# Patient Record
Sex: Male | Born: 1965
Health system: Southern US, Community
[De-identification: ages and names within clinical notes are randomized; demographics above are authoritative.]

## PROBLEM LIST (undated history)

## (undated) DIAGNOSIS — D132 Benign neoplasm of duodenum: Secondary | ICD-10-CM

## (undated) DIAGNOSIS — Z9989 Dependence on other enabling machines and devices: Secondary | ICD-10-CM

## (undated) DIAGNOSIS — I639 Cerebral infarction, unspecified: Secondary | ICD-10-CM

## (undated) DIAGNOSIS — K219 Gastro-esophageal reflux disease without esophagitis: Secondary | ICD-10-CM

## (undated) DIAGNOSIS — F32A Depression, unspecified: Secondary | ICD-10-CM

## (undated) DIAGNOSIS — M1990 Unspecified osteoarthritis, unspecified site: Secondary | ICD-10-CM

## (undated) DIAGNOSIS — F419 Anxiety disorder, unspecified: Secondary | ICD-10-CM

## (undated) DIAGNOSIS — F329 Major depressive disorder, single episode, unspecified: Secondary | ICD-10-CM

## (undated) DIAGNOSIS — J45909 Unspecified asthma, uncomplicated: Secondary | ICD-10-CM

## (undated) DIAGNOSIS — R519 Headache, unspecified: Secondary | ICD-10-CM

## (undated) DIAGNOSIS — R7303 Prediabetes: Secondary | ICD-10-CM

## (undated) DIAGNOSIS — I10 Essential (primary) hypertension: Secondary | ICD-10-CM

## (undated) DIAGNOSIS — J189 Pneumonia, unspecified organism: Secondary | ICD-10-CM

## (undated) DIAGNOSIS — K759 Inflammatory liver disease, unspecified: Secondary | ICD-10-CM

## (undated) DIAGNOSIS — M19172 Post-traumatic osteoarthritis, left ankle and foot: Secondary | ICD-10-CM

## (undated) DIAGNOSIS — F209 Schizophrenia, unspecified: Secondary | ICD-10-CM

## (undated) DIAGNOSIS — Z96651 Presence of right artificial knee joint: Secondary | ICD-10-CM

## (undated) HISTORY — PX: JOINT REPLACEMENT: SHX530

## (undated) HISTORY — PX: UPPER GASTROINTESTINAL ENDOSCOPY: SHX188

## (undated) HISTORY — DX: Benign neoplasm of duodenum: D13.2

## (undated) HISTORY — PX: BACK SURGERY: SHX140

## (undated) NOTE — *Deleted (*Deleted)
[facility]  Date of Admission:  01/06/2020      Total days of antibiotics ***            ASSESSMENT: [name] is a 22 y.o. male with    PLAN: 1. ***  Principal Problem:   Acute osteomyelitis of lumbar spine (HCC) Active Problems:   Cocaine abuse (HCC)   Benign essential HTN   History of CVA (cerebrovascular accident)   Chronic pain syndrome   Closed fracture of one rib of left side   Discitis of lumbar region   . benztropine  1 mg Oral Daily  . buPROPion  300 mg Oral Daily  . cyclobenzaprine  5 mg Oral TID  . ferrous sulfate  325 mg Oral Q breakfast  . gabapentin  300 mg Oral BID  . losartan  100 mg Oral Daily  . mirtazapine  15 mg Oral QHS  . pantoprazole  40 mg Oral Daily  . sodium chloride flush  10-40 mL Intracatheter Q12H  . sodium chloride flush  3 mL Intravenous Q12H  . vitamin B-12  100 mcg Oral Daily    SUBJECTIVE: Still with bad back pain.    Review of Systems: ROS  Allergies  Allergen Reactions  . Chlorhexidine Itching and Rash    OBJECTIVE: Vitals:   01/10/20 1726 01/10/20 2120 01/11/20 0457 01/11/20 0922  BP: (!) 133/92 121/85 (!) 125/91 (!) 131/92  Pulse: (!) 110 (!) 105 (!) 101 100  Resp: 20 18 18 19   Temp: 99.1 F (37.3 C) 98.2 F (36.8 C) 97.9 F (36.6 C) 98.4 F (36.9 C)  TempSrc: Oral Oral Oral Oral  SpO2: 100% 98% 99% 99%  Weight:      Height:       Body mass index is 27.78 kg/m.  Physical Exam  Lab Results Lab Results  Component Value Date   WBC 7.5 01/10/2020   HGB 10.8 (L) 01/10/2020   HCT 33.6 (L) 01/10/2020   MCV 83.4 01/10/2020   PLT 397 01/10/2020    Lab Results  Component Value Date   CREATININE 0.79 01/10/2020   BUN 8 01/10/2020   NA 135 01/10/2020   K 3.7 01/10/2020   CL 102 01/10/2020   CO2 24 01/10/2020    Lab Results  Component Value Date   ALT 8 01/07/2020   AST 29 01/07/2020   ALKPHOS 98 01/07/2020   BILITOT 0.8 01/07/2020      Microbiology: Recent Results (from the past 240 hour(s))  Respiratory Panel by RT PCR (Flu A&B, Covid) - Nasopharyngeal Swab     Status: None   Collection Time: 01/07/20  3:09 AM   Specimen: Nasopharyngeal Swab  Result Value Ref Range Status   SARS Coronavirus 2 by RT PCR NEGATIVE NEGATIVE Final    Comment: (NOTE) SARS-CoV-2 target nucleic acids are NOT DETECTED.  The SARS-CoV-2 RNA is generally detectable in upper respiratoy specimens during the acute phase of infection. The lowest concentration of SARS-CoV-2 viral copies this assay can detect is 131 copies/mL. A negative result does not preclude SARS-Cov-2 infection and should not be used as the sole basis for treatment or other patient management decisions. A negative result may occur with  improper specimen collection/handling, submission of specimen other than nasopharyngeal swab, presence of viral mutation(s) within the areas targeted by this assay, and inadequate number of viral copies (<131 copies/mL). A negative result must be combined with clinical observations, patient history,  and epidemiological information. The expected result is Negative.  Fact Sheet for Patients:  https://www.moore.com/  Fact Sheet for Healthcare Providers:  https://www.young.biz/  This test is no t yet approved or cleared by the [place] FDA and  has been authorized for detection and/or diagnosis of SARS-CoV-2 by FDA under an Emergency Use Authorization (EUA). This EUA will remain  in effect (meaning this test can be used) for the duration of the COVID-19 declaration under Section 564(b)(1) of the Act, 21 U.S.C. section 360bbb-3(b)(1), unless the authorization is terminated or revoked sooner.     Influenza A by PCR NEGATIVE NEGATIVE Final   Influenza B by PCR NEGATIVE NEGATIVE Final    Comment: (NOTE) The Xpert Xpress SARS-CoV-2/FLU/RSV assay is intended as an aid in  the diagnosis of influenza  from Nasopharyngeal swab specimens and  should not be used as a sole basis for treatment. Nasal washings and  aspirates are unacceptable for Xpert Xpress SARS-CoV-2/FLU/RSV  testing.  Fact Sheet for Patients: https://www.moore.com/  Fact Sheet for Healthcare Providers: https://www.young.biz/  This test is not yet approved or cleared by the [place] FDA and  has been authorized for detection and/or diagnosis of SARS-CoV-2 by  FDA under an Emergency Use Authorization (EUA). This EUA will remain  in effect (meaning this test can be used) for the duration of the  Covid-19 declaration under Section 564(b)(1) of the Act, 21  U.S.C. section 360bbb-3(b)(1), unless the authorization is  terminated or revoked. Performed at [facility], [address], [place], [place] [address]   Culture, blood (routine x 2)     Status: None (Preliminary result)   Collection Time: 01/08/20  9:10 AM   Specimen: BLOOD  Result Value Ref Range Status   Specimen Description BLOOD RIGHT ANTECUBITAL  Final   Special Requests   Final    BOTTLES DRAWN AEROBIC AND ANAEROBIC Blood Culture adequate volume   Culture   Final    NO GROWTH 2 DAYS Performed at [facility], [address], [place], [place] [address]    Report Status PENDING  Incomplete  Culture, blood (routine x 2)     Status: None (Preliminary result)   Collection Time: 01/08/20  9:13 AM   Specimen: BLOOD  Result Value Ref Range Status   Specimen Description BLOOD LEFT ANTECUBITAL  Final   Special Requests   Final    BOTTLES DRAWN AEROBIC AND ANAEROBIC Blood Culture adequate volume   Culture   Final    NO GROWTH 2 DAYS Performed at [facility], [address] [place], [place] [address]    Report Status PENDING  Incomplete  Body fluid culture     Status: None (Preliminary result)   Collection Time: 01/08/20  3:01 PM   Specimen: Body Fluid  Result Value Ref Range Status   Specimen  Description FLUID  Final   Special Requests DISC L1  Final   Gram Stain   Final    ABUNDANT WBC PRESENT,BOTH PMN AND MONONUCLEAR NO ORGANISMS SEEN    Culture   Final    NO GROWTH 3 DAYS Performed at [facility], [address], [place], [place] [address]    Report Status PENDING  Incomplete  Anaerobic culture     Status: None (Preliminary result)   Collection Time: 01/08/20  3:01 PM   Specimen: Fluid  Result Value Ref Range Status   Specimen Description FLUID  Final   Special Requests   Final  DISC L1 Performed at [facility], [address], [place], [place] [address]    Culture   Final    NO ANAEROBES ISOLATED; CULTURE IN PROGRESS FOR 5 DAYS   Report Status PENDING  Incomplete    Rexene Alberts, MSN, NP-C [facility]  [email or web address] Pager: [phone] Office: [phone] [facility] Main Line: [phone]

---
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
## 2007-10-22 ENCOUNTER — Inpatient Hospital Stay (HOSPITAL_COMMUNITY): Admission: EM | Admit: 2007-10-22 | Discharge: 2007-10-23 | Payer: Self-pay | Admitting: Emergency Medicine

## 2007-11-04 ENCOUNTER — Emergency Department (HOSPITAL_COMMUNITY): Admission: EM | Admit: 2007-11-04 | Discharge: 2007-11-05 | Payer: Self-pay | Admitting: Emergency Medicine

## 2007-12-11 ENCOUNTER — Emergency Department (HOSPITAL_COMMUNITY): Admission: EM | Admit: 2007-12-11 | Discharge: 2007-12-11 | Payer: Self-pay | Admitting: Emergency Medicine

## 2007-12-23 ENCOUNTER — Emergency Department (HOSPITAL_COMMUNITY): Admission: EM | Admit: 2007-12-23 | Discharge: 2007-12-24 | Payer: Self-pay | Admitting: Emergency Medicine

## 2008-01-05 ENCOUNTER — Emergency Department (HOSPITAL_COMMUNITY): Admission: EM | Admit: 2008-01-05 | Discharge: 2008-01-05 | Payer: Self-pay | Admitting: Emergency Medicine

## 2008-03-03 ENCOUNTER — Other Ambulatory Visit: Payer: Self-pay | Admitting: Emergency Medicine

## 2008-03-03 ENCOUNTER — Other Ambulatory Visit: Payer: Self-pay

## 2008-03-03 ENCOUNTER — Ambulatory Visit: Payer: Self-pay | Admitting: Psychiatry

## 2008-03-03 ENCOUNTER — Inpatient Hospital Stay (HOSPITAL_COMMUNITY): Admission: AD | Admit: 2008-03-03 | Discharge: 2008-03-05 | Payer: Self-pay | Admitting: Psychiatry

## 2008-03-10 ENCOUNTER — Other Ambulatory Visit: Payer: Self-pay

## 2008-03-11 ENCOUNTER — Inpatient Hospital Stay (HOSPITAL_COMMUNITY): Admission: AD | Admit: 2008-03-11 | Discharge: 2008-03-11 | Payer: Self-pay | Admitting: Psychiatry

## 2008-03-13 ENCOUNTER — Emergency Department (HOSPITAL_COMMUNITY): Admission: EM | Admit: 2008-03-13 | Discharge: 2008-03-13 | Payer: Self-pay | Admitting: Emergency Medicine

## 2008-03-27 ENCOUNTER — Emergency Department (HOSPITAL_COMMUNITY): Admission: EM | Admit: 2008-03-27 | Discharge: 2008-03-27 | Payer: Self-pay | Admitting: Emergency Medicine

## 2008-04-15 ENCOUNTER — Other Ambulatory Visit: Payer: Self-pay | Admitting: Emergency Medicine

## 2008-04-16 ENCOUNTER — Other Ambulatory Visit: Payer: Self-pay | Admitting: Emergency Medicine

## 2008-04-16 ENCOUNTER — Inpatient Hospital Stay (HOSPITAL_COMMUNITY): Admission: AD | Admit: 2008-04-16 | Discharge: 2008-04-26 | Payer: Self-pay | Admitting: Psychiatry

## 2008-04-16 ENCOUNTER — Ambulatory Visit: Payer: Self-pay | Admitting: Psychiatry

## 2008-04-23 ENCOUNTER — Encounter: Payer: Self-pay | Admitting: Emergency Medicine

## 2008-05-04 ENCOUNTER — Other Ambulatory Visit: Payer: Self-pay | Admitting: *Deleted

## 2008-05-04 ENCOUNTER — Inpatient Hospital Stay (HOSPITAL_COMMUNITY): Admission: AD | Admit: 2008-05-04 | Discharge: 2008-05-18 | Payer: Self-pay | Admitting: Psychiatry

## 2008-05-10 ENCOUNTER — Encounter: Payer: Self-pay | Admitting: Emergency Medicine

## 2008-05-11 ENCOUNTER — Other Ambulatory Visit: Payer: Self-pay | Admitting: Emergency Medicine

## 2008-05-12 ENCOUNTER — Other Ambulatory Visit: Payer: Self-pay | Admitting: Emergency Medicine

## 2008-05-22 ENCOUNTER — Emergency Department (HOSPITAL_BASED_OUTPATIENT_CLINIC_OR_DEPARTMENT_OTHER): Admission: EM | Admit: 2008-05-22 | Discharge: 2008-05-22 | Payer: Self-pay | Admitting: Emergency Medicine

## 2008-08-05 ENCOUNTER — Ambulatory Visit: Payer: Self-pay | Admitting: Internal Medicine

## 2008-08-06 ENCOUNTER — Ambulatory Visit: Payer: Self-pay | Admitting: Internal Medicine

## 2008-08-06 ENCOUNTER — Inpatient Hospital Stay (HOSPITAL_COMMUNITY): Admission: EM | Admit: 2008-08-06 | Discharge: 2008-08-09 | Payer: Self-pay | Admitting: Emergency Medicine

## 2008-08-07 ENCOUNTER — Encounter (INDEPENDENT_AMBULATORY_CARE_PROVIDER_SITE_OTHER): Payer: Self-pay | Admitting: Internal Medicine

## 2008-08-07 ENCOUNTER — Encounter: Payer: Self-pay | Admitting: Internal Medicine

## 2009-05-06 ENCOUNTER — Emergency Department (HOSPITAL_COMMUNITY): Admission: EM | Admit: 2009-05-06 | Discharge: 2009-05-07 | Payer: Self-pay | Admitting: Emergency Medicine

## 2009-06-05 ENCOUNTER — Emergency Department (HOSPITAL_COMMUNITY): Admission: EM | Admit: 2009-06-05 | Discharge: 2009-06-05 | Payer: Self-pay | Admitting: Family Medicine

## 2009-06-29 ENCOUNTER — Emergency Department (HOSPITAL_COMMUNITY): Admission: EM | Admit: 2009-06-29 | Discharge: 2009-06-30 | Payer: Self-pay | Admitting: Emergency Medicine

## 2009-08-10 ENCOUNTER — Emergency Department (HOSPITAL_COMMUNITY): Admission: EM | Admit: 2009-08-10 | Discharge: 2009-08-11 | Payer: Self-pay | Admitting: Emergency Medicine

## 2009-08-21 ENCOUNTER — Emergency Department (HOSPITAL_COMMUNITY): Admission: EM | Admit: 2009-08-21 | Discharge: 2009-08-22 | Payer: Self-pay | Admitting: Emergency Medicine

## 2009-08-22 ENCOUNTER — Emergency Department (HOSPITAL_COMMUNITY): Admission: EM | Admit: 2009-08-22 | Discharge: 2009-08-22 | Payer: Self-pay | Admitting: Emergency Medicine

## 2009-08-27 ENCOUNTER — Emergency Department (HOSPITAL_COMMUNITY): Admission: EM | Admit: 2009-08-27 | Discharge: 2009-08-27 | Payer: Self-pay | Admitting: Family Medicine

## 2009-08-27 ENCOUNTER — Emergency Department (HOSPITAL_COMMUNITY): Admission: EM | Admit: 2009-08-27 | Discharge: 2009-08-28 | Payer: Self-pay | Admitting: Emergency Medicine

## 2009-10-14 ENCOUNTER — Emergency Department (HOSPITAL_COMMUNITY): Admission: EM | Admit: 2009-10-14 | Discharge: 2009-10-14 | Payer: Self-pay | Admitting: Emergency Medicine

## 2009-11-15 ENCOUNTER — Emergency Department (HOSPITAL_COMMUNITY): Admission: EM | Admit: 2009-11-15 | Discharge: 2009-11-15 | Payer: Self-pay | Admitting: Emergency Medicine

## 2009-12-10 ENCOUNTER — Emergency Department (HOSPITAL_COMMUNITY): Admission: EM | Admit: 2009-12-10 | Discharge: 2009-12-10 | Payer: Self-pay | Admitting: Emergency Medicine

## 2009-12-14 ENCOUNTER — Emergency Department (HOSPITAL_COMMUNITY): Admission: EM | Admit: 2009-12-14 | Discharge: 2009-12-14 | Payer: Self-pay | Admitting: Emergency Medicine

## 2010-02-28 ENCOUNTER — Inpatient Hospital Stay (HOSPITAL_COMMUNITY)
Admission: EM | Admit: 2010-02-28 | Discharge: 2010-03-03 | Disposition: A | Discharge status: Other Acute Inpatient Hospital | Payer: Self-pay | Source: Home / Self Care | Attending: Internal Medicine | Admitting: Internal Medicine

## 2010-03-06 ENCOUNTER — Emergency Department (HOSPITAL_COMMUNITY): Admission: EM | Admit: 2010-03-06 | Discharge: 2009-09-12 | Payer: Self-pay | Admitting: Emergency Medicine

## 2010-04-22 ENCOUNTER — Emergency Department (HOSPITAL_COMMUNITY)
Admission: EM | Admit: 2010-04-22 | Discharge: 2010-04-23 | Payer: Self-pay | Source: Home / Self Care | Admitting: Emergency Medicine

## 2010-04-23 LAB — DIFFERENTIAL
Basophils Absolute: 0 10*3/uL (ref 0.0–0.1)
Basophils Relative: 1 % (ref 0–1)
Eosinophils Absolute: 0.3 10*3/uL (ref 0.0–0.7)
Eosinophils Relative: 6 % — ABNORMAL HIGH (ref 0–5)
Monocytes Absolute: 0.4 10*3/uL (ref 0.1–1.0)
Monocytes Relative: 8 % (ref 3–12)
Neutro Abs: 2.5 10*3/uL (ref 1.7–7.7)

## 2010-04-23 LAB — COMPREHENSIVE METABOLIC PANEL
ALT: <8 U/L (ref 0–53)
AST: 19 U/L (ref 0–37)
Albumin: 3.4 g/dL — ABNORMAL LOW (ref 3.5–5.2)
CO2: 21 mEq/L (ref 19–32)
Calcium: 8.4 mg/dL (ref 8.4–10.5)
Creatinine, Ser: 1.01 mg/dL (ref 0.4–1.5)
GFR calc Af Amer: >60 mL/min (ref >60)
GFR calc non Af Amer: >60 mL/min (ref >60)
Sodium: 135 mEq/L (ref 135–145)
Total Protein: 5.9 g/dL — ABNORMAL LOW (ref 6.0–8.3)

## 2010-04-23 LAB — CK TOTAL AND CKMB (NOT AT ARMC)
CK, MB: 4.9 ng/mL — ABNORMAL HIGH (ref 0.3–4.0)
Relative Index: 1.7 (ref 0.0–2.5)

## 2010-04-23 LAB — CBC
MCH: 31.2 pg (ref 26.0–34.0)
MCHC: 34.1 g/dL (ref 30.0–36.0)
Platelets: 303 10*3/uL (ref 150–400)
RDW: 15 % (ref 11.5–15.5)

## 2010-04-23 LAB — RAPID URINE DRUG SCREEN, HOSP PERFORMED
Opiates: NOT DETECTED
Tetrahydrocannabinol: NOT DETECTED

## 2010-04-23 LAB — TROPONIN I: Troponin I: 0.01 ng/mL (ref 0.00–0.06)

## 2010-04-25 ENCOUNTER — Emergency Department (HOSPITAL_COMMUNITY)
Admission: EM | Admit: 2010-04-25 | Discharge: 2010-04-26 | Payer: Self-pay | Source: Home / Self Care | Admitting: Emergency Medicine

## 2010-04-25 LAB — PROTIME-INR
INR: 0.93 (ref 0.00–1.49)
Prothrombin Time: 12.7 seconds (ref 11.6–15.2)

## 2010-04-25 LAB — CBC
HCT: 45.1 % (ref 39.0–52.0)
Hemoglobin: 15.8 g/dL (ref 13.0–17.0)
MCH: 31.7 pg (ref 26.0–34.0)
RBC: 4.98 MIL/uL (ref 4.22–5.81)

## 2010-04-25 LAB — COMPREHENSIVE METABOLIC PANEL
ALT: <8 U/L (ref 0–53)
AST: 31 U/L (ref 0–37)
Alkaline Phosphatase: 73 U/L (ref 39–117)
CO2: 23 mEq/L (ref 19–32)
Calcium: 8.4 mg/dL (ref 8.4–10.5)
GFR calc Af Amer: >60 mL/min (ref >60)
Potassium: 3.8 mEq/L (ref 3.5–5.1)
Sodium: 135 mEq/L (ref 135–145)
Total Protein: 7.5 g/dL (ref 6.0–8.3)

## 2010-04-25 LAB — DIFFERENTIAL
Basophils Absolute: 0.1 10*3/uL (ref 0.0–0.1)
Basophils Relative: 1 % (ref 0–1)
Lymphocytes Relative: 19 % (ref 12–46)
Monocytes Relative: 4 % (ref 3–12)
Neutro Abs: 6.3 10*3/uL (ref 1.7–7.7)
Neutrophils Relative %: 75 % (ref 43–77)

## 2010-04-25 LAB — URINALYSIS, ROUTINE W REFLEX MICROSCOPIC
Ketones, ur: NEGATIVE mg/dL
Nitrite: NEGATIVE
Protein, ur: NEGATIVE mg/dL
Urobilinogen, UA: 0.2 mg/dL (ref 0.0–1.0)

## 2010-04-25 LAB — RAPID URINE DRUG SCREEN, HOSP PERFORMED
Benzodiazepines: NOT DETECTED
Cocaine: NOT DETECTED
Tetrahydrocannabinol: NOT DETECTED

## 2010-05-08 ENCOUNTER — Inpatient Hospital Stay (INDEPENDENT_AMBULATORY_CARE_PROVIDER_SITE_OTHER)
Admission: RE | Admit: 2010-05-08 | Discharge: 2010-05-08 | Disposition: A | Discharge status: Home / Self Care | Payer: Medicare FFS | Source: Ambulatory Visit | Attending: Family Medicine | Admitting: Family Medicine

## 2010-05-08 DIAGNOSIS — S0180XA Unspecified open wound of other part of head, initial encounter: Secondary | ICD-10-CM

## 2010-05-15 ENCOUNTER — Encounter (HOSPITAL_COMMUNITY): Payer: Self-pay | Admitting: Radiology

## 2010-05-15 ENCOUNTER — Emergency Department (HOSPITAL_COMMUNITY): Payer: Medicare Other

## 2010-05-15 ENCOUNTER — Emergency Department (HOSPITAL_COMMUNITY)
Admission: EM | Admit: 2010-05-15 | Discharge: 2010-05-15 | Disposition: A | Discharge status: Home / Self Care | Payer: Medicare Other | Attending: Emergency Medicine | Admitting: Emergency Medicine

## 2010-05-15 DIAGNOSIS — S0990XA Unspecified injury of head, initial encounter: Secondary | ICD-10-CM | POA: Insufficient documentation

## 2010-05-15 DIAGNOSIS — Z8673 Personal history of transient ischemic attack (TIA), and cerebral infarction without residual deficits: Secondary | ICD-10-CM | POA: Insufficient documentation

## 2010-05-15 DIAGNOSIS — R29898 Other symptoms and signs involving the musculoskeletal system: Secondary | ICD-10-CM | POA: Insufficient documentation

## 2010-05-15 DIAGNOSIS — W19XXXA Unspecified fall, initial encounter: Secondary | ICD-10-CM | POA: Insufficient documentation

## 2010-05-15 DIAGNOSIS — I10 Essential (primary) hypertension: Secondary | ICD-10-CM | POA: Insufficient documentation

## 2010-05-15 DIAGNOSIS — Z8659 Personal history of other mental and behavioral disorders: Secondary | ICD-10-CM | POA: Insufficient documentation

## 2010-05-15 DIAGNOSIS — H6120 Impacted cerumen, unspecified ear: Secondary | ICD-10-CM | POA: Insufficient documentation

## 2010-05-15 DIAGNOSIS — T148XXA Other injury of unspecified body region, initial encounter: Secondary | ICD-10-CM | POA: Insufficient documentation

## 2010-05-15 DIAGNOSIS — F1010 Alcohol abuse, uncomplicated: Secondary | ICD-10-CM | POA: Insufficient documentation

## 2010-05-15 DIAGNOSIS — Z79899 Other long term (current) drug therapy: Secondary | ICD-10-CM | POA: Insufficient documentation

## 2010-05-15 DIAGNOSIS — J329 Chronic sinusitis, unspecified: Secondary | ICD-10-CM | POA: Insufficient documentation

## 2010-05-15 DIAGNOSIS — Y9210 Unspecified residential institution as the place of occurrence of the external cause: Secondary | ICD-10-CM | POA: Insufficient documentation

## 2010-05-15 DIAGNOSIS — F3130 Bipolar disorder, current episode depressed, mild or moderate severity, unspecified: Secondary | ICD-10-CM | POA: Insufficient documentation

## 2010-05-15 HISTORY — DX: Essential (primary) hypertension: I10

## 2010-05-15 HISTORY — DX: Cerebral infarction, unspecified: I63.9

## 2010-05-15 LAB — CBC
Hemoglobin: 13.3 g/dL (ref 13.0–17.0)
MCH: 31 pg (ref 26.0–34.0)
MCHC: 33.8 g/dL (ref 30.0–36.0)
RDW: 15 % (ref 11.5–15.5)

## 2010-05-15 LAB — DIFFERENTIAL
Basophils Relative: 0 % (ref 0–1)
Eosinophils Absolute: 0.1 10*3/uL (ref 0.0–0.7)
Eosinophils Relative: 3 % (ref 0–5)
Monocytes Absolute: 0.2 10*3/uL (ref 0.1–1.0)
Monocytes Relative: 5 % (ref 3–12)
Neutro Abs: 3.4 10*3/uL (ref 1.7–7.7)

## 2010-05-15 LAB — COMPREHENSIVE METABOLIC PANEL
AST: 28 U/L (ref 0–37)
CO2: 20 mEq/L (ref 19–32)
Calcium: 8.1 mg/dL — ABNORMAL LOW (ref 8.4–10.5)
Creatinine, Ser: 0.74 mg/dL (ref 0.4–1.5)
GFR calc Af Amer: >60 mL/min (ref >60)
GFR calc non Af Amer: >60 mL/min (ref >60)

## 2010-05-15 LAB — GLUCOSE, CAPILLARY

## 2010-05-15 LAB — ETHANOL: Alcohol, Ethyl (B): 281 mg/dL — ABNORMAL HIGH (ref 0–10)

## 2010-05-26 ENCOUNTER — Other Ambulatory Visit (HOSPITAL_COMMUNITY): Payer: Self-pay | Admitting: Family Medicine

## 2010-05-26 DIAGNOSIS — D381 Neoplasm of uncertain behavior of trachea, bronchus and lung: Secondary | ICD-10-CM

## 2010-05-28 ENCOUNTER — Ambulatory Visit (HOSPITAL_COMMUNITY): Admission: RE | Admit: 2010-05-28 | Payer: Medicare FFS | Source: Ambulatory Visit

## 2010-06-10 LAB — CBC
MCH: 31.1 pg (ref 26.0–34.0)
MCHC: 33.9 g/dL (ref 30.0–36.0)
MCV: 90.6 fL (ref 78.0–100.0)
MCV: 91.9 fL (ref 78.0–100.0)
Platelets: 254 10*3/uL (ref 150–400)
Platelets: 263 10*3/uL (ref 150–400)
RBC: 4.08 MIL/uL — ABNORMAL LOW (ref 4.22–5.81)
RBC: 4.24 MIL/uL (ref 4.22–5.81)
RDW: 15 % (ref 11.5–15.5)
WBC: 4.9 10*3/uL (ref 4.0–10.5)

## 2010-06-10 LAB — COMPREHENSIVE METABOLIC PANEL
ALT: 11 U/L (ref 0–53)
ALT: 12 U/L (ref 0–53)
AST: 33 U/L (ref 0–37)
AST: 47 U/L — ABNORMAL HIGH (ref 0–37)
Albumin: 3.5 g/dL (ref 3.5–5.2)
Albumin: 3.6 g/dL (ref 3.5–5.2)
Albumin: 3.8 g/dL (ref 3.5–5.2)
Alkaline Phosphatase: 67 U/L (ref 39–117)
BUN: 8 mg/dL (ref 6–23)
BUN: 9 mg/dL (ref 6–23)
CO2: 23 mEq/L (ref 19–32)
Calcium: 8.4 mg/dL (ref 8.4–10.5)
Calcium: 8.8 mg/dL (ref 8.4–10.5)
Chloride: 105 mEq/L (ref 96–112)
Chloride: 111 mEq/L (ref 96–112)
Creatinine, Ser: 0.71 mg/dL (ref 0.4–1.5)
Creatinine, Ser: 0.82 mg/dL (ref 0.4–1.5)
GFR calc Af Amer: >60 mL/min (ref >60)
GFR calc Af Amer: >60 mL/min (ref >60)
GFR calc Af Amer: >60 mL/min (ref >60)
GFR calc non Af Amer: >60 mL/min (ref >60)
Potassium: 3.7 mEq/L (ref 3.5–5.1)
Sodium: 138 mEq/L (ref 135–145)
Sodium: 141 mEq/L (ref 135–145)
Total Bilirubin: 0.4 mg/dL (ref 0.3–1.2)
Total Bilirubin: 0.4 mg/dL (ref 0.3–1.2)
Total Protein: 6.5 g/dL (ref 6.0–8.3)

## 2010-06-10 LAB — URINALYSIS, ROUTINE W REFLEX MICROSCOPIC
Bilirubin Urine: NEGATIVE
Ketones, ur: NEGATIVE mg/dL
Nitrite: NEGATIVE
Specific Gravity, Urine: 1.005 (ref 1.005–1.030)
Urobilinogen, UA: 0.2 mg/dL (ref 0.0–1.0)

## 2010-06-10 LAB — DIFFERENTIAL
Basophils Absolute: 0 10*3/uL (ref 0.0–0.1)
Basophils Relative: 1 % (ref 0–1)
Eosinophils Relative: 4 % (ref 0–5)
Monocytes Absolute: 0.4 10*3/uL (ref 0.1–1.0)
Monocytes Relative: 8 % (ref 3–12)
Neutro Abs: 2.6 10*3/uL (ref 1.7–7.7)

## 2010-06-10 LAB — ETHANOL
Alcohol, Ethyl (B): 446 mg/dL (ref 0–10)
Alcohol, Ethyl (B): 5 mg/dL (ref 0–10)

## 2010-06-10 LAB — RAPID URINE DRUG SCREEN, HOSP PERFORMED: Tetrahydrocannabinol: NOT DETECTED

## 2010-06-10 LAB — TRICYCLICS SCREEN, URINE: TCA Scrn: NOT DETECTED

## 2010-06-10 LAB — PHOSPHORUS: Phosphorus: 5.4 mg/dL — ABNORMAL HIGH (ref 2.3–4.6)

## 2010-06-10 LAB — ACETAMINOPHEN LEVEL: Acetaminophen (Tylenol), Serum: <10 ug/mL — ABNORMAL LOW (ref 10–30)

## 2010-06-12 LAB — RAPID URINE DRUG SCREEN, HOSP PERFORMED
Barbiturates: NOT DETECTED
Barbiturates: NOT DETECTED
Benzodiazepines: NOT DETECTED
Cocaine: NOT DETECTED
Opiates: NOT DETECTED
Opiates: NOT DETECTED

## 2010-06-12 LAB — DIFFERENTIAL
Basophils Relative: 1 % (ref 0–1)
Basophils Relative: 1 % (ref 0–1)
Eosinophils Absolute: 0.2 10*3/uL (ref 0.0–0.7)
Eosinophils Absolute: 0.2 10*3/uL (ref 0.0–0.7)
Lymphs Abs: 1.6 10*3/uL (ref 0.7–4.0)
Monocytes Absolute: 0.4 10*3/uL (ref 0.1–1.0)
Monocytes Relative: 6 % (ref 3–12)
Neutrophils Relative %: 62 % (ref 43–77)

## 2010-06-12 LAB — CBC
HCT: 39 % (ref 39.0–52.0)
Hemoglobin: 13.5 g/dL (ref 13.0–17.0)
MCH: 32.5 pg (ref 26.0–34.0)
MCH: 32.5 pg (ref 26.0–34.0)
MCHC: 34.3 g/dL (ref 30.0–36.0)
MCHC: 34.5 g/dL (ref 30.0–36.0)
Platelets: 226 10*3/uL (ref 150–400)
RBC: 4.08 MIL/uL — ABNORMAL LOW (ref 4.22–5.81)

## 2010-06-12 LAB — COMPREHENSIVE METABOLIC PANEL
Albumin: 4.2 g/dL (ref 3.5–5.2)
Alkaline Phosphatase: 71 U/L (ref 39–117)
BUN: 5 mg/dL — ABNORMAL LOW (ref 6–23)
CO2: 23 mEq/L (ref 19–32)
Chloride: 104 mEq/L (ref 96–112)
GFR calc non Af Amer: >60 mL/min (ref >60)
Potassium: 4.1 mEq/L (ref 3.5–5.1)
Total Bilirubin: 0.2 mg/dL — ABNORMAL LOW (ref 0.3–1.2)

## 2010-06-12 LAB — URINALYSIS, ROUTINE W REFLEX MICROSCOPIC
Bilirubin Urine: NEGATIVE
Glucose, UA: NEGATIVE mg/dL
Hgb urine dipstick: NEGATIVE
Ketones, ur: NEGATIVE mg/dL
Protein, ur: NEGATIVE mg/dL

## 2010-06-12 LAB — POCT I-STAT, CHEM 8
Calcium, Ion: 1.02 mmol/L — ABNORMAL LOW (ref 1.12–1.32)
Chloride: 106 mEq/L (ref 96–112)
Glucose, Bld: 118 mg/dL — ABNORMAL HIGH (ref 70–99)
HCT: 41 % (ref 39.0–52.0)
Hemoglobin: 13.9 g/dL (ref 13.0–17.0)

## 2010-06-12 LAB — ETHANOL: Alcohol, Ethyl (B): 164 mg/dL — ABNORMAL HIGH (ref 0–10)

## 2010-06-12 LAB — BASIC METABOLIC PANEL
CO2: 21 mEq/L (ref 19–32)
Glucose, Bld: 100 mg/dL — ABNORMAL HIGH (ref 70–99)
Potassium: 4 mEq/L (ref 3.5–5.1)
Sodium: 138 mEq/L (ref 135–145)

## 2010-06-13 LAB — CBC
HCT: 34.5 % — ABNORMAL LOW (ref 39.0–52.0)
Hemoglobin: 12 g/dL — ABNORMAL LOW (ref 13.0–17.0)
MCH: 32.2 pg (ref 26.0–34.0)
MCHC: 34.8 g/dL (ref 30.0–36.0)
RBC: 3.73 MIL/uL — ABNORMAL LOW (ref 4.22–5.81)

## 2010-06-13 LAB — TROPONIN I: Troponin I: 0.01 ng/mL (ref 0.00–0.06)

## 2010-06-13 LAB — DIFFERENTIAL
Basophils Absolute: 0 10*3/uL (ref 0.0–0.1)
Eosinophils Absolute: 0.1 10*3/uL (ref 0.0–0.7)
Eosinophils Relative: 1 % (ref 0–5)
Lymphocytes Relative: 25 % (ref 12–46)
Lymphs Abs: 1.6 10*3/uL (ref 0.7–4.0)
Neutrophils Relative %: 69 % (ref 43–77)

## 2010-06-13 LAB — URINALYSIS, ROUTINE W REFLEX MICROSCOPIC
Bilirubin Urine: NEGATIVE
Hgb urine dipstick: NEGATIVE
Ketones, ur: NEGATIVE mg/dL
Nitrite: NEGATIVE
pH: 5.5 (ref 5.0–8.0)

## 2010-06-13 LAB — COMPREHENSIVE METABOLIC PANEL
ALT: 14 U/L (ref 0–53)
AST: 31 U/L (ref 0–37)
CO2: 23 mEq/L (ref 19–32)
Calcium: 7.8 mg/dL — ABNORMAL LOW (ref 8.4–10.5)
Chloride: 106 mEq/L (ref 96–112)
Creatinine, Ser: 1.17 mg/dL (ref 0.4–1.5)
GFR calc non Af Amer: >60 mL/min (ref >60)
Glucose, Bld: 153 mg/dL — ABNORMAL HIGH (ref 70–99)
Total Bilirubin: 0.2 mg/dL — ABNORMAL LOW (ref 0.3–1.2)

## 2010-06-13 LAB — POCT CARDIAC MARKERS: Myoglobin, poc: 131 ng/mL (ref 12–200)

## 2010-06-13 LAB — CK TOTAL AND CKMB (NOT AT ARMC): Total CK: 240 U/L — ABNORMAL HIGH (ref 7–232)

## 2010-06-15 LAB — POCT I-STAT, CHEM 8
BUN: 10 mg/dL (ref 6–23)
Chloride: 104 mEq/L (ref 96–112)
HCT: 42 % (ref 39.0–52.0)
Potassium: 3.4 mEq/L — ABNORMAL LOW (ref 3.5–5.1)
Sodium: 136 mEq/L (ref 135–145)

## 2010-06-15 LAB — ETHANOL
Alcohol, Ethyl (B): 180 mg/dL — ABNORMAL HIGH (ref 0–10)
Alcohol, Ethyl (B): 280 mg/dL — ABNORMAL HIGH (ref 0–10)

## 2010-06-16 LAB — POCT I-STAT, CHEM 8
BUN: 4 mg/dL — ABNORMAL LOW (ref 6–23)
Calcium, Ion: 0.97 mmol/L — ABNORMAL LOW (ref 1.12–1.32)
Chloride: 108 mEq/L (ref 96–112)
HCT: 41 % (ref 39.0–52.0)
Potassium: 4.1 mEq/L (ref 3.5–5.1)
Sodium: 140 mEq/L (ref 135–145)

## 2010-06-16 LAB — POCT CARDIAC MARKERS
CKMB, poc: 6.3 ng/mL (ref 1.0–8.0)
Myoglobin, poc: 153 ng/mL (ref 12–200)
Troponin i, poc: <0.05 ng/mL (ref 0.00–0.09)

## 2010-07-08 LAB — CBC
HCT: 38.5 % — ABNORMAL LOW (ref 39.0–52.0)
Hemoglobin: 14.5 g/dL (ref 13.0–17.0)
MCHC: 33.7 g/dL (ref 30.0–36.0)
MCHC: 34 g/dL (ref 30.0–36.0)
MCHC: 34.1 g/dL (ref 30.0–36.0)
MCV: 85.4 fL (ref 78.0–100.0)
Platelets: 231 10*3/uL (ref 150–400)
Platelets: 231 10*3/uL (ref 150–400)
Platelets: 278 10*3/uL (ref 150–400)
RBC: 4.34 MIL/uL (ref 4.22–5.81)
RBC: 4.38 MIL/uL (ref 4.22–5.81)
RDW: 16.3 % — ABNORMAL HIGH (ref 11.5–15.5)
RDW: 16.8 % — ABNORMAL HIGH (ref 11.5–15.5)
WBC: 10.1 10*3/uL (ref 4.0–10.5)
WBC: 7.6 10*3/uL (ref 4.0–10.5)
WBC: 8.4 10*3/uL (ref 4.0–10.5)

## 2010-07-08 LAB — BASIC METABOLIC PANEL
BUN: 5 mg/dL — ABNORMAL LOW (ref 6–23)
BUN: 5 mg/dL — ABNORMAL LOW (ref 6–23)
CO2: 24 mEq/L (ref 19–32)
Calcium: 7.6 mg/dL — ABNORMAL LOW (ref 8.4–10.5)
Calcium: 8.3 mg/dL — ABNORMAL LOW (ref 8.4–10.5)
Calcium: 8.4 mg/dL (ref 8.4–10.5)
Calcium: 8.6 mg/dL (ref 8.4–10.5)
Chloride: 111 mEq/L (ref 96–112)
Creatinine, Ser: 0.67 mg/dL (ref 0.4–1.5)
Creatinine, Ser: 0.81 mg/dL (ref 0.4–1.5)
Creatinine, Ser: 0.83 mg/dL (ref 0.4–1.5)
Creatinine, Ser: 0.85 mg/dL (ref 0.4–1.5)
GFR calc Af Amer: >60 mL/min (ref >60)
GFR calc Af Amer: >60 mL/min (ref >60)
GFR calc Af Amer: >60 mL/min (ref >60)
GFR calc non Af Amer: >60 mL/min (ref >60)
GFR calc non Af Amer: >60 mL/min (ref >60)
Glucose, Bld: 89 mg/dL (ref 70–99)
Potassium: 4.4 mEq/L (ref 3.5–5.1)
Sodium: 139 mEq/L (ref 135–145)

## 2010-07-08 LAB — CULTURE, BLOOD (ROUTINE X 2): Culture: NO GROWTH

## 2010-07-08 LAB — CROSSMATCH
ABO/RH(D): O POS
Antibody Screen: NEGATIVE

## 2010-07-08 LAB — COMPREHENSIVE METABOLIC PANEL
ALT: 15 U/L (ref 0–53)
Albumin: 4 g/dL (ref 3.5–5.2)
Calcium: 8.6 mg/dL (ref 8.4–10.5)
Glucose, Bld: 116 mg/dL — ABNORMAL HIGH (ref 70–99)
Sodium: 140 mEq/L (ref 135–145)
Total Protein: 6.7 g/dL (ref 6.0–8.3)

## 2010-07-08 LAB — CLOTEST (H. PYLORI), BIOPSY: Helicobacter screen: POSITIVE — AB

## 2010-07-08 LAB — DIFFERENTIAL
Eosinophils Absolute: 0 10*3/uL (ref 0.0–0.7)
Lymphs Abs: 1.3 10*3/uL (ref 0.7–4.0)
Monocytes Absolute: 0.6 10*3/uL (ref 0.1–1.0)
Monocytes Relative: 5 % (ref 3–12)
Neutro Abs: 9.9 10*3/uL — ABNORMAL HIGH (ref 1.7–7.7)
Neutrophils Relative %: 84 % — ABNORMAL HIGH (ref 43–77)

## 2010-07-08 LAB — MAGNESIUM
Magnesium: 2 mg/dL (ref 1.5–2.5)
Magnesium: 2 mg/dL (ref 1.5–2.5)

## 2010-07-08 LAB — CK TOTAL AND CKMB (NOT AT ARMC)
CK, MB: 3.5 ng/mL (ref 0.3–4.0)
Relative Index: 0.6 (ref 0.0–2.5)
Relative Index: 0.7 (ref 0.0–2.5)
Relative Index: 0.7 (ref 0.0–2.5)
Total CK: 621 U/L — ABNORMAL HIGH (ref 7–232)

## 2010-07-08 LAB — ETHANOL: Alcohol, Ethyl (B): 193 mg/dL — ABNORMAL HIGH (ref 0–10)

## 2010-07-08 LAB — PROTIME-INR
INR: 1 (ref 0.00–1.49)
Prothrombin Time: 13.1 seconds (ref 11.6–15.2)

## 2010-07-08 LAB — TROPONIN I
Troponin I: 0.02 ng/mL (ref 0.00–0.06)
Troponin I: 0.05 ng/mL (ref 0.00–0.06)

## 2010-07-08 LAB — CK: Total CK: 265 U/L — ABNORMAL HIGH (ref 7–232)

## 2010-07-08 LAB — TSH: TSH: 1.459 u[IU]/mL (ref 0.350–4.500)

## 2010-07-14 LAB — COMPREHENSIVE METABOLIC PANEL
ALT: 13 U/L (ref 0–53)
AST: 49 U/L — ABNORMAL HIGH (ref 0–37)
Alkaline Phosphatase: 92 U/L (ref 39–117)
CO2: 22 mEq/L (ref 19–32)
Calcium: 8.4 mg/dL (ref 8.4–10.5)
Chloride: 105 mEq/L (ref 96–112)
GFR calc non Af Amer: >60 mL/min (ref >60)
Glucose, Bld: 95 mg/dL (ref 70–99)
Sodium: 137 mEq/L (ref 135–145)
Total Bilirubin: 0.7 mg/dL (ref 0.3–1.2)

## 2010-07-14 LAB — CBC
HCT: 41.8 % (ref 39.0–52.0)
Hemoglobin: 13.9 g/dL (ref 13.0–17.0)
Hemoglobin: 14.2 g/dL (ref 13.0–17.0)
MCHC: 32.8 g/dL (ref 30.0–36.0)
MCV: 86 fL (ref 78.0–100.0)
WBC: 8.1 10*3/uL (ref 4.0–10.5)

## 2010-07-14 LAB — URINALYSIS, ROUTINE W REFLEX MICROSCOPIC
Bilirubin Urine: NEGATIVE
Glucose, UA: NEGATIVE mg/dL
Glucose, UA: NEGATIVE mg/dL
Hgb urine dipstick: NEGATIVE
Ketones, ur: NEGATIVE mg/dL
Ketones, ur: NEGATIVE mg/dL
Nitrite: NEGATIVE
Protein, ur: NEGATIVE mg/dL
pH: 6 (ref 5.0–8.0)
pH: 6.5 (ref 5.0–8.0)

## 2010-07-14 LAB — POCT I-STAT, CHEM 8
BUN: 5 mg/dL — ABNORMAL LOW (ref 6–23)
Calcium, Ion: 1.02 mmol/L — ABNORMAL LOW (ref 1.12–1.32)
Creatinine, Ser: 0.9 mg/dL (ref 0.4–1.5)
Glucose, Bld: 90 mg/dL (ref 70–99)
Hemoglobin: 16 g/dL (ref 13.0–17.0)
TCO2: 25 mmol/L (ref 0–100)

## 2010-07-14 LAB — RAPID URINE DRUG SCREEN, HOSP PERFORMED
Amphetamines: NOT DETECTED
Barbiturates: NOT DETECTED
Benzodiazepines: NOT DETECTED
Benzodiazepines: POSITIVE — AB
Cocaine: NOT DETECTED

## 2010-07-14 LAB — DIFFERENTIAL
Basophils Absolute: 0.1 10*3/uL (ref 0.0–0.1)
Basophils Relative: 1 % (ref 0–1)
Eosinophils Absolute: 0.3 10*3/uL (ref 0.0–0.7)
Eosinophils Absolute: 0.3 10*3/uL (ref 0.0–0.7)
Eosinophils Relative: 3 % (ref 0–5)
Eosinophils Relative: 3 % (ref 0–5)
Lymphocytes Relative: 31 % (ref 12–46)
Lymphs Abs: 2.5 10*3/uL (ref 0.7–4.0)
Lymphs Abs: 2.5 10*3/uL (ref 0.7–4.0)
Monocytes Absolute: 0.5 10*3/uL (ref 0.1–1.0)
Monocytes Relative: 6 % (ref 3–12)
Neutrophils Relative %: 61 % (ref 43–77)

## 2010-07-14 LAB — ETHANOL
Alcohol, Ethyl (B): 285 mg/dL — ABNORMAL HIGH (ref 0–10)
Alcohol, Ethyl (B): 303 mg/dL — ABNORMAL HIGH (ref 0–10)

## 2010-07-15 ENCOUNTER — Emergency Department (HOSPITAL_COMMUNITY)
Admission: EM | Admit: 2010-07-15 | Discharge: 2010-07-15 | Disposition: A | Discharge status: Home / Self Care | Payer: Medicare FFS | Attending: Emergency Medicine | Admitting: Emergency Medicine

## 2010-07-15 DIAGNOSIS — Z79899 Other long term (current) drug therapy: Secondary | ICD-10-CM | POA: Insufficient documentation

## 2010-07-15 DIAGNOSIS — T1590XA Foreign body on external eye, part unspecified, unspecified eye, initial encounter: Secondary | ICD-10-CM | POA: Insufficient documentation

## 2010-07-15 DIAGNOSIS — F1010 Alcohol abuse, uncomplicated: Secondary | ICD-10-CM | POA: Insufficient documentation

## 2010-07-15 DIAGNOSIS — I10 Essential (primary) hypertension: Secondary | ICD-10-CM | POA: Insufficient documentation

## 2010-07-15 DIAGNOSIS — H11419 Vascular abnormalities of conjunctiva, unspecified eye: Secondary | ICD-10-CM | POA: Insufficient documentation

## 2010-07-15 DIAGNOSIS — H5789 Other specified disorders of eye and adnexa: Secondary | ICD-10-CM | POA: Insufficient documentation

## 2010-07-15 DIAGNOSIS — Z8673 Personal history of transient ischemic attack (TIA), and cerebral infarction without residual deficits: Secondary | ICD-10-CM | POA: Insufficient documentation

## 2010-07-15 DIAGNOSIS — Y929 Unspecified place or not applicable: Secondary | ICD-10-CM | POA: Insufficient documentation

## 2010-07-15 DIAGNOSIS — F3130 Bipolar disorder, current episode depressed, mild or moderate severity, unspecified: Secondary | ICD-10-CM | POA: Insufficient documentation

## 2010-07-15 DIAGNOSIS — S058X9A Other injuries of unspecified eye and orbit, initial encounter: Secondary | ICD-10-CM | POA: Insufficient documentation

## 2010-07-15 DIAGNOSIS — H538 Other visual disturbances: Secondary | ICD-10-CM | POA: Insufficient documentation

## 2010-07-15 DIAGNOSIS — H5710 Ocular pain, unspecified eye: Secondary | ICD-10-CM | POA: Insufficient documentation

## 2010-07-15 LAB — HEPATIC FUNCTION PANEL
ALT: 14 U/L (ref 0–53)
AST: 65 U/L — ABNORMAL HIGH (ref 0–37)
Albumin: 4 g/dL (ref 3.5–5.2)
Alkaline Phosphatase: 94 U/L (ref 39–117)
Bilirubin, Direct: <0.1 mg/dL (ref 0.0–0.3)
Total Bilirubin: 0.6 mg/dL (ref 0.3–1.2)
Total Protein: 6.5 g/dL (ref 6.0–8.3)

## 2010-07-15 LAB — URINALYSIS, ROUTINE W REFLEX MICROSCOPIC
Bilirubin Urine: NEGATIVE
Bilirubin Urine: NEGATIVE
Glucose, UA: NEGATIVE mg/dL
Glucose, UA: NEGATIVE mg/dL
Hgb urine dipstick: NEGATIVE
Hgb urine dipstick: NEGATIVE
Ketones, ur: NEGATIVE mg/dL
Nitrite: NEGATIVE
Protein, ur: NEGATIVE mg/dL
Specific Gravity, Urine: 1.001 — ABNORMAL LOW (ref 1.005–1.030)
Specific Gravity, Urine: 1.009 (ref 1.005–1.030)
Urobilinogen, UA: 0.2 mg/dL (ref 0.0–1.0)
Urobilinogen, UA: 0.2 mg/dL (ref 0.0–1.0)
pH: 6 (ref 5.0–8.0)
pH: 6 (ref 5.0–8.0)

## 2010-07-15 LAB — RAPID URINE DRUG SCREEN, HOSP PERFORMED
Barbiturates: NOT DETECTED
Barbiturates: NOT DETECTED
Benzodiazepines: POSITIVE — AB
Opiates: NOT DETECTED

## 2010-07-15 LAB — BASIC METABOLIC PANEL WITH GFR
BUN: 4 mg/dL — ABNORMAL LOW (ref 6–23)
CO2: 20 meq/L (ref 19–32)
Calcium: 8.8 mg/dL (ref 8.4–10.5)
Chloride: 109 meq/L (ref 96–112)
Creatinine, Ser: 1.06 mg/dL (ref 0.4–1.5)
GFR calc non Af Amer: >60 mL/min
Glucose, Bld: 106 mg/dL — ABNORMAL HIGH (ref 70–99)
Potassium: 4 meq/L (ref 3.5–5.1)
Sodium: 141 meq/L (ref 135–145)

## 2010-07-15 LAB — DIFFERENTIAL
Basophils Absolute: 0 10*3/uL (ref 0.0–0.1)
Basophils Absolute: 0.2 10*3/uL — ABNORMAL HIGH (ref 0.0–0.1)
Basophils Relative: 1 % (ref 0–1)
Basophils Relative: 2 % — ABNORMAL HIGH (ref 0–1)
Eosinophils Absolute: 0.1 10*3/uL (ref 0.0–0.7)
Eosinophils Absolute: 0.1 10*3/uL (ref 0.0–0.7)
Eosinophils Relative: 1 % (ref 0–5)
Lymphocytes Relative: 29 % (ref 12–46)
Lymphs Abs: 2.3 10*3/uL (ref 0.7–4.0)
Monocytes Absolute: 0.6 10*3/uL (ref 0.1–1.0)
Monocytes Relative: 8 % (ref 3–12)
Neutro Abs: 4.8 10*3/uL (ref 1.7–7.7)
Neutro Abs: 9.6 10*3/uL — ABNORMAL HIGH (ref 1.7–7.7)
Neutrophils Relative %: 62 % (ref 43–77)
Neutrophils Relative %: 77 % (ref 43–77)

## 2010-07-15 LAB — CBC
HCT: 36.6 % — ABNORMAL LOW (ref 39.0–52.0)
HCT: 41.6 % (ref 39.0–52.0)
Hemoglobin: 12.8 g/dL — ABNORMAL LOW (ref 13.0–17.0)
Hemoglobin: 13.8 g/dL (ref 13.0–17.0)
MCHC: 34.8 g/dL (ref 30.0–36.0)
MCV: 84.5 fL (ref 78.0–100.0)
Platelets: 336 10*3/uL (ref 150–400)
RBC: 4.33 MIL/uL (ref 4.22–5.81)
RBC: 4.78 MIL/uL (ref 4.22–5.81)
RDW: 16.3 % — ABNORMAL HIGH (ref 11.5–15.5)
WBC: 12.5 10*3/uL — ABNORMAL HIGH (ref 4.0–10.5)
WBC: 7.8 10*3/uL (ref 4.0–10.5)

## 2010-07-15 LAB — POCT I-STAT, CHEM 8
Chloride: 104 mEq/L (ref 96–112)
Creatinine, Ser: 1 mg/dL (ref 0.4–1.5)
Glucose, Bld: 117 mg/dL — ABNORMAL HIGH (ref 70–99)
HCT: 38 % — ABNORMAL LOW (ref 39.0–52.0)
Potassium: 3.7 mEq/L (ref 3.5–5.1)
Sodium: 139 mEq/L (ref 135–145)

## 2010-07-15 LAB — LIPASE, BLOOD: Lipase: 18 U/L (ref 11–59)

## 2010-07-15 LAB — COMPREHENSIVE METABOLIC PANEL
ALT: 18 U/L (ref 0–53)
Alkaline Phosphatase: 99 U/L (ref 39–117)
BUN: 9 mg/dL (ref 6–23)
CO2: 22 mEq/L (ref 19–32)
Chloride: 106 mEq/L (ref 96–112)
Glucose, Bld: 119 mg/dL — ABNORMAL HIGH (ref 70–99)
Potassium: 3.7 mEq/L (ref 3.5–5.1)
Sodium: 141 mEq/L (ref 135–145)
Total Bilirubin: 0.4 mg/dL (ref 0.3–1.2)

## 2010-07-15 LAB — ETHANOL
Alcohol, Ethyl (B): 271 mg/dL — ABNORMAL HIGH (ref 0–10)
Alcohol, Ethyl (B): <5 mg/dL (ref 0–10)

## 2010-07-15 LAB — SALICYLATE LEVEL: Salicylate Lvl: <4 mg/dL (ref 2.8–20.0)

## 2010-07-29 ENCOUNTER — Emergency Department (HOSPITAL_COMMUNITY)
Admission: EM | Admit: 2010-07-29 | Discharge: 2010-07-30 | Disposition: A | Discharge status: Home / Self Care | Payer: Medicare Other | Attending: Emergency Medicine | Admitting: Emergency Medicine

## 2010-07-29 DIAGNOSIS — K2920 Alcoholic gastritis without bleeding: Secondary | ICD-10-CM | POA: Insufficient documentation

## 2010-07-29 DIAGNOSIS — F319 Bipolar disorder, unspecified: Secondary | ICD-10-CM | POA: Insufficient documentation

## 2010-07-29 DIAGNOSIS — Z79899 Other long term (current) drug therapy: Secondary | ICD-10-CM | POA: Insufficient documentation

## 2010-07-29 DIAGNOSIS — Z8619 Personal history of other infectious and parasitic diseases: Secondary | ICD-10-CM | POA: Insufficient documentation

## 2010-07-29 DIAGNOSIS — R0682 Tachypnea, not elsewhere classified: Secondary | ICD-10-CM | POA: Insufficient documentation

## 2010-07-29 DIAGNOSIS — F209 Schizophrenia, unspecified: Secondary | ICD-10-CM | POA: Insufficient documentation

## 2010-07-29 DIAGNOSIS — I10 Essential (primary) hypertension: Secondary | ICD-10-CM | POA: Insufficient documentation

## 2010-07-29 DIAGNOSIS — F1020 Alcohol dependence, uncomplicated: Secondary | ICD-10-CM | POA: Insufficient documentation

## 2010-07-29 DIAGNOSIS — Z8673 Personal history of transient ischemic attack (TIA), and cerebral infarction without residual deficits: Secondary | ICD-10-CM | POA: Insufficient documentation

## 2010-07-29 DIAGNOSIS — F17200 Nicotine dependence, unspecified, uncomplicated: Secondary | ICD-10-CM | POA: Insufficient documentation

## 2010-07-29 DIAGNOSIS — F1010 Alcohol abuse, uncomplicated: Secondary | ICD-10-CM | POA: Insufficient documentation

## 2010-07-30 LAB — DIFFERENTIAL
Basophils Relative: 1 % (ref 0–1)
Eosinophils Absolute: 0.2 10*3/uL (ref 0.0–0.7)
Eosinophils Relative: 4 % (ref 0–5)
Lymphs Abs: 2.5 10*3/uL (ref 0.7–4.0)
Monocytes Relative: 7 % (ref 3–12)
Neutrophils Relative %: 46 % (ref 43–77)

## 2010-07-30 LAB — COMPREHENSIVE METABOLIC PANEL
ALT: 13 U/L (ref 0–53)
AST: 40 U/L — ABNORMAL HIGH (ref 0–37)
CO2: 24 mEq/L (ref 19–32)
Chloride: 98 mEq/L (ref 96–112)
GFR calc Af Amer: >60 mL/min (ref >60)
GFR calc non Af Amer: >60 mL/min (ref >60)
Potassium: 3.7 mEq/L (ref 3.5–5.1)
Sodium: 135 mEq/L (ref 135–145)
Total Bilirubin: 0.2 mg/dL — ABNORMAL LOW (ref 0.3–1.2)

## 2010-07-30 LAB — CBC
Hemoglobin: 14 g/dL (ref 13.0–17.0)
MCH: 30.8 pg (ref 26.0–34.0)
MCV: 88.6 fL (ref 78.0–100.0)
RBC: 4.55 MIL/uL (ref 4.22–5.81)

## 2010-08-12 NOTE — Discharge Summary (Signed)
[name], [name]                ACCOUNT NO.:  [id number]   MEDICAL RECORD NO.:  [id number]          PATIENT TYPE:  INP   LOCATION:  3030                         FACILITY:  [facility]   PHYSICIAN:  Lonia Blood, M.D.       DATE OF BIRTH:  [birth year]   DATE OF ADMISSION:  10/21/2007  DATE OF DISCHARGE:  10/23/2007                               DISCHARGE SUMMARY   PRIMARY CARE PHYSICIAN:  This patient was referred to Dr. Amada Kingfisher-  Bonsu.   DISCHARGE DIAGNOSES:  1. Multiple nonspecific neurological symptoms without any objective      findings of a stroke.  2. Schizophrenia.  3. History of alcohol abuse, currently in remission.  4. Depression with bipolar features.  5. Gastritis.   DISCHARGE MEDICATIONS:  1. Lexapro 20 mg daily.  2. Geodon 80 mg twice a day.  3. Folic acid 7.5 mg daily.  4. Carafate 1 g before each meal 3 times a day.  5. Omeprazole 20 mg daily.   CONDITION ON DISCHARGE:  Mr. [name] has returned in a stable condition to  his group home.  He was instructed to call for a visit with [facility] and to call for a visit for a new primary care with  Dr. Greggory Stallion Osei-Bonsu.   PROCEDURES DURING THIS ADMISSION:  On October 22, 2007, the patient  underwent MRI and MRA of the head without contrast with findings of no  acute infarct and mild atrophy.   CONSULTATION DURING THIS ADMISSION:  This patient was seen in  consultation by Dr. Marcelino Freestone, on October 21, 2007.   HOSPITAL COURSE:  Multiple nonspecific neurological symptoms.  Mr. [name]  was admitted via the emergency room with TIA.  His symptoms were  really not suggestive of such a disease.  He was seen by Dr. Orlin Hilding  from Neurology and she was in a total agreement with this assessment.  The patient underwent MRI and MRA of the head without any worrisome  findings.  By hospital day #2, the patient's symptoms have completely  resolved and we did not find any benefit in initiating antiplatelet  treatment in this gentleman.  Mr. [name] has a long history of  schizophrenia and he was continued on Geodon.  We have continued the  patient's medication for his gastritis and we  again applauded him for being abstinent from alcohol.  The patient was  strongly encouraged to follow up with a new primary care physician and  with [facility] and his group home has been contacted  and told to make sure that the patient makes those appointments.      Lonia Blood, M.D.  Electronically Signed     SL/MEDQ  D:  10/24/2007  T:  10/25/2007  Job:  [id number]

## 2010-08-12 NOTE — Discharge Summary (Signed)
[name], SHEETS                ACCOUNT NO.:  [id number]   MEDICAL RECORD NO.:  [id number]          PATIENT TYPE:  INP   LOCATION:  1516                         FACILITY:  [facility]   PHYSICIAN:  Peggye Pitt, M.D. DATE OF BIRTH:  1965-12-17   DATE OF ADMISSION:  08/06/2008  DATE OF DISCHARGE:  08/09/2008                               DISCHARGE SUMMARY   DISCHARGE DIAGNOSES:  1. Erosive esophagitis, positive for Helicobacter pylori.  2. Alcohol abuse.  3. Chronic diastolic congestive heart failure, new diagnosis.  4. History of hypertension.  5. History of depression.  6. History of schizophrenia.  7. History of hypothyroidism.  8. Color blindness.   DISCHARGE MEDICATIONS:  1. Protonix 40 mg twice daily.  2. Amoxicillin 500 mg twice daily until Aug 22, 2008.  3. Clarithromycin 500 mg twice daily until Aug 22, 2008.  4. Thiamine 100 mg daily.  5. Multivitamin 1 tablet daily.  6. Lexapro 20 mg daily.  7. Lisinopril 20 mg daily.  8. Levothyroxine 75 mg daily.  9. Zyprexa 10 mg daily.  10.Carafate 1 gm 4 times a day.   DISPOSITION:  [name] is discharged home in stable condition.  His  hemoglobin has remained stable.  Of note, his CLOtest was positive for  H. pylori and hence he has been sent home on treatment for this.  He  does not currently have a primary care physician, so I have asked Care  Management to assist me with both providing patient with information  about [facility] and also providing assistance with medications.   CONSULTATIONS:  Wilhemina Bonito. Marina Goodell, MD with GI.   IMAGES/PROCEDURES PERFORMED DURING THIS HOSPITALIZATION:  1. An abdominal x-ray on Aug 06, 2008 that showed a nonobstructive      bowel gas pattern, low lung volumes with bibasilar atelectasis.  2. A CT scan of the chest, abdomen and pelvis on Aug 06, 2008 that      showed marked thickening of the mid and distal thoracic esophagus,      either representative of florid esophagitis or neoplasm.  No      central pulmonary embolus.  Fatty liver.  History of a      cholecystectomy.  Thickening of the left adrenal gland with no      acute pelvic abnormality.  3. The patient had a 2-D echocardiogram on Aug 07, 2008 that showed an      ejection fraction of 55-60% with left ventricular diastolic      dysfunction.  4. The patient had an EGD on Aug 07, 2008 that showed severe erosive      esophagitis with no Barrett's esophagus.  Duodenitis was found in      the bulb of the duodenum as well as a small hiatal hernia.   HISTORY/PHYSICAL EXAMINATION:  For full details, please refer to  dictation on Aug 06, 2008 by Dr. Sabana Eneas Callas, but in brief, [name] is a 45-  year-old gentleman who presented to the hospital with abdominal pain,  vomiting and pleuritic chest pain.  He had noticed that he had vomited  some dark  substance.  Was not sure it was blood.  Because of this, he  was brought into the hospital.   HOSPITAL COURSE:  1. Abdominal pain.  Because of findings on a CAT scan of a second      esophagus, Dr. Marina Goodell with [facility] was consulted who performed an      EGD with findings consistent with severe erosive esophagitis.  He      has recommended twice daily PPI as well as alcohol cessation.  A      CLOtest has been positive for H. pylori.  Hence, I have also added      amoxicillin and clarithromycin to his medication regimen which he      will take for a total of 14 days.  2. For his alcohol abuse, he has been given extensive cessation      counseling while in the hospital.  He is to be discharged on      thiamine and a multivitamin.  3. Chronic diastolic congestive heart failure which is a new diagnosis      this hospitalization.  He has preserved LV function.  No treatment      is indicated at this time.  4. All the rest of his chronic medical issues have not been a problem      this hospitalization and no medication changes have been made.   Discharge vital signs:  Blood pressure  104/72, heart rate 93,  respirations 16, O2 sat is 97% on room air with a temperature of 97.8.   LABORATORY DATA:  Labs on day of discharge:  Sodium 139, potassium 4.1,  chloride 111, bicarb 24, BUN 5, creatinine 0.85 with a glucose of 106,  WBC 8.4, hemoglobin 12.8, platelet count 231.      Peggye Pitt, M.D.  Electronically Signed     EH/MEDQ  D:  08/09/2008  T:  08/09/2008  Job:  [id number]   cc:   Wilhemina Bonito. Marina Goodell, MD  [address]  [place]  [place] [address]

## 2010-08-12 NOTE — H&P (Signed)
[name], [name]                ACCOUNT NO.:  [id number]   MEDICAL RECORD NO.:  [id number]           PATIENT TYPE:   LOCATION:                                 FACILITY:   PHYSICIAN:  Pedro Earls, MD     DATE OF BIRTH:  [birth year]   DATE OF ADMISSION:  08/06/2008  DATE OF DISCHARGE:                              HISTORY & PHYSICAL   PRIMARY CARE:  Unassigned.   CHIEF COMPLAINT:  Pleuritic chest pain, abdominal pain, nausea and  vomiting.   HISTORY OF PRESENT ILLNESS:  This is a 45 year old male patient with a  past medical history significant for alcohol abuse, COPD and peptic  ulcer disease who presented with a chief complaint of abdominal pain,  vomiting and pleuritic chest pain.  The patient is colorblind but had  mentioned that he had vomited some dark substance, was not sure if it  was blood, although he had a similar episode in the past when he had  hematemesis.  The patient was also complaining of some abdominal pain,  some nausea and vomiting.  The patient's abdominal pain is 7-8/10 on a  scale of 1-10 and 10 is the worst pain.  It is mainly located in the  epigastrium, although the patient also has some pleuritic kind of chest  pain component to it as well.   REVIEW OF SYSTEMS:  As above.  Rest of the review of systems are  negative.   PAST MEDICAL HISTORY:  1. Asthma.  2. COPD.  3. Hypertension.  4. Adrenoleukodystrophy.  5. EtOH abuse.  6. Bipolar disorder.  7. Depression.  8. Hemoptysis.  9. Hypertriglyceridemia.  10.Hypothyroidism.  11.Peptic ulcer disease.  12.Schizophrenia.  13.CVA.  14.Colorblindness.  15.The patient has a left-sided weakness from the stroke.   PAST SURGICAL HISTORY:  1. Cholecystectomy.  2. History of bleeding from his lungs.   SOCIAL HISTORY:  The patient smokes 2 packs per day for past 30 years.  Heavy alcoholic, drinks 1-2 cans of beer every day.  No drug abuse.   FAMILY HISTORY:  Negative for coronary artery disease.   ALLERGY:  To ASPIRIN, causes swelling.   MEDICATIONS:  1. Lexapro 20 mg daily.  2. Lisinopril 20 mg daily.  3. Levothyroxine 25 mcg daily.  4. Zyprexa 10 mg daily.  5. Carafate 1 g 4 times daily.   PHYSICAL EXAMINATION:  VITALS:  Temperature is 98.8, blood pressure  140/103, pulse 120, respirations 20s, his pulse ox 97% on room air.  Patient awake, oriented x3, does not appear to be in acute distress.  HEENT:  Pupils equal, round and reactive to light, no icterus.  Mild  pallor.  Extraocular movements intact, mucosa is dry.  NECK:  Supple.  No JVD.  No lymphadenopathy.  CVS:  S1, S2.  Sinus tach.  No murmurs, heaves or gallops.  CHEST:  Bibasilar crackles.  ABDOMEN: Soft.  There is a tenderness on deep palpation of the mid  epigastrium and right upper quadrant.  No rebound.  Bowel sounds  present.  No hepatosplenomegaly.  EXTREMITIES:  Peripheral pulses are present.  No clubbing, cyanosis or  edema.  CNS:  Sensorimotor grossly intact.  Cranial nerves II-XII are intact.  SKIN:  No rashes.  MUSCULOSKELETAL:  Unremarkable.   The patient's abdominal x-ray revealed nonobstructive bowel gas pattern,  some questionable infectious etiology in the right colon with right-  sided aspiration pneumonia.   Potassium is 3.3, glucose 116, alkaline phosphatase 122, alcohol level  is 193, lipase less than 10.  INR is 1.0.  White count is 11.9 with a  left shift.   The patient's EKG showed sinus tachycardia.  No acute ST-T wave changes  were seen.   IMPRESSION:  1. Aspiration pneumonia.  2. Abdominal pain with vomiting.  3. Pleuritic chest pain, rule out pulmonary infiltrate.  4. Dehydration.  5. Possible colitis.   PLAN:  Admit to telemetry.  Start Zosyn for aspiration pneumonia.  Keep  the patient n.p.o. except meds, IV fluids with multivitamin and  potassium, Protonix 40 IV b.i.d.  Start Lovenox for DVT prophylaxis.  Get a speech and swallow evaluation.  Tobacco counseling and  substance  abuse counseling.      Pedro Earls, MD  Electronically Signed    NS/MEDQ  D:  08/06/2008  T:  08/06/2008  Job:  [id number]

## 2010-08-12 NOTE — H&P (Signed)
[name], [name]                ACCOUNT NO.:  [id number]   MEDICAL RECORD NO.:  [id number]          PATIENT TYPE:  IPS   LOCATION:  0402                          [facility]ACILITY:  [facility]   PHYSICIAN:  Anselm Jungling, MD  DATE OF BIRTH:  24-Sep-1965   DATE OF ADMISSION:  03/03/2008  DATE OF DISCHARGE:                       PSYCHIATRIC ADMISSION ASSESSMENT   This is a voluntary admission to the services of Dr. Geralyn Flash.  This is a 45 year old native [place] male.  Apparently he left  his placement the day after Thanksgiving.  He went to his mother's and  yesterday went to the tree lighting ceremony where he relapsed on  alcohol.  Indeed his alcohol level was 292.  The police picked him up  and they brought him to mental health and they felt that he would need  to go for medical clearance.  He was brought to the ED at [facility] and he was expressing thoughts of suicide that he had had for a  long time due to PTSD from his experiences in the war in [place].  He  admits to a drinking problem.  He thinks he hates people stemming from  his experiences in war and also because his father was in the [place]  war.  He stated he wanted to shoot himself in the head.  He was also  complaining of some upper mid epigastric pain which he attributes to his  alcohol abuse tonight.  He asks for some Maalox and something to help  him sleep.  Today his alcohol level was 292 in the ED.  Today he sobered  up.  He states he is not a Administrator, [employer].  He has never been a sniper.  He does  have a diagnosis of schizophrenia.  He was diagnosed approximately 12  years ago and his only other prior hospitalizations have been at [facility].  He is currently an outpatient at [facility].  He is in residence at [facility].  Phone  number [phone].   SOCIAL HISTORY:  He finished high school in [birth year].  He has been married  and divorced twice.  He originally says he  has no children then he  remembers he does have one son age 11.  He is not employed.  He does  receive SSDI.   FAMILY HISTORY:  He denies.   ALCOHOL AND DRUG USE:  He states he has been sober 7 months until his  relapse yesterday.   PRIMARY CARE PHYSICIAN:  He is followed medically through [facility].   MEDICAL PROBLEMS:  Hypertension and reflux.   MEDICATIONS:  1. These were verified with [facility].  He is prescribed Lexapro 20 mg      p.o. daily.  2. Geodon 80 mg p.o. daily.  3. Multivitamin.  4. Nifedipine 30 mg p.o. daily.  5. Lisinopril 20 mg p.o. daily.  6. Carafate 1 g p.o. q.i.d.   PHYSICAL FINDINGS:  He was medically cleared in the ED.  He had no other  remarkable findings.  His vital signs on admission show he  is 5 feet 9  inches, weighs 193, temperature is 98, blood pressure is 140/84, pulse  112, respirations are 20.  Today he has no hand tremor.  He has no other  signs of symptoms of alcohol withdrawal.   MENTAL STATUS EXAM:  He is alert and oriented.  He was appropriately  groomed, dressed and nourished.  His speech is a normal rate, rhythm and  tone.  His mood is appropriate to the situation.  His affect is a little  depressed.  His thought processes are clear, rational and goal oriented  although superficial and somewhat concrete.  Judgment and insight are  fair.  Concentration and memory are fair.  He denies being suicidal or  homicidal.  He denies auditory or visual hallucinations.   DIAGNOSES:  AXIS I:  Acute alcohol intoxication, schizophrenia, paranoid  type, unclear how long it has been since he has actually taken his  medications since he has been gone about a week.  AXIS II:  Deferred.  AXIS III:  Hypertension, gastritis, history for anemia.  AXIS IV:  Burden of illness.  AXIS V:  29.   PLAN:  To help him through his alcohol detox.  Since it was a 1 day  relapse chances are he will need very little by way of support.  He has  no indication for  withdrawal at the moment.  We will restart his  medications and will actually do a family session tomorrow to see if it  is reasonable for him to return to [employer] tomorrow.      [name] Leonarda Salon, P.A.-C.      Anselm Jungling, MD  Electronically Signed    MD/MEDQ  D:  03/03/2008  T:  03/03/2008  Job:  [id number]

## 2010-08-12 NOTE — Discharge Summary (Signed)
[name], [name]                ACCOUNT NO.:  [id number]   MEDICAL RECORD NO.:  [id number]          PATIENT TYPE:  IPS   LOCATION:  0302                          FACILITY:  [facility]   PHYSICIAN:  Anselm Jungling, MD  DATE OF BIRTH:  06/14/1965   DATE OF ADMISSION:  DATE OF DISCHARGE:  05/10/2008                               DISCHARGE SUMMARY   IDENTIFYING DATA AND REASON FOR ADMISSION:  This was of one of many [facility]  admissions for [name], a 45 year old Native American male, unmarried and  homeless.  He was admitted again due to alcohol intoxication, and  indicating auditory and visual hallucinations.  Please refer to the  admission note for further details pertaining to the symptoms,  circumstances and history that led to his hospitalization.  He was given  an initial Axis I diagnosis of alcohol dependence NOS, and psychosis  NOS, and rule out bipolar disorder.   MEDICAL LABORATORY:  The patient was medically and physically assessed  by the psychiatric nurse practitioner.  He had a known history of  hypothyroidism and was continued on his usual Synthroid 25 mcg daily.  Aside from this, there were no significant medical issues.   HOSPITAL COURSE:  The patient was admitted to the adult inpatient  psychiatric service.  He was initially seen and examined by Dr. Lolly Mustache,  and the undersigned assumed the patient's care on the third hospital  day.  By that time, the patient reported that he was feeling okay, was  eating well, was sleeping reasonably well, and was non-tremulous.  There  were no signs or symptoms of psychosis.  He indicated that he was  interested in trying to find a new assisted-living facility.   He was continued on his usual Geodon 80 mg b.i.d. which was well  tolerated.   The patient was transferred to [facility] emergency room at  one point due to onset nausea and vomiting.  He was on one-to-one  observation following this.   There was an incident on the  evening of his last hospital day in which  he attempted to formulate some kind of  sparking device.  It was not  clear the purpose of this, but he had dismantled an electrical cord in  his room, and had been attempting to light a facsimile of a cigarette  that he had created from dirt that he found and toilet paper.  Because  of this, it was felt that the patient represented sponge and needle  counts were correct extreme safety risk, and his discharge was arranged.  He was not allowed to return back to his previous assisted living  facility.  The patient had been given numbers for [facility] [place] [facility] to call,  but he had not made any arrangements.   AFTERCARE:  The patient was to follow up at [facility] with an appointment on February 23 at 8:30 a.m..   DISCHARGE MEDICATIONS:  1. Geodon 80 mg b.i.d.  2. Synthroid 25 mcg daily.   DISCHARGE DIAGNOSES:  AXIS I: Alcohol dependence, chronic, severe.  AXIS II:  History of psychotic disorder, NOS.  AXIS III:  Hypothyroidism.  AXIS IV:  Stressors, severe.  AXIS V: GAF on discharge 50.      Anselm Jungling, MD  Electronically Signed     SPB/MEDQ  D:  05/14/2008  T:  05/14/2008  Job:  [id number]

## 2010-08-12 NOTE — H&P (Signed)
NAMEJUNIS, [name]                ACCOUNT NO.:  [id number]   MEDICAL RECORD NO.:  [id number]          PATIENT TYPE:  IPS   LOCATION:  0306                          FACILITY:  [facility]   PHYSICIAN:  Jasmine Pang, M.D. DATE OF BIRTH:  Aug 27, 1965   DATE OF ADMISSION:  04/23/2008  DATE OF DISCHARGE:                       PSYCHIATRIC ADMISSION ASSESSMENT   A 45-year male involuntarily petitioned on April 23, 2008.  The  patient is here on petition with papers stating that the patient was  threatening to kill the police, himself, and the president.  Was  intoxicated and threatened to leave the facility.  The patient states  that he was watching a football game, drank 2 beers, walked home in  the rain, lost his shoe, police had seen him, offered him a ride, and  the patient had made statements and was taken then to the Emergency  Department for further assessment.  He currently denies any suicidal or  homicidal thoughts or hallucinations.   PAST PSYCHIATRIC HISTORY:  The patient was recently discharged on  January 22 here for depression and alcohol.  Followup was at [facility].   SOCIAL HISTORY:  A 45 year old male discharged to [facility]  [employer].   FAMILY HISTORY:  None.   ALCOHOL AND DRUG HISTORY:  Again, the patient has been drinking.  Report  drinking just 2 beers, but blood alcohol level was 303.  No other  recreational drug use.  Primary care Heydi Swango is none.   MEDICAL PROBLEMS:  Hyperthyroidism, hyperlipidemia, gastritis, and  hypertension.   DISCHARGE MEDICATIONS:  Listed as Lexapro 20 mg daily, Prinivil 20 mg  daily, levothyroxine 25 mcg daily, Procardia XL 30 mg daily, and Geodon  80 mg at bedtime.   DRUG ALLERGIES:  ASPIRIN with side effect of his throat swelling.   PHYSICAL EXAMINATION:  GENERAL:  This is a middle-aged male fully  assessed at [facility] Emergency Department.  Physical exam was  reviewed.  The patient did  receive a GI cocktail, IV fluids, Ativan, and  Benadryl.  VITAL SIGNS:  His temperature is 97.6, 87 heart rate, 18 respirations,  blood pressure is 106/75.   LABORATORY DATA:  Shows the alcohol level of 303.  Urinalysis is  negative.  Urine drug screen is positive for benzodiazepines.   MENTAL STATUS EXAM:  The patient is in the bed at this time.  Sleeping  away from the interviewer's.  Refusing to turn over and participate.  Speech:  The patient offers a few words and only with much encouragement  with several questions.  Thought process:  No obvious psychotic  symptoms.  He denies any suicidal ideation.  Memory is fair.  Judgment  poor.  Insight appears to be poor.   AXIS I:  Bipolar disorder not otherwise specified, alcohol abuse, rule  out dependence.  AXIS II:  Deferred.  AXIS III:  Hyperlipidemia, hypertension, hyperthyroidism, and gastritis.  AXIS IV:  Deferred at this time.  AXIS V:  Current is 30.   Our plan is to have Librium available on a p.r.n. basis.  We  will  continue with the patient's prior medications for his medical problems.  We will encourage the patient to participate in the red group, and case  manager will identify his living arrangements.  We will reinforce  medication compliance and coping skills and relapse prevention.  Our  tentative length of stay is 2-3 days.      Landry Corporal, N.P.      Jasmine Pang, M.D.  Electronically Signed    JO/MEDQ  D:  04/24/2008  T:  04/24/2008  Job:  [id number]

## 2010-08-12 NOTE — H&P (Signed)
[name], [name]                ACCOUNT NO.:  [id number]   MEDICAL RECORD NO.:  [id number]          PATIENT TYPE:  INP   LOCATION:  3030                         FACILITY:  [facility]   PHYSICIAN:  Lucita Ferrara, MD         DATE OF BIRTH:  [birth year]   DATE OF ADMISSION:  10/21/2007  DATE OF DISCHARGE:                              HISTORY & PHYSICAL   PRIMARY CARE PHYSICIAN:  Unassigned.   The patient is a 45 year old male who presented with the chief complaint  of unilateral left-sided facial, left arm, and left leg numbness.  This  started 1 hour prior to arrival; onset was acute and persistent and  getting worse.  There is no associated difficulty or slurring of his  speech.  There is no headaches, changes in visual acuity, nausea,  vomiting or other neurological deficits -- except the ones just  mentioned.  Code Stroke was called.  A neurologist came to evaluate, but  did not feel that he was amendable to any interventional TPA.   PAST MEDICAL HISTORY:  1. Hypertension.  2. Diabetes.  3. Bipolar disorder.  4. Chemical dependency, likely alcoholism.   SOCIAL HISTORY:  Lives in assisted living.  He currently does not drink,  but is a recovering alcoholic.  He smokes one-half pack per day.   ALLERGIES:  Allergic to ASPIRIN.   FAMILY HISTORY:  The patient is adopted and does not know family  history.   PAST SURGICAL HISTORY:  None.   SOCIAL HISTORY:  He does have a history of chemical dependency.  He is a  poor historian.   MEDICATIONS:  1. Lexapro 20 mg daily.  2. Geodon 80 mg p.o. b.i.d.  3. Prozac 40 mg b.i.d.  4. Carafate 1 gram specialized dosing.  5. Deplin 7.5 mg p.o. daily.  6. Revia 50 mg p.o. once daily.   REVIEW OF SYSTEMS:  Otherwise negative and per HPI.  Negative for cough,  dyspnea or pleuritic chest pain.  Positive for some nausea and vomiting.  The patient did have some diarrhea.   PHYSICAL EXAMINATION:  GENERAL:  The patient is in no acute distress.  Blood pressure 118/68, pulse 77, respirations 16, temperature 98.6.  HEENT:  Normocephalic, atraumatic.  PERRLA.  Extraocular movements  intact.  NECK:  Supple.  No JVD or carotid bruits.  CARDIOVASCULAR:  Normal S1 and S2, regular rate and rhythm.  No murmurs,  rubs, clicks.  LUNGS:  Clear to auscultation bilaterally.  No rhonchi, rales or  wheezes.  EXTREMITIES:  No clubbing, cyanosis or edema.  ABDOMEN:  Soft, nontender, nondistended.  Positive bowel sounds.  NEUROLOGIC:  Patient is alert and oriented x3.  Cranial nerves II-XII  grossly intact.  Mild weakness in the left upper extremity, probably  4/5, and left lower extremity.  He has difficulty with heel-to-shin  reflexes.   EKG:  Shows normal sinus rhythm.  No ST or T-wave changes.  His stroke  labs include:  INR 0.9, hemoglobin and hematocrit mildly low at 11.8 and  36.  Glucose mildly elevated at 122.  AST  mildly high at 39.  Otherwise,  the rest of his labs are within normal limits CBC normal.  CT scan of  the head without contrast shows small vessel chronic ischemic changes of  deep cerebral white matter; questioned tiny old lacunar-thalamic lacunar  infarct.   ASSESSMENT:  A 45 year old with:  1. Left-sided headache.  2. Left-sided upper and lower extremity weakness, not substantiated by      any physical exam findings.  CT scan shows an old right-sided      lacunar infarct,  small vessel ischemic changes of the deep      cerebral white matter..  The patient, per neurology, is not a      candidate for any acute thrombolytic treatment.   PLAN:  We will go ahead and admit the patient to the neurology floor.  Neuro checks every 4 hours.  MRI and MRA of the brain, bilateral carotid  Dopplers, 2-D echocardiogram.  Note, the patient is allergic to aspirin.  Will consider Plavix.  TSH, RPR, ESR, fasting lipid profile,  hypercoagulable panel, DVT and GI prophylaxis, physical therapy and  occupational therapy.  The rest of the  plans are really dependent on his  progress.  I thank Neurology for seeing our patient today.      Lucita Ferrara, MD  Electronically Signed     RR/MEDQ  D:  10/22/2007  T:  10/22/2007  Job:  [id number]

## 2010-08-12 NOTE — H&P (Signed)
[name], NODAL                ACCOUNT NO.:  [id number]   MEDICAL RECORD NO.:  [id number]          PATIENT TYPE:  IPS   LOCATION:  0300                          FACILITY:  [facility]   PHYSICIAN:  Jasmine Pang, M.D. DATE OF BIRTH:  21-Apr-1965   DATE OF ADMISSION:  04/16/2008  DATE OF DISCHARGE:                       PSYCHIATRIC ADMISSION ASSESSMENT   IDENTIFICATION:  This is 45 year old single male who was admitted on a  voluntary basis on April 16, 2008.  He was referred by the ED.  He is  currently from [place], [place].   HISTORY OF PRESENT ILLNESS:  The patient complains of depression with  alcohol abuse.  He presented with a complaint of depression.  He had  suicidal ideation with plan and then he states he started living this  way.  He admits he drinks to deal with stressors in his life.  He has  been using alcohol regularly up to 12-pack of beer daily plus a fair  amount of liquor daily.  He states he sees a Therapist, sports at the [facility].  He denies any drug use.  He is currently on Lexapro 20 mg daily  by the [name].   PAST PSYCHIATRIC HISTORY:  As indicated above patient goes to the  [facility].  He is currently on Lexapro 20 mg daily.  He is not sure  which doctor he sees there.  The patient is also on Geodon 80 mg daily.   SOCIAL HISTORY:  The patient lives in [facility].  He is  separated.  He has a 73 year old daughter who he states is support.  There is no history of sexual, physical, or emotional abuse by the  patient's report.   FAMILY HISTORY:  The patient denies any history of mood disorders or  substance abuse in his family.   ALCOHOL AND DRUG HISTORY:  The patient denies any drug use other than  his alcohol abuse.   MEDICAL PROBLEMS:  There were no acute medical problems.  The patient  has gastritis, hypertension, anemia, hypertriglyceridemia, and  hypothyroidism.   MEDICATIONS:  1. Lexapro 20 mg daily.  2.  Lisinopril 20 mg daily.  3. Levothyroxine 25 mcg daily.  4. Nifedipine 30 mg daily.  5. Geodon 80 mg daily.  6. Carafate 1 g daily.   ALLERGIES:  He is allergic to ASPIRIN.   MENTAL STATUS EXAM:  The patient was lying in bed when I talked with  him. He was disheveled.  Eye contact was absent.  There was psychomotor  retardation.  Speech was soft and slow.  He was very drowsy and  lethargic.  Mood was depressed and anxious.  Affect consistent with  mood, somewhat blunted.  Anxiety level was moderate-to-severe and  thought processes were logical and goal-directed.  Thought content, no  predominant theme. There were no auditory or visual hallucinations.  No  paranoia or delusions.  Cognitive was within normal limits.   PHYSICAL EXAMINATION:  There are no acute physical or medical problems  noted.  The patient was admitted in the [facility] ED.   ADMISSION LABORATORY DATA:  Basic metabolic panel was a basic i-STAT  Chem-8 panel was within normal limits.  The CBC was within normal  limits.  Urinalysis was negative.  Alcohol level was 285.  Urine drug  screen negative.   ADMISSION DIAGNOSES:  Axis I:  Mood disorder, not otherwise specified,  alcohol dependence.  Axis II:  Deferred.  Axis III:  Gastritis, hypothyroidism, hypertension, anemia,  dyslipidemia.  Axis IV:  Moderate-to-severe (problems with primary support group,  problems related to social environment, medical problems, burden of  psychiatric and chemical dependence illness, other psychosocial  problems).   ANTICIPATED LENGTH OF STAY:  Three to five days.  Condition is fair for  discharge.  Less depressed, less anxious, not suicidal, detox for  alcohol.   THERAPEUTIC PLAN:  The patient will start on Librium detox protocol.  He  will be placed back on his Geodon.  The patient will be involved in unit  therapeutic groups and activities.  The patient will be on the Red Team  for chemical dependence treatment including AA  and NA group.      Jasmine Pang, M.D.  Electronically Signed     BHS/MEDQ  D:  04/17/2008  T:  04/18/2008  Job:  [id number]

## 2010-08-12 NOTE — Discharge Summary (Signed)
NAMEJAFET, MCFEATERS                ACCOUNT NO.:  [id number]   MEDICAL RECORD NO.:  [id number]          PATIENT TYPE:  IPS   LOCATION:  0407                          FACILITY:  [facility]   PHYSICIAN:  Jasmine Pang, M.D. DATE OF BIRTH:  September 19, 1965   DATE OF ADMISSION:  04/23/2008  DATE OF DISCHARGE:  04/26/2008                               DISCHARGE SUMMARY   IDENTIFICATION:  This is a 45 year old single male who resides in [facility] in [place].   HISTORY OF PRESENT ILLNESS:  The patient is here on petition with paper  stating that he is threatening to kill the police, himself, and the  president.  He was intoxicated and threatened to leave the facility.  The patient states he was watching a football game drank 2 beers  walked home in the rain, lost his issue.  He states the police had seen  him and offered him arrive and the patient made the above statements,  and was taken then to the emergency department for further assessment.  He currently denies any suicidal or homicidal thoughts or  hallucinations.   PAST PSYCHIATRIC HISTORY:  The patient was recently discharged on  April 20, 2008, here for depression and alcohol abuse.  He was to  follow up at the [facility].   FAMILY HISTORY:  None.   ALCOHOL AND DRUG HISTORY:  The patient has been drinking.  He reports  drinking 2 beers, but blood alcohol level was 303.  He denies other  recreational drug use; however, his urine drug screen was positive for  benzodiazepines.   MEDICAL PROBLEMS:  Hyperthyroidism, hyperlipidemia, gastritis, and  hypertension.   MEDICATIONS:  Lexapro 20 mg daily, Prinivil 20 mg daily, levothyroxine  25 mcg daily, Procardia XL 30 mg daily, and Geodon 80 mg at bedtime.   DRUG ALLERGIES:  ASPIRIN with a side effect of his throat swelling.   PHYSICAL EXAM:  There were no acute physical or medical problems noted.  He was fully assessed at the [facility] Emergency  Department.   LABORATORY DATA:  Shows the alcohol level was 303.  Urinalysis was  negative.  Urine drug screen was positive for benzodiazepines.   HOSPITAL COURSE:  Upon admission, the patient was started on Librium 25  mg p.o. q.6 h. p.r.n. withdrawal and Protonix 40 mg daily.  He was also  restarted on his Lexapro 20 mg daily, Prinivil 20 mg daily,  levothyroxine 25 mcg daily, Procardia XL 30 mg daily, Geodon 18 mg p.o.  q.h.s. and he was also started on 21 mg nicotine patch as per smoking  cessation protocol.  In individual sessions, the patient was initially  sullen and withdrawn.  He stayed in bed.  He began to talk about wanting  to go home.  He states he was not suicidal though he told the police he  was thinking of killing himself.  On April 25, 2008, the patient broke  the window in his room and his gate.  He had to be apprehended by the  police.  He was brought back  to the hospital and given Geodon 20 mg IM  now due to severe agitation.  He was also given Ativan 2 mg IM now and  the Librium was discontinued.  He had to go into seclusion for  unpredictable aggressive behavior and he was on one-to-one.  The patient  stayed in the quiet room for most of the day on April 25, 2008.  He  was sleepy and slept during the day.  On April 26, 2008, he was  ordered some Ambien 10 mg p.o. q.h.s. p.r.n. may repeat x 1, if still  awake; was also ordered some Seroquel 100 mg q.4 h. p.r.n. agitation,  but he did not want to take this.  On April 26, 2008, mental status  had improved.  The patient was not irritable, not angry.  He wanted to  go home.  His affect was consistent with mood.  There was no suicidal or  homicidal ideation.  No thoughts of self-injurious behavior.  No  auditory or visual hallucinations.  No paranoia or delusions.  Thoughts  were logical and goal-directed.  Thought content, no predominant theme.  Cognitive was grossly intact.  It was felt, the patient's actions  prior  to admission had been done while inebriated.  He was not suicidal or  homicidal.   DISCHARGE DIAGNOSES:  Axis I: Bipolar disorder not otherwise specified.  Alcohol dependence.  Axis II: Features of antisocial personality disorder.  Axis III: Hyperlipidemia, hypertension, hyperthyroidism, and gastritis.  Axis IV: Moderate (burden of psychiatric illness, burden of chemical  dependence illness other psychosocial problems).  Axis V: Global assessment of functioning was 50 upon discharge.  GAF was  30 upon admission.  GAF highest past year was 55-60.   DISCHARGE PLANS:  There was no activity or dietary level restriction.   POSTHOSPITAL CARE PLANS:  The patient will return to the [facility]  for followup psychiatric treatment and chemical dependence treatment.   DISCHARGE MEDICATIONS:  1. Prinivil 20 mg daily.  2. Levothyroxine 25 mcg daily.  3. Procardia XL 30 mg daily.  4. Geodon 80 mg at bedtime.  5. Protonix 40 mg at bedtime.      Jasmine Pang, M.D.  Electronically Signed     BHS/MEDQ  D:  04/28/2008  T:  04/29/2008  Job:  [id number]

## 2010-08-12 NOTE — Consult Note (Signed)
NAMEMARSHUN, FULMORE                ACCOUNT NO.:  [id number]   MEDICAL RECORD NO.:  [id number]          PATIENT TYPE:  INP   LOCATION:  3030                         FACILITY:  [facility]   PHYSICIAN:  Santina Evans A. Orlin Hilding, M.D.DATE OF BIRTH:  Jan 16, 1966   DATE OF CONSULTATION:  10/21/2007  DATE OF DISCHARGE:                                 CONSULTATION   CHIEF COMPLAINT:  Left-sided numbness, headache, nausea, and vomiting.   HISTORY OF PRESENT ILLNESS:  [name] is a 45 year old [place] who  moved from [place] recently to be near family.  He is living in a group  home due to disability from bipolar disorder primarily as far as I can  tell, as well as some mechanical difficulties due to remote fractures of  the right knee and elbow.  He presented with onset of right-sided  headache and left body numbness at about 2000.  He has a history of  noncompliance with his blood pressure medicines and alcohol abuse.  He  told the staff that he recently stopped taking his medications, so he  could drink alcohol.  He did not have any mobility problems or  difficulty speaking.  He has never had anything like this happened to  him before.  Complained of blurred, double vision, nausea, and the  headache.  His symptoms have resolved considerably except for the  headache.   REVIEW OF SYSTEMS:  A 12-system review is negative per the patient  except for right knee and elbow fracture.  He is a poor historian,  however.   PAST MEDICAL HISTORY:  Appears to be significant for bipolar disorder,  depression, gastritis, bleeding lung according to him, right knee and  elbow fracture.  Other notes from the ER indicate there is a history of  alcohol abuse.   MEDICATIONS:  1. Lexapro 20 mg daily.  2. Geodon 80 mg twice a day.  3. Prozac 40 mg twice a day.  4. Deplin 7.5 mg daily.  5. Carafate.   ALLERGIES:  He says ASPIRIN causes his throat to swell up.   SOCIAL HISTORY:  He is [place] from [place]  recently move here to be near  family, but he is living in a group home because of disability.  I think  it may be due to his bipolar rather than his musculoskeletal disability,  but that is not clear.  He does not know anything about his family  history because he was adopted.   OBJECTIVE:  VITAL SIGNS:  Temperature is 98.6; blood pressure was  165/109, initially down to 134/86; heart rate 82; respirations 16; and  100% saturations.  HEENT:  Head is normocephalic, atraumatic.  NECK:  Supple without bruits.  LUNGS:  Clear to auscultation.  CARDIOVASCULAR:  Regular rate and rhythm.  ABDOMEN:  Nondistended.  SKIN:  Dry.  Mucosa is moist.  NEUROLOGIC:  He scores 1 on the stroke scale for decreased sensation on  the left side.  He is alert, answers 2/2 questions correctly, follows  2/2 commands correctly.  Has normal gaze, no visual field cut, no facial  droop, no drift  in either upper or lower extremities.  No ataxia.  Decreased sensation on the left side.  No language problems, no  dysarthria, no neglect.   CT of the brain shows some mild posterior small-vessel ischemia,  question of small right thalamic lacunar infarct, nothing definitely  acute.  INR is 0.9.  BMET is pending.  Platelets were 282, hemoglobin is  11.8.   ASSESSMENT:  Multiple nonspecific neurological symptoms with headache  and left-sided numbness with questionable right brain stroke clinically  versus migraine.  CT is negative for anything acute.   RECOMMENDATIONS:  We will obtain MRI scan of the brain, MR angiogram, 2D  echo, carotid Dopplers, and transcranial Dopplers.      Catherine A. Orlin Hilding, M.D.  Electronically Signed     CAW/MEDQ  D:  10/21/2007  T:  10/22/2007  Job:  [id number]

## 2010-08-12 NOTE — H&P (Signed)
NAMENAETHAN, VIVO                ACCOUNT NO.:  [id number]   MEDICAL RECORD NO.:  [id number]          PATIENT TYPE:  IPS   LOCATION:  0507                          FACILITY:  [facility]   PHYSICIAN:  Anselm Jungling, MD  DATE OF BIRTH:  Nov 26, 1965   DATE OF ADMISSION:  05/04/2008  DATE OF DISCHARGE:                       PSYCHIATRIC ADMISSION ASSESSMENT   This is a 45 year old male on involuntarily commitment on May 04, 2008.   HISTORY OF PRESENT ILLNESS:  The patient is here on petition.  Papers  state the patient threatened to kill himself and others.  Picked up a  chair in the emergency room and attempted to leave.  He has a long  mental health history and several suicide attempts using alcohol and  marijuana, and is considered a danger to himself and others.  The chart  records indicate the patient has been drinking, was threatening himself  and others.  Did pick up a chair in the emergency room, making threats  to kill himself and others.  Also endorsing psychotic symptoms and  drinking heavily.   PRIMARY CARE Ugonna Keirsey:  Unknown.   MEDICAL PROBLEMS:  1. Hypothyroidism.  2. Gastritis.  3. Hypertension.   MEDICATIONS:  The patient was discharged on April 28, 2008 on the  following medications:  1. Prinivil 20 mg daily.  2. Levothyroxine 25 mcg daily.  3. Procardia XL 30 mg daily.  4. Geodon 80 mg at bedtime.  5. Protonix 40 mg at bedtime.   DRUG ALLERGIES:  ASPIRIN with throat swelling.   PHYSICAL EXAMINATION:  GENERAL:  This is a middle-aged male who was  assessed at [facility] Emergency Department which was reviewed.  No  significant physical findings.  However, the patient was very  argumentative, agitated and hostile.  Talking about being a Personal assistant  and killing everyone.  He was going to throw a chair.  He did receive  Ativan.  VITAL SIGNS:  Temperature 99.7, 121 heart rate, 24 respirations, blood  pressure 122/73, 96% saturated.   LABORATORY DATA:   Hemoglobin 12.8, hematocrit 36.6.  Urinalysis was  negative.  Alcohol level was 276, down to 88 at time of transfer.  His  acetaminophen level was less than 10.  Urine drug screen was negative.   MENTAL STATUS EXAM:  The patient offers little information.  However, he  is not hostile at this time.  Poor eye contact.  His speech is clear.  Offers again little information, nonspontaneous.  Endorsing auditory  hallucinations and seems to be somewhat guarded.  Thought process are  slow.  Poor judgment.  Poor insight.   AXIS I:  Bipolar disorder not otherwise specified.  Alcohol dependence.  AXIS II:  Features of antisocial personality disorder.  AXIS III:  Hypertension, gastritis and hypothyroidism.  AXIS IV:  Psychosocial problems, burden of illness, chronic substance  use.  AXIS V:  Current is 25.   PLAN:  Our plan is to detox the patient with Librium protocol.  We will  resume his medications.  Reinforce med compliance.  Work on relapse  prevention.  Case manager will assess his  living arrangements.  His  tentative length of stay at this time is 3-5 days.      Landry Corporal, N.P.      Anselm Jungling, MD  Electronically Signed    JO/MEDQ  D:  05/09/2008  T:  05/09/2008  Job:  [id number]

## 2010-08-12 NOTE — Discharge Summary (Signed)
[name], [name]                ACCOUNT NO.:  [id number]   MEDICAL RECORD NO.:  [id number]          PATIENT TYPE:  IPS   LOCATION:  0300                          FACILITY:  [facility]   PHYSICIAN:  Jasmine Pang, M.D. DATE OF BIRTH:  1966-01-17   DATE OF ADMISSION:  04/16/2008  DATE OF DISCHARGE:  04/20/2008                               DISCHARGE SUMMARY   IDENTIFICATION:  This is a 45 year old single male who was admitted on a  voluntary basis on April 16, 2008.  He was referred by the ED.  He is  currently living in [place], [place].   HISTORY OF PRESENT ILLNESS:  The patient complains of depression with  alcohol abuse.  He presented with suicidal ideation.  He had a plan.  He  admits he drinks to deal with stressors in his life.  He has been using  alcohol regularly up to 12-pack of beer daily, plus a fair amount of  liquor daily.  He states he sees his psychiatrist at the [facility].  He denies any drug use.  He is currently on Lexapro 20 mg and  Geodon 80 mg daily by the [facility].   PAST PSYCHIATRIC HISTORY:  As indicated above, the patient goes to the  [facility].  He is currently on Lexapro 20 mg daily and Geodon 80  mg daily.  He is not sure which doctor he sees there.   FAMILY HISTORY:  The patient denies any history of mood disorders or  substance abuse and his family.   ALCOHOL AND DRUG PROBLEMS:  The patient denies any drug use other than  his alcohol abuse.   MEDICAL PROBLEMS:  There were no acute medical problems.  The patient  has gastritis, hypertension, anemia, hypertriglyceridemia, and  hypothyroidism.   MEDICATIONS:  1. Lexapro 20 mg daily.  2. Lisinopril 20 mg daily.  3. Levothyroxine 25 mcg daily.  4. Nifedipine 30 mg daily.  5. Geodon 80 mg daily.  6. Carafate 1 g daily.   ALLERGIES:  He is allergic to ASPIRIN.   PHYSICAL FINDINGS:  There were no acute physical or medical problems  noted.  The patient was admitted to  the [facility] ED.   ADMISSION LABORATORIES:  Basic I-Stat Chem-8 panel was within normal  limits.  CBC was within normal limits.  Urinalysis was negative.  Alcohol level was 285.  Urine drug screen negative.   HOSPITAL COURSE:  He was admitted with diagnoses of mood disorder, not  otherwise specified, and alcohol dependence.  He was restarted on  Lexapro 20 mg daily, lisinopril 20 mg daily, levothyroxine 25 mcg daily,  and nifedipine 30 mg p.o. daily.  He was also started on Librium detox  protocol and Geodon 80 mg q.h.s.  The patient tolerated these  medications well with no significant side effects.  He initially was  quite reserved and resistant to talking.  In individual sessions, there  was psychomotor retardation.  He was disheveled.  Speech was soft and  slow.  He was drowsy and lethargic.  Mood was depressed and anxious.  Affect consistent with mood and anxiety level was moderate to severe.  There were no hallucinations noted.  He stated he lives at [facility] and he had begun to feel suicidal.  On April 18, 2008, sleep was still poor.  Mood was depressed and anxious.  There was  positive suicidal ideation not wanting to be here.  There was some  anxiety with withdrawal symptoms on the detox protocol.  On April 19, 2008, sleep had improved, was good.  Appetite was good.  Mood remained  depressed and anxious, but improved somewhat.  He continued to spend  much this time in bed with psychomotor retardation.  On April 20, 2008, mental status had improved markedly from admission status.  The  patient wanted to go home today.  He wanted to return to [facility].  His sleep was good.  Appetite was good.  Mood was  less depressed, less anxious.  Affect was consistent with mood.  There  was no suicidal or homicidal ideation.  No thoughts of self-injurious  behavior.  No auditory or visual hallucinations.  No paranoia or  delusions.  Thoughts  were logical and goal-directed.  Thought content no  predominant theme.  Cognitive was grossly intact.  There were no  withdrawal symptoms noted.  It was felt safe to discharge the patient  today.  He planned to return to the [facility] and  the social worker spoke with him to confirm the patient's return today.  They were planning to sent someone to pick him up.  He was scheduled an  appointment at the [facility].  He also states he was to return to  the [facility] for their CD IOP program Monday, Wednesdays, and  Fridays.  He was also given a list of current AA meetings and encouraged  to attend a 90/90 recovery programs.   DISCHARGE DIAGNOSES:  Axis I:  Bipolar disorder, not otherwise  specified.  Axis II:  None.  Axis III:  Gastritis, hypothyroidism, hypertension, anemia, and  dyslipidemia.  Axis IV:  Moderate to severe (problems with primary support group,  problems related to social environment, medical problems, burden of  psychiatric and chemical dependence illness, other psychosocial  problems).  Axis V:  Global assessment of functioning at discharge was 50.  GAF upon  admission was 35.  GAF highest past year was 60.   DISCHARGE PLANS:  There was no specific activity level or dietary  restrictions.   POSTHOSPITAL CARE PLANS:  The patient will see Dr. Joni Reining at the  [facility] on February 2nd at 10:30 a.m. and the [facility] CD  IOP January 25th at 12 p.m.   DISCHARGE MEDICATIONS:  1. Lexapro 20 mg daily.  2. Prinivil 20 mg daily.  3. Levothyroxine 25 mcg daily.  4. Procardia XL 30 mg tablets daily.  5. Geodon 80 mg at bedtime.      Jasmine Pang, M.D.  Electronically Signed     BHS/MEDQ  D:  04/20/2008  T:  04/21/2008  Job:  [id number]

## 2010-08-15 NOTE — Discharge Summary (Signed)
NAMEWESNER, ROZZELLE                ACCOUNT NO.:  [id number]   MEDICAL RECORD NO.:  [id number]          PATIENT TYPE:  IPS   LOCATION:  0302                          FACILITY:  [facility]   PHYSICIAN:  Jasmine Pang, M.D. DATE OF BIRTH:  1965-10-07   DATE OF ADMISSION:  05/12/2008  DATE OF DISCHARGE:  05/18/2008                               DISCHARGE SUMMARY   IDENTIFICATION:  This is a 45 year old African American male who has had  multiple prior admissions.   HISTORY OF PRESENT ILLNESS:  He has a history of substance abuse and  mood disorder.  He admitted he had relapsed and has suicidal ideation.  He was very depressed and stated he wanted help.   PAST PSYCHIATRIC HISTORY:  The patient is supposed to be seen at the  [facility].  He has had numerous admissions to our unit.  He had  been placed in an assisted living facility, but was kicked out  presumably for alcohol abuse.   MEDICAL PROBLEMS:  Positive for hypertension.   MEDICATIONS:  1. Lexapro 20 mg daily.  2. Geodon 80 mg b.i.d.  3. Levothyroxine 25 mcg daily.  4. Nifedipine 30 mg daily.  5. Lisinopril 20 mg daily.  6. Carafate 1 g at a.c. and h.s.   DRUG ALLERGIES:  No known drug allergies.   PHYSICAL FINDINGS:  There were no acute physical or medical problems  noted.   ADMISSION LABORATORIES:  The patient was just here recently and repeat  labs were not done.   HOSPITAL COURSE:  Upon admission, the patient was started on his home  medications of Lexapro 20 mg daily, Geodon 80 mg b.i.d., levothyroxine  25 mcg daily, nifedipine 30 mg daily, lisinopril 20 mg daily, Carafate 1  g a.c. and h.s. He was also started on trazodone 100 mg p.o. q.h.s.  p.r.n. insomnia may repeat x1 if needed.  On May 15, 2008, Geodon  was discontinued, instead he was placed on Zyprexa 10 mg p.o. q.h.s.  In  individual sessions, the patient was initially depressed.  He is with a  constricted affect.  He was cooperative.  He stated he  was having  suicidal ideation.  He was having auditory hallucinations talking like  a radio playing.  He is having positive visual hallucinations, seeing  shapes.  On May 15, 2008, he was less depressed and less anxious.  There was no suicidal ideation.  He continued to here like a radio  playing and see shadow people.  He stated he felt like people were  following him.  On May 16, 2008, mood was a little more depressed  and anxious.  There was psychomotor retardation with poor eye contact.  Speech was soft and slow.  On May 17, 2008, there was no  significant change in mental status.  He had had some nausea, but this  resolved and he was eating better.  He was lying in bed with his covers  over as his head when I attempted to talk with him.  On May 18, 2008, the patient was accepted into an [facility] in [place].  He  was planning to go to this and the hospital was going to give him bus  transportation.  He was given a list of AA meetings, encouraged to do a  90/90 treatment program.  Sleep was good.  Appetite was good.  Mood was  less depressed, less anxious.  Affect was consistent with mood.  There  was no suicidal or homicidal ideation.  No auditory or visual  hallucinations.  No paranoia or delusions.  Thoughts were logical and  goal-directed.  Thought content no predominant theme.  Cognitive was  grossly intact.  Insight fair.  Judgment fair.  Impulse control fair.  He was felt to be safe for discharge.  He was going to an [employer] [facility]  in [place] and wanted to leave today.   DISCHARGE DIAGNOSES:  Axis I:  Mood disorder, not otherwise specified,  polysubstance abuse.  Axis II:  Features of borderline personality disorder and antisocial  personality disorder.  Axis III:  Hypertension, hypothyroidism.  Axis IV:  Moderate (problems with primary support group, burden of  psychiatric illness, burden of chemical dependence illness).  Axis V:  Global  assessment of functioning was 50 upon discharge.  GAF  was 35 upon admission.  GAF highest past year was 60.   DISCHARGE PLANS:  There was no specific activity level or dietary  restrictions.   POSTHOSPITAL CARE PLANS:  The patient will go to an [address] in [place].  He has been accepted there.  He will also go to [facility]  in [place] on February 23rd at 10:30 a.m.   DISCHARGE MEDICATIONS:  1. Lexapro 20 mg daily.  2. Zyprexa 10 mg at bedtime.  3. Levothyroxine 25 mcg daily.  4. Lisinopril 20 mg daily.  5. Procardia XL 30 mg daily.  6. Carafate 1 mg before meals and at bedtime.      Jasmine Pang, M.D.  Electronically Signed     BHS/MEDQ  D:  06/08/2008  T:  06/09/2008  Job:  [id number]

## 2010-09-04 ENCOUNTER — Inpatient Hospital Stay (INDEPENDENT_AMBULATORY_CARE_PROVIDER_SITE_OTHER)
Admission: RE | Admit: 2010-09-04 | Discharge: 2010-09-04 | Disposition: A | Discharge status: Home / Self Care | Payer: Medicare Other | Source: Ambulatory Visit | Attending: Emergency Medicine | Admitting: Emergency Medicine

## 2010-09-04 DIAGNOSIS — F659 Paraphilia, unspecified: Secondary | ICD-10-CM

## 2010-09-04 DIAGNOSIS — H6120 Impacted cerumen, unspecified ear: Secondary | ICD-10-CM

## 2010-09-05 LAB — HIV ANTIBODY (ROUTINE TESTING W REFLEX): HIV: NONREACTIVE

## 2010-09-18 ENCOUNTER — Emergency Department (HOSPITAL_COMMUNITY)
Admission: EM | Admit: 2010-09-18 | Discharge: 2010-09-18 | Disposition: A | Discharge status: Home / Self Care | Payer: Medicare Other | Attending: Emergency Medicine | Admitting: Emergency Medicine

## 2010-09-18 DIAGNOSIS — K2920 Alcoholic gastritis without bleeding: Secondary | ICD-10-CM | POA: Insufficient documentation

## 2010-09-18 DIAGNOSIS — R10819 Abdominal tenderness, unspecified site: Secondary | ICD-10-CM | POA: Insufficient documentation

## 2010-09-18 DIAGNOSIS — I10 Essential (primary) hypertension: Secondary | ICD-10-CM | POA: Insufficient documentation

## 2010-09-18 DIAGNOSIS — F17200 Nicotine dependence, unspecified, uncomplicated: Secondary | ICD-10-CM | POA: Insufficient documentation

## 2010-09-18 LAB — CBC
MCH: 30.9 pg (ref 26.0–34.0)
MCHC: 33.7 g/dL (ref 30.0–36.0)
MCV: 91.6 fL (ref 78.0–100.0)
Platelets: 268 10*3/uL (ref 150–400)
RDW: 15.6 % — ABNORMAL HIGH (ref 11.5–15.5)
WBC: 5.6 10*3/uL (ref 4.0–10.5)

## 2010-09-18 LAB — URINALYSIS, ROUTINE W REFLEX MICROSCOPIC
Bilirubin Urine: NEGATIVE
Hgb urine dipstick: NEGATIVE
Nitrite: NEGATIVE
Specific Gravity, Urine: 1.012 (ref 1.005–1.030)
pH: 6.5 (ref 5.0–8.0)

## 2010-09-18 LAB — DIFFERENTIAL
Eosinophils Absolute: 0.3 10*3/uL (ref 0.0–0.7)
Eosinophils Relative: 5 % (ref 0–5)
Lymphs Abs: 2 10*3/uL (ref 0.7–4.0)
Monocytes Absolute: 0.5 10*3/uL (ref 0.1–1.0)
Monocytes Relative: 8 % (ref 3–12)

## 2010-09-18 LAB — COMPREHENSIVE METABOLIC PANEL
Albumin: 3.8 g/dL (ref 3.5–5.2)
Alkaline Phosphatase: 58 U/L (ref 39–117)
BUN: 9 mg/dL (ref 6–23)
Chloride: 109 mEq/L (ref 96–112)
Potassium: 3.9 mEq/L (ref 3.5–5.1)
Total Bilirubin: 0.1 mg/dL — ABNORMAL LOW (ref 0.3–1.2)

## 2010-09-18 LAB — LIPASE, BLOOD: Lipase: 11 U/L (ref 11–59)

## 2010-12-16 ENCOUNTER — Other Ambulatory Visit (HOSPITAL_COMMUNITY): Payer: Self-pay | Admitting: Family Medicine

## 2010-12-16 DIAGNOSIS — D381 Neoplasm of uncertain behavior of trachea, bronchus and lung: Secondary | ICD-10-CM

## 2010-12-18 ENCOUNTER — Ambulatory Visit (HOSPITAL_COMMUNITY)
Admission: RE | Admit: 2010-12-18 | Discharge: 2010-12-18 | Disposition: A | Discharge status: Home / Self Care | Payer: Medicare Other | Source: Ambulatory Visit | Attending: Family Medicine | Admitting: Family Medicine

## 2010-12-18 DIAGNOSIS — J984 Other disorders of lung: Secondary | ICD-10-CM | POA: Insufficient documentation

## 2010-12-18 DIAGNOSIS — D381 Neoplasm of uncertain behavior of trachea, bronchus and lung: Secondary | ICD-10-CM | POA: Insufficient documentation

## 2010-12-18 DIAGNOSIS — E278 Other specified disorders of adrenal gland: Secondary | ICD-10-CM | POA: Insufficient documentation

## 2010-12-18 DIAGNOSIS — I517 Cardiomegaly: Secondary | ICD-10-CM | POA: Insufficient documentation

## 2010-12-18 MED ORDER — IOHEXOL 300 MG/ML  SOLN
80.0000 mL | Freq: Once | INTRAMUSCULAR | Status: AC | PRN
  Administered 2010-12-18: 80 mL via INTRAVENOUS

## 2010-12-26 LAB — PROTIME-INR
INR: 0.9
INR: 1

## 2010-12-26 LAB — URINALYSIS, ROUTINE W REFLEX MICROSCOPIC
Bilirubin Urine: NEGATIVE
Hgb urine dipstick: NEGATIVE
Ketones, ur: NEGATIVE
Nitrite: NEGATIVE
Protein, ur: NEGATIVE
Specific Gravity, Urine: 1.004 — ABNORMAL LOW
Urobilinogen, UA: 0.2
pH: 7
pH: 7.5

## 2010-12-26 LAB — COMPREHENSIVE METABOLIC PANEL
ALT: 9
Albumin: 3.7
BUN: 10
CO2: 25
Calcium: 8.7
Creatinine, Ser: 0.94
Creatinine, Ser: 1.12
GFR calc non Af Amer: >60
Glucose, Bld: 122 — ABNORMAL HIGH
Glucose, Bld: 104 — ABNORMAL HIGH
Total Bilirubin: 0.4
Total Protein: 6.3

## 2010-12-26 LAB — DIFFERENTIAL
Basophils Absolute: 0.1
Basophils Absolute: 0.1
Eosinophils Absolute: 0.2
Lymphocytes Relative: 19
Lymphocytes Relative: 13
Lymphs Abs: 1.4
Monocytes Absolute: 0.7
Monocytes Relative: 12
Neutro Abs: 3.8
Neutrophils Relative %: 66
Neutrophils Relative %: 79 — ABNORMAL HIGH

## 2010-12-26 LAB — CBC
Hemoglobin: 12.9 — ABNORMAL LOW
MCHC: 32.7
MCV: 82.2
Platelets: 282
RBC: 4.79
RDW: 17.6 — ABNORMAL HIGH

## 2010-12-26 LAB — URINE CULTURE
Colony Count: NO GROWTH
Culture: NO GROWTH

## 2010-12-26 LAB — RAPID URINE DRUG SCREEN, HOSP PERFORMED: Barbiturates: NOT DETECTED

## 2010-12-26 LAB — LIPID PANEL
Cholesterol: 152
LDL Cholesterol: 102 — ABNORMAL HIGH
Total CHOL/HDL Ratio: 4.6

## 2010-12-26 LAB — CK TOTAL AND CKMB (NOT AT ARMC)
CK, MB: 4.2 — ABNORMAL HIGH
Relative Index: 1.9

## 2010-12-26 LAB — TROPONIN I: Troponin I: 0.01

## 2010-12-26 LAB — LIPASE, BLOOD: Lipase: 17

## 2010-12-26 LAB — APTT: aPTT: 29

## 2010-12-29 LAB — COMPREHENSIVE METABOLIC PANEL
ALT: 12
AST: 26
Albumin: 3.8
Calcium: 8.6
Creatinine, Ser: 0.96
GFR calc Af Amer: >60
Sodium: 140
Total Protein: 5.8 — ABNORMAL LOW

## 2010-12-29 LAB — DIFFERENTIAL
Eosinophils Absolute: 0.2
Eosinophils Relative: 2
Lymphocytes Relative: 26
Lymphs Abs: 1.9
Monocytes Relative: 8

## 2010-12-29 LAB — CBC
MCHC: 33.1
Platelets: 256
RBC: 4.49
RDW: 18.5 — ABNORMAL HIGH

## 2010-12-30 LAB — CBC
HCT: 40.8
MCHC: 32.7
MCV: 83.4
Platelets: 282
RBC: 4.89
WBC: 10.6 — ABNORMAL HIGH

## 2010-12-30 LAB — DIFFERENTIAL
Basophils Relative: 1
Eosinophils Absolute: 0.1
Eosinophils Relative: 1
Lymphs Abs: 1.1
Monocytes Relative: 4
Neutrophils Relative %: 84 — ABNORMAL HIGH

## 2010-12-30 LAB — BASIC METABOLIC PANEL
BUN: 5 — ABNORMAL LOW
CO2: 22
Chloride: 109
Creatinine, Ser: 0.83
Potassium: 4.1

## 2010-12-30 LAB — POCT CARDIAC MARKERS: Myoglobin, poc: 81.8

## 2010-12-31 LAB — POCT I-STAT, CHEM 8
Calcium, Ion: 1.15
Creatinine, Ser: 1.1
Glucose, Bld: 101 — ABNORMAL HIGH
HCT: 41
Hemoglobin: 13.9
TCO2: 22

## 2010-12-31 LAB — RAPID URINE DRUG SCREEN, HOSP PERFORMED
Amphetamines: NOT DETECTED
Barbiturates: NOT DETECTED
Benzodiazepines: NOT DETECTED
Opiates: NOT DETECTED
Tetrahydrocannabinol: NOT DETECTED

## 2011-01-02 LAB — RAPID URINE DRUG SCREEN, HOSP PERFORMED
Amphetamines: NOT DETECTED
Barbiturates: NOT DETECTED
Barbiturates: NOT DETECTED
Benzodiazepines: POSITIVE — AB
Cocaine: NOT DETECTED
Cocaine: NOT DETECTED
Opiates: NOT DETECTED
Opiates: NOT DETECTED
Tetrahydrocannabinol: NOT DETECTED
Tetrahydrocannabinol: NOT DETECTED
Tetrahydrocannabinol: NOT DETECTED

## 2011-01-02 LAB — DIFFERENTIAL
Basophils Absolute: 0 10*3/uL (ref 0.0–0.1)
Basophils Relative: 0 % (ref 0–1)
Eosinophils Relative: 3 % (ref 0–5)
Lymphocytes Relative: 25 % (ref 12–46)
Lymphocytes Relative: 17 % (ref 12–46)
Lymphocytes Relative: 9 % — ABNORMAL LOW (ref 12–46)
Lymphs Abs: 2.7 10*3/uL (ref 0.7–4.0)
Lymphs Abs: 1.3 10*3/uL (ref 0.7–4.0)
Monocytes Absolute: 0.4 10*3/uL (ref 0.1–1.0)
Monocytes Absolute: 0.4 10*3/uL (ref 0.1–1.0)
Monocytes Relative: 4 % (ref 3–12)
Monocytes Relative: 4 % (ref 3–12)
Monocytes Relative: 6 % (ref 3–12)
Neutro Abs: 7.3 10*3/uL (ref 1.7–7.7)
Neutro Abs: 12.5 10*3/uL — ABNORMAL HIGH (ref 1.7–7.7)
Neutrophils Relative %: 85 % — ABNORMAL HIGH (ref 43–77)

## 2011-01-02 LAB — CBC
HCT: 43 % (ref 39.0–52.0)
Hemoglobin: 13.5 g/dL (ref 13.0–17.0)
Hemoglobin: 14.2 g/dL (ref 13.0–17.0)
Hemoglobin: 14.2 g/dL (ref 13.0–17.0)
MCHC: 33 g/dL (ref 30.0–36.0)
MCHC: 33.5 g/dL (ref 30.0–36.0)
MCV: 86.5 fL (ref 78.0–100.0)
Platelets: 278 10*3/uL (ref 150–400)
RBC: 4.74 MIL/uL (ref 4.22–5.81)
RBC: 5.07 MIL/uL (ref 4.22–5.81)
RDW: 17.7 % — ABNORMAL HIGH (ref 11.5–15.5)
RDW: 16.8 % — ABNORMAL HIGH (ref 11.5–15.5)
WBC: 10.8 10*3/uL — ABNORMAL HIGH (ref 4.0–10.5)
WBC: 8.6 10*3/uL (ref 4.0–10.5)
WBC: 9.9 10*3/uL (ref 4.0–10.5)

## 2011-01-02 LAB — COMPREHENSIVE METABOLIC PANEL
ALT: 12 U/L (ref 0–53)
AST: 21 U/L (ref 0–37)
Albumin: 4 g/dL (ref 3.5–5.2)
Alkaline Phosphatase: 95 U/L (ref 39–117)
BUN: 10 mg/dL (ref 6–23)
Calcium: 8.8 mg/dL (ref 8.4–10.5)
Chloride: 110 mEq/L (ref 96–112)
Creatinine, Ser: 0.77 mg/dL (ref 0.4–1.5)
Creatinine, Ser: 0.92 mg/dL (ref 0.4–1.5)
GFR calc Af Amer: >60 mL/min (ref >60)
GFR calc non Af Amer: >60 mL/min (ref >60)
Glucose, Bld: 127 mg/dL — ABNORMAL HIGH (ref 70–99)
Potassium: 3.6 mEq/L (ref 3.5–5.1)
Sodium: 134 mEq/L — ABNORMAL LOW (ref 135–145)
Total Bilirubin: 0.4 mg/dL (ref 0.3–1.2)
Total Protein: 6.7 g/dL (ref 6.0–8.3)
Total Protein: 6.8 g/dL (ref 6.0–8.3)

## 2011-01-02 LAB — ETHANOL
Alcohol, Ethyl (B): 157 mg/dL — ABNORMAL HIGH (ref 0–10)
Alcohol, Ethyl (B): 292 mg/dL — ABNORMAL HIGH (ref 0–10)
Alcohol, Ethyl (B): 153 mg/dL — ABNORMAL HIGH (ref 0–10)

## 2011-01-02 LAB — URINALYSIS, ROUTINE W REFLEX MICROSCOPIC
Bilirubin Urine: NEGATIVE
Glucose, UA: NEGATIVE mg/dL
Hgb urine dipstick: NEGATIVE
Specific Gravity, Urine: 1.007 (ref 1.005–1.030)
pH: 6 (ref 5.0–8.0)

## 2011-01-02 LAB — POCT I-STAT, CHEM 8
BUN: 7 mg/dL (ref 6–23)
Calcium, Ion: 1.19 mmol/L (ref 1.12–1.32)
Chloride: 99 mEq/L (ref 96–112)
Creatinine, Ser: 1 mg/dL (ref 0.4–1.5)
Glucose, Bld: 110 mg/dL — ABNORMAL HIGH (ref 70–99)
HCT: 46 % (ref 39.0–52.0)
Hemoglobin: 15.6 g/dL (ref 13.0–17.0)
Potassium: 3.4 mEq/L — ABNORMAL LOW (ref 3.5–5.1)
Sodium: 139 mEq/L (ref 135–145)
TCO2: 26 mmol/L (ref 0–100)

## 2011-01-02 LAB — BASIC METABOLIC PANEL
CO2: 26 mEq/L (ref 19–32)
Calcium: 8.7 mg/dL (ref 8.4–10.5)
Chloride: 107 mEq/L (ref 96–112)
GFR calc Af Amer: >60 mL/min (ref >60)
Sodium: 140 mEq/L (ref 135–145)

## 2011-01-02 LAB — LIPASE, BLOOD: Lipase: 15 U/L (ref 11–59)

## 2011-10-27 DIAGNOSIS — M1710 Unilateral primary osteoarthritis, unspecified knee: Secondary | ICD-10-CM | POA: Diagnosis not present

## 2013-04-06 DIAGNOSIS — I10 Essential (primary) hypertension: Secondary | ICD-10-CM | POA: Diagnosis not present

## 2013-04-06 DIAGNOSIS — F3289 Other specified depressive episodes: Secondary | ICD-10-CM | POA: Diagnosis not present

## 2013-04-06 DIAGNOSIS — K219 Gastro-esophageal reflux disease without esophagitis: Secondary | ICD-10-CM | POA: Diagnosis not present

## 2013-04-06 DIAGNOSIS — J449 Chronic obstructive pulmonary disease, unspecified: Secondary | ICD-10-CM | POA: Diagnosis not present

## 2013-05-04 DIAGNOSIS — F3289 Other specified depressive episodes: Secondary | ICD-10-CM | POA: Diagnosis not present

## 2013-05-04 DIAGNOSIS — M25579 Pain in unspecified ankle and joints of unspecified foot: Secondary | ICD-10-CM | POA: Diagnosis not present

## 2013-05-04 DIAGNOSIS — I10 Essential (primary) hypertension: Secondary | ICD-10-CM | POA: Diagnosis not present

## 2013-05-04 DIAGNOSIS — K219 Gastro-esophageal reflux disease without esophagitis: Secondary | ICD-10-CM | POA: Diagnosis not present

## 2013-06-22 DIAGNOSIS — I10 Essential (primary) hypertension: Secondary | ICD-10-CM | POA: Diagnosis not present

## 2013-06-22 DIAGNOSIS — K219 Gastro-esophageal reflux disease without esophagitis: Secondary | ICD-10-CM | POA: Diagnosis not present

## 2013-06-22 DIAGNOSIS — M25579 Pain in unspecified ankle and joints of unspecified foot: Secondary | ICD-10-CM | POA: Diagnosis not present

## 2013-06-22 DIAGNOSIS — J449 Chronic obstructive pulmonary disease, unspecified: Secondary | ICD-10-CM | POA: Diagnosis not present

## 2013-07-20 DIAGNOSIS — J449 Chronic obstructive pulmonary disease, unspecified: Secondary | ICD-10-CM | POA: Diagnosis not present

## 2013-07-20 DIAGNOSIS — M25579 Pain in unspecified ankle and joints of unspecified foot: Secondary | ICD-10-CM | POA: Diagnosis not present

## 2013-07-20 DIAGNOSIS — F17200 Nicotine dependence, unspecified, uncomplicated: Secondary | ICD-10-CM | POA: Diagnosis not present

## 2013-07-20 DIAGNOSIS — I10 Essential (primary) hypertension: Secondary | ICD-10-CM | POA: Diagnosis not present

## 2013-09-22 DIAGNOSIS — I10 Essential (primary) hypertension: Secondary | ICD-10-CM | POA: Diagnosis not present

## 2013-09-22 DIAGNOSIS — I739 Peripheral vascular disease, unspecified: Secondary | ICD-10-CM | POA: Diagnosis not present

## 2013-10-11 DIAGNOSIS — F200 Paranoid schizophrenia: Secondary | ICD-10-CM | POA: Diagnosis not present

## 2013-11-01 ENCOUNTER — Emergency Department (HOSPITAL_COMMUNITY)
Admission: EM | Admit: 2013-11-01 | Discharge: 2013-11-01 | Disposition: A | Discharge status: Home / Self Care | Payer: Medicare Other | Attending: Emergency Medicine | Admitting: Emergency Medicine

## 2013-11-01 ENCOUNTER — Encounter (HOSPITAL_COMMUNITY): Payer: Self-pay | Admitting: Emergency Medicine

## 2013-11-01 ENCOUNTER — Emergency Department (HOSPITAL_COMMUNITY): Payer: Medicare Other

## 2013-11-01 DIAGNOSIS — Y939 Activity, unspecified: Secondary | ICD-10-CM | POA: Insufficient documentation

## 2013-11-01 DIAGNOSIS — IMO0002 Reserved for concepts with insufficient information to code with codable children: Secondary | ICD-10-CM | POA: Insufficient documentation

## 2013-11-01 DIAGNOSIS — M19279 Secondary osteoarthritis, unspecified ankle and foot: Secondary | ICD-10-CM | POA: Insufficient documentation

## 2013-11-01 DIAGNOSIS — Z8673 Personal history of transient ischemic attack (TIA), and cerebral infarction without residual deficits: Secondary | ICD-10-CM | POA: Diagnosis not present

## 2013-11-01 DIAGNOSIS — Y929 Unspecified place or not applicable: Secondary | ICD-10-CM | POA: Diagnosis not present

## 2013-11-01 DIAGNOSIS — I10 Essential (primary) hypertension: Secondary | ICD-10-CM | POA: Diagnosis not present

## 2013-11-01 DIAGNOSIS — F1010 Alcohol abuse, uncomplicated: Secondary | ICD-10-CM | POA: Diagnosis not present

## 2013-11-01 DIAGNOSIS — S8990XA Unspecified injury of unspecified lower leg, initial encounter: Secondary | ICD-10-CM | POA: Insufficient documentation

## 2013-11-01 DIAGNOSIS — M19079 Primary osteoarthritis, unspecified ankle and foot: Secondary | ICD-10-CM | POA: Diagnosis not present

## 2013-11-01 DIAGNOSIS — M25579 Pain in unspecified ankle and joints of unspecified foot: Secondary | ICD-10-CM | POA: Diagnosis not present

## 2013-11-01 DIAGNOSIS — S99919A Unspecified injury of unspecified ankle, initial encounter: Principal | ICD-10-CM

## 2013-11-01 DIAGNOSIS — S99929A Unspecified injury of unspecified foot, initial encounter: Secondary | ICD-10-CM | POA: Diagnosis present

## 2013-11-01 DIAGNOSIS — M19172 Post-traumatic osteoarthritis, left ankle and foot: Secondary | ICD-10-CM

## 2013-11-01 DIAGNOSIS — R296 Repeated falls: Secondary | ICD-10-CM | POA: Insufficient documentation

## 2013-11-01 DIAGNOSIS — T148XXA Other injury of unspecified body region, initial encounter: Secondary | ICD-10-CM | POA: Diagnosis not present

## 2013-11-01 DIAGNOSIS — S99912A Unspecified injury of left ankle, initial encounter: Secondary | ICD-10-CM

## 2013-11-01 DIAGNOSIS — Z79899 Other long term (current) drug therapy: Secondary | ICD-10-CM | POA: Insufficient documentation

## 2013-11-01 MED ORDER — OXYCODONE-ACETAMINOPHEN 5-325 MG PO TABS
1.0000 | ORAL_TABLET | Freq: Once | ORAL | Status: AC
  Administered 2013-11-01: 1 via ORAL
  Filled 2013-11-01: qty 1

## 2013-11-01 MED ORDER — OXYCODONE-ACETAMINOPHEN 5-325 MG PO TABS: 2.0000 | ORAL_TABLET | ORAL | Status: DC | PRN

## 2013-11-01 NOTE — ED Provider Notes (Signed)
CSN: [id number]     Arrival date & time 11/01/13  1114 History   First MD Initiated Contact with Patient 11/01/13 1141     Chief Complaint  Patient presents with  . Fall  . Back Pain  . Ankle Pain     (Consider location/radiation/quality/duration/timing/severity/associated sxs/prior Treatment) HPI  [name] is a 48 y.o. male who presents for evaluation of left ankle pain, injured this morning, when he misstepped, and fell. He has done this frequently in the past.  He has an old injury to the left ankle. He also injured his low back, in the fall this morning. No head or neck injury. He presents by EMS. Was transported  with immobilization. There are no other known modifying factors.    Past Medical History  Diagnosis Date  . Hypertension   . Alcoholism     pt is also bi polar  . Stroke 05/15/2010    pt states stroke in 2011   History reviewed. No pertinent past surgical history. History reviewed. No pertinent family history. History  Substance Use Topics  . Smoking status: Heavy Tobacco Smoker    Types: Cigarettes  . Smokeless tobacco: Not on file  . Alcohol Use: No    Review of Systems  All other systems reviewed and are negative.     Allergies  Aspirin  Home Medications   Prior to Admission medications   Medication Sig Start Date End Date Taking? Authorizing Provider  albuterol (PROVENTIL HFA;VENTOLIN HFA) 108 (90 BASE) MCG/ACT inhaler Inhale 1 puff into the lungs 2 (two) times daily as needed for wheezing or shortness of breath.   Yes Historical Provider, MD  lisinopril (PRINIVIL,ZESTRIL) 10 MG tablet Take 10 mg by mouth daily.   Yes Historical Provider, MD  sucralfate (CARAFATE) 1 G tablet Take 1 g by mouth 4 (four) times daily.   Yes Historical Provider, MD  pindolol (VISKEN) 5 MG tablet Take 5 mg by mouth 2 (two) times daily.    Historical Provider, MD   BP 128/79  Pulse 94  Temp(Src) 98.7 F (37.1 C) (Oral)  Resp 16  Ht 5\' 9"  (1.753 m)  Wt 206 lb  (93.441 kg)  BMI 30.41 kg/m2  SpO2 100% Physical Exam  Nursing note and vitals reviewed. Constitutional: He is oriented to person, place, and time. He appears well-developed and well-nourished.  HENT:  Head: Normocephalic and atraumatic.  Right Ear: External ear normal.  Left Ear: External ear normal.  Eyes: Conjunctivae and EOM are normal. Pupils are equal, round, and reactive to light.  Neck: Normal range of motion and phonation normal. Neck supple.  Cardiovascular: Normal rate, regular rhythm, normal heart sounds and intact distal pulses.   Pulmonary/Chest: Effort normal and breath sounds normal. He exhibits no bony tenderness.  Abdominal: Soft. There is no tenderness.  Musculoskeletal:  Left ankle, tenderness, swelling, primarily laterally. Ankle joint is grossly stable. There is no tenderness of the left knee or hip. There is mild lumbar tenderness, normal range of motion of the low back. There is no cervical or thoracic spine tenderness.  Neurological: He is alert and oriented to person, place, and time. No cranial nerve deficit or sensory deficit. He exhibits normal muscle tone. Coordination normal.  Skin: Skin is warm, dry and intact.  Psychiatric: He has a normal mood and affect. His behavior is normal. Judgment and thought content normal.    ED Course  Procedures (including critical care time) Medications  oxyCODONE-acetaminophen (PERCOCET/ROXICET) 5-325 MG per tablet 1  tablet (not administered)    Patient Vitals for the past 24 hrs:  BP Temp Temp src Pulse Resp SpO2 Height Weight  11/01/13 1122 128/79 mmHg 98.7 F (37.1 C) Oral 94 16 100 % 5\' 9"  (1.753 m) 206 lb (93.441 kg)   Splint applied per Ortho Tech   Labs Review Labs Reviewed - No data to display  Imaging Review Dg Ankle Complete Left  11/01/2013   CLINICAL DATA:  Pain post trauma  EXAM: LEFT ANKLE COMPLETE - 3+ VIEW  COMPARISON:  None.  FINDINGS: Frontal, oblique, and lateral views were obtained. There is  evidence of old trauma in the lateral malleolar region with remodeling. There is evidence of old trauma in the distal tibiofibular syndesmosis with calcification. There is no acute fracture. There is soft tissue swelling with a joint effusion. There is moderate generalized osteoarthritic change. There is evidence of an old avulsion along the dorsal navicular with remodeling. There is a benign exostosis along the dorsal distal talus. The ankle mortise appears grossly intact. There is an inferior calcaneal spur.  IMPRESSION: Evidence of old trauma with remodeling. Extensive osteoarthritic change. Soft tissue swelling with effusion raises question of ligamentous injury. No acute fracture appreciable on this study. The ankle mortise appears grossly intact.   Electronically Signed   By: Lowella Grip M.D.   On: 11/01/2013 12:15     EKG Interpretation None      MDM   Final diagnoses:  Ankle injury, left, initial encounter  Post-traumatic osteoarthritis of left ankle    Ankle sprain without fracture, fall, without serious injury   Nursing Notes Reviewed/ Care Coordinated Applicable Imaging Reviewed Interpretation of Laboratory Data incorporated into ED treatment  The patient appears reasonably screened and/or stabilized for discharge and I doubt any other medical condition or other [facility] requiring further screening, evaluation, or treatment in the ED at this time prior to discharge.  Plan: Home Medications- Percocet; Home Treatments- Spint prn; return here if the recommended treatment, does not improve the symptoms; Recommended follow up- Ortho f/u 1 week    Richarda Blade, MD 11/01/13 1249

## 2013-11-01 NOTE — ED Notes (Signed)
Pt from home, pt fell this morning after taking a wrong step.  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  Supposed to use cane and was not prior to fall.  Left ankle is swollen.  Pt states normal to have swelling but not this bad.  Pt does also c/o lower back pain.  Pt with lsb and cervical brace in place.  No neck pain reported. HX HTN.  Vitals:  148/98, hr 90, resp 16, pain 8/10 ankle, back 6/10.

## 2013-11-01 NOTE — Discharge Instructions (Signed)
Arthritis, Nonspecific °Arthritis is inflammation of a joint. This usually means pain, redness, warmth or swelling are present. One or more joints may be involved. There are a number of types of arthritis. Your caregiver may not be able to tell what type of arthritis you have right away. °CAUSES  °The most common cause of arthritis is the wear and tear on the joint (osteoarthritis). This causes damage to the cartilage, which can break down over time. The knees, hips, back and neck are most often affected by this type of arthritis. °Other types of arthritis and common causes of joint pain include: °· Sprains and other injuries near the joint. Sometimes minor sprains and injuries cause pain and swelling that develop hours later. °· Rheumatoid arthritis. This affects hands, feet and knees. It usually affects both sides of your body at the same time. It is often associated with chronic ailments, fever, weight loss and general weakness. °· Crystal arthritis. Gout and pseudo gout can cause occasional acute severe pain, redness and swelling in the foot, ankle, or knee. °· Infectious arthritis. Bacteria can get into a joint through a break in overlying skin. This can cause infection of the joint. Bacteria and viruses can also spread through the blood and affect your joints. °· Drug, infectious and allergy reactions. Sometimes joints can become mildly painful and slightly swollen with these types of illnesses. °SYMPTOMS  °· Pain is the main symptom. °· Your joint or joints can also be red, swollen and warm or hot to the touch. °· You may have a fever with certain types of arthritis, or even feel overall ill. °· The joint with arthritis will hurt with movement. Stiffness is present with some types of arthritis. °DIAGNOSIS  °Your caregiver will suspect arthritis based on your description of your symptoms and on your exam. Testing may be needed to find the type of arthritis: °· Blood and sometimes urine tests. °· X-ray tests  and sometimes CT or MRI scans. °· Removal of fluid from the joint (arthrocentesis) is done to check for bacteria, crystals or other causes. Your caregiver (or a specialist) will numb the area over the joint with a local anesthetic, and use a needle to remove joint fluid for examination. This procedure is only minimally uncomfortable. °· Even with these tests, your caregiver may not be able to tell what kind of arthritis you have. Consultation with a specialist (rheumatologist) may be helpful. °TREATMENT  °Your caregiver will discuss with you treatment specific to your type of arthritis. If the specific type cannot be determined, then the following general recommendations may apply. °Treatment of severe joint pain includes: °· Rest. °· Elevation. °· Anti-inflammatory medication (for example, ibuprofen) may be prescribed. Avoiding activities that cause increased pain. °· Only take over-the-counter or prescription medicines for pain and discomfort as recommended by your caregiver. °· Cold packs over an inflamed joint may be used for 10 to 15 minutes every hour. Hot packs sometimes feel better, but do not use overnight. Do not use hot packs if you are diabetic without your caregiver's permission. °· A cortisone shot into arthritic joints may help reduce pain and swelling. °· Any acute arthritis that gets worse over the next 1 to 2 days needs to be looked at to be sure there is no joint infection. °Long-term arthritis treatment involves modifying activities and lifestyle to reduce joint stress jarring. This can include weight loss. Also, exercise is needed to nourish the joint cartilage and remove waste. This helps keep the muscles   around the joint strong. °HOME CARE INSTRUCTIONS  °· Do not take aspirin to relieve pain if gout is suspected. This elevates uric acid levels. °· Only take over-the-counter or prescription medicines for pain, discomfort or fever as directed by your caregiver. °· Rest the joint as much as  possible. °· If your joint is swollen, keep it elevated. °· Use crutches if the painful joint is in your leg. °· Drinking plenty of fluids may help for certain types of arthritis. °· Follow your caregiver's dietary instructions. °· Try low-impact exercise such as: °¨ Swimming. °¨ Water aerobics. °¨ Biking. °¨ Walking. °· Morning stiffness is often relieved by a warm shower. °· Put your joints through regular range-of-motion. °SEEK MEDICAL CARE IF:  °· You do not feel better in 24 hours or are getting worse. °· You have side effects to medications, or are not getting better with treatment. °SEEK IMMEDIATE MEDICAL CARE IF:  °· You have a fever. °· You develop severe joint pain, swelling or redness. °· Many joints are involved and become painful and swollen. °· There is severe back pain and/or leg weakness. °· You have loss of bowel or bladder control. °Document Released: 04/23/2004 Document Revised: 06/08/2011 Document Reviewed: 05/09/2008 °ExitCare® Patient Information ©2015 [employer]. This information is not intended to replace advice given to you by your health care provider. Make sure you discuss any questions you have with your health care provider. ° °

## 2013-11-01 NOTE — ED Notes (Signed)
Bed: XJ15 Expected date:  Expected time:  Means of arrival:  Comments: Fall, immobilized

## 2013-11-02 ENCOUNTER — Emergency Department (HOSPITAL_COMMUNITY)
Admission: EM | Admit: 2013-11-02 | Discharge: 2013-11-02 | Disposition: A | Discharge status: Home / Self Care | Payer: Medicare Other | Attending: Emergency Medicine | Admitting: Emergency Medicine

## 2013-11-02 ENCOUNTER — Encounter (HOSPITAL_COMMUNITY): Payer: Self-pay | Admitting: Emergency Medicine

## 2013-11-02 DIAGNOSIS — Z79899 Other long term (current) drug therapy: Secondary | ICD-10-CM | POA: Insufficient documentation

## 2013-11-02 DIAGNOSIS — I10 Essential (primary) hypertension: Secondary | ICD-10-CM | POA: Insufficient documentation

## 2013-11-02 DIAGNOSIS — Z8673 Personal history of transient ischemic attack (TIA), and cerebral infarction without residual deficits: Secondary | ICD-10-CM | POA: Insufficient documentation

## 2013-11-02 DIAGNOSIS — F17200 Nicotine dependence, unspecified, uncomplicated: Secondary | ICD-10-CM | POA: Insufficient documentation

## 2013-11-02 DIAGNOSIS — F1020 Alcohol dependence, uncomplicated: Secondary | ICD-10-CM | POA: Insufficient documentation

## 2013-11-02 DIAGNOSIS — R404 Transient alteration of awareness: Secondary | ICD-10-CM | POA: Diagnosis not present

## 2013-11-02 DIAGNOSIS — F10920 Alcohol use, unspecified with intoxication, uncomplicated: Secondary | ICD-10-CM

## 2013-11-02 LAB — COMPREHENSIVE METABOLIC PANEL
ALBUMIN: 3.7 g/dL (ref 3.5–5.2)
ALK PHOS: 129 U/L — AB (ref 39–117)
ALT: 11 U/L (ref 0–53)
ANION GAP: 12 (ref 5–15)
AST: 42 U/L — ABNORMAL HIGH (ref 0–37)
BUN: 7 mg/dL (ref 6–23)
CO2: 21 mEq/L (ref 19–32)
Calcium: 8.2 mg/dL — ABNORMAL LOW (ref 8.4–10.5)
Chloride: 108 mEq/L (ref 96–112)
Creatinine, Ser: 1.01 mg/dL (ref 0.50–1.35)
GFR calc Af Amer: >90 mL/min (ref >90)
GFR calc non Af Amer: 86 mL/min — ABNORMAL LOW (ref >90)
Glucose, Bld: 99 mg/dL (ref 70–99)
POTASSIUM: 4.1 meq/L (ref 3.7–5.3)
Sodium: 141 mEq/L (ref 137–147)
TOTAL PROTEIN: 7.4 g/dL (ref 6.0–8.3)
Total Bilirubin: <0.2 mg/dL — ABNORMAL LOW (ref 0.3–1.2)

## 2013-11-02 LAB — CBC WITH DIFFERENTIAL/PLATELET
BASOS PCT: 1 % (ref 0–1)
Basophils Absolute: 0 10*3/uL (ref 0.0–0.1)
EOS ABS: 0.1 10*3/uL (ref 0.0–0.7)
Eosinophils Relative: 2 % (ref 0–5)
HCT: 43.6 % (ref 39.0–52.0)
HEMOGLOBIN: 14.8 g/dL (ref 13.0–17.0)
Lymphocytes Relative: 35 % (ref 12–46)
Lymphs Abs: 2.2 10*3/uL (ref 0.7–4.0)
MCH: 29.8 pg (ref 26.0–34.0)
MCHC: 33.9 g/dL (ref 30.0–36.0)
MCV: 87.7 fL (ref 78.0–100.0)
MONOS PCT: 6 % (ref 3–12)
Monocytes Absolute: 0.4 10*3/uL (ref 0.1–1.0)
NEUTROS PCT: 56 % (ref 43–77)
Neutro Abs: 3.7 10*3/uL (ref 1.7–7.7)
PLATELETS: 247 10*3/uL (ref 150–400)
RBC: 4.97 MIL/uL (ref 4.22–5.81)
RDW: 15.1 % (ref 11.5–15.5)
WBC: 6.4 10*3/uL (ref 4.0–10.5)

## 2013-11-02 LAB — RAPID URINE DRUG SCREEN, HOSP PERFORMED
Amphetamines: NOT DETECTED
Barbiturates: NOT DETECTED
Benzodiazepines: NOT DETECTED
Cocaine: NOT DETECTED
Opiates: NOT DETECTED
Tetrahydrocannabinol: NOT DETECTED

## 2013-11-02 LAB — URINALYSIS, ROUTINE W REFLEX MICROSCOPIC
Bilirubin Urine: NEGATIVE
Glucose, UA: NEGATIVE mg/dL
Hgb urine dipstick: NEGATIVE
KETONES UR: NEGATIVE mg/dL
LEUKOCYTES UA: NEGATIVE
NITRITE: NEGATIVE
PH: 6.5 (ref 5.0–8.0)
PROTEIN: NEGATIVE mg/dL
Specific Gravity, Urine: 1.008 (ref 1.005–1.030)
UROBILINOGEN UA: 0.2 mg/dL (ref 0.0–1.0)

## 2013-11-02 LAB — ACETAMINOPHEN LEVEL

## 2013-11-02 LAB — TROPONIN I

## 2013-11-02 LAB — ETHANOL: ALCOHOL ETHYL (B): 344 mg/dL — AB (ref 0–11)

## 2013-11-02 LAB — PROTIME-INR
INR: 0.95 (ref 0.00–1.49)
PROTHROMBIN TIME: 12.7 s (ref 11.6–15.2)

## 2013-11-02 LAB — SALICYLATE LEVEL

## 2013-11-02 MED ORDER — SODIUM CHLORIDE 0.9 % IV BOLUS (SEPSIS)
500.0000 mL | Freq: Once | INTRAVENOUS | Status: AC
  Administered 2013-11-02: 500 mL via INTRAVENOUS

## 2013-11-02 MED ORDER — SODIUM CHLORIDE 0.9 % IV SOLN
INTRAVENOUS | Status: DC
  Administered 2013-11-02: 13:00:00 via INTRAVENOUS

## 2013-11-02 NOTE — ED Notes (Signed)
Pt has belongings bag with black shirt, black shorts, underwear, and socks by nurses station.

## 2013-11-02 NOTE — ED Notes (Addendum)
Pt pulled self off equipment, monitor, and IV. Pt ambulated at bedside to use urinal. Pt alert and steady. Pt not able to correctly identify date and time; pt alert and oriented otherwise. Eulis Foster MD denies need for another IV at present time.

## 2013-11-02 NOTE — ED Notes (Signed)
Pt status same as arrival. Pt resting at present time and awakes with verbal. Pt reports it is "Wedneday; January; 2001" as responding to staff. Pt alert to self and situation.

## 2013-11-02 NOTE — ED Notes (Addendum)
Per EMS pt took 20 tablets of 5-325 mg percocet and ETOH. Pt reports SI at first then reports accidental. Pt alert and oriented to self and situation but not date or time. Upon assessment pain verbalizes is not in pain. Pt pupils dialated but brisk upon assessment. Pt falls asleep easily upon assessment but awakes with verbal.

## 2013-11-02 NOTE — ED Notes (Signed)
Two attempts to start IV by this RN. Unable to draw labs from IV site.

## 2013-11-02 NOTE — ED Provider Notes (Signed)
Patient signed out pending sobering up.  4:57 PM Was asked by nursing to reassess the patient. Patient walking the hallways and trying to leave with his gown on. Patient states that he is ready to go home. His speech is fluent and he is walking unassisted. Patient will be given food and scrubs. Feel he is clinically improved and can be discharged home.  Merryl Hacker, MD 11/02/13 [phone]

## 2013-11-02 NOTE — ED Notes (Signed)
NT unable to draw labs post 2 attempts. Phelbotomy called to attempt labs.

## 2013-11-02 NOTE — ED Notes (Addendum)
Phlebotomy at bedside. Pt placed on 2 lpm O2 via [place] related to O2 saturation 90% on RA.

## 2013-11-02 NOTE — ED Notes (Signed)
Pt ambulated out in hallway, states he wants to go home

## 2013-11-02 NOTE — ED Provider Notes (Signed)
CSN: [id number]     Arrival date & time 11/02/13  1213 History   First MD Initiated Contact with Patient 11/02/13 1215     Chief Complaint  Patient presents with  . Drug Overdose     (Consider location/radiation/quality/duration/timing/severity/associated sxs/prior Treatment) HPI  [name] is a 48 y.o. male presents by EMS for evaluation. Apparently, he was seen this morning by home health, and found to be unconscious, so they called EMS. They report that this morning, that he possibly took all of his Percocet pills, that were prescribed yesterday, and has been drinking alcohol. No Percocet pill bottle could be found. He has been a poor historian, today. 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  He was seen here yesterday by the, with an ankle injury that occurred while walking, and was felt to be secondary to his weakness related to the stroke. At that time he denied drinking alcohol, and expressed, no additional complaints. He was seen by me, yesterday.  Level V caveat- altered mental status   Past Medical History  Diagnosis Date  . Hypertension   . Alcoholism     pt is also bi polar  . Stroke 05/15/2010    pt states stroke in 2011   History reviewed. No pertinent past surgical history. No family history on file. History  Substance Use Topics  . Smoking status: Heavy Tobacco Smoker    Types: Cigarettes  . Smokeless tobacco: Not on file  . Alcohol Use: No    Review of Systems  Unable to perform ROS     Allergies  Aspirin  Home Medications   Prior to Admission medications   Medication Sig Start Date End Date Taking? Authorizing Provider  albuterol (PROVENTIL HFA;VENTOLIN HFA) 108 (90 BASE) MCG/ACT inhaler Inhale 1 puff into the lungs 2 (two) times daily as needed for wheezing or shortness of breath.   Yes Historical Provider, MD  lisinopril (PRINIVIL,ZESTRIL) 10 MG tablet Take 10 mg by mouth daily.   Yes Historical Provider, MD   oxyCODONE-acetaminophen (PERCOCET/ROXICET) 5-325 MG per tablet Take 2 tablets by mouth every 4 (four) hours as needed (pain.). 11/01/13  Yes Richarda Blade, MD  sucralfate (CARAFATE) 1 G tablet Take 1 g by mouth 4 (four) times daily.   Yes Historical Provider, MD  pindolol (VISKEN) 5 MG tablet Take 5 mg by mouth 2 (two) times daily.    Historical Provider, MD   BP 122/87  Pulse 102  Temp(Src) 98.2 F (36.8 C) (Oral)  Resp 18  SpO2 99% Physical Exam  Nursing note and vitals reviewed. Constitutional: He is oriented to person, place, and time. He appears well-developed and well-nourished. He appears distressed (He is sleepy).  HENT:  Head: Normocephalic and atraumatic.  Right Ear: External ear normal.  Left Ear: External ear normal.  Eyes: Conjunctivae and EOM are normal. Pupils are equal, round, and reactive to light.  Neck: Normal range of motion and phonation normal. Neck supple.  Cardiovascular: Normal rate, regular rhythm, normal heart sounds and intact distal pulses.   Pulmonary/Chest: Effort normal and breath sounds normal. He exhibits no bony tenderness.  Abdominal: Soft. There is no tenderness.  Musculoskeletal: Normal range of motion. He exhibits no edema and no tenderness.  Left ankle, mildly swollen laterally. He is not wearing the left ankle ASO splint that we gave him yesterday.  Neurological: He is alert and oriented to person, place, and time. No cranial nerve deficit or sensory deficit. He exhibits  normal muscle tone. Coordination normal.  Lethargic, answers simple questions with single words, then falls asleep  Skin: Skin is warm, dry and intact.  Psychiatric:  He is obtunded    ED Course  Procedures (including critical care time)  Medications  0.9 %  sodium chloride infusion ( Intravenous New Bag/Given 11/02/13 1247)  sodium chloride 0.9 % bolus 500 mL (500 mLs Intravenous New Bag/Given 11/02/13 1246)    Patient Vitals for the past 24 hrs:  BP Temp Temp src Pulse  Resp SpO2  11/02/13 1400 122/87 mmHg - - 102 18 99 %  11/02/13 1351 115/78 mmHg - - 117 17 97 %  11/02/13 1311 - - - - - 96 %  11/02/13 1300 119/84 mmHg - - 99 16 92 %  11/02/13 1223 106/80 mmHg 98.2 F (36.8 C) Oral 102 20 94 %  11/02/13 1219 - - - - - 90 %    2:38 PM Reevaluation with update and discussion. After initial assessment and treatment, an updated evaluation reveals he remains obtunded. Vital signs are reassuring. Timithy Arons L   Labs Review Labs Reviewed  COMPREHENSIVE METABOLIC PANEL - Abnormal; Notable for the following:    Calcium 8.2 (*)    AST 42 (*)    Alkaline Phosphatase 129 (*)    Total Bilirubin <0.2 (*)    GFR calc non Af Amer 86 (*)    All other components within normal limits  ETHANOL - Abnormal; Notable for the following:    Alcohol, Ethyl (B) 344 (*)    All other components within normal limits  SALICYLATE LEVEL - Abnormal; Notable for the following:    Salicylate Lvl <9.6 (*)    All other components within normal limits  CBC WITH DIFFERENTIAL  PROTIME-INR  TROPONIN I  URINALYSIS, ROUTINE W REFLEX MICROSCOPIC  URINE RAPID DRUG SCREEN (HOSP PERFORMED)  ACETAMINOPHEN LEVEL    Imaging Review Dg Ankle Complete Left  11/01/2013   CLINICAL DATA:  Pain post trauma  EXAM: LEFT ANKLE COMPLETE - 3+ VIEW  COMPARISON:  None.  FINDINGS: Frontal, oblique, and lateral views were obtained. There is evidence of old trauma in the lateral malleolar region with remodeling. There is evidence of old trauma in the distal tibiofibular syndesmosis with calcification. There is no acute fracture. There is soft tissue swelling with a joint effusion. There is moderate generalized osteoarthritic change. There is evidence of an old avulsion along the dorsal navicular with remodeling. There is a benign exostosis along the dorsal distal talus. The ankle mortise appears grossly intact. There is an inferior calcaneal spur.  IMPRESSION: Evidence of old trauma with remodeling. Extensive  osteoarthritic change. Soft tissue swelling with effusion raises question of ligamentous injury. No acute fracture appreciable on this study. The ankle mortise appears grossly intact.   Electronically Signed   By: Lowella Grip M.D.   On: 11/01/2013 12:15     EKG Interpretation None      MDM   Final diagnoses:  Alcohol intoxication, uncomplicated    Evaluation is consistent with altered mental status secondary to alcohol intoxication. His Tylenol level is negative, making it unlikely that he had ingested a significant amount of Percocet, which contains Tylenol. He will require observation, until he is sober. The patient lives alone  Nursing Notes Reviewed/ Care Coordinated, and agree without changes. Applicable Imaging Reviewed.  Interpretation of Laboratory Data incorporated into ED treatment  Plan: Reassess by oncoming provider team as he sobers    Richarda Blade, MD 11/02/13  1438 

## 2013-11-02 NOTE — ED Notes (Signed)
NT at bedside attempting lab draw.

## 2013-11-02 NOTE — ED Notes (Signed)
Per Poison Control pt case closed. Observed for drowsiness but not other concerns at present time per [place].

## 2013-11-02 NOTE — ED Notes (Signed)
Bed: UY37 Expected date:  Expected time:  Means of arrival:  Comments: "accidentally" took 20 percocet

## 2013-11-02 NOTE — Discharge Instructions (Signed)
Alcohol Intoxication °Alcohol intoxication occurs when you drink enough alcohol that it affects your ability to function. It can be mild or very severe. Drinking a lot of alcohol in a short time is called binge drinking. This can be very harmful. Drinking alcohol can also be more dangerous if you are taking medicines or other drugs. Some of the effects caused by alcohol may include: °· Loss of coordination. °· Changes in mood and behavior. °· Unclear thinking. °· Trouble talking (slurred speech). °· Throwing up (vomiting). °· Confusion. °· Slowed breathing. °· Twitching and shaking (seizures). °· Loss of consciousness. °HOME CARE °· Do not drive after drinking alcohol. °· Drink enough water and fluids to keep your pee (urine) clear or pale yellow. Avoid caffeine. °· Only take medicine as told by your doctor. °GET HELP IF: °· You throw up (vomit) many times. °· You do not feel better after a few days. °· You frequently have alcohol intoxication. Your doctor can help decide if you should see a substance use treatment counselor. °GET HELP RIGHT AWAY IF: °· You become shaky when you stop drinking. °· You have twitching and shaking. °· You throw up blood. It may look bright red or like coffee grounds. °· You notice blood in your poop (bowel movements). °· You become lightheaded or pass out (faint). °MAKE SURE YOU:  °· Understand these instructions. °· Will watch your condition. °· Will get help right away if you are not doing well or get worse. °Document Released: 09/02/2007 Document Revised: 11/16/2012 Document Reviewed: 08/19/2012 °[employer]® Patient Information ©2015 [employer]. This information is not intended to replace advice given to you by your health care provider. Make sure you discuss any questions you have with your health care provider. ° °

## 2013-11-02 NOTE — ED Notes (Signed)
Gave pt a [place] sandwhich and a coke

## 2013-11-03 ENCOUNTER — Emergency Department (HOSPITAL_COMMUNITY)
Admission: EM | Admit: 2013-11-03 | Discharge: 2013-11-04 | Disposition: A | Discharge status: Home / Self Care | Payer: Medicare Other | Attending: Emergency Medicine | Admitting: Emergency Medicine

## 2013-11-03 ENCOUNTER — Encounter (HOSPITAL_COMMUNITY): Payer: Self-pay | Admitting: Emergency Medicine

## 2013-11-03 DIAGNOSIS — R45850 Homicidal ideations: Secondary | ICD-10-CM | POA: Diagnosis not present

## 2013-11-03 DIAGNOSIS — F329 Major depressive disorder, single episode, unspecified: Secondary | ICD-10-CM | POA: Diagnosis not present

## 2013-11-03 DIAGNOSIS — R45851 Suicidal ideations: Secondary | ICD-10-CM | POA: Insufficient documentation

## 2013-11-03 DIAGNOSIS — F3289 Other specified depressive episodes: Secondary | ICD-10-CM | POA: Insufficient documentation

## 2013-11-03 DIAGNOSIS — F32A Depression, unspecified: Secondary | ICD-10-CM

## 2013-11-03 DIAGNOSIS — F1010 Alcohol abuse, uncomplicated: Secondary | ICD-10-CM | POA: Diagnosis not present

## 2013-11-03 DIAGNOSIS — Z8673 Personal history of transient ischemic attack (TIA), and cerebral infarction without residual deficits: Secondary | ICD-10-CM | POA: Insufficient documentation

## 2013-11-03 DIAGNOSIS — Z79899 Other long term (current) drug therapy: Secondary | ICD-10-CM | POA: Insufficient documentation

## 2013-11-03 DIAGNOSIS — I10 Essential (primary) hypertension: Secondary | ICD-10-CM | POA: Insufficient documentation

## 2013-11-03 LAB — RAPID URINE DRUG SCREEN, HOSP PERFORMED
AMPHETAMINES: NOT DETECTED
Barbiturates: NOT DETECTED
Benzodiazepines: NOT DETECTED
Cocaine: NOT DETECTED
OPIATES: NOT DETECTED
TETRAHYDROCANNABINOL: NOT DETECTED

## 2013-11-03 LAB — COMPREHENSIVE METABOLIC PANEL
ALT: 10 U/L (ref 0–53)
AST: 34 U/L (ref 0–37)
Albumin: 4.2 g/dL (ref 3.5–5.2)
Alkaline Phosphatase: 140 U/L — ABNORMAL HIGH (ref 39–117)
Anion gap: 18 — ABNORMAL HIGH (ref 5–15)
BUN: 10 mg/dL (ref 6–23)
CO2: 19 mEq/L (ref 19–32)
CREATININE: 0.87 mg/dL (ref 0.50–1.35)
Calcium: 8.5 mg/dL (ref 8.4–10.5)
Chloride: 107 mEq/L (ref 96–112)
GFR calc non Af Amer: >90 mL/min (ref >90)
Glucose, Bld: 93 mg/dL (ref 70–99)
Potassium: 4 mEq/L (ref 3.7–5.3)
Sodium: 144 mEq/L (ref 137–147)
Total Bilirubin: <0.2 mg/dL — ABNORMAL LOW (ref 0.3–1.2)
Total Protein: 8 g/dL (ref 6.0–8.3)

## 2013-11-03 LAB — CBC
HCT: 43.7 % (ref 39.0–52.0)
Hemoglobin: 14.9 g/dL (ref 13.0–17.0)
MCH: 29.6 pg (ref 26.0–34.0)
MCHC: 34.1 g/dL (ref 30.0–36.0)
MCV: 86.9 fL (ref 78.0–100.0)
Platelets: 289 10*3/uL (ref 150–400)
RBC: 5.03 MIL/uL (ref 4.22–5.81)
RDW: 15.2 % (ref 11.5–15.5)
WBC: 8.2 10*3/uL (ref 4.0–10.5)

## 2013-11-03 LAB — ACETAMINOPHEN LEVEL: Acetaminophen (Tylenol), Serum: <15 ug/mL (ref 10–30)

## 2013-11-03 LAB — ETHANOL: Alcohol, Ethyl (B): 234 mg/dL — ABNORMAL HIGH (ref 0–11)

## 2013-11-03 LAB — SALICYLATE LEVEL

## 2013-11-03 MED ORDER — SODIUM CHLORIDE 0.9 % IV BOLUS (SEPSIS)
1000.0000 mL | Freq: Once | INTRAVENOUS | Status: AC
  Administered 2013-11-03: 1000 mL via INTRAVENOUS

## 2013-11-03 MED ORDER — IBUPROFEN 200 MG PO TABS: 600.0000 mg | ORAL_TABLET | Freq: Three times a day (TID) | ORAL | Status: DC | PRN

## 2013-11-03 MED ORDER — LISINOPRIL 10 MG PO TABS
10.0000 mg | ORAL_TABLET | Freq: Every day | ORAL | Status: DC
  Administered 2013-11-04: 10 mg via ORAL
  Filled 2013-11-03: qty 1

## 2013-11-03 MED ORDER — SODIUM CHLORIDE 0.9 % IV SOLN: Freq: Once | INTRAVENOUS | Status: DC

## 2013-11-03 MED ORDER — LORAZEPAM 1 MG PO TABS
0.0000 mg | ORAL_TABLET | Freq: Four times a day (QID) | ORAL | Status: DC
  Administered 2013-11-04: 1 mg via ORAL
  Filled 2013-11-03: qty 1

## 2013-11-03 MED ORDER — THIAMINE HCL 100 MG/ML IJ SOLN
100.0000 mg | Freq: Every day | INTRAMUSCULAR | Status: DC
  Administered 2013-11-03: 100 mg via INTRAVENOUS
  Filled 2013-11-03: qty 2

## 2013-11-03 MED ORDER — LORAZEPAM 1 MG PO TABS: 0.0000 mg | ORAL_TABLET | Freq: Two times a day (BID) | ORAL | Status: DC

## 2013-11-03 MED ORDER — ALBUTEROL SULFATE HFA 108 (90 BASE) MCG/ACT IN AERS: 1.0000 | INHALATION_SPRAY | Freq: Two times a day (BID) | RESPIRATORY_TRACT | Status: DC | PRN

## 2013-11-03 MED ORDER — ONDANSETRON HCL 4 MG PO TABS: 4.0000 mg | ORAL_TABLET | Freq: Three times a day (TID) | ORAL | Status: DC | PRN

## 2013-11-03 MED ORDER — HALOPERIDOL LACTATE 5 MG/ML IJ SOLN: 5.0000 mg | Freq: Once | INTRAMUSCULAR | Status: AC | PRN

## 2013-11-03 MED ORDER — ACETAMINOPHEN 325 MG PO TABS
650.0000 mg | ORAL_TABLET | ORAL | Status: DC | PRN
  Administered 2013-11-04: 650 mg via ORAL
  Filled 2013-11-03 (×2): qty 2

## 2013-11-03 MED ORDER — ALUM & MAG HYDROXIDE-SIMETH 200-200-20 MG/5ML PO SUSP: 30.0000 mL | ORAL | Status: DC | PRN

## 2013-11-03 MED ORDER — VITAMIN B-1 100 MG PO TABS
100.0000 mg | ORAL_TABLET | Freq: Every day | ORAL | Status: DC
  Administered 2013-11-04: 100 mg via ORAL
  Filled 2013-11-03: qty 1

## 2013-11-03 MED ORDER — SUCRALFATE 1 G PO TABS
1.0000 g | ORAL_TABLET | Freq: Four times a day (QID) | ORAL | Status: DC
  Administered 2013-11-04 (×2): 1 g via ORAL
  Filled 2013-11-03 (×2): qty 1

## 2013-11-03 MED ORDER — PINDOLOL 5 MG PO TABS
5.0000 mg | ORAL_TABLET | Freq: Two times a day (BID) | ORAL | Status: DC
  Administered 2013-11-04 (×2): 5 mg via ORAL
  Filled 2013-11-03 (×3): qty 1

## 2013-11-03 MED ORDER — LORAZEPAM 2 MG/ML IJ SOLN
1.0000 mg | INTRAMUSCULAR | Status: DC | PRN
  Administered 2013-11-03: 1 mg via INTRAVENOUS
  Filled 2013-11-03: qty 1

## 2013-11-03 NOTE — ED Notes (Signed)
Crisis line, social work  [phone]

## 2013-11-03 NOTE — BH Assessment (Signed)
Assessment Note  [name] is an 48 y.o. male  presenting to [facility] ED after being IVC'd by his therapist. Pt reported that when he was intoxicated he made statements about harming himself and others but he no longer feels as if he will harm himself or anyone else. It has been documented that patient has a history of schizophrenia and has been prescribed numerous medications which he reports being compliant with. It has also been document that pt informed his therapist that he wanted to kill himself by cutting his wrist and he wanted to kill other people.  Pt is alert and oriented x3. Pt is calm and cooperative at this time. Pt denies SI at this time but stated "I said I wanted to kill myself when I was drunk". Pt reported that he has attempted to commit suicide in the past by cutting his wrist. Pt did not report any self-injurious behaviors at this time. Pt denies HI and AVH at this time but reported that he has a history of auditory hallucinations but his medications have helped to decrease the hallucinations. Pt did not report any depressive symptoms at this time and did not report any issues with his sleep or appetite. Pt reported that he has access to [employer] but denied owning any guns or rifles. PT did not report any upcoming court dates or pending criminal charges. Pt reported that he drinks alcohol once in a while. Pt reported that during the past two days he consumed a gallon of vodka. Pt did not report any illicit substance use or any withdrawal symptoms at this time. Pt reported that he completed a detox program in [place] and he was able to remain sober for 4 years. Pt also reported that he is currently receiving mental health services [facility]. Pt did not report a history of physical, sexual or emotional abuse. Pt reported that he has a good support system which includes his ACT Team and his girlfriend.   Axis I: Alcohol intoxication, with moderate or severe use.  Axis  II: Deferred Axis III:  Past Medical History  Diagnosis Date  . Hypertension   . Alcoholism     pt is also bi polar  . Stroke 05/15/2010    pt states stroke in 2011   Axis IV: other psychosocial or environmental problems Axis V: 21-30 behavior considerably influenced by delusions or hallucinations OR serious impairment in judgment, communication OR inability to function in almost all areas  Past Medical History:  Past Medical History  Diagnosis Date  . Hypertension   . Alcoholism     pt is also bi polar  . Stroke 05/15/2010    pt states stroke in 2011    History reviewed. No pertinent past surgical history.  Family History: History reviewed. No pertinent family history.  Social History:  reports that he has been smoking Cigarettes.  He has been smoking about 0.00 packs per day. He does not have any smokeless tobacco history on file. He reports that he does not drink alcohol or use illicit drugs.  Additional Social History:  Alcohol / Drug Use History of alcohol / drug use?: Yes Longest period of sobriety (when/how long): 4  years  Withdrawal Symptoms: Patient aware of relationship between substance abuse and physical/medical complications Substance #1 Name of Substance 1: Alcohol  1 - Age of First Use: 11 1 - Amount (size/oz): "40oz to 1 gallon" 1 - Frequency: "once in a while" 1 - Duration: ongoing  1 -  Last Use / Amount: 11-03-13 "1 gallon of vodka"   CIWA: CIWA-Ar BP: 138/112 mmHg Pulse Rate: 117 Nausea and Vomiting: no nausea and no vomiting Tactile Disturbances: none Tremor: no tremor Auditory Disturbances: not present Paroxysmal Sweats: no sweat visible Visual Disturbances: not present Anxiety: no anxiety, at ease Headache, Fullness in Head: none present Agitation: normal activity Orientation and Clouding of Sensorium: oriented and can do serial additions CIWA-Ar Total: 0 COWS:    Allergies:  Allergies  Allergen Reactions  . Aspirin Swelling    Home  Medications:  (Not in a hospital admission)  OB/GYN Status:  No LMP for male patient.  General Assessment Data Location of Assessment: [facility] ED Is this a Tele or Face-to-Face Assessment?: Face-to-Face Is this an Initial Assessment or a Re-assessment for this encounter?: Initial Assessment Living Arrangements: Alone Can pt return to current living arrangement?: Yes Admission Status: Involuntary Is patient capable of signing voluntary admission?: Yes Transfer from: Home Referral Source: Self/Family/Friend     [place] Living Arrangements: Alone Name of Psychiatrist: [facility] ([facility] ) Name of Therapist: [facility]  ([facility])  Education Status Is patient currently in school?: No Current Grade: NA Highest grade of school patient has completed: 12 Name of school: NA Contact person: NA  Risk to self with the past 6 months Suicidal Ideation: No-Not Currently/Within Last 6 Months (IVC stated pt wanted to kill himself by cutting his wrist) Suicidal Intent: No-Not Currently/Within Last 6 Months (Kill himself by cutting his wrist) Is patient at risk for suicide?: Yes Suicidal Plan?: Yes-Currently Present Specify Current Suicidal Plan: Cutting his wrist Access to Means: Yes Specify Access to Suicidal Means: PT reported that he has access to weapons What has been your use of drugs/alcohol within the last 12 months?: Alcohol use Previous Attempts/Gestures: Yes How many times?: 1 Other Self Harm Risks: No other self harm risk identified at this time.  Triggers for Past Attempts: Other (Comment) (Relationship problems ) Intentional Self Injurious Behavior: None Family Suicide History: No Recent stressful life event(s):  (No stressful events reported ) Persecutory voices/beliefs?: No Depression: No Substance abuse history and/or treatment for substance abuse?: Yes Suicide prevention information given to non-admitted patients: Not  applicable  Risk to Others within the past 6 months Homicidal Ideation: No-Not Currently/Within Last 6 Months Thoughts of Harm to Others: No-Not Currently Present/Within Last 6 Months (IVC stated that pt wanted to kill others. ) Current Homicidal Intent: No-Not Currently/Within Last 6 Months Current Homicidal Plan: No-Not Currently/Within Last 6 Months Access to Homicidal Means: Yes Describe Access to Homicidal Means: Pt has access to knives Identified Victim: No one identified at this time.  History of harm to others?: No Assessment of Violence: None Noted Violent Behavior Description: No violent behaviors reported Does patient have access to weapons?: Yes (Comment) (Pt has access to knives.) Criminal Charges Pending?: No Does patient have a court date: No  Psychosis Hallucinations: None noted Delusions: None noted  Mental Status Report Appear/Hygiene: In scrubs Eye Contact: Good Motor Activity: Freedom of movement Speech: Logical/coherent Level of Consciousness: Alert Mood: Pleasant Affect: Appropriate to circumstance Anxiety Level: Minimal Thought Processes: Coherent;Relevant Judgement: Partial Orientation: Appropriate for developmental age Obsessive Compulsive Thoughts/Behaviors: None  Cognitive Functioning Concentration: Normal Memory: Recent Intact;Remote Intact IQ: Average Insight: Fair Impulse Control: Fair Appetite: Good Weight Loss: 0 Weight Gain: 0 Sleep: No Change Total Hours of Sleep: 9 Vegetative Symptoms: None  ADLScreening [facility] Assessment Services) Patient's cognitive ability adequate to safely complete  daily activities?: Yes Patient able to express need for assistance with ADLs?: Yes Independently performs ADLs?: Yes (appropriate for developmental age)  Prior Inpatient Therapy Prior Inpatient Therapy: Yes Prior Therapy Dates: 2005 Prior Therapy Facilty/Provider(s): [place], [place] Reason for Treatment: Detox   Prior Outpatient Therapy Prior  Outpatient Therapy: Yes Prior Therapy Dates: 2014-present  Prior Therapy Facilty/Provider(s): [facility] Reason for Treatment: Schizophrenia  ADL Screening (condition at time of admission) Patient's cognitive ability adequate to safely complete daily activities?: Yes Is the patient deaf or have difficulty hearing?: No Does the patient have difficulty seeing, even when wearing glasses/contacts?: No Does the patient have difficulty concentrating, remembering, or making decisions?: No Patient able to express need for assistance with ADLs?: Yes Does the patient have difficulty dressing or bathing?: No Independently performs ADLs?: Yes (appropriate for developmental age) Does the patient have difficulty walking or climbing stairs?: No       Abuse/Neglect Assessment (Assessment to be complete while patient is alone) Physical Abuse: Denies Verbal Abuse: Denies Sexual Abuse: Denies Exploitation of patient/patient's resources: Denies Self-Neglect: Denies Values / Beliefs Cultural Requests During Hospitalization: None Spiritual Requests During Hospitalization: None        Additional Information 1:1 In Past 12 Months?: No CIRT Risk: No Elopement Risk: No     Disposition:  Disposition Initial Assessment Completed for this Encounter: Yes  On Site Evaluation by:   Reviewed with Physician:    Oleva Koo S 11/03/2013 10:00 PM

## 2013-11-03 NOTE — ED Provider Notes (Signed)
CSN: [id number]     Arrival date & time 11/03/13  1341 History   First MD Initiated Contact with Patient 11/03/13 1455     Chief Complaint  Patient presents with  . Suicidal, IVC      (Consider location/radiation/quality/duration/timing/severity/associated sxs/prior Treatment) HPI Comments: Pt presents with IVC for SI/HI. Pt reports drinking 2 gallons of vodka since last night. He states he has had worsening SI w/o plan this morning, has also had HI with thoughts about "taking people out" with the gun that he owns. He has been hearing voices telling him to hurt himself & people.   Patient is a 48 y.o. male presenting with mental health disorder. The history is provided by the patient and the police. No language interpreter was used.  Mental Health Problem Presenting symptoms: hallucinations, homicidal ideas, paranoid behavior and suicidal thoughts   Presenting symptoms: no agitation   Patient accompanied by:  Law enforcement Degree of incapacity (severity):  Severe Onset quality:  Unable to specify Timing:  Constant Progression:  Worsening Chronicity:  Recurrent Context: alcohol use   Treatment compliance:  Most of the time Relieved by:  Nothing Worsened by:  Nothing tried Ineffective treatments:  None tried Associated symptoms: anhedonia, irritability and poor judgment   Associated symptoms: no abdominal pain, no appetite change, no chest pain, no fatigue and no headaches   Risk factors: hx of mental illness and hx of suicide attempts     Past Medical History  Diagnosis Date  . Hypertension   . Alcoholism     pt is also bi polar  . Stroke 05/15/2010    pt states stroke in 2011   History reviewed. No pertinent past surgical history. History reviewed. No pertinent family history. History  Substance Use Topics  . Smoking status: Heavy Tobacco Smoker    Types: Cigarettes  . Smokeless tobacco: Not on file  . Alcohol Use: No    Review of Systems  Constitutional: Positive  for irritability. Negative for fever, activity change, appetite change and fatigue.  HENT: Negative for congestion, facial swelling, rhinorrhea and trouble swallowing.   Eyes: Negative for photophobia and pain.  Respiratory: Negative for cough, chest tightness and shortness of breath.   Cardiovascular: Negative for chest pain and leg swelling.  Gastrointestinal: Negative for nausea, vomiting, abdominal pain, diarrhea and constipation.  Endocrine: Negative for polydipsia and polyuria.  Genitourinary: Negative for dysuria, urgency, decreased urine volume and difficulty urinating.  Musculoskeletal: Negative for back pain and gait problem.  Skin: Negative for color change, rash and wound.  Allergic/Immunologic: Negative for immunocompromised state.  Neurological: Negative for dizziness, facial asymmetry, speech difficulty, weakness, numbness and headaches.  Psychiatric/Behavioral: Positive for suicidal ideas, homicidal ideas, hallucinations and paranoia. Negative for confusion, decreased concentration and agitation.      Allergies  Aspirin  Home Medications   Prior to Admission medications   Medication Sig Start Date End Date Taking? Authorizing Provider  albuterol (PROVENTIL HFA;VENTOLIN HFA) 108 (90 BASE) MCG/ACT inhaler Inhale 1 puff into the lungs 2 (two) times daily as needed for wheezing or shortness of breath.   Yes Historical Provider, MD  lisinopril (PRINIVIL,ZESTRIL) 10 MG tablet Take 10 mg by mouth daily.   Yes Historical Provider, MD  oxyCODONE-acetaminophen (PERCOCET/ROXICET) 5-325 MG per tablet Take 2 tablets by mouth every 4 (four) hours as needed (pain.). 11/01/13  Yes Richarda Blade, MD  pindolol (VISKEN) 5 MG tablet Take 5 mg by mouth 2 (two) times daily.   Yes Historical Provider, MD  sucralfate (CARAFATE) 1 G tablet Take 1 g by mouth 4 (four) times daily.   Yes Historical Provider, MD   BP 138/112  Pulse 117  Temp(Src) 98.2 F (36.8 C) (Oral)  Resp 18  SpO2  93% Physical Exam  Constitutional: He is oriented to person, place, and time. He appears well-developed and well-nourished. No distress.  HENT:  Head: Normocephalic and atraumatic.  Mouth/Throat: No oropharyngeal exudate.  Eyes: Pupils are equal, round, and reactive to light.  Neck: Normal range of motion. Neck supple.  Cardiovascular: Normal rate, regular rhythm and normal heart sounds.  Exam reveals no gallop and no friction rub.   No murmur heard. Pulmonary/Chest: Breath sounds normal. Tachypnea noted. No respiratory distress. He has no wheezes. He has no rales.  Abdominal: Soft. Bowel sounds are normal. He exhibits no distension and no mass. There is no tenderness. There is no rebound and no guarding.  Musculoskeletal: Normal range of motion. He exhibits no edema and no tenderness.  Neurological: He is alert and oriented to person, place, and time.  Skin: Skin is warm and dry.  Psychiatric: He has a normal mood and affect.    ED Course  Procedures (including critical care time) Labs Review Labs Reviewed  COMPREHENSIVE METABOLIC PANEL - Abnormal; Notable for the following:    Alkaline Phosphatase 140 (*)    Total Bilirubin <0.2 (*)    Anion gap 18 (*)    All other components within normal limits  ETHANOL - Abnormal; Notable for the following:    Alcohol, Ethyl (B) 234 (*)    All other components within normal limits  SALICYLATE LEVEL - Abnormal; Notable for the following:    Salicylate Lvl <3.3 (*)    All other components within normal limits  ACETAMINOPHEN LEVEL  CBC  URINE RAPID DRUG SCREEN (HOSP PERFORMED)    Imaging Review No results found.   EKG Interpretation None      MDM   Final diagnoses:  Depression  Suicidal ideation  Homicidal ideation  Alcohol abuse    Pt is a 48 y.o. male with Pmhx as above who presents with IVC for SI/HI. Pt reports drinking 2 gallons of vodka since last night. He states he has had worsening SI w/o plan this morning, has also  had HI with thoughts about "taking people out" with the gun that he owns. He has been hearing voices telling him to hurt himself & people. His sleep has been poor. He denies recent illness. He is tachycardic on initial exam, but reports not eating or drinking much but vodka recently. ETOH elevated a 234. He is not withdrawing. He denies hx of Sx or DTs. No acute CBC, CMP findings. Ativan and IVF given. TTS consulted for admission for inpt treatment.    Ernestina Patches, MD 11/03/13 [phone]

## 2013-11-03 NOTE — ED Notes (Signed)
Bed: [facility] Expected date:  Expected time:  Means of arrival:  Comments: 

## 2013-11-03 NOTE — ED Notes (Addendum)
Per [employer], pt was seen in ED for ETOH intoxication and taking pain meds, was discharged, and today social worker went to visit pt. Pt reported he is SI/HI, plan to cut his wrists with kitchen knife if he is left alone. Hx of cutting wrists.. Pt reports he is a [employer].   Upon rn assessment, pt reports he was here yesterday, and that today he just started losing his mind. Reports ETOH and that he has taken percocet that were prescribed to him here. Reports SI/HI, and hearing voices telling him to kill himself and everyone else. Knee pain 10/10  Crisis line reports pt has drank 2 gallons of vodka in last 24 hours.  Crisis line [phone]

## 2013-11-03 NOTE — ED Notes (Signed)
[employer] at the bedside 

## 2013-11-03 NOTE — ED Notes (Signed)
Patient has one bag of belongings in locker 26.

## 2013-11-03 NOTE — BH Assessment (Signed)
Assessment completed. Consulted with Serena Colonel, NP who recommended that patient have a psychiatric evaluation in the morning. Dr. Tawnya Crook has been notified of the recommendations.

## 2013-11-04 ENCOUNTER — Encounter (HOSPITAL_COMMUNITY): Payer: Self-pay | Admitting: Psychiatry

## 2013-11-04 DIAGNOSIS — F200 Paranoid schizophrenia: Secondary | ICD-10-CM

## 2013-11-04 DIAGNOSIS — R45851 Suicidal ideations: Secondary | ICD-10-CM | POA: Diagnosis not present

## 2013-11-04 DIAGNOSIS — F1010 Alcohol abuse, uncomplicated: Secondary | ICD-10-CM

## 2013-11-04 NOTE — Consult Note (Signed)
[facility] Face-to-Face Psychiatry Consult   Reason for Consult:  Alcohol abuse Referring Physician:  EDP  [name] is an 48 y.o. male. Total Time spent with patient: 20 minutes  Assessment: AXIS I:  Alcohol Abuse and Chronic Paranoid Schizophrenia AXIS II:  Deferred AXIS III:   Past Medical History  Diagnosis Date  . Hypertension   . Alcoholism     pt is also bi polar  . Stroke 05/15/2010    pt states stroke in 2011   AXIS IV:  problems related to social environment AXIS V:  61-70 mild symptoms  Plan:  No evidence of imminent risk to self or others at present.  Dr. Adele Schilder assessed the patient and concurs with the plan.  Subjective:   [name] is a 48 y.o. male patient does not warrant admission.  HPI: Patient denies suicidal/homicidal ideations.  Baseline for auditory hallucinations.  He stated that he had a "slip back" with the alcohol and when his social worker visited, she called the police to bring him here.  No signs or symptoms of withdrawal symptoms, no past seizures.  Lives with his supportive girlfriend and requests to go home and follow-up with [facility], his regular provider.  Jovial on assessment with good insight. HPI Elements:   Location:  generalized. Quality:  acute. Severity:  mild. Timing:  intermittent. Duration:  brief. Context:  'slip back'.  Past Psychiatric History: Past Medical History  Diagnosis Date  . Hypertension   . Alcoholism     pt is also bi polar  . Stroke 05/15/2010    pt states stroke in 2011    reports that he has been smoking Cigarettes.  He has been smoking about 0.00 packs per day. He does not have any smokeless tobacco history on file. He reports that he does not drink alcohol or use illicit drugs. History reviewed. No pertinent family history. Family History Substance Abuse: No Family Supports: Yes, List: (Girlfriend/ACT Team ) Living Arrangements: Alone Can pt return to current living arrangement?: Yes Abuse/Neglect  [facility]) Physical Abuse: Denies Verbal Abuse: Denies Sexual Abuse: Denies Allergies:   Allergies  Allergen Reactions  . Aspirin Swelling    ACT Assessment Complete:  Yes:    Educational Status    Risk to Self: Risk to self with the past 6 months Suicidal Ideation: No-Not Currently/Within Last 6 Months (IVC stated pt wanted to kill himself by cutting his wrist) Suicidal Intent: No-Not Currently/Within Last 6 Months (Kill himself by cutting his wrist) Is patient at risk for suicide?: Yes Suicidal Plan?: Yes-Currently Present Specify Current Suicidal Plan: Cutting his wrist Access to Means: Yes Specify Access to Suicidal Means: PT reported that he has access to weapons What has been your use of drugs/alcohol within the last 12 months?: Alcohol use Previous Attempts/Gestures: Yes How many times?: 1 Other Self Harm Risks: No other self harm risk identified at this time.  Triggers for Past Attempts: Other (Comment) (Relationship problems ) Intentional Self Injurious Behavior: None Family Suicide History: No Recent stressful life event(s):  (No stressful events reported ) Persecutory voices/beliefs?: No Depression: No Substance abuse history and/or treatment for substance abuse?: Yes Suicide prevention information given to non-admitted patients: Not applicable  Risk to Others: Risk to Others within the past 6 months Homicidal Ideation: No-Not Currently/Within Last 6 Months Thoughts of Harm to Others: No-Not Currently Present/Within Last 6 Months (IVC stated that pt wanted to kill others. ) Current Homicidal Intent: No-Not Currently/Within Last 6 Months Current Homicidal Plan:  No-Not Currently/Within Last 6 Months Access to Homicidal Means: Yes Describe Access to Homicidal Means: Pt has access to knives Identified Victim: No one identified at this time.  History of harm to others?: No Assessment of Violence: None Noted Violent Behavior Description: No violent behaviors reported Does  patient have access to weapons?: Yes (Comment) (Pt has access to knives.) Criminal Charges Pending?: No Does patient have a court date: No  Abuse: Abuse/Neglect Assessment (Assessment to be complete while patient is alone) Physical Abuse: Denies Verbal Abuse: Denies Sexual Abuse: Denies Exploitation of patient/patient's resources: Denies Self-Neglect: Denies  Prior Inpatient Therapy: Prior Inpatient Therapy Prior Inpatient Therapy: Yes Prior Therapy Dates: 2005 Prior Therapy Facilty/Provider(s): [place], [place] Reason for Treatment: Detox   Prior Outpatient Therapy: Prior Outpatient Therapy Prior Outpatient Therapy: Yes Prior Therapy Dates: 2014-present  [facility]ilty[facility]Provider[facility] Reason for Treatment: Schizophrenia  Additional Information: Additional Information 1:1 In Past 12 Months?: No CIRT Risk: No Elopement Risk: No                  Objective: Blood pressure 103/63, pulse 89, temperature 97.9 F (36.6 C), temperature source Oral, resp. rate 18, SpO2 96.00%.There is no weight on file to calculate BMI. Results for orders placed during the hospital encounter of 11/03/13 (from the past 72 hour(s))  URINE RAPID DRUG SCREEN (HOSP PERFORMED)     Status: None   Collection Time    11/03/13  2:06 PM      Result Value Ref Range   Opiates NONE DETECTED  NONE DETECTED   Cocaine NONE DETECTED  NONE DETECTED   Benzodiazepines NONE DETECTED  NONE DETECTED   Amphetamines NONE DETECTED  NONE DETECTED   Tetrahydrocannabinol NONE DETECTED  NONE DETECTED   Barbiturates NONE DETECTED  NONE DETECTED   Comment:            DRUG SCREEN FOR MEDICAL PURPOSES     ONLY.  IF CONFIRMATION IS NEEDED     FOR ANY PURPOSE, NOTIFY LAB     WITHIN 5 DAYS.                LOWEST DETECTABLE LIMITS     FOR URINE DRUG SCREEN     Drug Class       Cutoff (ng/mL)     Amphetamine      1000     Barbiturate      200     Benzodiazepine   200     Tricyclics        300     Opiates          300     Cocaine          300     THC              50  ACETAMINOPHEN LEVEL     Status: None   Collection Time    11/03/13  2:08 PM      Result Value Ref Range   Acetaminophen (Tylenol), Serum <15.0  10 - 30 ug/mL   Comment:            THERAPEUTIC CONCENTRATIONS VARY     SIGNIFICANTLY. A RANGE OF 10-30     ug/mL MAY BE AN EFFECTIVE     CONCENTRATION FOR MANY PATIENTS.     HOWEVER, SOME ARE BEST TREATED     AT CONCENTRATIONS OUTSIDE THIS     RANGE.     ACETAMINOPHEN CONCENTRATIONS     >150  ug/mL AT 4 HOURS AFTER     INGESTION AND >50 ug/mL AT 12     HOURS AFTER INGESTION ARE     OFTEN ASSOCIATED WITH TOXIC     REACTIONS.  CBC     Status: None   Collection Time    11/03/13  2:08 PM      Result Value Ref Range   WBC 8.2  4.0 - 10.5 K/uL   RBC 5.03  4.22 - 5.81 MIL/uL   Hemoglobin 14.9  13.0 - 17.0 g/dL   HCT 43.7  39.0 - 52.0 %   MCV 86.9  78.0 - 100.0 fL   MCH 29.6  26.0 - 34.0 pg   MCHC 34.1  30.0 - 36.0 g/dL   RDW 15.2  11.5 - 15.5 %   Platelets 289  150 - 400 K/uL  COMPREHENSIVE METABOLIC PANEL     Status: Abnormal   Collection Time    11/03/13  2:08 PM      Result Value Ref Range   Sodium 144  137 - 147 mEq/L   Potassium 4.0  3.7 - 5.3 mEq/L   Chloride 107  96 - 112 mEq/L   CO2 19  19 - 32 mEq/L   Glucose, Bld 93  70 - 99 mg/dL   BUN 10  6 - 23 mg/dL   Creatinine, Ser 0.87  0.50 - 1.35 mg/dL   Calcium 8.5  8.4 - 10.5 mg/dL   Total Protein 8.0  6.0 - 8.3 g/dL   Albumin 4.2  3.5 - 5.2 g/dL   AST 34  0 - 37 U/L   ALT 10  0 - 53 U/L   Alkaline Phosphatase 140 (*) 39 - 117 U/L   Total Bilirubin <0.2 (*) 0.3 - 1.2 mg/dL   GFR calc non Af Amer >90  >90 mL/min   GFR calc Af Amer >90  >90 mL/min   Comment: (NOTE)     The eGFR has been calculated using the CKD EPI equation.     This calculation has not been validated in all clinical situations.     eGFR's persistently <90 mL/min signify possible Chronic Kidney     Disease.   Anion gap 18 (*)  5 - 15  ETHANOL     Status: Abnormal   Collection Time    11/03/13  2:08 PM      Result Value Ref Range   Alcohol, Ethyl (B) 234 (*) 0 - 11 mg/dL   Comment:            LOWEST DETECTABLE LIMIT FOR     SERUM ALCOHOL IS 11 mg/dL     FOR MEDICAL PURPOSES ONLY  SALICYLATE LEVEL     Status: Abnormal   Collection Time    11/03/13  2:08 PM      Result Value Ref Range   Salicylate Lvl <7.8 (*) 2.8 - 20.0 mg/dL   Labs are reviewed and are pertinent for no medical issues noted.  Current Facility-Administered Medications  Medication Dose Route Frequency Provider Last Rate Last Dose  . 0.9 %  sodium chloride infusion   Intravenous Once Tanna Furry, MD      . acetaminophen (TYLENOL) tablet 650 mg  650 mg Oral Q4H PRN Ernestina Patches, MD   650 mg at 11/04/13 0111  . albuterol (PROVENTIL HFA;VENTOLIN HFA) 108 (90 BASE) MCG/ACT inhaler 1 puff  1 puff Inhalation BID PRN Ernestina Patches, MD      . alum & mag hydroxide-simeth (  MAALOX/MYLANTA) 200-200-20 MG/5ML suspension 30 mL  30 mL Oral PRN Ernestina Patches, MD      . lisinopril (PRINIVIL,ZESTRIL) tablet 10 mg  10 mg Oral Daily Ernestina Patches, MD   10 mg at 11/04/13 1000  . LORazepam (ATIVAN) injection 1 mg  1 mg Intravenous Q30 min PRN Tanna Furry, MD   1 mg at 11/03/13 1537  . LORazepam (ATIVAN) tablet 0-4 mg  0-4 mg Oral 4 times per day Ernestina Patches, MD   1 mg at 11/04/13 1217   Followed by  . [START ON 11/05/2013] LORazepam (ATIVAN) tablet 0-4 mg  0-4 mg Oral Q12H Ernestina Patches, MD      . ondansetron (ZOFRAN) tablet 4 mg  4 mg Oral Q8H PRN Ernestina Patches, MD      . pindolol (VISKEN) tablet 5 mg  5 mg Oral BID Ernestina Patches, MD   5 mg at 11/04/13 1000  . sucralfate (CARAFATE) tablet 1 g  1 g Oral QID Ernestina Patches, MD   1 g at 11/04/13 1100  . thiamine (VITAMIN B-1) tablet 100 mg  100 mg Oral Daily Ernestina Patches, MD   100 mg at 11/04/13 1000   Or  . thiamine (B-1) injection 100 mg  100 mg Intravenous Daily Ernestina Patches, MD   100 mg at 11/03/13  1628   Current Outpatient Prescriptions  Medication Sig Dispense Refill  . albuterol (PROVENTIL HFA;VENTOLIN HFA) 108 (90 BASE) MCG/ACT inhaler Inhale 1 puff into the lungs 2 (two) times daily as needed for wheezing or shortness of breath.      . lisinopril (PRINIVIL,ZESTRIL) 10 MG tablet Take 10 mg by mouth daily.      Marland Kitchen oxyCODONE-acetaminophen (PERCOCET/ROXICET) 5-325 MG per tablet Take 2 tablets by mouth every 4 (four) hours as needed (pain.).      Marland Kitchen pindolol (VISKEN) 5 MG tablet Take 5 mg by mouth 2 (two) times daily.      . sucralfate (CARAFATE) 1 G tablet Take 1 g by mouth 4 (four) times daily.        Psychiatric Specialty Exam:     Blood pressure 103/63, pulse 89, temperature 97.9 F (36.6 C), temperature source Oral, resp. rate 18, SpO2 96.00%.There is no weight on file to calculate BMI.  General Appearance: Casual  Eye Contact::  Good  Speech:  Normal Rate  Volume:  Normal  Mood:  Euthymic  Affect:  Congruent  Thought Process:  Coherent  Orientation:  Full (Time, Place, and Person)  Thought Content:  Hallucinations: Auditory  Suicidal Thoughts:  No  Homicidal Thoughts:  No  Memory:  Immediate;   Good Recent;   Good Remote;   Good  Judgement:  Fair  Insight:  Good  Psychomotor Activity:  Normal  Concentration:  Good  Recall:  Good  Fund of Knowledge:Good  Language: Good  Akathisia:  No  Handed:  Right  AIMS (if indicated):     Assets:  Communication Skills Desire for Improvement Financial Resources/Insurance Housing Intimacy Leisure Time Physical Health Resilience Social Support Transportation  Sleep:      Musculoskeletal: Strength & Muscle Tone: within normal limits Gait & Station: normal Patient leans: N/A  Treatment Plan Summary: Discharge home with follow-up with his regular provider, [facility].  Waylan Boga, [place] 11/04/2013 12:24 PM  I have personally seen the patient and agreed with the findings and involved in the treatment plan. Berniece Andreas, MD

## 2013-11-04 NOTE — [facility] Suicide Risk Assessment (Signed)
Suicide Risk Assessment  Discharge Assessment     Demographic Factors:  Male  Total Time spent with patient: 20 minutes  Psychiatric Specialty Exam:     Blood pressure 103/63, pulse 89, temperature 97.9 F (36.6 C), temperature source Oral, resp. rate 18, SpO2 96.00%.There is no weight on file to calculate BMI.  General Appearance: Casual  Eye Contact::  Good  Speech:  Normal Rate  Volume:  Normal  Mood:  Euthymic  Affect:  Congruent  Thought Process:  Coherent  Orientation:  Full (Time, Place, and Person)  Thought Content:  Hallucinations: Auditory  Suicidal Thoughts:  No  Homicidal Thoughts:  No  Memory:  Immediate;   Good Recent;   Good Remote;   Good  Judgement:  Fair  Insight:  Good  Psychomotor Activity:  Normal  Concentration:  Good  Recall:  Good  Fund of Knowledge:Good  Language: Good  Akathisia:  No  Handed:  Right  AIMS (if indicated):     Assets:  Communication Skills Desire for Improvement Financial Resources/Insurance Housing Intimacy Leisure Time Physical Health Resilience Social Support Transportation  Sleep:      Musculoskeletal: Strength & Muscle Tone: within normal limits Gait & Station: normal Patient leans: N/A  Mental Status Per Nursing Assessment::   On Admission:   alcohol abuse  Current Mental Status by Physician: NA  Loss Factors: NA  Historical Factors: NA  Risk Reduction Factors:   Living with another person, especially a relative, Positive social support, Positive therapeutic relationship and Positive coping skills or problem solving skills  Continued Clinical Symptoms:  Substance abuse  Cognitive Features That Contribute To Risk:  None    Suicide Risk:  Minimal: No identifiable suicidal ideation.  Patients presenting with no risk factors but with morbid ruminations; may be classified as minimal risk based on the severity of the depressive symptoms  Discharge Diagnoses:   AXIS I:  Alcohol Abuse and Chronic  Paranoid Schizophrenia AXIS II:  Deferred AXIS III:   Past Medical History  Diagnosis Date  . Hypertension   . Alcoholism     pt is also bi polar  . Stroke 05/15/2010    pt states stroke in 2011   AXIS IV:  problems related to social environment AXIS V:  61-70 mild symptoms  Plan Of Care/Follow-up recommendations:  Activity:  as tolerated Diet:  low-sodium heart healthy diet  Is patient on multiple antipsychotic therapies at discharge:  No   Has Patient had three or more failed trials of antipsychotic monotherapy by history:  No  Recommended Plan for Multiple Antipsychotic Therapies: NA    LORD, [place], PMH-NP 11/04/2013, 12:30 PM

## 2013-11-15 DIAGNOSIS — F200 Paranoid schizophrenia: Secondary | ICD-10-CM | POA: Diagnosis not present

## 2013-12-04 ENCOUNTER — Encounter (HOSPITAL_COMMUNITY): Payer: Self-pay | Admitting: Emergency Medicine

## 2013-12-04 ENCOUNTER — Inpatient Hospital Stay (HOSPITAL_COMMUNITY)
Admission: EM | Admit: 2013-12-04 | Discharge: 2013-12-07 | DRG: 392 | Disposition: A | Discharge status: Home / Self Care | Payer: Medicare Other | Attending: Internal Medicine | Admitting: Internal Medicine

## 2013-12-04 DIAGNOSIS — K2980 Duodenitis without bleeding: Secondary | ICD-10-CM | POA: Diagnosis present

## 2013-12-04 DIAGNOSIS — K449 Diaphragmatic hernia without obstruction or gangrene: Secondary | ICD-10-CM | POA: Diagnosis present

## 2013-12-04 DIAGNOSIS — I10 Essential (primary) hypertension: Secondary | ICD-10-CM | POA: Diagnosis not present

## 2013-12-04 DIAGNOSIS — E872 Acidosis, unspecified: Secondary | ICD-10-CM | POA: Diagnosis present

## 2013-12-04 DIAGNOSIS — F10929 Alcohol use, unspecified with intoxication, unspecified: Secondary | ICD-10-CM

## 2013-12-04 DIAGNOSIS — K209 Esophagitis, unspecified without bleeding: Secondary | ICD-10-CM | POA: Diagnosis not present

## 2013-12-04 DIAGNOSIS — Z8673 Personal history of transient ischemic attack (TIA), and cerebral infarction without residual deficits: Secondary | ICD-10-CM | POA: Diagnosis not present

## 2013-12-04 DIAGNOSIS — R110 Nausea: Secondary | ICD-10-CM

## 2013-12-04 DIAGNOSIS — F17200 Nicotine dependence, unspecified, uncomplicated: Secondary | ICD-10-CM | POA: Diagnosis present

## 2013-12-04 DIAGNOSIS — R109 Unspecified abdominal pain: Secondary | ICD-10-CM | POA: Diagnosis not present

## 2013-12-04 DIAGNOSIS — R1013 Epigastric pain: Secondary | ICD-10-CM | POA: Diagnosis present

## 2013-12-04 DIAGNOSIS — E669 Obesity, unspecified: Secondary | ICD-10-CM | POA: Diagnosis present

## 2013-12-04 DIAGNOSIS — Z720 Tobacco use: Secondary | ICD-10-CM | POA: Diagnosis present

## 2013-12-04 DIAGNOSIS — Z6829 Body mass index (BMI) 29.0-29.9, adult: Secondary | ICD-10-CM | POA: Diagnosis not present

## 2013-12-04 DIAGNOSIS — Z79899 Other long term (current) drug therapy: Secondary | ICD-10-CM

## 2013-12-04 DIAGNOSIS — R42 Dizziness and giddiness: Secondary | ICD-10-CM | POA: Diagnosis not present

## 2013-12-04 DIAGNOSIS — F1010 Alcohol abuse, uncomplicated: Secondary | ICD-10-CM | POA: Diagnosis present

## 2013-12-04 DIAGNOSIS — K922 Gastrointestinal hemorrhage, unspecified: Secondary | ICD-10-CM

## 2013-12-04 DIAGNOSIS — D72829 Elevated white blood cell count, unspecified: Secondary | ICD-10-CM

## 2013-12-04 DIAGNOSIS — R112 Nausea with vomiting, unspecified: Secondary | ICD-10-CM | POA: Diagnosis not present

## 2013-12-04 DIAGNOSIS — K920 Hematemesis: Secondary | ICD-10-CM | POA: Diagnosis not present

## 2013-12-04 DIAGNOSIS — R404 Transient alteration of awareness: Secondary | ICD-10-CM | POA: Diagnosis not present

## 2013-12-04 DIAGNOSIS — K2100 Gastro-esophageal reflux disease with esophagitis, without bleeding: Secondary | ICD-10-CM | POA: Diagnosis not present

## 2013-12-04 LAB — CBC WITH DIFFERENTIAL/PLATELET
BASOS PCT: 0 % (ref 0–1)
Basophils Absolute: 0 10*3/uL (ref 0.0–0.1)
Basophils Absolute: 0 10*3/uL (ref 0.0–0.1)
Basophils Relative: 0 % (ref 0–1)
EOS PCT: 0 % (ref 0–5)
EOS PCT: 0 % (ref 0–5)
Eosinophils Absolute: 0 10*3/uL (ref 0.0–0.7)
Eosinophils Absolute: 0 10*3/uL (ref 0.0–0.7)
HCT: 37.7 % — ABNORMAL LOW (ref 39.0–52.0)
HEMATOCRIT: 40.3 % (ref 39.0–52.0)
HEMOGLOBIN: 13 g/dL (ref 13.0–17.0)
Hemoglobin: 14 g/dL (ref 13.0–17.0)
LYMPHS PCT: 19 % (ref 12–46)
Lymphocytes Relative: 13 % (ref 12–46)
Lymphs Abs: 2.1 10*3/uL (ref 0.7–4.0)
Lymphs Abs: 2.8 10*3/uL (ref 0.7–4.0)
MCH: 29.5 pg (ref 26.0–34.0)
MCH: 29.3 pg (ref 26.0–34.0)
MCHC: 34.7 g/dL (ref 30.0–36.0)
MCHC: 34.5 g/dL (ref 30.0–36.0)
MCV: 85 fL (ref 78.0–100.0)
MCV: 84.9 fL (ref 78.0–100.0)
MONO ABS: 0.6 10*3/uL (ref 0.1–1.0)
MONO ABS: 0.6 10*3/uL (ref 0.1–1.0)
MONOS PCT: 4 % (ref 3–12)
Monocytes Relative: 4 % (ref 3–12)
Neutro Abs: 13.4 10*3/uL — ABNORMAL HIGH (ref 1.7–7.7)
Neutro Abs: 10.7 10*3/uL — ABNORMAL HIGH (ref 1.7–7.7)
Neutrophils Relative %: 83 % — ABNORMAL HIGH (ref 43–77)
Neutrophils Relative %: 77 % (ref 43–77)
Platelets: 299 10*3/uL (ref 150–400)
Platelets: 275 10*3/uL (ref 150–400)
RBC: 4.74 MIL/uL (ref 4.22–5.81)
RBC: 4.44 MIL/uL (ref 4.22–5.81)
RDW: 14.7 % (ref 11.5–15.5)
RDW: 14.8 % (ref 11.5–15.5)
WBC: 16.2 10*3/uL — ABNORMAL HIGH (ref 4.0–10.5)
WBC: 14.2 10*3/uL — AB (ref 4.0–10.5)

## 2013-12-04 LAB — URINALYSIS, ROUTINE W REFLEX MICROSCOPIC
Bilirubin Urine: NEGATIVE
Glucose, UA: NEGATIVE mg/dL
Hgb urine dipstick: NEGATIVE
Ketones, ur: 15 mg/dL — AB
LEUKOCYTES UA: NEGATIVE
Nitrite: NEGATIVE
Protein, ur: NEGATIVE mg/dL
Specific Gravity, Urine: 1.008 (ref 1.005–1.030)
Urobilinogen, UA: 0.2 mg/dL (ref 0.0–1.0)
pH: 6.5 (ref 5.0–8.0)

## 2013-12-04 LAB — COMPREHENSIVE METABOLIC PANEL
ALBUMIN: 3.6 g/dL (ref 3.5–5.2)
ALT: 7 U/L (ref 0–53)
ALT: 6 U/L (ref 0–53)
ANION GAP: 20 — AB (ref 5–15)
AST: 24 U/L (ref 0–37)
AST: 24 U/L (ref 0–37)
Albumin: 3.4 g/dL — ABNORMAL LOW (ref 3.5–5.2)
Alkaline Phosphatase: 132 U/L — ABNORMAL HIGH (ref 39–117)
Alkaline Phosphatase: 123 U/L — ABNORMAL HIGH (ref 39–117)
Anion gap: 22 — ABNORMAL HIGH (ref 5–15)
BILIRUBIN TOTAL: 0.2 mg/dL — AB (ref 0.3–1.2)
BUN: 6 mg/dL (ref 6–23)
BUN: 6 mg/dL (ref 6–23)
CALCIUM: 8.3 mg/dL — AB (ref 8.4–10.5)
CHLORIDE: 95 meq/L — AB (ref 96–112)
CO2: 19 meq/L (ref 19–32)
CO2: 19 mEq/L (ref 19–32)
CREATININE: 0.69 mg/dL (ref 0.50–1.35)
Calcium: 8.2 mg/dL — ABNORMAL LOW (ref 8.4–10.5)
Chloride: 98 mEq/L (ref 96–112)
Creatinine, Ser: 0.71 mg/dL (ref 0.50–1.35)
GFR calc Af Amer: >90 mL/min (ref >90)
GFR calc Af Amer: >90 mL/min (ref >90)
GFR calc non Af Amer: >90 mL/min (ref >90)
Glucose, Bld: 89 mg/dL (ref 70–99)
Glucose, Bld: 81 mg/dL (ref 70–99)
Potassium: 3.9 mEq/L (ref 3.7–5.3)
Potassium: 4.2 mEq/L (ref 3.7–5.3)
Sodium: 136 mEq/L — ABNORMAL LOW (ref 137–147)
Sodium: 137 mEq/L (ref 137–147)
TOTAL PROTEIN: 6.9 g/dL (ref 6.0–8.3)
Total Bilirubin: 0.3 mg/dL (ref 0.3–1.2)
Total Protein: 7.5 g/dL (ref 6.0–8.3)

## 2013-12-04 LAB — OCCULT BLOOD GASTRIC / DUODENUM (SPECIMEN CUP)
Occult Blood, Gastric: NEGATIVE
pH, Gastric: 2

## 2013-12-04 LAB — SALICYLATE LEVEL: Salicylate Lvl: <2 mg/dL — ABNORMAL LOW (ref 2.8–20.0)

## 2013-12-04 LAB — PROTIME-INR
INR: 0.98 (ref 0.00–1.49)
INR: 1.01 (ref 0.00–1.49)
Prothrombin Time: 13 seconds (ref 11.6–15.2)
Prothrombin Time: 13.3 seconds (ref 11.6–15.2)

## 2013-12-04 LAB — I-STAT CG4 LACTIC ACID, ED: Lactic Acid, Venous: 3.53 mmol/L — ABNORMAL HIGH (ref 0.5–2.2)

## 2013-12-04 LAB — RAPID URINE DRUG SCREEN, HOSP PERFORMED
AMPHETAMINES: NOT DETECTED
Barbiturates: NOT DETECTED
Benzodiazepines: NOT DETECTED
COCAINE: NOT DETECTED
OPIATES: NOT DETECTED
Tetrahydrocannabinol: NOT DETECTED

## 2013-12-04 LAB — LIPASE, BLOOD: LIPASE: 7 U/L — AB (ref 11–59)

## 2013-12-04 LAB — MAGNESIUM: Magnesium: 1.9 mg/dL (ref 1.5–2.5)

## 2013-12-04 LAB — TYPE AND SCREEN
ABO/RH(D): O POS
Antibody Screen: NEGATIVE

## 2013-12-04 LAB — MRSA PCR SCREENING: MRSA by PCR: NEGATIVE

## 2013-12-04 LAB — APTT: aPTT: 29 seconds (ref 24–37)

## 2013-12-04 LAB — ACETAMINOPHEN LEVEL: Acetaminophen (Tylenol), Serum: <15 ug/mL (ref 10–30)

## 2013-12-04 LAB — PHOSPHORUS: PHOSPHORUS: 4 mg/dL (ref 2.3–4.6)

## 2013-12-04 LAB — TSH: TSH: 0.788 u[IU]/mL (ref 0.350–4.500)

## 2013-12-04 LAB — ETHANOL: Alcohol, Ethyl (B): 154 mg/dL — ABNORMAL HIGH (ref 0–11)

## 2013-12-04 MED ORDER — ONDANSETRON HCL 4 MG/2ML IJ SOLN
4.0000 mg | Freq: Once | INTRAMUSCULAR | Status: AC
  Administered 2013-12-04: 4 mg via INTRAVENOUS
  Filled 2013-12-04: qty 2

## 2013-12-04 MED ORDER — PANTOPRAZOLE SODIUM 40 MG IV SOLR
40.0000 mg | Freq: Once | INTRAVENOUS | Status: AC
  Administered 2013-12-04: 40 mg via INTRAVENOUS
  Filled 2013-12-04: qty 40

## 2013-12-04 MED ORDER — SODIUM CHLORIDE 0.9 % IV SOLN
8.0000 mg/h | INTRAVENOUS | Status: DC
  Administered 2013-12-04 – 2013-12-05 (×4): 8 mg/h via INTRAVENOUS
  Filled 2013-12-04 (×10): qty 80

## 2013-12-04 MED ORDER — ACETAMINOPHEN 650 MG RE SUPP: 650.0000 mg | Freq: Four times a day (QID) | RECTAL | Status: DC | PRN

## 2013-12-04 MED ORDER — METOPROLOL TARTRATE 1 MG/ML IV SOLN
10.0000 mg | Freq: Three times a day (TID) | INTRAVENOUS | Status: DC
  Administered 2013-12-04 – 2013-12-06 (×8): 10 mg via INTRAVENOUS
  Filled 2013-12-04 (×11): qty 10

## 2013-12-04 MED ORDER — LABETALOL HCL 200 MG PO TABS: 200.0000 mg | ORAL_TABLET | Freq: Two times a day (BID) | ORAL | Status: DC

## 2013-12-04 MED ORDER — CHLORHEXIDINE GLUCONATE 0.12 % MT SOLN: 15.0000 mL | Freq: Two times a day (BID) | OROMUCOSAL | Status: DC

## 2013-12-04 MED ORDER — CETYLPYRIDINIUM CHLORIDE 0.05 % MT LIQD: 7.0000 mL | Freq: Two times a day (BID) | OROMUCOSAL | Status: DC

## 2013-12-04 MED ORDER — ALBUTEROL SULFATE (2.5 MG/3ML) 0.083% IN NEBU
2.5000 mg | INHALATION_SOLUTION | RESPIRATORY_TRACT | Status: DC | PRN
  Administered 2013-12-04 – 2013-12-05 (×2): 2.5 mg via RESPIRATORY_TRACT
  Filled 2013-12-04 (×2): qty 3

## 2013-12-04 MED ORDER — ACETAMINOPHEN 325 MG PO TABS
650.0000 mg | ORAL_TABLET | Freq: Four times a day (QID) | ORAL | Status: DC | PRN
  Administered 2013-12-07: 650 mg via ORAL
  Filled 2013-12-04: qty 2

## 2013-12-04 MED ORDER — LORAZEPAM 2 MG/ML IJ SOLN
1.0000 mg | Freq: Four times a day (QID) | INTRAMUSCULAR | Status: DC | PRN
  Administered 2013-12-04: 1 mg via INTRAVENOUS
  Filled 2013-12-04: qty 1

## 2013-12-04 MED ORDER — METOPROLOL TARTRATE 1 MG/ML IV SOLN
INTRAVENOUS | Status: AC
  Filled 2013-12-04: qty 5

## 2013-12-04 MED ORDER — FOLIC ACID 1 MG PO TABS
1.0000 mg | ORAL_TABLET | Freq: Every day | ORAL | Status: DC
  Administered 2013-12-04 – 2013-12-07 (×4): 1 mg via ORAL
  Filled 2013-12-04 (×4): qty 1

## 2013-12-04 MED ORDER — SODIUM CHLORIDE 0.9 % IV BOLUS (SEPSIS)
1000.0000 mL | Freq: Once | INTRAVENOUS | Status: AC
  Administered 2013-12-04: 1000 mL via INTRAVENOUS

## 2013-12-04 MED ORDER — HYDRALAZINE HCL 20 MG/ML IJ SOLN
10.0000 mg | Freq: Four times a day (QID) | INTRAMUSCULAR | Status: DC
  Administered 2013-12-04 – 2013-12-07 (×11): 10 mg via INTRAVENOUS
  Filled 2013-12-04: qty 1
  Filled 2013-12-04: qty 0.5
  Filled 2013-12-04 (×2): qty 1
  Filled 2013-12-04: qty 0.5
  Filled 2013-12-04: qty 1
  Filled 2013-12-04: qty 0.5
  Filled 2013-12-04: qty 1
  Filled 2013-12-04: qty 0.5
  Filled 2013-12-04: qty 1
  Filled 2013-12-04 (×2): qty 0.5
  Filled 2013-12-04: qty 1
  Filled 2013-12-04: qty 0.5
  Filled 2013-12-04: qty 1
  Filled 2013-12-04: qty 0.5

## 2013-12-04 MED ORDER — VITAMIN B-1 100 MG PO TABS
100.0000 mg | ORAL_TABLET | Freq: Every day | ORAL | Status: DC
  Administered 2013-12-04 – 2013-12-07 (×4): 100 mg via ORAL
  Filled 2013-12-04 (×4): qty 1

## 2013-12-04 MED ORDER — LORAZEPAM 1 MG PO TABS
1.0000 mg | ORAL_TABLET | Freq: Four times a day (QID) | ORAL | Status: DC | PRN
  Administered 2013-12-05: 1 mg via ORAL
  Filled 2013-12-04: qty 1

## 2013-12-04 MED ORDER — HYDRALAZINE HCL 20 MG/ML IJ SOLN
10.0000 mg | Freq: Four times a day (QID) | INTRAMUSCULAR | Status: DC | PRN
  Administered 2013-12-04: 10 mg via INTRAVENOUS
  Filled 2013-12-04 (×2): qty 1

## 2013-12-04 MED ORDER — PANTOPRAZOLE SODIUM 40 MG IV SOLR: 40.0000 mg | Freq: Two times a day (BID) | INTRAVENOUS | Status: DC

## 2013-12-04 MED ORDER — MORPHINE SULFATE 2 MG/ML IJ SOLN
1.0000 mg | INTRAMUSCULAR | Status: DC | PRN
Start: 2013-12-04 — End: 2013-12-07
  Administered 2013-12-04 – 2013-12-07 (×16): 1 mg via INTRAVENOUS
  Filled 2013-12-04 (×17): qty 1

## 2013-12-04 MED ORDER — CHLORHEXIDINE GLUCONATE 0.12 % MT SOLN
15.0000 mL | Freq: Two times a day (BID) | OROMUCOSAL | Status: DC
  Administered 2013-12-05 – 2013-12-07 (×5): 15 mL via OROMUCOSAL
  Filled 2013-12-04 (×8): qty 15

## 2013-12-04 MED ORDER — THIAMINE HCL 100 MG/ML IJ SOLN
100.0000 mg | Freq: Every day | INTRAMUSCULAR | Status: DC
  Filled 2013-12-04: qty 1
  Filled 2013-12-04: qty 2
  Filled 2013-12-04: qty 1

## 2013-12-04 MED ORDER — SODIUM CHLORIDE 0.9 % IV SOLN
INTRAVENOUS | Status: DC
  Administered 2013-12-04: 14:00:00 via INTRAVENOUS
  Administered 2013-12-05: 1000 mL via INTRAVENOUS
  Administered 2013-12-06 (×3): via INTRAVENOUS

## 2013-12-04 MED ORDER — ONDANSETRON HCL 4 MG PO TABS: 4.0000 mg | ORAL_TABLET | Freq: Four times a day (QID) | ORAL | Status: DC | PRN

## 2013-12-04 MED ORDER — ADULT MULTIVITAMIN W/MINERALS CH
1.0000 | ORAL_TABLET | Freq: Every day | ORAL | Status: DC
  Administered 2013-12-04 – 2013-12-07 (×4): 1 via ORAL
  Filled 2013-12-04 (×4): qty 1

## 2013-12-04 MED ORDER — CETYLPYRIDINIUM CHLORIDE 0.05 % MT LIQD
7.0000 mL | Freq: Two times a day (BID) | OROMUCOSAL | Status: DC
  Administered 2013-12-04 – 2013-12-06 (×4): 7 mL via OROMUCOSAL

## 2013-12-04 MED ORDER — SODIUM CHLORIDE 0.9 % IJ SOLN
3.0000 mL | Freq: Two times a day (BID) | INTRAMUSCULAR | Status: DC
  Administered 2013-12-04 – 2013-12-05 (×4): 3 mL via INTRAVENOUS

## 2013-12-04 MED ORDER — METOPROLOL TARTRATE 1 MG/ML IV SOLN
5.0000 mg | Freq: Three times a day (TID) | INTRAVENOUS | Status: DC
  Filled 2013-12-04: qty 5

## 2013-12-04 MED ORDER — ONDANSETRON HCL 4 MG/2ML IJ SOLN
4.0000 mg | Freq: Four times a day (QID) | INTRAMUSCULAR | Status: DC | PRN
  Administered 2013-12-04 – 2013-12-07 (×4): 4 mg via INTRAVENOUS
  Filled 2013-12-04 (×4): qty 2

## 2013-12-04 MED ORDER — HYDRALAZINE HCL 20 MG/ML IJ SOLN
10.0000 mg | Freq: Three times a day (TID) | INTRAMUSCULAR | Status: DC | PRN
  Administered 2013-12-04: 10 mg via INTRAVENOUS

## 2013-12-04 NOTE — ED Notes (Signed)
Attempted to call report. Receiving nurse with another patient and is to return call.

## 2013-12-04 NOTE — Progress Notes (Signed)
CARE MANAGEMENT NOTE 12/04/2013  Patient:  [name], [name]   Account Number:  [id number]  Date Initiated:  12/04/2013  Documentation initiated by:  Ernesha Ramone  Subjective/Objective Assessment:   poss varicles bled in a known etoh and substance abuse patient/ethanol level 154 on admit     Action/Plan:   tbd   Anticipated DC Date:  12/07/2013   Anticipated DC Plan:  HOME/SELF CARE  In-house referral  Clinical Social Worker      DC Planning Services  CM consult      [facility] Choice  NA   Choice offered to / List presented to:  NA   DME arranged  NA      DME agency  NA     [place] arranged  NA      [place] agency  NA   Status of service:  In process, will continue to follow Medicare Important Message given?  NA - LOS <3 / Initial given by admissions (If response is "NO", the following Medicare IM given date fields will be blank) Date Medicare IM given:   Medicare IM given by:   Date Additional Medicare IM given:   Additional Medicare IM given by:    Discharge Disposition:    Per UR Regulation:  Reviewed for med. necessity/level of care/duration of stay  If discussed at [place] of Stay Meetings, dates discussed:    Comments:  Suanne Marker Gerren Hoffmeier,RN,BSN,CCM

## 2013-12-04 NOTE — ED Notes (Signed)
Istat CG4 Lactic Acid was given to Dr. Jeneen Rinks. And he was going to give it to Dr. Wilson Singer.

## 2013-12-04 NOTE — ED Notes (Signed)
Bed: TA56 Expected date:  Expected time:  Means of arrival:  Comments: EMS- vomiting blood after ETOH

## 2013-12-04 NOTE — ED Notes (Signed)
Per EMS- Patient reports vomiting dark red blood every 30 minutes since he stopped drinking at 0200 today. Patient reported to EMS that he has been drinking lots. Shearon Stalls and beer). Patient was given zofran 4 mg IV by EMS prior to arrival to the ED.

## 2013-12-04 NOTE — ED Notes (Signed)
Patient's girlfriend/Tara  [phone]

## 2013-12-04 NOTE — H&P (Addendum)
[facility] History and Physical  [name] VHQ:[id number] DOB: 1965/08/14 DOA: 12/04/2013  Referring physician: ER physician PCP: No PCP Per Patient   Chief Complaint: hematemesis   HPI:  48 year old male with past medical history of alcohol abuse who presented to [facility] ED 12/04/2013 with ongoing hematemesis for past couple of hours prior to this admission. Last alcoholic beverage was at 4 am prior to this admission. Pt reported abdominal discomfort and nausea in addition to hematemesis. No chest pain, shortness of breath or palpitations. No fevers or chills. No loss of consciousness. Pt had another episode of hematemesis in ED but has improved with Zofran.  In ED, pt was hemodynamically stable with BP 119/83, HR 111, T max 97.8 F and oxygen saturation of 95% on room air. Blood work showed WBC count of 16.2 otherwise unremarkable. Alcohol level on admission was 154. Lactic acid was 3.53. UA showed few ketones. He was started on protonix drip on admission.   Assessment & Plan    Active Problems: Acute upper GI bleed / Hematemesis  Hematemesis likely due to alcohol, possible variceal bleed  Appreciate ED consulting GI for possible EGD.  Continue protonix drip  NPO; replete electrolytes  Admission to SDU  Hypertension  Use metoprolol 5 mg every 8 hours IV and hydralazine IV PRN Lactic acidosis  Likely due to acute alcohol abuse, hematemesis  No evidence of infection  Leukocytosis  Likely reactive  No evidence of infection Acute alcohol intoxication  Alcohol level was 154 on admission  CIWA protocol ordered  Continue supportive care (IV fluids, thiamine, MVI and folic acid)  Withdrawal precaution   DVT prophylaxis:   SCD's due to risk of bleeding   Radiological Exams on Admission: No results found.  EKG: sinus tachycardia  Code Status: Full Family Communication: Plan of care discussed with the patient  Disposition Plan: Admit for further  evaluation  [name], MD  [facility] Pager [phone]  Review of Systems:  Constitutional: Negative for fever, chills and malaise/fatigue. Negative for diaphoresis.  HENT: Negative for hearing loss, ear pain, nosebleeds, congestion, sore throat, neck pain, tinnitus and ear discharge.   Eyes: Negative for blurred vision, double vision, photophobia, pain, discharge and redness.  Respiratory: Negative for cough, hemoptysis, sputum production, shortness of breath, wheezing and stridor.   Cardiovascular: Negative for chest pain, palpitations, orthopnea, claudication and leg swelling.  Gastrointestinal: per HPI.  Genitourinary: Negative for dysuria, urgency, frequency, hematuria and flank pain.  Musculoskeletal: Negative for myalgias, back pain, joint pain and falls.  Skin: Negative for itching and rash.  Neurological: Negative for dizziness and weakness. Negative for tingling, tremors, sensory change, speech change, focal weakness, loss of consciousness and headaches.  Endo/Heme/Allergies: Negative for environmental allergies and polydipsia. Does not bruise/bleed easily.  Psychiatric/Behavioral: Negative for suicidal ideas. The patient is not nervous/anxious.      Past Medical History  Diagnosis Date  . Hypertension   . Alcoholism     pt is also bi polar  . Stroke 05/15/2010    pt states stroke in 2011   History reviewed. No pertinent past surgical history. Social History:  reports that he has been smoking Cigarettes.  He has been smoking about 0.00 packs per day. He has never used smokeless tobacco. He reports that he drinks alcohol. He reports that he does not use illicit drugs.  Allergies  Allergen Reactions  . Aspirin Swelling    Family History: htn in parents.    Prior to Admission medications  Medication Sig Start Date End Date Taking? Authorizing Provider  albuterol (PROVENTIL HFA;VENTOLIN HFA) 108 (90 BASE) MCG/ACT inhaler Inhale 1 puff into the lungs 2 (two)  times daily as needed for wheezing or shortness of breath.   Yes Historical Provider, MD  lisinopril (PRINIVIL,ZESTRIL) 10 MG tablet Take 10 mg by mouth daily.   Yes Historical Provider, MD  oxyCODONE-acetaminophen (PERCOCET/ROXICET) 5-325 MG per tablet Take 2 tablets by mouth every 4 (four) hours as needed (pain.). 11/01/13  Yes Flint Melter, MD  pindolol (VISKEN) 5 MG tablet Take 5 mg by mouth 2 (two) times daily.   Yes Historical Provider, MD  sucralfate (CARAFATE) 1 G tablet Take 1 g by mouth 4 (four) times daily.   Yes Historical Provider, MD   Physical Exam: Filed Vitals:   12/04/13 1030 12/04/13 1100 12/04/13 1130 12/04/13 1200  BP: 150/100 143/103 136/100 135/98  Pulse: 115 111 113 114  Temp:      Resp: 17 25 24    SpO2: 100% 97% 97% 97%    Physical Exam  Constitutional: Appears well-developed and well-nourished. Ashy color face HENT: Normocephalic. No tonsillar erythema or exudates Eyes: Conjunctivae and EOM are normal. PERRLA Neck: Normal ROM. Neck supple. No JVD. No tracheal deviation. No thyromegaly.  CVS: RRR, S1/S2 appreciated  Pulmonary: Effort and breath sounds normal, no stridor, rhonchi, wheezes, rales.  Abdominal: Soft. BS +,  no distension, tenderness, rebound or guarding.  Musculoskeletal: Normal range of motion. No edema and no tenderness.  Lymphadenopathy: No lymphadenopathy noted, cervical, inguinal. Neuro: Alert. Normal reflexes, muscle tone coordination. No focal neurologic deficits. Skin: Skin is warm and dry. Marland Kitchen  Psychiatric: Normal mood and affect. Behavior, judgment, thought content normal.   Labs on Admission:  Basic Metabolic Panel:  Recent Labs Lab 12/04/13 1017  NA 136*  K 3.9  CL 95*  CO2 19  GLUCOSE 89  BUN 6  CREATININE 0.69  CALCIUM 8.3*  MG 1.9  PHOS 4.0   Liver Function Tests:  Recent Labs Lab 12/04/13 1017  AST 24  ALT 7  ALKPHOS 132*  BILITOT 0.2*  PROT 7.5  ALBUMIN 3.6    Recent Labs Lab 12/04/13 1017  LIPASE 7*    No results found for this basename: AMMONIA,  in the last 168 hours CBC:  Recent Labs Lab 12/04/13 1017  WBC 16.2*  NEUTROABS 13.4*  HGB 14.0  HCT 40.3  MCV 85.0  PLT 299   Cardiac Enzymes: No results found for this basename: CKTOTAL, CKMB, CKMBINDEX, TROPONINI,  in the last 168 hours BNP: No components found with this basename: POCBNP,  CBG: No results found for this basename: GLUCAP,  in the last 168 hours  If 7PM-7AM, please contact night-coverage [email or web address] Password [email or web address] 12/04/2013, 1:32 PM

## 2013-12-04 NOTE — Consult Note (Signed)
Subjective:   HPI  The patient is a 48 year old male with a history of alcohol abuse. He drinks alcohol regularly but states that this weekend he was drinking excessively. He states that last night he started to vomit and was vomiting blood all night long. He described it as a red and dark. He presented to the emergency room and was admitted to the hospital. He has some upper abdominal pain. 5 years ago he had an EGD for similar presentation. He was found to have erosive esophagitis, duodenitis, and a hiatal hernia. No esophageal varices. He is feeling better at this time.  Review of Systems No chest pain or shortness of breath  Past Medical History  Diagnosis Date  . Hypertension   . Alcoholism     pt is also bi polar  . Stroke 05/15/2010    pt states stroke in 2011   History reviewed. No pertinent past surgical history. History   Social History  . Marital Status: Legally Separated    Spouse Name: N/A    Number of Children: N/A  . Years of Education: N/A   Occupational History  . Not on file.   Social History Main Topics  . Smoking status: Heavy Tobacco Smoker    Types: Cigarettes  . Smokeless tobacco: Never Used  . Alcohol Use: Yes     Comment: a fifth a day  . Drug Use: No  . Sexual Activity: Not on file   Other Topics Concern  . Not on file   Social History Narrative  . No narrative on file   family history is not on file. Current facility-administered medications:0.9 %  sodium chloride infusion, , Intravenous, Continuous, Robbie Lis, MD, Last Rate: 100 mL/hr at 12/04/13 1400;  acetaminophen (TYLENOL) suppository 650 mg, 650 mg, Rectal, Q6H PRN, Robbie Lis, MD;  acetaminophen (TYLENOL) tablet 650 mg, 650 mg, Oral, Q6H PRN, Robbie Lis, MD;  albuterol (PROVENTIL) (2.5 MG/3ML) 0.083% nebulizer solution 2.5 mg, 2.5 mg, Nebulization, Q2H PRN, Robbie Lis, MD folic acid (FOLVITE) tablet 1 mg, 1 mg, Oral, Daily, Robbie Lis, MD;  hydrALAZINE (APRESOLINE)  injection 10 mg, 10 mg, Intravenous, Q6H PRN, Robbie Lis, MD, 10 mg at 12/04/13 1443;  LORazepam (ATIVAN) injection 1 mg, 1 mg, Intravenous, Q6H PRN, Robbie Lis, MD;  LORazepam (ATIVAN) tablet 1 mg, 1 mg, Oral, Q6H PRN, Robbie Lis, MD metoprolol (LOPRESSOR) injection 10 mg, 10 mg, Intravenous, 3 times per day, Robbie Lis, MD, 10 mg at 12/04/13 1333;  morphine 2 MG/ML injection 1 mg, 1 mg, Intravenous, Q4H PRN, Robbie Lis, MD, 1 mg at 12/04/13 1225;  multivitamin with minerals tablet 1 tablet, 1 tablet, Oral, Daily, Robbie Lis, MD;  ondansetron [facility]) injection 4 mg, 4 mg, Intravenous, Q6H PRN, Robbie Lis, MD, 4 mg at 12/04/13 1418 ondansetron (ZOFRAN) tablet 4 mg, 4 mg, Oral, Q6H PRN, Robbie Lis, MD;  pantoprazole (PROTONIX) 80 mg in sodium chloride 0.9 % 250 mL (0.32 mg/mL) infusion, 8 mg/hr, Intravenous, Continuous, Elwyn Lade, PA-C, Last Rate: 25 mL/hr at 12/04/13 1400, 8 mg/hr at 12/04/13 1400;  [START ON 12/07/2013] pantoprazole (PROTONIX) injection 40 mg, 40 mg, Intravenous, Q12H, Hannah S Merrell, PA-C sodium chloride 0.9 % injection 3 mL, 3 mL, Intravenous, Q12H, Robbie Lis, MD, 3 mL at 12/04/13 1230;  thiamine (B-1) injection 100 mg, 100 mg, Intravenous, Daily, Robbie Lis, MD;  thiamine (VITAMIN B-1) tablet 100 mg, 100 mg, Oral,  Daily, Robbie Lis, MD Allergies  Allergen Reactions  . Aspirin Swelling     Objective:     BP 153/106  Pulse 118  Temp(Src) 98.4 F (36.9 C) (Oral)  Resp 24  SpO2 95%  He is alert. He does not appear in any acute distress  Heart regular rhythm Lungs clear  Abdomen: Soft and nontender, no obvious hepatosplenomegaly  Laboratory No components found with this basename: d1      Assessment:     #1. Alcohol abuse #2. Hematemesis. From his description of vomiting bright red blood and dark blood at all night long this does not sound like a variceal bleed. Also his hemoglobin is normal. He does not appear in any distress  at this particular time.      Plan:     PPI therapy. We will plan elective endoscopy tomorrow. I do not believe he needs emergent endoscopy. Watch for drop in H&H. Transfuse as necessary.     Component Value Date/Time   WBC 14.2* 12/04/2013 1400   HGB 13.0 12/04/2013 1400   HCT 37.7* 12/04/2013 1400   PLT 275 12/04/2013 1400   ALT 6 12/04/2013 1400   AST 24 12/04/2013 1400   NA 137 12/04/2013 1400   K 4.2 12/04/2013 1400   CL 98 12/04/2013 1400   CREATININE 0.71 12/04/2013 1400   BUN 6 12/04/2013 1400   CO2 19 12/04/2013 1400   CALCIUM 8.2* 12/04/2013 1400   PHOS 4.0 12/04/2013 1017   ALKPHOS 123* 12/04/2013 1400

## 2013-12-04 NOTE — ED Provider Notes (Signed)
CSN: [id number]     Arrival date & time 12/04/13  5176 History   First MD Initiated Contact with Patient 12/04/13 0935     Chief Complaint  Patient presents with  . Hematemesis  . Alcohol Intoxication     (Consider location/radiation/quality/duration/timing/severity/associated sxs/prior Treatment) HPI Comments: Patient is a 48 year old male with history of hypertension and alcoholism who presents to the emergency department today with hematemesis. He reports that this began at 7 AM and he has had approximately 4 episodes. He describes it as bright red blood as well as coffee ground emesis. He states "it looks like there's chewed tobacco in my vomit". He is having epigastric pain which he describes as burning.  He has history of endoscopy done 5 years ago. He reports he does not have esophageal varices. No history of cirrhosis. He denies any melena or BRBPR. No fevers, chills. He drinks a 1/5 of liquor daily.   Patient is a 48 y.o. male presenting with intoxication. The history is provided by the patient. No language interpreter was used.  Alcohol Intoxication Associated symptoms include abdominal pain, nausea and vomiting. Pertinent negatives include no chest pain, chills or fever.    Past Medical History  Diagnosis Date  . Hypertension   . Alcoholism     pt is also bi polar  . Stroke 05/15/2010    pt states stroke in 2011   No past surgical history on file. No family history on file. History  Substance Use Topics  . Smoking status: Heavy Tobacco Smoker    Types: Cigarettes  . Smokeless tobacco: Not on file  . Alcohol Use: No    Review of Systems  Constitutional: Negative for fever and chills.  Respiratory: Negative for shortness of breath.   Cardiovascular: Negative for chest pain.  Gastrointestinal: Positive for nausea, vomiting and abdominal pain. Negative for diarrhea.  All other systems reviewed and are negative.     Allergies  Aspirin  Home Medications   Prior  to Admission medications   Medication Sig Start Date End Date Taking? Authorizing Provider  albuterol (PROVENTIL HFA;VENTOLIN HFA) 108 (90 BASE) MCG/ACT inhaler Inhale 1 puff into the lungs 2 (two) times daily as needed for wheezing or shortness of breath.   Yes Historical Provider, MD  lisinopril (PRINIVIL,ZESTRIL) 10 MG tablet Take 10 mg by mouth daily.   Yes Historical Provider, MD  oxyCODONE-acetaminophen (PERCOCET/ROXICET) 5-325 MG per tablet Take 2 tablets by mouth every 4 (four) hours as needed (pain.). 11/01/13  Yes Richarda Blade, MD  pindolol (VISKEN) 5 MG tablet Take 5 mg by mouth 2 (two) times daily.   Yes Historical Provider, MD  sucralfate (CARAFATE) 1 G tablet Take 1 g by mouth 4 (four) times daily.   Yes Historical Provider, MD   BP 153/106  Pulse 118  Temp(Src) 98.4 F (36.9 C) (Oral)  Resp 24  SpO2 95% Physical Exam  Nursing note and vitals reviewed. Constitutional: He is oriented to person, place, and time. He appears well-developed and well-nourished. No distress.  HENT:  Head: Normocephalic and atraumatic.  Right Ear: External ear normal.  Left Ear: External ear normal.  Nose: Nose normal.  Eyes: Conjunctivae are normal.  Neck: Normal range of motion. No tracheal deviation present.  Cardiovascular: Regular rhythm, normal heart sounds, intact distal pulses and normal pulses.  Tachycardia present.   Pulses:      Radial pulses are 2+ on the right side, and 2+ on the left side.  Pulmonary/Chest: Effort normal and  breath sounds normal. No stridor.  Abdominal: Soft. He exhibits no distension. There is tenderness in the right upper quadrant and epigastric area. There is no rigidity, no rebound and no guarding.  Musculoskeletal: Normal range of motion.  Neurological: He is alert and oriented to person, place, and time.  Skin: Skin is warm and dry. He is not diaphoretic.  Psychiatric: He has a normal mood and affect. His behavior is normal.    ED Course  Procedures  (including critical care time) Labs Review Labs Reviewed  CBC WITH DIFFERENTIAL - Abnormal; Notable for the following:    WBC 16.2 (*)    Neutrophils Relative % 83 (*)    Neutro Abs 13.4 (*)    All other components within normal limits  COMPREHENSIVE METABOLIC PANEL - Abnormal; Notable for the following:    Sodium 136 (*)    Chloride 95 (*)    Calcium 8.3 (*)    Alkaline Phosphatase 132 (*)    Total Bilirubin 0.2 (*)    Anion gap 22 (*)    All other components within normal limits  LIPASE, BLOOD - Abnormal; Notable for the following:    Lipase 7 (*)    All other components within normal limits  ETHANOL - Abnormal; Notable for the following:    Alcohol, Ethyl (B) 154 (*)    All other components within normal limits  SALICYLATE LEVEL - Abnormal; Notable for the following:    Salicylate Lvl <3.3 (*)    All other components within normal limits  URINALYSIS, ROUTINE W REFLEX MICROSCOPIC - Abnormal; Notable for the following:    Ketones, ur 15 (*)    All other components within normal limits  COMPREHENSIVE METABOLIC PANEL - Abnormal; Notable for the following:    Calcium 8.2 (*)    Albumin 3.4 (*)    Alkaline Phosphatase 123 (*)    Anion gap 20 (*)    All other components within normal limits  CBC WITH DIFFERENTIAL - Abnormal; Notable for the following:    WBC 14.2 (*)    HCT 37.7 (*)    Neutro Abs 10.7 (*)    All other components within normal limits  I-STAT CG4 LACTIC ACID, ED - Abnormal; Notable for the following:    Lactic Acid, Venous 3.53 (*)    All other components within normal limits  MRSA PCR SCREENING  OCCULT BLOOD GASTRIC / DUODENUM (SPECIMEN CUP)  ACETAMINOPHEN LEVEL  URINE RAPID DRUG SCREEN (HOSP PERFORMED)  PROTIME-INR  MAGNESIUM  PHOSPHORUS  APTT  PROTIME-INR  TSH  TYPE AND SCREEN    Imaging Review No results found.   EKG Interpretation None      MDM   Final diagnoses:  Hematemesis with nausea  Acute alcohol intoxication, with unspecified  complication  Leukocytosis, unspecified  Acute upper GI bleed    11:41 AM Discussed case with gastroenterology on call. Will admit to medicine, GI to evaluate patient in consultation.   Patient presents to ED after having episodes of hematemesis prior to arrival. Patient is alcohol, drinking a 1/5 of liquor daily. Last drink was approximately 4 hours prior to arrival. Patient with alcohol level of 154. Patient given 80mg  of protonix IV with infusion started. Patient feels improved after zofran. Currently, patient is hemodynamically stable. Admission and consultation appreciated. Discussed case with Dr. Wilson Singer who agrees with plan. Patient / Family / Caregiver informed of clinical course, understand medical decision-making process, and agree with plan.   Elwyn Lade, PA-C 12/04/13 [phone]

## 2013-12-05 ENCOUNTER — Encounter (HOSPITAL_COMMUNITY): Payer: Self-pay | Admitting: *Deleted

## 2013-12-05 ENCOUNTER — Encounter (HOSPITAL_COMMUNITY)
Admission: EM | Disposition: A | Discharge status: Home / Self Care | Payer: Self-pay | Source: Home / Self Care | Attending: Internal Medicine

## 2013-12-05 HISTORY — PX: ESOPHAGOGASTRODUODENOSCOPY: SHX5428

## 2013-12-05 LAB — CBC
HCT: 38.7 % — ABNORMAL LOW (ref 39.0–52.0)
Hemoglobin: 13.5 g/dL (ref 13.0–17.0)
MCH: 29.8 pg (ref 26.0–34.0)
MCHC: 34.9 g/dL (ref 30.0–36.0)
MCV: 85.4 fL (ref 78.0–100.0)
Platelets: 270 10*3/uL (ref 150–400)
RBC: 4.53 MIL/uL (ref 4.22–5.81)
RDW: 14.8 % (ref 11.5–15.5)
WBC: 11.7 10*3/uL — ABNORMAL HIGH (ref 4.0–10.5)

## 2013-12-05 LAB — COMPREHENSIVE METABOLIC PANEL
ALT: 7 U/L (ref 0–53)
AST: 38 U/L — ABNORMAL HIGH (ref 0–37)
Albumin: 3.4 g/dL — ABNORMAL LOW (ref 3.5–5.2)
Alkaline Phosphatase: 113 U/L (ref 39–117)
Anion gap: 16 — ABNORMAL HIGH (ref 5–15)
BILIRUBIN TOTAL: 0.5 mg/dL (ref 0.3–1.2)
BUN: 7 mg/dL (ref 6–23)
CHLORIDE: 99 meq/L (ref 96–112)
CO2: 19 meq/L (ref 19–32)
Calcium: 8.4 mg/dL (ref 8.4–10.5)
Creatinine, Ser: 0.71 mg/dL (ref 0.50–1.35)
GFR calc Af Amer: >90 mL/min (ref >90)
GLUCOSE: 83 mg/dL (ref 70–99)
Potassium: 3.7 mEq/L (ref 3.7–5.3)
SODIUM: 134 meq/L — AB (ref 137–147)
Total Protein: 6.8 g/dL (ref 6.0–8.3)

## 2013-12-05 SURGERY — EGD (ESOPHAGOGASTRODUODENOSCOPY)
Anesthesia: Moderate Sedation | Laterality: Left

## 2013-12-05 SURGERY — EGD (ESOPHAGOGASTRODUODENOSCOPY)
Anesthesia: Moderate Sedation

## 2013-12-05 MED ORDER — MIDAZOLAM HCL 10 MG/2ML IJ SOLN
INTRAMUSCULAR | Status: DC | PRN
  Administered 2013-12-05 (×2): 2.5 mg via INTRAVENOUS

## 2013-12-05 MED ORDER — FENTANYL CITRATE 0.05 MG/ML IJ SOLN
INTRAMUSCULAR | Status: AC
  Filled 2013-12-05: qty 2

## 2013-12-05 MED ORDER — BUTAMBEN-TETRACAINE-BENZOCAINE 2-2-14 % EX AERO
INHALATION_SPRAY | CUTANEOUS | Status: DC | PRN
  Administered 2013-12-05: 2 via TOPICAL

## 2013-12-05 MED ORDER — SODIUM CHLORIDE 0.9 % IV SOLN
INTRAVENOUS | Status: DC
  Administered 2013-12-05: 500 mL via INTRAVENOUS

## 2013-12-05 MED ORDER — MIDAZOLAM HCL 10 MG/2ML IJ SOLN
INTRAMUSCULAR | Status: AC
  Filled 2013-12-05: qty 4

## 2013-12-05 MED ORDER — FENTANYL CITRATE 0.05 MG/ML IJ SOLN
INTRAMUSCULAR | Status: DC | PRN
  Administered 2013-12-05 (×2): 25 ug via INTRAVENOUS

## 2013-12-05 MED ORDER — DIPHENHYDRAMINE HCL 50 MG/ML IJ SOLN
INTRAMUSCULAR | Status: AC
  Filled 2013-12-05: qty 1

## 2013-12-05 NOTE — Progress Notes (Signed)
Report given to [facility]. Patient is stable at transfer.

## 2013-12-05 NOTE — Progress Notes (Signed)
Agree with previous RN assessment. Will continue to monitor pt.

## 2013-12-05 NOTE — Interval H&P Note (Signed)
History and Physical Interval Note:  12/05/2013 1:59 PM  [name]  has presented today for surgery, with the diagnosis of hematemesis, abdominal pain  The various methods of treatment have been discussed with the patient and family. After consideration of risks, benefits and other options for treatment, the patient has consented to  Procedure(s): ESOPHAGOGASTRODUODENOSCOPY (EGD) (Left) as a surgical intervention .  The patient's history has been reviewed, patient examined, no change in status, stable for surgery.  I have reviewed the patient's chart and labs.  Questions were answered to the patient's satisfaction.     [name]  Assessment:  1.  Hematemesis. 2.  Epigastric abdominal pain.  Plan:  1.  Endoscopy with possible hemostatic therapy. 2.  Risks (bleeding, infection, bowel perforation that could require surgery, sedation-related changes in cardiopulmonary systems), benefits (identification and possible treatment of source of symptoms, exclusion of certain causes of symptoms), and alternatives (watchful waiting, radiographic imaging studies, empiric medical treatment) of upper endoscopy (EGD) were explained to patient/family in detail and patient wishes to proceed.

## 2013-12-05 NOTE — Progress Notes (Signed)
Subjective: Is hungry. No further hematemesis. Mild abdominal pain. No melena or hematochezia.  Objective: Vital signs in last 24 hours: Temp:  [98.2 F (36.8 C)-99.1 F (37.3 C)] 98.6 F (37 C) (09/08 0800) Pulse Rate:  [110-135] 112 (09/08 0600) Resp:  [16-32] 16 (09/08 0600) BP: (135-165)/(84-112) 140/85 mmHg (09/08 0600) SpO2:  [95 %-100 %] 96 % (09/08 0600) Weight:  [90.5 kg (199 lb 8.3 oz)-91.7 kg (202 lb 2.6 oz)] 90.5 kg (199 lb 8.3 oz) (09/08 0400) Weight change:  Last BM Date: 12/01/13  PE: GEN:  NAD, resting comfortably in bed ABD:  Mild epigastric tenderness, present but hypoactive bowel sounds, no peritonitis  Lab Results: CBC    Component Value Date/Time   WBC 11.7* 12/05/2013 0348   RBC 4.53 12/05/2013 0348   HGB 13.5 12/05/2013 0348   HCT 38.7* 12/05/2013 0348   PLT 270 12/05/2013 0348   MCV 85.4 12/05/2013 0348   MCH 29.8 12/05/2013 0348   MCHC 34.9 12/05/2013 0348   RDW 14.8 12/05/2013 0348   LYMPHSABS 2.8 12/04/2013 1400   MONOABS 0.6 12/04/2013 1400   EOSABS 0.0 12/04/2013 1400   BASOSABS 0.0 12/04/2013 1400   CMP     Component Value Date/Time   NA 134* 12/05/2013 0348   K 3.7 12/05/2013 0348   CL 99 12/05/2013 0348   CO2 19 12/05/2013 0348   GLUCOSE 83 12/05/2013 0348   BUN 7 12/05/2013 0348   CREATININE 0.71 12/05/2013 0348   CALCIUM 8.4 12/05/2013 0348   PROT 6.8 12/05/2013 0348   ALBUMIN 3.4* 12/05/2013 0348   AST 38* 12/05/2013 0348   ALT 7 12/05/2013 0348   ALKPHOS 113 12/05/2013 0348   BILITOT 0.5 12/05/2013 0348   GFRNONAA >90 12/05/2013 0348   GFRAA >90 12/05/2013 0348   Assessment:  1.  Hematemesis, resolved, with normal Hgb. 2.  Epigastric abdominal pain.  Plan:  1.  Continue PPI and medical therapies. 2.  Endoscopy later today (tentatively ~ 2 PM) for further investigation into patient's hematemesis and epigastric abdominal pain.  History not suggestive of variceal bleeding, and normal platelet count argues against cirrhosis with portal hypertension. 3.  Risks (bleeding,  infection, bowel perforation that could require surgery, sedation-related changes in cardiopulmonary systems), benefits (identification and possible treatment of source of symptoms, exclusion of certain causes of symptoms), and alternatives (watchful waiting, radiographic imaging studies, empiric medical treatment) of upper endoscopy (EGD) were explained to patient/family in detail and patient wishes to proceed.   [name] 12/05/2013, 9:49 AM

## 2013-12-05 NOTE — Progress Notes (Signed)
Patient ID: [name], male   DOB: 1966-01-12, 48 y.o.   MRN: [id number] [facility] PROGRESS NOTE  [name] QIO:[id number] DOB: 02-Oct-1965 DOA: 12/04/2013 PCP: No PCP Per Patient  Brief narrative: 48 year old male with past medical history of alcohol abuse who presented to [facility] ED 12/04/2013 with ongoing hematemesis and abdominal pain for past couple of hours prior to this admission. Last alcoholic beverage was at 4 am prior to this admission. In ED, pt was hemodynamically stable with BP 119/83, HR 111, T max 97.8 F and oxygen saturation of 95% on room air. Blood work showed WBC count of 16.2 otherwise unremarkable. Alcohol level on admission was 154. Lactic acid was 3.53. UA showed few ketones. He was started on protonix drip on admission.   Assessment & Plan   Principal Problem: Acute upper GI bleed / Hematemesis  Possible variceal bleed. Hematemesis resolved at this point. GI still plans on elective EGD this am, Pt initially on protonix drip which now changed to protonix 40 mg IV Q 12 hours  NPO prior to EGD but will advance diet as tolerated after EGD Hemodynamically stable and may transfer to telemetry floor today.  Active Problems:  Hypertension  Continue metoprolol 5 mg every 8 hours IV and hydralazine IV PRN Lactic acidosis  Likely due to acute alcohol abuse, hematemesis  No evidence of infection  Leukocytosis  Likely reactive  WBC count trended down from 16.2 to 11.7. No evidence of infection Acute alcohol intoxication  Alcohol level was 154 on admission  CIWA protocol ordered  Continue supportive care (IV fluids, thiamine, MVI and folic acid)  Withdrawal precaution  DVT prophylaxis:  SCD's due to risk of bleeding   Code Status: Full  Family Communication: Plan of care discussed with the patient  Disposition Plan: transfer to telemetry floor today    IV Access:   Peripheral IV Procedures and diagnostic studies:   No results found. Medical Consultants:    Gastroenterology (Dr. Herbert Moors) Other Consultants:   None  Anti-Infectives:   None    [name], MD  [facility] Pager [phone]  If 7PM-7AM, please contact night-coverage [email or web address] Password [email or web address] 12/05/2013, 8:07 AM   LOS: 1 day    HPI/Subjective: No acute overnight events.  Objective: Filed Vitals:   12/05/13 0300 12/05/13 0400 12/05/13 0500 12/05/13 0600  BP: 157/103  147/104 140/85  Pulse: 118 114 111 112  Temp:  98.2 F (36.8 C)    TempSrc:  Oral    Resp: 22 23 19 16   Height:      Weight:  90.5 kg (199 lb 8.3 oz)    SpO2: 97% 97% 95% 96%    Intake/Output Summary (Last 24 hours) at 12/05/13 0807 Last data filed at 12/05/13 0600  Gross per 24 hour  Intake 3115.83 ml  Output   1250 ml  Net 1865.83 ml    Exam:   General:  Pt is alert, follows commands appropriately, not in acute distress  Cardiovascular: Regular rate and rhythm, S1/S2, no murmurs  Respiratory: Clear to auscultation bilaterally, no wheezing, no crackles, no rhonchi  Abdomen: Soft, non tender, non distended, bowel sounds present  Extremities: No edema, pulses DP and PT palpable bilaterally  Neuro: Grossly nonfocal  Data Reviewed: Basic Metabolic Panel:  Recent Labs Lab 12/04/13 1017 12/04/13 1400 12/05/13 0348  NA 136* 137 134*  K 3.9 4.2 3.7  CL 95* 98 99  CO2 19 19 19   GLUCOSE 89 81 83  BUN 6 6 7   CREATININE 0.69 0.71 0.71  CALCIUM 8.3* 8.2* 8.4  MG 1.9  --   --   PHOS 4.0  --   --    Liver Function Tests:  Recent Labs Lab 12/04/13 1017 12/04/13 1400 12/05/13 0348  AST 24 24 38*  ALT 7 6 7   ALKPHOS 132* 123* 113  BILITOT 0.2* 0.3 0.5  PROT 7.5 6.9 6.8  ALBUMIN 3.6 3.4* 3.4*    Recent Labs Lab 12/04/13 1017  LIPASE 7*   No results found for this basename: AMMONIA,  in the last 168 hours CBC:  Recent Labs Lab 12/04/13 1017 12/04/13 1400 12/05/13 0348  WBC 16.2* 14.2* 11.7*  NEUTROABS 13.4* 10.7*  --   HGB 14.0 13.0 13.5  HCT  40.3 37.7* 38.7*  MCV 85.0 84.9 85.4  PLT 299 275 270   Cardiac Enzymes: No results found for this basename: CKTOTAL, CKMB, CKMBINDEX, TROPONINI,  in the last 168 hours BNP: No components found with this basename: POCBNP,  CBG: No results found for this basename: GLUCAP,  in the last 168 hours  MRSA PCR SCREENING     Status: None   Collection Time    12/04/13  1:27 PM      Result Value Ref Range Status   MRSA by PCR NEGATIVE  NEGATIVE Final     Scheduled Meds: . folic acid  1 mg Oral Daily  . hydrALAZINE  10 mg Intravenous Q6H  . metoprolol  10 mg Intravenous 3 times per day  . multivitamin   1 tablet Oral Daily  .  (PROTONIX) IV  40 mg Intravenous Q12H  . thiamine  100 mg Oral Daily   Or  . thiamine  100 mg Intravenous Daily   Continuous Infusions: . sodium chloride 100 mL/hr at 12/04/13 1400  . pantoprozole (PROTONIX) infusion 8 mg/hr (12/04/13 2138)

## 2013-12-05 NOTE — Progress Notes (Signed)
Received report from [facility], pt transferred from ICU to room 1438. Pt is alert and oriented, able to verbalize needs, will continue with current plan of care.

## 2013-12-05 NOTE — H&P (View-Only) (Signed)
Subjective: Is hungry. No further hematemesis. Mild abdominal pain. No melena or hematochezia.  Objective: Vital signs in last 24 hours: Temp:  [98.2 F (36.8 C)-99.1 F (37.3 C)] 98.6 F (37 C) (09/08 0800) Pulse Rate:  [110-135] 112 (09/08 0600) Resp:  [16-32] 16 (09/08 0600) BP: (135-165)/(84-112) 140/85 mmHg (09/08 0600) SpO2:  [95 %-100 %] 96 % (09/08 0600) Weight:  [90.5 kg (199 lb 8.3 oz)-91.7 kg (202 lb 2.6 oz)] 90.5 kg (199 lb 8.3 oz) (09/08 0400) Weight change:  Last BM Date: 12/01/13  PE: GEN:  NAD, resting comfortably in bed ABD:  Mild epigastric tenderness, present but hypoactive bowel sounds, no peritonitis  Lab Results: CBC    Component Value Date/Time   WBC 11.7* 12/05/2013 0348   RBC 4.53 12/05/2013 0348   HGB 13.5 12/05/2013 0348   HCT 38.7* 12/05/2013 0348   PLT 270 12/05/2013 0348   MCV 85.4 12/05/2013 0348   MCH 29.8 12/05/2013 0348   MCHC 34.9 12/05/2013 0348   RDW 14.8 12/05/2013 0348   LYMPHSABS 2.8 12/04/2013 1400   MONOABS 0.6 12/04/2013 1400   EOSABS 0.0 12/04/2013 1400   BASOSABS 0.0 12/04/2013 1400   CMP     Component Value Date/Time   NA 134* 12/05/2013 0348   K 3.7 12/05/2013 0348   CL 99 12/05/2013 0348   CO2 19 12/05/2013 0348   GLUCOSE 83 12/05/2013 0348   BUN 7 12/05/2013 0348   CREATININE 0.71 12/05/2013 0348   CALCIUM 8.4 12/05/2013 0348   PROT 6.8 12/05/2013 0348   ALBUMIN 3.4* 12/05/2013 0348   AST 38* 12/05/2013 0348   ALT 7 12/05/2013 0348   ALKPHOS 113 12/05/2013 0348   BILITOT 0.5 12/05/2013 0348   GFRNONAA >90 12/05/2013 0348   GFRAA >90 12/05/2013 0348   Assessment:  1.  Hematemesis, resolved, with normal Hgb. 2.  Epigastric abdominal pain.  Plan:  1.  Continue PPI and medical therapies. 2.  Endoscopy later today (tentatively ~ 2 PM) for further investigation into patient's hematemesis and epigastric abdominal pain.  History not suggestive of variceal bleeding, and normal platelet count argues against cirrhosis with portal hypertension. 3.  Risks (bleeding,  infection, bowel perforation that could require surgery, sedation-related changes in cardiopulmonary systems), benefits (identification and possible treatment of source of symptoms, exclusion of certain causes of symptoms), and alternatives (watchful waiting, radiographic imaging studies, empiric medical treatment) of upper endoscopy (EGD) were explained to patient/family in detail and patient wishes to proceed.   [name] 12/05/2013, 9:49 AM

## 2013-12-05 NOTE — Op Note (Signed)
[facility] [place], [id number]   ENDOSCOPY PROCEDURE REPORT  PATIENT: [name], [name]  MR#: [id number] BIRTHDATE: [birth year] , 48  yrs. old GENDER: Male ENDOSCOPIST: Arta Silence, MD REFERRED BY:  [facility] PROCEDURE DATE:  12/05/2013 PROCEDURE:  EGD, diagnostic ASA CLASS:     Class II INDICATIONS:  abdominal pain, hematemesis. MEDICATIONS: Fentanyl 50 mcg IV and Versed 5 mg IV TOPICAL ANESTHETIC: Cetacaine Spray  DESCRIPTION OF PROCEDURE: After the risks benefits and alternatives of the procedure were thoroughly explained, informed consent was obtained.  The forward-viewing diagnostic gastroscope was introduced through the mouth and advanced to the second portion of the duodenum. Without limitations.  The instrument was slowly withdrawn as the mucosa was fully examined.    Findings:  Severe (LA-D) erosive esophagitis involving the distal 1/3rd of the esophagus; no focal bleeding spot or visible vessel was identified.  Small hiatal hernia.  No esophageal varices.  Mild gastritis throughout the stomach without stigmata of bleeding. Otherwise normal stomach, including normal retroflexed view of cardia (no gastric varices were identified).  Mild erosions and erythema of duodenal bulb, of doubtful significance, otherwise normal duodenum  to the second portion. No old or fresh blood seen to the extent of our examination.             The scope was then withdrawn from the patient and the procedure completed.  ENDOSCOPIC IMPRESSION:     As above.  Esophagitis highly likely source of patient's epigastric abdominal pain and hematemesis.  No features present to suggest high risk for significant rebleeding.  RECOMMENDATIONS:     1.  Watch for potential complications of procedure. 2.  Continue IV PPI BID for another 24 hours, then transition to oral regimen. 3.  Keep head of bed elevated to at least 30 degrees at all times. 4.  Avoid  alcohol and NSAIDs. 5.  Clear liquid diet, advance slowly as tolerated. 6.  Conservative antireflux measures/precautions. 7.  [facility] will follow.  eSigned:  Arta Silence, MD 12/05/2013 2:36 PM   CC:

## 2013-12-06 ENCOUNTER — Encounter (HOSPITAL_COMMUNITY): Payer: Self-pay | Admitting: Gastroenterology

## 2013-12-06 DIAGNOSIS — K209 Esophagitis, unspecified without bleeding: Principal | ICD-10-CM

## 2013-12-06 DIAGNOSIS — I10 Essential (primary) hypertension: Secondary | ICD-10-CM

## 2013-12-06 DIAGNOSIS — F17200 Nicotine dependence, unspecified, uncomplicated: Secondary | ICD-10-CM | POA: Insufficient documentation

## 2013-12-06 DIAGNOSIS — F17210 Nicotine dependence, cigarettes, uncomplicated: Secondary | ICD-10-CM | POA: Insufficient documentation

## 2013-12-06 DIAGNOSIS — Z720 Tobacco use: Secondary | ICD-10-CM | POA: Diagnosis present

## 2013-12-06 LAB — GLUCOSE, CAPILLARY: Glucose-Capillary: 92 mg/dL (ref 70–99)

## 2013-12-06 MED ORDER — PANTOPRAZOLE SODIUM 40 MG PO TBEC
40.0000 mg | DELAYED_RELEASE_TABLET | Freq: Two times a day (BID) | ORAL | Status: DC
  Administered 2013-12-06 – 2013-12-07 (×3): 40 mg via ORAL
  Filled 2013-12-06 (×5): qty 1

## 2013-12-06 MED ORDER — NICOTINE 21 MG/24HR TD PT24
21.0000 mg | MEDICATED_PATCH | Freq: Every day | TRANSDERMAL | Status: DC
  Administered 2013-12-06: 21 mg via TRANSDERMAL
  Filled 2013-12-06 (×2): qty 1

## 2013-12-06 MED ORDER — HYDRALAZINE HCL 20 MG/ML IJ SOLN: 10.0000 mg | Freq: Four times a day (QID) | INTRAMUSCULAR | Status: DC | PRN

## 2013-12-06 NOTE — Progress Notes (Signed)
[facility] PROGRESS NOTE  GOVERNOR RAYGOZA ZOX:[id number] DOB: [birth year] DOA: 12/04/2013 PCP: No PCP Per Patient   Brief narrative 48 year old obese male with history of alcohol abuse presented with acute abdominal pain and hematemesis. Last alcohol intake was on the day of admission. Patient was  tachycardic but otherwise vitals stable. wBC of 16.2 with normal hemoglobin , alcohol level of 154 and Lactic acid of 3.5. Patient started on Protonix drip and admitted to telemetry.   Assessment/Plan: Acute upper GI bleed Secondary to erosive esophagitis seen on EGD. Switch to IV PPI. H&H stable. Advanced diet to full liquid. -Counseled strongly on etoh cessation  and avoid NSAIDs. Patient reports following at [facility] as outpatient. -Monitor H&H. Appreciate GI recommendation   Alcohol abuse Counseled on cessation. Monitor on CIWA. Continue thiamine, folate and multivitamin. Continue PPI  Tobacco abuse Counseled on cessation. Ordered nicotine patch.  Elevated blood pressure Placed on when necessary hydralazine  DVT prophylaxis: SCDs Diet: Full liquid  Code Status: Full code Family Communication: None at bedside Disposition Plan: Home possibly tomorrow if tolerating advanced diet and clinically improved   Consultants:  [facility]  Procedures:  EGD on 9/8  Antibiotics:  None  HPI/Subjective: Patient seen and examined. Reports some epigastric pain but no further vomiting or hematemesis  Objective: Filed Vitals:   12/06/13 1420  BP: 141/103  Pulse: 100  Temp: 98.6 F (37 C)  Resp: 19    Intake/Output Summary (Last 24 hours) at 12/06/13 1546 Last data filed at 12/06/13 1421  Gross per 24 hour  Intake   4360 ml  Output      3 ml  Net   4357 ml   Filed Weights   12/05/13 0400 12/05/13 1030 12/06/13 0547  Weight: 90.5 kg (199 lb 8.3 oz) 93 kg (205 lb 0.4 oz) 91.491 kg (201 lb 11.2 oz)    Exam:   General: Middle aged obese male in no acute  distress  HEENT: No pallor, moist oral mucosa  Cardiovascular: Normal S1 and S2, no murmurs  Chest: Clear to auscultation bilaterally, no added sounds  Abdomen:, Mild epigastric tenderness, nondistended, bowel sounds present  Musculoskeletal: Warm, no edema  CNS: Alert and oriented, no tremors   Data Reviewed: Basic Metabolic Panel:  Recent Labs Lab 12/04/13 1017 12/04/13 1400 12/05/13 0348  NA 136* 137 134*  K 3.9 4.2 3.7  CL 95* 98 99  CO2 19 19 19   GLUCOSE 89 81 83  BUN 6 6 7   CREATININE 0.69 0.71 0.71  CALCIUM 8.3* 8.2* 8.4  MG 1.9  --   --   PHOS 4.0  --   --    Liver Function Tests:  Recent Labs Lab 12/04/13 1017 12/04/13 1400 12/05/13 0348  AST 24 24 38*  ALT 7 6 7   ALKPHOS 132* 123* 113  BILITOT 0.2* 0.3 0.5  PROT 7.5 6.9 6.8  ALBUMIN 3.6 3.4* 3.4*    Recent Labs Lab 12/04/13 1017  LIPASE 7*   No results found for this basename: AMMONIA,  in the last 168 hours CBC:  Recent Labs Lab 12/04/13 1017 12/04/13 1400 12/05/13 0348  WBC 16.2* 14.2* 11.7*  NEUTROABS 13.4* 10.7*  --   HGB 14.0 13.0 13.5  HCT 40.3 37.7* 38.7*  MCV 85.0 84.9 85.4  PLT 299 275 270   Cardiac Enzymes: No results found for this basename: CKTOTAL, CKMB, CKMBINDEX, TROPONINI,  in the last 168 hours BNP (last 3 results) No results found for this basename:  PROBNP,  in the last 8760 hours CBG:  Recent Labs Lab 12/06/13 0737  GLUCAP 92    Recent Results (from the past 240 hour(s))  MRSA PCR SCREENING     Status: None   Collection Time    12/04/13  1:27 PM      Result Value Ref Range Status   MRSA by PCR NEGATIVE  NEGATIVE Final   Comment:            The GeneXpert MRSA Assay (FDA     approved for NASAL specimens     only), is one component of a     comprehensive MRSA colonization     surveillance program. It is not     intended to diagnose MRSA     infection nor to guide or     monitor treatment for     MRSA infections.     Studies: No results  found.  Scheduled Meds: . antiseptic oral rinse  7 mL Mouth Rinse q12n4p  . chlorhexidine  15 mL Mouth Rinse BID  . folic acid  1 mg Oral Daily  . hydrALAZINE  10 mg Intravenous Q6H  . metoprolol  10 mg Intravenous 3 times per day  . multivitamin with minerals  1 tablet Oral Daily  . nicotine  21 mg Transdermal Daily  . pantoprazole  40 mg Oral BID  . sodium chloride  3 mL Intravenous Q12H  . thiamine  100 mg Oral Daily   Or  . thiamine  100 mg Intravenous Daily   Continuous Infusions: . sodium chloride 100 mL/hr at 12/06/13 1228      Time spent: 25 minutes    Laney Bagshaw  [facility] Pager [phone] If 7PM-7AM, please contact night-coverage at [email or web address], password [facility] 12/06/2013, 3:46 PM  LOS: 2 days

## 2013-12-06 NOTE — Progress Notes (Signed)
Subjective: Abdominal pain improving. No further hematemesis.  Objective: Vital signs in last 24 hours: Temp:  [98.2 F (36.8 C)-99 F (37.2 C)] 98.5 F (36.9 C) (09/09 0547) Pulse Rate:  [92-112] 92 (09/09 0826) Resp:  [10-25] 20 (09/09 0547) BP: (119-161)/(74-117) 119/74 mmHg (09/09 0826) SpO2:  [94 %-100 %] 99 % (09/09 0547) Weight:  [91.491 kg (201 lb 11.2 oz)] 91.491 kg (201 lb 11.2 oz) (09/09 0547) Weight change: 1.3 kg (2 lb 13.9 oz) Last BM Date: 12/05/13  PE: GEN:  NAD ABD:  Soft, mild epigastric tenderness (improving)  Lab Results: CBC    Component Value Date/Time   WBC 11.7* 12/05/2013 0348   RBC 4.53 12/05/2013 0348   HGB 13.5 12/05/2013 0348   HCT 38.7* 12/05/2013 0348   PLT 270 12/05/2013 0348   MCV 85.4 12/05/2013 0348   MCH 29.8 12/05/2013 0348   MCHC 34.9 12/05/2013 0348   RDW 14.8 12/05/2013 0348   LYMPHSABS 2.8 12/04/2013 1400   MONOABS 0.6 12/04/2013 1400   EOSABS 0.0 12/04/2013 1400   BASOSABS 0.0 12/04/2013 1400   CMP     Component Value Date/Time   NA 134* 12/05/2013 0348   K 3.7 12/05/2013 0348   CL 99 12/05/2013 0348   CO2 19 12/05/2013 0348   GLUCOSE 83 12/05/2013 0348   BUN 7 12/05/2013 0348   CREATININE 0.71 12/05/2013 0348   CALCIUM 8.4 12/05/2013 0348   PROT 6.8 12/05/2013 0348   ALBUMIN 3.4* 12/05/2013 0348   AST 38* 12/05/2013 0348   ALT 7 12/05/2013 0348   ALKPHOS 113 12/05/2013 0348   BILITOT 0.5 12/05/2013 0348   GFRNONAA >90 12/05/2013 0348   GFRAA >90 12/05/2013 0348   Assessment:  1.  Hematemesis, resolved, highly likely due to esophagitis. 2.  Severe erosive esophagitis, likely exacerbated by significant interval increase in alcohol intake.  Plan:  1.  Advance diet slowly. 2.  Head of bed elevated at all times, to at least 30 degrees (45 degrees preferable during waking hours). 3.  Transition from IV to PO PPI. 4.  Anticipate discharge home tomorrow with [facility] outpatient follow-up. 5.  Will follow.   Landry Dyke 12/06/2013, 11:44 AM

## 2013-12-07 LAB — GLUCOSE, CAPILLARY: Glucose-Capillary: 128 mg/dL — ABNORMAL HIGH (ref 70–99)

## 2013-12-07 MED ORDER — AMLODIPINE BESYLATE-VALSARTAN 5-160 MG PO TABS: 1.0000 | ORAL_TABLET | Freq: Every day | ORAL | Status: DC

## 2013-12-07 MED ORDER — FOLIC ACID 1 MG PO TABS: 1.0000 mg | ORAL_TABLET | Freq: Every day | ORAL | Status: DC

## 2013-12-07 MED ORDER — NICOTINE 21 MG/24HR TD PT24: 21.0000 mg | MEDICATED_PATCH | Freq: Every day | TRANSDERMAL | Status: DC

## 2013-12-07 MED ORDER — PANTOPRAZOLE SODIUM 40 MG PO TBEC: 40.0000 mg | DELAYED_RELEASE_TABLET | Freq: Two times a day (BID) | ORAL | Status: DC

## 2013-12-07 MED ORDER — THIAMINE HCL 100 MG PO TABS: 100.0000 mg | ORAL_TABLET | Freq: Every day | ORAL | Status: DC

## 2013-12-07 MED ORDER — ADULT MULTIVITAMIN W/MINERALS CH: 1.0000 | ORAL_TABLET | Freq: Every day | ORAL | Status: DC

## 2013-12-07 MED ORDER — ONDANSETRON HCL 4 MG PO TABS
4.0000 mg | ORAL_TABLET | Freq: Once | ORAL | Status: AC
  Administered 2013-12-07: 4 mg via ORAL
  Filled 2013-12-07: qty 1

## 2013-12-07 MED ORDER — ALUM & MAG HYDROXIDE-SIMETH 200-200-20 MG/5ML PO SUSP
30.0000 mL | Freq: Once | ORAL | Status: AC
  Administered 2013-12-07: 30 mL via ORAL
  Filled 2013-12-07: qty 30

## 2013-12-07 NOTE — ED Provider Notes (Signed)
Medical screening examination/treatment/procedure(s) were performed by non-physician practitioner and as supervising physician I was immediately available for consultation/collaboration.   EKG Interpretation   Date/Time:  Monday December 04 2013 10:30:10 EDT Ventricular Rate:  115 PR Interval:  141 QRS Duration: 85 QT Interval:  342 QTC Calculation: 473 R Axis:   15 Text Interpretation:  Sinus tachycardia Abnormal R-wave progression, early  transition Baseline wander in lead(s) V5 ED PHYSICIAN INTERPRETATION  AVAILABLE IN [facility] Confirmed by TEST, Record ([id number]) on 12/06/2013  7:20:02 AM       Virgel Manifold, MD 12/07/13 [phone]

## 2013-12-07 NOTE — Progress Notes (Signed)
Subjective: No further abdominal pain or hematemesis. Tolerating full liquids.  Objective: Vital signs in last 24 hours: Temp:  [98.3 F (36.8 C)-98.6 F (37 C)] 98.3 F (36.8 C) (09/10 0443) Pulse Rate:  [84-101] 101 (09/10 0804) Resp:  [18-20] 18 (09/10 0804) BP: (132-147)/(90-103) 132/96 mmHg (09/10 0804) SpO2:  [97 %-100 %] 97 % (09/10 0804) Weight:  [92.035 kg (202 lb 14.4 oz)] 92.035 kg (202 lb 14.4 oz) (09/10 0443) Weight change: -0.965 kg (-2 lb 2 oz) Last BM Date: 12/06/13  PE: GEN:  NAD ABD:  Soft  Lab Results: CBC    Component Value Date/Time   WBC 11.7* 12/05/2013 0348   RBC 4.53 12/05/2013 0348   HGB 13.5 12/05/2013 0348   HCT 38.7* 12/05/2013 0348   PLT 270 12/05/2013 0348   MCV 85.4 12/05/2013 0348   MCH 29.8 12/05/2013 0348   MCHC 34.9 12/05/2013 0348   RDW 14.8 12/05/2013 0348   LYMPHSABS 2.8 12/04/2013 1400   MONOABS 0.6 12/04/2013 1400   EOSABS 0.0 12/04/2013 1400   BASOSABS 0.0 12/04/2013 1400   CMP     Component Value Date/Time   NA 134* 12/05/2013 0348   K 3.7 12/05/2013 0348   CL 99 12/05/2013 0348   CO2 19 12/05/2013 0348   GLUCOSE 83 12/05/2013 0348   BUN 7 12/05/2013 0348   CREATININE 0.71 12/05/2013 0348   CALCIUM 8.4 12/05/2013 0348   PROT 6.8 12/05/2013 0348   ALBUMIN 3.4* 12/05/2013 0348   AST 38* 12/05/2013 0348   ALT 7 12/05/2013 0348   ALKPHOS 113 12/05/2013 0348   BILITOT 0.5 12/05/2013 0348   GFRNONAA >90 12/05/2013 0348   GFRAA >90 12/05/2013 0348   Assessment:  1.  Hematemesis, resolved, due to erosive esophagitis. 2.  Abdominal pain, likely due to esophagitis, much improved.  Plan:  1.  Protonix 40 mg po bid for the next 6 weeks. 2.  Avoid alcohol and NSAIDs. 3.  Keep HOB elevated 30-45 degrees. 4.  Advancing diet. 5.  OK to be discharged home today; can follow-up with me as outpatient. 6.  Will sign-off; please call with questions.   Landry Dyke 12/07/2013, 9:22 AM

## 2013-12-07 NOTE — Discharge Summary (Signed)
Physician Discharge Summary  [name] MWU:[id number] DOB: 1965/04/17 DOA: 12/04/2013  PCP: No PCP Per Patient  Admit date: 12/04/2013 Discharge date: 12/07/2013  Time spent:35 minutes  Recommendations for Outpatient Follow-up:  1. D/C home. patient following  with a physician at [facility]  ETOH rehab . Will need BP monitored and H&H checked in 1 week. Will provide list of PCP in the community prior to discharge. 2.  follow up with Dr Dulce Sellar in 4 weeks  Discharge Diagnoses:  Principal Problem:   Acute esophagitis   Active Problems:   Alcohol abuse   Hematemesis   Tobacco abuse   Accelerated hypertension   Discharge Condition: fair  Diet recommendation: soft, advance as tolerated  Filed Weights   12/05/13 1030 12/06/13 0547 12/07/13 0443  Weight: 93 kg (205 lb 0.4 oz) 91.491 kg (201 lb 11.2 oz) 92.035 kg (202 lb 14.4 oz)    History of present illness:  Please refer to admission H&P for details, but in brief, 48 year old obese male with history of alcohol abuse presented with acute abdominal pain and hematemesis. Last alcohol intake was on the day of admission. Patient was tachycardic but otherwise vitals stable. wBC of 16.2 with normal hemoglobin , alcohol level of 154 and Lactic acid of 3.5.  Patient started on Protonix drip and admitted to telemetry.   Hospital Course:  Acute upper GI bleed  Secondary to erosive esophagitis seen on EGD on 9/8. No signs of bleeding or ulcer noted..  -patient was placed on PPI drip , now switched po bid.  H&H stable. Advanced diet to soft and can be advanced further at home tomorrow if tolerable.  -Counseled strongly on etoh cessation and avoid NSAIDs. Also counseled on smoking cessation. Patient reports following at [facility] as outpatient.  -will discharge on oral protonix 40 mg bid for 6 weeks and will follow with Dr Cleda Clarks in 4-6 weeks  Alcohol abuse  Counseled on cessation. No signs of withdrawal. Continue thiamine, folate  and multivitamin.   Tobacco abuse  Counseled on cessation. Prescribed  nicotine patch.   Elevated blood pressure  Will d/c outpt BP meds and switch to once a day amlodipine -valsartan 5-160 mg  For better compliance.   Code Status: Full code  Family Communication: None at bedside  Disposition Plan: Home   Consultants:  [facility] ( Dr Dulce Sellar) Procedures:  EGD on 9/8   Antibiotics:  None   Discharge Exam: Filed Vitals:   12/07/13 0804  BP: 132/96  Pulse: 101  Temp:   Resp: 18   General:  no acute distress  HEENT: moist oral mucosa  Cardiovascular: Normal S1 and S2, no murmurs  Chest: Clear to auscultation bilaterally,  Abdomen:, Mild epigastric tenderness, nondistended, bowel sounds present  Musculoskeletal: Warm, no edema  CNS: Alert and oriented, no tremors     Discharge Instructions You were cared for by a hospitalist during your hospital stay. If you have any questions about your discharge medications or the care you received while you were in the hospital after you are discharged, you can call the unit and asked to speak with the hospitalist on call if the hospitalist that took care of you is not available. Once you are discharged, your primary care physician will handle any further medical issues. Please note that NO REFILLS for any discharge medications will be authorized once you are discharged, as it is imperative that you return to your primary care physician (or establish a relationship with a primary care  physician if you do not have one) for your aftercare needs so that they can reassess your need for medications and monitor your lab values.   Current Discharge Medication List    START taking these medications   Details  amLODipine-valsartan (EXFORGE) 5-160 MG per tablet Take 1 tablet by mouth daily. Qty: 30 tablet, Refills: 0    folic acid (FOLVITE) 1 MG tablet Take 1 tablet (1 mg total) by mouth daily. Qty: 30 tablet, Refills: 0    Multiple Vitamin  (MULTIVITAMIN WITH MINERALS) TABS tablet Take 1 tablet by mouth daily. Qty: 30 tablet, Refills: 0    nicotine (NICODERM CQ - DOSED IN MG/24 HOURS) 21 mg/24hr patch Place 1 patch (21 mg total) onto the skin daily. Qty: 28 patch, Refills: 0    pantoprazole (PROTONIX) 40 MG tablet Take 1 tablet (40 mg total) by mouth 2 (two) times daily. Qty: 84 tablet, Refills: 0    thiamine 100 MG tablet Take 1 tablet (100 mg total) by mouth daily. Qty: 30 tablet, Refills: 0      CONTINUE these medications which have NOT CHANGED   Details  albuterol (PROVENTIL HFA;VENTOLIN HFA) 108 (90 BASE) MCG/ACT inhaler Inhale 1 puff into the lungs 2 (two) times daily as needed for wheezing or shortness of breath.    oxyCODONE-acetaminophen (PERCOCET/ROXICET) 5-325 MG per tablet Take 2 tablets by mouth every 4 (four) hours as needed (pain.).      STOP taking these medications     lisinopril (PRINIVIL,ZESTRIL) 10 MG tablet      pindolol (VISKEN) 5 MG tablet      sucralfate (CARAFATE) 1 G tablet        Allergies  Allergen Reactions  . Aspirin Swelling   Follow-up Information   Follow up with Freddy Jaksch, MD. Schedule an appointment as soon as possible for a visit in 4 weeks.   Specialty:  Gastroenterology   Contact information:   [address], [address] [place] [address] [phone]        The results of significant diagnostics from this hospitalization (including imaging, microbiology, ancillary and laboratory) are listed below for reference.    Significant Diagnostic Studies: No results found.  Microbiology: Recent Results (from the past 240 hour(s))  MRSA PCR SCREENING     Status: None   Collection Time    12/04/13  1:27 PM      Result Value Ref Range Status   MRSA by PCR NEGATIVE  NEGATIVE Final   Comment:            The GeneXpert MRSA Assay (FDA     approved for NASAL specimens     only), is one component of a     comprehensive MRSA colonization     surveillance  program. It is not     intended to diagnose MRSA     infection nor to guide or     monitor treatment for     MRSA infections.     Labs: Basic Metabolic Panel:  Recent Labs Lab 12/04/13 1017 12/04/13 1400 12/05/13 0348  NA 136* 137 134*  K 3.9 4.2 3.7  CL 95* 98 99  CO2 19 19 19   GLUCOSE 89 81 83  BUN 6 6 7   CREATININE 0.69 0.71 0.71  CALCIUM 8.3* 8.2* 8.4  MG 1.9  --   --   PHOS 4.0  --   --    Liver Function Tests:  Recent Labs Lab 12/04/13 1017 12/04/13 1400 12/05/13  0348  AST 24 24 38*  ALT 7 6 7   ALKPHOS 132* 123* 113  BILITOT 0.2* 0.3 0.5  PROT 7.5 6.9 6.8  ALBUMIN 3.6 3.4* 3.4*    Recent Labs Lab 12/04/13 1017  LIPASE 7*   No results found for this basename: AMMONIA,  in the last 168 hours CBC:  Recent Labs Lab 12/04/13 1017 12/04/13 1400 12/05/13 0348  WBC 16.2* 14.2* 11.7*  NEUTROABS 13.4* 10.7*  --   HGB 14.0 13.0 13.5  HCT 40.3 37.7* 38.7*  MCV 85.0 84.9 85.4  PLT 299 275 270   Cardiac Enzymes: No results found for this basename: CKTOTAL, CKMB, CKMBINDEX, TROPONINI,  in the last 168 hours BNP: BNP (last 3 results) No results found for this basename: PROBNP,  in the last 8760 hours CBG:  Recent Labs Lab 12/06/13 0737 12/07/13 0731  GLUCAP 92 128*       Signed:  Jerami Tammen  [facility] 12/07/2013, 10:29 AM

## 2013-12-07 NOTE — Discharge Instructions (Signed)
Alcohol Use Disorder Alcohol use disorder is a mental disorder. It is not a one-time incident of heavy drinking. Alcohol use disorder is the excessive and uncontrollable use of alcohol over time that leads to problems with functioning in one or more areas of daily living. People with this disorder risk harming themselves and others when they drink to excess. Alcohol use disorder also can cause other mental disorders, such as mood and anxiety disorders, and serious physical problems. People with alcohol use disorder often misuse other drugs.  Alcohol use disorder is common and widespread. Some people with this disorder drink alcohol to cope with or escape from negative life events. Others drink to relieve chronic pain or symptoms of mental illness. People with a family history of alcohol use disorder are at higher risk of losing control and using alcohol to excess.  SYMPTOMS  Signs and symptoms of alcohol use disorder may include the following:   Consumption ofalcohol inlarger amounts or over a longer period of time than intended.  Multiple unsuccessful attempts to cutdown or control alcohol use.   A great deal of time spent obtaining alcohol, using alcohol, or recovering from the effects of alcohol (hangover).  A strong desire or urge to use alcohol (cravings).   Continued use of alcohol despite problems at work, school, or home because of alcohol use.   Continued use of alcohol despite problems in relationships because of alcohol use.  Continued use of alcohol in situations when it is physically hazardous, such as driving a car.  Continued use of alcohol despite awareness of a physical or psychological problem that is likely related to alcohol use. Physical problems related to alcohol use can involve the brain, heart, liver, stomach, and intestines. Psychological problems related to alcohol use include intoxication, depression, anxiety, psychosis, delirium, and dementia.   The need for  increased amounts of alcohol to achieve the same desired effect, or a decreased effect from the consumption of the same amount of alcohol (tolerance).  Withdrawal symptoms upon reducing or stopping alcohol use, or alcohol use to reduce or avoid withdrawal symptoms. Withdrawal symptoms include:  Racing heart.  Hand tremor.  Difficulty sleeping.  Nausea.  Vomiting.  Hallucinations.  Restlessness.  Seizures. DIAGNOSIS Alcohol use disorder is diagnosed through an assessment by your health care provider. Your health care provider may start by asking three or four questions to screen for excessive or problematic alcohol use. To confirm a diagnosis of alcohol use disorder, at least two symptoms must be present within a 12-month period. The severity of alcohol use disorder depends on the number of symptoms:  Mild--two or three.  Moderate--four or five.  Severe--six or more. Your health care provider may perform a physical exam or use results from lab tests to see if you have physical problems resulting from alcohol use. Your health care provider may refer you to a mental health professional for evaluation. TREATMENT  Some people with alcohol use disorder are able to reduce their alcohol use to low-risk levels. Some people with alcohol use disorder need to quit drinking alcohol. When necessary, mental health professionals with specialized training in substance use treatment can help. Your health care provider can help you decide how severe your alcohol use disorder is and what type of treatment you need. The following forms of treatment are available:   Detoxification. Detoxification involves the use of prescription medicines to prevent alcohol withdrawal symptoms in the first week after quitting. This is important for people with a history of symptoms   of withdrawal and for heavy drinkers who are likely to have withdrawal symptoms. Alcohol withdrawal can be dangerous and, in severe cases, cause  death. Detoxification is usually provided in a hospital or in-patient substance use treatment facility.  Counseling or talk therapy. Talk therapy is provided by substance use treatment counselors. It addresses the reasons people use alcohol and ways to keep them from drinking again. The goals of talk therapy are to help people with alcohol use disorder find healthy activities and ways to cope with life stress, to identify and avoid triggers for alcohol use, and to handle cravings, which can cause relapse.  Medicines.Different medicines can help treat alcohol use disorder through the following actions:  Decrease alcohol cravings.  Decrease the positive reward response felt from alcohol use.  Produce an uncomfortable physical reaction when alcohol is used (aversion therapy).  Support groups. Support groups are run by people who have quit drinking. They provide emotional support, advice, and guidance. These forms of treatment are often combined. Some people with alcohol use disorder benefit from intensive combination treatment provided by specialized substance use treatment centers. Both inpatient and outpatient treatment programs are available. Document Released: 04/23/2004 Document Revised: 07/31/2013 Document Reviewed: 06/23/2012 Exit[employer] Patient Information 2015 [employer]. This information is not intended to replace advice given to you by your health care provider. Make sure you discuss any questions you have with your health care provider.  

## 2014-01-12 DIAGNOSIS — F200 Paranoid schizophrenia: Secondary | ICD-10-CM | POA: Insufficient documentation

## 2014-01-12 DIAGNOSIS — Z8673 Personal history of transient ischemic attack (TIA), and cerebral infarction without residual deficits: Secondary | ICD-10-CM | POA: Diagnosis not present

## 2014-01-12 DIAGNOSIS — M25572 Pain in left ankle and joints of left foot: Secondary | ICD-10-CM | POA: Diagnosis not present

## 2014-01-12 DIAGNOSIS — I10 Essential (primary) hypertension: Secondary | ICD-10-CM | POA: Diagnosis not present

## 2014-01-12 DIAGNOSIS — K209 Esophagitis, unspecified: Secondary | ICD-10-CM | POA: Diagnosis not present

## 2014-01-12 DIAGNOSIS — G8929 Other chronic pain: Secondary | ICD-10-CM | POA: Insufficient documentation

## 2014-01-12 DIAGNOSIS — F209 Schizophrenia, unspecified: Secondary | ICD-10-CM | POA: Insufficient documentation

## 2014-01-29 DIAGNOSIS — M25572 Pain in left ankle and joints of left foot: Secondary | ICD-10-CM | POA: Diagnosis not present

## 2014-01-29 DIAGNOSIS — H6123 Impacted cerumen, bilateral: Secondary | ICD-10-CM | POA: Diagnosis not present

## 2014-01-29 DIAGNOSIS — G8929 Other chronic pain: Secondary | ICD-10-CM | POA: Diagnosis not present

## 2014-02-28 ENCOUNTER — Encounter (HOSPITAL_COMMUNITY): Payer: Self-pay | Admitting: Emergency Medicine

## 2014-02-28 ENCOUNTER — Emergency Department (HOSPITAL_COMMUNITY)
Admission: EM | Admit: 2014-02-28 | Discharge: 2014-03-01 | Disposition: A | Discharge status: Other Acute Inpatient Hospital | Payer: Medicare Other | Source: Home / Self Care | Attending: Emergency Medicine | Admitting: Emergency Medicine

## 2014-02-28 DIAGNOSIS — R45851 Suicidal ideations: Secondary | ICD-10-CM

## 2014-02-28 DIAGNOSIS — F1094 Alcohol use, unspecified with alcohol-induced mood disorder: Secondary | ICD-10-CM

## 2014-02-28 DIAGNOSIS — R45850 Homicidal ideations: Secondary | ICD-10-CM | POA: Diagnosis not present

## 2014-02-28 DIAGNOSIS — F331 Major depressive disorder, recurrent, moderate: Secondary | ICD-10-CM | POA: Diagnosis not present

## 2014-02-28 DIAGNOSIS — Z8659 Personal history of other mental and behavioral disorders: Secondary | ICD-10-CM

## 2014-02-28 LAB — CBC
HCT: 43.5 % (ref 39.0–52.0)
HEMOGLOBIN: 14.7 g/dL (ref 13.0–17.0)
MCH: 29.9 pg (ref 26.0–34.0)
MCHC: 33.8 g/dL (ref 30.0–36.0)
MCV: 88.6 fL (ref 78.0–100.0)
PLATELETS: 349 10*3/uL (ref 150–400)
RBC: 4.91 MIL/uL (ref 4.22–5.81)
RDW: 16.7 % — ABNORMAL HIGH (ref 11.5–15.5)
WBC: 10 10*3/uL (ref 4.0–10.5)

## 2014-02-28 LAB — COMPREHENSIVE METABOLIC PANEL
ALBUMIN: 4 g/dL (ref 3.5–5.2)
ALK PHOS: 140 U/L — AB (ref 39–117)
ALT: 7 U/L (ref 0–53)
AST: 27 U/L (ref 0–37)
Anion gap: 18 — ABNORMAL HIGH (ref 5–15)
BUN: 10 mg/dL (ref 6–23)
CALCIUM: 8.6 mg/dL (ref 8.4–10.5)
CO2: 19 mEq/L (ref 19–32)
Chloride: 103 mEq/L (ref 96–112)
Creatinine, Ser: 0.81 mg/dL (ref 0.50–1.35)
GFR calc Af Amer: >90 mL/min (ref >90)
GFR calc non Af Amer: >90 mL/min (ref >90)
Glucose, Bld: 146 mg/dL — ABNORMAL HIGH (ref 70–99)
POTASSIUM: 3.9 meq/L (ref 3.7–5.3)
Sodium: 140 mEq/L (ref 137–147)
TOTAL PROTEIN: 8.2 g/dL (ref 6.0–8.3)
Total Bilirubin: <0.2 mg/dL — ABNORMAL LOW (ref 0.3–1.2)

## 2014-02-28 LAB — ETHANOL: Alcohol, Ethyl (B): 275 mg/dL — ABNORMAL HIGH (ref 0–11)

## 2014-02-28 LAB — ACETAMINOPHEN LEVEL

## 2014-02-28 LAB — SALICYLATE LEVEL

## 2014-02-28 MED ORDER — ONDANSETRON HCL 4 MG PO TABS
4.0000 mg | ORAL_TABLET | Freq: Three times a day (TID) | ORAL | Status: DC | PRN
Start: 2014-02-28 — End: 2014-03-01

## 2014-02-28 MED ORDER — ACETAMINOPHEN 325 MG PO TABS: 650.0000 mg | ORAL_TABLET | ORAL | Status: DC | PRN

## 2014-02-28 MED ORDER — ZOLPIDEM TARTRATE 5 MG PO TABS: 5.0000 mg | ORAL_TABLET | Freq: Every evening | ORAL | Status: DC | PRN

## 2014-02-28 MED ORDER — ALUM & MAG HYDROXIDE-SIMETH 200-200-20 MG/5ML PO SUSP: 30.0000 mL | ORAL | Status: DC | PRN

## 2014-02-28 MED ORDER — LORAZEPAM 1 MG PO TABS
0.0000 mg | ORAL_TABLET | Freq: Four times a day (QID) | ORAL | Status: DC
  Administered 2014-03-01: 1 mg via ORAL
  Filled 2014-02-28: qty 1

## 2014-02-28 MED ORDER — LORAZEPAM 1 MG PO TABS: 0.0000 mg | ORAL_TABLET | Freq: Two times a day (BID) | ORAL | Status: DC

## 2014-02-28 MED ORDER — NICOTINE 21 MG/24HR TD PT24: 21.0000 mg | MEDICATED_PATCH | Freq: Every day | TRANSDERMAL | Status: DC

## 2014-02-28 MED ORDER — LORAZEPAM 1 MG PO TABS: 1.0000 mg | ORAL_TABLET | Freq: Three times a day (TID) | ORAL | Status: DC | PRN

## 2014-02-28 NOTE — ED Notes (Signed)
Bed: [facility] Expected date:  Expected time:  Means of arrival:  Comments: [facility] [id number]

## 2014-02-28 NOTE — ED Notes (Signed)
Patient appears mildly confused. States he has AVH. Denies HI. He is positive for SI stating "if I had a gun I would use it right now". States that he is not anxious and not normally depressed. Patient states that he has had recent falls that he believes are related to medications dropping his blood pressure too low.   Encouragement offered. Oriented to the unit.  Q 15 safety checks in place.
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
## 2014-02-28 NOTE — ED Notes (Signed)
Pt wanded by security. 

## 2014-02-28 NOTE — BH Assessment (Addendum)
Spoke with Brian General, PA-C, BIB police under IVX, hx of schizophrenia and had become disoriented. He had sword on his lap when EMS was present, feels out of his mind, and put gun in his mouth earlier tonight.   Etoh was 275 and is a daily drinker, but alert for assessment. UDS is pending, but pt is otherwise medically cleared.   Assessment to commence shortly.    Unable to assess pt at this time, he is very drowsy and will not respond. BAL is 275. Pt remained asleep 0108 and 0533, will attempt to assess at 0600 when awaken for vitals.   Lear Ng, [facility] Triage Specialist 02/28/2014 10:18 PM

## 2014-02-28 NOTE — ED Notes (Signed)
Pt BIB [employer] IVC: "Respondent has been diagnosed schizophrenic and has become very disoriented and has failed to care for him both medically and mental issues respondent was picked up by EMS and at the time he placed a sword across his lap but made no verbal threats and continues to use alcohol and refuses medical aid and become a danger to himself and others.

## 2014-03-01 ENCOUNTER — Encounter (HOSPITAL_COMMUNITY): Payer: Self-pay | Admitting: Registered Nurse

## 2014-03-01 ENCOUNTER — Inpatient Hospital Stay (HOSPITAL_COMMUNITY)
Admission: AD | Admit: 2014-03-01 | Discharge: 2014-03-05 | DRG: 885 | Disposition: A | Discharge status: Home / Self Care | Payer: Medicare Other | Source: Intra-hospital | Attending: Psychiatry | Admitting: Psychiatry

## 2014-03-01 ENCOUNTER — Encounter (HOSPITAL_COMMUNITY): Payer: Self-pay | Admitting: *Deleted

## 2014-03-01 DIAGNOSIS — G4700 Insomnia, unspecified: Secondary | ICD-10-CM | POA: Diagnosis not present

## 2014-03-01 DIAGNOSIS — F1410 Cocaine abuse, uncomplicated: Secondary | ICD-10-CM | POA: Diagnosis present

## 2014-03-01 DIAGNOSIS — F1020 Alcohol dependence, uncomplicated: Secondary | ICD-10-CM | POA: Diagnosis present

## 2014-03-01 DIAGNOSIS — Z23 Encounter for immunization: Secondary | ICD-10-CM

## 2014-03-01 DIAGNOSIS — F17210 Nicotine dependence, cigarettes, uncomplicated: Secondary | ICD-10-CM | POA: Diagnosis present

## 2014-03-01 DIAGNOSIS — Z8659 Personal history of other mental and behavioral disorders: Secondary | ICD-10-CM

## 2014-03-01 DIAGNOSIS — I10 Essential (primary) hypertension: Secondary | ICD-10-CM | POA: Diagnosis present

## 2014-03-01 DIAGNOSIS — K210 Gastro-esophageal reflux disease with esophagitis: Secondary | ICD-10-CM | POA: Diagnosis not present

## 2014-03-01 DIAGNOSIS — F4310 Post-traumatic stress disorder, unspecified: Secondary | ICD-10-CM | POA: Diagnosis present

## 2014-03-01 DIAGNOSIS — E119 Type 2 diabetes mellitus without complications: Secondary | ICD-10-CM | POA: Diagnosis not present

## 2014-03-01 DIAGNOSIS — J45909 Unspecified asthma, uncomplicated: Secondary | ICD-10-CM | POA: Diagnosis present

## 2014-03-01 DIAGNOSIS — F1094 Alcohol use, unspecified with alcohol-induced mood disorder: Secondary | ICD-10-CM | POA: Insufficient documentation

## 2014-03-01 DIAGNOSIS — F331 Major depressive disorder, recurrent, moderate: Principal | ICD-10-CM | POA: Diagnosis present

## 2014-03-01 DIAGNOSIS — Z8673 Personal history of transient ischemic attack (TIA), and cerebral infarction without residual deficits: Secondary | ICD-10-CM | POA: Diagnosis not present

## 2014-03-01 DIAGNOSIS — F10229 Alcohol dependence with intoxication, unspecified: Secondary | ICD-10-CM | POA: Insufficient documentation

## 2014-03-01 DIAGNOSIS — Y908 Blood alcohol level of 240 mg/100 ml or more: Secondary | ICD-10-CM | POA: Diagnosis not present

## 2014-03-01 LAB — RAPID URINE DRUG SCREEN, HOSP PERFORMED
Amphetamines: NOT DETECTED
BENZODIAZEPINES: NOT DETECTED
Barbiturates: NOT DETECTED
COCAINE: POSITIVE — AB
Opiates: NOT DETECTED
TETRAHYDROCANNABINOL: NOT DETECTED

## 2014-03-01 MED ORDER — CHLORDIAZEPOXIDE HCL 25 MG PO CAPS
25.0000 mg | ORAL_CAPSULE | Freq: Three times a day (TID) | ORAL | Status: AC
  Administered 2014-03-03 (×3): 25 mg via ORAL
  Filled 2014-03-01 (×2): qty 1

## 2014-03-01 MED ORDER — ACAMPROSATE CALCIUM 333 MG PO TBEC
666.0000 mg | DELAYED_RELEASE_TABLET | Freq: Three times a day (TID) | ORAL | Status: DC
  Administered 2014-03-02 – 2014-03-05 (×10): 666 mg via ORAL
  Filled 2014-03-01 (×6): qty 2
  Filled 2014-03-01: qty 18
  Filled 2014-03-01: qty 2
  Filled 2014-03-01: qty 18
  Filled 2014-03-01: qty 2
  Filled 2014-03-01: qty 18
  Filled 2014-03-01 (×5): qty 2

## 2014-03-01 MED ORDER — BENZTROPINE MESYLATE 1 MG PO TABS
1.0000 mg | ORAL_TABLET | Freq: Every day | ORAL | Status: DC
  Administered 2014-03-02 – 2014-03-05 (×4): 1 mg via ORAL
  Filled 2014-03-01 (×3): qty 1
  Filled 2014-03-01: qty 3
  Filled 2014-03-01 (×2): qty 1

## 2014-03-01 MED ORDER — OLANZAPINE 5 MG PO TABS
15.0000 mg | ORAL_TABLET | Freq: Every day | ORAL | Status: DC
  Administered 2014-03-01 – 2014-03-03 (×3): 15 mg via ORAL
  Filled 2014-03-01 (×5): qty 3

## 2014-03-01 MED ORDER — PINDOLOL 5 MG PO TABS
5.0000 mg | ORAL_TABLET | Freq: Two times a day (BID) | ORAL | Status: DC
  Administered 2014-03-01 – 2014-03-05 (×8): 5 mg via ORAL
  Filled 2014-03-01 (×15): qty 1

## 2014-03-01 MED ORDER — ALBUTEROL SULFATE HFA 108 (90 BASE) MCG/ACT IN AERS: 1.0000 | INHALATION_SPRAY | Freq: Two times a day (BID) | RESPIRATORY_TRACT | Status: DC | PRN

## 2014-03-01 MED ORDER — OLANZAPINE 5 MG PO TABS: 15.0000 mg | ORAL_TABLET | Freq: Every day | ORAL | Status: DC

## 2014-03-01 MED ORDER — THIAMINE HCL 100 MG/ML IJ SOLN: 100.0000 mg | Freq: Once | INTRAMUSCULAR | Status: DC

## 2014-03-01 MED ORDER — SUCRALFATE 1 G PO TABS
1.0000 g | ORAL_TABLET | Freq: Four times a day (QID) | ORAL | Status: DC
  Administered 2014-03-02 – 2014-03-05 (×14): 1 g via ORAL
  Filled 2014-03-01 (×22): qty 1

## 2014-03-01 MED ORDER — OLANZAPINE 7.5 MG PO TABS: 15.0000 mg | ORAL_TABLET | Freq: Every day | ORAL | Status: DC

## 2014-03-01 MED ORDER — AMLODIPINE BESYLATE 5 MG PO TABS
5.0000 mg | ORAL_TABLET | Freq: Every day | ORAL | Status: DC
  Administered 2014-03-01: 5 mg via ORAL
  Filled 2014-03-01: qty 1

## 2014-03-01 MED ORDER — LOPERAMIDE HCL 2 MG PO CAPS: 2.0000 mg | ORAL_CAPSULE | ORAL | Status: DC | PRN

## 2014-03-01 MED ORDER — CHLORDIAZEPOXIDE HCL 25 MG PO CAPS: 25.0000 mg | ORAL_CAPSULE | ORAL | Status: DC

## 2014-03-01 MED ORDER — OLANZAPINE 5 MG PO TABS
ORAL_TABLET | ORAL | Status: AC
  Filled 2014-03-01: qty 1

## 2014-03-01 MED ORDER — PANTOPRAZOLE SODIUM 40 MG PO TBEC: 40.0000 mg | DELAYED_RELEASE_TABLET | Freq: Two times a day (BID) | ORAL | Status: DC

## 2014-03-01 MED ORDER — CHLORDIAZEPOXIDE HCL 25 MG PO CAPS: 25.0000 mg | ORAL_CAPSULE | Freq: Four times a day (QID) | ORAL | Status: AC | PRN

## 2014-03-01 MED ORDER — CHLORDIAZEPOXIDE HCL 25 MG PO CAPS: 25.0000 mg | ORAL_CAPSULE | Freq: Four times a day (QID) | ORAL | Status: DC | PRN

## 2014-03-01 MED ORDER — HYDROXYZINE HCL 25 MG PO TABS: 25.0000 mg | ORAL_TABLET | Freq: Four times a day (QID) | ORAL | Status: AC | PRN

## 2014-03-01 MED ORDER — CHLORDIAZEPOXIDE HCL 25 MG PO CAPS
ORAL_CAPSULE | ORAL | Status: AC
  Filled 2014-03-01: qty 1

## 2014-03-01 MED ORDER — FOLIC ACID 1 MG PO TABS
1.0000 mg | ORAL_TABLET | Freq: Every day | ORAL | Status: DC
  Administered 2014-03-01: 1 mg via ORAL
  Filled 2014-03-01: qty 1

## 2014-03-01 MED ORDER — RISPERIDONE 2 MG PO TABS
4.0000 mg | ORAL_TABLET | Freq: Two times a day (BID) | ORAL | Status: DC
  Administered 2014-03-02 – 2014-03-05 (×7): 4 mg via ORAL
  Filled 2014-03-01 (×4): qty 2
  Filled 2014-03-01: qty 12
  Filled 2014-03-01 (×5): qty 2
  Filled 2014-03-01: qty 12
  Filled 2014-03-01: qty 2

## 2014-03-01 MED ORDER — CHLORDIAZEPOXIDE HCL 25 MG PO CAPS
25.0000 mg | ORAL_CAPSULE | ORAL | Status: AC
  Administered 2014-03-04 – 2014-03-05 (×2): 25 mg via ORAL
  Filled 2014-03-01 (×2): qty 1

## 2014-03-01 MED ORDER — ALBUTEROL SULFATE HFA 108 (90 BASE) MCG/ACT IN AERS
1.0000 | INHALATION_SPRAY | Freq: Two times a day (BID) | RESPIRATORY_TRACT | Status: DC | PRN
  Administered 2014-03-04 – 2014-03-05 (×2): 1 via RESPIRATORY_TRACT
  Filled 2014-03-01: qty 6.7

## 2014-03-01 MED ORDER — AMLODIPINE BESYLATE-VALSARTAN 5-160 MG PO TABS: 1.0000 | ORAL_TABLET | Freq: Every day | ORAL | Status: DC

## 2014-03-01 MED ORDER — RISPERIDONE 2 MG PO TABS: 4.0000 mg | ORAL_TABLET | Freq: Two times a day (BID) | ORAL | Status: DC

## 2014-03-01 MED ORDER — PANTOPRAZOLE SODIUM 40 MG PO TBEC
40.0000 mg | DELAYED_RELEASE_TABLET | Freq: Two times a day (BID) | ORAL | Status: DC
  Administered 2014-03-01 – 2014-03-05 (×8): 40 mg via ORAL
  Filled 2014-03-01 (×12): qty 1

## 2014-03-01 MED ORDER — CHLORDIAZEPOXIDE HCL 25 MG PO CAPS: 25.0000 mg | ORAL_CAPSULE | Freq: Every day | ORAL | Status: DC

## 2014-03-01 MED ORDER — IRBESARTAN 150 MG PO TABS
150.0000 mg | ORAL_TABLET | Freq: Every day | ORAL | Status: DC
  Administered 2014-03-02 – 2014-03-05 (×4): 150 mg via ORAL
  Filled 2014-03-01 (×6): qty 1

## 2014-03-01 MED ORDER — NICOTINE 21 MG/24HR TD PT24
21.0000 mg | MEDICATED_PATCH | Freq: Every day | TRANSDERMAL | Status: DC
  Administered 2014-03-02 – 2014-03-05 (×4): 21 mg via TRANSDERMAL
  Filled 2014-03-01 (×6): qty 1

## 2014-03-01 MED ORDER — ADULT MULTIVITAMIN W/MINERALS CH: 1.0000 | ORAL_TABLET | Freq: Every day | ORAL | Status: DC

## 2014-03-01 MED ORDER — SUCRALFATE 1 G PO TABS
1.0000 g | ORAL_TABLET | Freq: Four times a day (QID) | ORAL | Status: DC
  Administered 2014-03-01: 1 g via ORAL
  Filled 2014-03-01 (×2): qty 1

## 2014-03-01 MED ORDER — CHLORDIAZEPOXIDE HCL 25 MG PO CAPS
25.0000 mg | ORAL_CAPSULE | Freq: Four times a day (QID) | ORAL | Status: DC
  Administered 2014-03-01: 25 mg via ORAL
  Filled 2014-03-01: qty 1

## 2014-03-01 MED ORDER — ALUM & MAG HYDROXIDE-SIMETH 200-200-20 MG/5ML PO SUSP: 30.0000 mL | ORAL | Status: DC | PRN

## 2014-03-01 MED ORDER — OLANZAPINE 10 MG PO TABS
ORAL_TABLET | ORAL | Status: AC
  Filled 2014-03-01: qty 1

## 2014-03-01 MED ORDER — FOLIC ACID 1 MG PO TABS
1.0000 mg | ORAL_TABLET | Freq: Every day | ORAL | Status: DC
  Administered 2014-03-02 – 2014-03-05 (×4): 1 mg via ORAL
  Filled 2014-03-01 (×6): qty 1

## 2014-03-01 MED ORDER — THIAMINE HCL 100 MG/ML IJ SOLN
100.0000 mg | Freq: Once | INTRAMUSCULAR | Status: DC
  Administered 2014-03-01: 100 mg via INTRAMUSCULAR
  Filled 2014-03-01: qty 2

## 2014-03-01 MED ORDER — ADULT MULTIVITAMIN W/MINERALS CH
1.0000 | ORAL_TABLET | Freq: Every day | ORAL | Status: DC
Start: 2014-03-02 — End: 2014-03-05
  Administered 2014-03-02 – 2014-03-05 (×4): 1 via ORAL
  Filled 2014-03-01 (×6): qty 1

## 2014-03-01 MED ORDER — CHLORDIAZEPOXIDE HCL 25 MG PO CAPS: 25.0000 mg | ORAL_CAPSULE | Freq: Three times a day (TID) | ORAL | Status: DC

## 2014-03-01 MED ORDER — PANTOPRAZOLE SODIUM 20 MG PO TBEC
DELAYED_RELEASE_TABLET | ORAL | Status: AC
  Filled 2014-03-01: qty 1

## 2014-03-01 MED ORDER — HYDROXYZINE HCL 25 MG PO TABS: 25.0000 mg | ORAL_TABLET | Freq: Four times a day (QID) | ORAL | Status: DC | PRN

## 2014-03-01 MED ORDER — BENZTROPINE MESYLATE 1 MG PO TABS
1.0000 mg | ORAL_TABLET | Freq: Every day | ORAL | Status: DC
  Administered 2014-03-01: 1 mg via ORAL
  Filled 2014-03-01: qty 1

## 2014-03-01 MED ORDER — PINDOLOL 5 MG PO TABS
5.0000 mg | ORAL_TABLET | Freq: Two times a day (BID) | ORAL | Status: DC
  Filled 2014-03-01: qty 1

## 2014-03-01 MED ORDER — ONDANSETRON HCL 4 MG PO TABS: 4.0000 mg | ORAL_TABLET | Freq: Three times a day (TID) | ORAL | Status: DC | PRN

## 2014-03-01 MED ORDER — VITAMIN B-1 100 MG PO TABS
100.0000 mg | ORAL_TABLET | Freq: Every day | ORAL | Status: DC
  Administered 2014-03-02 – 2014-03-05 (×4): 100 mg via ORAL
  Filled 2014-03-01 (×6): qty 1

## 2014-03-01 MED ORDER — ACAMPROSATE CALCIUM 333 MG PO TBEC
666.0000 mg | DELAYED_RELEASE_TABLET | Freq: Three times a day (TID) | ORAL | Status: DC
  Administered 2014-03-01: 666 mg via ORAL
  Filled 2014-03-01 (×2): qty 2

## 2014-03-01 MED ORDER — CHLORDIAZEPOXIDE HCL 25 MG PO CAPS
25.0000 mg | ORAL_CAPSULE | Freq: Four times a day (QID) | ORAL | Status: AC
  Administered 2014-03-01 – 2014-03-02 (×5): 25 mg via ORAL

## 2014-03-01 MED ORDER — MAGNESIUM HYDROXIDE 400 MG/5ML PO SUSP: 30.0000 mL | Freq: Every day | ORAL | Status: DC | PRN

## 2014-03-01 MED ORDER — LOPERAMIDE HCL 2 MG PO CAPS: 2.0000 mg | ORAL_CAPSULE | ORAL | Status: AC | PRN

## 2014-03-01 MED ORDER — VITAMIN B-1 100 MG PO TABS: 100.0000 mg | ORAL_TABLET | Freq: Every day | ORAL | Status: DC

## 2014-03-01 MED ORDER — IRBESARTAN 150 MG PO TABS
150.0000 mg | ORAL_TABLET | Freq: Every day | ORAL | Status: DC
Start: 2014-03-01 — End: 2014-03-01
  Administered 2014-03-01: 150 mg via ORAL
  Filled 2014-03-01: qty 1

## 2014-03-01 MED ORDER — AMLODIPINE BESYLATE 5 MG PO TABS
5.0000 mg | ORAL_TABLET | Freq: Every day | ORAL | Status: DC
  Administered 2014-03-02 – 2014-03-05 (×4): 5 mg via ORAL
  Filled 2014-03-01 (×6): qty 1

## 2014-03-01 NOTE — Progress Notes (Signed)
  CARE MANAGEMENT ED NOTE 03/01/2014  Patient:  [name], [name]   Account Number:  [id number]  Date Initiated:  03/01/2014  Documentation initiated by:  Livia Snellen  Subjective/Objective Assessment:   Patient presents to ED, IVC'd, auditory and visual hallucinations, SI, HI     Subjective/Objective Assessment Detail:   Patient with HTN, alcoholism, Stroke, Schizophrenic disorder.  Armed forces training and education officer     Action/Plan:   TTS consult   Action/Plan Detail:   Anticipated DC Date:       Status Recommendation to Physician:   Result of Recommendation:    Other ED [place]  Other  PCP issues    Choice offered to / List presented to:            Status of service:  Completed, signed off  ED Comments:   ED Comments Detail:  EDCM spoke to patient at bedside.  Patient confirms he has Medicare and Medicaid insurnace.  Patient reports he is followed by an ACT team member. Patient reports his pcp is Dr. Luciana Axe through the ACT team.  Patient reports Dr. Luciana Axe is his medical doctor.  Patient reports he does not have any difficulty affording his medications.  No further EDCM needs at this time.

## 2014-03-01 NOTE — ED Notes (Signed)
Patient has been quiet and in his room most of the shift.  Denies any hallucinations or suicidal thoughts.  Given lorazepam 1 mg at noon for CIWA of 6.  Appetite has been good and he is taking and tolerating his medications.

## 2014-03-01 NOTE — ED Notes (Signed)
Pt resting at present, calm & cooperative, no distress noted.  Report called to RN Katharine Look, [facility],  [employer] transport requested.

## 2014-03-01 NOTE — BH Assessment (Addendum)
[facility] Assessment Progress Note  Per Letitia Libra, RN, [facility] pt has been assigned to Rm 306-2.  Pt's nurse, Dawnaly, has been notified.  Jalene Mullet, MA Triage Specialist 03/01/2014 @ 14:27

## 2014-03-01 NOTE — BH Assessment (Signed)
Attempted for the 4th time to assess pt. Initially he was answering questions and then just stopped responding and started to snore loudly. Pt asked where he was and reported he did no know how he ended up in ED.   Relayed results of assessment to Patriciaann Clan, [place]. Per Frederico Hamman, [place] pt will need to be seen by psychiatry in AM to uphold or rescind IVC, and to determine final disposition.   Spoke with Brent General PA-C, the ED provider who reports pt was making lots of comments last night about being trained to kill people and thinking about that all of the time, and was not able to contract for safety.   Informed [facility] representative of plan.   Encouraged pt to fully participate in assessment later in AM.   Lear Ng, [facility] Triage Specialist 03/01/2014 6:22 AM

## 2014-03-01 NOTE — BH Assessment (Addendum)
[place] Assessment Progress Note  Pt has been accepted to [facility] by Corena Pilgrim, MD.  Per Waldon Merl, TS, a bed on the 300 hall will be available later today, but it has not yet been vacated.  Pt signed Consent to Release Information, to his fiancee and to his ACT Team provider at [facility].  IVC paperwork has been faxed to [facility].  Pt's nurse, Dawnaly, has been notified of admission.  She agrees to send original paperwork with pt via [employer], and to call report to [phone] when bed becomes available.  Jalene Mullet, MA Triage Specialist 03/01/2014 @ 12:37
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
## 2014-03-01 NOTE — Consult Note (Signed)
[facility] Face-to-Face Psychiatry Consult   Reason for Consult:  Suicidal ideation Referring Physician:  EDP  [name] is an 48 y.o. male. Total Time spent with patient: 45 minutes  Assessment: AXIS I:  Alcohol Abuse and Substance Induced Mood Disorder AXIS II:  Deferred AXIS III:   Past Medical History  Diagnosis Date  . Hypertension   . Alcoholism     pt is also bi polar  . Stroke 05/15/2010    pt states stroke in 2011  . Schizophrenic disorder    AXIS IV:  other psychosocial or environmental problems AXIS V:  11-20 some danger of hurting self or others possible OR occasionally fails to maintain minimal personal hygiene OR gross impairment in communication  Plan:  Recommend psychiatric Inpatient admission when medically cleared.  Subjective:   [name] is a 49 y.o. male patient presented to [facility] intoxicated and under IVC after suicidal ideation to stab self and sitting with sword in lap.  HPI:  Patient states "I got drunk last night and told them I would hurt myself.  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  Patient states that he received a section 8 dishonorable discharge "They said I tried to blow something up."  At this time patient denies suicidal/homicidal ideation, psychosis, and paranoia.   Patient states that he has an ACT team with [facility].  Patient states that he is compliant with medications.  HPI Elements:   Location:  Suicidal ideation. Quality:  Found with knife in lap. Severity:  Found with knife in lap. Timing:  1 day.   Review of Systems  Psychiatric/Behavioral: Positive for depression, suicidal ideas (Denies) and substance abuse. Negative for memory loss. Hallucinations: History of auditory hallucinations.   History reviewed. No pertinent family history.  Past  Psychiatric History: Past Medical History  Diagnosis Date  . Hypertension   . Alcoholism     pt is also bi polar  . Stroke 05/15/2010    pt states stroke in 2011  . Schizophrenic disorder     reports that he has been smoking Cigarettes.  He has a 19.5 pack-year smoking history. He has never used smokeless tobacco. He reports that he drinks alcohol. He reports that he does not use illicit drugs. History reviewed. No pertinent family history. Family History Substance Abuse:  (UTA) Family Supports: No Living Arrangements: Alone Can pt return to current living arrangement?: Yes Abuse/Neglect [facility]) Physical Abuse: Denies Verbal Abuse: Denies Sexual Abuse: Denies Allergies:   Allergies  Allergen Reactions  . Aspirin Swelling    ACT Assessment Complete:  Yes:    Educational Status    Risk to Self: Risk to self with the past 6 months Suicidal Ideation: No-Not Currently/Within Last 6 Months Suicidal Intent: No-Not Currently/Within Last 6 Months Is patient at risk for suicide?: Yes Suicidal Plan?: Yes-Currently Present Specify Current Suicidal Plan: pt denies SI at time of assessent, earlier in evening reports thoughts of stabbing himself through the heart, and EMS found him with sword on his lap  Access to Means: Yes Specify Access to Suicidal Means: sword What has been your use of drugs/alcohol within the last 12 months?: Pt reports using alcohol once in awhile, a half a case. BAL was 275 upon arrival  UDS pending, denies drug use Previous Attempts/Gestures: No How many times?: 0 (reported two earlier in ED, denied to this Probation officer) Other Self Harm Risks: none Triggers for Past Attempts: Unpredictable Intentional Self Injurious Behavior: None Family Suicide History: Unable to assess Recent stressful life event(s): Conflict (Comment) (reports arguments with girlfriend) Persecutory voices/beliefs?: No Depression: Yes Depression Symptoms: Despondent, Tearfulness, Isolating, Fatigue,  Guilt, Loss of interest in usual pleasures, Feeling worthless/self pity, Feeling angry/irritable Substance abuse history and/or treatment for substance abuse?: Yes Suicide prevention information given to non-admitted patients: Yes  Risk to Others: Risk to Others within the past 6 months Homicidal Ideation: No-Not Currently/Within Last 6 Months (denies currently noted earlier in ED) Thoughts of Harm to Others: No-Not Currently Present/Within Last 6 Months Current Homicidal Intent: No-Not Currently/Within Last 6 Months Current Homicidal Plan: No Access to Homicidal Means: No Identified Victim: none History of harm to others?: Yes (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) Assessment of Violence: In distant past Violent Behavior Description: 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 Does patient have access to weapons?: Yes (Comment) Criminal Charges Pending?: No Does patient have a court date: No  Abuse: Abuse/Neglect Assessment (Assessment to be complete while patient is alone) Physical Abuse: Denies Verbal Abuse: Denies Sexual Abuse: Denies Exploitation of patient/patient's resources: Denies Self-Neglect: Denies  Prior Inpatient Therapy: Prior Inpatient Therapy Prior Inpatient Therapy: Yes Prior Therapy Dates: unknown Prior Therapy Facilty/Provider(s): unknown Reason for Treatment: schizphrenia  Prior Outpatient Therapy: Prior Outpatient Therapy Prior Outpatient Therapy: Yes Prior Therapy Dates: current Prior Therapy Facilty/Provider(s): [facility]  Reason for Treatment: schizophrenia, etoh use disorder  Additional Information: Additional Information 1:1 In Past 12 Months?: No CIRT Risk: No Elopement Risk: No Does patient have medical clearance?: No                  Objective: Blood pressure 136/89, pulse 116, temperature 98 F (36.7 C), temperature source Oral, resp. rate 16, SpO2 99 %.There is no weight on file to  calculate BMI. Results for orders placed or performed during the hospital encounter of 02/28/14 (from the past 72 hour(s))  Acetaminophen level     Status: None   Collection Time: 02/28/14  7:32 PM  Result Value Ref Range   Acetaminophen (Tylenol), Serum <15.0 10 - 30 ug/mL    Comment:        THERAPEUTIC CONCENTRATIONS VARY SIGNIFICANTLY. A RANGE OF 10-30 ug/mL MAY BE AN EFFECTIVE CONCENTRATION FOR MANY PATIENTS. HOWEVER, SOME ARE BEST TREATED AT CONCENTRATIONS OUTSIDE THIS RANGE. ACETAMINOPHEN CONCENTRATIONS >150 ug/mL AT 4 HOURS AFTER INGESTION AND >50 ug/mL AT 12 HOURS AFTER INGESTION ARE OFTEN ASSOCIATED WITH TOXIC REACTIONS.   CBC     Status: Abnormal   Collection Time: 02/28/14  7:32 PM  Result Value Ref Range   WBC 10.0 4.0 - 10.5 K/uL   RBC 4.91 4.22 - 5.81 MIL/uL   Hemoglobin 14.7 13.0 - 17.0 g/dL   HCT 43.5 39.0 - 52.0 %   MCV 88.6 78.0 - 100.0 fL   MCH 29.9 26.0 - 34.0 pg   MCHC 33.8 30.0 - 36.0 g/dL   RDW 16.7 (H) 11.5 - 15.5 %   Platelets 349 150 - 400 K/uL  Comprehensive metabolic panel     Status: Abnormal   Collection Time: 02/28/14  7:32 PM  Result Value Ref Range   Sodium 140 137 - 147 mEq/L   Potassium 3.9 3.7 - 5.3 mEq/L   Chloride  103 96 - 112 mEq/L   CO2 19 19 - 32 mEq/L   Glucose, Bld 146 (H) 70 - 99 mg/dL   BUN 10 6 - 23 mg/dL   Creatinine, Ser 0.81 0.50 - 1.35 mg/dL   Calcium 8.6 8.4 - 10.5 mg/dL   Total Protein 8.2 6.0 - 8.3 g/dL   Albumin 4.0 3.5 - 5.2 g/dL   AST 27 0 - 37 U/L   ALT 7 0 - 53 U/L   Alkaline Phosphatase 140 (H) 39 - 117 U/L   Total Bilirubin <0.2 (L) 0.3 - 1.2 mg/dL   GFR calc non Af Amer >90 >90 mL/min   GFR calc Af Amer >90 >90 mL/min    Comment: (NOTE) The eGFR has been calculated using the CKD EPI equation. This calculation has not been validated in all clinical situations. eGFR's persistently <90 mL/min signify possible Chronic Kidney Disease.    Anion gap 18 (H) 5 - 15  Ethanol (ETOH)     Status: Abnormal    Collection Time: 02/28/14  7:32 PM  Result Value Ref Range   Alcohol, Ethyl (B) 275 (H) 0 - 11 mg/dL    Comment:        LOWEST DETECTABLE LIMIT FOR SERUM ALCOHOL IS 11 mg/dL FOR MEDICAL PURPOSES ONLY   Salicylate level     Status: Abnormal   Collection Time: 02/28/14  7:32 PM  Result Value Ref Range   Salicylate Lvl <0.4 (L) 2.8 - 20.0 mg/dL  Urine Drug Screen     Status: Abnormal   Collection Time: 03/01/14  1:31 PM  Result Value Ref Range   Opiates NONE DETECTED NONE DETECTED   Cocaine POSITIVE (A) NONE DETECTED   Benzodiazepines NONE DETECTED NONE DETECTED   Amphetamines NONE DETECTED NONE DETECTED   Tetrahydrocannabinol NONE DETECTED NONE DETECTED   Barbiturates NONE DETECTED NONE DETECTED    Comment:        DRUG SCREEN FOR MEDICAL PURPOSES ONLY.  IF CONFIRMATION IS NEEDED FOR ANY PURPOSE, NOTIFY LAB WITHIN 5 DAYS.        LOWEST DETECTABLE LIMITS FOR URINE DRUG SCREEN Drug Class       Cutoff (ng/mL) Amphetamine      1000 Barbiturate      200 Benzodiazepine   888 Tricyclics       916 Opiates          300 Cocaine          300 THC              50    Labs are reviewed see values above Medications reviewed and no changes made.  Current Facility-Administered Medications  Medication Dose Route Frequency Provider Last Rate Last Dose  . acetaminophen (TYLENOL) tablet 650 mg  650 mg Oral Q4H PRN Carrie Mew, PA-C      . alum & mag hydroxide-simeth (MAALOX/MYLANTA) 200-200-20 MG/5ML suspension 30 mL  30 mL Oral PRN Carrie Mew, PA-C      . LORazepam (ATIVAN) tablet 0-4 mg  0-4 mg Oral 4 times per day Carrie Mew, PA-C   1 mg at 03/01/14 1204   Followed by  . [START ON 03/03/2014] LORazepam (ATIVAN) tablet 0-4 mg  0-4 mg Oral Q12H Carrie Mew, PA-C      . LORazepam (ATIVAN) tablet 1 mg  1 mg Oral Q8H PRN Carrie Mew, PA-C      . nicotine (NICODERM CQ - dosed in mg/24 hours) patch 21 mg  21 mg Transdermal Daily Carrie Mew, PA-C   Stopped at 02/28/14 2159  .  ondansetron (ZOFRAN) tablet 4 mg  4 mg Oral Q8H PRN Carrie Mew, PA-C      . zolpidem [facility]) tablet 5 mg  5 mg Oral QHS PRN Carrie Mew, PA-C       Current Outpatient Prescriptions  Medication Sig Dispense Refill  . acamprosate (CAMPRAL) 333 MG tablet Take 666 mg by mouth 3 (three) times daily with meals.    Marland Kitchen albuterol (PROVENTIL HFA;VENTOLIN HFA) 108 (90 BASE) MCG/ACT inhaler Inhale 1 puff into the lungs 2 (two) times daily as needed for wheezing or shortness of breath.    Marland Kitchen amLODipine-valsartan (EXFORGE) 5-160 MG per tablet Take 1 tablet by mouth daily. 30 tablet 0  . benztropine (COGENTIN) 1 MG tablet Take 1 mg by mouth daily.    . folic acid (FOLVITE) 1 MG tablet Take 1 tablet (1 mg total) by mouth daily. 30 tablet 0  . Multiple Vitamin (MULTIVITAMIN WITH MINERALS) TABS tablet Take 1 tablet by mouth daily. 30 tablet 0  . nicotine (NICODERM CQ - DOSED IN MG/24 HOURS) 21 mg/24hr patch Place 1 patch (21 mg total) onto the skin daily. 28 patch 0  . OLANZapine (ZYPREXA) 15 MG tablet Take 15 mg by mouth at bedtime.    Marland Kitchen oxyCODONE-acetaminophen (PERCOCET/ROXICET) 5-325 MG per tablet Take 2 tablets by mouth every 4 (four) hours as needed (pain.).    Marland Kitchen pantoprazole (PROTONIX) 40 MG tablet Take 1 tablet (40 mg total) by mouth 2 (two) times daily. 84 tablet 0  . pindolol (VISKEN) 5 MG tablet Take 5 mg by mouth 2 (two) times daily.    . risperidone (RISPERDAL) 4 MG tablet Take 4 mg by mouth 2 (two) times daily.    . sucralfate (CARAFATE) 1 G tablet Take 1 g by mouth 4 (four) times daily.    Marland Kitchen thiamine 100 MG tablet Take 1 tablet (100 mg total) by mouth daily. 30 tablet 0    Psychiatric Specialty Exam:     Blood pressure 136/89, pulse 116, temperature 98 F (36.7 C), temperature source Oral, resp. rate 16, SpO2 99 %.There is no weight on file to calculate BMI.  General Appearance: Casual and Disheveled  Eye Contact::  Fair  Speech:  Clear and Coherent and Normal Rate  Volume:  Normal   Mood:  Depressed  Affect:  Depressed  Thought Process:  Circumstantial  Orientation:  Full (Time, Place, and Person)  Thought Content:  "I'm good now"  Suicidal Thoughts:  No Denies but found after stating he would stab himself and knife in lap  Homicidal Thoughts:  No  Memory:  Immediate;   Good Recent;   Good Remote;   Good  Judgement:  Impaired  Insight:  Fair  Psychomotor Activity:  Normal  Concentration:  Fair  Recall:  Good  Fund of Knowledge:Fair  Language: Good  Akathisia:  No  Handed:  Right  AIMS (if indicated):     Assets:  Communication Skills Desire for Improvement Housing  Sleep:      Musculoskeletal: Strength & Muscle Tone: within normal limits Gait & Station: normal Patient leans: N/A  Treatment Plan Summary: Daily contact with patient to assess and evaluate symptoms and progress in treatment Medication management Inpatient treatment for stabilization recommended   Patient accepted to [facility] 306/02  RankinDelphia Grates, FNP-BC 03/01/2014 3:55 PM  Patient seen, evaluated and I agree with notes by Nurse Practitioner. Rayshard Schirtzinger  Darleene Cleaver, MD

## 2014-03-02 DIAGNOSIS — F4310 Post-traumatic stress disorder, unspecified: Secondary | ICD-10-CM | POA: Insufficient documentation

## 2014-03-02 DIAGNOSIS — F1020 Alcohol dependence, uncomplicated: Secondary | ICD-10-CM

## 2014-03-02 DIAGNOSIS — F1490 Cocaine use, unspecified, uncomplicated: Secondary | ICD-10-CM

## 2014-03-02 DIAGNOSIS — F1994 Other psychoactive substance use, unspecified with psychoactive substance-induced mood disorder: Secondary | ICD-10-CM

## 2014-03-02 DIAGNOSIS — F1410 Cocaine abuse, uncomplicated: Secondary | ICD-10-CM | POA: Diagnosis present

## 2014-03-02 LAB — GLUCOSE, CAPILLARY: Glucose-Capillary: 87 mg/dL (ref 70–99)

## 2014-03-02 LAB — LIPID PANEL
CHOL/HDL RATIO: 5 ratio
Cholesterol: 171 mg/dL (ref 0–200)
HDL: 34 mg/dL — ABNORMAL LOW (ref >39)
LDL CALC: 95 mg/dL (ref 0–99)
TRIGLYCERIDES: 212 mg/dL — AB (ref <150)
VLDL: 42 mg/dL — ABNORMAL HIGH (ref 0–40)

## 2014-03-02 MED ORDER — CHLORDIAZEPOXIDE HCL 25 MG PO CAPS
ORAL_CAPSULE | ORAL | Status: AC
  Filled 2014-03-02: qty 1

## 2014-03-02 MED ORDER — OXYCODONE-ACETAMINOPHEN 5-325 MG PO TABS
ORAL_TABLET | ORAL | Status: AC
  Filled 2014-03-02: qty 1

## 2014-03-02 MED ORDER — OXYCODONE-ACETAMINOPHEN 5-325 MG PO TABS
1.0000 | ORAL_TABLET | Freq: Every evening | ORAL | Status: DC | PRN
Start: 2014-03-02 — End: 2014-03-05
  Administered 2014-03-02 – 2014-03-04 (×3): 1 via ORAL
  Filled 2014-03-02: qty 1

## 2014-03-02 MED ORDER — PNEUMOCOCCAL VAC POLYVALENT 25 MCG/0.5ML IJ INJ
0.5000 mL | INJECTION | INTRAMUSCULAR | Status: AC
  Administered 2014-03-03: 0.5 mL via INTRAMUSCULAR

## 2014-03-02 MED ORDER — INFLUENZA VAC SPLIT QUAD 0.5 ML IM SUSY
0.5000 mL | PREFILLED_SYRINGE | INTRAMUSCULAR | Status: AC
  Administered 2014-03-03: 0.5 mL via INTRAMUSCULAR
  Filled 2014-03-02: qty 0.5

## 2014-03-02 MED ORDER — ACAMPROSATE CALCIUM 333 MG PO TBEC
DELAYED_RELEASE_TABLET | ORAL | Status: AC
  Filled 2014-03-02: qty 2

## 2014-03-02 NOTE — [facility] Group Notes (Signed)
[facility] LCSW Aftercare Discharge Planning Group Note   03/02/2014 12:12 PM  Participation Quality:  Patient did not attend group.   Novella Abraha, Eulas Post
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
## 2014-03-02 NOTE — BHH Counselor (Signed)
Adult Comprehensive Assessment  Patient ID: [name], male   DOB: [birth year], 48 y.o.   MRN: [id number]  Information Source: Information source: Patient  Current Stressors:  Educational / Learning stressors: None Employment / Job issues: Patient is disabled Family Relationships: None Surveyor, quantity / Lack of resources (include bankruptcy): None Housing / Lack of housing: None Physical health (include injuries & life threatening diseases): Hypertenion and hx of stroke Social relationships: None Substance abuse: Alcohol Bereavement / Loss: None  Living/Environment/Situation:  Living Arrangements: Alone Living conditions (as described by patient or guardian): Okay How long has patient lived in current situation?: Seven months What is atmosphere in current home: Comfortable, Supportive  Family History:  Marital status: Long term relationship Long term relationship, how long?: Patient reports being in relationship with fiancee for six years What types of issues is patient dealing with in the relationship?: None - patient reports having a good relationship with finance Additional relationship information: N/A Does patient have children?: Yes How many children?: 1 How is patient's relationship with their children?: Patient reports having a good relationship with adult son,  Childhood History:  By whom was/is the patient raised?: Both parents Additional childhood history information: Patient advised of having a very good childhood.   Description of patient's relationship with caregiver when they were a child: Very good Patient's description of current relationship with people who raised him/her: Very good relationship with parents who are in their seventies Does patient have siblings?: Yes Number of Siblings: 2 Description of patient's current relationship with siblings: Patient reports having close relationship with siblings Did patient suffer any verbal/emotional/physical/sexual  abuse as a child?: No Did patient suffer from severe childhood neglect?: No Has patient ever been sexually abused/assaulted/raped as an adolescent or adult?: No Was the patient ever a victim of a crime or a disaster?: No Witnessed domestic violence?: No Has patient been effected by domestic violence as an adult?: No  Education:  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, television/film/video Currently a student?: No Learning disability?: No  Employment/Work Situation:   Employment situation: On disability Why is patient on disability: Patient reports being on disability for health problems as well as mental health issues How long has patient been on disability: Eight years Patient's job has been impacted by current illness: No What is the longest time patient has a held a job?: Patient was unable to answer the questions Where was the patient employed at that time?: N/A Has patient ever been in the [employer]?: No Has patient ever served in Buyer, retail?: No  Financial Resources:   Financial resources: Insurance claims handler Does patient have a Lawyer or guardian?: No  Alcohol/Substance Abuse:   What has been your use of drugs/alcohol within the last 12 months?: Alcohol Alcohol/Substance Abuse Treatment Hx: Past Tx, Inpatient If yes, describe treatment: Patient reports being in a 30 day facility but unable to identiy program Has alcohol/substance abuse ever caused legal problems?: No  Social Support System:   Forensic psychologist System: None Describe Community Support System: N/A Type of faith/religion: Ephriam Knuckles How does patient's faith help to cope with current illness?: Patient shared prayer helps him to make it in tough times.  Leisure/Recreation:   Leisure and Hobbies: Watching TV  Strengths/Needs:   What things does the patient do well?: Kind person In what areas does patient struggle / problems for patient: Sometimes has problems with his family  Discharge Plan:    Does patient have access to transportation?: Yes Will patient be  returning to same living situation after discharge?: Yes Currently receiving community mental health services: Yes (From Whom) ([facility]) Does patient have financial barriers related to discharge medications?: No  Summary/Recommendations:  Jsiah Volkers is a 48 years old Native American male admitted with Major Depression Disorder.  He will benefit from crisis stabilization, evaluation for medication, psycho-education groups for coping skills development, group therapy and case management for discharge planning.     Jolene Guyett, Joesph July. 03/02/2014

## 2014-03-02 NOTE — BHH Suicide Risk Assessment (Signed)
[facility] INPATIENT:  Family/Significant Other Suicide Prevention Education  Suicide Prevention Education:  Education Completed; Jen Mow, Celesta Gentile, [phone]; has been identified by the patient as the family member/significant other with whom the patient will be residing, and identified as the person(s) who will aid the patient in the event of a mental health crisis (suicidal ideations/suicide attempt).  With written consent from the patient, the family member/significant other has been provided the following suicide prevention education, prior to the and/or following the discharge of the patient.  The suicide prevention education provided includes the following:  Suicide risk factors  Suicide prevention and interventions  National Suicide Hotline telephone number  Community [facility] assessment telephone number  [facility] Emergency Assistance Advance and/or Residential Mobile Crisis Unit telephone number  Request made of family/significant other to:  Remove weapons (e.g., guns, rifles, knives), all items previously/currently identified as safety concern.   Financee advised patient does not have access to weapons.     Remove drugs/medications (over-the-counter, prescriptions, illicit drugs), all items previously/currently identified as a safety concern.  The family member/significant other verbalizes understanding of the suicide prevention education information provided.  The family member/significant other agrees to remove the items of safety concern listed above.  Concha Pyo 03/02/2014, 12:13 PM
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
## 2014-03-02 NOTE — Progress Notes (Signed)
Patient ID: [name], male   DOB: 1966-03-01, 48 y.o.   MRN: [id number] PER STATE REGULATIONS 482.30  THIS CHART WAS REVIEWED FOR MEDICAL NECESSITY WITH RESPECT TO THE PATIENT'S ADMISSION/DURATION OF STAY.  NEXT REVIEW DATE: 03/05/14  Roma Schanz, RN, BSN CASE MANAGER

## 2014-03-02 NOTE — Clinical Social Work Note (Signed)
CSW spoke with patient's fiancee, Jen Mow, who advised patient had a change in medications a few months ago.  She advised he had been doing well on the medications but continues to have problems with alcohol.  She is hopeful patient will be able to be started on Antabuse while in the hospital.  CSW contacted [facility] to advised them of patient being in the hospital.  [facility] will transport [facility] at time of discharge.

## 2014-03-02 NOTE — BHH Suicide Risk Assessment (Signed)
Suicide Risk Assessment  Admission Assessment     Nursing information obtained from:    Demographic factors:    Current Mental Status:    Loss Factors:    Historical Factors:    Risk Reduction Factors:    Total Time spent with patient: 45 minutes  CLINICAL FACTORS:   Depression:   Comorbid alcohol abuse/dependence Alcohol/Substance Abuse/Dependencies  COGNITIVE FEATURES THAT CONTRIBUTE TO RISK:  Closed-mindedness Polarized thinking Thought constriction (tunnel vision)    SUICIDE RISK:   Moderate:  Frequent suicidal ideation with limited intensity, and duration, some specificity in terms of plans, no associated intent, good self-control, limited dysphoria/symptomatology, some risk factors present, and identifiable protective factors, including available and accessible social support.  PLAN OF CARE: Supportive approach/coping skills                               Detox with Librium                               Reassess and address the co morbidities  I certify that inpatient services furnished can reasonably be expected to improve the patient's condition.  [place] A 03/02/2014, 12:35 PM

## 2014-03-02 NOTE — [facility] Group Notes (Signed)
[facility] LCSW Group Therapy  Feelings Around Relapse 1:15 -2:30        03/02/2014 2:57 PM    Type of Therapy:  Group Therapy  Participation Level:  Appropriate  Participation Quality:  Appropriate  Affect:  Flat, Depressed  Cognitive:  Attentive Appropriate  Insight:  Developing/Improving  Engagement in Therapy: Developing/Improving  Modes of Intervention:  Discussion Exploration Problem-Solving Supportive  Summary of Progress/Problems:  The topic for today was feelings around relapse.    Patient processed feelings toward relapse and was able to relate to peers. He shared relapsing for him is having a "blast."  He shared is blast is using alcohol and cocaine.  He stated he will use so much that he just does not care about what happens. Patient identified coping skills that can be used to prevent a relapse including allowing others to keep his money and having his ACT Team take him to the store to buy groceries.   Concha Pyo 03/02/2014 2:57 PM

## 2014-03-03 DIAGNOSIS — F1010 Alcohol abuse, uncomplicated: Secondary | ICD-10-CM

## 2014-03-03 DIAGNOSIS — F332 Major depressive disorder, recurrent severe without psychotic features: Secondary | ICD-10-CM

## 2014-03-03 DIAGNOSIS — F331 Major depressive disorder, recurrent, moderate: Secondary | ICD-10-CM | POA: Diagnosis not present

## 2014-03-03 LAB — GLUCOSE, CAPILLARY
GLUCOSE-CAPILLARY: 138 mg/dL — AB (ref 70–99)
Glucose-Capillary: 113 mg/dL — ABNORMAL HIGH (ref 70–99)
Glucose-Capillary: 101 mg/dL — ABNORMAL HIGH (ref 70–99)

## 2014-03-03 LAB — HEMOGLOBIN A1C
HEMOGLOBIN A1C: 6.4 % — AB (ref <5.7)
Mean Plasma Glucose: 137 mg/dL — ABNORMAL HIGH (ref <117)

## 2014-03-03 MED ORDER — NICOTINE 21 MG/24HR TD PT24
MEDICATED_PATCH | TRANSDERMAL | Status: AC
  Administered 2014-03-03: 12:00:00
  Filled 2014-03-03: qty 1

## 2014-03-03 MED ORDER — CHLORDIAZEPOXIDE HCL 25 MG PO CAPS
ORAL_CAPSULE | ORAL | Status: AC
  Administered 2014-03-03: 25 mg via ORAL
  Filled 2014-03-03: qty 1

## 2014-03-03 MED ORDER — METFORMIN HCL 500 MG PO TABS
500.0000 mg | ORAL_TABLET | Freq: Every day | ORAL | Status: DC
  Administered 2014-03-04 – 2014-03-05 (×2): 500 mg via ORAL
  Filled 2014-03-03 (×2): qty 1
  Filled 2014-03-03: qty 3
  Filled 2014-03-03 (×2): qty 1

## 2014-03-03 NOTE — Progress Notes (Signed)
Writer has observed patient up in the dayroom watching tv and interacting appropriately with peers. Patient reports that his day has been good. Patient is compliant with his medications and requested and requested pain medication for his left ankle with pain he rated a 9. He denies si/hi/a/v hallucinations, he plans to return home once discharged. Support and encouragement offered, safety maintained on unit with 15 min checks.

## 2014-03-03 NOTE — Progress Notes (Signed)
.  Psychoeducational Group Note    Date: 03/03/2014 Time:  0930    Goal Setting Purpose of Group: To be able to set a goal that is measurable and that can be accomplished in one day Participation Level:  Active  Participation Quality:  Appropriate  Affect:  Flat  Cognitive:  Oriented  Insight:  Improving  Engagement in Group:  Engaged  Additional Comments:  Pt attended the group and was engaged  [name]

## 2014-03-03 NOTE — Plan of Care (Signed)
Problem: Alteration in mood & ability to function due to Goal: STG-Patient will comply with prescribed medication regimen (Patient will comply with prescribed medication regimen)  Outcome: Progressing Patient is compliant with prescribed hs medications.

## 2014-03-03 NOTE — [facility] Group Notes (Signed)
[place] LCSW Group Therapy 03/03/2014 10:00am  Type of Therapy and Topic: Group Therapy: Avoiding Self-Sabotaging and Enabling Behaviors   Pt did not attend, reason unknown.   Peri Maris, LCSWA 03/03/2014 2:55 PM

## 2014-03-03 NOTE — Progress Notes (Signed)
D) Pt spent a good part of the morning in his bed sleeping. States "the medication makes me very tired and all I want to do is sleep". Is pleasant when he wakes up and is interactive within the group milieu. Pt denies SI and HI. States "I need help because I want to stop drinking". A) Given support, reassurance and praise. Encouragement given for Pt to get up earlier and to partisipate in all the groups, even though he might be a sleepy and tired.  R) Pt denies SI and HI.

## 2014-03-03 NOTE — Progress Notes (Signed)
[facility] MD Progress Note  03/03/2014 11:06 AM [name]  MRN:  [id number] Subjective:  Chart reviewed. Pt interviewed in his room. Pt was lying in bed with eyes closed, and did not want to get up. Pt states "I am just waking up". Pt reported not knowing that there was group therapy going on right now. Pt denies acute issues right now. Diagnosis:   DSM5:  Substance/Addictive Disorders:  Alcohol Related Disorder - Severe (303.90) Depressive Disorders:  Major Depressive Disorder - Severe (296.23) Total Time spent with patient: 20 minutes    ADL's:  Intact  Sleep: Good  Appetite:  Good  Suicidal Ideation: Pt denies. Plan:  denies Intent:  denies Means:  denies Homicidal Ideation: Pt denies. Plan:  denies Intent:  denies Means:  denies AEB (as evidenced by):  Psychiatric Specialty Exam: Physical Exam  ROS  Blood pressure 99/70, pulse 111, temperature 98.5 F (36.9 C), temperature source Oral, resp. rate 16, height 5\' 6"  (1.676 m), weight 95.255 kg (210 lb).Body mass index is 33.91 kg/(m^2).  General Appearance: Disheveled and Guarded  Eye Contact::  None  Speech:  Garbled and Slurred  Volume:  Decreased  Mood:  Depressed  Affect:  Congruent and Depressed  Thought Process:  Goal Directed  Orientation:  Negative  Thought Content:  Negative  Suicidal Thoughts:  No  Homicidal Thoughts:  No  Memory:  Negative  Judgement:  Poor  Insight:  Shallow  Psychomotor Activity:  Decreased  Concentration:  Poor  Recall:  Poor  Fund of Knowledge:Poor  Language: Poor  Akathisia:  Negative  Handed:  Right  AIMS (if indicated):     Assets:  Financial Resources/Insurance  Sleep:  Number of Hours: 6.75   Musculoskeletal: Strength & Muscle Tone: within normal limits Gait & Station: normal Patient leans: N/A  Current Medications: Current Facility-Administered Medications  Medication Dose Route Frequency Provider Last Rate Last Dose  . acamprosate (CAMPRAL) tablet 666 mg  666 mg  Oral TID WC Shuvon Rankin, NP   666 mg at 03/03/14 0629  . albuterol (PROVENTIL HFA;VENTOLIN HFA) 108 (90 BASE) MCG/ACT inhaler 1 puff  1 puff Inhalation BID PRN Shuvon Rankin, NP      . alum & mag hydroxide-simeth (MAALOX/MYLANTA) 200-200-20 MG/5ML suspension 30 mL  30 mL Oral PRN Shuvon Rankin, NP      . amLODipine (NORVASC) tablet 5 mg  5 mg Oral Daily Shuvon Rankin, NP   5 mg at 03/02/14 0858   And  . irbesartan (AVAPRO) tablet 150 mg  150 mg Oral Daily Shuvon Rankin, NP   150 mg at 03/02/14 0859  . benztropine (COGENTIN) tablet 1 mg  1 mg Oral Daily Shuvon Rankin, NP   1 mg at 03/02/14 0859  . chlordiazePOXIDE (LIBRIUM) 25 MG capsule           . chlordiazePOXIDE (LIBRIUM) capsule 25 mg  25 mg Oral QID Shuvon Rankin, NP   25 mg at 03/02/14 2138   Followed by  . chlordiazePOXIDE (LIBRIUM) capsule 25 mg  25 mg Oral TID Shuvon Rankin, NP       Followed by  . [START ON 03/04/2014] chlordiazePOXIDE (LIBRIUM) capsule 25 mg  25 mg Oral BH-qamhs Shuvon Rankin, NP       Followed by  . [START ON 03/06/2014] chlordiazePOXIDE (LIBRIUM) capsule 25 mg  25 mg Oral Daily Shuvon Rankin, NP      . chlordiazePOXIDE (LIBRIUM) capsule 25 mg  25 mg Oral Q6H PRN Shuvon Rankin, NP      .  folic acid (FOLVITE) tablet 1 mg  1 mg Oral Daily Shuvon Rankin, NP   1 mg at 03/02/14 0859  . hydrOXYzine (ATARAX/VISTARIL) tablet 25 mg  25 mg Oral Q6H PRN Shuvon Rankin, NP      . Influenza vac split quadrivalent PF (FLUARIX) injection 0.5 mL  0.5 mL Intramuscular Tomorrow-1000 Nicholaus Bloom, MD      . loperamide (IMODIUM) capsule 2-4 mg  2-4 mg Oral PRN Shuvon Rankin, NP      . magnesium hydroxide (MILK OF MAGNESIA) suspension 30 mL  30 mL Oral Daily PRN Shuvon Rankin, NP      . multivitamin with minerals tablet 1 tablet  1 tablet Oral Daily Shuvon Rankin, NP   1 tablet at 03/02/14 0859  . nicotine (NICODERM CQ - dosed in mg/24 hours) 21 mg/24hr patch           . nicotine (NICODERM CQ - dosed in mg/24 hours) patch 21 mg  21 mg  Transdermal Daily Shuvon Rankin, NP   21 mg at 03/02/14 0905  . OLANZapine (ZYPREXA) tablet 15 mg  15 mg Oral QHS Shuvon Rankin, NP   15 mg at 03/02/14 2139  . ondansetron (ZOFRAN) tablet 4 mg  4 mg Oral Q8H PRN Shuvon Rankin, NP      . oxyCODONE-acetaminophen (PERCOCET/ROXICET) 5-325 MG per tablet 1 tablet  1 tablet Oral QHS PRN Nicholaus Bloom, MD   1 tablet at 03/02/14 2142  . pantoprazole (PROTONIX) EC tablet 40 mg  40 mg Oral BID Shuvon Rankin, NP   40 mg at 03/02/14 1700  . pindolol (VISKEN) tablet 5 mg  5 mg Oral BID Shuvon Rankin, NP   5 mg at 03/02/14 1854  . pneumococcal 23 valent vaccine (PNU-IMMUNE) injection 0.5 mL  0.5 mL Intramuscular Tomorrow-1000 Nicholaus Bloom, MD      . risperiDONE (RISPERDAL) tablet 4 mg  4 mg Oral BID Shuvon Rankin, NP   4 mg at 03/02/14 1851  . sucralfate (CARAFATE) tablet 1 g  1 g Oral QID Shuvon Rankin, NP   1 g at 03/02/14 2138  . thiamine (B-1) injection 100 mg  100 mg Intramuscular Once Shuvon Rankin, NP   100 mg at 03/01/14 2200  . thiamine (VITAMIN B-1) tablet 100 mg  100 mg Oral Daily Shuvon Rankin, NP   100 mg at 03/02/14 6433    Lab Results:  Results for orders placed or performed during the hospital encounter of 03/01/14 (from the past 48 hour(s))  Hemoglobin A1c     Status: Abnormal   Collection Time: 03/02/14  7:34 PM  Result Value Ref Range   Hgb A1c MFr Bld 6.4 (H) <5.7 %    Comment: (NOTE)                                                                       According to the ADA Clinical Practice Recommendations for 2011, when HbA1c is used as a screening test:  >=6.5%   Diagnostic of Diabetes Mellitus           (if abnormal result is confirmed) 5.7-6.4%   Increased risk of developing Diabetes Mellitus References:Diagnosis and Classification of Diabetes Mellitus,Diabetes IRJJ,8841,66(AYTKZ 1):S62-S69 and Standards of Medical Care in  Diabetes - 2011,Diabetes Care,2011,34 (Suppl 1):S11-S61.    Mean Plasma Glucose 137 (H) <117  mg/dL    Comment: Performed at [facility]  Lipid panel     Status: Abnormal   Collection Time: 03/02/14  7:34 PM  Result Value Ref Range   Cholesterol 171 0 - 200 mg/dL   Triglycerides 212 (H) <150 mg/dL   HDL 34 (L) >39 mg/dL   Total CHOL/HDL Ratio 5.0 RATIO   VLDL 42 (H) 0 - 40 mg/dL   LDL Cholesterol 95 0 - 99 mg/dL    Comment:        Total Cholesterol/HDL:CHD Risk Coronary Heart Disease Risk Table                     Men   Women  1/2 Average Risk   3.4   3.3  Average Risk       5.0   4.4  2 X Average Risk   9.6   7.1  3 X Average Risk  23.4   11.0        Use the calculated Patient Ratio above and the CHD Risk Table to determine the patient's CHD Risk.        ATP III CLASSIFICATION (LDL):  <100     mg/dL   Optimal  100-129  mg/dL   Near or Above                    Optimal  130-159  mg/dL   Borderline  160-189  mg/dL   High  >190     mg/dL   Very High Performed at [facility]   Glucose, capillary     Status: None   Collection Time: 03/02/14  9:08 PM  Result Value Ref Range   Glucose-Capillary 87 70 - 99 mg/dL  Glucose, capillary     Status: Abnormal   Collection Time: 03/03/14  6:06 AM  Result Value Ref Range   Glucose-Capillary 113 (H) 70 - 99 mg/dL    Physical Findings: AIMS: Facial and Oral Movements Muscles of Facial Expression: None, normal Lips and Perioral Area: None, normal Jaw: None, normal Tongue: None, normal,Extremity Movements Upper (arms, wrists, hands, fingers): None, normal Lower (legs, knees, ankles, toes): None, normal, Trunk Movements Neck, shoulders, hips: None, normal, Overall Severity Severity of abnormal movements (highest score from questions above): None, normal Incapacitation due to abnormal movements: None, normal Patient's awareness of abnormal movements (rate only patient's report): No Awareness, Dental Status Current problems with teeth and/or dentures?: No Does patient usually wear dentures?: No  CIWA:   CIWA-Ar Total: 0 COWS:     Treatment Plan Summary: Daily contact with patient to assess and evaluate symptoms and progress in treatment Medication management continue librium protocol.  Plan:  Medical Decision Making Problem Points:  Established problem, stable/improving (1) Data Points:  Review of medication regiment & side effects (2)  I certify that inpatient services furnished can reasonably be expected to improve the patient's condition.   Tamme Mozingo 03/03/2014, 11:06 AM

## 2014-03-03 NOTE — Progress Notes (Signed)
Psychoeducational Group Note  Date: 03/03/2014 Time:  1315 Group Topic/Focus:  Identifying Needs:   The focus of this group is to help patients identify their personal needs that have been historically problematic and identify healthy behaviors to address their needs.  Participation Level:  Active  Participation Quality:  Appropriate  Affect:  Appropriate  Cognitive:  Oriented  Insight:  Improving  Engagement in Group:  Engaged  Additional Comments:  Pt was quite attentive and participated in the group.  Paulino Rily

## 2014-03-04 LAB — GLUCOSE, CAPILLARY
GLUCOSE-CAPILLARY: 97 mg/dL (ref 70–99)
Glucose-Capillary: 133 mg/dL — ABNORMAL HIGH (ref 70–99)

## 2014-03-04 MED ORDER — OXYCODONE-ACETAMINOPHEN 5-325 MG PO TABS
ORAL_TABLET | ORAL | Status: AC
  Filled 2014-03-04: qty 1

## 2014-03-04 MED ORDER — INSULIN ASPART 100 UNIT/ML ~~LOC~~ SOLN
0.0000 [IU] | Freq: Three times a day (TID) | SUBCUTANEOUS | Status: DC
  Administered 2014-03-04: 2 [IU] via SUBCUTANEOUS

## 2014-03-04 NOTE — Progress Notes (Signed)
Patient has been up active on the unit despite his left ankle pain. Patient reports that his day has been good and his girlfriend is very supportive of him. He denies withdrawal symptoms and is compliant with is medications. He denies si/hi/a/v hallucinations. Support and encouragement given, safety maintained on unit with 15 min checks.

## 2014-03-04 NOTE — [facility] Group Notes (Addendum)
[place] LCSW Group Therapy 03/04/2014 10:00am  Type of Therapy: Group Therapy- Feelings Around Discharge & Establishing a Supportive Framework  Participation Level: Minimal   Modes of Intervention: Clarification, Confrontation, Discussion, Education, Exploration, Limit-setting, Orientation, Problem-solving, Rapport Building, Art therapist, Socialization and Support   Description of Group:   What is a supportive framework? What does it look like feel like and how do I discern it from and unhealthy non-supportive network? Learn how to cope when supports are not helpful and don't support you. Discuss what to do when your family/friends are not supportive.  Summary of Patient Progress Pt did not participate in group discussion; was sleeping though part of discussion.  Therapeutic Modalities:   Cognitive Behavioral Therapy Person-Centered Therapy Motivational Interviewing   Peri Maris, [place] 03/04/2014 11:06 AM

## 2014-03-04 NOTE — Progress Notes (Signed)
D) Pt has not attended the groups today. Has been sleeping. Pt's fiance' called and provided collateral information about the Patient.  States that Pt started doing really well after being started on the Risperdal. Feels that he is "back to himself". Also reported that Pt has never been diagnosed with Diabetes and is not at all sure why Pt is on insulin. Pt rates his depression  At a 0, his hopelessness at a 1 and his anxiety at a 1. Denies SI and HI. A) Given support reassurance and praise. Encouraged to attend the groups when he is able to stay awake. Provided with a 1:1 R) Pt denies SI and HI.

## 2014-03-04 NOTE — Progress Notes (Signed)
Writer spoke with patient 1:1 after he requested his prn pain medication for his left ankle pain. He reports his day as being good and is hopeful to discharge on tomorrow. He attended group a little late b/c he wanted to take his pain medication before group. Writer encouraged him to keep it elevated as much as possible while sitting. He denies si/hi/a/v hallucinations. Support and encouragement given, safety maintained on unit with 15 min checks.

## 2014-03-04 NOTE — Plan of Care (Signed)
Problem: Alteration in mood & ability to function due to Goal: STG-Patient will comply with prescribed medication regimen (Patient will comply with prescribed medication regimen)  Outcome: Progressing Patient is compliant with his prescribed hs medications.

## 2014-03-04 NOTE — Progress Notes (Signed)
[facility] MD Progress Note  03/04/2014 12:05 PM [name]  MRN:  [id number] Subjective:  Chart reviewed. Pt interviewed in his room. Pt reports feeling better today. Pt feels meds help keep him focused. Mood "good". No withdrawal sxs today. Pt has been participating in groups. Pt was started on metformin yesterday for HgbA1c of 6.4. Pt feels risperdal is more effective than zyprexa, so pt is agreeable to d/c'ing zyprexa at this time (d/t elevated HgbA1c).  Diagnosis:   DSM5:  Substance/Addictive Disorders:  Alcohol Related Disorder - Severe (303.90) Depressive Disorders:  Major Depressive Disorder - Severe (296.23) Total Time spent with patient: 20 minutes    ADL's:  Intact  Sleep: Good  Appetite:  Good  Suicidal Ideation: Pt denies. Plan:  denies Intent:  denies Means:  denies Homicidal Ideation: Pt denies. Plan:  denies Intent:  denies Means:  denies AEB (as evidenced by):  Psychiatric Specialty Exam: Physical Exam  ROS  Blood pressure 106/79, pulse 129, temperature 99 F (37.2 C), temperature source Oral, resp. rate 20, height 5\' 6"  (1.676 m), weight 95.255 kg (210 lb).Body mass index is 33.91 kg/(m^2).  General Appearance: Fairly Groomed and Guarded  Engineer, water::  None  Speech:  Slow  Volume:  Decreased  Mood:  "better than yesterday"  Affect:  Congruent and Depressed  Thought Process:  Goal Directed  Orientation:  Negative  Thought Content:  Negative  Suicidal Thoughts:  No  Homicidal Thoughts:  No  Memory:  Negative  Judgement:  Poor  Insight:  Shallow  Psychomotor Activity:  Decreased  Concentration:  Poor  Recall:  Poor  Fund of Knowledge:Fair  Language: Fair  Akathisia:  Negative  Handed:  Right  AIMS (if indicated):     Assets:  Financial Resources/Insurance  Sleep:  Number of Hours: 6.25   Musculoskeletal: Strength & Muscle Tone: within normal limits Gait & Station: normal Patient leans: N/A  Current Medications: Current  Facility-Administered Medications  Medication Dose Route Frequency Provider Last Rate Last Dose  . acamprosate (CAMPRAL) tablet 666 mg  666 mg Oral TID WC Shuvon Rankin, NP   666 mg at 03/04/14 1147  . albuterol (PROVENTIL HFA;VENTOLIN HFA) 108 (90 BASE) MCG/ACT inhaler 1 puff  1 puff Inhalation BID PRN Shuvon Rankin, NP      . alum & mag hydroxide-simeth (MAALOX/MYLANTA) 200-200-20 MG/5ML suspension 30 mL  30 mL Oral PRN Shuvon Rankin, NP      . amLODipine (NORVASC) tablet 5 mg  5 mg Oral Daily Shuvon Rankin, NP   5 mg at 03/04/14 0956   And  . irbesartan (AVAPRO) tablet 150 mg  150 mg Oral Daily Shuvon Rankin, NP   150 mg at 03/04/14 0957  . benztropine (COGENTIN) tablet 1 mg  1 mg Oral Daily Shuvon Rankin, NP   1 mg at 03/04/14 0957  . chlordiazePOXIDE (LIBRIUM) capsule 25 mg  25 mg Oral BH-qamhs Shuvon Rankin, NP       Followed by  . [START ON 03/06/2014] chlordiazePOXIDE (LIBRIUM) capsule 25 mg  25 mg Oral Daily Shuvon Rankin, NP      . chlordiazePOXIDE (LIBRIUM) capsule 25 mg  25 mg Oral Q6H PRN Shuvon Rankin, NP      . folic acid (FOLVITE) tablet 1 mg  1 mg Oral Daily Shuvon Rankin, NP   1 mg at 03/04/14 0956  . hydrOXYzine (ATARAX/VISTARIL) tablet 25 mg  25 mg Oral Q6H PRN Shuvon Rankin, NP      . insulin aspart (novoLOG) injection  0-15 Units  0-15 Units Subcutaneous TID WC Encarnacion Slates, NP   2 Units at 03/04/14 1203  . loperamide (IMODIUM) capsule 2-4 mg  2-4 mg Oral PRN Shuvon Rankin, NP      . magnesium hydroxide (MILK OF MAGNESIA) suspension 30 mL  30 mL Oral Daily PRN Shuvon Rankin, NP      . metFORMIN (GLUCOPHAGE) tablet 500 mg  500 mg Oral Q breakfast Encarnacion Slates, NP   500 mg at 03/04/14 0956  . multivitamin with minerals tablet 1 tablet  1 tablet Oral Daily Shuvon Rankin, NP   1 tablet at 03/04/14 0955  . nicotine (NICODERM CQ - dosed in mg/24 hours) patch 21 mg  21 mg Transdermal Daily Shuvon Rankin, NP   21 mg at 03/04/14 0955  . OLANZapine (ZYPREXA) tablet 15 mg  15 mg Oral  QHS Shuvon Rankin, NP   15 mg at 03/03/14 2125  . ondansetron (ZOFRAN) tablet 4 mg  4 mg Oral Q8H PRN Shuvon Rankin, NP      . oxyCODONE-acetaminophen (PERCOCET/ROXICET) 5-325 MG per tablet 1 tablet  1 tablet Oral QHS PRN Brian Bloom, MD   1 tablet at 03/03/14 1953  . pantoprazole (PROTONIX) EC tablet 40 mg  40 mg Oral BID Shuvon Rankin, NP   40 mg at 03/04/14 0956  . pindolol (VISKEN) tablet 5 mg  5 mg Oral BID Shuvon Rankin, NP   5 mg at 03/04/14 0955  . risperiDONE (RISPERDAL) tablet 4 mg  4 mg Oral BID Shuvon Rankin, NP   4 mg at 03/04/14 1001  . sucralfate (CARAFATE) tablet 1 g  1 g Oral QID Shuvon Rankin, NP   1 g at 03/04/14 1152  . thiamine (B-1) injection 100 mg  100 mg Intramuscular Once Shuvon Rankin, NP   100 mg at 03/01/14 2200  . thiamine (VITAMIN B-1) tablet 100 mg  100 mg Oral Daily Shuvon Rankin, NP   100 mg at 03/04/14 1017    Lab Results:  Results for orders placed or performed during the hospital encounter of 03/01/14 (from the past 48 hour(s))  Hemoglobin A1c     Status: Abnormal   Collection Time: 03/02/14  7:34 PM  Result Value Ref Range   Hgb A1c MFr Bld 6.4 (H) <5.7 %    Comment: (NOTE)                                                                       According to the ADA Clinical Practice Recommendations for 2011, when HbA1c is used as a screening test:  >=6.5%   Diagnostic of Diabetes Mellitus           (if abnormal result is confirmed) 5.7-6.4%   Increased risk of developing Diabetes Mellitus References:Diagnosis and Classification of Diabetes Mellitus,Diabetes PZWC,5852,77(OEUMP 1):S62-S69 and Standards of Medical Care in         Diabetes - 2011,Diabetes Care,2011,34 (Suppl 1):S11-S61.    Mean Plasma Glucose 137 (H) <117 mg/dL    Comment: Performed at [facility]  Lipid panel     Status: Abnormal   Collection Time: 03/02/14  7:34 PM  Result Value Ref Range   Cholesterol 171 0 - 200 mg/dL   Triglycerides 212 (  H) <150 mg/dL   HDL 34 (L) >39  mg/dL   Total CHOL/HDL Ratio 5.0 RATIO   VLDL 42 (H) 0 - 40 mg/dL   LDL Cholesterol 95 0 - 99 mg/dL    Comment:        Total Cholesterol/HDL:CHD Risk Coronary Heart Disease Risk Table                     Men   Women  1/2 Average Risk   3.4   3.3  Average Risk       5.0   4.4  2 X Average Risk   9.6   7.1  3 X Average Risk  23.4   11.0        Use the calculated Patient Ratio above and the CHD Risk Table to determine the patient's CHD Risk.        ATP III CLASSIFICATION (LDL):  <100     mg/dL   Optimal  100-129  mg/dL   Near or Above                    Optimal  130-159  mg/dL   Borderline  160-189  mg/dL   High  >190     mg/dL   Very High Performed at [facility]   Glucose, capillary     Status: None   Collection Time: 03/02/14  9:08 PM  Result Value Ref Range   Glucose-Capillary 87 70 - 99 mg/dL  Glucose, capillary     Status: Abnormal   Collection Time: 03/03/14  6:06 AM  Result Value Ref Range   Glucose-Capillary 113 (H) 70 - 99 mg/dL  Glucose, capillary     Status: Abnormal   Collection Time: 03/03/14  5:15 PM  Result Value Ref Range   Glucose-Capillary 138 (H) 70 - 99 mg/dL   Comment 1 Notify RN   Glucose, capillary     Status: Abnormal   Collection Time: 03/03/14  9:22 PM  Result Value Ref Range   Glucose-Capillary 101 (H) 70 - 99 mg/dL   Comment 1 Notify RN   Glucose, capillary     Status: Abnormal   Collection Time: 03/04/14  6:09 AM  Result Value Ref Range   Glucose-Capillary 133 (H) 70 - 99 mg/dL   Comment 1 Notify RN     Physical Findings: AIMS: Facial and Oral Movements Muscles of Facial Expression: None, normal Lips and Perioral Area: None, normal Jaw: None, normal Tongue: None, normal,Extremity Movements Upper (arms, wrists, hands, fingers): None, normal Lower (legs, knees, ankles, toes): None, normal, Trunk Movements Neck, shoulders, hips: None, normal, Overall Severity Severity of abnormal movements (highest score from questions  above): None, normal Incapacitation due to abnormal movements: None, normal Patient's awareness of abnormal movements (rate only patient's report): No Awareness, Dental Status Current problems with teeth and/or dentures?: No Does patient usually wear dentures?: No  CIWA:  CIWA-Ar Total: 0 COWS:     Treatment Plan Summary: Daily contact with patient to assess and evaluate symptoms and progress in treatment Medication management continue librium protocol. will d/c zyprexa due to elevated HgbA1c.  Plan:  Medical Decision Making Problem Points:  Established problem, stable/improving (1) Data Points:  Review of medication regiment & side effects (2)  I certify that inpatient services furnished can reasonably be expected to improve the patient's condition.   Dereck Leep 03/04/2014, 12:05 PM

## 2014-03-04 NOTE — [facility] Group Notes (Signed)
[place] Group Notes:  relaxation  Date:  03/04/2014  Time:  11:52 AM  Type of Therapy:  Nurse Education  Participation Level:  Did Not Attend  Participation Quality:  Intrusive and Inattentive  Affect:  Flat  Cognitive:  Lacking  Insight:  None  Engagement in Group:  None  Modes of Intervention:  Education  Summary of Progress/Problems:  Delman Kitten 03/04/2014, 11:52 AM

## 2014-03-04 NOTE — Progress Notes (Signed)
Patient attended Angie meeting. Tonight we had a speaker meeting. They actively listened. Pt kept to himself for most of the evening, but he sat in the hall and spoke when spoken to. He had complaints of pain that were addressed with hot packs by this Probation officer and were also addressed by his nurse. He was appropriate and cooperative.   Harrie Foreman A

## 2014-03-04 NOTE — [facility] Group Notes (Signed)
[facility] Group Notes:  Healthy Life Skills Date:  03/04/2014  Time:  2:57 PM  Type of Therapy:  Nurse Education  Participation Level:  Did Not Attend  Participation Quality:  Inattentive  Affect:  Flat  Cognitive:  Lacking  Insight:  None  Engagement in Group:  None  Modes of Intervention:  Discussion  Summary of Progress/Problems:  [name] 03/04/2014, 2:57 PM

## 2014-03-05 DIAGNOSIS — F10229 Alcohol dependence with intoxication, unspecified: Secondary | ICD-10-CM | POA: Insufficient documentation

## 2014-03-05 DIAGNOSIS — F331 Major depressive disorder, recurrent, moderate: Secondary | ICD-10-CM | POA: Insufficient documentation

## 2014-03-05 LAB — GLUCOSE, CAPILLARY: GLUCOSE-CAPILLARY: 108 mg/dL — AB (ref 70–99)

## 2014-03-05 MED ORDER — PANTOPRAZOLE SODIUM 40 MG PO TBEC: 40.0000 mg | DELAYED_RELEASE_TABLET | Freq: Two times a day (BID) | ORAL | Status: DC

## 2014-03-05 MED ORDER — OXYCODONE-ACETAMINOPHEN 5-325 MG PO TABS: 2.0000 | ORAL_TABLET | ORAL | Status: DC | PRN

## 2014-03-05 MED ORDER — NICOTINE 21 MG/24HR TD PT24: 21.0000 mg | MEDICATED_PATCH | Freq: Every day | TRANSDERMAL | Status: DC

## 2014-03-05 MED ORDER — METFORMIN HCL 500 MG PO TABS: 500.0000 mg | ORAL_TABLET | Freq: Every day | ORAL | Status: DC

## 2014-03-05 MED ORDER — SUCRALFATE 1 G PO TABS: 1.0000 g | ORAL_TABLET | Freq: Four times a day (QID) | ORAL | Status: DC

## 2014-03-05 MED ORDER — BENZTROPINE MESYLATE 1 MG PO TABS: 1.0000 mg | ORAL_TABLET | Freq: Every day | ORAL | Status: DC

## 2014-03-05 MED ORDER — ADULT MULTIVITAMIN W/MINERALS CH: 1.0000 | ORAL_TABLET | Freq: Every day | ORAL | Status: DC

## 2014-03-05 MED ORDER — PINDOLOL 5 MG PO TABS: 5.0000 mg | ORAL_TABLET | Freq: Two times a day (BID) | ORAL | Status: DC

## 2014-03-05 MED ORDER — ALBUTEROL SULFATE HFA 108 (90 BASE) MCG/ACT IN AERS: 1.0000 | INHALATION_SPRAY | Freq: Two times a day (BID) | RESPIRATORY_TRACT | Status: DC | PRN

## 2014-03-05 MED ORDER — RISPERIDONE 4 MG PO TABS: 4.0000 mg | ORAL_TABLET | Freq: Two times a day (BID) | ORAL | Status: DC

## 2014-03-05 MED ORDER — FOLIC ACID 1 MG PO TABS: 1.0000 mg | ORAL_TABLET | Freq: Every day | ORAL | Status: DC

## 2014-03-05 MED ORDER — ACAMPROSATE CALCIUM 333 MG PO TBEC: 666.0000 mg | DELAYED_RELEASE_TABLET | Freq: Three times a day (TID) | ORAL | Status: DC

## 2014-03-05 MED ORDER — AMLODIPINE BESYLATE-VALSARTAN 5-160 MG PO TABS: 1.0000 | ORAL_TABLET | Freq: Every day | ORAL | Status: DC

## 2014-03-05 NOTE — Plan of Care (Signed)
Problem: Alteration in mood & ability to function due to Goal: LTG-Patient demonstrates decreased signs of withdrawal Goal met. Patient is not endorsing any sign/symptoms of withdrawal at this time. No CIWA noted for today.  Joette Catching, LCSW Clinical Social Worker [phone]  03/05/2014 1:12 PM     (Patient demonstrates decreased signs of withdrawal to the point the patient is safe to return home and continue treatment in an outpatient setting)  Outcome: Completed/Met Date Met:  03/05/14

## 2014-03-05 NOTE — [facility] Group Notes (Signed)
[facility] LCSW Aftercare Discharge Planning Group Note   03/05/2014 10:43 AM    Participation Quality:  Appropraite  Mood/Affect:  Appropriate  Depression Rating:  1  Anxiety Rating:  1  Thoughts of Suicide:  No  Will you contract for safety?   NA  Current AVH:  No  Plan for Discharge/Comments:  Patient attended discharge planning group and actively participated in group. He reports doing well and being ready to discharge today.  He will follow up with his ACT Team, [facility].  Suicide prevention education reviewed and SPE document provided.   Transportation Means: Patient has transportation.   Supports:  Patient has a support system.   Cornel Werber, Eulas Post

## 2014-03-05 NOTE — Progress Notes (Signed)
[facility] Adult Case Management Discharge Plan :  Will you be returning to the same living situation after discharge: Yes,  Patient is returning to his home. At discharge, do you have transportation home?:Yes,  Patient to be transported home by [facility]. Do you have the ability to pay for your medications:Yes,  Patient has [employer].  Release of information consent forms completed and in the chart;  Patient's signature needed at discharge.  Patient to Follow up at: Follow-up Information    Follow up with [facility] On 03/05/2014.   Why:  Paitent will be pick up fo discharge today.   Contact information:   [address] [place], [place]   [address]  [phone]      Patient denies SI/HI:   Patient no longer endorsing SI/HI or other thoughts of self harm.   Safety Planning and Suicide Prevention discussed: .Reviewed with all patients during discharge planning group   Ashlye Oviedo Hairston 03/05/2014, 1:10 PM

## 2014-03-05 NOTE — Progress Notes (Signed)
The patient attended group last evening and was appropriate.

## 2014-03-05 NOTE — Discharge Summary (Signed)
Physician Discharge Summary Note  Patient:  [name] is an 48 y.o., male MRN:  [id number] DOB:  Apr 18, 1965 Patient phone:  [phone] (home)  Patient address:   [address] [place] [address],  Total Time spent with patient: Greater than 30 minutes  Date of Admission:  03/01/2014 Date of Discharge: 03/05/14  Reason for Admission: Mood stabilization Treatment  Discharge Diagnoses: Principal Problem:   Alcohol dependence Active Problems:   MDD (major depressive disorder)   Cocaine abuse   PTSD (post-traumatic stress disorder)   Alcohol dependence with intoxication with complication   Major depressive disorder, recurrent episode, moderate   Psychiatric Specialty Exam: Physical Exam  Psychiatric: His speech is normal and behavior is normal. Judgment and thought content normal. His mood appears not anxious. His affect is not angry, not blunt, not labile and not inappropriate. Cognition and memory are normal. He does not exhibit a depressed mood.    Review of Systems  Constitutional: Negative.   HENT: Negative.   Eyes: Negative.   Respiratory: Negative.   Cardiovascular: Negative.   Gastrointestinal: Negative.   Genitourinary: Negative.   Musculoskeletal: Negative.   Skin: Negative.   Neurological: Negative.   Endo/Heme/Allergies: Negative.   Psychiatric/Behavioral: Positive for depression and substance abuse (Alcoholism, cocaine abuse). Negative for suicidal ideas, hallucinations and memory loss. The patient has insomnia (Stable). The patient is not nervous/anxious.     Blood pressure 105/80, pulse 102, temperature 97.8 F (36.6 C), temperature source Oral, resp. rate 24, height 5\' 6"  (1.676 m), weight 95.255 kg (210 lb).Body mass index is 33.91 kg/(m^2).  See Md's SRA                                                 Past Psychiatric History: Diagnosis:Alcohol dependence  Hospitalizations: [facility]  Outpatient Care: ACT team  Substance  Abuse Care:  Self-Mutilation: Denies  Suicidal Attempts: Denies  Violent Behaviors: Denies   Musculoskeletal: Strength & Muscle Tone: within normal limits Gait & Station: normal Patient leans: N/A  Obsessive-Compulsive Disorders:  NA Trauma-Stressor Disorders:  Posttraumatic Stress Disorder (309.81) Substance/Addictive Disorders:  Alcohol Related Disorder - Severe (303.90) Depressive Disorders:  Major depressive disorder, recurrent episodes, moderate  Axis Diagnosis:  AXIS I:  Alcohol dependence,  Major depressive disorder, recurrent episodes, moderate, PTSD AXIS II:  Deferred AXIS III:   Past Medical History  Diagnosis Date  . Hypertension   . Alcoholism     pt is also bi polar  . Stroke 05/15/2010    pt states stroke in 2011  . Schizophrenic disorder    AXIS IV:  other psychosocial or environmental problems and Alcoholism, chronic AXIS V:  63  Level of Care:  OP  Hospital Course: 48 Y/O male who states " I dont know what happened" Girlfriend left and things went haywired States he sees an ACT Team. States he got intoxicated. States he can drink 12-24 beers two days in a row. Every 2 months or so gets depressed and does the drinking. States he had been gone 2-3 days might explain the cocaine in his urine. Today he denies the statements written when he came initially to the ED, states "I was drunk".  After admission assessment and evaluation, it was determined that [name] was intoxicated & will need detoxification treatment to re-stabilize his system of alcohol intoxication and to combat  the withdrawal symptoms of alcohol. His UDS test result was positive for cocaine and toxicology test results showed a BAL of 275. [name] was then started on Librium detox protocols for his alcohol detoxification treatment. He was also enrolled in the group counseling sessions and activities to learn coping skills that should help him after discharge to cope better, manage his substance  abuse problems to maintain a much longer sobriety.   Besides the detoxification treatment protocols, [name] was medicated and discharged on Risperdal 4 mg twice daily for mood control, Cogentin 1 mg daily for prevention of EPS and CAMPRAL 666 mg three times daily for alcohol dependence. He was also enrolled and attended AA/NA meetings being offered and held on this unit. [name] presented with some previous and or identifiable medical conditions that required treatment and or monitoring. He received medication management for all those health issues as well. He was monitored closely for any potential problems that may arise as a result of and or during detoxification treatment. Patient tolerated his treatment regimen and detoxification treatment without any significant adverse effects and or reactions reported.  [name] has completed detox treatment and his mood is stable. He is currently being discharged to continue psychiatric treatment and routine medication management with his [facility] Team. He is provided with all the pertinent information required to make this appointment without problems. Upon discharge, patient adamantly denies suicidal, homicidal ideations, auditory, visual hallucinations, delusional thinking and or withdrawal symptoms. He left [facility] with all personal belongings in no apparent distress. He received a 4 days worth supply samples of his [facility] discharge medications. Transportation per his ACT Team.   Consults:  psychiatry  Significant Diagnostic Studies:  labs:  with diff, CMP, UDS, toxicology tests, U/A, results reviewed, stable  Discharge Vitals:   Blood pressure 105/80, pulse 102, temperature 97.8 F (36.6 C), temperature source Oral, resp. rate 24, height 5\' 6"  (1.676 m), weight 95.255 kg (210 lb). Body mass index is 33.91 kg/(m^2). Lab Results:   Results for orders placed or performed during the hospital encounter of 03/01/14 (from the past 72 hour(s))  Hemoglobin A1c     Status:  Abnormal   Collection Time: 03/02/14  7:34 PM  Result Value Ref Range   Hgb A1c MFr Bld 6.4 (H) <5.7 %    Comment: (NOTE)                                                                       According to the ADA Clinical Practice Recommendations for 2011, when HbA1c is used as a screening test:  >=6.5%   Diagnostic of Diabetes Mellitus           (if abnormal result is confirmed) 5.7-6.4%   Increased risk of developing Diabetes Mellitus References:Diagnosis and Classification of Diabetes Mellitus,Diabetes Care,2011,34(Suppl 1):S62-S69 and Standards of Medical Care in         Diabetes - 2011,Diabetes Care,2011,34 (Suppl 1):S11-S61.    Mean Plasma Glucose 137 (H) <117 mg/dL    Comment: Performed at [facility]  Lipid panel     Status: Abnormal   Collection Time: 03/02/14  7:34 PM  Result Value Ref Range   Cholesterol 171 0 - 200 mg/dL   Triglycerides 161 (H) <150 mg/dL  HDL 34 (L) >39 mg/dL   Total CHOL/HDL Ratio 5.0 RATIO   VLDL 42 (H) 0 - 40 mg/dL   LDL Cholesterol 95 0 - 99 mg/dL    Comment:        Total Cholesterol/HDL:CHD Risk Coronary Heart Disease Risk Table                     Men   Women  1/2 Average Risk   3.4   3.3  Average Risk       5.0   4.4  2 X Average Risk   9.6   7.1  3 X Average Risk  23.4   11.0        Use the calculated Patient Ratio above and the CHD Risk Table to determine the patient's CHD Risk.        ATP III CLASSIFICATION (LDL):  <100     mg/dL   Optimal  086-578  mg/dL   Near or Above                    Optimal  130-159  mg/dL   Borderline  469-629  mg/dL   High  >528     mg/dL   Very High Performed at [facility]   Glucose, capillary     Status: None   Collection Time: 03/02/14  9:08 PM  Result Value Ref Range   Glucose-Capillary 87 70 - 99 mg/dL  Glucose, capillary     Status: Abnormal   Collection Time: 03/03/14  6:06 AM  Result Value Ref Range   Glucose-Capillary 113 (H) 70 - 99 mg/dL  Glucose, capillary      Status: Abnormal   Collection Time: 03/03/14  5:15 PM  Result Value Ref Range   Glucose-Capillary 138 (H) 70 - 99 mg/dL   Comment 1 Notify RN   Glucose, capillary     Status: Abnormal   Collection Time: 03/03/14  9:22 PM  Result Value Ref Range   Glucose-Capillary 101 (H) 70 - 99 mg/dL   Comment 1 Notify RN   Glucose, capillary     Status: Abnormal   Collection Time: 03/04/14  6:09 AM  Result Value Ref Range   Glucose-Capillary 133 (H) 70 - 99 mg/dL   Comment 1 Notify RN   Glucose, capillary     Status: None   Collection Time: 03/04/14  4:38 PM  Result Value Ref Range   Glucose-Capillary 97 70 - 99 mg/dL   Comment 1 Documented in Chart    Comment 2 Notify RN   Glucose, capillary     Status: Abnormal   Collection Time: 03/05/14 11:56 AM  Result Value Ref Range   Glucose-Capillary 108 (H) 70 - 99 mg/dL    Physical Findings: AIMS: Facial and Oral Movements Muscles of Facial Expression: None, normal Lips and Perioral Area: None, normal Jaw: None, normal Tongue: None, normal,Extremity Movements Upper (arms, wrists, hands, fingers): None, normal Lower (legs, knees, ankles, toes): None, normal, Trunk Movements Neck, shoulders, hips: None, normal, Overall Severity Severity of abnormal movements (highest score from questions above): None, normal Incapacitation due to abnormal movements: None, normal Patient's awareness of abnormal movements (rate only patient's report): No Awareness, Dental Status Current problems with teeth and/or dentures?: No Does patient usually wear dentures?: No  CIWA:  CIWA-Ar Total: 0 COWS:     Psychiatric Specialty Exam: See Psychiatric Specialty Exam and Suicide Risk Assessment completed by Attending Physician prior to discharge.  Discharge  destination:  Home  Is patient on multiple antipsychotic therapies at discharge:  No   Has Patient had three or more failed trials of antipsychotic monotherapy by history:  No  Recommended Plan for Multiple  Antipsychotic Therapies: NA    Medication List    STOP taking these medications        OLANZapine 15 MG tablet  Commonly known as:  ZYPREXA     thiamine 100 MG tablet      TAKE these medications      Indication   acamprosate 333 MG tablet  Commonly known as:  CAMPRAL  Take 2 tablets (666 mg total) by mouth 3 (three) times daily with meals.   Indication:  Excessive Use of Alcohol     albuterol 108 (90 BASE) MCG/ACT inhaler  Commonly known as:  PROVENTIL HFA;VENTOLIN HFA  Inhale 1 puff into the lungs 2 (two) times daily as needed for wheezing or shortness of breath.   Indication:  Asthma     amLODipine-valsartan 5-160 MG per tablet  Commonly known as:  EXFORGE  Take 1 tablet by mouth daily. For high blood pressure   Indication:  High Blood Pressure     benztropine 1 MG tablet  Commonly known as:  COGENTIN  Take 1 tablet (1 mg total) by mouth daily. For prevention of drug induced movements   Indication:  Extrapyramidal Reaction caused by Medications     folic acid 1 MG tablet  Commonly known as:  FOLVITE  Take 1 tablet (1 mg total) by mouth daily. For low folate   Indication:  Deficiency of Folic Acid in the Diet     metFORMIN 500 MG tablet  Commonly known as:  GLUCOPHAGE  Take 1 tablet (500 mg total) by mouth daily with breakfast. For diabetes management   Indication:  Type 2 Diabetes     multivitamin with minerals Tabs tablet  Take 1 tablet by mouth daily. For low vitamin   Indication:  Low vitamin     nicotine 21 mg/24hr patch  Commonly known as:  NICODERM CQ - dosed in mg/24 hours  Place 1 patch (21 mg total) onto the skin daily. For nicotine addiction   Indication:  Nicotine Addiction     oxyCODONE-acetaminophen 5-325 MG per tablet  Commonly known as:  PERCOCET/ROXICET  Take 2 tablets by mouth every 4 (four) hours as needed for severe pain (pain.).   Indication:  Pain, Severe pain     pantoprazole 40 MG tablet  Commonly known as:  PROTONIX  Take 1  tablet (40 mg total) by mouth 2 (two) times daily. For acid reflux   Indication:  Erosive Esophagitis with Gastroesophageal Reflux     pindolol 5 MG tablet  Commonly known as:  VISKEN  Take 1 tablet (5 mg total) by mouth 2 (two) times daily. For high blood pressure   Indication:  High Blood Pressure     risperidone 4 MG tablet  Commonly known as:  RISPERDAL  Take 1 tablet (4 mg total) by mouth 2 (two) times daily. For mood control   Indication:  Mood control     sucralfate 1 G tablet  Commonly known as:  CARAFATE  Take 1 tablet (1 g total) by mouth 4 (four) times daily. For acid reflux   Indication:  Gastroesophageal Reflux Disease       Follow-up Information    Follow up with [facility] On 03/05/2014.   Why:  Paitent will be pick up fo discharge today.  Contact information:   [address] [place], [place]   [address]  [phone]     Follow-up recommendations: Activity:  As tolerated Diet: As recommended by your primary care doctor. Keep all scheduled follow-up appointments as recommended.    Comments:  Take all your medications as prescribed by your mental healthcare provider. Report any adverse effects and or reactions from your medicines to your outpatient provider promptly. Patient is instructed and cautioned to not engage in alcohol and or illegal drug use while on prescription medicines. In the event of worsening symptoms, patient is instructed to call the crisis hotline, 911 and or go to the nearest ED for appropriate evaluation and treatment of symptoms. Follow-up with your primary care provider for your other medical issues, concerns and or health care needs.   Total Discharge Time:  Greater than 30 minutes.  Signed: Sanjuana Kava, PMHNp-BC 03/05/2014, 12:29 PM  I personally assessed the patient and formulated the plan Madie Reno A. Dub Mikes, M.D.

## 2014-03-05 NOTE — Progress Notes (Signed)
Pt was discharged home today.  He denied any S/I H/I or A/V hallucinations.  He was given f/u appointment, rx, sample medications, and hotline info booklet.  He voiced understanding to all instructions provided.  He declined the need for smoking cessation materials. Pt's ACT team here to take pt home.

## 2014-03-05 NOTE — Progress Notes (Signed)
Patient ID: NAJEH CREDIT, male   DOB: 1965-07-04, 48 y.o.   MRN: [id number] PER STATE REGULATIONS 482.30  THIS CHART WAS REVIEWED FOR MEDICAL NECESSITY WITH RESPECT TO THE PATIENT'S ADMISSION/ DURATION OF STAY.  NEXT REVIEW DATE: 03/09/2014  Chauncy Lean, RN, BSN CASE MANAGER

## 2014-03-05 NOTE — Plan of Care (Signed)
Problem: Alteration in mood & ability to function due to Goal: STG-Patient will attend groups Outcome: Progressing Patient attended AA group this evening

## 2014-03-05 NOTE — Plan of Care (Signed)
Problem: Alteration in mood Goal: LTG-Pt's behavior demonstrates decreased signs of depression Goal met. Patient is rating depression at one. He rated depression at five or more on admission. Goal is for depression to be rated at three or below on discharge.  . , LCSW 1:12 PM    (Patient's behavior demonstrates decreased signs of depression to the point the patient is safe to return home and continue treatment in an outpatient setting)  Outcome: Completed/Met Date Met:  03/05/14     

## 2014-03-05 NOTE — Plan of Care (Signed)
Problem: Ineffective individual coping Goal: STG: Patient will remain free from self harm Outcome: Completed/Met Date Met:  03/05/14 Pt denies any S/I on his self-inventory and is planning to leave this afternoon.

## 2014-03-05 NOTE — Plan of Care (Signed)
Problem: Ineffective individual coping Goal: STG: Patient will participate in after care plan Patient has attended groups and easily engaged in discussions. Outpatient follow up is scheduled with [facility].  Joette Catching, LCSW Clinical Social Worker [phone]  03/05/2014 1:15 PM Outcome: Completed/Met Date Met:  03/05/14

## 2014-03-05 NOTE — [facility] Suicide Risk Assessment (Signed)
Suicide Risk Assessment  Discharge Assessment     Demographic Factors:  Male  Total Time spent with patient: 30 minutes  Psychiatric Specialty Exam:     Blood pressure 105/80, pulse 102, temperature 97.8 F (36.6 C), temperature source Oral, resp. rate 24, height 5\' 6"  (1.676 m), weight 95.255 kg (210 lb).Body mass index is 33.91 kg/(m^2).  General Appearance: Fairly Groomed  Engineer, water::  Fair  Speech:  Clear and Coherent  Volume:  Normal  Mood:  Euthymic  Affect:  Appropriate  Thought Process:  Coherent and Goal Directed  Orientation:  Full (Time, Place, and Person)  Thought Content:  plans as he moves on, relapse prevention plan  Suicidal Thoughts:  No  Homicidal Thoughts:  No  Memory:  Immediate;   Fair Recent;   Fair Remote;   Fair  Judgement:  Fair  Insight:  Present  Psychomotor Activity:  Normal  Concentration:  Fair  Recall:  [employer] of Knowledge:NA  Language: Fair  Akathisia:  No  Handed:    AIMS (if indicated):     Assets:  Desire for Improvement Housing Social Support  Sleep:  Number of Hours: 6    Musculoskeletal: Strength & Muscle Tone: within normal limits Gait & Station: normal Patient leans: N/A   Mental Status Per Nursing Assessment::   On Admission:     Current Mental Status by Physician: In full contact with reality. There are no active SI plans or intent. He is insightful in that all the events that led him to be admitted were triggered by his drinking. States he is committed to abstinence. He is going to continue follow up with the ACT team.    Loss Factors: NA  Historical Factors: NA  Risk Reduction Factors:   Positive social support and Positive therapeutic relationship  Continued Clinical Symptoms:  Depression:   Comorbid alcohol abuse/dependence Alcohol/Substance Abuse/Dependencies  Cognitive Features That Contribute To Risk:  Closed-mindedness Polarized thinking Thought constriction (tunnel vision)    Suicide  Risk:  Minimal: No identifiable suicidal ideation.  Patients presenting with no risk factors but with morbid ruminations; may be classified as minimal risk based on the severity of the depressive symptoms  Discharge Diagnoses:   AXIS I:  Alcohol Dependence, S/P intoxication, Cocaine abuse, Major Depression recurrent, PTSD AXIS II:  No diagnosis AXIS III:   Past Medical History  Diagnosis Date  . Hypertension   . Alcoholism     pt is also bi polar  . Stroke 05/15/2010    pt states stroke in 2011  . Schizophrenic disorder    AXIS IV:  other psychosocial or environmental problems AXIS V:  61-70 mild symptoms  Plan Of Care/Follow-up recommendations:  Activity:  as tolerated Diet:  as per nutritionist Follow up with the ACT team Is patient on multiple antipsychotic therapies at discharge:  No   Has Patient had three or more failed trials of antipsychotic monotherapy by history:  No  Recommended Plan for Multiple Antipsychotic Therapies: NA    Xiara Knisley A 03/05/2014, 9:58 AM

## 2014-03-07 NOTE — Progress Notes (Signed)
Patient Discharge Instructions:  After Visit Summary (AVS):   Faxed to:  03/07/14 Discharge Summary Note:   Faxed to:  03/07/14 Psychiatric Admission Assessment Note:   Faxed to:  03/07/14 Suicide Risk Assessment - Discharge Assessment:   Faxed to:  03/07/14 Faxed/Sent to the Next Level Care provider:  03/07/14 Faxed to [facility], 03/07/2014, 3:58 PM

## 2014-03-09 LAB — GLUCOSE, CAPILLARY
GLUCOSE-CAPILLARY: 151 mg/dL — AB (ref 70–99)
GLUCOSE-CAPILLARY: 123 mg/dL — AB (ref 70–99)

## 2014-04-10 DIAGNOSIS — G8929 Other chronic pain: Secondary | ICD-10-CM | POA: Diagnosis not present

## 2014-04-10 DIAGNOSIS — E119 Type 2 diabetes mellitus without complications: Secondary | ICD-10-CM | POA: Diagnosis not present

## 2014-04-10 DIAGNOSIS — I10 Essential (primary) hypertension: Secondary | ICD-10-CM | POA: Diagnosis not present

## 2014-04-10 DIAGNOSIS — M25572 Pain in left ankle and joints of left foot: Secondary | ICD-10-CM | POA: Diagnosis not present

## 2014-04-17 ENCOUNTER — Ambulatory Visit (INDEPENDENT_AMBULATORY_CARE_PROVIDER_SITE_OTHER): Payer: Medicare Other | Admitting: Family Medicine

## 2014-04-17 VITALS — BP 100/70 | HR 81 | Temp 97.6°F | Resp 16 | Ht 67.5 in | Wt 212.4 lb

## 2014-04-17 DIAGNOSIS — G8929 Other chronic pain: Secondary | ICD-10-CM

## 2014-04-17 DIAGNOSIS — F1491 Cocaine use, unspecified, in remission: Secondary | ICD-10-CM

## 2014-04-17 DIAGNOSIS — Z87898 Personal history of other specified conditions: Secondary | ICD-10-CM

## 2014-04-17 DIAGNOSIS — F1011 Alcohol abuse, in remission: Secondary | ICD-10-CM

## 2014-04-17 DIAGNOSIS — F1410 Cocaine abuse, uncomplicated: Secondary | ICD-10-CM

## 2014-04-17 DIAGNOSIS — R7309 Other abnormal glucose: Secondary | ICD-10-CM | POA: Diagnosis not present

## 2014-04-17 DIAGNOSIS — R7303 Prediabetes: Secondary | ICD-10-CM

## 2014-04-17 DIAGNOSIS — K2970 Gastritis, unspecified, without bleeding: Secondary | ICD-10-CM | POA: Diagnosis not present

## 2014-04-17 DIAGNOSIS — F1010 Alcohol abuse, uncomplicated: Secondary | ICD-10-CM

## 2014-04-17 DIAGNOSIS — K2990 Gastroduodenitis, unspecified, without bleeding: Secondary | ICD-10-CM

## 2014-04-17 DIAGNOSIS — M25572 Pain in left ankle and joints of left foot: Secondary | ICD-10-CM | POA: Diagnosis not present

## 2014-04-17 DIAGNOSIS — R197 Diarrhea, unspecified: Secondary | ICD-10-CM | POA: Diagnosis not present

## 2014-04-17 LAB — COMPREHENSIVE METABOLIC PANEL
ALT: 9 U/L (ref 0–53)
AST: 32 U/L (ref 0–37)
Albumin: 4.4 g/dL (ref 3.5–5.2)
Alkaline Phosphatase: 104 U/L (ref 39–117)
BUN: 6 mg/dL (ref 6–23)
CHLORIDE: 101 meq/L (ref 96–112)
CO2: 22 meq/L (ref 19–32)
Calcium: 9.5 mg/dL (ref 8.4–10.5)
Creat: 0.93 mg/dL (ref 0.50–1.35)
GLUCOSE: 97 mg/dL (ref 70–99)
Potassium: 4.5 mEq/L (ref 3.5–5.3)
Sodium: 134 mEq/L — ABNORMAL LOW (ref 135–145)
Total Bilirubin: 0.5 mg/dL (ref 0.2–1.2)
Total Protein: 7.4 g/dL (ref 6.0–8.3)

## 2014-04-17 LAB — POCT CBC
Granulocyte percent: 59.7 %G (ref 37–80)
HEMATOCRIT: 41.2 % — AB (ref 43.5–53.7)
HEMOGLOBIN: 13.6 g/dL — AB (ref 14.1–18.1)
Lymph, poc: 2.8 (ref 0.6–3.4)
MCH, POC: 30.6 pg (ref 27–31.2)
MCHC: 32.9 g/dL (ref 31.8–35.4)
MCV: 92.9 fL (ref 80–97)
MID (cbc): 0.6 (ref 0–0.9)
MPV: 8.3 fL (ref 0–99.8)
POC Granulocyte: 5 (ref 2–6.9)
POC LYMPH %: 33.6 % (ref 10–50)
POC MID %: 6.7 %M (ref 0–12)
Platelet Count, POC: 203 10*3/uL (ref 142–424)
RBC: 4.43 M/uL — AB (ref 4.69–6.13)
RDW, POC: 15.1 %
WBC: 8.4 10*3/uL (ref 4.6–10.2)

## 2014-04-17 LAB — URIC ACID: URIC ACID, SERUM: 5.2 mg/dL (ref 4.0–7.8)

## 2014-04-17 LAB — POCT SEDIMENTATION RATE: POCT SED RATE: 15 mm/hr (ref 0–22)

## 2014-04-17 LAB — GLUCOSE, POCT (MANUAL RESULT ENTRY): POC Glucose: 111 mg/dl — AB (ref 70–99)

## 2014-04-17 MED ORDER — GLUCOSAMINE-CHONDROITIN 500-250 MG PO CAPS
1.0000 | ORAL_CAPSULE | Freq: Two times a day (BID) | ORAL | Status: DC
Start: 2014-04-17 — End: 2015-02-18

## 2014-04-17 MED ORDER — HYDROCODONE-ACETAMINOPHEN 5-325 MG PO TABS: 1.0000 | ORAL_TABLET | Freq: Every evening | ORAL | Status: DC | PRN

## 2014-04-17 NOTE — Patient Instructions (Signed)
Chronic Ankle Instability with Rehab Chronic ankle instability is characterized by instability of the ankle for a prolonged period of time. There are two types of ankle instability.   A functionally unstable ankle is one that gives way; however, it may or may not be loose.  A mechanically unstable ankle is one that is loose due to a problem with the ligaments. However, not all loose ankles are unstable or give way. SYMPTOMS   Recurrent ankle pain and giving way of the ankle.  Difficulty running on uneven surfaces, jumping, or changing directions while running (cutting).  Pain, tenderness, swelling, and bruising at the site of injury.  Weakness or looseness in the ankle joint.  Occasionally, impaired ability to walk soon after injury. CAUSES   Ankle instability is most commonly caused by a previous ankle injury that did not completely heal.  Ankle instability may also be caused by stress imposed from either side of the ankle joint that can temporarily force or pry the ankle bone (talus) out of its normal alignment. The ligaments that hold the joint in place are stretched and torn. RISK INCREASES WITH:  Previous ankle injury.  You were born with (congenital) joint looseness.  Too-rapid return to activity after previous ankle sprain.  Activities in which the foot may land sideways while running, walking, and jumping (basketball, volleyball, or soccer) or walking or running on uneven or rough surfaces.  Inadequate ankle support during athletics.  Poor strength and flexibility.  Poor balance skills. PREVENTION  Warm up and stretch properly before activity.  Maintain physical fitness:  Ankle and leg flexibility, muscle strength, and endurance.  Balanced training activities.  Cardiovascular fitness.  Learn and use proper technique during sports and have a coach correct improper technique.  Taping, protective strapping, bracing, or high-top tennis shoes may be used.  Initially, tape is best; however, it loses most of its support function within 10 to 15 minutes.  Wear proper protective shoes (high-top shoes with taping or bracing).  Provide the ankle with support during sports and practice activities for 12 months following injury.  Complete rehabilitation after initial injury. PROGNOSIS  If treated properly, ankle instability normally resolves with non-surgical treatment. However, for certain cases of mechanical instability surgery is necessary. RELATED COMPLICATIONS   Frequent recurrence of symptoms is possible. Following rehabilitation guidelines correctly decreases the frequency of recurrence and optimizes healing time.  Injury to other structures (bone, cartilage, or tendon).  Chronically unstable or arthritic ankle joint.  Complications of surgery including infection, bleeding, injury to nerves, continued giving way, ankle stiffness, and ankle weakness. TREATMENT Treatment initially involves ice, medication, and compression bandages are used to help reduce pain and inflammation, It may be necessary to immobilize the joint for a period of time to allow for healing. Strengthening and stretching exercises are recommended after immobilization to help regain strength and flexibility. These exercises may be completed at home or with a therapist. Some individuals find placing a heel wedge in the shoe, taping or bracing, and wearing high-top shoes helpful. If symptoms last for longer than 3 months, despite treatment, then surgery may be recommended. HEAT AND COLD  Cold treatment (icing) relieves pain and reduces inflammation. Cold treatment should be applied for 10 to 15 minutes every 2 to 3 hours for inflammation and pain and immediately after any activity that aggravates your symptoms. Use ice packs or an ice massage.  Heat treatment may be used prior to performing the stretching and strengthening activities prescribed by your caregiver, physical  therapist, or athletic trainer. Use a heat pack or a warm soak. MEDICATION   There are no specific medications to improve the stability of your ankle.  If pain medication is necessary, then nonsteroidal anti-inflammatory medications, such as aspirin and ibuprofen, or other minor pain relievers, such as acetaminophen, are often recommended.  Do not take pain medication within 7 days before surgery.  Prescription pain relievers may be prescribed if deemed necessary by your caregiver. Use only as directed and only as much as you need.  Ointments applied to the skin may be helpful. SEEK MEDICAL CARE IF:   Pain, swelling, or bruising worsens despite treatment.  You develop locking or catching in the ankle.  You have pain, numbness, or coldness in the foot.  You develop giving way of the ankle which persists after 3 to 6 months of rehabilitation. EXERCISES  RANGE OF MOTION AND STRETCHING EXERCISES - Ankle Instability, Chronic, Non-Surgical Intervention Since ankles demonstrate instability when they have too much motion throughout the joints, range of motion and stretching exercises are not helpful and can even be harmful. Only complete range of motion and stretching exercises for your ankle if instructed by your physician, physical therapist or athletic trainer. An effective rehabilitation program for unstable ankles will include mostly strengthening and balance exercises. STRENGTHENING EXERCISES - Ankle Instability, Chronic, Non-Surgical Intervention  These exercises may help you when beginning to rehabilitate your injury. They may resolve your symptoms with or without further involvement from your physician, physical therapist or athletic trainer. While completing these exercises, remember:  Muscles can gain both the endurance and the strength needed for everyday activities through controlled exercises.  Complete these exercises as instructed by your physician, physical therapist or  athletic trainer. Progress the resistance and repetitions only as guided.  You may experience muscle soreness or fatigue, but the pain or discomfort you are trying to eliminate should never worsen during these exercises. If this pain does worsen, stop and make certain you are following the directions exactly. If the pain is still present after adjustments, discontinue the exercise until you can discuss the trouble with your clinician. STRENGTH - Dorsiflexors  Secure a rubber exercise band/tubing to a fixed object (table, pole) and loop the other end around your right / left foot.  Sit on the floor facing the fixed object. The band/tubing should be slightly tense when your foot is relaxed.  Slowly draw your foot back toward you using your ankle and toes.  Hold this position for __________ seconds. Slowly release the tension in the band and return your foot to the starting position. Repeat __________ times. Complete this exercise __________ times per day.  STRENGTH - Plantar-flexors  Sit with your right / left leg extended. Holding onto both ends of a rubber exercise band/tubing, loop it around the ball of your foot. Keep a slight tension in the band.  Slowly push your toes away from you, pointing them downward.  Hold this position for __________ seconds. Return slowly, controlling the tension in the band/tubing. Repeat __________ times. Complete this exercise __________ times per day.  STRENGTH - Plantar-flexors, Standing   Stand with your feet shoulder width apart. Steady yourself with a wall or table using as little support as needed.  Keeping your weight evenly spread over the width of your feet, rise up on your toes.*  Hold this position for __________ seconds. Repeat __________ times. Complete this exercise __________ times per day.  *If this is too easy, shift your weight toward your  right / left leg until you feel challenged. Ultimately, you may be asked to do this exercise with  your right / left foot only. STRENGTH - Ankle Eversion  Secure one end of a rubber exercise band/tubing to a fixed object (table, pole). Loop the other end around your foot just before your toes.  Place your fists between your knees. This will focus your strengthening at your ankle.  Drawing the band/tubing across your opposite foot, slowly, pull your little toe out and up. Make sure the band/tubing is positioned to resist the entire motion.  Hold this position for __________ seconds.  Have your muscles resist the band/tubing as it slowly pulls your foot back to the starting position. Repeat __________ times. Complete this exercise __________ times per day.  STRENGTH - Ankle Inversion  Secure one end of a rubber exercise band/tubing to a fixed object (table, pole). Loop the other end around your foot just before your toes.  Place your fists between your knees. This will focus your strengthening at your ankle.  Slowly, pull your big toe up and in, making sure the band/tubing is positioned to resist the entire motion.  Hold this position for __________ seconds.  Have your muscles resist the band/tubing as it slowly pulls your foot back to the starting position. Repeat __________ times. Complete this exercises __________ times per day.  STRENGTH - Towel Curls  Sit in a chair positioned on a non-carpeted surface.  Place your foot on a towel, keeping your heel on the floor.  Pull the towel toward your heel by only curling your toes. Keep your heel on the floor.  If instructed by your physician, physical therapist or athletic trainer, add weight to the end of the towel. Repeat __________ times. Complete this exercise __________ times per day. STRENGTH - Dorsiflexors and Plantar-flexors, Heel/toe Walking  Dorsiflexion: Walk on your heels only. Keep your toes as high as possible.  Repeat __________ times. Complete __________ times per day.  Plantar flexion: Walk on your toes only.  Keep your heels as high as possible.  Walk for ____________________ seconds/feet. Repeat __________ times. Complete __________ times per day.  BALANCE - Tandem Walking  Place your uninjured foot on a line 2-4 inches wide and at least 10 feet long.  Keeping your balance without using anything for extra support, place your right / left heel directly in front of your other foot.  Slowly raise your back foot up, lifting from the heel to the toes, and place it directly in front of the right / left foot.  Continue to walk along the line slowly. Walk for ____________________ feet. Repeat ____________________ times. Complete ____________________ times per day. BALANCE - Inversion/Eversion Use caution, these are advanced level exercises. Do not begin them until you are advised to do so.   Create a balance board using a sturdy board about 1  feet long and at 1-1  feet wide and a 1  inch diameter rod or pipe that is as long as the board's width. A copper pipe or a solid broomstick work well.  Stand on a non-carpeted surface near a countertop or wall. Step onto the board so that your feet are hip-width apart and equally straddle the rod/pipe.  Keeping your feet in place, complete these two exercises without shifting your upper body or hips:  Tip the board from side-to-side. Control the movement so the board does not forcefully strike the ground. The board should silently tap the ground.  Tip the board side-to-side  without striking the ground. Occasionally pause and maintain a steady position at various points.  Repeat the first two exercises, but use only your right / left foot. Place your right / left foot directly over the rod/pipe. Repeat __________ times. Complete this exercise __________ times a day. BALANCE - Plantar/Dorsi Flexion Use caution, these are advanced level exercises. Do not begin them until you are advised to do so.  Create a balance board using a sturdy board about 1  feet  long and at 1-1  feet wide and a 1  inch diameter rod or pipe that is as long as the board's width. A copper pipe or a solid broomstick work well.  Stand on a non-carpeted surface near a countertop or wall. Stand on the board so that the rod/pipe runs under the arches in your feet.  Keeping your feet in place, complete these two exercises without shifting your upper body or hips:  Tip the board from side-to-side. Control the movement so the board does not forcefully strike the ground. The board should silently tap the ground.  Tip the board side-to-side without striking the ground. Occasionally pause and maintain a steady position at various points.  Repeat the first two exercises, but use only your right / left foot. Stand in the center of the board. Repeat __________ times. Complete this exercise __________ times a day. STRENGTH - Plantar-flexors, Eccentric Note: This exercise can place a lot of stress on your foot and ankle. Please complete this exercise only if specifically instructed by your caregiver.   Place the balls of your feet on a step. With your hands, use only enough support from a wall or rail to keep your balance.  Keep your knees straight and rise up on your toes.  Slowly shift your weight entirely to your toes and pick up your opposite foot. Gently and with controlled movement, lower your weight through your right / left foot so that your heel drops below the level of the step. You will feel a slight stretch in the back of your calf at the ending position.  Use the healthy leg to help rise up onto the balls of both feet, then lower weight only on the right / left leg again. Build up to 15 repetitions. Then progress to 3 consecutive sets of 15 repetitions.*  After completing the above exercise, complete the same exercise with a slight knee bend (about 30 degrees). Again, build up to 15 repetitions. Then progress to 3 consecutive sets of 15 repetitions.* Perform this  exercise __________ times per day.  *When you easily complete 3 sets of 15, your physician, physical therapist or athletic trainer may advise you to add resistance by wearing a backpack filled with additional weight. Document Released: 10/15/2004 Document Revised: 06/08/2011 Document Reviewed: 06/28/2008 [facility] Patient Information 2015 [place], [place]. This information is not intended to replace advice given to you by your health care provider. Make sure you discuss any questions you have with your health care provider.

## 2014-04-17 NOTE — Progress Notes (Addendum)
Subjective:    Patient ID: [name], male    DOB: November 09, 1965, 49 y.o.   MRN: [id number] This chart was scribed for Norberto Sorenson, MD by Littie Deeds, Medical Scribe. This patient was seen in Room 3 and the patient's care was started at 11:13 AM.  Chief Complaint  Patient presents with  . Ankle Pain    left ankle pain; being having pain for awhile    HPI HPI Comments: [name] is a 49 y.o. male with a hx of pre-diabetes (HGB A1C 6.4) who presents to the [facility] complaining of gradual onset, gradually worsening, left ankle pain with swelling that started a few weeks ago.  LFTs last checked 6 weeks ago, which were normal. He had an upper GI bleed 4 months ago from EtOH use. Patient seen in ER in August, complaining of left ankle pain after injury during fall. He has an extensive history of left ankle injuries for years. He was told he sprained his ankle and put on Percocet prn, splint and instructed to follow up with orthopedics. Patient has recurrent hospitlazaitons for alcohol intoxication combined with SI/HI. XR findings: Frontal, oblique, and lateral views were obtained. There is evidence of old trauma in the lateral malleolar region with remodeling. There is evidence of old trauma in the distal tibiofibular syndesmosis with calcification. There is no acute fracture. There is soft tissue swelling with a joint effusion. There is moderate generalized osteoarthritic change. There is evidence of an old avulsion along the dorsal navicular with remodeling. There is a benign exostosis along the dorsal distal talus. The ankle mortise appears grossly intact. There is an inferior calcaneal spur.  Left Ankle Pain: Patient denies any recent injuries. The pain is worse with walking. He has been taking Tylenol, 4 at a time, for his pain. Patient has not taken Aleve or Advil. He feels as if his bones have locked up. Patient also reports having loose stools. Patient denies indigestion  and GERD symptoms. He states he has not had EtOH in 2-3 months. Patient states he takes vitamins at home.  No PCP per patient  Past Medical History  Diagnosis Date  . Hypertension   . Alcoholism     pt is also bi polar  . Stroke 05/15/2010    pt states stroke in 2011  . Schizophrenic disorder    Current Outpatient Prescriptions on File Prior to Visit  Medication Sig Dispense Refill  . acamprosate (CAMPRAL) 333 MG tablet Take 2 tablets (666 mg total) by mouth 3 (three) times daily with meals. 180 tablet 0  . albuterol (PROVENTIL HFA;VENTOLIN HFA) 108 (90 BASE) MCG/ACT inhaler Inhale 1 puff into the lungs 2 (two) times daily as needed for wheezing or shortness of breath.    Marland Kitchen amLODipine-valsartan (EXFORGE) 5-160 MG per tablet Take 1 tablet by mouth daily. For high blood pressure 30 tablet 0  . benztropine (COGENTIN) 1 MG tablet Take 1 tablet (1 mg total) by mouth daily. For prevention of drug induced movements 30 tablet 0  . folic acid (FOLVITE) 1 MG tablet Take 1 tablet (1 mg total) by mouth daily. For low folate 30 tablet 0  . metFORMIN (GLUCOPHAGE) 500 MG tablet Take 1 tablet (500 mg total) by mouth daily with breakfast. For diabetes management 30 tablet 0  . Multiple Vitamin (MULTIVITAMIN WITH MINERALS) TABS tablet Take 1 tablet by mouth daily. For low vitamin 30 tablet 0  . nicotine (NICODERM CQ - DOSED IN MG/24  HOURS) 21 mg/24hr patch Place 1 patch (21 mg total) onto the skin daily. For nicotine addiction 28 patch 0  . pantoprazole (PROTONIX) 40 MG tablet Take 1 tablet (40 mg total) by mouth 2 (two) times daily. For acid reflux 84 tablet 0  . pindolol (VISKEN) 5 MG tablet Take 1 tablet (5 mg total) by mouth 2 (two) times daily. For high blood pressure    . risperiDONE (RISPERDAL) 4 MG tablet Take 1 tablet (4 mg total) by mouth 2 (two) times daily. For mood control 60 tablet 0  . sucralfate (CARAFATE) 1 G tablet Take 1 tablet (1 g total) by mouth 4 (four) times daily. For acid reflux       No current facility-administered medications on file prior to visit.   Allergies  Allergen Reactions  . Aspirin Swelling     Review of Systems  Cardiovascular: Negative for chest pain.  Gastrointestinal: Positive for diarrhea.  Musculoskeletal: Positive for joint swelling and arthralgias.       Objective:  BP 100/70 mmHg  Pulse 81  Temp(Src) 97.6 F (36.4 C) (Oral)  Resp 16  Ht 5' 7.5" (1.715 m)  Wt 212 lb 6.4 oz (96.344 kg)  BMI 32.76 kg/m2  SpO2 100%  Physical Exam  Constitutional: He is oriented to person, place, and time. He appears well-developed and well-nourished. No distress.  HENT:  Head: Normocephalic and atraumatic.  Mouth/Throat: Oropharynx is clear and moist. No oropharyngeal exudate.  Eyes: Pupils are equal, round, and reactive to light.  Neck: Neck supple.  Cardiovascular: Normal rate.   Pulses:      Dorsalis pedis pulses are 2+ on the left side.       Posterior tibial pulses are 2+ on the left side.  Pulmonary/Chest: Effort normal.  Musculoskeletal:  Swelling along medial and lateral aspects of left ankle. Toenail fungus throughout. Large callous on first phalanx and tinea pedia present. Swelling is localized over ankle, does not progress over mid foot or extremity.  Neurological: He is alert and oriented to person, place, and time. No cranial nerve deficit.  Skin: Skin is warm and dry. No rash noted.  Hyperpigmented macule over medial epicondyle.  Psychiatric: He has a normal mood and affect. His behavior is normal.  Nursing note and vitals reviewed.    Results for orders placed or performed in visit on 04/17/14  POCT CBC  Result Value Ref Range   WBC 8.4 4.6 - 10.2 K/uL   Lymph, poc 2.8 0.6 - 3.4   POC LYMPH PERCENT 33.6 10 - 50 %L   MID (cbc) 0.6 0 - 0.9   POC MID % 6.7 0 - 12 %M   POC Granulocyte 5.0 2 - 6.9   Granulocyte percent 59.7 37 - 80 %G   RBC 4.43 (A) 4.69 - 6.13 M/uL   Hemoglobin 13.6 (A) 14.1 - 18.1 g/dL   HCT, POC 16.1 (A)  09.6 - 53.7 %   MCV 92.9 80 - 97 fL   MCH, POC 30.6 27 - 31.2 pg   MCHC 32.9 31.8 - 35.4 g/dL   RDW, POC 04.5 %   Platelet Count, POC 203 142 - 424 K/uL   MPV 8.3 0 - 99.8 fL  POCT glucose (manual entry)  Result Value Ref Range   POC Glucose 111 (A) 70 - 99 mg/dl    No results found.     Assessment & Plan:  Due to aspirin allergy and slight anemia with hx of GI bleed, will withhold  treatment of NSAID, even Cox-2 inhibitor at this time. Splinted with an aircast and will refer to orthopedics. Tylenol with prn Vicodin at night . Chronic pain of left ankle - Plan: POCT CBC, POCT SEDIMENTATION RATE, Uric acid, Ambulatory referral to Orthopedic Surgery - placed in air splint to aid in stability - h/o recurrent ankle injures that were not properly rehabed  Diarrhea - Plan: POCT CBC, POCT SEDIMENTATION RATE, Comprehensive metabolic panel  Gastritis and gastroduodenitis - Plan: POCT CBC, Comprehensive metabolic panel  History of alcohol abuse  History of cocaine use  Prediabetes - Plan: POCT CBC, POCT glucose (manual entry), Comprehensive metabolic panel  Meds ordered this encounter  Medications  . HYDROcodone-acetaminophen (NORCO/VICODIN) 5-325 MG per tablet    Sig: Take 1-2 tablets by mouth at bedtime as needed for moderate pain.    Dispense:  20 tablet    Refill:  0  . Glucosamine-Chondroitin (GLUCOSAMINE CHONDROITIN COMPLX) 500-250 MG CAPS    Sig: Take 1 tablet by mouth 2 (two) times daily.    Dispense:  60 each    Refill:  0    I personally performed the services described in this documentation, which was scribed in my presence. The recorded information has been reviewed and considered, and addended by me as needed.  Norberto Sorenson, MD MPH   Results for orders placed or performed in visit on 04/17/14  Comprehensive metabolic panel  Result Value Ref Range   Sodium 134 (L) 135 - 145 mEq/L   Potassium 4.5 3.5 - 5.3 mEq/L   Chloride 101 96 - 112 mEq/L   CO2 22 19 - 32 mEq/L    Glucose, Bld 97 70 - 99 mg/dL   BUN 6 6 - 23 mg/dL   Creat 4.54 0.98 - 1.19 mg/dL   Total Bilirubin 0.5 0.2 - 1.2 mg/dL   Alkaline Phosphatase 104 39 - 117 U/L   AST 32 0 - 37 U/L   ALT 9 0 - 53 U/L   Total Protein 7.4 6.0 - 8.3 g/dL   Albumin 4.4 3.5 - 5.2 g/dL   Calcium 9.5 8.4 - 14.7 mg/dL  Uric acid  Result Value Ref Range   Uric Acid, Serum 5.2 4.0 - 7.8 mg/dL  POCT CBC  Result Value Ref Range   WBC 8.4 4.6 - 10.2 K/uL   Lymph, poc 2.8 0.6 - 3.4   POC LYMPH PERCENT 33.6 10 - 50 %L   MID (cbc) 0.6 0 - 0.9   POC MID % 6.7 0 - 12 %M   POC Granulocyte 5.0 2 - 6.9   Granulocyte percent 59.7 37 - 80 %G   RBC 4.43 (A) 4.69 - 6.13 M/uL   Hemoglobin 13.6 (A) 14.1 - 18.1 g/dL   HCT, POC 82.9 (A) 56.2 - 53.7 %   MCV 92.9 80 - 97 fL   MCH, POC 30.6 27 - 31.2 pg   MCHC 32.9 31.8 - 35.4 g/dL   RDW, POC 13.0 %   Platelet Count, POC 203 142 - 424 K/uL   MPV 8.3 0 - 99.8 fL  POCT glucose (manual entry)  Result Value Ref Range   POC Glucose 111 (A) 70 - 99 mg/dl  POCT SEDIMENTATION RATE  Result Value Ref Range   POCT SED RATE 15 0 - 22 mm/hr

## 2014-04-19 ENCOUNTER — Encounter: Payer: Self-pay | Admitting: Family Medicine

## 2014-05-17 ENCOUNTER — Encounter: Payer: Self-pay | Admitting: Family Medicine

## 2014-05-17 ENCOUNTER — Ambulatory Visit (INDEPENDENT_AMBULATORY_CARE_PROVIDER_SITE_OTHER): Payer: Medicare Other | Admitting: Family Medicine

## 2014-05-17 VITALS — BP 90/60 | HR 98 | Temp 98.2°F | Resp 12 | Ht 67.0 in | Wt 212.0 lb

## 2014-05-17 DIAGNOSIS — G894 Chronic pain syndrome: Secondary | ICD-10-CM

## 2014-05-17 DIAGNOSIS — M129 Arthropathy, unspecified: Secondary | ICD-10-CM | POA: Diagnosis not present

## 2014-05-17 DIAGNOSIS — E11649 Type 2 diabetes mellitus with hypoglycemia without coma: Secondary | ICD-10-CM | POA: Diagnosis not present

## 2014-05-17 DIAGNOSIS — F17200 Nicotine dependence, unspecified, uncomplicated: Secondary | ICD-10-CM

## 2014-05-17 DIAGNOSIS — Z720 Tobacco use: Secondary | ICD-10-CM | POA: Diagnosis not present

## 2014-05-17 DIAGNOSIS — M19072 Primary osteoarthritis, left ankle and foot: Secondary | ICD-10-CM

## 2014-05-17 LAB — POCT GLYCOSYLATED HEMOGLOBIN (HGB A1C): HEMOGLOBIN A1C: 5.4

## 2014-05-17 MED ORDER — HYDROCODONE-ACETAMINOPHEN 5-325 MG PO TABS: 1.0000 | ORAL_TABLET | Freq: Every evening | ORAL | Status: DC | PRN

## 2014-05-17 NOTE — Progress Notes (Signed)
This chart was scribed for Norberto Sorenson, MD by Milly Jakob, ED Scribe. The patient was seen in room 8. Patient's care was started at 2:12 PM.  Subjective:   Patient ID: [name], male    DOB: [birth year], 49 y.o.   MRN: [id number]  Chief Complaint  Patient presents with  . Ankle Pain    Left ankle pain re check.   Pain worse at times with increased walking  . Follow-up    HPI  HPI Comments: [name] is a 49 y.o. male who presents to the [facility] to follow up with his ankle pain, and he is presenting with hypotension. He denies chest pian, SOB, and lightheadedness. He denies any changes in medication. He reports taking Tylenol at home during the day for his Ankle. He denies abdominal pian. He reports intermittent, aching, pian in his left ankle. He denies urinary or bowel symptoms. He states that his BS has been running 89-90 in the mornings. He reports eating a candy bar or drinking soda if his BS drops during the day. He smokes 1 PPD. He does not drink alcohol.  He last ate at 12 PM today. He has been receiving monthly pain medication from Dr. Elwyn Lade in [place]. He filled 60 tabs of tramadol 60 from Dr. Gwenyth Bouillon the same day he filled 20 tabs of Vicodin from me.   Past Medical History  Diagnosis Date  . Hypertension   . Alcoholism     pt is also bi polar  . Stroke 05/15/2010    pt states stroke in 2011  . Schizophrenic disorder    Current Outpatient Prescriptions on File Prior to Visit  Medication Sig Dispense Refill  . acamprosate (CAMPRAL) 333 MG tablet Take 2 tablets (666 mg total) by mouth 3 (three) times daily with meals. 180 tablet 0  . albuterol (PROVENTIL HFA;VENTOLIN HFA) 108 (90 BASE) MCG/ACT inhaler Inhale 1 puff into the lungs 2 (two) times daily as needed for wheezing or shortness of breath.    Marland Kitchen amLODipine-valsartan (EXFORGE) 5-160 MG per tablet Take 1 tablet by mouth daily. For high blood pressure 30 tablet 0  . benztropine  (COGENTIN) 1 MG tablet Take 1 tablet (1 mg total) by mouth daily. For prevention of drug induced movements 30 tablet 0  . folic acid (FOLVITE) 1 MG tablet Take 1 tablet (1 mg total) by mouth daily. For low folate 30 tablet 0  . Glucosamine-Chondroitin (GLUCOSAMINE CHONDROITIN COMPLX) 500-250 MG CAPS Take 1 tablet by mouth 2 (two) times daily. 60 each 0  . metFORMIN (GLUCOPHAGE) 500 MG tablet Take 1 tablet (500 mg total) by mouth daily with breakfast. For diabetes management 30 tablet 0  . Multiple Vitamin (MULTIVITAMIN WITH MINERALS) TABS tablet Take 1 tablet by mouth daily. For low vitamin 30 tablet 0  . pantoprazole (PROTONIX) 40 MG tablet Take 1 tablet (40 mg total) by mouth 2 (two) times daily. For acid reflux 84 tablet 0  . pindolol (VISKEN) 5 MG tablet Take 1 tablet (5 mg total) by mouth 2 (two) times daily. For high blood pressure    . risperiDONE (RISPERDAL) 4 MG tablet Take 1 tablet (4 mg total) by mouth 2 (two) times daily. For mood control 60 tablet 0  . sucralfate (CARAFATE) 1 G tablet Take 1 tablet (1 g total) by mouth 4 (four) times daily. For acid reflux    . HYDROcodone-acetaminophen (NORCO/VICODIN) 5-325 MG per tablet Take 1-2 tablets by mouth  at bedtime as needed for moderate pain. (Patient not taking: Reported on 05/17/2014) 20 tablet 0  . nicotine (NICODERM CQ - DOSED IN MG/24 HOURS) 21 mg/24hr patch Place 1 patch (21 mg total) onto the skin daily. For nicotine addiction (Patient not taking: Reported on 05/17/2014) 28 patch 0   No current facility-administered medications on file prior to visit.   Allergies  Allergen Reactions  . Aspirin Swelling    Throat swelling      Review of Systems  Constitutional: Negative for fever, chills and fatigue.  HENT: Negative for congestion and sore throat.   Respiratory: Negative for shortness of breath.   Cardiovascular: Negative for chest pain.  Gastrointestinal: Negative for abdominal pain, diarrhea and constipation.  Genitourinary:  Negative for dysuria, urgency, frequency and hematuria.  Musculoskeletal: Positive for arthralgias (left Ankle).  Neurological: Negative for weakness and numbness.    BP 90/60 mmHg  Pulse 98  Temp(Src) 98.2 F (36.8 C) (Oral)  Resp 12  Ht 5\' 7"  (1.702 m)  Wt 212 lb (96.163 kg)  BMI 33.20 kg/m2  SpO2 99%  Objective:  Physical Exam  Constitutional: He is oriented to person, place, and time. He appears well-developed and well-nourished. No distress.  HENT:  Head: Normocephalic and atraumatic.  Eyes: Conjunctivae and EOM are normal. Pupils are equal, round, and reactive to light.  Neck: Neck supple. No tracheal deviation present.  Cardiovascular: Regular rhythm, S1 normal, S2 normal and normal heart sounds.  Tachycardia present.   No murmur heard. Pulmonary/Chest: Effort normal and breath sounds normal. No respiratory distress. He has no wheezes. He has no rales. He exhibits no tenderness.  Musculoskeletal: Normal range of motion.  Neurological: He is alert and oriented to person, place, and time.  Skin: Skin is warm and dry.  Psychiatric: He has a normal mood and affect. His behavior is normal.  Nursing note and vitals reviewed. Negative Orthostatics   Assessment & Plan:   2:28 PM- I spoke with the nurse of doctor Tammy Boid at [facility]. They have not seen Mr. Tally since I saw him last month and they do not have a pain contract with him and they have not written him any additional pain medications. He last had blood work through them in October. He does not have any appointment scheduled for 3 months or medication refill medication requests.    Chronic pain syndrome - needs f/u OV for any further refills.  Arthritis of left ankle - Pt reminded to keep his [facility] appt March 9 at 8:15 with Dr. Victorino Dike   Tobacco use disorder  Type 2 diabetes mellitus with hypoglycemia without coma - Plan: POCT glycosylated hemoglobin (Hb A1C)  Meds ordered this encounter   Medications  . HYDROcodone-acetaminophen (NORCO/VICODIN) 5-325 MG per tablet    Sig: Take 1 tablet by mouth at bedtime as needed for moderate pain.    Dispense:  30 tablet    Refill:  0    I personally performed the services described in this documentation, which was scribed in my presence. The recorded information has been reviewed and considered, and addended by me as needed.  Norberto Sorenson, MD MPH

## 2014-05-17 NOTE — Patient Instructions (Addendum)
[facility] appt March 9 at 8:15 with Dr. Doran Durand

## 2014-06-06 DIAGNOSIS — M19172 Post-traumatic osteoarthritis, left ankle and foot: Secondary | ICD-10-CM | POA: Diagnosis not present

## 2014-06-14 DIAGNOSIS — E119 Type 2 diabetes mellitus without complications: Secondary | ICD-10-CM | POA: Diagnosis not present

## 2014-06-16 ENCOUNTER — Ambulatory Visit (INDEPENDENT_AMBULATORY_CARE_PROVIDER_SITE_OTHER): Payer: Medicare Other | Admitting: Family Medicine

## 2014-06-16 VITALS — BP 118/82 | HR 77 | Temp 98.3°F | Ht 67.0 in | Wt 208.0 lb

## 2014-06-16 DIAGNOSIS — Z720 Tobacco use: Secondary | ICD-10-CM | POA: Diagnosis not present

## 2014-06-16 DIAGNOSIS — M25572 Pain in left ankle and joints of left foot: Secondary | ICD-10-CM | POA: Diagnosis not present

## 2014-06-16 DIAGNOSIS — G894 Chronic pain syndrome: Secondary | ICD-10-CM | POA: Diagnosis not present

## 2014-06-16 DIAGNOSIS — F17200 Nicotine dependence, unspecified, uncomplicated: Secondary | ICD-10-CM

## 2014-06-16 NOTE — Progress Notes (Signed)
Subjective: 49 year old man who continues to have problems with pain in his left ankle. He has been to the orthopedic doctor who cannot do surgery until he gets off the cigarettes and has dental work done. He apparently he is supposed to be getting an appointment, but has never gotten that. He has a group called the [facility] who is supposed to be giving him help getting his various appointments. He also is supposed to be getting into a pain clinic, but that has not gone through yet. He continues to smoke. He has nicotine patches, but is fearful that if he uses the patch and keep smoking he will get too much nicotine. I have told him that he needs to use a patch and quit smoking. We had a discussion regarding that. He cannot do much physical exercise due to the pain in his ankle. He mostly sits around.  Somebody told him that he was diabetic. In looking back in his records it appears that he is not diabetic and has a normal hemoglobin A1c. I think he may be taking some diabetic medication. Advised that next time he, he bring all his medications in the sheet that goes with it so they can be reviewed and clarified.  Objective: Swollen painful left joint. Does not have an effusion, but seems to have bony deformities.  Assessment: Chronic left ankle pain History of alcohol use and GI bleeding, no alcohol for several months Tobacco use disorder Chronic pain syndrome Dental caries  Plan: See instructions as given him  Contact the pain clinic that someone referred you to and find out if the referral is still in progress. If it turns out that a referral is not in the progress of being made, contact our office again and we will put through another referral to a pain clinic. However that would start the process all over again so the best way to be sure you are getting in somewhere is to contact the ones that someone arty referred to.  We cannot continue being regular prescriber's of pain medications from  this practice.  Take Tylenol 2 pills (acetaminophen 500 mg) 3 times daily for ankle pain. Do not exceed 6 pills in 24 hours.  Do not take any Aleve or naproxen or ibuprofen or Advil or Motrin. These can all cause you to bleed internally.  You need to keep working toward getting the surgery on your ankle if that is what the orthopedic doctor recommended you need. Since he recommended that you should quit smoking, you need to work strongly toward that point. You have said that your target was to quit smoking by your birthdate. Your smoking about a pack of cigarettes a day. Use the nicotine patches that you have, but drastically cut back your smoking. Keep tapering back by decreasing the number of cigarettes every 3-4 days until you get back to about 6 cigarettes a day, then quit completely even though it makes her feel miserable. You will get over it if you stick with your goal. Once you have quit smoking do not reach for a single cigarette, because you are addicted to the nicotine in any smoking will drive you back to the cigarettes.  Talk with the [facility] to be certain that you get your dental appointment so you can have the dental care done that is necessary before the orthopedic doctor can operate on your ankle.

## 2014-06-16 NOTE — Patient Instructions (Addendum)
Contact the pain clinic that someone referred you to and find out if the referral is still in progress. If it turns out that a referral is not in the progress of being made, contact our office again and we will put through another referral to a pain clinic. However that would start the process all over again so the best way to be sure you are getting in somewhere is to contact the ones that someone arty referred to.  We cannot continue being regular prescriber's of pain medications from this practice.  Take Tylenol 2 pills (acetaminophen 500 mg) 3 times daily for ankle pain. Do not exceed 6 pills in 24 hours.  Do not take any Aleve or naproxen or ibuprofen or Advil or Motrin. These can all cause you to bleed internally.  You need to keep working toward getting the surgery on your ankle if that is what the orthopedic doctor recommended you need. Since he recommended that you should quit smoking, you need to work strongly toward that point. You have said that your target was to quit smoking by your birthdate. Your smoking about a pack of cigarettes a day. Use the nicotine patches that you have, but drastically cut back your smoking. Keep tapering back by decreasing the number of cigarettes every 3-4 days until you get back to about 6 cigarettes a day, then quit completely even though it makes her feel miserable. You will get over it if you stick with your goal. Once you have quit smoking do not reach for a single cigarette, because you are addicted to the nicotine in any smoking will drive you back to the cigarettes.  Talk with the [facility] to be certain that you get your dental appointment so you can have the dental care done that is necessary before the orthopedic doctor can operate on your ankle.    [facility] Policy for Prescribing Controlled Substances (Revised 01/2012) 1. Prescriptions for controlled substances will be filled by ONE provider at [facility] with whom you have established and developed a plan  for your care, including follow-up. 2. You are encouraged to schedule an appointment with your prescriber at our appointment center for follow-up visits whenever possible. 3. If you request a prescription for the controlled substance while at [facility] for an acute problem (with someone other than your regular prescriber), you MAY be given a ONE-TIME prescription for a 30-day supply of the controlled substance, to allow time for you to return to see your regular prescriber for additional prescriptions.

## 2014-06-22 ENCOUNTER — Ambulatory Visit (INDEPENDENT_AMBULATORY_CARE_PROVIDER_SITE_OTHER): Payer: Medicare Other

## 2014-06-22 ENCOUNTER — Ambulatory Visit (INDEPENDENT_AMBULATORY_CARE_PROVIDER_SITE_OTHER): Payer: Medicare Other | Admitting: Family Medicine

## 2014-06-22 VITALS — BP 110/92 | HR 85 | Temp 97.7°F | Resp 18 | Ht 67.0 in | Wt 209.0 lb

## 2014-06-22 DIAGNOSIS — M25572 Pain in left ankle and joints of left foot: Secondary | ICD-10-CM | POA: Diagnosis not present

## 2014-06-22 DIAGNOSIS — H00013 Hordeolum externum right eye, unspecified eyelid: Secondary | ICD-10-CM | POA: Diagnosis not present

## 2014-06-22 MED ORDER — CEPHALEXIN 500 MG PO CAPS: 500.0000 mg | ORAL_CAPSULE | Freq: Three times a day (TID) | ORAL | Status: DC

## 2014-06-22 NOTE — Addendum Note (Signed)
Addended by: Robyn Haber on: 06/22/2014 01:32 PM   Modules accepted: Orders

## 2014-06-22 NOTE — Progress Notes (Addendum)
Subjective:    Patient ID: [name], male    DOB: [birth year], 49 y.o.   MRN: [id number] This chart was scribed for [name], MD by Jolene Provost, Medical Scribe. This patient was seen in Room  and the patient's care was started a 1:31 PM.  Chief Complaint  Patient presents with   Ankle Pain    Slipped in shower, X 1 week   Stye    Right eye    HPI HPI Comments: [name] is a 49 y.o. male who presents to [facility] complaining of right ankle pain with associated swelling and weakness. Pt states he fell in the shower last night. Pt denies a past hx of gout. Pt states he has a hx of injury to the right ankle.   Review of Systems  Constitutional: Negative for fever and chills.  Musculoskeletal: Positive for joint swelling, arthralgias and gait problem.  Neurological: Positive for weakness.       Objective:   Physical Exam  Constitutional: He is oriented to person, place, and time. He appears well-developed and well-nourished. No distress.  HENT:  Head: Normocephalic and atraumatic.  Eyes: Pupils are equal, round, and reactive to light.  Right eye shows no injection and good range of motion. There is however a lower lid swelling on the medial aspect of the lid with mild tenderness and yellow pus expressed  Neck: Neck supple.  Cardiovascular: Normal rate.   Pulmonary/Chest: Effort normal. No respiratory distress.  Musculoskeletal: Normal range of motion. He exhibits edema and tenderness.  Vagus deformity of the left ankle.  Neurological: He is alert and oriented to person, place, and time. Coordination normal.  Skin: Skin is warm and dry. He is not diaphoretic.  Psychiatric: He has a normal mood and affect. His behavior is normal.  Nursing note and vitals reviewed. [facility] reading (PRIMARY) by  Dr. Milus Glazier:  Left ankle shows chronic arthritic changes with severe sclerosis, valgus deformity, and suggestion of a possible fracture on lateral. I'm getting the  radiologist to do a stat over read..      Assessment & Plan:   This chart was scribed in my presence and reviewed by me personally.    ICD-9-CM ICD-10-CM   1. Left ankle pain 719.47 M25.572 DG Ankle Complete Left     Ambulatory referral to Orthopedic Surgery  2. Hordeolum externum, right 373.11 H00.013 cephALEXin (KEFLEX) 500 MG capsule     Signed, [name], MD

## 2014-06-29 ENCOUNTER — Ambulatory Visit (INDEPENDENT_AMBULATORY_CARE_PROVIDER_SITE_OTHER): Payer: Medicare Other | Admitting: Family Medicine

## 2014-06-29 VITALS — BP 122/78 | HR 88 | Temp 98.7°F | Resp 18 | Ht 67.0 in | Wt 209.6 lb

## 2014-06-29 DIAGNOSIS — H00013 Hordeolum externum right eye, unspecified eyelid: Secondary | ICD-10-CM

## 2014-06-29 DIAGNOSIS — S9302XD Subluxation of left ankle joint, subsequent encounter: Secondary | ICD-10-CM | POA: Diagnosis not present

## 2014-06-29 MED ORDER — OXYCODONE-ACETAMINOPHEN 5-325 MG PO TABS: 1.0000 | ORAL_TABLET | Freq: Three times a day (TID) | ORAL | Status: DC | PRN

## 2014-06-29 NOTE — Patient Instructions (Signed)
We are making an orthopedic appointment for you.  We have written oxycodone pain medicine for you in the meantime.  Continue the hot compresses to the stye in the right eye and finish the antibiotic.

## 2014-06-29 NOTE — Progress Notes (Signed)
This chart was scribed for Dr. [name], MD by Jarvis Morgan, Medical Scribe. This patient was seen in Room 12  and the patient's care was started at 12:01 PM.   Patient ID: [name] MRN: [id number], DOB: March 06, 1966, 49 y.o. Date of Encounter: 06/29/2014, 11:52 AM  Primary Physician: [name], MD  Chief Complaint:  Chief Complaint  Patient presents with   Ankle Pain    left x this morning      HPI: 49 y.o. year old male with history below presents with left ankle pain that has been ongoing for several weeks.  Pt presented to [facility] on 06/22/14 due to an injury, 1 week prior, where he had slipped and fell in the shower. He denies any swelling. He is having associated pain shooting up his left leg and tingling. He notes the pain is keeping him up at night. The pain is exacerbated by ambulation. Pt had been given an ankle brace to wear but states he stopped wearing it due to it causing him pain. He has not been taking anything for the pain. Pt was advised at last visit to follow up with an orthopedic specialist and he states they never called.   Pt also had a stye to his right eye at the last visit. He states it is gradually improving but there is still some mild swelling to the eye. He has been applying hot compresses daily and taking the antibiotic as prescribed.   Past Medical History  Diagnosis Date   Hypertension    Alcoholism     pt is also bi polar   Stroke 05/15/2010    pt states stroke in 2011   Schizophrenic disorder      Home Meds: Prior to Admission medications   Medication Sig Start Date End Date Taking? Authorizing Provider  acamprosate (CAMPRAL) 333 MG tablet Take 2 tablets (666 mg total) by mouth 3 (three) times daily with meals. 03/05/14  Yes Sanjuana Kava, NP  albuterol (PROVENTIL HFA;VENTOLIN HFA) 108 (90 BASE) MCG/ACT inhaler Inhale 1 puff into the lungs 2 (two) times daily as needed for wheezing or shortness of breath. 03/05/14  Yes  Sanjuana Kava, NP  amLODipine-valsartan (EXFORGE) 5-160 MG per tablet Take 1 tablet by mouth daily. For high blood pressure 03/05/14  Yes Sanjuana Kava, NP  benztropine (COGENTIN) 1 MG tablet Take 1 tablet (1 mg total) by mouth daily. For prevention of drug induced movements 03/05/14  Yes Sanjuana Kava, NP  cephALEXin (KEFLEX) 500 MG capsule Take 1 capsule (500 mg total) by mouth 3 (three) times daily. 06/22/14  Yes [name], MD  folic acid (FOLVITE) 1 MG tablet Take 1 tablet (1 mg total) by mouth daily. For low folate 03/05/14  Yes Sanjuana Kava, NP  pantoprazole (PROTONIX) 40 MG tablet Take 1 tablet (40 mg total) by mouth 2 (two) times daily. For acid reflux 03/05/14  Yes Sanjuana Kava, NP  pindolol (VISKEN) 5 MG tablet Take 1 tablet (5 mg total) by mouth 2 (two) times daily. For high blood pressure 03/05/14  Yes Sanjuana Kava, NP  risperiDONE (RISPERDAL) 4 MG tablet Take 1 tablet (4 mg total) by mouth 2 (two) times daily. For mood control 03/05/14  Yes Sanjuana Kava, NP  sucralfate (CARAFATE) 1 G tablet Take 1 tablet (1 g total) by mouth 4 (four) times daily. For acid reflux 03/05/14  Yes Sanjuana Kava, NP  Glucosamine-Chondroitin (GLUCOSAMINE CHONDROITIN COMPLX) 500-250 MG  CAPS Take 1 tablet by mouth 2 (two) times daily. Patient not taking: Reported on 06/29/2014 04/17/14   Sherren Mocha, MD  HYDROcodone-acetaminophen (NORCO/VICODIN) 5-325 MG per tablet Take 1 tablet by mouth at bedtime as needed for moderate pain. Patient not taking: Reported on 06/29/2014 05/17/14   Sherren Mocha, MD  Multiple Vitamin (MULTIVITAMIN WITH MINERALS) TABS tablet Take 1 tablet by mouth daily. For low vitamin Patient not taking: Reported on 06/29/2014 03/05/14   Sanjuana Kava, NP    Allergies:  Allergies  Allergen Reactions   Aspirin Swelling    Throat swelling     History   Social History   Marital Status: Single    Spouse Name: N/A   Number of Children: N/A   Years of Education: N/A   Occupational History    Not on file.   Social History Main Topics   Smoking status: Heavy Tobacco Smoker -- 1.50 packs/day for 13 years    Types: Cigarettes   Smokeless tobacco: Never Used   Alcohol Use: No   Drug Use: No   Sexual Activity: Yes   Other Topics Concern   Not on file   Social History Narrative     Review of Systems: Constitutional: negative for chills, fever, night sweats, weight changes, or fatigue  HEENT: negative for vision changes, hearing loss, congestion, rhinorrhea, ST, epistaxis, or sinus pressure Cardiovascular: negative for chest pain or palpitations Respiratory: negative for hemoptysis, wheezing, shortness of breath, or cough Abdominal: negative for abdominal pain, nausea, vomiting, diarrhea, or constipation Dermatological: negative for rash Neurologic: negative for headache, dizziness, or syncope Musk: positive for arthralgias. negative for swelling All other systems reviewed and are otherwise negative with the exception to those above and in the HPI.   Physical Exam: Blood pressure 122/78, pulse 88, temperature 98.7 F (37.1 C), temperature source Oral, resp. rate 18, height 5\' 7"  (1.702 m), weight 209 lb 9.6 oz (95.074 kg), SpO2 97 %., Body mass index is 32.82 kg/(m^2). General: Well developed, well nourished, in no acute distress. Head: Normocephalic, atraumatic, eyes without discharge, sclera non-icteric, nares are without discharge. Bilateral auditory canals clear, TM's are without perforation, pearly grey and translucent with reflective cone of light bilaterally. Oral cavity moist, posterior pharynx without exudate, erythema, peritonsillar abscess, or post nasal drip. Lower lid hordeolum is smaller than last week but still present.  Neck: Supple. No thyromegaly. Full ROM. No lymphadenopathy.  Lungs: Clear bilaterally to auscultation without wheezes, rales, or rhonchi. Breathing is unlabored. Heart: RRR with S1 S2. No murmurs, rubs, or gallops appreciated. Abdomen:  Soft, non-tender, non-distended with normoactive bowel sounds. No hepatomegaly. No rebound/guarding. No obvious abdominal masses. Msk:  Strength and tone normal for age. Subluxed left ankle with diffuse tenderness to medial lateral malleoli. Good pulses and no edema in feet. Extremities/Skin: Warm and dry. No clubbing or cyanosis. No edema. No rashes or suspicious lesions. Neuro: Alert and oriented X 3. Moves all extremities spontaneously. Gait is normal. CNII-XII grossly in tact. Psych:  Responds to questions appropriately with a normal affect.   Labs:   ASSESSMENT AND PLAN:  49 y.o. year old male with   1. Subluxation of ankle joint, left, subsequent encounter   2. Hordeolum externum, right    Meds ordered this encounter  Medications   oxyCODONE-acetaminophen (ROXICET) 5-325 MG per tablet    Sig: Take 1 tablet by mouth every 8 (eight) hours as needed for severe pain.    Dispense:  20 tablet  Refill:  0    I am trying to get a orthopedic referrals done today. This chart was scribed in my presence and reviewed by me personally.    ICD-9-CM ICD-10-CM   1. Subluxation of ankle joint, left, subsequent encounter V58.89 S93.02XD oxyCODONE-acetaminophen (ROXICET) 5-325 MG per tablet   837.0  Ambulatory referral to Orthopedic Surgery  2. Hordeolum externum, right 373.11 H00.013 Ambulatory referral to Orthopedic Surgery     Signed, [name], MD    Signed, [name], MD 06/29/2014 11:52 AM

## 2014-07-16 ENCOUNTER — Ambulatory Visit (INDEPENDENT_AMBULATORY_CARE_PROVIDER_SITE_OTHER): Payer: Medicare Other | Admitting: Family Medicine

## 2014-07-16 VITALS — BP 118/72 | HR 98 | Temp 98.7°F | Resp 18 | Ht 67.0 in | Wt 209.6 lb

## 2014-07-16 DIAGNOSIS — J320 Chronic maxillary sinusitis: Secondary | ICD-10-CM | POA: Diagnosis not present

## 2014-07-16 DIAGNOSIS — S9302XD Subluxation of left ankle joint, subsequent encounter: Secondary | ICD-10-CM

## 2014-07-16 DIAGNOSIS — Z720 Tobacco use: Secondary | ICD-10-CM

## 2014-07-16 DIAGNOSIS — H6523 Chronic serous otitis media, bilateral: Secondary | ICD-10-CM | POA: Diagnosis not present

## 2014-07-16 MED ORDER — HYDROCODONE-ACETAMINOPHEN 5-325 MG PO TABS
1.0000 | ORAL_TABLET | Freq: Every evening | ORAL | Status: DC | PRN
Start: 2014-07-16 — End: 2015-02-18

## 2014-07-16 MED ORDER — FLUTICASONE PROPIONATE 50 MCG/ACT NA SUSP: 2.0000 | Freq: Every day | NASAL | Status: DC

## 2014-07-16 MED ORDER — AMOXICILLIN 875 MG PO TABS: 875.0000 mg | ORAL_TABLET | Freq: Two times a day (BID) | ORAL | Status: DC

## 2014-07-16 NOTE — Patient Instructions (Signed)
Call [phone] quit now   Smoking Cessation Quitting smoking is important to your health and has many advantages. However, it is not always easy to quit since nicotine is a very addictive drug. Oftentimes, people try 3 times or more before being able to quit. This document explains the best ways for you to prepare to quit smoking. Quitting takes hard work and a lot of effort, but you can do it. ADVANTAGES OF QUITTING SMOKING  You will live longer, feel better, and live better.  Your body will feel the impact of quitting smoking almost immediately.  Within 20 minutes, blood pressure decreases. Your pulse returns to its normal level.  After 8 hours, carbon monoxide levels in the blood return to normal. Your oxygen level increases.  After 24 hours, the chance of having a heart attack starts to decrease. Your breath, hair, and body stop smelling like smoke.  After 48 hours, damaged nerve endings begin to recover. Your sense of taste and smell improve.  After 72 hours, the body is virtually free of nicotine. Your bronchial tubes relax and breathing becomes easier.  After 2 to 12 weeks, lungs can hold more air. Exercise becomes easier and circulation improves.  The risk of having a heart attack, stroke, cancer, or lung disease is greatly reduced.  After 1 year, the risk of coronary heart disease is cut in half.  After 5 years, the risk of stroke falls to the same as a nonsmoker.  After 10 years, the risk of lung cancer is cut in half and the risk of other cancers decreases significantly.  After 15 years, the risk of coronary heart disease drops, usually to the level of a nonsmoker.  If you are pregnant, quitting smoking will improve your chances of having a healthy baby.  The people you live with, especially any children, will be healthier.  You will have extra money to spend on things other than cigarettes. QUESTIONS TO THINK ABOUT BEFORE ATTEMPTING TO QUIT You may want to talk about  your answers with your health care provider.  Why do you want to quit?  If you tried to quit in the past, what helped and what did not?  What will be the most difficult situations for you after you quit? How will you plan to handle them?  Who can help you through the tough times? Your family? Friends? A health care provider?  What pleasures do you get from smoking? What ways can you still get pleasure if you quit? Here are some questions to ask your health care provider:  How can you help me to be successful at quitting?  What medicine do you think would be best for me and how should I take it?  What should I do if I need more help?  What is smoking withdrawal like? How can I get information on withdrawal? GET READY  Set a quit date.  Change your environment by getting rid of all cigarettes, ashtrays, matches, and lighters in your home, car, or work. Do not let people smoke in your home.  Review your past attempts to quit. Think about what worked and what did not. GET SUPPORT AND ENCOURAGEMENT You have a better chance of being successful if you have help. You can get support in many ways.  Tell your family, friends, and coworkers that you are going to quit and need their support. Ask them not to smoke around you.  Get individual, group, or telephone counseling and support. Programs are available  at local hospitals and health centers. Call your local health department for information about programs in your area.  Spiritual beliefs and practices may help some smokers quit.  Download a "quit meter" on your computer to keep track of quit statistics, such as how long you have gone without smoking, cigarettes not smoked, and money saved.  Get a self-help book about quitting smoking and staying off tobacco. [place] yourself from urges to smoke. Talk to someone, go for a walk, or occupy your time with a task.  Change your normal routine. Take a  different route to work. Drink tea instead of coffee. Eat breakfast in a different place.  Reduce your stress. Take a hot bath, exercise, or read a book.  Plan something enjoyable to do every day. Reward yourself for not smoking.  Explore interactive web-based programs that specialize in helping you quit. GET MEDICINE AND USE IT CORRECTLY Medicines can help you stop smoking and decrease the urge to smoke. Combining medicine with the above behavioral methods and support can greatly increase your chances of successfully quitting smoking.  Nicotine replacement therapy helps deliver nicotine to your body without the negative effects and risks of smoking. Nicotine replacement therapy includes nicotine gum, lozenges, inhalers, nasal sprays, and skin patches. Some may be available over-the-counter and others require a prescription.  Antidepressant medicine helps people abstain from smoking, but how this works is unknown. This medicine is available by prescription.  Nicotinic receptor partial agonist medicine simulates the effect of nicotine in your brain. This medicine is available by prescription. Ask your health care provider for advice about which medicines to use and how to use them based on your health history. Your health care provider will tell you what side effects to look out for if you choose to be on a medicine or therapy. Carefully read the information on the package. Do not use any other product containing nicotine while using a nicotine replacement product.  RELAPSE OR DIFFICULT SITUATIONS Most relapses occur within the first 3 months after quitting. Do not be discouraged if you start smoking again. Remember, most people try several times before finally quitting. You may have symptoms of withdrawal because your body is used to nicotine. You may crave cigarettes, be irritable, feel very hungry, cough often, get headaches, or have difficulty concentrating. The withdrawal symptoms are only  temporary. They are strongest when you first quit, but they will go away within 10-14 days. To reduce the chances of relapse, try to:  Avoid drinking alcohol. Drinking lowers your chances of successfully quitting.  Reduce the amount of caffeine you consume. Once you quit smoking, the amount of caffeine in your body increases and can give you symptoms, such as a rapid heartbeat, sweating, and anxiety.  Avoid smokers because they can make you want to smoke.  Do not let weight gain distract you. Many smokers will gain weight when they quit, usually less than 10 pounds. Eat a healthy diet and stay active. You can always lose the weight gained after you quit.  Find ways to improve your mood other than smoking. FOR MORE INFORMATION  www.smokefree.gov  Document Released: 03/10/2001 Document Revised: 07/31/2013 Document Reviewed: 06/25/2011 [facility] Patient Information 2015 [place], [place]. This information is not intended to replace advice given to you by your health care provider. Make sure you discuss any questions you have with your health care provider.

## 2014-07-16 NOTE — Progress Notes (Signed)
Subjective:  This chart was scribed for [name] MD, by Veverly Fells, at [facility].  This patient was seen in room 12 and the patient's care was started at 5:54 PM.    Patient ID: [name], male    DOB: 1965-08-15, 49 y.o.   MRN: [id number] Chief Complaint  Patient presents with  . Medication Refill    oxycodone  . Nasal Congestion    x 2 wks  . Facial Pain    HPI  HPI Comments: [name] is a 49 y.o. male who presents to [facility] for nasal congestion onset two weeks ago.  He has been using nasal spray but states that he stopped using it because he was using it too frequently and started depending on it.  Instead, he has been using a hot wash cloth on his face instead.  He also needs a refill for his oxycodone for his left ankle which he broke several years ago. Patient has a pain management clinic which he attends.  He is having a difficult time quitting smoking and is wondering today what he can do to slowly quit.  Patient is going to the dentist this week as well as an orthopedist for his ankle.   He has no other complaints today.     Review of Systems  Constitutional: Negative for fever and chills.  HENT: Positive for congestion. Negative for ear discharge and hearing loss.   Gastrointestinal: Negative for nausea, vomiting, diarrhea and constipation.  Musculoskeletal: Negative for back pain, neck pain and neck stiffness.  Neurological: Negative for seizures and speech difficulty.       Objective:   Physical Exam Left ankle is swollen with valgus deformity.  Scarring in both TM's.    BP 118/72 mmHg  Pulse 98  Temp(Src) 98.7 F (37.1 C) (Oral)  Resp 18  Ht 5\' 7"  (1.702 m)  Wt 209 lb 9.6 oz (95.074 kg)  BMI 32.82 kg/m2 Both nasal passages are markedly swollen with yellow white exudates Oropharynx: Horrible looking teeth with no leukoplakia Neck: Supple Heart: Regular Chest: Clear      Assessment &  Plan:   This chart was scribed in my presence and reviewed by me personally.    ICD-9-CM ICD-10-CM   1. Bilateral chronic serous otitis media 381.10 H65.23 amoxicillin (AMOXIL) 875 MG tablet     fluticasone (FLONASE) 50 MCG/ACT nasal spray  2. Subluxation of left ankle joint, subsequent encounter V58.89 S93.02XD HYDROcodone-acetaminophen (NORCO/VICODIN) 5-325 MG per tablet   837.0    3. Sinusitis, maxillary, chronic 473.0 J32.0 amoxicillin (AMOXIL) 875 MG tablet     fluticasone (FLONASE) 50 MCG/ACT nasal spray   We discussed the need to quit smoking and patient has already picked a date to quit point ([birth year]  Signed, [name], MD

## 2014-07-23 DIAGNOSIS — R202 Paresthesia of skin: Secondary | ICD-10-CM | POA: Diagnosis not present

## 2014-07-23 DIAGNOSIS — G8929 Other chronic pain: Secondary | ICD-10-CM | POA: Diagnosis not present

## 2014-07-23 DIAGNOSIS — M25572 Pain in left ankle and joints of left foot: Secondary | ICD-10-CM | POA: Diagnosis not present

## 2014-07-23 DIAGNOSIS — G603 Idiopathic progressive neuropathy: Secondary | ICD-10-CM | POA: Diagnosis not present

## 2014-07-23 DIAGNOSIS — G541 Lumbosacral plexus disorders: Secondary | ICD-10-CM | POA: Diagnosis not present

## 2014-07-24 ENCOUNTER — Ambulatory Visit: Payer: Self-pay

## 2014-07-31 ENCOUNTER — Ambulatory Visit: Payer: Self-pay

## 2014-08-07 ENCOUNTER — Ambulatory Visit: Payer: Self-pay

## 2014-10-11 DIAGNOSIS — Z716 Tobacco abuse counseling: Secondary | ICD-10-CM | POA: Diagnosis not present

## 2014-10-11 DIAGNOSIS — E871 Hypo-osmolality and hyponatremia: Secondary | ICD-10-CM | POA: Diagnosis not present

## 2014-10-11 DIAGNOSIS — E119 Type 2 diabetes mellitus without complications: Secondary | ICD-10-CM | POA: Diagnosis not present

## 2014-10-21 ENCOUNTER — Emergency Department (HOSPITAL_COMMUNITY)
Admission: EM | Admit: 2014-10-21 | Discharge: 2014-10-21 | Disposition: A | Discharge status: Home / Self Care | Payer: Medicare Other | Attending: Emergency Medicine | Admitting: Emergency Medicine

## 2014-10-21 ENCOUNTER — Encounter (HOSPITAL_COMMUNITY): Payer: Self-pay

## 2014-10-21 ENCOUNTER — Emergency Department (HOSPITAL_COMMUNITY): Payer: Medicare Other

## 2014-10-21 DIAGNOSIS — Z792 Long term (current) use of antibiotics: Secondary | ICD-10-CM | POA: Insufficient documentation

## 2014-10-21 DIAGNOSIS — I10 Essential (primary) hypertension: Secondary | ICD-10-CM | POA: Insufficient documentation

## 2014-10-21 DIAGNOSIS — R0602 Shortness of breath: Secondary | ICD-10-CM | POA: Diagnosis not present

## 2014-10-21 DIAGNOSIS — R109 Unspecified abdominal pain: Secondary | ICD-10-CM | POA: Diagnosis not present

## 2014-10-21 DIAGNOSIS — R112 Nausea with vomiting, unspecified: Secondary | ICD-10-CM | POA: Diagnosis not present

## 2014-10-21 DIAGNOSIS — Z79899 Other long term (current) drug therapy: Secondary | ICD-10-CM | POA: Insufficient documentation

## 2014-10-21 DIAGNOSIS — F319 Bipolar disorder, unspecified: Secondary | ICD-10-CM | POA: Diagnosis not present

## 2014-10-21 DIAGNOSIS — F209 Schizophrenia, unspecified: Secondary | ICD-10-CM | POA: Insufficient documentation

## 2014-10-21 DIAGNOSIS — Z7951 Long term (current) use of inhaled steroids: Secondary | ICD-10-CM | POA: Diagnosis not present

## 2014-10-21 DIAGNOSIS — Z720 Tobacco use: Secondary | ICD-10-CM | POA: Insufficient documentation

## 2014-10-21 DIAGNOSIS — R079 Chest pain, unspecified: Secondary | ICD-10-CM | POA: Diagnosis not present

## 2014-10-21 DIAGNOSIS — Z8673 Personal history of transient ischemic attack (TIA), and cerebral infarction without residual deficits: Secondary | ICD-10-CM | POA: Diagnosis not present

## 2014-10-21 DIAGNOSIS — R000 Tachycardia, unspecified: Secondary | ICD-10-CM | POA: Diagnosis not present

## 2014-10-21 DIAGNOSIS — R0789 Other chest pain: Secondary | ICD-10-CM | POA: Diagnosis not present

## 2014-10-21 LAB — CBC
HCT: 36.4 % — ABNORMAL LOW (ref 39.0–52.0)
HEMOGLOBIN: 12.9 g/dL — AB (ref 13.0–17.0)
MCH: 32.5 pg (ref 26.0–34.0)
MCHC: 35.4 g/dL (ref 30.0–36.0)
MCV: 91.7 fL (ref 78.0–100.0)
Platelets: 224 10*3/uL (ref 150–400)
RBC: 3.97 MIL/uL — ABNORMAL LOW (ref 4.22–5.81)
RDW: 14.2 % (ref 11.5–15.5)
WBC: 6.3 10*3/uL (ref 4.0–10.5)

## 2014-10-21 LAB — BASIC METABOLIC PANEL
ANION GAP: 11 (ref 5–15)
BUN: <5 mg/dL — ABNORMAL LOW (ref 6–20)
CHLORIDE: 98 mmol/L — AB (ref 101–111)
CO2: 20 mmol/L — AB (ref 22–32)
CREATININE: 0.71 mg/dL (ref 0.61–1.24)
Calcium: 8.1 mg/dL — ABNORMAL LOW (ref 8.9–10.3)
GFR calc Af Amer: >60 mL/min (ref >60)
GFR calc non Af Amer: >60 mL/min (ref >60)
Glucose, Bld: 116 mg/dL — ABNORMAL HIGH (ref 65–99)
POTASSIUM: 3.9 mmol/L (ref 3.5–5.1)
Sodium: 129 mmol/L — ABNORMAL LOW (ref 135–145)

## 2014-10-21 LAB — I-STAT TROPONIN, ED
Troponin i, poc: 0 ng/mL (ref 0.00–0.08)
Troponin i, poc: 0 ng/mL (ref 0.00–0.08)

## 2014-10-21 MED ORDER — SODIUM CHLORIDE 0.9 % IV BOLUS (SEPSIS)
1000.0000 mL | Freq: Once | INTRAVENOUS | Status: AC
  Administered 2014-10-21: 1000 mL via INTRAVENOUS

## 2014-10-21 MED ORDER — LORAZEPAM 2 MG/ML IJ SOLN
1.0000 mg | Freq: Once | INTRAMUSCULAR | Status: AC
  Administered 2014-10-21: 1 mg via INTRAVENOUS
  Filled 2014-10-21: qty 1

## 2014-10-21 NOTE — ED Provider Notes (Signed)
CSN: [id number]     Arrival date & time 10/21/14  0550 History   First MD Initiated Contact with Patient 10/21/14 0602     Chief Complaint  Patient presents with  . Chest Pain     (Consider location/radiation/quality/duration/timing/severity/associated sxs/prior Treatment) HPI Comments: Patient with h/o heavy alcohol use, HTN, unknown family history for CAD -- presents with c/o chest soreness, 'feels like I was punched in the chest', starting around 3am, becoming worse around 4:30am. Associated with nausea, mild SOB. Patient has had similar sx in past with indigestion. Last drink was yesterday. No fever, cough, diarrhea, urinary symptoms. Patient was anxious and called EMS. He was given NTG with improvement of pain to 7/10. No h/o heart attack or stress test. No pain with exertion today or in the recent past. Patient denies risk factors for pulmonary embolism including: unilateral leg swelling, history of DVT/PE/other blood clots, use of hormones, recent immobilizations, recent surgery, recent travel (>4hr segment), malignancy, hemoptysis.   The onset of this condition was acute. The course is improving. Aggravating factors: none. Alleviating factors: none.    Patient is a 49 y.o. male presenting with chest pain. The history is provided by the patient and medical records.  Chest Pain Associated symptoms: shortness of breath   Associated symptoms: no abdominal pain, no back pain, no cough, no diaphoresis, no fever, no nausea, no palpitations and not vomiting     Past Medical History  Diagnosis Date  . Hypertension   . Alcoholism     pt is also bi polar  . Stroke 05/15/2010    pt states stroke in 2011  . Schizophrenic disorder    Past Surgical History  Procedure Laterality Date  . Esophagogastroduodenoscopy Left 12/05/2013    Procedure: ESOPHAGOGASTRODUODENOSCOPY (EGD);  Surgeon: Arta Silence, MD;  Location: [facility];  Service: Endoscopy;  Laterality: Left;   History reviewed. No  pertinent family history. History  Substance Use Topics  . Smoking status: Heavy Tobacco Smoker -- 1.50 packs/day for 13 years    Types: Cigarettes  . Smokeless tobacco: Never Used  . Alcohol Use: 0.0 oz/week    0 Standard drinks or equivalent per week     Comment: pt states he isn't sure but drank at least 7-8 40s tonight    Review of Systems  Constitutional: Negative for fever and diaphoresis.  Eyes: Negative for redness.  Respiratory: Positive for shortness of breath. Negative for cough.   Cardiovascular: Positive for chest pain. Negative for palpitations and leg swelling.  Gastrointestinal: Negative for nausea, vomiting and abdominal pain.  Genitourinary: Negative for dysuria.  Musculoskeletal: Negative for back pain and neck pain.  Skin: Negative for rash.  Neurological: Negative for syncope and light-headedness.  Psychiatric/Behavioral: The patient is not nervous/anxious.     Allergies  Aspirin  Home Medications   Prior to Admission medications   Medication Sig Start Date End Date Taking? Authorizing Provider  acamprosate (CAMPRAL) 333 MG tablet Take 2 tablets (666 mg total) by mouth 3 (three) times daily with meals. 03/05/14  Yes Encarnacion Slates, NP  albuterol (PROVENTIL HFA;VENTOLIN HFA) 108 (90 BASE) MCG/ACT inhaler Inhale 1 puff into the lungs 2 (two) times daily as needed for wheezing or shortness of breath. 03/05/14  Yes Encarnacion Slates, NP  amLODipine-valsartan (EXFORGE) 5-160 MG per tablet Take 1 tablet by mouth daily. For high blood pressure 03/05/14   Encarnacion Slates, NP  amoxicillin (AMOXIL) 875 MG tablet Take 1 tablet (875 mg total) by mouth 2 (  two) times daily. 07/16/14   Robyn Haber, MD  benztropine (COGENTIN) 1 MG tablet Take 1 tablet (1 mg total) by mouth daily. For prevention of drug induced movements 03/05/14   Encarnacion Slates, NP  fluticasone (FLONASE) 50 MCG/ACT nasal spray Place 2 sprays into both nostrils daily. 07/16/14   Robyn Haber, MD  folic acid  (FOLVITE) 1 MG tablet Take 1 tablet (1 mg total) by mouth daily. For low folate 03/05/14   Encarnacion Slates, NP  Glucosamine-Chondroitin (GLUCOSAMINE CHONDROITIN COMPLX) 500-250 MG CAPS Take 1 tablet by mouth 2 (two) times daily. Patient not taking: Reported on 06/29/2014 04/17/14   Shawnee Knapp, MD  HYDROcodone-acetaminophen (NORCO/VICODIN) 5-325 MG per tablet Take 1 tablet by mouth at bedtime as needed for moderate pain. 07/16/14   Robyn Haber, MD  Multiple Vitamin (MULTIVITAMIN WITH MINERALS) TABS tablet Take 1 tablet by mouth daily. For low vitamin 03/05/14   Encarnacion Slates, NP  oxyCODONE-acetaminophen (ROXICET) 5-325 MG per tablet Take 1 tablet by mouth every 8 (eight) hours as needed for severe pain. 06/29/14   Robyn Haber, MD  pantoprazole (PROTONIX) 40 MG tablet Take 1 tablet (40 mg total) by mouth 2 (two) times daily. For acid reflux 03/05/14   Encarnacion Slates, NP  pindolol (VISKEN) 5 MG tablet Take 1 tablet (5 mg total) by mouth 2 (two) times daily. For high blood pressure 03/05/14   Encarnacion Slates, NP  risperiDONE (RISPERDAL) 4 MG tablet Take 1 tablet (4 mg total) by mouth 2 (two) times daily. For mood control 03/05/14   Encarnacion Slates, NP  sucralfate (CARAFATE) 1 G tablet Take 1 tablet (1 g total) by mouth 4 (four) times daily. For acid reflux 03/05/14   Encarnacion Slates, NP   BP 111/68 mmHg  Pulse 112  Temp(Src) 98.3 F (36.8 C) (Oral)  Resp 18  SpO2 96%   Physical Exam  Constitutional: He appears well-developed and well-nourished.  HENT:  Head: Normocephalic and atraumatic.  Mouth/Throat: Mucous membranes are normal. Mucous membranes are not dry.  Eyes: Conjunctivae are normal.  Neck: Trachea normal and normal range of motion. Neck supple. Normal carotid pulses and no JVD present. No muscular tenderness present. Carotid bruit is not present. No tracheal deviation present.  Cardiovascular: Regular rhythm, S1 normal, S2 normal, normal heart sounds and intact distal pulses.  Tachycardia present.   Exam reveals no distant heart sounds and no decreased pulses.   No murmur heard. Pulmonary/Chest: Effort normal and breath sounds normal. No respiratory distress. He has no wheezes. He exhibits no tenderness.  Abdominal: Soft. Normal aorta and bowel sounds are normal. There is no tenderness. There is no rebound and no guarding.  Musculoskeletal: He exhibits no edema.  Neurological: He is alert.  Skin: Skin is warm and dry. He is not diaphoretic. No cyanosis. No pallor.  Psychiatric: He has a normal mood and affect.  Nursing note and vitals reviewed.   ED Course  Procedures (including critical care time) Labs Review Labs Reviewed  CBC - Abnormal; Notable for the following:    RBC 3.97 (*)    Hemoglobin 12.9 (*)    HCT 36.4 (*)    All other components within normal limits  BASIC METABOLIC PANEL - Abnormal; Notable for the following:    Sodium 129 (*)    Chloride 98 (*)    CO2 20 (*)    Glucose, Bld 116 (*)    BUN <5 (*)    Calcium 8.1 (*)  All other components within normal limits  I-STAT TROPOININ, ED  Randolm Idol, ED    Imaging Review Dg Chest 2 View  10/21/2014   CLINICAL DATA:  Midsternal chest pain  EXAM: CHEST  2 VIEW  COMPARISON:  05/15/2010 chest radiograph, chest CT 12/18/2010  FINDINGS: Diffusely prominent interstitial reticular opacities are noted. No focal opacity. Heart size normal. No pleural effusion. No acute osseous finding.  IMPRESSION: Chronically prominent interstitial markings without focal acute finding.   Electronically Signed   By: Conchita Paris M.D.   On: 10/21/2014 07:19     EKG Interpretation   Date/Time:  Sunday October 21 2014 06:00:12 EDT Ventricular Rate:  112 PR Interval:  138 QRS Duration: 83 QT Interval:  329 QTC Calculation: 449 R Axis:   16 Text Interpretation:  Sinus tachycardia Abnormal R-wave progression, early  transition No significant change since last tracing Confirmed by WARD,   DO, KRISTEN ([id number]) on 10/21/2014 6:04:59  AM       Patient seen and examined. Work-up initiated. Medications ordered.   Vital signs reviewed and are as follows: BP 111/68 mmHg  Pulse 112  Temp(Src) 98.3 F (36.8 C) (Oral)  Resp 18  SpO2 96%  9:07 AM Patient is sleeping. States CP is gone. Pending repeat EKG/troponin.   BP 124/86 mmHg  Pulse 106  Temp(Src) 98.3 F (36.8 C) (Oral)  Resp 27  SpO2 96%  Repeat EKG unchanged. Awaiting repeat trop.    10:55 AM Delta troponin is negative. Will ambulate to ensure no significant SOB, but feel he can likely be d/c to home if this is fine. I have very low suspicion for PE given his presentation. CP is not pleuritic and he is not significantly SOB.   11:58 AM Patient ambulated and maintained O2 sat. At this point, feel he can be discharged with PCP f/u.   Patient was counseled to return with severe chest pain, especially if the pain is crushing or pressure-like and spreads to the arms, back, neck, or jaw, or if they have sweating, nausea, or shortness of breath with the pain. They were encouraged to call 911 with these symptoms.   They were also told to return if their chest pain gets worse and does not go away with rest, they have an attack of chest pain lasting longer than usual despite rest and treatment with the medications their caregiver has prescribed, if they wake from sleep with chest pain or shortness of breath, if they feel dizzy or faint, if they have chest pain not typical of their usual pain, or if they have any other emergent concerns regarding their health.  The patient verbalized understanding and agreed.   D/c with substance abuse referrals.   BP 119/91 mmHg  Pulse 103  Temp(Src) 98.3 F (36.8 C) (Oral)  Resp 16  SpO2 96%    MDM   Final diagnoses:  Chest pain, unspecified chest pain type   Pt with CP, HEART = 3. Pain resolved in ED. Cardiac work-up neg with repeat trop and EKG. Ambulated without desat. Mild tachycardia treated with ativan and fluids.  May be element of withdrawal. Also suspect gastritis/GERD 2/2 alcohol abuse. Feel patient is low risk for ACS given history (poor story for ACS/MI), negative troponin(s), normal/unchanged EKG. Do not feel we need d-dimer or CT for PE (no concerning history or red flags, no hypoxia, pain atypical for PE). D/c to home with PCP f/u.      Carlisle Cater, PA-C 10/21/14 1201  Cyril Mourning  N Ward, DO 10/23/14 9935

## 2014-10-21 NOTE — ED Notes (Signed)
Pt reports drinking 7-8 40s tonight; is unsure how much he drinks per week - states "whatever he can get down as fast as he can get them down." Looking for resources to help with alcoholism after leaving ED.

## 2014-10-21 NOTE — ED Notes (Signed)
Patient transported to X-ray 

## 2014-10-21 NOTE — ED Notes (Signed)
Per EMS - pt from home. Woke up this morning around 0445 this morning with substernal chest pain. Denies radiation. States he "felt like he was punched in the chest." Pain worse with deep breathing. Pt has been drinking alcohol heavily for the last month and has woken up with this type of pain before; however, reports feeling more short of breath than normal. 1 episode of vomiting. 20G LFA. Allergic to aspirin. Given 1 nitro with some relief. BP 132/80. Sinus Tachycardia - 110bpm. RR16. CBG 136.

## 2014-10-21 NOTE — ED Notes (Addendum)
Pt ambulated in hallway. O2 went to 95%, with no complaints.

## 2014-10-21 NOTE — Discharge Instructions (Signed)
Please read and follow all provided instructions.  Your diagnoses today include:  1. Chest pain, unspecified chest pain type     Tests performed today include:  An EKG of your heart  A chest x-ray  Cardiac enzymes - a blood test for heart muscle damage  Blood counts and electrolytes  Vital signs. See below for your results today.   Medications prescribed:   None \ Take any prescribed medications only as directed.  Follow-up instructions: Please follow-up with your primary care provider as soon as you can for further evaluation of your symptoms.   Return instructions:  SEEK IMMEDIATE MEDICAL ATTENTION IF:  You have severe chest pain, especially if the pain is crushing or pressure-like and spreads to the arms, back, neck, or jaw, or if you have sweating, nausea (feeling sick to your stomach), or shortness of breath. THIS IS AN EMERGENCY. Don't wait to see if the pain will go away. Get medical help at once. Call 911 or 0 (operator). DO NOT drive yourself to the hospital.   Your chest pain gets worse and does not go away with rest.   You have an attack of chest pain lasting longer than usual, despite rest and treatment with the medications your caregiver has prescribed.   You wake from sleep with chest pain or shortness of breath.  You feel dizzy or faint.  You have chest pain not typical of your usual pain for which you originally saw your caregiver.   You have any other emergent concerns regarding your health.  Additional Information: Chest pain comes from many different causes. Your caregiver has diagnosed you as having chest pain that is not specific for one problem, but does not require admission.  You are at low risk for an acute heart condition or other serious illness.   Your vital signs today were: BP 124/83 mmHg   Pulse 102   Temp(Src) 98.3 F (36.8 C) (Oral)   Resp 17   SpO2 95% If your blood pressure (BP) was elevated above 135/85 this visit, please have this  repeated by your doctor within one month. --------------

## 2014-11-04 ENCOUNTER — Encounter (HOSPITAL_COMMUNITY): Payer: Self-pay | Admitting: *Deleted

## 2014-11-04 ENCOUNTER — Emergency Department (HOSPITAL_COMMUNITY): Payer: Medicare Other

## 2014-11-04 ENCOUNTER — Emergency Department (HOSPITAL_COMMUNITY)
Admission: EM | Admit: 2014-11-04 | Discharge: 2014-11-04 | Disposition: A | Discharge status: Home / Self Care | Payer: Medicare Other | Attending: Emergency Medicine | Admitting: Emergency Medicine

## 2014-11-04 DIAGNOSIS — F209 Schizophrenia, unspecified: Secondary | ICD-10-CM | POA: Diagnosis not present

## 2014-11-04 DIAGNOSIS — W06XXXA Fall from bed, initial encounter: Secondary | ICD-10-CM | POA: Diagnosis not present

## 2014-11-04 DIAGNOSIS — Y998 Other external cause status: Secondary | ICD-10-CM | POA: Diagnosis not present

## 2014-11-04 DIAGNOSIS — R200 Anesthesia of skin: Secondary | ICD-10-CM | POA: Diagnosis not present

## 2014-11-04 DIAGNOSIS — Z7951 Long term (current) use of inhaled steroids: Secondary | ICD-10-CM | POA: Diagnosis not present

## 2014-11-04 DIAGNOSIS — S3992XA Unspecified injury of lower back, initial encounter: Secondary | ICD-10-CM | POA: Diagnosis not present

## 2014-11-04 DIAGNOSIS — F319 Bipolar disorder, unspecified: Secondary | ICD-10-CM | POA: Insufficient documentation

## 2014-11-04 DIAGNOSIS — I10 Essential (primary) hypertension: Secondary | ICD-10-CM | POA: Diagnosis not present

## 2014-11-04 DIAGNOSIS — Z792 Long term (current) use of antibiotics: Secondary | ICD-10-CM | POA: Insufficient documentation

## 2014-11-04 DIAGNOSIS — Y9389 Activity, other specified: Secondary | ICD-10-CM | POA: Diagnosis not present

## 2014-11-04 DIAGNOSIS — Z8673 Personal history of transient ischemic attack (TIA), and cerebral infarction without residual deficits: Secondary | ICD-10-CM | POA: Diagnosis not present

## 2014-11-04 DIAGNOSIS — Z79899 Other long term (current) drug therapy: Secondary | ICD-10-CM | POA: Diagnosis not present

## 2014-11-04 DIAGNOSIS — M5442 Lumbago with sciatica, left side: Secondary | ICD-10-CM

## 2014-11-04 DIAGNOSIS — M5441 Lumbago with sciatica, right side: Secondary | ICD-10-CM

## 2014-11-04 DIAGNOSIS — Y9289 Other specified places as the place of occurrence of the external cause: Secondary | ICD-10-CM | POA: Diagnosis not present

## 2014-11-04 DIAGNOSIS — M5440 Lumbago with sciatica, unspecified side: Secondary | ICD-10-CM | POA: Diagnosis not present

## 2014-11-04 DIAGNOSIS — M549 Dorsalgia, unspecified: Secondary | ICD-10-CM | POA: Diagnosis not present

## 2014-11-04 DIAGNOSIS — Z720 Tobacco use: Secondary | ICD-10-CM | POA: Diagnosis not present

## 2014-11-04 MED ORDER — METHYLPREDNISOLONE ACETATE 80 MG/ML IJ SUSP
80.0000 mg | Freq: Once | INTRAMUSCULAR | Status: AC
  Administered 2014-11-04: 80 mg via INTRAMUSCULAR
  Filled 2014-11-04: qty 1

## 2014-11-04 MED ORDER — HYDROCODONE-ACETAMINOPHEN 5-325 MG PO TABS
1.0000 | ORAL_TABLET | Freq: Once | ORAL | Status: AC
  Administered 2014-11-04: 1 via ORAL
  Filled 2014-11-04: qty 1

## 2014-11-04 MED ORDER — PREDNISONE 50 MG PO TABS: ORAL_TABLET | ORAL | Status: DC

## 2014-11-04 MED ORDER — OXYCODONE-ACETAMINOPHEN 5-325 MG PO TABS: 2.0000 | ORAL_TABLET | ORAL | Status: DC | PRN

## 2014-11-04 NOTE — ED Notes (Signed)
The pt fell off the bed 3-4 nights ago.  Pt c/o pain in his lower back  With numbness

## 2014-11-04 NOTE — Discharge Instructions (Signed)
You have some arthritis in your back. This got flared up when you fell and is now irritating your nerves. Take prednisone daily for 5 days, starting tomorrow. Use the Percocet every 4-6 hours as needed for severe pain. Alternate ice and heat to the area. Stay as mobile as you can. If this is not getting better in the next 1-2 weeks, please follow-up with orthopedics.

## 2014-11-04 NOTE — ED Notes (Signed)
Declined W/C at D/C and was escorted to lobby by RN. 

## 2014-11-04 NOTE — ED Provider Notes (Signed)
CSN: [id number]     Arrival date & time 11/04/14  1535 History   First MD Initiated Contact with Patient 11/04/14 1539     Chief Complaint  Patient presents with  . Fall     (Consider location/radiation/quality/duration/timing/severity/associated sxs/prior Treatment) HPI He is a 49 year old man here for back pain after a fall. He states he was standing on his bed changing Ativan in his room when he slipped and fell landing on his lower back. This happened about 3 days ago. He reports gradual worsening of the pain. He reports midline lumbar pain as well as I lateral low back pain, worse on the right. He will get pains shooting down both of his legs. He also reports numbness in his buttock and legs. He denies any saddle anesthesia, bowel incontinence, and bladder incontinence.  Past Medical History  Diagnosis Date  . Hypertension   . Alcoholism     pt is also bi polar  . Stroke 05/15/2010    pt states stroke in 2011  . Schizophrenic disorder    Past Surgical History  Procedure Laterality Date  . Esophagogastroduodenoscopy Left 12/05/2013    Procedure: ESOPHAGOGASTRODUODENOSCOPY (EGD);  Surgeon: Arta Silence, MD;  Location: [facility];  Service: Endoscopy;  Laterality: Left;   No family history on file. History  Substance Use Topics  . Smoking status: Heavy Tobacco Smoker -- 1.50 packs/day for 13 years    Types: Cigarettes  . Smokeless tobacco: Never Used  . Alcohol Use: 0.0 oz/week    0 Standard drinks or equivalent per week     Comment: pt states he isn't sure but drank at least 7-8 62s tonight    Review of Systems  As in history of present illness  Allergies  Aspirin  Home Medications   Prior to Admission medications   Medication Sig Start Date End Date Taking? Authorizing Provider  acamprosate (CAMPRAL) 333 MG tablet Take 2 tablets (666 mg total) by mouth 3 (three) times daily with meals. 03/05/14   Encarnacion Slates, NP  albuterol (PROVENTIL HFA;VENTOLIN HFA) 108 (90  BASE) MCG/ACT inhaler Inhale 1 puff into the lungs 2 (two) times daily as needed for wheezing or shortness of breath. 03/05/14   Encarnacion Slates, NP  amLODipine-valsartan (EXFORGE) 5-160 MG per tablet Take 1 tablet by mouth daily. For high blood pressure 03/05/14   Encarnacion Slates, NP  amoxicillin (AMOXIL) 875 MG tablet Take 1 tablet (875 mg total) by mouth 2 (two) times daily. 07/16/14   Robyn Haber, MD  benztropine (COGENTIN) 1 MG tablet Take 1 tablet (1 mg total) by mouth daily. For prevention of drug induced movements 03/05/14   Encarnacion Slates, NP  fluticasone (FLONASE) 50 MCG/ACT nasal spray Place 2 sprays into both nostrils daily. 07/16/14   Robyn Haber, MD  folic acid (FOLVITE) 1 MG tablet Take 1 tablet (1 mg total) by mouth daily. For low folate 03/05/14   Encarnacion Slates, NP  Glucosamine-Chondroitin (GLUCOSAMINE CHONDROITIN COMPLX) 500-250 MG CAPS Take 1 tablet by mouth 2 (two) times daily. Patient not taking: Reported on 06/29/2014 04/17/14   Shawnee Knapp, MD  HYDROcodone-acetaminophen (NORCO/VICODIN) 5-325 MG per tablet Take 1 tablet by mouth at bedtime as needed for moderate pain. 07/16/14   Robyn Haber, MD  Multiple Vitamin (MULTIVITAMIN WITH MINERALS) TABS tablet Take 1 tablet by mouth daily. For low vitamin 03/05/14   Encarnacion Slates, NP  oxyCODONE-acetaminophen (PERCOCET/ROXICET) 5-325 MG per tablet Take 2 tablets by mouth every 4 (four)  hours as needed for severe pain. 11/04/14   Melony Overly, MD  oxyCODONE-acetaminophen (ROXICET) 5-325 MG per tablet Take 1 tablet by mouth every 8 (eight) hours as needed for severe pain. 06/29/14   Robyn Haber, MD  pantoprazole (PROTONIX) 40 MG tablet Take 1 tablet (40 mg total) by mouth 2 (two) times daily. For acid reflux 03/05/14   Encarnacion Slates, NP  pindolol (VISKEN) 5 MG tablet Take 1 tablet (5 mg total) by mouth 2 (two) times daily. For high blood pressure 03/05/14   Encarnacion Slates, NP  predniSONE (DELTASONE) 50 MG tablet Take 1 pill daily for 5 days. 11/04/14    Melony Overly, MD  risperiDONE (RISPERDAL) 4 MG tablet Take 1 tablet (4 mg total) by mouth 2 (two) times daily. For mood control 03/05/14   Encarnacion Slates, NP  sucralfate (CARAFATE) 1 G tablet Take 1 tablet (1 g total) by mouth 4 (four) times daily. For acid reflux 03/05/14   Encarnacion Slates, NP   BP 107/76 mmHg  Pulse 88  Temp(Src) 98.6 F (37 C) (Oral)  Resp 16  Ht 5\' 9"  (1.753 m)  Wt 210 lb (95.255 kg)  BMI 31.00 kg/m2  SpO2 97% Physical Exam  Constitutional: He is oriented to person, place, and time. He appears well-developed and well-nourished. No distress.  Cardiovascular: Normal rate.   Pulmonary/Chest: Effort normal.  Musculoskeletal:  Back: No erythema or edema. He does have some tenderness over L3. He is also tender across the lower back. Positive straight leg raise bilaterally. Strength testing is limited by pain.  Neurological: He is alert and oriented to person, place, and time.    ED Course  Procedures (including critical care time) Labs Review Labs Reviewed - No data to display  Imaging Review Dg Lumbar Spine Complete  11/04/2014   CLINICAL DATA:  [place] 4 days ago.  Persistent back pain.  EXAM: LUMBAR SPINE - COMPLETE 4+ VIEW  COMPARISON:  None.  FINDINGS: Normal alignment of the lumbar vertebral bodies. Mild multilevel disc disease. Moderate facet disease at L4-5 and L5-S1. No definite pars defects. No acute fracture. The visualized bony pelvis is intact.  IMPRESSION: Degenerative changes but no acute bony findings.   Electronically Signed   By: Marijo Sanes M.D.   On: 11/04/2014 17:37     EKG Interpretation None      MDM   Final diagnoses:  Bilateral low back pain with sciatica, sciatica laterality unspecified    Norco 5-325 milligrams tablet given for pain. Depo-Medrol 80 mg IM given.  Treat with prednisone and Percocet. If not improving in the next 1-2 weeks, he will follow-up with orthopedics.  Melony Overly, MD 11/04/14 [phone]

## 2014-11-20 DIAGNOSIS — E119 Type 2 diabetes mellitus without complications: Secondary | ICD-10-CM | POA: Diagnosis not present

## 2014-12-07 DIAGNOSIS — M25561 Pain in right knee: Secondary | ICD-10-CM | POA: Diagnosis not present

## 2014-12-07 DIAGNOSIS — M545 Low back pain: Secondary | ICD-10-CM | POA: Diagnosis not present

## 2014-12-07 DIAGNOSIS — M25562 Pain in left knee: Secondary | ICD-10-CM | POA: Diagnosis not present

## 2014-12-07 DIAGNOSIS — M79605 Pain in left leg: Secondary | ICD-10-CM | POA: Diagnosis not present

## 2014-12-07 DIAGNOSIS — M25572 Pain in left ankle and joints of left foot: Secondary | ICD-10-CM | POA: Diagnosis not present

## 2015-01-08 DIAGNOSIS — Z79899 Other long term (current) drug therapy: Secondary | ICD-10-CM | POA: Diagnosis not present

## 2015-02-18 ENCOUNTER — Emergency Department (HOSPITAL_COMMUNITY): Payer: Medicare Other

## 2015-02-18 ENCOUNTER — Inpatient Hospital Stay (HOSPITAL_COMMUNITY)
Admission: EM | Admit: 2015-02-18 | Discharge: 2015-02-21 | DRG: 378 | Disposition: A | Discharge status: Home / Self Care | Payer: Medicare Other | Attending: Internal Medicine | Admitting: Internal Medicine

## 2015-02-18 ENCOUNTER — Encounter (HOSPITAL_COMMUNITY): Payer: Self-pay | Admitting: *Deleted

## 2015-02-18 DIAGNOSIS — D132 Benign neoplasm of duodenum: Secondary | ICD-10-CM | POA: Diagnosis not present

## 2015-02-18 DIAGNOSIS — E872 Acidosis, unspecified: Secondary | ICD-10-CM | POA: Diagnosis present

## 2015-02-18 DIAGNOSIS — K264 Chronic or unspecified duodenal ulcer with hemorrhage: Secondary | ICD-10-CM | POA: Diagnosis not present

## 2015-02-18 DIAGNOSIS — Z23 Encounter for immunization: Secondary | ICD-10-CM | POA: Diagnosis not present

## 2015-02-18 DIAGNOSIS — R911 Solitary pulmonary nodule: Secondary | ICD-10-CM | POA: Diagnosis not present

## 2015-02-18 DIAGNOSIS — Z79891 Long term (current) use of opiate analgesic: Secondary | ICD-10-CM

## 2015-02-18 DIAGNOSIS — K2210 Ulcer of esophagus without bleeding: Secondary | ICD-10-CM | POA: Diagnosis present

## 2015-02-18 DIAGNOSIS — K209 Esophagitis, unspecified: Secondary | ICD-10-CM | POA: Diagnosis not present

## 2015-02-18 DIAGNOSIS — F331 Major depressive disorder, recurrent, moderate: Secondary | ICD-10-CM | POA: Diagnosis not present

## 2015-02-18 DIAGNOSIS — F10929 Alcohol use, unspecified with intoxication, unspecified: Secondary | ICD-10-CM | POA: Diagnosis present

## 2015-02-18 DIAGNOSIS — K3189 Other diseases of stomach and duodenum: Secondary | ICD-10-CM | POA: Diagnosis not present

## 2015-02-18 DIAGNOSIS — Y908 Blood alcohol level of 240 mg/100 ml or more: Secondary | ICD-10-CM | POA: Diagnosis present

## 2015-02-18 DIAGNOSIS — R000 Tachycardia, unspecified: Secondary | ICD-10-CM | POA: Diagnosis present

## 2015-02-18 DIAGNOSIS — F1410 Cocaine abuse, uncomplicated: Secondary | ICD-10-CM | POA: Diagnosis not present

## 2015-02-18 DIAGNOSIS — I10 Essential (primary) hypertension: Secondary | ICD-10-CM | POA: Diagnosis present

## 2015-02-18 DIAGNOSIS — K920 Hematemesis: Secondary | ICD-10-CM | POA: Diagnosis not present

## 2015-02-18 DIAGNOSIS — R443 Hallucinations, unspecified: Secondary | ICD-10-CM | POA: Diagnosis not present

## 2015-02-18 DIAGNOSIS — Z8659 Personal history of other mental and behavioral disorders: Secondary | ICD-10-CM | POA: Diagnosis not present

## 2015-02-18 DIAGNOSIS — K449 Diaphragmatic hernia without obstruction or gangrene: Secondary | ICD-10-CM | POA: Diagnosis not present

## 2015-02-18 DIAGNOSIS — F17210 Nicotine dependence, cigarettes, uncomplicated: Secondary | ICD-10-CM | POA: Diagnosis not present

## 2015-02-18 DIAGNOSIS — Z79899 Other long term (current) drug therapy: Secondary | ICD-10-CM | POA: Diagnosis not present

## 2015-02-18 DIAGNOSIS — K319 Disease of stomach and duodenum, unspecified: Secondary | ICD-10-CM | POA: Diagnosis present

## 2015-02-18 DIAGNOSIS — F10120 Alcohol abuse with intoxication, uncomplicated: Secondary | ICD-10-CM

## 2015-02-18 DIAGNOSIS — Z8711 Personal history of peptic ulcer disease: Secondary | ICD-10-CM

## 2015-02-18 DIAGNOSIS — Z886 Allergy status to analgesic agent status: Secondary | ICD-10-CM

## 2015-02-18 DIAGNOSIS — F209 Schizophrenia, unspecified: Secondary | ICD-10-CM | POA: Diagnosis present

## 2015-02-18 DIAGNOSIS — E119 Type 2 diabetes mellitus without complications: Secondary | ICD-10-CM | POA: Diagnosis present

## 2015-02-18 DIAGNOSIS — K2970 Gastritis, unspecified, without bleeding: Secondary | ICD-10-CM | POA: Diagnosis not present

## 2015-02-18 DIAGNOSIS — K2900 Acute gastritis without bleeding: Secondary | ICD-10-CM | POA: Diagnosis not present

## 2015-02-18 DIAGNOSIS — Z720 Tobacco use: Secondary | ICD-10-CM | POA: Diagnosis present

## 2015-02-18 DIAGNOSIS — Z7984 Long term (current) use of oral hypoglycemic drugs: Secondary | ICD-10-CM | POA: Diagnosis not present

## 2015-02-18 DIAGNOSIS — R110 Nausea: Secondary | ICD-10-CM

## 2015-02-18 DIAGNOSIS — F10229 Alcohol dependence with intoxication, unspecified: Secondary | ICD-10-CM | POA: Diagnosis present

## 2015-02-18 DIAGNOSIS — K922 Gastrointestinal hemorrhage, unspecified: Secondary | ICD-10-CM | POA: Diagnosis present

## 2015-02-18 DIAGNOSIS — D638 Anemia in other chronic diseases classified elsewhere: Secondary | ICD-10-CM | POA: Diagnosis present

## 2015-02-18 DIAGNOSIS — K2960 Other gastritis without bleeding: Secondary | ICD-10-CM | POA: Diagnosis present

## 2015-02-18 LAB — COMPREHENSIVE METABOLIC PANEL
ALK PHOS: 104 U/L (ref 38–126)
ALT: 20 U/L (ref 17–63)
ALT: 20 U/L (ref 17–63)
ANION GAP: 17 — AB (ref 5–15)
ANION GAP: 14 (ref 5–15)
AST: 161 U/L — ABNORMAL HIGH (ref 15–41)
AST: 189 U/L — ABNORMAL HIGH (ref 15–41)
Albumin: 4.2 g/dL (ref 3.5–5.0)
Albumin: 3.9 g/dL (ref 3.5–5.0)
Alkaline Phosphatase: 95 U/L (ref 38–126)
BUN: 11 mg/dL (ref 6–20)
BUN: 9 mg/dL (ref 6–20)
CALCIUM: 8 mg/dL — AB (ref 8.9–10.3)
CHLORIDE: 104 mmol/L (ref 101–111)
CO2: 19 mmol/L — AB (ref 22–32)
CO2: 19 mmol/L — ABNORMAL LOW (ref 22–32)
CREATININE: 0.8 mg/dL (ref 0.61–1.24)
Calcium: 7.9 mg/dL — ABNORMAL LOW (ref 8.9–10.3)
Chloride: 97 mmol/L — ABNORMAL LOW (ref 101–111)
Creatinine, Ser: 0.88 mg/dL (ref 0.61–1.24)
GFR calc non Af Amer: >60 mL/min (ref >60)
Glucose, Bld: 93 mg/dL (ref 65–99)
Glucose, Bld: 83 mg/dL (ref 65–99)
POTASSIUM: 4.1 mmol/L (ref 3.5–5.1)
Potassium: 4.1 mmol/L (ref 3.5–5.1)
SODIUM: 133 mmol/L — AB (ref 135–145)
Sodium: 137 mmol/L (ref 135–145)
Total Bilirubin: 1.1 mg/dL (ref 0.3–1.2)
Total Bilirubin: 0.8 mg/dL (ref 0.3–1.2)
Total Protein: 7 g/dL (ref 6.5–8.1)
Total Protein: 6.6 g/dL (ref 6.5–8.1)

## 2015-02-18 LAB — CBC WITH DIFFERENTIAL/PLATELET
Basophils Absolute: 0 10*3/uL (ref 0.0–0.1)
Basophils Relative: 0 %
EOS ABS: 0 10*3/uL (ref 0.0–0.7)
EOS PCT: 0 %
HCT: 34.7 % — ABNORMAL LOW (ref 39.0–52.0)
Hemoglobin: 12 g/dL — ABNORMAL LOW (ref 13.0–17.0)
LYMPHS ABS: 2.5 10*3/uL (ref 0.7–4.0)
LYMPHS PCT: 25 %
MCH: 31.1 pg (ref 26.0–34.0)
MCHC: 34.6 g/dL (ref 30.0–36.0)
MCV: 89.9 fL (ref 78.0–100.0)
MONO ABS: 0.6 10*3/uL (ref 0.1–1.0)
Monocytes Relative: 6 %
Neutro Abs: 6.6 10*3/uL (ref 1.7–7.7)
Neutrophils Relative %: 69 %
PLATELETS: 209 10*3/uL (ref 150–400)
RBC: 3.86 MIL/uL — ABNORMAL LOW (ref 4.22–5.81)
RDW: 13.3 % (ref 11.5–15.5)
WBC: 9.7 10*3/uL (ref 4.0–10.5)

## 2015-02-18 LAB — URINALYSIS, ROUTINE W REFLEX MICROSCOPIC
BILIRUBIN URINE: NEGATIVE
GLUCOSE, UA: NEGATIVE mg/dL
KETONES UR: 15 mg/dL — AB
Leukocytes, UA: NEGATIVE
Nitrite: NEGATIVE
PROTEIN: NEGATIVE mg/dL
Specific Gravity, Urine: 1.006 (ref 1.005–1.030)
pH: 6 (ref 5.0–8.0)

## 2015-02-18 LAB — RAPID URINE DRUG SCREEN, HOSP PERFORMED
AMPHETAMINES: NOT DETECTED
Barbiturates: NOT DETECTED
Benzodiazepines: NOT DETECTED
Cocaine: NOT DETECTED
Opiates: NOT DETECTED
TETRAHYDROCANNABINOL: NOT DETECTED

## 2015-02-18 LAB — CBC
HCT: 37 % — ABNORMAL LOW (ref 39.0–52.0)
HEMOGLOBIN: 12.5 g/dL — AB (ref 13.0–17.0)
MCH: 31.3 pg (ref 26.0–34.0)
MCHC: 33.8 g/dL (ref 30.0–36.0)
MCV: 92.7 fL (ref 78.0–100.0)
Platelets: 237 10*3/uL (ref 150–400)
RBC: 3.99 MIL/uL — AB (ref 4.22–5.81)
RDW: 13.5 % (ref 11.5–15.5)
WBC: 10.4 10*3/uL (ref 4.0–10.5)

## 2015-02-18 LAB — PHOSPHORUS: Phosphorus: 3.3 mg/dL (ref 2.5–4.6)

## 2015-02-18 LAB — MAGNESIUM: MAGNESIUM: 2 mg/dL (ref 1.7–2.4)

## 2015-02-18 LAB — TYPE AND SCREEN
ABO/RH(D): O POS
Antibody Screen: NEGATIVE

## 2015-02-18 LAB — URINE MICROSCOPIC-ADD ON
BACTERIA UA: NONE SEEN
WBC, UA: NONE SEEN WBC/hpf (ref 0–5)

## 2015-02-18 LAB — SALICYLATE LEVEL: Salicylate Lvl: <4 mg/dL (ref 2.8–30.0)

## 2015-02-18 LAB — PROTIME-INR
INR: 1.03 (ref 0.00–1.49)
PROTHROMBIN TIME: 13.7 s (ref 11.6–15.2)

## 2015-02-18 LAB — ACETAMINOPHEN LEVEL: Acetaminophen (Tylenol), Serum: <10 ug/mL — ABNORMAL LOW (ref 10–30)

## 2015-02-18 LAB — ETHANOL: Alcohol, Ethyl (B): 307 mg/dL (ref <5)

## 2015-02-18 MED ORDER — LORAZEPAM 1 MG PO TABS
1.0000 mg | ORAL_TABLET | Freq: Four times a day (QID) | ORAL | Status: DC | PRN
  Filled 2015-02-18: qty 1

## 2015-02-18 MED ORDER — FOLIC ACID 1 MG PO TABS
1.0000 mg | ORAL_TABLET | Freq: Every day | ORAL | Status: DC
  Administered 2015-02-18 – 2015-02-21 (×4): 1 mg via ORAL
  Filled 2015-02-18 (×4): qty 1

## 2015-02-18 MED ORDER — METFORMIN HCL ER 500 MG PO TB24
500.0000 mg | ORAL_TABLET | Freq: Every day | ORAL | Status: DC
  Administered 2015-02-19 – 2015-02-20 (×2): 500 mg via ORAL
  Filled 2015-02-18 (×3): qty 1

## 2015-02-18 MED ORDER — DULOXETINE HCL 60 MG PO CPEP
60.0000 mg | ORAL_CAPSULE | Freq: Two times a day (BID) | ORAL | Status: DC
  Administered 2015-02-18 – 2015-02-19 (×2): 60 mg via ORAL
  Filled 2015-02-18 (×2): qty 1

## 2015-02-18 MED ORDER — ONDANSETRON HCL 4 MG/2ML IJ SOLN
4.0000 mg | Freq: Four times a day (QID) | INTRAMUSCULAR | Status: DC | PRN
  Administered 2015-02-18: 4 mg via INTRAVENOUS
  Filled 2015-02-18: qty 2

## 2015-02-18 MED ORDER — SODIUM CHLORIDE 0.9 % IV BOLUS (SEPSIS)
1000.0000 mL | Freq: Once | INTRAVENOUS | Status: AC
  Administered 2015-02-18: 1000 mL via INTRAVENOUS

## 2015-02-18 MED ORDER — SODIUM CHLORIDE 0.9 % IJ SOLN
3.0000 mL | Freq: Two times a day (BID) | INTRAMUSCULAR | Status: DC
  Administered 2015-02-19 – 2015-02-21 (×4): 3 mL via INTRAVENOUS

## 2015-02-18 MED ORDER — ONDANSETRON HCL 4 MG/2ML IJ SOLN
4.0000 mg | Freq: Once | INTRAMUSCULAR | Status: AC
  Administered 2015-02-18: 4 mg via INTRAVENOUS
  Filled 2015-02-18: qty 2

## 2015-02-18 MED ORDER — FENTANYL CITRATE (PF) 100 MCG/2ML IJ SOLN
50.0000 ug | Freq: Once | INTRAMUSCULAR | Status: AC
  Administered 2015-02-18: 50 ug via INTRAVENOUS
  Filled 2015-02-18: qty 2

## 2015-02-18 MED ORDER — ACETAMINOPHEN 650 MG RE SUPP: 650.0000 mg | Freq: Four times a day (QID) | RECTAL | Status: DC | PRN

## 2015-02-18 MED ORDER — ONDANSETRON HCL 4 MG PO TABS
4.0000 mg | ORAL_TABLET | Freq: Four times a day (QID) | ORAL | Status: DC | PRN
Start: 2015-02-18 — End: 2015-02-21

## 2015-02-18 MED ORDER — AMLODIPINE BESYLATE 2.5 MG PO TABS
2.5000 mg | ORAL_TABLET | Freq: Every day | ORAL | Status: DC
  Administered 2015-02-18 – 2015-02-21 (×4): 2.5 mg via ORAL
  Filled 2015-02-18 (×4): qty 1

## 2015-02-18 MED ORDER — IRBESARTAN 150 MG PO TABS
150.0000 mg | ORAL_TABLET | Freq: Every day | ORAL | Status: DC
  Administered 2015-02-19 – 2015-02-21 (×3): 150 mg via ORAL
  Filled 2015-02-18 (×3): qty 1

## 2015-02-18 MED ORDER — SODIUM CHLORIDE 0.9 % IV SOLN
INTRAVENOUS | Status: DC
Start: 2015-02-18 — End: 2015-02-21
  Administered 2015-02-18 – 2015-02-21 (×7): via INTRAVENOUS

## 2015-02-18 MED ORDER — BENZTROPINE MESYLATE 1 MG PO TABS
1.0000 mg | ORAL_TABLET | Freq: Every day | ORAL | Status: DC
  Administered 2015-02-18 – 2015-02-21 (×4): 1 mg via ORAL
  Filled 2015-02-18 (×4): qty 1

## 2015-02-18 MED ORDER — PINDOLOL 5 MG PO TABS
5.0000 mg | ORAL_TABLET | Freq: Two times a day (BID) | ORAL | Status: DC
  Administered 2015-02-18 – 2015-02-21 (×6): 5 mg via ORAL
  Filled 2015-02-18 (×6): qty 1

## 2015-02-18 MED ORDER — FLUTICASONE PROPIONATE 50 MCG/ACT NA SUSP
2.0000 | Freq: Every day | NASAL | Status: DC
  Administered 2015-02-20 – 2015-02-21 (×2): 2 via NASAL
  Filled 2015-02-18: qty 16

## 2015-02-18 MED ORDER — ACETAMINOPHEN 325 MG PO TABS: 650.0000 mg | ORAL_TABLET | Freq: Four times a day (QID) | ORAL | Status: DC | PRN

## 2015-02-18 MED ORDER — SODIUM CHLORIDE 0.9 % IV SOLN
8.0000 mg/h | INTRAVENOUS | Status: DC
  Filled 2015-02-18: qty 80

## 2015-02-18 MED ORDER — PANTOPRAZOLE SODIUM 40 MG IV SOLR
40.0000 mg | Freq: Once | INTRAVENOUS | Status: AC
  Administered 2015-02-18: 40 mg via INTRAVENOUS
  Filled 2015-02-18: qty 40

## 2015-02-18 MED ORDER — SODIUM CHLORIDE 0.9 % IV SOLN
80.0000 mg | Freq: Once | INTRAVENOUS | Status: AC
  Administered 2015-02-18: 80 mg via INTRAVENOUS
  Filled 2015-02-18: qty 80

## 2015-02-18 MED ORDER — FOLIC ACID 1 MG PO TABS: 1.0000 mg | ORAL_TABLET | Freq: Every day | ORAL | Status: DC

## 2015-02-18 MED ORDER — ADULT MULTIVITAMIN W/MINERALS CH
1.0000 | ORAL_TABLET | Freq: Every day | ORAL | Status: DC
  Administered 2015-02-19 – 2015-02-21 (×3): 1 via ORAL
  Filled 2015-02-18 (×3): qty 1

## 2015-02-18 MED ORDER — ALBUTEROL SULFATE (2.5 MG/3ML) 0.083% IN NEBU: 2.5000 mg | INHALATION_SOLUTION | Freq: Two times a day (BID) | RESPIRATORY_TRACT | Status: DC | PRN

## 2015-02-18 MED ORDER — PANTOPRAZOLE SODIUM 40 MG IV SOLR: 40.0000 mg | Freq: Two times a day (BID) | INTRAVENOUS | Status: DC

## 2015-02-18 MED ORDER — THIAMINE HCL 100 MG/ML IJ SOLN
100.0000 mg | Freq: Every day | INTRAMUSCULAR | Status: DC
  Administered 2015-02-18: 100 mg via INTRAVENOUS
  Filled 2015-02-18: qty 2
  Filled 2015-02-18 (×2): qty 1

## 2015-02-18 MED ORDER — VITAMIN B-1 100 MG PO TABS
100.0000 mg | ORAL_TABLET | Freq: Every day | ORAL | Status: DC
  Administered 2015-02-19 – 2015-02-21 (×3): 100 mg via ORAL
  Filled 2015-02-18 (×3): qty 1

## 2015-02-18 MED ORDER — ADULT MULTIVITAMIN W/MINERALS CH: 1.0000 | ORAL_TABLET | Freq: Every day | ORAL | Status: DC

## 2015-02-18 MED ORDER — INFLUENZA VAC SPLIT QUAD 0.5 ML IM SUSY
0.5000 mL | PREFILLED_SYRINGE | INTRAMUSCULAR | Status: AC
  Administered 2015-02-19: 0.5 mL via INTRAMUSCULAR
  Filled 2015-02-18 (×3): qty 0.5

## 2015-02-18 MED ORDER — OXYCODONE-ACETAMINOPHEN 5-325 MG PO TABS
2.0000 | ORAL_TABLET | ORAL | Status: DC | PRN
Start: 2015-02-18 — End: 2015-02-21
  Administered 2015-02-18 – 2015-02-21 (×8): 2 via ORAL
  Filled 2015-02-18 (×8): qty 2

## 2015-02-18 MED ORDER — THIAMINE HCL 100 MG/ML IJ SOLN
Freq: Once | INTRAVENOUS | Status: AC
  Administered 2015-02-18: 19:00:00 via INTRAVENOUS
  Filled 2015-02-18: qty 1000

## 2015-02-18 MED ORDER — SUCRALFATE 1 G PO TABS
1.0000 g | ORAL_TABLET | Freq: Four times a day (QID) | ORAL | Status: DC
  Administered 2015-02-18 – 2015-02-21 (×10): 1 g via ORAL
  Filled 2015-02-18 (×12): qty 1

## 2015-02-18 MED ORDER — LORAZEPAM 2 MG/ML IJ SOLN: 1.0000 mg | Freq: Four times a day (QID) | INTRAMUSCULAR | Status: DC | PRN

## 2015-02-18 MED ORDER — RISPERIDONE 2 MG PO TABS
4.0000 mg | ORAL_TABLET | Freq: Two times a day (BID) | ORAL | Status: DC
  Administered 2015-02-18 – 2015-02-19 (×2): 4 mg via ORAL
  Filled 2015-02-18 (×2): qty 2

## 2015-02-18 NOTE — ED Notes (Signed)
1 belongings bag placed in locker # 26.

## 2015-02-18 NOTE — H&P (Addendum)
[facility] History and Physical  [name] MWU:[id number] DOB: 1966/03/14 DOA: 02/18/2015  Referring physician: ER physician: Dr. Tilden Fossa PCP: [name], MD  Chief Complaint: hematemesis   HPI:  49 year old male with past medical history of alcohol abuse and dependence, drug abuse (including cocaine in past), schizophrenia, depression, hypertension, questionable diabetes (takes metformin but not confirmed with PCP based on review of EPIC). Pt reported drinking vodka heavily today. He started to have episodes of vomiting with blood in it. No assoicated abdominal pain. No lightheadedness or loss of consciousness. No tremors. He says he drink most of the time. He is not reporting being depressed or being suicidal. No blood in stool, no melena. No fevers or chills. No chest pain, palpitations or shortness of breath.  In ED, BP was 156/79, HR 122-147, RR 16. Blood work was notable for hemoglobin of 12.5, CO2 of 19. CXR showed no acute cardiopulmonary findings. He was started on protonix drip. ED consulted GI. He has not had any vomiting in ED. Alcohol was 307 on admission. UDS was WNL.  Assessment & Plan    Principal Problem: Hematemesis / Upper GI bleed - Possible esophageal varices - No further bleed since admission - I think we can use protonix 40 mg IV Q 12 hours instead of the drip - Keep NPO - Continue supportive care with IV fluids, antiemetics as needed - Aspirin on hold   Active Problems: Alcohol dependence with intoxication with complication (HCC) / Acute alcohol intoxication (HCC) - Alcohol level was 307 on the admission.  - Started CIWA protocol. Continue multivitamin, folic acid and thiamine  - Monitor on telemetry - Supportive care with IV fluids   Tobacco abuse - Counseled on cessation  History of schizophrenia / Major depressive disorder, recurrent episode, moderate (HCC) / Cocaine abuse - Stable - UDS WNL - Resume home psych  meds  Benign essential HTN - Continue Norvasc   Anemia of chronic disease - Likely due to history of alcohol abuse   Pulmonary nodule - Seen on CT scans in 2010, 2012. Never had follow up since 2012 - May have CT on outpt basis   Metabolic acidosis - From GI losses - Continue to monitor   DVT prophylaxis: - SCD's bilaterally due to risk of bleeding    Radiological Exams on Admission: Dg Chest Port 1 View  02/18/2015  CLINICAL DATA:  Hematemesis EXAM: PORTABLE CHEST - 1 VIEW COMPARISON:  10/21/2014 FINDINGS: The heart size and mediastinal contours are within normal limits. Both lungs are clear. The visualized skeletal structures are unremarkable. IMPRESSION: No active disease. Electronically Signed   By: Alcide Clever M.D.   On: 02/18/2015 15:39     Code Status: Full Family Communication: Plan of care discussed with the patient  Disposition Plan: Admit for further evaluation, telemetry unit   [name], MD  [facility] Pager [phone]  Time spent in minutes: 75 minutes  Review of Systems:  Constitutional: Negative for fever, chills and malaise/fatigue. Negative for diaphoresis.  HENT: Negative for hearing loss, ear pain, nosebleeds, congestion, sore throat, neck pain, tinnitus and ear discharge.   Eyes: Negative for blurred vision, double vision, photophobia, pain, discharge and redness.  Respiratory: Negative for cough, hemoptysis, sputum production, shortness of breath, wheezing and stridor.   Cardiovascular: Negative for chest pain, palpitations, orthopnea, claudication and leg swelling.  Gastrointestinal: per HPI.  Genitourinary: Negative for dysuria, urgency, frequency, hematuria and flank pain.  Musculoskeletal: Negative for myalgias, back pain,  joint pain and falls.  Skin: Negative for itching and rash.  Neurological: Negative for dizziness and weakness. Negative for tingling, tremors, sensory change, speech change, focal weakness, loss of consciousness and  headaches.  Endo/Heme/Allergies: Negative for environmental allergies and polydipsia. Does not bruise/bleed easily.  Psychiatric/Behavioral: Negative for suicidal ideas. The patient is not nervous/anxious.      History reviewed. No pertinent past medical history. Past Surgical History  Procedure Laterality Date  . Esophagogastroduodenoscopy Left 12/05/2013    Procedure: ESOPHAGOGASTRODUODENOSCOPY (EGD);  Surgeon: Willis Modena, MD;  Location: [facility];  Service: Endoscopy;  Laterality: Left;   Social History:  reports that he has been smoking Cigarettes.  He has a 19.5 pack-year smoking history. He has never used smokeless tobacco. He reports that he drinks alcohol. He reports that he does not use illicit drugs.  Allergies  Allergen Reactions  . Aspirin Swelling    Throat swelling     Family History: hypertension in family    Prior to Admission medications   Medication Sig Start Date End Date Taking? Authorizing Provider  acamprosate (CAMPRAL) 333 MG tablet Take 2 tablets (666 mg total) by mouth 3 (three) times daily with meals. 03/05/14  Yes Sanjuana Kava, NP  albuterol (PROVENTIL HFA;VENTOLIN HFA) 108 (90 BASE) MCG/ACT inhaler Inhale 1 puff into the lungs 2 (two) times daily as needed for wheezing or shortness of breath. 03/05/14  Yes Sanjuana Kava, NP  amLODipine (NORVASC) 2.5 MG tablet Take 2.5 mg by mouth daily. 04/10/14 04/10/15 Yes Historical Provider, MD  benztropine (COGENTIN) 1 MG tablet Take 1 tablet (1 mg total) by mouth daily. For prevention of drug induced movements 03/05/14  Yes Sanjuana Kava, NP  DULoxetine (CYMBALTA) 60 MG capsule Take 60 mg by mouth 2 (two) times daily. 11/15/13  Yes Historical Provider, MD  folic acid (FOLVITE) 1 MG tablet Take 1 tablet (1 mg total) by mouth daily. For low folate 03/05/14  Yes Sanjuana Kava, NP  metFORMIN (GLUMETZA) 500 MG (MOD) 24 hr tablet Take 500 mg by mouth every morning. 01/11/15  Yes Historical Provider, MD  Multiple Vitamin  (MULTIVITAMIN WITH MINERALS) TABS tablet Take 1 tablet by mouth daily. For low vitamin 03/05/14  Yes Sanjuana Kava, NP  oxyCODONE-acetaminophen (PERCOCET/ROXICET) 5-325 MG per tablet Take 2 tablets by mouth every 4 (four) hours as needed for severe pain. 11/04/14  Yes Charm Rings, MD  pantoprazole (PROTONIX) 40 MG tablet Take 1 tablet (40 mg total) by mouth 2 (two) times daily. For acid reflux 03/05/14  Yes Sanjuana Kava, NP  pindolol (VISKEN) 5 MG tablet Take 1 tablet (5 mg total) by mouth 2 (two) times daily. For high blood pressure 03/05/14  Yes Sanjuana Kava, NP  risperiDONE (RISPERDAL) 4 MG tablet Take 1 tablet (4 mg total) by mouth 2 (two) times daily. For mood control 03/05/14  Yes Sanjuana Kava, NP  sucralfate (CARAFATE) 1 G tablet Take 1 tablet (1 g total) by mouth 4 (four) times daily. For acid reflux 03/05/14  Yes Sanjuana Kava, NP  traMADol (ULTRAM) 50 MG tablet Take 50 mg by mouth 2 (two) times daily. 12/28/14  Yes Historical Provider, MD  valsartan (DIOVAN) 160 MG tablet Take 160 mg by mouth daily. 07/17/14  Yes Historical Provider, MD   Physical Exam: Filed Vitals:   02/18/15 1912 02/18/15 1915 02/18/15 1915 02/18/15 1937  BP:   131/86 156/79  Pulse: 142  140 141  Temp:    98.5  F (36.9 C)  TempSrc:    Oral  Resp:  16  16  Height:    5\' 9"  (1.753 m)  Weight:    100.3 kg (221 lb 1.9 oz)  SpO2: 96%   100%    Physical Exam  Constitutional: Appears well-developed and well-nourished. No distress.  HENT: Normocephalic. No tonsillar erythema or exudates Eyes: Conjunctivae are normal. No scleral icterus.  Neck: Normal ROM. Neck supple. No JVD. No tracheal deviation. No thyromegaly.  CVS: RRR, S1/S2 +, no murmurs, no gallops, no carotid bruit.  Pulmonary: Effort and breath sounds normal, no stridor, rhonchi, wheezes, rales.  Abdominal: Soft. BS +,  no distension, tenderness, rebound or guarding.  Musculoskeletal: Normal range of motion. No edema and no tenderness.  Lymphadenopathy: No  lymphadenopathy noted, cervical, inguinal. Neuro: Alert. Normal reflexes, muscle tone coordination. No focal neurologic deficits. Skin: Skin is warm and dry. No rash noted.  No erythema. No pallor.  Psychiatric: Normal mood and affect. Behavior, judgment, thought content normal.   Labs on Admission:  Basic Metabolic Panel:  Recent Labs Lab 02/18/15 1522 02/18/15 2020  NA 133* 137  K 4.1 4.1  CL 97* 104  CO2 19* 19*  GLUCOSE 93 83  BUN 11 9  CREATININE 0.88 0.80  CALCIUM 8.0* 7.9*  MG  --  2.0  PHOS  --  3.3   Liver Function Tests:  Recent Labs Lab 02/18/15 1522 02/18/15 2020  AST 161* 189*  ALT 20 20  ALKPHOS 104 95  BILITOT 1.1 0.8  PROT 7.0 6.6  ALBUMIN 4.2 3.9   No results for input(s): LIPASE, AMYLASE in the last 168 hours. No results for input(s): AMMONIA in the last 168 hours. CBC:  Recent Labs Lab 02/18/15 1522 02/18/15 2020  WBC 10.4 9.7  NEUTROABS  --  6.6  HGB 12.5* 12.0*  HCT 37.0* 34.7*  MCV 92.7 89.9  PLT 237 209   Cardiac Enzymes: No results for input(s): CKTOTAL, CKMB, CKMBINDEX, TROPONINI in the last 168 hours. BNP: Invalid input(s): POCBNP CBG: No results for input(s): GLUCAP in the last 168 hours.  If 7PM-7AM, please contact night-coverage [email or web address] Password [email or web address] 02/18/2015, 10:20 PM

## 2015-02-18 NOTE — ED Notes (Signed)
RN WAS ABLE TO DRAW THE CBC AND THE CMP WILL TRY AGAIN LATER FOR THE REST.

## 2015-02-18 NOTE — ED Notes (Signed)
Bed: [id number] Expected date: 02/18/15 Expected time: 2:42 PM Means of arrival:  Comments: EMS male hematemesis

## 2015-02-18 NOTE — ED Provider Notes (Signed)
CSN: [id number]     Arrival date & time 02/18/15  1500 History   First MD Initiated Contact with Patient 02/18/15 1509     No chief complaint on file.     The history is provided by the patient. No language interpreter was used.   [name] is a 49 y.o. male who presents to the Emergency Department complaining of hematemesis.  History is limited due to alcohol intoxication. He states that he drank a gallon of vodka today and then started vomiting a large amount of dark, coffee-ground material as well as a large amount of bright red blood. He reports generalized abdominal pain. He denies SI on our conversation but mentioned to a nurse that he did not want to live. When asked if he tried to harm himself today he does not answer. He states he has a history of hypertension, takes no blood thinners. Symptoms are severe, constant, worsening.  Past Medical History  Diagnosis Date  . Hypertension   . Alcoholism     pt is also bi polar  . Stroke 05/15/2010    pt states stroke in 2011  . Schizophrenic disorder    Past Surgical History  Procedure Laterality Date  . Esophagogastroduodenoscopy Left 12/05/2013    Procedure: ESOPHAGOGASTRODUODENOSCOPY (EGD);  Surgeon: Arta Silence, MD;  Location: [facility];  Service: Endoscopy;  Laterality: Left;   No family history on file. Social History  Substance Use Topics  . Smoking status: Heavy Tobacco Smoker -- 1.50 packs/day for 13 years    Types: Cigarettes  . Smokeless tobacco: Never Used  . Alcohol Use: 0.0 oz/week    0 Standard drinks or equivalent per week     Comment: pt states he isn't sure but drank at least 7-8 40s tonight    Review of Systems  All other systems reviewed and are negative.     Allergies  Aspirin  Home Medications   Prior to Admission medications   Medication Sig Start Date End Date Taking? Authorizing Provider  acamprosate (CAMPRAL) 333 MG tablet Take 2 tablets (666 mg total) by mouth 3 (three) times daily  with meals. 03/05/14   Encarnacion Slates, NP  albuterol (PROVENTIL HFA;VENTOLIN HFA) 108 (90 BASE) MCG/ACT inhaler Inhale 1 puff into the lungs 2 (two) times daily as needed for wheezing or shortness of breath. 03/05/14   Encarnacion Slates, NP  amLODipine-valsartan (EXFORGE) 5-160 MG per tablet Take 1 tablet by mouth daily. For high blood pressure 03/05/14   Encarnacion Slates, NP  amoxicillin (AMOXIL) 875 MG tablet Take 1 tablet (875 mg total) by mouth 2 (two) times daily. 07/16/14   Robyn Haber, MD  benztropine (COGENTIN) 1 MG tablet Take 1 tablet (1 mg total) by mouth daily. For prevention of drug induced movements 03/05/14   Encarnacion Slates, NP  fluticasone (FLONASE) 50 MCG/ACT nasal spray Place 2 sprays into both nostrils daily. 07/16/14   Robyn Haber, MD  folic acid (FOLVITE) 1 MG tablet Take 1 tablet (1 mg total) by mouth daily. For low folate 03/05/14   Encarnacion Slates, NP  Glucosamine-Chondroitin (GLUCOSAMINE CHONDROITIN COMPLX) 500-250 MG CAPS Take 1 tablet by mouth 2 (two) times daily. Patient not taking: Reported on 06/29/2014 04/17/14   Shawnee Knapp, MD  HYDROcodone-acetaminophen (NORCO/VICODIN) 5-325 MG per tablet Take 1 tablet by mouth at bedtime as needed for moderate pain. 07/16/14   Robyn Haber, MD  Multiple Vitamin (MULTIVITAMIN WITH MINERALS) TABS tablet Take 1 tablet by mouth daily.  For low vitamin 03/05/14   Encarnacion Slates, NP  oxyCODONE-acetaminophen (PERCOCET/ROXICET) 5-325 MG per tablet Take 2 tablets by mouth every 4 (four) hours as needed for severe pain. 11/04/14   Melony Overly, MD  oxyCODONE-acetaminophen (ROXICET) 5-325 MG per tablet Take 1 tablet by mouth every 8 (eight) hours as needed for severe pain. 06/29/14   Robyn Haber, MD  pantoprazole (PROTONIX) 40 MG tablet Take 1 tablet (40 mg total) by mouth 2 (two) times daily. For acid reflux 03/05/14   Encarnacion Slates, NP  pindolol (VISKEN) 5 MG tablet Take 1 tablet (5 mg total) by mouth 2 (two) times daily. For high blood pressure 03/05/14    Encarnacion Slates, NP  predniSONE (DELTASONE) 50 MG tablet Take 1 pill daily for 5 days. 11/04/14   Melony Overly, MD  risperiDONE (RISPERDAL) 4 MG tablet Take 1 tablet (4 mg total) by mouth 2 (two) times daily. For mood control 03/05/14   Encarnacion Slates, NP  sucralfate (CARAFATE) 1 G tablet Take 1 tablet (1 g total) by mouth 4 (four) times daily. For acid reflux 03/05/14   Encarnacion Slates, NP   There were no vitals taken for this visit. Physical Exam  Constitutional: He is oriented to person, place, and time. He appears well-developed and well-nourished.  HENT:  Head: Normocephalic and atraumatic.  Cardiovascular: Regular rhythm.   No murmur heard. Tachycardic  Pulmonary/Chest: Effort normal and breath sounds normal. No respiratory distress.  Abdominal: Soft. There is no rebound and no guarding.  Mild to moderate diffuse abdominal tenderness  Musculoskeletal: Normal range of motion. He exhibits no edema or tenderness.  Neurological: He is alert and oriented to person, place, and time.  Skin: Skin is warm and dry.  Psychiatric:  Mildly agitated  Nursing note and vitals reviewed.   ED Course  Procedures (including critical care time) Labs Review Labs Reviewed  COMPREHENSIVE METABOLIC PANEL - Abnormal; Notable for the following:    Sodium 133 (*)    Chloride 97 (*)    CO2 19 (*)    Calcium 8.0 (*)    AST 161 (*)    Anion gap 17 (*)    All other components within normal limits  CBC - Abnormal; Notable for the following:    RBC 3.99 (*)    Hemoglobin 12.5 (*)    HCT 37.0 (*)    All other components within normal limits  ACETAMINOPHEN LEVEL - Abnormal; Notable for the following:    Acetaminophen (Tylenol), Serum <10 (*)    All other components within normal limits  ETHANOL - Abnormal; Notable for the following:    Alcohol, Ethyl (B) 307 (*)    All other components within normal limits  URINALYSIS, ROUTINE W REFLEX MICROSCOPIC (NOT AT [facility]) - Abnormal; Notable for the following:    Hgb  urine dipstick LARGE (*)    Ketones, ur 15 (*)    All other components within normal limits  URINE MICROSCOPIC-ADD ON - Abnormal; Notable for the following:    Squamous Epithelial / LPF 0-5 (*)    All other components within normal limits  CBC WITH DIFFERENTIAL/PLATELET - Abnormal; Notable for the following:    RBC 3.86 (*)    Hemoglobin 12.0 (*)    HCT 34.7 (*)    All other components within normal limits  COMPREHENSIVE METABOLIC PANEL - Abnormal; Notable for the following:    CO2 19 (*)    Calcium 7.9 (*)    AST  189 (*)    All other components within normal limits  PROTIME-INR  SALICYLATE LEVEL  URINE RAPID DRUG SCREEN, HOSP PERFORMED  MAGNESIUM  PHOSPHORUS  HEMOGLOBIN A1C  COMPREHENSIVE METABOLIC PANEL  CBC  TYPE AND SCREEN    Imaging Review Dg Chest Port 1 View  02/18/2015  CLINICAL DATA:  Hematemesis EXAM: PORTABLE CHEST - 1 VIEW COMPARISON:  10/21/2014 FINDINGS: The heart size and mediastinal contours are within normal limits. Both lungs are clear. The visualized skeletal structures are unremarkable. IMPRESSION: No active disease. Electronically Signed   By: Inez Catalina M.D.   On: 02/18/2015 15:39   I have personally reviewed and evaluated these images and lab results as part of my medical decision-making.   EKG Interpretation   Date/Time:  Monday February 18 2015 16:08:05 EST Ventricular Rate:  142 PR Interval:  97 QRS Duration: 87 QT Interval:  299 QTC Calculation: 459 R Axis:   2 Text Interpretation:  Sinus tachycardia Atrial premature complex Abnormal  R-wave progression, early transition Confirmed by Hazle Coca [phone]) on  02/18/2015 4:13:03 PM      MDM   Final diagnoses:  Acute upper GI bleed  Alcohol intoxication, with unspecified complication ([place])    Patient with history of alcohol abuse and esophagitis here for abdominal pain, hematemesis described as coffee ground mixed with bright red blood. He is tachycardic in the emergency department, this  felt to be mixed due to alcohol intoxication and agitation. CBC demonstrates stable anemia. Providing Protonix. Review prior records, he had an endoscopy in September 2015 that demonstrated severe erosive esophagitis, no evidence of varices. Discussed with patient plan for admission for further workup and management. Hospitalist consulted for admission.  D/w Dr. Cristina Gong with [facility], will see the patient in consult.   Quintella Reichert, MD 02/18/15 [phone]

## 2015-02-19 DIAGNOSIS — K264 Chronic or unspecified duodenal ulcer with hemorrhage: Secondary | ICD-10-CM | POA: Diagnosis not present

## 2015-02-19 LAB — COMPREHENSIVE METABOLIC PANEL
ALBUMIN: 3.7 g/dL (ref 3.5–5.0)
ALT: 22 U/L (ref 17–63)
ANION GAP: 13 (ref 5–15)
AST: 205 U/L — AB (ref 15–41)
Alkaline Phosphatase: 89 U/L (ref 38–126)
BUN: 9 mg/dL (ref 6–20)
CO2: 20 mmol/L — AB (ref 22–32)
Calcium: 8.1 mg/dL — ABNORMAL LOW (ref 8.9–10.3)
Chloride: 108 mmol/L (ref 101–111)
Creatinine, Ser: 0.85 mg/dL (ref 0.61–1.24)
GFR calc Af Amer: >60 mL/min (ref >60)
GFR calc non Af Amer: >60 mL/min (ref >60)
GLUCOSE: 69 mg/dL (ref 65–99)
POTASSIUM: 4.2 mmol/L (ref 3.5–5.1)
SODIUM: 141 mmol/L (ref 135–145)
Total Bilirubin: 0.9 mg/dL (ref 0.3–1.2)
Total Protein: 6.4 g/dL — ABNORMAL LOW (ref 6.5–8.1)

## 2015-02-19 LAB — CBC
HEMATOCRIT: 34.7 % — AB (ref 39.0–52.0)
HEMOGLOBIN: 11.9 g/dL — AB (ref 13.0–17.0)
MCH: 31.7 pg (ref 26.0–34.0)
MCHC: 34.3 g/dL (ref 30.0–36.0)
MCV: 92.5 fL (ref 78.0–100.0)
Platelets: 214 10*3/uL (ref 150–400)
RBC: 3.75 MIL/uL — ABNORMAL LOW (ref 4.22–5.81)
RDW: 13.5 % (ref 11.5–15.5)
WBC: 7.8 10*3/uL (ref 4.0–10.5)

## 2015-02-19 LAB — GLUCOSE, CAPILLARY: Glucose-Capillary: 83 mg/dL (ref 65–99)

## 2015-02-19 MED ORDER — RISPERIDONE 2 MG PO TABS
2.0000 mg | ORAL_TABLET | Freq: Two times a day (BID) | ORAL | Status: DC
  Administered 2015-02-19 – 2015-02-21 (×4): 2 mg via ORAL
  Filled 2015-02-19 (×4): qty 1

## 2015-02-19 MED ORDER — CHLORPROMAZINE HCL 100 MG PO TABS
100.0000 mg | ORAL_TABLET | Freq: Every evening | ORAL | Status: DC | PRN
  Filled 2015-02-19: qty 1

## 2015-02-19 MED ORDER — DULOXETINE HCL 60 MG PO CPEP
60.0000 mg | ORAL_CAPSULE | Freq: Every morning | ORAL | Status: DC
  Administered 2015-02-20 – 2015-02-21 (×2): 60 mg via ORAL
  Filled 2015-02-19 (×2): qty 1

## 2015-02-19 NOTE — Consult Note (Signed)
Referring Provider: Dr. Charlies Silvers Primary Care Physician:  Bartholome Bill, MD Primary Gastroenterologist:  Althia Forts  Reason for Consultation:  Hematemesis  HPI: [name] is a 49 y.o. male with a history of polysubstance abuse (cocaine, alcohol) being seen for a consultation due to report of coffee grounds emesis (large amount that looked like "roast beef") after drinking a gallon of vodka. Complaining of associated epigastric pain. Denies melena, hematochezia, bright red blood with the emesis, dizziness, or lightheadness. Takes 4-5 Ibuprofen BID for leg pains. EGD in 2015 following an episode of hematemesis and was found to have esophagitis and gastritis without any varices. Hgb 12.5. Denies chest pain, shortness of breath. Tachycardic and hypertensive on admit. Alcohol level 307. UDS negative.   History reviewed. No pertinent past medical history.  Past Surgical History  Procedure Laterality Date  . Esophagogastroduodenoscopy Left 12/05/2013    Procedure: ESOPHAGOGASTRODUODENOSCOPY (EGD);  Surgeon: Arta Silence, MD;  Location: [facility];  Service: Endoscopy;  Laterality: Left;    Prior to Admission medications   Medication Sig Start Date End Date Taking? Authorizing Provider  acamprosate (CAMPRAL) 333 MG tablet Take 2 tablets (666 mg total) by mouth 3 (three) times daily with meals. 03/05/14  Yes Encarnacion Slates, NP  albuterol (PROVENTIL HFA;VENTOLIN HFA) 108 (90 BASE) MCG/ACT inhaler Inhale 1 puff into the lungs 2 (two) times daily as needed for wheezing or shortness of breath. 03/05/14  Yes Encarnacion Slates, NP  amLODipine (NORVASC) 2.5 MG tablet Take 2.5 mg by mouth daily. 04/10/14 04/10/15 Yes Historical Provider, MD  benztropine (COGENTIN) 1 MG tablet Take 1 tablet (1 mg total) by mouth daily. For prevention of drug induced movements 03/05/14  Yes Encarnacion Slates, NP  DULoxetine (CYMBALTA) 60 MG capsule Take 60 mg by mouth 2 (two) times daily. 11/15/13  Yes Historical Provider, MD   folic acid (FOLVITE) 1 MG tablet Take 1 tablet (1 mg total) by mouth daily. For low folate 03/05/14  Yes Encarnacion Slates, NP  metFORMIN (GLUMETZA) 500 MG (MOD) 24 hr tablet Take 500 mg by mouth every morning. 01/11/15  Yes Historical Provider, MD  Multiple Vitamin (MULTIVITAMIN WITH MINERALS) TABS tablet Take 1 tablet by mouth daily. For low vitamin 03/05/14  Yes Encarnacion Slates, NP  oxyCODONE-acetaminophen (PERCOCET/ROXICET) 5-325 MG per tablet Take 2 tablets by mouth every 4 (four) hours as needed for severe pain. 11/04/14  Yes Melony Overly, MD  pantoprazole (PROTONIX) 40 MG tablet Take 1 tablet (40 mg total) by mouth 2 (two) times daily. For acid reflux 03/05/14  Yes Encarnacion Slates, NP  pindolol (VISKEN) 5 MG tablet Take 1 tablet (5 mg total) by mouth 2 (two) times daily. For high blood pressure 03/05/14  Yes Encarnacion Slates, NP  risperiDONE (RISPERDAL) 4 MG tablet Take 1 tablet (4 mg total) by mouth 2 (two) times daily. For mood control 03/05/14  Yes Encarnacion Slates, NP  sucralfate (CARAFATE) 1 G tablet Take 1 tablet (1 g total) by mouth 4 (four) times daily. For acid reflux 03/05/14  Yes Encarnacion Slates, NP  traMADol (ULTRAM) 50 MG tablet Take 50 mg by mouth 2 (two) times daily. 12/28/14  Yes Historical Provider, MD  valsartan (DIOVAN) 160 MG tablet Take 160 mg by mouth daily. 07/17/14  Yes Historical Provider, MD    Scheduled Meds: . amLODipine  2.5 mg Oral Daily  . benztropine  1 mg Oral Daily  . DULoxetine  60 mg Oral BID  . fluticasone  2 spray Each Nare Daily  . folic acid  1 mg Oral Daily  . Influenza vac split quadrivalent PF  0.5 mL Intramuscular Tomorrow-1000  . irbesartan  150 mg Oral Daily  . metFORMIN  500 mg Oral Q breakfast  . multivitamin with minerals  1 tablet Oral Daily  . [START ON 02/22/2015] pantoprazole (PROTONIX) IV  40 mg Intravenous Q12H  . pindolol  5 mg Oral BID  . risperiDONE  4 mg Oral BID  . sodium chloride  3 mL Intravenous Q12H  . sucralfate  1 g Oral QID  . thiamine  100  mg Oral Daily   Or  . thiamine  100 mg Intravenous Daily   Continuous Infusions: . sodium chloride 125 mL/hr at 02/19/15 0827   PRN Meds:.acetaminophen **OR** acetaminophen, albuterol, LORazepam **OR** LORazepam, ondansetron **OR** ondansetron (ZOFRAN) IV, oxyCODONE-acetaminophen  Allergies as of 02/18/2015 - Review Complete 02/18/2015  Allergen Reaction Noted  . Aspirin Swelling 11/01/2013    History reviewed. No pertinent family history.  Social History   Social History  . Marital Status: Single    Spouse Name: N/A  . Number of Children: N/A  . Years of Education: N/A   Occupational History  . Not on file.   Social History Main Topics  . Smoking status: Heavy Tobacco Smoker -- 1.50 packs/day for 13 years    Types: Cigarettes  . Smokeless tobacco: Never Used  . Alcohol Use: 0.0 oz/week    0 Standard drinks or equivalent per week     Comment: pt states he isn't sure but drank at least 7-8 40s tonight  . Drug Use: No  . Sexual Activity: Yes   Other Topics Concern  . Not on file   Social History Narrative    Review of Systems: All negative except as stated above in HPI.  Physical Exam: Vital signs: Filed Vitals:   02/18/15 1937 02/19/15 0443  BP: 156/79 117/61  Pulse: 141 105  Temp: 98.5 F (36.9 C) 98.4 F (36.9 C)  Resp: 16 18   Last BM Date: 02/18/15 General:   Alert,  Well-developed, well-nourished, pleasant and cooperative in NAD Head: atraumatic Eyes: anicteric sclera ENT: oropharynx clear Neck: supple, nontender Lungs:  Clear throughout to auscultation.   No wheezes, crackles, or rhonchi. No acute distress. Heart:  Regular rate and rhythm; no murmurs, clicks, rubs,  or gallops. Abdomen: minimal epigastric tenderness with guarding, soft, nondistended, +BS  Rectal:  Deferred Ext: no edema Skin: no rash  GI:  Lab Results:  Recent Labs  02/18/15 1522 02/18/15 2020 02/19/15 0438  WBC 10.4 9.7 7.8  HGB 12.5* 12.0* 11.9*  HCT 37.0* 34.7*  34.7*  PLT 237 209 214   BMET  Recent Labs  02/18/15 1522 02/18/15 2020 02/19/15 0438  NA 133* 137 141  K 4.1 4.1 4.2  CL 97* 104 108  CO2 19* 19* 20*  GLUCOSE 93 83 69  BUN 11 9 9   CREATININE 0.88 0.80 0.85  CALCIUM 8.0* 7.9* 8.1*   LFT  Recent Labs  02/19/15 0438  PROT 6.4*  ALBUMIN 3.7  AST 205*  ALT 22  ALKPHOS 89  BILITOT 0.9   PT/INR  Recent Labs  02/18/15 1522  LABPROT 13.7  INR 1.03     Studies/Results: Dg Chest Port 1 View  02/18/2015  CLINICAL DATA:  Hematemesis EXAM: PORTABLE CHEST - 1 VIEW COMPARISON:  10/21/2014 FINDINGS: The heart size and mediastinal contours are within normal limits. Both lungs are clear. The  visualized skeletal structures are unremarkable. IMPRESSION: No active disease. Electronically Signed   By: Inez Catalina M.D.   On: 02/18/2015 15:39    Impression/Plan: 49 yo with alcohol abuse and history of cocaine abuse with an episode of coffee grounds emesis. Suspect erosive esophagitis or alcoholic gastritis. Doubt peptic ulcer bleed or variceal bleed. Continue IV PPI Q 12 hours. Clear liquid diet. NPO p MN. EGD tomorrow. Supportive care.    LOS: 1 day   [place] C.  02/19/2015, 10:25 AM  Pager [phone]  If no answer or after 5 PM call [phone]

## 2015-02-19 NOTE — Anesthesia Preprocedure Evaluation (Addendum)
Anesthesia Evaluation  Patient identified by MRN, date of birth, ID band Patient awake    Reviewed: Allergy & Precautions, NPO status , Patient's Chart, lab work & pertinent test results  History of Anesthesia Complications Negative for: history of anesthetic complications  Airway Mallampati: III  TM Distance: >3 FB Neck ROM: Full    Dental no notable dental hx. (+) Poor Dentition, Missing, Dental Advisory Given   Pulmonary Current Smoker,    Pulmonary exam normal breath sounds clear to auscultation       Cardiovascular hypertension, Normal cardiovascular exam Rhythm:Regular Rate:Normal     Neuro/Psych PSYCHIATRIC DISORDERS Anxiety Depression Schizophrenia negative neurological ROS  negative psych ROS   GI/Hepatic negative GI ROS, (+)     substance abuse  alcohol use and cocaine use,   Endo/Other  negative endocrine ROS  Renal/GU negative Renal ROS  negative genitourinary   Musculoskeletal negative musculoskeletal ROS (+)   Abdominal   Peds negative pediatric ROS (+)  Hematology negative hematology ROS (+)   Anesthesia Other Findings   Reproductive/Obstetrics negative OB ROS                           Anesthesia Physical Anesthesia Plan  ASA: III  Anesthesia Plan: MAC   Post-op Pain Management:    Induction: Intravenous  Airway Management Planned: Nasal Cannula  Additional Equipment:   Intra-op Plan:   Post-operative Plan:   Informed Consent: I have reviewed the patients History and Physical, chart, labs and discussed the procedure including the risks, benefits and alternatives for the proposed anesthesia with the patient or authorized representative who has indicated his/her understanding and acceptance.   Dental advisory given  Plan Discussed with:   Anesthesia Plan Comments:         Anesthesia Quick Evaluation

## 2015-02-19 NOTE — Progress Notes (Signed)
Family would like an ortho consult

## 2015-02-19 NOTE — Consult Note (Signed)
[facility] Consult   Reason for Consult:  Alcohol intoxication, schizophrenia medication management Referring physician: Dr. Charlies Silvers Patient Identification: YASSER HEPP MRN:  [id number] Principal Diagnosis: Alcohol dependence with intoxication with complication [facility]) Diagnosis:   Patient Active Problem List   Diagnosis Date Noted  . Upper GI bleed [K92.2] 02/18/2015  . Benign essential HTN [I10] 02/18/2015  . Anemia of chronic disease [D63.8] 02/18/2015  . Acute alcohol intoxication ([place]) [F10.129] 02/18/2015  . Pulmonary nodule [R91.1] 02/18/2015  . Metabolic acidosis [E95.2] 02/18/2015  . Alcohol dependence with intoxication with complication ([place]) [W41.324]   . Major depressive disorder, recurrent episode, moderate (HCC) [F33.1]   . Cocaine abuse [F14.10] 03/02/2014  . History of schizophrenia [Z86.59] 03/01/2014  . Tobacco abuse [Z72.0] 12/06/2013  . Hematemesis [K92.0] 12/04/2013    Total Time spent with patient: 1 hour  Subjective:   OMER PUCCINELLI is a 49 y.o. male patient admitted with alcohol intoxication and vomiting with blood.  HPI: Stanly Si  is a 49 years old male admitted to [facility] with the alcohol intoxication and vomiting with the blood. Patient reportedly drinking vodka heavily and reportedly his, and hallucinations he has command hallucinations which is telling him to drink more . Patient reportedly receiving psychiatric services from psychotherapeutic services. Patient reportedly drinking alcohol 26 years. Patient lives by himself in an apartment and also receiving Social Security. Patient has no contact with his family members.  Case discussed with the staff RN who provided list of his current medication from psychotherapeutic services. Patient urine drug screen is negative for drugs of abuse but has a history of cocaine in the past. Patient is scheduled for the GI procedure and pending procedure. Patient blood alcohol is  307 on admission. Patient denied history of delirium tremens and withdrawal seizures. Patient has history of shaking, sweating, nausea and vomiting.  He started to have episodes of vomiting with blood in it. No assoicated abdominal pain. No lightheadedness or loss of consciousness. No tremors. He says he drink most of the time. He is not reporting being depressed or being suicidal. No blood in stool, no melena. No fevers or chills. No chest pain, palpitations or shortness of breath.   Past Psychiatric History: Patient has multiple previous acute psychiatric hospitalization and currently receiving outpatient medication management from psychotherapeutic services.  Risk to Self: Is patient at risk for suicide?: No Risk to Others:   Prior Inpatient Therapy:   Prior Outpatient Therapy:    Past Medical History: History reviewed. No pertinent past medical history.  Past Surgical History  Procedure Laterality Date  . Esophagogastroduodenoscopy Left 12/05/2013    Procedure: ESOPHAGOGASTRODUODENOSCOPY (EGD);  Surgeon: Arta Silence, MD;  Location: [facility];  Service: Endoscopy;  Laterality: Left;   Family History: History reviewed. No pertinent family history. Family Psychiatric  History: patient has significant family history of alcohol abuse versus dependence in both his mother and father. Patient has no contact with his son and his mother over 5 years. Social History:  History  Alcohol Use  . 0.0 oz/week  . 0 Standard drinks or equivalent per week    Comment: pt states he isn't sure but drank at least 7-8 40s tonight     History  Drug Use No    Social History   Social History  . Marital Status: Single    Spouse Name: N/A  . Number of Children: N/A  . Years of Education: N/A   Social History Main Topics  .  Smoking status: Heavy Tobacco Smoker -- 1.50 packs/day for 13 years    Types: Cigarettes  . Smokeless tobacco: Never Used  . Alcohol Use: 0.0 oz/week    0 Standard drinks or  equivalent per week     Comment: pt states he isn't sure but drank at least 7-8 40s tonight  . Drug Use: No  . Sexual Activity: Yes   Other Topics Concern  . None   Social History Narrative   Additional Social History: Patient has been disabled and living in independent apartment                           Allergies:   Allergies  Allergen Reactions  . Aspirin Swelling    Throat swelling     Labs:  Results for orders placed or performed during the hospital encounter of 02/18/15 (from the past 48 hour(s))  Comprehensive metabolic panel     Status: Abnormal   Collection Time: 02/18/15  3:22 PM  Result Value Ref Range   Sodium 133 (L) 135 - 145 mmol/L   Potassium 4.1 3.5 - 5.1 mmol/L   Chloride 97 (L) 101 - 111 mmol/L   CO2 19 (L) 22 - 32 mmol/L   Glucose, Bld 93 65 - 99 mg/dL   BUN 11 6 - 20 mg/dL   Creatinine, Ser 0.88 0.61 - 1.24 mg/dL   Calcium 8.0 (L) 8.9 - 10.3 mg/dL   Total Protein 7.0 6.5 - 8.1 g/dL   Albumin 4.2 3.5 - 5.0 g/dL   AST 161 (H) 15 - 41 U/L   ALT 20 17 - 63 U/L   Alkaline Phosphatase 104 38 - 126 U/L   Total Bilirubin 1.1 0.3 - 1.2 mg/dL   GFR calc non Af Amer >60 >60 mL/min   GFR calc Af Amer >60 >60 mL/min    Comment: (NOTE) The eGFR has been calculated using the CKD EPI equation. This calculation has not been validated in all clinical situations. eGFR's persistently <60 mL/min signify possible Chronic Kidney Disease.    Anion gap 17 (H) 5 - 15  CBC     Status: Abnormal   Collection Time: 02/18/15  3:22 PM  Result Value Ref Range   WBC 10.4 4.0 - 10.5 K/uL   RBC 3.99 (L) 4.22 - 5.81 MIL/uL   Hemoglobin 12.5 (L) 13.0 - 17.0 g/dL   HCT 37.0 (L) 39.0 - 52.0 %   MCV 92.7 78.0 - 100.0 fL   MCH 31.3 26.0 - 34.0 pg   MCHC 33.8 30.0 - 36.0 g/dL   RDW 13.5 11.5 - 15.5 %   Platelets 237 150 - 400 K/uL  Protime-INR     Status: None   Collection Time: 02/18/15  3:22 PM  Result Value Ref Range   Prothrombin Time 13.7 11.6 - 15.2 seconds    INR 1.03 0.00 - 1.49  Type and screen [place]     Status: None   Collection Time: 02/18/15  3:55 PM  Result Value Ref Range   ABO/RH(D) O POS    Antibody Screen NEG    Sample Expiration 02/21/2015   Acetaminophen level     Status: Abnormal   Collection Time: 02/18/15  3:55 PM  Result Value Ref Range   Acetaminophen (Tylenol), Serum <10 (L) 10 - 30 ug/mL    Comment:        THERAPEUTIC CONCENTRATIONS VARY SIGNIFICANTLY. A RANGE OF 10-30 ug/mL MAY BE  AN EFFECTIVE CONCENTRATION FOR MANY PATIENTS. HOWEVER, SOME ARE BEST TREATED AT CONCENTRATIONS OUTSIDE THIS RANGE. ACETAMINOPHEN CONCENTRATIONS >150 ug/mL AT 4 HOURS AFTER INGESTION AND >50 ug/mL AT 12 HOURS AFTER INGESTION ARE OFTEN ASSOCIATED WITH TOXIC REACTIONS.   Salicylate level     Status: None   Collection Time: 02/18/15  3:55 PM  Result Value Ref Range   Salicylate Lvl <8.0 2.8 - 30.0 mg/dL  Ethanol     Status: Abnormal   Collection Time: 02/18/15  3:55 PM  Result Value Ref Range   Alcohol, Ethyl (B) 307 (HH) <5 mg/dL    Comment:        LOWEST DETECTABLE LIMIT FOR SERUM ALCOHOL IS 5 mg/dL FOR MEDICAL PURPOSES ONLY CRITICAL RESULT CALLED TO, READ BACK BY AND VERIFIED WITH: DAVIS N RN AT 1643 ON 11.21.16 BY MENDOZA B    Urine rapid drug screen (hosp performed)     Status: None   Collection Time: 02/18/15  4:19 PM  Result Value Ref Range   Opiates NONE DETECTED NONE DETECTED   Cocaine NONE DETECTED NONE DETECTED   Benzodiazepines NONE DETECTED NONE DETECTED   Amphetamines NONE DETECTED NONE DETECTED   Tetrahydrocannabinol NONE DETECTED NONE DETECTED   Barbiturates NONE DETECTED NONE DETECTED    Comment:        DRUG SCREEN FOR MEDICAL PURPOSES ONLY.  IF CONFIRMATION IS NEEDED FOR ANY PURPOSE, NOTIFY LAB WITHIN 5 DAYS.        LOWEST DETECTABLE LIMITS FOR URINE DRUG SCREEN Drug Class       Cutoff (ng/mL) Amphetamine      1000 Barbiturate      200 Benzodiazepine   165 Tricyclics        537 Opiates          300 Cocaine          300 THC              50   Urinalysis, Routine w reflex microscopic     Status: Abnormal   Collection Time: 02/18/15  4:19 PM  Result Value Ref Range   Color, Urine YELLOW YELLOW   APPearance CLEAR CLEAR   Specific Gravity, Urine 1.006 1.005 - 1.030   pH 6.0 5.0 - 8.0   Glucose, UA NEGATIVE NEGATIVE mg/dL   Hgb urine dipstick LARGE (A) NEGATIVE   Bilirubin Urine NEGATIVE NEGATIVE   Ketones, ur 15 (A) NEGATIVE mg/dL   Protein, ur NEGATIVE NEGATIVE mg/dL   Nitrite NEGATIVE NEGATIVE   Leukocytes, UA NEGATIVE NEGATIVE  Urine microscopic-add on     Status: Abnormal   Collection Time: 02/18/15  4:19 PM  Result Value Ref Range   Squamous Epithelial / LPF 0-5 (A) NONE SEEN    Comment: Please note change in reference range.   WBC, UA NONE SEEN 0 - 5 WBC/hpf    Comment: Please note change in reference range.   RBC / HPF 0-5 0 - 5 RBC/hpf    Comment: Please note change in reference range.   Bacteria, UA NONE SEEN NONE SEEN    Comment: Please note change in reference range.  CBC WITH DIFFERENTIAL     Status: Abnormal   Collection Time: 02/18/15  8:20 PM  Result Value Ref Range   WBC 9.7 4.0 - 10.5 K/uL   RBC 3.86 (L) 4.22 - 5.81 MIL/uL   Hemoglobin 12.0 (L) 13.0 - 17.0 g/dL   HCT 34.7 (L) 39.0 - 52.0 %   MCV 89.9 78.0 - 100.0 fL  MCH 31.1 26.0 - 34.0 pg   MCHC 34.6 30.0 - 36.0 g/dL   RDW 13.3 11.5 - 15.5 %   Platelets 209 150 - 400 K/uL   Neutrophils Relative % 69 %   Neutro Abs 6.6 1.7 - 7.7 K/uL   Lymphocytes Relative 25 %   Lymphs Abs 2.5 0.7 - 4.0 K/uL   Monocytes Relative 6 %   Monocytes Absolute 0.6 0.1 - 1.0 K/uL   Eosinophils Relative 0 %   Eosinophils Absolute 0.0 0.0 - 0.7 K/uL   Basophils Relative 0 %   Basophils Absolute 0.0 0.0 - 0.1 K/uL  Comprehensive metabolic panel     Status: Abnormal   Collection Time: 02/18/15  8:20 PM  Result Value Ref Range   Sodium 137 135 - 145 mmol/L   Potassium 4.1 3.5 - 5.1 mmol/L    Chloride 104 101 - 111 mmol/L   CO2 19 (L) 22 - 32 mmol/L   Glucose, Bld 83 65 - 99 mg/dL   BUN 9 6 - 20 mg/dL   Creatinine, Ser 0.80 0.61 - 1.24 mg/dL   Calcium 7.9 (L) 8.9 - 10.3 mg/dL   Total Protein 6.6 6.5 - 8.1 g/dL   Albumin 3.9 3.5 - 5.0 g/dL   AST 189 (H) 15 - 41 U/L   ALT 20 17 - 63 U/L   Alkaline Phosphatase 95 38 - 126 U/L   Total Bilirubin 0.8 0.3 - 1.2 mg/dL   GFR calc non Af Amer >60 >60 mL/min   GFR calc Af Amer >60 >60 mL/min    Comment: (NOTE) The eGFR has been calculated using the CKD EPI equation. This calculation has not been validated in all clinical situations. eGFR's persistently <60 mL/min signify possible Chronic Kidney Disease.    Anion gap 14 5 - 15  Magnesium     Status: None   Collection Time: 02/18/15  8:20 PM  Result Value Ref Range   Magnesium 2.0 1.7 - 2.4 mg/dL  Phosphorus     Status: None   Collection Time: 02/18/15  8:20 PM  Result Value Ref Range   Phosphorus 3.3 2.5 - 4.6 mg/dL  Comprehensive metabolic panel     Status: Abnormal   Collection Time: 02/19/15  4:38 AM  Result Value Ref Range   Sodium 141 135 - 145 mmol/L   Potassium 4.2 3.5 - 5.1 mmol/L   Chloride 108 101 - 111 mmol/L   CO2 20 (L) 22 - 32 mmol/L   Glucose, Bld 69 65 - 99 mg/dL   BUN 9 6 - 20 mg/dL   Creatinine, Ser 0.85 0.61 - 1.24 mg/dL   Calcium 8.1 (L) 8.9 - 10.3 mg/dL   Total Protein 6.4 (L) 6.5 - 8.1 g/dL   Albumin 3.7 3.5 - 5.0 g/dL   AST 205 (H) 15 - 41 U/L   ALT 22 17 - 63 U/L   Alkaline Phosphatase 89 38 - 126 U/L   Total Bilirubin 0.9 0.3 - 1.2 mg/dL   GFR calc non Af Amer >60 >60 mL/min   GFR calc Af Amer >60 >60 mL/min    Comment: (NOTE) The eGFR has been calculated using the CKD EPI equation. This calculation has not been validated in all clinical situations. eGFR's persistently <60 mL/min signify possible Chronic Kidney Disease.    Anion gap 13 5 - 15  CBC     Status: Abnormal   Collection Time: 02/19/15  4:38 AM  Result Value Ref Range   WBC  7.8 4.0 - 10.5 K/uL   RBC 3.75 (L) 4.22 - 5.81 MIL/uL   Hemoglobin 11.9 (L) 13.0 - 17.0 g/dL   HCT 34.7 (L) 39.0 - 52.0 %   MCV 92.5 78.0 - 100.0 fL   MCH 31.7 26.0 - 34.0 pg   MCHC 34.3 30.0 - 36.0 g/dL   RDW 13.5 11.5 - 15.5 %   Platelets 214 150 - 400 K/uL  Glucose, capillary     Status: None   Collection Time: 02/19/15  7:28 AM  Result Value Ref Range   Glucose-Capillary 83 65 - 99 mg/dL    Current Facility-Administered Medications  Medication Dose Route Frequency Provider Last Rate Last Dose  . 0.9 %  sodium chloride infusion   Intravenous Continuous Eugenie Filler, MD 125 mL/hr at 02/19/15 0827    . acetaminophen (TYLENOL) tablet 650 mg  650 mg Oral Q6H PRN Robbie Lis, MD       Or  . acetaminophen (TYLENOL) suppository 650 mg  650 mg Rectal Q6H PRN Robbie Lis, MD      . albuterol (PROVENTIL) (2.5 MG/3ML) 0.083% nebulizer solution 2.5 mg  2.5 mg Inhalation BID PRN Robbie Lis, MD      . amLODipine (NORVASC) tablet 2.5 mg  2.5 mg Oral Daily Robbie Lis, MD   2.5 mg at 02/19/15 1029  . benztropine (COGENTIN) tablet 1 mg  1 mg Oral Daily Robbie Lis, MD   1 mg at 02/19/15 1028  . DULoxetine (CYMBALTA) DR capsule 60 mg  60 mg Oral BID Robbie Lis, MD   60 mg at 02/19/15 1029  . fluticasone (FLONASE) 50 MCG/ACT nasal spray 2 spray  2 spray Each Nare Daily Robbie Lis, MD   2 spray at 16/96/78 9381  . folic acid (FOLVITE) tablet 1 mg  1 mg Oral Daily Robbie Lis, MD   1 mg at 02/19/15 1028  . irbesartan (AVAPRO) tablet 150 mg  150 mg Oral Daily Robbie Lis, MD   150 mg at 02/19/15 1028  . LORazepam (ATIVAN) tablet 1 mg  1 mg Oral Q6H PRN Robbie Lis, MD       Or  . LORazepam (ATIVAN) injection 1 mg  1 mg Intravenous Q6H PRN Robbie Lis, MD      . metFORMIN (GLUCOPHAGE-XR) 24 hr tablet 500 mg  500 mg Oral Q breakfast Robbie Lis, MD   500 mg at 02/19/15 0827  . multivitamin with minerals tablet 1 tablet  1 tablet Oral Daily Robbie Lis, MD   1 tablet at  02/19/15 1029  . ondansetron (ZOFRAN) tablet 4 mg  4 mg Oral Q6H PRN Robbie Lis, MD       Or  . ondansetron [facility]) injection 4 mg  4 mg Intravenous Q6H PRN Robbie Lis, MD   4 mg at 02/18/15 2056  . oxyCODONE-acetaminophen (PERCOCET/ROXICET) 5-325 MG per tablet 2 tablet  2 tablet Oral Q4H PRN Robbie Lis, MD   2 tablet at 02/19/15 0827  . [START ON 02/22/2015] pantoprazole (PROTONIX) injection 40 mg  40 mg Intravenous Q12H Eugenie Filler, MD      . pindolol (VISKEN) tablet 5 mg  5 mg Oral BID Robbie Lis, MD   5 mg at 02/19/15 1028  . risperiDONE (RISPERDAL) tablet 4 mg  4 mg Oral BID Robbie Lis, MD   4 mg at 02/19/15 1028  . sodium chloride 0.9 %  injection 3 mL  3 mL Intravenous Q12H Robbie Lis, MD   0 mL at 02/18/15 2138  . sucralfate (CARAFATE) tablet 1 g  1 g Oral QID Robbie Lis, MD   1 g at 02/19/15 1029  . thiamine (VITAMIN B-1) tablet 100 mg  100 mg Oral Daily Robbie Lis, MD   100 mg at 02/19/15 1028   Or  . thiamine (B-1) injection 100 mg  100 mg Intravenous Daily Robbie Lis, MD   100 mg at 02/18/15 1903    Musculoskeletal: Strength & Muscle Tone: decreased Gait & Station: unable to stand Patient leans: N/A  Psychiatric Specialty Exam: ROS abdominal pain, nausea and vomiting but denied shortness of breath and chest pain No Fever-chills, No Headache, No changes with Vision or hearing, reports vertigo No problems swallowing food or Liquids, No Chest pain, Cough or Shortness of Breath, No Abdominal pain, No Nausea or Vommitting, Bowel movements are regular, No Blood in stool or Urine, No dysuria, No new skin rashes or bruises, No new joints pains-aches,  No new weakness, tingling, numbness in any extremity, No recent weight gain or loss, No polyuria, polydypsia or polyphagia,   A full 10 point Review of Systems was done, except as stated above, all other Review of Systems were negative.  Blood pressure 117/61, pulse 105, temperature 98.4 F  (36.9 C), temperature source Oral, resp. rate 18, height $RemoveBe'5\' 9"'[id number]$  (1.753 m), weight 102 kg (224 lb 13.9 oz), SpO2 97 %.Body mass index is 33.19 kg/(m^2).  General Appearance: Guarded  Eye Contact::  Fair  Speech:  Clear and Coherent  Volume:  Decreased  Mood:  Depressed  Affect:  Constricted and Depressed  Thought Process:  Coherent and Goal Directed  Orientation:  Full (Time, Place, and Person)  Thought Content:  Hallucinations: Command:  Reportedly telling him to drink more alcohol. and Rumination  Suicidal Thoughts:  No  Homicidal Thoughts:  No  Memory:  Immediate;   Fair Recent;   Fair  Judgement:  Impaired  Insight:  Fair  Psychomotor Activity:  Decreased  Concentration:  Fair  Recall:  [employer] of Knowledge:Good  Language: Good  Akathisia:  Negative  Handed:  Right  AIMS (if indicated):     Assets:  Communication Skills Desire for Improvement Housing Leisure Time Resilience  ADL's:  Intact  Cognition: WNL  Sleep:      Treatment Plan Summary: Daily contact with patient to assess and evaluate symptoms and progress in treatment and Medication management  Patient denies suicidal homicidal ideation and contracts for safety Ativan detox protocol for alcohol withdrawal symptoms and supportive therapy Risperidone 2 mg twice daily for schizophrenic psychosis Continue benztropine 1 mg twice daily for EPS Chlorpromazine 100 mg at bedtime as needed for agitation  Cymbalta 60 mg daily morning for depression  Disposition: Refer to the psychiatric social service to contact ACT team members from the psychotherapeutic services for outpatient mental health treatment  Patient does not meet criteria for psychiatric inpatient admission. Supportive therapy provided about ongoing stressors. Refer to IOP. and supportive groups like Alcoholics Anonymous or Narcotics Anonymous   Marli Diego,JANARDHAHA R. 02/19/2015 10:55 AM

## 2015-02-19 NOTE — Care Management Note (Signed)
Case Management Note  Patient Details  Name: [name] MRN: [id number] Date of Birth: [birth year]  Subjective/Objective:   49 y/o m admitted w/hematemesis. From home.                 Action/Plan:d/c plan home.   Expected Discharge Date:   (unknown)               Expected Discharge Plan:  Home/Self Care  In-House Referral:     Discharge planning Services  CM Consult  Post Acute Care Choice:    Choice offered to:     DME Arranged:    DME Agency:     HH Arranged:    HH Agency:     Status of Service:  In process, will continue to follow  Medicare Important Message Given:    Date Medicare IM Given:    Medicare IM give by:    Date Additional Medicare IM Given:    Additional Medicare Important Message give by:     If discussed at [place] of Stay Meetings, dates discussed:    Additional Comments:  Dessa Phi, RN 02/19/2015, 11:14 AM

## 2015-02-19 NOTE — Progress Notes (Signed)
Patient ID: TADD HNAT, male   DOB: Nov 21, 1965, 49 y.o.   MRN: [id number] [facility] PROGRESS NOTE  ANTHONYMICHAEL MCGARY GLO:[id number] DOB: Mar 21, 1966 DOA: 09-Mar-2015 PCP: [name], MD  Brief narrative:    49 year old male with past medical history of alcohol abuse and dependence, drug abuse (including cocaine in past), schizophrenia, depression, hypertension, questionable diabetes (takes metformin but not confirmed with PCP based on review of EPIC). Pt reported drinking vodka heavily on almost daily basis. Patient started having vomiting with blood on the day prior to the admission. No blood in the stool.  In ED, BP was 156/79, HR 122-147, RR 16. Blood work was notable for hemoglobin of 12.5, CO2 of 19. CXR showed no acute cardiopulmonary findings. He was started on protonix drip. ED consulted GI. He has not had any vomiting in ED. Alcohol was 307 on admission. UDS was WNL.  Anticipated discharge: Plan for endoscopy tomorrow per GI. If patient remains stable he could possibly be discharged 02/20/2015 after endoscopy.  Assessment/Plan:    Principal Problem: Hematemesis / Upper GI bleed - Possible esophageal varices considering patient's history of alcohol abuse. Possibility also includes peptic ulcer disease. - We will continue to hold aspirin because of risk of bleeding - No further hematemesis noted since the admission - Continue Protonix 40 mg IV every 12 hours - Patient seen by gastroenterology in consultation - Plan for endoscopy tomorrow - Nothing by mouth after midnight for endoscopy   Active Problems: Alcohol dependence with intoxication with complication (HCC) / Acute alcohol intoxication (HCC) - Alcohol level 307 on the admission.  - Started CIWA protocol.  - Continue multivitamin, folic acid and thiamine  - No reports of withdrawals  Tobacco abuse - Patient counseled on cessation  History of schizophrenia / Major depressive disorder, recurrent  episode, moderate (HCC) / Cocaine abuse - Has a sitter at bedside. Psychiatry consulted for recommendation on medications. Patient is on multiple medications including Cogentin, Cymbalta, risperidone - UDS WNL  Benign essential HTN - Continue Norvasc and avapro - Continue pindolol  Diabetes mellitus without complications - Patient on metformin but not clear if he really has a history all for diabetes. Per review of Epic primary care physician has asked the patient in past to bring the medications during the visit but it hasn't been clear whether he really has diabetes - Check A1c  Anemia of chronic disease - Likely due to history of alcohol abuse  - Hemoglobin stable  Pulmonary nodule - Seen on CT scans in 2010, 2012. Never had follow up since 2012 - May have CT on outpt basis   Metabolic acidosis - Likely secondary to GI losses - Improving with fluids   DVT Prophylaxis  - SCD's bilaterally due to risk of bleeding    Code Status: Full.  Family Communication:  plan of care discussed with the patient Disposition Plan: EGD tomorrow. If stable may be discharged tomorrow 11/23 or 11/24  IV access:  Peripheral IV  Procedures and diagnostic studies:    Dg Chest Port 1 View 2015-03-09  No active disease. Electronically Signed   By: Alcide Clever M.D.   On: 2015-03-09 15:39   Medical Consultants:  [facility], Dr. Charlott Rakes   Other Consultants:  None   IAnti-Infectives:   None    [name], MD  [facility] Pager [phone]  Time spent in minutes: 25 minutes  If 7PM-7AM, please contact night-coverage [email or web address] Password [facility] 02/19/2015, 10:56 AM   LOS:  1 day    HPI/Subjective: No acute overnight events. Patient reports he feels better this am. No vomiting.   Objective: Filed Vitals:   02/18/15 1915 02/18/15 1937 02/19/15 0443 02/19/15 0500  BP: 131/86 156/79 117/61   Pulse: 140 141 105   Temp:  98.5 F (36.9 C) 98.4 F (36.9  C)   TempSrc:  Oral Oral   Resp:  16 18   Height:  5\' 9"  (1.753 m)    Weight:  100.3 kg (221 lb 1.9 oz)  102 kg (224 lb 13.9 oz)  SpO2:  100% 97%     Intake/Output Summary (Last 24 hours) at 02/19/15 1056 Last data filed at 02/19/15 0756  Gross per 24 hour  Intake 1489.58 ml  Output   1300 ml  Net 189.58 ml    Exam:   General:  Pt is alert, not in acute distress  Cardiovascular: Regular rate and rhythm, S1/S2, no murmurs  Respiratory: Clear to auscultation bilaterally, no wheezing, no crackles, no rhonchi  Abdomen: Soft, non tender, non distended, bowel sounds present  Extremities: No edema, pulses DP and PT palpable bilaterally  Neuro: Grossly nonfocal  Data Reviewed: Basic Metabolic Panel:  Recent Labs Lab 02/18/15 1522 02/18/15 2020 02/19/15 0438  NA 133* 137 141  K 4.1 4.1 4.2  CL 97* 104 108  CO2 19* 19* 20*  GLUCOSE 93 83 69  BUN 11 9 9   CREATININE 0.88 0.80 0.85  CALCIUM 8.0* 7.9* 8.1*  MG  --  2.0  --   PHOS  --  3.3  --    Liver Function Tests:  Recent Labs Lab 02/18/15 1522 02/18/15 2020 02/19/15 0438  AST 161* 189* 205*  ALT 20 20 22   ALKPHOS 104 95 89  BILITOT 1.1 0.8 0.9  PROT 7.0 6.6 6.4*  ALBUMIN 4.2 3.9 3.7   No results for input(s): LIPASE, AMYLASE in the last 168 hours. No results for input(s): AMMONIA in the last 168 hours. CBC:  Recent Labs Lab 02/18/15 1522 02/18/15 2020 02/19/15 0438  WBC 10.4 9.7 7.8  NEUTROABS  --  6.6  --   HGB 12.5* 12.0* 11.9*  HCT 37.0* 34.7* 34.7*  MCV 92.7 89.9 92.5  PLT 237 209 214   Cardiac Enzymes: No results for input(s): CKTOTAL, CKMB, CKMBINDEX, TROPONINI in the last 168 hours. BNP: Invalid input(s): POCBNP CBG:  Recent Labs Lab 02/19/15 0728  GLUCAP 83    No results found for this or any previous visit (from the past 240 hour(s)).   Scheduled Meds: . amLODipine  2.5 mg Oral Daily  . benztropine  1 mg Oral Daily  . DULoxetine  60 mg Oral BID  . fluticasone  2 spray  Each Nare Daily  . folic acid  1 mg Oral Daily  . irbesartan  150 mg Oral Daily  . metFORMIN  500 mg Oral Q breakfast  . multivitamin with minerals  1 tablet Oral Daily  . [START ON 02/22/2015] pantoprazole (PROTONIX) IV  40 mg Intravenous Q12H  . pindolol  5 mg Oral BID  . risperiDONE  4 mg Oral BID  . sodium chloride  3 mL Intravenous Q12H  . sucralfate  1 g Oral QID  . thiamine  100 mg Oral Daily   Or  . thiamine  100 mg Intravenous Daily   Continuous Infusions: . sodium chloride 125 mL/hr at 02/19/15 0827

## 2015-02-20 ENCOUNTER — Encounter (HOSPITAL_COMMUNITY): Payer: Self-pay

## 2015-02-20 ENCOUNTER — Inpatient Hospital Stay (HOSPITAL_COMMUNITY): Payer: Medicare Other | Admitting: Anesthesiology

## 2015-02-20 ENCOUNTER — Encounter (HOSPITAL_COMMUNITY)
Admission: EM | Disposition: A | Discharge status: Home / Self Care | Payer: Self-pay | Source: Home / Self Care | Attending: Internal Medicine

## 2015-02-20 HISTORY — PX: ESOPHAGOGASTRODUODENOSCOPY (EGD) WITH PROPOFOL: SHX5813

## 2015-02-20 LAB — HEMOGLOBIN A1C
HEMOGLOBIN A1C: 5.9 % — AB (ref 4.8–5.6)
Mean Plasma Glucose: 123 mg/dL

## 2015-02-20 LAB — CBC
HCT: 32.7 % — ABNORMAL LOW (ref 39.0–52.0)
HEMOGLOBIN: 11.2 g/dL — AB (ref 13.0–17.0)
MCH: 32 pg (ref 26.0–34.0)
MCHC: 34.3 g/dL (ref 30.0–36.0)
MCV: 93.4 fL (ref 78.0–100.0)
Platelets: 179 10*3/uL (ref 150–400)
RBC: 3.5 MIL/uL — ABNORMAL LOW (ref 4.22–5.81)
RDW: 13.4 % (ref 11.5–15.5)
WBC: 5.6 10*3/uL (ref 4.0–10.5)

## 2015-02-20 LAB — GLUCOSE, CAPILLARY: GLUCOSE-CAPILLARY: 88 mg/dL (ref 65–99)

## 2015-02-20 SURGERY — ESOPHAGOGASTRODUODENOSCOPY (EGD) WITH PROPOFOL
Anesthesia: Monitor Anesthesia Care

## 2015-02-20 MED ORDER — PROPOFOL 10 MG/ML IV BOLUS
INTRAVENOUS | Status: AC
  Filled 2015-02-20: qty 40

## 2015-02-20 MED ORDER — LIDOCAINE HCL (CARDIAC) 20 MG/ML IV SOLN
INTRAVENOUS | Status: DC | PRN
  Administered 2015-02-20: 100 mg via INTRAVENOUS

## 2015-02-20 MED ORDER — LACTATED RINGERS IV SOLN
INTRAVENOUS | Status: DC
  Administered 2015-02-20: 1000 mL via INTRAVENOUS

## 2015-02-20 MED ORDER — BUTAMBEN-TETRACAINE-BENZOCAINE 2-2-14 % EX AERO
INHALATION_SPRAY | CUTANEOUS | Status: DC | PRN
  Administered 2015-02-20: 2 via TOPICAL

## 2015-02-20 MED ORDER — PROPOFOL 10 MG/ML IV BOLUS
INTRAVENOUS | Status: AC
  Filled 2015-02-20: qty 20

## 2015-02-20 MED ORDER — MIDAZOLAM HCL 5 MG/5ML IJ SOLN
INTRAMUSCULAR | Status: DC | PRN
  Administered 2015-02-20: 2 mg via INTRAVENOUS

## 2015-02-20 MED ORDER — PROPOFOL 500 MG/50ML IV EMUL
INTRAVENOUS | Status: DC | PRN
  Administered 2015-02-20: 75 ug/kg/min via INTRAVENOUS

## 2015-02-20 MED ORDER — PROPOFOL 10 MG/ML IV BOLUS
INTRAVENOUS | Status: DC | PRN
  Administered 2015-02-20: 40 mg via INTRAVENOUS

## 2015-02-20 MED ORDER — MIDAZOLAM HCL 2 MG/2ML IJ SOLN
INTRAMUSCULAR | Status: AC
  Filled 2015-02-20: qty 2

## 2015-02-20 MED ORDER — LIDOCAINE HCL (CARDIAC) 20 MG/ML IV SOLN
INTRAVENOUS | Status: AC
  Filled 2015-02-20: qty 5

## 2015-02-20 SURGICAL SUPPLY — 14 items

## 2015-02-20 NOTE — Brief Op Note (Signed)
Friable duodenal bulb lesion - s/p biopsies. Mild antral gastritis. Minimal distal erosive esophagitis. Advance diet. F/U on path. Change to PO PPI BID when tolerating solid food. If tolerates diet, then ok to go home tomorrow and f/u as an outpt. Will sign off. Call back if questions.

## 2015-02-20 NOTE — Anesthesia Postprocedure Evaluation (Signed)
Anesthesia Post Note  Patient: [name]  Procedure(s) Performed: Procedure(s) (LRB): ESOPHAGOGASTRODUODENOSCOPY (EGD) WITH PROPOFOL (N/A)  Patient location during evaluation: PACU Anesthesia Type: MAC Level of consciousness: awake and alert Pain management: pain level controlled Vital Signs Assessment: post-procedure vital signs reviewed and stable Respiratory status: spontaneous breathing, nonlabored ventilation, respiratory function stable and patient connected to nasal cannula oxygen Cardiovascular status: blood pressure returned to baseline and stable Postop Assessment: No signs of nausea or vomiting Anesthetic complications: no    Last Vitals:  Filed Vitals:   02/20/15 0952 02/20/15 1135  BP: 117/85 110/63  Pulse: 99 89  Temp: 36.5 C 36.6 C  Resp: 20 18    Last Pain:  Filed Vitals:   02/20/15 1136  PainSc: 2                  Teighan Aubert JENNETTE

## 2015-02-20 NOTE — Transfer of Care (Signed)
Immediate Anesthesia Transfer of Care Note  Patient: [name]  Procedure(s) Performed: Procedure(s): ESOPHAGOGASTRODUODENOSCOPY (EGD) WITH PROPOFOL (N/A)  Patient Location: PACU and Endoscopy Unit  Anesthesia Type:MAC  Level of Consciousness: awake, alert , oriented and patient cooperative  Airway & Oxygen Therapy: Patient Spontanous Breathing and Patient connected to nasal cannula oxygen  Post-op Assessment: Report given to RN, Post -op Vital signs reviewed and stable and Patient moving all extremities  Post vital signs: Reviewed and stable  Last Vitals:  Filed Vitals:   02/20/15 0459 02/20/15 0745  BP: 129/85 129/81  Pulse: 88 90  Temp: 36.8 C 36.8 C  Resp: 19 15    Complications: No apparent anesthesia complications

## 2015-02-20 NOTE — Anesthesia Procedure Notes (Signed)
Procedure Name: MAC Date/Time: 02/20/2015 8:40 AM Performed by: Carleene Cooper A Pre-anesthesia Checklist: Patient identified, Timeout performed, Emergency Drugs available, Suction available and Patient being monitored Patient Re-evaluated:Patient Re-evaluated prior to inductionOxygen Delivery Method: Nasal cannula Dental Injury: Teeth and Oropharynx as per pre-operative assessment

## 2015-02-20 NOTE — Interval H&P Note (Signed)
History and Physical Interval Note:  02/20/2015 8:37 AM  [name]  has presented today for surgery, with the diagnosis of Bleeding  The various methods of treatment have been discussed with the patient and family. After consideration of risks, benefits and other options for treatment, the patient has consented to  Procedure(s): ESOPHAGOGASTRODUODENOSCOPY (EGD) WITH PROPOFOL (N/A) as a surgical intervention .  The patient's history has been reviewed, patient examined, no change in status, stable for surgery.  I have reviewed the patient's chart and labs.  Questions were answered to the patient's satisfaction.     [place] C.

## 2015-02-20 NOTE — H&P (View-Only) (Signed)
Referring Provider: Dr. Charlies Silvers Primary Care Physician:  Bartholome Bill, MD Primary Gastroenterologist:  Althia Forts  Reason for Consultation:  Hematemesis  HPI: [name] is a 49 y.o. male with a history of polysubstance abuse (cocaine, alcohol) being seen for a consultation due to report of coffee grounds emesis (large amount that looked like "roast beef") after drinking a gallon of vodka. Complaining of associated epigastric pain. Denies melena, hematochezia, bright red blood with the emesis, dizziness, or lightheadness. Takes 4-5 Ibuprofen BID for leg pains. EGD in 2015 following an episode of hematemesis and was found to have esophagitis and gastritis without any varices. Hgb 12.5. Denies chest pain, shortness of breath. Tachycardic and hypertensive on admit. Alcohol level 307. UDS negative.   History reviewed. No pertinent past medical history.  Past Surgical History  Procedure Laterality Date  . Esophagogastroduodenoscopy Left 12/05/2013    Procedure: ESOPHAGOGASTRODUODENOSCOPY (EGD);  Surgeon: Arta Silence, MD;  Location: [facility];  Service: Endoscopy;  Laterality: Left;    Prior to Admission medications   Medication Sig Start Date End Date Taking? Authorizing Provider  acamprosate (CAMPRAL) 333 MG tablet Take 2 tablets (666 mg total) by mouth 3 (three) times daily with meals. 03/05/14  Yes Encarnacion Slates, NP  albuterol (PROVENTIL HFA;VENTOLIN HFA) 108 (90 BASE) MCG/ACT inhaler Inhale 1 puff into the lungs 2 (two) times daily as needed for wheezing or shortness of breath. 03/05/14  Yes Encarnacion Slates, NP  amLODipine (NORVASC) 2.5 MG tablet Take 2.5 mg by mouth daily. 04/10/14 04/10/15 Yes Historical Provider, MD  benztropine (COGENTIN) 1 MG tablet Take 1 tablet (1 mg total) by mouth daily. For prevention of drug induced movements 03/05/14  Yes Encarnacion Slates, NP  DULoxetine (CYMBALTA) 60 MG capsule Take 60 mg by mouth 2 (two) times daily. 11/15/13  Yes Historical Provider, MD   folic acid (FOLVITE) 1 MG tablet Take 1 tablet (1 mg total) by mouth daily. For low folate 03/05/14  Yes Encarnacion Slates, NP  metFORMIN (GLUMETZA) 500 MG (MOD) 24 hr tablet Take 500 mg by mouth every morning. 01/11/15  Yes Historical Provider, MD  Multiple Vitamin (MULTIVITAMIN WITH MINERALS) TABS tablet Take 1 tablet by mouth daily. For low vitamin 03/05/14  Yes Encarnacion Slates, NP  oxyCODONE-acetaminophen (PERCOCET/ROXICET) 5-325 MG per tablet Take 2 tablets by mouth every 4 (four) hours as needed for severe pain. 11/04/14  Yes Melony Overly, MD  pantoprazole (PROTONIX) 40 MG tablet Take 1 tablet (40 mg total) by mouth 2 (two) times daily. For acid reflux 03/05/14  Yes Encarnacion Slates, NP  pindolol (VISKEN) 5 MG tablet Take 1 tablet (5 mg total) by mouth 2 (two) times daily. For high blood pressure 03/05/14  Yes Encarnacion Slates, NP  risperiDONE (RISPERDAL) 4 MG tablet Take 1 tablet (4 mg total) by mouth 2 (two) times daily. For mood control 03/05/14  Yes Encarnacion Slates, NP  sucralfate (CARAFATE) 1 G tablet Take 1 tablet (1 g total) by mouth 4 (four) times daily. For acid reflux 03/05/14  Yes Encarnacion Slates, NP  traMADol (ULTRAM) 50 MG tablet Take 50 mg by mouth 2 (two) times daily. 12/28/14  Yes Historical Provider, MD  valsartan (DIOVAN) 160 MG tablet Take 160 mg by mouth daily. 07/17/14  Yes Historical Provider, MD    Scheduled Meds: . amLODipine  2.5 mg Oral Daily  . benztropine  1 mg Oral Daily  . DULoxetine  60 mg Oral BID  . fluticasone  2 spray Each Nare Daily  . folic acid  1 mg Oral Daily  . Influenza vac split quadrivalent PF  0.5 mL Intramuscular Tomorrow-1000  . irbesartan  150 mg Oral Daily  . metFORMIN  500 mg Oral Q breakfast  . multivitamin with minerals  1 tablet Oral Daily  . [START ON 02/22/2015] pantoprazole (PROTONIX) IV  40 mg Intravenous Q12H  . pindolol  5 mg Oral BID  . risperiDONE  4 mg Oral BID  . sodium chloride  3 mL Intravenous Q12H  . sucralfate  1 g Oral QID  . thiamine  100  mg Oral Daily   Or  . thiamine  100 mg Intravenous Daily   Continuous Infusions: . sodium chloride 125 mL/hr at 02/19/15 0827   PRN Meds:.acetaminophen **OR** acetaminophen, albuterol, LORazepam **OR** LORazepam, ondansetron **OR** ondansetron (ZOFRAN) IV, oxyCODONE-acetaminophen  Allergies as of 02/18/2015 - Review Complete 02/18/2015  Allergen Reaction Noted  . Aspirin Swelling 11/01/2013    History reviewed. No pertinent family history.  Social History   Social History  . Marital Status: Single    Spouse Name: N/A  . Number of Children: N/A  . Years of Education: N/A   Occupational History  . Not on file.   Social History Main Topics  . Smoking status: Heavy Tobacco Smoker -- 1.50 packs/day for 13 years    Types: Cigarettes  . Smokeless tobacco: Never Used  . Alcohol Use: 0.0 oz/week    0 Standard drinks or equivalent per week     Comment: pt states he isn't sure but drank at least 7-8 40s tonight  . Drug Use: No  . Sexual Activity: Yes   Other Topics Concern  . Not on file   Social History Narrative    Review of Systems: All negative except as stated above in HPI.  Physical Exam: Vital signs: Filed Vitals:   02/18/15 1937 02/19/15 0443  BP: 156/79 117/61  Pulse: 141 105  Temp: 98.5 F (36.9 C) 98.4 F (36.9 C)  Resp: 16 18   Last BM Date: 02/18/15 General:   Alert,  Well-developed, well-nourished, pleasant and cooperative in NAD Head: atraumatic Eyes: anicteric sclera ENT: oropharynx clear Neck: supple, nontender Lungs:  Clear throughout to auscultation.   No wheezes, crackles, or rhonchi. No acute distress. Heart:  Regular rate and rhythm; no murmurs, clicks, rubs,  or gallops. Abdomen: minimal epigastric tenderness with guarding, soft, nondistended, +BS  Rectal:  Deferred Ext: no edema Skin: no rash  GI:  Lab Results:  Recent Labs  02/18/15 1522 02/18/15 2020 02/19/15 0438  WBC 10.4 9.7 7.8  HGB 12.5* 12.0* 11.9*  HCT 37.0* 34.7*  34.7*  PLT 237 209 214   BMET  Recent Labs  02/18/15 1522 02/18/15 2020 02/19/15 0438  NA 133* 137 141  K 4.1 4.1 4.2  CL 97* 104 108  CO2 19* 19* 20*  GLUCOSE 93 83 69  BUN 11 9 9   CREATININE 0.88 0.80 0.85  CALCIUM 8.0* 7.9* 8.1*   LFT  Recent Labs  02/19/15 0438  PROT 6.4*  ALBUMIN 3.7  AST 205*  ALT 22  ALKPHOS 89  BILITOT 0.9   PT/INR  Recent Labs  02/18/15 1522  LABPROT 13.7  INR 1.03     Studies/Results: Dg Chest Port 1 View  02/18/2015  CLINICAL DATA:  Hematemesis EXAM: PORTABLE CHEST - 1 VIEW COMPARISON:  10/21/2014 FINDINGS: The heart size and mediastinal contours are within normal limits. Both lungs are clear. The  visualized skeletal structures are unremarkable. IMPRESSION: No active disease. Electronically Signed   By: Inez Catalina M.D.   On: 02/18/2015 15:39    Impression/Plan: 49 yo with alcohol abuse and history of cocaine abuse with an episode of coffee grounds emesis. Suspect erosive esophagitis or alcoholic gastritis. Doubt peptic ulcer bleed or variceal bleed. Continue IV PPI Q 12 hours. Clear liquid diet. NPO p MN. EGD tomorrow. Supportive care.    LOS: 1 day   [place] C.  02/19/2015, 10:25 AM  Pager [phone]  If no answer or after 5 PM call [phone]

## 2015-02-20 NOTE — Progress Notes (Signed)
Patient ID: [name], male   DOB: 14-Dec-1965, 49 y.o.   MRN: [id number] [facility] PROGRESS NOTE  ARPAD HALDER ION:[id number] DOB: [birth year] DOA: Mar 08, 2015 PCP: [name], MD  Brief narrative:    49 year old male with past medical history of alcohol abuse and dependence, drug abuse (including cocaine in past), schizophrenia, depression, hypertension, questionable diabetes (takes metformin but not confirmed with PCP based on review of EPIC). Pt reported drinking vodka heavily on almost daily basis. Patient started having vomiting with blood on the day prior to the admission. No blood in the stool.  In ED, BP was 156/79, HR 122-147, RR 16. Blood work was notable for hemoglobin of 12.5, CO2 of 19. CXR showed no acute cardiopulmonary findings. He was started on protonix drip. ED consulted GI. He has not had any vomiting in ED. Alcohol was 307 on admission. UDS was WNL. He underwent EGD with findings of friable duodenal bulb lesion - s/p biopsies, mild antral gastritis and minimal distal erosive esophagitis.   Anticipated discharge: 11/24 if tolerates solids.  Assessment/Plan:    Principal Problem: Hematemesis / Upper GI bleed / Anemia of chronic disease  - EGD 02/20/2015 with findings of friable duodenal bulb lesion - s/p biopsies, mild antral gastritis, minimal distal erosive esophagitis.  - Change protonix to to PO PPI BID once tolerates solids  - Diet as tolerated   Active Problems: Alcohol dependence with intoxication with complication (HCC) / Acute alcohol intoxication (HCC) - Alcohol level 307 on the admission.  - Started on  CIWA  - No withdrawals  - Continue multivitamin, folic acid and thiamine   Tobacco abuse - Patient counseled on cessation  History of schizophrenia / Major depressive disorder, recurrent episode, moderate (HCC) / Cocaine abuse - Has a sitter at bedside. Psychiatry consulted for recommendation on medications. Stable, no changes in  medications. - D/C sitter today. - UDS WNL  Benign essential HTN - Continue Norvasc, pindolol and avapro  Diabetes mellitus without complications - Patient on metformin but not clear if he really has a history all for diabetes. Per review of Epic primary care physician has asked the patient in past to bring the medications during the visit but it hasn't been clear whether he really has diabetes - A1c 5.9 so safe to D/C metformin   Pulmonary nodule - Seen on CT scans in 2010, 2012. Never had follow up since 2012 - Repeat CT on outpt basis   Metabolic acidosis - Likely secondary to GI losses - Improving with fluids  DVT Prophylaxis  - SCD's bilaterally  Code Status: Full.  Family Communication:  plan of care discussed with the patient Disposition Plan: 11/24  IV access:  Peripheral IV  Procedures and diagnostic studies:    Dg Chest Port 1 View 03/08/2015  No active disease. Electronically Signed   By: Alcide Clever M.D.   On: 2015/03/08 15:39   EGD 02/20/2015  Medical Consultants:  [facility], Dr. Charlott Rakes   Other Consultants:  None   IAnti-Infectives:   None    [name], MD  [facility] Pager [phone]  Time spent in minutes: 15 minutes  If 7PM-7AM, please contact night-coverage [email or web address] Password [facility] 02/20/2015, 3:25 PM   LOS: 2 days    HPI/Subjective: No acute overnight events. No N/V.   Objective: Filed Vitals:   02/20/15 0930 02/20/15 0938 02/20/15 0952 02/20/15 1135  BP: 120/83  117/85 110/63  Pulse: 90 90 99 89  Temp:  97.7 F (36.5 C) 97.9 F (36.6 C)  TempSrc:   Oral Oral  Resp: 13 18 20 18   Height:      Weight:      SpO2: 97% 97% 98% 96%    Intake/Output Summary (Last 24 hours) at 02/20/15 1525 Last data filed at 02/20/15 1135  Gross per 24 hour  Intake   3040 ml  Output   2150 ml  Net    890 ml    Exam:   General:  Pt is not in acute distress  Cardiovascular: RRR, S1/S2  (+)  Respiratory: No wheezing, no crackles, no rhonchi  Abdomen: S(+) BS, non tender   Extremities: No leg swelling, pulses palpable bilaterally  Neuro: Nonfocal  Data Reviewed: Basic Metabolic Panel:  Recent Labs Lab 02/18/15 1522 02/18/15 2020 02/19/15 0438  NA 133* 137 141  K 4.1 4.1 4.2  CL 97* 104 108  CO2 19* 19* 20*  GLUCOSE 93 83 69  BUN 11 9 9   CREATININE 0.88 0.80 0.85  CALCIUM 8.0* 7.9* 8.1*  MG  --  2.0  --   PHOS  --  3.3  --    Liver Function Tests:  Recent Labs Lab 02/18/15 1522 02/18/15 2020 02/19/15 0438  AST 161* 189* 205*  ALT 20 20 22   ALKPHOS 104 95 89  BILITOT 1.1 0.8 0.9  PROT 7.0 6.6 6.4*  ALBUMIN 4.2 3.9 3.7   No results for input(s): LIPASE, AMYLASE in the last 168 hours. No results for input(s): AMMONIA in the last 168 hours. CBC:  Recent Labs Lab 02/18/15 1522 02/18/15 2020 02/19/15 0438 02/20/15 1250  WBC 10.4 9.7 7.8 5.6  NEUTROABS  --  6.6  --   --   HGB 12.5* 12.0* 11.9* 11.2*  HCT 37.0* 34.7* 34.7* 32.7*  MCV 92.7 89.9 92.5 93.4  PLT 237 209 214 179   Cardiac Enzymes: No results for input(s): CKTOTAL, CKMB, CKMBINDEX, TROPONINI in the last 168 hours. BNP: Invalid input(s): POCBNP CBG:  Recent Labs Lab 02/19/15 0728 02/20/15 0736  GLUCAP 83 88    No results found for this or any previous visit (from the past 240 hour(s)).   Scheduled Meds: . amLODipine  2.5 mg Oral Daily  . benztropine  1 mg Oral Daily  . DULoxetine  60 mg Oral q morning - 10a  . fluticasone  2 spray Each Nare Daily  . folic acid  1 mg Oral Daily  . irbesartan  150 mg Oral Daily  . metFORMIN  500 mg Oral Q breakfast  . multivitamin with minerals  1 tablet Oral Daily  . [START ON 02/22/2015] pantoprazole (PROTONIX) IV  40 mg Intravenous Q12H  . pindolol  5 mg Oral BID  . risperiDONE  2 mg Oral BID  . sodium chloride  3 mL Intravenous Q12H  . sucralfate  1 g Oral QID  . thiamine  100 mg Oral Daily   Continuous Infusions: . sodium  chloride 125 mL/hr at 02/20/15 1023

## 2015-02-20 NOTE — Op Note (Signed)
[facility] [place], [address]   ENDOSCOPY PROCEDURE REPORT  PATIENT: Justinian, [name]  MR#: [id number] BIRTHDATE: Jul 30, 1965 , 49  yrs. old GENDER: male ENDOSCOPIST: Wilford Corner, MD REFERRED BY: PROCEDURE DATE:  02/21/15 PROCEDURE:  EGD w/ biopsy ASA CLASS:     Class III INDICATIONS:  GI bleed. MEDICATIONS: Monitored anesthesia care and Per Anesthesia TOPICAL ANESTHETIC: Cetacaine Spray  DESCRIPTION OF PROCEDURE: After the risks benefits and alternatives of the procedure were thoroughly explained, informed consent was obtained.  The Pentax Gastroscope [id number] endoscope was introduced through the mouth and advanced to the second portion of the duodenum , Without limitations.  The instrument was slowly withdrawn as the mucosa was fully examined. Estimated blood loss is zero unless otherwise noted in this procedure report.    Minimal edema and erythema in the distal esophagus consistent with minimal esophagitis. GEJ 36 cm from the incisors. Mild distal erythema in antrum c/w antral gastritis. Polypoid lesion (2 cm in diameter) in duodenal bulb that is friable - s/p biopsies. 2nd portion of duodenum normal in appearance.         Retroflexed views revealed a hiatal hernia.     The scope was then withdrawn from the patient and the procedure completed.  COMPLICATIONS: There were no immediate complications.  ENDOSCOPIC IMPRESSION:     Duodenal polypoid lesion - s/p biopsies Mild antral gastritis Minimal distal erosive esophagitis Hiatal hernia  RECOMMENDATIONS:     F/U on path; PPI PO BID; Advance diet; Avoid NSAIDs and alcohol   eSigned:  Wilford Corner, MD 02/21/2015 9:14 AM    CC:  CPT CODES: ICD CODES:  The ICD and CPT codes recommended by this software are interpretations from the data that the clinical staff has captured with the software.  The verification of the translation of this report to the ICD and CPT codes and  modifiers is the sole responsibility of the health care institution and practicing physician where this report was generated.  [employer] will not be held responsible for the validity of the ICD and CPT codes included on this report.  AMA assumes no liability for data contained or not contained herein. CPT is a Designer, television/film set of the [employer].  PATIENT NAME:  [name], [name] MR#: [id number]

## 2015-02-21 LAB — GLUCOSE, CAPILLARY: Glucose-Capillary: 114 mg/dL — ABNORMAL HIGH (ref 65–99)

## 2015-02-21 MED ORDER — PANTOPRAZOLE SODIUM 40 MG PO TBEC: 40.0000 mg | DELAYED_RELEASE_TABLET | Freq: Two times a day (BID) | ORAL | Status: DC

## 2015-02-21 MED ORDER — SUCRALFATE 1 G PO TABS: 1.0000 g | ORAL_TABLET | Freq: Four times a day (QID) | ORAL | Status: DC

## 2015-02-21 NOTE — Discharge Instructions (Signed)
Gastritis, Adult Gastritis is soreness and swelling (inflammation) of the lining of the stomach. Gastritis can develop as a sudden onset (acute) or long-term (chronic) condition. If gastritis is not treated, it can lead to stomach bleeding and ulcers. CAUSES  Gastritis occurs when the stomach lining is weak or damaged. Digestive juices from the stomach then inflame the weakened stomach lining. The stomach lining may be weak or damaged due to viral or bacterial infections. One common bacterial infection is the Helicobacter pylori infection. Gastritis can also result from excessive alcohol consumption, taking certain medicines, or having too much acid in the stomach.  SYMPTOMS  In some cases, there are no symptoms. When symptoms are present, they may include:  Pain or a burning sensation in the upper abdomen.  Nausea.  Vomiting.  An uncomfortable feeling of fullness after eating. DIAGNOSIS  Your caregiver may suspect you have gastritis based on your symptoms and a physical exam. To determine the cause of your gastritis, your caregiver may perform the following:  Blood or stool tests to check for the H pylori bacterium.  Gastroscopy. A thin, flexible tube (endoscope) is passed down the esophagus and into the stomach. The endoscope has a light and camera on the end. Your caregiver uses the endoscope to view the inside of the stomach.  Taking a tissue sample (biopsy) from the stomach to examine under a microscope. TREATMENT  Depending on the cause of your gastritis, medicines may be prescribed. If you have a bacterial infection, such as an H pylori infection, antibiotics may be given. If your gastritis is caused by too much acid in the stomach, H2 blockers or antacids may be given. Your caregiver may recommend that you stop taking aspirin, ibuprofen, or other nonsteroidal anti-inflammatory drugs (NSAIDs). HOME CARE INSTRUCTIONS  Only take over-the-counter or prescription medicines as directed by  your caregiver.  If you were given antibiotic medicines, take them as directed. Finish them even if you start to feel better.  Drink enough fluids to keep your urine clear or pale yellow.  Avoid foods and drinks that make your symptoms worse, such as:  Caffeine or alcoholic drinks.  Chocolate.  Peppermint or mint flavorings.  Garlic and onions.  Spicy foods.  Citrus fruits, such as oranges, lemons, or limes.  Tomato-based foods such as sauce, chili, salsa, and pizza.  Fried and fatty foods.  Eat small, frequent meals instead of large meals. SEEK IMMEDIATE MEDICAL CARE IF:   You have black or dark red stools.  You vomit blood or material that looks like coffee grounds.  You are unable to keep fluids down.  Your abdominal pain gets worse.  You have a fever.  You do not feel better after 1 week.  You have any other questions or concerns. MAKE SURE YOU:  Understand these instructions.  Will watch your condition.  Will get help right away if you are not doing well or get worse.   This information is not intended to replace advice given to you by your health care provider. Make sure you discuss any questions you have with your health care provider.   Document Released: 03/10/2001 Document Revised: 09/15/2011 Document Reviewed: 04/29/2011 Elsevier Interactive Patient Education 2016 Elsevier Inc. Pantoprazole tablets What is this medicine? PANTOPRAZOLE (pan TOE pra zole) prevents the production of acid in the stomach. It is used to treat gastroesophageal reflux disease (GERD), inflammation of the esophagus, and Zollinger-Ellison syndrome. This medicine may be used for other purposes; ask your health care provider or pharmacist  if you have questions. What should I tell my health care provider before I take this medicine? They need to know if you have any of these conditions: -liver disease -low levels of magnesium in the blood -an unusual or allergic reaction to  omeprazole, lansoprazole, pantoprazole, rabeprazole, other medicines, foods, dyes, or preservatives -pregnant or trying to get pregnant -breast-feeding How should I use this medicine? Take this medicine by mouth. Swallow the tablets whole with a drink of water. Follow the directions on the prescription label. Do not crush, break, or chew. Take your medicine at regular intervals. Do not take your medicine more often than directed. Talk to your pediatrician regarding the use of this medicine in children. While this drug may be prescribed for children as young as 5 years for selected conditions, precautions do apply. Overdosage: If you think you have taken too much of this medicine contact a poison control center or emergency room at once. NOTE: This medicine is only for you. Do not share this medicine with others. What if I miss a dose? If you miss a dose, take it as soon as you can. If it is almost time for your next dose, take only that dose. Do not take double or extra doses. What may interact with this medicine? Do not take this medicine with any of the following medications: -atazanavir -nelfinavir This medicine may also interact with the following medications: -ampicillin -delavirdine -erlotinib -iron salts -medicines for fungal infections like ketoconazole, itraconazole and voriconazole -methotrexate -mycophenolate mofetil -warfarin This list may not describe all possible interactions. Give your health care provider a list of all the medicines, herbs, non-prescription drugs, or dietary supplements you use. Also tell them if you smoke, drink alcohol, or use illegal drugs. Some items may interact with your medicine. What should I watch for while using this medicine? It can take several days before your stomach pain gets better. Check with your doctor or health care professional if your condition does not start to get better, or if it gets worse. You may need blood work done while you are  taking this medicine. What side effects may I notice from receiving this medicine? Side effects that you should report to your doctor or health care professional as soon as possible: -allergic reactions like skin rash, itching or hives, swelling of the face, lips, or tongue -bone, muscle or joint pain -breathing problems -chest pain or chest tightness -dark yellow or brown urine -dizziness -fast, irregular heartbeat -feeling faint or lightheaded -fever or sore throat -muscle spasm -palpitations -redness, blistering, peeling or loosening of the skin, including inside the mouth -seizures -tremors -unusual bleeding or bruising -unusually weak or tired -yellowing of the eyes or skin Side effects that usually do not require medical attention (Report these to your doctor or health care professional if they continue or are bothersome.): -constipation -diarrhea -dry mouth -headache -nausea This list may not describe all possible side effects. Call your doctor for medical advice about side effects. You may report side effects to FDA at 1-800-FDA-1088. Where should I keep my medicine? Keep out of the reach of children. Store at room temperature between 15 and 30 degrees C (59 and 86 degrees F). Protect from light and moisture. Throw away any unused medicine after the expiration date. NOTE: This sheet is a summary. It may not cover all possible information. If you have questions about this medicine, talk to your doctor, pharmacist, or health care provider.    2016, Elsevier/Gold Standard. (2014-05-04 14:45:56)

## 2015-02-21 NOTE — Progress Notes (Signed)
Pt taken to lobby to meet ride.

## 2015-02-21 NOTE — Progress Notes (Signed)
Pt evaluated by by Dr. Charlies Silvers and is discharge home with the ACT team who will be picking him up at noon. Pt is in agreement with plans. Pt awake and alert. Has no complaints at this time.

## 2015-02-21 NOTE — Discharge Summary (Addendum)
Physician Discharge Summary  [name]:[id number] DOB: 11-23-1965 DOA: 02/18/2015  PCP: [name], MD  Admit date: 02/18/2015 Discharge date: 02/21/2015  Recommendations for Outpatient Follow-up:  1. Continue Protonix 40 mg twice daily and follow-up with GI per scheduled appointment for follow-up results of biopsy.  Discharge summary will be forwarded to patient's primary care physician to obtain repeat CT scan for evaluation of previously found pulmonary nodules back in 2012.  Discharge Diagnoses:  Principal Problem:   Alcohol dependence with intoxication with complication (HCC) Active Problems:   Hematemesis   Upper GI bleed   Acute alcohol intoxication (HCC)   Tobacco abuse   History of schizophrenia   Cocaine abuse   Major depressive disorder, recurrent episode, moderate (HCC)   Benign essential HTN   Anemia of chronic disease   Pulmonary nodule   Metabolic acidosis    Discharge Condition: stable   Diet recommendation: as tolerated   History of present illness:  49 year old male with past medical history of alcohol abuse and dependence, drug abuse (including cocaine in past), schizophrenia, depression, hypertension, questionable diabetes (takes metformin but not confirmed with PCP based on review of EPIC). Pt reported drinking vodka heavily on almost daily basis. Patient started having vomiting with blood on the day prior to the admission. No blood in the stool.  In ED, BP was 156/79, HR 122-147, RR 16. Blood work was notable for hemoglobin of 12.5, CO2 of 19. CXR showed no acute cardiopulmonary findings. He was started on protonix drip. ED consulted GI. He has not had any vomiting in ED. Alcohol was 307 on admission. UDS was WNL. He underwent EGD with findings of friable duodenal bulb lesion - s/p biopsies, mild antral gastritis and minimal distal erosive esophagitis.   Hospital Course:   Assessment/Plan:    Principal Problem: Hematemesis /  Upper duodenal GI bleed / Anemia of chronic disease  - EGD 02/20/2015 showed friable duodenal bulb lesion - s/p biopsies, mild antral gastritis, minimal distal erosive esophagitis.  - Patient was on Protonix milligrams IV every 12 hours and we will switch to by mouth regimen. He will continue by mouth Protonix 40 mg twice daily. He has to follow up with GI on an outpatient basis to check biopsy results. - Diet as tolerated   Active Problems: Alcohol dependence with intoxication with complication (HCC) / Acute alcohol intoxication (HCC) - Alcohol level 307 on the admission.  - Started on CIWA  - No reports of it controls during this hospital stay - Continue multivitamin, folic acid and thiamine on discharge  Tobacco abuse - Patient counseled on cessation  History of schizophrenia / Major depressive disorder, recurrent episode, moderate (HCC) / Cocaine abuse - Patient was seen by psychiatry who recommended continuing current medications - Patient is stable, does not feel suicidal or depressed - He has had a sitter at the bedside which was discontinued 02/20/2015.  Benign essential HTN - Continue Norvasc, pindolol and avaproOn discharge  Diabetes mellitus without complications - Patient on metformin but not clear if he really has a history all for diabetes. Per review of Epic primary care physician has asked the patient in past to bring the medications during the visit but it hasn't been clear whether he really has diabetes - A1c 5.9 so safe to D/C metformin   Pulmonary nodule - Seen on CT scans in 2010, 2012. Never had follow up since 2012 - Repeat CT on outpt basis as indicated above  Metabolic acidosis -  Likely secondary to GI losses - Improving with fluids  DVT Prophylaxis  - SCD's bilaterally in-hospital  Code Status: Full.  Family Communication: plan of care discussed with the patient   IV access:  Peripheral IV  Procedures and diagnostic studies:   Dg  Chest Port 1 View Feb 28, 2015 No active disease. Electronically Signed By: Alcide Clever M.D. On: 28-Feb-2015 15:39   EGD 02/20/2015  Medical Consultants:  [facility], Dr. Charlott Rakes   Other Consultants:  None   IAnti-Infectives:   None    Signed:  [name], MD  [facility] 02/21/2015, 9:16 AM  Pager #: [phone]  Time spent in minutes: less than 30 minutes    Discharge Exam: Filed Vitals:   02/20/15 2040 02/21/15 0514  BP: 113/87 115/85  Pulse: 93 82  Temp: 98.3 F (36.8 C) 98.1 F (36.7 C)  Resp:  18   Filed Vitals:   02/20/15 1135 02/20/15 2040 02/21/15 0500 02/21/15 0514  BP: 110/63 113/87  115/85  Pulse: 89 93  82  Temp: 97.9 F (36.6 C) 98.3 F (36.8 C)  98.1 F (36.7 C)  TempSrc: Oral Oral  Oral  Resp: 18   18  Height:      Weight:   102.331 kg (225 lb 9.6 oz)   SpO2: 96% 99%  98%    General: Pt is alert, follows commands appropriately, not in acute distress Cardiovascular: Regular rate and rhythm, S1/S2 +, no murmurs Respiratory: Clear to auscultation bilaterally, no wheezing, no crackles, no rhonchi Abdominal: Soft, non tender, non distended, bowel sounds +, no guarding Extremities: no edema, no cyanosis, pulses palpable bilaterally DP and PT Neuro: Grossly nonfocal  Discharge Instructions  Discharge Instructions    Call MD for:  difficulty breathing, headache or visual disturbances    Complete by:  As directed      Call MD for:  persistant dizziness or light-headedness    Complete by:  As directed      Call MD for:  persistant nausea and vomiting    Complete by:  As directed      Call MD for:  severe uncontrolled pain    Complete by:  As directed      Diet - low sodium heart healthy    Complete by:  As directed      Discharge instructions    Complete by:  As directed   1. Continue Protonix 40 mg twice daily and follow-up with GI per scheduled appointment for follow-up results of biopsy.      Increase activity slowly    Complete by:  As directed             Medication List    STOP taking these medications        metFORMIN 500 MG (MOD) 24 hr tablet  Commonly known as:  GLUMETZA     oxyCODONE-acetaminophen 5-325 MG tablet  Commonly known as:  PERCOCET/ROXICET     traMADol 50 MG tablet  Commonly known as:  ULTRAM      TAKE these medications        acamprosate 333 MG tablet  Commonly known as:  CAMPRAL  Take 2 tablets (666 mg total) by mouth 3 (three) times daily with meals.     albuterol 108 (90 BASE) MCG/ACT inhaler  Commonly known as:  PROVENTIL HFA;VENTOLIN HFA  Inhale 1 puff into the lungs 2 (two) times daily as needed for wheezing or shortness of breath.     amLODipine 2.5 MG tablet  Commonly known as:  NORVASC  Take 2.5 mg by mouth daily.     benztropine 1 MG tablet  Commonly known as:  COGENTIN  Take 1 tablet (1 mg total) by mouth daily. For prevention of drug induced movements     DULoxetine 60 MG capsule  Commonly known as:  CYMBALTA  Take 60 mg by mouth 2 (two) times daily.     folic acid 1 MG tablet  Commonly known as:  FOLVITE  Take 1 tablet (1 mg total) by mouth daily. For low folate     multivitamin with minerals Tabs tablet  Take 1 tablet by mouth daily. For low vitamin     pantoprazole 40 MG tablet  Commonly known as:  PROTONIX  Take 1 tablet (40 mg total) by mouth 2 (two) times daily. For acid reflux     pindolol 5 MG tablet  Commonly known as:  VISKEN  Take 1 tablet (5 mg total) by mouth 2 (two) times daily. For high blood pressure     risperidone 4 MG tablet  Commonly known as:  RISPERDAL  Take 1 tablet (4 mg total) by mouth 2 (two) times daily. For mood control     sucralfate 1 G tablet  Commonly known as:  CARAFATE  Take 1 tablet (1 g total) by mouth 4 (four) times daily. For acid reflux     valsartan 160 MG tablet  Commonly known as:  DIOVAN  Take 160 mg by mouth daily.           Follow-up Information    Follow  up with [name], MD. Schedule an appointment as soon as possible for a visit in 1 week.   Specialty:  Family Medicine   Why:  Follow up appt after recent hospitalization   Contact information:   [address] [facility] [place] [address] [phone]        The results of significant diagnostics from this hospitalization (including imaging, microbiology, ancillary and laboratory) are listed below for reference.    Significant Diagnostic Studies: Dg Chest Port 1 View  02/18/2015  CLINICAL DATA:  Hematemesis EXAM: PORTABLE CHEST - 1 VIEW COMPARISON:  10/21/2014 FINDINGS: The heart size and mediastinal contours are within normal limits. Both lungs are clear. The visualized skeletal structures are unremarkable. IMPRESSION: No active disease. Electronically Signed   By: Alcide Clever M.D.   On: 02/18/2015 15:39    Microbiology: No results found for this or any previous visit (from the past 240 hour(s)).   Labs: Basic Metabolic Panel:  Recent Labs Lab 02/18/15 1522 02/18/15 2020 02/19/15 0438  NA 133* 137 141  K 4.1 4.1 4.2  CL 97* 104 108  CO2 19* 19* 20*  GLUCOSE 93 83 69  BUN 11 9 9   CREATININE 0.88 0.80 0.85  CALCIUM 8.0* 7.9* 8.1*  MG  --  2.0  --   PHOS  --  3.3  --    Liver Function Tests:  Recent Labs Lab 02/18/15 1522 02/18/15 2020 02/19/15 0438  AST 161* 189* 205*  ALT 20 20 22   ALKPHOS 104 95 89  BILITOT 1.1 0.8 0.9  PROT 7.0 6.6 6.4*  ALBUMIN 4.2 3.9 3.7   No results for input(s): LIPASE, AMYLASE in the last 168 hours. No results for input(s): AMMONIA in the last 168 hours. CBC:  Recent Labs Lab 02/18/15 1522 02/18/15 2020 02/19/15 0438 02/20/15 1250  WBC 10.4 9.7 7.8 5.6  NEUTROABS  --  6.6  --   --   HGB 12.5* 12.0* 11.9* 11.2*  HCT 37.0* 34.7* 34.7* 32.7*  MCV 92.7 89.9 92.5 93.4  PLT 237 209 214 179   Cardiac Enzymes: No results for input(s): CKTOTAL, CKMB, CKMBINDEX, TROPONINI in the last 168  hours. BNP: BNP (last 3 results) No results for input(s): BNP in the last 8760 hours.  ProBNP (last 3 results) No results for input(s): PROBNP in the last 8760 hours.  CBG:  Recent Labs Lab 02/19/15 0728 02/20/15 0736 02/21/15 0728  GLUCAP 83 88 114*

## 2015-02-22 ENCOUNTER — Encounter (HOSPITAL_COMMUNITY): Payer: Self-pay | Admitting: Gastroenterology

## 2015-02-25 DIAGNOSIS — F331 Major depressive disorder, recurrent, moderate: Secondary | ICD-10-CM | POA: Insufficient documentation

## 2015-02-25 DIAGNOSIS — F1020 Alcohol dependence, uncomplicated: Secondary | ICD-10-CM | POA: Insufficient documentation

## 2015-03-05 DIAGNOSIS — R911 Solitary pulmonary nodule: Secondary | ICD-10-CM | POA: Diagnosis not present

## 2015-03-05 DIAGNOSIS — Z09 Encounter for follow-up examination after completed treatment for conditions other than malignant neoplasm: Secondary | ICD-10-CM | POA: Diagnosis not present

## 2015-03-05 DIAGNOSIS — K209 Esophagitis, unspecified: Secondary | ICD-10-CM | POA: Diagnosis not present

## 2015-03-14 DIAGNOSIS — M19172 Post-traumatic osteoarthritis, left ankle and foot: Secondary | ICD-10-CM | POA: Diagnosis not present

## 2015-04-18 DIAGNOSIS — Z716 Tobacco abuse counseling: Secondary | ICD-10-CM | POA: Diagnosis not present

## 2015-04-18 DIAGNOSIS — K3189 Other diseases of stomach and duodenum: Secondary | ICD-10-CM | POA: Diagnosis not present

## 2015-04-18 DIAGNOSIS — E119 Type 2 diabetes mellitus without complications: Secondary | ICD-10-CM | POA: Diagnosis not present

## 2015-05-09 DIAGNOSIS — K920 Hematemesis: Secondary | ICD-10-CM | POA: Diagnosis not present

## 2015-05-09 DIAGNOSIS — K921 Melena: Secondary | ICD-10-CM | POA: Diagnosis not present

## 2015-05-09 DIAGNOSIS — K3189 Other diseases of stomach and duodenum: Secondary | ICD-10-CM | POA: Diagnosis not present

## 2015-05-09 DIAGNOSIS — D132 Benign neoplasm of duodenum: Secondary | ICD-10-CM | POA: Diagnosis not present

## 2015-05-28 ENCOUNTER — Ambulatory Visit (INDEPENDENT_AMBULATORY_CARE_PROVIDER_SITE_OTHER): Payer: Medicare Other | Admitting: Family Medicine

## 2015-05-28 ENCOUNTER — Ambulatory Visit (INDEPENDENT_AMBULATORY_CARE_PROVIDER_SITE_OTHER): Payer: Medicare Other

## 2015-05-28 VITALS — BP 106/72 | HR 68 | Temp 98.9°F | Resp 18 | Ht 68.11 in | Wt 236.2 lb

## 2015-05-28 DIAGNOSIS — J9801 Acute bronchospasm: Secondary | ICD-10-CM

## 2015-05-28 DIAGNOSIS — R05 Cough: Secondary | ICD-10-CM | POA: Diagnosis not present

## 2015-05-28 DIAGNOSIS — R059 Cough, unspecified: Secondary | ICD-10-CM

## 2015-05-28 DIAGNOSIS — R0602 Shortness of breath: Secondary | ICD-10-CM | POA: Diagnosis not present

## 2015-05-28 DIAGNOSIS — R509 Fever, unspecified: Secondary | ICD-10-CM

## 2015-05-28 LAB — POCT CBC
Granulocyte percent: 69.8 %G (ref 37–80)
HCT, POC: 36.7 % — AB (ref 43.5–53.7)
HEMOGLOBIN: 13.1 g/dL — AB (ref 14.1–18.1)
LYMPH, POC: 1.9 (ref 0.6–3.4)
MCH, POC: 31.2 pg (ref 27–31.2)
MCHC: 35.7 g/dL — AB (ref 31.8–35.4)
MCV: 97.3 fL — AB (ref 80–97)
MID (cbc): 0.6 (ref 0–0.9)
MPV: 7.8 fL (ref 0–99.8)
POC Granulocyte: 5.9 (ref 2–6.9)
POC LYMPH PERCENT: 22.5 %L (ref 10–50)
POC MID %: 7.7 %M (ref 0–12)
Platelet Count, POC: 194 10*3/uL (ref 142–424)
RBC: 4.2 M/uL — AB (ref 4.69–6.13)
RDW, POC: 14 %
WBC: 8.4 10*3/uL (ref 4.6–10.2)

## 2015-05-28 LAB — POCT INFLUENZA A/B
INFLUENZA A, POC: NEGATIVE
Influenza B, POC: NEGATIVE

## 2015-05-28 MED ORDER — AZITHROMYCIN 250 MG PO TABS: ORAL_TABLET | ORAL | Status: DC

## 2015-05-28 NOTE — Progress Notes (Signed)
Subjective:  By signing my name below, I, [name], attest that this documentation has been prepared under the direction and in the presence of Brian Staggers, MD.  [name], Medical Scribe. 05/28/2015.  1:48 PM.   Patient ID: [name], male    DOB: Dec 12, 1965, 50 y.o.   MRN: [id number]  Chief Complaint  Patient presents with  . Cough   HPI HPI Comments: [name] is a 50 y.o. male who presents to [facility] complaining of possible URI, gradual onset 3 days ago. Pt has a hx of tobacco use, depression, alcohol abuse, and pulmonary nodule noted in 01/2015, however on x-ray 09/2014 no apparent opasity, but there was a diffused prominent interstitial markings without focal acute findings. On 1 view chest xray om 11/21, no active disease. Lung nodule was noted in 11/2013 by CT of the chest with a previous right apical nodule that has resolved.   Pt reports associated symptoms of worsening cough, fever Tmax 100.3 onset last night, sore throat, congestion, and wheezing. Pt takes albuterol 2 dosages per day, he does recall for what he is taking this for, he denies recent increase in use for this onset of symptoms.  Pt is not taking any OTC medications for his symptoms. He is UTD with the flu vaccine. Pt notes no sick contact. He denies recent travels. He reports no history of heart failure, COPD, or emphysema.   Patient Active Problem List   Diagnosis Date Noted  . Upper GI bleed 02/18/2015  . Benign essential HTN 02/18/2015  . Anemia of chronic disease 02/18/2015  . Acute alcohol intoxication (HCC) 02/18/2015  . Pulmonary nodule 02/18/2015  . Metabolic acidosis 02/18/2015  . Alcohol dependence with intoxication with complication (HCC)   . Major depressive disorder, recurrent episode, moderate (HCC)   . Cocaine abuse 03/02/2014  . History of schizophrenia 03/01/2014  . Tobacco abuse 12/06/2013  . Hematemesis 12/04/2013   History reviewed. No  pertinent past medical history. Past Surgical History  Procedure Laterality Date  . Esophagogastroduodenoscopy Left 12/05/2013    Procedure: ESOPHAGOGASTRODUODENOSCOPY (EGD);  Surgeon: Willis Modena, MD;  Location: [facility];  Service: Endoscopy;  Laterality: Left;  . Esophagogastroduodenoscopy (egd) with propofol N/A 02/20/2015    Procedure: ESOPHAGOGASTRODUODENOSCOPY (EGD) WITH PROPOFOL;  Surgeon: Charlott Rakes, MD;  Location: [facility];  Service: Endoscopy;  Laterality: N/A;   Allergies  Allergen Reactions  . Aspirin Swelling    Throat swelling    Prior to Admission medications   Medication Sig Start Date End Date Taking? Authorizing Provider  acamprosate (CAMPRAL) 333 MG tablet Take 2 tablets (666 mg total) by mouth 3 (three) times daily with meals. 03/05/14  Yes Sanjuana Kava, NP  albuterol (PROVENTIL HFA;VENTOLIN HFA) 108 (90 BASE) MCG/ACT inhaler Inhale 1 puff into the lungs 2 (two) times daily as needed for wheezing or shortness of breath. 03/05/14  Yes Sanjuana Kava, NP  benztropine (COGENTIN) 1 MG tablet Take 1 tablet (1 mg total) by mouth daily. For prevention of drug induced movements 03/05/14  Yes Sanjuana Kava, NP  DULoxetine (CYMBALTA) 60 MG capsule Take 60 mg by mouth 2 (two) times daily. 11/15/13  Yes Historical Provider, MD  folic acid (FOLVITE) 1 MG tablet Take 1 tablet (1 mg total) by mouth daily. For low folate 03/05/14  Yes Sanjuana Kava, NP  Multiple Vitamin (MULTIVITAMIN WITH MINERALS) TABS tablet Take 1 tablet by mouth daily. For low vitamin 03/05/14  Yes  Sanjuana Kava, NP  pindolol (VISKEN) 5 MG tablet Take 1 tablet (5 mg total) by mouth 2 (two) times daily. For high blood pressure 03/05/14  Yes Sanjuana Kava, NP  risperiDONE (RISPERDAL) 4 MG tablet Take 1 tablet (4 mg total) by mouth 2 (two) times daily. For mood control 03/05/14  Yes Sanjuana Kava, NP  sucralfate (CARAFATE) 1 G tablet Take 1 tablet (1 g total) by mouth 4 (four) times daily. For acid reflux 02/21/15   Yes Alison Murray, MD  valsartan (DIOVAN) 160 MG tablet Take 160 mg by mouth daily. 07/17/14  Yes Historical Provider, MD  pantoprazole (PROTONIX) 40 MG tablet Take 1 tablet (40 mg total) by mouth 2 (two) times daily. For acid reflux Patient not taking: Reported on 05/28/2015 02/21/15   Alison Murray, MD   Social History   Social History  . Marital Status: Single    Spouse Name: N/A  . Number of Children: N/A  . Years of Education: N/A   Occupational History  . Not on file.   Social History Main Topics  . Smoking status: Heavy Tobacco Smoker -- 1.50 packs/day for 13 years    Types: Cigarettes  . Smokeless tobacco: Never Used  . Alcohol Use: 0.0 oz/week    0 Standard drinks or equivalent per week     Comment: pt states he isn't sure but drank at least 7-8 40s tonight  . Drug Use: No  . Sexual Activity: Yes   Other Topics Concern  . Not on file   Social History Narrative    Review of Systems  Constitutional: Positive for fever.  HENT: Positive for congestion and sore throat.   Respiratory: Positive for cough and wheezing.       Objective:   Physical Exam  Constitutional: He is oriented to person, place, and time. He appears well-developed and well-nourished. No distress.  HENT:  Head: Normocephalic and atraumatic.  Right Ear: Tympanic membrane, external ear and ear canal normal.  Left Ear: Tympanic membrane, external ear and ear canal normal.  Nose: No rhinorrhea.  Mouth/Throat: Oropharynx is clear and moist and mucous membranes are normal. No oropharyngeal exudate or posterior oropharyngeal erythema.  Erythema of the posterior oropharynx. No exudate.   Eyes: Conjunctivae and EOM are normal. Pupils are equal, round, and reactive to light.  Neck: Neck supple.  Cardiovascular: Normal rate, regular rhythm, normal heart sounds and intact distal pulses.   No murmur heard. Pulmonary/Chest: Effort normal. He has wheezes (Expiratory wheeze). He has no rhonchi. He has no  rales.  Otherwise clear. Normal effort.   Abdominal: Soft. There is no tenderness.  Lymphadenopathy:    He has no cervical adenopathy.  Neurological: He is alert and oriented to person, place, and time. No cranial nerve deficit.  Skin: Skin is warm and dry. No rash noted.  Psychiatric: He has a normal mood and affect. His behavior is normal.  Nursing note and vitals reviewed.   Filed Vitals:   05/28/15 1202  BP: 106/72  Pulse: 68  Temp: 98.9 F (37.2 C)  TempSrc: Oral  Resp: 18  Height: 5' 8.11" (1.73 m)  Weight: 236 lb 3.2 oz (107.14 kg)  SpO2: 98%   Dg Chest 2 View  05/28/2015  CLINICAL DATA:  Shortness of breath, smoking history EXAM: CHEST  2 VIEW COMPARISON:  Chest x-ray of 02/18/2015 FINDINGS: No active infiltrate or effusion is seen. There are prominent perihilar markings present with peribronchial thickening most consistent with bronchitis,  possibly chronic in this patient with a smoking history. The heart is within normal limits in size. No bony abnormality is seen. IMPRESSION: No pneumonia.  Probable bronchitis, possibly chronic. Electronically Signed   By: Dwyane Dee M.D.   On: 05/28/2015 14:06    Results for orders placed or performed in visit on 05/28/15  POCT Influenza A/B  Result Value Ref Range   Influenza A, POC Negative Negative   Influenza B, POC Negative Negative  POCT CBC  Result Value Ref Range   WBC 8.4 4.6 - 10.2 K/uL   Lymph, poc 1.9 0.6 - 3.4   POC LYMPH PERCENT 22.5 10 - 50 %L   MID (cbc) 0.6 0 - 0.9   POC MID % 7.7 0 - 12 %M   POC Granulocyte 5.9 2 - 6.9   Granulocyte percent 69.8 37 - 80 %G   RBC 4.20 (A) 4.69 - 6.13 M/uL   Hemoglobin 13.1 (A) 14.1 - 18.1 g/dL   HCT, POC 40.3 (A) 47.4 - 53.7 %   MCV 97.3 (A) 80 - 97 fL   MCH, POC 31.2 27 - 31.2 pg   MCHC 35.7 (A) 31.8 - 35.4 g/dL   RDW, POC 25.9 %   Platelet Count, POC 194 142 - 424 K/uL   MPV 7.8 0 - 99.8 fL      Assessment & Plan:   XZAYVION FLAIM is a 50 y.o. male Cough - Plan:  POCT Influenza A/B, DG Chest 2 View, POCT CBC, azithromycin (ZITHROMAX) 250 MG tablet  Fever, unspecified - Plan: POCT Influenza A/B, DG Chest 2 View, POCT CBC, azithromycin (ZITHROMAX) 250 MG tablet  Bronchospasm - Plan: azithromycin (ZITHROMAX) 250 MG tablet  Suspected underlying COPD with copd flair vs other LRTI. O2 sat ok. Bronchitis by CXR.   -can increase albuterol up to Q4-6h, but if persistent in creased need - RTC as in AVS   -azithromycin - Zpak.   -mucinex otc ok to try, but discontinue if this increases wheeze. other sx care as in AVS and RTC precautions discussed.   Meds ordered this encounter  Medications  . azithromycin (ZITHROMAX) 250 MG tablet    Sig: Take 2 pills by mouth on day 1, then 1 pill by mouth per day on days 2 through 5.    Dispense:  6 tablet    Refill:  0   Patient Instructions  Because you received an x-ray today, you will receive an invoice from [facility]. Please contact [facility] at [phone] with questions or concerns regarding your invoice. Our billing staff will not be able to assist you with those questions.  Your flu test was negative, blood count overall looked okay. Chest x-ray indicated possible bronchitis or chronic bronchitis which we discussed can sometimes be COPD. You can use the albuterol up to every 4-6 hours, ok to start antibiotic, but if you do have to use albuterol frequently in the next 2-3 days, you may need to be on prednisone as well. If fevers persist, or persistent need for albuterol in 3 days, return here for other care provider for recheck.  You can try mucinex for the cough, but if it is causing you to wheeze more, stop the Mucinex. Cepacol or other cough drops as needed over-the-counter for sore throat.   Return to the clinic or go to the nearest emergency room if any of your symptoms worsen or new symptoms occur.  Bronchospasm, Adult A bronchospasm is a spasm or tightening of the  airways going into  the lungs. During a bronchospasm breathing becomes more difficult because the airways get smaller. When this happens there can be coughing, a whistling sound when breathing (wheezing), and difficulty breathing. Bronchospasm is often associated with asthma, but not all patients who experience a bronchospasm have asthma. CAUSES  A bronchospasm is caused by inflammation or irritation of the airways. The inflammation or irritation may be triggered by:   Allergies (such as to animals, pollen, food, or mold). Allergens that cause bronchospasm may cause wheezing immediately after exposure or many hours later.   Infection. Viral infections are believed to be the most common cause of bronchospasm.   Exercise.   Irritants (such as pollution, cigarette smoke, strong odors, aerosol sprays, and paint fumes).   Weather changes. Winds increase molds and pollens in the air. Rain refreshes the air by washing irritants out. Cold air may cause inflammation.   Stress and emotional upset.  SIGNS AND SYMPTOMS   Wheezing.   Excessive nighttime coughing.   Frequent or severe coughing with a simple cold.   Chest tightness.   Shortness of breath.  DIAGNOSIS  Bronchospasm is usually diagnosed through a history and physical exam. Tests, such as chest X-rays, are sometimes done to look for other conditions. TREATMENT   Inhaled medicines can be given to open up your airways and help you breathe. The medicines can be given using either an inhaler or a nebulizer machine.  Corticosteroid medicines may be given for severe bronchospasm, usually when it is associated with asthma. HOME CARE INSTRUCTIONS   Always have a plan prepared for seeking medical care. Know when to call your health care provider and local emergency services (911 in the [place] Know where you can access local emergency care.  Only take medicines as directed by your health care provider.  If you were prescribed an inhaler or  nebulizer machine, ask your health care provider to explain how to use it correctly. Always use a spacer with your inhaler if you were given one.  It is necessary to remain calm during an attack. Try to relax and breathe more slowly.  Control your home environment in the following ways:   Change your heating and air conditioning filter at least once a month.   Limit your use of fireplaces and wood stoves.  Do not smoke and do not allow smoking in your home.   Avoid exposure to perfumes and fragrances.   Get rid of pests (such as roaches and mice) and their droppings.   Throw away plants if you see mold on them.   Keep your house clean and dust free.   Replace carpet with wood, tile, or vinyl flooring. Carpet can trap dander and dust.   Use allergy-proof pillows, mattress covers, and box spring covers.   Wash bed sheets and blankets every week in hot water and dry them in a dryer.   Use blankets that are made of polyester or cotton.   Wash hands frequently. SEEK MEDICAL CARE IF:   You have muscle aches.   You have chest pain.   The sputum changes from clear or white to yellow, green, gray, or bloody.   The sputum you cough up gets thicker.   There are problems that may be related to the medicine you are given, such as a rash, itching, swelling, or trouble breathing.  SEEK IMMEDIATE MEDICAL CARE IF:   You have worsening wheezing and coughing even after taking your prescribed medicines.   You have  increased difficulty breathing.   You develop severe chest pain. MAKE SURE YOU:   Understand these instructions.  Will watch your condition.  Will get help right away if you are not doing well or get worse.   This information is not intended to replace advice given to you by your health care provider. Make sure you discuss any questions you have with your health care provider.   Document Released: 03/19/2003 Document Revised: 04/06/2014 Document  Reviewed: 09/05/2012 Elsevier Interactive Patient Education 2016 [employer]    Acute Bronchitis Bronchitis is inflammation of the airways that extend from the windpipe into the lungs (bronchi). The inflammation often causes mucus to develop. This leads to a cough, which is the most common symptom of bronchitis.  In acute bronchitis, the condition usually develops suddenly and goes away over time, usually in a couple weeks. Smoking, allergies, and asthma can make bronchitis worse. Repeated episodes of bronchitis may cause further lung problems.  CAUSES Acute bronchitis is most often caused by the same virus that causes a cold. The virus can spread from person to person (contagious) through coughing, sneezing, and touching contaminated objects. SIGNS AND SYMPTOMS   Cough.   Fever.   Coughing up mucus.   Body aches.   Chest congestion.   Chills.   Shortness of breath.   Sore throat.  DIAGNOSIS  Acute bronchitis is usually diagnosed through a physical exam. Your health care provider will also ask you questions about your medical history. Tests, such as chest X-rays, are sometimes done to rule out other conditions.  TREATMENT  Acute bronchitis usually goes away in a couple weeks. Oftentimes, no medical treatment is necessary. Medicines are sometimes given for relief of fever or cough. Antibiotic medicines are usually not needed but may be prescribed in certain situations. In some cases, an inhaler may be recommended to help reduce shortness of breath and control the cough. A cool mist vaporizer may also be used to help thin bronchial secretions and make it easier to clear the chest.  HOME CARE INSTRUCTIONS  Get plenty of rest.   Drink enough fluids to keep your urine clear or pale yellow (unless you have a medical condition that requires fluid restriction). Increasing fluids may help thin your respiratory secretions (sputum) and reduce chest congestion, and it will prevent  dehydration.   Take medicines only as directed by your health care provider.  If you were prescribed an antibiotic medicine, finish it all even if you start to feel better.  Avoid smoking and secondhand smoke. Exposure to cigarette smoke or irritating chemicals will make bronchitis worse. If you are a smoker, consider using nicotine gum or skin patches to help control withdrawal symptoms. Quitting smoking will help your lungs heal faster.   Reduce the chances of another bout of acute bronchitis by washing your hands frequently, avoiding people with cold symptoms, and trying not to touch your hands to your mouth, nose, or eyes.   Keep all follow-up visits as directed by your health care provider.  SEEK MEDICAL CARE IF: Your symptoms do not improve after 1 week of treatment.  SEEK IMMEDIATE MEDICAL CARE IF:  You develop an increased fever or chills.   You have chest pain.   You have severe shortness of breath.  You have bloody sputum.   You develop dehydration.  You faint or repeatedly feel like you are going to pass out.  You develop repeated vomiting.  You develop a severe headache. MAKE SURE YOU:  Understand these instructions.  Will watch your condition.  Will get help right away if you are not doing well or get worse.   This information is not intended to replace advice given to you by your health care provider. Make sure you discuss any questions you have with your health care provider.   Document Released: 04/23/2004 Document Revised: 04/06/2014 Document Reviewed: 09/06/2012 Elsevier Interactive Patient Education [employer]     I personally performed the services described in this documentation, which was scribed in my presence. The recorded information has been reviewed and considered, and addended by me as needed.

## 2015-05-28 NOTE — Patient Instructions (Addendum)
Because you received an x-ray today, you will receive an invoice from [facility]. Please contact [facility] at [phone] with questions or concerns regarding your invoice. Our billing staff will not be able to assist you with those questions.  Your flu test was negative, blood count overall looked okay. Chest x-ray indicated possible bronchitis or chronic bronchitis which we discussed can sometimes be COPD. You can use the albuterol up to every 4-6 hours, ok to start antibiotic, but if you do have to use albuterol frequently in the next 2-3 days, you may need to be on prednisone as well. If fevers persist, or persistent need for albuterol in 3 days, return here for other care provider for recheck.  You can try mucinex for the cough, but if it is causing you to wheeze more, stop the Mucinex. Cepacol or other cough drops as needed over-the-counter for sore throat.   Return to the clinic or go to the nearest emergency room if any of your symptoms worsen or new symptoms occur.  Bronchospasm, Adult A bronchospasm is a spasm or tightening of the airways going into the lungs. During a bronchospasm breathing becomes more difficult because the airways get smaller. When this happens there can be coughing, a whistling sound when breathing (wheezing), and difficulty breathing. Bronchospasm is often associated with asthma, but not all patients who experience a bronchospasm have asthma. CAUSES  A bronchospasm is caused by inflammation or irritation of the airways. The inflammation or irritation may be triggered by:   Allergies (such as to animals, pollen, food, or mold). Allergens that cause bronchospasm may cause wheezing immediately after exposure or many hours later.   Infection. Viral infections are believed to be the most common cause of bronchospasm.   Exercise.   Irritants (such as pollution, cigarette smoke, strong odors, aerosol sprays, and paint fumes).   Weather changes.  Winds increase molds and pollens in the air. Rain refreshes the air by washing irritants out. Cold air may cause inflammation.   Stress and emotional upset.  SIGNS AND SYMPTOMS   Wheezing.   Excessive nighttime coughing.   Frequent or severe coughing with a simple cold.   Chest tightness.   Shortness of breath.  DIAGNOSIS  Bronchospasm is usually diagnosed through a history and physical exam. Tests, such as chest X-rays, are sometimes done to look for other conditions. TREATMENT   Inhaled medicines can be given to open up your airways and help you breathe. The medicines can be given using either an inhaler or a nebulizer machine.  Corticosteroid medicines may be given for severe bronchospasm, usually when it is associated with asthma. HOME CARE INSTRUCTIONS   Always have a plan prepared for seeking medical care. Know when to call your health care provider and local emergency services (911 in the [place] Know where you can access local emergency care.  Only take medicines as directed by your health care provider.  If you were prescribed an inhaler or nebulizer machine, ask your health care provider to explain how to use it correctly. Always use a spacer with your inhaler if you were given one.  It is necessary to remain calm during an attack. Try to relax and breathe more slowly.  Control your home environment in the following ways:   Change your heating and air conditioning filter at least once a month.   Limit your use of fireplaces and wood stoves.  Do not smoke and do not allow smoking in your home.   Avoid exposure  to perfumes and fragrances.   Get rid of pests (such as roaches and mice) and their droppings.   Throw away plants if you see mold on them.   Keep your house clean and dust free.   Replace carpet with wood, tile, or vinyl flooring. Carpet can trap dander and dust.   Use allergy-proof pillows, mattress covers, and box spring covers.    Wash bed sheets and blankets every week in hot water and dry them in a dryer.   Use blankets that are made of polyester or cotton.   Wash hands frequently. SEEK MEDICAL CARE IF:   You have muscle aches.   You have chest pain.   The sputum changes from clear or white to yellow, green, gray, or bloody.   The sputum you cough up gets thicker.   There are problems that may be related to the medicine you are given, such as a rash, itching, swelling, or trouble breathing.  SEEK IMMEDIATE MEDICAL CARE IF:   You have worsening wheezing and coughing even after taking your prescribed medicines.   You have increased difficulty breathing.   You develop severe chest pain. MAKE SURE YOU:   Understand these instructions.  Will watch your condition.  Will get help right away if you are not doing well or get worse.   This information is not intended to replace advice given to you by your health care provider. Make sure you discuss any questions you have with your health care provider.   Document Released: 03/19/2003 Document Revised: 04/06/2014 Document Reviewed: 09/05/2012 Elsevier Interactive Patient Education 2016 Elsevier Inc.    Acute Bronchitis Bronchitis is inflammation of the airways that extend from the windpipe into the lungs (bronchi). The inflammation often causes mucus to develop. This leads to a cough, which is the most common symptom of bronchitis.  In acute bronchitis, the condition usually develops suddenly and goes away over time, usually in a couple weeks. Smoking, allergies, and asthma can make bronchitis worse. Repeated episodes of bronchitis may cause further lung problems.  CAUSES Acute bronchitis is most often caused by the same virus that causes a cold. The virus can spread from person to person (contagious) through coughing, sneezing, and touching contaminated objects. SIGNS AND SYMPTOMS   Cough.   Fever.   Coughing up mucus.   Body aches.    Chest congestion.   Chills.   Shortness of breath.   Sore throat.  DIAGNOSIS  Acute bronchitis is usually diagnosed through a physical exam. Your health care provider will also ask you questions about your medical history. Tests, such as chest X-rays, are sometimes done to rule out other conditions.  TREATMENT  Acute bronchitis usually goes away in a couple weeks. Oftentimes, no medical treatment is necessary. Medicines are sometimes given for relief of fever or cough. Antibiotic medicines are usually not needed but may be prescribed in certain situations. In some cases, an inhaler may be recommended to help reduce shortness of breath and control the cough. A cool mist vaporizer may also be used to help thin bronchial secretions and make it easier to clear the chest.  HOME CARE INSTRUCTIONS  Get plenty of rest.   Drink enough fluids to keep your urine clear or pale yellow (unless you have a medical condition that requires fluid restriction). Increasing fluids may help thin your respiratory secretions (sputum) and reduce chest congestion, and it will prevent dehydration.   Take medicines only as directed by your health care provider.  If  you were prescribed an antibiotic medicine, finish it all even if you start to feel better.  Avoid smoking and secondhand smoke. Exposure to cigarette smoke or irritating chemicals will make bronchitis worse. If you are a smoker, consider using nicotine gum or skin patches to help control withdrawal symptoms. Quitting smoking will help your lungs heal faster.   Reduce the chances of another bout of acute bronchitis by washing your hands frequently, avoiding people with cold symptoms, and trying not to touch your hands to your mouth, nose, or eyes.   Keep all follow-up visits as directed by your health care provider.  SEEK MEDICAL CARE IF: Your symptoms do not improve after 1 week of treatment.  SEEK IMMEDIATE MEDICAL CARE IF:  You develop  an increased fever or chills.   You have chest pain.   You have severe shortness of breath.  You have bloody sputum.   You develop dehydration.  You faint or repeatedly feel like you are going to pass out.  You develop repeated vomiting.  You develop a severe headache. MAKE SURE YOU:   Understand these instructions.  Will watch your condition.  Will get help right away if you are not doing well or get worse.   This information is not intended to replace advice given to you by your health care provider. Make sure you discuss any questions you have with your health care provider.   Document Released: 04/23/2004 Document Revised: 04/06/2014 Document Reviewed: 09/06/2012 Elsevier Interactive Patient Education [employer].

## 2015-06-04 DIAGNOSIS — K317 Polyp of stomach and duodenum: Secondary | ICD-10-CM | POA: Insufficient documentation

## 2015-06-04 DIAGNOSIS — J069 Acute upper respiratory infection, unspecified: Secondary | ICD-10-CM | POA: Diagnosis not present

## 2015-07-09 DIAGNOSIS — J4530 Mild persistent asthma, uncomplicated: Secondary | ICD-10-CM | POA: Insufficient documentation

## 2015-07-09 DIAGNOSIS — E119 Type 2 diabetes mellitus without complications: Secondary | ICD-10-CM | POA: Insufficient documentation

## 2015-07-09 DIAGNOSIS — K219 Gastro-esophageal reflux disease without esophagitis: Secondary | ICD-10-CM | POA: Insufficient documentation

## 2015-07-09 HISTORY — DX: Type 2 diabetes mellitus without complications: E11.9

## 2015-07-16 DIAGNOSIS — Z1211 Encounter for screening for malignant neoplasm of colon: Secondary | ICD-10-CM | POA: Diagnosis not present

## 2015-07-16 DIAGNOSIS — D132 Benign neoplasm of duodenum: Secondary | ICD-10-CM | POA: Diagnosis not present

## 2015-07-16 DIAGNOSIS — F259 Schizoaffective disorder, unspecified: Secondary | ICD-10-CM | POA: Diagnosis not present

## 2015-07-16 DIAGNOSIS — E119 Type 2 diabetes mellitus without complications: Secondary | ICD-10-CM | POA: Diagnosis not present

## 2015-07-16 DIAGNOSIS — Z8673 Personal history of transient ischemic attack (TIA), and cerebral infarction without residual deficits: Secondary | ICD-10-CM | POA: Diagnosis not present

## 2015-07-16 DIAGNOSIS — K219 Gastro-esophageal reflux disease without esophagitis: Secondary | ICD-10-CM | POA: Diagnosis not present

## 2015-07-16 DIAGNOSIS — K317 Polyp of stomach and duodenum: Secondary | ICD-10-CM | POA: Diagnosis not present

## 2015-07-16 DIAGNOSIS — F17210 Nicotine dependence, cigarettes, uncomplicated: Secondary | ICD-10-CM | POA: Diagnosis not present

## 2015-07-16 DIAGNOSIS — J4530 Mild persistent asthma, uncomplicated: Secondary | ICD-10-CM | POA: Diagnosis not present

## 2015-07-16 DIAGNOSIS — I10 Essential (primary) hypertension: Secondary | ICD-10-CM | POA: Diagnosis not present

## 2015-07-16 DIAGNOSIS — K229 Disease of esophagus, unspecified: Secondary | ICD-10-CM | POA: Diagnosis not present

## 2015-08-27 DIAGNOSIS — J069 Acute upper respiratory infection, unspecified: Secondary | ICD-10-CM | POA: Diagnosis not present

## 2015-09-04 DIAGNOSIS — F17200 Nicotine dependence, unspecified, uncomplicated: Secondary | ICD-10-CM | POA: Diagnosis not present

## 2015-09-04 DIAGNOSIS — J40 Bronchitis, not specified as acute or chronic: Secondary | ICD-10-CM | POA: Diagnosis not present

## 2015-09-04 DIAGNOSIS — Z716 Tobacco abuse counseling: Secondary | ICD-10-CM | POA: Diagnosis not present

## 2015-11-11 DIAGNOSIS — J0100 Acute maxillary sinusitis, unspecified: Secondary | ICD-10-CM | POA: Diagnosis not present

## 2015-12-18 DIAGNOSIS — H6122 Impacted cerumen, left ear: Secondary | ICD-10-CM | POA: Diagnosis not present

## 2015-12-18 DIAGNOSIS — H6981 Other specified disorders of Eustachian tube, right ear: Secondary | ICD-10-CM | POA: Diagnosis not present

## 2015-12-18 DIAGNOSIS — Z23 Encounter for immunization: Secondary | ICD-10-CM | POA: Diagnosis not present

## 2015-12-18 DIAGNOSIS — H9201 Otalgia, right ear: Secondary | ICD-10-CM | POA: Diagnosis not present

## 2016-01-13 DIAGNOSIS — J40 Bronchitis, not specified as acute or chronic: Secondary | ICD-10-CM | POA: Diagnosis not present

## 2016-01-13 DIAGNOSIS — R61 Generalized hyperhidrosis: Secondary | ICD-10-CM | POA: Diagnosis not present

## 2016-02-04 DIAGNOSIS — S8991XS Unspecified injury of right lower leg, sequela: Secondary | ICD-10-CM | POA: Diagnosis not present

## 2016-02-06 ENCOUNTER — Emergency Department (HOSPITAL_COMMUNITY): Payer: Medicare Other

## 2016-02-06 ENCOUNTER — Emergency Department (HOSPITAL_COMMUNITY)
Admission: EM | Admit: 2016-02-06 | Discharge: 2016-02-06 | Disposition: A | Discharge status: Home / Self Care | Payer: Medicare Other | Attending: Emergency Medicine | Admitting: Emergency Medicine

## 2016-02-06 ENCOUNTER — Encounter (HOSPITAL_COMMUNITY): Payer: Self-pay | Admitting: Emergency Medicine

## 2016-02-06 DIAGNOSIS — G8929 Other chronic pain: Secondary | ICD-10-CM

## 2016-02-06 DIAGNOSIS — F17210 Nicotine dependence, cigarettes, uncomplicated: Secondary | ICD-10-CM | POA: Insufficient documentation

## 2016-02-06 DIAGNOSIS — M25561 Pain in right knee: Secondary | ICD-10-CM | POA: Diagnosis not present

## 2016-02-06 DIAGNOSIS — T148XXA Other injury of unspecified body region, initial encounter: Secondary | ICD-10-CM | POA: Diagnosis not present

## 2016-02-06 DIAGNOSIS — I10 Essential (primary) hypertension: Secondary | ICD-10-CM | POA: Insufficient documentation

## 2016-02-06 DIAGNOSIS — M25461 Effusion, right knee: Secondary | ICD-10-CM | POA: Diagnosis not present

## 2016-02-06 MED ORDER — LIDOCAINE HCL 2 % IJ SOLN
5.0000 mL | Freq: Once | INTRAMUSCULAR | Status: AC
  Administered 2016-02-06: 100 mg
  Filled 2016-02-06: qty 20

## 2016-02-06 MED ORDER — HYDROCODONE-ACETAMINOPHEN 5-325 MG PO TABS
1.0000 | ORAL_TABLET | Freq: Once | ORAL | Status: AC
  Administered 2016-02-06: 1 via ORAL
  Filled 2016-02-06: qty 1

## 2016-02-06 MED ORDER — METHYLPREDNISOLONE SODIUM SUCC 125 MG IJ SOLR
80.0000 mg | Freq: Once | INTRAMUSCULAR | Status: AC
  Administered 2016-02-06: 80 mg via INTRAVENOUS
  Filled 2016-02-06: qty 2

## 2016-02-06 MED ORDER — HYDROCODONE-ACETAMINOPHEN 5-325 MG PO TABS: 1.0000 | ORAL_TABLET | Freq: Four times a day (QID) | ORAL | 0 refills | Status: DC | PRN

## 2016-02-06 NOTE — ED Triage Notes (Addendum)
Per EMS: Pt c/o rt knee pain x 2 weeks.  Started having swelling two days ago.  Fell in the shower (slip and fall) this morning.  Positive PMS.  Shooting pain down rt leg.  Knee is more swollen now.  Ambulatory without assistance.

## 2016-02-06 NOTE — ED Provider Notes (Signed)
[facility] DEPT Provider Note   CSN: [id number] Arrival date & time: 02/06/16  1145   By signing my name below, I, Avnee Patel, attest that this documentation has been prepared under the direction and in the presence of  Shawn Joy, PA-C. Electronically Signed: Delton Prairie, ED Scribe. 02/06/16. 1:09 PM.   History   Chief Complaint Chief Complaint  Patient presents with  . Fall  . Joint Swelling  . Knee Pain   The history is provided by the patient. No language interpreter was used.   HPI Comments:  [name] is a 50 y.o. male who presents to the Emergency Department, via EMS, complaining of worsening moderate chronic right knee x 3 weeks. Pt notes associated swelling x 2 weeks. Pain is exacerbated with movement which also causes shooting pain down his leg. Three weeks ago he stumbled over an object on the floor and his pain increased after this event. States his knee "gave out" this morning causing him to have to sit on the floor of the shower. Pt notes he needs a right knee replacement, but this has been delayed by the orthopedic surgeon until patient quits smoking. No alleviating factors noted. Denies neuro deficits, neck/back pain, fever/chills, or any other complaints.  Denies history of HIV or IV drug use.  History reviewed. No pertinent past medical history.  Patient Active Problem List   Diagnosis Date Noted  . Upper GI bleed 02/18/2015  . Benign essential HTN 02/18/2015  . Anemia of chronic disease 02/18/2015  . Acute alcohol intoxication ([place]) 02/18/2015  . Pulmonary nodule 02/18/2015  . Metabolic acidosis 123456  . Alcohol dependence with intoxication with complication ([place])   . Major depressive disorder, recurrent episode, moderate ([place])   . Cocaine abuse 03/02/2014  . History of schizophrenia 03/01/2014  . Tobacco abuse 12/06/2013  . Hematemesis 12/04/2013    Past Surgical History:  Procedure Laterality Date  . ESOPHAGOGASTRODUODENOSCOPY Left  12/05/2013   Procedure: ESOPHAGOGASTRODUODENOSCOPY (EGD);  Surgeon: Arta Silence, MD;  Location: [facility];  Service: Endoscopy;  Laterality: Left;  . ESOPHAGOGASTRODUODENOSCOPY (EGD) WITH PROPOFOL N/A 02/20/2015   Procedure: ESOPHAGOGASTRODUODENOSCOPY (EGD) WITH PROPOFOL;  Surgeon: Wilford Corner, MD;  Location: [facility];  Service: Endoscopy;  Laterality: N/A;    Home Medications    Prior to Admission medications   Medication Sig Start Date End Date Taking? Authorizing Provider  acamprosate (CAMPRAL) 333 MG tablet Take 2 tablets (666 mg total) by mouth 3 (three) times daily with meals. 03/05/14   Encarnacion Slates, NP  albuterol (PROVENTIL HFA;VENTOLIN HFA) 108 (90 BASE) MCG/ACT inhaler Inhale 1 puff into the lungs 2 (two) times daily as needed for wheezing or shortness of breath. 03/05/14   Encarnacion Slates, NP  azithromycin (ZITHROMAX) 250 MG tablet Take 2 pills by mouth on day 1, then 1 pill by mouth per day on days 2 through 5. 05/28/15   Wendie Agreste, MD  benztropine (COGENTIN) 1 MG tablet Take 1 tablet (1 mg total) by mouth daily. For prevention of drug induced movements 03/05/14   Encarnacion Slates, NP  DULoxetine (CYMBALTA) 60 MG capsule Take 60 mg by mouth 2 (two) times daily. 11/15/13   Historical Provider, MD  folic acid (FOLVITE) 1 MG tablet Take 1 tablet (1 mg total) by mouth daily. For low folate 03/05/14   Encarnacion Slates, NP  HYDROcodone-acetaminophen (NORCO/VICODIN) 5-325 MG tablet Take 1-2 tablets by mouth every 6 (six) hours as needed. 02/06/16   Lorayne Bender, PA-C  Multiple Vitamin (MULTIVITAMIN WITH MINERALS) TABS tablet Take 1 tablet by mouth daily. For low vitamin 03/05/14   Encarnacion Slates, NP  pantoprazole (PROTONIX) 40 MG tablet Take 1 tablet (40 mg total) by mouth 2 (two) times daily. For acid reflux Patient not taking: Reported on 05/28/2015 02/21/15   Robbie Lis, MD  pindolol (VISKEN) 5 MG tablet Take 1 tablet (5 mg total) by mouth 2 (two) times daily. For high blood pressure  03/05/14   Encarnacion Slates, NP  risperiDONE (RISPERDAL) 4 MG tablet Take 1 tablet (4 mg total) by mouth 2 (two) times daily. For mood control 03/05/14   Encarnacion Slates, NP  sucralfate (CARAFATE) 1 G tablet Take 1 tablet (1 g total) by mouth 4 (four) times daily. For acid reflux 02/21/15   Robbie Lis, MD  valsartan (DIOVAN) 160 MG tablet Take 160 mg by mouth daily. 07/17/14   Historical Provider, MD    Family History No family history on file.  Social History Social History  Substance Use Topics  . Smoking status: Heavy Tobacco Smoker    Packs/day: 1.50    Years: 13.00    Types: Cigarettes  . Smokeless tobacco: Never Used  . Alcohol use 0.0 oz/week     Comment: pt states he isn't sure but drank at least 7-8 40s tonight     Allergies   Aspirin   Review of Systems Review of Systems  Musculoskeletal: Positive for arthralgias, joint swelling and myalgias.  Neurological: Negative for weakness and numbness.  All other systems reviewed and are negative.    Physical Exam Updated Vital Signs BP 125/95 (BP Location: Right Arm)   Pulse 105   Temp 98.7 F (37.1 C) (Oral)   SpO2 98%   Physical Exam  Constitutional: He appears well-developed and well-nourished. No distress.  HENT:  Head: Normocephalic and atraumatic.  Eyes: Conjunctivae are normal.  Neck: Neck supple.  Cardiovascular: Normal rate, regular rhythm and intact distal pulses.   Pulmonary/Chest: Effort normal. No respiratory distress.  Abdominal: He exhibits no distension.  Musculoskeletal: He exhibits edema.  Varus and Valgus stress to the right knee elicits pain on the lateral side. No laxity, erythema, increased warmth, or tenderness. Marked swelling to the right knee with probable effusion. Range of motion intact.  Lymphadenopathy:    He has no cervical adenopathy.  Neurological: He is alert.  No sensory deficits. Strength 5 out of 5 in bilateral lower extremities.   Skin: Skin is warm and dry. He is not  diaphoretic.  Psychiatric: He has a normal mood and affect. His behavior is normal.  Nursing note and vitals reviewed.    ED Treatments / Results  DIAGNOSTIC STUDIES:  Oxygen Saturation is 98% on RA, normal by my interpretation.    COORDINATION OF CARE:  1:06 PM Discussed treatment plan with pt at bedside and pt agreed to plan.  Labs (all labs ordered are listed, but only abnormal results are displayed) Labs Reviewed - No data to display  EKG  EKG Interpretation None       Radiology Dg Knee Complete 4 Views Right  Result Date: 02/06/2016 CLINICAL DATA:  Anterior and lateral knee pain after tripping over a suitcase 3 weeks ago and falling EXAM: RIGHT KNEE - COMPLETE 4+ VIEW COMPARISON:  06/29/2009 FINDINGS: No evidence of fracture. No subluxation or dislocation. Loss of joint space noted medial compartment. Hypertrophic spurring visible all 3 compartments. Joint effusion is evident. IMPRESSION: Tricompartmental degenerative changes with joint effusion.  No acute bony findings. Electronically Signed   By: Misty Stanley M.D.   On: 02/06/2016 13:02    Procedures .Joint Aspiration/Arthrocentesis Date/Time: 02/06/2016 1:55 PM Performed by: Lorayne Bender Authorized by: Lorayne Bender   Consent:    Consent obtained:  Verbal   Consent given by:  Patient   Risks discussed:  Bleeding, infection, pain, nerve damage and incomplete drainage   Alternatives discussed:  Referral and observation Location:    Location:  Knee   Knee:  R knee Anesthesia (see MAR for exact dosages):    Anesthesia method:  Local infiltration   Local anesthetic:  Lidocaine 2% w/o epi Procedure details:    Preparation: Patient was prepped and draped in usual sterile fashion     Needle gauge:  18 G   Ultrasound guidance: no     Approach:  Superior   Aspirate amount:  30cc   Aspirate characteristics:  Serous, yellow and clear   Steroid injected: yes     Specimen collected: no   Post-procedure details:     Dressing:  Sterile dressing and adhesive bandage   Patient tolerance of procedure:  Tolerated well, no immediate complications Comments:     Patient felt immediate relief following the procedure. Injection of joint Date/Time: 02/06/2016 1:56 PM Performed by: Lorayne Bender Authorized by: Arlean Hopping C  Consent: Verbal consent obtained. Consent given by: patient Patient understanding: patient states understanding of the procedure being performed Patient consent: the patient's understanding of the procedure matches consent given Procedure consent: procedure consent matches procedure scheduled Patient identity confirmed: verbally with patient and arm band Time out: Immediately prior to procedure a "time out" was called to verify the correct patient, procedure, equipment, support staff and site/side marked as required. Preparation: Patient was prepped and draped in the usual sterile fashion. Local anesthesia used: yes Anesthesia: local infiltration  Anesthesia: Local anesthesia used: yes Local Anesthetic: lidocaine 2% without epinephrine Anesthetic total: 2 mL  Sedation: Patient sedated: no Patient tolerance: Patient tolerated the procedure well with no immediate complications Comments: Injected with 125 mg Solumedrol and 2% lidocaine    (including critical care time)  Medications Ordered in ED Medications  HYDROcodone-acetaminophen (NORCO/VICODIN) 5-325 MG per tablet 1 tablet (1 tablet Oral Given 02/06/16 1314)  methylPREDNISolone sodium succinate (SOLU-MEDROL) 125 mg/2 mL injection 80 mg (80 mg Intravenous Given 02/06/16 1332)  lidocaine (XYLOCAINE) 2 % (with pres) injection 100 mg (100 mg Other Given 02/06/16 1333)     Initial Impression / Assessment and Plan / ED Course  I have reviewed the triage vital signs and the nursing notes.  Pertinent labs & imaging results that were available during my care of the patient were reviewed by me and considered in my medical decision making  (see chart for details).  Clinical Course     Patient presents with acute on chronic right knee pain for the last 3 weeks. Effusion noted on exam and on x-ray. Patient's complaint of having difficulty with full flexion and extension is likely due to the effusion. This resolved following arthrocentesis. I do not think patient presentation and exam findings are consistent with septic joint. Therapeutic arthrocentesis performed without immediate complication. Patient voices immediate relief. Orthopedic follow-up. The patient was given instructions for home care as well as return precautions. Patient voices understanding of these instructions, accepts the plan, and is comfortable with discharge.  Vitals:   02/06/16 1156 02/06/16 1413  BP: 125/95   Pulse: 105 96  Temp: 98.7 F (37.1  C)   TempSrc: Oral   SpO2: 98% 99%       Final Clinical Impressions(s) / ED Diagnoses   Final diagnoses:  Chronic pain of right knee  Effusion of right knee    New Prescriptions Discharge Medication List as of 02/06/2016  2:00 PM    START taking these medications   Details  HYDROcodone-acetaminophen (NORCO/VICODIN) 5-325 MG tablet Take 1-2 tablets by mouth every 6 (six) hours as needed., Starting Thu 02/06/2016, Print       I personally performed the services described in this documentation, which was scribed in my presence. The recorded information has been reviewed and is accurate.    Lorayne Bender, PA-C 02/06/16 [place], MD 02/08/16 1028

## 2016-02-06 NOTE — Discharge Instructions (Signed)
There was a significant amount of fluid that was able to be drawn off the knee. Keep the bandage on for 24 hours to reduce swelling. After this, change the bandage daily and reapply bacitracin or Neosporin ointment. Follow up with the orthopedic surgeon early next week.

## 2016-02-23 ENCOUNTER — Emergency Department (HOSPITAL_COMMUNITY): Payer: Medicare Other

## 2016-02-23 ENCOUNTER — Emergency Department (HOSPITAL_COMMUNITY)
Admission: EM | Admit: 2016-02-23 | Discharge: 2016-02-23 | Disposition: A | Discharge status: Home / Self Care | Payer: Medicare Other | Attending: Emergency Medicine | Admitting: Emergency Medicine

## 2016-02-23 ENCOUNTER — Encounter (HOSPITAL_COMMUNITY): Payer: Self-pay

## 2016-02-23 DIAGNOSIS — Y93G3 Activity, cooking and baking: Secondary | ICD-10-CM | POA: Insufficient documentation

## 2016-02-23 DIAGNOSIS — I10 Essential (primary) hypertension: Secondary | ICD-10-CM | POA: Diagnosis not present

## 2016-02-23 DIAGNOSIS — W1839XA Other fall on same level, initial encounter: Secondary | ICD-10-CM | POA: Insufficient documentation

## 2016-02-23 DIAGNOSIS — M25561 Pain in right knee: Secondary | ICD-10-CM | POA: Insufficient documentation

## 2016-02-23 DIAGNOSIS — Z79899 Other long term (current) drug therapy: Secondary | ICD-10-CM | POA: Diagnosis not present

## 2016-02-23 DIAGNOSIS — F17210 Nicotine dependence, cigarettes, uncomplicated: Secondary | ICD-10-CM | POA: Diagnosis not present

## 2016-02-23 DIAGNOSIS — Y929 Unspecified place or not applicable: Secondary | ICD-10-CM | POA: Diagnosis not present

## 2016-02-23 DIAGNOSIS — M25569 Pain in unspecified knee: Secondary | ICD-10-CM

## 2016-02-23 DIAGNOSIS — Y999 Unspecified external cause status: Secondary | ICD-10-CM | POA: Diagnosis not present

## 2016-02-23 DIAGNOSIS — R52 Pain, unspecified: Secondary | ICD-10-CM | POA: Diagnosis not present

## 2016-02-23 DIAGNOSIS — S8991XA Unspecified injury of right lower leg, initial encounter: Secondary | ICD-10-CM | POA: Diagnosis not present

## 2016-02-23 MED ORDER — TRAMADOL HCL 50 MG PO TABS: 50.0000 mg | ORAL_TABLET | Freq: Four times a day (QID) | ORAL | 0 refills | Status: DC | PRN

## 2016-02-23 NOTE — ED Triage Notes (Signed)
Pt presents via EMS from home with c/o right knee injury. Pt reports that he was cooking breakfast and his right leg "gave out", he collapsed, and hit his right knee on the floor. Pt has a previous injury to that right knee.

## 2016-02-23 NOTE — ED Provider Notes (Signed)
[facility] DEPT Provider Note   CSN: [id number] Arrival date & time: 02/23/16  1118  By signing my name below, I, Gwenlyn Fudge, attest that this documentation has been prepared under the direction and in the presence of [employer], PA-C. Electronically Signed: Gwenlyn Fudge, ED Scribe. 02/23/16. 12:31 PM.  History   Chief Complaint Chief Complaint  Patient presents with  . Knee Injury   The history is provided by the patient. No language interpreter was used.   HPI Comments:  SHAIQUAN PETRI is a 50 y.o. male who presents to the Emergency Department complaining of  constant, moderate right knee pain onset 3-4 weeks PTA. Pt was seen 3 weeks ago in ED after his right leg "gave out" on him and caused him to collapse. Today, pt experience a similar fall in which his knee "gave out" and caused him to collapse. He reports associated joint swelling. Pain is exacerbated with weight bearing. Pt uses a cane to walk. He denies LOC.   History reviewed. No pertinent past medical history.  Patient Active Problem List   Diagnosis Date Noted  . Upper GI bleed 02/18/2015  . Benign essential HTN 02/18/2015  . Anemia of chronic disease 02/18/2015  . Acute alcohol intoxication ([place]) 02/18/2015  . Pulmonary nodule 02/18/2015  . Metabolic acidosis 123456  . Alcohol dependence with intoxication with complication ([place])   . Major depressive disorder, recurrent episode, moderate ([place])   . Cocaine abuse 03/02/2014  . History of schizophrenia 03/01/2014  . Tobacco abuse 12/06/2013  . Hematemesis 12/04/2013    Past Surgical History:  Procedure Laterality Date  . ESOPHAGOGASTRODUODENOSCOPY Left 12/05/2013   Procedure: ESOPHAGOGASTRODUODENOSCOPY (EGD);  Surgeon: Arta Silence, MD;  Location: [facility];  Service: Endoscopy;  Laterality: Left;  . ESOPHAGOGASTRODUODENOSCOPY (EGD) WITH PROPOFOL N/A 02/20/2015   Procedure: ESOPHAGOGASTRODUODENOSCOPY (EGD) WITH PROPOFOL;  Surgeon: Wilford Corner, MD;  Location: [facility];  Service: Endoscopy;  Laterality: N/A;    Home Medications    Prior to Admission medications   Medication Sig Start Date End Date Taking? Authorizing Provider  acamprosate (CAMPRAL) 333 MG tablet Take 2 tablets (666 mg total) by mouth 3 (three) times daily with meals. 03/05/14   Encarnacion Slates, NP  albuterol (PROVENTIL HFA;VENTOLIN HFA) 108 (90 BASE) MCG/ACT inhaler Inhale 1 puff into the lungs 2 (two) times daily as needed for wheezing or shortness of breath. 03/05/14   Encarnacion Slates, NP  azithromycin (ZITHROMAX) 250 MG tablet Take 2 pills by mouth on day 1, then 1 pill by mouth per day on days 2 through 5. 05/28/15   Wendie Agreste, MD  benztropine (COGENTIN) 1 MG tablet Take 1 tablet (1 mg total) by mouth daily. For prevention of drug induced movements 03/05/14   Encarnacion Slates, NP  DULoxetine (CYMBALTA) 60 MG capsule Take 60 mg by mouth 2 (two) times daily. 11/15/13   Historical Provider, MD  folic acid (FOLVITE) 1 MG tablet Take 1 tablet (1 mg total) by mouth daily. For low folate 03/05/14   Encarnacion Slates, NP  HYDROcodone-acetaminophen (NORCO/VICODIN) 5-325 MG tablet Take 1-2 tablets by mouth every 6 (six) hours as needed. 02/06/16   Shawn C Joy, PA-C  Multiple Vitamin (MULTIVITAMIN WITH MINERALS) TABS tablet Take 1 tablet by mouth daily. For low vitamin 03/05/14   Encarnacion Slates, NP  pantoprazole (PROTONIX) 40 MG tablet Take 1 tablet (40 mg total) by mouth 2 (two) times daily. For acid reflux Patient not taking: Reported on 05/28/2015 02/21/15  Robbie Lis, MD  pindolol (VISKEN) 5 MG tablet Take 1 tablet (5 mg total) by mouth 2 (two) times daily. For high blood pressure 03/05/14   Encarnacion Slates, NP  risperiDONE (RISPERDAL) 4 MG tablet Take 1 tablet (4 mg total) by mouth 2 (two) times daily. For mood control 03/05/14   Encarnacion Slates, NP  sucralfate (CARAFATE) 1 G tablet Take 1 tablet (1 g total) by mouth 4 (four) times daily. For acid reflux 02/21/15   Robbie Lis, MD  traMADol (ULTRAM) 50 MG tablet Take 1 tablet (50 mg total) by mouth every 6 (six) hours as needed. 02/23/16   Okey Regal, PA-C  valsartan (DIOVAN) 160 MG tablet Take 160 mg by mouth daily. 07/17/14   Historical Provider, MD    Family History No family history on file.  Social History Social History  Substance Use Topics  . Smoking status: Heavy Tobacco Smoker    Packs/day: 1.50    Years: 13.00    Types: Cigarettes  . Smokeless tobacco: Never Used  . Alcohol use 0.0 oz/week     Comment: pt states he isn't sure but drank at least 7-8 40s tonight     Allergies   Aspirin  Review of Systems Review of Systems  Musculoskeletal: Positive for arthralgias and joint swelling.  Neurological: Negative for syncope.  All other systems reviewed and are negative.  Physical Exam Updated Vital Signs BP 118/89 (BP Location: Left Arm)   Pulse 104   Temp 97.9 F (36.6 C) (Oral)   Resp 16   Ht 5\' 9"  (1.753 m)   Wt 101.6 kg   SpO2 99%   BMI 33.08 kg/m   Physical Exam  Constitutional: He is oriented to person, place, and time. He appears well-developed and well-nourished. He is active. No distress.  HENT:  Head: Normocephalic and atraumatic.  Eyes: Conjunctivae are normal.  Cardiovascular: Normal rate.   Pulmonary/Chest: Effort normal. No respiratory distress.  Musculoskeletal: Normal range of motion. He exhibits edema.  Obvious swelling noted to the right knee Difficult exam due to pateint's pain, unable to complete assessment  Neurological: He is alert and oriented to person, place, and time.  Skin: Skin is warm and dry.  No open wounds, warm to touch  Psychiatric: He has a normal mood and affect. His behavior is normal.  Nursing note and vitals reviewed.  ED Treatments / Results  DIAGNOSTIC STUDIES: Oxygen Saturation is 99% on RA, normal by my interpretation.    COORDINATION OF CARE: 12:31 PM Discussed treatment plan with pt at bedside which includes Knee  imobilizer and follow up with Orthopedist and pt agreed to plan.  Labs (all labs ordered are listed, but only abnormal results are displayed) Labs Reviewed - No data to display  EKG  EKG Interpretation None      Radiology Dg Knee Complete 4 Views Right  Result Date: 02/23/2016 CLINICAL DATA:  Fall, lateral knee pain EXAM: RIGHT KNEE - COMPLETE 4+ VIEW COMPARISON:  02/06/2016 FINDINGS: 6 views of the right knee submitted. No acute fracture or subluxation. Narrowing of medial joint compartment. Significant narrowing of patellofemoral joint space. Spurring of patella. Large joint effusion. IMPRESSION: No acute fracture or subluxation. Osteoarthritic changes as described above. Large joint effusion. Electronically Signed   By: Lahoma Crocker M.D.   On: 02/23/2016 12:10    Procedures Procedures (including critical care time)  Medications Ordered in ED Medications - No data to display   Initial Impression / Assessment  and Plan / ED Course  I have reviewed the triage vital signs and the nursing notes.  Pertinent labs & imaging results that were available during my care of the patient were reviewed by me and considered in my medical decision making (see chart for details).  Clinical Course     50 year-old male presents today with knee pain. Patient likely has internal damage to the knee, he is acutely uncomfortable, did not perform specific tests due to patient's comfort level. He will need orthopedic follow-up for this. He is been seen for this before, highly encouraged follow-up with orthopedics. No signs of infectious process  I personally performed the services described in this documentation, which was scribed in my presence. The recorded information has been reviewed and is accurate.  Final Clinical Impressions(s) / ED Diagnoses   Final diagnoses:  Acute knee pain, unspecified laterality    New Prescriptions Discharge Medication List as of 02/23/2016 12:51 PM    START taking  these medications   Details  traMADol (ULTRAM) 50 MG tablet Take 1 tablet (50 mg total) by mouth every 6 (six) hours as needed., Starting Sun 02/23/2016, Print         Okey Regal, PA-C 02/23/16 [id number]    Dorie Rank, MD 02/24/16 1156

## 2016-02-23 NOTE — Discharge Instructions (Signed)
Please read attached information. If you experience any new or worsening signs or symptoms please return to the emergency room for evaluation. Please follow-up with your primary care provider or specialist as discussed. Please use medication prescribed only as directed and discontinue taking if you have any concerning signs or symptoms.   °

## 2016-02-23 NOTE — ED Notes (Signed)
Bed: WTR6 Expected date:  Expected time:  Means of arrival:  Comments: 50 yo fall; knee pain

## 2016-03-04 DIAGNOSIS — M1711 Unilateral primary osteoarthritis, right knee: Secondary | ICD-10-CM | POA: Diagnosis not present

## 2016-03-04 DIAGNOSIS — R7303 Prediabetes: Secondary | ICD-10-CM | POA: Insufficient documentation

## 2016-03-04 DIAGNOSIS — M25461 Effusion, right knee: Secondary | ICD-10-CM | POA: Diagnosis not present

## 2016-03-04 DIAGNOSIS — M25561 Pain in right knee: Secondary | ICD-10-CM | POA: Diagnosis not present

## 2016-03-11 DIAGNOSIS — I10 Essential (primary) hypertension: Secondary | ICD-10-CM | POA: Diagnosis not present

## 2016-03-11 DIAGNOSIS — R7303 Prediabetes: Secondary | ICD-10-CM | POA: Diagnosis not present

## 2016-03-11 DIAGNOSIS — M25561 Pain in right knee: Secondary | ICD-10-CM | POA: Diagnosis not present

## 2016-03-17 DIAGNOSIS — M25461 Effusion, right knee: Secondary | ICD-10-CM | POA: Diagnosis not present

## 2016-03-17 DIAGNOSIS — Z87898 Personal history of other specified conditions: Secondary | ICD-10-CM | POA: Diagnosis not present

## 2016-03-17 DIAGNOSIS — M1711 Unilateral primary osteoarthritis, right knee: Secondary | ICD-10-CM | POA: Diagnosis not present

## 2016-03-17 DIAGNOSIS — M25469 Effusion, unspecified knee: Secondary | ICD-10-CM | POA: Diagnosis not present

## 2016-03-17 DIAGNOSIS — Z8659 Personal history of other mental and behavioral disorders: Secondary | ICD-10-CM | POA: Diagnosis not present

## 2016-03-31 DIAGNOSIS — Z87898 Personal history of other specified conditions: Secondary | ICD-10-CM | POA: Diagnosis not present

## 2016-03-31 DIAGNOSIS — M25461 Effusion, right knee: Secondary | ICD-10-CM | POA: Diagnosis not present

## 2016-03-31 DIAGNOSIS — M1711 Unilateral primary osteoarthritis, right knee: Secondary | ICD-10-CM | POA: Diagnosis not present

## 2016-03-31 DIAGNOSIS — Z8659 Personal history of other mental and behavioral disorders: Secondary | ICD-10-CM | POA: Diagnosis not present

## 2016-04-10 ENCOUNTER — Ambulatory Visit (INDEPENDENT_AMBULATORY_CARE_PROVIDER_SITE_OTHER): Payer: Medicare Other

## 2016-04-10 ENCOUNTER — Ambulatory Visit (HOSPITAL_COMMUNITY)
Admission: RE | Admit: 2016-04-10 | Discharge: 2016-04-10 | Disposition: A | Discharge status: Home / Self Care | Payer: Medicare Other | Source: Ambulatory Visit | Attending: Urgent Care | Admitting: Urgent Care

## 2016-04-10 ENCOUNTER — Ambulatory Visit (INDEPENDENT_AMBULATORY_CARE_PROVIDER_SITE_OTHER): Payer: Medicare Other | Admitting: Urgent Care

## 2016-04-10 VITALS — BP 146/90 | HR 107 | Temp 98.4°F | Resp 20 | Ht 67.0 in | Wt 224.4 lb

## 2016-04-10 DIAGNOSIS — R1013 Epigastric pain: Secondary | ICD-10-CM | POA: Insufficient documentation

## 2016-04-10 DIAGNOSIS — G8929 Other chronic pain: Secondary | ICD-10-CM | POA: Diagnosis not present

## 2016-04-10 DIAGNOSIS — K5730 Diverticulosis of large intestine without perforation or abscess without bleeding: Secondary | ICD-10-CM | POA: Diagnosis not present

## 2016-04-10 DIAGNOSIS — K449 Diaphragmatic hernia without obstruction or gangrene: Secondary | ICD-10-CM | POA: Diagnosis not present

## 2016-04-10 DIAGNOSIS — M5136 Other intervertebral disc degeneration, lumbar region: Secondary | ICD-10-CM | POA: Insufficient documentation

## 2016-04-10 DIAGNOSIS — R1084 Generalized abdominal pain: Secondary | ICD-10-CM | POA: Diagnosis not present

## 2016-04-10 DIAGNOSIS — R42 Dizziness and giddiness: Secondary | ICD-10-CM | POA: Insufficient documentation

## 2016-04-10 DIAGNOSIS — R7303 Prediabetes: Secondary | ICD-10-CM | POA: Diagnosis not present

## 2016-04-10 DIAGNOSIS — R112 Nausea with vomiting, unspecified: Secondary | ICD-10-CM

## 2016-04-10 DIAGNOSIS — M479 Spondylosis, unspecified: Secondary | ICD-10-CM | POA: Diagnosis not present

## 2016-04-10 LAB — POCT CBC
GRANULOCYTE PERCENT: 82.2 % — AB (ref 37–80)
HEMATOCRIT: 39.2 % — AB (ref 43.5–53.7)
HEMOGLOBIN: 13.9 g/dL — AB (ref 14.1–18.1)
Lymph, poc: 1.3 (ref 0.6–3.4)
MCH, POC: 31.2 pg (ref 27–31.2)
MCHC: 35.5 g/dL — AB (ref 31.8–35.4)
MCV: 87.8 fL (ref 80–97)
MID (cbc): 0.5 (ref 0–0.9)
MPV: 7.3 fL (ref 0–99.8)
POC GRANULOCYTE: 8.2 — AB (ref 2–6.9)
POC LYMPH PERCENT: 12.9 %L (ref 10–50)
POC MID %: 4.9 %M (ref 0–12)
Platelet Count, POC: 268 10*3/uL (ref 142–424)
RBC: 4.46 M/uL — AB (ref 4.69–6.13)
RDW, POC: 13.2 %
WBC: 10 10*3/uL (ref 4.6–10.2)

## 2016-04-10 LAB — POCT URINALYSIS DIP (MANUAL ENTRY)
Bilirubin, UA: NEGATIVE
GLUCOSE UA: NEGATIVE
Ketones, POC UA: NEGATIVE
LEUKOCYTES UA: NEGATIVE
NITRITE UA: NEGATIVE
Protein Ur, POC: NEGATIVE
RBC UA: NEGATIVE
Spec Grav, UA: 1.01
UROBILINOGEN UA: 0.2
pH, UA: 6.5

## 2016-04-10 LAB — POCT I-STAT CREATININE: Creatinine, Ser: 0.7 mg/dL (ref 0.61–1.24)

## 2016-04-10 LAB — GLUCOSE, POCT (MANUAL RESULT ENTRY): POC Glucose: 110 mg/dl — AB (ref 70–99)

## 2016-04-10 LAB — POCT GLYCOSYLATED HEMOGLOBIN (HGB A1C): HEMOGLOBIN A1C: 5.8

## 2016-04-10 MED ORDER — ONDANSETRON HCL 4 MG/2ML IJ SOLN
4.0000 mg | Freq: Once | INTRAMUSCULAR | Status: AC
  Administered 2016-04-10: 4 mg via INTRAMUSCULAR

## 2016-04-10 MED ORDER — IOPAMIDOL (ISOVUE-300) INJECTION 61%
100.0000 mL | Freq: Once | INTRAVENOUS | Status: AC | PRN
  Administered 2016-04-10: 100 mL via INTRAVENOUS

## 2016-04-10 NOTE — Progress Notes (Signed)
MRN: [id number] DOB: 04/26/1965  Subjective:   [name] is a 51 y.o. male presenting for chief complaint of Abdominal Pain (x this morning) and Nausea (Emesis)  Abdominal pain - Report sudden onset of belly pain today. Patient started having epigastric pain, nausea with vomiting x4. Pain is sharp and stabbing, constant. Has associated mild dizziness, tingling of his face. Has had a 2 week history of productive cough. Denies fever, chest pain, shob, bloody stools, changes in stool habits, hematuria, dysuria, urinary frequency. Denies alcohol use. Smokes 1ppd. Has PCP, Dr. Leavy Cella with [facility]. Has a GI doctor, has had endoscopy done, found a polyp but nothing else requiring consistent f/u. Has a history of H. Pylori infection. Patient is a poor historian, does not know if he should be on diabetes medications. Patient uses albuterol inhaler for "lung problems or asthma" but denies history of COPD.  [place] has a current medication list which includes the following prescription(s): albuterol, folic acid, ibuprofen, pindolol, sucralfate, and valsartan. Also is allergic to aspirin.  [place]  has no past medical history on file. Also  has a past surgical history that includes Esophagogastroduodenoscopy (Left, 12/05/2013) and Esophagogastroduodenoscopy (egd) with propofol (N/A, 02/20/2015).  Objective:   Vitals: BP (!) 146/90 (BP Location: Right Arm, Patient Position: Sitting, Cuff Size: Large)   Pulse (!) 107   Temp 98.4 F (36.9 C) (Oral)   Resp 20   Ht 5\' 7"  (1.702 m)   Wt 224 lb 6.4 oz (101.8 kg)   SpO2 98%   BMI 35.15 kg/m   BP Readings from Last 3 Encounters:  04/10/16 (!) 146/90  02/23/16 118/89  02/06/16 125/95    Physical Exam  Constitutional: He is oriented to person, place, and time. He appears well-developed and well-nourished.  HENT:  Mouth/Throat: Oropharynx is clear and moist.  Eyes: No scleral icterus.  Neck: Normal range of motion. Neck supple.  Cardiovascular:  Normal rate, regular rhythm and intact distal pulses.  Exam reveals no gallop and no friction rub.   No murmur heard. Pulmonary/Chest: No respiratory distress. He has no wheezes. He has no rales.  Abdominal: Soft. Bowel sounds are normal. He exhibits no distension and no mass. There is tenderness (generalized, worse in epigastric region). There is no guarding.  Musculoskeletal: He exhibits no edema.  Neurological: He is alert and oriented to person, place, and time.  Skin: Skin is warm and dry.   Dg Abd 1 View  Result Date: 04/10/2016 CLINICAL DATA:  Generalized abdominal pain and epigastric pain. Nausea vomiting. EXAM: ABDOMEN - 1 VIEW COMPARISON:  None. FINDINGS: Normal bowel gas pattern. No evidence of renal or ureteral stones. Soft tissues are unremarkable. No acute skeletal abnormality. IMPRESSION: Negative. Electronically Signed   By: Amie Portland M.D.   On: 04/10/2016 17:01   Results for orders placed or performed in visit on 04/10/16 (from the past 24 hour(s))  POCT glucose (manual entry)     Status: Abnormal   Collection Time: 04/10/16  4:59 PM  Result Value Ref Range   POC Glucose 110 (A) 70 - 99 mg/dl  POCT CBC     Status: Abnormal   Collection Time: 04/10/16  4:59 PM  Result Value Ref Range   WBC 10.0 4.6 - 10.2 K/uL   Lymph, poc 1.3 0.6 - 3.4   POC LYMPH PERCENT 12.9 10 - 50 %L   MID (cbc) 0.5 0 - 0.9   POC MID % 4.9 0 - 12 %M   POC  Granulocyte 8.2 (A) 2 - 6.9   Granulocyte percent 82.2 (A) 37 - 80 %G   RBC 4.46 (A) 4.69 - 6.13 M/uL   Hemoglobin 13.9 (A) 14.1 - 18.1 g/dL   HCT, POC 29.5 (A) 62.1 - 53.7 %   MCV 87.8 80 - 97 fL   MCH, POC 31.2 27 - 31.2 pg   MCHC 35.5 (A) 31.8 - 35.4 g/dL   RDW, POC 30.8 %   Platelet Count, POC 268 142 - 424 K/uL   MPV 7.3 0 - 99.8 fL  POCT urinalysis dipstick     Status: None   Collection Time: 04/10/16  5:02 PM  Result Value Ref Range   Color, UA yellow yellow   Clarity, UA clear clear   Glucose, UA negative negative    Bilirubin, UA negative negative   Ketones, POC UA negative negative   Spec Grav, UA 1.010    Blood, UA negative negative   pH, UA 6.5    Protein Ur, POC negative negative   Urobilinogen, UA 0.2    Nitrite, UA Negative Negative   Leukocytes, UA Negative Negative  POCT glycosylated hemoglobin (Hb A1C)     Status: None   Collection Time: 04/10/16  5:03 PM  Result Value Ref Range   Hemoglobin A1C 5.8    Assessment and Plan :   1. Generalized abdominal pain 2. Nausea and vomiting, intractability of vomiting not specified, unspecified vomiting type 3. Abdominal pain, chronic, epigastric 4. Dizziness 5. Pre-diabetes - Will send for a STAT CT abdomen to r/o acute abdomen. Patient verbalized understanding.  Wallis Bamberg, PA-C [facility] at [facility] [phone] 04/10/2016  4:18 PM

## 2016-04-10 NOTE — Patient Instructions (Addendum)
Abdominal Pain, Adult Abdominal pain can be caused by many things. Often, abdominal pain is not serious and it gets better with no treatment or by being treated at home. However, sometimes abdominal pain is serious. Your health care provider will do a medical history and a physical exam to try to determine the cause of your abdominal pain. Follow these instructions at home:  Take over-the-counter and prescription medicines only as told by your health care provider. Do not take a laxative unless told by your health care provider.  Drink enough fluid to keep your urine clear or pale yellow.  Watch your condition for any changes.  Keep all follow-up visits as told by your health care provider. This is important. Contact a health care provider if:  Your abdominal pain changes or gets worse.  You are not hungry or you lose weight without trying.  You are constipated or have diarrhea for more than 2-3 days.  You have pain when you urinate or have a bowel movement.  Your abdominal pain wakes you up at night.  Your pain gets worse with meals, after eating, or with certain foods.  You are throwing up and cannot keep anything down.  You have a fever. Get help right away if:  Your pain does not go away as soon as your health care provider told you to expect.  You cannot stop throwing up.  Your pain is only in areas of the abdomen, such as the right side or the left lower portion of the abdomen.  You have bloody or black stools, or stools that look like tar.  You have severe pain, cramping, or bloating in your abdomen.  You have signs of dehydration, such as:  Dark urine, very little urine, or no urine.  Cracked lips.  Dry mouth.  Sunken eyes.  Sleepiness.  Weakness. This information is not intended to replace advice given to you by your health care provider. Make sure you discuss any questions you have with your health care provider. Document Released: 12/24/2004 Document  Revised: 10/04/2015 Document Reviewed: 08/28/2015 Elsevier Interactive Patient Education  2017 [employer].     IF you received an x-ray today, you will receive an invoice from [facility]. Please contact [facility] at [phone] with questions or concerns regarding your invoice.   IF you received labwork today, you will receive an invoice from [facility]. Please contact [facility] at [phone] with questions or concerns regarding your invoice.   Our billing staff will not be able to assist you with questions regarding bills from these companies.  You will be contacted with the lab results as soon as they are available. The fastest way to get your results is to activate your My Chart account. Instructions are located on the last page of this paperwork. If you have not heard from [place] regarding the results in 2 weeks, please contact this office.    Go directly to [facility] Radiology Department  Please register in the ER and they will come get you

## 2016-04-13 ENCOUNTER — Other Ambulatory Visit: Payer: Self-pay | Admitting: Urgent Care

## 2016-04-13 DIAGNOSIS — R1013 Epigastric pain: Secondary | ICD-10-CM | POA: Diagnosis not present

## 2016-04-13 DIAGNOSIS — G8929 Other chronic pain: Secondary | ICD-10-CM | POA: Diagnosis not present

## 2016-04-14 LAB — CMP14+EGFR
A/G RATIO: 1.5 (ref 1.2–2.2)
ALT: 6 IU/L (ref 0–44)
AST: 19 IU/L (ref 0–40)
Albumin: 4.2 g/dL (ref 3.5–5.5)
Alkaline Phosphatase: 129 IU/L — ABNORMAL HIGH (ref 39–117)
BUN/Creatinine Ratio: 6 — ABNORMAL LOW (ref 9–20)
BUN: 5 mg/dL — ABNORMAL LOW (ref 6–24)
CHLORIDE: 96 mmol/L (ref 96–106)
CO2: 21 mmol/L (ref 18–29)
Calcium: 9.1 mg/dL (ref 8.7–10.2)
Creatinine, Ser: 0.86 mg/dL (ref 0.76–1.27)
GFR calc non Af Amer: 101 mL/min/{1.73_m2} (ref >59)
GFR, EST AFRICAN AMERICAN: 117 mL/min/{1.73_m2} (ref >59)
Globulin, Total: 2.8 g/dL (ref 1.5–4.5)
Glucose: 107 mg/dL — ABNORMAL HIGH (ref 65–99)
Potassium: 4.6 mmol/L (ref 3.5–5.2)
Sodium: 132 mmol/L — ABNORMAL LOW (ref 134–144)
TOTAL PROTEIN: 7 g/dL (ref 6.0–8.5)

## 2016-05-11 ENCOUNTER — Ambulatory Visit (INDEPENDENT_AMBULATORY_CARE_PROVIDER_SITE_OTHER): Payer: Medicare Other | Admitting: Orthopaedic Surgery

## 2016-05-21 ENCOUNTER — Ambulatory Visit (INDEPENDENT_AMBULATORY_CARE_PROVIDER_SITE_OTHER): Payer: Medicare Other | Admitting: Orthopedic Surgery

## 2016-05-27 ENCOUNTER — Encounter (INDEPENDENT_AMBULATORY_CARE_PROVIDER_SITE_OTHER): Payer: Self-pay | Admitting: Orthopedic Surgery

## 2016-05-27 ENCOUNTER — Encounter (INDEPENDENT_AMBULATORY_CARE_PROVIDER_SITE_OTHER): Payer: Self-pay

## 2016-05-27 ENCOUNTER — Ambulatory Visit (INDEPENDENT_AMBULATORY_CARE_PROVIDER_SITE_OTHER): Payer: Medicare Other | Admitting: Orthopedic Surgery

## 2016-05-27 DIAGNOSIS — M1711 Unilateral primary osteoarthritis, right knee: Secondary | ICD-10-CM | POA: Diagnosis not present

## 2016-05-27 NOTE — Progress Notes (Signed)
Office Visit Note   Patient: [name]           Date of Birth: [birth year]           MRN: [id number] Visit Date: 05/27/2016 Requested by: Tammy Eartha Inch, MD [address] [facility] [place], [place] [address] PCP: [name], MD  Subjective: Chief Complaint  Patient presents with  . Right Knee - Pain    HPI [name] is a 51 year old patient with right knee pain.  Radiographs from canopy demonstrate fairly significant medial compartment arthritis from November 2017.  He is having a lot of pain.  He's been told he needs total knee replacement.  He's tried nonsteroidals without relief.  He seen by his primary care physician in January and was given a clean bill of health.  He can only walk 100 feet before he has to stop.  He's had cortisone injection 2 months ago in the emergency room.  He is a smoker but does not have diabetes.  This pain will awaken him from sleep.  Drinks alcohol one time every 3 weeks.  He is disabled from "mental and physical things".  He did see a dentist in November where a partial plate was made.  He denies any broken teeth currently.  He has 2 steps at home.  He is here today with a Child psychotherapist.              Review of Systems All systems reviewed are negative as they relate to the chief complaint within the history of present illness.  Patient denies  fevers or chills.    Assessment & Plan: Visit Diagnoses:  1. Primary osteoarthritis of right knee     Plan: Impression is endstage right knee arthritis.  Plan is right total knee replacement.  The patient has pretty significant arthritis and effusion.  His collaterals and cruciate ligaments are stable.  He has bone-on-bone arthritis in the medial compartment but describes global pain in the knee.  He may be at reasonable candidate for press-fit total knee replacement depending on his bone quality.  Would like to preserve the PCL for bone sparing if possible.  He does not have a  flexion contracture.  Risk and benefits of knee replacement discussed including not limited to infection or vessel damage knee stiffness potential need for revision in his lifetime.  Patient understands the risks and benefits.  His smoking does limit him in a risk category for wound healing and this is described to him.  Patient understands.  All questions answered.  Follow-Up Instructions: No Follow-up on file.   Orders:  No orders of the defined types were placed in this encounter.  No orders of the defined types were placed in this encounter.     Procedures: No procedures performed   Clinical Data: No additional findings.  Objective: Vital Signs: There were no vitals taken for this visit.  Physical Exam   Constitutional: Patient appears well-developed HEENT:  Head: Normocephalic Eyes:EOM are normal Neck: Normal range of motion Cardiovascular: Normal rate Pulmonary/chest: Effort normal Neurologic: Patient is alert Skin: Skin is warm Psychiatric: Patient has normal mood and affect    Ortho Exam orthopedic exam demonstrates moderate effusion in the right knee.  Without warmth.  He has pretty reasonable range of motion from full extension to about 115 of flexion.  Collateral cruciate ligaments are stable.  Has medial and lateral joint line tenderness.  Extensor mechanism is intact.  Pedal pulses palpable.  No groin pain with internal/external rotation of the leg and no nerve retention signs.  No other masses lymph adenopathy or skin changes noted in the right knee region  Specialty Comments:  No specialty comments available.  Imaging: No results found.   PMFS History: Patient Active Problem List   Diagnosis Date Noted  . Primary osteoarthritis of right knee 05/27/2016  . Upper GI bleed 02/18/2015  . Benign essential HTN 02/18/2015  . Anemia of chronic disease 02/18/2015  . Acute alcohol intoxication ([facility]) 02/18/2015  . Pulmonary nodule 02/18/2015  . Metabolic  acidosis 02/18/2015  . Alcohol dependence with intoxication with complication ([facility])   . Major depressive disorder, recurrent episode, moderate ([facility])   . Cocaine abuse 03/02/2014  . History of schizophrenia 03/01/2014  . Tobacco abuse 12/06/2013  . Hematemesis 12/04/2013   No past medical history on file.  No family history on file.  Past Surgical History:  Procedure Laterality Date  . ESOPHAGOGASTRODUODENOSCOPY Left 12/05/2013   Procedure: ESOPHAGOGASTRODUODENOSCOPY (EGD);  Surgeon: Willis Modena, MD;  Location: [facility];  Service: Endoscopy;  Laterality: Left;  . ESOPHAGOGASTRODUODENOSCOPY (EGD) WITH PROPOFOL N/A 02/20/2015   Procedure: ESOPHAGOGASTRODUODENOSCOPY (EGD) WITH PROPOFOL;  Surgeon: Charlott Rakes, MD;  Location: [facility];  Service: Endoscopy;  Laterality: N/A;   Social History   Occupational History  . Not on file.   Social History Main Topics  . Smoking status: Heavy Tobacco Smoker    Packs/day: 1.50    Years: 13.00    Types: Cigarettes  . Smokeless tobacco: Never Used  . Alcohol use 0.0 oz/week     Comment: pt states he isn't sure but drank at least 7-8 40s tonight  . Drug use: No  . Sexual activity: Yes

## 2016-05-29 ENCOUNTER — Telehealth (INDEPENDENT_AMBULATORY_CARE_PROVIDER_SITE_OTHER): Payer: Self-pay | Admitting: Orthopedic Surgery

## 2016-05-29 NOTE — Telephone Encounter (Signed)
LVM with pt to call to schedule surgery. Will try again at a later time.

## 2016-06-01 ENCOUNTER — Other Ambulatory Visit (INDEPENDENT_AMBULATORY_CARE_PROVIDER_SITE_OTHER): Payer: Self-pay | Admitting: Orthopedic Surgery

## 2016-06-01 DIAGNOSIS — M1711 Unilateral primary osteoarthritis, right knee: Secondary | ICD-10-CM

## 2016-07-10 ENCOUNTER — Encounter (HOSPITAL_COMMUNITY)
Admission: RE | Admit: 2016-07-10 | Discharge: 2016-07-10 | Disposition: A | Discharge status: Home / Self Care | Payer: Medicare Other | Source: Ambulatory Visit | Attending: Orthopedic Surgery | Admitting: Orthopedic Surgery

## 2016-07-10 ENCOUNTER — Encounter (HOSPITAL_COMMUNITY): Payer: Self-pay

## 2016-07-10 DIAGNOSIS — J45909 Unspecified asthma, uncomplicated: Secondary | ICD-10-CM | POA: Diagnosis not present

## 2016-07-10 DIAGNOSIS — Z419 Encounter for procedure for purposes other than remedying health state, unspecified: Secondary | ICD-10-CM

## 2016-07-10 DIAGNOSIS — Z01810 Encounter for preprocedural cardiovascular examination: Secondary | ICD-10-CM | POA: Diagnosis not present

## 2016-07-10 DIAGNOSIS — Z01812 Encounter for preprocedural laboratory examination: Secondary | ICD-10-CM | POA: Diagnosis not present

## 2016-07-10 DIAGNOSIS — Z01818 Encounter for other preprocedural examination: Secondary | ICD-10-CM | POA: Diagnosis not present

## 2016-07-10 DIAGNOSIS — M1711 Unilateral primary osteoarthritis, right knee: Secondary | ICD-10-CM | POA: Insufficient documentation

## 2016-07-10 HISTORY — DX: Anxiety disorder, unspecified: F41.9

## 2016-07-10 HISTORY — DX: Unspecified osteoarthritis, unspecified site: M19.90

## 2016-07-10 HISTORY — DX: Unspecified asthma, uncomplicated: J45.909

## 2016-07-10 HISTORY — DX: Pneumonia, unspecified organism: J18.9

## 2016-07-10 HISTORY — DX: Gastro-esophageal reflux disease without esophagitis: K21.9

## 2016-07-10 LAB — COMPREHENSIVE METABOLIC PANEL
ALK PHOS: 109 U/L (ref 38–126)
ALT: 9 U/L — ABNORMAL LOW (ref 17–63)
AST: 21 U/L (ref 15–41)
Albumin: 3.9 g/dL (ref 3.5–5.0)
Anion gap: 10 (ref 5–15)
BILIRUBIN TOTAL: 0.4 mg/dL (ref 0.3–1.2)
BUN: 7 mg/dL (ref 6–20)
CALCIUM: 9 mg/dL (ref 8.9–10.3)
CO2: 19 mmol/L — ABNORMAL LOW (ref 22–32)
Chloride: 105 mmol/L (ref 101–111)
Creatinine, Ser: 1.01 mg/dL (ref 0.61–1.24)
GLUCOSE: 114 mg/dL — AB (ref 65–99)
Potassium: 4.2 mmol/L (ref 3.5–5.1)
Sodium: 134 mmol/L — ABNORMAL LOW (ref 135–145)
TOTAL PROTEIN: 6.6 g/dL (ref 6.5–8.1)

## 2016-07-10 LAB — CBC
HCT: 38.9 % — ABNORMAL LOW (ref 39.0–52.0)
Hemoglobin: 13.1 g/dL (ref 13.0–17.0)
MCH: 29.6 pg (ref 26.0–34.0)
MCHC: 33.7 g/dL (ref 30.0–36.0)
MCV: 88 fL (ref 78.0–100.0)
PLATELETS: 318 10*3/uL (ref 150–400)
RBC: 4.42 MIL/uL (ref 4.22–5.81)
RDW: 14.7 % (ref 11.5–15.5)
WBC: 10.1 10*3/uL (ref 4.0–10.5)

## 2016-07-10 LAB — URINALYSIS, ROUTINE W REFLEX MICROSCOPIC
Bilirubin Urine: NEGATIVE
GLUCOSE, UA: NEGATIVE mg/dL
Hgb urine dipstick: NEGATIVE
Ketones, ur: NEGATIVE mg/dL
Nitrite: NEGATIVE
PROTEIN: NEGATIVE mg/dL
SQUAMOUS EPITHELIAL / LPF: NONE SEEN
Specific Gravity, Urine: 1.001 — ABNORMAL LOW (ref 1.005–1.030)
pH: 7 (ref 5.0–8.0)

## 2016-07-10 LAB — SURGICAL PCR SCREEN
MRSA, PCR: NEGATIVE
STAPHYLOCOCCUS AUREUS: NEGATIVE

## 2016-07-10 NOTE — Pre-Procedure Instructions (Addendum)
[name]  07/10/2016      [facility] [place], [place] - [address] AT [address] [place] [address] Phone: [phone] Fax: [phone]    Your procedure is scheduled on Tuesday, April 24.  Report to [facility] Admitting at 10:30 AM                 Your surgery or procedure is scheduled for 12:30 AM   Call this number if you have problems the morning of surgery: [phone]                 For any other questions, please call [phone], Monday - Friday 8 AM - 4 PM.   Remember:  Do not eat food or drink liquids after midnight Monday, April 23.  Take these medicines the morning of surgery with A SIP OF WATER: amLODipine (NORVASC), buPROPion (WELLBUTRIN XL), pantoprazole (PROTONIX), pindolol (VISKEN).  May use Albuterol Inhaler if needed and bring it to hospital with you.  Do Not take Metformin the Morning of Surgery.                Hold benztropine (COGENTIN) as instructed.                  1 Week prior to surgery STOP taking  Aspirin Products (Goody Powder, Excedrin Migraine), Ibuprofen (Advil), Naproxen (Aleve), Vitamins and Herbal Products (ie Fish Oil)   How to Manage Your Diabetes Before and After Surgery  Why is it important to control my blood sugar before and after surgery? . Improving blood sugar levels before and after surgery helps healing and can limit problems. . A way of improving blood sugar control is eating a healthy diet by: o  Eating less sugar and carbohydrates o  Increasing activity/exercise o  Talking with your doctor about reaching your blood sugar goals . High blood sugars (greater than 180 mg/dL) can raise your risk of infections and slow your recovery, so you will need to focus on controlling your diabetes during the weeks before surgery. . Make sure that the doctor who takes care of your diabetes knows about your planned surgery including the date and  location.  How do I manage my blood sugar before surgery? . Check your blood sugar at least 4 times a day, starting 2 days before surgery, to make sure that the level is not too high or low. o Check your blood sugar the morning of your surgery when you wake up and every 2 hours until you get to the Short Stay unit. . If your blood sugar is less than 70 mg/dL, you will need to treat for low blood sugar: o Do not take insulin. o Treat a low blood sugar (less than 70 mg/dL) with  cup of clear juice (cranberry or apple), 4 glucose tablets, OR glucose gel. o Recheck blood sugar in 15 minutes after treatment (to make sure it is greater than 70 mg/dL). If your blood sugar is not greater than 70 mg/dL on recheck, call [phone] for further instructions. . Report your blood sugar to the short stay nurse when you get to Short Stay.  . If you are admitted to the hospital after surgery: o Your blood sugar will be checked by the staff and you will probably be given insulin after surgery (instead of oral diabetes medicines) to make sure you have good blood sugar levels. o The  goal for blood sugar control after surgery is 80-180 mg/  WHAT DO I DO ABOUT MY DIABETES MEDICATION?   Marland Kitchen Do not take oral diabetes medicines (pills) the morning of surgery.(metformin)  Special instructions:   [place]- Preparing For Surgery  Before surgery, you can play an important role. Because skin is not sterile, your skin needs to be as free of germs as possible. You can reduce the number of germs on your skin by washing with CHG (chlorahexidine gluconate) Soap before surgery.  CHG is an antiseptic cleaner which kills germs and bonds with the skin to continue killing germs even after washing.  Please do not use if you have an allergy to CHG or antibacterial soaps. If your skin becomes reddened/irritated stop using the CHG.  Do not shave (including legs and underarms) for at least 48 hours prior to first CHG shower. It is  OK to shave your face.  Please follow these instructions carefully.   1. Shower the NIGHT BEFORE SURGERY and the MORNING OF SURGERY with CHG.   2. If you chose to wash your hair, wash your hair first as usual with your normal shampoo.  3. After you shampoo, rinse your hair and body thoroughly to remove the shampoo.  4. Use CHG as you would any other liquid soap. You can apply CHG directly to the skin and wash gently with a scrungie or a clean washcloth.   5. Apply the CHG Soap to your body ONLY FROM THE NECK DOWN.  Do not use on open wounds or open sores. Avoid contact with your eyes, ears, mouth and genitals (private parts). Wash genitals (private parts) with your normal soap.  6. Wash thoroughly, paying special attention to the area where your surgery will be performed.  7. Thoroughly rinse your body with warm water from the neck down.  8. DO NOT shower/wash with your normal soap after using and rinsing off the CHG Soap.  9. Pat yourself dry with a CLEAN TOWEL.   10. Wear CLEAN PAJAMAS   11. Place CLEAN SHEETS on your bed the night of your first shower and DO NOT SLEEP WITH PETS  Day of Surgery: Do not apply any deodorants/lotions,powders, or perfumes . Please wear clean clothes to the hospital/surgery center.    Do not wear jewelry, make-up or nail polish.  Do not wear lotions, powders, or perfumes, or deoderant.  Do not shave 48 hours prior to surgery.  Men may shave face and neck.  Do not bring valuables to the hospital.  [facility] is not responsible for any belongings or valuables.  Contacts, dentures or bridgework may not be worn into surgery.  Leave your suitcase in the car.  After surgery it may be brought to your room.  For patients admitted to the hospital, discharge time will be determined by your treatment team.  Patients discharged the day of surgery will not be allowed to drive home.   Please read over the fact sheets that you were given:

## 2016-07-11 LAB — HEMOGLOBIN A1C
HEMOGLOBIN A1C: 6 % — AB (ref 4.8–5.6)
MEAN PLASMA GLUCOSE: 126 mg/dL

## 2016-07-12 LAB — URINE CULTURE: Culture: 70000 — AB

## 2016-07-13 ENCOUNTER — Other Ambulatory Visit (INDEPENDENT_AMBULATORY_CARE_PROVIDER_SITE_OTHER): Payer: Self-pay | Admitting: Orthopedic Surgery

## 2016-07-13 ENCOUNTER — Other Ambulatory Visit (HOSPITAL_COMMUNITY): Payer: Self-pay

## 2016-07-20 MED ORDER — CEFAZOLIN SODIUM-DEXTROSE 2-4 GM/100ML-% IV SOLN
2.0000 g | INTRAVENOUS | Status: AC
  Administered 2016-07-21: 2 g via INTRAVENOUS
  Filled 2016-07-20: qty 100

## 2016-07-21 ENCOUNTER — Encounter (HOSPITAL_COMMUNITY)
Admission: RE | Disposition: A | Discharge status: Skilled Nursing Facility | Payer: Self-pay | Source: Ambulatory Visit | Attending: Orthopedic Surgery

## 2016-07-21 ENCOUNTER — Inpatient Hospital Stay (HOSPITAL_COMMUNITY)
Admission: RE | Admit: 2016-07-21 | Discharge: 2016-07-24 | DRG: 470 | Disposition: A | Discharge status: Skilled Nursing Facility | Payer: Medicare Other | Source: Ambulatory Visit | Attending: Orthopedic Surgery | Admitting: Orthopedic Surgery

## 2016-07-21 ENCOUNTER — Encounter (HOSPITAL_COMMUNITY): Payer: Self-pay | Admitting: *Deleted

## 2016-07-21 ENCOUNTER — Inpatient Hospital Stay (HOSPITAL_COMMUNITY): Payer: Medicare Other | Admitting: Anesthesiology

## 2016-07-21 DIAGNOSIS — Z79899 Other long term (current) drug therapy: Secondary | ICD-10-CM | POA: Diagnosis not present

## 2016-07-21 DIAGNOSIS — M659 Synovitis and tenosynovitis, unspecified: Secondary | ICD-10-CM | POA: Diagnosis not present

## 2016-07-21 DIAGNOSIS — E119 Type 2 diabetes mellitus without complications: Secondary | ICD-10-CM | POA: Diagnosis present

## 2016-07-21 DIAGNOSIS — Z7901 Long term (current) use of anticoagulants: Secondary | ICD-10-CM | POA: Diagnosis not present

## 2016-07-21 DIAGNOSIS — K922 Gastrointestinal hemorrhage, unspecified: Secondary | ICD-10-CM | POA: Diagnosis not present

## 2016-07-21 DIAGNOSIS — Z7984 Long term (current) use of oral hypoglycemic drugs: Secondary | ICD-10-CM | POA: Diagnosis not present

## 2016-07-21 DIAGNOSIS — F17210 Nicotine dependence, cigarettes, uncomplicated: Secondary | ICD-10-CM | POA: Diagnosis not present

## 2016-07-21 DIAGNOSIS — I10 Essential (primary) hypertension: Secondary | ICD-10-CM | POA: Diagnosis not present

## 2016-07-21 DIAGNOSIS — G8918 Other acute postprocedural pain: Secondary | ICD-10-CM | POA: Diagnosis not present

## 2016-07-21 DIAGNOSIS — K219 Gastro-esophageal reflux disease without esophagitis: Secondary | ICD-10-CM | POA: Diagnosis present

## 2016-07-21 DIAGNOSIS — Z8673 Personal history of transient ischemic attack (TIA), and cerebral infarction without residual deficits: Secondary | ICD-10-CM | POA: Diagnosis not present

## 2016-07-21 DIAGNOSIS — M1990 Unspecified osteoarthritis, unspecified site: Secondary | ICD-10-CM | POA: Diagnosis not present

## 2016-07-21 DIAGNOSIS — M1711 Unilateral primary osteoarthritis, right knee: Secondary | ICD-10-CM | POA: Diagnosis not present

## 2016-07-21 DIAGNOSIS — R2689 Other abnormalities of gait and mobility: Secondary | ICD-10-CM | POA: Diagnosis not present

## 2016-07-21 DIAGNOSIS — Z886 Allergy status to analgesic agent status: Secondary | ICD-10-CM

## 2016-07-21 DIAGNOSIS — M6281 Muscle weakness (generalized): Secondary | ICD-10-CM | POA: Diagnosis not present

## 2016-07-21 DIAGNOSIS — Z471 Aftercare following joint replacement surgery: Secondary | ICD-10-CM | POA: Diagnosis not present

## 2016-07-21 DIAGNOSIS — Z96651 Presence of right artificial knee joint: Secondary | ICD-10-CM | POA: Diagnosis not present

## 2016-07-21 DIAGNOSIS — M1710 Unilateral primary osteoarthritis, unspecified knee: Secondary | ICD-10-CM | POA: Diagnosis present

## 2016-07-21 DIAGNOSIS — R7303 Prediabetes: Secondary | ICD-10-CM | POA: Diagnosis not present

## 2016-07-21 DIAGNOSIS — D638 Anemia in other chronic diseases classified elsewhere: Secondary | ICD-10-CM | POA: Diagnosis not present

## 2016-07-21 DIAGNOSIS — S8990XA Unspecified injury of unspecified lower leg, initial encounter: Secondary | ICD-10-CM | POA: Diagnosis not present

## 2016-07-21 HISTORY — PX: TOTAL KNEE ARTHROPLASTY: SHX125

## 2016-07-21 HISTORY — DX: Schizophrenia, unspecified: F20.9

## 2016-07-21 HISTORY — DX: Prediabetes: R73.03

## 2016-07-21 LAB — GLUCOSE, CAPILLARY
GLUCOSE-CAPILLARY: 149 mg/dL — AB (ref 65–99)
Glucose-Capillary: 112 mg/dL — ABNORMAL HIGH (ref 65–99)

## 2016-07-21 SURGERY — ARTHROPLASTY, KNEE, TOTAL
Anesthesia: Spinal | Site: Knee | Laterality: Right

## 2016-07-21 MED ORDER — METOCLOPRAMIDE HCL 5 MG PO TABS: 5.0000 mg | ORAL_TABLET | Freq: Three times a day (TID) | ORAL | Status: DC | PRN

## 2016-07-21 MED ORDER — METOCLOPRAMIDE HCL 5 MG/ML IJ SOLN: 5.0000 mg | Freq: Three times a day (TID) | INTRAMUSCULAR | Status: DC | PRN

## 2016-07-21 MED ORDER — EPINEPHRINE PF 1 MG/ML IJ SOLN
INTRAMUSCULAR | Status: AC
  Filled 2016-07-21: qty 1

## 2016-07-21 MED ORDER — 0.9 % SODIUM CHLORIDE (POUR BTL) OPTIME
TOPICAL | Status: DC | PRN
  Administered 2016-07-21 (×2): 1000 mL

## 2016-07-21 MED ORDER — KETOROLAC TROMETHAMINE 30 MG/ML IJ SOLN
30.0000 mg | Freq: Four times a day (QID) | INTRAMUSCULAR | Status: AC
  Administered 2016-07-21 – 2016-07-22 (×4): 30 mg via INTRAVENOUS
  Filled 2016-07-21 (×5): qty 1

## 2016-07-21 MED ORDER — SUCCINYLCHOLINE CHLORIDE 200 MG/10ML IV SOSY
PREFILLED_SYRINGE | INTRAVENOUS | Status: AC
  Filled 2016-07-21: qty 10

## 2016-07-21 MED ORDER — MORPHINE SULFATE (PF) 4 MG/ML IV SOLN
INTRAVENOUS | Status: AC
  Filled 2016-07-21: qty 1

## 2016-07-21 MED ORDER — ONDANSETRON HCL 4 MG/2ML IJ SOLN: 4.0000 mg | Freq: Four times a day (QID) | INTRAMUSCULAR | Status: DC | PRN

## 2016-07-21 MED ORDER — METHOCARBAMOL 500 MG PO TABS
500.0000 mg | ORAL_TABLET | Freq: Four times a day (QID) | ORAL | Status: DC | PRN
  Administered 2016-07-21 – 2016-07-24 (×10): 500 mg via ORAL
  Filled 2016-07-21 (×11): qty 1

## 2016-07-21 MED ORDER — PANTOPRAZOLE SODIUM 40 MG PO TBEC
40.0000 mg | DELAYED_RELEASE_TABLET | Freq: Two times a day (BID) | ORAL | Status: DC
  Administered 2016-07-21 – 2016-07-24 (×6): 40 mg via ORAL
  Filled 2016-07-21 (×5): qty 1

## 2016-07-21 MED ORDER — ACETAMINOPHEN 325 MG PO TABS
650.0000 mg | ORAL_TABLET | Freq: Four times a day (QID) | ORAL | Status: DC | PRN
  Administered 2016-07-23 – 2016-07-24 (×3): 650 mg via ORAL
  Filled 2016-07-21 (×3): qty 2

## 2016-07-21 MED ORDER — PHENYLEPHRINE 40 MCG/ML (10ML) SYRINGE FOR IV PUSH (FOR BLOOD PRESSURE SUPPORT)
PREFILLED_SYRINGE | INTRAVENOUS | Status: AC
  Filled 2016-07-21: qty 10

## 2016-07-21 MED ORDER — ALBUTEROL SULFATE HFA 108 (90 BASE) MCG/ACT IN AERS: 1.0000 | INHALATION_SPRAY | Freq: Two times a day (BID) | RESPIRATORY_TRACT | Status: DC | PRN

## 2016-07-21 MED ORDER — FOLIC ACID 1 MG PO TABS
1.0000 mg | ORAL_TABLET | Freq: Every day | ORAL | Status: DC
  Administered 2016-07-22 – 2016-07-24 (×3): 1 mg via ORAL
  Filled 2016-07-21 (×3): qty 1

## 2016-07-21 MED ORDER — GABAPENTIN 300 MG PO CAPS: 300.0000 mg | ORAL_CAPSULE | Freq: Three times a day (TID) | ORAL | Status: DC

## 2016-07-21 MED ORDER — BUPIVACAINE HCL 0.25 % IJ SOLN
INTRAMUSCULAR | Status: DC | PRN
  Administered 2016-07-21: 10 mL

## 2016-07-21 MED ORDER — MORPHINE SULFATE (PF) 4 MG/ML IV SOLN
4.0000 mg | INTRAVENOUS | Status: DC | PRN
  Administered 2016-07-21 – 2016-07-24 (×15): 4 mg via INTRAVENOUS
  Filled 2016-07-21 (×15): qty 1

## 2016-07-21 MED ORDER — PHENYLEPHRINE HCL 10 MG/ML IJ SOLN
INTRAVENOUS | Status: DC | PRN
  Administered 2016-07-21: 25 ug/min via INTRAVENOUS

## 2016-07-21 MED ORDER — PROPOFOL 1000 MG/100ML IV EMUL
INTRAVENOUS | Status: AC
  Filled 2016-07-21: qty 100

## 2016-07-21 MED ORDER — MIDAZOLAM HCL 2 MG/2ML IJ SOLN
INTRAMUSCULAR | Status: AC
  Filled 2016-07-21: qty 2

## 2016-07-21 MED ORDER — PROPOFOL 10 MG/ML IV BOLUS
INTRAVENOUS | Status: AC
  Filled 2016-07-21: qty 20

## 2016-07-21 MED ORDER — METHOCARBAMOL 1000 MG/10ML IJ SOLN
500.0000 mg | Freq: Four times a day (QID) | INTRAVENOUS | Status: DC | PRN
  Filled 2016-07-21: qty 5

## 2016-07-21 MED ORDER — SODIUM CHLORIDE 0.9 % IR SOLN
Status: DC | PRN
  Administered 2016-07-21: 3000 mL

## 2016-07-21 MED ORDER — BUPROPION HCL ER (XL) 150 MG PO TB24
150.0000 mg | ORAL_TABLET | Freq: Every day | ORAL | Status: DC
  Administered 2016-07-22 – 2016-07-24 (×3): 150 mg via ORAL
  Filled 2016-07-21 (×3): qty 1

## 2016-07-21 MED ORDER — CHLORHEXIDINE GLUCONATE 4 % EX LIQD
60.0000 mL | Freq: Once | CUTANEOUS | Status: DC
Start: 2016-07-21 — End: 2016-07-21

## 2016-07-21 MED ORDER — HYDROMORPHONE HCL 1 MG/ML IJ SOLN: 0.2500 mg | INTRAMUSCULAR | Status: DC | PRN

## 2016-07-21 MED ORDER — MENTHOL 3 MG MT LOZG: 1.0000 | LOZENGE | OROMUCOSAL | Status: DC | PRN

## 2016-07-21 MED ORDER — PHENYLEPHRINE 40 MCG/ML (10ML) SYRINGE FOR IV PUSH (FOR BLOOD PRESSURE SUPPORT)
PREFILLED_SYRINGE | INTRAVENOUS | Status: DC | PRN
  Administered 2016-07-21: 80 ug via INTRAVENOUS

## 2016-07-21 MED ORDER — ALBUTEROL SULFATE (2.5 MG/3ML) 0.083% IN NEBU: 2.5000 mg | INHALATION_SOLUTION | Freq: Two times a day (BID) | RESPIRATORY_TRACT | Status: DC | PRN

## 2016-07-21 MED ORDER — CEFAZOLIN SODIUM 1 G IJ SOLR
INTRAMUSCULAR | Status: AC
  Filled 2016-07-21: qty 20

## 2016-07-21 MED ORDER — FENTANYL CITRATE (PF) 100 MCG/2ML IJ SOLN
50.0000 ug | Freq: Once | INTRAMUSCULAR | Status: AC
Start: 2016-07-21 — End: 2016-07-21
  Administered 2016-07-21: 50 ug via INTRAVENOUS

## 2016-07-21 MED ORDER — ONDANSETRON HCL 4 MG PO TABS: 4.0000 mg | ORAL_TABLET | Freq: Four times a day (QID) | ORAL | Status: DC | PRN

## 2016-07-21 MED ORDER — DOCUSATE SODIUM 100 MG PO CAPS
100.0000 mg | ORAL_CAPSULE | Freq: Two times a day (BID) | ORAL | Status: DC
Start: 2016-07-21 — End: 2016-07-24
  Administered 2016-07-21 – 2016-07-24 (×6): 100 mg via ORAL
  Filled 2016-07-21 (×6): qty 1

## 2016-07-21 MED ORDER — LACTATED RINGERS IV SOLN
INTRAVENOUS | Status: DC
  Administered 2016-07-21 (×2): via INTRAVENOUS

## 2016-07-21 MED ORDER — OXYCODONE HCL 5 MG PO TABS
5.0000 mg | ORAL_TABLET | ORAL | Status: DC | PRN
  Administered 2016-07-21 – 2016-07-24 (×20): 10 mg via ORAL
  Filled 2016-07-21 (×20): qty 2

## 2016-07-21 MED ORDER — AMLODIPINE BESYLATE 2.5 MG PO TABS
2.5000 mg | ORAL_TABLET | Freq: Every day | ORAL | Status: DC
  Administered 2016-07-22 – 2016-07-24 (×3): 2.5 mg via ORAL
  Filled 2016-07-21 (×3): qty 1

## 2016-07-21 MED ORDER — MORPHINE SULFATE (PF) 4 MG/ML IV SOLN
INTRAVENOUS | Status: DC | PRN
  Administered 2016-07-21: 8 mg via INTRAVENOUS

## 2016-07-21 MED ORDER — FENTANYL CITRATE (PF) 100 MCG/2ML IJ SOLN
INTRAMUSCULAR | Status: AC
  Administered 2016-07-21: 50 ug via INTRAVENOUS
  Filled 2016-07-21: qty 2

## 2016-07-21 MED ORDER — ROPIVACAINE HCL 5 MG/ML IJ SOLN
INTRAMUSCULAR | Status: DC | PRN
  Administered 2016-07-21: 30 mL via PERINEURAL

## 2016-07-21 MED ORDER — BUPIVACAINE LIPOSOME 1.3 % IJ SUSP
20.0000 mL | INTRAMUSCULAR | Status: AC
  Administered 2016-07-21: 20 mL
  Filled 2016-07-21: qty 20

## 2016-07-21 MED ORDER — PROPOFOL 500 MG/50ML IV EMUL
INTRAVENOUS | Status: DC | PRN
  Administered 2016-07-21: 75 ug/kg/min via INTRAVENOUS
  Administered 2016-07-21: 15:00:00 via INTRAVENOUS

## 2016-07-21 MED ORDER — SODIUM CHLORIDE 0.9 % IV SOLN
2000.0000 mg | INTRAVENOUS | Status: AC
  Administered 2016-07-21: 2000 mg via TOPICAL
  Filled 2016-07-21: qty 20

## 2016-07-21 MED ORDER — ROCURONIUM BROMIDE 10 MG/ML (PF) SYRINGE
PREFILLED_SYRINGE | INTRAVENOUS | Status: AC
  Filled 2016-07-21: qty 5

## 2016-07-21 MED ORDER — CEFAZOLIN SODIUM-DEXTROSE 2-4 GM/100ML-% IV SOLN
2.0000 g | Freq: Four times a day (QID) | INTRAVENOUS | Status: AC
  Administered 2016-07-21 (×2): 2 g via INTRAVENOUS
  Filled 2016-07-21 (×2): qty 100

## 2016-07-21 MED ORDER — BUPIVACAINE HCL (PF) 0.25 % IJ SOLN
INTRAMUSCULAR | Status: AC
  Filled 2016-07-21: qty 30

## 2016-07-21 MED ORDER — TRANEXAMIC ACID 1000 MG/10ML IV SOLN
1000.0000 mg | INTRAVENOUS | Status: AC
  Administered 2016-07-21: 1000 mg via INTRAVENOUS
  Filled 2016-07-21: qty 10

## 2016-07-21 MED ORDER — BUPIVACAINE HCL (PF) 0.5 % IJ SOLN
INTRAMUSCULAR | Status: AC
  Filled 2016-07-21: qty 30

## 2016-07-21 MED ORDER — IRBESARTAN 75 MG PO TABS
37.5000 mg | ORAL_TABLET | Freq: Every day | ORAL | Status: DC
  Administered 2016-07-22 – 2016-07-24 (×3): 37.5 mg via ORAL
  Filled 2016-07-21 (×3): qty 0.5

## 2016-07-21 MED ORDER — METFORMIN HCL ER 500 MG PO TB24
500.0000 mg | ORAL_TABLET | Freq: Every day | ORAL | Status: DC
  Administered 2016-07-22 – 2016-07-24 (×2): 500 mg via ORAL
  Filled 2016-07-21 (×3): qty 1

## 2016-07-21 MED ORDER — MIRTAZAPINE 15 MG PO TABS
15.0000 mg | ORAL_TABLET | Freq: Every day | ORAL | Status: DC
  Administered 2016-07-21 – 2016-07-23 (×3): 15 mg via ORAL
  Filled 2016-07-21 (×3): qty 1

## 2016-07-21 MED ORDER — RIVAROXABAN 10 MG PO TABS
10.0000 mg | ORAL_TABLET | Freq: Every day | ORAL | Status: DC
  Administered 2016-07-22 – 2016-07-24 (×3): 10 mg via ORAL
  Filled 2016-07-21 (×3): qty 1

## 2016-07-21 MED ORDER — PHENOL 1.4 % MT LIQD: 1.0000 | OROMUCOSAL | Status: DC | PRN

## 2016-07-21 MED ORDER — SUCRALFATE 1 G PO TABS
1.0000 g | ORAL_TABLET | Freq: Four times a day (QID) | ORAL | Status: DC
  Administered 2016-07-21 – 2016-07-24 (×10): 1 g via ORAL
  Filled 2016-07-21 (×10): qty 1

## 2016-07-21 MED ORDER — MIDAZOLAM HCL 2 MG/2ML IJ SOLN
2.0000 mg | Freq: Once | INTRAMUSCULAR | Status: AC
  Administered 2016-07-21: 2 mg via INTRAVENOUS

## 2016-07-21 MED ORDER — KETAMINE HCL 10 MG/ML IJ SOLN
INTRAMUSCULAR | Status: AC
  Filled 2016-07-21: qty 1

## 2016-07-21 MED ORDER — MIDAZOLAM HCL 5 MG/5ML IJ SOLN
INTRAMUSCULAR | Status: DC | PRN
  Administered 2016-07-21: 2 mg via INTRAVENOUS

## 2016-07-21 MED ORDER — FENTANYL CITRATE (PF) 100 MCG/2ML IJ SOLN
INTRAMUSCULAR | Status: DC | PRN
  Administered 2016-07-21 (×2): 50 ug via INTRAVENOUS

## 2016-07-21 MED ORDER — ACETAMINOPHEN 650 MG RE SUPP: 650.0000 mg | Freq: Four times a day (QID) | RECTAL | Status: DC | PRN

## 2016-07-21 MED ORDER — CHLORHEXIDINE GLUCONATE 4 % EX LIQD: 60.0000 mL | Freq: Once | CUTANEOUS | Status: DC

## 2016-07-21 MED ORDER — PINDOLOL 5 MG PO TABS
5.0000 mg | ORAL_TABLET | Freq: Two times a day (BID) | ORAL | Status: DC
  Administered 2016-07-21 – 2016-07-24 (×6): 5 mg via ORAL
  Filled 2016-07-21 (×6): qty 1

## 2016-07-21 MED ORDER — BENZTROPINE MESYLATE 1 MG PO TABS
1.0000 mg | ORAL_TABLET | Freq: Every day | ORAL | Status: DC
  Administered 2016-07-21 – 2016-07-24 (×4): 1 mg via ORAL
  Filled 2016-07-21 (×4): qty 1

## 2016-07-21 MED ORDER — SODIUM CHLORIDE 0.9% FLUSH
INTRAVENOUS | Status: DC | PRN
  Administered 2016-07-21: 20 mL

## 2016-07-21 MED ORDER — MIDAZOLAM HCL 2 MG/2ML IJ SOLN
INTRAMUSCULAR | Status: AC
  Administered 2016-07-21: 2 mg via INTRAVENOUS
  Filled 2016-07-21: qty 2

## 2016-07-21 MED ORDER — CLONIDINE HCL (ANALGESIA) 100 MCG/ML EP SOLN
150.0000 ug | Freq: Once | EPIDURAL | Status: AC
  Administered 2016-07-21: 1 mL via INTRA_ARTICULAR
  Filled 2016-07-21: qty 1.5

## 2016-07-21 MED ORDER — FENTANYL CITRATE (PF) 250 MCG/5ML IJ SOLN
INTRAMUSCULAR | Status: AC
  Filled 2016-07-21: qty 5

## 2016-07-21 SURGICAL SUPPLY — 79 items
BANDAGE ACE 4X5 VEL STRL LF (GAUZE/BANDAGES/DRESSINGS) IMPLANT
BANDAGE ESMARK 6X9 LF (GAUZE/BANDAGES/DRESSINGS) IMPLANT
BLADE SAG 18X100X1.27 (BLADE) IMPLANT
BLADE SAW SGTL 13.0X1.19X90.0M (BLADE) IMPLANT
BNDG COHESIVE 6X5 TAN STRL LF (GAUZE/BANDAGES/DRESSINGS) IMPLANT
BNDG ELASTIC 6X10 VLCR STRL LF (GAUZE/BANDAGES/DRESSINGS) ×2 IMPLANT
BNDG ELASTIC 6X15 VLCR STRL LF (GAUZE/BANDAGES/DRESSINGS) IMPLANT
BNDG ESMARK 6X9 LF (GAUZE/BANDAGES/DRESSINGS)
BOWL SMART MIX CTS (DISPOSABLE) IMPLANT
CAPT KNEE TRIATH TK-4 ×2 IMPLANT
COVER SURGICAL LIGHT HANDLE (MISCELLANEOUS) ×2 IMPLANT
CUFF TOURNIQUET SINGLE 34IN LL (TOURNIQUET CUFF) ×2 IMPLANT
CUFF TOURNIQUET SINGLE 44IN (TOURNIQUET CUFF) IMPLANT
DECANTER SPIKE VIAL GLASS SM (MISCELLANEOUS) ×2 IMPLANT
DRAPE INCISE IOBAN 66X45 STRL (DRAPES) IMPLANT
DRAPE ORTHO SPLIT 77X108 STRL (DRAPES) ×2
DRAPE SURG ORHT 6 SPLT 77X108 (DRAPES) ×2 IMPLANT
DRAPE U-SHAPE 47X51 STRL (DRAPES) ×2 IMPLANT
DRSG AQUACEL AG ADV 3.5X10 (GAUZE/BANDAGES/DRESSINGS) ×2 IMPLANT
DRSG AQUACEL AG ADV 3.5X14 (GAUZE/BANDAGES/DRESSINGS) ×2 IMPLANT
DRSG PAD ABDOMINAL 8X10 ST (GAUZE/BANDAGES/DRESSINGS) ×4 IMPLANT
DURAPREP 26ML APPLICATOR (WOUND CARE) ×4 IMPLANT
ELECT CAUTERY BLADE 6.4 (BLADE) ×2 IMPLANT
ELECT REM PT RETURN 9FT ADLT (ELECTROSURGICAL) ×2
ELECTRODE REM PT RTRN 9FT ADLT (ELECTROSURGICAL) ×1 IMPLANT
GAUZE SPONGE 4X4 12PLY STRL (GAUZE/BANDAGES/DRESSINGS) ×2 IMPLANT
GLOVE BIOGEL PI IND STRL 7.5 (GLOVE) ×2 IMPLANT
GLOVE BIOGEL PI IND STRL 8 (GLOVE) ×1 IMPLANT
GLOVE BIOGEL PI IND STRL 8.5 (GLOVE) ×1 IMPLANT
GLOVE BIOGEL PI INDICATOR 7.5 (GLOVE) ×2
GLOVE BIOGEL PI INDICATOR 8 (GLOVE) ×1
GLOVE BIOGEL PI INDICATOR 8.5 (GLOVE) ×1
GLOVE ECLIPSE 7.0 STRL STRAW (GLOVE) ×2 IMPLANT
GLOVE SURG ORTHO 8.0 STRL STRW (GLOVE) ×2 IMPLANT
GLOVE SURG SS PI 7.5 STRL IVOR (GLOVE) ×2 IMPLANT
GLOVE SURG SS PI 8.0 STRL IVOR (GLOVE) ×2 IMPLANT
GOWN STRL REUS W/ TWL LRG LVL3 (GOWN DISPOSABLE) ×3 IMPLANT
GOWN STRL REUS W/TWL LRG LVL3 (GOWN DISPOSABLE) ×3
HANDPIECE INTERPULSE COAX TIP (DISPOSABLE)
HOOD PEEL AWAY FLYTE STAYCOOL (MISCELLANEOUS) ×6 IMPLANT
IMMOBILIZER KNEE 20 (SOFTGOODS) IMPLANT
IMMOBILIZER KNEE 22 (SOFTGOODS) ×2 IMPLANT
IMMOBILIZER KNEE 22 UNIV (SOFTGOODS) IMPLANT
IMMOBILIZER KNEE 24 THIGH 36 (MISCELLANEOUS) IMPLANT
IMMOBILIZER KNEE 24 UNIV (MISCELLANEOUS)
KIT BASIN OR (CUSTOM PROCEDURE TRAY) ×2 IMPLANT
KIT ROOM TURNOVER OR (KITS) ×2 IMPLANT
MANIFOLD NEPTUNE II (INSTRUMENTS) IMPLANT
NDL SAFETY ECLIPSE 18X1.5 (NEEDLE) ×1 IMPLANT
NEEDLE HYPO 18GX1.5 SHARP (NEEDLE) ×1
NEEDLE HYPO 22GX1.5 SAFETY (NEEDLE) ×2 IMPLANT
NEEDLE HYPO 25GX1X1/2 BEV (NEEDLE) ×4 IMPLANT
NEEDLE SPNL 18GX3.5 QUINCKE PK (NEEDLE) ×2 IMPLANT
NS IRRIG 1000ML POUR BTL (IV SOLUTION) ×4 IMPLANT
PACK TOTAL JOINT (CUSTOM PROCEDURE TRAY) ×2 IMPLANT
PAD ARMBOARD 7.5X6 YLW CONV (MISCELLANEOUS) ×4 IMPLANT
PAD CAST 4YDX4 CTTN HI CHSV (CAST SUPPLIES) IMPLANT
PADDING CAST COTTON 4X4 STRL (CAST SUPPLIES)
PADDING CAST COTTON 6X4 STRL (CAST SUPPLIES) IMPLANT
SET HNDPC FAN SPRY TIP SCT (DISPOSABLE) IMPLANT
SPONGE LAP 18X18 X RAY DECT (DISPOSABLE) IMPLANT
STRIP CLOSURE SKIN 1/2X4 (GAUZE/BANDAGES/DRESSINGS) ×4 IMPLANT
SUCTION FRAZIER HANDLE 10FR (MISCELLANEOUS) ×1
SUCTION TUBE FRAZIER 10FR DISP (MISCELLANEOUS) ×1 IMPLANT
SUT MNCRL AB 3-0 PS2 18 (SUTURE) ×2 IMPLANT
SUT VIC AB 0 CT1 27 (SUTURE) ×3
SUT VIC AB 0 CT1 27XBRD ANBCTR (SUTURE) ×3 IMPLANT
SUT VIC AB 1 CT1 27 (SUTURE) ×7
SUT VIC AB 1 CT1 27XBRD ANBCTR (SUTURE) ×7 IMPLANT
SUT VIC AB 2-0 CT1 27 (SUTURE) ×4
SUT VIC AB 2-0 CT1 TAPERPNT 27 (SUTURE) ×4 IMPLANT
SYR 30ML LL (SYRINGE) ×6 IMPLANT
SYR TB 1ML LUER SLIP (SYRINGE) ×4 IMPLANT
SYRINGE 20CC LL (MISCELLANEOUS) ×2 IMPLANT
TAPE STRIPS DRAPE STRL (GAUZE/BANDAGES/DRESSINGS) ×4 IMPLANT
TOWEL OR 17X24 6PK STRL BLUE (TOWEL DISPOSABLE) ×4 IMPLANT
TOWEL OR 17X26 10 PK STRL BLUE (TOWEL DISPOSABLE) ×4 IMPLANT
TRAY CATH 16FR W/PLASTIC CATH (SET/KITS/TRAYS/PACK) IMPLANT
WATER STERILE IRR 1000ML POUR (IV SOLUTION) IMPLANT

## 2016-07-21 NOTE — Transfer of Care (Signed)
Immediate Anesthesia Transfer of Care Note  Patient: [name]  Procedure(s) Performed: Procedure(s): TOTAL KNEE ARTHROPLASTY (Right)  Patient Location: PACU  Anesthesia Type:Spinal and MAC combined with regional for post-op pain  Level of Consciousness: alert , oriented, drowsy and patient cooperative  Airway & Oxygen Therapy: Patient Spontanous Breathing and Patient connected to face mask oxygen  Post-op Assessment: Report given to RN and Post -op Vital signs reviewed and stable  Post vital signs: Reviewed and stable  Last Vitals:  Vitals:   07/21/16 1206 07/21/16 1207  BP:    Pulse: (!) 102 (!) 103  Resp: 20 19  Temp:      Last Pain:  Vitals:   07/21/16 1112  TempSrc:   PainSc: 9          Complications: No apparent anesthesia complications

## 2016-07-21 NOTE — Anesthesia Procedure Notes (Signed)
Anesthesia Regional Block: Adductor canal block   Pre-Anesthetic Checklist: ,, timeout performed, Correct Patient, Correct Site, Correct Laterality, Correct Procedure, Correct Position, site marked, Risks and benefits discussed, pre-op evaluation,  At surgeon's request and post-op pain management  Laterality: Right  Prep: Maximum Sterile Barrier Precautions used, chloraprep       Needles:  Injection technique: Single-shot  Needle Type: Echogenic Stimulator Needle     Needle Length: 9cm  Needle Gauge: 21     Additional Needles:   Procedures: ultrasound guided,,,,,,,,  Narrative:  Start time: 07/21/2016 11:42 AM End time: 07/21/2016 11:52 AM Injection made incrementally with aspirations every 5 mL. Anesthesiologist: Roderic Palau  Additional Notes: 2% Lidocaine skin wheel.

## 2016-07-21 NOTE — H&P (Signed)
TOTAL KNEE ADMISSION H&P  Patient is being admitted for right total knee arthroplasty.  Subjective:  Chief Complaint:right knee pain.  HPI: [name], 51 y.o. male, has a history of pain and functional disability in the right knee due to arthritis and has failed non-surgical conservative treatments for greater than 12 weeks to includeNSAID's and/or analgesics, corticosteriod injections and activity modification.  Onset of symptoms was gradual, starting 7 years ago with gradually worsening course since that time. The patient noted no past surgery on the right knee(s).  Patient currently rates pain in the right knee(s) at 8 out of 10 with activity. Patient has night pain, worsening of pain with activity and weight bearing, pain that interferes with activities of daily living, pain with passive range of motion, crepitus and joint swelling.  Patient has evidence of subchondral sclerosis and joint space narrowing by imaging studies. This patient has had No family history or personal history of deep vein thrombosis or pulmonary embolism. There is no active infection.  Patient Active Problem List   Diagnosis Date Noted  . Primary osteoarthritis of right knee 05/27/2016  . Upper GI bleed 02/18/2015  . Benign essential HTN 02/18/2015  . Anemia of chronic disease 02/18/2015  . Acute alcohol intoxication ([place]) 02/18/2015  . Pulmonary nodule 02/18/2015  . Metabolic acidosis 32/44/0102  . Alcohol dependence with intoxication with complication ([place])   . Major depressive disorder, recurrent episode, moderate ([place])   . Cocaine abuse 03/02/2014  . History of schizophrenia 03/01/2014  . Tobacco abuse 12/06/2013  . Hematemesis 12/04/2013   Past Medical History:  Diagnosis Date  . Anxiety   . Arthritis   . Asthma   . GERD (gastroesophageal reflux disease)   . Pneumonia    hx  . Pre-diabetes   . Schizophrenia ([place])   . Stroke [facility])    2009-or10    Past Surgical History:  Procedure Laterality  Date  . ESOPHAGOGASTRODUODENOSCOPY Left 12/05/2013   Procedure: ESOPHAGOGASTRODUODENOSCOPY (EGD);  Surgeon: Arta Silence, MD;  Location: [facility];  Service: Endoscopy;  Laterality: Left;  . ESOPHAGOGASTRODUODENOSCOPY (EGD) WITH PROPOFOL N/A 02/20/2015   Procedure: ESOPHAGOGASTRODUODENOSCOPY (EGD) WITH PROPOFOL;  Surgeon: Wilford Corner, MD;  Location: [facility] ENDOSCOPY;  Service: Endoscopy;  Laterality: N/A;    Prescriptions Prior to Admission  Medication Sig Dispense Refill Last Dose  . albuterol (PROVENTIL HFA;VENTOLIN HFA) 108 (90 BASE) MCG/ACT inhaler Inhale 1 puff into the lungs 2 (two) times daily as needed for wheezing or shortness of breath.   07/21/2016 at Unknown time  . amLODipine (NORVASC) 2.5 MG tablet Take 2.5 mg by mouth daily.   07/21/2016 at Unknown time  . benztropine (COGENTIN) 1 MG tablet Take 1 mg by mouth daily.   07/20/2016 at Unknown time  . buPROPion (WELLBUTRIN XL) 150 MG 24 hr tablet Take 150 mg by mouth daily.   07/21/2016 at Unknown time  . folic acid (FOLVITE) 1 MG tablet Take 1 tablet (1 mg total) by mouth daily. For low folate 30 tablet 0 07/21/2016 at Unknown time  . ibuprofen (ADVIL,MOTRIN) 200 MG tablet Take 800 mg by mouth 4 (four) times daily.   ~ 1 week agi  . metFORMIN (GLUCOPHAGE-XR) 500 MG 24 hr tablet Take 500 mg by mouth daily with breakfast. D/c'd >yr  a1c ok   07/20/2016 at Unknown time  . mirtazapine (REMERON) 15 MG tablet Take 15 mg by mouth at bedtime.   07/20/2016 at Unknown time  . Paliperidone Palmitate (INVEGA TRINZA) 819 MG/2.625ML SUSP Inject  into the muscle every 3 (three) months. Next dose due May 4     . pantoprazole (PROTONIX) 40 MG tablet Take 40 mg by mouth 2 (two) times daily.    07/21/2016 at Unknown time  . pindolol (VISKEN) 5 MG tablet Take 1 tablet (5 mg total) by mouth 2 (two) times daily. For high blood pressure   07/21/2016 at Unknown time  . sucralfate (CARAFATE) 1 G tablet Take 1 tablet (1 g total) by mouth 4 (four) times daily. For  acid reflux 120 tablet 0 07/21/2016 at Unknown time  . valsartan (DIOVAN) 160 MG tablet Take 160 mg by mouth daily.   07/21/2016 at Unknown time   Allergies  Allergen Reactions  . Aspirin Swelling    Throat swelling     Social History  Substance Use Topics  . Smoking status: Heavy Tobacco Smoker    Packs/day: 0.50    Years: 13.00    Types: Cigarettes  . Smokeless tobacco: Never Used     Comment: .5 pk now  was 1.5pk  . Alcohol use 0.0 oz/week     Comment: cut bk to 1 beer wk    History reviewed. No pertinent family history.   Review of Systems  Musculoskeletal: Positive for joint pain.  All other systems reviewed and are negative.   Objective:  Physical Exam  Constitutional: He appears well-developed.  HENT:  Head: Normocephalic.  Eyes: Pupils are equal, round, and reactive to light.  Neck: Normal range of motion.  Cardiovascular: Normal rate.   Respiratory: Effort normal.  Neurological: He is alert.  Skin: Skin is warm.  Psychiatric: He has a normal mood and affect.  Examination of the right knee demonstrates range of motion 0-115 with an effusion collateral crucial ligaments are stable pedal pulses palpable extensor mechanism is intact skin is intact no groin pain with internal/external rotation of the leg.  Vital signs in last 24 hours: Temp:  [98.2 F (36.8 C)] 98.2 F (36.8 C) (04/24 1104) Pulse Rate:  [102] 102 (04/24 1104) Resp:  [20] 20 (04/24 1104) BP: (140)/(103) 140/103 (04/24 1104) SpO2:  [99 %] 99 % (04/24 1104)  Labs:   Estimated body mass index is 32.55 kg/m as calculated from the following:   Height as of 07/10/16: 5\' 9"  (1.753 m).   Weight as of 07/10/16: 220 lb 6.4 oz (100 kg).   Imaging Review Plain radiographs demonstrate moderate degenerative joint disease of the right knee(s). The overall alignment ismild varus. The bone quality appears to be excellent for age and reported activity level.  Assessment/Plan:  End stage arthritis, right  knee   The patient history, physical examination, clinical judgment of the provider and imaging studies are consistent with end stage degenerative joint disease of the right knee(s) and total knee arthroplasty is deemed medically necessary. The treatment options including medical management, injection therapy arthroscopy and arthroplasty were discussed at length. The risks and benefits of total knee arthroplasty were presented and reviewed. The risks due to aseptic loosening, infection, stiffness, patella tracking problems, thromboembolic complications and other imponderables were discussed. The patient acknowledged the explanation, agreed to proceed with the plan and consent was signed. Patient is being admitted for inpatient treatment for surgery, pain control, PT, OT, prophylactic antibiotics, VTE prophylaxis, progressive ambulation and ADL's and discharge planning. The patient is planning to be discharged to skilled nursing facility

## 2016-07-21 NOTE — Progress Notes (Signed)
Patient complaining of pain 10 out of 10. No due medication at this time. MD paged to notify. Awaiting a call back.

## 2016-07-21 NOTE — Progress Notes (Signed)
Orthopedic Tech Progress Note Patient Details:  [name] 08/31/1965 [id number]  CPM Right Knee CPM Right Knee: On Right Knee Flexion (Degrees): 40 Right Knee Extension (Degrees): 0  Ortho Devices Ortho Device/Splint Location: footsie roll Ortho Device/Splint Interventions: Ordered, Application, Adjustment   Braulio Bosch 07/21/2016, 4:02 PM

## 2016-07-21 NOTE — Brief Op Note (Signed)
07/21/2016  3:26 PM  PATIENT:  [name]  51 y.o. male  PRE-OPERATIVE DIAGNOSIS:  RIGHT KNEE OSTEOARTHRITIS  POST-OPERATIVE DIAGNOSIS:  RIGHT KNEE OSTEOARTHRITIS  PROCEDURE:  Procedure(s): TOTAL KNEE ARTHROPLASTY  SURGEON:  Surgeon(s): Meredith Pel, MD  ASSISTANT: Laure Kidney rnfa  ANESTHESIA:   spinal  EBL: 150 ml    Total I/O In: 1000 [I.V.:1000] Out: 50 [Blood:50]  BLOOD ADMINISTERED: none  DRAINS: none   LOCAL MEDICATIONS USED:  Marcaine morphine clonidine exparel  SPECIMEN:  No Specimen  COUNTS:  YES  TOURNIQUET:   Total Tourniquet Time Documented: area (laterality) - 92 minutes Total: area (laterality) - 92 minutes   DICTATION: .Other Dictation: Dictation Number [id number]  PLAN OF CARE: Admit to inpatient   PATIENT DISPOSITION:  PACU - hemodynamically stable

## 2016-07-21 NOTE — Anesthesia Procedure Notes (Signed)
Procedure Name: MAC Date/Time: 07/21/2016 12:20 PM Performed by: Mervyn Gay Pre-anesthesia Checklist: Patient identified, Patient being monitored, Timeout performed, Emergency Drugs available and Suction available Patient Re-evaluated:Patient Re-evaluated prior to inductionOxygen Delivery Method: Simple face mask Preoxygenation: Pre-oxygenation with 100% oxygen Number of attempts: 1 Placement Confirmation: positive ETCO2 Dental Injury: Teeth and Oropharynx as per pre-operative assessment

## 2016-07-21 NOTE — Anesthesia Preprocedure Evaluation (Addendum)
Anesthesia Evaluation  Patient identified by MRN, date of birth, ID band Patient awake    Reviewed: Allergy & Precautions, H&P , NPO status , Patient's Chart, lab work & pertinent test results  Airway Mallampati: II  TM Distance: >3 FB Neck ROM: Full    Dental no notable dental hx. (+) Teeth Intact, Dental Advisory Given   Pulmonary asthma , Current Smoker,    Pulmonary exam normal breath sounds clear to auscultation       Cardiovascular hypertension, Pt. on medications  Rhythm:Regular Rate:Normal     Neuro/Psych Anxiety Depression Schizophrenia negative neurological ROS  negative psych ROS   GI/Hepatic Neg liver ROS, GERD  Medicated and Controlled,  Endo/Other  diabetes, Type 2, Oral Hypoglycemic Agents  Renal/GU negative Renal ROS  negative genitourinary   Musculoskeletal  (+) Arthritis , Osteoarthritis,    Abdominal   Peds  Hematology negative hematology ROS (+) anemia ,   Anesthesia Other Findings   Reproductive/Obstetrics negative OB ROS                           Anesthesia Physical Anesthesia Plan  ASA: II  Anesthesia Plan: Spinal   Post-op Pain Management:  Regional for Post-op pain   Induction: Intravenous  Airway Management Planned: Simple Face Mask  Additional Equipment:   Intra-op Plan:   Post-operative Plan:   Informed Consent: I have reviewed the patients History and Physical, chart, labs and discussed the procedure including the risks, benefits and alternatives for the proposed anesthesia with the patient or authorized representative who has indicated his/her understanding and acceptance.   Dental advisory given  Plan Discussed with: CRNA  Anesthesia Plan Comments:         Anesthesia Quick Evaluation

## 2016-07-22 ENCOUNTER — Encounter (HOSPITAL_COMMUNITY): Payer: Self-pay | Admitting: General Practice

## 2016-07-22 NOTE — Progress Notes (Signed)
Subjective: Pt stable - pain ok   Objective: Vital signs in last 24 hours: Temp:  [97.7 F (36.5 C)-99.9 F (37.7 C)] 98.8 F (37.1 C) (04/25 0438) Pulse Rate:  [77-108] 108 (04/25 0438) Resp:  [15-37] 15 (04/25 0438) BP: (100-145)/(67-107) 115/72 (04/25 0438) SpO2:  [95 %-100 %] 95 % (04/25 0438)  Intake/Output from previous day: 04/24 0701 - 04/25 0700 In: 1993 [P.O.:240; I.V.:1643; IV Piggyback:110] Out: 1450 [Urine:1400; Blood:50] Intake/Output this shift: No intake/output data recorded.  Exam:  Intact pulses distally  Labs: No results for input(s): HGB in the last 72 hours. No results for input(s): WBC, RBC, HCT, PLT in the last 72 hours. No results for input(s): NA, K, CL, CO2, BUN, CREATININE, GLUCOSE, CALCIUM in the last 72 hours. No results for input(s): LABPT, INR in the last 72 hours.  Assessment/Plan: Plan pt and cpm today   G Alphonzo Severance 07/22/2016, 7:22 AM

## 2016-07-22 NOTE — Progress Notes (Signed)
07/22/16 1627  PT Visit Information  Last PT Received On 07/22/16  Assistance Needed +1  History of Present Illness Pt is a 51 y.o. male s/p R TKA on 07/21/16. PMH significant for upper GI bleed, benign essential HTN, anemia of chronic disease, cocaine abuse, schizophrenia, GERD, pneumonia, stroke, anxiety, and arthritis.  Subjective Data  Patient Stated Goal to get his pain better  Precautions  Precautions Knee  Precaution Booklet Issued Yes (comment)  Precaution Comments knee exercise handout given and reviewed no pillow under operative knee rule  Required Braces or Orthoses Knee Immobilizer - Right  Knee Immobilizer - Right On at all times  Restrictions  Weight Bearing Restrictions Yes  RLE Weight Bearing WBAT  Pain Assessment  Pain Assessment Faces  Faces Pain Scale 6  Pain Location R knee  Pain Descriptors / Indicators Aching;Discomfort;Operative site guarding  Pain Intervention(s) Limited activity within patient's tolerance;Monitored during session;Repositioned;Ice applied  Cognition  Arousal/Alertness Awake/alert  Behavior During Therapy WFL for tasks assessed/performed  Overall Cognitive Status History of cognitive impairments - at baseline  Bed Mobility  Overal bed mobility Needs Assistance  Bed Mobility Sit to Supine  Sit to supine Min assist  General bed mobility comments Min assist to help lift his right leg into bed from sitting  Transfers  Overall transfer level Needs assistance  Equipment used Rolling walker (2 wheeled)  Transfers Sit to/from Stand  Sit to Stand Min assist  General transfer comment Min assist from lower recliner chair to support trunk and stabilize RW.  Verbal cues to push up from chair armrests, not pull on RW handles.   Ambulation/Gait  Ambulation/Gait assistance Min assist  Ambulation Distance (Feet) 60 Feet (x2 with one seated rest break)  Assistive device Rolling walker (2 wheeled)  Gait Pattern/deviations Step-to pattern;Ataxic  General  Gait Details Pt continued to have moderately antalgic gait pattern, verbal cues for upright posture.  Pt got tired at the half way point and had to sit down and rest.    Gait velocity decreased  Gait velocity interpretation Below normal speed for age/gender  Balance  Overall balance assessment Needs assistance  Sitting-balance support No upper extremity supported;Feet supported  Sitting balance-[name] Good  Sitting balance - Comments Better this PM  Standing balance support Bilateral upper extremity supported;No upper extremity supported;During functional activity  Standing balance-[name] Poor  Standing balance comment Min assist for initial LOB posteriorly when he came to standing.   General Comments  General comments (skin integrity, edema, etc.) Pt saw OT between our sessions.   Exercises  Exercises Total Joint  Total Joint Exercises  Short Arc [place];Right;10 reps  Hip ABduction/ADduction AAROM;Right;10 reps  Straight Leg Raises AROM;Right;10 reps  PT - End of Session  Equipment Utilized During Treatment Gait belt;Right knee immobilizer  Activity Tolerance Patient limited by pain  Patient left with call bell/phone within reach;with family/visitor present;in bed;in CPM;with bed alarm set  CPM Right Knee  CPM Right Knee On  Right Knee Flexion (Degrees) 45  Right Knee Extension (Degrees) 0  PT - Assessment/Plan  PT Plan Current plan remains appropriate  PT Visit Diagnosis Muscle weakness (generalized) (M62.81);Difficulty in walking, not elsewhere classified (R26.2);Pain  Pain - Right/Left Right  Pain - part of body Knee  PT Frequency (ACUTE ONLY) 7X/week  Follow Up Recommendations SNF  PT equipment Rolling walker with 5" wheels;3in1 (PT)  AM-PAC PT "6 Clicks" Daily Activity Outcome Measure  Difficulty turning over in bed (including adjusting bedclothes, sheets and blankets)?  1  Difficulty moving from lying on back to sitting on the side of the bed?  1  Difficulty  sitting down on and standing up from a chair with arms (e.g., wheelchair, bedside commode, etc,.)? 1  Help needed moving to and from a bed to chair (including a wheelchair)? 3  Help needed walking in hospital room? 3  Help needed climbing 3-5 steps with a railing?  2  6 Click Score 11  Mobility G Code  CL  Acute Rehab PT Goals  PT Goal Formulation With patient  Time For Goal Achievement 07/29/16  Potential to Achieve Goals Good  PT Time Calculation  PT Start Time (ACUTE ONLY) 1536  PT Stop Time (ACUTE ONLY) 1559  PT Time Calculation (min) (ACUTE ONLY) 23 min  PT General Charges  $$ ACUTE PT VISIT 1 Procedure  PT Treatments  $Gait Training 8-22 mins  $Therapeutic Exercise 8-22 mins  07/22/2016 Pt is POD #1 and this is his second session.  We were able to progress gait into the hallway a bit, but he has poor endurance and needed a seated rest break before returning to the room.  Pt also needed min assist for 1-2 LOB during session.  He would benefit from SNF level rehab before returning home with fiancee who cannot provide physical assistance.  Barbarann Ehlers [place], [place], DPT [phone]

## 2016-07-22 NOTE — Evaluation (Signed)
Physical Therapy Evaluation Patient Details Name: [name] MRN: [id number] DOB: 09-Feb-1966 Today's Date: 07/22/2016   History of Present Illness  Pt is a 51 y.o. male s/p R TKA on 07/21/16. PMH significant for upper GI bleed, benign essential HTN, anemia of chronic disease, cocaine abuse, schizophrenia, GERD, pneumonia, stroke, anxiety, and arthritis.  Clinical Impression  Pt is POD #1 and needs min assist overall to get from bed to chair.  He is limited this AM by significant pain.  HEP program initiated and pt's fiancee reporting that they are planning to go to SNF for rehab.   PT to follow acutely for deficits listed below.       Follow Up Recommendations SNF    Equipment Recommendations  Rolling walker with 5" wheels;3in1 (PT)    Recommendations for Other Services   NA    Precautions / Restrictions Precautions Precautions: Knee Precaution Booklet Issued: Yes (comment) Precaution Comments: knee exercise handout given and reviewed Required Braces or Orthoses: Knee Immobilizer - Right Knee Immobilizer - Right: On at all times Restrictions Weight Bearing Restrictions: Yes RLE Weight Bearing: Weight bearing as tolerated      Mobility  Bed Mobility Overal bed mobility: Needs Assistance Bed Mobility: Supine to Sit     Supine to sit: Min guard     General bed mobility comments: Min guard assist to steady.  Transfers Overall transfer level: Needs assistance Equipment used: Rolling walker (2 wheeled) Transfers: Sit to/from Stand Sit to Stand: Min assist         General transfer comment: Min assist to support trunk for balance during transitions.  Verbal cues for safe hand placement.   Ambulation/Gait Ambulation/Gait assistance: Min assist Ambulation Distance (Feet): 8 Feet Assistive device: Rolling walker (2 wheeled) Gait Pattern/deviations: Step-to pattern;Ataxic     General Gait Details: Pt with moderately antalgic gait pattern, did not want to put foot on  the ground.  Verbal cues for safe RW use and correct LE sequencing.       Balance Overall balance assessment: Needs assistance Sitting-balance support: No upper extremity supported;Feet supported Sitting balance-[name]: Fair Sitting balance - Comments: some slight tendancy towards posterior leaning.    Standing balance support: Bilateral upper extremity supported;No upper extremity supported;During functional activity Standing balance-[name]: Poor Standing balance comment: needs RW and external assist.                              Pertinent Vitals/Pain Pain Assessment: 0-10 Pain Score: 10-Worst pain ever Pain Location: R knee Pain Descriptors / Indicators: Aching;Discomfort;Operative site guarding Pain Intervention(s): Limited activity within patient's tolerance;Monitored during session;Repositioned;RN gave pain meds during session;Ice applied    Home Living Family/patient expects to be discharged to:: Skilled nursing facility Living Arrangements: Spouse/significant other (fiancee has had multiple back surgeries and cannot assist) Available Help at Discharge: Family;Available 24 hours/day Type of Home: Apartment Home Access: Stairs to enter   Entrance Stairs-Number of Steps: 3 Home Layout: One level Home Equipment: Walker - 2 wheels;Cane - single point Additional Comments: per fiancee's report, pt's bed is on the floor/ground and is very low.     Prior Function Level of Independence: Independent               Hand Dominance   Dominant Hand: Right    Extremity/Trunk Assessment   Upper Extremity Assessment Upper Extremity Assessment: Defer to OT evaluation    Lower Extremity Assessment Lower Extremity Assessment:  RLE deficits/detail RLE Deficits / Details: right leg with normal post op pain and weakness, ankle 3/5, knee 1/5, hip flexion 2/5 RLE Sensation:  (intact to LT)    Cervical / Trunk Assessment Cervical / Trunk Assessment: Normal   Communication   Communication: No difficulties  Cognition Arousal/Alertness: Awake/alert Behavior During Therapy: WFL for tasks assessed/performed Overall Cognitive Status: History of cognitive impairments - at baseline                                 General Comments: Pt seems reliant on fiancee to answer history questions accurately.        General Comments General comments (skin integrity, edema, etc.): Pt reports he is to have his other knee done and needs his left ankle fused.     Exercises Total Joint Exercises Ankle Circles/Pumps: AROM;Both;20 reps Quad Sets: AROM;Right;10 reps Towel Squeeze: AROM;Both;10 reps Heel Slides: AAROM;Right;10 reps Goniometric ROM: 15-65   Assessment/Plan    PT Assessment Patient needs continued PT services  PT Problem List Decreased strength;Decreased range of motion;Decreased activity tolerance;Decreased balance;Decreased mobility;Decreased knowledge of use of DME;Decreased knowledge of precautions;Pain       PT Treatment Interventions DME instruction;Gait training;Stair training;Functional mobility training;Therapeutic activities;Therapeutic exercise;Balance training;Patient/family education;Manual techniques;Modalities    PT Goals (Current goals can be found in the Care Plan section)  Acute Rehab PT Goals Patient Stated Goal: to get his pain better PT Goal Formulation: With patient Time For Goal Achievement: 07/29/16 Potential to Achieve Goals: Good    Frequency 7X/week           End of Session Equipment Utilized During Treatment: Gait belt;Right knee immobilizer Activity Tolerance: Patient limited by pain Patient left: in chair;with call bell/phone within reach;with family/visitor present Nurse Communication: Mobility status PT Visit Diagnosis: Muscle weakness (generalized) (M62.81);Difficulty in walking, not elsewhere classified (R26.2);Pain Pain - Right/Left: Right Pain - part of body: Knee    Time:  1208-1229 PT Time Calculation (min) (ACUTE ONLY): 21 min   Charges:   PT Evaluation $PT Eval Moderate Complexity: 1 Procedure         Annaleese Guier B. Ulrich Soules, PT, DPT [phone]   07/22/2016, 4:24 PM

## 2016-07-22 NOTE — Anesthesia Postprocedure Evaluation (Addendum)
Anesthesia Post Note  Patient: Ersel Enslin Supple  Procedure(s) Performed: Procedure(s) (LRB): TOTAL KNEE ARTHROPLASTY (Right)  Patient location during evaluation: PACU Anesthesia Type: Spinal Level of consciousness: oriented and awake and alert Pain management: pain level not controlled Vital Signs Assessment: post-procedure vital signs reviewed and stable Respiratory status: spontaneous breathing, respiratory function stable and patient connected to nasal cannula oxygen Cardiovascular status: blood pressure returned to baseline and stable Postop Assessment: no headache and no backache Anesthetic complications: no       Last Vitals:  Vitals:   07/22/16 0041 07/22/16 0438  BP: (!) 132/95 115/72  Pulse:  (!) 108  Resp: 15 15  Temp: 37.7 C 37.1 C    Last Pain:  Vitals:   07/22/16 0641  TempSrc:   PainSc: 10-Worst pain ever                 Jakel Alphin,JAMES TERRILL

## 2016-07-22 NOTE — Evaluation (Signed)
Occupational Therapy Evaluation Patient Details Name: [name] MRN: [id number] DOB: 1965-04-10 Today's Date: 07/22/2016    History of Present Illness Pt is a 51 y.o. male s/p R TKA. PMH significant for upper GI bleed, benign essential HTN, anemia of chronic disease, cocaine abuse, schizophrenia, GERD, pneumonia, stroke, anxiety, and arthritis.   Clinical Impression   PTA, pt was independent with ADL and functional mobility. Pt currently requires mod assist for LB ADL and min assist for toilet transfers. He presents with decreased standing balance limiting ability to complete standing ADL tasks at sink as well. Pt would benefit from continued OT services while admitted to improve independence with ADL and functional mobility. Recommend short-term SNF placement post-acute D/C in order to maximize return to PLOF. Will continue to follow while admitted.    Follow Up Recommendations  SNF;Supervision/Assistance - 24 hour    Equipment Recommendations  Other (comment) (TBD at next venue of care)    Recommendations for Other Services       Precautions / Restrictions Precautions Precautions: Knee Precaution Booklet Issued: No Required Braces or Orthoses: Knee Immobilizer - Right Knee Immobilizer - Right: On at all times Restrictions Weight Bearing Restrictions: Yes RLE Weight Bearing: Weight bearing as tolerated      Mobility Bed Mobility Overal bed mobility: Needs Assistance Bed Mobility: Supine to Sit     Supine to sit: Min guard     General bed mobility comments: Min guard assist to steady.  Transfers Overall transfer level: Needs assistance Equipment used: Rolling walker (2 wheeled) Transfers: Sit to/from Stand Sit to Stand: Min assist         General transfer comment: Min assist for steadying and lifting from bed.    Balance Overall balance assessment: Needs assistance Sitting-balance support: No upper extremity supported;Feet supported Sitting balance-Leahy  Scale: Fair     Standing balance support: Bilateral upper extremity supported;No upper extremity supported;During functional activity Standing balance-[name]: Poor Standing balance comment: Reliant on B UE support or external support to maintain balance.                           ADL either performed or assessed with clinical judgement   ADL Overall ADL's : Needs assistance/impaired Eating/Feeding: Set up;Sitting   Grooming: Minimal assistance;Standing   Upper Body Bathing: Set up;Sitting   Lower Body Bathing: Moderate assistance;Sit to/from stand   Upper Body Dressing : Set up;Sitting   Lower Body Dressing: Moderate assistance;Sit to/from stand   Toilet Transfer: Minimal assistance;Ambulation;RW;BSC   Toileting- Clothing Manipulation and Hygiene: Minimal assistance;Sit to/from stand       Functional mobility during ADLs: Minimal assistance;Rolling walker General ADL Comments: Pt educated on knee precautions during ADL as well as fall prevention and use of ice for pain management.     Vision Patient Visual Report: No change from baseline Vision Assessment?: No apparent visual deficits     Perception     Praxis      Pertinent Vitals/Pain Pain Assessment: 0-10 Pain Score: 10-Worst pain ever Pain Location: R knee Pain Descriptors / Indicators: Aching;Discomfort;Operative site guarding Pain Intervention(s): Limited activity within patient's tolerance;Monitored during session;Repositioned     Hand Dominance Right   Extremity/Trunk Assessment Upper Extremity Assessment Upper Extremity Assessment: Overall WFL for tasks assessed   Lower Extremity Assessment Lower Extremity Assessment: RLE deficits/detail RLE Deficits / Details: Decreased strength and ROM as expected post-operatively.       Communication  Cognition Arousal/Alertness: Awake/alert Behavior During Therapy: WFL for tasks assessed/performed                                    General Comments: Per PT, pt with memory deficits at baseline.   General Comments       Exercises     Shoulder Instructions      Home Living Family/patient expects to be discharged to:: Skilled nursing facility Living Arrangements: Spouse/significant other Available Help at Discharge: Family;Available 24 hours/day Type of Home: Apartment Home Access: Stairs to enter Entrance Stairs-Number of Steps: 3   Home Layout: One level     Bathroom Shower/Tub: Tub/shower unit         Home Equipment: Environmental consultant - 2 wheels;Cane - single point          Prior Functioning/Environment Level of Independence: Independent                 OT Problem List: Decreased strength;Decreased range of motion;Decreased activity tolerance;Impaired balance (sitting and/or standing);Decreased safety awareness;Decreased knowledge of use of DME or AE;Decreased knowledge of precautions;Pain      OT Treatment/Interventions: Self-care/ADL training;Therapeutic exercise;Energy conservation;DME and/or AE instruction;Patient/family education;Balance training    OT Goals(Current goals can be found in the care plan section) Acute Rehab OT Goals Patient Stated Goal: to go home OT Goal Formulation: With patient Time For Goal Achievement: 08/05/16 Potential to Achieve Goals: Good ADL Goals Pt Will Perform Grooming: with modified independence;standing Pt Will Perform Lower Body Bathing: with modified independence;sit to/from stand Pt Will Perform Lower Body Dressing: with modified independence;sit to/from stand Pt Will Transfer to Toilet: with modified independence;bedside commode;ambulating Pt Will Perform Toileting - Clothing Manipulation and hygiene: with modified independence;sit to/from stand  OT Frequency: Min 2X/week   Barriers to D/C:            Co-evaluation              End of Session Equipment Utilized During Treatment: Gait belt;Rolling walker;Right knee immobilizer Nurse  Communication: Mobility status  Activity Tolerance: Patient tolerated treatment well Patient left: in chair;with call bell/phone within reach  OT Visit Diagnosis: Other abnormalities of gait and mobility (R26.89);Pain Pain - Right/Left: Right Pain - part of body: Knee                Time: 1450-1511 OT Time Calculation (min): 21 min Charges:  OT General Charges $OT Visit: 1 Procedure OT Evaluation $OT Eval Moderate Complexity: 1 Procedure G-Codes:     Doristine Section, MS OTR/L  Pager: [phone]   Herson Prichard A Connor Foxworthy 07/22/2016, 4:05 PM

## 2016-07-22 NOTE — Op Note (Signed)
NAME:  [name], [name]                     ACCOUNT NO.:  MEDICAL RECORD NO.:  [id number]  LOCATION:                                 FACILITY:  PHYSICIAN:  Burnard Bunting, M.D.    DATE OF BIRTH:  03-29-1966  DATE OF PROCEDURE: DATE OF DISCHARGE:                              OPERATIVE REPORT   PREOPERATIVE DIAGNOSIS:  Right knee arthritis.  POSTOPERATIVE DIAGNOSIS:  Right knee arthritis.  PROCEDURE:  Right total knee replacement, press-fit Stryker triathlon posterior cruciate retaining, 5 femur, 6 tibia, 11 mm polyethylene insert, 32 mm 3-peg press-fit patella.  SURGEON:  Burnard Bunting, M.D.  ASSIST:  Patrick Jupiter, RNFA.  INDICATIONS:  [name] is a 51 year old patient with end-stage right knee arthritis, who presents for operative management after explanation of risks and benefits.  PROCEDURE IN DETAIL:  The patient was brought to the operating room where spinal anesthetic was induced.  Preoperative antibiotics were administered.  Time-out was called.  Right leg was prescrubbed with alcohol and Betadine, allowed to air dry, prepped with DuraPrep solution and draped in a sterile manner.  Time-out was called.  Collier Flowers was used to cover the operative field.  Leg was elevated and exsanguinated with the Esmarch and tourniquet was inflated.  Anterior approach to the knee was made.  Skin and subcutaneous tissue were sharply divided.  Median parapatellar approach was made and marked with a #1 Vicryl suture.  The patient had pretty significant synovitis within the knee and this was removed with a synovectomy, primarily in the suprapatellar pouch.  The fat pad was partially excised, minimal soft tissue dissection performed medially.  The lateral patellofemoral ligament was then released. Intramedullary alignment was then utilized to cut the tibia in 3 degrees posterior slope perpendicular to the mechanical axis of the tibia.  A 9 mm resection made off the least affected lateral tibial  plateau. Intramedullary alignment was then utilized to cut the femur in 5 degrees of valgus.  An 8 mm cut was utilized.  Osteophytes were removed posteriorly.  The femur was sized to a size 5.  Anterior, posterior, chamfer cuts were made protecting the tibia.  Tibia was then keel punched to prepare for the press-fit.  The patella was then trimmed from 23 down to 13 mm and a 10 mm 32 3-peg patella trial was placed.  The trial components were then positioned.  The patient had very good patellar tracking.  Excellent alignment in the coronal plane along with good stability to varus and valgus stress at 0, 30, and 90 degrees.  The PCL was intact and was preserved.  At this time, thorough irrigation was performed.  Exparel solution was injected into the capsule.  Tranexamic acid sponge was placed into the knee joint for 3 minutes.  Nonpulsatile irrigation was then utilized and the true components were tapped into position with excellent press-fit obtained in very high quality bone. At this time, thorough irrigation was again performed.  Tourniquet was released.  Bleeding points encountered, controlled using electrocautery. Incision closed over bolster using #1 Vicryl suture for the arthrotomy, followed by 0 Vicryl suture, 2-0 Vicryl suture, and 3-0 Monocryl.  Steri-  Strips, Aquacel dressing applied with the knee in about 45 degrees of flexion.  The Ace wrap was then applied to the knee along with a knee immobilizer.  The patient tolerated the procedure well without immediate complication.  Transferred to the recovery room in stable condition.     Burnard Bunting, M.D.     GSD/MEDQ  D:  07/21/2016  T:  07/22/2016  Job:  [id number]

## 2016-07-23 ENCOUNTER — Encounter (HOSPITAL_COMMUNITY): Payer: Self-pay

## 2016-07-23 LAB — BASIC METABOLIC PANEL
Anion gap: 11 (ref 5–15)
BUN: <5 mg/dL — ABNORMAL LOW (ref 6–20)
CO2: 25 mmol/L (ref 22–32)
Calcium: 8.2 mg/dL — ABNORMAL LOW (ref 8.9–10.3)
Chloride: 95 mmol/L — ABNORMAL LOW (ref 101–111)
Creatinine, Ser: 0.96 mg/dL (ref 0.61–1.24)
GFR calc non Af Amer: >60 mL/min (ref >60)
Glucose, Bld: 151 mg/dL — ABNORMAL HIGH (ref 65–99)
POTASSIUM: 4.1 mmol/L (ref 3.5–5.1)
SODIUM: 131 mmol/L — AB (ref 135–145)

## 2016-07-23 LAB — CBC
HEMATOCRIT: 31.6 % — AB (ref 39.0–52.0)
HEMOGLOBIN: 10.8 g/dL — AB (ref 13.0–17.0)
MCH: 29.8 pg (ref 26.0–34.0)
MCHC: 34.2 g/dL (ref 30.0–36.0)
MCV: 87.1 fL (ref 78.0–100.0)
Platelets: 289 10*3/uL (ref 150–400)
RBC: 3.63 MIL/uL — AB (ref 4.22–5.81)
RDW: 14.7 % (ref 11.5–15.5)
WBC: 10.8 10*3/uL — ABNORMAL HIGH (ref 4.0–10.5)

## 2016-07-23 MED ORDER — METHOCARBAMOL 500 MG PO TABS: 500.0000 mg | ORAL_TABLET | Freq: Four times a day (QID) | ORAL | 0 refills | Status: DC | PRN

## 2016-07-23 MED ORDER — DOCUSATE SODIUM 100 MG PO CAPS: 100.0000 mg | ORAL_CAPSULE | Freq: Two times a day (BID) | ORAL | 0 refills | Status: DC

## 2016-07-23 MED ORDER — RIVAROXABAN 10 MG PO TABS: 10.0000 mg | ORAL_TABLET | Freq: Every day | ORAL | 0 refills | Status: DC

## 2016-07-23 MED ORDER — OXYCODONE HCL 5 MG PO TABS: 5.0000 mg | ORAL_TABLET | ORAL | 0 refills | Status: DC | PRN

## 2016-07-23 NOTE — Discharge Instructions (Signed)

## 2016-07-23 NOTE — Progress Notes (Signed)
Subjective: Pt stable - pain ok   Objective: Vital signs in last 24 hours: Temp:  [98.7 F (37.1 C)-99.9 F (37.7 C)] 99.3 F (37.4 C) (04/26 0420) Pulse Rate:  [102-114] 114 (04/26 0420) Resp:  [15] 15 (04/26 0420) BP: (110-126)/(81-88) 120/81 (04/26 0420) SpO2:  [96 %-99 %] 98 % (04/26 0420)  Intake/Output from previous day: 04/25 0701 - 04/26 0700 In: 600 [P.O.:600] Out: 1500 [Urine:1500] Intake/Output this shift: No intake/output data recorded.  Exam:  Dorsiflexion/Plantar flexion intact No cellulitis present  Labs:  Recent Labs  07/23/16 0655  HGB 10.8*    Recent Labs  07/23/16 0655  WBC 10.8*  RBC 3.63*  HCT 31.6*  PLT 289   No results for input(s): NA, K, CL, CO2, BUN, CREATININE, GLUCOSE, CALCIUM in the last 72 hours. No results for input(s): LABPT, INR in the last 72 hours.  Assessment/Plan: Plan snf am   G Alphonzo Severance 07/23/2016, 8:10 AM

## 2016-07-23 NOTE — Progress Notes (Signed)
Physical Therapy Treatment Patient Details Name: [name] MRN: [id number] DOB: July 19, 1965 Today's Date: 07/23/2016    History of Present Illness Pt is a 51 y.o. male s/p R TKA on 07/21/16. PMH significant for upper GI bleed, benign essential HTN, anemia of chronic disease, cocaine abuse, schizophrenia, GERD, pneumonia, stroke, anxiety, and arthritis.    PT Comments    Pt is POD 2 and continues to have increased complaints of right knee pain with mobility. Pain is rated at 9/10 with gait and improves to 7/10. Pt will benefit from SNF for short term rehab in order to maximize his functional outcomes due to his impaired endurance.     Follow Up Recommendations  SNF     Equipment Recommendations  Rolling walker with 5" wheels;3in1 (PT)    Recommendations for Other Services       Precautions / Restrictions Precautions Precautions: Knee Precaution Booklet Issued: Yes (comment) Precaution Comments: knee exercise handout given and reviewed no pillow under operative knee rule Required Braces or Orthoses: Knee Immobilizer - Right Knee Immobilizer - Right: On at all times Restrictions Weight Bearing Restrictions: Yes RLE Weight Bearing: Weight bearing as tolerated    Mobility  Bed Mobility Overal bed mobility: Needs Assistance Bed Mobility: Supine to Sit;Sit to Supine     Supine to sit: Min guard Sit to supine: Min assist   General bed mobility comments: Min assist to help lift his right leg into bed from sitting  Transfers Overall transfer level: Needs assistance Equipment used: Rolling walker (2 wheeled) Transfers: Sit to/from Stand Sit to Stand: Min assist         General transfer comment: Min A from EOB with cues for hand placement.   Ambulation/Gait Ambulation/Gait assistance: Min assist Ambulation Distance (Feet): 60 Feet (60 x2 with standing rest break) Assistive device: Rolling walker (2 wheeled) Gait Pattern/deviations: Step-to pattern;Antalgic Gait  velocity: decreased Gait velocity interpretation: Below normal speed for age/gender General Gait Details: Pt continues to have moderate antalgic gait with cues for sequencing.    Stairs            Wheelchair Mobility    Modified Rankin (Stroke Patients Only)       Balance Overall balance assessment: Needs assistance Sitting-balance support: No upper extremity supported;Feet supported Sitting balance-[name]: Good     Standing balance support: Bilateral upper extremity supported;No upper extremity supported;During functional activity Standing balance-[name]: Poor Standing balance comment: Relies on RW for stability in standing                            Cognition Arousal/Alertness: Awake/alert Behavior During Therapy: WFL for tasks assessed/performed Overall Cognitive Status: History of cognitive impairments - at baseline                                        Exercises      General Comments        Pertinent Vitals/Pain Pain Assessment: Faces Faces Pain Scale: Hurts even more Pain Location: R knee Pain Descriptors / Indicators: Aching;Discomfort;Operative site guarding Pain Intervention(s): Monitored during session;Ice applied;Repositioned;Limited activity within patient's tolerance    Home Living                      Prior Function            PT Goals (current  goals can now be found in the care plan section) Acute Rehab PT Goals Patient Stated Goal: to get his pain better Progress towards PT goals: Progressing toward goals    Frequency    7X/week      PT Plan Current plan remains appropriate    Co-evaluation             End of Session Equipment Utilized During Treatment: Gait belt;Right knee immobilizer Activity Tolerance: Patient limited by pain Patient left: in bed;with call bell/phone within reach   PT Visit Diagnosis: Muscle weakness (generalized) (M62.81);Difficulty in walking, not  elsewhere classified (R26.2);Pain Pain - Right/Left: Right Pain - part of body: Knee     Time: 8295-6213 PT Time Calculation (min) (ACUTE ONLY): 19 min  Charges:  $Gait Training: 8-22 mins                    G Codes:       Colin Broach PT, DPT  [phone]    [name] 07/23/2016, 3:04 PM

## 2016-07-23 NOTE — Progress Notes (Signed)
Orthopedic Tech Progress Note Patient Details:  [name] June 27, 1965 [id number]  Patient ID: [name], male   DOB: [birth year], 51 y.o.   MRN: [id number] Pt in to much pain to get in cpm. Will call when ready to go in cpm.  Karolee Stamps 07/23/2016, 6:14 AM

## 2016-07-23 NOTE — Clinical Social Work Note (Signed)
Clinical Social Work Assessment  Patient Details  Name: [name] MRN: [id number] Date of Birth: January 15, 1966  Date of referral:  07/23/16               Reason for consult:  Facility Placement                Permission sought to share information with:  Facility Art therapist granted to share information::  Yes, Verbal Permission Granted  Name::     Girlfriend  Agency::  SNF  Relationship::  Clinical biochemist Information:     Housing/Transportation Living arrangements for the past 2 months:  Apartment Source of Information:  Patient Patient Interpreter Needed:  None Criminal Activity/Legal Involvement Pertinent to Current Situation/Hospitalization:  No - Comment as needed Significant Relationships:  Significant Other Lives with:  Self, Significant Other Do you feel safe going back to the place where you live?  No Need for family participation in patient care:  No (Coment)  Care giving concerns:  Patient was independent prior to hospitalization. Patient resides in an apartment with significant other.  Patient not safe to return home at this time as he has no one to care for him with this impairment.  Social Worker assessment / plan:  CSW met with patient at bedside to discuss the recommendations for DC. CSw explained the DC plan and options. Patient indicated that he has never experienced a short term rehabilitation. CSW explained the SNF process and placement. Patient amenable to placement and gave verbal permission to send out offers to facilities in the [place] area.  Employment status:  Unemployed Forensic scientist:  Medicare PT Recommendations:  [place] / Referral to community resources:  [place]  Patient/Family's Response to care:  Patient is appreciative of CSW assistance. Patient reports no issues or concerns at this time.  Patient/Family's Understanding of and Emotional Response to Diagnosis, Current  Treatment, and Prognosis:  Patient understanding of diagnosis, current treatment and prognosis for DC. Patient is agreeable with SNF placement for DC. Pt is hopeful that short term rehabilitation will be effective and return him to his baseline prior to impairment.  Emotional Assessment Appearance:  Appears stated age Attitude/Demeanor/Rapport:    Affect (typically observed):   (Cooperative) Orientation:  Oriented to Self, Oriented to Place, Oriented to Situation Alcohol / Substance use:  Not Applicable Psych involvement (Current and /or in the community):  No (Comment)  Discharge Needs  Concerns to be addressed:  Care Coordination Readmission within the last 30 days:    Current discharge risk:  Dependent with Mobility, Physical Impairment Barriers to Discharge:  No Barriers Identified   Normajean Baxter, LCSW 07/23/2016, 11:31 AM

## 2016-07-23 NOTE — Discharge Summary (Signed)
Physician Discharge Summary  Patient ID: [name] MRN: [id number] DOB/AGE: 1965/08/24 51 y.o.  Admit date: 07/21/2016 Discharge date: 07/24/2016  Admission Diagnoses:  Active Problems:   Arthritis of knee   Discharge Diagnoses:  Same  Surgeries: Procedure(s): TOTAL KNEE ARTHROPLASTY on 07/21/2016   Consultants:   Discharged Condition: Stable  Hospital Course: [name] is an 51 y.o. male who was admitted 07/21/2016 with a chief complaint of right knee pain, and found to have a diagnosis of right knee arthritis.  They were brought to the operating room on 07/21/2016 and underwent the above named procedures.  Patient did well with the procedure.  He was mobilized with physical therapy on postop day #1.  By the time of discharge he was able to ambulate in the hallway but was still having weakness.  He will benefit from skilled nursing facility stay in order to improve strength.  He is discharged on Xarelto for DVT prophylaxis along with oxycodone for pain and Robaxin as a muscle relaxer.  I will see him back in approximately 10 days for clinical recheck  Antibiotics given:  Anti-infectives    Start     Dose/Rate Route Frequency Ordered Stop   07/21/16 1830  ceFAZolin (ANCEF) IVPB 2g/100 mL premix     2 g 200 mL/hr over 30 Minutes Intravenous Every 6 hours 07/21/16 1655 07/22/16 0005   07/21/16 1200  ceFAZolin (ANCEF) IVPB 2g/100 mL premix     2 g 200 mL/hr over 30 Minutes Intravenous To [facility]Stay [facility] 07/20/16 0919 07/21/16 1250    .  Recent vital signs:  Vitals:   07/23/16 0420 07/23/16 1534  BP: 120/81 115/71  Pulse: (!) 114 (!) 102  Resp: 15 16  Temp: 99.3 F (37.4 C) 99.2 F (37.3 C)    Recent laboratory studies:  Results for orders placed or performed during the hospital encounter of 07/21/16  Glucose, capillary  Result Value Ref Range   Glucose-Capillary 149 (H) 65 - 99 mg/dL  Glucose, capillary  Result Value Ref Range   Glucose-Capillary 112 (H) 65  - 99 mg/dL  CBC  Result Value Ref Range   WBC 10.8 (H) 4.0 - 10.5 K/uL   RBC 3.63 (L) 4.22 - 5.81 MIL/uL   Hemoglobin 10.8 (L) 13.0 - 17.0 g/dL   HCT 40.9 (L) 81.1 - 91.4 %   MCV 87.1 78.0 - 100.0 fL   MCH 29.8 26.0 - 34.0 pg   MCHC 34.2 30.0 - 36.0 g/dL   RDW 78.2 95.6 - 21.3 %   Platelets 289 150 - 400 K/uL  Basic metabolic panel  Result Value Ref Range   Sodium 131 (L) 135 - 145 mmol/L   Potassium 4.1 3.5 - 5.1 mmol/L   Chloride 95 (L) 101 - 111 mmol/L   CO2 25 22 - 32 mmol/L   Glucose, Bld 151 (H) 65 - 99 mg/dL   BUN <5 (L) 6 - 20 mg/dL   Creatinine, Ser 0.86 0.61 - 1.24 mg/dL   Calcium 8.2 (L) 8.9 - 10.3 mg/dL   GFR calc non Af Amer >60 >60 mL/min   GFR calc Af Amer >60 >60 mL/min   Anion gap 11 5 - 15    Discharge Medications:   Allergies as of 07/23/2016      Reactions   Aspirin Swelling   Throat swelling      Medication List    STOP taking these medications   ibuprofen 200 MG tablet Commonly known as:  ADVIL,MOTRIN  TAKE these medications   albuterol 108 (90 Base) MCG/ACT inhaler Commonly known as:  PROVENTIL HFA;VENTOLIN HFA Inhale 1 puff into the lungs 2 (two) times daily as needed for wheezing or shortness of breath.   amLODipine 2.5 MG tablet Commonly known as:  NORVASC Take 2.5 mg by mouth daily.   benztropine 1 MG tablet Commonly known as:  COGENTIN Take 1 mg by mouth daily.   buPROPion 150 MG 24 hr tablet Commonly known as:  WELLBUTRIN XL Take 150 mg by mouth daily.   docusate sodium 100 MG capsule Commonly known as:  COLACE Take 1 capsule (100 mg total) by mouth 2 (two) times daily.   folic acid 1 MG tablet Commonly known as:  FOLVITE Take 1 tablet (1 mg total) by mouth daily. For low folate   INVEGA TRINZA 819 MG/2.625ML Susp Generic drug:  Paliperidone Palmitate Inject into the muscle every 3 (three) months. Next dose due May 4   metFORMIN 500 MG 24 hr tablet Commonly known as:  GLUCOPHAGE-XR Take 500 mg by mouth daily with  breakfast. D/c'd >yr  a1c ok   methocarbamol 500 MG tablet Commonly known as:  ROBAXIN Take 1 tablet (500 mg total) by mouth every 6 (six) hours as needed for muscle spasms.   mirtazapine 15 MG tablet Commonly known as:  REMERON Take 15 mg by mouth at bedtime.   oxyCODONE 5 MG immediate release tablet Commonly known as:  Oxy IR/ROXICODONE Take 1-2 tablets (5-10 mg total) by mouth every 4 (four) hours as needed for breakthrough pain.   pantoprazole 40 MG tablet Commonly known as:  PROTONIX Take 40 mg by mouth 2 (two) times daily.   pindolol 5 MG tablet Commonly known as:  VISKEN Take 1 tablet (5 mg total) by mouth 2 (two) times daily. For high blood pressure   rivaroxaban 10 MG Tabs tablet Commonly known as:  XARELTO Take 1 tablet (10 mg total) by mouth daily with breakfast. Start taking on:  07/24/2016   sucralfate 1 g tablet Commonly known as:  CARAFATE Take 1 tablet (1 g total) by mouth 4 (four) times daily. For acid reflux   valsartan 160 MG tablet Commonly known as:  DIOVAN Take 160 mg by mouth daily.            Durable Medical Equipment        Start     Ordered   07/23/16 1546  For home use only DME Walker rolling  Once    Question:  Patient needs a walker to treat with the following condition  Answer:  S/P total knee arthroplasty, right   07/23/16 1546   07/23/16 1546  For home use only DME 3 n 1  Once     07/23/16 1546      Diagnostic Studies: Dg Chest 2 View  Result Date: 07/10/2016 CLINICAL DATA:  Preoperative knee replacement.  Asthma. EXAM: CHEST  2 VIEW COMPARISON:  May 28, 2015 FINDINGS: Lungs are clear. Heart size and pulmonary vascularity are normal. No adenopathy. No bone lesions. IMPRESSION: No edema or consolidation. Electronically Signed   By: Bretta Bang III M.D.   On: 07/10/2016 12:54    Disposition: 01-Home or Self Care  Discharge Instructions    Call MD / Call 911    Complete by:  As directed    If you experience chest  pain or shortness of breath, CALL 911 and be transported to the hospital emergency room.  If you develope a fever above  101 F, pus (white drainage) or increased drainage or redness at the wound, or calf pain, call your surgeon's office.   Constipation Prevention    Complete by:  As directed    Drink plenty of fluids.  Prune juice may be helpful.  You may use a stool softener, such as Colace (over the counter) 100 mg twice a day.  Use MiraLax (over the counter) for constipation as needed.   Diet - low sodium heart healthy    Complete by:  As directed    Discharge instructions    Complete by:  As directed    Okay to shower with dressing in place.  The dressing is waterproof Range of motion and strengthening right knee as tolerated CPM machine if available 6 hours per day minimum Okay to remove dressing on Friday, May 4   Increase activity slowly as tolerated    Complete by:  As directed          Signed: Burnard Bunting 07/23/2016, 5:26 PM

## 2016-07-23 NOTE — [place] FL2 (Signed)
[place] MEDICAID FL2 LEVEL OF CARE SCREENING TOOL     IDENTIFICATION  Patient Name: [name] Birthdate: 25-Nov-1965 Sex: male Admission Date (Current Location): 07/21/2016  [facility] and [place] Number:  Producer, television/film/video and Address:  [facility] [place]. [facility], [address], [place], [place] [address]      Provider Number: [id number]  Attending Physician Name and Address:  Cammy Copa, MD  Relative Name and Phone Number:       Current Level of Care: Hospital Recommended Level of Care: Skilled Nursing Facility Prior Approval Number:    Date Approved/Denied:   PASRR Number:    Discharge Plan: SNF    Current Diagnoses: Patient Active Problem List   Diagnosis Date Noted  . Arthritis of knee 07/21/2016  . Primary osteoarthritis of right knee 05/27/2016  . Upper GI bleed 02/18/2015  . Benign essential HTN 02/18/2015  . Anemia of chronic disease 02/18/2015  . Acute alcohol intoxication (HCC) 02/18/2015  . Pulmonary nodule 02/18/2015  . Metabolic acidosis 02/18/2015  . Alcohol dependence with intoxication with complication (HCC)   . Major depressive disorder, recurrent episode, moderate (HCC)   . Cocaine abuse 03/02/2014  . History of schizophrenia 03/01/2014  . Tobacco abuse 12/06/2013  . Hematemesis 12/04/2013    Orientation RESPIRATION BLADDER Height & Weight     Self, Time, Situation, Place  Normal Continent Weight: 100 kg (220 lb 7.4 oz) Height:  5\' 9"  (175.3 cm)  BEHAVIORAL SYMPTOMS/MOOD NEUROLOGICAL BOWEL NUTRITION STATUS      Continent Diet (Please see DC Summary)  AMBULATORY STATUS COMMUNICATION OF NEEDS Skin   Limited Assist Verbally Surgical wounds (Closed incision on leg)                       Personal Care Assistance Level of Assistance  Bathing, Feeding, Dressing Bathing Assistance: Limited assistance Feeding assistance: Independent Dressing Assistance: Limited assistance     Functional Limitations Info              SPECIAL CARE FACTORS FREQUENCY  PT (By licensed PT), OT (By licensed OT)     PT Frequency: 5x/week OT Frequency: 3x/week            Contractures      Additional Factors Info  Code Status, Allergies, Psychotropic Code Status Info: Full Allergies Info: Aspirin Psychotropic Info: Wellbutrin         Current Medications (07/23/2016):  This is the current hospital active medication list Current Facility-Administered Medications  Medication Dose Route Frequency Provider Last Rate Last Dose  . acetaminophen (TYLENOL) tablet 650 mg  650 mg Oral Q6H PRN Cammy Copa, MD   650 mg at 07/23/16 1532   Or  . acetaminophen (TYLENOL) suppository 650 mg  650 mg Rectal Q6H PRN Cammy Copa, MD      . albuterol (PROVENTIL) (2.5 MG/3ML) 0.083% nebulizer solution 2.5 mg  2.5 mg Nebulization BID PRN Cammy Copa, MD      . amLODipine (NORVASC) tablet 2.5 mg  2.5 mg Oral Daily Cammy Copa, MD   2.5 mg at 07/23/16 1016  . benztropine (COGENTIN) tablet 1 mg  1 mg Oral Daily Cammy Copa, MD   1 mg at 07/23/16 1016  . buPROPion (WELLBUTRIN XL) 24 hr tablet 150 mg  150 mg Oral Daily Cammy Copa, MD   150 mg at 07/23/16 1016  . docusate sodium (COLACE) capsule 100 mg  100 mg Oral  BID Cammy Copa, MD   100 mg at 07/23/16 1016  . folic acid (FOLVITE) tablet 1 mg  1 mg Oral Daily Cammy Copa, MD   1 mg at 07/23/16 1016  . irbesartan (AVAPRO) tablet 37.5 mg  37.5 mg Oral Daily Cammy Copa, MD   37.5 mg at 07/23/16 1015  . lactated ringers infusion   Intravenous Continuous Gaynelle Adu, MD 10 mL/hr at 07/21/16 1120    . menthol-cetylpyridinium (CEPACOL) lozenge 3 mg  1 lozenge Oral PRN Cammy Copa, MD       Or  . phenol (CHLORASEPTIC) mouth spray 1 spray  1 spray Mouth/Throat PRN Cammy Copa, MD      . metFORMIN (GLUCOPHAGE-XR) 24 hr tablet 500 mg  500 mg Oral Q breakfast Cammy Copa, MD   500 mg at 07/22/16 1115  .  methocarbamol (ROBAXIN) tablet 500 mg  500 mg Oral Q6H PRN Cammy Copa, MD   500 mg at 07/23/16 1531   Or  . methocarbamol (ROBAXIN) 500 mg in dextrose 5 % 50 mL IVPB  500 mg Intravenous Q6H PRN Cammy Copa, MD      . metoCLOPramide (REGLAN) tablet 5-10 mg  5-10 mg Oral Q8H PRN Cammy Copa, MD       Or  . metoCLOPramide (REGLAN) injection 5-10 mg  5-10 mg Intravenous Q8H PRN Cammy Copa, MD      . mirtazapine (REMERON) tablet 15 mg  15 mg Oral QHS Cammy Copa, MD   15 mg at 07/22/16 2218  . morphine 4 MG/ML injection 4 mg  4 mg Intravenous Q4H PRN Cammy Copa, MD   4 mg at 07/23/16 1015  . ondansetron (ZOFRAN) tablet 4 mg  4 mg Oral Q6H PRN Cammy Copa, MD       Or  . ondansetron [facility]) injection 4 mg  4 mg Intravenous Q6H PRN Cammy Copa, MD      . oxyCODONE (Oxy IR/ROXICODONE) immediate release tablet 5-10 mg  5-10 mg Oral Q3H PRN Cammy Copa, MD   10 mg at 07/23/16 1531  . pantoprazole (PROTONIX) EC tablet 40 mg  40 mg Oral BID Cammy Copa, MD   40 mg at 07/23/16 1015  . pindolol (VISKEN) tablet 5 mg  5 mg Oral BID Cammy Copa, MD   5 mg at 07/23/16 1015  . rivaroxaban (XARELTO) tablet 10 mg  10 mg Oral Q breakfast Cammy Copa, MD   10 mg at 07/23/16 0747  . sucralfate (CARAFATE) tablet 1 g  1 g Oral QID Cammy Copa, MD   1 g at 07/23/16 1531     Discharge Medications: Please see discharge summary for a list of discharge medications.  Relevant Imaging Results:  Relevant Lab Results:   Additional Information SSN: [id number] [place], [place]

## 2016-07-24 DIAGNOSIS — F331 Major depressive disorder, recurrent, moderate: Secondary | ICD-10-CM | POA: Diagnosis not present

## 2016-07-24 DIAGNOSIS — F17210 Nicotine dependence, cigarettes, uncomplicated: Secondary | ICD-10-CM | POA: Diagnosis not present

## 2016-07-24 DIAGNOSIS — Z96651 Presence of right artificial knee joint: Secondary | ICD-10-CM | POA: Diagnosis not present

## 2016-07-24 DIAGNOSIS — I10 Essential (primary) hypertension: Secondary | ICD-10-CM | POA: Diagnosis not present

## 2016-07-24 DIAGNOSIS — Z7901 Long term (current) use of anticoagulants: Secondary | ICD-10-CM | POA: Diagnosis not present

## 2016-07-24 DIAGNOSIS — M1711 Unilateral primary osteoarthritis, right knee: Secondary | ICD-10-CM | POA: Diagnosis not present

## 2016-07-24 DIAGNOSIS — D638 Anemia in other chronic diseases classified elsewhere: Secondary | ICD-10-CM | POA: Diagnosis not present

## 2016-07-24 DIAGNOSIS — R2689 Other abnormalities of gait and mobility: Secondary | ICD-10-CM | POA: Diagnosis not present

## 2016-07-24 DIAGNOSIS — Z720 Tobacco use: Secondary | ICD-10-CM | POA: Diagnosis not present

## 2016-07-24 DIAGNOSIS — M6281 Muscle weakness (generalized): Secondary | ICD-10-CM | POA: Diagnosis not present

## 2016-07-24 DIAGNOSIS — Z8659 Personal history of other mental and behavioral disorders: Secondary | ICD-10-CM | POA: Diagnosis not present

## 2016-07-24 DIAGNOSIS — Z9189 Other specified personal risk factors, not elsewhere classified: Secondary | ICD-10-CM | POA: Diagnosis not present

## 2016-07-24 DIAGNOSIS — Z471 Aftercare following joint replacement surgery: Secondary | ICD-10-CM | POA: Diagnosis not present

## 2016-07-24 DIAGNOSIS — R7303 Prediabetes: Secondary | ICD-10-CM | POA: Diagnosis not present

## 2016-07-24 DIAGNOSIS — K219 Gastro-esophageal reflux disease without esophagitis: Secondary | ICD-10-CM | POA: Diagnosis not present

## 2016-07-24 DIAGNOSIS — M1990 Unspecified osteoarthritis, unspecified site: Secondary | ICD-10-CM | POA: Diagnosis not present

## 2016-07-24 DIAGNOSIS — Z8673 Personal history of transient ischemic attack (TIA), and cerebral infarction without residual deficits: Secondary | ICD-10-CM | POA: Diagnosis not present

## 2016-07-24 DIAGNOSIS — Z7984 Long term (current) use of oral hypoglycemic drugs: Secondary | ICD-10-CM | POA: Diagnosis not present

## 2016-07-24 DIAGNOSIS — S8990XA Unspecified injury of unspecified lower leg, initial encounter: Secondary | ICD-10-CM | POA: Diagnosis not present

## 2016-07-24 NOTE — Social Work (Signed)
PASSR #[id number] E obtained for 30 day short term rehab care.  Updated FL2 and resent to facility.

## 2016-07-24 NOTE — Care Management Important Message (Signed)
Important Message  Patient Details  Name: [name] MRN: [id number] Date of Birth: June 16, 1965   Medicare Important Message Given:  Yes    Nathen May 07/24/2016, 2:33 PM

## 2016-07-24 NOTE — Progress Notes (Signed)
Physical Therapy Treatment Patient Details Name: [name] MRN: [id number] DOB: [birth year] Today's Date: 07/24/2016    History of Present Illness Pt is a 51 y.o. male s/p R TKA on 07/21/16. PMH significant for upper GI bleed, benign essential HTN, anemia of chronic disease, cocaine abuse, schizophrenia, GERD, pneumonia, stroke, anxiety, and arthritis.    PT Comments    Pt is progressing his ambulation distance.  He continues to have difficulty getting up from a seated position and has poor endurance requiring multiple seated rest breaks.  The plan is for him to d/c to SNF this PM.  PT to follow acutely until d/c confirmed.     Follow Up Recommendations  SNF     Equipment Recommendations  Rolling walker with 5" wheels;3in1 (PT)    Recommendations for Other Services   NA     Precautions / Restrictions Precautions Precautions: Knee Precaution Booklet Issued: Yes (comment) Precaution Comments: knee exercise handout given and reviewed no pillow under operative knee rule Required Braces or Orthoses:  (did not use this session as he has been going without ) Restrictions Weight Bearing Restrictions: Yes RLE Weight Bearing: Weight bearing as tolerated    Mobility  Bed Mobility Overal bed mobility: Needs Assistance Bed Mobility: Supine to Sit;Sit to Supine     Supine to sit: Supervision;HOB elevated     General bed mobility comments: supervision for safety, pt using bed rail and HOB elevated ~45 degrees  Transfers Overall transfer level: Needs assistance Equipment used: Rolling walker (2 wheeled) Transfers: Sit to/from Stand Sit to Stand: Min assist         General transfer comment: Min assist to get up from bed, bench, and roller chair in the hallway.   Ambulation/Gait Ambulation/Gait assistance: Supervision Ambulation Distance (Feet): 50 Feet (x3 with three seated rest breaks) Assistive device: Rolling walker (2 wheeled) Gait Pattern/deviations: Step-to  pattern;Antalgic Gait velocity: decreased Gait velocity interpretation: Below normal speed for age/gender General Gait Details: Moderately antalgic gait pattern with flexed knee posture.  Verbal cues for safe RW use and better gait pattern.           Balance Overall balance assessment: Needs assistance Sitting-balance support: Feet supported;No upper extremity supported Sitting balance-[name]: Good     Standing balance support: Bilateral upper extremity supported;No upper extremity supported;Single extremity supported Standing balance-[name]: Fair                              Cognition Arousal/Alertness: Awake/alert Behavior During Therapy: WFL for tasks assessed/performed Overall Cognitive Status: History of cognitive impairments - at baseline                                        Exercises Total Joint Exercises Ankle Circles/Pumps: AROM;Both;20 reps Quad Sets: AROM;Right;10 reps Towel Squeeze: AROM;Both;10 reps Heel Slides: AAROM;Right;10 reps Knee Flexion: AAROM;Right;10 reps;Seated Goniometric ROM: 8-70        Pertinent Vitals/Pain Pain Assessment: 0-10 Pain Score: 8  Pain Location: R knee Pain Descriptors / Indicators: Aching;Discomfort;Operative site guarding Pain Intervention(s): Limited activity within patient's tolerance;Monitored during session;Repositioned;Patient requesting pain meds-RN notified           PT Goals (current goals can now be found in the care plan section) Acute Rehab PT Goals Patient Stated Goal: to get his pain better Progress towards PT goals: Progressing toward  goals    Frequency    7X/week      PT Plan Current plan remains appropriate       End of Session   Activity Tolerance: Patient limited by pain Patient left: in chair;with call bell/phone within reach Nurse Communication: Mobility status PT Visit Diagnosis: Muscle weakness (generalized) (M62.81);Difficulty in walking, not  elsewhere classified (R26.2);Pain Pain - Right/Left: Right Pain - part of body: Knee     Time: 7829-5621 PT Time Calculation (min) (ACUTE ONLY): 30 min  Charges:  $Gait Training: 8-22 mins $Therapeutic Exercise: 8-22 mins          Abbeygail Igoe B. Kodie Kishi, PT, DPT [phone]            07/24/2016, 12:24 PM

## 2016-07-24 NOTE — Progress Notes (Signed)
Subjective: Pt stable - pain ok   Objective: Vital signs in last 24 hours: Temp:  [98.2 F (36.8 C)-99.2 F (37.3 C)] 98.2 F (36.8 C) (04/27 0545) Pulse Rate:  [102-108] 108 (04/27 0545) Resp:  [16-17] 17 (04/27 0545) BP: (99-115)/(59-74) 99/59 (04/27 0545) SpO2:  [98 %-100 %] 100 % (04/27 0545)  Intake/Output from previous day: 04/26 0701 - 04/27 0700 In: 480 [P.O.:480] Out: 1950 [Urine:1950] Intake/Output this shift: No intake/output data recorded.  Exam:  Intact pulses distally Dorsiflexion/Plantar flexion intact No cellulitis present Compartment soft  Labs:  Recent Labs  07/23/16 0655  HGB 10.8*    Recent Labs  07/23/16 0655  WBC 10.8*  RBC 3.63*  HCT 31.6*  PLT 289    Recent Labs  07/23/16 0655  NA 131*  K 4.1  CL 95*  CO2 25  BUN <5*  CREATININE 0.96  GLUCOSE 151*  CALCIUM 8.2*   No results for input(s): LABPT, INR in the last 72 hours.  Assessment/Plan: Plan snf today - pain rx on chart - dvt  Prophylaxis and mr sent to [facility] 07/24/2016, 7:48 AM

## 2016-07-24 NOTE — Social Work (Signed)
Clinical Social Worker facilitated patient discharge including contacting patient family and facility to confirm patient discharge plans.  Clinical information faxed to facility and family agreeable with plan.  CSW arranged ambulance transport via [facility] to [place].  RN to call [phone] report prior to discharge. Pt going to Rm 309.  Clinical Social Worker will sign off for now as social work intervention is no longer needed. Please consult [place] again if new need arises.  Elissa Hefty, LCSW Clinical Social Worker [phone]

## 2016-07-24 NOTE — Progress Notes (Signed)
Pt will be discharged from unit today. All belongings with pt. Pt demonstrates no s/sx of distress. Report called to facility per SW note

## 2016-07-24 NOTE — Clinical Social Work Placement (Signed)
CLINICAL SOCIAL WORK PLACEMENT  NOTE  Date:  07/24/2016  Patient Details  Name: [name] MRN: [id number] Date of Birth: [birth year]  Clinical Social Work is seeking post-discharge placement for this patient at the Skilled  Nursing Facility level of care (*CSW will initial, date and re-position this form in  chart as items are completed):  Yes   Patient/family provided with [facility] [place] Clinical Social Work Department's list of facilities offering this level of care within the geographic area requested by the patient (or if unable, by the patient's family).  Yes   Patient/family informed of their freedom to choose among providers that offer the needed level of care, that participate in Medicare, Medicaid or managed care program needed by the patient, have an available bed and are willing to accept the patient.  Yes   Patient/family informed of [facility]'s ownership interest in [facility] and [facility], as well as of the fact that they are under no obligation to receive care at these facilities.  PASRR submitted to EDS on       PASRR number received on 07/23/16     Existing PASRR number confirmed on 07/23/16     FL2 transmitted to all facilities in geographic area requested by pt/family on 07/23/16     FL2 transmitted to all facilities within larger geographic area on 07/23/16     Patient informed that his/her managed care company has contracts with or will negotiate with certain facilities, including the following:        Yes   Patient/family informed of bed offers received.  Patient chooses bed at [facility]     Physician recommends and patient chooses bed at      Patient to be transferred to [facility] on 07/24/16.  Patient to be transferred to facility by [facility]     Patient family notified on 07/24/16 of transfer.  Name of family member notified:  Okey Regal      PHYSICIAN Please prepare priority discharge summary, including  medications, Please prepare prescriptions, Please sign FL2     Additional Comment:    _______________________________________________ Tresa Moore, LCSW 07/24/2016, 10:45 AM

## 2016-07-24 NOTE — Progress Notes (Signed)
Orthopedic Tech Progress Note Patient Details:  [name] [birth year] [id number]  Patient ID: [name], male   DOB: 08-08-1965, 51 y.o.   MRN: [id number] Pt will call when ready  Karolee Stamps 07/24/2016, 6:14 AM

## 2016-07-24 NOTE — [place] FL2 (Signed)
[place] MEDICAID FL2 LEVEL OF CARE SCREENING TOOL     IDENTIFICATION  Patient Name: [name] Birthdate: 1965-04-26 Sex: male Admission Date (Current Location): 07/21/2016  [facility] and [place] Number:  Producer, television/film/video and Address:  [facility], [address], [place], [place] [address]      Provider Number: [id number]  Attending Physician Name and Address:  Cammy Copa, MD  Relative Name and Phone Number:       Current Level of Care: Hospital Recommended Level of Care: Skilled Nursing Facility Prior Approval Number:    Date Approved/Denied:   PASRR Number:   [id number]  Discharge Plan: SNF    Current Diagnoses: Patient Active Problem List   Diagnosis Date Noted  . Arthritis of knee 07/21/2016  . Primary osteoarthritis of right knee 05/27/2016  . Upper GI bleed 02/18/2015  . Benign essential HTN 02/18/2015  . Anemia of chronic disease 02/18/2015  . Acute alcohol intoxication (HCC) 02/18/2015  . Pulmonary nodule 02/18/2015  . Metabolic acidosis 02/18/2015  . Alcohol dependence with intoxication with complication (HCC)   . Major depressive disorder, recurrent episode, moderate (HCC)   . Cocaine abuse 03/02/2014  . History of schizophrenia 03/01/2014  . Tobacco abuse 12/06/2013  . Hematemesis 12/04/2013    Orientation RESPIRATION BLADDER Height & Weight     Self, Time, Situation, Place  Normal Continent Weight: 220 lb 7.4 oz (100 kg) Height:  5\' 9"  (175.3 cm)  BEHAVIORAL SYMPTOMS/MOOD NEUROLOGICAL BOWEL NUTRITION STATUS      Continent Diet (Please see DC Summary)  AMBULATORY STATUS COMMUNICATION OF NEEDS Skin   Limited Assist Verbally Surgical wounds (Closed incision on leg)                       Personal Care Assistance Level of Assistance  Bathing, Feeding, Dressing Bathing Assistance: Limited assistance Feeding assistance: Independent Dressing Assistance: Limited assistance     Functional Limitations  Info  Sight, Speech, Hearing Sight Info: Adequate Hearing Info: Adequate Speech Info: Adequate    SPECIAL CARE FACTORS FREQUENCY  PT (By licensed PT), OT (By licensed OT)     PT Frequency: 5x/week OT Frequency: 3x/week            Contractures      Additional Factors Info  Code Status, Allergies, Psychotropic Code Status Info: Full Allergies Info: Aspirin Psychotropic Info: Wellbutrin         Current Medications (07/24/2016):  This is the current hospital active medication list Current Facility-Administered Medications  Medication Dose Route Frequency Provider Last Rate Last Dose  . acetaminophen (TYLENOL) tablet 650 mg  650 mg Oral Q6H PRN Cammy Copa, MD   650 mg at 07/24/16 0545   Or  . acetaminophen (TYLENOL) suppository 650 mg  650 mg Rectal Q6H PRN Cammy Copa, MD      . albuterol (PROVENTIL) (2.5 MG/3ML) 0.083% nebulizer solution 2.5 mg  2.5 mg Nebulization BID PRN Cammy Copa, MD      . amLODipine (NORVASC) tablet 2.5 mg  2.5 mg Oral Daily Cammy Copa, MD   2.5 mg at 07/23/16 1016  . benztropine (COGENTIN) tablet 1 mg  1 mg Oral Daily Cammy Copa, MD   1 mg at 07/23/16 1016  . buPROPion (WELLBUTRIN XL) 24 hr tablet 150 mg  150 mg Oral Daily Cammy Copa, MD   150 mg at 07/23/16 1016  . docusate sodium (COLACE) capsule 100  mg  100 mg Oral BID Cammy Copa, MD   100 mg at 07/23/16 2151  . folic acid (FOLVITE) tablet 1 mg  1 mg Oral Daily Cammy Copa, MD   1 mg at 07/23/16 1016  . irbesartan (AVAPRO) tablet 37.5 mg  37.5 mg Oral Daily Cammy Copa, MD   37.5 mg at 07/23/16 1015  . lactated ringers infusion   Intravenous Continuous Gaynelle Adu, MD 10 mL/hr at 07/21/16 1120    . menthol-cetylpyridinium (CEPACOL) lozenge 3 mg  1 lozenge Oral PRN Cammy Copa, MD       Or  . phenol (CHLORASEPTIC) mouth spray 1 spray  1 spray Mouth/Throat PRN Cammy Copa, MD      . metFORMIN (GLUCOPHAGE-XR) 24  hr tablet 500 mg  500 mg Oral Q breakfast Cammy Copa, MD   500 mg at 07/22/16 1115  . methocarbamol (ROBAXIN) tablet 500 mg  500 mg Oral Q6H PRN Cammy Copa, MD   500 mg at 07/23/16 2150   Or  . methocarbamol (ROBAXIN) 500 mg in dextrose 5 % 50 mL IVPB  500 mg Intravenous Q6H PRN Cammy Copa, MD      . metoCLOPramide (REGLAN) tablet 5-10 mg  5-10 mg Oral Q8H PRN Cammy Copa, MD       Or  . metoCLOPramide (REGLAN) injection 5-10 mg  5-10 mg Intravenous Q8H PRN Cammy Copa, MD      . mirtazapine (REMERON) tablet 15 mg  15 mg Oral QHS Cammy Copa, MD   15 mg at 07/23/16 2152  . morphine 4 MG/ML injection 4 mg  4 mg Intravenous Q4H PRN Cammy Copa, MD   4 mg at 07/24/16 0546  . ondansetron (ZOFRAN) tablet 4 mg  4 mg Oral Q6H PRN Cammy Copa, MD       Or  . ondansetron [facility]) injection 4 mg  4 mg Intravenous Q6H PRN Cammy Copa, MD      . oxyCODONE (Oxy IR/ROXICODONE) immediate release tablet 5-10 mg  5-10 mg Oral Q3H PRN Cammy Copa, MD   10 mg at 07/24/16 1610  . pantoprazole (PROTONIX) EC tablet 40 mg  40 mg Oral BID Cammy Copa, MD   40 mg at 07/23/16 2150  . pindolol (VISKEN) tablet 5 mg  5 mg Oral BID Cammy Copa, MD   5 mg at 07/23/16 2152  . rivaroxaban (XARELTO) tablet 10 mg  10 mg Oral Q breakfast Cammy Copa, MD   10 mg at 07/23/16 0747  . sucralfate (CARAFATE) tablet 1 g  1 g Oral QID Cammy Copa, MD   1 g at 07/23/16 2151     Discharge Medications: Please see discharge summary for a list of discharge medications.  Relevant Imaging Results:  Relevant Lab Results:   Additional Information SSN: [id number] [phone]; [id number]  Tresa Moore, LCSW

## 2016-07-25 LAB — CBC
HEMATOCRIT: 33 %
HEMOGLOBIN: 11 g/dL
PLATELETS: 391
RBC: 3.74
WBC: 10.4

## 2016-07-25 LAB — BASIC METABOLIC PANEL
BUN: 7 mg/dL (ref 4–21)
Creatinine: 0.8 mg/dL (ref 0.6–1.3)
GLUCOSE: 125 mg/dL
Potassium: 4.9 mmol/L (ref 3.4–5.3)
Sodium: 135 mmol/L — AB (ref 137–147)

## 2016-07-27 ENCOUNTER — Other Ambulatory Visit: Payer: Self-pay

## 2016-07-27 MED ORDER — OXYCODONE HCL 5 MG PO TABS: 5.0000 mg | ORAL_TABLET | ORAL | 0 refills | Status: DC | PRN

## 2016-07-27 NOTE — Telephone Encounter (Signed)
Faxed to [facility] Fax Number: [phone], Phone Number [phone]  

## 2016-07-28 ENCOUNTER — Encounter: Payer: Self-pay | Admitting: Internal Medicine

## 2016-07-28 ENCOUNTER — Non-Acute Institutional Stay (SKILLED_NURSING_FACILITY): Payer: Medicare Other | Admitting: Internal Medicine

## 2016-07-28 DIAGNOSIS — Z720 Tobacco use: Secondary | ICD-10-CM | POA: Diagnosis not present

## 2016-07-28 DIAGNOSIS — M1711 Unilateral primary osteoarthritis, right knee: Secondary | ICD-10-CM

## 2016-07-28 DIAGNOSIS — Z9189 Other specified personal risk factors, not elsewhere classified: Secondary | ICD-10-CM

## 2016-07-28 NOTE — Patient Instructions (Addendum)
See assessment and plan under each diagnosis in the problem list and acutely for this visit Total time 49 minutes; greater than 50% of the visit spent counseling patient about adverse drug risks and coordinating care for problems addressed at this encounter

## 2016-07-28 NOTE — Progress Notes (Signed)
Facility Location: Room Number:  Code Status:   Bartholome Bill, MD  [address] [address]  This is a comprehensive admission note to [facility] performed on this date less than 30 days from date of admission. Included are preadmission medical/surgical history;reconciled medication list; family history; social history and comprehensive review of systems.  Corrections and additions to the records were documented . Comprehensive physical exam was also performed. Additionally a clinical summary was entered for each active diagnosis pertinent to this admission in the Problem List to enhance continuity of care.  HPI: The patient was hospitalized 4/24-4/27/18 for elective R  total knee arthroplasty on 07/21/16 by Dr. [name]. He states that initially the surgeon did not want to pursue the surgery because of his age; but due to intractable pain surgery was scheduled. Past history includes intra-articular steroids as well as TENS. There were no perioperative complications and the patient was ambulatory with physical therapy postop day 1. He did have residual weakness and transfer to SNF for PT/OT was recommended. He was discharged on Xarelto for DVT prophylaxis.  4/28 hemoglobin was  11, hematocrit 33, sodium 135. Glucose was 125, but A1c had been prediabetic at 6% on 07/10/16.   Patient was be seen in follow-up approximate 07/31/08 for postop check by Dr [name]  Past medical and surgical history: Includes stroke, schizophrenia, GERD, asthma, emphysema,anxiety. He has had upper Endo 2. He states that he had 2 strokes within the span of 12 months. He is on antihypertensives as prophylaxis for CVA rather than hypertension treatment. He states that he was at [facility] for schizophrenia in 1989. He receives a "shot" every 3 months through the [facility]  Social history: He quit drinking in 2015; but he has history of drinking up to two  fifths/day. He has smoked since age 13, up to 1.5 packs per day. He denies drug abuse but the record states he has abused cocaine.   Family history: He is adopted. He knows he is "full blooded [place]" and [place].  Review of systems: His major complaints at this time is nicotine withdrawal and lack of pain relief with 5 mg of OxyContin every 4 hours. He states he was getting 10 mg every 4 hours in the hospital "plus morphine".  Constitutional: No fever,significant weight change, fatigue  Eyes: No redness, discharge, pain, vision change ENT/mouth: No nasal congestion,  purulent discharge, earache,change in hearing ,sore throat  Cardiovascular: No chest pain, palpitations,paroxysmal nocturnal dyspnea, claudication, edema  Respiratory: No cough, sputum production,hemoptysis, DOE , significant snoring,apnea  Gastrointestinal: No heartburn,dysphagia,abdominal pain, nausea / vomiting,rectal bleeding, melena,change in bowels Genitourinary: No dysuria,hematuria, pyuria,  incontinence, nocturia Dermatologic: No rash, pruritus, change in appearance of skin Neurologic: No dizziness,headache,syncope, seizures, numbness , tingling Endocrine: No change in hair/skin/ nails, excessive thirst, excessive hunger, excessive urination  Hematologic/lymphatic: No significant bruising, lymphadenopathy,abnormal bleeding Allergy/immunology: No itchy/ watery eyes, significant sneezing, urticaria, angioedema  Physical exam:  Pertinent or positive findings: skin is deeply pigmented as would be expected for an  [place]. Hair is disheveled &  long. Dentition is poor. S4 is noted. Breath sounds are decreased. Abdomen is protuberant. He has an area of chronic hyperpigmentation at the left medial/posterior ankle. There is a bruise over the right thigh. The wound over the right knee is dressed. The fourth right fingernail is deformed. He has multiple scattered tattoos.  General  appearance:Adequately nourished; no acute distress ,  increased work of breathing is present.   Lymphatic: No lymphadenopathy about the head, neck, axilla . Eyes: No conjunctival inflammation or lid edema is present. There is no scleral icterus. Ears:  External ear exam shows no significant lesions or deformities.   Nose:  External nasal examination shows no deformity or inflammation. Nasal mucosa are pink and moist without lesions ,exudates Oral exam: lips and gums are healthy appearing.There is no oropharyngeal erythema or exudate . Neck:  No thyromegaly, masses, tenderness noted.    Heart:  Normal rate and regular rhythm without gallop, murmur, click, rub .  Lungs: without wheezes, rhonchi,rales , rubs. Abdomen:Bowel sounds are normal. Abdomen is soft and nontender with no organomegaly, hernias,masses. GU: deferred  Extremities:  No cyanosis, clubbing,edema  Neurologic exam : Strength equal  in upper extremities Balance,Rhomberg,finger to nose testing could not be completed due to clinical state Deep tendon reflexes are equal Skin: Warm & dry w/o tenting. No significant lesions or rash.  See clinical summary under each active problem in the Problem List with associated updated therapeutic plan

## 2016-07-28 NOTE — Assessment & Plan Note (Signed)
PT/OT Tramadol for breakthrough pain

## 2016-07-28 NOTE — Assessment & Plan Note (Addendum)
Notify Dr [name] & PCP  of above No increase in oxycodone Add break through Tramadol X 3 days NSAIDS not option on Xarelto Patient informed of risk

## 2016-07-28 NOTE — Assessment & Plan Note (Signed)
07/28/16 1.5 packs per day, past history of CVA 2 Risk discussed Nicotine patch

## 2016-07-29 ENCOUNTER — Encounter: Payer: Self-pay | Admitting: Internal Medicine

## 2016-07-30 ENCOUNTER — Inpatient Hospital Stay (INDEPENDENT_AMBULATORY_CARE_PROVIDER_SITE_OTHER): Payer: Medicare Other | Admitting: Orthopedic Surgery

## 2016-08-03 ENCOUNTER — Ambulatory Visit (INDEPENDENT_AMBULATORY_CARE_PROVIDER_SITE_OTHER): Payer: PRIVATE HEALTH INSURANCE

## 2016-08-03 ENCOUNTER — Encounter (INDEPENDENT_AMBULATORY_CARE_PROVIDER_SITE_OTHER): Payer: Self-pay

## 2016-08-03 ENCOUNTER — Telehealth (INDEPENDENT_AMBULATORY_CARE_PROVIDER_SITE_OTHER): Payer: Self-pay | Admitting: Orthopedic Surgery

## 2016-08-03 ENCOUNTER — Ambulatory Visit (INDEPENDENT_AMBULATORY_CARE_PROVIDER_SITE_OTHER): Payer: Medicare Other | Admitting: Orthopedic Surgery

## 2016-08-03 ENCOUNTER — Encounter (INDEPENDENT_AMBULATORY_CARE_PROVIDER_SITE_OTHER): Payer: Self-pay | Admitting: Orthopedic Surgery

## 2016-08-03 DIAGNOSIS — Z96651 Presence of right artificial knee joint: Secondary | ICD-10-CM | POA: Diagnosis not present

## 2016-08-03 NOTE — Telephone Encounter (Signed)
Patients Fiance called and was wanting to know if you could speak with someone at the facility that the patient is currently at. [facility]) She states that his current doctor there isn't aware of how severe his pain is. He's also requesting a hospital bed. [facility] # [phone]

## 2016-08-03 NOTE — Progress Notes (Signed)
Post-Op Visit Note   Patient: [name]           Date of Birth: 18-Sep-1965           MRN: [id number] Visit Date: 08/03/2016 PCP: [name], MD   Assessment & Plan:  Chief Complaint:  Chief Complaint  Patient presents with  . Right Knee - Routine Post Op   Visit Diagnoses:  1. Status post right knee replacement     Plan: [name] is now 2 weeks out right total knee replacement.  He's been doing well.  On exam he is lacking less than 5 from full extension and has 90 of flexion.  He is walking heel to toe with a walker and can do a straight leg raise.  Incision is intact.  Radiographs look good.  Plan at this time is to continue with weightbearing as tolerated on that right lower extremity.  These continue to work on achieving strength as well as more full range of motion of the knee.  For 2 weeks out though he looks great.  Follow-up in 4 weeks  Follow-Up Instructions: No Follow-up on file.   Orders:  Orders Placed This Encounter  Procedures  . XR Knee 1-2 Views Right   No orders of the defined types were placed in this encounter.   Imaging: Xr Knee 1-2 Views Right  Result Date: 08/03/2016 AP lateral right knee reviewed.  Press-fit total knee prosthesis in position.  Coronal sagittal alignment looks good.  No acute fracture or abnormality noted.   PMFS History: Patient Active Problem List   Diagnosis Date Noted  . At risk for adverse drug event 07/28/2016  . Primary osteoarthritis of right knee 05/27/2016  . Upper GI bleed 02/18/2015  . Benign essential HTN 02/18/2015  . Anemia of chronic disease 02/18/2015  . Acute alcohol intoxication (HCC) 02/18/2015  . Pulmonary nodule 02/18/2015  . Metabolic acidosis 02/18/2015  . Alcohol dependence with intoxication with complication (HCC)   . Major depressive disorder, recurrent episode, moderate (HCC)   . Cocaine abuse 03/02/2014  . History of schizophrenia 03/01/2014  . Tobacco abuse 12/06/2013  .  Hematemesis 12/04/2013   Past Medical History:  Diagnosis Date  . Anxiety   . Arthritis   . Asthma   . GERD (gastroesophageal reflux disease)   . Pneumonia    hx  . Pre-diabetes   . Schizophrenia (HCC)   . Stroke [facility])    2009-or10    Family History  Problem Relation Age of Onset  . Adopted: Yes    Past Surgical History:  Procedure Laterality Date  . ESOPHAGOGASTRODUODENOSCOPY Left 12/05/2013   Procedure: ESOPHAGOGASTRODUODENOSCOPY (EGD);  Surgeon: Willis Modena, MD;  Location: [facility];  Service: Endoscopy;  Laterality: Left;  . ESOPHAGOGASTRODUODENOSCOPY (EGD) WITH PROPOFOL N/A 02/20/2015   Procedure: ESOPHAGOGASTRODUODENOSCOPY (EGD) WITH PROPOFOL;  Surgeon: Charlott Rakes, MD;  Location: [facility] END[facility]COPY;  Service: Endoscopy;  Laterality: N/A;  . TOTAL KNEE ARTHROPLASTY Right 07/21/2016   Procedure: TOTAL KNEE ARTHROPLASTY;  Surgeon: Cammy Copa, MD;  Location: [facility] OR;  Service: Orthopedics;  Laterality: Right;   Social History   Occupational History  . Not on file.   Social History Main Topics  . Smoking status: Heavy Tobacco Smoker    Packs/day: 0.50    Years: 13.00    Types: Cigarettes  . Smokeless tobacco: Never Used     Comment: onset age 8; upto 1.5 ppd  . Alcohol use No     Comment: quit  2015; formerly up to 2 fifths/day  . Drug use: No  . Sexual activity: Yes

## 2016-08-03 NOTE — Telephone Encounter (Signed)
IC discussed that since patient is staying at nursing facility all orders or equipment that they feel like he is in need of will be written for him upon his d/c from facility

## 2016-08-07 ENCOUNTER — Non-Acute Institutional Stay (SKILLED_NURSING_FACILITY): Payer: Medicare Other | Admitting: Nurse Practitioner

## 2016-08-07 ENCOUNTER — Encounter: Payer: Self-pay | Admitting: Nurse Practitioner

## 2016-08-07 DIAGNOSIS — Z8659 Personal history of other mental and behavioral disorders: Secondary | ICD-10-CM | POA: Diagnosis not present

## 2016-08-07 DIAGNOSIS — I10 Essential (primary) hypertension: Secondary | ICD-10-CM | POA: Diagnosis not present

## 2016-08-07 DIAGNOSIS — F331 Major depressive disorder, recurrent, moderate: Secondary | ICD-10-CM | POA: Diagnosis not present

## 2016-08-07 DIAGNOSIS — M1711 Unilateral primary osteoarthritis, right knee: Secondary | ICD-10-CM

## 2016-08-07 DIAGNOSIS — Z720 Tobacco use: Secondary | ICD-10-CM | POA: Diagnosis not present

## 2016-08-07 DIAGNOSIS — K219 Gastro-esophageal reflux disease without esophagitis: Secondary | ICD-10-CM

## 2016-08-07 MED ORDER — VALSARTAN 160 MG PO TABS
160.0000 mg | ORAL_TABLET | Freq: Every day | ORAL | 0 refills | Status: DC
Start: 2016-08-07 — End: 2016-11-05

## 2016-08-07 MED ORDER — METHOCARBAMOL 500 MG PO TABS: 500.0000 mg | ORAL_TABLET | Freq: Four times a day (QID) | ORAL | 0 refills | Status: DC | PRN

## 2016-08-07 MED ORDER — MIRTAZAPINE 15 MG PO TABS: 15.0000 mg | ORAL_TABLET | Freq: Every day | ORAL | 0 refills | Status: DC

## 2016-08-07 MED ORDER — RIVAROXABAN 10 MG PO TABS: 10.0000 mg | ORAL_TABLET | Freq: Every day | ORAL | 0 refills | Status: DC

## 2016-08-07 MED ORDER — SUCRALFATE 1 G PO TABS: 1.0000 g | ORAL_TABLET | Freq: Four times a day (QID) | ORAL | 0 refills | Status: DC

## 2016-08-07 MED ORDER — AMLODIPINE BESYLATE 2.5 MG PO TABS: 2.5000 mg | ORAL_TABLET | Freq: Every day | ORAL | 0 refills | Status: DC

## 2016-08-07 MED ORDER — BENZTROPINE MESYLATE 1 MG PO TABS: 1.0000 mg | ORAL_TABLET | Freq: Every day | ORAL | 0 refills | Status: DC

## 2016-08-07 MED ORDER — BUPROPION HCL ER (XL) 150 MG PO TB24: 150.0000 mg | ORAL_TABLET | Freq: Every day | ORAL | 0 refills | Status: DC

## 2016-08-07 MED ORDER — PINDOLOL 5 MG PO TABS: 5.0000 mg | ORAL_TABLET | Freq: Two times a day (BID) | ORAL | 0 refills | Status: DC

## 2016-08-07 MED ORDER — METFORMIN HCL ER 500 MG PO TB24: 500.0000 mg | ORAL_TABLET | Freq: Every day | ORAL | 0 refills | Status: DC

## 2016-08-07 MED ORDER — PANTOPRAZOLE SODIUM 40 MG PO TBEC: 40.0000 mg | DELAYED_RELEASE_TABLET | Freq: Two times a day (BID) | ORAL | 0 refills | Status: DC

## 2016-08-07 NOTE — Progress Notes (Signed)
Nursing Home Location:  [facility] Room: [place] of Service: SNF (31)  PCP: Bartholome Bill, MD  Allergies  Allergen Reactions  . Aspirin Swelling    Throat swelling     Chief Complaint  Patient presents with  . Discharge Note    HPI:  Patient is a 51 y.o. male seen today at [facility] for discharge home. Pt with hx of OA right knee s/p total knee replacement, blood loss anemia, ETOH abuse, cocaine abuse, schizophrenia, hyperlipidemia and hypertension. Pt at [facility] post op after right total knee replacement.  He has followed up with orthopedics and incision, follow up xray, and ROM are good.  Pt reports he continues to be in the same amount of pain as he was pre-op and pain has not improved since surgery. Did not want to take tramadol as it was not helping. Pt denies any constipation or side effects from medication. Of note pt girlfriend told facility staff she had plenty of oxycodone at home and knew how pain medication worked and would be giving him medication once he was at home.  Nicotine patch was stopped due to pt ambulating to the street from facility (~100 yards) and smoking. Walking without limp or signs of pain.  Review of Systems:  Review of Systems  Constitutional: Negative for activity change, appetite change, fatigue and unexpected weight change.  HENT: Negative for congestion and hearing loss.   Eyes: Negative.   Respiratory: Negative for cough and shortness of breath.   Cardiovascular: Negative for chest pain, palpitations and leg swelling.  Gastrointestinal: Negative for abdominal pain, constipation and diarrhea.  Genitourinary: Negative for difficulty urinating and dysuria.  Musculoskeletal: Positive for arthralgias and myalgias.  Skin: Negative for color change and wound.  Neurological: Negative for dizziness and weakness.  Psychiatric/Behavioral: Negative for agitation, behavioral problems and confusion.    Past  Medical History:  Diagnosis Date  . Anxiety   . Arthritis   . Asthma   . GERD (gastroesophageal reflux disease)   . Pneumonia    hx  . Pre-diabetes   . Schizophrenia ([place])   . Stroke [facility])    2009-or10   Past Surgical History:  Procedure Laterality Date  . ESOPHAGOGASTRODUODENOSCOPY Left 12/05/2013   Procedure: ESOPHAGOGASTRODUODENOSCOPY (EGD);  Surgeon: Arta Silence, MD;  Location: [facility];  Service: Endoscopy;  Laterality: Left;  . ESOPHAGOGASTRODUODENOSCOPY (EGD) WITH PROPOFOL N/A 02/20/2015   Procedure: ESOPHAGOGASTRODUODENOSCOPY (EGD) WITH PROPOFOL;  Surgeon: Wilford Corner, MD;  Location: [facility];  Service: Endoscopy;  Laterality: N/A;  . TOTAL KNEE ARTHROPLASTY Right 07/21/2016   Procedure: TOTAL KNEE ARTHROPLASTY;  Surgeon: Meredith Pel, MD;  Location: [place];  Service: Orthopedics;  Laterality: Right;   Social History:   reports that he has been smoking Cigarettes.  He has a 6.50 pack-year smoking history. He has never used smokeless tobacco. He reports that he does not drink alcohol or use drugs.  Family History  Problem Relation Age of Onset  . Adopted: Yes    Medications: Patient's Medications  New Prescriptions   No medications on file  Previous Medications   ALBUTEROL (PROVENTIL HFA;VENTOLIN HFA) 108 (90 BASE) MCG/ACT INHALER    Inhale 1 puff into the lungs 2 (two) times daily as needed for wheezing or shortness of breath.   AMLODIPINE (NORVASC) 2.5 MG TABLET    Take 2.5 mg by mouth daily.    BENZTROPINE (COGENTIN) 1 MG TABLET    Take 1 mg by mouth  daily.   BUPROPION (WELLBUTRIN XL) 150 MG 24 HR TABLET    Take 150 mg by mouth daily.   DOCUSATE SODIUM (COLACE) 100 MG CAPSULE    Take 1 capsule (100 mg total) by mouth 2 (two) times daily.   FOLIC ACID (FOLVITE) 1 MG TABLET    Take 1 tablet (1 mg total) by mouth daily. For low folate   METFORMIN (GLUCOPHAGE-XR) 500 MG 24 HR TABLET    Take 500 mg by mouth daily with breakfast. D/c'd >yr  a1c ok    METHOCARBAMOL (ROBAXIN) 500 MG TABLET    Take 1 tablet (500 mg total) by mouth every 6 (six) hours as needed for muscle spasms.   MIRTAZAPINE (REMERON) 15 MG TABLET    Take 15 mg by mouth at bedtime.   OXYCODONE (OXY IR/ROXICODONE) 5 MG IMMEDIATE RELEASE TABLET    Take 5 mg by mouth every 4 (four) hours as needed for severe pain.   PALIPERIDONE PALMITATE (INVEGA TRINZA) 819 MG/2.625ML SUSP    Inject into the muscle every 3 (three) months. Next dose due May 4    PANTOPRAZOLE (PROTONIX) 40 MG TABLET    Take 40 mg by mouth 2 (two) times daily.    PINDOLOL (VISKEN) 5 MG TABLET    Take 1 tablet (5 mg total) by mouth 2 (two) times daily. For high blood pressure   RIVAROXABAN (XARELTO) 10 MG TABS TABLET    Take 1 tablet (10 mg total) by mouth daily with breakfast.   SUCRALFATE (CARAFATE) 1 G TABLET    Take 1 tablet (1 g total) by mouth 4 (four) times daily. For acid reflux   VALSARTAN (DIOVAN) 160 MG TABLET    Take 160 mg by mouth daily.   Modified Medications   No medications on file  Discontinued Medications   NICOTINE (NICODERM CQ - DOSED IN MG/24 HOURS) 21 MG/24HR PATCH    Place 21 mg onto the skin daily.   TRAMADOL (ULTRAM) 50 MG TABLET    Take by mouth every 6 (six) hours as needed (Breakthrough pain).     Physical Exam: Vitals:   08/07/16 1009  BP: (!) 101/59  Pulse: 99  Resp: (!) 22  Temp: 97.9 F (36.6 C)  TempSrc: Oral  SpO2: 96%  Weight: 220 lb (99.8 kg)  Height: 5\' 8"  (1.727 m)    Physical Exam  Constitutional: He is oriented to person, place, and time. He appears well-developed and well-nourished. No distress.  HENT:  Head: Normocephalic and atraumatic.  Mouth/Throat: Oropharynx is clear and moist. No oropharyngeal exudate.  Eyes: Conjunctivae and EOM are normal. Pupils are equal, round, and reactive to light.  Neck: Normal range of motion. Neck supple.  Cardiovascular: Normal rate, regular rhythm and normal heart sounds.   Pulmonary/Chest: Effort normal and breath sounds  normal.  Abdominal: Soft. Bowel sounds are normal.  Musculoskeletal: He exhibits no edema or tenderness.  Neurological: He is alert and oriented to person, place, and time.  Skin: Skin is warm and dry. He is not diaphoretic.  Right knee incision, no drainage, warmth, or change in color.   Psychiatric: He has a normal mood and affect.    Labs reviewed: Basic Metabolic Panel:  Recent Labs  04/13/16 1648 07/10/16 0956 07/23/16 0655 07/25/16  NA 132* 134* 131* 135*  K 4.6 4.2 4.1 4.9  CL 96 105 95*  --   CO2 21 19* 25  --   GLUCOSE 107* 114* 151*  --   BUN 5*  7 <5* 7  CREATININE 0.86 1.01 0.96 0.8  CALCIUM 9.1 9.0 8.2*  --    Liver Function Tests:  Recent Labs  04/10/16 1648 04/13/16 1648 07/10/16 0956  AST CANCELED 19 21  ALT CANCELED 6 9*  ALKPHOS CANCELED 129* 109  BILITOT CANCELED <0.2 0.4  PROT CANCELED 7.0 6.6  ALBUMIN CANCELED 4.2 3.9   No results for input(s): LIPASE, AMYLASE in the last 8760 hours. No results for input(s): AMMONIA in the last 8760 hours. CBC:  Recent Labs  04/10/16 1659 07/10/16 0956 07/23/16 0655 07/25/16  WBC 10.0 10.1 10.8* 10.4  HGB 13.9* 13.1 10.8* 11.0  HCT 39.2* 38.9* 31.6* 33  MCV 87.8 88.0 87.1  --   PLT  --  318 289 391   TSH: No results for input(s): TSH in the last 8760 hours. A1C: Lab Results  Component Value Date   HGBA1C 6.0 (H) 07/10/2016   Lipid Panel: No results for input(s): CHOL, HDL, LDLCALC, TRIG, CHOLHDL, LDLDIRECT in the last 8760 hours.  Radiological Exams: Dg Chest 2 View  Result Date: 07/10/2016 CLINICAL DATA:  Preoperative knee replacement.  Asthma. EXAM: CHEST  2 VIEW COMPARISON:  May 28, 2015 FINDINGS: Lungs are clear. Heart size and pulmonary vascularity are normal. No adenopathy. No bone lesions. IMPRESSION: No edema or consolidation. Electronically Signed   By: Lowella Grip III M.D.   On: 07/10/2016 12:54    Assessment/Plan 1. Primary osteoarthritis of right knee s/p total knee  replacement, reports he is in a lot of pain and needing more pain relief however pt is doing very well in rehab and walking from facility.  -pt is at 80% risk for adverse drug reaction and discussed weaning medication. Will reduce oxycodone to q 6 hours PRN, pt reports he would not be needing medication on discharge because he could not get it from pharmacy. - methocarbamol (ROBAXIN) 500 MG tablet; Take 1 tablet (500 mg total) by mouth every 6 (six) hours as needed for muscle spasms.  Dispense: 30 tablet; Refill: 0 - rivaroxaban (XARELTO) 10 MG TABS tablet; Take 1 tablet (10 mg total) by mouth daily with breakfast.  Dispense: 30 tablet; Refill: 0  2. Tobacco abuse nicotine patches were attempted but he cont to smoke.   3. Benign essential HTN Stable, will cont outpt regimen.  - amLODipine (NORVASC) 2.5 MG tablet; Take 1 tablet (2.5 mg total) by mouth daily.  Dispense: 30 tablet; Refill: 0 - pindolol (VISKEN) 5 MG tablet; Take 1 tablet (5 mg total) by mouth 2 (two) times daily. For high blood pressure  Dispense: 60 tablet; Refill: 0 - valsartan (DIOVAN) 160 MG tablet; Take 1 tablet (160 mg total) by mouth daily.  Dispense: 30 tablet; Refill: 0  4. Major depressive disorder, recurrent episode, moderate (HCC) -stable, will cont outpt regimen - buPROPion (WELLBUTRIN XL) 150 MG 24 hr tablet; Take 1 tablet (150 mg total) by mouth daily.  Dispense: 30 tablet; Refill: 0 - mirtazapine (REMERON) 15 MG tablet; Take 1 tablet (15 mg total) by mouth at bedtime.  Dispense: 30 tablet; Refill: 0  5. Gastroesophageal reflux disease, esophagitis presence not specified - stable, cont home regimen -sucralfate (CARAFATE) 1 g tablet; Take 1 tablet (1 g total) by mouth 4 (four) times daily. For acid reflux  Dispense: 120 tablet; Refill: 0 -prontoprazole (PROTONIX) 40 MG tablet; Take 1 tablet (40 mg total) by mouth 2 (two) times daily.  Dispense: 60 tablet; Refill: 0  6. History of schizophrenia - benztropine  (  COGENTIN) 1 MG tablet; Take 1 tablet (1 mg total) by mouth daily.  Dispense: 30 tablet; Refill: 0  pt is stable for discharge-will need PT/OT/sw per home health. DME needed includes rolling walker. Rx sent via epic for 1 month supply, narcotics were not given.  will need to follow up with PCP within 2 weeks.      Carlos American. Harle Battiest  [facility] [phone] 8 am - 5 pm) [phone] (after hours)

## 2016-08-11 ENCOUNTER — Non-Acute Institutional Stay (SKILLED_NURSING_FACILITY): Payer: Medicare Other | Admitting: Internal Medicine

## 2016-08-11 ENCOUNTER — Encounter: Payer: Self-pay | Admitting: Internal Medicine

## 2016-08-11 DIAGNOSIS — Z720 Tobacco use: Secondary | ICD-10-CM

## 2016-08-11 DIAGNOSIS — Z9189 Other specified personal risk factors, not elsewhere classified: Secondary | ICD-10-CM

## 2016-08-11 DIAGNOSIS — M1711 Unilateral primary osteoarthritis, right knee: Secondary | ICD-10-CM | POA: Diagnosis not present

## 2016-08-11 NOTE — Progress Notes (Signed)
   This is a nursing facility follow up for specific acute issue of swelling R knee.  Interim medical record and care since last [place] visit was updated with review of diagnostic studies and change in clinical status since last visit were documented.  HPI: Staff reports that the patient complained of swelling of the R knee with associated pain which is persisting. He also describes drainage from one of the suture sites of the right knee "like a pimple". He describes being "hot and cold" at night without other constitutional or infectious symptoms. The patient was last seen by Dr. [name] 5/3 and felt to be stable. At the SNF he has been participating in PT/OT and ambulating employing his walker 175 feet each way to smoke on the corner of [employer] up to 3 X/ day. This does involve 15% grade returning to the SNF. Smoking risk has been discussed with the patient and patch was prescribed. He has discontinued the patch and repeatedly signed himself out to smoke. He has no interest in stopping smoking. He is on Xarelto for DVT prophylaxis, this precludes the use of nonsteroidals. Additionally he has a history of alcohol-induced gastritis.  Review of systems: Constitutional: No fever,significant weight change, fatigue  Eyes: No redness, discharge, pain, vision change ENT/mouth: No nasal congestion,  purulent discharge, earache,change in hearing ,sore throat  Cardiovascular: No chest pain, palpitations,paroxysmal nocturnal dyspnea, claudication, edema  Respiratory: No cough, sputum production,hemoptysis, DOE , significant snoring,apnea   Dermatologic: No rash, pruritus Hematologic/lymphatic: No significant bruising, lymphadenopathy,abnormal bleeding  Physical exam:  Pertinent or positive findings:There is slight tense effusion of the right knee. There is minimal increase in temperature without definite clinical cellulitis. No pustules or abscess formation are present clinically.  S4  is present; breath sounds are decreased. Abdomen is protuberant. The left posterior tibial pulse is decreased.   General appearance:Adequately nourished; no acute distress , increased work of breathing is present.   Lymphatic: No lymphadenopathy about the head, neck, axilla . Eyes: No conjunctival inflammation or lid edema is present. There is no scleral icterus. Neck:  No thyromegaly, masses, tenderness noted.    Heart:  Normal rate and regular rhythm without  murmur, click, rub .  Abdomen:Bowel sounds are normal. Abdomen is soft and nontender with no organomegaly, hernias,masses. GU: deferred  Extremities:  No cyanosis, clubbing  Neurologic exam : Strength equal  in upper & lower extremities Balance,Rhomberg,finger to nose testing could not be completed due to clinical state Deep tendon reflexes are equal Skin: Warm & dry w/o tenting. No significant lesions or rash.  #1 right knee effusion with slight increase in temperature of the skin. Subjective report of possible purulent drainage. Clinically @ this time no abscess or clinical cellulitis is suggested.  The effusion is in the context of active PT/OT and significant distance ambulation. The patient was congratulated on his work ethic, but orthopedic reassessment and definition of guidelines concerning ambulation needed. #2 increased adverse risk of maintenance opioid therapy of adverse event or respiratory suppression. Calculated to be over 80% in the next 6 months. This information was shared with the patient.

## 2016-08-11 NOTE — Assessment & Plan Note (Signed)
08/11/16 patient has no interest in stopping smoking. He had been taking off his patch and signing himself out to go outside to smoke. Therefore the patch was discontinued.

## 2016-08-11 NOTE — Patient Instructions (Signed)
See assessment and plan under each diagnosis in the problem list and acutely for this visit 

## 2016-08-11 NOTE — Assessment & Plan Note (Signed)
Extremely high risk of adverse event or even rspiratory suppression and death in the next 6 months discussed with the patient were he to continue maintenance opioid Offered copy of Dr. Kern Reap article "A Dumb Way to Die" which contains opioid risk calculating tool, but he declined

## 2016-08-11 NOTE — Assessment & Plan Note (Addendum)
08/11/16 patient describes production of puslike material from suture site, persistent swelling, and ongoing pain CBC and differential, CRP, sedimentation rate Orthopedic follow-up indicated at this time The patient has been ambulatory at a high level; hopefully Xarelto DVT prophylaxis could be discontinued

## 2016-08-12 LAB — CBC AND DIFFERENTIAL
HCT: 37 % — AB (ref 41–53)
HEMOGLOBIN: 12 g/dL — AB (ref 13.5–17.5)
PLATELETS: 351 10*3/uL (ref 150–399)
WBC: 6.2 10*3/mL

## 2016-08-12 LAB — POCT ERYTHROCYTE SEDIMENTATION RATE, NON-AUTOMATED: Sed Rate: 16 mm

## 2016-08-13 ENCOUNTER — Encounter: Payer: Self-pay | Admitting: *Deleted

## 2016-08-17 ENCOUNTER — Telehealth (INDEPENDENT_AMBULATORY_CARE_PROVIDER_SITE_OTHER): Payer: Self-pay | Admitting: Orthopedic Surgery

## 2016-08-17 ENCOUNTER — Ambulatory Visit (INDEPENDENT_AMBULATORY_CARE_PROVIDER_SITE_OTHER): Payer: Medicare Other | Admitting: Orthopedic Surgery

## 2016-08-17 MED ORDER — ACETAMINOPHEN-CODEINE #3 300-30 MG PO TABS: 1.0000 | ORAL_TABLET | Freq: Two times a day (BID) | ORAL | 0 refills | Status: DC | PRN

## 2016-08-17 NOTE — Addendum Note (Signed)
Addended byLaurann Montana on: 08/17/2016 04:39 PM   Modules accepted: Orders

## 2016-08-17 NOTE — Telephone Encounter (Signed)
Please advise. Patient had an appt today to have re-eval of post op knee and no showed.

## 2016-08-17 NOTE — Telephone Encounter (Signed)
[name] called wanting to know if Dr. [name] could prescribe him some pain medication. He is currently in a lot of pain and needs some relief. Tramadol has been prescribed to him in the past and he says it doesn't help and it makes him feel worse. CB # [phone]

## 2016-08-17 NOTE — Telephone Encounter (Signed)
Called to pharmacy. Patient advised done.  

## 2016-08-17 NOTE — Telephone Encounter (Signed)
Ok for t 3 1 po q 12 # 30 pls clal thx

## 2016-08-20 ENCOUNTER — Telehealth (INDEPENDENT_AMBULATORY_CARE_PROVIDER_SITE_OTHER): Payer: Self-pay

## 2016-08-20 NOTE — Telephone Encounter (Signed)
Please advise 

## 2016-08-20 NOTE — Telephone Encounter (Signed)
Patient Fiance [name] called stating that Tylenol #3 is not helping with pain.  Patient would like a stronger Rx.  CB# is [phone].

## 2016-08-21 NOTE — Telephone Encounter (Signed)
n

## 2016-08-21 NOTE — Telephone Encounter (Signed)
Called s/w patient and advised.  

## 2016-08-26 ENCOUNTER — Telehealth (INDEPENDENT_AMBULATORY_CARE_PROVIDER_SITE_OTHER): Payer: Self-pay | Admitting: Orthopedic Surgery

## 2016-08-26 NOTE — Telephone Encounter (Signed)
See message below °

## 2016-08-26 NOTE — Telephone Encounter (Signed)
Dr Dean's patient

## 2016-08-26 NOTE — Telephone Encounter (Signed)
Patient called advised his rt leg is really swollen and he is in a lot of pain. Patient said his leg has been hot to the touch since he had surgery. Patient said he has been taking tylenol and ibuprofen. Patient said he is also having real bad pain in the surgical site. The number to contact patient is [phone]

## 2016-08-27 ENCOUNTER — Telehealth (INDEPENDENT_AMBULATORY_CARE_PROVIDER_SITE_OTHER): Payer: Self-pay | Admitting: Orthopedic Surgery

## 2016-08-27 NOTE — Telephone Encounter (Signed)
Patient asked for a refill on pain medication. Was wondering if he could be prescribed something stronger than the Tylenol 3. CB # [phone]

## 2016-08-27 NOTE — Telephone Encounter (Signed)
Okay to come in tomorrow.

## 2016-08-28 MED ORDER — ACETAMINOPHEN-CODEINE #3 300-30 MG PO TABS: 1.0000 | ORAL_TABLET | Freq: Three times a day (TID) | ORAL | 0 refills | Status: DC | PRN

## 2016-08-28 NOTE — Telephone Encounter (Signed)
Okay to refill Tylenol 31 by mouth every 8 hours when necessary pain #40 no refills

## 2016-08-28 NOTE — Addendum Note (Signed)
Addended by: Meyer Cory on: 08/28/2016 11:01 AM   Modules accepted: Orders

## 2016-08-28 NOTE — Telephone Encounter (Signed)
This is a Scientist, physiological pt

## 2016-08-28 NOTE — Addendum Note (Signed)
Addendum  created 08/28/16 1417 by Rica Koyanagi, MD   Sign clinical note

## 2016-08-28 NOTE — Telephone Encounter (Signed)
Entered into system. Patient advised.

## 2016-08-28 NOTE — Telephone Encounter (Signed)
Please advise 

## 2016-08-28 NOTE — Telephone Encounter (Signed)
IC pt and discussed, he will see if he can get a ride here on Monday and call us Monday morning to advise, and we can work him in Monday afternoon Dean's schedule, He cannot get a ride here today.

## 2016-08-31 ENCOUNTER — Ambulatory Visit (INDEPENDENT_AMBULATORY_CARE_PROVIDER_SITE_OTHER): Payer: Medicare Other | Admitting: Orthopedic Surgery

## 2016-09-03 ENCOUNTER — Ambulatory Visit (INDEPENDENT_AMBULATORY_CARE_PROVIDER_SITE_OTHER): Payer: Medicare Other | Admitting: Orthopedic Surgery

## 2016-09-21 ENCOUNTER — Ambulatory Visit (INDEPENDENT_AMBULATORY_CARE_PROVIDER_SITE_OTHER): Payer: Medicare Other | Admitting: Orthopedic Surgery

## 2016-09-28 ENCOUNTER — Ambulatory Visit (INDEPENDENT_AMBULATORY_CARE_PROVIDER_SITE_OTHER): Payer: Self-pay

## 2016-09-28 ENCOUNTER — Ambulatory Visit (INDEPENDENT_AMBULATORY_CARE_PROVIDER_SITE_OTHER): Payer: Medicare Other | Admitting: Orthopedic Surgery

## 2016-09-28 DIAGNOSIS — Z96651 Presence of right artificial knee joint: Secondary | ICD-10-CM

## 2016-09-28 MED ORDER — OXYCODONE HCL 5 MG PO TABS: 5.0000 mg | ORAL_TABLET | Freq: Two times a day (BID) | ORAL | 0 refills | Status: DC | PRN

## 2016-09-28 NOTE — Progress Notes (Signed)
Post-Op Visit Note   Patient: [name]           Date of Birth: April 14, 1965           MRN: [id number] Visit Date: 09/28/2016 PCP: No primary care provider on file.   Assessment & Plan:  Chief Complaint:  Chief Complaint  Patient presents with  . Right Knee - Routine Post Op   Visit Diagnoses:  1. Status post right knee replacement     Plan: [name] is a 51 year old patient postop right total knee replacement.  2 half months out.  Doing well but was sports some occasional pain.  Had it transportation issues with second postop visit on exam he is using a cane but is walking normally.  Range of motion is excellent from 0 to about 115-120 of flexion with no effusion and good stable collateral ligaments at 0 30 and 90.  Patella tracks very nicely.  No warmth to the knee and no effusion.  Plan at this time is patient's doing well following knee replacement under releasing at this time one final pain prescription given but he should really proceed with over-the-counter medication for this.  I'll see him back as needed  Follow-Up Instructions: No Follow-up on file.   Orders:  No orders of the defined types were placed in this encounter.  No orders of the defined types were placed in this encounter.   Imaging: No results found.  PMFS History: Patient Active Problem List   Diagnosis Date Noted  . At risk for adverse drug event 07/28/2016  . Primary osteoarthritis of right knee 05/27/2016  . Upper GI bleed 02/18/2015  . Benign essential HTN 02/18/2015  . Anemia of chronic disease 02/18/2015  . Acute alcohol intoxication ([place]) 02/18/2015  . Pulmonary nodule 02/18/2015  . Metabolic acidosis 16/03/930  . Alcohol dependence with intoxication with complication ([place])   . Major depressive disorder, recurrent episode, moderate ([place])   . Cocaine abuse 03/02/2014  . History of schizophrenia 03/01/2014  . Tobacco abuse 12/06/2013  . Hematemesis 12/04/2013   Past Medical History:    Diagnosis Date  . Anxiety   . Arthritis   . Asthma   . GERD (gastroesophageal reflux disease)   . Pneumonia    hx  . Pre-diabetes   . Schizophrenia ([place])   . Stroke [facility])    2009-or10    Family History  Problem Relation Age of Onset  . Adopted: Yes    Past Surgical History:  Procedure Laterality Date  . ESOPHAGOGASTRODUODENOSCOPY Left 12/05/2013   Procedure: ESOPHAGOGASTRODUODENOSCOPY (EGD);  Surgeon: Arta Silence, MD;  Location: [facility];  Service: Endoscopy;  Laterality: Left;  . ESOPHAGOGASTRODUODENOSCOPY (EGD) WITH PROPOFOL N/A 02/20/2015   Procedure: ESOPHAGOGASTRODUODENOSCOPY (EGD) WITH PROPOFOL;  Surgeon: Wilford Corner, MD;  Location: [facility];  Service: Endoscopy;  Laterality: N/A;  . TOTAL KNEE ARTHROPLASTY Right 07/21/2016   Procedure: TOTAL KNEE ARTHROPLASTY;  Surgeon: Meredith Pel, MD;  Location: [place];  Service: Orthopedics;  Laterality: Right;   Social History   Occupational History  . Not on file.   Social History Main Topics  . Smoking status: Heavy Tobacco Smoker    Packs/day: 0.50    Years: 13.00    Types: Cigarettes  . Smokeless tobacco: Never Used     Comment: onset age 6; upto 1.5 ppd  . Alcohol use No     Comment: quit 2015; formerly up to 2 fifths/day  . Drug use: No  . Sexual activity: Yes

## 2016-10-14 ENCOUNTER — Telehealth (INDEPENDENT_AMBULATORY_CARE_PROVIDER_SITE_OTHER): Payer: Self-pay | Admitting: Orthopedic Surgery

## 2016-10-14 NOTE — Telephone Encounter (Signed)
Please advise 

## 2016-10-14 NOTE — Telephone Encounter (Signed)
PT REQUEST REFILL OF PAIN MEDS PLEASE.  [phone]

## 2016-10-15 NOTE — Telephone Encounter (Signed)
Called s/w patient and advised.  

## 2016-10-15 NOTE — Telephone Encounter (Signed)
n

## 2016-10-21 ENCOUNTER — Ambulatory Visit: Payer: Medicare Other | Admitting: Urgent Care

## 2016-10-26 ENCOUNTER — Ambulatory Visit (INDEPENDENT_AMBULATORY_CARE_PROVIDER_SITE_OTHER): Payer: Medicare Other | Admitting: Orthopedic Surgery

## 2016-10-26 ENCOUNTER — Encounter (INDEPENDENT_AMBULATORY_CARE_PROVIDER_SITE_OTHER): Payer: Self-pay | Admitting: Orthopedic Surgery

## 2016-10-26 DIAGNOSIS — M25561 Pain in right knee: Secondary | ICD-10-CM | POA: Diagnosis not present

## 2016-10-26 LAB — CBC WITH DIFFERENTIAL/PLATELET
Basophils Absolute: 0 cells/uL (ref 0–200)
Basophils Relative: 0 %
Eosinophils Absolute: 0 cells/uL — ABNORMAL LOW (ref 15–500)
Eosinophils Relative: 0 %
HCT: 37.8 % — ABNORMAL LOW (ref 38.5–50.0)
Hemoglobin: 12.7 g/dL — ABNORMAL LOW (ref 13.2–17.1)
LYMPHS ABS: 714 {cells}/uL — AB (ref 850–3900)
LYMPHS PCT: 3 %
MCH: 28.9 pg (ref 27.0–33.0)
MCHC: 33.6 g/dL (ref 32.0–36.0)
MCV: 85.9 fL (ref 80.0–100.0)
MPV: 8.5 fL (ref 7.5–12.5)
Monocytes Absolute: 714 cells/uL (ref 200–950)
Monocytes Relative: 3 %
NEUTROS PCT: 94 %
Neutro Abs: 22372 cells/uL — ABNORMAL HIGH (ref 1500–7800)
Platelets: 321 10*3/uL (ref 140–400)
RBC: 4.4 MIL/uL (ref 4.20–5.80)
RDW: 14 % (ref 11.0–15.0)
WBC: 23.8 10*3/uL — ABNORMAL HIGH (ref 3.8–10.8)

## 2016-10-26 MED ORDER — ACETAMINOPHEN-CODEINE #3 300-30 MG PO TABS: 1.0000 | ORAL_TABLET | Freq: Two times a day (BID) | ORAL | 0 refills | Status: DC | PRN

## 2016-10-26 NOTE — Progress Notes (Signed)
Office Visit Note   Patient: [name]           Date of Birth: [birth year]           MRN: [id number] Visit Date: 10/26/2016 Requested by: No referring provider defined for this encounter. PCP: Patient, No Pcp Per  Subjective: Chief Complaint  Patient presents with  . Right Knee - Follow-up    HPI: [name] is a 51 year old patient with right knee pain.  Had right total knee replacement for 2418.  He's been doing well but he reports having chills last night.  Denies having any fevers.  Reports some throbbing pain.  He is going to see his father who is ill.  He's concerned about infection.  Taking Tylenol.              ROS: All systems reviewed are negative as they relate to the chief complaint within the history of present illness.  Patient denies  fevers or chills.   Assessment & Plan: Visit Diagnoses:  1. Right knee pain, unspecified chronicity     Plan: Impression is extremely well functioning right total knee replacement with trace effusion and no definitive clinical evidence of infection.  I will draw CBC just sedimentation rate C-reactive protein as a baseline and see him back in 4 months.  One prescription Tylenol 3 provided as he does have a long bus ride by his report  Follow-Up Instructions: Return in about 4 months (around 02/26/2017).   Orders:  Orders Placed This Encounter  Procedures  . CBC with Differential  . Sed Rate (ESR)  . C-reactive protein   Meds ordered this encounter  Medications  . acetaminophen-codeine (TYLENOL #3) 300-30 MG tablet    Sig: Take 1 tablet by mouth 2 (two) times daily as needed for moderate pain.    Dispense:  10 tablet    Refill:  0      Procedures: No procedures performed   Clinical Data: No additional findings.  Objective: Vital Signs: There were no vitals taken for this visit.  Physical Exam:   Constitutional: Patient appears well-developed HEENT:  Head: Normocephalic Eyes:EOM are normal Neck: Normal range of  motion Cardiovascular: Normal rate Pulmonary/chest: Effort normal Neurologic: Patient is alert Skin: Skin is warm Psychiatric: Patient has normal mood and affect    Ortho Exam: Orthopedic exam demonstrates right knee with full extension essentially full flexion trace effusion collaterals are stable no warmth to the knee.  Specialty Comments:  No specialty comments available.  Imaging: No results found.   PMFS History: Patient Active Problem List   Diagnosis Date Noted  . At risk for adverse drug event 07/28/2016  . Primary osteoarthritis of right knee 05/27/2016  . Upper GI bleed 02/18/2015  . Benign essential HTN 02/18/2015  . Anemia of chronic disease 02/18/2015  . Acute alcohol intoxication ([place]) 02/18/2015  . Pulmonary nodule 02/18/2015  . Metabolic acidosis 47/42/5956  . Alcohol dependence with intoxication with complication ([place])   . Major depressive disorder, recurrent episode, moderate ([place])   . Cocaine abuse 03/02/2014  . History of schizophrenia 03/01/2014  . Tobacco abuse 12/06/2013  . Hematemesis 12/04/2013   Past Medical History:  Diagnosis Date  . Anxiety   . Arthritis   . Asthma   . GERD (gastroesophageal reflux disease)   . Pneumonia    hx  . Pre-diabetes   . Schizophrenia ([place])   . Stroke [facility])    2009-or10    Family History  Problem Relation Age  Office Visit Note   Patient: [name]           Date of Birth: [birth year]           MRN: [id number] Visit Date: 10/26/2016 Requested by: No referring provider defined for this encounter. PCP: Patient, No Pcp Per  Subjective: Chief Complaint  Patient presents with  . Right Knee - Follow-up    HPI: [name] is a 51 year old patient with right knee pain.  Had right total knee replacement for 2418.  He's been doing well but he reports having chills last night.  Denies having any fevers.  Reports some throbbing pain.  He is going to see his father who is ill.  He's concerned about infection.  Taking Tylenol.              ROS: All systems reviewed are negative as they relate to the chief complaint within the history of present illness.  Patient denies  fevers or chills.   Assessment & Plan: Visit Diagnoses:  1. Right knee pain, unspecified chronicity     Plan: Impression is extremely well functioning right total knee replacement with trace effusion and no definitive clinical evidence of infection.  I will draw CBC just sedimentation rate C-reactive protein as a baseline and see him back in 4 months.  One prescription Tylenol 3 provided as he does have a long bus ride by his report  Follow-Up Instructions: Return in about 4 months (around 02/26/2017).   Orders:  Orders Placed This Encounter  Procedures  . CBC with Differential  . Sed Rate (ESR)  . C-reactive protein   Meds ordered this encounter  Medications  . acetaminophen-codeine (TYLENOL #3) 300-30 MG tablet    Sig: Take 1 tablet by mouth 2 (two) times daily as needed for moderate pain.    Dispense:  10 tablet    Refill:  0      Procedures: No procedures performed   Clinical Data: No additional findings.  Objective: Vital Signs: There were no vitals taken for this visit.  Physical Exam:   Constitutional: Patient appears well-developed HEENT:  Head: Normocephalic Eyes:EOM are normal Neck: Normal range of  motion Cardiovascular: Normal rate Pulmonary/chest: Effort normal Neurologic: Patient is alert Skin: Skin is warm Psychiatric: Patient has normal mood and affect    Ortho Exam: Orthopedic exam demonstrates right knee with full extension essentially full flexion trace effusion collaterals are stable no warmth to the knee.  Specialty Comments:  No specialty comments available.  Imaging: No results found.   PMFS History: Patient Active Problem List   Diagnosis Date Noted  . At risk for adverse drug event 07/28/2016  . Primary osteoarthritis of right knee 05/27/2016  . Upper GI bleed 02/18/2015  . Benign essential HTN 02/18/2015  . Anemia of chronic disease 02/18/2015  . Acute alcohol intoxication ([place]) 02/18/2015  . Pulmonary nodule 02/18/2015  . Metabolic acidosis 47/42/5956  . Alcohol dependence with intoxication with complication ([place])   . Major depressive disorder, recurrent episode, moderate ([place])   . Cocaine abuse 03/02/2014  . History of schizophrenia 03/01/2014  . Tobacco abuse 12/06/2013  . Hematemesis 12/04/2013   Past Medical History:  Diagnosis Date  . Anxiety   . Arthritis   . Asthma   . GERD (gastroesophageal reflux disease)   . Pneumonia    hx  . Pre-diabetes   . Schizophrenia ([place])   . Stroke [facility])    2009-or10    Family History  Problem Relation Age

## 2016-10-27 LAB — SEDIMENTATION RATE: SED RATE: 7 mm/h (ref 0–20)

## 2016-10-27 LAB — C-REACTIVE PROTEIN: CRP: 82.8 mg/L — AB (ref <8.0)

## 2016-10-27 NOTE — Progress Notes (Signed)
Needs to come in Monday for knee aspiration

## 2016-10-29 ENCOUNTER — Encounter (INDEPENDENT_AMBULATORY_CARE_PROVIDER_SITE_OTHER): Payer: Self-pay | Admitting: Orthopedic Surgery

## 2016-10-29 ENCOUNTER — Ambulatory Visit (INDEPENDENT_AMBULATORY_CARE_PROVIDER_SITE_OTHER): Payer: Medicare Other | Admitting: Orthopedic Surgery

## 2016-10-29 DIAGNOSIS — Z96651 Presence of right artificial knee joint: Secondary | ICD-10-CM

## 2016-10-29 DIAGNOSIS — M25461 Effusion, right knee: Secondary | ICD-10-CM

## 2016-10-29 MED ORDER — LIDOCAINE HCL 1 % IJ SOLN
5.0000 mL | INTRAMUSCULAR | Status: AC | PRN
  Administered 2016-10-29: 5 mL

## 2016-10-29 NOTE — Progress Notes (Signed)
Office Visit Note   Patient: [name]           Date of Birth: 03/08/1966           MRN: [id number] Visit Date: 10/29/2016 Requested by: No referring provider defined for this encounter. PCP: Patient, No Pcp Per  Subjective: Chief Complaint  Patient presents with  . Right Knee - Follow-up    HPI: [name] is a patient who underwent right total knee replacement for 2418.  I saw him earlier in the week and wanted to get some blood work on him because of some pain he was having in the right knee.  Blood work was elevated with elevated white count 25,000 and elevated C-reactive protein but normal sedimentation rate.  He hasn't had any fevers or chills since.  Getting ready to go on a trip.              ROS: All systems reviewed are negative as they relate to the chief complaint within the history of present illness.  Patient denies  fevers or chills.   Assessment & Plan: Visit Diagnoses:  1. Effusion, right knee   2. History of total knee arthroplasty, right     Plan: Impression is right sided knee pain with trace effusion.  The effusion was tapped today and I sent for Gram stain and cell count and aerobic and anaerobic cultures and crystals.  I'll find out what that shows and let him know on Sunday or Monday if any intervention is required.  Concern at this time would be for latent infection.  Follow-Up Instructions: Return if symptoms worsen or fail to improve.   Orders:  Orders Placed This Encounter  Procedures  . Gram stain  . Anaerobic and Aerobic Culture  . Cell count + diff,  w/ cryst-synvl fld   No orders of the defined types were placed in this encounter.     Procedures: Large Joint Inj Date/Time: 10/29/2016 11:48 PM Performed by: Cammy Copa Authorized by: Cammy Copa   Consent Given by:  Patient Site marked: the procedure site was marked   Timeout: prior to procedure the correct patient, procedure, and site was verified   Indications:   Pain, joint swelling and diagnostic evaluation Location:  Knee Site:  R knee Prep: patient was prepped and draped in usual sterile fashion   Needle Size:  18 G Needle Length:  1.5 inches Approach:  Superolateral Ultrasound Guidance: No   Fluoroscopic Guidance: No   Arthrogram: No   Medications:  5 mL lidocaine 1 % Aspiration Attempted: Yes   Aspirate amount (mL):  25 Aspirate:  Cloudy Patient tolerance:  Patient tolerated the procedure well with no immediate complications     Clinical Data: No additional findings.  Objective: Vital Signs: There were no vitals taken for this visit.  Physical Exam:   Constitutional: Patient appears well-developed HEENT:  Head: Normocephalic Eyes:EOM are normal Neck: Normal range of motion Cardiovascular: Normal rate Pulmonary/chest: Effort normal Neurologic: Patient is alert Skin: Skin is warm Psychiatric: Patient has normal mood and affect    Ortho Exam: [name] examination the knee shows excellent range of motion on the right with stable collateral crucial ligaments trace effusion is present.  Expected amount of warmth is present right knee versus left no proximal lymphadenopathy noted  Specialty Comments:  No specialty comments available.  Imaging: No results found.   PMFS History: Patient Active Problem List   Diagnosis Date Noted  . At risk for  adverse drug event 07/28/2016  . Primary osteoarthritis of right knee 05/27/2016  . Upper GI bleed 02/18/2015  . Benign essential HTN 02/18/2015  . Anemia of chronic disease 02/18/2015  . Acute alcohol intoxication ([facility]) 02/18/2015  . Pulmonary nodule 02/18/2015  . Metabolic acidosis 02/18/2015  . Alcohol dependence with intoxication with complication ([facility])   . Major depressive disorder, recurrent episode, moderate ([facility])   . Cocaine abuse 03/02/2014  . History of schizophrenia 03/01/2014  . Tobacco abuse 12/06/2013  . Hematemesis 12/04/2013   Past Medical History:    Diagnosis Date  . Anxiety   . Arthritis   . Asthma   . GERD (gastroesophageal reflux disease)   . Pneumonia    hx  . Pre-diabetes   . Schizophrenia ([facility])   . Stroke [facility])    2009-or10    Family History  Problem Relation Age of Onset  . Adopted: Yes    Past Surgical History:  Procedure Laterality Date  . ESOPHAGOGASTRODUODENOSCOPY Left 12/05/2013   Procedure: ESOPHAGOGASTRODUODENOSCOPY (EGD);  Surgeon: Willis Modena, MD;  Location: [facility];  Service: Endoscopy;  Laterality: Left;  . ESOPHAGOGASTRODUODENOSCOPY (EGD) WITH PROPOFOL N/A 02/20/2015   Procedure: ESOPHAGOGASTRODUODENOSCOPY (EGD) WITH PROPOFOL;  Surgeon: Charlott Rakes, MD;  Location: [facility];  Service: Endoscopy;  Laterality: N/A;  . TOTAL KNEE ARTHROPLASTY Right 07/21/2016   Procedure: TOTAL KNEE ARTHROPLASTY;  Surgeon: Cammy Copa, MD;  Location: [facility] OR;  Service: Orthopedics;  Laterality: Right;   Social History   Occupational History  . Not on file.   Social History Main Topics  . Smoking status: Heavy Tobacco Smoker    Packs/day: 0.50    Years: 13.00    Types: Cigarettes  . Smokeless tobacco: Never Used     Comment: onset age 57; upto 1.5 ppd  . Alcohol use No     Comment: quit 2015; formerly up to 2 fifths/day  . Drug use: No  . Sexual activity: Yes

## 2016-10-30 LAB — SYNOVIAL CELL COUNT + DIFF, W/ CRYSTALS
BASOPHILS, %: 0 %
Eosinophils-Synovial: 0 % (ref 0–2)
LYMPHOCYTES-SYNOVIAL FLD: 2 % (ref 0–74)
Monocyte/Macrophage: 25 % (ref 0–69)
NEUTROPHIL, SYNOVIAL: 73 % — AB (ref 0–24)
Synoviocytes, %: 0 % (ref 0–15)
WBC, SYNOVIAL: 1601 {cells}/uL — AB (ref <150)

## 2016-10-30 LAB — GRAM STAIN: Gram Stain: NONE SEEN

## 2016-11-02 ENCOUNTER — Telehealth (INDEPENDENT_AMBULATORY_CARE_PROVIDER_SITE_OTHER): Payer: Self-pay

## 2016-11-02 ENCOUNTER — Telehealth (INDEPENDENT_AMBULATORY_CARE_PROVIDER_SITE_OTHER): Payer: Self-pay | Admitting: Radiology

## 2016-11-02 LAB — SPECIMEN STATUS REPORT

## 2016-11-02 NOTE — Telephone Encounter (Signed)
VM on triage, not sure who from, but they were asking for a call back with the lab results. Please call as soon as possible they said.

## 2016-11-02 NOTE — Telephone Encounter (Signed)
Please advise 

## 2016-11-02 NOTE — Telephone Encounter (Signed)
Infection negative to date please call thanks

## 2016-11-02 NOTE — Telephone Encounter (Signed)
See also previous note in chart. I am waiting to be advised. IC them advised waiting to get results from Dr [name].

## 2016-11-02 NOTE — Telephone Encounter (Signed)
Patient called concerning his Lab results from his last office visit on 10/29/16.  CB# is [phone].  Please advise.  Thank You.

## 2016-11-03 LAB — ANAEROBIC AND AEROBIC CULTURE

## 2016-11-03 NOTE — Telephone Encounter (Signed)
Called advised

## 2016-11-05 ENCOUNTER — Encounter: Payer: Self-pay | Admitting: Family Medicine

## 2016-11-05 ENCOUNTER — Ambulatory Visit (INDEPENDENT_AMBULATORY_CARE_PROVIDER_SITE_OTHER): Payer: Medicare Other | Admitting: Family Medicine

## 2016-11-05 VITALS — BP 118/78 | HR 113 | Temp 98.6°F | Wt 206.0 lb

## 2016-11-05 DIAGNOSIS — Z0000 Encounter for general adult medical examination without abnormal findings: Secondary | ICD-10-CM | POA: Diagnosis not present

## 2016-11-05 DIAGNOSIS — K219 Gastro-esophageal reflux disease without esophagitis: Secondary | ICD-10-CM | POA: Diagnosis not present

## 2016-11-05 DIAGNOSIS — I10 Essential (primary) hypertension: Secondary | ICD-10-CM

## 2016-11-05 DIAGNOSIS — R739 Hyperglycemia, unspecified: Secondary | ICD-10-CM | POA: Diagnosis not present

## 2016-11-05 DIAGNOSIS — Z8659 Personal history of other mental and behavioral disorders: Secondary | ICD-10-CM

## 2016-11-05 DIAGNOSIS — F331 Major depressive disorder, recurrent, moderate: Secondary | ICD-10-CM

## 2016-11-05 LAB — POCT GLYCOSYLATED HEMOGLOBIN (HGB A1C): Hemoglobin A1C: 5.6

## 2016-11-05 LAB — TEST CODE CHANGE

## 2016-11-05 LAB — AEROBIC CULTURE

## 2016-11-05 MED ORDER — AMLODIPINE BESYLATE 2.5 MG PO TABS: 2.5000 mg | ORAL_TABLET | Freq: Every day | ORAL | 0 refills | Status: DC

## 2016-11-05 MED ORDER — DOCUSATE SODIUM 100 MG PO CAPS: 100.0000 mg | ORAL_CAPSULE | Freq: Two times a day (BID) | ORAL | 0 refills | Status: DC

## 2016-11-05 MED ORDER — PANTOPRAZOLE SODIUM 40 MG PO TBEC: 40.0000 mg | DELAYED_RELEASE_TABLET | Freq: Two times a day (BID) | ORAL | 0 refills | Status: DC

## 2016-11-05 MED ORDER — PINDOLOL 5 MG PO TABS: 5.0000 mg | ORAL_TABLET | Freq: Two times a day (BID) | ORAL | 0 refills | Status: DC

## 2016-11-05 MED ORDER — MIRTAZAPINE 15 MG PO TABS: 15.0000 mg | ORAL_TABLET | Freq: Every day | ORAL | 0 refills | Status: DC

## 2016-11-05 MED ORDER — BENZTROPINE MESYLATE 1 MG PO TABS: 1.0000 mg | ORAL_TABLET | Freq: Every day | ORAL | 0 refills | Status: DC

## 2016-11-05 MED ORDER — VALSARTAN 160 MG PO TABS: 160.0000 mg | ORAL_TABLET | Freq: Every day | ORAL | 0 refills | Status: DC

## 2016-11-05 MED ORDER — BUPROPION HCL ER (XL) 150 MG PO TB24: 150.0000 mg | ORAL_TABLET | Freq: Every day | ORAL | 0 refills | Status: DC

## 2016-11-05 NOTE — Patient Instructions (Addendum)
It was great seeing you today! We have addressed the following issues today  1. We will do some blood work today and will discuss the results at our next office visit 2. You will receive a call to schedule a colonoscopy  3. Make sure you make an appointment so I can see you in two weeks.  If we did any lab work today, and the results require attention, either me or my nurse will get in touch with you. If everything is normal, you will get a letter in mail and a message via . If you don't hear from [place] in two weeks, please give [place] a call. Otherwise, we look forward to seeing you again at your next visit. If you have any questions or concerns before then, please call the clinic at [phone].  Please bring all your medications to every doctors visit  Sign up for My Chart to have easy access to your labs results, and communication with your Primary care physician. Please ask Front Desk for some assistance.   Please check-out at the front desk before leaving the clinic.    Take Care,   Dr. Andy Gauss

## 2016-11-06 LAB — TSH: TSH: 4.67 u[IU]/mL — AB (ref 0.450–4.500)

## 2016-11-06 LAB — CBC WITH DIFFERENTIAL/PLATELET
BASOS ABS: 0.1 10*3/uL (ref 0.0–0.2)
Basos: 1 %
EOS (ABSOLUTE): 0.3 10*3/uL (ref 0.0–0.4)
Eos: 4 %
HEMOGLOBIN: 12.2 g/dL — AB (ref 13.0–17.7)
Hematocrit: 37.8 % (ref 37.5–51.0)
IMMATURE GRANS (ABS): 0 10*3/uL (ref 0.0–0.1)
Immature Granulocytes: 0 %
LYMPHS: 30 %
Lymphocytes Absolute: 2 10*3/uL (ref 0.7–3.1)
MCH: 27.9 pg (ref 26.6–33.0)
MCHC: 32.3 g/dL (ref 31.5–35.7)
MCV: 86 fL (ref 79–97)
MONOCYTES: 6 %
Monocytes Absolute: 0.4 10*3/uL (ref 0.1–0.9)
Neutrophils Absolute: 3.9 10*3/uL (ref 1.4–7.0)
Neutrophils: 59 %
Platelets: 342 10*3/uL (ref 150–379)
RBC: 4.38 x10E6/uL (ref 4.14–5.80)
RDW: 14 % (ref 12.3–15.4)
WBC: 6.6 10*3/uL (ref 3.4–10.8)

## 2016-11-06 LAB — CMP14+EGFR
ALT: 6 IU/L (ref 0–44)
AST: 16 IU/L (ref 0–40)
Albumin/Globulin Ratio: 1.4 (ref 1.2–2.2)
Albumin: 3.8 g/dL (ref 3.5–5.5)
Alkaline Phosphatase: 136 IU/L — ABNORMAL HIGH (ref 39–117)
BUN/Creatinine Ratio: 6 — ABNORMAL LOW (ref 9–20)
BUN: 5 mg/dL — AB (ref 6–24)
CALCIUM: 8.9 mg/dL (ref 8.7–10.2)
CHLORIDE: 100 mmol/L (ref 96–106)
CO2: 20 mmol/L (ref 20–29)
Creatinine, Ser: 0.84 mg/dL (ref 0.76–1.27)
GFR calc Af Amer: 117 mL/min/{1.73_m2} (ref >59)
GFR calc non Af Amer: 101 mL/min/{1.73_m2} (ref >59)
GLUCOSE: 114 mg/dL — AB (ref 65–99)
Globulin, Total: 2.7 g/dL (ref 1.5–4.5)
Potassium: 4.3 mmol/L (ref 3.5–5.2)
Sodium: 134 mmol/L (ref 134–144)
Total Protein: 6.5 g/dL (ref 6.0–8.5)

## 2016-11-06 LAB — C-REACTIVE PROTEIN: CRP: 21.9 mg/L — AB (ref 0.0–4.9)

## 2016-11-06 LAB — SEDIMENTATION RATE: SED RATE: 9 mm/h (ref 0–30)

## 2016-11-07 NOTE — Progress Notes (Signed)
Subjective:    Patient ID: [name], male    DOB: [birth year], 51 y.o.   MRN: [id number]   CC: Establishing car with new PCP  HPI: Patient is a 51 yo male with a past medical history significant for stroke, GERD, arthritis, asthma, anxiety and substance abuse who present today to establish care with new PCP. Patient does not have any acute complaints at today visit and would like to refill some of his medications. Patient had a total right knee arthroplasty in April and report inflammation with synovial fluid collection and removal at the end of July. Patient denies any chest pain, abdominal pain, SOB, headaches, dizziness, vision or hearing problems. Patient denies alcohol and drug abuse.  Smoking status reviewed   ROS: all other systems were reviewed and are negative other than in the HPI   Past Medical History:  Diagnosis Date  . Anxiety   . Arthritis   . Asthma   . GERD (gastroesophageal reflux disease)   . Pneumonia    hx  . Pre-diabetes   . Schizophrenia ([facility])   . Stroke [facility])    2009-or10    Past Surgical History:  Procedure Laterality Date  . ESOPHAGOGASTRODUODENOSCOPY Left 12/05/2013   Procedure: ESOPHAGOGASTRODUODENOSCOPY (EGD);  Surgeon: Willis Modena, MD;  Location: [facility];  Service: Endoscopy;  Laterality: Left;  . ESOPHAGOGASTRODUODENOSCOPY (EGD) WITH PROPOFOL N/A 02/20/2015   Procedure: ESOPHAGOGASTRODUODENOSCOPY (EGD) WITH PROPOFOL;  Surgeon: Charlott Rakes, MD;  Location: [facility];  Service: Endoscopy;  Laterality: N/A;  . TOTAL KNEE ARTHROPLASTY Right 07/21/2016   Procedure: TOTAL KNEE ARTHROPLASTY;  Surgeon: Cammy Copa, MD;  Location: [facility] OR;  Service: Orthopedics;  Laterality: Right;    Past medical history, surgical, family, and social history reviewed and updated in the EMR as appropriate.  Objective:  BP 118/78   Pulse (!) 113   Temp 98.6 F (37 C) (Oral)   Wt 206 lb (93.4 kg)   SpO2 97%   BMI 31.32 kg/m   Vitals and  nursing note reviewed  General: NAD, pleasant, able to participate in exam Cardiac: RRR, normal heart sounds, no murmurs. 2+ radial and PT pulses bilaterally Respiratory: CTAB, normal effort, No wheezes, rales or rhonchi Abdomen: soft, nontender, nondistended, no hepatic or splenomegaly, +BS Extremities: no edema or cyanosis. WWP. Skin: warm and dry, no rashes noted Neuro: alert and oriented x4, no focal deficits Psych: Normal affect and mood   Assessment & Plan:   #Establishing care with PCP Patient recently had CBC elevated WBCs (23.8) with significant left shift as well as a high CRP (82.8)  likely secondary to knee inflammation/infection for total right knee arthroscopy. Synovial fluid culture was negative. Patient was treated by orthopedic surgeon and ask to follow up with PCP. Patient denies any sign of infection such as fever, chills, nausea, vomiting, palpitations. He is well appearing today in clinic. Given abnormal lab results and initial visit will order follow up lab to assess patient status. Patient also has a history of alcohol and cocaine use and is on beta blocker. Patient has assure me that he has quit both, but I have my doubt. Patient is 58 and is due for a colonoscopy. --Order CBC with diff, CMP, TSH, CRP and Sed rate, A1c, UDS --Referral to GI for colonoscopy --Refilled all medications --Patient will follow up in two weeks to discuss results and further work up as needed.   Lovena Neighbours, MD [facility] PGY-2

## 2016-11-09 ENCOUNTER — Telehealth: Payer: Self-pay | Admitting: Family Medicine

## 2016-11-09 ENCOUNTER — Other Ambulatory Visit: Payer: Self-pay | Admitting: Family Medicine

## 2016-11-09 ENCOUNTER — Encounter: Payer: Self-pay | Admitting: Family Medicine

## 2016-11-09 DIAGNOSIS — I10 Essential (primary) hypertension: Secondary | ICD-10-CM

## 2016-11-09 LAB — TOXASSURE SELECT 13 (MW), URINE

## 2016-11-09 MED ORDER — LOSARTAN POTASSIUM 50 MG PO TABS: 50.0000 mg | ORAL_TABLET | Freq: Every day | ORAL | 0 refills | Status: DC

## 2016-11-09 NOTE — Telephone Encounter (Signed)
Pt contacted and informed of rx to pharmacy. Side effects to look for given to mother, she verbalized understanding.

## 2016-11-09 NOTE — Telephone Encounter (Signed)
Page,  Could you please let patient or his mom know that a new prescription has been sent to [facility] in [place] for losartan. Please stop taking if he feels dizzy given his BP was normal in clinic at last OV without any meds.  Thanks  Marjie Skiff, MD [place], PGY-2

## 2016-11-09 NOTE — Telephone Encounter (Signed)
Mom states she can't get pt's valsartin refilled due to recall. Mom wants something else called in today. Pt uses [facility] in [place]. ep

## 2016-11-10 NOTE — Telephone Encounter (Signed)
Patient called stating he thought someone called him about his lab results.  Advised patient that last call from [facility] was about his blood pressure medication.  Brian Barrow, RN

## 2016-11-10 NOTE — Telephone Encounter (Signed)
Patient's girl friend called stating patient did not speak anyone yesterday. Patient is at his mom's house. Please call [phone].  Derl Barrow, RN

## 2016-11-11 NOTE — Telephone Encounter (Signed)
Talked to patient yesterday, he has picked up his Losartan that was sent to his pharmacy of choice. Ask patient to stop taking BB until next week when I see him in clinic.   Marjie Skiff, MD [place], PGY-2

## 2016-11-16 ENCOUNTER — Other Ambulatory Visit: Payer: Self-pay | Admitting: *Deleted

## 2016-11-16 DIAGNOSIS — Z8659 Personal history of other mental and behavioral disorders: Secondary | ICD-10-CM

## 2016-11-16 MED ORDER — SUCRALFATE 1 G PO TABS: 1.0000 g | ORAL_TABLET | Freq: Four times a day (QID) | ORAL | 0 refills | Status: DC

## 2016-11-16 NOTE — Telephone Encounter (Signed)
Patient's wife called stating that patient really need Rx for Carafate. Patient is having a lot of trouble without the medication. Please send in a refill. Patient has up coming appointment this Thursday.  Derl Barrow, RN

## 2016-11-19 ENCOUNTER — Encounter: Payer: Self-pay | Admitting: Family Medicine

## 2016-11-19 ENCOUNTER — Ambulatory Visit (INDEPENDENT_AMBULATORY_CARE_PROVIDER_SITE_OTHER): Payer: Medicare Other | Admitting: Family Medicine

## 2016-11-19 ENCOUNTER — Encounter: Payer: Self-pay | Admitting: Licensed Clinical Social Worker

## 2016-11-19 VITALS — BP 126/84 | HR 110 | Temp 98.2°F | Wt 204.0 lb

## 2016-11-19 DIAGNOSIS — M1711 Unilateral primary osteoarthritis, right knee: Secondary | ICD-10-CM

## 2016-11-19 NOTE — Patient Instructions (Signed)
It was great seeing you today! We have addressed the following issues today  1. I will ceck a CBC to rule out an infection 2. I want you to go to the hospital to get a knee Xray 3. Please call your surgeon to make an appointment [phone] option 3  If we did any lab work today, and the results require attention, either me or my nurse will get in touch with you. If everything is normal, you will get a letter in mail and a message via . If you don't hear from [place] in two weeks, please give [place] a call. Otherwise, we look forward to seeing you again at your next visit. If you have any questions or concerns before then, please call the clinic at [phone].  Please bring all your medications to every doctors visit  Sign up for My Chart to have easy access to your labs results, and communication with your Primary care physician. Please ask Front Desk for some assistance.   Please check-out at the front desk before leaving the clinic.    Take Care,   Dr. Andy Gauss

## 2016-11-19 NOTE — Progress Notes (Signed)
Subjective:    Patient ID: [name], male    DOB: [birth year], 51 y.o.   MRN: [id number]   CC: Follow up on lab results and right knee pain  HPI: Patient is a 51 yo male with a past medical history significant for stroke, GERD, arthritis, asthma, anxiety and substance abuse who presents today to follow up on lab results and also complains of right knee pain.  Right knee pain chronic: Patient reports right knee pain for the past 3 day and rate pain 9/10. Patient as noticed mild swelling on the lateral aspect of his right knee. Patient had a total right knee replacement in April 2018, with subsequent effusion in July . Arthrocentesis was negative for infection though patient had an elevated white count. At last office visit knee was getting better but still giving him trouble. WBC was within normal limit and patient denies any other symptoms concerning for infection. Today, patient denies any fever, chills, abdominal pain, CP or SOB. Patient has been using ibuprofen and tylenol for pain.  Smoking status reviewed   ROS: all other systems were reviewed and are negative other than in the HPI   Past Medical History:  Diagnosis Date  . Anxiety   . Arthritis   . Asthma   . GERD (gastroesophageal reflux disease)   . Pneumonia    hx  . Pre-diabetes   . Schizophrenia (HCC)   . Stroke [facility])    2009-or10    Past Surgical History:  Procedure Laterality Date  . ESOPHAGOGASTRODUODENOSCOPY Left 12/05/2013   Procedure: ESOPHAGOGASTRODUODENOSCOPY (EGD);  Surgeon: Willis Modena, MD;  Location: [facility];  Service: Endoscopy;  Laterality: Left;  . ESOPHAGOGASTRODUODENOSCOPY (EGD) WITH PROPOFOL N/A 02/20/2015   Procedure: ESOPHAGOGASTRODUODENOSCOPY (EGD) WITH PROPOFOL;  Surgeon: Charlott Rakes, MD;  Location: [facility];  Service: Endoscopy;  Laterality: N/A;  . TOTAL KNEE ARTHROPLASTY Right 07/21/2016   Procedure: TOTAL KNEE ARTHROPLASTY;  Surgeon: Cammy Copa, MD;  Location: [facility]  OR;  Service: Orthopedics;  Laterality: Right;    Past medical history, surgical, family, and social history reviewed and updated in the EMR as appropriate.  Objective:  BP 126/84   Pulse (!) 110   Temp 98.2 F (36.8 C) (Oral)   Wt 204 lb (92.5 kg)   SpO2 99%   BMI 31.02 kg/m   Vitals and nursing note reviewed  General: NAD, pleasant, able to participate in exam Cardiac: RRR, normal heart sounds, no murmurs. 2+ radial and PT pulses bilaterally Respiratory: CTAB, normal effort, No wheezes, rales or rhonchi Abdomen: soft, nontender, nondistended, no hepatic or splenomegaly, +BS Extremities: right knee effusion, mild on the lateral aspect warm to the touch, but no erythema or decrease in ROM. Incision has healed well, no drainage noted. Left knee exam is normal. Skin: warm and dry, no rashes noted Neuro: alert and oriented x4, no focal deficits Psych: Normal affect and mood   Assessment & Plan:   #Lab results Discussed with patient that  CBC and BMP were  Much improved since his last bloodwork in July. WBCs is within normal limit, and left shift has resolved. Kidney and liver function are within normal limits. CRP is still elevated but much improved since July and sed rate is back to normal. UDS showed cocaine, and other opioid metabolites consistent with use. --Patient was referred to SW Mrs. Christell Constant to discuss resources available in the community to help him with his substance abuse. --Explain to patient given his drug use I did not  want him on a beta blocker at the moment.  #Right knee pain and effusion  Likely reactive secondary to recent right knee total replacement. Warm to the touch but no other red flags signs concerning for possible septic arthritis. Pain control is difficult because of long history of abuse and tolerance. Patient would benefit from surgical follow up as soon as possible. In the meantime, imaging could help [place] determine severity of inflammation. --Order  complete DG knee right 4 view --Patient given number for orthopedic surgeon so he can call and make an appointment for follow up after we were unsuccessful in reaching them.   Lovena Neighbours, MD [facility] PGY-2

## 2016-11-19 NOTE — Progress Notes (Unsigned)
Total time:15 minutes Type of Service: Rhine warm handoff  Interpretor:No.    SUBJECTIVE: [name] is a 51 y.o. male  Patient was referred by Dr. Andy Gauss for: substance treatment resources.  Patient reports he is opioid dependent and wants to get help.  He denies drinking alcohol and using any other substances. Reports last drink was 1 year ago. Hx of substance treatment several years ago.  Intervention:  Motivational Interviewing, Reflective listening, Data processing manager and Referral to Substance Abuse Program    Issues discussed: previous substance treatment, agencies he has work with in the past and previous successes, community resources and support system.      ASSESSMENT:Patient reports he has an opioid dependency. He denies other substance use, though his lab results indicate positive for cocaine.  Patient may benefit from, and is in agreement to receive further assessment and substance treatment to assist with managing his opioid dependency.   PLAN: 1. Referral:[facility] 2 .Patient is in agreement to contact [facility] for treatment   Warm Hand Off Completed.     Casimer Lanius, LCSW Licensed Clinical Social Worker [facility]   [phone] 8:28 AM

## 2016-11-20 LAB — CBC WITH DIFFERENTIAL/PLATELET
BASOS ABS: 0 10*3/uL (ref 0.0–0.2)
Basos: 0 %
EOS (ABSOLUTE): 0.1 10*3/uL (ref 0.0–0.4)
Eos: 1 %
Hematocrit: 38.3 % (ref 37.5–51.0)
Hemoglobin: 12.9 g/dL — ABNORMAL LOW (ref 13.0–17.7)
IMMATURE GRANULOCYTES: 0 %
Immature Grans (Abs): 0 10*3/uL (ref 0.0–0.1)
LYMPHS ABS: 4.1 10*3/uL — AB (ref 0.7–3.1)
Lymphs: 43 %
MCH: 28.2 pg (ref 26.6–33.0)
MCHC: 33.7 g/dL (ref 31.5–35.7)
MCV: 84 fL (ref 79–97)
MONOCYTES: 6 %
MONOS ABS: 0.6 10*3/uL (ref 0.1–0.9)
NEUTROS PCT: 50 %
Neutrophils Absolute: 4.6 10*3/uL (ref 1.4–7.0)
PLATELETS: 264 10*3/uL (ref 150–379)
RBC: 4.58 x10E6/uL (ref 4.14–5.80)
RDW: 14.7 % (ref 12.3–15.4)
WBC: 9.4 10*3/uL (ref 3.4–10.8)

## 2016-12-01 ENCOUNTER — Other Ambulatory Visit: Payer: Self-pay | Admitting: Family Medicine

## 2016-12-02 ENCOUNTER — Other Ambulatory Visit: Payer: Self-pay | Admitting: *Deleted

## 2016-12-02 DIAGNOSIS — K219 Gastro-esophageal reflux disease without esophagitis: Secondary | ICD-10-CM

## 2016-12-02 DIAGNOSIS — F331 Major depressive disorder, recurrent, moderate: Secondary | ICD-10-CM

## 2016-12-02 DIAGNOSIS — I10 Essential (primary) hypertension: Secondary | ICD-10-CM

## 2016-12-02 DIAGNOSIS — Z8659 Personal history of other mental and behavioral disorders: Secondary | ICD-10-CM

## 2016-12-02 MED ORDER — BENZTROPINE MESYLATE 1 MG PO TABS: 1.0000 mg | ORAL_TABLET | Freq: Every day | ORAL | 0 refills | Status: DC

## 2016-12-02 MED ORDER — BUPROPION HCL ER (XL) 150 MG PO TB24: 150.0000 mg | ORAL_TABLET | Freq: Every day | ORAL | 0 refills | Status: DC

## 2016-12-02 MED ORDER — PANTOPRAZOLE SODIUM 40 MG PO TBEC: 40.0000 mg | DELAYED_RELEASE_TABLET | Freq: Two times a day (BID) | ORAL | 0 refills | Status: DC

## 2016-12-02 MED ORDER — AMLODIPINE BESYLATE 2.5 MG PO TABS: 2.5000 mg | ORAL_TABLET | Freq: Every day | ORAL | 0 refills | Status: DC

## 2016-12-02 MED ORDER — MIRTAZAPINE 15 MG PO TABS: 15.0000 mg | ORAL_TABLET | Freq: Every day | ORAL | 0 refills | Status: DC

## 2016-12-03 MED ORDER — AMLODIPINE BESYLATE 2.5 MG PO TABS: 2.5000 mg | ORAL_TABLET | Freq: Every day | ORAL | 0 refills | Status: DC

## 2016-12-03 MED ORDER — PANTOPRAZOLE SODIUM 40 MG PO TBEC: 40.0000 mg | DELAYED_RELEASE_TABLET | Freq: Two times a day (BID) | ORAL | 0 refills | Status: DC

## 2016-12-03 MED ORDER — BUPROPION HCL ER (XL) 150 MG PO TB24: 150.0000 mg | ORAL_TABLET | Freq: Every day | ORAL | 0 refills | Status: DC

## 2016-12-03 MED ORDER — BENZTROPINE MESYLATE 1 MG PO TABS
1.0000 mg | ORAL_TABLET | Freq: Every day | ORAL | 0 refills | Status: DC
Start: 2016-12-03 — End: 2016-12-30

## 2016-12-03 NOTE — Telephone Encounter (Signed)
Significant other called and states that patient doesn't have a ride to pick up scripts and needs them sent electronically to pharmacy.  Jazmin Hartsell,CMA

## 2016-12-03 NOTE — Addendum Note (Signed)
Addended by: Valerie Roys on: 12/03/2016 01:31 PM   Modules accepted: Orders

## 2016-12-08 ENCOUNTER — Telehealth: Payer: Self-pay | Admitting: *Deleted

## 2016-12-08 NOTE — Telephone Encounter (Addendum)
[name], called in to let us know patient is receiving injections of Invega 819 mg every 3 months for schizophrenia at [facility]. Requests that she be called [phone]) prior to patient's mother. Hubbard Hartshorn, RN, BSN

## 2016-12-10 ENCOUNTER — Telehealth (INDEPENDENT_AMBULATORY_CARE_PROVIDER_SITE_OTHER): Payer: Self-pay | Admitting: Orthopedic Surgery

## 2016-12-10 ENCOUNTER — Ambulatory Visit: Payer: Self-pay | Admitting: Family Medicine

## 2016-12-10 NOTE — Telephone Encounter (Signed)
Patient called asking for some oxycodone 5 mg just until his next appointment on the 19th. He is currently in a lot of pain. CB # [phone]

## 2016-12-10 NOTE — Telephone Encounter (Signed)
Rx request 

## 2016-12-10 NOTE — Telephone Encounter (Signed)
IC advised.  

## 2016-12-10 NOTE — Telephone Encounter (Signed)
n

## 2016-12-11 ENCOUNTER — Ambulatory Visit (INDEPENDENT_AMBULATORY_CARE_PROVIDER_SITE_OTHER): Payer: Medicare Other | Admitting: Orthopedic Surgery

## 2016-12-16 ENCOUNTER — Ambulatory Visit (INDEPENDENT_AMBULATORY_CARE_PROVIDER_SITE_OTHER): Payer: Medicare Other | Admitting: Orthopedic Surgery

## 2016-12-16 ENCOUNTER — Ambulatory Visit: Payer: Self-pay | Admitting: Family Medicine

## 2016-12-17 ENCOUNTER — Encounter (INDEPENDENT_AMBULATORY_CARE_PROVIDER_SITE_OTHER): Payer: Self-pay | Admitting: Orthopedic Surgery

## 2016-12-17 ENCOUNTER — Ambulatory Visit (INDEPENDENT_AMBULATORY_CARE_PROVIDER_SITE_OTHER): Payer: Medicare Other | Admitting: Orthopedic Surgery

## 2016-12-17 ENCOUNTER — Ambulatory Visit (INDEPENDENT_AMBULATORY_CARE_PROVIDER_SITE_OTHER): Payer: Medicare Other

## 2016-12-17 DIAGNOSIS — Z96651 Presence of right artificial knee joint: Secondary | ICD-10-CM | POA: Diagnosis not present

## 2016-12-17 DIAGNOSIS — M25572 Pain in left ankle and joints of left foot: Secondary | ICD-10-CM | POA: Diagnosis not present

## 2016-12-17 HISTORY — DX: Presence of right artificial knee joint: Z96.651

## 2016-12-17 MED ORDER — TRAMADOL HCL 50 MG PO TABS: 50.0000 mg | ORAL_TABLET | Freq: Three times a day (TID) | ORAL | 0 refills | Status: DC | PRN

## 2016-12-17 NOTE — Progress Notes (Signed)
Office Visit Note   Patient: [name]           Date of Birth: 16-Mar-1966           MRN: [id number] Visit Date: 12/17/2016 Requested by: Lovena Neighbours, MD [address] [place], [place] [address] PCP: Lovena Neighbours, MD  Subjective: Chief Complaint  Patient presents with  . Left Ankle - Pain  . Right Knee - Follow-up    HPI: [name] is a 51 year old patient who underwent right total knee replacement for 2418.  Doing recently well with that that has some occasional lateral sided pain.  Patient also describes chronic pain of the leftankle.  He describes having a fracture years ago.  He never had surgery for the problem.  He states his pain is increasing.  He describes pain with weightbearing.              ROS: All systems reviewed are negative as they relate to the chief complaint within the history of present illness.  Patient denies  fevers or chills.   Assessment & Plan: Visit Diagnoses:  1. Presence of right artificial knee joint   2. Pain in left ankle and joints of left foot     Plan:i mpression is well-functioning right total knee replacement with some residual pain.  No effusion today and no proximal lymphadenopathy.  Patient has in stage left ankle arthritis.  Options for treatment on that included observation fusion or replacement.  I don't think injection is a great option for this problem.  One time prescription for Ultram provided.  I'll hold off on narcotics on this patient.  Follow up with me as needed for the knee  Follow-Up Instructions: No Follow-up on file.   Orders:  Orders Placed This Encounter  Procedures  . XR Ankle Complete Left   Meds ordered this encounter  Medications  . traMADol (ULTRAM) 50 MG tablet    Sig: Take 1 tablet (50 mg total) by mouth every 8 (eight) hours as needed.    Dispense:  60 tablet    Refill:  0      Procedures: No procedures performed   Clinical Data: No additional findings.  Objective: Vital Signs:  There were no vitals taken for this visit.  Physical Exam:   Constitutional: Patient appears well-developed HEENT:  Head: Normocephalic Eyes:EOM are normal Neck: Normal range of motion Cardiovascular: Normal rate Pulmonary/chest: Effort normal Neurologic: Patient is alert Skin: Skin is warm Psychiatric: Patient has normal mood and affect    Ortho Exam: orthopedic exam demonstrates excellent range of motion of the right knee with no effusion and no warmth.  No proximal lymphadenopathy is present.  On the left ankle there is diminished range of motion along with pes planus.  Pedal pulses are palpable.  Sensation is intact on the dorsal and plantar aspect of the foot.  Patient has palpable intact nontender anterior to posterior tib peroneal and Achilles tendon.  Specialty Comments:  No specialty comments available.  Imaging: Xr Ankle Complete Left  Result Date: 12/17/2016 AP lateral   End-stage severe tibiotalar a  Overall alignment of the ankle is intact.  No joint space remains on any view.  Dorsal talonavicular degenerative change and spurring is also present.    PMFS History: Patient Active Problem List   Diagnosis Date Noted  . Presence of right artificial knee joint 12/17/2016  . Pain in left ankle and joints of left foot 12/17/2016  . At risk for adverse drug event  07/28/2016  . Primary osteoarthritis of right knee 05/27/2016  . Upper GI bleed 02/18/2015  . Benign essential HTN 02/18/2015  . Anemia of chronic disease 02/18/2015  . Acute alcohol intoxication (HCC) 02/18/2015  . Pulmonary nodule 02/18/2015  . Metabolic acidosis 02/18/2015  . Alcohol dependence with intoxication with complication (HCC)   . Major depressive disorder, recurrent episode, moderate (HCC)   . Cocaine abuse 03/02/2014  . History of schizophrenia 03/01/2014  . Tobacco abuse 12/06/2013  . Hematemesis 12/04/2013   Past Medical History:  Diagnosis Date  . Anxiety   . Arthritis   . Asthma     . GERD (gastroesophageal reflux disease)   . Pneumonia    hx  . Pre-diabetes   . Schizophrenia (HCC)   . Stroke [facility])    2009-or10    Family History  Problem Relation Age of Onset  . Adopted: Yes    Past Surgical History:  Procedure Laterality Date  . ESOPHAGOGASTRODUODENOSCOPY Left 12/05/2013   Procedure: ESOPHAGOGASTRODUODENOSCOPY (EGD);  Surgeon: Willis Modena, MD;  Location: [facility];  Service: Endoscopy;  Laterality: Left;  . ESOPHAGOGASTRODUODENOSCOPY (EGD) WITH PROPOFOL N/A 02/20/2015   Procedure: ESOPHAGOGASTRODUODENOSCOPY (EGD) WITH PROPOFOL;  Surgeon: Charlott Rakes, MD;  Location: [facility];  Service: Endoscopy;  Laterality: N/A;  . TOTAL KNEE ARTHROPLASTY Right 07/21/2016   Procedure: TOTAL KNEE ARTHROPLASTY;  Surgeon: Cammy Copa, MD;  Location: [facility] OR;  Service: Orthopedics;  Laterality: Right;   Social History   Occupational History  . Not on file.   Social History Main Topics  . Smoking status: Heavy Tobacco Smoker    Packs/day: 0.50    Years: 13.00    Types: Cigarettes  . Smokeless tobacco: Never Used     Comment: onset age 60; upto 1.5 ppd  . Alcohol use No     Comment: quit 2015; formerly up to 2 fifths/day  . Drug use: No  . Sexual activity: Yes

## 2016-12-18 ENCOUNTER — Ambulatory Visit: Payer: Self-pay | Admitting: Family Medicine

## 2016-12-21 ENCOUNTER — Telehealth (INDEPENDENT_AMBULATORY_CARE_PROVIDER_SITE_OTHER): Payer: Self-pay

## 2016-12-21 NOTE — Telephone Encounter (Signed)
IC advised patient feels something terrible is wrong with knee. Wanted to know what you expect him to do about his pain. Nothing is helping and he cannot live like this. Wants to know what you expect of him. Please advise. Thanks.

## 2016-12-21 NOTE — Telephone Encounter (Signed)
Please advise 

## 2016-12-21 NOTE — Telephone Encounter (Signed)
n

## 2016-12-21 NOTE — Telephone Encounter (Signed)
Patient called stating that Tramadol is not working and would like another Rx.  Cb# is [phone].  Thank You.  Please advise.

## 2016-12-21 NOTE — Telephone Encounter (Signed)
We can do any stronger pain medicine that.  He can't really continue with opioid pain medicine.  I think the knee functionally he looks very good no evidence of infection x-rays look perfect range of motion excellent pain origin unexplained but no narcotics indicated.  If you want to try pain management we can arrange that that as far as more narcotics ago that something we want to hold off on for now

## 2016-12-22 NOTE — Telephone Encounter (Signed)
IC advised. He will think about it and let us know if he would like to proceed with pain mgmt referral.

## 2016-12-23 ENCOUNTER — Telehealth (INDEPENDENT_AMBULATORY_CARE_PROVIDER_SITE_OTHER): Payer: Self-pay | Admitting: Orthopedic Surgery

## 2016-12-23 DIAGNOSIS — G894 Chronic pain syndrome: Secondary | ICD-10-CM

## 2016-12-23 NOTE — Telephone Encounter (Signed)
Patient called asking if he could be referred to Dr. Mirna Mires at [facility] for Pain Management. Their contact # is [phone]

## 2016-12-23 NOTE — Telephone Encounter (Signed)
Referral entered for patient. LM advising would be called with further information.

## 2016-12-23 NOTE — Addendum Note (Signed)
Addended byLaurann Montana on: 12/23/2016 05:51 PM   Modules accepted: Orders

## 2016-12-24 ENCOUNTER — Telehealth (INDEPENDENT_AMBULATORY_CARE_PROVIDER_SITE_OTHER): Payer: Self-pay | Admitting: Orthopedic Surgery

## 2016-12-24 NOTE — Telephone Encounter (Signed)
error 

## 2016-12-25 ENCOUNTER — Ambulatory Visit: Payer: Self-pay | Admitting: Family Medicine

## 2016-12-25 NOTE — Progress Notes (Deleted)
Subjective:    Patient ID: [name] , male   DOB: Nov 03, 1965 , 51 y.o..   MRN: [id number]  HPI  [name] is here for No chief complaint on file.   1. ***  Review of Systems: Per HPI. All other systems reviewed and are negative.  Health Maintenance Due  Topic Date Due  . FOOT EXAM  08/07/1975  . OPHTHALMOLOGY EXAM  08/07/1975  . TETANUS/TDAP  08/06/1984  . COLONOSCOPY  08/07/2015  . INFLUENZA VACCINE  10/28/2016    Past Medical History: Patient Active Problem List   Diagnosis Date Noted  . Presence of right artificial knee joint 12/17/2016  . Pain in left ankle and joints of left foot 12/17/2016  . At risk for adverse drug event 07/28/2016  . Primary osteoarthritis of right knee 05/27/2016  . Upper GI bleed 02/18/2015  . Benign essential HTN 02/18/2015  . Anemia of chronic disease 02/18/2015  . Acute alcohol intoxication (HCC) 02/18/2015  . Pulmonary nodule 02/18/2015  . Metabolic acidosis 02/18/2015  . Alcohol dependence with intoxication with complication (HCC)   . Major depressive disorder, recurrent episode, moderate (HCC)   . Cocaine abuse 03/02/2014  . History of schizophrenia 03/01/2014  . Tobacco abuse 12/06/2013  . Hematemesis 12/04/2013    Medications: reviewed and updated Current Outpatient Prescriptions  Medication Sig Dispense Refill  . acetaminophen-codeine (TYLENOL #3) 300-30 MG tablet Take 1 tablet by mouth every 8 (eight) hours as needed for moderate pain. 40 tablet 0  . acetaminophen-codeine (TYLENOL #3) 300-30 MG tablet Take 1 tablet by mouth 2 (two) times daily as needed for moderate pain. 10 tablet 0  . albuterol (PROVENTIL HFA;VENTOLIN HFA) 108 (90 BASE) MCG/ACT inhaler Inhale 1 puff into the lungs 2 (two) times daily as needed for wheezing or shortness of breath.    Marland Kitchen amLODipine (NORVASC) 2.5 MG tablet Take 1 tablet (2.5 mg total) by mouth daily. 30 tablet 0  . benztropine (COGENTIN) 1 MG tablet Take 1 tablet (1 mg total) by mouth  daily. 30 tablet 0  . buPROPion (WELLBUTRIN XL) 150 MG 24 hr tablet Take 1 tablet (150 mg total) by mouth daily. 30 tablet 0  . docusate sodium (COLACE) 100 MG capsule Take 1 capsule (100 mg total) by mouth 2 (two) times daily. 10 capsule 0  . folic acid (FOLVITE) 1 MG tablet Take 1 tablet (1 mg total) by mouth daily. For low folate 30 tablet 0  . losartan (COZAAR) 50 MG tablet TAKE 1 TABLET(50 MG) BY MOUTH DAILY 30 tablet 0  . metFORMIN (GLUCOPHAGE-XR) 500 MG 24 hr tablet Take 1 tablet (500 mg total) by mouth daily with breakfast. D/c'd >yr  a1c ok 30 tablet 0  . methocarbamol (ROBAXIN) 500 MG tablet Take 1 tablet (500 mg total) by mouth every 6 (six) hours as needed for muscle spasms. 30 tablet 0  . mirtazapine (REMERON) 15 MG tablet Take 1 tablet (15 mg total) by mouth at bedtime. 30 tablet 0  . oxyCODONE (OXY IR/ROXICODONE) 5 MG immediate release tablet Take 1 tablet (5 mg total) by mouth every 12 (twelve) hours as needed for severe pain. 30 tablet 0  . pantoprazole (PROTONIX) 40 MG tablet Take 1 tablet (40 mg total) by mouth 2 (two) times daily. 60 tablet 0  . pindolol (VISKEN) 5 MG tablet Take 1 tablet (5 mg total) by mouth 2 (two) times daily. For high blood pressure 60 tablet 0  . rivaroxaban (XARELTO) 10 MG TABS tablet  Take 1 tablet (10 mg total) by mouth daily with breakfast. 30 tablet 0  . sucralfate (CARAFATE) 1 g tablet Take 1 tablet (1 g total) by mouth 4 (four) times daily. For acid reflux 120 tablet 0  . traMADol (ULTRAM) 50 MG tablet Take 1 tablet (50 mg total) by mouth every 8 (eight) hours as needed. 60 tablet 0  . valsartan (DIOVAN) 160 MG tablet Take 1 tablet (160 mg total) by mouth daily. 30 tablet 0   No current facility-administered medications for this visit.     Social Hx:  reports that he has been smoking Cigarettes.  He has a 6.50 pack-year smoking history. He has never used smokeless tobacco.   Objective:   There were no vitals taken for this visit. Physical  Exam  Gen: NAD, alert, cooperative with exam, well-appearing HEENT: NCAT, PERRL, clear conjunctiva, oropharynx clear, supple neck Cardiac: Regular rate and rhythm, normal S1/S2, no murmur, no edema, capillary refill brisk  Respiratory: Clear to auscultation bilaterally, no wheezes, non-labored breathing Gastrointestinal: soft, non tender, non distended, bowel sounds present Skin: no rashes, normal turgor  Neurological: no gross deficits.  Psych: good insight, normal mood and affect  Assessment & Plan:  No problem-specific Assessment & Plan notes found for this encounter.  No orders of the defined types were placed in this encounter.  No orders of the defined types were placed in this encounter.   Anders Simmonds, MD [facility], PGY-3

## 2016-12-28 ENCOUNTER — Ambulatory Visit (INDEPENDENT_AMBULATORY_CARE_PROVIDER_SITE_OTHER): Payer: Medicare Other | Admitting: Orthopedic Surgery

## 2016-12-30 ENCOUNTER — Other Ambulatory Visit: Payer: Self-pay | Admitting: Family Medicine

## 2016-12-30 ENCOUNTER — Other Ambulatory Visit: Payer: Self-pay | Admitting: Nurse Practitioner

## 2016-12-30 DIAGNOSIS — Z8659 Personal history of other mental and behavioral disorders: Secondary | ICD-10-CM

## 2016-12-30 DIAGNOSIS — I10 Essential (primary) hypertension: Secondary | ICD-10-CM

## 2016-12-30 DIAGNOSIS — F331 Major depressive disorder, recurrent, moderate: Secondary | ICD-10-CM

## 2016-12-30 DIAGNOSIS — K219 Gastro-esophageal reflux disease without esophagitis: Secondary | ICD-10-CM

## 2016-12-31 ENCOUNTER — Ambulatory Visit (INDEPENDENT_AMBULATORY_CARE_PROVIDER_SITE_OTHER): Payer: Medicare Other | Admitting: Orthopedic Surgery

## 2017-01-06 ENCOUNTER — Encounter: Payer: Self-pay | Admitting: Family Medicine

## 2017-01-07 ENCOUNTER — Ambulatory Visit (INDEPENDENT_AMBULATORY_CARE_PROVIDER_SITE_OTHER): Payer: Medicare Other | Admitting: Orthopedic Surgery

## 2017-01-07 DIAGNOSIS — M25572 Pain in left ankle and joints of left foot: Secondary | ICD-10-CM | POA: Diagnosis not present

## 2017-01-07 DIAGNOSIS — M19172 Post-traumatic osteoarthritis, left ankle and foot: Secondary | ICD-10-CM | POA: Diagnosis not present

## 2017-01-07 NOTE — Progress Notes (Deleted)
Subjective:    Patient ID: [name] , male   DOB: Aug 21, 1965 , 51 y.o..   MRN: [id number]  HPI  [name] is a 51 yo M with PMH of stroke, GERD, arthritis, asthma, anxiety, schizophrenia, and substance abuse (cocaine and opioids) here for No chief complaint on file.   1. ***  Review of Systems: Per HPI. All other systems reviewed and are negative.  Health Maintenance Due  Topic Date Due  . FOOT EXAM  08/07/1975  . OPHTHALMOLOGY EXAM  08/07/1975  . TETANUS/TDAP  08/06/1984  . COLONOSCOPY  08/07/2015  . INFLUENZA VACCINE  10/28/2016    Past Medical History: Patient Active Problem List   Diagnosis Date Noted  . Post-traumatic osteoarthritis, left ankle and foot 01/07/2017  . Presence of right artificial knee joint 12/17/2016  . Pain in left ankle and joints of left foot 12/17/2016  . At risk for adverse drug event 07/28/2016  . Primary osteoarthritis of right knee 05/27/2016  . Upper GI bleed 02/18/2015  . Benign essential HTN 02/18/2015  . Anemia of chronic disease 02/18/2015  . Acute alcohol intoxication (HCC) 02/18/2015  . Pulmonary nodule 02/18/2015  . Metabolic acidosis 02/18/2015  . Alcohol dependence with intoxication with complication (HCC)   . Major depressive disorder, recurrent episode, moderate (HCC)   . Cocaine abuse (HCC) 03/02/2014  . History of schizophrenia 03/01/2014  . Tobacco abuse 12/06/2013  . Hematemesis 12/04/2013    Medications: reviewed and updated Current Outpatient Prescriptions  Medication Sig Dispense Refill  . acetaminophen-codeine (TYLENOL #3) 300-30 MG tablet Take 1 tablet by mouth every 8 (eight) hours as needed for moderate pain. 40 tablet 0  . acetaminophen-codeine (TYLENOL #3) 300-30 MG tablet Take 1 tablet by mouth 2 (two) times daily as needed for moderate pain. 10 tablet 0  . albuterol (PROVENTIL HFA;VENTOLIN HFA) 108 (90 BASE) MCG/ACT inhaler Inhale 1 puff into the lungs 2 (two) times daily as needed for wheezing or  shortness of breath.    Marland Kitchen amLODipine (NORVASC) 2.5 MG tablet TAKE 1 TABLET(2.5 MG) BY MOUTH DAILY 30 tablet 0  . benztropine (COGENTIN) 1 MG tablet TAKE 1 TABLET(1 MG) BY MOUTH DAILY 30 tablet 0  . buPROPion (WELLBUTRIN XL) 150 MG 24 hr tablet TAKE 1 TABLET(150 MG) BY MOUTH DAILY 30 tablet 0  . docusate sodium (COLACE) 100 MG capsule Take 1 capsule (100 mg total) by mouth 2 (two) times daily. 10 capsule 0  . folic acid (FOLVITE) 1 MG tablet Take 1 tablet (1 mg total) by mouth daily. For low folate 30 tablet 0  . ibuprofen (ADVIL,MOTRIN) 200 MG tablet Take 200 mg by mouth every 6 (six) hours as needed.    Marland Kitchen losartan (COZAAR) 50 MG tablet TAKE 1 TABLET(50 MG) BY MOUTH DAILY 30 tablet 0  . metFORMIN (GLUCOPHAGE-XR) 500 MG 24 hr tablet Take 1 tablet (500 mg total) by mouth daily with breakfast. D/c'd >yr  a1c ok 30 tablet 0  . methocarbamol (ROBAXIN) 500 MG tablet Take 1 tablet (500 mg total) by mouth every 6 (six) hours as needed for muscle spasms. 30 tablet 0  . mirtazapine (REMERON) 15 MG tablet Take 1 tablet (15 mg total) by mouth at bedtime. 30 tablet 0  . pantoprazole (PROTONIX) 40 MG tablet TAKE 1 TABLET(40 MG) BY MOUTH TWICE DAILY 60 tablet 0  . pindolol (VISKEN) 5 MG tablet Take 1 tablet (5 mg total) by mouth 2 (two) times daily. For high blood pressure 60 tablet  0  . rivaroxaban (XARELTO) 10 MG TABS tablet Take 1 tablet (10 mg total) by mouth daily with breakfast. 30 tablet 0  . sucralfate (CARAFATE) 1 g tablet TAKE 1 TABLET(1 GRAM) BY MOUTH FOUR TIMES DAILY FOR ACID REFLUX 120 tablet 0  . valsartan (DIOVAN) 160 MG tablet Take 1 tablet (160 mg total) by mouth daily. 30 tablet 0   No current facility-administered medications for this visit.     Social Hx:  reports that he has been smoking Cigarettes.  He has a 6.50 pack-year smoking history. He has never used smokeless tobacco.   Objective:   There were no vitals taken for this visit. Physical Exam  Gen: NAD, alert, cooperative with  exam, well-appearing HEENT: NCAT, PERRL, clear conjunctiva, oropharynx clear, supple neck Cardiac: Regular rate and rhythm, normal S1/S2, no murmur, no edema, capillary refill brisk  Respiratory: Clear to auscultation bilaterally, no wheezes, non-labored breathing Gastrointestinal: soft, non tender, non distended, bowel sounds present Skin: no rashes, normal turgor  Neurological: no gross deficits.  Psych: good insight, normal mood and affect  Assessment & Plan:  No problem-specific Assessment & Plan notes found for this encounter.  No orders of the defined types were placed in this encounter.  No orders of the defined types were placed in this encounter.   Anders Simmonds, MD [facility], PGY-3

## 2017-01-07 NOTE — Progress Notes (Signed)
Office Visit Note   Patient: [name]           Date of Birth: 02-09-1966           MRN: [id number] Visit Date: 01/07/2017              Requested by: Lovena Neighbours, MD [address] [place], [place] [address] PCP: Lovena Neighbours, MD  Chief Complaint  Patient presents with  . Left Ankle - Pain      HPI: Patient is a 51 year old gentleman with posttraumatic osteoarthritis left ankle. He is status post total knee arthroplasty. Patient states that he has chronic pain in the left ankle and would like to proceed with surgical intervention.  Assessment & Plan: Visit Diagnoses:  1. Pain in left ankle and joints of left foot   2. Post-traumatic osteoarthritis, left ankle and foot     Plan: Discussed ankle fusion versus total ankle arthroplasty. Discussed that the fusion would be more stable lower risk. Discussed that with a total ankle arthroplasty if this became infected he could potentially require a below-knee amputation. Patient states he understands the risks and benefits and would like to proceed with a total ankle arthroplasty.  Follow-Up Instructions: Return in about 2 weeks (around 01/21/2017).   Ortho Exam  Patient is alert, oriented, no adenopathy, well-dressed, normal affect, normal respiratory effort. Examination patient has an antalgic gait. His foot is plantar grade. He has a good dorsalis pedis pulse he has about 10 range of motion of the ankle and has pain with attempted range of motion. Radiographs shows complete collapse of the ankle joint calcification of the syndesmosis with subcondylar cysts  Imaging: No results found. No images are attached to the encounter.  Labs: Lab Results  Component Value Date   HGBA1C 5.6 11/05/2016   HGBA1C 6.0 (H) 07/10/2016   HGBA1C 5.8 04/10/2016   ESRSEDRATE 9 11/05/2016   ESRSEDRATE 7 10/26/2016   ESRSEDRATE 16 08/12/2016   CRP 21.9 (H) 11/05/2016   CRP 82.8 (H) 10/26/2016   LABURIC 5.2 04/17/2014   REPTSTATUS 07/12/2016 FINAL 07/10/2016   GRAMSTAIN Rare 10/29/2016   GRAMSTAIN WBC present-both PMN and Mononuclear 10/29/2016   GRAMSTAIN No Organisms Seen 10/29/2016   CULT 70,000 COLONIES/mL CITROBACTER KOSERI (A) 07/10/2016   LABORGA CITROBACTER KOSERI (A) 07/10/2016    Orders:  No orders of the defined types were placed in this encounter.  No orders of the defined types were placed in this encounter.    Procedures: No procedures performed  Clinical Data: No additional findings.  ROS:  All other systems negative, except as noted in the HPI. Review of Systems  Objective: Vital Signs: There were no vitals taken for this visit.  Specialty Comments:  No specialty comments available.  PMFS History: Patient Active Problem List   Diagnosis Date Noted  . Post-traumatic osteoarthritis, left ankle and foot 01/07/2017  . Presence of right artificial knee joint 12/17/2016  . Pain in left ankle and joints of left foot 12/17/2016  . At risk for adverse drug event 07/28/2016  . Primary osteoarthritis of right knee 05/27/2016  . Upper GI bleed 02/18/2015  . Benign essential HTN 02/18/2015  . Anemia of chronic disease 02/18/2015  . Acute alcohol intoxication (HCC) 02/18/2015  . Pulmonary nodule 02/18/2015  . Metabolic acidosis 02/18/2015  . Alcohol dependence with intoxication with complication (HCC)   . Major depressive disorder, recurrent episode, moderate (HCC)   . Cocaine abuse (HCC) 03/02/2014  . History of schizophrenia 03/01/2014  .  Tobacco abuse 12/06/2013  . Hematemesis 12/04/2013   Past Medical History:  Diagnosis Date  . Anxiety   . Arthritis   . Asthma   . GERD (gastroesophageal reflux disease)   . Pneumonia    hx  . Pre-diabetes   . Schizophrenia (HCC)   . Stroke [facility])    2009-or10    Family History  Problem Relation Age of Onset  . Adopted: Yes    Past Surgical History:  Procedure Laterality Date  . ESOPHAGOGASTRODUODENOSCOPY Left 12/05/2013    Procedure: ESOPHAGOGASTRODUODENOSCOPY (EGD);  Surgeon: Willis Modena, MD;  Location: [facility];  Service: Endoscopy;  Laterality: Left;  . ESOPHAGOGASTRODUODENOSCOPY (EGD) WITH PROPOFOL N/A 02/20/2015   Procedure: ESOPHAGOGASTRODUODENOSCOPY (EGD) WITH PROPOFOL;  Surgeon: Charlott Rakes, MD;  Location: [facility];  Service: Endoscopy;  Laterality: N/A;  . TOTAL KNEE ARTHROPLASTY Right 07/21/2016   Procedure: TOTAL KNEE ARTHROPLASTY;  Surgeon: Cammy Copa, MD;  Location: [facility] OR;  Service: Orthopedics;  Laterality: Right;   Social History   Occupational History  . Not on file.   Social History Main Topics  . Smoking status: Heavy Tobacco Smoker    Packs/day: 0.50    Years: 13.00    Types: Cigarettes  . Smokeless tobacco: Never Used     Comment: onset age 45; upto 1.5 ppd  . Alcohol use No     Comment: quit 2015; formerly up to 2 fifths/day  . Drug use: No  . Sexual activity: Yes

## 2017-01-08 ENCOUNTER — Ambulatory Visit: Payer: Medicare Other | Admitting: Family Medicine

## 2017-01-11 ENCOUNTER — Ambulatory Visit: Payer: Self-pay | Admitting: Family Medicine

## 2017-01-12 ENCOUNTER — Telehealth (INDEPENDENT_AMBULATORY_CARE_PROVIDER_SITE_OTHER): Payer: Self-pay | Admitting: Orthopedic Surgery

## 2017-01-12 NOTE — Telephone Encounter (Signed)
Patient called in to state that he has decide against surgery and wants infusion instead. Wants a call back to speak with someone about decision

## 2017-01-15 DIAGNOSIS — Z23 Encounter for immunization: Secondary | ICD-10-CM | POA: Diagnosis not present

## 2017-01-17 ENCOUNTER — Other Ambulatory Visit (INDEPENDENT_AMBULATORY_CARE_PROVIDER_SITE_OTHER): Payer: Self-pay | Admitting: Orthopedic Surgery

## 2017-01-18 NOTE — Telephone Encounter (Signed)
Ok to rf? 

## 2017-01-18 NOTE — Telephone Encounter (Signed)
Ok for 40 pls clal thx

## 2017-01-25 ENCOUNTER — Ambulatory Visit: Payer: Medicare Other | Admitting: Family Medicine

## 2017-01-26 ENCOUNTER — Other Ambulatory Visit: Payer: Self-pay | Admitting: Family Medicine

## 2017-02-06 ENCOUNTER — Other Ambulatory Visit: Payer: Self-pay | Admitting: Nurse Practitioner

## 2017-02-06 ENCOUNTER — Other Ambulatory Visit: Payer: Self-pay | Admitting: Family Medicine

## 2017-02-06 DIAGNOSIS — F331 Major depressive disorder, recurrent, moderate: Secondary | ICD-10-CM

## 2017-02-09 ENCOUNTER — Telehealth (INDEPENDENT_AMBULATORY_CARE_PROVIDER_SITE_OTHER): Payer: Self-pay | Admitting: Orthopedic Surgery

## 2017-02-09 NOTE — Telephone Encounter (Signed)
Patient called asked if Dr Sharol Given will write him a Rx for pain prior to surgery. Patient advised his left ankle is swollen and hurting. The number to contact patient is [phone]

## 2017-02-10 ENCOUNTER — Other Ambulatory Visit (INDEPENDENT_AMBULATORY_CARE_PROVIDER_SITE_OTHER): Payer: Self-pay | Admitting: Family

## 2017-02-10 MED ORDER — TRAMADOL HCL 50 MG PO TABS: 50.0000 mg | ORAL_TABLET | Freq: Two times a day (BID) | ORAL | 0 refills | Status: DC

## 2017-02-10 NOTE — Telephone Encounter (Signed)
Ha this pt called to sch surgery? He is asking for pain medication to get him through till surgery but I don't see anything?

## 2017-02-10 NOTE — Telephone Encounter (Signed)
Please see below and advise if you want to refill pain medication.

## 2017-02-10 NOTE — Telephone Encounter (Signed)
When I called him to schedule he said his girlfriend was having a procedure soon and he would have to wait until sometime in January to have his surgery.  He said he would call when he was ready to schedule.

## 2017-02-11 NOTE — Telephone Encounter (Signed)
Called patient advised Rx is at the front desk for pickup

## 2017-02-15 ENCOUNTER — Other Ambulatory Visit: Payer: Self-pay | Admitting: Family Medicine

## 2017-02-28 ENCOUNTER — Other Ambulatory Visit: Payer: Self-pay | Admitting: Nurse Practitioner

## 2017-02-28 ENCOUNTER — Other Ambulatory Visit: Payer: Self-pay | Admitting: Family Medicine

## 2017-02-28 DIAGNOSIS — F331 Major depressive disorder, recurrent, moderate: Secondary | ICD-10-CM

## 2017-02-28 DIAGNOSIS — Z8659 Personal history of other mental and behavioral disorders: Secondary | ICD-10-CM

## 2017-02-28 DIAGNOSIS — K219 Gastro-esophageal reflux disease without esophagitis: Secondary | ICD-10-CM

## 2017-02-28 DIAGNOSIS — I10 Essential (primary) hypertension: Secondary | ICD-10-CM

## 2017-03-02 ENCOUNTER — Other Ambulatory Visit: Payer: Self-pay | Admitting: Family Medicine

## 2017-03-02 DIAGNOSIS — F331 Major depressive disorder, recurrent, moderate: Secondary | ICD-10-CM

## 2017-03-02 DIAGNOSIS — Z8659 Personal history of other mental and behavioral disorders: Secondary | ICD-10-CM

## 2017-03-02 DIAGNOSIS — I10 Essential (primary) hypertension: Secondary | ICD-10-CM

## 2017-03-02 DIAGNOSIS — K219 Gastro-esophageal reflux disease without esophagitis: Secondary | ICD-10-CM

## 2017-03-04 ENCOUNTER — Ambulatory Visit (INDEPENDENT_AMBULATORY_CARE_PROVIDER_SITE_OTHER): Payer: Medicare Other | Admitting: Urgent Care

## 2017-03-04 ENCOUNTER — Encounter: Payer: Self-pay | Admitting: Urgent Care

## 2017-03-04 ENCOUNTER — Ambulatory Visit (INDEPENDENT_AMBULATORY_CARE_PROVIDER_SITE_OTHER): Payer: Medicare Other

## 2017-03-04 ENCOUNTER — Other Ambulatory Visit: Payer: Self-pay

## 2017-03-04 VITALS — BP 110/86 | HR 84 | Temp 97.6°F | Resp 16 | Ht 66.5 in | Wt 218.6 lb

## 2017-03-04 DIAGNOSIS — H7291 Unspecified perforation of tympanic membrane, right ear: Secondary | ICD-10-CM

## 2017-03-04 DIAGNOSIS — R062 Wheezing: Secondary | ICD-10-CM

## 2017-03-04 DIAGNOSIS — B9789 Other viral agents as the cause of diseases classified elsewhere: Secondary | ICD-10-CM

## 2017-03-04 DIAGNOSIS — S99912A Unspecified injury of left ankle, initial encounter: Secondary | ICD-10-CM

## 2017-03-04 DIAGNOSIS — H9211 Otorrhea, right ear: Secondary | ICD-10-CM | POA: Diagnosis not present

## 2017-03-04 DIAGNOSIS — J069 Acute upper respiratory infection, unspecified: Secondary | ICD-10-CM | POA: Diagnosis not present

## 2017-03-04 DIAGNOSIS — S0991XA Unspecified injury of ear, initial encounter: Secondary | ICD-10-CM

## 2017-03-04 DIAGNOSIS — R0602 Shortness of breath: Secondary | ICD-10-CM | POA: Diagnosis not present

## 2017-03-04 DIAGNOSIS — S93402A Sprain of unspecified ligament of left ankle, initial encounter: Secondary | ICD-10-CM | POA: Diagnosis not present

## 2017-03-04 MED ORDER — PSEUDOEPHEDRINE HCL ER 120 MG PO TB12: 120.0000 mg | ORAL_TABLET | Freq: Two times a day (BID) | ORAL | 3 refills | Status: DC

## 2017-03-04 MED ORDER — BENZONATATE 100 MG PO CAPS: 100.0000 mg | ORAL_CAPSULE | Freq: Three times a day (TID) | ORAL | 0 refills | Status: DC | PRN

## 2017-03-04 MED ORDER — CETIRIZINE HCL 10 MG PO TABS: 10.0000 mg | ORAL_TABLET | Freq: Every day | ORAL | 11 refills | Status: DC

## 2017-03-04 MED ORDER — NAPROXEN SODIUM 550 MG PO TABS: 550.0000 mg | ORAL_TABLET | Freq: Two times a day (BID) | ORAL | 1 refills | Status: DC

## 2017-03-04 MED ORDER — ALBUTEROL SULFATE HFA 108 (90 BASE) MCG/ACT IN AERS: 1.0000 | INHALATION_SPRAY | Freq: Two times a day (BID) | RESPIRATORY_TRACT | Status: DC | PRN

## 2017-03-04 NOTE — Patient Instructions (Addendum)
For sore throat try using a honey-based tea. Use 3 teaspoons of honey with juice squeezed from half lemon. Place shaved pieces of ginger into 1/2-1 cup of water and warm over stove top. Then mix the ingredients and repeat every 4 hours as needed.     Ankle Sprain An ankle sprain is a stretch or tear in one of the tough, fiber-like tissues (ligaments) in the ankle. The ligaments in your ankle help to hold the bones of the ankle together. What are the causes? This condition is often caused by stepping on or falling on the outer edge of the foot. What increases the risk? This condition is more likely to develop in people who play sports. What are the signs or symptoms? Symptoms of this condition include:  Pain in your ankle.  Swelling.  Bruising. Bruising may develop right after you sprain your ankle or 1-2 days later.  Trouble standing or walking, especially when you turn or change directions.  How is this diagnosed? This condition is diagnosed with a physical exam. During the exam, your health care provider will press on certain parts of your foot and ankle and try to move them in certain ways. X-rays may be taken to see how severe the sprain is and to check for broken bones. How is this treated? This condition may be treated with:  A brace. This is used to keep the ankle from moving until it heals.  An elastic bandage. This is used to support the ankle.  Crutches.  Pain medicine.  Surgery. This may be needed if the sprain is severe.  Physical therapy. This may help to improve the range of motion in the ankle.  Follow these instructions at home:  Rest your ankle.  Take over-the-counter and prescription medicines only as told by your health care provider.  For 2-3 days, keep your ankle raised (elevated) above the level of your heart as much as possible.  If directed, apply ice to the area: ? Put ice in a plastic bag. ? Place a towel between your skin and the  bag. ? Leave the ice on for 20 minutes, 2-3 times a day.  If you were given a brace: ? Wear it as directed. ? Remove it to shower or bathe. ? Try not to move your ankle much, but wiggle your toes from time to time. This helps to prevent swelling.  If you were given an elastic bandage (dressing): ? Remove it to shower or bathe. ? Try not to move your ankle much, but wiggle your toes from time to time. This helps to prevent swelling. ? Adjust the dressing to make it more comfortable if it feels too tight. ? Loosen the dressing if you have numbness or tingling in your foot, or if your foot becomes cold and blue.  If you have crutches, use them as told by your health care provider. Continue to use them until you can walk without feeling pain in your ankle. Contact a health care provider if:  You have rapidly increasing bruising or swelling.  Your pain is not relieved with medicine. Get help right away if:  Your toes or foot becomes numb or blue.  You have severe pain that gets worse. This information is not intended to replace advice given to you by your health care provider. Make sure you discuss any questions you have with your health care provider. Document Released: 03/16/2005 Document Revised: 07/24/2015 Document Reviewed: 10/16/2014 Elsevier Interactive Patient Education  2017 [employer].  Upper Respiratory Infection, Adult Most upper respiratory infections (URIs) are a viral infection of the air passages leading to the lungs. A URI affects the nose, throat, and upper air passages. The most common type of URI is nasopharyngitis and is typically referred to as "the common cold." URIs run their course and usually go away on their own. Most of the time, a URI does not require medical attention, but sometimes a bacterial infection in the upper airways can follow a viral infection. This is called a secondary infection. Sinus and middle ear infections are common types of secondary  upper respiratory infections. Bacterial pneumonia can also complicate a URI. A URI can worsen asthma and chronic obstructive pulmonary disease (COPD). Sometimes, these complications can require emergency medical care and may be life threatening. What are the causes? Almost all URIs are caused by viruses. A virus is a type of germ and can spread from one person to another. What increases the risk? You may be at risk for a URI if:  You smoke.  You have chronic heart or lung disease.  You have a weakened defense (immune) system.  You are very young or very old.  You have nasal allergies or asthma.  You work in crowded or poorly ventilated areas.  You work in health care facilities or schools.  What are the signs or symptoms? Symptoms typically develop 2-3 days after you come in contact with a cold virus. Most viral URIs last 7-10 days. However, viral URIs from the influenza virus (flu virus) can last 14-18 days and are typically more severe. Symptoms may include:  Runny or stuffy (congested) nose.  Sneezing.  Cough.  Sore throat.  Headache.  Fatigue.  Fever.  Loss of appetite.  Pain in your forehead, behind your eyes, and over your cheekbones (sinus pain).  Muscle aches.  How is this diagnosed? Your health care provider may diagnose a URI by:  Physical exam.  Tests to check that your symptoms are not due to another condition such as: ? Strep throat. ? Sinusitis. ? Pneumonia. ? Asthma.  How is this treated? A URI goes away on its own with time. It cannot be cured with medicines, but medicines may be prescribed or recommended to relieve symptoms. Medicines may help:  Reduce your fever.  Reduce your cough.  Relieve nasal congestion.  Follow these instructions at home:  Take medicines only as directed by your health care provider.  Gargle warm saltwater or take cough drops to comfort your throat as directed by your health care provider.  Use a warm mist  humidifier or inhale steam from a shower to increase air moisture. This may make it easier to breathe.  Drink enough fluid to keep your urine clear or pale yellow.  Eat soups and other clear broths and maintain good nutrition.  Rest as needed.  Return to work when your temperature has returned to normal or as your health care provider advises. You may need to stay home longer to avoid infecting others. You can also use a face mask and careful hand washing to prevent spread of the virus.  Increase the usage of your inhaler if you have asthma.  Do not use any tobacco products, including cigarettes, chewing tobacco, or electronic cigarettes. If you need help quitting, ask your health care provider. How is this prevented? The best way to protect yourself from getting a cold is to practice good hygiene.  Avoid oral or hand contact with people with cold symptoms.  Wash  your hands often if contact occurs.  There is no clear evidence that vitamin C, vitamin E, echinacea, or exercise reduces the chance of developing a cold. However, it is always recommended to get plenty of rest, exercise, and practice good nutrition. Contact a health care provider if:  You are getting worse rather than better.  Your symptoms are not controlled by medicine.  You have chills.  You have worsening shortness of breath.  You have brown or red mucus.  You have yellow or brown nasal discharge.  You have pain in your face, especially when you bend forward.  You have a fever.  You have swollen neck glands.  You have pain while swallowing.  You have white areas in the back of your throat. Get help right away if:  You have severe or persistent: ? Headache. ? Ear pain. ? Sinus pain. ? Chest pain.  You have chronic lung disease and any of the following: ? Wheezing. ? Prolonged cough. ? Coughing up blood. ? A change in your usual mucus.  You have a stiff neck.  You have changes in  your: ? Vision. ? Hearing. ? Thinking. ? Mood. This information is not intended to replace advice given to you by your health care provider. Make sure you discuss any questions you have with your health care provider. Document Released: 09/09/2000 Document Revised: 11/17/2015 Document Reviewed: 06/21/2013 Elsevier Interactive Patient Education  2017 [place] Drain[employer] Ear drainage is discharge of ear wax, pus, blood, or other fluids from the ear. Follow these instructions at home: Pay attention to any changes in your ear drainage. Take these actions to help with your condition:  Take over-the-counter and prescription medicines only as told by your health care provider.  Do not use cotton-tipped swabs in your ear. Do not put any other objects in your ear.  Do not swim until your health care provider has approved.  Before you shower, cover a cotton ball with petroleum jelly and place that in your ear. This helps to keep water out.  Avoid any exposure to smoke.  Wash your hands before and after you touch your ears.  Keep all follow-up visits as told by your health care provider. This is important.  Contact a health care provider if:  You have increased drainage.  You have ear pain.  You have a fever.  Your drainage is not getting better with treatment.  Your ear drainage is bloody, white, clear, or yellow.  Your ear is red or swollen. Get help right away if:  You have severe ear pain.  You have a severe headache.  You vomit.  You feel dizzy.  You have a seizure.  You have new hearing loss. This information is not intended to replace advice given to you by your health care provider. Make sure you discuss any questions you have with your health care provider. Document Released: 03/16/2005 Document Revised: 08/22/2015 Document Reviewed: 06/19/2014 Elsevier Interactive Patient Education  2018 [employer].    IF you received an x-ray today, you will  receive an invoice from [facility]. Please contact [facility] at [phone] with questions or concerns regarding your invoice.   IF you received labwork today, you will receive an invoice from [facility]. Please contact [facility] at [phone] with questions or concerns regarding your invoice.   Our billing staff will not be able to assist you with questions regarding bills from these companies.  You will be contacted with the lab results  as soon as they are available. The fastest way to get your results is to activate your My Chart account. Instructions are located on the last page of this paperwork. If you have not heard from [place] regarding the results in 2 weeks, please contact this office.

## 2017-03-04 NOTE — Progress Notes (Signed)
MRN: [id number] DOB: 11/18/1965  Subjective:   [name] is a 51 y.o. male presenting for multiple symptoms.  Ankle pain - Reports suffering a fall last week, rolled his ankle, felt a pop. Has been swelling, has severe pain. Patient has been icing it, taking Tylenol.   Right ear pain - Reports 1 week history of right ear pain, clear-yellow drainage. Symptoms started after he forcefully used a Q-tip to clean out his ear. Has had some decreased hearing. Denies fever, redness, swelling, pain.  Asthma - Reports 1 week history of productive cough, sinus drainage, wheezing, shob. Has been using his albuterol inhaler 3 times daily now. Has been using Afrin, Sudafed. Denies fever, sinus pain, chest pain. Smokes 1ppd. Needs a refill of his albuterol inhaler.  [name] has a current medication list which includes the following prescription(s): albuterol, bupropion, folic acid, ibuprofen, losartan, sucralfate, acetaminophen-codeine, acetaminophen-codeine, amlodipine, benztropine, docusate sodium, metformin, methocarbamol, mirtazapine, pantoprazole, pantoprazole, pindolol, rivaroxaban, tramadol, and valsartan. Also is allergic to aspirin.  [name]  has a past medical history of Anxiety, Arthritis, Asthma, GERD (gastroesophageal reflux disease), Pneumonia, Pre-diabetes, Schizophrenia (HCC), and Stroke (HCC). Also  has a past surgical history that includes Esophagogastroduodenoscopy (Left, 12/05/2013); Esophagogastroduodenoscopy (egd) with propofol (N/A, 02/20/2015); and Total knee arthroplasty (Right, 07/21/2016).  Objective:   Vitals: BP 110/86 (BP Location: Left Arm, Patient Position: Sitting, Cuff Size: Large)   Pulse 84   Temp 97.6 F (36.4 C) (Oral)   Resp 16   Ht 5' 6.5" (1.689 m)   Wt 218 lb 9.6 oz (99.2 kg)   SpO2 99%   BMI 34.75 kg/m   Physical Exam  Constitutional: He is oriented to person, place, and time. He appears well-developed and well-nourished.  HENT:  Right TM with small  perforation but no erythema, tragus tenderness, swelling. Throat with moderate post-nasal drainage.  Eyes: No scleral icterus.  Neck: Normal range of motion. Neck supple.  Cardiovascular: Normal rate, regular rhythm and intact distal pulses. Exam reveals no gallop and no friction rub.  No murmur heard. Pulmonary/Chest: Effort normal. No respiratory distress. He has no wheezes. He has no rales.  Musculoskeletal:       Left ankle: He exhibits decreased range of motion and swelling. He exhibits no ecchymosis, no deformity, no laceration and normal pulse. Tenderness. Lateral malleolus, medial malleolus and head of 5th metatarsal tenderness found. No AITFL, no CF ligament, no posterior TFL and no proximal fibula tenderness found. Achilles tendon exhibits no pain and no defect.  Lymphadenopathy:    He has no cervical adenopathy.  Neurological: He is alert and oriented to person, place, and time.  Skin: Skin is warm and dry.   Dg Ankle Complete Left  Result Date: 03/04/2017 CLINICAL DATA:  LEFT ankle injury, initial encounter. EXAM: LEFT ANKLE COMPLETE - 3+ VIEW COMPARISON:  06/22/2014. FINDINGS: Degenerative change at the tibiotalar articulation, with joint space narrowing and bony overgrowth. Well corticated bony densities adjacent to the lateral malleolus, represent old injury. Ossification or calcification of the tibia-fibular interosseous ligaments. Osteopenia. Plantar spur. No definite acute fracture. IMPRESSION: Chronic changes as described.  No acute fracture is evident. Electronically Signed   By: Elsie Stain M.D.   On: 03/04/2017 11:02    Assessment and Plan :   1. Sprain of left ankle, unspecified ligament, initial encounter 2. Injury of left ankle, initial encounter - Radiology report negative for fracture. Start Anaprox for pain and inflammation. Use ankle brace. RTC in 2-4 weeks if symptoms persist. He may  also f/u with his orthopedic surgeon.  3. Viral URI with cough 4. Wheezing 5.  Shortness of breath - Will use supportive care, refilled albuterol inhaler. Stop Afrin, switch to Zyrtec, Sudafed. Hydrate well. Hold off on smoking as much as possible. Follow up in 1 week if symptoms persist or worsen.  6. Perforation of right tympanic membrane 7. Drainage from right ear 8. Injury of right ear, initial encounter - Patient agreed to stop using Q-tips. Anticipatory guidance provided. Return-to-clinic precautions discussed, patient verbalized understanding. Consider referral to ENT if his symptoms persist or recheck if he develops symptoms of fever, swelling, ear pain, worsening drainage.  Wallis Bamberg, PA-C Primary Care at [facility] [phone] 03/04/2017  10:31 AM

## 2017-03-05 ENCOUNTER — Telehealth: Payer: Self-pay | Admitting: Family Medicine

## 2017-03-05 ENCOUNTER — Other Ambulatory Visit: Payer: Self-pay | Admitting: *Deleted

## 2017-03-05 MED ORDER — ALBUTEROL SULFATE HFA 108 (90 BASE) MCG/ACT IN AERS: 1.0000 | INHALATION_SPRAY | Freq: Two times a day (BID) | RESPIRATORY_TRACT | 1 refills | Status: DC | PRN

## 2017-03-05 NOTE — Telephone Encounter (Signed)
Copied from [place] [id number]. Topic: Quick Communication - Rx Refill/Question >> Mar 05, 2017  9:13 AM Carolyn Stare wrote: Has the patient contacted their pharmacy yes   showing it was process but pharmacy did not received   albuterol (PROVENTIL HFA;VENTOLIN HFA) 108 (90 Base) MCG/ACT inhaler  Pharmacy [place]   Agent: Please be advised that RX refills may take up to 3 business days. We ask that you follow-up with your pharmacy.

## 2017-03-05 NOTE — Telephone Encounter (Signed)
Medication refill resent.

## 2017-03-25 ENCOUNTER — Ambulatory Visit: Payer: Medicare Other | Admitting: Urgent Care

## 2017-04-02 ENCOUNTER — Ambulatory Visit: Payer: Medicare Other | Admitting: Urgent Care

## 2017-04-06 ENCOUNTER — Ambulatory Visit: Payer: Medicare Other | Admitting: Urgent Care

## 2017-04-08 ENCOUNTER — Ambulatory Visit (INDEPENDENT_AMBULATORY_CARE_PROVIDER_SITE_OTHER): Payer: Medicare Other | Admitting: Urgent Care

## 2017-04-08 ENCOUNTER — Encounter: Payer: Self-pay | Admitting: Urgent Care

## 2017-04-08 VITALS — BP 128/80 | HR 105 | Temp 97.9°F | Resp 20 | Ht 68.5 in | Wt 221.0 lb

## 2017-04-08 DIAGNOSIS — R05 Cough: Secondary | ICD-10-CM

## 2017-04-08 DIAGNOSIS — J329 Chronic sinusitis, unspecified: Secondary | ICD-10-CM

## 2017-04-08 DIAGNOSIS — H6690 Otitis media, unspecified, unspecified ear: Secondary | ICD-10-CM

## 2017-04-08 DIAGNOSIS — R0789 Other chest pain: Secondary | ICD-10-CM | POA: Diagnosis not present

## 2017-04-08 DIAGNOSIS — R059 Cough, unspecified: Secondary | ICD-10-CM

## 2017-04-08 MED ORDER — AMOXICILLIN 875 MG PO TABS: 875.0000 mg | ORAL_TABLET | Freq: Two times a day (BID) | ORAL | 0 refills | Status: DC

## 2017-04-08 MED ORDER — PREDNISONE 20 MG PO TABS: ORAL_TABLET | ORAL | 0 refills | Status: DC

## 2017-04-08 MED ORDER — IPRATROPIUM BROMIDE 0.03 % NA SOLN: 2.0000 | Freq: Two times a day (BID) | NASAL | 0 refills | Status: DC

## 2017-04-08 NOTE — Progress Notes (Signed)
MRN: [id number] DOB: 1965-11-21  Subjective:   [name] is a 52 y.o. male presenting for several week history of persistent sinus pain, sinus congestion, post-nasal drainage, mild sore throat, productive cough, chest tightness, subjective fever, right ear pain, right ear drainage. Has been using NyQuil, Robitussin DM. Is also using inhaler twice daily. Patient is still smoking cigarettes. Denies chest pain. Last OV for the same was 03/04/2017.  [name] has a current medication list which includes the following prescription(s): albuterol, bupropion, cetirizine, folic acid, losartan, naproxen sodium, sucralfate, and pseudoephedrine. Also has No Known Allergies.  [name]  has a past medical history of Anxiety, Arthritis, Asthma, GERD (gastroesophageal reflux disease), Pneumonia, Pre-diabetes, Schizophrenia (HCC), and Stroke (HCC). Also  has a past surgical history that includes Esophagogastroduodenoscopy (Left, 12/05/2013); Esophagogastroduodenoscopy (egd) with propofol (N/A, 02/20/2015); and Total knee arthroplasty (Right, 07/21/2016).  Objective:   Vitals: BP 128/80   Pulse (!) 105   Temp 97.9 F (36.6 C) (Oral)   Resp 20   Ht 5' 8.5" (1.74 m)   Wt 221 lb (100.2 kg)   SpO2 97%   BMI 33.11 kg/m   Physical Exam  Constitutional: He is oriented to person, place, and time. He appears well-developed and well-nourished.  HENT:  Right TM with an effusion, yellow drainage, TM's intact bilaterally, no effusions or erythema. Nasal turbinates erythematous, nasal passages minimally patent with thick mucus. Bilateral maxillary (R>L), frontal sinus tenderness. Oropharynx with significant post-nasal drainage, mucous membranes moist.   Eyes: Right eye exhibits no discharge. Left eye exhibits no discharge.  Neck: Normal range of motion. Neck supple.  Cardiovascular: Normal rate, regular rhythm and intact distal pulses. Exam reveals no gallop and no friction rub.  No murmur heard. Pulmonary/Chest: No  respiratory distress. He has no wheezes. He has no rales.  Lymphadenopathy:    He has no cervical adenopathy.  Neurological: He is alert and oriented to person, place, and time.  Skin: Skin is warm and dry.  Psychiatric: He has a normal mood and affect.   Assessment and Plan :   Acute otitis media, unspecified otitis media type  Sinusitis, unspecified chronicity, unspecified location  Cough  Chest tightness   Start amoxicillin to address otitis media and concurrent sinusitis. Short steroid course will be used for chronic rhinitis, cough, chest tightness. Return-to-clinic precautions discussed, patient verbalized understanding.   Wallis Bamberg, PA-C Primary Care at [facility] [phone] 04/08/2017  10:29 AM

## 2017-04-08 NOTE — Patient Instructions (Addendum)
Otitis Media, Adult Otitis media occurs when there is inflammation and fluid in the middle ear. Your middle ear is a part of the ear that contains bones for hearing as well as air that helps send sounds to your brain. What are the causes? This condition is caused by a blockage in the eustachian tube. This tube drains fluid from the ear to the back of the nose (nasopharynx). A blockage in this tube can be caused by an object or by swelling (edema) in the tube. Problems that can cause a blockage include:  A cold or other upper respiratory infection.  Allergies.  An irritant, such as tobacco smoke.  Enlarged adenoids. The adenoids are areas of soft tissue located high in the back of the throat, behind the nose and the roof of the mouth.  A mass in the nasopharynx.  Damage to the ear caused by pressure changes (barotrauma). What are the signs or symptoms? Symptoms of this condition include:  Ear pain.  A fever.  Decreased hearing.  A headache.  Tiredness (lethargy).  Fluid leaking from the ear.  Ringing in the ear. How is this diagnosed? This condition is diagnosed with a physical exam. During the exam your health care provider will use an instrument called an otoscope to look into your ear and check for redness, swelling, and fluid. He or she will also ask about your symptoms. Your health care provider may also order tests, such as:  A test to check the movement of the eardrum (pneumatic otoscopy). This test is done by squeezing a small amount of air into the ear.  A test that changes air pressure in the middle ear to check how well the eardrum moves and whether the eustachian tube is working (tympanogram). How is this treated? This condition usually goes away on its own within 3-5 days. But if the condition is caused by a bacteria infection and does not go away own its own, or keeps coming back, your health care provider may:  Prescribe antibiotic medicines to treat the  infection.  Prescribe or recommend medicines to control pain. Follow these instructions at home:  Take over-the-counter and prescription medicines only as told by your health care provider.  If you were prescribed an antibiotic medicine, take it as told by your health care provider. Do not stop taking the antibiotic even if you start to feel better.  Keep all follow-up visits as told by your health care provider. This is important. Contact a health care provider if:  You have bleeding from your nose.  There is a lump on your neck.  You are not getting better in 5 days.  You feel worse instead of better. Get help right away if:  You have severe pain that is not controlled with medicine.  You have swelling, redness, or pain around your ear.  You have stiffness in your neck.  A part of your face is paralyzed.  The bone behind your ear (mastoid) is tender when you touch it.  You develop a severe headache. Summary  Otitis media is redness, soreness, and swelling of the middle ear.  This condition usually goes away on its own within 3-5 days.  If the problem does not go away in 3-5 days, your health care provider may prescribe or recommend medicines to treat your symptoms.  If you were prescribed an antibiotic medicine, take it as told by your health care provider. This information is not intended to replace advice given to you by your   to you by your health care provider. Make sure you discuss any questions you have with your health care provider. Document Released: 12/20/2003 Document Revised: 03/06/2016 Document Reviewed: 03/06/2016 Elsevier Interactive Patient Education  2018 [employer].    Sinusitis, Adult Sinusitis is soreness and inflammation of your sinuses. Sinuses are hollow spaces in the bones around your face. Your sinuses are located:  Around your eyes.  In the middle of your forehead.  Behind your nose.  In your cheekbones.  Your sinuses and nasal passages are  lined with a stringy fluid (mucus). Mucus normally drains out of your sinuses. When your nasal tissues become inflamed or swollen, the mucus can become trapped or blocked so air cannot flow through your sinuses. This allows bacteria, viruses, and funguses to grow, which leads to infection. Sinusitis can develop quickly and last for 7?10 days (acute) or for more than 12 weeks (chronic). Sinusitis often develops after a cold. What are the causes? This condition is caused by anything that creates swelling in the sinuses or stops mucus from draining, including:  Allergies.  Asthma.  Bacterial or viral infection.  Abnormally shaped bones between the nasal passages.  Nasal growths that contain mucus (nasal polyps).  Narrow sinus openings.  Pollutants, such as chemicals or irritants in the air.  A foreign object stuck in the nose.  A fungal infection. This is rare.  What increases the risk? The following factors may make you more likely to develop this condition:  Having allergies or asthma.  Having had a recent cold or respiratory tract infection.  Having structural deformities or blockages in your nose or sinuses.  Having a weak immune system.  Doing a lot of swimming or diving.  Overusing nasal sprays.  Smoking.  What are the signs or symptoms? The main symptoms of this condition are pain and a feeling of pressure around the affected sinuses. Other symptoms include:  Upper toothache.  Earache.  Headache.  Bad breath.  Decreased sense of smell and taste.  A cough that may get worse at night.  Fatigue.  Fever.  Thick drainage from your nose. The drainage is often green and it may contain pus (purulent).  Stuffy nose or congestion.  Postnasal drip. This is when extra mucus collects in the throat or back of the nose.  Swelling and warmth over the affected sinuses.  Sore throat.  Sensitivity to light.  How is this diagnosed? This condition is diagnosed  based on symptoms, a medical history, and a physical exam. To find out if your condition is acute or chronic, your health care provider may:  Look in your nose for signs of nasal polyps.  Tap over the affected sinus to check for signs of infection.  View the inside of your sinuses using an imaging device that has a light attached (endoscope).  If your health care provider suspects that you have chronic sinusitis, you may also:  Be tested for allergies.  Have a sample of mucus taken from your nose (nasal culture) and checked for bacteria.  Have a mucus sample examined to see if your sinusitis is related to an allergy.  If your sinusitis does not respond to treatment and it lasts longer than 8 weeks, you may have an MRI or CT scan to check your sinuses. These scans also help to determine how severe your infection is. In rare cases, a bone biopsy may be done to rule out more serious types of fungal sinus disease. How is this treated? Treatment  for sinusitis depends on the cause and whether your condition is chronic or acute. If a virus is causing your sinusitis, your symptoms will go away on their own within 10 days. You may be given medicines to relieve your symptoms, including:  Topical nasal decongestants. They shrink swollen nasal passages and let mucus drain from your sinuses.  Antihistamines. These drugs block inflammation that is triggered by allergies. This can help to ease swelling in your nose and sinuses.  Topical nasal corticosteroids. These are nasal sprays that ease inflammation and swelling in your nose and sinuses.  Nasal saline washes. These rinses can help to get rid of thick mucus in your nose.  If your condition is caused by bacteria, you will be given an antibiotic medicine. If your condition is caused by a fungus, you will be given an antifungal medicine. Surgery may be needed to correct underlying conditions, such as narrow nasal passages. Surgery may also be needed  to remove polyps. Follow these instructions at home: Medicines  Take, use, or apply over-the-counter and prescription medicines only as told by your health care provider. These may include nasal sprays.  If you were prescribed an antibiotic medicine, take it as told by your health care provider. Do not stop taking the antibiotic even if you start to feel better. Hydrate and Humidify  Drink enough water to keep your urine clear or pale yellow. Staying hydrated will help to thin your mucus.  Use a cool mist humidifier to keep the humidity level in your home above 50%.  Inhale steam for 10-15 minutes, 3-4 times a day or as told by your health care provider. You can do this in the bathroom while a hot shower is running.  Limit your exposure to cool or dry air. Rest  Rest as much as possible.  Sleep with your head raised (elevated).  Make sure to get enough sleep each night. General instructions  Apply a warm, moist washcloth to your face 3-4 times a day or as told by your health care provider. This will help with discomfort.  Wash your hands often with soap and water to reduce your exposure to viruses and other germs. If soap and water are not available, use hand sanitizer.  Do not smoke. Avoid being around people who are smoking (secondhand smoke).  Keep all follow-up visits as told by your health care provider. This is important. Contact a health care provider if:  You have a fever.  Your symptoms get worse.  Your symptoms do not improve within 10 days. Get help right away if:  You have a severe headache.  You have persistent vomiting.  You have pain or swelling around your face or eyes.  You have vision problems.  You develop confusion.  Your neck is stiff.  You have trouble breathing. This information is not intended to replace advice given to you by your health care provider. Make sure you discuss any questions you have with your health care provider. Document  Released: 03/16/2005 Document Revised: 11/10/2015 Document Reviewed: 01/09/2015 Elsevier Interactive Patient Education  2018 [employer].   IF you received an x-ray today, you will receive an invoice from [facility]. Please contact [facility] at [phone] with questions or concerns regarding your invoice.   IF you received labwork today, you will receive an invoice from [facility]. Please contact [facility] at [phone] with questions or concerns regarding your invoice.   Our billing staff will not be able to assist you with questions regarding bills  from these companies.  You will be contacted with the lab results as soon as they are available. The fastest way to get your results is to activate your My Chart account. Instructions are located on the last page of this paperwork. If you have not heard from [place] regarding the results in 2 weeks, please contact this office.

## 2017-04-10 ENCOUNTER — Other Ambulatory Visit: Payer: Self-pay | Admitting: Family Medicine

## 2017-04-10 DIAGNOSIS — I10 Essential (primary) hypertension: Secondary | ICD-10-CM

## 2017-04-10 DIAGNOSIS — Z8659 Personal history of other mental and behavioral disorders: Secondary | ICD-10-CM

## 2017-04-23 ENCOUNTER — Telehealth: Payer: Self-pay | Admitting: Family Medicine

## 2017-04-23 ENCOUNTER — Ambulatory Visit: Payer: Self-pay | Admitting: Internal Medicine

## 2017-04-23 NOTE — Telephone Encounter (Signed)
Copied from [place] [phone]. Topic: Quick Communication - See Telephone Encounter >> Apr 23, 2017  1:09 PM Synthia Innocent wrote: CRM for notification. See Telephone encounter for:  Patient seen on 04/08/17, still having congestion, could something else be prescribed? [facility] 04/23/17.

## 2017-04-25 NOTE — Telephone Encounter (Signed)
The nasal spray for just nasal congestion (atrovent ordered 1/10) if he has not been using, but should return if his symptoms are not clearing.

## 2017-04-27 NOTE — Telephone Encounter (Signed)
Pt notified and verbalized understanding.

## 2017-04-27 NOTE — Telephone Encounter (Signed)
Pt stated the congestion is in his chest and he coughs up yellow phlegm, using robitussin DM and it's not helping the cough. Pls advise

## 2017-04-27 NOTE — Telephone Encounter (Signed)
Refer to previous note.

## 2017-04-28 ENCOUNTER — Ambulatory Visit (INDEPENDENT_AMBULATORY_CARE_PROVIDER_SITE_OTHER): Payer: Medicare Other | Admitting: Internal Medicine

## 2017-04-28 ENCOUNTER — Other Ambulatory Visit: Payer: Self-pay

## 2017-04-28 ENCOUNTER — Encounter: Payer: Self-pay | Admitting: Internal Medicine

## 2017-04-28 ENCOUNTER — Ambulatory Visit (HOSPITAL_COMMUNITY)
Admission: RE | Admit: 2017-04-28 | Discharge: 2017-04-28 | Disposition: A | Discharge status: Home / Self Care | Payer: Medicare Other | Source: Ambulatory Visit | Attending: Family Medicine | Admitting: Family Medicine

## 2017-04-28 VITALS — BP 128/78 | HR 130 | Temp 98.4°F | Wt 220.0 lb

## 2017-04-28 DIAGNOSIS — B9689 Other specified bacterial agents as the cause of diseases classified elsewhere: Secondary | ICD-10-CM | POA: Diagnosis not present

## 2017-04-28 DIAGNOSIS — R000 Tachycardia, unspecified: Secondary | ICD-10-CM

## 2017-04-28 DIAGNOSIS — J019 Acute sinusitis, unspecified: Secondary | ICD-10-CM | POA: Diagnosis not present

## 2017-04-28 DIAGNOSIS — R9431 Abnormal electrocardiogram [ECG] [EKG]: Secondary | ICD-10-CM | POA: Diagnosis not present

## 2017-04-28 MED ORDER — AMOXICILLIN-POT CLAVULANATE 875-125 MG PO TABS: 1.0000 | ORAL_TABLET | Freq: Two times a day (BID) | ORAL | 0 refills | Status: DC

## 2017-04-28 NOTE — Patient Instructions (Addendum)
It was so nice to meet you!  For your sinus issues- I have prescribed Augmentin, which is a stronger antibiotic. Please take 1 tablet twice a day for 10 days.  For your heart rate- please start decreasing the amount of coffee you drink each day. I would also stop taking the over the counter cough medication and only use the Albuterol as needed.  Please follow-up with Dr. Andy Gauss in the next 2 weeks for recheck of your heart rate.  -Dr. Brett Albino

## 2017-04-28 NOTE — Progress Notes (Signed)
   [facility] Phone: [phone]  Subjective:  [name] is a 52 year old male presenting to clinic for sinus congestion and pain for the last 4-5 weeks. Five weeks ago, he noticed that his right ear felt clogged. He was able to get some wax out, but then noticed that his ear started draining yellow fluid. He then noticed fluid draining down the back of his throat. He then developed runny nose, congestion, sneezing, and sinus pain. His right ear still feels clogged. He has been taking Nyquil and Dayquil in addition to two other OTC cough medications. He has also been using an OTC nasal spray which has been helping. No fevers, no chills. He was seen at an urgent care on 1/10 and was diagnosed with bacterial sinusitis. He was prescribed Amoxicillin x 7 days and Prednisone x 5 days. These medications helped his symptoms a lot, but his symptoms returned soon after stopping the medications.  Patient also noted to be tachycardic on exam to 160. He states he took Dayquil, two cough syrups, a nasal decongestant, Albuterol, and drank half a pot of coffee before coming the office. No chest pain, no palpitations, no dizziness, no lightheadedness. He states he does feel a little "jittery". No recent cocaine use.  ROS: See HPI for pertinent positives and negatives  Past Medical History- HTN, osteoarthritis, anemia of chronic disease, major depression  Family history reviewed for today's visit. No changes.  Social history- patient is a current smoker, also uses alcohol and cocaine.  Objective: BP 128/78   Pulse (!) 160   Temp 98.4 F (36.9 C) (Oral)   Wt 220 lb (99.8 kg)   SpO2 98%   BMI 32.96 kg/m  Gen: NAD, alert, cooperative with exam, appears mildly anxious. HEENT: NCAT, EOMI, MMM, TMs normal bilaterally, nasal turbinates edematous and erythematous, +tenderness to palpation of the sinuses, oropharynx erythematous without tonsillar exudate Neck: FROM, supple, no cervical  lymphadenopathy CV: Tachycardic, regular rhythm, no murmur Resp: CTABL, mild diffuse expiratory wheezes present, normal work of breathing GI: SNTND, BS present, no guarding or organomegaly Msk: No edema, warm, normal tone, moves UE/LE spontaneously  Assessment/Plan: Acute Bacterial Sinusitis: Has had 5 weeks of ear pressure, rhinorrhea, congestion, cough, and sinus pain. Treated with Amoxicillin on 1/10, which helped symptoms but they subsequently returned. - Treat with Augmentin x 10 days - Can use OTC nasal saline and Tylenol prn - Return precautions discussed - Follow-up if no improvement  Tachycardia: HR initially 160. BP 128/78. Patient denies any symptoms other than feeling "jittery". Previous HRs have all been in the 90s to low 100s. EKG performed in clinic showed sinus tachycardia. Likely due to ingesting a number of different substances that can cause tachycardia prior to coming to clinic- Dayquil, two different cough medications, OTC nasal decongestant, Albuterol, and 1/2 pot of coffee. - Patient monitored in clinic for close to an hour and HR came down to 130 on its own - Will order basic labs to rule out causes of tachycardia- CBC, CMP, TSH - Recommended that patient cut back on caffeine and stop the nasal decongestant and OTC cough medications. - Follow-up with PCP in clinic in 1-2 weeks for recheck of HR - Precepted with attending   Hyman Bible, MD PGY-3

## 2017-04-30 LAB — COMPREHENSIVE METABOLIC PANEL
ALT: 20 IU/L (ref 0–44)
AST: 60 IU/L — ABNORMAL HIGH (ref 0–40)
Albumin/Globulin Ratio: 1.5 (ref 1.2–2.2)
Albumin: 4.1 g/dL (ref 3.5–5.5)
Alkaline Phosphatase: 131 IU/L — ABNORMAL HIGH (ref 39–117)
BUN / CREAT RATIO: 3 — AB (ref 9–20)
BUN: 3 mg/dL — AB (ref 6–24)
Bilirubin Total: 0.2 mg/dL (ref 0.0–1.2)
CO2: 18 mmol/L — AB (ref 20–29)
CREATININE: 0.96 mg/dL (ref 0.76–1.27)
Calcium: 8.8 mg/dL (ref 8.7–10.2)
Chloride: 93 mmol/L — ABNORMAL LOW (ref 96–106)
GFR calc non Af Amer: 91 mL/min/{1.73_m2} (ref >59)
GFR, EST AFRICAN AMERICAN: 105 mL/min/{1.73_m2} (ref >59)
GLOBULIN, TOTAL: 2.8 g/dL (ref 1.5–4.5)
GLUCOSE: 140 mg/dL — AB (ref 65–99)
Potassium: 3.5 mmol/L (ref 3.5–5.2)
SODIUM: 131 mmol/L — AB (ref 134–144)
TOTAL PROTEIN: 6.9 g/dL (ref 6.0–8.5)

## 2017-04-30 LAB — CBC
HEMATOCRIT: 35.7 % — AB (ref 37.5–51.0)
HEMOGLOBIN: 12.1 g/dL — AB (ref 13.0–17.7)
MCH: 30.4 pg (ref 26.6–33.0)
MCHC: 33.9 g/dL (ref 31.5–35.7)
MCV: 90 fL (ref 79–97)
Platelets: 318 10*3/uL (ref 150–379)
RBC: 3.98 x10E6/uL — AB (ref 4.14–5.80)
RDW: 14.3 % (ref 12.3–15.4)
WBC: 21.5 10*3/uL (ref 3.4–10.8)

## 2017-04-30 LAB — TSH: TSH: 0.884 u[IU]/mL (ref 0.450–4.500)

## 2017-05-01 DIAGNOSIS — R000 Tachycardia, unspecified: Secondary | ICD-10-CM | POA: Insufficient documentation

## 2017-05-01 DIAGNOSIS — B9689 Other specified bacterial agents as the cause of diseases classified elsewhere: Secondary | ICD-10-CM | POA: Insufficient documentation

## 2017-05-01 DIAGNOSIS — J019 Acute sinusitis, unspecified: Secondary | ICD-10-CM

## 2017-05-01 NOTE — Assessment & Plan Note (Signed)
Has had 5 weeks of ear pressure, rhinorrhea, congestion, cough, and sinus pain. Treated with Amoxicillin on 1/10, which helped symptoms but they subsequently returned. - Treat with Augmentin x 10 days - Can use OTC nasal saline and Tylenol prn - Return precautions discussed - Follow-up if no improvement

## 2017-05-01 NOTE — Assessment & Plan Note (Addendum)
HR initially 160. BP 128/78. Patient denies any symptoms other than feeling "jittery". Previous HRs have all been in the 90s to low 100s. EKG performed in clinic showed sinus tachycardia. Likely due to ingesting a number of different substances that can cause tachycardia prior to coming to clinic- Dayquil, two different cough medications, OTC nasal decongestant, Albuterol, and 1/2 pot of coffee. - Patient monitored in clinic for close to an hour and HR came down to 130 on its own - Will order basic labs to rule out causes of tachycardia- CBC, CMP, TSH - Recommended that patient cut back on caffeine and stop the nasal decongestant and OTC cough medications. - Follow-up with PCP in clinic in 1-2 weeks for recheck of HR - Precepted with attending

## 2017-05-05 ENCOUNTER — Telehealth (INDEPENDENT_AMBULATORY_CARE_PROVIDER_SITE_OTHER): Payer: Self-pay | Admitting: Orthopedic Surgery

## 2017-05-05 NOTE — Telephone Encounter (Signed)
Patient called asking for a refill on his tramadol. CB# [phone]

## 2017-05-05 NOTE — Telephone Encounter (Signed)
I called and sw pt and advised that denial on refill for pain medication. This is something that can be filled once he has his surgery but could not give continual rx. Pt voiced understanding. Advised we are happy to see him in the office he can call and make an appt at any time

## 2017-05-10 ENCOUNTER — Other Ambulatory Visit: Payer: Self-pay | Admitting: Urgent Care

## 2017-05-14 ENCOUNTER — Encounter: Payer: Self-pay | Admitting: Family Medicine

## 2017-05-14 ENCOUNTER — Other Ambulatory Visit: Payer: Self-pay

## 2017-05-14 ENCOUNTER — Ambulatory Visit (INDEPENDENT_AMBULATORY_CARE_PROVIDER_SITE_OTHER): Payer: Medicare Other | Admitting: Family Medicine

## 2017-05-14 DIAGNOSIS — Z8659 Personal history of other mental and behavioral disorders: Secondary | ICD-10-CM | POA: Diagnosis not present

## 2017-05-14 DIAGNOSIS — I10 Essential (primary) hypertension: Secondary | ICD-10-CM

## 2017-05-14 DIAGNOSIS — F331 Major depressive disorder, recurrent, moderate: Secondary | ICD-10-CM

## 2017-05-14 MED ORDER — BUPROPION HCL ER (XL) 150 MG PO TB24: ORAL_TABLET | ORAL | 2 refills | Status: DC

## 2017-05-14 MED ORDER — FOLIC ACID 1 MG PO TABS: 1.0000 mg | ORAL_TABLET | Freq: Every day | ORAL | 0 refills | Status: DC

## 2017-05-14 MED ORDER — LOSARTAN POTASSIUM 50 MG PO TABS: ORAL_TABLET | ORAL | 2 refills | Status: DC

## 2017-05-14 MED ORDER — SUCRALFATE 1 G PO TABS: ORAL_TABLET | ORAL | 0 refills | Status: DC

## 2017-05-14 MED ORDER — ALBUTEROL SULFATE HFA 108 (90 BASE) MCG/ACT IN AERS: 1.0000 | INHALATION_SPRAY | Freq: Two times a day (BID) | RESPIRATORY_TRACT | 1 refills | Status: DC | PRN

## 2017-05-14 MED ORDER — AMLODIPINE BESYLATE 2.5 MG PO TABS: ORAL_TABLET | ORAL | 2 refills | Status: DC

## 2017-05-14 MED ORDER — CETIRIZINE HCL 10 MG PO TABS: 10.0000 mg | ORAL_TABLET | Freq: Every day | ORAL | 11 refills | Status: DC

## 2017-05-14 MED ORDER — NICOTINE 21 MG/24HR TD PT24: 21.0000 mg | MEDICATED_PATCH | Freq: Every day | TRANSDERMAL | 0 refills | Status: DC

## 2017-05-14 NOTE — Progress Notes (Signed)
Subjective:    Patient ID: [name], male    DOB: [birth year], 52 y.o.   MRN: [id number]   CC: Follow up for tachycardia  HPI: Patient is a  52 yo male who presents today to follow up on tachycardia at a recent office visit. Patient had a HR in the 160 at last OV which improved into the 130. BP was within normal limit. Patient was advised to decrease caffeine consumption, stop nasal decongestant and OTC cough medications. Patient reports that he has decreased his caffeine intake and stop using the nasal decongestant. Patient continue to use albuterol twice daily.  Patient denies any chest pain, abdominal pain, SOB, headaches, visual changes.  Smoking status reviewed   ROS: all other systems were reviewed and are negative other than in the HPI   Past Medical History:  Diagnosis Date  . Anxiety   . Arthritis   . Asthma   . GERD (gastroesophageal reflux disease)   . Pneumonia    hx  . Pre-diabetes   . Schizophrenia (HCC)   . Stroke [facility])    2009-or10    Past Surgical History:  Procedure Laterality Date  . ESOPHAGOGASTRODUODENOSCOPY Left 12/05/2013   Procedure: ESOPHAGOGASTRODUODENOSCOPY (EGD);  Surgeon: Willis Modena, MD;  Location: [facility];  Service: Endoscopy;  Laterality: Left;  . ESOPHAGOGASTRODUODENOSCOPY (EGD) WITH PROPOFOL N/A 02/20/2015   Procedure: ESOPHAGOGASTRODUODENOSCOPY (EGD) WITH PROPOFOL;  Surgeon: Charlott Rakes, MD;  Location: [facility] ENDOSCOPY;  Service: Endoscopy;  Laterality: N/A;  . TOTAL KNEE ARTHROPLASTY Right 07/21/2016   Procedure: TOTAL KNEE ARTHROPLASTY;  Surgeon: Cammy Copa, MD;  Location: [facility] OR;  Service: Orthopedics;  Laterality: Right;    Past medical history, surgical, family, and social history reviewed and updated in the EMR as appropriate.  Objective:  BP 138/84   Pulse (!) 116   Temp 98.2 F (36.8 C) (Oral)   Ht 5\' 9"  (1.753 m)   Wt 223 lb (101.2 kg)   SpO2 96%   BMI 32.93 kg/m   Vitals and nursing note  reviewed  General: NAD, pleasant, able to participate in exam Cardiac: RRR, normal heart sounds, no murmurs. 2+ radial and PT pulses bilaterally Respiratory: CTAB, normal effort, No wheezes, rales or rhonchi Abdomen: soft, nontender, nondistended, no hepatic or splenomegaly, +BS Extremities: no edema or cyanosis. WWP. Skin: warm and dry, no rashes noted Neuro: alert and oriented x4, no focal deficits Psych: Normal affect and mood   Assessment & Plan:   #Tachycardia, improving  Patient's HR today in the low 100's, much improved from last visit. Patient has made changes to decrease caffeine intake. He continue to use albuterol bid. He has also stopped using nasal spray and cough syrup. Tachycardia improving and likely secondary to stimulant. Will continue to monitor. In the past patient was able to have normal HR after completely discontinuing caffeine intake. Reiterated need to stop using albuterol on daily basis and only use it on a prn basis. Vitals are within normal limits.  --Patient will return in about a month for follow up  #Smoking Cessation Patient has decided he wanted to quit. He currently smoke 1/2 a pack a day. Patient has tried Chantix in the past unsuccessfully due to his history of schizophrenia. Patient requested nicotine patches. No quit date set yet. --Nicotine patch 21 g fora month --Will see patient in a month for follow up     Lovena Neighbours, MD [facility] PGY-2

## 2017-05-14 NOTE — Patient Instructions (Signed)
It was great seeing you today! We have addressed the following issues today  1. Your heart rate is better today, still higher than will like it to be. Continue to decrease your caffeine consumption, stop using your albuterol on a daily basis. 2. Make sure your take both of your blood pressure medications in the morning 3. I prescribe some nicotine patches for you, make sure you stop smoking.  If we did any lab work today, and the results require attention, either me or my nurse will get in touch with you. If everything is normal, you will get a letter in mail and a message via . If you don't hear from [place] in two weeks, please give [place] a call. Otherwise, we look forward to seeing you again at your next visit. If you have any questions or concerns before then, please call the clinic at [phone].  Please bring all your medications to every doctors visit  Sign up for My Chart to have easy access to your labs results, and communication with your Primary care physician. Please ask Front Desk for some assistance.   Please check-out at the front desk before leaving the clinic.    Take Care,   Dr. Andy Gauss

## 2017-05-24 ENCOUNTER — Other Ambulatory Visit: Payer: Self-pay | Admitting: Family Medicine

## 2017-05-25 ENCOUNTER — Telehealth: Payer: Self-pay | Admitting: *Deleted

## 2017-05-25 NOTE — Telephone Encounter (Signed)
Patient voiced understanding on instructions and will contact psych.  Devra Stare,CMA

## 2017-05-25 NOTE — Telephone Encounter (Signed)
-----   Message from Marjie Skiff, MD sent at 05/24/2017  2:44 PM EST ----- Jazmin,   I just refilled this patient's Benztropine for only a month instead of 5 refill requested. This is a psych med that should probably go to his psychiatrist. Please let the patient know that he need to change future refill to his other provider in charge of titrating that medication.  Thank you   Marjie Skiff, MD [place], PGY-2

## 2017-06-04 ENCOUNTER — Telehealth (INDEPENDENT_AMBULATORY_CARE_PROVIDER_SITE_OTHER): Payer: Self-pay | Admitting: Orthopedic Surgery

## 2017-06-04 NOTE — Telephone Encounter (Signed)
I called and sw pt's girl friend to advise that we are not able to state that the pt is incapable of navigating within the community without assistance be it because of developmental, cognitive or physical disability. Voiced understanding and will call with questions.

## 2017-06-04 NOTE — Telephone Encounter (Signed)
Patients girlfriend, [name] left a voicemail requesting that we call or send a letter or documentation to social services to give her permission to ride on Medicaid transportation to his appt on Tuesday. She gave # [phone] to call social services. Please advise if we can do this # [phone]

## 2017-06-08 ENCOUNTER — Ambulatory Visit (INDEPENDENT_AMBULATORY_CARE_PROVIDER_SITE_OTHER): Payer: Medicare Other | Admitting: Orthopedic Surgery

## 2017-06-14 ENCOUNTER — Ambulatory Visit (INDEPENDENT_AMBULATORY_CARE_PROVIDER_SITE_OTHER): Payer: Medicare Other | Admitting: Orthopedic Surgery

## 2017-06-14 ENCOUNTER — Ambulatory Visit: Payer: Self-pay | Admitting: Family Medicine

## 2017-06-14 ENCOUNTER — Encounter (INDEPENDENT_AMBULATORY_CARE_PROVIDER_SITE_OTHER): Payer: Self-pay | Admitting: Orthopedic Surgery

## 2017-06-14 VITALS — Ht 69.0 in | Wt 223.0 lb

## 2017-06-14 DIAGNOSIS — M19172 Post-traumatic osteoarthritis, left ankle and foot: Secondary | ICD-10-CM

## 2017-06-14 MED ORDER — TRAMADOL HCL 50 MG PO TABS: 50.0000 mg | ORAL_TABLET | Freq: Four times a day (QID) | ORAL | 0 refills | Status: DC | PRN

## 2017-06-14 NOTE — Progress Notes (Signed)
Office Visit Note   Patient: [name]           Date of Birth: [birth year]           MRN: [id number] Visit Date: 06/14/2017              Requested by: Lovena Neighbours, MD [address] [place], [place] [address] PCP: Lovena Neighbours, MD  Chief Complaint  Patient presents with  . Left Ankle - Pain      HPI: Patient is a 52 year old gentleman with traumatic arthritis of the left ankle.  FusionPatient states he would like to proceed with him about a month.  Patient states he currently has to care for someone with repeat lumbar spine surgery.  Assessment & Plan: Visit Diagnoses:  1. Post-traumatic osteoarthritis, left ankle and foot     Plan: Prescription provided for tramadol for pain continue with his cane he will call us when he is ready to schedule ankle fusion surgery on the left.  Follow-Up Instructions: Return in about 2 weeks (around 06/28/2017).   Ortho Exam  Patient is alert, oriented, no adenopathy, well-dressed, normal affect, normal respiratory effort. Examination patient has a good dorsalis pedis pulse he has valgus alignment of the hindfoot he has pain to palpation anteriorly over the ankle joint there is no pain with subtalar motion.  There is pain with plantarflexion and dorsiflexion of the ankle.  There is no redness no cellulitis no signs of infection.  Imaging: No results found. No images are attached to the encounter.  Labs: Lab Results  Component Value Date   HGBA1C 5.6 11/05/2016   HGBA1C 6.0 (H) 07/10/2016   HGBA1C 5.8 04/10/2016   ESRSEDRATE 9 11/05/2016   ESRSEDRATE 7 10/26/2016   ESRSEDRATE 16 08/12/2016   CRP 21.9 (H) 11/05/2016   CRP 82.8 (H) 10/26/2016   LABURIC 5.2 04/17/2014   REPTSTATUS 07/12/2016 FINAL 07/10/2016   GRAMSTAIN Rare 10/29/2016   GRAMSTAIN WBC present-both PMN and Mononuclear 10/29/2016   GRAMSTAIN No Organisms Seen 10/29/2016   CULT 70,000 COLONIES/mL CITROBACTER KOSERI (A) 07/10/2016   LABORGA CITROBACTER  KOSERI (A) 07/10/2016    @LABSALLVALUES (HGBA1)@  Body mass index is 32.93 kg/m.  Orders:  No orders of the defined types were placed in this encounter.  Meds ordered this encounter  Medications  . traMADol (ULTRAM) 50 MG tablet    Sig: Take 1 tablet (50 mg total) by mouth every 6 (six) hours as needed for moderate pain.    Dispense:  60 tablet    Refill:  0     Procedures: No procedures performed  Clinical Data: No additional findings.  ROS:  All other systems negative, except as noted in the HPI. Review of Systems  Objective: Vital Signs: Ht 5\' 9"  (1.753 m)   Wt 223 lb (101.2 kg)   BMI 32.93 kg/m   Specialty Comments:  No specialty comments available.  PMFS History: Patient Active Problem List   Diagnosis Date Noted  . Acute bacterial sinusitis 05/01/2017  . Tachycardia 05/01/2017  . Post-traumatic osteoarthritis, left ankle and foot 01/07/2017  . Presence of right artificial knee joint 12/17/2016  . Pain in left ankle and joints of left foot 12/17/2016  . At risk for adverse drug event 07/28/2016  . Primary osteoarthritis of right knee 05/27/2016  . Upper GI bleed 02/18/2015  . Benign essential HTN 02/18/2015  . Anemia of chronic disease 02/18/2015  . Pulmonary nodule 02/18/2015  . Alcohol dependence with intoxication with complication (HCC)   .  Major depressive disorder, recurrent episode, moderate (HCC)   . Cocaine abuse (HCC) 03/02/2014  . History of schizophrenia 03/01/2014  . Tobacco abuse 12/06/2013  . Hematemesis 12/04/2013   Past Medical History:  Diagnosis Date  . Anxiety   . Arthritis   . Asthma   . GERD (gastroesophageal reflux disease)   . Pneumonia    hx  . Pre-diabetes   . Schizophrenia (HCC)   . Stroke [facility])    2009-or10    Family History  Adopted: Yes    Past Surgical History:  Procedure Laterality Date  . ESOPHAGOGASTRODUODENOSCOPY Left 12/05/2013   Procedure: ESOPHAGOGASTRODUODENOSCOPY (EGD);  Surgeon: Willis Modena,  MD;  Location: [facility];  Service: Endoscopy;  Laterality: Left;  . ESOPHAGOGASTRODUODENOSCOPY (EGD) WITH PROPOFOL N/A 02/20/2015   Procedure: ESOPHAGOGASTRODUODENOSCOPY (EGD) WITH PROPOFOL;  Surgeon: Charlott Rakes, MD;  Location: [facility];  Service: Endoscopy;  Laterality: N/A;  . TOTAL KNEE ARTHROPLASTY Right 07/21/2016   Procedure: TOTAL KNEE ARTHROPLASTY;  Surgeon: Cammy Copa, MD;  Location: [facility] OR;  Service: Orthopedics;  Laterality: Right;   Social History   Occupational History  . Not on file  Tobacco Use  . Smoking status: Heavy Tobacco Smoker    Packs/day: 0.50    Years: 13.00    Pack years: 6.50    Types: Cigarettes  . Smokeless tobacco: Never Used  . Tobacco comment: onset age 65; upto 1.5 ppd  Substance and Sexual Activity  . Alcohol use: No    Alcohol/week: 0.0 oz    Comment: quit 2015; formerly up to 2 fifths/day  . Drug use: No  . Sexual activity: Yes

## 2017-06-21 ENCOUNTER — Ambulatory Visit: Payer: Medicare Other | Admitting: Family Medicine

## 2017-06-21 ENCOUNTER — Other Ambulatory Visit: Payer: Self-pay | Admitting: Family Medicine

## 2017-06-21 DIAGNOSIS — Z8659 Personal history of other mental and behavioral disorders: Secondary | ICD-10-CM

## 2017-06-21 MED ORDER — LOSARTAN POTASSIUM 50 MG PO TABS: ORAL_TABLET | ORAL | 2 refills | Status: DC

## 2017-06-25 ENCOUNTER — Telehealth: Payer: Self-pay | Admitting: Family Medicine

## 2017-06-25 NOTE — Telephone Encounter (Signed)
Prescriptions for Amlodipine, Benztropine, and Mirtazapine dated 12/02/16 were not picked up as of 06/25/17 and were shredded.

## 2017-06-29 ENCOUNTER — Encounter: Payer: Self-pay | Admitting: Family Medicine

## 2017-06-29 ENCOUNTER — Other Ambulatory Visit: Payer: Self-pay

## 2017-06-29 ENCOUNTER — Other Ambulatory Visit (INDEPENDENT_AMBULATORY_CARE_PROVIDER_SITE_OTHER): Payer: Self-pay

## 2017-06-29 ENCOUNTER — Telehealth (INDEPENDENT_AMBULATORY_CARE_PROVIDER_SITE_OTHER): Payer: Self-pay | Admitting: Orthopedic Surgery

## 2017-06-29 ENCOUNTER — Ambulatory Visit (INDEPENDENT_AMBULATORY_CARE_PROVIDER_SITE_OTHER): Payer: Medicare Other | Admitting: Family Medicine

## 2017-06-29 VITALS — BP 102/62 | HR 100 | Temp 98.5°F | Wt 213.0 lb

## 2017-06-29 DIAGNOSIS — Z720 Tobacco use: Secondary | ICD-10-CM | POA: Diagnosis not present

## 2017-06-29 DIAGNOSIS — M19172 Post-traumatic osteoarthritis, left ankle and foot: Secondary | ICD-10-CM

## 2017-06-29 DIAGNOSIS — Z1211 Encounter for screening for malignant neoplasm of colon: Secondary | ICD-10-CM | POA: Diagnosis not present

## 2017-06-29 DIAGNOSIS — I10 Essential (primary) hypertension: Secondary | ICD-10-CM | POA: Diagnosis not present

## 2017-06-29 DIAGNOSIS — R000 Tachycardia, unspecified: Secondary | ICD-10-CM | POA: Diagnosis not present

## 2017-06-29 DIAGNOSIS — F17210 Nicotine dependence, cigarettes, uncomplicated: Secondary | ICD-10-CM

## 2017-06-29 DIAGNOSIS — J302 Other seasonal allergic rhinitis: Secondary | ICD-10-CM

## 2017-06-29 DIAGNOSIS — C760 Malignant neoplasm of head, face and neck: Secondary | ICD-10-CM

## 2017-06-29 DIAGNOSIS — N289 Disorder of kidney and ureter, unspecified: Secondary | ICD-10-CM

## 2017-06-29 MED ORDER — TRAMADOL HCL 50 MG PO TABS: 50.0000 mg | ORAL_TABLET | Freq: Four times a day (QID) | ORAL | 0 refills | Status: DC | PRN

## 2017-06-29 MED ORDER — IBUPROFEN 600 MG PO TABS: 600.0000 mg | ORAL_TABLET | Freq: Four times a day (QID) | ORAL | 0 refills | Status: DC | PRN

## 2017-06-29 MED ORDER — FLUTICASONE PROPIONATE 50 MCG/ACT NA SUSP: 1.0000 | Freq: Every day | NASAL | 2 refills | Status: DC

## 2017-06-29 NOTE — Assessment & Plan Note (Addendum)
HR today is 100, much improved compared to last office visits.  Patient has made some lifestyle changes (see HPI) that have made a difference. No concerning finding on exam or ROS. Will continue to monitor.

## 2017-06-29 NOTE — Telephone Encounter (Signed)
Patient called stating that the tramadol isn't helping with his pain, he is taking ibuprofen and tylenol on top of the tramadol and he said it still isn't touching the pain. Just wanted to see if Dr. Sharol Given could prescribe him something else. Also wanted to schedule his surgery around the beginning of May if possible. CB # [phone]

## 2017-06-29 NOTE — Progress Notes (Signed)
Subjective:    Patient ID: [name], male    DOB: [birth year], 52 y.o.   MRN: [id number]   CC: Follow up for tachycardia and blood pressure   HPI: Patient is a 52 yo male who presents today to follow up on tachycardia and high blood pressure. Patient reports that he has cut down on his coffee drinking and is also trying to quit smoking. Patient has also stopped taking cough syrup. Patient denies any palpitations, HA, chest pain or SOB. Patient only complaints of allergies.  Smoking status reviewed   ROS: all other systems were reviewed and are negative other than in the HPI   Past Medical History:  Diagnosis Date  . Anxiety   . Arthritis   . Asthma   . GERD (gastroesophageal reflux disease)   . Pneumonia    hx  . Pre-diabetes   . Schizophrenia (HCC)   . Stroke [name])    2009-or10    Past Surgical History:  Procedure Laterality Date  . ESOPHAGOGASTRODUODENOSCOPY Left 12/05/2013   Procedure: ESOPHAGOGASTRODUODENOSCOPY (EGD);  Surgeon: Willis Modena, MD;  Location: [facility];  Service: Endoscopy;  Laterality: Left;  . ESOPHAGOGASTRODUODENOSCOPY (EGD) WITH PROPOFOL N/A 02/20/2015   Procedure: ESOPHAGOGASTRODUODENOSCOPY (EGD) WITH PROPOFOL;  Surgeon: Charlott Rakes, MD;  Location: [facility];  Service: Endoscopy;  Laterality: N/A;  . TOTAL KNEE ARTHROPLASTY Right 07/21/2016   Procedure: TOTAL KNEE ARTHROPLASTY;  Surgeon: Cammy Copa, MD;  Location: [facility] OR;  Service: Orthopedics;  Laterality: Right;    Past medical history, surgical, family, and social history reviewed and updated in the EMR as appropriate.  Objective:  BP 102/62   Pulse 100   Temp 98.5 F (36.9 C) (Oral)   Wt 213 lb (96.6 kg)   SpO2 98%   BMI 31.45 kg/m   Vitals and nursing note reviewed  General: NAD, pleasant, able to participate in exam Cardiac: RRR, normal heart sounds, no murmurs. 2+ radial and PT pulses bilaterally Respiratory: CTAB, normal effort, No wheezes, rales or  rhonchi Abdomen: soft, nontender, nondistended, no hepatic or splenomegaly, +BS Extremities: no edema or cyanosis. WWP. Skin: warm and dry, no rashes noted Neuro: alert and oriented x4, no focal deficits Psych: Normal affect and mood   Assessment & Plan:    Tachycardia HR today is 100, much improved compared to last office visits.  Patient has made some lifestyle changes (see HPI) that have made a difference. No concerning finding on exam or ROS. Will continue to monitor.  Benign essential HTN BP today was 102/62 much improved from previous office visit. Patient report that he has not been taking his losartan due to the recall. Discuss resuming BP oral agent. Encourage patient to be continue with lifestyle modifications.   Seasonal allergies Patient is complaining of seasonal allergies with some rhinorrhea and mild intermittent cough. Currently on Zyrtec 10 mg.  --Will prescribe Flonase and follow up on symptoms  #Abnormal CBC and BMP Patient with leukocytosis and abnormal liver enzymes back at the end of January. Will repeat today. --Follow up  On CBC, CMP   Patient has been schedule for a colonoscopy for colo cancer screening.    Brian Neighbours, MD [name] PGY-2

## 2017-06-29 NOTE — Telephone Encounter (Signed)
Called pt and advised we can not give anything stronger prior to surgery. Pt asked for refill on tramadol and this was ok per Dr. Sharol Given. Called into pharm and advised pt that I would send this message to cheryl and she would call pt in the next several days to let him know when in may he could schedule surgery

## 2017-06-29 NOTE — Patient Instructions (Signed)
It was great seeing you today! We have addressed the following issues today  1. Your blood pressure is normal today, same for your heart rate. Please continue to cut down on coffee and smoking. 2. I send ibuprofen to the pharmacy for your foot pain. Please only take as needed. It can cause stomach problems 3. Flonase was sent to your pharmacy for your allergies. 4. I refer you to GI for a colonoscopy and you can talk to them about your procedure they have done for you at [facility] two years ago.   If we did any lab work today, and the results require attention, either me or my nurse will get in touch with you. If everything is normal, you will get a letter in mail and a message via . If you don't hear from [place] in two weeks, please give [place] a call. Otherwise, we look forward to seeing you again at your next visit. If you have any questions or concerns before then, please call the clinic at [phone].  Please bring all your medications to every doctors visit  Sign up for My Chart to have easy access to your labs results, and communication with your Primary care physician. Please ask Front Desk for some assistance.   Please check-out at the front desk before leaving the clinic.    Take Care,   Dr. Andy Gauss

## 2017-06-29 NOTE — Assessment & Plan Note (Addendum)
BP today was 102/62 much improved from previous office visit. Patient report that he has not been taking his losartan due to the recall. Discuss resuming BP oral agent. Encourage patient to be continue with lifestyle modifications.

## 2017-06-29 NOTE — Assessment & Plan Note (Signed)
Patient is complaining of seasonal allergies with some rhinorrhea and mild intermittent cough. Currently on Zyrtec 10 mg.  --Will prescribe Flonase and follow up on symptoms

## 2017-06-30 ENCOUNTER — Encounter: Payer: Self-pay | Admitting: Internal Medicine

## 2017-06-30 LAB — CBC WITH DIFFERENTIAL/PLATELET
Basophils Absolute: 0 10*3/uL (ref 0.0–0.2)
Basos: 0 %
EOS (ABSOLUTE): 0.2 10*3/uL (ref 0.0–0.4)
Eos: 2 %
HEMATOCRIT: 38.4 % (ref 37.5–51.0)
Hemoglobin: 12.5 g/dL — ABNORMAL LOW (ref 13.0–17.7)
IMMATURE GRANULOCYTES: 0 %
Immature Grans (Abs): 0 10*3/uL (ref 0.0–0.1)
Lymphocytes Absolute: 1.6 10*3/uL (ref 0.7–3.1)
Lymphs: 21 %
MCH: 27.9 pg (ref 26.6–33.0)
MCHC: 32.6 g/dL (ref 31.5–35.7)
MCV: 86 fL (ref 79–97)
MONOS ABS: 0.6 10*3/uL (ref 0.1–0.9)
Monocytes: 7 %
NEUTROS PCT: 70 %
Neutrophils Absolute: 5.3 10*3/uL (ref 1.4–7.0)
PLATELETS: 281 10*3/uL (ref 150–379)
RBC: 4.48 x10E6/uL (ref 4.14–5.80)
RDW: 14.2 % (ref 12.3–15.4)
WBC: 7.6 10*3/uL (ref 3.4–10.8)

## 2017-06-30 LAB — CMP14+EGFR
A/G RATIO: 1.8 (ref 1.2–2.2)
ALBUMIN: 4.3 g/dL (ref 3.5–5.5)
ALK PHOS: 147 IU/L — AB (ref 39–117)
ALT: 10 IU/L (ref 0–44)
AST: 35 IU/L (ref 0–40)
BILIRUBIN TOTAL: 0.3 mg/dL (ref 0.0–1.2)
BUN / CREAT RATIO: 12 (ref 9–20)
BUN: 13 mg/dL (ref 6–24)
CHLORIDE: 99 mmol/L (ref 96–106)
CO2: 22 mmol/L (ref 20–29)
Calcium: 9.1 mg/dL (ref 8.7–10.2)
Creatinine, Ser: 1.12 mg/dL (ref 0.76–1.27)
GFR calc Af Amer: 87 mL/min/{1.73_m2} (ref >59)
GFR calc non Af Amer: 76 mL/min/{1.73_m2} (ref >59)
GLOBULIN, TOTAL: 2.4 g/dL (ref 1.5–4.5)
Glucose: 70 mg/dL (ref 65–99)
POTASSIUM: 4.2 mmol/L (ref 3.5–5.2)
SODIUM: 137 mmol/L (ref 134–144)
Total Protein: 6.7 g/dL (ref 6.0–8.5)

## 2017-07-07 ENCOUNTER — Other Ambulatory Visit (INDEPENDENT_AMBULATORY_CARE_PROVIDER_SITE_OTHER): Payer: Self-pay | Admitting: Orthopedic Surgery

## 2017-07-07 ENCOUNTER — Other Ambulatory Visit: Payer: Self-pay | Admitting: Family Medicine

## 2017-07-07 DIAGNOSIS — M19172 Post-traumatic osteoarthritis, left ankle and foot: Secondary | ICD-10-CM

## 2017-07-09 NOTE — Telephone Encounter (Signed)
Last refill was 06/29/17 #60 pt is to call when he is ready for an ankle fusion. Please advise if you wish to refill

## 2017-07-09 NOTE — Telephone Encounter (Signed)
Too soon to refill.

## 2017-07-12 ENCOUNTER — Other Ambulatory Visit (INDEPENDENT_AMBULATORY_CARE_PROVIDER_SITE_OTHER): Payer: Self-pay | Admitting: Orthopedic Surgery

## 2017-07-13 NOTE — Telephone Encounter (Signed)
2nd request for refill from pharmacy. Mandie Crabbe,CMA

## 2017-07-14 ENCOUNTER — Telehealth (INDEPENDENT_AMBULATORY_CARE_PROVIDER_SITE_OTHER): Payer: Self-pay | Admitting: Orthopedic Surgery

## 2017-07-14 NOTE — Telephone Encounter (Signed)
Patient's (Fiance) Carol Allegrezza called concerning refill for (Tramadol) for the patient. The number to contact Patient is [phone]

## 2017-07-19 ENCOUNTER — Other Ambulatory Visit: Payer: Self-pay | Admitting: *Deleted

## 2017-07-19 ENCOUNTER — Other Ambulatory Visit: Payer: Self-pay | Admitting: Family Medicine

## 2017-07-19 ENCOUNTER — Telehealth (INDEPENDENT_AMBULATORY_CARE_PROVIDER_SITE_OTHER): Payer: Self-pay | Admitting: Orthopedic Surgery

## 2017-07-19 ENCOUNTER — Other Ambulatory Visit: Payer: Self-pay

## 2017-07-19 MED ORDER — LOSARTAN POTASSIUM 50 MG PO TABS: ORAL_TABLET | ORAL | 2 refills | Status: DC

## 2017-07-19 MED ORDER — TRAMADOL HCL 50 MG PO TABS: 50.0000 mg | ORAL_TABLET | Freq: Four times a day (QID) | ORAL | 0 refills | Status: DC | PRN

## 2017-07-19 NOTE — Telephone Encounter (Signed)
Ok to refill tramadol 

## 2017-07-19 NOTE — Telephone Encounter (Signed)
Requesting a 90 day supply of his medication be sent in for better adherence. [name],CMA

## 2017-07-19 NOTE — Telephone Encounter (Signed)
Okay refill prescription for tramadol.

## 2017-07-19 NOTE — Telephone Encounter (Signed)
Patient called asking for a refill on his Tramadol. CB # [phone]

## 2017-07-19 NOTE — Telephone Encounter (Signed)
Losartan sent to wrong pharmacy. Sent to [facility] per patient request. Danley Danker, RN [facility] RN)

## 2017-07-19 NOTE — Telephone Encounter (Signed)
See other message

## 2017-07-19 NOTE — Telephone Encounter (Signed)
IC patient and advised Rx called in to pharm.  Called to [facility] at his request.

## 2017-07-23 ENCOUNTER — Other Ambulatory Visit: Payer: Self-pay | Admitting: Family Medicine

## 2017-07-23 DIAGNOSIS — M19172 Post-traumatic osteoarthritis, left ankle and foot: Secondary | ICD-10-CM

## 2017-08-04 ENCOUNTER — Encounter: Payer: Self-pay | Admitting: Family Medicine

## 2017-08-11 ENCOUNTER — Ambulatory Visit: Payer: Self-pay | Admitting: Internal Medicine

## 2017-08-11 ENCOUNTER — Telehealth: Payer: Self-pay | Admitting: Internal Medicine

## 2017-08-11 NOTE — Telephone Encounter (Signed)
No charge. 

## 2017-08-17 ENCOUNTER — Other Ambulatory Visit: Payer: Self-pay | Admitting: Family Medicine

## 2017-08-17 DIAGNOSIS — I10 Essential (primary) hypertension: Secondary | ICD-10-CM

## 2017-08-29 ENCOUNTER — Other Ambulatory Visit: Payer: Self-pay | Admitting: Family Medicine

## 2017-08-29 DIAGNOSIS — J302 Other seasonal allergic rhinitis: Secondary | ICD-10-CM

## 2017-08-31 ENCOUNTER — Encounter: Payer: Self-pay | Admitting: Internal Medicine

## 2017-09-08 ENCOUNTER — Telehealth (INDEPENDENT_AMBULATORY_CARE_PROVIDER_SITE_OTHER): Payer: Self-pay | Admitting: Orthopedic Surgery

## 2017-09-08 ENCOUNTER — Other Ambulatory Visit (INDEPENDENT_AMBULATORY_CARE_PROVIDER_SITE_OTHER): Payer: Self-pay | Admitting: Orthopedic Surgery

## 2017-09-08 NOTE — Telephone Encounter (Signed)
First saw pt in October of last year and discussed ankle fusion surgery. Pt has been receiving refills for tramadol and is asking for refill still has not set date for surgery. Please advise.

## 2017-09-08 NOTE — Telephone Encounter (Signed)
Refill

## 2017-09-08 NOTE — Telephone Encounter (Signed)
Ok refill? 

## 2017-09-08 NOTE — Telephone Encounter (Signed)
Patient called asking for a refill on his tramadol. CB # [id number]

## 2017-09-09 ENCOUNTER — Other Ambulatory Visit (INDEPENDENT_AMBULATORY_CARE_PROVIDER_SITE_OTHER): Payer: Self-pay

## 2017-09-09 MED ORDER — TRAMADOL HCL 50 MG PO TABS: 50.0000 mg | ORAL_TABLET | Freq: Four times a day (QID) | ORAL | 0 refills | Status: DC | PRN

## 2017-09-09 NOTE — Telephone Encounter (Signed)
Patient wondering if the tramadol can be sent into the pharmacy so he doesn't have to make two trips. He uses [facility] representative on [address]  Please advise # [phone]

## 2017-09-09 NOTE — Telephone Encounter (Signed)
Called pt to advise that rx has been called to pharm. Pt states that he would also like to proceed with setting up surgery. I advised I would send message to you and let you know.

## 2017-09-09 NOTE — Telephone Encounter (Signed)
Called pt to advise that rx is at the front desk for pick up.

## 2017-09-14 ENCOUNTER — Other Ambulatory Visit: Payer: Self-pay

## 2017-09-14 MED ORDER — LOSARTAN POTASSIUM 50 MG PO TABS: ORAL_TABLET | ORAL | 4 refills | Status: DC

## 2017-09-14 NOTE — Telephone Encounter (Signed)
Received fax from mail order pharmacy requesting 90 day supply of losartan. Please advise.

## 2017-09-21 ENCOUNTER — Other Ambulatory Visit (INDEPENDENT_AMBULATORY_CARE_PROVIDER_SITE_OTHER): Payer: Self-pay | Admitting: Orthopedic Surgery

## 2017-09-21 DIAGNOSIS — M19172 Post-traumatic osteoarthritis, left ankle and foot: Secondary | ICD-10-CM

## 2017-09-27 ENCOUNTER — Other Ambulatory Visit (INDEPENDENT_AMBULATORY_CARE_PROVIDER_SITE_OTHER): Payer: Self-pay | Admitting: Orthopedic Surgery

## 2017-10-06 ENCOUNTER — Encounter: Payer: Self-pay | Admitting: Internal Medicine

## 2017-10-06 ENCOUNTER — Ambulatory Visit (INDEPENDENT_AMBULATORY_CARE_PROVIDER_SITE_OTHER): Payer: Medicare Other | Admitting: Internal Medicine

## 2017-10-06 VITALS — BP 140/86 | HR 111 | Ht 69.0 in | Wt 210.6 lb

## 2017-10-06 DIAGNOSIS — R112 Nausea with vomiting, unspecified: Secondary | ICD-10-CM

## 2017-10-06 DIAGNOSIS — K269 Duodenal ulcer, unspecified as acute or chronic, without hemorrhage or perforation: Secondary | ICD-10-CM

## 2017-10-06 DIAGNOSIS — K219 Gastro-esophageal reflux disease without esophagitis: Secondary | ICD-10-CM | POA: Diagnosis not present

## 2017-10-06 DIAGNOSIS — R109 Unspecified abdominal pain: Secondary | ICD-10-CM | POA: Diagnosis not present

## 2017-10-06 DIAGNOSIS — D132 Benign neoplasm of duodenum: Secondary | ICD-10-CM

## 2017-10-06 DIAGNOSIS — Z1211 Encounter for screening for malignant neoplasm of colon: Secondary | ICD-10-CM | POA: Diagnosis not present

## 2017-10-06 MED ORDER — OMEPRAZOLE 40 MG PO CPDR: 40.0000 mg | DELAYED_RELEASE_CAPSULE | Freq: Every day | ORAL | 11 refills | Status: DC

## 2017-10-06 NOTE — Patient Instructions (Addendum)
You have been scheduled for an endoscopy. Please follow written instructions given to you at your visit today. If you use inhalers (even only as needed), please bring them with you on the day of your procedure. Your physician has requested that you go to www.startemmi.com and enter the access code given to you at your visit today. This web site gives a general overview about your procedure. However, you should still follow specific instructions given to you by our office regarding your preparation for the procedure.   We have sent the following medications to your pharmacy for you to pick up at your convenience:  Omeprazole

## 2017-10-06 NOTE — Progress Notes (Signed)
HISTORY OF PRESENT ILLNESS:  [name] is a 52 y.o. male , adopted disabled native American, who sent by his primary care provider from [facility] regarding upper abdominal complaints, "stomach mass", the need for endoscopy, and screening colonoscopy. He is accompanied by his fiance. He has a history of schizophrenia, anxiety, stroke, and arthritis. Patient has scattered records in multiple areas. He has chronic tobacco abuse and a history of alcohol abuse. He underwent upper endoscopy with Dr. Wilford Corner 02/20/2015. The examination revealed mild esophagitis and gastritis as well as a hiatal hernia. The most significant finding was a 2 cm polypoid lesion in the duodenal bulb which was friable biopsies revealed adenoma. As best I can tell he was referred to [facility] and saw Dr. Newman Pies I did see his original consultation note. The patient apparently subsequently underwent upper endoscopy. I only have the pathology from 07/17/2015 which revealed a subcentimeter duodenal polyp which was a tubular adenoma without high-grade dysplasia. The base of the polyp was said to be negative for dysplasia. Not clear if he has had follow-up since or if he was told to have follow-up of this is his recollection. Other relevant information includes a contrast-enhanced CT scan of the abdomen and pelvis January 2018 to evaluate epigastric pain with nausea and vomiting. No cause for pain identified. He was noted to have a hiatal hernia, diverticulosis, benign-appearing liver lesions, stable adrenal mass, and degenerative spine disease. Recent laboratories from April 2019 show normal, perhaps a metabolic panel except for mildly elevated alkaline phosphatase at 147. Normal CBC except for mild decrease in hemoglobin at 12.5. Patient tells me that he has significant problems with indigestion and heartburn. As well intermittent epigastric pain which is burning in nature. He does take NSAIDs on a regular basis  such as ibuprofen and Naprosyn. He smokes at least 1 pack of cigarettes per day. He tells me that he drinks "occasional beer" approximately once per week. He also takes Tylenol for pain. He denies dysphagia. He takes Carafate without satisfactory results. He is not on PPI. He denies lower GI complaints. No issues with abnormal bowel habits. No bleeding. Family history unknown.  REVIEW OF SYSTEMS:  All non-GI ROS negative unless otherwise stated in the history of present illness except for arthritis, ankle pain, sinus and allergies  Past Medical History:  Diagnosis Date  . Anxiety   . Arthritis   . Asthma   . GERD (gastroesophageal reflux disease)   . Pneumonia    hx  . Pre-diabetes   . Schizophrenia ([place])   . Stroke [facility])    2009-or10    Past Surgical History:  Procedure Laterality Date  . ESOPHAGOGASTRODUODENOSCOPY Left 12/05/2013   Procedure: ESOPHAGOGASTRODUODENOSCOPY (EGD);  Surgeon: Arta Silence, MD;  Location: [facility];  Service: Endoscopy;  Laterality: Left;  . ESOPHAGOGASTRODUODENOSCOPY (EGD) WITH PROPOFOL N/A 02/20/2015   Procedure: ESOPHAGOGASTRODUODENOSCOPY (EGD) WITH PROPOFOL;  Surgeon: Wilford Corner, MD;  [facility]: [facility];  Service: Endoscopy;  Laterality: N/A;  . TOTAL KNEE ARTHROPLASTY Right 07/21/2016   Procedure: TOTAL KNEE ARTHROPLASTY;  Surgeon: Meredith Pel, MD;  Location: [facility];  Service: Orthopedics;  Laterality: Right;    Social History Bianca Vester Cafiero  reports that he has been smoking cigarettes.  He has a 6.50 pack-year smoking history. He has never used smokeless tobacco. He reports that he does not drink alcohol or use drugs.  family history is not on file. He was adopted.  No Known Allergies  PHYSICAL EXAMINATION: Vital signs: BP 140/86   Pulse (!) 111   Ht 5' 9" (1.753 m)   Wt 210 lb 9.6 oz (95.5 kg)   BMI 31.10 kg/m   Constitutional: generally well-appearing, no acute distress. reeks of cigarette smoke Psychiatric: alert  and oriented x3, cooperative Eyes: extraocular movements intact, anicteric, conjunctiva pink Mouth: oral pharynx moist, no lesions Neck: supple no lymphadenopathy Cardiovascular: heart regular rate and rhythm, no murmur Lungs: clear to auscultation bilaterally Abdomen: soft, obese, nontender, nondistended, no obvious ascites, no peritoneal signs, normal bowel sounds, no organomegaly Rectal:deferred Extremities: no clubbing, cyanosis, or lower extremity edema bilaterally Skin: no lesions on visible extremities Neuro: No focal deficits. Cranial nerves intact  ASSESSMENT:  #1. Chronic GERD. Inadequately treated #2. Epigastric pain. May be secondary to GERD. Rule out ulcer disease #3. History of duodenal adenoma. Not clear if this was completely resected at [facility]. #4. Colon cancer screening. Baseline risk   PLAN:  #1. Reflux precautions #2. Stop smoking #3. Stop drinking alcohol #4. Prescribe omeprazole 40 mg daily #5. Schedule upper endoscopy to evaluate GERD, epigastric pain, and follow-up of duodenal adenoma. The patient wishes to schedule this after his ankle surgery in 2 weeks.The nature of the procedure, as well as the risks, benefits, and alternatives were carefully and thoroughly reviewed with the patient. Ample time for discussion and questions allowed. The patient understood, was satisfied, and agreed to proceed. #6. Can arrange routine screening colonoscopy after his upper GI issues have been addressed. He agrees  A copy of this consultation note has been sent to [facility]  60 minutes spent face-to-face with the patient. Greater than 50% a time use for counseling regarding his myriad of upper GI symptoms, history of duodenal adenoma, and discussions surrounding colon cancer screening strategies 

## 2017-10-07 ENCOUNTER — Ambulatory Visit (INDEPENDENT_AMBULATORY_CARE_PROVIDER_SITE_OTHER): Payer: Medicare Other | Admitting: Orthopedic Surgery

## 2017-10-08 ENCOUNTER — Other Ambulatory Visit: Payer: Self-pay

## 2017-10-08 MED ORDER — LOSARTAN POTASSIUM 50 MG PO TABS: 50.0000 mg | ORAL_TABLET | Freq: Every day | ORAL | 0 refills | Status: DC

## 2017-10-08 NOTE — Pre-Procedure Instructions (Signed)
[name]  10/08/2017      [facility] [place], [place] - [address] [place] [address] Phone: [phone] Fax: [phone]  [place], [place] - [address] [place] [address] Phone: [phone] Fax: [phone]    Your procedure is scheduled on July 24  Report to [facility] at [place].M.  Call this number if you have problems the morning of surgery:  [phone]   Remember:  Do not eat or drink after midnight.      Take these medicines the morning of surgery with A SIP OF WATER  albuterol (PROVENTIL HFA;VENTOLIN HFA amLODipine (NORVASC)  buPROPion (WELLBUTRIN XL) fluticasone (FLONASE) cetirizine (ZYRTEC) omeprazole (PRILOSEC)   7 days prior to surgery STOP taking any Aspirin(unless otherwise instructed by your surgeon), Aleve, Naproxen, Ibuprofen, Motrin, Advil, Goody's, BC's, all herbal medications, fish oil, and all vitamins     Do not wear jewelry  Do not wear lotions, powders, or cologne, or deodorant.   Men may shave face and neck.  Do not bring valuables to the hospital.  [facility] is not responsible for any belongings or valuables.  Contacts, dentures or bridgework may not be worn into surgery.  Leave your suitcase in the car.  After surgery it may be brought to your room.  For patients admitted to the hospital, discharge time will be determined by your treatment team.  Patients discharged the day of surgery will not be allowed to drive home.    Special instructions:   [place]- Preparing For Surgery  Before surgery, you can play an important role. Because skin is not sterile, your skin needs to be as free of germs as possible. You can reduce the number of germs on your skin by washing with CHG (chlorahexidine gluconate) Soap before surgery.  CHG is an antiseptic cleaner which kills germs  and bonds with the skin to continue killing germs even after washing.    Oral Hygiene is also important to reduce your risk of infection.  Remember - BRUSH YOUR TEETH THE MORNING OF SURGERY WITH YOUR REGULAR TOOTHPASTE  Please do not use if you have an allergy to CHG or antibacterial soaps. If your skin becomes reddened/irritated stop using the CHG.  Do not shave (including legs and underarms) for at least 48 hours prior to first CHG shower. It is OK to shave your face.  Please follow these instructions carefully.   1. Shower the NIGHT BEFORE SURGERY and the MORNING OF SURGERY with CHG.   2. If you chose to wash your hair, wash your hair first as usual with your normal shampoo.  3. After you shampoo, rinse your hair and body thoroughly to remove the shampoo.  4. Use CHG as you would any other liquid soap. You can apply CHG directly to the skin and wash gently with a scrungie or a clean washcloth.   5. Apply the CHG Soap to your body ONLY FROM THE NECK DOWN.  Do not use on open wounds or open sores. Avoid contact with your eyes, ears, mouth and genitals (private parts). Wash Face and genitals (private parts)  with your normal soap.  6. Wash thoroughly, paying special attention to the area where your surgery will be performed.  7. Thoroughly rinse your body with warm water from the neck down.  8. DO NOT shower/wash with  your normal soap after using and rinsing off the CHG Soap.  9. Pat yourself dry with a CLEAN TOWEL.  10. Wear CLEAN PAJAMAS to bed the night before surgery, wear comfortable clothes the morning of surgery  11. Place CLEAN SHEETS on your bed the night of your first shower and DO NOT SLEEP WITH PETS.    Day of Surgery:  Do not apply any deodorants/lotions.  Please wear clean clothes to the hospital/surgery center.   Remember to brush your teeth WITH YOUR REGULAR TOOTHPASTE.    Please read over the following fact sheets that you were given.

## 2017-10-11 ENCOUNTER — Telehealth (INDEPENDENT_AMBULATORY_CARE_PROVIDER_SITE_OTHER): Payer: Self-pay | Admitting: Orthopedic Surgery

## 2017-10-11 NOTE — Telephone Encounter (Signed)
Patient called asked for a call back to discuss surgery before and what he is to expect after surgery. Patient was ill and could not keep his appointment 10/07/17. The number to contact patient is [phone]

## 2017-10-12 ENCOUNTER — Encounter (HOSPITAL_COMMUNITY)
Admission: RE | Admit: 2017-10-12 | Discharge: 2017-10-12 | Disposition: A | Discharge status: Home / Self Care | Payer: Medicare Other | Source: Ambulatory Visit | Attending: Orthopedic Surgery | Admitting: Orthopedic Surgery

## 2017-10-12 ENCOUNTER — Encounter (HOSPITAL_COMMUNITY): Payer: Self-pay

## 2017-10-12 ENCOUNTER — Other Ambulatory Visit: Payer: Self-pay

## 2017-10-12 DIAGNOSIS — Z01812 Encounter for preprocedural laboratory examination: Secondary | ICD-10-CM | POA: Insufficient documentation

## 2017-10-12 HISTORY — DX: Post-traumatic osteoarthritis, left ankle and foot: M19.172

## 2017-10-12 LAB — BASIC METABOLIC PANEL
Anion gap: 10 (ref 5–15)
BUN: 6 mg/dL (ref 6–20)
CO2: 22 mmol/L (ref 22–32)
CREATININE: 0.95 mg/dL (ref 0.61–1.24)
Calcium: 8.7 mg/dL — ABNORMAL LOW (ref 8.9–10.3)
Chloride: 105 mmol/L (ref 98–111)
GFR calc Af Amer: >60 mL/min (ref >60)
GFR calc non Af Amer: >60 mL/min (ref >60)
GLUCOSE: 116 mg/dL — AB (ref 70–99)
POTASSIUM: 3.7 mmol/L (ref 3.5–5.1)
SODIUM: 137 mmol/L (ref 135–145)

## 2017-10-12 LAB — CBC
HEMATOCRIT: 41.8 % (ref 39.0–52.0)
Hemoglobin: 13 g/dL (ref 13.0–17.0)
MCH: 26.9 pg (ref 26.0–34.0)
MCHC: 31.1 g/dL (ref 30.0–36.0)
MCV: 86.4 fL (ref 78.0–100.0)
PLATELETS: 344 10*3/uL (ref 150–400)
RBC: 4.84 MIL/uL (ref 4.22–5.81)
RDW: 14.7 % (ref 11.5–15.5)
WBC: 6.8 10*3/uL (ref 4.0–10.5)

## 2017-10-12 NOTE — Progress Notes (Signed)
PCP - Dr. Marjie Skiff  Cardiologist - Denies  Chest x-ray - Denies  EKG - 04/28/17 (E)  Stress Test - Denies  ECHO - 08/07/08 (E)  Cardiac Cath - Denies  Sleep Study - Denies CPAP - None  LABS- 10/12/17: CBC, BMP  ASA- Denies  Anesthesia- No  Pt denies having chest pain, sob, or fever at this time. All instructions explained to the pt, with a verbal understanding of the material. Pt agrees to go over the instructions while at home for a better understanding. The opportunity to ask questions was provided.

## 2017-10-13 NOTE — Telephone Encounter (Signed)
I called and sw pt. Advised that he will be non weight bearing for 4-6 weeks at least. He will be in the hospital and will stay for several days following surgery. He will work with therapy to make sure that he is safe to transition from bed to chair and ambulate with device to ensure non weight bearing status and be allowed to come home. He will leave the surgical dressing intact until after his first post op appt in which we will give him instruction on dressing changes etc. He will call with additional questions pt is sch for surgery on Wednesday.

## 2017-10-19 NOTE — Anesthesia Preprocedure Evaluation (Addendum)
Anesthesia Evaluation  Patient identified by MRN, date of birth, ID band Patient awake    Reviewed: Allergy & Precautions, H&P , NPO status , Patient's Chart, lab work & pertinent test results  Airway Mallampati: II  TM Distance: >3 FB Neck ROM: Full    Dental no notable dental hx. (+) Teeth Intact, Dental Advisory Given, Partial Upper, Poor Dentition, Chipped, Missing,    Pulmonary asthma , Current Smoker,    Pulmonary exam normal breath sounds clear to auscultation       Cardiovascular hypertension, Pt. on medications  Rhythm:Regular Rate:Normal     Neuro/Psych PSYCHIATRIC DISORDERS Anxiety Depression Schizophrenia CVA    GI/Hepatic GERD  Medicated,(+)     substance abuse  alcohol use and cocaine use,   Endo/Other  diabetes, Type 2, Oral Hypoglycemic Agents  Renal/GU   negative genitourinary   Musculoskeletal  (+) Arthritis , Osteoarthritis,    Abdominal   Peds  Hematology negative hematology ROS (+) anemia ,   Anesthesia Other Findings   Reproductive/Obstetrics negative OB ROS                            Anesthesia Physical  Anesthesia Plan  ASA: II  Anesthesia Plan: General   Post-op Pain Management:  Regional for Post-op pain and GA combined w/ Regional for post-op pain   Induction: Intravenous  PONV Risk Score and Plan: 2 and Ondansetron, Dexamethasone and Treatment may vary due to age or medical condition  Airway Management Planned: Simple Face Mask and LMA  Additional Equipment:   Intra-op Plan:   Post-operative Plan:   Informed Consent: I have reviewed the patients History and Physical, chart, labs and discussed the procedure including the risks, benefits and alternatives for the proposed anesthesia with the patient or authorized representative who has indicated his/her understanding and acceptance.   Dental advisory given  Plan Discussed with: CRNA,  Anesthesiologist and Surgeon  Anesthesia Plan Comments: ( )        Anesthesia Quick Evaluation

## 2017-10-20 ENCOUNTER — Inpatient Hospital Stay (HOSPITAL_COMMUNITY): Payer: Medicare Other | Admitting: Anesthesiology

## 2017-10-20 ENCOUNTER — Ambulatory Visit (HOSPITAL_COMMUNITY)
Admission: RE | Admit: 2017-10-20 | Discharge: 2017-10-21 | Disposition: A | Discharge status: Home / Self Care | Payer: Medicare Other | Source: Ambulatory Visit | Attending: Orthopedic Surgery | Admitting: Orthopedic Surgery

## 2017-10-20 ENCOUNTER — Encounter (HOSPITAL_COMMUNITY): Payer: Self-pay | Admitting: *Deleted

## 2017-10-20 ENCOUNTER — Other Ambulatory Visit: Payer: Self-pay

## 2017-10-20 ENCOUNTER — Encounter (HOSPITAL_COMMUNITY)
Admission: RE | Disposition: A | Discharge status: Home / Self Care | Payer: Self-pay | Source: Ambulatory Visit | Attending: Orthopedic Surgery

## 2017-10-20 DIAGNOSIS — I10 Essential (primary) hypertension: Secondary | ICD-10-CM | POA: Diagnosis not present

## 2017-10-20 DIAGNOSIS — F419 Anxiety disorder, unspecified: Secondary | ICD-10-CM | POA: Insufficient documentation

## 2017-10-20 DIAGNOSIS — F17210 Nicotine dependence, cigarettes, uncomplicated: Secondary | ICD-10-CM | POA: Insufficient documentation

## 2017-10-20 DIAGNOSIS — K219 Gastro-esophageal reflux disease without esophagitis: Secondary | ICD-10-CM | POA: Diagnosis not present

## 2017-10-20 DIAGNOSIS — G8918 Other acute postprocedural pain: Secondary | ICD-10-CM | POA: Diagnosis not present

## 2017-10-20 DIAGNOSIS — M19172 Post-traumatic osteoarthritis, left ankle and foot: Secondary | ICD-10-CM | POA: Diagnosis not present

## 2017-10-20 DIAGNOSIS — Z79899 Other long term (current) drug therapy: Secondary | ICD-10-CM | POA: Diagnosis not present

## 2017-10-20 DIAGNOSIS — Z8673 Personal history of transient ischemic attack (TIA), and cerebral infarction without residual deficits: Secondary | ICD-10-CM | POA: Insufficient documentation

## 2017-10-20 DIAGNOSIS — Z7984 Long term (current) use of oral hypoglycemic drugs: Secondary | ICD-10-CM | POA: Diagnosis not present

## 2017-10-20 DIAGNOSIS — R7303 Prediabetes: Secondary | ICD-10-CM | POA: Diagnosis not present

## 2017-10-20 DIAGNOSIS — Z981 Arthrodesis status: Secondary | ICD-10-CM

## 2017-10-20 HISTORY — PX: ANKLE FUSION: SHX881

## 2017-10-20 HISTORY — PX: ANKLE FUSION: SHX5718

## 2017-10-20 HISTORY — PX: ANKLE ARTHROSCOPY: SHX545

## 2017-10-20 LAB — GLUCOSE, CAPILLARY: GLUCOSE-CAPILLARY: 137 mg/dL — AB (ref 70–99)

## 2017-10-20 SURGERY — ANKLE FUSION
Anesthesia: General | Laterality: Left

## 2017-10-20 MED ORDER — OXYCODONE HCL 5 MG PO TABS
ORAL_TABLET | ORAL | Status: AC
  Filled 2017-10-20: qty 1

## 2017-10-20 MED ORDER — FENTANYL CITRATE (PF) 100 MCG/2ML IJ SOLN
INTRAMUSCULAR | Status: AC
  Filled 2017-10-20: qty 2

## 2017-10-20 MED ORDER — LIDOCAINE 2% (20 MG/ML) 5 ML SYRINGE
INTRAMUSCULAR | Status: AC
  Filled 2017-10-20: qty 5

## 2017-10-20 MED ORDER — BUPROPION HCL ER (XL) 150 MG PO TB24
150.0000 mg | ORAL_TABLET | Freq: Every day | ORAL | Status: DC
  Administered 2017-10-21: 150 mg via ORAL
  Filled 2017-10-20 (×2): qty 1

## 2017-10-20 MED ORDER — PROPOFOL 10 MG/ML IV BOLUS
INTRAVENOUS | Status: AC
  Filled 2017-10-20: qty 40

## 2017-10-20 MED ORDER — ONDANSETRON HCL 4 MG/2ML IJ SOLN: 4.0000 mg | Freq: Once | INTRAMUSCULAR | Status: DC | PRN

## 2017-10-20 MED ORDER — MIRTAZAPINE 15 MG PO TABS
15.0000 mg | ORAL_TABLET | Freq: Every day | ORAL | Status: DC
  Administered 2017-10-20: 15 mg via ORAL
  Filled 2017-10-20: qty 1

## 2017-10-20 MED ORDER — MEPERIDINE HCL 50 MG/ML IJ SOLN: 6.2500 mg | INTRAMUSCULAR | Status: DC | PRN

## 2017-10-20 MED ORDER — MAGNESIUM CITRATE PO SOLN
1.0000 | Freq: Once | ORAL | Status: DC | PRN
Start: 2017-10-20 — End: 2017-10-21

## 2017-10-20 MED ORDER — ROPIVACAINE HCL 7.5 MG/ML IJ SOLN
INTRAMUSCULAR | Status: DC | PRN
  Administered 2017-10-20: 30 mL via PERINEURAL

## 2017-10-20 MED ORDER — METOCLOPRAMIDE HCL 5 MG/ML IJ SOLN: 5.0000 mg | Freq: Three times a day (TID) | INTRAMUSCULAR | Status: DC | PRN

## 2017-10-20 MED ORDER — MIDAZOLAM HCL 2 MG/2ML IJ SOLN
INTRAMUSCULAR | Status: AC
  Filled 2017-10-20: qty 2

## 2017-10-20 MED ORDER — ONDANSETRON HCL 4 MG/2ML IJ SOLN
INTRAMUSCULAR | Status: DC | PRN
  Administered 2017-10-20: 4 mg via INTRAVENOUS

## 2017-10-20 MED ORDER — DEXAMETHASONE SODIUM PHOSPHATE 10 MG/ML IJ SOLN
INTRAMUSCULAR | Status: AC
  Filled 2017-10-20: qty 1

## 2017-10-20 MED ORDER — PROPOFOL 10 MG/ML IV BOLUS
INTRAVENOUS | Status: DC | PRN
  Administered 2017-10-20: 100 mg via INTRAVENOUS

## 2017-10-20 MED ORDER — ONDANSETRON HCL 4 MG PO TABS: 4.0000 mg | ORAL_TABLET | Freq: Four times a day (QID) | ORAL | Status: DC | PRN

## 2017-10-20 MED ORDER — OXYCODONE HCL 5 MG PO TABS
5.0000 mg | ORAL_TABLET | ORAL | Status: DC | PRN
  Administered 2017-10-20 – 2017-10-21 (×4): 10 mg via ORAL
  Filled 2017-10-20 (×4): qty 2

## 2017-10-20 MED ORDER — ACETAMINOPHEN 160 MG/5ML PO SOLN: 325.0000 mg | ORAL | Status: DC | PRN

## 2017-10-20 MED ORDER — METOCLOPRAMIDE HCL 5 MG PO TABS: 5.0000 mg | ORAL_TABLET | Freq: Three times a day (TID) | ORAL | Status: DC | PRN

## 2017-10-20 MED ORDER — ASPIRIN EC 325 MG PO TBEC
325.0000 mg | DELAYED_RELEASE_TABLET | Freq: Every day | ORAL | Status: DC
  Administered 2017-10-20: 325 mg via ORAL
  Filled 2017-10-20: qty 1

## 2017-10-20 MED ORDER — DOCUSATE SODIUM 100 MG PO CAPS
100.0000 mg | ORAL_CAPSULE | Freq: Two times a day (BID) | ORAL | Status: DC
  Administered 2017-10-20 – 2017-10-21 (×2): 100 mg via ORAL
  Filled 2017-10-20 (×2): qty 1

## 2017-10-20 MED ORDER — OXYCODONE HCL 5 MG PO TABS
10.0000 mg | ORAL_TABLET | ORAL | Status: DC | PRN
  Administered 2017-10-21 (×2): 15 mg via ORAL
  Filled 2017-10-20 (×2): qty 3

## 2017-10-20 MED ORDER — DEXMEDETOMIDINE HCL IN NACL 200 MCG/50ML IV SOLN
INTRAVENOUS | Status: DC | PRN
  Administered 2017-10-20: 4 ug via INTRAVENOUS
  Administered 2017-10-20: 8 ug via INTRAVENOUS

## 2017-10-20 MED ORDER — ONDANSETRON HCL 4 MG/2ML IJ SOLN
INTRAMUSCULAR | Status: AC
  Filled 2017-10-20: qty 2

## 2017-10-20 MED ORDER — ACETAMINOPHEN 325 MG PO TABS: 325.0000 mg | ORAL_TABLET | ORAL | Status: DC | PRN

## 2017-10-20 MED ORDER — OXYCODONE HCL 5 MG PO TABS
5.0000 mg | ORAL_TABLET | Freq: Once | ORAL | Status: AC | PRN
  Administered 2017-10-20: 5 mg via ORAL

## 2017-10-20 MED ORDER — OXYCODONE HCL 5 MG/5ML PO SOLN: 5.0000 mg | Freq: Once | ORAL | Status: AC | PRN

## 2017-10-20 MED ORDER — SODIUM CHLORIDE 0.9 % IV SOLN
INTRAVENOUS | Status: DC
  Administered 2017-10-20: 11:00:00 via INTRAVENOUS

## 2017-10-20 MED ORDER — FENTANYL CITRATE (PF) 100 MCG/2ML IJ SOLN
25.0000 ug | INTRAMUSCULAR | Status: DC | PRN
  Administered 2017-10-20 (×3): 50 ug via INTRAVENOUS

## 2017-10-20 MED ORDER — CEFAZOLIN SODIUM-DEXTROSE 2-4 GM/100ML-% IV SOLN
INTRAVENOUS | Status: AC
  Filled 2017-10-20: qty 100

## 2017-10-20 MED ORDER — FENTANYL CITRATE (PF) 250 MCG/5ML IJ SOLN
INTRAMUSCULAR | Status: DC | PRN
  Administered 2017-10-20: 75 ug via INTRAVENOUS
  Administered 2017-10-20 (×3): 50 ug via INTRAVENOUS
  Administered 2017-10-20: 25 ug via INTRAVENOUS

## 2017-10-20 MED ORDER — HYDROMORPHONE HCL 1 MG/ML IJ SOLN
0.5000 mg | INTRAMUSCULAR | Status: DC | PRN
  Administered 2017-10-20 – 2017-10-21 (×5): 1 mg via INTRAVENOUS
  Filled 2017-10-20 (×5): qty 1

## 2017-10-20 MED ORDER — DEXAMETHASONE SODIUM PHOSPHATE 10 MG/ML IJ SOLN
INTRAMUSCULAR | Status: DC | PRN
  Administered 2017-10-20: 5 mg via INTRAVENOUS

## 2017-10-20 MED ORDER — ONDANSETRON HCL 4 MG/2ML IJ SOLN: 4.0000 mg | Freq: Four times a day (QID) | INTRAMUSCULAR | Status: DC | PRN

## 2017-10-20 MED ORDER — BENZTROPINE MESYLATE 1 MG PO TABS
1.0000 mg | ORAL_TABLET | Freq: Every day | ORAL | Status: DC
Start: 2017-10-20 — End: 2017-10-20

## 2017-10-20 MED ORDER — DEXTROSE 5 % IV SOLN
500.0000 mg | Freq: Four times a day (QID) | INTRAVENOUS | Status: DC | PRN
  Filled 2017-10-20: qty 5

## 2017-10-20 MED ORDER — FENTANYL CITRATE (PF) 250 MCG/5ML IJ SOLN
INTRAMUSCULAR | Status: AC
Start: 2017-10-20 — End: ?
  Filled 2017-10-20: qty 5

## 2017-10-20 MED ORDER — CEFAZOLIN SODIUM-DEXTROSE 2-4 GM/100ML-% IV SOLN
2.0000 g | INTRAVENOUS | Status: AC
  Administered 2017-10-20: 2 g via INTRAVENOUS

## 2017-10-20 MED ORDER — POLYETHYLENE GLYCOL 3350 17 G PO PACK: 17.0000 g | PACK | Freq: Every day | ORAL | Status: DC | PRN

## 2017-10-20 MED ORDER — MIDAZOLAM HCL 2 MG/2ML IJ SOLN
INTRAMUSCULAR | Status: DC | PRN
  Administered 2017-10-20: 2 mg via INTRAVENOUS

## 2017-10-20 MED ORDER — NICOTINE 21 MG/24HR TD PT24
21.0000 mg | MEDICATED_PATCH | Freq: Every day | TRANSDERMAL | Status: DC
  Administered 2017-10-20 – 2017-10-21 (×2): 21 mg via TRANSDERMAL
  Filled 2017-10-20 (×2): qty 1

## 2017-10-20 MED ORDER — CHLORHEXIDINE GLUCONATE 4 % EX LIQD: 60.0000 mL | Freq: Once | CUTANEOUS | Status: DC

## 2017-10-20 MED ORDER — METHOCARBAMOL 500 MG PO TABS
500.0000 mg | ORAL_TABLET | Freq: Four times a day (QID) | ORAL | Status: DC | PRN
  Administered 2017-10-20 – 2017-10-21 (×3): 500 mg via ORAL
  Filled 2017-10-20 (×3): qty 1

## 2017-10-20 MED ORDER — ACETAMINOPHEN 325 MG PO TABS
325.0000 mg | ORAL_TABLET | Freq: Four times a day (QID) | ORAL | Status: DC | PRN
  Administered 2017-10-20: 650 mg via ORAL
  Filled 2017-10-20: qty 2

## 2017-10-20 MED ORDER — GLYCOPYRROLATE PF 0.2 MG/ML IJ SOSY
PREFILLED_SYRINGE | INTRAMUSCULAR | Status: AC
  Filled 2017-10-20: qty 1

## 2017-10-20 MED ORDER — LACTATED RINGERS IV SOLN
INTRAVENOUS | Status: DC | PRN
  Administered 2017-10-20 (×2): via INTRAVENOUS

## 2017-10-20 MED ORDER — BISACODYL 10 MG RE SUPP: 10.0000 mg | Freq: Every day | RECTAL | Status: DC | PRN

## 2017-10-20 MED ORDER — PHENYLEPHRINE 40 MCG/ML (10ML) SYRINGE FOR IV PUSH (FOR BLOOD PRESSURE SUPPORT)
PREFILLED_SYRINGE | INTRAVENOUS | Status: DC | PRN
  Administered 2017-10-20 (×2): 40 ug via INTRAVENOUS
  Administered 2017-10-20 (×3): 80 ug via INTRAVENOUS

## 2017-10-20 MED ORDER — SUCRALFATE 1 G PO TABS
1.0000 g | ORAL_TABLET | Freq: Three times a day (TID) | ORAL | Status: DC
  Administered 2017-10-20 – 2017-10-21 (×6): 1 g via ORAL
  Filled 2017-10-20 (×6): qty 1

## 2017-10-20 MED ORDER — LOSARTAN POTASSIUM 50 MG PO TABS: 50.0000 mg | ORAL_TABLET | Freq: Every day | ORAL | Status: DC

## 2017-10-20 MED ORDER — CEFAZOLIN SODIUM-DEXTROSE 2-4 GM/100ML-% IV SOLN
2.0000 g | Freq: Four times a day (QID) | INTRAVENOUS | Status: AC
  Administered 2017-10-20 – 2017-10-21 (×3): 2 g via INTRAVENOUS
  Filled 2017-10-20 (×3): qty 100

## 2017-10-20 MED ORDER — SODIUM CHLORIDE 0.9 % IR SOLN
Status: DC | PRN
  Administered 2017-10-20: 3000 mL

## 2017-10-20 SURGICAL SUPPLY — 45 items
BANDAGE ESMARK 6X9 LF (GAUZE/BANDAGES/DRESSINGS) ×1 IMPLANT
BLADE AVERAGE 25X9 (BLADE) IMPLANT
BLADE SURG 10 STRL SS (BLADE) IMPLANT
BNDG COHESIVE 4X5 TAN STRL (GAUZE/BANDAGES/DRESSINGS) ×2 IMPLANT
BNDG ESMARK 6X9 LF (GAUZE/BANDAGES/DRESSINGS) ×2
BNDG GAUZE ELAST 4 BULKY (GAUZE/BANDAGES/DRESSINGS) ×2 IMPLANT
BUR EGG ELITE 4.0 (BURR) ×2 IMPLANT
BURR OVAL 8 FLU 4.0X13 (MISCELLANEOUS) ×2 IMPLANT
COVER MAYO STAND STRL (DRAPES) ×2 IMPLANT
COVER SURGICAL LIGHT HANDLE (MISCELLANEOUS) ×2 IMPLANT
DISSECTOR  3.8MM X 13CM (MISCELLANEOUS) ×1
DISSECTOR 3.8MM X 13CM (MISCELLANEOUS) ×1 IMPLANT
DRAPE OEC MINIVIEW 54X84 (DRAPES) IMPLANT
DRAPE STERI 35X30 U-POUCH (DRAPES) ×2 IMPLANT
DRAPE U-SHAPE 47X51 STRL (DRAPES) ×2 IMPLANT
DRSG ADAPTIC 3X8 NADH LF (GAUZE/BANDAGES/DRESSINGS) ×2 IMPLANT
DURAPREP 26ML APPLICATOR (WOUND CARE) ×2 IMPLANT
ELECT REM PT RETURN 9FT ADLT (ELECTROSURGICAL) ×2
ELECTRODE REM PT RTRN 9FT ADLT (ELECTROSURGICAL) ×1 IMPLANT
GAUZE SPONGE 4X4 12PLY STRL (GAUZE/BANDAGES/DRESSINGS) IMPLANT
GAUZE SPONGE 4X4 12PLY STRL LF (GAUZE/BANDAGES/DRESSINGS) ×2 IMPLANT
GLOVE BIOGEL PI IND STRL 9 (GLOVE) ×1 IMPLANT
GLOVE BIOGEL PI INDICATOR 9 (GLOVE) ×1
GLOVE SURG ORTHO 9.0 STRL STRW (GLOVE) ×2 IMPLANT
GOWN STRL REUS W/ TWL LRG LVL3 (GOWN DISPOSABLE) ×1 IMPLANT
GOWN STRL REUS W/ TWL XL LVL3 (GOWN DISPOSABLE) ×1 IMPLANT
GOWN STRL REUS W/TWL LRG LVL3 (GOWN DISPOSABLE) ×1
GOWN STRL REUS W/TWL XL LVL3 (GOWN DISPOSABLE) ×1
GUIDEWIRE THREADED 2.8 (WIRE) ×4 IMPLANT
KIT BASIN OR (CUSTOM PROCEDURE TRAY) ×2 IMPLANT
KIT TURNOVER KIT B (KITS) ×2 IMPLANT
NS IRRIG 1000ML POUR BTL (IV SOLUTION) ×2 IMPLANT
PACK ORTHO EXTREMITY (CUSTOM PROCEDURE TRAY) ×2 IMPLANT
PAD ABD 8X10 STRL (GAUZE/BANDAGES/DRESSINGS) ×2 IMPLANT
PAD ARMBOARD 7.5X6 YLW CONV (MISCELLANEOUS) ×4 IMPLANT
PROBE APOLLO 90XL (SURGICAL WAND) ×2 IMPLANT
SCREW 6.5X70MM (Screw) ×2 IMPLANT
SCREW COMPRESSION HLESS 6.5X55 (Screw) ×2 IMPLANT
SPONGE LAP 18X18 X RAY DECT (DISPOSABLE) ×2 IMPLANT
SUCTION FRAZIER HANDLE 10FR (MISCELLANEOUS) ×1
SUCTION TUBE FRAZIER 10FR DISP (MISCELLANEOUS) ×1 IMPLANT
SUT ETHILON 2 0 PSLX (SUTURE) ×4 IMPLANT
TOWEL OR 17X26 10 PK STRL BLUE (TOWEL DISPOSABLE) ×2 IMPLANT
TUBE CONNECTING 12X1/4 (SUCTIONS) ×2 IMPLANT
TUBING ARTHROSCOPY IRRIG 16FT (MISCELLANEOUS) ×2 IMPLANT

## 2017-10-20 NOTE — Anesthesia Procedure Notes (Signed)
Procedure Name: LMA Insertion Date/Time: 10/20/2017 7:23 AM Performed by: Barrington Ellison, CRNA Pre-anesthesia Checklist: Patient identified, Emergency Drugs available, Suction available and Patient being monitored Patient Re-evaluated:Patient Re-evaluated prior to induction Oxygen Delivery Method: Circle System Utilized Preoxygenation: Pre-oxygenation with 100% oxygen Induction Type: IV induction Ventilation: Mask ventilation without difficulty LMA: LMA inserted LMA Size: 5.0 Number of attempts: 1 Airway Equipment and Method: Bite block Placement Confirmation: positive ETCO2 Tube secured with: Tape Dental Injury: Teeth and Oropharynx as per pre-operative assessment  Comments: Wess Botts, SRNA placed LMA

## 2017-10-20 NOTE — H&P (Signed)
[name] is an 52 y.o. male.   Chief Complaint: Traumatic arthritis left ankle. HPI: Patient is a 52 year old gentleman with traumatic arthritis of his left ankle.  He is undergone prolonged conservative therapy including activity modifications injections without relief.  Patient presents at this time for left ankle fusion.  Past Medical History:  Diagnosis Date  . Anxiety   . Arthritis   . Asthma   . GERD (gastroesophageal reflux disease)   . Hypertension   . Pneumonia    hx  . Post-traumatic osteoarthritis of left ankle   . Pre-diabetes   . Schizophrenia ([place])   . Stroke [facility])    2009-or10    Past Surgical History:  Procedure Laterality Date  . ESOPHAGOGASTRODUODENOSCOPY Left 12/05/2013   Procedure: ESOPHAGOGASTRODUODENOSCOPY (EGD);  Surgeon: Arta Silence, MD;  Location: [facility];  Service: Endoscopy;  Laterality: Left;  . ESOPHAGOGASTRODUODENOSCOPY (EGD) WITH PROPOFOL N/A 02/20/2015   Procedure: ESOPHAGOGASTRODUODENOSCOPY (EGD) WITH PROPOFOL;  Surgeon: Wilford Corner, MD;  Location: [facility];  Service: Endoscopy;  Laterality: N/A;  . TOTAL KNEE ARTHROPLASTY Right 07/21/2016   Procedure: TOTAL KNEE ARTHROPLASTY;  Surgeon: Meredith Pel, MD;  Location: [facility];  Service: Orthopedics;  Laterality: Right;    Family History  Adopted: Yes   Social History:  reports that he has been smoking cigarettes.  He has a 6.50 pack-year smoking history. He has never used smokeless tobacco. He reports that he does not drink alcohol or use drugs.  Allergies: No Known Allergies  Medications Prior to Admission  Medication Sig Dispense Refill  . albuterol (PROVENTIL HFA;VENTOLIN HFA) 108 (90 Base) MCG/ACT inhaler Inhale 1 puff into the lungs 2 (two) times daily as needed for wheezing or shortness of breath. 1 Inhaler 1  . amLODipine (NORVASC) 2.5 MG tablet TAKE 1 TABLET BY MOUTH ONCE DAILY. (Patient taking differently: Take 2.5 mg by mouth daily. ) 28 tablet 3  . benztropine  (COGENTIN) 1 MG tablet Take 1 mg by mouth daily.    Marland Kitchen buPROPion (WELLBUTRIN XL) 150 MG 24 hr tablet TAKE 1 TABLET(150 MG) BY MOUTH DAILY (Patient taking differently: Take 150 mg by mouth daily. ) 30 tablet 2  . cetirizine (ZYRTEC) 10 MG tablet Take 1 tablet (10 mg total) by mouth daily. 30 tablet 11  . ibuprofen (ADVIL,MOTRIN) 200 MG tablet Take 800 mg by mouth every 6 (six) hours as needed for headache or moderate pain.    Marland Kitchen INVEGA TRINZA 819 MG/2.625ML SUSY Inject 819 mg into the skin every 3 (three) months.  3  . omeprazole (PRILOSEC) 40 MG capsule Take 1 capsule (40 mg total) by mouth daily. 30 capsule 11  . sucralfate (CARAFATE) 1 g tablet TAKE 1 TABLET FOUR TIMES DAILY FOR ACID REFLUX. (Patient taking differently: Take 1 g by mouth 4 (four) times daily. ) 112 tablet 0  . fluticasone (FLONASE) 50 MCG/ACT nasal spray SHAKE LIQUID AND USE 1 SPRAY IN EACH NOSTRIL DAILY (Patient not taking: Reported on 10/08/2017) 16 g 0  . losartan (COZAAR) 50 MG tablet Take 1 tablet (50 mg total) by mouth daily. 90 tablet 0  . mirtazapine (REMERON) 15 MG tablet Take 15 mg by mouth at bedtime.    . naproxen sodium (ANAPROX DS) 550 MG tablet Take 1 tablet (550 mg total) by mouth 2 (two) times daily with a meal. (Patient not taking: Reported on 10/08/2017) 30 tablet 1  . nicotine (NICODERM CQ - DOSED IN MG/24 HOURS) 21 mg/24hr patch Place 1 patch (21 mg  total) onto the skin daily. (Patient not taking: Reported on 10/08/2017) 28 patch 0    No results found for this or any previous visit (from the past 48 hour(s)). No results found.  Review of Systems  All other systems reviewed and are negative.   Blood pressure (!) 136/94, pulse (!) 110, temperature 98.4 F (36.9 C), temperature source Oral, resp. rate 20, height 5\' 9"  (1.753 m), weight 206 lb (93.4 kg), SpO2 98 %. Physical Exam  Patient is alert oriented no adenopathy well-dressed normal affect normal respiratory effort.  Patient is a good dorsalis pedis pulse  he has limited range of motion of the left ankle with pain with plantarflexion and dorsiflexion.  There is no redness no cellulitis no signs of infection. Assessment/Plan Assessment: Traumatic arthritis left ankle.  Plan: We will plan for arthroscopic left ankle fusion.  Risk and benefits were discussed including infection neurovascular injury persistent pain need for additional surgery.  Patient states he understands wished to proceed at this time.  Newt Minion, MD 10/20/2017, 6:44 AM

## 2017-10-20 NOTE — Progress Notes (Signed)
Orthopedic Tech Progress Note Patient Details:  [name] 1966-01-03 [id number]  Ortho Devices Type of Ortho Device: CAM walker Ortho Device/Splint Location: lle Ortho Device/Splint Interventions: Application   Post Interventions Patient Tolerated: Well Instructions Provided: Care of device   Hildred Priest 10/20/2017, 9:50 AM

## 2017-10-20 NOTE — Op Note (Signed)
10/20/2017  8:24 AM  PATIENT:  [name]    PRE-OPERATIVE DIAGNOSIS:  Post Traumatic Osteoarthritis Left Ankle  POST-OPERATIVE DIAGNOSIS:  Same  PROCEDURE:  LEFT ANKLE FUSION, ANKLE ARTHROSCOPY, C arm fluoroscopy  SURGEON:  Newt Minion, MD  PHYSICIAN ASSISTANT:None ANESTHESIA:   General  PREOPERATIVE INDICATIONS:  [name] is a  52 y.o. male with a diagnosis of Post Traumatic Osteoarthritis Left Ankle who failed conservative measures and elected for surgical management.    The risks benefits and alternatives were discussed with the patient preoperatively including but not limited to the risks of infection, bleeding, nerve injury, cardiopulmonary complications, the need for revision surgery, among others, and the patient was willing to proceed.  OPERATIVE IMPLANTS: 6.5 headless cannulated screws x2  @ENCIMAGES @  OPERATIVE FINDINGS: Severe traumatic arthritis left ankle  OPERATIVE PROCEDURE: Patient was brought to the operating room and underwent a general anesthetic after a popliteal block.  After adequate levels of anesthesia were obtained patient's left lower extremity was prepped using DuraPrep draped into a sterile field a timeout was called.  Patient received preoperative Kefzol.  A anterior medial and anterior lateral portal were established for the arthroscopic portals.  The skin was incised blunt dissection was carried down the capsule and a blunt trocar was used to insert into the capsule.  Visualization showed significant amount of synovitis and large overhanging osteophytic bone spurs.  The electrocautery was used to initially debride the synovitis followed by a shaver followed by the bur to debride the articular cartilage both medial lateral gutters were debrided arthroscopic portals were switched to allow for debridement of both gutters.  The cartilage was removed from the tibiotalar joint.  There is good bleeding petechial bone.  The scope was removed the ankle was  placed at 90 degrees skin incisions were made and K wires were inserted obliquely medially and laterally to fuse the tibiotalar joint.  C-arm fluoroscopy verified alignment verified the pins did not penetrate into the subtalar joint.  Headless cannulated screws 6.5 mm were inserted medially and laterally.  C-arm fluoroscopy in both the AP and lateral views showed stable fusion with no disruption of the subtalar joint.  The instruments were removed the portals were closed using 2-0 nylon.  A sterile compressive dressing was applied patient was extubated taken the PACU in stable condition.   DISCHARGE PLANNING:  Antibiotic duration: 24-hour antibiotics  Weightbearing: Nonweightbearing on the left  Pain medication: High-dose narcotic protocol  Dressing care/ Wound VAC: Dressing clean dry and intact  Ambulatory devices: Crutches or walker  Discharge to: Home.  Follow-up: In the office 1 week post operative.

## 2017-10-20 NOTE — Transfer of Care (Signed)
Immediate Anesthesia Transfer of Care Note  Patient: [name]  Procedure(s) Performed: LEFT ANKLE FUSION (Left ) ANKLE ARTHROSCOPY (Left )  Patient Location: PACU  Anesthesia Type:MAC  Level of Consciousness: awake, alert  and oriented  Airway & Oxygen Therapy: Patient Spontanous Breathing  Post-op Assessment: Report given to RN  Post vital signs: Reviewed and stable  Last Vitals:  Vitals Value Taken Time  BP    Temp    Pulse    Resp    SpO2      Last Pain:  Vitals:   10/20/17 0633  TempSrc:   PainSc: 8          Complications: No apparent anesthesia complications

## 2017-10-20 NOTE — Evaluation (Signed)
Physical Therapy Evaluation Patient Details Name: [name] MRN: [id number] DOB: [birth year] Today's Date: 10/20/2017   History of Present Illness  Pt is a 52 y.o. male s/p L ankle fusion on 10/20/17. PMH includes HTN, CVA, anxiety, schizophrenia, R TKA (06/2016).  Clinical Impression  Pt presents with an overall decrease in functional mobility secondary to above. PTA, pt indep and lives with fiance who will be available for 24/7 supervision. Today, pt fatigued with decreased attention and awareness; increased difficulty maintaining LLE NWB precautions despite max cues/education. Able to amb in room with RW and min guard, repeated cues for NWB. Required assist to don/doff cam walker boot. Pt declining stair training; will plan for this tomorrow as pt will have to step up curb in parking lot before entering level apartment. Pt would benefit from continued acute PT services to maximize functional mobility and independence prior to d/c with HHPT services.     Follow Up Recommendations Home health PT;Supervision for mobility/OOB    Equipment Recommendations  Rolling walker with 5" wheels    Recommendations for Other Services       Precautions / Restrictions Precautions Precautions: Fall Restrictions Weight Bearing Restrictions: Yes LLE Weight Bearing: Non weight bearing Other Position/Activity Restrictions: Cam walker boot      Mobility  Bed Mobility Overal bed mobility: Modified Independent             General bed mobility comments: Cues to not WB through LLE when bridging hips to scoot up in bed  Transfers Overall transfer level: Needs assistance Equipment used: Rolling walker (2 wheeled) Transfers: Sit to/from Stand Sit to Stand: Min guard         General transfer comment: Stood from bed and toilet with RW while resting L foot on ground despite max cues/education on technique. Able to perform additional sit<>stand from recliner while holding foot off  ground  Ambulation/Gait Ambulation/Gait assistance: Min guard Gait Distance (Feet): 20 Feet Assistive device: Rolling walker (2 wheeled) Gait Pattern/deviations: Step-to pattern Gait velocity: Decreased   General Gait Details: Pt ambulating to bathroom with urgency, WB through L foot despite max cues/education. After seated rest break, able to utilize hop-to pattern on RLE with max cues on technique for holding LLE in air  Stairs            Wheelchair Mobility    Modified Rankin (Stroke Patients Only)       Balance Overall balance assessment: Needs assistance   Sitting balance-[name]: Fair       Standing balance-[name]: Poor                               Pertinent Vitals/Pain Pain Assessment: Faces Faces Pain Scale: Hurts little more Pain Location: L ankle Pain Descriptors / Indicators: Sore Pain Intervention(s): Limited activity within patient's tolerance;Monitored during session    Home Living Family/patient expects to be discharged to:: Private residence Living Arrangements: Spouse/significant other Available Help at Discharge: Family;Available 24 hours/day Type of Home: Apartment Home Access: Stairs to enter   [employer] of Steps: Apartment level entry, but must navigate up curb in parking lot Home Layout: One level Home Equipment: None      Prior Function Level of Independence: Independent               Hand Dominance        Extremity/Trunk Assessment   Upper Extremity Assessment Upper Extremity Assessment: Overall WFL for  tasks assessed    Lower Extremity Assessment Lower Extremity Assessment: LLE deficits/detail LLE Deficits / Details: s/p L ankle fusion; hip/knee grossly 4/5 strength LLE: Unable to fully assess due to immobilization       Communication   Communication: No difficulties  Cognition Arousal/Alertness: Awake/alert Behavior During Therapy: Flat affect Overall Cognitive Status:  Impaired/Different from baseline Area of Impairment: Attention;Following commands;Safety/judgement;Awareness;Problem solving                   Current Attention Level: Sustained   Following Commands: Follows one step commands inconsistently Safety/Judgement: Decreased awareness of safety;Decreased awareness of deficits Awareness: Emergent Problem Solving: Requires verbal cues General Comments: Suspect cognitive impairments likely secondary to pain medications/fatigue. Pt does not seem to care he is putting WB through L ankle despite max cues/education      General Comments General comments (skin integrity, edema, etc.): Fiance present during session    Exercises General Exercises - Lower Extremity Long Arc Quad: AROM;Left;10 reps;Seated Hip Flexion/Marching: AROM;Left;5 reps;Seated   Assessment/Plan    PT Assessment Patient needs continued PT services  PT Problem List Decreased activity tolerance;Decreased balance;Decreased mobility;Decreased strength;Decreased safety awareness;Decreased knowledge of precautions;Decreased knowledge of use of DME       PT Treatment Interventions DME instruction;Gait training;Stair training;Functional mobility training;Therapeutic activities;Therapeutic exercise;Balance training;Patient/family education    PT Goals (Current goals can be found in the Care Plan section)  Acute Rehab PT Goals Patient Stated Goal: Return home tomorrow PT Goal Formulation: With patient Time For Goal Achievement: 11/03/17 Potential to Achieve Goals: Good    Frequency     Barriers to discharge        Co-evaluation               AM-PAC PT "6 Clicks" Daily Activity  Outcome Measure Difficulty turning over in bed (including adjusting bedclothes, sheets and blankets)?: None Difficulty moving from lying on back to sitting on the side of the bed? : None Difficulty sitting down on and standing up from a chair with arms (e.g., wheelchair, bedside commode,  etc,.)?: A Little Help needed moving to and from a bed to chair (including a wheelchair)?: A Little Help needed walking in hospital room?: A Little Help needed climbing 3-5 steps with a railing? : A Lot 6 Click Score: 19    End of Session Equipment Utilized During Treatment: Gait belt Activity Tolerance: Patient limited by fatigue Patient left: in bed;with call bell/phone within reach;with family/visitor present;with bed alarm set Nurse Communication: Mobility status PT Visit Diagnosis: Other abnormalities of gait and mobility (R26.89)    Time: 6578-4696 PT Time Calculation (min) (ACUTE ONLY): 27 min   Charges:   PT Evaluation $PT Eval Moderate Complexity: 1 Mod PT Treatments $Gait Training: 8-22 mins   PT G Codes:       Ina Homes, PT, DPT Acute Rehab Services  Pager: [phone]  Malachy Chamber 10/20/2017, 3:49 PM

## 2017-10-20 NOTE — Anesthesia Procedure Notes (Signed)
Anesthesia Regional Block: Popliteal block   Pre-Anesthetic Checklist: ,, timeout performed, Correct Patient, Correct Site, Correct Laterality, Correct Procedure, Correct Position, site marked, Risks and benefits discussed,  Surgical consent,  Pre-op evaluation,  At surgeon's request and post-op pain management  Laterality: Left  Prep: chloraprep       Needles:  Injection technique: Single-shot  Needle Type: Echogenic Stimulator Needle     Needle Length: 5cm  Needle Gauge: 22     Additional Needles:   Procedures:, nerve stimulator,,, ultrasound used (permanent image in chart),,,,   Nerve Stimulator or Paresthesia:  Response: gastroc, 0.45 mA,   Additional Responses:   Narrative:  Start time: 10/20/2017 7:00 AM End time: 10/20/2017 7:05 AM Injection made incrementally with aspirations every 5 mL.  Performed by: Personally  Anesthesiologist: Janeece Riggers, MD  Additional Notes: Functioning IV was confirmed and monitors were applied.  A 45mm 22ga Arrow echogenic stimulator needle was used. Sterile prep and drape,hand hygiene and sterile gloves were used. Ultrasound guidance: relevant anatomy identified, needle position confirmed, local anesthetic spread visualized around nerve(s)., vascular puncture avoided.  Image printed for medical record. Negative aspiration and negative test dose prior to incremental administration of local anesthetic. The patient tolerated the procedure well.

## 2017-10-20 NOTE — Anesthesia Postprocedure Evaluation (Signed)
Anesthesia Post Note  Patient: [name]  Procedure(s) Performed: LEFT ANKLE FUSION (Left ) ANKLE ARTHROSCOPY (Left )     Patient location during evaluation: PACU Anesthesia Type: General Level of consciousness: awake and alert Pain management: pain level controlled Vital Signs Assessment: post-procedure vital signs reviewed and stable Respiratory status: spontaneous breathing, nonlabored ventilation, respiratory function stable and patient connected to nasal cannula oxygen Cardiovascular status: blood pressure returned to baseline and stable Postop Assessment: no apparent nausea or vomiting Anesthetic complications: no    Last Vitals:  Vitals:   10/20/17 0913 10/20/17 0942  BP: (!) 124/94 122/90  Pulse:  (!) 109  Resp:  14  Temp: 36.6 C   SpO2: 100% 99%    Last Pain:  Vitals:   10/20/17 0913  TempSrc:   PainSc: 8                  Conny Moening

## 2017-10-21 ENCOUNTER — Encounter (HOSPITAL_COMMUNITY): Payer: Self-pay | Admitting: Orthopedic Surgery

## 2017-10-21 DIAGNOSIS — M19172 Post-traumatic osteoarthritis, left ankle and foot: Secondary | ICD-10-CM | POA: Diagnosis not present

## 2017-10-21 DIAGNOSIS — R7303 Prediabetes: Secondary | ICD-10-CM | POA: Diagnosis not present

## 2017-10-21 DIAGNOSIS — Z981 Arthrodesis status: Secondary | ICD-10-CM | POA: Diagnosis not present

## 2017-10-21 DIAGNOSIS — I10 Essential (primary) hypertension: Secondary | ICD-10-CM | POA: Diagnosis not present

## 2017-10-21 DIAGNOSIS — K219 Gastro-esophageal reflux disease without esophagitis: Secondary | ICD-10-CM | POA: Diagnosis not present

## 2017-10-21 DIAGNOSIS — F419 Anxiety disorder, unspecified: Secondary | ICD-10-CM | POA: Diagnosis not present

## 2017-10-21 MED ORDER — OXYCODONE HCL 5 MG PO TABS: 5.0000 mg | ORAL_TABLET | ORAL | 0 refills | Status: DC | PRN

## 2017-10-21 NOTE — Progress Notes (Signed)
Physical Therapy Treatment Patient Details Name: [name] MRN: [id number] DOB: [birth year] Today's Date: 10/21/2017    History of Present Illness Pt is a 52 y.o. male s/p L ankle fusion on 10/20/17. PMH includes HTN, CVA, anxiety, schizophrenia, R TKA (06/2016).    PT Comments    Pt performed gait to and from curb step to perform curb negotiation.  Pt sleeping on arrival and required total assistance to donne CAM walker.  Plan for d/c home later this afternoon.  Pt tolerated curb training well.     Follow Up Recommendations  Home health PT;Supervision for mobility/OOB     Equipment Recommendations  Rolling walker with 5" wheels    Recommendations for Other Services       Precautions / Restrictions Precautions Precautions: Fall Restrictions Weight Bearing Restrictions: Yes LLE Weight Bearing: Non weight bearing Other Position/Activity Restrictions: Cam walker boot    Mobility  Bed Mobility Overal bed mobility: Modified Independent             General bed mobility comments: no assistance  Transfers Overall transfer level: Needs assistance Equipment used: Rolling walker (2 wheeled) Transfers: Sit to/from Stand Sit to Stand: Min guard         General transfer comment: Cues for hand placement to and from seated surface.  Cues for weight bearing.    Ambulation/Gait Ambulation/Gait assistance: Min guard Gait Distance (Feet): 10 Feet Assistive device: Rolling walker (2 wheeled) Gait Pattern/deviations: Step-to pattern Gait velocity: Decreased   General Gait Details: Pt performed gait from bed to curb step in room and back.  Able to maintain NWB with CAM boot donned.  Performed hop to pattern.     Stairs Stairs: Yes   Stair Management: No rails;Backwards;Forwards;With walker Number of Stairs: 2 General stair comments: Cues for sequencing and curb negotiation.  Pt performed x2 reps to ensure safe technique.  Backwards to ascend and forwards to descend.      Wheelchair Mobility    Modified Rankin (Stroke Patients Only)       Balance Overall balance assessment: Needs assistance   Sitting balance-[name]: Fair       Standing balance-[name]: Poor                              Cognition Arousal/Alertness: Awake/alert Behavior During Therapy: Flat affect Overall Cognitive Status: Impaired/Different from baseline Area of Impairment: Attention;Following commands;Safety/judgement;Awareness;Problem solving                   Current Attention Level: Sustained   Following Commands: Follows one step commands inconsistently Safety/Judgement: Decreased awareness of safety;Decreased awareness of deficits Awareness: Emergent Problem Solving: Requires verbal cues General Comments: Suspect cognitive impairments likely secondary to pain medications/fatigue. Pt does not seem to care he is putting WB through L ankle despite max cues/education      Exercises      General Comments        Pertinent Vitals/Pain Pain Assessment: 0-10 Pain Score: 3  Pain Location: L ankle Pain Descriptors / Indicators: Sore Pain Intervention(s): Monitored during session;Repositioned    Home Living                      Prior Function            PT Goals (current goals can now be found in the care plan section) Acute Rehab PT Goals Patient Stated Goal: Return home tomorrow Potential  to Achieve Goals: Good Progress towards PT goals: Progressing toward goals    Frequency           PT Plan Current plan remains appropriate    Co-evaluation              AM-PAC PT "6 Clicks" Daily Activity  Outcome Measure  Difficulty turning over in bed (including adjusting bedclothes, sheets and blankets)?: None Difficulty moving from lying on back to sitting on the side of the bed? : None Difficulty sitting down on and standing up from a chair with arms (e.g., wheelchair, bedside commode, etc,.)?: A Little Help  needed moving to and from a bed to chair (including a wheelchair)?: A Little Help needed walking in hospital room?: A Little Help needed climbing 3-5 steps with a railing? : A Little 6 Click Score: 20    End of Session Equipment Utilized During Treatment: Gait belt Activity Tolerance: Patient limited by fatigue Patient left: in bed;with call bell/phone within reach;with family/visitor present;with bed alarm set Nurse Communication: Mobility status PT Visit Diagnosis: Other abnormalities of gait and mobility (R26.89)     Time: 5784-6962 PT Time Calculation (min) (ACUTE ONLY): 22 min  Charges:  $Gait Training: 8-22 mins                     Joycelyn Rua, PTA pager [phone]    Florestine Avers 10/21/2017, 1:41 PM

## 2017-10-21 NOTE — Discharge Summary (Signed)
Discharge Diagnoses:  Active Problems:   Traumatic osteoarthritis of left ankle   S/P ankle fusion   Surgeries: Procedure(s): LEFT ANKLE FUSION ANKLE ARTHROSCOPY on 10/20/2017    Consultants:   Discharged Condition: Improved  Hospital Course: [name] is an 52 y.o. male who was admitted 10/20/2017 with a chief complaint of traumatic arthritis left ankle, with a final diagnosis of Post Traumatic Osteoarthritis Left Ankle.  Patient was brought to the operating room on 10/20/2017 and underwent Procedure(s): LEFT ANKLE FUSION ANKLE ARTHROSCOPY.    Patient was given perioperative antibiotics:  Anti-infectives (From admission, onward)   Start     Dose/Rate Route Frequency Ordered Stop   10/20/17 1400  ceFAZolin (ANCEF) IVPB 2g/100 mL premix     2 g 200 mL/hr over 30 Minutes Intravenous Every 6 hours 10/20/17 1055 10/21/17 0216   10/20/17 0630  ceFAZolin (ANCEF) IVPB 2g/100 mL premix     2 g 200 mL/hr over 30 Minutes Intravenous On call to O.R. 10/20/17 0240 10/20/17 0755   10/20/17 0619  ceFAZolin (ANCEF) 2-4 GM/100ML-% IVPB    Note to Pharmacy:  Nyoka Cowden   : cabinet override      10/20/17 9735 10/20/17 0725    .  Patient was given sequential compression devices, early ambulation, and aspirin for DVT prophylaxis.  Recent vital signs:  Patient Vitals for the past 24 hrs:  BP Temp Temp src Pulse Resp SpO2  10/21/17 0354 (!) 138/94 98.7 F (37.1 C) Oral (!) 102 18 99 %  10/21/17 0050 (!) 143/87 98.3 F (36.8 C) Oral (!) 107 18 97 %  10/20/17 1934 131/85 98.4 F (36.9 C) Oral (!) 105 18 99 %  10/20/17 1431 (!) 130/92 98.2 F (36.8 C) Oral 100 16 97 %  10/20/17 1050 (!) 149/102 - - 98 16 100 %  10/20/17 1027 (!) 135/91 - - (!) 106 (!) 24 100 %  10/20/17 1012 (!) 129/97 - - (!) 106 (!) 30 100 %  10/20/17 1009 - - - (!) 116 (!) 21 100 %  10/20/17 1000 124/88 - - (!) 116 17 100 %  10/20/17 0945 - - - (!) 108 13 99 %  10/20/17 0942 122/90 - - (!) 109 14 99 %   10/20/17 0927 - - - (!) 108 13 100 %  10/20/17 0925 (!) 126/96 - - (!) 112 (!) 22 99 %  10/20/17 0922 125/85 - - (!) 105 15 100 %  10/20/17 0913 (!) 124/94 97.9 F (36.6 C) - - - 100 %  .  Recent laboratory studies: No results found.  Discharge Medications:   Allergies as of 10/21/2017   No Known Allergies     Medication List    TAKE these medications   albuterol 108 (90 Base) MCG/ACT inhaler Commonly known as:  PROVENTIL HFA;VENTOLIN HFA Inhale 1 puff into the lungs 2 (two) times daily as needed for wheezing or shortness of breath.   amLODipine 2.5 MG tablet Commonly known as:  NORVASC TAKE 1 TABLET BY MOUTH ONCE DAILY. What changed:    how much to take  how to take this  when to take this  additional instructions   benztropine 1 MG tablet Commonly known as:  COGENTIN Take 1 mg by mouth daily.   buPROPion 150 MG 24 hr tablet Commonly known as:  WELLBUTRIN XL TAKE 1 TABLET(150 MG) BY MOUTH DAILY What changed:    how much to take  how to take this  when to take  this  additional instructions   cetirizine 10 MG tablet Commonly known as:  ZYRTEC Take 1 tablet (10 mg total) by mouth daily.   fluticasone 50 MCG/ACT nasal spray Commonly known as:  FLONASE SHAKE LIQUID AND USE 1 SPRAY IN EACH NOSTRIL DAILY   ibuprofen 200 MG tablet Commonly known as:  ADVIL,MOTRIN Take 800 mg by mouth every 6 (six) hours as needed for headache or moderate pain.   INVEGA TRINZA 819 MG/2.625ML Susy Generic drug:  Paliperidone Palmitate ER Inject 819 mg into the skin every 3 (three) months.   losartan 50 MG tablet Commonly known as:  COZAAR Take 1 tablet (50 mg total) by mouth daily.   mirtazapine 15 MG tablet Commonly known as:  REMERON Take 15 mg by mouth at bedtime.   naproxen sodium 550 MG tablet Commonly known as:  ANAPROX DS Take 1 tablet (550 mg total) by mouth 2 (two) times daily with a meal.   nicotine 21 mg/24hr patch Commonly known as:  NICODERM CQ -  dosed in mg/24 hours Place 1 patch (21 mg total) onto the skin daily.   omeprazole 40 MG capsule Commonly known as:  PRILOSEC Take 1 capsule (40 mg total) by mouth daily.   oxyCODONE 5 MG immediate release tablet Commonly known as:  Oxy IR/ROXICODONE Take 1 tablet (5 mg total) by mouth every 4 (four) hours as needed for severe pain.   sucralfate 1 g tablet Commonly known as:  CARAFATE TAKE 1 TABLET FOUR TIMES DAILY FOR ACID REFLUX. What changed:    how much to take  how to take this  when to take this  additional instructions            Discharge Care Instructions  (From admission, onward)        Start     Ordered   10/21/17 0000  Non weight bearing    Question Answer Comment  Laterality left   Extremity Lower      10/21/17 0809      Diagnostic Studies: No results found.  Patient benefited maximally from their hospital stay and there were no complications.     Disposition:  Discharge Instructions    Non weight bearing   Complete by:  As directed    Laterality:  left   Extremity:  Lower   Walker rolling   Complete by:  As directed      Follow-up Information    Newt Minion, MD In 1 week.   Specialty:  Orthopedic Surgery Contact information: [address] [place] [address] [phone]            Signed: Newt Minion 10/21/2017, 8:09 AM

## 2017-10-21 NOTE — Plan of Care (Signed)

## 2017-10-26 ENCOUNTER — Telehealth (INDEPENDENT_AMBULATORY_CARE_PROVIDER_SITE_OTHER): Payer: Self-pay | Admitting: Orthopedic Surgery

## 2017-10-26 NOTE — Telephone Encounter (Signed)
Pt is s/p an ankle fusion 10/20/17. Pt was given rx for Oxycodone 5 mg. I called the pt and advised that he can use ice 15 mon at a time 3-4 times during the day, encouraged to keep foot elevated higher than his heart, advised he could loosen or remove the coban dressing and that he should remain non weight bearing. Pt has follow up in the office and will call with any other questions.

## 2017-10-26 NOTE — Telephone Encounter (Signed)
Patient request something stronger states the Oxycodone is not working for the pain

## 2017-10-29 ENCOUNTER — Encounter

## 2017-11-01 ENCOUNTER — Ambulatory Visit (INDEPENDENT_AMBULATORY_CARE_PROVIDER_SITE_OTHER): Payer: Medicare Other | Admitting: Orthopedic Surgery

## 2017-11-01 ENCOUNTER — Encounter (INDEPENDENT_AMBULATORY_CARE_PROVIDER_SITE_OTHER): Payer: Self-pay | Admitting: Orthopedic Surgery

## 2017-11-01 ENCOUNTER — Ambulatory Visit (INDEPENDENT_AMBULATORY_CARE_PROVIDER_SITE_OTHER): Payer: Medicare Other

## 2017-11-01 DIAGNOSIS — M19172 Post-traumatic osteoarthritis, left ankle and foot: Secondary | ICD-10-CM

## 2017-11-01 DIAGNOSIS — M25572 Pain in left ankle and joints of left foot: Secondary | ICD-10-CM | POA: Diagnosis not present

## 2017-11-01 MED ORDER — OXYCODONE-ACETAMINOPHEN 5-325 MG PO TABS: 1.0000 | ORAL_TABLET | Freq: Four times a day (QID) | ORAL | 0 refills | Status: DC | PRN

## 2017-11-01 NOTE — Progress Notes (Signed)
Office Visit Note   Patient: [name]           Date of Birth: [birth year]           MRN: [id number] Visit Date: 11/01/2017              Requested by: Lovena Neighbours, MD [address] [place], [place] [address] PCP: Lovena Neighbours, MD  Chief Complaint  Patient presents with  . Left Ankle - Routine Post Op    10/20/17 left ankle arthroscopy and fusion      HPI: Patient is a 52 year old gentleman who was seen in follow-up for arthroscopic fusion left ankle.  Patient states that his walker slipped and he fell about 3 to 4 days ago in the bathroom at his home.  He feels like he twisted his ankle.  Assessment & Plan: Visit Diagnoses:  1. Pain in left ankle and joints of left foot   2. Traumatic osteoarthritis of left ankle     Plan: Continue with the walker continue with the fracture boot continue strict nonweightbearing on the left.  Repeat three-view radiographs of the left ankle at follow-up.  Follow-Up Instructions: Return in about 3 weeks (around 11/22/2017).   Ortho Exam  Patient is alert, oriented, no adenopathy, well-dressed, normal affect, normal respiratory effort. Examination there is minimal swelling incisions are healing nicely we will harvest the sutures today scription for Percocet for pain follow-up in the office in 3 weeks  Imaging: Xr Ankle Complete Left  Result Date: 11/01/2017 3 view radiographs of the left ankle shows stable internal fixation for left ankle fusion.  The foot is plantigrade.  No images are attached to the encounter.  Labs: Lab Results  Component Value Date   HGBA1C 5.6 11/05/2016   HGBA1C 6.0 (H) 07/10/2016   HGBA1C 5.8 04/10/2016   ESRSEDRATE 9 11/05/2016   ESRSEDRATE 7 10/26/2016   ESRSEDRATE 16 08/12/2016   CRP 21.9 (H) 11/05/2016   CRP 82.8 (H) 10/26/2016   LABURIC 5.2 04/17/2014   REPTSTATUS 07/12/2016 FINAL 07/10/2016   GRAMSTAIN Rare 10/29/2016   GRAMSTAIN WBC present-both PMN and Mononuclear 10/29/2016   GRAMSTAIN No Organisms Seen 10/29/2016   CULT 70,000 COLONIES/mL CITROBACTER KOSERI (A) 07/10/2016   LABORGA CITROBACTER KOSERI (A) 07/10/2016     Lab Results  Component Value Date   ALBUMIN 4.3 06/29/2017   ALBUMIN 4.1 04/28/2017   ALBUMIN 3.8 11/05/2016   LABURIC 5.2 04/17/2014    There is no height or weight on file to calculate BMI.  Orders:  Orders Placed This Encounter  Procedures  . XR Ankle Complete Left   Meds ordered this encounter  Medications  . oxyCODONE-acetaminophen (PERCOCET/ROXICET) 5-325 MG tablet    Sig: Take 1 tablet by mouth every 6 (six) hours as needed.    Dispense:  30 tablet    Refill:  0     Procedures: No procedures performed  Clinical Data: No additional findings.  ROS:  All other systems negative, except as noted in the HPI. Review of Systems  Objective: Vital Signs: There were no vitals taken for this visit.  Specialty Comments:  No specialty comments available.  PMFS History: Patient Active Problem List   Diagnosis Date Noted  . S/P ankle fusion 10/20/2017  . Traumatic osteoarthritis of left ankle   . Screening for colon cancer 06/29/2017  . Seasonal allergies 06/29/2017  . Acute bacterial sinusitis 05/01/2017  . Tachycardia 05/01/2017  . Post-traumatic osteoarthritis, left ankle and foot 01/07/2017  .  Presence of right artificial knee joint 12/17/2016  . Pain in left ankle and joints of left foot 12/17/2016  . At risk for adverse drug event 07/28/2016  . Primary osteoarthritis of right knee 05/27/2016  . Upper GI bleed 02/18/2015  . Benign essential HTN 02/18/2015  . Anemia of chronic disease 02/18/2015  . Pulmonary nodule 02/18/2015  . Alcohol dependence with intoxication with complication (HCC)   . Major depressive disorder, recurrent episode, moderate (HCC)   . Cocaine abuse (HCC) 03/02/2014  . History of schizophrenia 03/01/2014  . Tobacco abuse 12/06/2013  . Hematemesis 12/04/2013   Past Medical History:    Diagnosis Date  . Anxiety   . Arthritis   . Asthma   . GERD (gastroesophageal reflux disease)   . Hypertension   . Pneumonia    hx  . Post-traumatic osteoarthritis of left ankle   . Pre-diabetes   . Schizophrenia (HCC)   . Stroke [facility])    2009-or10    Family History  Adopted: Yes    Past Surgical History:  Procedure Laterality Date  . ANKLE ARTHROSCOPY Left 10/20/2017   Procedure: ANKLE ARTHROSCOPY;  Surgeon: Nadara Mustard, MD;  Location[facility] OR;  Service: Orthopedics;  Laterality: Left;  . ANKLE FUSION Left 10/20/2017  . ANKLE FUSION Left 10/20/2017   Procedure: LEFT ANKLE FUSION;  Surgeon: Nadara Mustard, MD;  Location: [facility] OR;  Service: Orthopedics;  Laterality: Left;  . ESOPHAGOGASTRODUODENOSCOPY Left 12/05/2013   Procedure: ESOPHAGOGASTRODUODENOSCOPY (EGD);  Surgeon: Willis Modena, MD;  Location: [facility];  Service: Endoscopy;  Laterality: Left;  . ESOPHAGOGASTRODUODENOSCOPY (EGD) WITH PROPOFOL N/A 02/20/2015   Procedure: ESOPHAGOGASTRODUODENOSCOPY (EGD) WITH PROPOFOL;  Surgeon: Charlott Rakes, MD;  Location: [facility] ENDOSCOPY;  Service: Endoscopy;  Laterality: N/A;  . TOTAL KNEE ARTHROPLASTY Right 07/21/2016   Procedure: TOTAL KNEE ARTHROPLASTY;  Surgeon: Cammy Copa, MD;  Location: [facility] OR;  Service: Orthopedics;  Laterality: Right;   Social History   Occupational History  . Not on file  Tobacco Use  . Smoking status: Heavy Tobacco Smoker    Packs/day: 0.50    Years: 13.00    Pack years: 6.50    Types: Cigarettes  . Smokeless tobacco: Never Used  . Tobacco comment: onset age 25; upto 1.5 ppd  Substance and Sexual Activity  . Alcohol use: No    Alcohol/week: 0.0 oz    Comment: quit 2015; formerly up to 2 fifths/day  . Drug use: No  . Sexual activity: Yes

## 2017-11-15 ENCOUNTER — Other Ambulatory Visit (INDEPENDENT_AMBULATORY_CARE_PROVIDER_SITE_OTHER): Payer: Self-pay | Admitting: Orthopedic Surgery

## 2017-11-15 ENCOUNTER — Telehealth (INDEPENDENT_AMBULATORY_CARE_PROVIDER_SITE_OTHER): Payer: Self-pay | Admitting: Orthopedic Surgery

## 2017-11-15 MED ORDER — OXYCODONE-ACETAMINOPHEN 5-325 MG PO TABS: 1.0000 | ORAL_TABLET | Freq: Four times a day (QID) | ORAL | 0 refills | Status: DC | PRN

## 2017-11-15 NOTE — Telephone Encounter (Signed)
rx writen

## 2017-11-15 NOTE — Telephone Encounter (Signed)
Rx refill Oxycodone 5mg 

## 2017-11-15 NOTE — Telephone Encounter (Signed)
Pt is s/p left ankle fusion 10/20/17 and would like a refill on percocet. Last refill 11/01/17 percocet 5/325 #30

## 2017-11-16 NOTE — Telephone Encounter (Signed)
I called and advised pt that rx is at the front desk for pick up.

## 2017-11-22 ENCOUNTER — Encounter (INDEPENDENT_AMBULATORY_CARE_PROVIDER_SITE_OTHER): Payer: Self-pay | Admitting: Orthopedic Surgery

## 2017-11-22 ENCOUNTER — Ambulatory Visit (INDEPENDENT_AMBULATORY_CARE_PROVIDER_SITE_OTHER): Payer: Medicare Other | Admitting: Orthopedic Surgery

## 2017-11-22 ENCOUNTER — Ambulatory Visit (INDEPENDENT_AMBULATORY_CARE_PROVIDER_SITE_OTHER): Payer: Medicare Other

## 2017-11-22 VITALS — Ht 69.0 in | Wt 206.0 lb

## 2017-11-22 DIAGNOSIS — M25572 Pain in left ankle and joints of left foot: Secondary | ICD-10-CM

## 2017-11-22 NOTE — Progress Notes (Signed)
Office Visit Note   Patient: [name]           Date of Birth: Aug 21, 1965           MRN: [id number] Visit Date: 11/22/2017              Requested by: Lovena Neighbours, MD [address] [place], [place] [address] PCP: Lovena Neighbours, MD  Chief Complaint  Patient presents with  . Left Ankle - Routine Post Op    10/20/17 left ankle scope and fusion       HPI: Patient is a 52 year old gentleman who presents status post left ankle arthroscopic fusion.  Patient is full weightbearing in the fracture boot and a cane.  Patient states that he fell Friday he called the office and was recommended to follow-up today for evaluation.  Patient states that he did run a temperature of 102 over the weekend.  Patient feels like he may have messed something up when he fell.  His last Percocet No. 30 was a week ago.  Assessment & Plan: Visit Diagnoses:  1. Pain in left ankle and joints of left foot     Plan: Recommended minimizing his weightbearing use the fracture boot at all times patient has no signs or symptoms of infection.  Repeat three-view radiographs of the left ankle at follow-up.  Follow-Up Instructions: Return in about 4 weeks (around 12/20/2017).   Ortho Exam  Patient is alert, oriented, no adenopathy, well-dressed, normal affect, normal respiratory effort. Examination patient's foot is plantigrade there is some swelling but there is no cellulitis no open wounds no signs of infection.  Patient's temperature is 97.4.  Patient denies any history of gout.  Patient's uric acid was 5.2, 3 years ago  Imaging: Xr Ankle Complete Left  Result Date: 11/22/2017 2 view radiographs of the left ankle shows a stable fusion no hardware failure no loss of reduction.  No images are attached to the encounter.  Labs: Lab Results  Component Value Date   HGBA1C 5.6 11/05/2016   HGBA1C 6.0 (H) 07/10/2016   HGBA1C 5.8 04/10/2016   ESRSEDRATE 9 11/05/2016   ESRSEDRATE 7 10/26/2016   ESRSEDRATE 16 08/12/2016   CRP 21.9 (H) 11/05/2016   CRP 82.8 (H) 10/26/2016   LABURIC 5.2 04/17/2014   REPTSTATUS 07/12/2016 FINAL 07/10/2016   GRAMSTAIN Rare 10/29/2016   GRAMSTAIN WBC present-both PMN and Mononuclear 10/29/2016   GRAMSTAIN No Organisms Seen 10/29/2016   CULT 70,000 COLONIES/mL CITROBACTER KOSERI (A) 07/10/2016   LABORGA CITROBACTER KOSERI (A) 07/10/2016     Lab Results  Component Value Date   ALBUMIN 4.3 06/29/2017   ALBUMIN 4.1 04/28/2017   ALBUMIN 3.8 11/05/2016   LABURIC 5.2 04/17/2014    Body mass index is 30.42 kg/m.  Orders:  Orders Placed This Encounter  Procedures  . XR Ankle Complete Left   No orders of the defined types were placed in this encounter.    Procedures: No procedures performed  Clinical Data: No additional findings.  ROS:  All other systems negative, except as noted in the HPI. Review of Systems  Objective: Vital Signs: Ht 5\' 9"  (1.753 m)   Wt 206 lb (93.4 kg)   BMI 30.42 kg/m   Specialty Comments:  No specialty comments available.  PMFS History: Patient Active Problem List   Diagnosis Date Noted  . S/P ankle fusion 10/20/2017  . Traumatic osteoarthritis of left ankle   . Screening for colon cancer 06/29/2017  . Seasonal allergies 06/29/2017  .  Acute bacterial sinusitis 05/01/2017  . Tachycardia 05/01/2017  . Post-traumatic osteoarthritis, left ankle and foot 01/07/2017  . Presence of right artificial knee joint 12/17/2016  . Pain in left ankle and joints of left foot 12/17/2016  . At risk for adverse drug event 07/28/2016  . Primary osteoarthritis of right knee 05/27/2016  . Upper GI bleed 02/18/2015  . Benign essential HTN 02/18/2015  . Anemia of chronic disease 02/18/2015  . Pulmonary nodule 02/18/2015  . Alcohol dependence with intoxication with complication (HCC)   . Major depressive disorder, recurrent episode, moderate (HCC)   . Cocaine abuse (HCC) 03/02/2014  . History of schizophrenia  03/01/2014  . Tobacco abuse 12/06/2013  . Hematemesis 12/04/2013   Past Medical History:  Diagnosis Date  . Anxiety   . Arthritis   . Asthma   . GERD (gastroesophageal reflux disease)   . Hypertension   . Pneumonia    hx  . Post-traumatic osteoarthritis of left ankle   . Pre-diabetes   . Schizophrenia (HCC)   . Stroke [facility])    2009-or10    Family History  Adopted: Yes    Past Surgical History:  Procedure Laterality Date  . ANKLE ARTHROSCOPY Left 10/20/2017   Procedure: ANKLE ARTHROSCOPY;  Surgeon: Nadara Mustard, MD;  Location: [facility] OR;  Service: Orthopedics;  Laterality: Left;  . ANKLE FUSION Left 10/20/2017  . ANKLE FUSION Left 10/20/2017   Procedure: LEFT ANKLE FUSION;  Surgeon: Nadara Mustard, MD;  Location[facility] OR;  Service: Orthopedics;  Laterality: Left;  . ESOPHAGOGASTRODUODENOSCOPY Left 12/05/2013   Procedure: ESOPHAGOGASTRODUODENOSCOPY (EGD);  Surgeon: Willis Modena, MD;  Location: [facility];  Service: Endoscopy;  Laterality: Left;  . ESOPHAGOGASTRODUODENOSCOPY (EGD) WITH PROPOFOL N/A 02/20/2015   Procedure: ESOPHAGOGASTRODUODENOSCOPY (EGD) WITH PROPOFOL;  Surgeon: Charlott Rakes, MD;  Location: [facility] ENDOSCOPY;  Service: Endoscopy;  Laterality: N/A;  . TOTAL KNEE ARTHROPLASTY Right 07/21/2016   Procedure: TOTAL KNEE ARTHROPLASTY;  Surgeon: Cammy Copa, MD;  Location: [facility] OR;  Service: Orthopedics;  Laterality: Right;   Social History   Occupational History  . Not on file  Tobacco Use  . Smoking status: Heavy Tobacco Smoker    Packs/day: 0.50    Years: 13.00    Pack years: 6.50    Types: Cigarettes  . Smokeless tobacco: Never Used  . Tobacco comment: onset age 58; upto 1.5 ppd  Substance and Sexual Activity  . Alcohol use: No    Alcohol/week: 0.0 standard drinks    Comment: quit 2015; formerly up to 2 fifths/day  . Drug use: No  . Sexual activity: Yes

## 2017-11-24 ENCOUNTER — Telehealth (INDEPENDENT_AMBULATORY_CARE_PROVIDER_SITE_OTHER): Payer: Self-pay | Admitting: Orthopedic Surgery

## 2017-11-24 NOTE — Telephone Encounter (Signed)
Pt is s/p a left ankle scope and fusion 10/20/17 requesting refill on percocet 5/325. Last refill was 8/19 #30 and 8/5 #30 please advise.

## 2017-11-24 NOTE — Telephone Encounter (Signed)
Too soon for refill, needs to use ice and elevation

## 2017-11-24 NOTE — Telephone Encounter (Signed)
Patient called needing Rx refilled (Oxycodone) The number to contact patient is [phone]

## 2017-11-25 ENCOUNTER — Other Ambulatory Visit (INDEPENDENT_AMBULATORY_CARE_PROVIDER_SITE_OTHER): Payer: Self-pay | Admitting: Orthopedic Surgery

## 2017-11-25 MED ORDER — OXYCODONE-ACETAMINOPHEN 5-325 MG PO TABS: 1.0000 | ORAL_TABLET | Freq: Three times a day (TID) | ORAL | 0 refills | Status: DC | PRN

## 2017-11-25 NOTE — Telephone Encounter (Signed)
rx written, he must elevate his leg, keep weight off his leg, use ice for swelling, wean off narcotics

## 2017-11-25 NOTE — Telephone Encounter (Signed)
Patient not understanding how it's too soon. He ran out today. Advised of note below. Advided about ice and elevating.  Please cal;l patient back to advise DUDA's note. [phone]

## 2017-11-25 NOTE — Telephone Encounter (Signed)
IC NA, will try to call again later.

## 2017-11-25 NOTE — Telephone Encounter (Signed)
See note from patient, Rx was written 1 po q 6 hrs, #30 on 11/15/17.  When can you approve a refill for him?

## 2017-11-26 NOTE — Telephone Encounter (Signed)
IC patient and advised all, he understands, Rx at front for pickup

## 2017-12-01 ENCOUNTER — Ambulatory Visit (AMBULATORY_SURGERY_CENTER): Payer: Medicare Other | Admitting: Internal Medicine

## 2017-12-01 ENCOUNTER — Encounter: Payer: Self-pay | Admitting: Internal Medicine

## 2017-12-01 VITALS — BP 131/94 | HR 104 | Temp 99.5°F | Resp 17 | Ht 69.0 in | Wt 206.0 lb

## 2017-12-01 DIAGNOSIS — R1013 Epigastric pain: Secondary | ICD-10-CM | POA: Diagnosis not present

## 2017-12-01 DIAGNOSIS — K219 Gastro-esophageal reflux disease without esophagitis: Secondary | ICD-10-CM

## 2017-12-01 MED ORDER — SODIUM CHLORIDE 0.9 % IV SOLN: 500.0000 mL | Freq: Once | INTRAVENOUS | Status: DC

## 2017-12-01 NOTE — Patient Instructions (Signed)
Continue Omeprazole 40mg  everyday. STOP smoking.  YOU HAD AN ENDOSCOPIC PROCEDURE TODAY AT [facility]:   Refer to the procedure report that was given to you for any specific questions about what was found during the examination.  If the procedure report does not answer your questions, please call your gastroenterologist to clarify.  If you requested that your care partner not be given the details of your procedure findings, then the procedure report has been included in a sealed envelope for you to review at your convenience later.  YOU SHOULD EXPECT: Some feelings of bloating in the abdomen. Passage of more gas than usual.  Walking can help get rid of the air that was put into your GI tract during the procedure and reduce the bloating. If you had a lower endoscopy (such as a colonoscopy or flexible sigmoidoscopy) you may notice spotting of blood in your stool or on the toilet paper. If you underwent a bowel prep for your procedure, you may not have a normal bowel movement for a few days.  Please Note:  You might notice some irritation and congestion in your nose or some drainage.  This is from the oxygen used during your procedure.  There is no need for concern and it should clear up in a day or so.  SYMPTOMS TO REPORT IMMEDIATELY:    Following upper endoscopy (EGD)  Vomiting of blood or coffee ground material  New chest pain or pain under the shoulder blades  Painful or persistently difficult swallowing  New shortness of breath  Fever of 100F or higher  Black, tarry-looking stools  For urgent or emergent issues, a gastroenterologist can be reached at any hour by calling [phone].   DIET:  We do recommend a small meal at first, but then you may proceed to your regular diet.  Drink plenty of fluids but you should avoid alcoholic beverages for 24 hours.  ACTIVITY:  You should plan to take it easy for the rest of today and you should NOT DRIVE or use heavy machinery  until tomorrow (because of the sedation medicines used during the test).    FOLLOW UP: Our staff will call the number listed on your records the next business day following your procedure to check on you and address any questions or concerns that you may have regarding the information given to you following your procedure. If we do not reach you, we will leave a message.  However, if you are feeling well and you are not experiencing any problems, there is no need to return our call.  We will assume that you have returned to your regular daily activities without incident.  If any biopsies were taken you will be contacted by phone or by letter within the next 1-3 weeks.  Please call us at [phone] if you have not heard about the biopsies in 3 weeks.    SIGNATURES/CONFIDENTIALITY: You and/or your care partner have signed paperwork which will be entered into your electronic medical record.  These signatures attest to the fact that that the information above on your After Visit Summary has been reviewed and is understood.  Full responsibility of the confidentiality of this discharge information lies with you and/or your care-partner.

## 2017-12-01 NOTE — Op Note (Signed)
[facility] Patient Name: [name] Procedure Date: 12/01/2017 10:00 AM MRN: [id number] Endoscopist: Wilhemina Bonito. Marina Goodell , MD Age: 52 Referring MD:  Date of Birth: 1965-08-03 Gender: Male Account #: [id number] Procedure:                Upper GI endoscopy Indications:              Epigastric abdominal pain, Esophageal reflux. Also                            history of duodenal bulb adenoma. Question                            previously resected. Needs follow-up Medicines:                Monitored Anesthesia Care Procedure:                Pre-Anesthesia Assessment:                           - Prior to the procedure, a History and Physical                            was performed, and patient medications and                            allergies were reviewed. The patient's tolerance of                            previous anesthesia was also reviewed. The risks                            and benefits of the procedure and the sedation                            options and risks were discussed with the patient.                            All questions were answered, and informed consent                            was obtained. Prior Anticoagulants: The patient has                            taken no previous anticoagulant or antiplatelet                            agents. ASA Grade Assessment: II - A patient with                            mild systemic disease. After reviewing the risks                            and benefits, the patient was deemed in  satisfactory condition to undergo the procedure.                           After obtaining informed consent, the endoscope was                            passed under direct vision. Throughout the                            procedure, the patient's blood pressure, pulse, and                            oxygen saturations were monitored continuously. The                            Endoscope was introduced  through the mouth, and                            advanced to the third part of duodenum. The upper                            GI endoscopy was accomplished without difficulty.                            The patient tolerated the procedure well. Scope In: Scope Out: Findings:                 The esophagus was normal.                           The stomach was normal save sliding hiatal hernia.                           The examined duodenum was normal.                           The cardia and gastric fundus were normal on                            retroflexion. Complications:            No immediate complications. Estimated Blood Loss:     Estimated blood loss: none. Impression:               - Normal esophagus.                           - Normal stomach.                           - Normal examined duodenum. No evidence for                            residual adenoma.                           - No specimens collected. Recommendation:           -  Patient has a contact number available for                            emergencies. The signs and symptoms of potential                            delayed complications were discussed with the                            patient. Return to normal activities tomorrow.                            Written discharge instructions were provided to the                            patient.                           - Resume previous diet.                           - Continue present medications.                           - Continue omeprazole 40 mg daily                           - STOP Smoking                           - Please schedule appointment with previsit nurse                            for "screening colonoscopy" [name] N. Marina Goodell, MD 12/01/2017 10:18:16 AM This report has been signed electronically.

## 2017-12-01 NOTE — Progress Notes (Signed)
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  He had left ankle fusion 09/2017.  Pt is currently in a boot, pt left boot on.  Pt ambulates with a cane - under stretcher.

## 2017-12-01 NOTE — Progress Notes (Signed)
Report to PACU, RN, vss, BBS= Clear.  

## 2017-12-02 ENCOUNTER — Telehealth: Payer: Self-pay

## 2017-12-02 NOTE — Telephone Encounter (Signed)
  Follow up Call-  Call back number 12/01/2017  Post procedure Call Back phone  # [phone] house  Permission to leave phone message Yes  Some recent data might be hidden     Patient questions:  Do you have a fever, pain , or abdominal swelling? No. Pain Score  0 *  Have you tolerated food without any problems? Yes.    Have you been able to return to your normal activities? Yes.    Do you have any questions about your discharge instructions: Diet   No. Medications  No. Follow up visit  No.  Do you have questions or concerns about your Care? No.  Actions: * If pain score is 4 or above: No action needed, pain <4.

## 2017-12-02 NOTE — Telephone Encounter (Signed)
Attempted to reach patient for post-procedure f/u call. No answer. Left message that we will make another attempt to reach him again later today and for him to please not hesitate to call us if he has any questions/concerns regarding his care. 

## 2017-12-07 ENCOUNTER — Other Ambulatory Visit: Payer: Self-pay | Admitting: Family Medicine

## 2017-12-07 DIAGNOSIS — I10 Essential (primary) hypertension: Secondary | ICD-10-CM

## 2017-12-17 ENCOUNTER — Ambulatory Visit (INDEPENDENT_AMBULATORY_CARE_PROVIDER_SITE_OTHER): Payer: Medicare Other | Admitting: Physician Assistant

## 2017-12-17 ENCOUNTER — Other Ambulatory Visit: Payer: Self-pay | Admitting: Family Medicine

## 2017-12-17 ENCOUNTER — Ambulatory Visit (INDEPENDENT_AMBULATORY_CARE_PROVIDER_SITE_OTHER): Payer: Medicare Other

## 2017-12-17 ENCOUNTER — Encounter (INDEPENDENT_AMBULATORY_CARE_PROVIDER_SITE_OTHER): Payer: Self-pay | Admitting: Physician Assistant

## 2017-12-17 VITALS — Ht 69.0 in | Wt 206.0 lb

## 2017-12-17 DIAGNOSIS — M25572 Pain in left ankle and joints of left foot: Secondary | ICD-10-CM

## 2017-12-17 DIAGNOSIS — Z981 Arthrodesis status: Secondary | ICD-10-CM

## 2017-12-17 DIAGNOSIS — M19172 Post-traumatic osteoarthritis, left ankle and foot: Secondary | ICD-10-CM

## 2017-12-17 DIAGNOSIS — J302 Other seasonal allergic rhinitis: Secondary | ICD-10-CM

## 2017-12-17 MED ORDER — OXYCODONE-ACETAMINOPHEN 5-325 MG PO TABS: 1.0000 | ORAL_TABLET | Freq: Three times a day (TID) | ORAL | 0 refills | Status: DC | PRN

## 2017-12-17 NOTE — Progress Notes (Signed)
Office Visit Note   Patient: [name]           Date of Birth: 01-28-1966           MRN: [id number] Visit Date: 12/17/2017              Requested by: Lovena Neighbours, MD [address] [place], [place] [address] PCP: Lovena Neighbours, MD  Chief Complaint  Patient presents with  . Left Ankle - Follow-up, Pain   Left ankle fusion 10/20/2017   HPI: Patient is a 52 year old gentleman who presents for follow-up following left ankle arthroscopic fusion on 10/20/2017.  He is now 2 months postop.  He reports that about a week ago he fell trying to help someone else get up and twisted on the left ankle and feels like he broke something.  He has been walking with his walker boot and his cane. He reports taking Percocet for pain, but is out of it and nothing he has tried is relieving  the pain.   Assessment & Plan: Visit Diagnoses:  1. Status post ankle fusion   2. Pain in left ankle and joints of left foot     Plan: Patient instructed to elevate the left leg as much as possible.. Continue to use boot for ambulation and resume either crutches or walker and partial weight bear on left leg in his walker boot for the next 3 weeks. follow up in 3 weeks.   Follow-Up Instructions: Return in about 3 weeks (around 01/07/2018).   Ortho Exam  Patient is alert, oriented, no adenopathy, well-dressed, normal affect, normal respiratory effort. The left ankle has mild edema, no signs of cellulitis, incisions healed. No wounds or other signs for infection. Foot is plantigrade.  Imaging: Xr Ankle 2 Views Left  Result Date: 12/17/2017 Left ankle x rays show stable left ankle fusion without evidence of fracture or other acute findings.   No images are attached to the encounter.  Labs: Lab Results  Component Value Date   HGBA1C 5.6 11/05/2016   HGBA1C 6.0 (H) 07/10/2016   HGBA1C 5.8 04/10/2016   ESRSEDRATE 9 11/05/2016   ESRSEDRATE 7 10/26/2016   ESRSEDRATE 16 08/12/2016   CRP 21.9 (H)  11/05/2016   CRP 82.8 (H) 10/26/2016   LABURIC 5.2 04/17/2014   REPTSTATUS 07/12/2016 FINAL 07/10/2016   GRAMSTAIN Rare 10/29/2016   GRAMSTAIN WBC present-both PMN and Mononuclear 10/29/2016   GRAMSTAIN No Organisms Seen 10/29/2016   CULT 70,000 COLONIES/mL CITROBACTER KOSERI (A) 07/10/2016   LABORGA CITROBACTER KOSERI (A) 07/10/2016     Lab Results  Component Value Date   ALBUMIN 4.3 06/29/2017   ALBUMIN 4.1 04/28/2017   ALBUMIN 3.8 11/05/2016   LABURIC 5.2 04/17/2014    Body mass index is 30.42 kg/m.  Orders:  Orders Placed This Encounter  Procedures  . XR Ankle 2 Views Left   Meds ordered this encounter  Medications  . oxyCODONE-acetaminophen (PERCOCET/ROXICET) 5-325 MG tablet    Sig: Take 1 tablet by mouth every 8 (eight) hours as needed for moderate pain or severe pain.    Dispense:  21 tablet    Refill:  0     Procedures: No procedures performed  Clinical Data: No additional findings.  ROS:  All other systems negative, except as noted in the HPI. Review of Systems  Objective: Vital Signs: Ht 5\' 9"  (1.753 m)   Wt 206 lb (93.4 kg)   BMI 30.42 kg/m   Specialty Comments:  No specialty comments  available.  PMFS History: Patient Active Problem List   Diagnosis Date Noted  . S/P ankle fusion 10/20/2017  . Traumatic osteoarthritis of left ankle   . Screening for colon cancer 06/29/2017  . Seasonal allergies 06/29/2017  . Acute bacterial sinusitis 05/01/2017  . Tachycardia 05/01/2017  . Post-traumatic osteoarthritis, left ankle and foot 01/07/2017  . Presence of right artificial knee joint 12/17/2016  . Pain in left ankle and joints of left foot 12/17/2016  . At risk for adverse drug event 07/28/2016  . Primary osteoarthritis of right knee 05/27/2016  . Upper GI bleed 02/18/2015  . Benign essential HTN 02/18/2015  . Anemia of chronic disease 02/18/2015  . Pulmonary nodule 02/18/2015  . Alcohol dependence with intoxication with complication (HCC)    . Major depressive disorder, recurrent episode, moderate (HCC)   . Cocaine abuse (HCC) 03/02/2014  . History of schizophrenia 03/01/2014  . Tobacco abuse 12/06/2013  . Hematemesis 12/04/2013   Past Medical History:  Diagnosis Date  . Anxiety   . Arthritis   . Asthma   . Duodenal adenoma   . GERD (gastroesophageal reflux disease)   . Hypertension   . Pneumonia    hx  . Post-traumatic osteoarthritis of left ankle   . Pre-diabetes   . Schizophrenia (HCC)   . Stroke [facility])    2009-or10    Family History  Adopted: Yes  Problem Relation Age of Onset  . Colon cancer Neg Hx   . Esophageal cancer Neg Hx   . Rectal cancer Neg Hx   . Stomach cancer Neg Hx     Past Surgical History:  Procedure Laterality Date  . ANKLE ARTHROSCOPY Left 10/20/2017   Procedure: ANKLE ARTHROSCOPY;  Surgeon: Nadara Mustard, MD;  Location: [facility] OR;  Service: Orthopedics;  Laterality: Left;  . ANKLE FUSION Left 10/20/2017  . ANKLE FUSION Left 10/20/2017   Procedure: LEFT ANKLE FUSION;  Surgeon: Nadara Mustard, MD;  Location: [facility] OR;  Service: Orthopedics;  Laterality: Left;  . ESOPHAGOGASTRODUODENOSCOPY Left 12/05/2013   Procedure: ESOPHAGOGASTRODUODENOSCOPY (EGD);  Surgeon: Willis Modena, MD;  Location: [facility];  Service: Endoscopy;  Laterality: Left;  . ESOPHAGOGASTRODUODENOSCOPY (EGD) WITH PROPOFOL N/A 02/20/2015   Procedure: ESOPHAGOGASTRODUODENOSCOPY (EGD) WITH PROPOFOL;  Surgeon: Charlott Rakes, MD;  Location: [facility];  Service: Endoscopy;  Laterality: N/A;  . TOTAL KNEE ARTHROPLASTY Right 07/21/2016   Procedure: TOTAL KNEE ARTHROPLASTY;  Surgeon: Cammy Copa, MD;  Location: [facility] OR;  Service: Orthopedics;  Laterality: Right;  . UPPER GASTROINTESTINAL ENDOSCOPY     Social History   Occupational History  . Not on file  Tobacco Use  . Smoking status: Heavy Tobacco Smoker    Packs/day: 0.50    Years: 13.00    Pack years: 6.50    Types: Cigarettes  . Smokeless tobacco: Never Used  .  Tobacco comment: onset age 69; upto 1.5 ppd  Substance and Sexual Activity  . Alcohol use: No    Alcohol/week: 0.0 standard drinks    Comment: quit 2015; formerly up to 2 fifths/day  . Drug use: No  . Sexual activity: Yes

## 2017-12-21 ENCOUNTER — Ambulatory Visit (INDEPENDENT_AMBULATORY_CARE_PROVIDER_SITE_OTHER): Payer: Medicare Other | Admitting: Orthopedic Surgery

## 2017-12-30 ENCOUNTER — Ambulatory Visit (INDEPENDENT_AMBULATORY_CARE_PROVIDER_SITE_OTHER): Payer: Medicare Other | Admitting: Orthopedic Surgery

## 2017-12-30 ENCOUNTER — Encounter (INDEPENDENT_AMBULATORY_CARE_PROVIDER_SITE_OTHER): Payer: Self-pay | Admitting: Physician Assistant

## 2017-12-30 ENCOUNTER — Ambulatory Visit (INDEPENDENT_AMBULATORY_CARE_PROVIDER_SITE_OTHER): Payer: Medicare Other

## 2017-12-30 VITALS — Ht 69.0 in | Wt 206.0 lb

## 2017-12-30 DIAGNOSIS — G8929 Other chronic pain: Secondary | ICD-10-CM | POA: Diagnosis not present

## 2017-12-30 DIAGNOSIS — M545 Low back pain: Secondary | ICD-10-CM | POA: Diagnosis not present

## 2017-12-30 DIAGNOSIS — M4316 Spondylolisthesis, lumbar region: Secondary | ICD-10-CM | POA: Diagnosis not present

## 2017-12-30 MED ORDER — PREDNISONE 10 MG PO TABS: 20.0000 mg | ORAL_TABLET | Freq: Every day | ORAL | 0 refills | Status: DC

## 2017-12-30 NOTE — Progress Notes (Signed)
Office Visit Note   Patient: [name]           Date of Birth: [birth year]           MRN: [id number] Visit Date: 12/30/2017              Requested by: Lovena Neighbours, MD [address] [place], [place] [address] PCP: Lovena Neighbours, MD  Chief Complaint  Patient presents with  . Left Ankle - Routine Post Op    10/20/17 left ankle fusion   . Lower Back - Pain      HPI: Patient is a 52 year old gentleman who presents complaining of lumbar spine pain radiating to the buttocks bilaterally.  Patient states he still has pain from the ankle fusion.  Patient states that his whole as hurts.  Denies any groin pain.  Assessment & Plan: Visit Diagnoses:  1. Chronic bilateral low back pain without sciatica   2. Spondylolisthesis, lumbar region     Plan: Patient was called in a prescription for prednisone discussed weaning off the prednisone as his symptoms improved reevaluate in 4 weeks.  Follow-Up Instructions: Return in about 4 weeks (around 01/27/2018).   Ortho Exam  Patient is alert, oriented, no adenopathy, well-dressed, normal affect, normal respiratory effort. Examination patient does have an antalgic gait uses a cane in the right hand fracture boot on the left foot.  Patient has no pain with range of motion of hips or knees.  Negative straight leg raise bilaterally.  No focal motor weakness in either lower extremity.  Patient's pain is primarily lumbar spine radiating to the buttocks bilaterally.  Negative sciatic tension sign.  Imaging: Xr Lumbar Spine 2-3 Views  Result Date: 12/30/2017 2 view radiographs of the lumbar spine shows a grade 1 spondylolisthesis at L4-5.  No compression fractures.  No images are attached to the encounter.  Labs: Lab Results  Component Value Date   HGBA1C 5.6 11/05/2016   HGBA1C 6.0 (H) 07/10/2016   HGBA1C 5.8 04/10/2016   ESRSEDRATE 9 11/05/2016   ESRSEDRATE 7 10/26/2016   ESRSEDRATE 16 08/12/2016   CRP 21.9 (H) 11/05/2016   CRP  82.8 (H) 10/26/2016   LABURIC 5.2 04/17/2014   REPTSTATUS 07/12/2016 FINAL 07/10/2016   GRAMSTAIN Rare 10/29/2016   GRAMSTAIN WBC present-both PMN and Mononuclear 10/29/2016   GRAMSTAIN No Organisms Seen 10/29/2016   CULT 70,000 COLONIES/mL CITROBACTER KOSERI (A) 07/10/2016   LABORGA CITROBACTER KOSERI (A) 07/10/2016     Lab Results  Component Value Date   ALBUMIN 4.3 06/29/2017   ALBUMIN 4.1 04/28/2017   ALBUMIN 3.8 11/05/2016   LABURIC 5.2 04/17/2014    Body mass index is 30.42 kg/m.  Orders:  Orders Placed This Encounter  Procedures  . XR Lumbar Spine 2-3 Views   Meds ordered this encounter  Medications  . predniSONE (DELTASONE) 10 MG tablet    Sig: Take 2 tablets (20 mg total) by mouth daily with breakfast.    Dispense:  60 tablet    Refill:  0     Procedures: No procedures performed  Clinical Data: No additional findings.  ROS:  All other systems negative, except as noted in the HPI. Review of Systems  Objective: Vital Signs: Ht 5\' 9"  (1.753 m)   Wt 206 lb (93.4 kg)   BMI 30.42 kg/m   Specialty Comments:  No specialty comments available.  PMFS History: Patient Active Problem List   Diagnosis Date Noted  . Spondylolisthesis, lumbar region 12/30/2017  . S/P ankle  fusion 10/20/2017  . Traumatic osteoarthritis of left ankle   . Screening for colon cancer 06/29/2017  . Seasonal allergies 06/29/2017  . Acute bacterial sinusitis 05/01/2017  . Tachycardia 05/01/2017  . Post-traumatic osteoarthritis, left ankle and foot 01/07/2017  . Presence of right artificial knee joint 12/17/2016  . Pain in left ankle and joints of left foot 12/17/2016  . At risk for adverse drug event 07/28/2016  . Primary osteoarthritis of right knee 05/27/2016  . Upper GI bleed 02/18/2015  . Benign essential HTN 02/18/2015  . Anemia of chronic disease 02/18/2015  . Pulmonary nodule 02/18/2015  . Alcohol dependence with intoxication with complication (HCC)   . Major  depressive disorder, recurrent episode, moderate (HCC)   . Cocaine abuse (HCC) 03/02/2014  . History of schizophrenia 03/01/2014  . Tobacco abuse 12/06/2013  . Hematemesis 12/04/2013   Past Medical History:  Diagnosis Date  . Anxiety   . Arthritis   . Asthma   . Duodenal adenoma   . GERD (gastroesophageal reflux disease)   . Hypertension   . Pneumonia    hx  . Post-traumatic osteoarthritis of left ankle   . Pre-diabetes   . Schizophrenia (HCC)   . Stroke [facility])    2009-or10    Family History  Adopted: Yes  Problem Relation Age of Onset  . Colon cancer Neg Hx   . Esophageal cancer Neg Hx   . Rectal cancer Neg Hx   . Stomach cancer Neg Hx     Past Surgical History:  Procedure Laterality Date  . ANKLE ARTHROSCOPY Left 10/20/2017   Procedure: ANKLE ARTHROSCOPY;  Surgeon: Nadara Mustard, MD;  Location: [facility] OR;  Service: Orthopedics;  Laterality: Left;  . ANKLE FUSION Left 10/20/2017  . ANKLE FUSION Left 10/20/2017   Procedure: LEFT ANKLE FUSION;  Surgeon: Nadara Mustard, MD;  Location: [facility] OR;  Service: Orthopedics;  Laterality: Left;  . ESOPHAGOGASTRODUODENOSCOPY Left 12/05/2013   Procedure: ESOPHAGOGASTRODUODENOSCOPY (EGD);  Surgeon: Willis Modena, MD;  Location: [facility];  Service: Endoscopy;  Laterality: Left;  . ESOPHAGOGASTRODUODENOSCOPY (EGD) WITH PROPOFOL N/A 02/20/2015   Procedure: ESOPHAGOGASTRODUODENOSCOPY (EGD) WITH PROPOFOL;  Surgeon: Charlott Rakes, MD;  Location: [facility];  Service: Endoscopy;  Laterality: N/A;  . TOTAL KNEE ARTHROPLASTY Right 07/21/2016   Procedure: TOTAL KNEE ARTHROPLASTY;  Surgeon: Cammy Copa, MD;  Location: [facility] OR;  Service: Orthopedics;  Laterality: Right;  . UPPER GASTROINTESTINAL ENDOSCOPY     Social History   Occupational History  . Not on file  Tobacco Use  . Smoking status: Heavy Tobacco Smoker    Packs/day: 0.50    Years: 13.00    Pack years: 6.50    Types: Cigarettes  . Smokeless tobacco: Never Used  . Tobacco  comment: onset age 61; upto 1.5 ppd  Substance and Sexual Activity  . Alcohol use: No    Alcohol/week: 0.0 standard drinks    Comment: quit 2015; formerly up to 2 fifths/day  . Drug use: No  . Sexual activity: Yes

## 2017-12-31 ENCOUNTER — Other Ambulatory Visit: Payer: Self-pay | Admitting: Family Medicine

## 2018-01-06 ENCOUNTER — Telehealth (INDEPENDENT_AMBULATORY_CARE_PROVIDER_SITE_OTHER): Payer: Self-pay

## 2018-01-06 NOTE — Telephone Encounter (Signed)
Patient would like a Rx for pain.  Stated that he is still having left ankle pain and low back pain.  Cb# is [phone] or [phone].  Please advise.

## 2018-01-07 ENCOUNTER — Other Ambulatory Visit (INDEPENDENT_AMBULATORY_CARE_PROVIDER_SITE_OTHER): Payer: Self-pay | Admitting: Physician Assistant

## 2018-01-07 MED ORDER — OXYCODONE-ACETAMINOPHEN 5-325 MG PO TABS: 1.0000 | ORAL_TABLET | Freq: Three times a day (TID) | ORAL | 0 refills | Status: DC | PRN

## 2018-01-07 NOTE — Telephone Encounter (Signed)
Prescription for Percocet left for patient to pick up.

## 2018-01-07 NOTE — Telephone Encounter (Signed)
S/p left ankle fusion 10/20/17 requesting refill for percocet 5/325. Last refill 12/17/17 #21

## 2018-01-07 NOTE — Telephone Encounter (Signed)
I called and lm on vm to advise rx for pick up at the front desk

## 2018-01-10 ENCOUNTER — Ambulatory Visit (INDEPENDENT_AMBULATORY_CARE_PROVIDER_SITE_OTHER): Payer: Medicare Other | Admitting: Orthopedic Surgery

## 2018-01-12 ENCOUNTER — Ambulatory Visit (AMBULATORY_SURGERY_CENTER): Payer: Self-pay | Admitting: *Deleted

## 2018-01-12 VITALS — Ht 69.0 in | Wt 206.0 lb

## 2018-01-12 DIAGNOSIS — Z1211 Encounter for screening for malignant neoplasm of colon: Secondary | ICD-10-CM

## 2018-01-12 MED ORDER — NA SULFATE-K SULFATE-MG SULF 17.5-3.13-1.6 GM/177ML PO SOLN: ORAL | 0 refills | Status: DC

## 2018-01-12 NOTE — Progress Notes (Signed)
Patient denies any allergies to eggs or soy. Patient denies any problems with anesthesia/sedation. Patient denies any oxygen use at home. Patient denies taking any diet/weight loss medications or blood thinners. EMMI education assisgned to patient on colonoscopy, this was explained and instructions given to patient. 

## 2018-01-19 ENCOUNTER — Telehealth (INDEPENDENT_AMBULATORY_CARE_PROVIDER_SITE_OTHER): Payer: Self-pay | Admitting: Orthopedic Surgery

## 2018-01-19 NOTE — Telephone Encounter (Signed)
Pt is s/p a left ankle fusion 09/2017. I called the pt to advise he needs to come into the office for xrays to make sure the fusion is ok. Pt declined appt today or any day this week. He states that he could use some pain medication and to just " ride this think out" I advised that if he is having the pain and swelling that he described following a "pop" after a fall that we need to see him and make sue that there is no injury to the surgical site. Pt again declined. I advised that if he was in this much distress he should go to the ER. Pt said that he will just wait until his appt on 01/27/18. I advised that this is not what I would suggest to do. He said that he may go to the ER I advised pt to cal and let me know what it is that he would like to do.

## 2018-01-19 NOTE — Telephone Encounter (Signed)
Patient said he spoke with the oncall doctor last night and was told to call here today. He said he fell and his leg is in a lot of pain and had a lot of swelling to the size of a grapefruit. He was told to ice and elevate but wants to know what Dr. Sharol Given expects him to do, he cannot make a sooner appt because he comes on transportation and has to leave a 3 day notice. He is currently scheduled for 10/31. CB # [id number]

## 2018-01-24 ENCOUNTER — Telehealth: Payer: Self-pay | Admitting: Internal Medicine

## 2018-01-26 ENCOUNTER — Telehealth (INDEPENDENT_AMBULATORY_CARE_PROVIDER_SITE_OTHER): Payer: Self-pay

## 2018-01-26 ENCOUNTER — Encounter: Payer: Self-pay | Admitting: Internal Medicine

## 2018-01-26 NOTE — Telephone Encounter (Signed)
Patient's fiance  [name] called stating that patient has a boil on the left side of his ankle that needs to be drained and the back of his left ankle is swollen.  Talked with Dr. Jess Barters assistant Autumn and advised her of message above and she stated that patient should come into the office.  Hewitt Blade that patient needed to come into the office to be evaluated per Autumn, but she stated that they have no transportation.  [name] stated that patient has an appointment on Thursday, 01/27/2018. Hewitt Blade that patient should elevate, ice, and take Tylenol or Ibuprofen for ankle pain, per Erlinda Hong, PA to Dr. Sharol Given.

## 2018-01-27 ENCOUNTER — Encounter (INDEPENDENT_AMBULATORY_CARE_PROVIDER_SITE_OTHER): Payer: Self-pay | Admitting: Orthopedic Surgery

## 2018-01-27 ENCOUNTER — Other Ambulatory Visit (INDEPENDENT_AMBULATORY_CARE_PROVIDER_SITE_OTHER): Payer: Self-pay

## 2018-01-27 ENCOUNTER — Ambulatory Visit (INDEPENDENT_AMBULATORY_CARE_PROVIDER_SITE_OTHER): Payer: Medicare Other | Admitting: Orthopedic Surgery

## 2018-01-27 ENCOUNTER — Ambulatory Visit (INDEPENDENT_AMBULATORY_CARE_PROVIDER_SITE_OTHER): Payer: Self-pay

## 2018-01-27 DIAGNOSIS — M25572 Pain in left ankle and joints of left foot: Secondary | ICD-10-CM

## 2018-01-27 DIAGNOSIS — Z981 Arthrodesis status: Secondary | ICD-10-CM | POA: Diagnosis not present

## 2018-01-27 DIAGNOSIS — T847XXA Infection and inflammatory reaction due to other internal orthopedic prosthetic devices, implants and grafts, initial encounter: Secondary | ICD-10-CM

## 2018-01-28 ENCOUNTER — Encounter (HOSPITAL_COMMUNITY): Payer: Self-pay

## 2018-01-28 ENCOUNTER — Inpatient Hospital Stay (HOSPITAL_COMMUNITY)
Admission: EM | Admit: 2018-01-28 | Discharge: 2018-01-31 | DRG: 494 | Disposition: A | Discharge status: Home Health | Payer: Medicare Other | Attending: Internal Medicine | Admitting: Internal Medicine

## 2018-01-28 ENCOUNTER — Emergency Department (HOSPITAL_COMMUNITY): Payer: Medicare Other

## 2018-01-28 ENCOUNTER — Telehealth (INDEPENDENT_AMBULATORY_CARE_PROVIDER_SITE_OTHER): Payer: Self-pay

## 2018-01-28 ENCOUNTER — Other Ambulatory Visit: Payer: Self-pay

## 2018-01-28 DIAGNOSIS — M009 Pyogenic arthritis, unspecified: Secondary | ICD-10-CM | POA: Diagnosis not present

## 2018-01-28 DIAGNOSIS — T148XXA Other injury of unspecified body region, initial encounter: Secondary | ICD-10-CM

## 2018-01-28 DIAGNOSIS — Z981 Arthrodesis status: Secondary | ICD-10-CM | POA: Diagnosis not present

## 2018-01-28 DIAGNOSIS — F17210 Nicotine dependence, cigarettes, uncomplicated: Secondary | ICD-10-CM | POA: Diagnosis not present

## 2018-01-28 DIAGNOSIS — T8149XA Infection following a procedure, other surgical site, initial encounter: Secondary | ICD-10-CM | POA: Diagnosis present

## 2018-01-28 DIAGNOSIS — Z96651 Presence of right artificial knee joint: Secondary | ICD-10-CM | POA: Diagnosis not present

## 2018-01-28 DIAGNOSIS — L089 Local infection of the skin and subcutaneous tissue, unspecified: Secondary | ICD-10-CM

## 2018-01-28 DIAGNOSIS — Z8673 Personal history of transient ischemic attack (TIA), and cerebral infarction without residual deficits: Secondary | ICD-10-CM | POA: Diagnosis not present

## 2018-01-28 DIAGNOSIS — K219 Gastro-esophageal reflux disease without esophagitis: Secondary | ICD-10-CM | POA: Diagnosis not present

## 2018-01-28 DIAGNOSIS — T8459XA Infection and inflammatory reaction due to other internal joint prosthesis, initial encounter: Principal | ICD-10-CM | POA: Diagnosis present

## 2018-01-28 DIAGNOSIS — B952 Enterococcus as the cause of diseases classified elsewhere: Secondary | ICD-10-CM | POA: Diagnosis not present

## 2018-01-28 DIAGNOSIS — M19172 Post-traumatic osteoarthritis, left ankle and foot: Secondary | ICD-10-CM | POA: Diagnosis present

## 2018-01-28 DIAGNOSIS — F209 Schizophrenia, unspecified: Secondary | ICD-10-CM | POA: Diagnosis present

## 2018-01-28 DIAGNOSIS — Z720 Tobacco use: Secondary | ICD-10-CM | POA: Diagnosis present

## 2018-01-28 DIAGNOSIS — M86272 Subacute osteomyelitis, left ankle and foot: Secondary | ICD-10-CM | POA: Diagnosis not present

## 2018-01-28 DIAGNOSIS — T1490XS Injury, unspecified, sequela: Secondary | ICD-10-CM | POA: Diagnosis not present

## 2018-01-28 DIAGNOSIS — I10 Essential (primary) hypertension: Secondary | ICD-10-CM | POA: Diagnosis present

## 2018-01-28 DIAGNOSIS — T8149XD Infection following a procedure, other surgical site, subsequent encounter: Secondary | ICD-10-CM | POA: Diagnosis not present

## 2018-01-28 DIAGNOSIS — Z1624 Resistance to multiple antibiotics: Secondary | ICD-10-CM | POA: Diagnosis not present

## 2018-01-28 LAB — SEDIMENTATION RATE: SED RATE: 94 mm/h — AB (ref 0–16)

## 2018-01-28 LAB — COMPREHENSIVE METABOLIC PANEL
ALBUMIN: 3.4 g/dL — AB (ref 3.5–5.0)
ALK PHOS: 99 U/L (ref 38–126)
ALT: 8 U/L (ref 0–44)
ANION GAP: 11 (ref 5–15)
AST: 18 U/L (ref 15–41)
BUN: 8 mg/dL (ref 6–20)
CO2: 23 mmol/L (ref 22–32)
Calcium: 8.8 mg/dL — ABNORMAL LOW (ref 8.9–10.3)
Chloride: 103 mmol/L (ref 98–111)
Creatinine, Ser: 0.97 mg/dL (ref 0.61–1.24)
GFR calc non Af Amer: >60 mL/min (ref >60)
Glucose, Bld: 99 mg/dL (ref 70–99)
Potassium: 3.8 mmol/L (ref 3.5–5.1)
SODIUM: 137 mmol/L (ref 135–145)
Total Bilirubin: 0.6 mg/dL (ref 0.3–1.2)
Total Protein: 7.3 g/dL (ref 6.5–8.1)

## 2018-01-28 LAB — CBC WITH DIFFERENTIAL/PLATELET
ABS IMMATURE GRANULOCYTES: 0.02 10*3/uL (ref 0.00–0.07)
BASOS ABS: 0 10*3/uL (ref 0.0–0.1)
Basophils Relative: 0 %
Eosinophils Absolute: 0.1 10*3/uL (ref 0.0–0.5)
Eosinophils Relative: 1 %
HEMATOCRIT: 38.3 % — AB (ref 39.0–52.0)
HEMOGLOBIN: 11.4 g/dL — AB (ref 13.0–17.0)
Immature Granulocytes: 0 %
LYMPHS ABS: 1.1 10*3/uL (ref 0.7–4.0)
LYMPHS PCT: 13 %
MCH: 25.3 pg — ABNORMAL LOW (ref 26.0–34.0)
MCHC: 29.8 g/dL — ABNORMAL LOW (ref 30.0–36.0)
MCV: 85.1 fL (ref 80.0–100.0)
MONOS PCT: 6 %
Monocytes Absolute: 0.5 10*3/uL (ref 0.1–1.0)
NEUTROS PCT: 80 %
NRBC: 0 % (ref 0.0–0.2)
Neutro Abs: 6.7 10*3/uL (ref 1.7–7.7)
Platelets: 529 10*3/uL — ABNORMAL HIGH (ref 150–400)
RBC: 4.5 MIL/uL (ref 4.22–5.81)
RDW: 14.9 % (ref 11.5–15.5)
WBC: 8.4 10*3/uL (ref 4.0–10.5)

## 2018-01-28 LAB — I-STAT CG4 LACTIC ACID, ED: Lactic Acid, Venous: 0.68 mmol/L (ref 0.5–1.9)

## 2018-01-28 LAB — URINALYSIS, ROUTINE W REFLEX MICROSCOPIC
Bilirubin Urine: NEGATIVE
Glucose, UA: NEGATIVE mg/dL
Hgb urine dipstick: NEGATIVE
Ketones, ur: 20 mg/dL — AB
LEUKOCYTES UA: NEGATIVE
NITRITE: NEGATIVE
PROTEIN: NEGATIVE mg/dL
SPECIFIC GRAVITY, URINE: 1.008 (ref 1.005–1.030)
pH: 7 (ref 5.0–8.0)

## 2018-01-28 LAB — C-REACTIVE PROTEIN: CRP: 12.6 mg/dL — AB (ref <1.0)

## 2018-01-28 MED ORDER — LOSARTAN POTASSIUM 50 MG PO TABS
50.0000 mg | ORAL_TABLET | Freq: Every day | ORAL | Status: DC
  Administered 2018-01-30 – 2018-01-31 (×2): 50 mg via ORAL
  Filled 2018-01-28 (×3): qty 1

## 2018-01-28 MED ORDER — AMLODIPINE BESYLATE 2.5 MG PO TABS
2.5000 mg | ORAL_TABLET | Freq: Every day | ORAL | Status: DC
  Administered 2018-01-28 – 2018-01-31 (×3): 2.5 mg via ORAL
  Filled 2018-01-28 (×3): qty 1

## 2018-01-28 MED ORDER — PIPERACILLIN-TAZOBACTAM 3.375 G IVPB 30 MIN: 3.3750 g | Freq: Once | INTRAVENOUS | Status: DC

## 2018-01-28 MED ORDER — ACETAMINOPHEN 325 MG PO TABS
650.0000 mg | ORAL_TABLET | Freq: Four times a day (QID) | ORAL | Status: DC | PRN
  Administered 2018-01-28: 650 mg via ORAL
  Filled 2018-01-28 (×2): qty 2

## 2018-01-28 MED ORDER — PANTOPRAZOLE SODIUM 40 MG PO TBEC
80.0000 mg | DELAYED_RELEASE_TABLET | Freq: Every day | ORAL | Status: DC
  Administered 2018-01-29 – 2018-01-31 (×3): 80 mg via ORAL
  Filled 2018-01-28 (×4): qty 2

## 2018-01-28 MED ORDER — VANCOMYCIN HCL IN DEXTROSE 1-5 GM/200ML-% IV SOLN: 1000.0000 mg | Freq: Once | INTRAVENOUS | Status: DC

## 2018-01-28 MED ORDER — KETOROLAC TROMETHAMINE 15 MG/ML IJ SOLN
15.0000 mg | Freq: Four times a day (QID) | INTRAMUSCULAR | Status: DC | PRN
  Administered 2018-01-28 – 2018-01-31 (×6): 15 mg via INTRAVENOUS
  Filled 2018-01-28 (×6): qty 1

## 2018-01-28 MED ORDER — MIRTAZAPINE 15 MG PO TABS
15.0000 mg | ORAL_TABLET | Freq: Every day | ORAL | Status: DC
  Administered 2018-01-28 – 2018-01-30 (×3): 15 mg via ORAL
  Filled 2018-01-28 (×3): qty 1

## 2018-01-28 MED ORDER — ALBUTEROL SULFATE (2.5 MG/3ML) 0.083% IN NEBU: 2.5000 mg | INHALATION_SOLUTION | Freq: Two times a day (BID) | RESPIRATORY_TRACT | Status: DC | PRN

## 2018-01-28 MED ORDER — MORPHINE SULFATE (PF) 4 MG/ML IV SOLN
4.0000 mg | Freq: Once | INTRAVENOUS | Status: AC
  Administered 2018-01-28: 4 mg via INTRAVENOUS
  Filled 2018-01-28: qty 1

## 2018-01-28 MED ORDER — ALBUTEROL SULFATE HFA 108 (90 BASE) MCG/ACT IN AERS: 1.0000 | INHALATION_SPRAY | Freq: Two times a day (BID) | RESPIRATORY_TRACT | Status: DC | PRN

## 2018-01-28 MED ORDER — SUCRALFATE 1 G PO TABS
1.0000 g | ORAL_TABLET | Freq: Three times a day (TID) | ORAL | Status: DC
  Administered 2018-01-28 – 2018-01-31 (×10): 1 g via ORAL
  Filled 2018-01-28 (×10): qty 1

## 2018-01-28 MED ORDER — BUPROPION HCL ER (XL) 150 MG PO TB24
150.0000 mg | ORAL_TABLET | Freq: Every day | ORAL | Status: DC
  Administered 2018-01-28 – 2018-01-31 (×4): 150 mg via ORAL
  Filled 2018-01-28 (×4): qty 1

## 2018-01-28 MED ORDER — ACETAMINOPHEN 650 MG RE SUPP: 650.0000 mg | Freq: Four times a day (QID) | RECTAL | Status: DC | PRN

## 2018-01-28 NOTE — H&P (Signed)
History and Physical    [name] JXB:[id number] DOB: [birth year] DOA: 01/28/2018  PCP: Lovena Neighbours, MD  Patient coming from: Home  I have personally briefly reviewed patient's old medical records in [facility]  Chief Complaint: Left ankle swelling  HPI: [name] is a 52 y.o. male with medical history significant for hypertension, asthma, CVA, schizophrenia, and posttraumatic arthritis of his left ankle with chronic pain who presented to ED with swelling of his left ankle.  Underwent hardware fixation and fusion on his left ankle on 10/20/2017 by Dr. Lajoyce Corners.  Patient noticed about 3 days ago and increased swelling of his left ankle compared to his right.  It associated pain and fevers.  He was unable to ambulate as well as usual.  He then noticed a boil on his left lateral ankle which at discharge.  His fiance called Dr. Audrie Lia office on 01/26/2018 to notify of the new findings and was directed to come to the emergency department.  In addition to fevers, he has had chills and occasional diaphoresis.  He denies any chest pain, dyspnea, abdominal pain, dysuria.  ED Course:  Initial BP 147/103, pulse 115, RR 14, temp 98.21F, SPO2 99% on room air. WBC 8.4, hemoglobin 11.4, platelets 529. Lactic acid 0.68.  Urinalysis without evidence of UTI. Infectious disease were consulted and recommended holding off on antibiotics for now as patient is hemodynamically stable.  Review of Systems: As per HPI otherwise 10 point review of systems negative.    Past Medical History:  Diagnosis Date  . Anxiety   . Arthritis   . Asthma   . Duodenal adenoma   . GERD (gastroesophageal reflux disease)   . Hypertension   . Pneumonia    hx  . Post-traumatic osteoarthritis of left ankle   . Pre-diabetes   . Schizophrenia (HCC)   . Stroke [facility]    2009-or10    Past Surgical History:  Procedure Laterality Date  . ANKLE ARTHROSCOPY Left 10/20/2017   Procedure: ANKLE ARTHROSCOPY;  Surgeon:  Nadara Mustard, MD;  Location: [facility] OR;  Service: Orthopedics;  Laterality: Left;  . ANKLE FUSION Left 10/20/2017  . ANKLE FUSION Left 10/20/2017   Procedure: LEFT ANKLE FUSION;  Surgeon: Nadara Mustard, MD;  Location: [facility] OR;  Service: Orthopedics;  Laterality: Left;  . ESOPHAGOGASTRODUODENOSCOPY Left 12/05/2013   Procedure: ESOPHAGOGASTRODUODENOSCOPY (EGD);  Surgeon: Willis Modena, MD;  Location: [facility] END[facility]COPY;  Service: Endoscopy;  Laterality: Left;  . ESOPHAGOGASTRODUODENOSCOPY (EGD) WITH PROPOFOL N/A 02/20/2015   Procedure: ESOPHAGOGASTRODUODENOSCOPY (EGD) WITH PROPOFOL;  Surgeon: Charlott Rakes, MD;  Location: [facility] ENDOSCOPY;  Service: Endoscopy;  Laterality: N/A;  . TOTAL KNEE ARTHROPLASTY Right 07/21/2016   Procedure: TOTAL KNEE ARTHROPLASTY;  Surgeon: Cammy Copa, MD;  Location: [facility] OR;  Service: Orthopedics;  Laterality: Right;  . UPPER GASTROINTESTINAL ENDOSCOPY       reports that he has been smoking cigarettes. He has a 57.00 pack-year smoking history. He has never used smokeless tobacco. He reports that he drank about 3.0 standard drinks of alcohol per week. He reports that he does not use drugs.  No Known Allergies  Family History  Adopted: Yes  Problem Relation Age of Onset  . Colon cancer Neg Hx   . Esophageal cancer Neg Hx   . Rectal cancer Neg Hx   . Stomach cancer Neg Hx      Prior to Admission medications   Medication Sig Start Date End Date Taking? Authorizing Provider  albuterol (PROVENTIL HFA;VENTOLIN  HFA) 108 (90 Base) MCG/ACT inhaler Inhale 1 puff into the lungs 2 (two) times daily as needed for wheezing or shortness of breath. 05/14/17   Diallo, Lilia Argue, MD  amLODipine (NORVASC) 2.5 MG tablet TAKE 1 TABLET BY MOUTH ONCE DAILY. 12/07/17   Diallo, Lilia Argue, MD  benztropine (COGENTIN) 1 MG tablet Take 1 mg by mouth daily.    [provider]  buPROPion (WELLBUTRIN XL) 150 MG 24 hr tablet TAKE 1 TABLET(150 MG) BY MOUTH DAILY Patient taking differently:  Take 150 mg by mouth daily.  05/14/17   Diallo, Lilia Argue, MD  cetirizine (ZYRTEC) 10 MG tablet Take 1 tablet (10 mg total) by mouth daily. 05/14/17   Diallo, Lilia Argue, MD  fluticasone (FLONASE) 50 MCG/ACT nasal spray SHAKE LIQUID AND USE 1 SPRAY IN EACH NOSTRIL DAILY 12/22/17   Diallo, Abdoulaye, MD  ibuprofen (ADVIL,MOTRIN) 600 MG tablet TAKE 1 TABLET(600 MG) BY MOUTH EVERY 6 HOURS AS NEEDED FOR MILD PAIN OR MODERATE PAIN 12/22/17   Diallo, Lilia Argue, MD  INVEGA TRINZA 819 MG/2.625ML SUSY Inject 819 mg into the skin every 3 (three) months. 09/08/17   [provider]  losartan (COZAAR) 50 MG tablet TAKE 1 TABLET BY MOUTH ONCE DAILY. 12/07/17   Diallo, Lilia Argue, MD  mirtazapine (REMERON) 15 MG tablet Take 15 mg by mouth at bedtime.    [provider]  Multiple Vitamins-Minerals (VITRUM SENIOR) TABS TAKE 1 TABLET BY MOUTH ONCE DAILY. 04/30/15   [provider]  Na Sulfate-K Sulfate-Mg Sulf 17.5-3.13-1.6 GM/177ML SOLN Suprep (no substitutions)-TAKE AS DIRECTED. 01/12/18   Hilarie Fredrickson, MD  omeprazole (PRILOSEC) 40 MG capsule Take 1 capsule (40 mg total) by mouth daily. 10/06/17   Hilarie Fredrickson, MD  sucralfate (CARAFATE) 1 g tablet TAKE 1 TABLET FOUR TIMES DAILY FOR ACID REFLUX. Patient taking differently: Take 1 g by mouth 4 (four) times daily.  06/21/17   Lovena Neighbours, MD    Physical Exam: Vitals:   01/28/18 1915 01/28/18 1945 01/28/18 2030 01/28/18 2103  BP: 114/81 (!) 139/93 (!) 128/92 (!) 138/94  Pulse: (!) 117 (!) 117 (!) 116 (!) 112  Resp: (!) 25 18    Temp:    98.4 F (36.9 C)  TempSrc:    Oral  SpO2: 98% 99% 98% 100%  Weight:      Height:        Constitutional: NAD, calm, comfortable Eyes: PERRL, lids and conjunctivae normal ENMT: Mucous membranes are moist. Posterior pharynx clear of any exudate or lesions.Normal dentition.  Neck: normal, supple, no masses, no thyromegaly Respiratory: clear to auscultation bilaterally, no wheezing, no crackles. Normal  respiratory effort. No accessory muscle use.  Cardiovascular: Regular rate and rhythm, no murmurs / rubs / gallops. No extremity edema. 2+ pedal pulses. Abdomen: no tenderness, no masses palpated. No hepatosplenomegaly. Bowel sounds positive.  Musculoskeletal: no clubbing / cyanosis.  Range of motion of left ankle dorsiflexion and plantarflexion is limited due to pain. Skin: Left ankle is swollen with a boil just above his left lateral malleolus with scant serosanguineous discharge.  He is tender to palpation around his ankle. Neurologic: CN 2-12 grossly intact. Sensation intact, DTR normal. Strength 5/5 in all 4.  Psychiatric: Normal judgment and insight. Alert and oriented x 3. Normal mood.       Labs on Admission: I have personally reviewed following labs and imaging studies  CBC: Recent Labs  Lab 01/28/18 1245  WBC 8.4  NEUTROABS 6.7  HGB 11.4*  HCT 38.3*  MCV 85.1  PLT 529*   Basic Metabolic Panel: Recent Labs  Lab 01/28/18 1245  NA 137  K 3.8  CL 103  CO2 23  GLUCOSE 99  BUN 8  CREATININE 0.97  CALCIUM 8.8*   GFR: Estimated Creatinine Clearance: 100.5 mL/min (by C-G formula based on SCr of 0.97 mg/dL). Liver Function Tests: Recent Labs  Lab 01/28/18 1245  AST 18  ALT 8  ALKPHOS 99  BILITOT 0.6  PROT 7.3  ALBUMIN 3.4*   No results for input(s): LIPASE, AMYLASE in the last 168 hours. No results for input(s): AMMONIA in the last 168 hours. Coagulation Profile: No results for input(s): INR, PROTIME in the last 168 hours. Cardiac Enzymes: No results for input(s): CKTOTAL, CKMB, CKMBINDEX, TROPONINI in the last 168 hours. BNP (last 3 results) No results for input(s): PROBNP in the last 8760 hours. HbA1C: No results for input(s): HGBA1C in the last 72 hours. CBG: No results for input(s): GLUCAP in the last 168 hours. Lipid Profile: No results for input(s): CHOL, HDL, LDLCALC, TRIG, CHOLHDL, LDLDIRECT in the last 72 hours. Thyroid Function Tests: No  results for input(s): TSH, T4TOTAL, FREET4, T3FREE, THYROIDAB in the last 72 hours. Anemia Panel: No results for input(s): VITAMINB12, FOLATE, FERRITIN, TIBC, IRON, RETICCTPCT in the last 72 hours. Urine analysis:    Component Value Date/Time   COLORURINE YELLOW 01/28/2018 1715   APPEARANCEUR CLEAR 01/28/2018 1715   LABSPEC 1.008 01/28/2018 1715   PHURINE 7.0 01/28/2018 1715   GLUCOSEU NEGATIVE 01/28/2018 1715   HGBUR NEGATIVE 01/28/2018 1715   BILIRUBINUR NEGATIVE 01/28/2018 1715   BILIRUBINUR negative 04/10/2016 1702   KETONESUR 20 (A) 01/28/2018 1715   PROTEINUR NEGATIVE 01/28/2018 1715   UROBILINOGEN 0.2 04/10/2016 1702   UROBILINOGEN 0.2 12/04/2013 1044   NITRITE NEGATIVE 01/28/2018 1715   LEUKOCYTESUR NEGATIVE 01/28/2018 1715    Radiological Exams on Admission: Dg Ankle Complete Left  Result Date: 01/28/2018 CLINICAL DATA:  52 y/o M; pain and swelling of the left ankle. Weeping wounds of the lateral left ankle. EXAM: LEFT ANKLE COMPLETE - 3+ VIEW COMPARISON:  03/04/2017 left ankle radiographs. FINDINGS: Screw fixation of the ankle joint. Faint lucency surrounding screw heads may reflect hardware loosening. Soft tissue swelling surrounding the ankle joint. Bony hypertrophy surrounding the ankle joint. Plantar calcaneal enthesophyte. No acute fracture identified. IMPRESSION: Screw fixation of the ankle joint. Faint lucency surrounding screw heads may reflect hardware loosening. Soft tissue swelling surrounding the ankle joint. No acute fracture identified. Electronically Signed   By: Mitzi Hansen M.D.   On: 01/28/2018 13:35    Assessment/Plan Principal Problem:   Septic arthritis of left ankle (HCC) Active Problems:   Tobacco abuse   Benign essential HTN   Post-traumatic osteoarthritis, left ankle and foot   S/P ankle fusion   GERD (gastroesophageal reflux disease)   [name] is a 52 y.o. male with medical history significant for hypertension, asthma, CVA,  schizophrenia, and posttraumatic arthritis of his left ankle with chronic pain who presented to ED with swelling of his left ankle with suspected septic arthritis/osteomyelitis of the joint..   Suspected septic arthritis of left ankle: He is status post ankle fusion July 2019.  Orthopedics, Dr. Lajoyce Corners, and infectious disease, Dr. Orvan Falconer are following. -Holding antibiotics until I&D performed for culture data -Start IV vancomycin and ceftazidime after I&D -ESR 94, CRP 12.6 -Follow-up blood cultures -MRI left ankle ordered -I&D plan for 01/29/2018 -N.p.o. at midnight  Hypertension: Blood pressure improving.  Resume home losartan and amlodipine.  GERD: Protonix, sucralfate  DVT prophylaxis: SCDs Code Status: Full code Family Communication: No family present Disposition Plan: Pending incision and drainage and return of culture data Consults called: Orthopedics, ID Admission status: Inpatient   Darreld Mclean MD [facility] Pager [phone]  If 7PM-7AM, please contact night-coverage [email or web address] Password [email or web address]  01/29/2018, 12:11 AM

## 2018-01-28 NOTE — ED Notes (Signed)
Pt given meal and beverage per Dr. Johnney Killian

## 2018-01-28 NOTE — Telephone Encounter (Signed)
Patient's fiance [name] called stating that patient's left ankle is swollen and draining.  Talked with Dr. Jess Barters assistant Autumn and his PA Shawn and was advised that patient needs to be seen at the ER. Advised patient's fiance [name] of message above.  Stated that she understands.

## 2018-01-28 NOTE — ED Triage Notes (Signed)
Pt to ED with ems for evaluation of his left ankle, pt had surgery sometime this year on his ankle by Dr Sharol Given. Pt now having swelling and yellow drainage from site. Pulses palpable. +ROM. Pt endorses chills at home. nad noted.

## 2018-01-28 NOTE — ED Provider Notes (Signed)
[place] EMERGENCY DEPARTMENT Provider Note   CSN: [id number] Arrival date & time: 01/28/18  1203     History   Chief Complaint Chief Complaint  Patient presents with  . Ankle Pain    HPI [name] is a 52 y.o. male.  HPI Patient reports he had an ankle repair a month and a half ago on the left by Dr. Sharol Given.  He reports this was because of chronic pain and dysfunction after several fractures.  Patient is not diabetic.  He reports he was doing all right initially, recently however he started having a lot of swelling.  He reports over the past couple days is gotten extremely swollen and there is been some drainage from an opening on the side of the ankle.  This is the left ankle.  He reports he felt a little sick over the past couple days.  He had some chills a few days ago.  He reports he feels like his energy is just down some.  He denies any fevers documented.  No nausea or vomiting.  He reports he saw Dr.Duda yesterday and was advised to be seen by infectious disease.  He reports nobody called him ergotamine for an appointment and now is going into the weekend and his ankle is very large and painful.  He reports that the office told him to come to the emergency department. Past Medical History:  Diagnosis Date  . Anxiety   . Arthritis   . Asthma   . Duodenal adenoma   . GERD (gastroesophageal reflux disease)   . Hypertension   . Pneumonia    hx  . Post-traumatic osteoarthritis of left ankle   . Pre-diabetes   . Schizophrenia ([place])   . Stroke [facility])    2009-or10    Patient Active Problem List   Diagnosis Date Noted  . Septic arthritis of left ankle ([place]) 01/28/2018  . Spondylolisthesis, lumbar region 12/30/2017  . S/P ankle fusion 10/20/2017  . Seasonal allergies 06/29/2017  . Post-traumatic osteoarthritis, left ankle and foot 01/07/2017  . Presence of right artificial knee joint 12/17/2016  . Primary osteoarthritis of right knee 05/27/2016  .  GERD (gastroesophageal reflux disease) 07/09/2015  . Mild persistent asthma 07/09/2015  . Type 2 diabetes mellitus ([place]) 07/09/2015  . Polyp of duodenum 06/04/2015  . Upper GI bleed 02/18/2015  . Benign essential HTN 02/18/2015  . Anemia of chronic disease 02/18/2015  . Pulmonary nodule 02/18/2015  . Alcohol dependence with intoxication with complication ([place])   . Cocaine abuse ([place]) 03/02/2014  . Posttraumatic stress disorder 03/02/2014  . History of CVA (cerebrovascular accident) 01/12/2014  . Schizophrenia ([place]) 01/12/2014  . Tobacco abuse 12/06/2013  . Tobacco dependence syndrome 12/06/2013    Past Surgical History:  Procedure Laterality Date  . ANKLE ARTHROSCOPY Left 10/20/2017   Procedure: ANKLE ARTHROSCOPY;  Surgeon: Newt Minion, MD;  Location: [place];  Service: Orthopedics;  Laterality: Left;  . ANKLE FUSION Left 10/20/2017  . ANKLE FUSION Left 10/20/2017   Procedure: LEFT ANKLE FUSION;  Surgeon: Newt Minion, MD;  Location: [place];  Service: Orthopedics;  Laterality: Left;  . ESOPHAGOGASTRODUODENOSCOPY Left 12/05/2013   Procedure: ESOPHAGOGASTRODUODENOSCOPY (EGD);  Surgeon: Arta Silence, MD;  Location: [facility];  Service: Endoscopy;  Laterality: Left;  . ESOPHAGOGASTRODUODENOSCOPY (EGD) WITH PROPOFOL N/A 02/20/2015   Procedure: ESOPHAGOGASTRODUODENOSCOPY (EGD) WITH PROPOFOL;  Surgeon: Wilford Corner, MD;  Location: [facility] ENDOSCOPY;  Service: Endoscopy;  Laterality: N/A;  . TOTAL KNEE ARTHROPLASTY  Right 07/21/2016   Procedure: TOTAL KNEE ARTHROPLASTY;  Surgeon: Meredith Pel, MD;  Location: [place];  Service: Orthopedics;  Laterality: Right;  . UPPER GASTROINTESTINAL ENDOSCOPY          Home Medications    Prior to Admission medications   Medication Sig Start Date End Date Taking? Authorizing Provider  albuterol (PROVENTIL HFA;VENTOLIN HFA) 108 (90 Base) MCG/ACT inhaler Inhale 1 puff into the lungs 2 (two) times daily as needed for wheezing or shortness of breath.  05/14/17   Diallo, Earna Coder, MD  amLODipine (NORVASC) 2.5 MG tablet TAKE 1 TABLET BY MOUTH ONCE DAILY. 12/07/17   Diallo, Earna Coder, MD  benztropine (COGENTIN) 1 MG tablet Take 1 mg by mouth daily.    [provider]  buPROPion (WELLBUTRIN XL) 150 MG 24 hr tablet TAKE 1 TABLET(150 MG) BY MOUTH DAILY Patient taking differently: Take 150 mg by mouth daily.  05/14/17   Diallo, Earna Coder, MD  cetirizine (ZYRTEC) 10 MG tablet Take 1 tablet (10 mg total) by mouth daily. 05/14/17   Diallo, Earna Coder, MD  fluticasone (FLONASE) 50 MCG/ACT nasal spray SHAKE LIQUID AND USE 1 SPRAY IN EACH NOSTRIL DAILY 12/22/17   Diallo, Abdoulaye, MD  ibuprofen (ADVIL,MOTRIN) 600 MG tablet TAKE 1 TABLET(600 MG) BY MOUTH EVERY 6 HOURS AS NEEDED FOR MILD PAIN OR MODERATE PAIN 12/22/17   Diallo, Earna Coder, MD  INVEGA TRINZA 819 MG/2.625ML SUSY Inject 819 mg into the skin every 3 (three) months. 09/08/17   [provider]  losartan (COZAAR) 50 MG tablet TAKE 1 TABLET BY MOUTH ONCE DAILY. 12/07/17   Diallo, Earna Coder, MD  mirtazapine (REMERON) 15 MG tablet Take 15 mg by mouth at bedtime.    [provider]  Multiple Vitamins-Minerals (VITRUM SENIOR) TABS TAKE 1 TABLET BY MOUTH ONCE DAILY. 04/30/15   [provider]  Na Sulfate-K Sulfate-Mg Sulf 17.5-3.13-1.6 GM/177ML SOLN Suprep (no substitutions)-TAKE AS DIRECTED. 01/12/18   Irene Shipper, MD  omeprazole (PRILOSEC) 40 MG capsule Take 1 capsule (40 mg total) by mouth daily. 10/06/17   Irene Shipper, MD  sucralfate (CARAFATE) 1 g tablet TAKE 1 TABLET FOUR TIMES DAILY FOR ACID REFLUX. Patient taking differently: Take 1 g by mouth 4 (four) times daily.  06/21/17   Marjie Skiff, MD    Family History Family History  Adopted: Yes  Problem Relation Age of Onset  . Colon cancer Neg Hx   . Esophageal cancer Neg Hx   . Rectal cancer Neg Hx   . Stomach cancer Neg Hx     Social History Social History   Tobacco Use  . Smoking status: Heavy Tobacco  Smoker    Packs/day: 0.50    Years: 13.00    Pack years: 6.50    Types: Cigarettes  . Smokeless tobacco: Never Used  . Tobacco comment: onset age 40; upto 1.5 ppd  Substance Use Topics  . Alcohol use: Yes    Alcohol/week: 3.0 standard drinks    Types: 3 Cans of beer per week    Comment: quit 2015; formerly up to 2 fifths/day  . Drug use: No     Allergies   Patient has no known allergies.   Review of Systems Review of Systems 10 Systems reviewed and are negative for acute change except as noted in the HPI.   Physical Exam Updated Vital Signs BP (!) 138/102   Pulse (!) 111   Temp 98.4 F (36.9 C) (Oral)   Resp 17   Ht 5\' 9"  (1.753  m)   Wt 93.4 kg   SpO2 98%   BMI 30.41 kg/m   Physical Exam  Constitutional: He is oriented to person, place, and time. He appears well-developed and well-nourished. No distress.  HENT:  Head: Normocephalic and atraumatic.  Eyes: EOM are normal.  Cardiovascular:  Tachycardia.  No rub murmur gallop.  Pulmonary/Chest: Effort normal and breath sounds normal.  Abdominal: Soft. He exhibits no distension. There is no tenderness. There is no guarding.  Musculoskeletal:  Patient has moderate large general swelling of the left ankle.  There is a granulomatous spot on the lateral ankle that has some serous drainage from it.  Her [place] pedis pulses are 2+ and symmetric.  No swelling of the leg or the calf.  See attached images.  Neurological: He is alert and oriented to person, place, and time. He exhibits normal muscle tone. Coordination normal.  Skin: Skin is warm and dry.  Psychiatric: He has a normal mood and affect.         ED Treatments / Results  Labs (all labs ordered are listed, but only abnormal results are displayed) Labs Reviewed  COMPREHENSIVE METABOLIC PANEL - Abnormal; Notable for the following components:      Result Value   Calcium 8.8 (*)    Albumin 3.4 (*)    All other components within normal limits  CBC WITH  DIFFERENTIAL/PLATELET - Abnormal; Notable for the following components:   Hemoglobin 11.4 (*)    HCT 38.3 (*)    MCH 25.3 (*)    MCHC 29.8 (*)    Platelets 529 (*)    All other components within normal limits  URINALYSIS, ROUTINE W REFLEX MICROSCOPIC - Abnormal; Notable for the following components:   Ketones, ur 20 (*)    All other components within normal limits  CULTURE, BLOOD (ROUTINE X 2)  CULTURE, BLOOD (ROUTINE X 2)  I-STAT CG4 LACTIC ACID, ED    EKG None  Radiology Dg Ankle Complete Left  Result Date: 01/28/2018 CLINICAL DATA:  52 y/o M; pain and swelling of the left ankle. Weeping wounds of the lateral left ankle. EXAM: LEFT ANKLE COMPLETE - 3+ VIEW COMPARISON:  03/04/2017 left ankle radiographs. FINDINGS: Screw fixation of the ankle joint. Faint lucency surrounding screw heads may reflect hardware loosening. Soft tissue swelling surrounding the ankle joint. Bony hypertrophy surrounding the ankle joint. Plantar calcaneal enthesophyte. No acute fracture identified. IMPRESSION: Screw fixation of the ankle joint. Faint lucency surrounding screw heads may reflect hardware loosening. Soft tissue swelling surrounding the ankle joint. No acute fracture identified. Electronically Signed   By: Kristine Garbe M.D.   On: 01/28/2018 13:35    Procedures Procedures (including critical care time)  Medications Ordered in ED Medications  vancomycin (VANCOCIN) IVPB 1000 mg/200 mL premix (has no administration in time range)  piperacillin-tazobactam (ZOSYN) IVPB 3.375 g (has no administration in time range)  morphine 4 MG/ML injection 4 mg (4 mg Intravenous Given 01/28/18 1807)     Initial Impression / Assessment and Plan / ED Course  I have reviewed the triage vital signs and the nursing notes.  Pertinent labs & imaging results that were available during my care of the patient were reviewed by me and considered in my medical decision making (see chart for details).  Clinical  Course as of Jan 29 1904  Fri Jan 28, 2018  1631 Consult to orthopedics ordered   [MP]  [place] reordered   [MP]  1748 Consult orthopedics reordered   [  MP]  3524 Ass[place]: Dr. Sharol Given has returned call and advises to start [place].  Admit patient to hospitalist service.  Get inpatient MRI of the ankle.  Ortho will consult on the patient again in the morning.  Cultures are pending from the ankle wound done yesterday.   [MP]  8185 Consult to hospitalist ordered.   [MP]    Clinical Course User Index [MP] Charlesetta Shanks, MD   Presents as outlined above.  Consultation with Dr. due to recommends for admission on Zosyn and vancomycin.  Recommends inpatient MRI to rule out osteomyelitis.  Will consult in a.m.  She is nontoxic and alert.  Mental status is clear.  Vital signs are stable.  At this time patient does not exhibit signs of sepsis.  Final Clinical Impressions(s) / ED Diagnoses   Final diagnoses:  Wound infection    ED Discharge Orders    None       Charlesetta Shanks, MD 01/28/18 1906

## 2018-01-28 NOTE — Consult Note (Signed)
         [facility]ious Disease    Date of Admission:  01/28/2018     Problem List 1. Probable septic arthritis of left ankle 2. Posttraumatic osteoarthritis of left ankle 3. Status post left ankle fusion 10/20/2017  Recommendations 1. Incision and drainage with specimen sent for Gram stain and routine culture 2. Blood cultures 3. Sed rate and C-reactive protein 4. Observe off of antibiotics for now but start IV vancomycin and ceftazidime after I&D  Mr. [name] is a 52 year old who had posttraumatic arthritis of his left ankle with chronic pain.  He underwent hardware fixation and fusion on 10/20/2017.  He fell in his bathroom in early August and twisted his left ankle.  He was evaluated on 11/01/2017 and appeared to be healing nicely after surgery.  He was seen again by Dr. Sharol Given on 11/22/2017 and reported fever of 102 degrees.  He said that he felt like he "messed something up" when he fell.  There is no evidence of infection on exam and x-ray showed stable fusion with no hardware failure.  Apparently he fell again around 01/19/2018 and felt a "pop".  He had difficulty with transportation and could not make an immediate follow-up visit.  His fiance called Dr. Jess Barters office on 01/26/2018 and said that Mr. Gudger had developed a boil on the lateral side of his left ankle leading to his readmission to the hospital today.  He is currently afebrile.  He is normotensive to slightly hypertensive.  I suspect that he has deep infection, possibly involving hardware.  I would prefer to have adequate operative specimens for stain and culture before starting empiric antibiotics.  Would start vancomycin and ceftazidime after surgery to cover staph including MRSA and healthcare associated gram-negative rods.  I will see him tomorrow morning for full consultation.                [name], MD [facility] [phone] pager   [phone] cell 01/28/2018, 6:55 PM

## 2018-01-29 ENCOUNTER — Inpatient Hospital Stay (HOSPITAL_COMMUNITY): Payer: Medicare Other | Admitting: Certified Registered Nurse Anesthetist

## 2018-01-29 ENCOUNTER — Encounter (HOSPITAL_COMMUNITY)
Admission: EM | Disposition: A | Discharge status: Home Health | Payer: Self-pay | Source: Home / Self Care | Attending: Internal Medicine

## 2018-01-29 ENCOUNTER — Encounter (HOSPITAL_COMMUNITY): Payer: Self-pay | Admitting: Certified Registered Nurse Anesthetist

## 2018-01-29 DIAGNOSIS — T1490XS Injury, unspecified, sequela: Secondary | ICD-10-CM

## 2018-01-29 DIAGNOSIS — T148XXA Other injury of unspecified body region, initial encounter: Secondary | ICD-10-CM

## 2018-01-29 DIAGNOSIS — M009 Pyogenic arthritis, unspecified: Secondary | ICD-10-CM

## 2018-01-29 DIAGNOSIS — T8149XA Infection following a procedure, other surgical site, initial encounter: Secondary | ICD-10-CM

## 2018-01-29 DIAGNOSIS — M86272 Subacute osteomyelitis, left ankle and foot: Secondary | ICD-10-CM

## 2018-01-29 DIAGNOSIS — F17210 Nicotine dependence, cigarettes, uncomplicated: Secondary | ICD-10-CM

## 2018-01-29 DIAGNOSIS — T8459XA Infection and inflammatory reaction due to other internal joint prosthesis, initial encounter: Principal | ICD-10-CM

## 2018-01-29 DIAGNOSIS — W19XXXS Unspecified fall, sequela: Secondary | ICD-10-CM

## 2018-01-29 DIAGNOSIS — G8929 Other chronic pain: Secondary | ICD-10-CM

## 2018-01-29 DIAGNOSIS — Z981 Arthrodesis status: Secondary | ICD-10-CM

## 2018-01-29 DIAGNOSIS — L089 Local infection of the skin and subcutaneous tissue, unspecified: Secondary | ICD-10-CM

## 2018-01-29 DIAGNOSIS — M19172 Post-traumatic osteoarthritis, left ankle and foot: Secondary | ICD-10-CM

## 2018-01-29 DIAGNOSIS — Z96662 Presence of left artificial ankle joint: Secondary | ICD-10-CM

## 2018-01-29 HISTORY — PX: HARDWARE REMOVAL: SHX979

## 2018-01-29 LAB — CBC
HCT: 35.9 % — ABNORMAL LOW (ref 39.0–52.0)
Hemoglobin: 11 g/dL — ABNORMAL LOW (ref 13.0–17.0)
MCH: 25.2 pg — AB (ref 26.0–34.0)
MCHC: 30.6 g/dL (ref 30.0–36.0)
MCV: 82.3 fL (ref 80.0–100.0)
Platelets: 522 10*3/uL — ABNORMAL HIGH (ref 150–400)
RBC: 4.36 MIL/uL (ref 4.22–5.81)
RDW: 14.9 % (ref 11.5–15.5)
WBC: 7 10*3/uL (ref 4.0–10.5)
nRBC: 0 % (ref 0.0–0.2)

## 2018-01-29 LAB — BASIC METABOLIC PANEL
ANION GAP: 8 (ref 5–15)
BUN: 11 mg/dL (ref 6–20)
CALCIUM: 8.7 mg/dL — AB (ref 8.9–10.3)
CO2: 21 mmol/L — ABNORMAL LOW (ref 22–32)
CREATININE: 1.05 mg/dL (ref 0.61–1.24)
Chloride: 109 mmol/L (ref 98–111)
GLUCOSE: 75 mg/dL (ref 70–99)
Potassium: 4.2 mmol/L (ref 3.5–5.1)
Sodium: 138 mmol/L (ref 135–145)

## 2018-01-29 LAB — SURGICAL PCR SCREEN
MRSA, PCR: NEGATIVE
Staphylococcus aureus: NEGATIVE

## 2018-01-29 LAB — HIV ANTIBODY (ROUTINE TESTING W REFLEX): HIV SCREEN 4TH GENERATION: NONREACTIVE

## 2018-01-29 LAB — GLUCOSE, CAPILLARY: Glucose-Capillary: 128 mg/dL — ABNORMAL HIGH (ref 70–99)

## 2018-01-29 SURGERY — REMOVAL, HARDWARE
Anesthesia: General | Site: Ankle | Laterality: Left

## 2018-01-29 MED ORDER — METOCLOPRAMIDE HCL 5 MG/ML IJ SOLN: 5.0000 mg | Freq: Three times a day (TID) | INTRAMUSCULAR | Status: DC | PRN

## 2018-01-29 MED ORDER — DOCUSATE SODIUM 100 MG PO CAPS
100.0000 mg | ORAL_CAPSULE | Freq: Two times a day (BID) | ORAL | Status: DC
  Administered 2018-01-29 – 2018-01-31 (×3): 100 mg via ORAL
  Filled 2018-01-29 (×4): qty 1

## 2018-01-29 MED ORDER — BISACODYL 10 MG RE SUPP: 10.0000 mg | Freq: Every day | RECTAL | Status: DC | PRN

## 2018-01-29 MED ORDER — METOCLOPRAMIDE HCL 5 MG PO TABS: 5.0000 mg | ORAL_TABLET | Freq: Three times a day (TID) | ORAL | Status: DC | PRN

## 2018-01-29 MED ORDER — OXYCODONE HCL 5 MG PO TABS
5.0000 mg | ORAL_TABLET | ORAL | Status: DC | PRN
  Administered 2018-01-29 – 2018-01-30 (×3): 10 mg via ORAL
  Filled 2018-01-29 (×2): qty 2

## 2018-01-29 MED ORDER — HYDROMORPHONE HCL 1 MG/ML IJ SOLN
0.2500 mg | INTRAMUSCULAR | Status: DC | PRN
  Administered 2018-01-29 (×2): 0.25 mg via INTRAVENOUS

## 2018-01-29 MED ORDER — ONDANSETRON HCL 4 MG/2ML IJ SOLN
INTRAMUSCULAR | Status: AC
  Filled 2018-01-29: qty 2

## 2018-01-29 MED ORDER — VANCOMYCIN HCL 10 G IV SOLR
2000.0000 mg | Freq: Once | INTRAVENOUS | Status: AC
  Administered 2018-01-29: 2000 mg via INTRAVENOUS
  Filled 2018-01-29: qty 2000

## 2018-01-29 MED ORDER — OXYCODONE HCL 5 MG PO TABS
ORAL_TABLET | ORAL | Status: AC
  Filled 2018-01-29: qty 1

## 2018-01-29 MED ORDER — DEXAMETHASONE SODIUM PHOSPHATE 10 MG/ML IJ SOLN
INTRAMUSCULAR | Status: DC | PRN
  Administered 2018-01-29: 4 mg via INTRAVENOUS

## 2018-01-29 MED ORDER — SUCCINYLCHOLINE CHLORIDE 200 MG/10ML IV SOSY
PREFILLED_SYRINGE | INTRAVENOUS | Status: AC
  Filled 2018-01-29: qty 10

## 2018-01-29 MED ORDER — 0.9 % SODIUM CHLORIDE (POUR BTL) OPTIME
TOPICAL | Status: DC | PRN
  Administered 2018-01-29: 1000 mL

## 2018-01-29 MED ORDER — METHOCARBAMOL 1000 MG/10ML IJ SOLN
500.0000 mg | Freq: Four times a day (QID) | INTRAVENOUS | Status: DC | PRN
  Filled 2018-01-29: qty 5

## 2018-01-29 MED ORDER — PHENYLEPHRINE HCL 10 MG/ML IJ SOLN
INTRAMUSCULAR | Status: DC | PRN
  Administered 2018-01-29: 120 ug via INTRAVENOUS

## 2018-01-29 MED ORDER — ONDANSETRON HCL 4 MG PO TABS: 4.0000 mg | ORAL_TABLET | Freq: Four times a day (QID) | ORAL | Status: DC | PRN

## 2018-01-29 MED ORDER — SODIUM CHLORIDE 0.9 % IV SOLN
1.0000 g | Freq: Three times a day (TID) | INTRAVENOUS | Status: DC
  Administered 2018-01-29 – 2018-01-31 (×6): 1 g via INTRAVENOUS
  Filled 2018-01-29 (×7): qty 1

## 2018-01-29 MED ORDER — MEPERIDINE HCL 50 MG/ML IJ SOLN: 6.2500 mg | INTRAMUSCULAR | Status: DC | PRN

## 2018-01-29 MED ORDER — PROMETHAZINE HCL 25 MG/ML IJ SOLN: 6.2500 mg | INTRAMUSCULAR | Status: DC | PRN

## 2018-01-29 MED ORDER — LACTATED RINGERS IV SOLN
INTRAVENOUS | Status: DC
  Administered 2018-01-29 (×2): via INTRAVENOUS

## 2018-01-29 MED ORDER — INFLUENZA VAC SPLIT QUAD 0.5 ML IM SUSY
0.5000 mL | PREFILLED_SYRINGE | INTRAMUSCULAR | Status: AC | PRN
  Administered 2018-01-31: 0.5 mL via INTRAMUSCULAR

## 2018-01-29 MED ORDER — ACETAMINOPHEN 325 MG PO TABS
325.0000 mg | ORAL_TABLET | Freq: Four times a day (QID) | ORAL | Status: DC | PRN
  Administered 2018-01-30: 325 mg via ORAL

## 2018-01-29 MED ORDER — CHLORHEXIDINE GLUCONATE 4 % EX LIQD: 60.0000 mL | Freq: Once | CUTANEOUS | Status: DC

## 2018-01-29 MED ORDER — MORPHINE SULFATE (PF) 2 MG/ML IV SOLN
2.0000 mg | INTRAVENOUS | Status: AC | PRN
  Administered 2018-01-29 (×3): 2 mg via INTRAVENOUS
  Filled 2018-01-29 (×3): qty 1

## 2018-01-29 MED ORDER — OXYCODONE HCL 5 MG/5ML PO SOLN: 5.0000 mg | Freq: Once | ORAL | Status: AC | PRN

## 2018-01-29 MED ORDER — PROPOFOL 10 MG/ML IV BOLUS
INTRAVENOUS | Status: DC | PRN
  Administered 2018-01-29: 160 mg via INTRAVENOUS
  Administered 2018-01-29: 40 mg via INTRAVENOUS

## 2018-01-29 MED ORDER — OXYCODONE HCL 5 MG PO TABS
10.0000 mg | ORAL_TABLET | ORAL | Status: DC | PRN
  Administered 2018-01-30: 10 mg via ORAL
  Administered 2018-01-30 – 2018-01-31 (×5): 15 mg via ORAL
  Filled 2018-01-29 (×4): qty 3
  Filled 2018-01-29: qty 2
  Filled 2018-01-29 (×2): qty 3
  Filled 2018-01-29: qty 2

## 2018-01-29 MED ORDER — SODIUM CHLORIDE 0.9 % IV SOLN
INTRAVENOUS | Status: DC
  Administered 2018-01-29: 16:00:00 via INTRAVENOUS

## 2018-01-29 MED ORDER — MIDAZOLAM HCL 2 MG/2ML IJ SOLN
INTRAMUSCULAR | Status: AC
  Filled 2018-01-29: qty 2

## 2018-01-29 MED ORDER — POVIDONE-IODINE 10 % EX SWAB: 2.0000 "application " | Freq: Once | CUTANEOUS | Status: DC

## 2018-01-29 MED ORDER — FENTANYL CITRATE (PF) 100 MCG/2ML IJ SOLN
INTRAMUSCULAR | Status: DC | PRN
  Administered 2018-01-29 (×5): 50 ug via INTRAVENOUS

## 2018-01-29 MED ORDER — ONDANSETRON HCL 4 MG/2ML IJ SOLN: 4.0000 mg | Freq: Four times a day (QID) | INTRAMUSCULAR | Status: DC | PRN

## 2018-01-29 MED ORDER — ONDANSETRON HCL 4 MG/2ML IJ SOLN
INTRAMUSCULAR | Status: DC | PRN
  Administered 2018-01-29: 4 mg via INTRAVENOUS

## 2018-01-29 MED ORDER — MAGNESIUM CITRATE PO SOLN: 1.0000 | Freq: Once | ORAL | Status: DC | PRN

## 2018-01-29 MED ORDER — PROPOFOL 10 MG/ML IV BOLUS
INTRAVENOUS | Status: AC
  Filled 2018-01-29: qty 20

## 2018-01-29 MED ORDER — MIDAZOLAM HCL 5 MG/5ML IJ SOLN
INTRAMUSCULAR | Status: DC | PRN
  Administered 2018-01-29: 2 mg via INTRAVENOUS

## 2018-01-29 MED ORDER — HYDROMORPHONE HCL 1 MG/ML IJ SOLN
INTRAMUSCULAR | Status: AC
  Filled 2018-01-29: qty 1

## 2018-01-29 MED ORDER — EPHEDRINE 5 MG/ML INJ
INTRAVENOUS | Status: AC
  Filled 2018-01-29: qty 10

## 2018-01-29 MED ORDER — FENTANYL CITRATE (PF) 250 MCG/5ML IJ SOLN
INTRAMUSCULAR | Status: AC
  Filled 2018-01-29: qty 5

## 2018-01-29 MED ORDER — VANCOMYCIN HCL IN DEXTROSE 1-5 GM/200ML-% IV SOLN
1000.0000 mg | Freq: Three times a day (TID) | INTRAVENOUS | Status: DC
  Administered 2018-01-29 – 2018-01-31 (×5): 1000 mg via INTRAVENOUS
  Filled 2018-01-29 (×6): qty 200

## 2018-01-29 MED ORDER — HYDROMORPHONE HCL 1 MG/ML IJ SOLN
0.5000 mg | INTRAMUSCULAR | Status: DC | PRN
  Administered 2018-01-29 – 2018-01-31 (×3): 1 mg via INTRAVENOUS
  Filled 2018-01-29 (×3): qty 1

## 2018-01-29 MED ORDER — POLYETHYLENE GLYCOL 3350 17 G PO PACK: 17.0000 g | PACK | Freq: Every day | ORAL | Status: DC | PRN

## 2018-01-29 MED ORDER — METHOCARBAMOL 500 MG PO TABS
500.0000 mg | ORAL_TABLET | Freq: Four times a day (QID) | ORAL | Status: DC | PRN
  Administered 2018-01-29 – 2018-01-31 (×6): 500 mg via ORAL
  Filled 2018-01-29 (×6): qty 1

## 2018-01-29 MED ORDER — MUPIROCIN 2 % EX OINT: 1.0000 "application " | TOPICAL_OINTMENT | Freq: Two times a day (BID) | CUTANEOUS | Status: DC

## 2018-01-29 MED ORDER — OXYCODONE HCL 5 MG PO TABS
5.0000 mg | ORAL_TABLET | Freq: Once | ORAL | Status: AC | PRN
  Administered 2018-01-29: 5 mg via ORAL

## 2018-01-29 MED ORDER — LIDOCAINE HCL (CARDIAC) PF 100 MG/5ML IV SOSY
PREFILLED_SYRINGE | INTRAVENOUS | Status: DC | PRN
  Administered 2018-01-29: 100 mg via INTRATRACHEAL

## 2018-01-29 SURGICAL SUPPLY — 52 items
BANDAGE ESMARK 6X9 LF (GAUZE/BANDAGES/DRESSINGS) ×1 IMPLANT
BNDG COHESIVE 4X5 TAN STRL (GAUZE/BANDAGES/DRESSINGS) ×2 IMPLANT
BNDG ESMARK 6X9 LF (GAUZE/BANDAGES/DRESSINGS) ×2
BNDG GAUZE ELAST 4 BULKY (GAUZE/BANDAGES/DRESSINGS) ×2 IMPLANT
COVER SURGICAL LIGHT HANDLE (MISCELLANEOUS) ×4 IMPLANT
COVER WAND RF STERILE (DRAPES) ×2 IMPLANT
CUFF TOURNIQUET SINGLE 34IN LL (TOURNIQUET CUFF) IMPLANT
CUFF TOURNIQUET SINGLE 44IN (TOURNIQUET CUFF) IMPLANT
DRAPE C-ARM 42X72 X-RAY (DRAPES) IMPLANT
DRAPE INCISE IOBAN 66X45 STRL (DRAPES) IMPLANT
DRAPE OEC MINIVIEW 54X84 (DRAPES) ×2 IMPLANT
DRAPE ORTHO SPLIT 77X108 STRL (DRAPES)
DRAPE SURG ORHT 6 SPLT 77X108 (DRAPES) IMPLANT
DRAPE U-SHAPE 47X51 STRL (DRAPES) IMPLANT
DRSG EMULSION OIL 3X3 NADH (GAUZE/BANDAGES/DRESSINGS) ×2 IMPLANT
DRSG PAD ABDOMINAL 8X10 ST (GAUZE/BANDAGES/DRESSINGS) ×2 IMPLANT
DURAPREP 26ML APPLICATOR (WOUND CARE) ×2 IMPLANT
ELECT REM PT RETURN 9FT ADLT (ELECTROSURGICAL) ×2
ELECTRODE REM PT RTRN 9FT ADLT (ELECTROSURGICAL) ×1 IMPLANT
GAUZE SPONGE 4X4 12PLY STRL (GAUZE/BANDAGES/DRESSINGS) ×2 IMPLANT
GAUZE SPONGE 4X4 12PLY STRL LF (GAUZE/BANDAGES/DRESSINGS) ×2 IMPLANT
GLOVE BIOGEL PI IND STRL 9 (GLOVE) ×1 IMPLANT
GLOVE BIOGEL PI INDICATOR 9 (GLOVE) ×1
GLOVE SURG ORTHO 9.0 STRL STRW (GLOVE) ×2 IMPLANT
GOWN STRL REUS W/ TWL XL LVL3 (GOWN DISPOSABLE) ×2 IMPLANT
GOWN STRL REUS W/TWL XL LVL3 (GOWN DISPOSABLE) ×2
KIT BASIN OR (CUSTOM PROCEDURE TRAY) ×2 IMPLANT
KIT TURNOVER KIT B (KITS) ×2 IMPLANT
MANIFOLD NEPTUNE II (INSTRUMENTS) ×2 IMPLANT
NS IRRIG 1000ML POUR BTL (IV SOLUTION) ×2 IMPLANT
PACK ORTHO EXTREMITY (CUSTOM PROCEDURE TRAY) ×2 IMPLANT
PAD ABD 8X10 STRL (GAUZE/BANDAGES/DRESSINGS) ×2 IMPLANT
PAD ARMBOARD 7.5X6 YLW CONV (MISCELLANEOUS) ×4 IMPLANT
PENCIL BUTTON BLDE SNGL 10FT (ELECTRODE) ×2 IMPLANT
PENCIL BUTTON HOLSTER BLD 10FT (ELECTRODE) ×2 IMPLANT
SPONGE LAP 18X18 X RAY DECT (DISPOSABLE) IMPLANT
STAPLER VISISTAT 35W (STAPLE) ×2 IMPLANT
STOCKINETTE IMPERVIOUS 9X36 MD (GAUZE/BANDAGES/DRESSINGS) IMPLANT
SUCTION FRAZIER TIP 10 FR DISP (SUCTIONS) ×2 IMPLANT
SUT ETHILON 2 0 PSLX (SUTURE) ×2 IMPLANT
SUT VIC AB 0 CT1 27 (SUTURE)
SUT VIC AB 0 CT1 27XBRD ANBCTR (SUTURE) IMPLANT
SUT VIC AB 2-0 CT1 27 (SUTURE)
SUT VIC AB 2-0 CT1 TAPERPNT 27 (SUTURE) IMPLANT
SWAB COLLECTION DEVICE MRSA (MISCELLANEOUS) ×2 IMPLANT
SWAB CULTURE ESWAB REG 1ML (MISCELLANEOUS) ×2 IMPLANT
TOWEL OR 17X24 6PK STRL BLUE (TOWEL DISPOSABLE) ×2 IMPLANT
TOWEL OR 17X26 10 PK STRL BLUE (TOWEL DISPOSABLE) ×2 IMPLANT
TUBING BULK SUCTION (MISCELLANEOUS) ×2 IMPLANT
UNDERPAD 30X30 (UNDERPADS AND DIAPERS) ×2 IMPLANT
WATER STERILE IRR 1000ML POUR (IV SOLUTION) ×2 IMPLANT
YANKAUER SUCT BULB TIP NO VENT (SUCTIONS) ×2 IMPLANT

## 2018-01-29 NOTE — Consult Note (Addendum)
ORTHOPAEDIC CONSULTATION  REQUESTING PHYSICIAN: Bonnell Public, MD  Chief Complaint: Pain swelling and ulceration left ankle.  HPI: [name] is a 52 y.o. male who presents with ulceration medial left ankle status post arthroscopic fusion on 10/20/2017.  Patient was seen in the office on 01/27/2018, 2 days ago.  Deep cultures were obtained of the ulcer in the office.  The Gram stain is negative today.  Patient was scheduled for a urgent MRI scan of the ankle to determine the extent of the infection.  The next day patient presented to the emergency room complaining of pain and swelling.  Past Medical History:  Diagnosis Date  . Anxiety   . Arthritis   . Asthma   . Duodenal adenoma   . GERD (gastroesophageal reflux disease)   . Hypertension   . Pneumonia    hx  . Post-traumatic osteoarthritis of left ankle   . Pre-diabetes   . Schizophrenia ([place])   . Stroke [facility])    2009-or10   Past Surgical History:  Procedure Laterality Date  . ANKLE ARTHROSCOPY Left 10/20/2017   Procedure: ANKLE ARTHROSCOPY;  Surgeon: Newt Minion, MD;  Location: [place];  Service: Orthopedics;  Laterality: Left;  . ANKLE FUSION Left 10/20/2017  . ANKLE FUSION Left 10/20/2017   Procedure: LEFT ANKLE FUSION;  Surgeon: Newt Minion, MD;  Location: [place];  Service: Orthopedics;  Laterality: Left;  . ESOPHAGOGASTRODUODENOSCOPY Left 12/05/2013   Procedure: ESOPHAGOGASTRODUODENOSCOPY (EGD);  Surgeon: Arta Silence, MD;  Location: [facility];  Service: Endoscopy;  Laterality: Left;  . ESOPHAGOGASTRODUODENOSCOPY (EGD) WITH PROPOFOL N/A 02/20/2015   Procedure: ESOPHAGOGASTRODUODENOSCOPY (EGD) WITH PROPOFOL;  Surgeon: Wilford Corner, MD;  Location: [facility];  Service: Endoscopy;  Laterality: N/A;  . TOTAL KNEE ARTHROPLASTY Right 07/21/2016   Procedure: TOTAL KNEE ARTHROPLASTY;  Surgeon: Meredith Pel, MD;  Location: [place];  Service: Orthopedics;  Laterality: Right;  . UPPER GASTROINTESTINAL  ENDOSCOPY     Social History   Socioeconomic History  . Marital status: Single    Spouse name: Not on file  . Number of children: Not on file  . Years of education: Not on file  . Highest education level: Not on file  Occupational History  . Not on file  Social Needs  . Financial resource strain: Not on file  . Food insecurity:    Worry: Not on file    Inability: Not on file  . Transportation needs:    Medical: Not on file    Non-medical: Not on file  Tobacco Use  . Smoking status: Heavy Tobacco Smoker    Packs/day: 1.50    Years: 38.00    Pack years: 57.00    Types: Cigarettes  . Smokeless tobacco: Never Used  . Tobacco comment: onset age 43; upto 1.5 ppd  Substance and Sexual Activity  . Alcohol use: Not Currently    Alcohol/week: 3.0 standard drinks    Types: 3 Cans of beer per week    Comment: quit 2015; formerly up to 2 fifths/day  . Drug use: No  . Sexual activity: Yes  Lifestyle  . Physical activity:    Days per week: Not on file    Minutes per session: Not on file  . Stress: Not on file  Relationships  . Social connections:    Talks on phone: Not on file    Gets together: Not on file    Attends religious service: Not on file    Active member of  club or organization: Not on file    Attends meetings of clubs or organizations: Not on file    Relationship status: Not on file  Other Topics Concern  . Not on file  Social History Narrative  . Not on file   Family History  Adopted: Yes  Problem Relation Age of Onset  . Colon cancer Neg Hx   . Esophageal cancer Neg Hx   . Rectal cancer Neg Hx   . Stomach cancer Neg Hx    - negative except otherwise stated in the family history section No Known Allergies Prior to Admission medications   Medication Sig Start Date End Date Taking? Authorizing Provider  albuterol (PROVENTIL HFA;VENTOLIN HFA) 108 (90 Base) MCG/ACT inhaler Inhale 1 puff into the lungs 2 (two) times daily as needed for wheezing or shortness of  breath. 05/14/17   Diallo, Earna Coder, MD  amLODipine (NORVASC) 2.5 MG tablet TAKE 1 TABLET BY MOUTH ONCE DAILY. 12/07/17   Diallo, Earna Coder, MD  benztropine (COGENTIN) 1 MG tablet Take 1 mg by mouth daily.    [provider]  buPROPion (WELLBUTRIN XL) 150 MG 24 hr tablet TAKE 1 TABLET(150 MG) BY MOUTH DAILY Patient taking differently: Take 150 mg by mouth daily.  05/14/17   Diallo, Earna Coder, MD  cetirizine (ZYRTEC) 10 MG tablet Take 1 tablet (10 mg total) by mouth daily. 05/14/17   Diallo, Earna Coder, MD  fluticasone (FLONASE) 50 MCG/ACT nasal spray SHAKE LIQUID AND USE 1 SPRAY IN EACH NOSTRIL DAILY 12/22/17   Diallo, Abdoulaye, MD  ibuprofen (ADVIL,MOTRIN) 600 MG tablet TAKE 1 TABLET(600 MG) BY MOUTH EVERY 6 HOURS AS NEEDED FOR MILD PAIN OR MODERATE PAIN 12/22/17   Diallo, Earna Coder, MD  INVEGA TRINZA 819 MG/2.625ML SUSY Inject 819 mg into the skin every 3 (three) months. 09/08/17   [provider]  losartan (COZAAR) 50 MG tablet TAKE 1 TABLET BY MOUTH ONCE DAILY. 12/07/17   Diallo, Earna Coder, MD  mirtazapine (REMERON) 15 MG tablet Take 15 mg by mouth at bedtime.    [provider]  Multiple Vitamins-Minerals (VITRUM SENIOR) TABS TAKE 1 TABLET BY MOUTH ONCE DAILY. 04/30/15   [provider]  Na Sulfate-K Sulfate-Mg Sulf 17.5-3.13-1.6 GM/177ML SOLN Suprep (no substitutions)-TAKE AS DIRECTED. 01/12/18   Irene Shipper, MD  omeprazole (PRILOSEC) 40 MG capsule Take 1 capsule (40 mg total) by mouth daily. 10/06/17   Irene Shipper, MD  sucralfate (CARAFATE) 1 g tablet TAKE 1 TABLET FOUR TIMES DAILY FOR ACID REFLUX. Patient taking differently: Take 1 g by mouth 4 (four) times daily.  06/21/17   Marjie Skiff, MD   Dg Ankle Complete Left  Result Date: 01/28/2018 CLINICAL DATA:  52 y/o M; pain and swelling of the left ankle. Weeping wounds of the lateral left ankle. EXAM: LEFT ANKLE COMPLETE - 3+ VIEW COMPARISON:  03/04/2017 left ankle radiographs. FINDINGS: Screw fixation of  the ankle joint. Faint lucency surrounding screw heads may reflect hardware loosening. Soft tissue swelling surrounding the ankle joint. Bony hypertrophy surrounding the ankle joint. Plantar calcaneal enthesophyte. No acute fracture identified. IMPRESSION: Screw fixation of the ankle joint. Faint lucency surrounding screw heads may reflect hardware loosening. Soft tissue swelling surrounding the ankle joint. No acute fracture identified. Electronically Signed   By: Kristine Garbe M.D.   On: 01/28/2018 13:35   - pertinent xrays, CT, MRI studies were reviewed and independently interpreted  Positive ROS: All other systems have been reviewed and were otherwise negative with the exception of those mentioned  in the HPI and as above.  Physical Exam: General: Alert, no acute distress Psychiatric: Patient is competent for consent with normal mood and affect Lymphatic: No axillary or cervical lymphadenopathy Cardiovascular: No pedal edema Respiratory: No cyanosis, no use of accessory musculature GI: No organomegaly, abdomen is soft and non-tender    Images:  @ENCIMAGES @  Labs:  Lab Results  Component Value Date   HGBA1C 5.6 11/05/2016   HGBA1C 6.0 (H) 07/10/2016   HGBA1C 5.8 04/10/2016   ESRSEDRATE 94 (H) 01/28/2018   ESRSEDRATE 9 11/05/2016   ESRSEDRATE 7 10/26/2016   CRP 12.6 (H) 01/28/2018   CRP 21.9 (H) 11/05/2016   CRP 82.8 (H) 10/26/2016   LABURIC 5.2 04/17/2014   REPTSTATUS 07/12/2016 FINAL 07/10/2016   GRAMSTAIN Rare 10/29/2016   GRAMSTAIN WBC present-both PMN and Mononuclear 10/29/2016   GRAMSTAIN No Organisms Seen 10/29/2016   CULT 70,000 COLONIES/mL CITROBACTER KOSERI (A) 07/10/2016   LABORGA CITROBACTER KOSERI (A) 07/10/2016    Lab Results  Component Value Date   ALBUMIN 3.4 (L) 01/28/2018   ALBUMIN 4.3 06/29/2017   ALBUMIN 4.1 04/28/2017   LABURIC 5.2 04/17/2014    Neurologic: Patient does not have protective sensation bilateral lower  extremities.   MUSCULOSKELETAL:   Skin: Examination of the swelling of the left ankle is less than it was 2 days ago.  The ulcer shows no drainage at this time.  The radiographs shows no lucency around the deep hardware.  There is no fluctuance around the ankle.  Patient's vital signs are stable he is afebrile.  CRP of 12.6 with a sed rate of 94.  Assessment: Assessment: Suspect deep osteomyelitis of the left ankle.  With 2 retained screws 6.5 mm in diameter headless cannulated screws. Plan: Deep cultures are pending that was obtained off antibiotics.  I will plan for removal of the deep retained screws today, obtain additional cultures and then obtain an MRI scan after the screws are removed to see the extent of the bone infection.  I will reorder the MRI scan.    Thank you for the consult and the opportunity to see Mr. [place], MD [facility] [phone] 9:13 AM

## 2018-01-29 NOTE — Progress Notes (Signed)
RN verified the presence of a signed informed consent that matches stated procedure by patient. Verified armband matches patient's stated name and birth date. Verified NPO status and that all jewelry, contact, glasses, dentures, and partials had been removed (if applicable).  

## 2018-01-29 NOTE — Plan of Care (Signed)
  Problem: Education: °Goal: Knowledge of General Education information will improve °Description: Including pain rating scale, medication(s)/side effects and non-pharmacologic comfort measures °Outcome: Progressing °  °Problem: Clinical Measurements: °Goal: Will remain free from infection °Outcome: Progressing °  °Problem: Activity: °Goal: Risk for activity intolerance will decrease °Outcome: Progressing °  °Problem: Pain Managment: °Goal: General experience of comfort will improve °Outcome: Progressing °  °Problem: Skin Integrity: °Goal: Risk for impaired skin integrity will decrease °Outcome: Progressing °  °

## 2018-01-29 NOTE — Transfer of Care (Signed)
Immediate Anesthesia Transfer of Care Note  Patient: [name]  Procedure(s) Performed: HARDWARE REMOVAL LEFT ANKLE (Left Ankle)  Patient Location: PACU  Anesthesia Type:General  Level of Consciousness: awake, alert , oriented and patient cooperative  Airway & Oxygen Therapy: Patient Spontanous Breathing and Patient connected to nasal cannula oxygen  Post-op Assessment: Report given to RN, Post -op Vital signs reviewed and stable and Patient moving all extremities X 4  Post vital signs: Reviewed and stable  Last Vitals:  Vitals Value Taken Time  BP 124/88 01/29/2018 11:48 AM  Temp    Pulse 112 01/29/2018 11:50 AM  Resp 19 01/29/2018 11:50 AM  SpO2 99 % 01/29/2018 11:50 AM  Vitals shown include unvalidated device data.  Last Pain:  Vitals:   01/29/18 0730  TempSrc:   PainSc: Asleep         Complications: No apparent anesthesia complications

## 2018-01-29 NOTE — Op Note (Signed)
01/28/2018 - 01/29/2018  11:36 AM  PATIENT:  [name]    PRE-OPERATIVE DIAGNOSIS:  Septic left ankle  POST-OPERATIVE DIAGNOSIS:  Same  PROCEDURE: Removal of deep retained hardware left ankle. Partial removal of tibia. Obtain deep cultures x3. C-arm fluoroscopy to assist with hardware removal.  SURGEON:  Newt Minion, MD  PHYSICIAN ASSISTANT:None ANESTHESIA:   General  PREOPERATIVE INDICATIONS:  [name] is a  52 y.o. male with a diagnosis of hardware removal left ankle who failed conservative measures and elected for surgical management.    The risks benefits and alternatives were discussed with the patient preoperatively including but not limited to the risks of infection, bleeding, nerve injury, cardiopulmonary complications, the need for revision surgery, among others, and the patient was willing to proceed.  OPERATIVE IMPLANTS: Removal of deep retained hardware.  @ENCIMAGES @  OPERATIVE FINDINGS: No purulent abscess soft tissue bone and fluid sent for cultures.  OPERATIVE PROCEDURE: Patient was brought to the operating room underwent a general anesthetic.  After adequate levels of anesthesia were obtained patient's left lower extremity was prepped using DuraPrep draped in the sterile field a timeout was called. No preoperative antibiotics were given to ensure good wound cultures.  Antibiotic coverage to be determined by infectious disease this afternoon. .  Using C-arm fluoroscopy for guidance of the 2 incisions were made medially and laterally these were carried down to the hardware.  The medial and lateral screws were removed without complications.  The soft tissue and fluid around the screws was swabbed and sent for cultures.  A drill bit was then used to debride through the tunnels and the drill bone debridement was sent in a separate culture.  A curette was also used to further debride the tibial tunnels with debridement of bone and soft tissue these were irrigated  and the incisions were loosely closed with 2-0 nylon.  A sterile dressing was applied patient was extubated taken the PACU in stable condition.   DISCHARGE PLANNING:  Antibiotic duration: IV antibiotics to be started by infectious disease this afternoon  Weightbearing: Weightbearing as tolerated on the left  Pain medication: Opioid pathway ordered  Dressing care/ Wound VAC: Change dressing as needed.  Ambulatory devices: Walker or crutches.  Discharge to: Pending results of further studies and culture results.  Follow-up: In the office 1 week post operative.

## 2018-01-29 NOTE — Anesthesia Procedure Notes (Signed)
Procedure Name: LMA Insertion Date/Time: 01/29/2018 11:01 AM Performed by: Carney Living, CRNA Pre-anesthesia Checklist: Patient identified, Emergency Drugs available, Suction available, Patient being monitored and Timeout performed Patient Re-evaluated:Patient Re-evaluated prior to induction Oxygen Delivery Method: Circle system utilized Preoxygenation: Pre-oxygenation with 100% oxygen Induction Type: IV induction LMA: LMA inserted LMA Size: 5.0 Number of attempts: 1 Placement Confirmation: positive ETCO2 and breath sounds checked- equal and bilateral Tube secured with: Tape Dental Injury: Teeth and Oropharynx as per pre-operative assessment

## 2018-01-29 NOTE — Progress Notes (Signed)
Pharmacy Antibiotic Note  [name] is a 52 y.o. male admitted on 01/28/2018 with wound infection.  Pharmacy has been consulted for vancomycin dosing.  Patient is s/p I&D and hardware removal of the left ankle on 11/2. No purulent abscess noted. Blood, wound, and abscess cultures taken from OR. ID recommended vancomycin and ceftazidime to start after ortho procedure.   Plan: Vancomycin 2,000 mg IV x 1, followed by vancomycin 1,000 mg IV q8h Ceftazidime 1 g IV q8h per MD Monitor C&S, renal function, clinical status, de-escalation and vancomycin levels as needed  Height: 5\' 9"  (175.3 cm) Weight: 205 lb 14.6 oz (93.4 kg) IBW/kg (Calculated) : 70.7  Temp (24hrs), Avg:98.2 F (36.8 C), Min:97.7 F (36.5 C), Max:98.4 F (36.9 C)  Recent Labs  Lab 01/28/18 1245 01/28/18 1300 01/29/18 0305  WBC 8.4  --  7.0  CREATININE 0.97  --  1.05  LATICACIDVEN  --  0.68  --     Estimated Creatinine Clearance: 92.9 mL/min (by C-G formula based on SCr of 1.05 mg/dL).    No Known Allergies  Antimicrobials this admission: Vancomycin 11/2 >>  Ceftazidime 11/2 >>   Dose adjustments this admission: None  Microbiology results: 11/1 BCx: pending 11/2 MRSA PCR: negative 11/2 surgical/deep wound (tissue): pending 11/2 surgical/deep wound (abscess): pending  Thank you for allowing pharmacy to be a part of this patient's care.  Vertis Kelch, PharmD PGY1 Pharmacy Resident Phone [phone] 01/29/2018       1:58 PM

## 2018-01-29 NOTE — Plan of Care (Signed)
  Problem: Education: Goal: Knowledge of General Education information will improve Description Including pain rating scale, medication(s)/side effects and non-pharmacologic comfort measures Outcome: Progressing   Problem: Activity: Goal: Risk for activity intolerance will decrease Outcome: Progressing   Problem: Nutrition: Goal: Adequate nutrition will be maintained Outcome: Progressing   Problem: Elimination: Goal: Will not experience complications related to urinary retention Outcome: Progressing   Problem: Safety: Goal: Ability to remain free from injury will improve Outcome: Progressing   Problem: Skin Integrity: Goal: Risk for impaired skin integrity will decrease Outcome: Progressing

## 2018-01-29 NOTE — Anesthesia Preprocedure Evaluation (Addendum)
Anesthesia Evaluation  Patient identified by MRN, date of birth, ID band Patient awake    Reviewed: Allergy & Precautions, H&P , NPO status , Patient's Chart, lab work & pertinent test results  Airway Mallampati: II  TM Distance: >3 FB Neck ROM: Full    Dental no notable dental hx. (+) Teeth Intact, Dental Advisory Given, Partial Upper, Poor Dentition, Chipped, Missing,    Pulmonary asthma , Current Smoker,    Pulmonary exam normal breath sounds clear to auscultation       Cardiovascular hypertension, Pt. on medications  Rhythm:Regular Rate:Normal     Neuro/Psych PSYCHIATRIC DISORDERS Anxiety Depression Schizophrenia CVA, No Residual Symptoms    GI/Hepatic GERD  Medicated,(+)     substance abuse  alcohol use and cocaine use,   Endo/Other  diabetes, Type 2, Oral Hypoglycemic Agents  Renal/GU   negative genitourinary   Musculoskeletal  (+) Arthritis , Osteoarthritis,    Abdominal   Peds  Hematology negative hematology ROS (+) anemia ,   Anesthesia Other Findings   Reproductive/Obstetrics negative OB ROS                            Anesthesia Physical  Anesthesia Plan  ASA: II  Anesthesia Plan: General   Post-op Pain Management:    Induction: Intravenous  PONV Risk Score and Plan: 2 and Ondansetron, Dexamethasone and Treatment may vary due to age or medical condition  Airway Management Planned: LMA  Additional Equipment:   Intra-op Plan:   Post-operative Plan: Extubation in OR  Informed Consent: I have reviewed the patients History and Physical, chart, labs and discussed the procedure including the risks, benefits and alternatives for the proposed anesthesia with the patient or authorized representative who has indicated his/her understanding and acceptance.   Dental advisory given  Plan Discussed with: CRNA, Anesthesiologist and Surgeon  Anesthesia Plan Comments: ( )        Anesthesia Quick Evaluation

## 2018-01-29 NOTE — Progress Notes (Signed)
PROGRESS NOTE    [name]  NUU:[id number] DOB: 13-Aug-1965 DOA: 01/28/2018 PCP: Lovena Neighbours, MD  Outpatient Specialists:   Brief Narrative:  [name] is a 52 y.o. male with medical history significant for hypertension, asthma, CVA, schizophrenia, and posttraumatic arthritis of his left ankle with chronic pain who presented to ED with swelling of his left ankle.  Underwent hardware fixation and fusion on his left ankle on 10/20/2017 by Dr. Lajoyce Corners.  Patient noticed about 3 days ago and increased swelling of his left ankle compared to his right.  It associated pain and fevers.  He was unable to ambulate as well as usual.  He then noticed a boil on his left lateral ankle which at discharge.  His fiance called Dr. Audrie Lia office on 01/26/2018 to notify of the new findings and was directed to come to the emergency department.  In addition to fevers, he has had chills and occasional diaphoresis.  01/29/2018: Input from infectious disease and orthopedic team is appreciated.  Patient's hardware was removed by the orthopedic team earlier today.  Will start patient on IV vancomycin and ceftazidime as advised by the infectious disease team.   Assessment & Plan:   Principal Problem:   Septic arthritis of left ankle (HCC) Active Problems:   Tobacco abuse   Benign essential HTN   Post-traumatic osteoarthritis, left ankle and foot   S/P ankle fusion   GERD (gastroesophageal reflux disease)  Suspected septic arthritis of left ankle: He is status post ankle fusion July 2019.  Orthopedics, Dr. Lajoyce Corners, and infectious disease, Dr. Orvan Falconer are following. -Holding antibiotics until I&D performed for culture data -Start IV vancomycin and ceftazidime after I&D -ESR 94, CRP 12.6 -Follow-up blood cultures -MRI left ankle ordered -I&D plan for 01/29/2018 -N.p.o. at midnight  01/29/18: Patient is status post removal of deep retained hardware left ankle. Will start patient on IV antibiotics (IV  vancomycin and ceftazidime). Input from orthopedics and ID team is highly appreciated.  Hypertension: Blood pressure improving.  Resume home losartan and amlodipine. Continue to optimize.  GERD: Protonix, sucralfate  DVT prophylaxis: SCDs Code Status: Full code Family Communication: No family present Disposition Plan: Pending incision and drainage and return of culture data Consults called: Orthopedics, ID  Procedures:   Removal of deep retained hardware left ankle by orthopedic team.  Antimicrobials:   IV vancomycin 01/29/2018>  IV ceftazidime 01/29/2018>   Subjective: No new complaints. No fever or chills. No chest pain.  Objective: Vitals:   01/29/18 1205 01/29/18 1220 01/29/18 1235 01/29/18 1259  BP: (!) 121/95 117/89 (!) 121/91 114/87  Pulse: (!) 114 (!) 111 (!) 109 (!) 113  Resp: 18 19 14 18   Temp:   97.7 F (36.5 C) 98.3 F (36.8 C)  TempSrc:    Oral  SpO2: 96% 96% 94% 100%  Weight:      Height:        Intake/Output Summary (Last 24 hours) at 01/29/2018 1309 Last data filed at 01/29/2018 1200 Gross per 24 hour  Intake 650 ml  Output 400 ml  Net 250 ml   Filed Weights   01/28/18 1212  Weight: 93.4 kg    Examination:  General exam: Appears calm and comfortable  Respiratory system: Clear to auscultation. Respiratory effort normal. Cardiovascular system: S1 & S2 heard Gastrointestinal system: Abdomen is nondistended, soft and nontender. No organomegaly or masses felt. Normal bowel sounds heard. Central nervous system: Alert and oriented. No focal neurological deficits. Extremities: Left lower leg and ankle  area is dressed.  Psychiatry: Judgement and insight appear normal. Mood & affect appropriate.   Data Reviewed: I have personally reviewed following labs and imaging studies  CBC: Recent Labs  Lab 01/28/18 1245 01/29/18 0305  WBC 8.4 7.0  NEUTROABS 6.7  --   HGB 11.4* 11.0*  HCT 38.3* 35.9*  MCV 85.1 82.3  PLT 529* 522*   Basic  Metabolic Panel: Recent Labs  Lab 01/28/18 1245 01/29/18 0305  NA 137 138  K 3.8 4.2  CL 103 109  CO2 23 21*  GLUCOSE 99 75  BUN 8 11  CREATININE 0.97 1.05  CALCIUM 8.8* 8.7*   GFR: Estimated Creatinine Clearance: 92.9 mL/min (by C-G formula based on SCr of 1.05 mg/dL). Liver Function Tests: Recent Labs  Lab 01/28/18 1245  AST 18  ALT 8  ALKPHOS 99  BILITOT 0.6  PROT 7.3  ALBUMIN 3.4*   No results for input(s): LIPASE, AMYLASE in the last 168 hours. No results for input(s): AMMONIA in the last 168 hours. Coagulation Profile: No results for input(s): INR, PROTIME in the last 168 hours. Cardiac Enzymes: No results for input(s): CKTOTAL, CKMB, CKMBINDEX, TROPONINI in the last 168 hours. BNP (last 3 results) No results for input(s): PROBNP in the last 8760 hours. HbA1C: No results for input(s): HGBA1C in the last 72 hours. CBG: Recent Labs  Lab 01/29/18 1149  GLUCAP 128*   Lipid Profile: No results for input(s): CHOL, HDL, LDLCALC, TRIG, CHOLHDL, LDLDIRECT in the last 72 hours. Thyroid Function Tests: No results for input(s): TSH, T4TOTAL, FREET4, T3FREE, THYROIDAB in the last 72 hours. Anemia Panel: No results for input(s): VITAMINB12, FOLATE, FERRITIN, TIBC, IRON, RETICCTPCT in the last 72 hours. Urine analysis:    Component Value Date/Time   COLORURINE YELLOW 01/28/2018 1715   APPEARANCEUR CLEAR 01/28/2018 1715   LABSPEC 1.008 01/28/2018 1715   PHURINE 7.0 01/28/2018 1715   GLUCOSEU NEGATIVE 01/28/2018 1715   HGBUR NEGATIVE 01/28/2018 1715   BILIRUBINUR NEGATIVE 01/28/2018 1715   BILIRUBINUR negative 04/10/2016 1702   KETONESUR 20 (A) 01/28/2018 1715   PROTEINUR NEGATIVE 01/28/2018 1715   UROBILINOGEN 0.2 04/10/2016 1702   UROBILINOGEN 0.2 12/04/2013 1044   NITRITE NEGATIVE 01/28/2018 1715   LEUKOCYTESUR NEGATIVE 01/28/2018 1715   Sepsis Labs: @LABRCNTIP (procalcitonin:4,lacticidven:4)  ) Recent Results (from the past 240 hour(s))  Wound culture      Status: None (Preliminary result)   Collection Time: 01/27/18 12:57 PM  Result Value Ref Range Status   MICRO NUMBER: [id number]  Preliminary   SPECIMEN QUALITY: ADEQUATE  Preliminary   SOURCE: WOUND (SITE NOT SPECIFIED)  Preliminary   STATUS: PRELIMINARY  Preliminary   GRAM STAIN:   Preliminary    Moderate White blood cells seen No epithelial cells seen No organisms seen   ISOLATE 1: Gram negative bacilli isolated  Preliminary    Comment: Light growth of Gram negative bacilli isolated Identification and susceptibilities to follow.  Surgical PCR screen     Status: None   Collection Time: 01/29/18  9:57 AM  Result Value Ref Range Status   MRSA, PCR NEGATIVE NEGATIVE Final   Staphylococcus aureus NEGATIVE NEGATIVE Final    Comment: (NOTE) The Xpert SA Assay (FDA approved for NASAL specimens in patients 9 years of age and older), is one component of a comprehensive surveillance program. It is not intended to diagnose infection nor to guide or monitor treatment. Performed at [facility], [address], [place], [place] [address]  Radiology Studies: Dg Ankle Complete Left  Result Date: 01/28/2018 CLINICAL DATA:  52 y/o M; pain and swelling of the left ankle. Weeping wounds of the lateral left ankle. EXAM: LEFT ANKLE COMPLETE - 3+ VIEW COMPARISON:  03/04/2017 left ankle radiographs. FINDINGS: Screw fixation of the ankle joint. Faint lucency surrounding screw heads may reflect hardware loosening. Soft tissue swelling surrounding the ankle joint. Bony hypertrophy surrounding the ankle joint. Plantar calcaneal enthesophyte. No acute fracture identified. IMPRESSION: Screw fixation of the ankle joint. Faint lucency surrounding screw heads may reflect hardware loosening. Soft tissue swelling surrounding the ankle joint. No acute fracture identified. Electronically Signed   By: Mitzi Hansen M.D.   On: 01/28/2018 13:35        Scheduled Meds: . amLODipine  2.5  mg Oral Daily  . buPROPion  150 mg Oral Daily  . docusate sodium  100 mg Oral BID  . losartan  50 mg Oral Daily  . mirtazapine  15 mg Oral QHS  . oxyCODONE      . pantoprazole  80 mg Oral Daily  . sucralfate  1 g Oral TID AC & HS   Continuous Infusions: . sodium chloride    . lactated ringers 10 mL/hr at 01/29/18 1028  . methocarbamol (ROBAXIN) IV       LOS: 1 day    Time spent: 25 Minutes.    Berton Mount, MD  [facility] Pager #: [phone] 7PM-7AM contact night coverage as above

## 2018-01-29 NOTE — Consult Note (Signed)
[facility] for Infectious Disease    Date of Admission:  01/28/2018           Day 1 vancomycin        Day 1 ceftazidime       Reason for Consult: Postoperative wound infection of left ankle    Referring Provider: Dr. Meridee Score  Assessment: Suspect that he developed deep infection around his lateral screw.  Continue empiric therapy pending final culture results.  Plan: 1. Continue current antibiotics pending culture results  Principal Problem:   Postoperative wound infection Active Problems:   Post-traumatic osteoarthritis, left ankle and foot   S/P ankle fusion   Tobacco abuse   Benign essential HTN   GERD (gastroesophageal reflux disease)   Scheduled Meds: . amLODipine  2.5 mg Oral Daily  . buPROPion  150 mg Oral Daily  . docusate sodium  100 mg Oral BID  . losartan  50 mg Oral Daily  . mirtazapine  15 mg Oral QHS  . pantoprazole  80 mg Oral Daily  . sucralfate  1 g Oral TID AC & HS   Continuous Infusions: . sodium chloride    . cefTAZidime (FORTAZ)  IV    . lactated ringers 10 mL/hr at 01/29/18 1028  . methocarbamol (ROBAXIN) IV    . vancomycin     Followed by  . vancomycin     PRN Meds:.acetaminophen **OR** acetaminophen, [START ON 01/30/2018] acetaminophen, albuterol, bisacodyl, HYDROmorphone (DILAUDID) injection, ketorolac, magnesium citrate, methocarbamol **OR** methocarbamol (ROBAXIN) IV, metoCLOPramide **OR** metoCLOPramide (REGLAN) injection, morphine injection, ondansetron **OR** ondansetron (ZOFRAN) IV, oxyCODONE, oxyCODONE, polyethylene glycol  HPI: [name] is a 52 y.o. male who had posttraumatic arthritis of his left ankle with chronic pain.  He underwent hardware fixation and fusion on 10/20/2017.  He fell in his bathroom in early August and twisted his left ankle.  He was evaluated on 11/01/2017 and appeared to be healing nicely after surgery.  He was seen again by Dr. Sharol Given on 11/22/2017 and reported fever of 102 degrees.  He said that  he felt like he "messed something up" when he fell.  There was no evidence of infection on exam and x-ray showed stable fusion with no hardware failure. Apparently he fell again around 01/19/2018 and felt a "pop".  He had difficulty with transportation and could not make an immediate follow-up visit.  His fiance called Dr. Jess Barters office on 01/26/2018 and said that [name] had developed a boil on the lateral side of his left ankle.  Dr. Kermit Balo I saw him in the office 2 days ago and was able to probe deeply in the wound.  Specimen was sent for culture.  I spoke with Dr. Sharol Given this morning and apparently there is no growth reported so far.  Mr. Guderian was admitted last night and underwent surgery this morning.  Both screws were removed and tissue was curetted from the screw holes.  No gross purulence was encountered.  No organisms were seen on operative Gram stain.  He has been started on empiric vancomycin and ceftazidime.    Review of Systems: Review of Systems  Constitutional: Positive for chills and malaise/fatigue. Negative for diaphoresis, fever and weight loss.  Gastrointestinal: Negative for abdominal pain, diarrhea, nausea and vomiting.  Musculoskeletal: Positive for joint pain.  Skin: Negative for rash.    Past Medical History:  Diagnosis Date  . Anxiety   . Arthritis   . Asthma   .  Duodenal adenoma   . GERD (gastroesophageal reflux disease)   . Hypertension   . Pneumonia    hx  . Post-traumatic osteoarthritis of left ankle   . Pre-diabetes   . Schizophrenia ([place])   . Stroke [facility])    2009-or10    Social History   Tobacco Use  . Smoking status: Heavy Tobacco Smoker    Packs/day: 1.50    Years: 38.00    Pack years: 57.00    Types: Cigarettes  . Smokeless tobacco: Never Used  . Tobacco comment: onset age 81; upto 1.5 ppd  Substance Use Topics  . Alcohol use: Not Currently    Alcohol/week: 3.0 standard drinks    Types: 3 Cans of beer per week    Comment: quit 2015;  formerly up to 2 fifths/day  . Drug use: No    Family History  Adopted: Yes  Problem Relation Age of Onset  . Colon cancer Neg Hx   . Esophageal cancer Neg Hx   . Rectal cancer Neg Hx   . Stomach cancer Neg Hx    No Known Allergies  OBJECTIVE: Blood pressure 114/87, pulse (!) 113, temperature 98.3 F (36.8 C), temperature source Oral, resp. rate 18, height 5\' 9"  (1.753 m), weight 93.4 kg, SpO2 100 %.  Physical Exam  Constitutional: He is oriented to person, place, and time.  He is pleasant and in no distress sitting up in bed.  Musculoskeletal:  He has a fresh operative dressing on his left ankle.  Neurological: He is alert and oriented to person, place, and time.  Skin: No rash noted.  Psychiatric: He has a normal mood and affect.    Lab Results Lab Results  Component Value Date   WBC 7.0 01/29/2018   HGB 11.0 (L) 01/29/2018   HCT 35.9 (L) 01/29/2018   MCV 82.3 01/29/2018   PLT 522 (H) 01/29/2018    Lab Results  Component Value Date   CREATININE 1.05 01/29/2018   BUN 11 01/29/2018   NA 138 01/29/2018   K 4.2 01/29/2018   CL 109 01/29/2018   CO2 21 (L) 01/29/2018    Lab Results  Component Value Date   ALT 8 01/28/2018   AST 18 01/28/2018   ALKPHOS 99 01/28/2018   BILITOT 0.6 01/28/2018     Microbiology: Recent Results (from the past 240 hour(s))  Wound culture     Status: None (Preliminary result)   Collection Time: 01/27/18 12:57 PM  Result Value Ref Range Status   MICRO NUMBER: [id number]  Preliminary   SPECIMEN QUALITY: ADEQUATE  Preliminary   SOURCE: WOUND (SITE NOT SPECIFIED)  Preliminary   STATUS: PRELIMINARY  Preliminary   GRAM STAIN:   Preliminary    Moderate White blood cells seen No epithelial cells seen No organisms seen   ISOLATE 1: Gram negative bacilli isolated  Preliminary    Comment: Light growth of Gram negative bacilli isolated Identification and susceptibilities to follow.  Surgical PCR screen     Status: None   Collection Time:  01/29/18  9:57 AM  Result Value Ref Range Status   MRSA, PCR NEGATIVE NEGATIVE Final   Staphylococcus aureus NEGATIVE NEGATIVE Final    Comment: (NOTE) The Xpert SA Assay (FDA approved for NASAL specimens in patients 36 years of age and older), is one component of a comprehensive surveillance program. It is not intended to diagnose infection nor to guide or monitor treatment. Performed at [facility], [place] [address] [place], [place] [address]  Aerobic/Anaerobic Culture (surgical/deep wound)     Status: None (Preliminary result)   Collection Time: 01/29/18 11:22 AM  Result Value Ref Range Status   Specimen Description ABSCESS LEFT ANKLE  Final   Special Requests NONE  Final   Gram Stain   Final    FEW WBC PRESENT,BOTH PMN AND MONONUCLEAR NO ORGANISMS SEEN Performed at [facility], [address] [place], [place] [address]    Culture PENDING  Incomplete   Report Status PENDING  Incomplete  Aerobic/Anaerobic Culture (surgical/deep wound)     Status: None (Preliminary result)   Collection Time: 01/29/18 11:33 AM  Result Value Ref Range Status   Specimen Description TISSUE LEFT ANKLE  Final   Special Requests NONE  Final   Gram Stain   Final    MODERATE WBC PRESENT,BOTH PMN AND MONONUCLEAR NO ORGANISMS SEEN Performed at [facility], [address] [place], [place] [address]    Culture PENDING  Incomplete   Report Status PENDING  Incomplete    [name], MD [facility] [phone] pager   [phone] cell 01/29/2018, 2:34 PM

## 2018-01-30 ENCOUNTER — Inpatient Hospital Stay (HOSPITAL_COMMUNITY): Payer: Medicare Other

## 2018-01-30 LAB — WOUND CULTURE
MICRO NUMBER:: 91312112
SPECIMEN QUALITY:: ADEQUATE

## 2018-01-30 MED ORDER — GADOBUTROL 1 MMOL/ML IV SOLN
10.0000 mL | Freq: Once | INTRAVENOUS | Status: AC | PRN
  Administered 2018-01-30: 10 mL via INTRAVENOUS

## 2018-01-30 NOTE — Progress Notes (Signed)
Patient ID: [name], male   DOB: [birth year], 52 y.o.   MRN: [id number]          [facility] for Infectious Disease    Date of Admission:  01/28/2018   Day 2 vancomycin        Day 2 ceftazidime  The operative Gram stain from his left ankle showed no organisms.  Operative cultures are pending.  Dr. Jess Barters note indicates that there are no final results from cultures obtained in his office.  I will continue current antibiotics for now.         [name], MD [facility] for Infectious [place] [facility] [phone] pager   [phone] cell 01/30/2018, 12:11 PM

## 2018-01-30 NOTE — Evaluation (Signed)
Physical Therapy Evaluation Patient Details Name: [name] MRN: [id number] DOB: [birth year] Today's Date: 01/30/2018   History of Present Illness  Admitted with septic ankle, now s/p Removal of deep retained hardware left ankle, WBAT;  has a past medical history of Anxiety, Arthritis, Asthma, Duodenal adenoma, GERD (gastroesophageal reflux disease), Hypertension, Pneumonia, Post-traumatic osteoarthritis of left ankle, Pre-diabetes, Schizophrenia (HCC), and Stroke (HCC).  Clinical Impression   Patient is s/p above surgery resulting in functional limitations due to the deficits listed below (see PT Problem List). Independent prior to admission, using cane/RW prn; Presents with decr functional mobility; Overall using RW well; will consider cane/crutch next session; Patient will benefit from skilled PT to increase their independence and safety with mobility to allow discharge to the venue listed below.       Follow Up Recommendations Outpatient PT(The potential need for Outpatient PT can be addressed at Ortho follow-up appointments. )    Equipment Recommendations  None recommended by PT    Recommendations for Other Services       Precautions / Restrictions Precautions Precautions: None      Mobility  Bed Mobility Overal bed mobility: Independent                Transfers Overall transfer level: Needs assistance Equipment used: Rolling walker (2 wheeled) Transfers: Sit to/from Stand Sit to Stand: Supervision         General transfer comment: Cues for hand placement  Ambulation/Gait Ambulation/Gait assistance: Supervision Gait Distance (Feet): 150 Feet Assistive device: Rolling walker (2 wheeled) Gait Pattern/deviations: Step-through pattern     General Gait Details: Cues for RW use, and to push RW similar to grocery cart; adjusted height for optimal fit; Overall noting good weight acceptance LLE, and used RW appropriately to unwigh painful LLE in  stance  Stairs            Wheelchair Mobility    Modified Rankin (Stroke Patients Only)       Balance                                             Pertinent Vitals/Pain Pain Assessment: 0-10 Pain Score: 8  Pain Location: L ankle Pain Descriptors / Indicators: Aching Pain Intervention(s): Monitored during session    Home Living Family/patient expects to be discharged to:: Private residence Living Arrangements: Spouse/significant other Available Help at Discharge: Family;Available 24 hours/day Type of Home: Apartment Home Access: Stairs to enter   Entrance Stairs-Number of Steps: Apartment level entry, but must navigate up curb in parking lot Home Layout: One level        Prior Function Level of Independence: Independent;Independent with assistive device(s)         Comments: cane prn leading up to admission     Hand Dominance   Dominant Hand: Right    Extremity/Trunk Assessment   Upper Extremity Assessment Upper Extremity Assessment: Overall WFL for tasks assessed    Lower Extremity Assessment Lower Extremity Assessment: Overall WFL for tasks assessed;LLE deficits/detail LLE Deficits / Details: Hip, knee WNL; Bulky dressing on L ankle; decr ankle active ROM postop; positive active toe wiggle, sensation decr to light touch    Cervical / Trunk Assessment Cervical / Trunk Assessment: Normal  Communication   Communication: No difficulties  Cognition Arousal/Alertness: Awake/alert Behavior During Therapy: WFL for tasks assessed/performed Overall Cognitive Status: Within Functional Limits for tasks  assessed                                        General Comments General comments (skin integrity, edema, etc.): session on Room Air, O2 sats 98% post amb, HR 105; we briefly discussed this opportunity to quit smoking; he also indicated he quit drinking a few months ago; provided encouragement    Exercises      Assessment/Plan    PT Assessment Patient needs continued PT services  PT Problem List Decreased range of motion;Decreased activity tolerance;Decreased mobility;Decreased knowledge of use of DME;Pain       PT Treatment Interventions DME instruction;Gait training;Stair training;Functional mobility training;Therapeutic activities;Therapeutic exercise;Balance training;Patient/family education    PT Goals (Current goals can be found in the Care Plan section)  Acute Rehab PT Goals Patient Stated Goal: less pain PT Goal Formulation: With patient Time For Goal Achievement: 02/06/18 Potential to Achieve Goals: Good    Frequency Min 5X/week   Barriers to discharge        Co-evaluation               AM-PAC PT "6 Clicks" Daily Activity  Outcome Measure Difficulty turning over in bed (including adjusting bedclothes, sheets and blankets)?: None Difficulty moving from lying on back to sitting on the side of the bed? : None Difficulty sitting down on and standing up from a chair with arms (e.g., wheelchair, bedside commode, etc,.)?: A Little Help needed moving to and from a bed to chair (including a wheelchair)?: None Help needed walking in hospital room?: None Help needed climbing 3-5 steps with a railing? : A Little 6 Click Score: 22    End of Session   Activity Tolerance: Patient tolerated treatment well Patient left: in chair;with call bell/phone within reach Nurse Communication: Mobility status PT Visit Diagnosis: Other abnormalities of gait and mobility (R26.89);Pain Pain - Right/Left: Left Pain - part of body: Ankle and joints of foot    Time: 0821-0840 PT Time Calculation (min) (ACUTE ONLY): 19 min   Charges:   PT Evaluation $PT Eval Low Complexity: 1 Low          Van Clines, PT  Acute Rehabilitation Services Pager [phone] Office [phone]   Levi Aland 01/30/2018, 8:53 AM

## 2018-01-30 NOTE — Progress Notes (Signed)
PROGRESS NOTE    [name]  YNW:[id number] DOB: 1965/09/22 DOA: 01/28/2018 PCP: Lovena Neighbours, MD  Outpatient Specialists:   Brief Narrative:  [name] is a 52 y.o. male with medical history significant for hypertension, asthma, CVA, schizophrenia, and posttraumatic arthritis of his left ankle with chronic pain who presented to ED with swelling of his left ankle.  Underwent hardware fixation and fusion on his left ankle on 10/20/2017 by Dr. Lajoyce Corners.  Patient noticed about 3 days ago and increased swelling of his left ankle compared to his right.  It associated pain and fevers.  He was unable to ambulate as well as usual.  He then noticed a boil on his left lateral ankle which at discharge.  His fiance called Dr. Audrie Lia office on 01/26/2018 to notify of the new findings and was directed to come to the emergency department.  In addition to fevers, he has had chills and occasional diaphoresis.  01/29/2018: Input from infectious disease and orthopedic team is appreciated.  Patient's hardware was removed by the orthopedic team earlier today.  Will start patient on IV vancomycin and ceftazidime as advised by the infectious disease team.  01/30/2018: Patient seen.  No new changes.  Patient is asking for more pain medication.  We will continue IV antibiotics for now.  The cultures are still pending.   Assessment & Plan:   Principal Problem:   Postoperative wound infection Active Problems:   Tobacco abuse   Benign essential HTN   Post-traumatic osteoarthritis, left ankle and foot   S/P ankle fusion   GERD (gastroesophageal reflux disease)  Suspected septic arthritis of left ankle: He is status post ankle fusion July 2019.  Orthopedics, Dr. Lajoyce Corners, and infectious disease, Dr. Orvan Falconer are following. -Holding antibiotics until I&D performed for culture data -Start IV vancomycin and ceftazidime after I&D -ESR 94, CRP 12.6 -Follow-up blood cultures -MRI left ankle ordered -I&D plan for  01/29/2018 -N.p.o. at midnight  01/29/18: Patient is status post removal of deep retained hardware left ankle. Will start patient on IV antibiotics (IV vancomycin and ceftazidime). Input from orthopedics and ID team is highly appreciated.  01/30/18: We will continue current management.  We will follow final culture results.  Continue antibiotics for now.  Optimize pain control.  Hypertension: Blood pressure improving.  Resume home losartan and amlodipine. Continue to optimize.  GERD: Protonix, sucralfate  DVT prophylaxis: SCDs Code Status: Full code Family Communication: No family present Disposition Plan: Pending incision and drainage and return of culture data Consults called: Orthopedics, ID  Procedures:   Removal of deep retained hardware left ankle by orthopedic team.  Antimicrobials:   IV vancomycin 01/29/2018>  IV ceftazidime 01/29/2018>   Subjective: No new complaints. No fever or chills. No chest pain.  Objective: Vitals:   01/29/18 1259 01/29/18 1930 01/29/18 2321 01/30/18 0358  BP: 114/87 121/79 124/87 120/84  Pulse: (!) 113 (!) 108 96 97  Resp: 18     Temp: 98.3 F (36.8 C) 98 F (36.7 C) 97.9 F (36.6 C) 97.6 F (36.4 C)  TempSrc: Oral Oral Oral Oral  SpO2: 100% 98% 98% 99%  Weight:      Height:        Intake/Output Summary (Last 24 hours) at 01/30/2018 1412 Last data filed at 01/30/2018 0800 Gross per 24 hour  Intake 1867.14 ml  Output 2300 ml  Net -432.86 ml   Filed Weights   01/28/18 1212  Weight: 93.4 kg    Examination:  General exam:  Appears calm and comfortable  Respiratory system: Clear to auscultation. Respiratory effort normal. Cardiovascular system: S1 & S2 heard Gastrointestinal system: Abdomen is nondistended, soft and nontender. No organomegaly or masses felt. Normal bowel sounds heard. Central nervous system: Alert and oriented. No focal neurological deficits. Extremities: Left lower leg and ankle area is dressed.    Psychiatry: Judgement and insight appear normal. Mood & affect appropriate.   Data Reviewed: I have personally reviewed following labs and imaging studies  CBC: Recent Labs  Lab 01/28/18 1245 01/29/18 0305  WBC 8.4 7.0  NEUTROABS 6.7  --   HGB 11.4* 11.0*  HCT 38.3* 35.9*  MCV 85.1 82.3  PLT 529* 522*   Basic Metabolic Panel: Recent Labs  Lab 01/28/18 1245 01/29/18 0305  NA 137 138  K 3.8 4.2  CL 103 109  CO2 23 21*  GLUCOSE 99 75  BUN 8 11  CREATININE 0.97 1.05  CALCIUM 8.8* 8.7*   GFR: Estimated Creatinine Clearance: 92.9 mL/min (by C-G formula based on SCr of 1.05 mg/dL). Liver Function Tests: Recent Labs  Lab 01/28/18 1245  AST 18  ALT 8  ALKPHOS 99  BILITOT 0.6  PROT 7.3  ALBUMIN 3.4*   No results for input(s): LIPASE, AMYLASE in the last 168 hours. No results for input(s): AMMONIA in the last 168 hours. Coagulation Profile: No results for input(s): INR, PROTIME in the last 168 hours. Cardiac Enzymes: No results for input(s): CKTOTAL, CKMB, CKMBINDEX, TROPONINI in the last 168 hours. BNP (last 3 results) No results for input(s): PROBNP in the last 8760 hours. HbA1C: No results for input(s): HGBA1C in the last 72 hours. CBG: Recent Labs  Lab 01/29/18 1149  GLUCAP 128*   Lipid Profile: No results for input(s): CHOL, HDL, LDLCALC, TRIG, CHOLHDL, LDLDIRECT in the last 72 hours. Thyroid Function Tests: No results for input(s): TSH, T4TOTAL, FREET4, T3FREE, THYROIDAB in the last 72 hours. Anemia Panel: No results for input(s): VITAMINB12, FOLATE, FERRITIN, TIBC, IRON, RETICCTPCT in the last 72 hours. Urine analysis:    Component Value Date/Time   COLORURINE YELLOW 01/28/2018 1715   APPEARANCEUR CLEAR 01/28/2018 1715   LABSPEC 1.008 01/28/2018 1715   PHURINE 7.0 01/28/2018 1715   GLUCOSEU NEGATIVE 01/28/2018 1715   HGBUR NEGATIVE 01/28/2018 1715   BILIRUBINUR NEGATIVE 01/28/2018 1715   BILIRUBINUR negative 04/10/2016 1702   KETONESUR 20 (A)  01/28/2018 1715   PROTEINUR NEGATIVE 01/28/2018 1715   UROBILINOGEN 0.2 04/10/2016 1702   UROBILINOGEN 0.2 12/04/2013 1044   NITRITE NEGATIVE 01/28/2018 1715   LEUKOCYTESUR NEGATIVE 01/28/2018 1715   Sepsis Labs: @LABRCNTIP (procalcitonin:4,lacticidven:4)  ) Recent Results (from the past 240 hour(s))  Wound culture     Status: Abnormal   Collection Time: 01/27/18 12:57 PM  Result Value Ref Range Status   MICRO NUMBER: [id number]  Final   SPECIMEN QUALITY: ADEQUATE  Final   SOURCE: WOUND (SITE NOT SPECIFIED)  Final   STATUS: FINAL  Final   GRAM STAIN:   Final    Moderate White blood cells seen No epithelial cells seen No organisms seen   ISOLATE 1: Enterobacter cloacae complex (A)  Final    Comment: Light growth of Enterobacter cloacae complex      Susceptibility   Enterobacter cloacae complex - AEROBIC CULT, GRAM STAIN NEGATIVE 1    AMOX/CLAVULANIC >=32 Resistant     CEFAZOLIN* >=64 Resistant      * For infections other than uncomplicated UTIcaused by E. coli, K. pneumoniae or P. mirabilis:Cefazolin is resistant if  MIC > or = 8 mcg/mL.(Distinguishing susceptible versus intermediatefor isolates with MIC < or = 4 mcg/mL requiresadditional testing.)    CEFEPIME <=1 Sensitive     CEFTRIAXONE <=1 Sensitive     CIPROFLOXACIN <=0.25 Sensitive     LEVOFLOXACIN <=0.12 Sensitive     ERTAPENEM <=0.5 Sensitive     GENTAMICIN <=1 Sensitive     IMIPENEM 0.5 Sensitive     PIP/TAZO <=4 Sensitive     TOBRAMYCIN <=1 Sensitive     TRIMETH/SULFA* <=20 Sensitive      * For infections other than uncomplicated UTIcaused by E. coli, K. pneumoniae or P. mirabilis:Cefazolin is resistant if MIC > or = 8 mcg/mL.(Distinguishing susceptible versus intermediatefor isolates with MIC < or = 4 mcg/mL requiresadditional testing.)Legend:S = Susceptible  I = IntermediateR = Resistant  NS = Not susceptible* = Not tested  NR = Not reported**NN = See antimicrobic comments  Culture, blood (routine x 2)     Status: None  (Preliminary result)   Collection Time: 01/28/18  8:45 PM  Result Value Ref Range Status   Specimen Description BLOOD RIGHT ANTECUBITAL  Final   Special Requests   Final    BOTTLES DRAWN AEROBIC AND ANAEROBIC Blood Culture adequate volume   Culture   Final    NO GROWTH < 24 HOURS Performed at [facility], [address], [place], [place] [address]    Report Status PENDING  Incomplete  Culture, blood (routine x 2)     Status: None (Preliminary result)   Collection Time: 01/28/18  8:45 PM  Result Value Ref Range Status   Specimen Description BLOOD RIGHT ANTECUBITAL  Final   Special Requests   Final    BOTTLES DRAWN AEROBIC AND ANAEROBIC Blood Culture results may not be optimal due to an excessive volume of blood received in culture bottles   Culture   Final    NO GROWTH < 24 HOURS Performed at [facility], [address], [place], [place] [address]    Report Status PENDING  Incomplete  Surgical PCR screen     Status: None   Collection Time: 01/29/18  9:57 AM  Result Value Ref Range Status   MRSA, PCR NEGATIVE NEGATIVE Final   Staphylococcus aureus NEGATIVE NEGATIVE Final    Comment: (NOTE) The Xpert SA Assay (FDA approved for NASAL specimens in patients 17 years of age and older), is one component of a comprehensive surveillance program. It is not intended to diagnose infection nor to guide or monitor treatment. Performed at [facility], [address], [place], [place] [address]   Aerobic/Anaerobic Culture (surgical/deep wound)     Status: None (Preliminary result)   Collection Time: 01/29/18 11:22 AM  Result Value Ref Range Status   Specimen Description ABSCESS LEFT ANKLE  Final   Special Requests NONE  Final   Gram Stain   Final    FEW WBC PRESENT,BOTH PMN AND MONONUCLEAR NO ORGANISMS SEEN    Culture   Final    TOO YOUNG TO READ Performed at [facility], [address], [place], [place] [address]    Report Status PENDING  Incomplete    Aerobic/Anaerobic Culture (surgical/deep wound)     Status: None (Preliminary result)   Collection Time: 01/29/18 11:33 AM  Result Value Ref Range Status   Specimen Description TISSUE LEFT ANKLE  Final   Special Requests NONE  Final   Gram Stain   Final    MODERATE WBC PRESENT,BOTH  PMN AND MONONUCLEAR NO ORGANISMS SEEN    Culture   Final    TOO YOUNG TO READ Performed at [facility], [address], [place], [place] [address]    Report Status PENDING  Incomplete         Radiology Studies: Mr Ankle Left W Wo Contrast  Result Date: 01/30/2018 CLINICAL DATA:  Follow-up osteomyelitis. Patient had surgery to remove ankle screws. EXAM: MRI OF THE LEFT ANKLE WITHOUT AND WITH CONTRAST TECHNIQUE: Multiplanar, multisequence MR imaging of the ankle was performed before and after the administration of intravenous contrast. CONTRAST:  10 mL Gadavist COMPARISON:  Ankle x-ray 01/28/2018 FINDINGS: Bones/Joint/Cartilage Prior tibiotalar arthrodesis with recent removal of the fixation screws. No significant osseous bridging across the joint space. Severe articular surface irregularity and severe marrow edema in the distal tibia and talus with avid enhancement on post-contrast imaging. Small amount of fluid in the posterior tibiotalar joint. Moderate amount of fluid along the anterior aspect of the tibiotalar joint. Mild marrow edema in the distal fibula which may be reactive versus less likely infectious. Subcortical cystic changes in the distal fibula adjacent to the lateral talus. Osseous bridging across the distal tibiofibular syndesmosis. No acute fracture or dislocation. Mild osteoarthritis of the talonavicular joint. Normal alignment. No joint effusion. Ligaments Collateral ligaments are intact.  Lisfranc ligament is intact. Muscles and Tendons Mild tendinosis of the posterior tibial tendon. Mild tenosynovitis of the peroneus longus. Remainder of the flexor, extensor and peroneal tendons are  intact. Large high-grade partial-thickness tear of the medial half of the plantar fascia insertion. No muscle atrophy. Generalized T2 hyperintense signal throughout the plantar musculature likely neurogenic. Soft tissue No fluid collection or hematoma. No soft tissue mass. Mild surrounding soft tissue edema. IMPRESSION: IMPRESSION 1. Prior tibiotalar arthrodesis with recent removal of the fixation screws. No significant osseous bridging across the joint space. Severe articular surface irregularity and severe marrow edema in the distal tibia and talus with avid enhancement on post-contrast imaging. This would be an expected appearance for recent arthrodesis, but superimposed infection can also have a similar appearance. It is difficult to delineate between the 2 etiologies on imaging. Moderate amount of fluid along the anterior aspect of the tibiotalar joint which is amenable for percutaneous aspiration. Electronically Signed   By: Elige Ko   On: 01/30/2018 10:33        Scheduled Meds: . amLODipine  2.5 mg Oral Daily  . buPROPion  150 mg Oral Daily  . docusate sodium  100 mg Oral BID  . losartan  50 mg Oral Daily  . mirtazapine  15 mg Oral QHS  . pantoprazole  80 mg Oral Daily  . sucralfate  1 g Oral TID AC & HS   Continuous Infusions: . sodium chloride 10 mL/hr at 01/29/18 1551  . cefTAZidime (FORTAZ)  IV 1 g (01/30/18 1404)  . lactated ringers 10 mL/hr at 01/29/18 1543  . methocarbamol (ROBAXIN) IV    . vancomycin 1,000 mg (01/30/18 0641)     LOS: 2 days    Time spent: 25 Minutes.    Berton Mount, MD  [facility] Pager #: [phone] 7PM-7AM contact night coverage as above

## 2018-01-30 NOTE — Progress Notes (Addendum)
Patient ID: [name], male   DOB: [birth year], 52 y.o.   MRN: [id number] Patient without complaints this morning.  Cultures pending from Saturday.  Cultures from the office positive for enterobacter. MRI reviewed, fluid anterior to the ankle either due to the fibrous union or secondary to infection, would see how patient responds to hardware removal and IV antibiotics, patient has increased risks with revision fusion.

## 2018-01-31 ENCOUNTER — Encounter (INDEPENDENT_AMBULATORY_CARE_PROVIDER_SITE_OTHER): Payer: Self-pay | Admitting: Orthopedic Surgery

## 2018-01-31 ENCOUNTER — Telehealth (INDEPENDENT_AMBULATORY_CARE_PROVIDER_SITE_OTHER): Payer: Self-pay | Admitting: Orthopedic Surgery

## 2018-01-31 ENCOUNTER — Other Ambulatory Visit: Payer: Self-pay | Admitting: Family Medicine

## 2018-01-31 DIAGNOSIS — B952 Enterococcus as the cause of diseases classified elsewhere: Secondary | ICD-10-CM

## 2018-01-31 DIAGNOSIS — T847XXA Infection and inflammatory reaction due to other internal orthopedic prosthetic devices, implants and grafts, initial encounter: Secondary | ICD-10-CM | POA: Insufficient documentation

## 2018-01-31 DIAGNOSIS — T8149XD Infection following a procedure, other surgical site, subsequent encounter: Secondary | ICD-10-CM

## 2018-01-31 DIAGNOSIS — Z1624 Resistance to multiple antibiotics: Secondary | ICD-10-CM

## 2018-01-31 MED ORDER — AMLODIPINE BESYLATE 2.5 MG PO TABS: 2.5000 mg | ORAL_TABLET | Freq: Every day | ORAL | 0 refills | Status: DC

## 2018-01-31 MED ORDER — SULFAMETHOXAZOLE-TRIMETHOPRIM 800-160 MG PO TABS
2.0000 | ORAL_TABLET | Freq: Two times a day (BID) | ORAL | Status: DC
  Administered 2018-01-31: 2 via ORAL
  Filled 2018-01-31: qty 2

## 2018-01-31 MED ORDER — DOCUSATE SODIUM 100 MG PO CAPS: 100.0000 mg | ORAL_CAPSULE | Freq: Two times a day (BID) | ORAL | 0 refills | Status: DC

## 2018-01-31 MED ORDER — OXYCODONE HCL 5 MG PO TABS: 5.0000 mg | ORAL_TABLET | ORAL | 0 refills | Status: DC | PRN

## 2018-01-31 MED ORDER — BISACODYL 10 MG RE SUPP: 10.0000 mg | Freq: Every day | RECTAL | 0 refills | Status: DC | PRN

## 2018-01-31 MED ORDER — POLYETHYLENE GLYCOL 3350 17 G PO PACK: 17.0000 g | PACK | Freq: Every day | ORAL | 0 refills | Status: DC | PRN

## 2018-01-31 MED ORDER — SULFAMETHOXAZOLE-TRIMETHOPRIM 800-160 MG PO TABS: 2.0000 | ORAL_TABLET | Freq: Two times a day (BID) | ORAL | 0 refills | Status: DC

## 2018-01-31 MED FILL — AMLODIPINE BESYLATE 2.5 MG: 2.5 | 30 days supply | Qty: 30 | Fill #0

## 2018-01-31 MED FILL — SULFAMETHOXAZOLE-TMP DS TAB: 800-160 | 25 days supply | Qty: 100 | Fill #0

## 2018-01-31 MED FILL — oxyCODONE HCL 5 MG TABS: 5 | 5 days supply | Qty: 30 | Fill #0

## 2018-01-31 NOTE — Discharge Instructions (Signed)
Keep left ankle dressing until you see Dr Sharol Given.

## 2018-01-31 NOTE — Progress Notes (Addendum)
Lab called, tissue culture ( left ankle) result rare gm negative rods. Dr Linus Salmons notified.  1425 Left ankle dressing changed, incision with sutures intact, minimal serous drainage noted.  Placed 4X4 and ace wrap. Discharge instructions given to pt, verbalized understanding. Prescription meds was filled by [facility], delivered to pt. Discharged to home transported by a cab.

## 2018-01-31 NOTE — Care Management Important Message (Signed)
Important Message  Patient Details  Name: [name] MRN: [id number] Date of Birth: [birth year]   Medicare Important Message Given:  Yes    Erenest Rasher, RN 01/31/2018, 11:14 AM

## 2018-01-31 NOTE — Progress Notes (Signed)
Patient ID: [name], male   DOB: [birth year], 52 y.o.   MRN: [id number] Patient ambulating in the room this morning without pain with weightbearing.  Dressing is clean and dry.  Cultures from the office showed Enterobacter from Thursday.  MRI is reviewed which shows fibrous union and fluid anteriorly, would like to see how patient progresses with IV antibiotics, he has increased risk with revision fusion.

## 2018-01-31 NOTE — Telephone Encounter (Signed)
D/c pending ID determining what IV antibiotics to use, possibly d/c tomorrow

## 2018-01-31 NOTE — Progress Notes (Signed)
[facility] for Infectious Disease   Reason for visit: Follow up on wound infection  Interval History: culture from office noted as Enterobacter resistant to amox/clav and cefazolin.  Patient with no complaints.  Feels well.  No associated rash or fever.     Physical Exam: Constitutional:  Vitals:   01/30/18 2014 01/31/18 0254  BP: (!) 126/92 (!) 118/94  Pulse: 98 (!) 101  Resp:    Temp: 98 F (36.7 C) 97.7 F (36.5 C)  SpO2: 98% 98%   patient appears in NAD Eyes: anicteric Respiratory: Normal respiratory effort; CTA B Cardiovascular: RRR GI: soft, nt, nd  Review of Systems: Constitutional: negative for fevers, chills and anorexia Gastrointestinal: negative for nausea and diarrhea Integument/breast: negative for rash  Lab Results  Component Value Date   WBC 7.0 01/29/2018   HGB 11.0 (L) 01/29/2018   HCT 35.9 (L) 01/29/2018   MCV 82.3 01/29/2018   PLT 522 (H) 01/29/2018    Lab Results  Component Value Date   CREATININE 1.05 01/29/2018   BUN 11 01/29/2018   NA 138 01/29/2018   K 4.2 01/29/2018   CL 109 01/29/2018   CO2 21 (L) 01/29/2018    Lab Results  Component Value Date   ALT 8 01/28/2018   AST 18 01/28/2018   ALKPHOS 99 01/28/2018     Microbiology: Recent Results (from the past 240 hour(s))  Wound culture     Status: Abnormal   Collection Time: 01/27/18 12:57 PM  Result Value Ref Range Status   MICRO NUMBER: [id number]  Final   SPECIMEN QUALITY: ADEQUATE  Final   SOURCE: WOUND (SITE NOT SPECIFIED)  Final   STATUS: FINAL  Final   GRAM STAIN:   Final    Moderate White blood cells seen No epithelial cells seen No organisms seen   ISOLATE 1: Enterobacter cloacae complex (A)  Final    Comment: Light growth of Enterobacter cloacae complex      Susceptibility   Enterobacter cloacae complex - AEROBIC CULT, GRAM STAIN NEGATIVE 1    AMOX/CLAVULANIC >=32 Resistant     CEFAZOLIN* >=64 Resistant      * For infections other than uncomplicated UTIcaused  by E. coli, K. pneumoniae or P. mirabilis:Cefazolin is resistant if MIC > or = 8 mcg/mL.(Distinguishing susceptible versus intermediatefor isolates with MIC < or = 4 mcg/mL requiresadditional testing.)    CEFEPIME <=1 Sensitive     CEFTRIAXONE <=1 Sensitive     CIPROFLOXACIN <=0.25 Sensitive     LEVOFLOXACIN <=0.12 Sensitive     ERTAPENEM <=0.5 Sensitive     GENTAMICIN <=1 Sensitive     IMIPENEM 0.5 Sensitive     PIP/TAZO <=4 Sensitive     TOBRAMYCIN <=1 Sensitive     TRIMETH/SULFA* <=20 Sensitive      * For infections other than uncomplicated UTIcaused by E. coli, K. pneumoniae or P. mirabilis:Cefazolin is resistant if MIC > or = 8 mcg/mL.(Distinguishing susceptible versus intermediatefor isolates with MIC < or = 4 mcg/mL requiresadditional testing.)Legend:S = Susceptible  I = IntermediateR = Resistant  NS = Not susceptible* = Not tested  NR = Not reported**NN = See antimicrobic comments  Culture, blood (routine x 2)     Status: None (Preliminary result)   Collection Time: 01/28/18  8:45 PM  Result Value Ref Range Status   Specimen Description BLOOD RIGHT ANTECUBITAL  Final   Special Requests   Final    BOTTLES DRAWN AEROBIC AND ANAEROBIC Blood Culture adequate volume  Culture   Final    NO GROWTH 2 DAYS Performed at [facility], [place] [address], [place], [place] [address]    Report Status PENDING  Incomplete  Culture, blood (routine x 2)     Status: None (Preliminary result)   Collection Time: 01/28/18  8:45 PM  Result Value Ref Range Status   Specimen Description BLOOD RIGHT ANTECUBITAL  Final   Special Requests   Final    BOTTLES DRAWN AEROBIC AND ANAEROBIC Blood Culture results may not be optimal due to an excessive volume of blood received in culture bottles   Culture   Final    NO GROWTH 2 DAYS Performed at [facility], [place] [address], [place], [place] [address]    Report Status PENDING  Incomplete  Surgical PCR screen     Status: None   Collection Time:  01/29/18  9:57 AM  Result Value Ref Range Status   MRSA, PCR NEGATIVE NEGATIVE Final   Staphylococcus aureus NEGATIVE NEGATIVE Final    Comment: (NOTE) The Xpert SA Assay (FDA approved for NASAL specimens in patients 76 years of age and older), is one component of a comprehensive surveillance program. It is not intended to diagnose infection nor to guide or monitor treatment. Performed at [facility], [place] [address] [place], [place] [address]   Aerobic/Anaerobic Culture (surgical/deep wound)     Status: None (Preliminary result)   Collection Time: 01/29/18 11:22 AM  Result Value Ref Range Status   Specimen Description ABSCESS LEFT ANKLE  Final   Special Requests NONE  Final   Gram Stain   Final    FEW WBC PRESENT,BOTH PMN AND MONONUCLEAR NO ORGANISMS SEEN    Culture   Final    TOO YOUNG TO READ Performed at [facility], [place] [address] [place], [place] [address]    Report Status PENDING  Incomplete  Aerobic/Anaerobic Culture (surgical/deep wound)     Status: None (Preliminary result)   Collection Time: 01/29/18 11:33 AM  Result Value Ref Range Status   Specimen Description TISSUE LEFT ANKLE  Final   Special Requests NONE  Final   Gram Stain   Final    MODERATE WBC PRESENT,BOTH PMN AND MONONUCLEAR NO ORGANISMS SEEN    Culture   Final    TOO YOUNG TO READ Performed at [facility], [place] [address], [place], [place] [address]    Report Status PENDING  Incomplete    Impression/Plan:  1. Deep infection post op with removal of hardware/screws.  Some WBCs on gram stain.  Office culture with Enterobacter.  OR culture with ngtd.   On empiric therapy.  Based on culture, will use Bactrim 2 DS twice a day which has good bioavailability for presumed deep/bone infection.   Will plan for 4-6 weeks through at least 11/29  2.  Medication monitoring - creat good now.  Will monitor weekly.  3.  Transition of care - will get his Bactrim filled and to him prior to  discharge.  Will arrange follow up in our clinic in 1 week to monitor BMP.  Pt agreeable.

## 2018-01-31 NOTE — Anesthesia Postprocedure Evaluation (Signed)
Anesthesia Post Note  Patient: [name]  Procedure(s) Performed: HARDWARE REMOVAL LEFT ANKLE (Left Ankle)     Patient location during evaluation: PACU Anesthesia Type: General Level of consciousness: sedated and patient cooperative Pain management: pain level controlled Vital Signs Assessment: post-procedure vital signs reviewed and stable Respiratory status: spontaneous breathing Cardiovascular status: stable Anesthetic complications: no    Last Vitals:  Vitals:   01/30/18 2014 01/31/18 0254  BP: (!) 126/92 (!) 118/94  Pulse: 98 (!) 101  Resp:    Temp: 36.7 C 36.5 C  SpO2: 98% 98%    Last Pain:  Vitals:   01/31/18 0455  TempSrc:   PainSc: [place]

## 2018-01-31 NOTE — Progress Notes (Signed)
Physical Therapy Treatment Patient Details Name: [name] MRN: [id number] DOB: November 13, 1965 Today's Date: 01/31/2018    History of Present Illness Admitted with septic ankle, now s/p Removal of deep retained hardware left ankle, WBAT;  has a past medical history of Anxiety, Arthritis, Asthma, Duodenal adenoma, GERD (gastroesophageal reflux disease), Hypertension, Pneumonia, Post-traumatic osteoarthritis of left ankle, Pre-diabetes, Schizophrenia (HCC), and Stroke (HCC).    PT Comments    Continuing work on functional mobility and activity tolerance;  Making excellent progress, able to progress to using single crutch with amb; next session to go over stairs with crutch  Follow Up Recommendations  Outpatient PT(The potential need for Outpatient PT can be addressed at Ortho follow-up appointments. )     Equipment Recommendations  Crutches   Recommendations for Other Services Other (comment)(SW for resources to support sobriety)     Precautions / Restrictions Precautions Precautions: None    Mobility  Bed Mobility Overal bed mobility: Independent                Transfers   Equipment used: None Transfers: Sit to/from Stand Sit to Stand: Supervision         General transfer comment: good rise, supervision for safety  Ambulation/Gait Ambulation/Gait assistance: Supervision Gait Distance (Feet): 300 Feet(greater than) Assistive device: Crutches(single crutch under L arm) Gait Pattern/deviations: Step-through pattern Gait velocity: slowed   General Gait Details: Cues for single crutch use; mildly unsteady, but no gross loss of balance   Stairs Stairs: Yes Stairs assistance: Supervision Stair Management: Two rails;Step to pattern;Forwards Number of Stairs: 5 General stair comments: Cues for sequence, no difficulty noted   Wheelchair Mobility    Modified Rankin (Stroke Patients Only)       Balance                                             Cognition Arousal/Alertness: Awake/alert Behavior During Therapy: WFL for tasks assessed/performed Overall Cognitive Status: Within Functional Limits for tasks assessed                                        Exercises      General Comments        Pertinent Vitals/Pain Pain Assessment: Faces Faces Pain Scale: Hurts little more Pain Location: L ankle Pain Descriptors / Indicators: Aching Pain Intervention(s): Monitored during session    Home Living                      Prior Function            PT Goals (current goals can now be found in the care plan section) Acute Rehab PT Goals Patient Stated Goal: less pain PT Goal Formulation: With patient Time For Goal Achievement: 02/06/18 Potential to Achieve Goals: Good Progress towards PT goals: Progressing toward goals    Frequency    Min 5X/week      PT Plan Current plan remains appropriate    Co-evaluation              AM-PAC PT "6 Clicks" Daily Activity  Outcome Measure  Difficulty turning over in bed (including adjusting bedclothes, sheets and blankets)?: None Difficulty moving from lying on back to sitting on the side of the bed? : None Difficulty  sitting down on and standing up from a chair with arms (e.g., wheelchair, bedside commode, etc,.)?: None Help needed moving to and from a bed to chair (including a wheelchair)?: None Help needed walking in hospital room?: None Help needed climbing 3-5 steps with a railing? : None 6 Click Score: 24    End of Session   Activity Tolerance: Patient tolerated treatment well Patient left: in bed;with call bell/phone within reach Nurse Communication: Mobility status PT Visit Diagnosis: Other abnormalities of gait and mobility (R26.89);Pain Pain - Right/Left: Left Pain - part of body: Ankle and joints of foot     Time: 1155-1209 PT Time Calculation (min) (ACUTE ONLY): 14 min  Charges:  $Gait Training: 8-22 mins                      Van Clines, PT  Acute Rehabilitation Services Pager [phone] Office [phone]    Levi Aland 01/31/2018, 1:28 PM

## 2018-01-31 NOTE — Care Management Note (Signed)
Case Management Note  Patient Details  Name: DRAGO HAMMONDS MRN: [id number] Date of Birth: 1965-10-21  Subjective/Objective:    Septic ankle, s/p removal of deep retained hardware left ankle                Action/Plan: NCM spoke to pt and offered choice for [facility]. Pt agreeable to [facility]. Per attending, no HH needed for dressing changes or IV abx. Will dc home on oral abx. He has RW, cane at home. Contacted Ortho Tech for crutches for home. Lives at home with girlfriend. Has Medicaid transportation.    Notified [facility] of no HH needed.    Expected Discharge Date:  01/31/18               Expected Discharge Plan:  Home/Self Care  In-House Referral:  NA  Discharge planning Services  CM Consult  Post Acute Care Choice:  Home Health Choice offered to:  Patient  DME Arranged:  N/A DME Agency:  NA  HH Arranged:  NA HH Agency:  NA  Status of Service:  Completed, signed off  If discussed at [place] of Stay Meetings, dates discussed:    Additional Comments:  Erenest Rasher, RN 01/31/2018, 2:46 PM

## 2018-01-31 NOTE — Progress Notes (Signed)
Orthopedic Tech Progress Note Patient Details:  [name] Jun 27, 1965 [id number]  Ortho Devices Type of Ortho Device: Crutches Ortho Device/Splint Interventions: Application   Post Interventions Patient Tolerated: Well Instructions Provided: Care of device   Melony Overly T 01/31/2018, 2:58 PM

## 2018-01-31 NOTE — Telephone Encounter (Signed)
Is this pt set to d/c from the hospital today? If so is he going to be on oral ABX?

## 2018-01-31 NOTE — Telephone Encounter (Signed)
[name], patients fiance is wondering if someone can give her a call to update her about patient - she was told he would be discharged today but doesn't know how this is possible since he is still receiving IV antibiotics. # [phone]

## 2018-01-31 NOTE — Telephone Encounter (Signed)
I called and sw [name] to advise of message below. Pt has an appt for follow up Thursday 02/10/18 at 1:45

## 2018-01-31 NOTE — Discharge Summary (Signed)
Physician Discharge Summary  Patient ID: [name] MRN: [id number] DOB/AGE: [birth year] 52 y.o.  Admit date: 01/28/2018 Discharge date: 01/31/2018  Admission Diagnoses:  Discharge Diagnoses:  Principal Problem:   Postoperative wound infection Active Problems:   Tobacco abuse   Benign essential HTN   Post-traumatic osteoarthritis, left ankle and foot   S/P ankle fusion   GERD (gastroesophageal reflux disease)   Discharged Condition: stable  Hospital Course:  Brian Alarie Tibbsis a 52 year old male, with past medical history significant for hypertension, asthma, CVA, schizophrenia, and posttraumatic arthritis of his left ankle with chronic pain.  Patient presented to ED with swelling of his left ankle.  Apparently, patient underwent hardware fixation and fusion on his left ankle on 10/20/2017 by Dr. Sharol Given.  According to the patient, he noticed iincreased swelling of his left ankle compared to his right 30 days prior to presentation, with associated pain and fevers. Patient was unable to ambulate as well as usual. Subsequently, patient noticed a boil on his left lateral ankle. Patient's fiance called the Orthopedic Surgeon's office on 01/26/2018 to notify them of the new findings and patient was directed to come to the emergency department.  Patient was admitted for further assessment and management.  The left ankle hardware was removed by the orthopedic team on 01/29/2018.    Sample from the wound was sent for culture and sensitivity.  Preliminary culture result has revealed rare gram-negative rods, pending final culture results are still pending.  Patient was started on IV vancomycin and ceftazidime after removal of the hardware.  Infectious disease team was consulted to assist with antibiotics management.  Infectious disease team has recommended discharging patient on 4 to 6 weeks course of Bactrim.  Patient will follow-up with the infectious disease team in a week so that labs can be  monitored.  Patient will also follow with the orthopedic team and the primary care provider.  Suspected septic arthritis of left ankle: Patient is status post ankle fusion July 2019.  Orthopedics, Dr. Sharol Given, and infectious disease,Dr. Megan Salon assisted in directing patient's care.   Antibiotics were held until cultures were obtained and sent for culture and sensitivity.   IV vancomycin and ceftazidime will started after I&D was done. Work-up done revealed ESR 94, CRP 12.6 Follow-up blood cultures are still pending MRI left ankle result is as documented below.    Hypertension: Blood pressure was monitored and optimized during the hospital stay.  Home medications, losartan and amlodipine were continued during the hospital stay.  GERD: Protonix Sucralfate  Consults: ID and orthopedic surgery  Significant Diagnostic Studies:  Wound culture is growing right gram-negative rods.  Final culture results are still pending.  MRI of the left ankle done on 01/30/2018 with and without contrast revealed "Prior tibiotalar arthrodesis with recent removal of the fixation screws. No significant osseous bridging across the joint space.  Severe articular surface irregularity and severe marrow edema in the distal tibia and talus with avid enhancement on post-contrast imaging. This would be an expected appearance for recent arthrodesis, but superimposed infection can also have a similar appearance. It is difficult to delineate between the 2 etiologies on imaging. Moderate amount of fluid along the anterior aspect of the tibiotalar joint which is amenable for percutaneous aspiration".  Discharge Exam: Blood pressure (!) 118/94, pulse (!) 101, temperature 97.7 F (36.5 C), temperature source Oral, resp. rate 18, height 5' 9" (1.753 m), weight 93.4 kg, SpO2 98 %.   Disposition: Discharge disposition: 01-Home or Self Care  Physician Discharge Summary  Patient ID: [name] MRN: [id number] DOB/AGE: [birth year] 52 y.o.  Admit date: 01/28/2018 Discharge date: 01/31/2018  Admission Diagnoses:  Discharge Diagnoses:  Principal Problem:   Postoperative wound infection Active Problems:   Tobacco abuse   Benign essential HTN   Post-traumatic osteoarthritis, left ankle and foot   S/P ankle fusion   GERD (gastroesophageal reflux disease)   Discharged Condition: stable  Hospital Course:  Brian Alarie Tibbsis a 52 year old male, with past medical history significant for hypertension, asthma, CVA, schizophrenia, and posttraumatic arthritis of his left ankle with chronic pain.  Patient presented to ED with swelling of his left ankle.  Apparently, patient underwent hardware fixation and fusion on his left ankle on 10/20/2017 by Dr. Sharol Given.  According to the patient, he noticed iincreased swelling of his left ankle compared to his right 30 days prior to presentation, with associated pain and fevers. Patient was unable to ambulate as well as usual. Subsequently, patient noticed a boil on his left lateral ankle. Patient's fiance called the Orthopedic Surgeon's office on 01/26/2018 to notify them of the new findings and patient was directed to come to the emergency department.  Patient was admitted for further assessment and management.  The left ankle hardware was removed by the orthopedic team on 01/29/2018.    Sample from the wound was sent for culture and sensitivity.  Preliminary culture result has revealed rare gram-negative rods, pending final culture results are still pending.  Patient was started on IV vancomycin and ceftazidime after removal of the hardware.  Infectious disease team was consulted to assist with antibiotics management.  Infectious disease team has recommended discharging patient on 4 to 6 weeks course of Bactrim.  Patient will follow-up with the infectious disease team in a week so that labs can be  monitored.  Patient will also follow with the orthopedic team and the primary care provider.  Suspected septic arthritis of left ankle: Patient is status post ankle fusion July 2019.  Orthopedics, Dr. Sharol Given, and infectious disease,Dr. Megan Salon assisted in directing patient's care.   Antibiotics were held until cultures were obtained and sent for culture and sensitivity.   IV vancomycin and ceftazidime will started after I&D was done. Work-up done revealed ESR 94, CRP 12.6 Follow-up blood cultures are still pending MRI left ankle result is as documented below.    Hypertension: Blood pressure was monitored and optimized during the hospital stay.  Home medications, losartan and amlodipine were continued during the hospital stay.  GERD: Protonix Sucralfate  Consults: ID and orthopedic surgery  Significant Diagnostic Studies:  Wound culture is growing right gram-negative rods.  Final culture results are still pending.  MRI of the left ankle done on 01/30/2018 with and without contrast revealed "Prior tibiotalar arthrodesis with recent removal of the fixation screws. No significant osseous bridging across the joint space.  Severe articular surface irregularity and severe marrow edema in the distal tibia and talus with avid enhancement on post-contrast imaging. This would be an expected appearance for recent arthrodesis, but superimposed infection can also have a similar appearance. It is difficult to delineate between the 2 etiologies on imaging. Moderate amount of fluid along the anterior aspect of the tibiotalar joint which is amenable for percutaneous aspiration".  Discharge Exam: Blood pressure (!) 118/94, pulse (!) 101, temperature 97.7 F (36.5 C), temperature source Oral, resp. rate 18, height 5' 9" (1.753 m), weight 93.4 kg, SpO2 98 %.   Disposition: Discharge disposition: 01-Home or Self Care

## 2018-01-31 NOTE — Progress Notes (Signed)
[facility] Coordinator will follow pt with ID team to support Home Infusion Pharmacy services as ordered at DC if IV ABX are needed.  If patient discharges after hours, please call [phone].   Larry Sierras 01/31/2018, 7:45 AM

## 2018-01-31 NOTE — Progress Notes (Signed)
Office Visit Note   Patient: [name]           Date of Birth: September 06, 1965           MRN: [id number] Visit Date: 01/27/2018              Requested by: Lovena Neighbours, MD [address] [place], [place] [address] PCP: Lovena Neighbours, MD  Chief Complaint  Patient presents with  . Left Ankle - Pain      HPI: Patient is a 52 year old gentleman who presents 4 weeks status post fusion of the left ankle.  Patient states he twisted the ankle last week trying to move something at home complains of pain and a new ulcer over the lateral aspect of the tibia.  The patient states he was moving a couch over the weekend and it did not hurt but he complains of swelling at this time and pain.  Patient is a type II diabetic and is a heavy smoker.  Assessment & Plan: Visit Diagnoses:  1. Status post ankle fusion   2. Hardware complicating wound infection, initial encounter [facility])     Plan: Patient clinically has infected hardware.  Cultures were obtained we will set him up for an MRI scan to further evaluate the extent of the infection within the ankle fusion and we will set him up for infectious disease consult.  Follow-Up Instructions: Return in about 2 weeks (around 02/10/2018).   Ortho Exam  Patient is alert, oriented, no adenopathy, well-dressed, normal affect, normal respiratory effort. Examination patient has swelling around the ankle.  There is an ulcer laterally.  After informed consent this was cleansed a deep cultures was obtained this appears to communicate with the screw for the fusion.  There is clear drainage there is no purulence no ascending cellulitis.  Imaging: No results found. No images are attached to the encounter.  Labs: Lab Results  Component Value Date   HGBA1C 5.6 11/05/2016   HGBA1C 6.0 (H) 07/10/2016   HGBA1C 5.8 04/10/2016   ESRSEDRATE 94 (H) 01/28/2018   ESRSEDRATE 9 11/05/2016   ESRSEDRATE 7 10/26/2016   CRP 12.6 (H) 01/28/2018   CRP 21.9 (H)  11/05/2016   CRP 82.8 (H) 10/26/2016   LABURIC 5.2 04/17/2014   REPTSTATUS PENDING 01/29/2018   GRAMSTAIN  01/29/2018    MODERATE WBC PRESENT,BOTH PMN AND MONONUCLEAR NO ORGANISMS SEEN    CULT  01/29/2018    TOO YOUNG TO READ Performed at [facility], [address], [place], [place] [address]    LABORGA CITROBACTER KOSERI (A) 07/10/2016     Lab Results  Component Value Date   ALBUMIN 3.4 (L) 01/28/2018   ALBUMIN 4.3 06/29/2017   ALBUMIN 4.1 04/28/2017   LABURIC 5.2 04/17/2014    There is no height or weight on file to calculate BMI.  Orders:  Orders Placed This Encounter  Procedures  . Wound culture  . XR Ankle Complete Left  . MR Ankle Left w/o contrast   No orders of the defined types were placed in this encounter.    Procedures: No procedures performed  Clinical Data: No additional findings.  ROS:  All other systems negative, except as noted in the HPI. Review of Systems  Objective: Vital Signs: There were no vitals taken for this visit.  Specialty Comments:  No specialty comments available.  PMFS History: Patient Active Problem List   Diagnosis Date Noted  . Hardware complicating wound infection (HCC) 01/31/2018  . Postoperative  wound infection 01/28/2018  . Spondylolisthesis, lumbar region 12/30/2017  . S/P ankle fusion 10/20/2017  . Seasonal allergies 06/29/2017  . Post-traumatic osteoarthritis, left ankle and foot 01/07/2017  . Presence of right artificial knee joint 12/17/2016  . Primary osteoarthritis of right knee 05/27/2016  . GERD (gastroesophageal reflux disease) 07/09/2015  . Mild persistent asthma 07/09/2015  . Type 2 diabetes mellitus (HCC) 07/09/2015  . Polyp of duodenum 06/04/2015  . Upper GI bleed 02/18/2015  . Benign essential HTN 02/18/2015  . Anemia of chronic disease 02/18/2015  . Pulmonary nodule 02/18/2015  . Alcohol dependence with intoxication with complication (HCC)   . Cocaine abuse (HCC) 03/02/2014  .  Posttraumatic stress disorder 03/02/2014  . History of CVA (cerebrovascular accident) 01/12/2014  . Schizophrenia (HCC) 01/12/2014  . Tobacco abuse 12/06/2013  . Tobacco dependence syndrome 12/06/2013   Past Medical History:  Diagnosis Date  . Anxiety   . Arthritis   . Asthma   . Duodenal adenoma   . GERD (gastroesophageal reflux disease)   . Hypertension   . Pneumonia    hx  . Post-traumatic osteoarthritis of left ankle   . Pre-diabetes   . Schizophrenia (HCC)   . Stroke [facility])    2009-or10    Family History  Adopted: Yes  Problem Relation Age of Onset  . Colon cancer Neg Hx   . Esophageal cancer Neg Hx   . Rectal cancer Neg Hx   . Stomach cancer Neg Hx     Past Surgical History:  Procedure Laterality Date  . ANKLE ARTHROSCOPY Left 10/20/2017   Procedure: ANKLE ARTHROSCOPY;  Surgeon: Nadara Mustard, MD;  Location: [facility] OR;  Service: Orthopedics;  Laterality: Left;  . ANKLE FUSION Left 10/20/2017  . ANKLE FUSION Left 10/20/2017   Procedure: LEFT ANKLE FUSION;  Surgeon: Nadara Mustard, MD;  Location: [facility] OR;  Service: Orthopedics;  Laterality: Left;  . ESOPHAGOGASTRODUODENOSCOPY Left 12/05/2013   Procedure: ESOPHAGOGASTRODUODENOSCOPY (EGD);  Surgeon: Willis Modena, MD;  Location: [facility]COPY;  Service: Endoscopy;  Laterality: Left;  . ESOPHAGOGASTRODUODENOSCOPY (EGD) WITH PROPOFOL N/A 02/20/2015   Procedure: ESOPHAGOGASTRODUODENOSCOPY (EGD) WITH PROPOFOL;  Surgeon: Charlott Rakes, MD;  Location: [facility] ENDOSCOPY;  Service: Endoscopy;  Laterality: N/A;  . TOTAL KNEE ARTHROPLASTY Right 07/21/2016   Procedure: TOTAL KNEE ARTHROPLASTY;  Surgeon: Cammy Copa, MD;  Location: [facility] OR;  Service: Orthopedics;  Laterality: Right;  . UPPER GASTROINTESTINAL ENDOSCOPY     Social History   Occupational History  . Not on file  Tobacco Use  . Smoking status: Heavy Tobacco Smoker    Packs/day: 1.50    Years: 38.00    Pack years: 57.00    Types: Cigarettes  . Smokeless tobacco: Never  Used  . Tobacco comment: onset age 74; upto 1.5 ppd  Substance and Sexual Activity  . Alcohol use: Not Currently    Alcohol/week: 3.0 standard drinks    Types: 3 Cans of beer per week    Comment: quit 2015; formerly up to 2 fifths/day  . Drug use: No  . Sexual activity: Yes

## 2018-02-01 ENCOUNTER — Telehealth (INDEPENDENT_AMBULATORY_CARE_PROVIDER_SITE_OTHER): Payer: Self-pay | Admitting: Orthopedic Surgery

## 2018-02-01 ENCOUNTER — Other Ambulatory Visit (INDEPENDENT_AMBULATORY_CARE_PROVIDER_SITE_OTHER): Payer: Self-pay

## 2018-02-01 DIAGNOSIS — T847XXA Infection and inflammatory reaction due to other internal orthopedic prosthetic devices, implants and grafts, initial encounter: Secondary | ICD-10-CM

## 2018-02-01 NOTE — Telephone Encounter (Signed)
I called and sw carol to advise that the pt should be elevating foot higher that heart and non weight bearing. He can use ice 15-20 min at a time 3-4 times a week. He can take 2 aleve bid in addition to the pain medication. I advised that we can see him sooner in the office if they are thinking that he will run out before his appt. Voiced understanding with conservative treatment in addition to medication and will call with any questions.

## 2018-02-01 NOTE — Telephone Encounter (Signed)
[name] states the oxycodone 5 mg, patient is taking 2 every 4 hours is not helping patients pain right now and wants to know if there is anything else he can do for pain. She also wants to know what the bacteria or infection was? CB # [phone]

## 2018-02-01 NOTE — Telephone Encounter (Signed)
[name] said if you try calling to please call her cell phone # [phone]

## 2018-02-02 LAB — CULTURE, BLOOD (ROUTINE X 2)
CULTURE: NO GROWTH
CULTURE: NO GROWTH
Special Requests: ADEQUATE

## 2018-02-03 LAB — AEROBIC/ANAEROBIC CULTURE (SURGICAL/DEEP WOUND)

## 2018-02-03 LAB — AEROBIC/ANAEROBIC CULTURE W GRAM STAIN (SURGICAL/DEEP WOUND)

## 2018-02-07 ENCOUNTER — Telehealth (INDEPENDENT_AMBULATORY_CARE_PROVIDER_SITE_OTHER): Payer: Self-pay | Admitting: Orthopedic Surgery

## 2018-02-07 NOTE — Telephone Encounter (Signed)
Refill at follow up

## 2018-02-07 NOTE — Telephone Encounter (Signed)
01/29/18 pt is removal of infected hardware. rx given rx for Oxycodone 5 mg # 30 01/31/18 requesting refill has an appt for follow up on 02/10/18

## 2018-02-07 NOTE — Telephone Encounter (Signed)
Patient called to request an RX refill on his oxycodone.  CB#[phone].  Thank you.

## 2018-02-09 ENCOUNTER — Ambulatory Visit (INDEPENDENT_AMBULATORY_CARE_PROVIDER_SITE_OTHER): Payer: Medicare Other | Admitting: Infectious Diseases

## 2018-02-09 ENCOUNTER — Encounter: Payer: Self-pay | Admitting: Infectious Diseases

## 2018-02-09 VITALS — BP 133/90 | HR 118 | Temp 98.1°F | Wt 219.0 lb

## 2018-02-09 DIAGNOSIS — Z5181 Encounter for therapeutic drug level monitoring: Secondary | ICD-10-CM

## 2018-02-09 DIAGNOSIS — T847XXA Infection and inflammatory reaction due to other internal orthopedic prosthetic devices, implants and grafts, initial encounter: Secondary | ICD-10-CM | POA: Diagnosis not present

## 2018-02-09 MED ORDER — SULFAMETHOXAZOLE-TRIMETHOPRIM 800-160 MG PO TABS: 2.0000 | ORAL_TABLET | Freq: Two times a day (BID) | ORAL | 1 refills | Status: AC

## 2018-02-09 NOTE — Patient Instructions (Signed)
Continue your bactrim 2 tablets twice a day.   Follow up with Dr. Sharol Given tomorrow as scheduled - will look out for his note to see what he thinks.   Will have you come back in 3 weeks to see me or Dr. Linus Salmons to check on your ankle again.

## 2018-02-09 NOTE — Assessment & Plan Note (Addendum)
Check labs as above today. Will have him repeat BMP weekly x 2 more. Will repeat ESR/CRP halfway through therapy.

## 2018-02-09 NOTE — Telephone Encounter (Signed)
I called and sw pt's wife and advised of message below. appt tomorrow and will address all issues at that time.

## 2018-02-09 NOTE — Progress Notes (Signed)
Patient: [name]  DOB: [birth year] MRN: [id number] PCP: Lovena Neighbours, MD  Referring Provider: hospital follow up   Patient Active Problem List   Diagnosis Date Noted  . Medication monitoring encounter 02/09/2018  . Hardware complicating wound infection (HCC) 01/31/2018  . Postoperative wound infection 01/28/2018  . Spondylolisthesis, lumbar region 12/30/2017  . S/P ankle fusion 10/20/2017  . Screening for colon cancer 06/29/2017  . Seasonal allergies 06/29/2017  . Post-traumatic osteoarthritis, left ankle and foot 01/07/2017  . Presence of right artificial knee joint 12/17/2016  . Primary osteoarthritis of right knee 05/27/2016  . GERD (gastroesophageal reflux disease) 07/09/2015  . Mild persistent asthma 07/09/2015  . Type 2 diabetes mellitus (HCC) 07/09/2015  . Polyp of duodenum 06/04/2015  . Upper GI bleed 02/18/2015  . Benign essential HTN 02/18/2015  . Anemia of chronic disease 02/18/2015  . Pulmonary nodule 02/18/2015  . Cocaine abuse (HCC) 03/02/2014  . Posttraumatic stress disorder 03/02/2014  . History of CVA (cerebrovascular accident) 01/12/2014  . Schizophrenia (HCC) 01/12/2014  . Tobacco abuse 12/06/2013  . Tobacco dependence syndrome 12/06/2013     Subjective:  [name] is a 52 y.o. man here today for follow up on wound infection after recent hospitalization.   In July 2019 Kimberly underwent internal fixation and fusion of the left ankle due to post-traumatic arthritis. He fell and re-injured this ankle in August but seemed to be healing up well until late August when he experienced fever to 33 F; xray revealed stable fusion/hardware and no external evidence of infection. Larey Seat again October 23rd and felt a pop noise. He subsequently developed a boil on the lateral side of his left ankle leading to readmission on 11/01 and concern for deeper infection. Hardware/screws were removed by Dr. Lajoyce Corners on 11/02. Intra-op cultures were negative; office  cultures from outpatient evaluation with Dr. Lajoyce Corners prior to hospitalization revealed enterobacter. He was transitioned to Bactrim DS 2 tabs BID with intention of continuing treatment through at least 11/29 to complete 4-6 weeks of therapy.   Since discharge from the hospital he continues to have pain and swelling. He and his wife tell me that he is walking around a little but mostly staying off foot. Tolerating the bactrim well but needs more medications because he spilled a bunch from his bottle and they were soiled and unable to take. He is doing 2 pills twice a day as prescribed. He has no drainage from incisions and sutures are intact. He goes to see Dr. Lajoyce Corners tomorrow - has a lot of questions about wound care. No rashes, had some diarrhea at first but resolved. No fevers, chills or night sweats.   Review of Systems  Constitutional: Negative for chills and fever.  HENT: Negative for tinnitus.   Eyes: Negative for blurred vision and photophobia.  Respiratory: Negative for cough and sputum production.   Cardiovascular: Positive for leg swelling (L ankle as above). Negative for chest pain.  Gastrointestinal: Negative for diarrhea, nausea and vomiting.  Genitourinary: Negative for dysuria.  Musculoskeletal: Positive for joint pain (L ankle as above).  Skin: Negative for rash.  Neurological: Negative for weakness and headaches.    Past Medical History:  Diagnosis Date  . Anxiety   . Arthritis   . Asthma   . Duodenal adenoma   . GERD (gastroesophageal reflux disease)   . Hypertension   . Pneumonia    hx  . Post-traumatic osteoarthritis of left ankle   . Pre-diabetes   .  Schizophrenia (HCC)   . Stroke [facility])    2009-or10    Outpatient Medications Prior to Visit  Medication Sig Dispense Refill  . albuterol (PROVENTIL HFA;VENTOLIN HFA) 108 (90 Base) MCG/ACT inhaler Inhale 1 puff into the lungs 2 (two) times daily as needed for wheezing or shortness of breath. 1 Inhaler 1  . amLODipine  (NORVASC) 2.5 MG tablet Take 1 tablet (2.5 mg total) by mouth daily. 30 tablet 0  . benztropine (COGENTIN) 1 MG tablet Take 1 mg by mouth daily.    . bisacodyl (DULCOLAX) 10 MG suppository Place 1 suppository (10 mg total) rectally daily as needed for moderate constipation. 12 suppository 0  . buPROPion (WELLBUTRIN XL) 150 MG 24 hr tablet TAKE 1 TABLET(150 MG) BY MOUTH DAILY (Patient taking differently: Take 150 mg by mouth daily. ) 30 tablet 2  . cetirizine (ZYRTEC) 10 MG tablet Take 1 tablet (10 mg total) by mouth daily. 30 tablet 11  . docusate sodium (COLACE) 100 MG capsule Take 1 capsule (100 mg total) by mouth 2 (two) times daily. 10 capsule 0  . INVEGA TRINZA 819 MG/2.625ML SUSY Inject 819 mg into the skin every 3 (three) months.  3  . losartan (COZAAR) 50 MG tablet TAKE 1 TABLET BY MOUTH ONCE DAILY. (Patient taking differently: Take 50 mg by mouth daily. ) 28 tablet 5  . mirtazapine (REMERON) 15 MG tablet Take 15 mg by mouth at bedtime.    . Multiple Vitamins-Minerals (VITRUM SENIOR) TABS Take 1 tablet by mouth daily.     Marland Kitchen omeprazole (PRILOSEC) 40 MG capsule Take 1 capsule (40 mg total) by mouth daily. 30 capsule 11  . oxyCODONE (OXY IR/ROXICODONE) 5 MG immediate release tablet Take 1-2 tablets (5-10 mg total) by mouth every 4 (four) hours as needed for moderate pain (pain score 4-6). 30 tablet 0  . polyethylene glycol (MIRALAX / GLYCOLAX) packet Take 17 g by mouth daily as needed for mild constipation. 14 each 0  . sucralfate (CARAFATE) 1 g tablet TAKE 1 TABLET FOUR TIMES DAILY FOR ACID REFLUX. (Patient taking differently: Take 1 g by mouth 4 (four) times daily. ) 112 tablet 0  . sucralfate (CARAFATE) 1 g tablet TAKE 1 TABLET FOUR TIMES DAILY FOR ACID REFLUX. 112 tablet 0  . sulfamethoxazole-trimethoprim (BACTRIM DS,SEPTRA DS) 800-160 MG tablet Take 2 tablets by mouth 2 (two) times daily for 25 days. 100 tablet 0   No facility-administered medications prior to visit.      No Known  Allergies  Social History   Tobacco Use  . Smoking status: Heavy Tobacco Smoker    Packs/day: 1.50    Years: 38.00    Pack years: 57.00    Types: Cigarettes  . Smokeless tobacco: Never Used  . Tobacco comment: onset age 40; upto 1.5 ppd  Substance Use Topics  . Alcohol use: Not Currently    Alcohol/week: 3.0 standard drinks    Types: 3 Cans of beer per week    Comment: quit 2015; formerly up to 2 fifths/day  . Drug use: No    Objective:   Vitals:   02/09/18 1623  BP: 133/90  Pulse: (!) 118  Temp: 98.1 F (36.7 C)  TempSrc: Oral  Weight: 219 lb (99.3 kg)   Body mass index is 32.34 kg/m.  Physical Exam  Constitutional: He is oriented to person, place, and time. He appears well-developed and well-nourished.  Seated comfortably in chair today with his wife. He is non-toxic appearing.   HENT:  Mouth/Throat: Oropharynx is clear and moist. No oral lesions. Normal dentition. No dental caries.  Eyes: Pupils are equal, round, and reactive to light. No scleral icterus.  Cardiovascular: Normal rate, regular rhythm, normal heart sounds and intact distal pulses.  Pulmonary/Chest: Effort normal and breath sounds normal.  Musculoskeletal:  L ankle as pictured below. The ankle is cool to the touch. Some hyperpigmented areas but no erythema. Scaled skin likely with resolution of some swelling following surgery. Sutures are intact. Incisions are approximated except the ends of the lateral malleolar incision. Tenderness under left lateral malleolus.   Lymphadenopathy:    He has no cervical adenopathy.  Neurological: He is alert and oriented to person, place, and time.  Skin: Skin is warm and dry. No rash noted.         Lab Results: Lab Results  Component Value Date   WBC 12.5 (H) 02/09/2018   HGB 11.8 (L) 02/09/2018   HCT 35.5 (L) 02/09/2018   MCV 79.2 (L) 02/09/2018   PLT 559 (H) 02/09/2018    Lab Results  Component Value Date   CREATININE 1.33 02/09/2018   BUN 9  02/09/2018   NA 133 (L) 02/09/2018   K 4.4 02/09/2018   CL 99 02/09/2018   CO2 26 02/09/2018    Lab Results  Component Value Date   ALT 8 01/28/2018   AST 18 01/28/2018   ALKPHOS 99 01/28/2018   BILITOT 0.6 01/28/2018     Assessment & Plan:   Problem List Items Addressed This Visit      Unprioritized   Hardware complicating wound infection (HCC) - Primary    S/P removal of hardware now POD #12. He continues to have ongoing swelling and tenderness. He has no areas of fluctuance surrounding incisions and no warmth to ankle. He is tachycardic but non-toxic appearing. He has follow up with Dr. Lajoyce Corners tomorrow scheduled. I am concerned about the swelling; Enterobacter cloacea can be difficult to treat and develops resistance to cephalosporins easily despite sensitivities. Bactrim is a good choice and he is dosed well. May require further surgery - pending Dr. Audrie Lia evaluation tomorrow.  Will check CBC, BMP, CRP, ESR today.  Continue oral bactrim 2 DS tabs BID for now. Will have him return in 1-2 weeks to reassess barring no changes in condition; may consider changing to Levaquin 750 mg QD.       Relevant Medications   sulfamethoxazole-trimethoprim (BACTRIM DS,SEPTRA DS) 800-160 MG tablet   Other Relevant Orders   C-reactive protein (Completed)   Sedimentation rate (Completed)   CBC (Completed)   Basic metabolic panel (Completed)   Medication monitoring encounter    Check labs as above today. Will have him repeat BMP weekly x 2 more. Will repeat ESR/CRP halfway through therapy.         Rexene Alberts, MSN, NP-C [facility] Pager: [phone] Office: [phone]  02/10/18  1:48 PM

## 2018-02-09 NOTE — Assessment & Plan Note (Addendum)
S/P removal of hardware now POD #12. He continues to have ongoing swelling and tenderness. He has no areas of fluctuance surrounding incisions and no warmth to ankle. He is tachycardic but non-toxic appearing. He has follow up with Dr. Sharol Given tomorrow scheduled. I am concerned about the swelling; Enterobacter cloacea can be difficult to treat and develops resistance to cephalosporins easily despite sensitivities. Bactrim is a good choice and he is dosed well. May require further surgery - pending Dr. Jess Barters evaluation tomorrow.  Will check CBC, BMP, CRP, ESR today.  Continue oral bactrim 2 DS tabs BID for now. Will have him return in 1-2 weeks to reassess barring no changes in condition; may consider changing to Levaquin 750 mg QD.

## 2018-02-10 ENCOUNTER — Ambulatory Visit (INDEPENDENT_AMBULATORY_CARE_PROVIDER_SITE_OTHER): Payer: Medicare Other | Admitting: Orthopedic Surgery

## 2018-02-10 ENCOUNTER — Encounter (INDEPENDENT_AMBULATORY_CARE_PROVIDER_SITE_OTHER): Payer: Self-pay | Admitting: Orthopedic Surgery

## 2018-02-10 ENCOUNTER — Ambulatory Visit (INDEPENDENT_AMBULATORY_CARE_PROVIDER_SITE_OTHER): Payer: Medicare Other

## 2018-02-10 VITALS — Ht 69.0 in | Wt 219.0 lb

## 2018-02-10 DIAGNOSIS — G8929 Other chronic pain: Secondary | ICD-10-CM

## 2018-02-10 DIAGNOSIS — M25572 Pain in left ankle and joints of left foot: Secondary | ICD-10-CM

## 2018-02-10 DIAGNOSIS — T847XXA Infection and inflammatory reaction due to other internal orthopedic prosthetic devices, implants and grafts, initial encounter: Secondary | ICD-10-CM

## 2018-02-10 LAB — BASIC METABOLIC PANEL
BUN: 9 mg/dL (ref 7–25)
CALCIUM: 10 mg/dL (ref 8.6–10.3)
CO2: 26 mmol/L (ref 20–32)
Chloride: 99 mmol/L (ref 98–110)
Creat: 1.33 mg/dL (ref 0.70–1.33)
GLUCOSE: 81 mg/dL (ref 65–99)
POTASSIUM: 4.4 mmol/L (ref 3.5–5.3)
SODIUM: 133 mmol/L — AB (ref 135–146)

## 2018-02-10 LAB — SEDIMENTATION RATE: Sed Rate: 28 mm/h — ABNORMAL HIGH (ref 0–20)

## 2018-02-10 LAB — CBC
HCT: 35.5 % — ABNORMAL LOW (ref 38.5–50.0)
Hemoglobin: 11.8 g/dL — ABNORMAL LOW (ref 13.2–17.1)
MCH: 26.3 pg — ABNORMAL LOW (ref 27.0–33.0)
MCHC: 33.2 g/dL (ref 32.0–36.0)
MCV: 79.2 fL — AB (ref 80.0–100.0)
MPV: 9.3 fL (ref 7.5–12.5)
PLATELETS: 559 10*3/uL — AB (ref 140–400)
RBC: 4.48 10*6/uL (ref 4.20–5.80)
RDW: 14.3 % (ref 11.0–15.0)
WBC: 12.5 10*3/uL — ABNORMAL HIGH (ref 3.8–10.8)

## 2018-02-10 LAB — C-REACTIVE PROTEIN: CRP: 9 mg/L — ABNORMAL HIGH (ref <8.0)

## 2018-02-10 MED ORDER — OXYCODONE HCL 5 MG PO TABS: 5.0000 mg | ORAL_TABLET | ORAL | 0 refills | Status: DC | PRN

## 2018-02-10 NOTE — Progress Notes (Signed)
Office Visit Note   Patient: [name]           Date of Birth: 1965/06/02           MRN: [id number] Visit Date: 02/10/2018              Requested by: Lovena Neighbours, MD [address] [place], [place] [address] PCP: Lovena Neighbours, MD  Chief Complaint  Patient presents with  . Left Ankle - Routine Post Op    01/29/18 removal of retained hardware       HPI: Patient is a 52 year old gentleman who presents status post removal of deep retained hardware for infection status post ankle fusion.  Patient states he has some pain on the lateral side no pain on the medial side.  Patient states he just saw infectious disease and has been switched to oral antibiotics.  Patient is full weightbearing with regular shoes he is supposed to be nonweightbearing.  Assessment & Plan: Visit Diagnoses:  1. Chronic pain of left ankle   2. Hardware complicating wound infection, initial encounter (HCC)     Plan: The sutures are harvested we will place him in a short leg cast follow-up in 3 weeks with repeat three-view radiographs of the left ankle out of plaster.  Follow-Up Instructions: Return in about 3 weeks (around 03/03/2018).   Ortho Exam  Patient is alert, oriented, no adenopathy, well-dressed, normal affect, normal respiratory effort. Examination incisions are well-healed there is no redness no cellulitis no drainage.  Patient is full weightbearing with a cane in his sneaker.  There is increased swelling around the ankle but there is no signs of infection no drainage.  Imaging: Xr Ankle Complete Left  Result Date: 02/10/2018 3 view radiographs of the left ankle show stable alignment no increased lytic changes.  Patient appears to have a fibrous union of the ankle fusion.  No images are attached to the encounter.  Labs: Lab Results  Component Value Date   HGBA1C 5.6 11/05/2016   HGBA1C 6.0 (H) 07/10/2016   HGBA1C 5.8 04/10/2016   ESRSEDRATE 28 (H) 02/09/2018   ESRSEDRATE 94  (H) 01/28/2018   ESRSEDRATE 9 11/05/2016   CRP 9.0 (H) 02/09/2018   CRP 12.6 (H) 01/28/2018   CRP 21.9 (H) 11/05/2016   LABURIC 5.2 04/17/2014   REPTSTATUS 02/03/2018 FINAL 01/29/2018   GRAMSTAIN  01/29/2018    MODERATE WBC PRESENT,BOTH PMN AND MONONUCLEAR NO ORGANISMS SEEN    CULT  01/29/2018    RARE ENTEROBACTER CLOACAE CRITICAL RESULT CALLED TO, READ BACK BY AND VERIFIED WITH: Durwin Nora RN, AT 1156 01/31/18 BY D. Leighton Roach REGARDING CULTURE GROWTH NO ANAEROBES ISOLATED Performed at [facility], [address] [place], [place] [address]    LABORGA ENTEROBACTER CLOACAE 01/29/2018     Lab Results  Component Value Date   ALBUMIN 3.4 (L) 01/28/2018   ALBUMIN 4.3 06/29/2017   ALBUMIN 4.1 04/28/2017   LABURIC 5.2 04/17/2014    Body mass index is 32.34 kg/m.  Orders:  Orders Placed This Encounter  Procedures  . XR Ankle Complete Left   Meds ordered this encounter  Medications  . oxyCODONE (OXY IR/ROXICODONE) 5 MG immediate release tablet    Sig: Take 1-2 tablets (5-10 mg total) by mouth every 4 (four) hours as needed for moderate pain or severe pain (pain score 4-6).    Dispense:  30 tablet    Refill:  0     Procedures: No procedures performed  Clinical Data:  No additional findings.  ROS:  All other systems negative, except as noted in the HPI. Review of Systems  Objective: Vital Signs: Ht 5\' 9"  (1.753 m)   Wt 219 lb (99.3 kg)   BMI 32.34 kg/m   Specialty Comments:  No specialty comments available.  PMFS History: Patient Active Problem List   Diagnosis Date Noted  . Medication monitoring encounter 02/09/2018  . Hardware complicating wound infection (HCC) 01/31/2018  . Postoperative wound infection 01/28/2018  . Spondylolisthesis, lumbar region 12/30/2017  . S/P ankle fusion 10/20/2017  . Screening for colon cancer 06/29/2017  . Seasonal allergies 06/29/2017  . Post-traumatic osteoarthritis, left ankle and foot 01/07/2017  . Presence of  right artificial knee joint 12/17/2016  . Primary osteoarthritis of right knee 05/27/2016  . GERD (gastroesophageal reflux disease) 07/09/2015  . Mild persistent asthma 07/09/2015  . Type 2 diabetes mellitus (HCC) 07/09/2015  . Polyp of duodenum 06/04/2015  . Upper GI bleed 02/18/2015  . Benign essential HTN 02/18/2015  . Anemia of chronic disease 02/18/2015  . Pulmonary nodule 02/18/2015  . Cocaine abuse (HCC) 03/02/2014  . Posttraumatic stress disorder 03/02/2014  . History of CVA (cerebrovascular accident) 01/12/2014  . Schizophrenia (HCC) 01/12/2014  . Tobacco abuse 12/06/2013  . Tobacco dependence syndrome 12/06/2013   Past Medical History:  Diagnosis Date  . Anxiety   . Arthritis   . Asthma   . Duodenal adenoma   . GERD (gastroesophageal reflux disease)   . Hypertension   . Pneumonia    hx  . Post-traumatic osteoarthritis of left ankle   . Pre-diabetes   . Schizophrenia (HCC)   . Stroke [facility])    2009-or10    Family History  Adopted: Yes  Problem Relation Age of Onset  . Colon cancer Neg Hx   . Esophageal cancer Neg Hx   . Rectal cancer Neg Hx   . Stomach cancer Neg Hx     Past Surgical History:  Procedure Laterality Date  . ANKLE ARTHROSCOPY Left 10/20/2017   Procedure: ANKLE ARTHROSCOPY;  Surgeon: Nadara Mustard, MD;  Location: [facility] OR;  Service: Orthopedics;  Laterality: Left;  . ANKLE FUSION Left 10/20/2017  . ANKLE FUSION Left 10/20/2017   Procedure: LEFT ANKLE FUSION;  Surgeon: Nadara Mustard, MD;  Location: [facility] OR;  Service: Orthopedics;  Laterality: Left;  . ESOPHAGOGASTRODUODENOSCOPY Left 12/05/2013   Procedure: ESOPHAGOGASTRODUODENOSCOPY (EGD);  Surgeon: Willis Modena, MD;  Location: [facility]COPY;  Service: Endoscopy;  Laterality: Left;  . ESOPHAGOGASTRODUODENOSCOPY (EGD) WITH PROPOFOL N/A 02/20/2015   Procedure: ESOPHAGOGASTRODUODENOSCOPY (EGD) WITH PROPOFOL;  Surgeon: Charlott Rakes, MD;  Location: [facility] ENDOSCOPY;  Service: Endoscopy;  Laterality: N/A;    . HARDWARE REMOVAL Left 01/29/2018   Procedure: HARDWARE REMOVAL LEFT ANKLE;  Surgeon: Nadara Mustard, MD;  Location: [facility] OR;  Service: Orthopedics;  Laterality: Left;  . TOTAL KNEE ARTHROPLASTY Right 07/21/2016   Procedure: TOTAL KNEE ARTHROPLASTY;  Surgeon: Cammy Copa, MD;  Location: [facility] OR;  Service: Orthopedics;  Laterality: Right;  . UPPER GASTROINTESTINAL ENDOSCOPY     Social History   Occupational History  . Not on file  Tobacco Use  . Smoking status: Heavy Tobacco Smoker    Packs/day: 1.50    Years: 38.00    Pack years: 57.00    Types: Cigarettes  . Smokeless tobacco: Never Used  . Tobacco comment: onset age 67; upto 1.5 ppd  Substance and Sexual Activity  . Alcohol use: Not Currently    Alcohol/week: 3.0 standard drinks  Types: 3 Cans of beer per week    Comment: quit 2015; formerly up to 2 fifths/day  . Drug use: No  . Sexual activity: Yes

## 2018-02-11 ENCOUNTER — Telehealth (INDEPENDENT_AMBULATORY_CARE_PROVIDER_SITE_OTHER): Payer: Self-pay | Admitting: Orthopedic Surgery

## 2018-02-11 NOTE — Telephone Encounter (Signed)
Patient called wanted to speak with Dr.Duda pertaining to his diagnosis. He stated he wasn't sure what disease he had and he  needed clarification.

## 2018-02-14 ENCOUNTER — Encounter (INDEPENDENT_AMBULATORY_CARE_PROVIDER_SITE_OTHER): Payer: Self-pay | Admitting: Orthopedic Surgery

## 2018-02-14 ENCOUNTER — Telehealth: Payer: Self-pay | Admitting: *Deleted

## 2018-02-14 ENCOUNTER — Telehealth (INDEPENDENT_AMBULATORY_CARE_PROVIDER_SITE_OTHER): Payer: Self-pay | Admitting: Radiology

## 2018-02-14 ENCOUNTER — Ambulatory Visit (INDEPENDENT_AMBULATORY_CARE_PROVIDER_SITE_OTHER): Payer: Self-pay

## 2018-02-14 ENCOUNTER — Telehealth: Payer: Self-pay | Admitting: Infectious Diseases

## 2018-02-14 ENCOUNTER — Ambulatory Visit (INDEPENDENT_AMBULATORY_CARE_PROVIDER_SITE_OTHER): Payer: Medicare Other | Admitting: Physician Assistant

## 2018-02-14 VITALS — Ht 69.0 in | Wt 219.0 lb

## 2018-02-14 DIAGNOSIS — Z9889 Other specified postprocedural states: Secondary | ICD-10-CM

## 2018-02-14 DIAGNOSIS — M25572 Pain in left ankle and joints of left foot: Secondary | ICD-10-CM

## 2018-02-14 DIAGNOSIS — T847XXD Infection and inflammatory reaction due to other internal orthopedic prosthetic devices, implants and grafts, subsequent encounter: Secondary | ICD-10-CM

## 2018-02-14 NOTE — Telephone Encounter (Signed)
[name] called on patient's behalf. [name] has had increased swelling since he had a cast placed 11/14. Patient went to Dr Sharol Given today for evaluation.  RN returned call let them know lab results, that antibiotic was a good match per [place].  Patient would like to continue to follow up with [facility] as scheduled. Landis Gandy, RN

## 2018-02-14 NOTE — Telephone Encounter (Signed)
[name] called on patients behalf. Stated cast was placed on last Thursday at appointment. Cast started feeling tight, causing pain. Called on call stated to elevate and call office Monday. [name] stated noticed toes are starting to turn dark color, said she could not get ride to office today. She is calling ambulance (only transportation) to go to ER, due to increased pain, tightness and color of toes.  [phone]

## 2018-02-14 NOTE — Telephone Encounter (Signed)
Reviewed office visit from PA with ortho and no symptoms c/w infection outside swelling. His inflammatory markers have improved with treatment. Would continue oral bactrim 2 DS tabs BID (reviewed with [facility] as well) until follow up. Wound care per orthopedic team. Reinforce non-weight bearing status on ankle as indicated in Dr. Jess Barters last note.

## 2018-02-14 NOTE — Telephone Encounter (Signed)
Pt in office today cast removed no s/s of decreased circulation. Dr. Sharol Given evaluated pt and placed in a fx boot.

## 2018-02-14 NOTE — Telephone Encounter (Signed)
I called and sw pt and states that he has been elevating his leg higher than his heart and minimizing weight bearing. He states that his toes are discolored and I advised that he needs to come into the office. Pt states that he is not able to get transportation and that he is going to go to the ER via EMS. I advised the pt to call and let us know how he is doing this afternoon and that we need to follow up with him in the office. I will hold this message and follow up with the pt.

## 2018-02-14 NOTE — Telephone Encounter (Signed)
Left vm wanting to know what the culture grew and if he is on the right medication. He saw Colletta Maryland in the hospital.

## 2018-02-14 NOTE — Progress Notes (Signed)
Office Visit Note   Patient: [name]           Date of Birth: 09/20/1965           MRN: [id number] Visit Date: 02/14/2018              Requested by: Lovena Neighbours, MD [address] [place], [place] [address] PCP: Lovena Neighbours, MD  Chief Complaint  Patient presents with  . Left Foot - Pain    Pt c/o of swelling and pain; wanted cast removed      HPI: The patient is a 52 yo male who is seen for post operative follow up following removal of left ankle hardware for infection following remote left ankle fusion. He has been in a short leg cast and ambulating on the cast without assistive device.  He returns today stating he is having severe pain in the cast and his toes are swollen and turning dark. He states the cast is too tight and walked into the clinic today asking for it to be removed.   Assessment & Plan: Visit Diagnoses:  1. Pain in left ankle and joints of left foot     Plan: The cast was removed. The patient does not desire to have another cast placed and states it hurts too much. He is agreeable to wear a fracture boot and was placed in this today and advised to continue to minimize weight bearing as much as possible. He will follow up in 4 weeks.   Follow-Up Instructions: Return in about 4 weeks (around 03/14/2018).   Ortho Exam  Patient is alert, oriented, no adenopathy, well-dressed, normal affect, normal respiratory effort. The left ankle has minimal edema localized to the ankle area. No erythema, no cellulitis or other signs of infection. Incisions healing well and without drainage.   Imaging: No results found. No images are attached to the encounter.  Labs: Lab Results  Component Value Date   HGBA1C 5.6 11/05/2016   HGBA1C 6.0 (H) 07/10/2016   HGBA1C 5.8 04/10/2016   ESRSEDRATE 28 (H) 02/09/2018   ESRSEDRATE 94 (H) 01/28/2018   ESRSEDRATE 9 11/05/2016   CRP 9.0 (H) 02/09/2018   CRP 12.6 (H) 01/28/2018   CRP 21.9 (H) 11/05/2016   LABURIC  5.2 04/17/2014   REPTSTATUS 02/03/2018 FINAL 01/29/2018   GRAMSTAIN  01/29/2018    MODERATE WBC PRESENT,BOTH PMN AND MONONUCLEAR NO ORGANISMS SEEN    CULT  01/29/2018    RARE ENTEROBACTER CLOACAE CRITICAL RESULT CALLED TO, READ BACK BY AND VERIFIED WITH: Durwin Nora RN, AT 1156 01/31/18 BY D. Leighton Roach REGARDING CULTURE GROWTH NO ANAEROBES ISOLATED Performed at [facility], [address] [place], [place] [address]    LABORGA ENTEROBACTER CLOACAE 01/29/2018     Lab Results  Component Value Date   ALBUMIN 3.4 (L) 01/28/2018   ALBUMIN 4.3 06/29/2017   ALBUMIN 4.1 04/28/2017   LABURIC 5.2 04/17/2014    Body mass index is 32.34 kg/m.  Orders:  Orders Placed This Encounter  Procedures  . XR Ankle Complete Left   No orders of the defined types were placed in this encounter.    Procedures: No procedures performed  Clinical Data: No additional findings.  ROS:  All other systems negative, except as noted in the HPI. Review of Systems  Objective: Vital Signs: Ht 5\' 9"  (1.753 m)   Wt 219 lb (99.3 kg)   BMI 32.34 kg/m   Specialty Comments:  No specialty comments available.  PMFS  History: Patient Active Problem List   Diagnosis Date Noted  . Medication monitoring encounter 02/09/2018  . Hardware complicating wound infection (HCC) 01/31/2018  . Postoperative wound infection 01/28/2018  . Spondylolisthesis, lumbar region 12/30/2017  . S/P ankle fusion 10/20/2017  . Screening for colon cancer 06/29/2017  . Seasonal allergies 06/29/2017  . Post-traumatic osteoarthritis, left ankle and foot 01/07/2017  . Presence of right artificial knee joint 12/17/2016  . Primary osteoarthritis of right knee 05/27/2016  . GERD (gastroesophageal reflux disease) 07/09/2015  . Mild persistent asthma 07/09/2015  . Type 2 diabetes mellitus (HCC) 07/09/2015  . Polyp of duodenum 06/04/2015  . Upper GI bleed 02/18/2015  . Benign essential HTN 02/18/2015  . Anemia of chronic  disease 02/18/2015  . Pulmonary nodule 02/18/2015  . Cocaine abuse (HCC) 03/02/2014  . Posttraumatic stress disorder 03/02/2014  . History of CVA (cerebrovascular accident) 01/12/2014  . Schizophrenia (HCC) 01/12/2014  . Tobacco abuse 12/06/2013  . Tobacco dependence syndrome 12/06/2013   Past Medical History:  Diagnosis Date  . Anxiety   . Arthritis   . Asthma   . Duodenal adenoma   . GERD (gastroesophageal reflux disease)   . Hypertension   . Pneumonia    hx  . Post-traumatic osteoarthritis of left ankle   . Pre-diabetes   . Schizophrenia (HCC)   . Stroke [facility])    2009-or10    Family History  Adopted: Yes  Problem Relation Age of Onset  . Colon cancer Neg Hx   . Esophageal cancer Neg Hx   . Rectal cancer Neg Hx   . Stomach cancer Neg Hx     Past Surgical History:  Procedure Laterality Date  . ANKLE ARTHROSCOPY Left 10/20/2017   Procedure: ANKLE ARTHROSCOPY;  Surgeon: Nadara Mustard, MD;  Location[facility] OR;  Service: Orthopedics;  Laterality: Left;  . ANKLE FUSION Left 10/20/2017  . ANKLE FUSION Left 10/20/2017   Procedure: LEFT ANKLE FUSION;  Surgeon: Nadara Mustard, MD;  Location: [facility] OR;  Service: Orthopedics;  Laterality: Left;  . ESOPHAGOGASTRODUODENOSCOPY Left 12/05/2013   Procedure: ESOPHAGOGASTRODUODENOSCOPY (EGD);  Surgeon: Willis Modena, MD;  Location: [facility];  Service: Endoscopy;  Laterality: Left;  . ESOPHAGOGASTRODUODENOSCOPY (EGD) WITH PROPOFOL N/A 02/20/2015   Procedure: ESOPHAGOGASTRODUODENOSCOPY (EGD) WITH PROPOFOL;  Surgeon: Charlott Rakes, MD;  Location: [facility] ENDOSCOPY;  Service: Endoscopy;  Laterality: N/A;  . HARDWARE REMOVAL Left 01/29/2018   Procedure: HARDWARE REMOVAL LEFT ANKLE;  Surgeon: Nadara Mustard, MD;  Location: [facility] OR;  Service: Orthopedics;  Laterality: Left;  . TOTAL KNEE ARTHROPLASTY Right 07/21/2016   Procedure: TOTAL KNEE ARTHROPLASTY;  Surgeon: Cammy Copa, MD;  Location: [facility] OR;  Service: Orthopedics;  Laterality: Right;  .  UPPER GASTROINTESTINAL ENDOSCOPY     Social History   Occupational History  . Not on file  Tobacco Use  . Smoking status: Heavy Tobacco Smoker    Packs/day: 1.50    Years: 38.00    Pack years: 57.00    Types: Cigarettes  . Smokeless tobacco: Never Used  . Tobacco comment: onset age 14; upto 1.5 ppd  Substance and Sexual Activity  . Alcohol use: Not Currently    Alcohol/week: 3.0 standard drinks    Types: 3 Cans of beer per week    Comment: quit 2015; formerly up to 2 fifths/day  . Drug use: No  . Sexual activity: Yes

## 2018-02-14 NOTE — Telephone Encounter (Signed)
Spoke with Colletta [place] and patient. Landis Gandy, RN

## 2018-02-21 ENCOUNTER — Telehealth (INDEPENDENT_AMBULATORY_CARE_PROVIDER_SITE_OTHER): Payer: Self-pay | Admitting: Orthopedic Surgery

## 2018-02-21 ENCOUNTER — Telehealth (INDEPENDENT_AMBULATORY_CARE_PROVIDER_SITE_OTHER): Payer: Self-pay

## 2018-02-21 NOTE — Telephone Encounter (Signed)
Called pt to advise of message below. Encouraged conservative measures and he will call with questions.

## 2018-02-21 NOTE — Telephone Encounter (Signed)
Too soon to refill.

## 2018-02-21 NOTE — Telephone Encounter (Signed)
Pt had a removal of hardware 01/29/18. Requesting refill on Oxycodone 5 mg. Refill recived 01/31/18 #30 and 11/14 #30 please advise.

## 2018-02-21 NOTE — Telephone Encounter (Signed)
Medication refill  Oxycodone 5mg 

## 2018-02-21 NOTE — Telephone Encounter (Signed)
Pt's girlfriend called and advised that the pt needs his pain medication and that she is afraid that the holidays will come and the pt is going to be in pain through the holiday and wants to know when he can have the medication refilled. Advised I will speak with Dr. Sharol Given and call tomorrow but in the meanwhile appt has been made for Wednesday for eval.

## 2018-02-22 ENCOUNTER — Telehealth (INDEPENDENT_AMBULATORY_CARE_PROVIDER_SITE_OTHER): Payer: Self-pay

## 2018-02-22 ENCOUNTER — Other Ambulatory Visit (INDEPENDENT_AMBULATORY_CARE_PROVIDER_SITE_OTHER): Payer: Self-pay

## 2018-02-22 ENCOUNTER — Telehealth (INDEPENDENT_AMBULATORY_CARE_PROVIDER_SITE_OTHER): Payer: Self-pay | Admitting: Orthopedic Surgery

## 2018-02-22 MED ORDER — HYDROCODONE-ACETAMINOPHEN 5-325 MG PO TABS: 1.0000 | ORAL_TABLET | Freq: Four times a day (QID) | ORAL | 0 refills | Status: DC | PRN

## 2018-02-22 NOTE — Telephone Encounter (Signed)
Pt walked in the office today full weight bearing. He was coming in to pick up an rx but had advised this morning that he was not able to arrange transportation for an office visit. Pt walked in without complaint again full weight bearing despite being advised not to do so multiple times.

## 2018-02-22 NOTE — Telephone Encounter (Signed)
I will sign off on this message. I have a duplicate in box. Will hold that message to discuss with Dr. Sharol Given.

## 2018-02-22 NOTE — Telephone Encounter (Signed)
sw Dr. Sharol Given and he advised a 1 time rx for Vicodin 5/325 #20 and the pt must keep appt 03/14/18. Called to advise. sw carol she voiced understanding and will call with questions.

## 2018-02-22 NOTE — Telephone Encounter (Signed)
Patient's girlfriend called yesterday last night and stated that she could not get transportation for him to be here on Wednesday.  CB#[phone].  Thank you.

## 2018-02-23 ENCOUNTER — Ambulatory Visit (INDEPENDENT_AMBULATORY_CARE_PROVIDER_SITE_OTHER): Payer: Medicare Other | Admitting: Family

## 2018-02-28 ENCOUNTER — Other Ambulatory Visit: Payer: Self-pay | Admitting: Family Medicine

## 2018-03-03 ENCOUNTER — Ambulatory Visit (INDEPENDENT_AMBULATORY_CARE_PROVIDER_SITE_OTHER): Payer: Medicare Other | Admitting: Orthopedic Surgery

## 2018-03-08 ENCOUNTER — Encounter: Payer: Self-pay | Admitting: Infectious Diseases

## 2018-03-08 ENCOUNTER — Ambulatory Visit (INDEPENDENT_AMBULATORY_CARE_PROVIDER_SITE_OTHER): Payer: Medicare Other | Admitting: Infectious Diseases

## 2018-03-08 VITALS — BP 127/86 | HR 105 | Ht 69.0 in | Wt 208.0 lb

## 2018-03-08 DIAGNOSIS — M19172 Post-traumatic osteoarthritis, left ankle and foot: Secondary | ICD-10-CM | POA: Diagnosis not present

## 2018-03-08 DIAGNOSIS — T847XXA Infection and inflammatory reaction due to other internal orthopedic prosthetic devices, implants and grafts, initial encounter: Secondary | ICD-10-CM

## 2018-03-08 DIAGNOSIS — Z5181 Encounter for therapeutic drug level monitoring: Secondary | ICD-10-CM

## 2018-03-08 DIAGNOSIS — T847XXD Infection and inflammatory reaction due to other internal orthopedic prosthetic devices, implants and grafts, subsequent encounter: Secondary | ICD-10-CM

## 2018-03-08 DIAGNOSIS — M869 Osteomyelitis, unspecified: Secondary | ICD-10-CM | POA: Diagnosis not present

## 2018-03-08 DIAGNOSIS — X58XXXS Exposure to other specified factors, sequela: Secondary | ICD-10-CM

## 2018-03-08 DIAGNOSIS — S82892S Other fracture of left lower leg, sequela: Secondary | ICD-10-CM

## 2018-03-08 DIAGNOSIS — B9689 Other specified bacterial agents as the cause of diseases classified elsewhere: Secondary | ICD-10-CM

## 2018-03-08 DIAGNOSIS — F17210 Nicotine dependence, cigarettes, uncomplicated: Secondary | ICD-10-CM

## 2018-03-08 DIAGNOSIS — Z792 Long term (current) use of antibiotics: Secondary | ICD-10-CM

## 2018-03-08 NOTE — Assessment & Plan Note (Signed)
Inflammatory markers resolving last visit - will repeat CRP/ESR/CBC and BMP for medication monitoring on bactrim (he did not schedule lab follow up as previously requested to monitor creatinine.)

## 2018-03-08 NOTE — Patient Instructions (Signed)
Continue taking your bactrim until gone.   Will have you come back in 4 weeks to check in.   Please try to stay off your ankle - Dr. Sharol Given wants you to be non weight bearing; this means you should not be putting any weight down on your ankle.

## 2018-03-08 NOTE — Progress Notes (Signed)
Patient: [name]  DOB: 1965/10/12 MRN: [id number] PCP: Lovena Neighbours, MD  Referring Provider: hospital follow up  Orthopedic Surgeon: Lajoyce Corners   Patient Active Problem List   Diagnosis Date Noted  . Medication monitoring encounter 02/09/2018  . Hardware complicating wound infection (HCC) 01/31/2018  . Spondylolisthesis, lumbar region 12/30/2017  . S/P ankle fusion 10/20/2017  . Screening for colon cancer 06/29/2017  . Seasonal allergies 06/29/2017  . Post-traumatic osteoarthritis, left ankle and foot 01/07/2017  . Presence of right artificial knee joint 12/17/2016  . Primary osteoarthritis of right knee 05/27/2016  . GERD (gastroesophageal reflux disease) 07/09/2015  . Mild persistent asthma 07/09/2015  . Type 2 diabetes mellitus (HCC) 07/09/2015  . Polyp of duodenum 06/04/2015  . Upper GI bleed 02/18/2015  . Benign essential HTN 02/18/2015  . Anemia of chronic disease 02/18/2015  . Pulmonary nodule 02/18/2015  . Cocaine abuse (HCC) 03/02/2014  . Posttraumatic stress disorder 03/02/2014  . History of CVA (cerebrovascular accident) 01/12/2014  . Schizophrenia (HCC) 01/12/2014  . Tobacco abuse 12/06/2013  . Tobacco dependence syndrome 12/06/2013     Subjective:  CC:  [name] is a 52 y.o. man here today for follow up on wound infection after recent hospitalization.   HPI:  In July 2019 [place] underwent internal fixation and fusion of the left ankle due to post-traumatic arthritis. He fell and re-injured this ankle in August but seemed to be healing up well until late August when he experienced fever to 59 F; xray revealed stable fusion/hardware and no external evidence of infection. Larey Seat again October 23rd and felt a pop noise. He subsequently developed a boil on the lateral side of his left ankle leading to readmission on 11/01 and concern for deeper infection. Hardware/screws were removed by Dr. Lajoyce Corners on 11/02. Intra-op cultures were negative; office  cultures from outpatient evaluation with Dr. Lajoyce Corners prior to hospitalization revealed enterobacter cloacea. He was transitioned to Bactrim DS 2 tabs BID with intention of continuing treatment.   HPI:  He has continued his bactrim 2 pills twice a day - "has a few left" but tolerating the medication w/o side effect. He is still worried about his ankle with regards to pain/swelling and is requesting more pain medication today. He denies any fevers, chills or drainage. No increased pain but persistent on left lateral malleolus. He does not use a cane regularly and does not know what "non-weight bearing means." He elevates his legs often to try to help with the swelling. Sees Dr Lajoyce Corners next week. Their team attempted to cast him however d/t swelling it was removed and he declined further cast attempt.   Review of Systems  Constitutional: Negative for chills and fever.  HENT: Negative for tinnitus.   Eyes: Negative for blurred vision and photophobia.  Respiratory: Negative for cough and sputum production.   Cardiovascular: Positive for leg swelling (L ankle as above). Negative for chest pain.  Gastrointestinal: Negative for diarrhea, nausea and vomiting.  Genitourinary: Negative for dysuria.  Musculoskeletal: Positive for joint pain (L ankle as above).  Skin: Negative for rash.  Neurological: Negative for weakness and headaches.    Past Medical History:  Diagnosis Date  . Anxiety   . Arthritis   . Asthma   . Duodenal adenoma   . GERD (gastroesophageal reflux disease)   . Hypertension   . Pneumonia    hx  . Post-traumatic osteoarthritis of left ankle   . Pre-diabetes   . Schizophrenia (HCC)   .  Stroke [facility])    2009-or10    Outpatient Medications Prior to Visit  Medication Sig Dispense Refill  . albuterol (PROVENTIL HFA;VENTOLIN HFA) 108 (90 Base) MCG/ACT inhaler Inhale 1 puff into the lungs 2 (two) times daily as needed for wheezing or shortness of breath. 1 Inhaler 1  . amLODipine  (NORVASC) 2.5 MG tablet Take 1 tablet (2.5 mg total) by mouth daily. 30 tablet 0  . benztropine (COGENTIN) 1 MG tablet Take 1 mg by mouth daily.    . bisacodyl (DULCOLAX) 10 MG suppository Place 1 suppository (10 mg total) rectally daily as needed for moderate constipation. 12 suppository 0  . buPROPion (WELLBUTRIN XL) 150 MG 24 hr tablet TAKE 1 TABLET(150 MG) BY MOUTH DAILY (Patient taking differently: Take 150 mg by mouth daily. ) 30 tablet 2  . cetirizine (ZYRTEC) 10 MG tablet Take 1 tablet (10 mg total) by mouth daily. 30 tablet 11  . docusate sodium (COLACE) 100 MG capsule Take 1 capsule (100 mg total) by mouth 2 (two) times daily. 10 capsule 0  . HYDROcodone-acetaminophen (NORCO/VICODIN) 5-325 MG tablet Take 1 tablet by mouth every 6 (six) hours as needed for moderate pain (prn severe pain). 20 tablet 0  . INVEGA TRINZA 819 MG/2.625ML SUSY Inject 819 mg into the skin every 3 (three) months.  3  . losartan (COZAAR) 50 MG tablet TAKE 1 TABLET BY MOUTH ONCE DAILY. (Patient taking differently: Take 50 mg by mouth daily. ) 28 tablet 5  . mirtazapine (REMERON) 15 MG tablet Take 15 mg by mouth at bedtime.    . Multiple Vitamins-Minerals (VITRUM SENIOR) TABS Take 1 tablet by mouth daily.     Marland Kitchen omeprazole (PRILOSEC) 40 MG capsule Take 1 capsule (40 mg total) by mouth daily. 30 capsule 11  . oxyCODONE (OXY IR/ROXICODONE) 5 MG immediate release tablet Take 1-2 tablets (5-10 mg total) by mouth every 4 (four) hours as needed for moderate pain or severe pain (pain score 4-6). 30 tablet 0  . polyethylene glycol (MIRALAX / GLYCOLAX) packet Take 17 g by mouth daily as needed for mild constipation. 14 each 0  . sucralfate (CARAFATE) 1 g tablet TAKE 1 TABLET FOUR TIMES DAILY FOR ACID REFLUX. (Patient taking differently: Take 1 g by mouth 4 (four) times daily. ) 112 tablet 0  . sucralfate (CARAFATE) 1 g tablet TAKE 1 TABLET FOUR TIMES DAILY FOR ACID REFLUX. 112 tablet 0  . sucralfate (CARAFATE) 1 g tablet TAKE 1  TABLET FOUR TIMES DAILY FOR ACID REFLUX. 112 tablet 0  . sulfamethoxazole-trimethoprim (BACTRIM DS,SEPTRA DS) 800-160 MG tablet Take 2 tablets by mouth 2 (two) times daily. 120 tablet 1   No facility-administered medications prior to visit.      No Known Allergies  Social History   Tobacco Use  . Smoking status: Heavy Tobacco Smoker    Packs/day: 1.50    Years: 38.00    Pack years: 57.00    Types: Cigarettes  . Smokeless tobacco: Never Used  . Tobacco comment: onset age 10; upto 1.5 ppd  Substance Use Topics  . Alcohol use: Not Currently    Alcohol/week: 3.0 standard drinks    Types: 3 Cans of beer per week    Comment: quit 2015; formerly up to 2 fifths/day  . Drug use: No    Objective:   Vitals:   03/08/18 1142  BP: 127/86  Pulse: (!) 105  Weight: 208 lb (94.3 kg)  Height: 5\' 9"  (1.753 m)   Body mass index  is 30.72 kg/m.  Physical Exam  Constitutional: He is oriented to person, place, and time. He appears well-developed and well-nourished.  Seated comfortably in chair today with his wife. He is non-toxic appearing.   HENT:  Mouth/Throat: Oropharynx is clear and moist. No oral lesions. Normal dentition. No dental caries.  Eyes: Pupils are equal, round, and reactive to light. No scleral icterus.  Cardiovascular: Normal rate, regular rhythm, normal heart sounds and intact distal pulses.  Pulmonary/Chest: Effort normal and breath sounds normal.  Musculoskeletal:  L ankle cool to the touch with well healed surgical incisions medially and laterally. No drainage. No warmth/erythema. Joint is swollen circumferentially but particular tenderness to left lateral malleolar area.   Lymphadenopathy:    He has no cervical adenopathy.  Neurological: He is alert and oriented to person, place, and time.  Skin: Skin is warm and dry. No rash noted.    Lab Results: Lab Results  Component Value Date   WBC 12.5 (H) 02/09/2018   HGB 11.8 (L) 02/09/2018   HCT 35.5 (L) 02/09/2018    MCV 79.2 (L) 02/09/2018   PLT 559 (H) 02/09/2018    Lab Results  Component Value Date   CREATININE 1.33 02/09/2018   BUN 9 02/09/2018   NA 133 (L) 02/09/2018   K 4.4 02/09/2018   CL 99 02/09/2018   CO2 26 02/09/2018    Lab Results  Component Value Date   ALT 8 01/28/2018   AST 18 01/28/2018   ALKPHOS 99 01/28/2018   BILITOT 0.6 01/28/2018     Assessment & Plan:   Problem List Items Addressed This Visit      Unprioritized   Hardware complicating wound infection (HCC) - Primary    He has completed 5 weeks 3 days of therapy since 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 He has frequently been reminded about need to be non-weight bearing. I explained to him that even the use of a cane was not enough to meet this criteria and he should likely be on crutches; however he walked in today full weight bearing w/o walking aid. His ankle continues to be swollen as it was before but all incision sites are well healed. No drainage or areas of fluctuance. No significant warmth at the joint. Finish antibiotics for 6 weeks of treatment and will have him return in 4 weeks to check in off antibiotics again.  He has appt with ortho next week for follow up with xrays.       Medication monitoring encounter    Inflammatory markers resolving last visit - will repeat CRP/ESR/CBC and BMP for medication monitoring on bactrim (he did not schedule lab follow up as previously requested to monitor creatinine.)       Relevant Orders   C-reactive protein   Sedimentation rate   Basic metabolic panel   CBC      Rexene Alberts, MSN, NP-C [facility] Pager: [phone] Office: [phone]  03/08/18  1:30 PM

## 2018-03-08 NOTE — Assessment & Plan Note (Addendum)
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 He has frequently been reminded about need to be non-weight bearing. I explained to him that even the use of a cane was not enough to meet this criteria and he should likely be on crutches; however he walked in today full weight bearing w/o walking aid. His ankle continues to be swollen as it was before but all incision sites are well healed. No drainage or areas of fluctuance. No significant warmth at the joint. Finish antibiotics for 6 weeks of treatment and will have him return in 4 weeks to check in off antibiotics again.  He has appt with ortho next week for follow up with xrays.

## 2018-03-09 LAB — BASIC METABOLIC PANEL
BUN: 9 mg/dL (ref 7–25)
CALCIUM: 9 mg/dL (ref 8.6–10.3)
CHLORIDE: 101 mmol/L (ref 98–110)
CO2: 26 mmol/L (ref 20–32)
CREATININE: 1.18 mg/dL (ref 0.70–1.33)
Glucose, Bld: 122 mg/dL — ABNORMAL HIGH (ref 65–99)
POTASSIUM: 4.2 mmol/L (ref 3.5–5.3)
SODIUM: 135 mmol/L (ref 135–146)

## 2018-03-09 LAB — CBC
HCT: 34.1 % — ABNORMAL LOW (ref 38.5–50.0)
Hemoglobin: 11.2 g/dL — ABNORMAL LOW (ref 13.2–17.1)
MCH: 27 pg (ref 27.0–33.0)
MCHC: 32.8 g/dL (ref 32.0–36.0)
MCV: 82.2 fL (ref 80.0–100.0)
MPV: 10.1 fL (ref 7.5–12.5)
PLATELETS: 344 10*3/uL (ref 140–400)
RBC: 4.15 10*6/uL — ABNORMAL LOW (ref 4.20–5.80)
RDW: 15.1 % — AB (ref 11.0–15.0)
WBC: 6.6 10*3/uL (ref 3.8–10.8)

## 2018-03-09 LAB — SEDIMENTATION RATE: Sed Rate: 9 mm/h (ref 0–20)

## 2018-03-09 LAB — C-REACTIVE PROTEIN: CRP: 56.5 mg/L — ABNORMAL HIGH (ref <8.0)

## 2018-03-14 ENCOUNTER — Encounter (INDEPENDENT_AMBULATORY_CARE_PROVIDER_SITE_OTHER): Payer: Self-pay | Admitting: Orthopedic Surgery

## 2018-03-14 ENCOUNTER — Ambulatory Visit (INDEPENDENT_AMBULATORY_CARE_PROVIDER_SITE_OTHER): Payer: Medicare Other | Admitting: Orthopedic Surgery

## 2018-03-14 VITALS — Ht 69.0 in | Wt 208.0 lb

## 2018-03-14 DIAGNOSIS — T847XXD Infection and inflammatory reaction due to other internal orthopedic prosthetic devices, implants and grafts, subsequent encounter: Secondary | ICD-10-CM

## 2018-03-14 MED ORDER — HYDROCODONE-ACETAMINOPHEN 5-325 MG PO TABS: 1.0000 | ORAL_TABLET | Freq: Four times a day (QID) | ORAL | 0 refills | Status: DC | PRN

## 2018-03-14 NOTE — Progress Notes (Signed)
Office Visit Note   Patient: [name]           Date of Birth: [birth year]           MRN: [id number] Visit Date: 03/14/2018              Requested by: Lovena Neighbours, MD [address] [place], [place] [address] PCP: Lovena Neighbours, MD  Chief Complaint  Patient presents with  . Left Ankle - Pain, Follow-up      HPI: Patient is a 52 year old gentleman status post removal of deep retained hardware from infection from ankle fusion.  Patient complains of persistent ankle pain.  He has been followed closely by infectious disease and has improving inflammatory markers.  Patient denies any redness or cellulitis or open wounds.  Assessment & Plan: Visit Diagnoses:  1. Hardware complicating wound infection, subsequent encounter     Plan: Patient will be placed in a short leg cast to decrease the microscopic motion he will use the fracture boot on top of the cast.  Follow-up in 4 weeks with repeat radiographs of the left ankle.  Follow-Up Instructions: Return in about 4 weeks (around 04/11/2018).   Ortho Exam  Patient is alert, oriented, no adenopathy, well-dressed, normal affect, normal respiratory effort. Examination patient has no cellulitis no swelling no signs of infection.  The surgical incisions are well-healed.  Patient's previous radiographs shows good consolidation of the fusion site the tracks where the screws were in place has filled in with bone.  Imaging: No results found. No images are attached to the encounter.  Labs: Lab Results  Component Value Date   HGBA1C 5.6 11/05/2016   HGBA1C 6.0 (H) 07/10/2016   HGBA1C 5.8 04/10/2016   ESRSEDRATE 9 03/08/2018   ESRSEDRATE 28 (H) 02/09/2018   ESRSEDRATE 94 (H) 01/28/2018   CRP 56.5 (H) 03/08/2018   CRP 9.0 (H) 02/09/2018   CRP 12.6 (H) 01/28/2018   LABURIC 5.2 04/17/2014   REPTSTATUS 02/03/2018 FINAL 01/29/2018   GRAMSTAIN  01/29/2018    MODERATE WBC PRESENT,BOTH PMN AND MONONUCLEAR NO ORGANISMS SEEN    CULT  01/29/2018    RARE ENTEROBACTER CLOACAE CRITICAL RESULT CALLED TO, READ BACK BY AND VERIFIED WITH: Durwin Nora RN, AT 1156 01/31/18 BY D. Leighton Roach REGARDING CULTURE GROWTH NO ANAEROBES ISOLATED Performed at [facility], [address] [place], [place] [address]    LABORGA ENTEROBACTER CLOACAE 01/29/2018     Lab Results  Component Value Date   ALBUMIN 3.4 (L) 01/28/2018   ALBUMIN 4.3 06/29/2017   ALBUMIN 4.1 04/28/2017   LABURIC 5.2 04/17/2014    Body mass index is 30.72 kg/m.  Orders:  No orders of the defined types were placed in this encounter.  Meds ordered this encounter  Medications  . HYDROcodone-acetaminophen (NORCO/VICODIN) 5-325 MG tablet    Sig: Take 1 tablet by mouth every 6 (six) hours as needed for moderate pain (prn severe pain).    Dispense:  20 tablet    Refill:  0     Procedures: No procedures performed  Clinical Data: No additional findings.  ROS:  All other systems negative, except as noted in the HPI. Review of Systems  Objective: Vital Signs: Ht 5\' 9"  (1.753 m)   Wt 208 lb (94.3 kg)   BMI 30.72 kg/m   Specialty Comments:  No specialty comments available.  PMFS History: Patient Active Problem List   Diagnosis Date Noted  . Medication monitoring encounter 02/09/2018  . Hardware complicating  wound infection (HCC) 01/31/2018  . Spondylolisthesis, lumbar region 12/30/2017  . S/P ankle fusion 10/20/2017  . Screening for colon cancer 06/29/2017  . Seasonal allergies 06/29/2017  . Post-traumatic osteoarthritis, left ankle and foot 01/07/2017  . Presence of right artificial knee joint 12/17/2016  . Primary osteoarthritis of right knee 05/27/2016  . GERD (gastroesophageal reflux disease) 07/09/2015  . Mild persistent asthma 07/09/2015  . Type 2 diabetes mellitus (HCC) 07/09/2015  . Polyp of duodenum 06/04/2015  . Upper GI bleed 02/18/2015  . Benign essential HTN 02/18/2015  . Anemia of chronic disease 02/18/2015  . Pulmonary  nodule 02/18/2015  . Cocaine abuse (HCC) 03/02/2014  . Posttraumatic stress disorder 03/02/2014  . History of CVA (cerebrovascular accident) 01/12/2014  . Schizophrenia (HCC) 01/12/2014  . Tobacco abuse 12/06/2013  . Tobacco dependence syndrome 12/06/2013   Past Medical History:  Diagnosis Date  . Anxiety   . Arthritis   . Asthma   . Duodenal adenoma   . GERD (gastroesophageal reflux disease)   . Hypertension   . Pneumonia    hx  . Post-traumatic osteoarthritis of left ankle   . Pre-diabetes   . Schizophrenia (HCC)   . Stroke [facility])    2009-or10    Family History  Adopted: Yes  Problem Relation Age of Onset  . Colon cancer Neg Hx   . Esophageal cancer Neg Hx   . Rectal cancer Neg Hx   . Stomach cancer Neg Hx     Past Surgical History:  Procedure Laterality Date  . ANKLE ARTHROSCOPY Left 10/20/2017   Procedure: ANKLE ARTHROSCOPY;  Surgeon: Nadara Mustard, MD;  Location: [facility] OR;  Service: Orthopedics;  Laterality: Left;  . ANKLE FUSION Left 10/20/2017  . ANKLE FUSION Left 10/20/2017   Procedure: LEFT ANKLE FUSION;  Surgeon: Nadara Mustard, MD;  Location: [facility] OR;  Service: Orthopedics;  Laterality: Left;  . ESOPHAGOGASTRODUODENOSCOPY Left 12/05/2013   Procedure: ESOPHAGOGASTRODUODENOSCOPY (EGD);  Surgeon: Willis Modena, MD;  Location: [facility];  Service: Endoscopy;  Laterality: Left;  . ESOPHAGOGASTRODUODENOSCOPY (EGD) WITH PROPOFOL N/A 02/20/2015   Procedure: ESOPHAGOGASTRODUODENOSCOPY (EGD) WITH PROPOFOL;  Surgeon: Charlott Rakes, MD;  Location: [facility]COPY;  Service: Endoscopy;  Laterality: N/A;  . HARDWARE REMOVAL Left 01/29/2018   Procedure: HARDWARE REMOVAL LEFT ANKLE;  Surgeon: Nadara Mustard, MD;  Location: [facility] OR;  Service: Orthopedics;  Laterality: Left;  . TOTAL KNEE ARTHROPLASTY Right 07/21/2016   Procedure: TOTAL KNEE ARTHROPLASTY;  Surgeon: Cammy Copa, MD;  Location: [facility] OR;  Service: Orthopedics;  Laterality: Right;  . UPPER GASTROINTESTINAL ENDOSCOPY      Social History   Occupational History  . Not on file  Tobacco Use  . Smoking status: Heavy Tobacco Smoker    Packs/day: 1.50    Years: 38.00    Pack years: 57.00    Types: Cigarettes  . Smokeless tobacco: Never Used  . Tobacco comment: onset age 22; upto 1.5 ppd  Substance and Sexual Activity  . Alcohol use: Not Currently    Alcohol/week: 3.0 standard drinks    Types: 3 Cans of beer per week    Comment: quit 2015; formerly up to 2 fifths/day  . Drug use: No  . Sexual activity: Yes    Comment: declined condoms

## 2018-03-16 ENCOUNTER — Telehealth (INDEPENDENT_AMBULATORY_CARE_PROVIDER_SITE_OTHER): Payer: Self-pay | Admitting: *Deleted

## 2018-03-16 NOTE — Telephone Encounter (Signed)
I called pt and tried to make appt for pt to come in today and he declined. He declined Thursday as well. Appt made for Friday. Will hold message to discuss medication with Dr. Sharol Given upon return to the office tomorrow.

## 2018-03-18 ENCOUNTER — Encounter (INDEPENDENT_AMBULATORY_CARE_PROVIDER_SITE_OTHER): Payer: Self-pay | Admitting: Physician Assistant

## 2018-03-18 ENCOUNTER — Telehealth (INDEPENDENT_AMBULATORY_CARE_PROVIDER_SITE_OTHER): Payer: Self-pay | Admitting: Physician Assistant

## 2018-03-18 ENCOUNTER — Ambulatory Visit (INDEPENDENT_AMBULATORY_CARE_PROVIDER_SITE_OTHER): Payer: Medicare Other | Admitting: Physician Assistant

## 2018-03-18 VITALS — Ht 69.0 in | Wt 208.0 lb

## 2018-03-18 DIAGNOSIS — T847XXD Infection and inflammatory reaction due to other internal orthopedic prosthetic devices, implants and grafts, subsequent encounter: Secondary | ICD-10-CM

## 2018-03-18 DIAGNOSIS — Z9889 Other specified postprocedural states: Secondary | ICD-10-CM

## 2018-03-18 DIAGNOSIS — M25572 Pain in left ankle and joints of left foot: Secondary | ICD-10-CM

## 2018-03-18 NOTE — Telephone Encounter (Signed)
Pt called requesting oxycodone  Instead of hydrocodone

## 2018-03-18 NOTE — Telephone Encounter (Signed)
I called patient and advised him that he was just in the office and his Rx was filled on 03/14/18 and there was nothing we could do at this time. I advised him that he needs to be complaint with Dr Jess Barters orders and we will have to  eventually decrease on his medication soon and if he is still in tremendous pain we can send him to pain management. Patient understood.

## 2018-03-18 NOTE — Telephone Encounter (Signed)
Pt coming in for an appt with PA today per Dr. Sharol Given no changes in pain medication and to remove cast and apply fx boot.

## 2018-03-18 NOTE — Progress Notes (Signed)
Office Visit Note   Patient: [name]           Date of Birth: 1965/09/16           MRN: [id number] Visit Date: 03/18/2018              Requested by: Lovena Neighbours, MD [address] [place], [place] [address] PCP: Lovena Neighbours, MD  Chief Complaint  Patient presents with  . Left Ankle - Pain      HPI: The patient is a 52 yo gentleman who returns today with complaints of severe pain over the left foot following placement of a short leg cast earlier this week. He is status post removal of deep retained hardware from infection following any ankle fusion.  He would like the cast removed. He is walking on the cast coming into the office, without his fracture boot and using a cane only. He reports he does have crutches at home and is willing to use these and not put weight on the left foot.   Assessment & Plan: Visit Diagnoses:  1. Hardware complicating wound infection, subsequent encounter   2. Pain in left ankle and joints of left foot   3. History of removal of retained hardware     Plan: The cast was removed today and the patient was placed in a fracture boot and instructed to not weight bear and use crutches for mobility. Will plan to see him back in several weeks for repeat radiographs of the left ankle at that time.   Follow-Up Instructions: Return in about 24 days (around 04/11/2018).   Ortho Exam  Patient is alert, oriented, no adenopathy, well-dressed, normal affect, normal respiratory effort. Left 4th toe with small superficial popped blister over the dorsum where the patient has gotten pressure/friction due to walking in his cast. No signs of infection. Pulses intact.  Patient was placed in fracture boot and instructed to use crutches and not weight bear on left foot.  Imaging: No results found. No images are attached to the encounter.  Labs: Lab Results  Component Value Date   HGBA1C 5.6 11/05/2016   HGBA1C 6.0 (H) 07/10/2016   HGBA1C 5.8 04/10/2016     ESRSEDRATE 9 03/08/2018   ESRSEDRATE 28 (H) 02/09/2018   ESRSEDRATE 94 (H) 01/28/2018   CRP 56.5 (H) 03/08/2018   CRP 9.0 (H) 02/09/2018   CRP 12.6 (H) 01/28/2018   LABURIC 5.2 04/17/2014   REPTSTATUS 02/03/2018 FINAL 01/29/2018   GRAMSTAIN  01/29/2018    MODERATE WBC PRESENT,BOTH PMN AND MONONUCLEAR NO ORGANISMS SEEN    CULT  01/29/2018    RARE ENTEROBACTER CLOACAE CRITICAL RESULT CALLED TO, READ BACK BY AND VERIFIED WITH: Durwin Nora RN, AT 1156 01/31/18 BY D. Leighton Roach REGARDING CULTURE GROWTH NO ANAEROBES ISOLATED Performed at [facility], [address], [place], [place] [address]    LABORGA ENTEROBACTER CLOACAE 01/29/2018     Lab Results  Component Value Date   ALBUMIN 3.4 (L) 01/28/2018   ALBUMIN 4.3 06/29/2017   ALBUMIN 4.1 04/28/2017   LABURIC 5.2 04/17/2014    Body mass index is 30.72 kg/m.  Orders:  No orders of the defined types were placed in this encounter.  No orders of the defined types were placed in this encounter.    Procedures: No procedures performed  Clinical Data: No additional findings.  ROS:  All other systems negative, except as noted in the HPI. Review of Systems  Objective: Vital Signs: Ht 5\' 9"  (  1.753 m)   Wt 208 lb (94.3 kg)   BMI 30.72 kg/m   Specialty Comments:  No specialty comments available.  PMFS History: Patient Active Problem List   Diagnosis Date Noted  . Medication monitoring encounter 02/09/2018  . Hardware complicating wound infection (HCC) 01/31/2018  . Spondylolisthesis, lumbar region 12/30/2017  . S/P ankle fusion 10/20/2017  . Screening for colon cancer 06/29/2017  . Seasonal allergies 06/29/2017  . Post-traumatic osteoarthritis, left ankle and foot 01/07/2017  . Presence of right artificial knee joint 12/17/2016  . Primary osteoarthritis of right knee 05/27/2016  . GERD (gastroesophageal reflux disease) 07/09/2015  . Mild persistent asthma 07/09/2015  . Type 2 diabetes mellitus (HCC)  07/09/2015  . Polyp of duodenum 06/04/2015  . Upper GI bleed 02/18/2015  . Benign essential HTN 02/18/2015  . Anemia of chronic disease 02/18/2015  . Pulmonary nodule 02/18/2015  . Cocaine abuse (HCC) 03/02/2014  . Posttraumatic stress disorder 03/02/2014  . History of CVA (cerebrovascular accident) 01/12/2014  . Schizophrenia (HCC) 01/12/2014  . Tobacco abuse 12/06/2013  . Tobacco dependence syndrome 12/06/2013   Past Medical History:  Diagnosis Date  . Anxiety   . Arthritis   . Asthma   . Duodenal adenoma   . GERD (gastroesophageal reflux disease)   . Hypertension   . Pneumonia    hx  . Post-traumatic osteoarthritis of left ankle   . Pre-diabetes   . Schizophrenia (HCC)   . Stroke [facility])    2009-or10    Family History  Adopted: Yes  Problem Relation Age of Onset  . Colon cancer Neg Hx   . Esophageal cancer Neg Hx   . Rectal cancer Neg Hx   . Stomach cancer Neg Hx     Past Surgical History:  Procedure Laterality Date  . ANKLE ARTHROSCOPY Left 10/20/2017   Procedure: ANKLE ARTHROSCOPY;  Surgeon: Nadara Mustard, MD;  Location: [facility] OR;  Service: Orthopedics;  Laterality: Left;  . ANKLE FUSION Left 10/20/2017  . ANKLE FUSION Left 10/20/2017   Procedure: LEFT ANKLE FUSION;  Surgeon: Nadara Mustard, MD;  Location: [facility] OR;  Service: Orthopedics;  Laterality: Left;  . ESOPHAGOGASTRODUODENOSCOPY Left 12/05/2013   Procedure: ESOPHAGOGASTRODUODENOSCOPY (EGD);  Surgeon: Willis Modena, MD;  Location: [facility];  Service: Endoscopy;  Laterality: Left;  . ESOPHAGOGASTRODUODENOSCOPY (EGD) WITH PROPOFOL N/A 02/20/2015   Procedure: ESOPHAGOGASTRODUODENOSCOPY (EGD) WITH PROPOFOL;  Surgeon: Charlott Rakes, MD;  Location: [facility];  Service: Endoscopy;  Laterality: N/A;  . HARDWARE REMOVAL Left 01/29/2018   Procedure: HARDWARE REMOVAL LEFT ANKLE;  Surgeon: Nadara Mustard, MD;  Location: [facility] OR;  Service: Orthopedics;  Laterality: Left;  . TOTAL KNEE ARTHROPLASTY Right 07/21/2016    Procedure: TOTAL KNEE ARTHROPLASTY;  Surgeon: Cammy Copa, MD;  Location: [facility] OR;  Service: Orthopedics;  Laterality: Right;  . UPPER GASTROINTESTINAL ENDOSCOPY     Social History   Occupational History  . Not on file  Tobacco Use  . Smoking status: Heavy Tobacco Smoker    Packs/day: 1.50    Years: 38.00    Pack years: 57.00    Types: Cigarettes  . Smokeless tobacco: Never Used  . Tobacco comment: onset age 67; upto 1.5 ppd  Substance and Sexual Activity  . Alcohol use: Not Currently    Alcohol/week: 3.0 standard drinks    Types: 3 Cans of beer per week    Comment: quit 2015; formerly up to 2 fifths/day  . Drug use: No  . Sexual activity: Yes  Comment: declined condoms

## 2018-03-28 ENCOUNTER — Other Ambulatory Visit: Payer: Self-pay | Admitting: Family Medicine

## 2018-04-05 ENCOUNTER — Ambulatory Visit: Payer: Medicare Other | Admitting: Infectious Diseases

## 2018-04-11 ENCOUNTER — Ambulatory Visit (INDEPENDENT_AMBULATORY_CARE_PROVIDER_SITE_OTHER): Payer: Medicare Other | Admitting: Orthopedic Surgery

## 2018-04-20 ENCOUNTER — Ambulatory Visit (INDEPENDENT_AMBULATORY_CARE_PROVIDER_SITE_OTHER): Payer: Medicare Other | Admitting: Family

## 2018-04-25 ENCOUNTER — Ambulatory Visit (INDEPENDENT_AMBULATORY_CARE_PROVIDER_SITE_OTHER): Payer: Medicare Other | Admitting: Orthopedic Surgery

## 2018-05-10 ENCOUNTER — Telehealth (INDEPENDENT_AMBULATORY_CARE_PROVIDER_SITE_OTHER): Payer: Self-pay

## 2018-05-10 NOTE — Telephone Encounter (Signed)
Pt advised that he is not able to come into the office and he states that he will go to the ER. I will hold this message and follow up with pt tomorrow for outcome or to encourage to come in for appt. Pt was offered appts for every day this week.

## 2018-05-10 NOTE — Telephone Encounter (Signed)
Patient called stating that he has been having chills for a couple of weeks and his left ankle is swollen and painful.  Stated that he has been using ice and elevating.  Wanted to know if it is something else he can do?  Sated that he wants to make sure that it's not an infection.   Stated that if he can get a ride he can come to the office today.  Patient will call back to let us know.

## 2018-05-10 NOTE — Telephone Encounter (Signed)
Patient called back and I advised him of message in chart per Autumn, Dr. Jess Barters assistant.  Patient stated that he may not have a way to get to the office this week and stated that he would go to the ED by ambulance.  Patient has been offered several appointments to be seen.   Advised patient that if he chooses to go to the ED, then that would be okay, especially if he has been having chills, fever, and pain and If he thinks he has an infection and can not make it to the office. Patient stated that he would make some calls and give [place] a call back.

## 2018-05-10 NOTE — Telephone Encounter (Signed)
Called pt and lm on vm to advise that we will need to see him in the office and that he can come today at 4pm or tomorrow on erin's sch first thing in the morning or with Dr. Sharol Given that afternoon at 2:30 we do need to see him this week if he thinks that his ankle is infected.  Will hold message to follow up with pt.

## 2018-05-11 ENCOUNTER — Telehealth (INDEPENDENT_AMBULATORY_CARE_PROVIDER_SITE_OTHER): Payer: Self-pay

## 2018-05-11 NOTE — Telephone Encounter (Signed)
Refer to notes. 

## 2018-05-11 NOTE — Telephone Encounter (Signed)
Spoke to pt and informed him that we can not give ABX over phone not knowing what the issue is and he has to come in and be seen in our office, patient stated that the swelling was better and has been putting ice on it and will try to manage until his appt next Thurs 05/19/2018. I informed him that if he can not make it in then he needs to go to ER if he believes he has an infection.

## 2018-05-11 NOTE — Telephone Encounter (Signed)
I called pt and lm on vm to advise that I do not see where he had gone to the hospital last night. I was calling to follow up on the call he placed yesterday. He believed his left ankle to be swollen, painful and possibly infected. He has had chills and was offered appts every day this week which he declined. He advised that he would go to the hospital and he did not. I called and advised that we are trying to help him that he does need to come in for eval and to call the office and make an appt.

## 2018-05-11 NOTE — Telephone Encounter (Signed)
Patient was returning your call.  Please CB at [phone].  Thank You.

## 2018-05-19 ENCOUNTER — Ambulatory Visit (INDEPENDENT_AMBULATORY_CARE_PROVIDER_SITE_OTHER): Payer: Medicare Other | Admitting: Orthopedic Surgery

## 2018-05-23 ENCOUNTER — Other Ambulatory Visit: Payer: Self-pay | Admitting: Family Medicine

## 2018-05-25 ENCOUNTER — Ambulatory Visit (INDEPENDENT_AMBULATORY_CARE_PROVIDER_SITE_OTHER): Payer: Medicare Other | Admitting: Orthopedic Surgery

## 2018-05-25 ENCOUNTER — Ambulatory Visit (INDEPENDENT_AMBULATORY_CARE_PROVIDER_SITE_OTHER): Payer: Medicare Other | Admitting: Family

## 2018-06-01 ENCOUNTER — Ambulatory Visit (INDEPENDENT_AMBULATORY_CARE_PROVIDER_SITE_OTHER): Payer: Medicare Other | Admitting: Family

## 2018-06-03 ENCOUNTER — Ambulatory Visit (INDEPENDENT_AMBULATORY_CARE_PROVIDER_SITE_OTHER): Payer: Medicare Other | Admitting: Physician Assistant

## 2018-06-03 ENCOUNTER — Encounter (INDEPENDENT_AMBULATORY_CARE_PROVIDER_SITE_OTHER): Payer: Self-pay | Admitting: Physician Assistant

## 2018-06-03 ENCOUNTER — Ambulatory Visit (INDEPENDENT_AMBULATORY_CARE_PROVIDER_SITE_OTHER): Payer: Medicare Other

## 2018-06-03 DIAGNOSIS — T847XXD Infection and inflammatory reaction due to other internal orthopedic prosthetic devices, implants and grafts, subsequent encounter: Secondary | ICD-10-CM

## 2018-06-03 MED ORDER — OXYCODONE-ACETAMINOPHEN 5-325 MG PO TABS: 1.0000 | ORAL_TABLET | Freq: Four times a day (QID) | ORAL | 0 refills | Status: DC | PRN

## 2018-06-03 MED ORDER — SULFAMETHOXAZOLE-TRIMETHOPRIM 800-160 MG PO TABS: 1.0000 | ORAL_TABLET | Freq: Two times a day (BID) | ORAL | 0 refills | Status: DC

## 2018-06-03 NOTE — Progress Notes (Addendum)
Office Visit Note   Patient: [name]           Date of Birth: 1965-11-24           MRN: [id number] Visit Date: 06/03/2018              Requested by: Lovena Neighbours, MD [address] [place], [place] [address] PCP: Lovena Neighbours, MD  Chief Complaint  Patient presents with  . Left Ankle - Follow-up      HPI: The patient is a 53 year old gentleman who presents for follow-up status post removal of deep retained hardware from an infection following an ankle fusion.  He returns reporting that he has been having persistent swelling about the ankle and severe pain.  He reports intermittent fever and chills but none today.  He has been walking in a fracture boot.  He reports that he has been unable to come back to the office due to transportation issues.  Assessment & Plan: Visit Diagnoses:  1. Hardware complicating wound infection, subsequent encounter     Plan: Patient discussed with Dr. Lajoyce Corners.   patient will be sent for MRI scanning to rule out recurrent infection or osteomyelitis.  Will start Bactrim DS 1 p.o. twice daily.  We also ordered a small amount of Percocet 5/325 mg as needed for pain.  The patient will follow-up following completion of the MRI scan.  Follow-Up Instructions: No follow-ups on file.   Ortho Exam  Patient is alert, oriented, no adenopathy, well-dressed, normal affect, normal respiratory effort. He has localized swelling about the lateral greater than the medial left ankle.  He has tenderness to palpation.  He has some mild localized erythema.  He has palpable dorsalis pedis pulse.  There are no open wounds or areas of ulceration.  He is ambulating with a fracture boot and crutches.    Imaging: No results found. No images are attached to the encounter.  Labs: Lab Results  Component Value Date   HGBA1C 5.6 11/05/2016   HGBA1C 6.0 (H) 07/10/2016   HGBA1C 5.8 04/10/2016   ESRSEDRATE 9 03/08/2018   ESRSEDRATE 28 (H) 02/09/2018   ESRSEDRATE 94  (H) 01/28/2018   CRP 56.5 (H) 03/08/2018   CRP 9.0 (H) 02/09/2018   CRP 12.6 (H) 01/28/2018   LABURIC 5.2 04/17/2014   REPTSTATUS 02/03/2018 FINAL 01/29/2018   GRAMSTAIN  01/29/2018    MODERATE WBC PRESENT,BOTH PMN AND MONONUCLEAR NO ORGANISMS SEEN    CULT  01/29/2018    RARE ENTEROBACTER CLOACAE CRITICAL RESULT CALLED TO, READ BACK BY AND VERIFIED WITH: Durwin Nora RN, AT 1156 01/31/18 BY D. Leighton Roach REGARDING CULTURE GROWTH NO ANAEROBES ISOLATED Performed at [facility], [address], [place], [place] [address]    LABORGA ENTEROBACTER CLOACAE 01/29/2018     Lab Results  Component Value Date   ALBUMIN 3.4 (L) 01/28/2018   ALBUMIN 4.3 06/29/2017   ALBUMIN 4.1 04/28/2017   LABURIC 5.2 04/17/2014    There is no height or weight on file to calculate BMI.  Orders:  Orders Placed This Encounter  Procedures  . XR Ankle Complete Left   No orders of the defined types were placed in this encounter.    Procedures: No procedures performed  Clinical Data: No additional findings.  ROS:  All other systems negative, except as noted in the HPI. Review of Systems  Objective: Vital Signs: There were no vitals taken for this visit.  Specialty Comments:  No specialty comments available.  PMFS  History: Patient Active Problem List   Diagnosis Date Noted  . Medication monitoring encounter 02/09/2018  . Hardware complicating wound infection (HCC) 01/31/2018  . Spondylolisthesis, lumbar region 12/30/2017  . S/P ankle fusion 10/20/2017  . Screening for colon cancer 06/29/2017  . Seasonal allergies 06/29/2017  . Post-traumatic osteoarthritis, left ankle and foot 01/07/2017  . Presence of right artificial knee joint 12/17/2016  . Primary osteoarthritis of right knee 05/27/2016  . GERD (gastroesophageal reflux disease) 07/09/2015  . Mild persistent asthma 07/09/2015  . Type 2 diabetes mellitus (HCC) 07/09/2015  . Polyp of duodenum 06/04/2015  . Upper GI bleed  02/18/2015  . Benign essential HTN 02/18/2015  . Anemia of chronic disease 02/18/2015  . Pulmonary nodule 02/18/2015  . Cocaine abuse (HCC) 03/02/2014  . Posttraumatic stress disorder 03/02/2014  . History of CVA (cerebrovascular accident) 01/12/2014  . Schizophrenia (HCC) 01/12/2014  . Tobacco abuse 12/06/2013  . Tobacco dependence syndrome 12/06/2013   Past Medical History:  Diagnosis Date  . Anxiety   . Arthritis   . Asthma   . Duodenal adenoma   . GERD (gastroesophageal reflux disease)   . Hypertension   . Pneumonia    hx  . Post-traumatic osteoarthritis of left ankle   . Pre-diabetes   . Schizophrenia (HCC)   . Stroke [facility])    2009-or10    Family History  Adopted: Yes  Problem Relation Age of Onset  . Colon cancer Neg Hx   . Esophageal cancer Neg Hx   . Rectal cancer Neg Hx   . Stomach cancer Neg Hx     Past Surgical History:  Procedure Laterality Date  . ANKLE ARTHROSCOPY Left 10/20/2017   Procedure: ANKLE ARTHROSCOPY;  Surgeon: Nadara Mustard, MD;  Location: [facility] OR;  Service: Orthopedics;  Laterality: Left;  . ANKLE FUSION Left 10/20/2017  . ANKLE FUSION Left 10/20/2017   Procedure: LEFT ANKLE FUSION;  Surgeon: Nadara Mustard, MD;  Location: [facility] OR;  Service: Orthopedics;  Laterality: Left;  . ESOPHAGOGASTRODUODENOSCOPY Left 12/05/2013   Procedure: ESOPHAGOGASTRODUODENOSCOPY (EGD);  Surgeon: Willis Modena, MD;  Location: [facility];  Service: Endoscopy;  Laterality: Left;  . ESOPHAGOGASTRODUODENOSCOPY (EGD) WITH PROPOFOL N/A 02/20/2015   Procedure: ESOPHAGOGASTRODUODENOSCOPY (EGD) WITH PROPOFOL;  Surgeon: Charlott Rakes, MD;  Location: [facility] ENDOSCOPY;  Service: Endoscopy;  Laterality: N/A;  . HARDWARE REMOVAL Left 01/29/2018   Procedure: HARDWARE REMOVAL LEFT ANKLE;  Surgeon: Nadara Mustard, MD;  Location[facility] OR;  Service: Orthopedics;  Laterality: Left;  . TOTAL KNEE ARTHROPLASTY Right 07/21/2016   Procedure: TOTAL KNEE ARTHROPLASTY;  Surgeon: Cammy Copa,  MD;  Location: [facility] OR;  Service: Orthopedics;  Laterality: Right;  . UPPER GASTROINTESTINAL ENDOSCOPY     Social History   Occupational History  . Not on file  Tobacco Use  . Smoking status: Heavy Tobacco Smoker    Packs/day: 1.50    Years: 38.00    Pack years: 57.00    Types: Cigarettes  . Smokeless tobacco: Never Used  . Tobacco comment: onset age 1; upto 1.5 ppd  Substance and Sexual Activity  . Alcohol use: Not Currently    Alcohol/week: 3.0 standard drinks    Types: 3 Cans of beer per week    Comment: quit 2015; formerly up to 2 fifths/day  . Drug use: No  . Sexual activity: Yes    Comment: declined condoms

## 2018-06-09 NOTE — Addendum Note (Signed)
Addended by: Daylene Posey T on: 06/09/2018 01:52 PM   Modules accepted: Orders

## 2018-06-14 ENCOUNTER — Other Ambulatory Visit (INDEPENDENT_AMBULATORY_CARE_PROVIDER_SITE_OTHER): Payer: Self-pay | Admitting: Orthopedic Surgery

## 2018-06-14 ENCOUNTER — Telehealth (INDEPENDENT_AMBULATORY_CARE_PROVIDER_SITE_OTHER): Payer: Self-pay | Admitting: Orthopedic Surgery

## 2018-06-14 MED ORDER — OXYCODONE-ACETAMINOPHEN 5-325 MG PO TABS: 1.0000 | ORAL_TABLET | Freq: Three times a day (TID) | ORAL | 0 refills | Status: DC | PRN

## 2018-06-14 NOTE — Telephone Encounter (Signed)
Pt is sch for an MRI to r/o recurrent infection osteo s/p HDW removal ankle fusion. His appt is 06/20/18 and wants a refill of his Percocet 5/325. Last refill was 06/03/18 #7

## 2018-06-14 NOTE — Telephone Encounter (Signed)
I called pt and advised rx has been faxed. Also pt has follow up appt on 06/27/18 to review results reminded him of this will call with any questions.

## 2018-06-14 NOTE — Telephone Encounter (Signed)
Mr. Wieber is calling for a medication refill on his oxycodone.

## 2018-06-14 NOTE — Telephone Encounter (Signed)
rx sent

## 2018-06-20 ENCOUNTER — Other Ambulatory Visit: Payer: Self-pay

## 2018-06-20 ENCOUNTER — Ambulatory Visit
Admission: RE | Admit: 2018-06-20 | Discharge: 2018-06-20 | Disposition: A | Discharge status: Home / Self Care | Payer: Medicare Other | Source: Ambulatory Visit | Attending: Physician Assistant | Admitting: Physician Assistant

## 2018-06-20 DIAGNOSIS — T847XXD Infection and inflammatory reaction due to other internal orthopedic prosthetic devices, implants and grafts, subsequent encounter: Secondary | ICD-10-CM

## 2018-06-20 MED ORDER — GADOBENATE DIMEGLUMINE 529 MG/ML IV SOLN
19.0000 mL | Freq: Once | INTRAVENOUS | Status: AC | PRN
  Administered 2018-06-20: 19 mL via INTRAVENOUS

## 2018-06-21 ENCOUNTER — Telehealth (INDEPENDENT_AMBULATORY_CARE_PROVIDER_SITE_OTHER): Payer: Self-pay | Admitting: Orthopedic Surgery

## 2018-06-21 NOTE — Telephone Encounter (Signed)
Patient's MRI scan is reviewed which shows no evidence of increased or acute infection.  There is still persistent areas of nonunion.  Would have the patient follow-up to place him in a walking cast.

## 2018-06-22 ENCOUNTER — Encounter (HOSPITAL_COMMUNITY): Payer: Self-pay | Admitting: Emergency Medicine

## 2018-06-22 ENCOUNTER — Emergency Department (HOSPITAL_COMMUNITY)
Admission: EM | Admit: 2018-06-22 | Discharge: 2018-06-22 | Disposition: A | Discharge status: Home / Self Care | Payer: Medicare Other | Attending: Emergency Medicine | Admitting: Emergency Medicine

## 2018-06-22 DIAGNOSIS — I10 Essential (primary) hypertension: Secondary | ICD-10-CM | POA: Insufficient documentation

## 2018-06-22 DIAGNOSIS — Z79899 Other long term (current) drug therapy: Secondary | ICD-10-CM | POA: Insufficient documentation

## 2018-06-22 DIAGNOSIS — Z8673 Personal history of transient ischemic attack (TIA), and cerebral infarction without residual deficits: Secondary | ICD-10-CM | POA: Diagnosis not present

## 2018-06-22 DIAGNOSIS — M545 Low back pain: Secondary | ICD-10-CM | POA: Diagnosis present

## 2018-06-22 DIAGNOSIS — J45909 Unspecified asthma, uncomplicated: Secondary | ICD-10-CM | POA: Diagnosis not present

## 2018-06-22 DIAGNOSIS — E119 Type 2 diabetes mellitus without complications: Secondary | ICD-10-CM | POA: Insufficient documentation

## 2018-06-22 DIAGNOSIS — F17210 Nicotine dependence, cigarettes, uncomplicated: Secondary | ICD-10-CM | POA: Insufficient documentation

## 2018-06-22 DIAGNOSIS — Z96651 Presence of right artificial knee joint: Secondary | ICD-10-CM | POA: Insufficient documentation

## 2018-06-22 DIAGNOSIS — M5417 Radiculopathy, lumbosacral region: Secondary | ICD-10-CM | POA: Diagnosis not present

## 2018-06-22 LAB — URINALYSIS, ROUTINE W REFLEX MICROSCOPIC
Bilirubin Urine: NEGATIVE
Glucose, UA: NEGATIVE mg/dL
Hgb urine dipstick: NEGATIVE
Ketones, ur: NEGATIVE mg/dL
Leukocytes,Ua: NEGATIVE
Nitrite: NEGATIVE
Protein, ur: NEGATIVE mg/dL
Specific Gravity, Urine: 1.004 — ABNORMAL LOW (ref 1.005–1.030)
pH: 7 (ref 5.0–8.0)

## 2018-06-22 LAB — RAPID URINE DRUG SCREEN, HOSP PERFORMED
Amphetamines: NOT DETECTED
Barbiturates: NOT DETECTED
Benzodiazepines: NOT DETECTED
Cocaine: POSITIVE — AB
Opiates: NOT DETECTED
Tetrahydrocannabinol: NOT DETECTED

## 2018-06-22 MED ORDER — FENTANYL CITRATE (PF) 100 MCG/2ML IJ SOLN
100.0000 ug | Freq: Once | INTRAMUSCULAR | Status: AC
  Administered 2018-06-22: 100 ug via INTRAVENOUS
  Filled 2018-06-22: qty 2

## 2018-06-22 MED ORDER — HYDROCODONE-ACETAMINOPHEN 5-325 MG PO TABS
2.0000 | ORAL_TABLET | Freq: Once | ORAL | Status: AC
  Administered 2018-06-22: 2 via ORAL
  Filled 2018-06-22: qty 2

## 2018-06-22 MED ORDER — SODIUM CHLORIDE 0.9 % IV BOLUS (SEPSIS)
1000.0000 mL | Freq: Once | INTRAVENOUS | Status: AC
  Administered 2018-06-22: 1000 mL via INTRAVENOUS

## 2018-06-22 NOTE — ED Provider Notes (Signed)
[facility] DEPT Provider Note   CSN: [id number] Arrival date & time: 06/22/18  0102    History   Chief Complaint Chief Complaint  Patient presents with  . Back Pain    HPI [name] is a 53 y.o. male.     The history is provided by the patient.  Back Pain  Location:  Lumbar spine Quality:  Aching Pain severity:  Severe Onset quality:  Gradual Timing:  Constant Progression:  Worsening Chronicity:  New Relieved by:  Nothing Worsened by:  Movement Associated symptoms: no abdominal pain, no dysuria, no fever and no weakness   Patient with history of anxiety, hypertension, history of alcohol abuse, history of cocaine use, history of hardware complication of the left ankle, presents with back pain. Patient has had worsening back pain over the past day.  No trauma.  Patient is ambulatory. No new weakness in his legs. No incontinence is reported.  Denies previous history of back surgery  Past Medical History:  Diagnosis Date  . Anxiety   . Arthritis   . Asthma   . Duodenal adenoma   . GERD (gastroesophageal reflux disease)   . Hypertension   . Pneumonia    hx  . Post-traumatic osteoarthritis of left ankle   . Pre-diabetes   . Schizophrenia ([place])   . Stroke [facility])    2009-or10    Patient Active Problem List   Diagnosis Date Noted  . Medication monitoring encounter 02/09/2018  . Hardware complicating wound infection ([place]) 01/31/2018  . Spondylolisthesis, lumbar region 12/30/2017  . S/P ankle fusion 10/20/2017  . Screening for colon cancer 06/29/2017  . Seasonal allergies 06/29/2017  . Post-traumatic osteoarthritis, left ankle and foot 01/07/2017  . Presence of right artificial knee joint 12/17/2016  . Primary osteoarthritis of right knee 05/27/2016  . GERD (gastroesophageal reflux disease) 07/09/2015  . Mild persistent asthma 07/09/2015  . Type 2 diabetes mellitus ([place]) 07/09/2015  . Polyp of duodenum 06/04/2015  . Upper GI  bleed 02/18/2015  . Benign essential HTN 02/18/2015  . Anemia of chronic disease 02/18/2015  . Pulmonary nodule 02/18/2015  . Cocaine abuse ([place]) 03/02/2014  . Posttraumatic stress disorder 03/02/2014  . History of CVA (cerebrovascular accident) 01/12/2014  . Schizophrenia ([place]) 01/12/2014  . Tobacco abuse 12/06/2013  . Tobacco dependence syndrome 12/06/2013    Past Surgical History:  Procedure Laterality Date  . ANKLE ARTHROSCOPY Left 10/20/2017   Procedure: ANKLE ARTHROSCOPY;  Surgeon: Newt Minion, MD;  Location: [facility];  Service: Orthopedics;  Laterality: Left;  . ANKLE FUSION Left 10/20/2017  . ANKLE FUSION Left 10/20/2017   Procedure: LEFT ANKLE FUSION;  Surgeon: Newt Minion, MD;  Location: [place];  Service: Orthopedics;  Laterality: Left;  . ESOPHAGOGASTRODUODENOSCOPY Left 12/05/2013   Procedure: ESOPHAGOGASTRODUODENOSCOPY (EGD);  Surgeon: Arta Silence, MD;  Location: [facility];  Service: Endoscopy;  Laterality: Left;  . ESOPHAGOGASTRODUODENOSCOPY (EGD) WITH PROPOFOL N/A 02/20/2015   Procedure: ESOPHAGOGASTRODUODENOSCOPY (EGD) WITH PROPOFOL;  Surgeon: Wilford Corner, MD;  Location: [facility];  Service: Endoscopy;  Laterality: N/A;  . HARDWARE REMOVAL Left 01/29/2018   Procedure: HARDWARE REMOVAL LEFT ANKLE;  Surgeon: Newt Minion, MD;  Location: [facility] [place];  Service: Orthopedics;  Laterality: Left;  . TOTAL KNEE ARTHROPLASTY Right 07/21/2016   Procedure: TOTAL KNEE ARTHROPLASTY;  Surgeon: Meredith Pel, MD;  Location: [facility];  Service: Orthopedics;  Laterality: Right;  . UPPER GASTROINTESTINAL ENDOSCOPY          Home Medications  Prior to Admission medications   Medication Sig Start Date End Date Taking? Authorizing Provider  albuterol (PROVENTIL HFA;VENTOLIN HFA) 108 (90 Base) MCG/ACT inhaler Inhale 1 puff into the lungs 2 (two) times daily as needed for wheezing or shortness of breath. 05/14/17   Diallo, Earna Coder, MD  amLODipine (NORVASC) 2.5 MG tablet Take 1  tablet (2.5 mg total) by mouth daily. 02/01/18   Bonnell Public, MD  benztropine (COGENTIN) 1 MG tablet Take 1 mg by mouth daily.    [provider]  bisacodyl (DULCOLAX) 10 MG suppository Place 1 suppository (10 mg total) rectally daily as needed for moderate constipation. 01/31/18   Dana Allan I, MD  buPROPion (WELLBUTRIN XL) 150 MG 24 hr tablet TAKE 1 TABLET(150 MG) BY MOUTH DAILY Patient taking differently: Take 150 mg by mouth daily.  05/14/17   Diallo, Earna Coder, MD  cetirizine (ZYRTEC) 10 MG tablet TAKE 1 TABLET BY MOUTH ONCE DAILY. 03/28/18   Diallo, Earna Coder, MD  docusate sodium (COLACE) 100 MG capsule Take 1 capsule (100 mg total) by mouth 2 (two) times daily. 01/31/18   Bonnell Public, MD  HYDROcodone-acetaminophen (NORCO/VICODIN) 5-325 MG tablet Take 1 tablet by mouth every 6 (six) hours as needed for moderate pain (prn severe pain). 03/14/18   Newt Minion, MD  INVEGA TRINZA 819 MG/2.625ML SUSY Inject 819 mg into the skin every 3 (three) months. 09/08/17   [provider]  losartan (COZAAR) 50 MG tablet Take 1 tablet (50 mg total) by mouth daily. 05/23/18   Diallo, Earna Coder, MD  mirtazapine (REMERON) 15 MG tablet Take 15 mg by mouth at bedtime.    [provider]  Multiple Vitamins-Minerals (VITRUM SENIOR) TABS Take 1 tablet by mouth daily.  04/30/15   [provider]  omeprazole (PRILOSEC) 40 MG capsule Take 1 capsule (40 mg total) by mouth daily. 10/06/17   Irene Shipper, MD  oxyCODONE (OXY IR/ROXICODONE) 5 MG immediate release tablet Take 1-2 tablets (5-10 mg total) by mouth every 4 (four) hours as needed for moderate pain or severe pain (pain score 4-6). 02/10/18   Rayburn, Neta Mends, PA-C  oxyCODONE-acetaminophen (PERCOCET/ROXICET) 5-325 MG tablet Take 1 tablet by mouth every 8 (eight) hours as needed for moderate pain or severe pain. 06/14/18   Newt Minion, MD  polyethylene glycol [facility] Floria Raveling) packet Take 17 g by  mouth daily as needed for mild constipation. 01/31/18   Dana Allan I, MD  sucralfate (CARAFATE) 1 g tablet TAKE 1 TABLET FOUR TIMES DAILY FOR ACID REFLUX. Patient taking differently: Take 1 g by mouth 4 (four) times daily.  06/21/17   Diallo, Earna Coder, MD  sucralfate (CARAFATE) 1 g tablet TAKE 1 TABLET FOUR TIMES DAILY FOR ACID REFLUX. 01/31/18   Diallo, Earna Coder, MD  sucralfate (CARAFATE) 1 g tablet TAKE 1 TABLET FOUR TIMES DAILY FOR ACID REFLUX. 02/28/18   Diallo, Earna Coder, MD  sucralfate (CARAFATE) 1 g tablet TAKE 1 TABLET FOUR TIMES DAILY FOR ACID REFLUX. 03/28/18   Diallo, Earna Coder, MD  sulfamethoxazole-trimethoprim (BACTRIM DS,SEPTRA DS) 800-160 MG tablet Take 1 tablet by mouth 2 (two) times daily. 06/03/18   Rayburn, Neta Mends, PA-C    Family History Family History  Adopted: Yes  Problem Relation Age of Onset  . Colon cancer Neg Hx   . Esophageal cancer Neg Hx   . Rectal cancer Neg Hx   . Stomach cancer Neg Hx     Social History Social History   Tobacco Use  . Smoking  status: Heavy Tobacco Smoker    Packs/day: 1.50    Years: 38.00    Pack years: 57.00    Types: Cigarettes  . Smokeless tobacco: Never Used  . Tobacco comment: onset age 44; upto 1.5 ppd  Substance Use Topics  . Alcohol use: Not Currently    Alcohol/week: 3.0 standard drinks    Types: 3 Cans of beer per week    Comment: quit 2015; formerly up to 2 fifths/day  . Drug use: No     Allergies   Patient has no known allergies.   Review of Systems Review of Systems  Constitutional: Negative for fever.  Respiratory: Negative for cough.   Gastrointestinal: Negative for abdominal pain.  Genitourinary: Negative for difficulty urinating and dysuria.  Musculoskeletal: Positive for back pain.  Neurological: Negative for weakness.  All other systems reviewed and are negative.    Physical Exam Updated Vital Signs BP (!) 147/102 (BP Location: Left Arm) Comment: Keeps moving legs  Pulse (!) 116    Temp 98.5 F (36.9 C) (Oral)   Resp 19   Ht 1.753 m (5\' 9" )   Wt 90.7 kg   SpO2 100%   BMI 29.53 kg/m   Physical Exam CONSTITUTIONAL: disheveled, anxious HEAD: Normocephalic/atraumatic EYES: EOMI ENMT: Mucous membranes moist NECK: supple no meningeal signs SPINE/BACK:entire spine nontender , No bruising/crepitance/stepoffs noted to spine CV: S1/S2 noted, no murmurs/rubs/gallops noted LUNGS: Lungs are clear to auscultation bilaterally, no apparent distress ABDOMEN: soft, nontender, no rebound or guarding GU:no cva tenderness NEURO: Awake/alert, no facial droop equal motor 5/5 strength noted with the following: hip flexion/knee flexion/extension, ankle flexion/extension limited due to prior surgeries EXTREMITIES: pulses normal, full ROM, chronic left ankle deformity noted, no erythema, unchanged from prior photographs SKIN: warm, color normal PSYCH: mildly anxious  ED Treatments / Results  Labs (all labs ordered are listed, but only abnormal results are displayed) Labs Reviewed  RAPID URINE DRUG SCREEN, HOSP PERFORMED - Abnormal; Notable for the following components:      Result Value   Cocaine POSITIVE (*)    All other components within normal limits  URINALYSIS, ROUTINE W REFLEX MICROSCOPIC - Abnormal; Notable for the following components:   Color, Urine STRAW (*)    Specific Gravity, Urine 1.004 (*)    All other components within normal limits    EKG None  Radiology   Procedures Procedures   Medications Ordered in ED Medications  HYDROcodone-acetaminophen (NORCO/VICODIN) 5-325 MG per tablet 2 tablet (2 tablets Oral Given 06/22/18 0214)  sodium chloride 0.9 % bolus 1,000 mL (1,000 mLs Intravenous New Bag/Given 06/22/18 0243)  fentaNYL (SUBLIMAZE) injection 100 mcg (100 mcg Intravenous Given 06/22/18 0252)     Initial Impression / Assessment and Plan / ED Course  I have reviewed the triage vital signs and the nursing notes.         2:17 AM Pt has had  lumbar imaging in past 6 months that was negative No new signs of infection in left ankle - appearance unchanged from prior photos 3:24 AM Patient appears improved. He can walk. He can move both lower extremities.  However he does have continued pain from his buttock into his left leg.  Strong suspicion for radiculopathy. Denies any incontinence. I feel is appropriate for discharge home. I reviewed narcotic database and he is received 2 separate narcotic prescriptions this month He has been tachycardic and hypertensive though this is improving.  His drug screen was positive for cocaine which likely contributed to these findings  Final Clinical Impressions(s) / ED Diagnoses   Final diagnoses:  Lumbosacral radiculopathy    ED Discharge Orders    None       Ripley Fraise, MD 06/22/18 [phone]

## 2018-06-22 NOTE — Congregational Nurse Program (Signed)
Pt complained of lower back pain that radiates down legs. Scale 10 of 10. Observed uncontrolled muscle movement and rapid breathing. Pt refused referral.

## 2018-06-22 NOTE — ED Notes (Signed)
Bed: TJ03 Expected date:  Expected time:  Means of arrival:  Comments: 52 lower back, ambulatory.

## 2018-06-22 NOTE — ED Triage Notes (Signed)
Pt comes to ed via ems, c/o back pain chronic.  V/s 172/110, cbg 119, pluses 124, rr16.   Alert x 4. Walk. Comes from home.

## 2018-06-22 NOTE — Discharge Instructions (Addendum)

## 2018-06-23 ENCOUNTER — Other Ambulatory Visit: Payer: Self-pay | Admitting: Family Medicine

## 2018-06-24 ENCOUNTER — Telehealth (INDEPENDENT_AMBULATORY_CARE_PROVIDER_SITE_OTHER): Payer: Self-pay

## 2018-06-24 NOTE — Telephone Encounter (Signed)
Pt was called and was screened for COVID-19 and answered No to all questions.

## 2018-06-27 ENCOUNTER — Ambulatory Visit (INDEPENDENT_AMBULATORY_CARE_PROVIDER_SITE_OTHER): Payer: Medicare Other | Admitting: Orthopedic Surgery

## 2018-06-27 ENCOUNTER — Telehealth (INDEPENDENT_AMBULATORY_CARE_PROVIDER_SITE_OTHER): Payer: Self-pay

## 2018-06-27 NOTE — Telephone Encounter (Signed)
Called pt to see if we can r/s his appt from today ( he was a NO SHOW) advised that we can see the pt Wednesday afternoon. He asked if he could call back to sch. Advised this would be fine.

## 2018-06-30 ENCOUNTER — Telehealth (INDEPENDENT_AMBULATORY_CARE_PROVIDER_SITE_OTHER): Payer: Self-pay | Admitting: Radiology

## 2018-06-30 NOTE — Telephone Encounter (Signed)
Called and spoke with patient, patient answered NO to all pre screening questions for appointment on 4/6 

## 2018-07-04 ENCOUNTER — Encounter (INDEPENDENT_AMBULATORY_CARE_PROVIDER_SITE_OTHER): Payer: Self-pay | Admitting: Orthopedic Surgery

## 2018-07-04 ENCOUNTER — Other Ambulatory Visit: Payer: Self-pay

## 2018-07-04 ENCOUNTER — Ambulatory Visit (INDEPENDENT_AMBULATORY_CARE_PROVIDER_SITE_OTHER): Payer: Medicaid Other | Admitting: Orthopedic Surgery

## 2018-07-04 VITALS — Ht 69.0 in | Wt 200.0 lb

## 2018-07-04 DIAGNOSIS — M545 Low back pain, unspecified: Secondary | ICD-10-CM

## 2018-07-04 DIAGNOSIS — M25572 Pain in left ankle and joints of left foot: Secondary | ICD-10-CM | POA: Diagnosis not present

## 2018-07-04 DIAGNOSIS — T847XXD Infection and inflammatory reaction due to other internal orthopedic prosthetic devices, implants and grafts, subsequent encounter: Secondary | ICD-10-CM

## 2018-07-04 DIAGNOSIS — M79605 Pain in left leg: Secondary | ICD-10-CM

## 2018-07-04 MED ORDER — OXYCODONE-ACETAMINOPHEN 5-325 MG PO TABS: 1.0000 | ORAL_TABLET | Freq: Three times a day (TID) | ORAL | 0 refills | Status: DC | PRN

## 2018-07-04 NOTE — Progress Notes (Signed)
Office Visit Note   Patient: [name]           Date of Birth: Dec 14, 1965           MRN: [id number] Visit Date: 07/04/2018              Requested by: Lovena Neighbours, MD [address] [place], [place] [address] PCP: Lovena Neighbours, MD  Chief Complaint  Patient presents with  . Left Ankle - Follow-up    MRI Review      HPI: Patient is a 53 year old gentleman who is seen in follow-up status post removal of hardware left ankle for infection of the deep retained hardware.  Patient has followed up with infectious disease but he did not obtain his last blood work.  Patient complains of lower back pain with left-sided radicular pain and states he still has ankle pain.  He is status post this MRI scan.  Assessment & Plan: Visit Diagnoses:  1. Pain in left ankle and joints of left foot   2. Hardware complicating wound infection, subsequent encounter   3. Low back pain radiating to left leg     Plan: Patient will complete his Bactrim DS.  We will obtain a CBC sed rate and C-reactive protein.  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  Clinically he has no signs or symptoms of infection.  We will repeat check his blood work will place him in a short leg cast discussed the importance of nonweightbearing he is given a prescription for a kneeling scooter.  Also discussed the importance of complete smoking cessation.  Discussed the risks of smoking with recurrent infection nonunion and his sciatic symptoms.  Patient states he would like to follow-up with Dr. Otelia Sergeant for his back.  Prescription written for Percocet for pain.  Follow-Up Instructions: Return in about 3 weeks (around 07/25/2018).   Ortho Exam  Patient is alert, oriented, no adenopathy, well-dressed, normal affect, normal respiratory effort. Examination patient has a positive straight leg raise on the left but no focal motor weakness.  Examination of the left foot there is good wrinkling of  the skin there is no swelling no cellulitis no open wounds no drainage clinically patient has no signs of any infection.  His MRI scan was reviewed which shows edema in the tibia and talus with the nonunion however there is no signs of abscess.  Imaging: No results found. No images are attached to the encounter.  Labs: Lab Results  Component Value Date   HGBA1C 5.6 11/05/2016   HGBA1C 6.0 (H) 07/10/2016   HGBA1C 5.8 04/10/2016   ESRSEDRATE 9 03/08/2018   ESRSEDRATE 28 (H) 02/09/2018   ESRSEDRATE 94 (H) 01/28/2018   CRP 56.5 (H) 03/08/2018   CRP 9.0 (H) 02/09/2018   CRP 12.6 (H) 01/28/2018   LABURIC 5.2 04/17/2014   REPTSTATUS 02/03/2018 FINAL 01/29/2018   GRAMSTAIN  01/29/2018    MODERATE WBC PRESENT,BOTH PMN AND MONONUCLEAR NO ORGANISMS SEEN    CULT  01/29/2018    RARE ENTEROBACTER CLOACAE CRITICAL RESULT CALLED TO, READ BACK BY AND VERIFIED WITH: Durwin Nora RN, AT 1156 01/31/18 BY D. Leighton Roach REGARDING CULTURE GROWTH NO ANAEROBES ISOLATED Performed at [facility], [address] [place], [place] [address]    LABORGA ENTEROBACTER CLOACAE 01/29/2018     Lab Results  Component Value Date   ALBUMIN 3.4 (L) 01/28/2018   ALBUMIN 4.3 06/29/2017   ALBUMIN 4.1 04/28/2017   LABURIC 5.2  04/17/2014    Body mass index is 29.53 kg/m.  Orders:  Orders Placed This Encounter  Procedures  . CBC with Differential  . Sed Rate (ESR)  . C-reactive protein   Meds ordered this encounter  Medications  . oxyCODONE-acetaminophen (PERCOCET/ROXICET) 5-325 MG tablet    Sig: Take 1 tablet by mouth every 8 (eight) hours as needed.    Dispense:  20 tablet    Refill:  0     Procedures: No procedures performed  Clinical Data: No additional findings.  ROS:  All other systems negative, except as noted in the HPI. Review of Systems  Objective: Vital Signs: Ht 5\' 9"  (1.753 m)   Wt 200 lb (90.7 kg)   BMI 29.53 kg/m   Specialty Comments:  No specialty comments available.   PMFS History: Patient Active Problem List   Diagnosis Date Noted  . Medication monitoring encounter 02/09/2018  . Hardware complicating wound infection ([facility]) 01/31/2018  . Spondylolisthesis, lumbar region 12/30/2017  . S/P ankle fusion 10/20/2017  . Screening for colon cancer 06/29/2017  . Seasonal allergies 06/29/2017  . Post-traumatic osteoarthritis, left ankle and foot 01/07/2017  . Presence of right artificial knee joint 12/17/2016  . Primary osteoarthritis of right knee 05/27/2016  . GERD (gastroesophageal reflux disease) 07/09/2015  . Mild persistent asthma 07/09/2015  . Type 2 diabetes mellitus ([facility]) 07/09/2015  . Polyp of duodenum 06/04/2015  . Upper GI bleed 02/18/2015  . Benign essential HTN 02/18/2015  . Anemia of chronic disease 02/18/2015  . Pulmonary nodule 02/18/2015  . Cocaine abuse ([facility]) 03/02/2014  . Posttraumatic stress disorder 03/02/2014  . History of CVA (cerebrovascular accident) 01/12/2014  . Schizophrenia ([facility]) 01/12/2014  . Tobacco abuse 12/06/2013  . Tobacco dependence syndrome 12/06/2013   Past Medical History:  Diagnosis Date  . Anxiety   . Arthritis   . Asthma   . Duodenal adenoma   . GERD (gastroesophageal reflux disease)   . Hypertension   . Pneumonia    hx  . Post-traumatic osteoarthritis of left ankle   . Pre-diabetes   . Schizophrenia ([facility])   . Stroke [facility])    2009-or10    Family History  Adopted: Yes  Problem Relation Age of Onset  . Colon cancer Neg Hx   . Esophageal cancer Neg Hx   . Rectal cancer Neg Hx   . Stomach cancer Neg Hx     Past Surgical History:  Procedure Laterality Date  . ANKLE ARTHROSCOPY Left 10/20/2017   Procedure: ANKLE ARTHROSCOPY;  Surgeon: Nadara Mustard, MD;  Location: [facility] OR;  Service: Orthopedics;  Laterality: Left;  . ANKLE FUSION Left 10/20/2017  . ANKLE FUSION Left 10/20/2017   Procedure: LEFT ANKLE FUSION;  Surgeon: Nadara Mustard, MD;  Location: [facility] OR;  Service: Orthopedics;  Laterality: Left;   . ESOPHAGOGASTRODUODENOSCOPY Left 12/05/2013   Procedure: ESOPHAGOGASTRODUODENOSCOPY (EGD);  Surgeon: Willis Modena, MD;  Location: [facility];  Service: Endoscopy;  Laterality: Left;  . ESOPHAGOGASTRODUODENOSCOPY (EGD) WITH PROPOFOL N/A 02/20/2015   Procedure: ESOPHAGOGASTRODUODENOSCOPY (EGD) WITH PROPOFOL;  Surgeon: Charlott Rakes, MD;  Location: [facility];  Service: Endoscopy;  Laterality: N/A;  . HARDWARE REMOVAL Left 01/29/2018   Procedure: HARDWARE REMOVAL LEFT ANKLE;  Surgeon: Nadara Mustard, MD;  Location: [facility] OR;  Service: Orthopedics;  Laterality: Left;  . TOTAL KNEE ARTHROPLASTY Right 07/21/2016   Procedure: TOTAL KNEE ARTHROPLASTY;  Surgeon: Cammy Copa, MD;  Location: [facility] OR;  Service: Orthopedics;  Laterality: Right;  . UPPER GASTROINTESTINAL ENDOSCOPY  Social History   Occupational History  . Not on file  Tobacco Use  . Smoking status: Heavy Tobacco Smoker    Packs/day: 1.50    Years: 38.00    Pack years: 57.00    Types: Cigarettes  . Smokeless tobacco: Never Used  . Tobacco comment: onset age 81; upto 1.5 ppd  Substance and Sexual Activity  . Alcohol use: Not Currently    Alcohol/week: 3.0 standard drinks    Types: 3 Cans of beer per week    Comment: quit 2015; formerly up to 2 fifths/day  . Drug use: No  . Sexual activity: Yes    Comment: declined condoms

## 2018-07-05 ENCOUNTER — Other Ambulatory Visit (INDEPENDENT_AMBULATORY_CARE_PROVIDER_SITE_OTHER): Payer: Self-pay

## 2018-07-05 LAB — CBC WITH DIFFERENTIAL/PLATELET
Absolute Monocytes: 540 cells/uL (ref 200–950)
Basophils Absolute: 36 cells/uL (ref 0–200)
Basophils Relative: 0.4 %
Eosinophils Absolute: 216 cells/uL (ref 15–500)
Eosinophils Relative: 2.4 %
HCT: 37.2 % — ABNORMAL LOW (ref 38.5–50.0)
Hemoglobin: 12.4 g/dL — ABNORMAL LOW (ref 13.2–17.1)
Lymphs Abs: 1557 cells/uL (ref 850–3900)
MCH: 27.3 pg (ref 27.0–33.0)
MCHC: 33.3 g/dL (ref 32.0–36.0)
MCV: 81.8 fL (ref 80.0–100.0)
MPV: 8.9 fL (ref 7.5–12.5)
Monocytes Relative: 6 %
Neutro Abs: 6651 cells/uL (ref 1500–7800)
Neutrophils Relative %: 73.9 %
Platelets: 509 10*3/uL — ABNORMAL HIGH (ref 140–400)
RBC: 4.55 10*6/uL (ref 4.20–5.80)
RDW: 13.7 % (ref 11.0–15.0)
Total Lymphocyte: 17.3 %
WBC: 9 10*3/uL (ref 3.8–10.8)

## 2018-07-05 LAB — C-REACTIVE PROTEIN: CRP: 17.1 mg/L — ABNORMAL HIGH (ref <8.0)

## 2018-07-05 LAB — SEDIMENTATION RATE: Sed Rate: 29 mm/h — ABNORMAL HIGH (ref 0–20)

## 2018-07-05 MED ORDER — CIPROFLOXACIN HCL 500 MG PO TABS: 500.0000 mg | ORAL_TABLET | Freq: Two times a day (BID) | ORAL | 0 refills | Status: DC

## 2018-07-07 ENCOUNTER — Telehealth (INDEPENDENT_AMBULATORY_CARE_PROVIDER_SITE_OTHER): Payer: Self-pay | Admitting: Radiology

## 2018-07-07 NOTE — Telephone Encounter (Signed)
Called and spoke with patient, patient answered NO to all pre screening questions for appointment on 4/13 

## 2018-07-11 ENCOUNTER — Encounter (INDEPENDENT_AMBULATORY_CARE_PROVIDER_SITE_OTHER): Payer: Self-pay | Admitting: Specialist

## 2018-07-11 ENCOUNTER — Ambulatory Visit (INDEPENDENT_AMBULATORY_CARE_PROVIDER_SITE_OTHER): Payer: Medicare Other | Admitting: Specialist

## 2018-07-11 ENCOUNTER — Ambulatory Visit (INDEPENDENT_AMBULATORY_CARE_PROVIDER_SITE_OTHER): Payer: Self-pay

## 2018-07-11 ENCOUNTER — Other Ambulatory Visit: Payer: Self-pay

## 2018-07-11 VITALS — BP 137/91 | HR 111 | Ht 69.0 in | Wt 230.0 lb

## 2018-07-11 DIAGNOSIS — M545 Low back pain, unspecified: Secondary | ICD-10-CM

## 2018-07-11 DIAGNOSIS — M4316 Spondylolisthesis, lumbar region: Secondary | ICD-10-CM

## 2018-07-11 DIAGNOSIS — M4807 Spinal stenosis, lumbosacral region: Secondary | ICD-10-CM

## 2018-07-11 DIAGNOSIS — M48062 Spinal stenosis, lumbar region with neurogenic claudication: Secondary | ICD-10-CM

## 2018-07-11 MED ORDER — OXYCODONE-ACETAMINOPHEN 7.5-325 MG PO TABS: 1.0000 | ORAL_TABLET | ORAL | 0 refills | Status: DC | PRN

## 2018-07-11 MED ORDER — GABAPENTIN 300 MG PO CAPS: ORAL_CAPSULE | ORAL | 0 refills | Status: DC

## 2018-07-11 NOTE — Progress Notes (Signed)
Office Visit Note   Patient: [name]           Date of Birth: 07-Jan-1966           MRN: [id number] Visit Date: 07/11/2018              Requested by: Lovena Neighbours, MD [address] [place], [place] [address] PCP: Lovena Neighbours, MD   Assessment & Plan: Visit Diagnoses:  1. Low back pain, unspecified back pain laterality, unspecified chronicity, unspecified whether sciatica present   2. Spondylolisthesis, lumbar region   3. Spinal stenosis of lumbar region with neurogenic claudication   4. Spinal stenosis of lumbosacral region   5. Spondylolisthesis of lumbar region     Plan: Avoid bending, stooping and avoid lifting weights greater than 10 lbs. Avoid prolong standing and walking. Avoid frequent bending and stooping  No lifting greater than 10 lbs. May use ice or moist heat for pain. Weight loss is of benefit. Handicap license is approved. MRI of the lumbar spine is ordered. Percocet 7.5 mg one every 4 hours prn pain.  Gabapentin 300 mg ramping up every 3 days to TID.  Follow-Up Instructions: Return in about 2 weeks (around 07/25/2018).   Orders:  Orders Placed This Encounter  Procedures  . XR Lumbar Spine 2-3 Views  . MR Lumbar Spine w/o contrast   Meds ordered this encounter  Medications  . oxyCODONE-acetaminophen (PERCOCET) 7.5-325 MG tablet    Sig: Take 1 tablet by mouth every 4 (four) hours as needed for severe pain.    Dispense:  40 tablet    Refill:  0  . gabapentin (NEURONTIN) 300 MG capsule    Sig: Take 1 capsule (300 mg total) by mouth at bedtime for 3 days, THEN 1 capsule (300 mg total) 2 (two) times daily for 3 days, THEN 1 capsule (300 mg total) 3 (three) times daily for 24 days.    Dispense:  81 capsule    Refill:  0      Procedures: No procedures performed   Clinical Data: No additional findings.   Subjective: Chief Complaint  Patient presents with  . Lower Back - Pain    53 year old male with history of back pain since MRI  of the left ankle 3/23 down the back of both thighs and legs left greater than the right. The pain is such that he is having difficulty walking a distance even walking back to the back of the office. He has pain with sitting on the commode, sitting the legs start hurting. No bending or stooping, difficulty riding in the car. He is not usually a complainer, having pain at night in any position and all day. There is pain in the hips. Hurts to walk, to stand and at night. Taking ibuprofen and is taking 5 mg percocet for his ankle and is alternating ice and heat. No bowel or bladder difficulty except pain with straining. The pain such that he went into the hospital at 2AM last week or two ago and he was discharged home. The ER performed injection when meds by mouth didn't help. He has a history of back pain but not like this. He has had xray a couple of years ago. He is now having pain with walking, sitting, lying down.  Any position I get into it is worse. He had history of ankle fracture ORIF    Review of Systems  Constitutional: Positive for activity change, chills, fever and unexpected weight change.  Negative for appetite change, diaphoresis and fatigue.  HENT: Positive for dental problem, hearing loss and tinnitus. Negative for congestion, drooling, ear discharge, ear pain, facial swelling, mouth sores, nosebleeds, postnasal drip, rhinorrhea, sinus pressure, sinus pain, sneezing, sore throat, trouble swallowing and voice change.   Eyes: Positive for visual disturbance. Negative for photophobia, pain, discharge, redness and itching.  Respiratory: Positive for shortness of breath and wheezing. Negative for apnea, cough, choking, chest tightness and stridor.   Cardiovascular: Negative.  Negative for chest pain, palpitations and leg swelling.  Gastrointestinal: Negative.  Negative for abdominal distention, abdominal pain, anal bleeding, blood in stool, constipation, diarrhea, nausea, rectal pain and  vomiting.  Endocrine: Positive for cold intolerance. Negative for heat intolerance, polydipsia, polyphagia and polyuria.  Genitourinary: Negative.  Negative for difficulty urinating, dysuria, enuresis, flank pain, frequency, genital sores, hematuria and urgency.  Musculoskeletal: Positive for back pain, gait problem and joint swelling. Negative for arthralgias, neck pain and neck stiffness.  Skin: Negative.  Negative for color change, pallor, rash and wound.  Allergic/Immunologic: Positive for environmental allergies. Negative for food allergies and immunocompromised state.  Neurological: Positive for weakness, light-headedness, numbness and headaches. Negative for dizziness, tremors, seizures, syncope, facial asymmetry and speech difficulty.  Hematological: Negative.  Negative for adenopathy. Does not bruise/bleed easily.  Psychiatric/Behavioral: Negative.  Negative for agitation, behavioral problems, confusion, decreased concentration, dysphoric mood, hallucinations, self-injury, sleep disturbance and suicidal ideas. The patient is not nervous/anxious and is not hyperactive.      Objective: Vital Signs: BP (!) 137/91 (BP Location: Left Arm, Patient Position: Sitting)   Pulse (!) 111   Ht 5\' 9"  (1.753 m)   Wt 230 lb (104.3 kg)   BMI 33.97 kg/m   Physical Exam Constitutional:      Appearance: He is well-developed.  HENT:     Head: Normocephalic and atraumatic.  Eyes:     Pupils: Pupils are equal, round, and reactive to light.  Neck:     Musculoskeletal: Normal range of motion and neck supple.  Pulmonary:     Effort: Pulmonary effort is normal.     Breath sounds: Normal breath sounds.  Abdominal:     General: Bowel sounds are normal.     Palpations: Abdomen is soft.  Skin:    General: Skin is warm and dry.  Neurological:     Mental Status: He is alert and oriented to person, place, and time.  Psychiatric:        Behavior: Behavior normal.        Thought Content: Thought  content normal.        Judgment: Judgment normal.     Back Exam   Tenderness  The patient is experiencing tenderness in the lumbar.  Range of Motion  Extension: abnormal  Flexion: abnormal   Muscle Strength  Right Quadriceps:  5/5  Left Quadriceps:  5/5  Right Hamstrings:  5/5  Left Hamstrings:  5/5   Tests  Straight leg raise right: negative Straight leg raise left: negative  Reflexes  Patellar: Hyporeflexic Achilles: 1/4 Babinski's sign: normal   Other  Toe walk: abnormal Heel walk: abnormal Sensation: decreased Gait: abnormal  Erythema: no back redness Scars: present      Specialty Comments:  No specialty comments available.  Imaging: No results found.   PMFS History: Patient Active Problem List   Diagnosis Date Noted  . Medication monitoring encounter 02/09/2018  . Hardware complicating wound infection (HCC) 01/31/2018  . Spondylolisthesis, lumbar region 12/30/2017  . S/P ankle  fusion 10/20/2017  . Screening for colon cancer 06/29/2017  . Seasonal allergies 06/29/2017  . Post-traumatic osteoarthritis, left ankle and foot 01/07/2017  . Presence of right artificial knee joint 12/17/2016  . Primary osteoarthritis of right knee 05/27/2016  . GERD (gastroesophageal reflux disease) 07/09/2015  . Mild persistent asthma 07/09/2015  . Type 2 diabetes mellitus (HCC) 07/09/2015  . Polyp of duodenum 06/04/2015  . Upper GI bleed 02/18/2015  . Benign essential HTN 02/18/2015  . Anemia of chronic disease 02/18/2015  . Pulmonary nodule 02/18/2015  . Cocaine abuse (HCC) 03/02/2014  . Posttraumatic stress disorder 03/02/2014  . History of CVA (cerebrovascular accident) 01/12/2014  . Schizophrenia (HCC) 01/12/2014  . Tobacco abuse 12/06/2013  . Tobacco dependence syndrome 12/06/2013   Past Medical History:  Diagnosis Date  . Anxiety   . Arthritis   . Asthma   . Duodenal adenoma   . GERD (gastroesophageal reflux disease)   . Hypertension   .  Pneumonia    hx  . Post-traumatic osteoarthritis of left ankle   . Pre-diabetes   . Schizophrenia (HCC)   . Stroke [facility])    2009-or10    Family History  Adopted: Yes  Problem Relation Age of Onset  . Colon cancer Neg Hx   . Esophageal cancer Neg Hx   . Rectal cancer Neg Hx   . Stomach cancer Neg Hx     Past Surgical History:  Procedure Laterality Date  . ANKLE ARTHROSCOPY Left 10/20/2017   Procedure: ANKLE ARTHROSCOPY;  Surgeon: Nadara Mustard, MD;  Location: [facility] OR;  Service: Orthopedics;  Laterality: Left;  . ANKLE FUSION Left 10/20/2017  . ANKLE FUSION Left 10/20/2017   Procedure: LEFT ANKLE FUSION;  Surgeon: Nadara Mustard, MD;  Location: [facility] OR;  Service: Orthopedics;  Laterality: Left;  . ESOPHAGOGASTRODUODENOSCOPY Left 12/05/2013   Procedure: ESOPHAGOGASTRODUODENOSCOPY (EGD);  Surgeon: Willis Modena, MD;  Location: [facility];  Service: Endoscopy;  Laterality: Left;  . ESOPHAGOGASTRODUODENOSCOPY (EGD) WITH PROPOFOL N/A 02/20/2015   Procedure: ESOPHAGOGASTRODUODENOSCOPY (EGD) WITH PROPOFOL;  Surgeon: Charlott Rakes, MD;  Location: [facility] ENDOSCOPY;  Service: Endoscopy;  Laterality: N/A;  . HARDWARE REMOVAL Left 01/29/2018   Procedure: HARDWARE REMOVAL LEFT ANKLE;  Surgeon: Nadara Mustard, MD;  [facility] OR;  Service: Orthopedics;  Laterality: Left;  . TOTAL KNEE ARTHROPLASTY Right 07/21/2016   Procedure: TOTAL KNEE ARTHROPLASTY;  Surgeon: Cammy Copa, MD;  Location: [facility] OR;  Service: Orthopedics;  Laterality: Right;  . UPPER GASTROINTESTINAL ENDOSCOPY     Social History   Occupational History  . Not on file  Tobacco Use  . Smoking status: Heavy Tobacco Smoker    Packs/day: 1.50    Years: 38.00    Pack years: 57.00    Types: Cigarettes  . Smokeless tobacco: Never Used  . Tobacco comment: onset age 58; upto 1.5 ppd  Substance and Sexual Activity  . Alcohol use: Not Currently    Alcohol/week: 3.0 standard drinks    Types: 3 Cans of beer per week    Comment: quit  2015; formerly up to 2 fifths/day  . Drug use: No  . Sexual activity: Yes    Comment: declined condoms

## 2018-07-11 NOTE — Patient Instructions (Signed)
Plan: Avoid bending, stooping and avoid lifting weights greater than 10 lbs. Avoid prolong standing and walking. Avoid frequent bending and stooping  No lifting greater than 10 lbs. May use ice or moist heat for pain. Weight loss is of benefit. Handicap license is approved. MRI of the lumbar spine is ordered. Percocet 7.5 mg one every 4 hours prn pain.  Gabapentin 300 mg ramping up every 3 days to TID.

## 2018-07-18 ENCOUNTER — Other Ambulatory Visit (INDEPENDENT_AMBULATORY_CARE_PROVIDER_SITE_OTHER): Payer: Self-pay

## 2018-07-18 ENCOUNTER — Telehealth (INDEPENDENT_AMBULATORY_CARE_PROVIDER_SITE_OTHER): Payer: Self-pay | Admitting: Specialist

## 2018-07-18 ENCOUNTER — Other Ambulatory Visit (INDEPENDENT_AMBULATORY_CARE_PROVIDER_SITE_OTHER): Payer: Self-pay | Admitting: Specialist

## 2018-07-18 MED ORDER — OXYCODONE-ACETAMINOPHEN 7.5-325 MG PO TABS: 1.0000 | ORAL_TABLET | ORAL | 0 refills | Status: DC | PRN

## 2018-07-18 NOTE — Addendum Note (Signed)
Addended by: Minda Ditto, Geoffery Spruce on: 07/18/2018 11:38 AM   Modules accepted: Orders

## 2018-07-18 NOTE — Telephone Encounter (Signed)
Sent request to Dr. Nitka 

## 2018-07-18 NOTE — Telephone Encounter (Signed)
Patient calling regarding Rx for oxycodone 7.5mg .  He wants something that doesn't have tylenol in it.  Please call to discuss.

## 2018-07-18 NOTE — Telephone Encounter (Signed)
Patient request refill on oxycodone

## 2018-07-21 ENCOUNTER — Other Ambulatory Visit: Payer: Self-pay | Admitting: Family Medicine

## 2018-07-25 ENCOUNTER — Ambulatory Visit (INDEPENDENT_AMBULATORY_CARE_PROVIDER_SITE_OTHER): Payer: Self-pay

## 2018-07-25 ENCOUNTER — Other Ambulatory Visit: Payer: Self-pay

## 2018-07-25 ENCOUNTER — Ambulatory Visit (INDEPENDENT_AMBULATORY_CARE_PROVIDER_SITE_OTHER): Payer: Medicare Other | Admitting: Orthopedic Surgery

## 2018-07-25 ENCOUNTER — Encounter (INDEPENDENT_AMBULATORY_CARE_PROVIDER_SITE_OTHER): Payer: Self-pay | Admitting: Orthopedic Surgery

## 2018-07-25 VITALS — Ht 69.0 in | Wt 230.0 lb

## 2018-07-25 DIAGNOSIS — M25572 Pain in left ankle and joints of left foot: Secondary | ICD-10-CM | POA: Diagnosis not present

## 2018-07-25 DIAGNOSIS — T847XXD Infection and inflammatory reaction due to other internal orthopedic prosthetic devices, implants and grafts, subsequent encounter: Secondary | ICD-10-CM | POA: Diagnosis not present

## 2018-07-25 NOTE — Telephone Encounter (Signed)
Patient here for an appointment with Dr. Sharol Given at [place].  He would like to know the status for Rx oxycodone 7.5mg . He wants something that doesn't have tylenol in it.

## 2018-07-25 NOTE — Telephone Encounter (Signed)
Patient here for an appointment with Dr. Sharol Given at [place].  He would like to know the status for Rx oxycodone 7.5mg . He wants something that doesn't have tylenol in it.

## 2018-07-26 ENCOUNTER — Encounter (INDEPENDENT_AMBULATORY_CARE_PROVIDER_SITE_OTHER): Payer: Self-pay | Admitting: Orthopedic Surgery

## 2018-07-26 ENCOUNTER — Other Ambulatory Visit (INDEPENDENT_AMBULATORY_CARE_PROVIDER_SITE_OTHER): Payer: Self-pay | Admitting: Radiology

## 2018-07-26 ENCOUNTER — Telehealth (INDEPENDENT_AMBULATORY_CARE_PROVIDER_SITE_OTHER): Payer: Self-pay

## 2018-07-26 NOTE — Telephone Encounter (Signed)
Pt's girl friend called and states that someone called pt this morning to advise about a medication refill but that he was so sleepy that he does not remember what was said. I advised that there is a message pending about pain medication sent on Monday but that I di not see anything in his chart that this has been answered. I advised that I would send a message to assistant to follow up with them in the morning.

## 2018-07-26 NOTE — Progress Notes (Signed)
Office Visit Note   Patient: [name]           Date of Birth: 28-Oct-1965           MRN: [id number] Visit Date: 07/25/2018              Requested by: Lovena Neighbours, MD [address] [place], [place] [address] PCP: Lovena Neighbours, MD  Chief Complaint  Patient presents with  . Left Ankle - Follow-up    01/29/18 removal HDW left ankle SLC fibrous nonunion       HPI: Patient is a 53 year old gentleman who presents in follow-up for an infected nonunion of the left ankle he has most recently been in a short leg cast.  He states he has some deep itching but states he has not had any pain since he has been in the cast.  Assessment & Plan: Visit Diagnoses:  1. Pain in left ankle and joints of left foot   2. Hardware complicating wound infection, subsequent encounter     Plan: Patient will advance to weightbearing as tolerated in a fracture boot.  Follow-up in 4 weeks at which time we will repeat radiographs of the left ankle.  Follow-Up Instructions: Return in about 4 weeks (around 08/22/2018).   Ortho Exam  Patient is alert, oriented, no adenopathy, well-dressed, normal affect, normal respiratory effort. Examination patient's foot is plantigrade there is no redness no cellulitis no drainage no tenderness to palpation no signs of infection.  The fusion site is asymptomatic with attempted distraction.  Imaging: No results found. No images are attached to the encounter.  Labs: Lab Results  Component Value Date   HGBA1C 5.6 11/05/2016   HGBA1C 6.0 (H) 07/10/2016   HGBA1C 5.8 04/10/2016   ESRSEDRATE 29 (H) 07/04/2018   ESRSEDRATE 9 03/08/2018   ESRSEDRATE 28 (H) 02/09/2018   CRP 17.1 (H) 07/04/2018   CRP 56.5 (H) 03/08/2018   CRP 9.0 (H) 02/09/2018   LABURIC 5.2 04/17/2014   REPTSTATUS 02/03/2018 FINAL 01/29/2018   GRAMSTAIN  01/29/2018    MODERATE WBC PRESENT,BOTH PMN AND MONONUCLEAR NO ORGANISMS SEEN    CULT  01/29/2018    RARE ENTEROBACTER CLOACAE  CRITICAL RESULT CALLED TO, READ BACK BY AND VERIFIED WITH: Durwin Nora RN, AT 1156 01/31/18 BY D. Leighton Roach REGARDING CULTURE GROWTH NO ANAEROBES ISOLATED Performed at [facility], [address] [place], [place] [address]    LABORGA ENTEROBACTER CLOACAE 01/29/2018     Lab Results  Component Value Date   ALBUMIN 3.4 (L) 01/28/2018   ALBUMIN 4.3 06/29/2017   ALBUMIN 4.1 04/28/2017   LABURIC 5.2 04/17/2014    Body mass index is 33.97 kg/m.  Orders:  Orders Placed This Encounter  Procedures  . XR Ankle Complete Left   No orders of the defined types were placed in this encounter.    Procedures: No procedures performed  Clinical Data: No additional findings.  ROS:  All other systems negative, except as noted in the HPI. Review of Systems  Objective: Vital Signs: Ht 5\' 9"  (1.753 m)   Wt 230 lb (104.3 kg)   BMI 33.97 kg/m   Specialty Comments:  No specialty comments available.  PMFS History: Patient Active Problem List   Diagnosis Date Noted  . Medication monitoring encounter 02/09/2018  . Hardware complicating wound infection (HCC) 01/31/2018  . Spondylolisthesis, lumbar region 12/30/2017  . S/P ankle fusion 10/20/2017  . Screening for colon cancer 06/29/2017  . Seasonal allergies 06/29/2017  .  Post-traumatic osteoarthritis, left ankle and foot 01/07/2017  . Presence of right artificial knee joint 12/17/2016  . Primary osteoarthritis of right knee 05/27/2016  . GERD (gastroesophageal reflux disease) 07/09/2015  . Mild persistent asthma 07/09/2015  . Type 2 diabetes mellitus (HCC) 07/09/2015  . Polyp of duodenum 06/04/2015  . Upper GI bleed 02/18/2015  . Benign essential HTN 02/18/2015  . Anemia of chronic disease 02/18/2015  . Pulmonary nodule 02/18/2015  . Cocaine abuse (HCC) 03/02/2014  . Posttraumatic stress disorder 03/02/2014  . History of CVA (cerebrovascular accident) 01/12/2014  . Schizophrenia (HCC) 01/12/2014  . Tobacco abuse 12/06/2013   . Tobacco dependence syndrome 12/06/2013   Past Medical History:  Diagnosis Date  . Anxiety   . Arthritis   . Asthma   . Duodenal adenoma   . GERD (gastroesophageal reflux disease)   . Hypertension   . Pneumonia    hx  . Post-traumatic osteoarthritis of left ankle   . Pre-diabetes   . Schizophrenia (HCC)   . Stroke [facility])    2009-or10    Family History  Adopted: Yes  Problem Relation Age of Onset  . Colon cancer Neg Hx   . Esophageal cancer Neg Hx   . Rectal cancer Neg Hx   . Stomach cancer Neg Hx     Past Surgical History:  Procedure Laterality Date  . ANKLE ARTHROSCOPY Left 10/20/2017   Procedure: ANKLE ARTHROSCOPY;  Surgeon: Nadara Mustard, MD;  Location: [facility] OR;  Service: Orthopedics;  Laterality: Left;  . ANKLE FUSION Left 10/20/2017  . ANKLE FUSION Left 10/20/2017   Procedure: LEFT ANKLE FUSION;  Surgeon: Nadara Mustard, MD;  Location: [facility] OR;  Service: Orthopedics;  Laterality: Left;  . ESOPHAGOGASTRODUODENOSCOPY Left 12/05/2013   Procedure: ESOPHAGOGASTRODUODENOSCOPY (EGD);  Surgeon: Willis Modena, MD;  Location: [facility];  Service: Endoscopy;  Laterality: Left;  . ESOPHAGOGASTRODUODENOSCOPY (EGD) WITH PROPOFOL N/A 02/20/2015   Procedure: ESOPHAGOGASTRODUODENOSCOPY (EGD) WITH PROPOFOL;  Surgeon: Charlott Rakes, MD;  Location: [facility]COPY;  Service: Endoscopy;  Laterality: N/A;  . HARDWARE REMOVAL Left 01/29/2018   Procedure: HARDWARE REMOVAL LEFT ANKLE;  Surgeon: Nadara Mustard, MD;  Location: [facility] OR;  Service: Orthopedics;  Laterality: Left;  . TOTAL KNEE ARTHROPLASTY Right 07/21/2016   Procedure: TOTAL KNEE ARTHROPLASTY;  Surgeon: Cammy Copa, MD;  Location: [facility] OR;  Service: Orthopedics;  Laterality: Right;  . UPPER GASTROINTESTINAL ENDOSCOPY     Social History   Occupational History  . Not on file  Tobacco Use  . Smoking status: Heavy Tobacco Smoker    Packs/day: 1.50    Years: 38.00    Pack years: 57.00    Types: Cigarettes  . Smokeless tobacco:  Never Used  . Tobacco comment: onset age 32; upto 1.5 ppd  Substance and Sexual Activity  . Alcohol use: Not Currently    Alcohol/week: 3.0 standard drinks    Types: 3 Cans of beer per week    Comment: quit 2015; formerly up to 2 fifths/day  . Drug use: No  . Sexual activity: Yes    Comment: declined condoms

## 2018-07-27 ENCOUNTER — Other Ambulatory Visit (INDEPENDENT_AMBULATORY_CARE_PROVIDER_SITE_OTHER): Payer: Self-pay | Admitting: Specialist

## 2018-07-27 MED ORDER — OXYCODONE-ACETAMINOPHEN 7.5-325 MG PO TABS: 1.0000 | ORAL_TABLET | ORAL | 0 refills | Status: DC | PRN

## 2018-07-27 NOTE — Telephone Encounter (Signed)
I called and lmom that Dr. Louanne Skye will NOT refill the plain Oxycodone, that he will only do Oxycodone with Tylenol.  This has been sent to Dr. Louanne Skye and pending his approval.

## 2018-08-02 ENCOUNTER — Other Ambulatory Visit: Payer: Self-pay | Admitting: Specialist

## 2018-08-02 NOTE — Telephone Encounter (Signed)
Patient called left voicemail message needing Rx refilled (Oxycodone) The number to contact patient is [phone]

## 2018-08-03 MED ORDER — OXYCODONE-ACETAMINOPHEN 7.5-325 MG PO TABS: 1.0000 | ORAL_TABLET | ORAL | 0 refills | Status: DC | PRN

## 2018-08-03 NOTE — Addendum Note (Signed)
Addended by: Minda Ditto, Geoffery Spruce on: 08/03/2018 08:27 AM   Modules accepted: Orders

## 2018-08-08 ENCOUNTER — Ambulatory Visit: Payer: Self-pay | Admitting: Orthopedic Surgery

## 2018-08-10 ENCOUNTER — Other Ambulatory Visit: Payer: Self-pay

## 2018-08-10 ENCOUNTER — Encounter: Payer: Self-pay | Admitting: Specialist

## 2018-08-10 ENCOUNTER — Ambulatory Visit (INDEPENDENT_AMBULATORY_CARE_PROVIDER_SITE_OTHER): Payer: Medicare Other | Admitting: Specialist

## 2018-08-10 VITALS — BP 146/99 | HR 114 | Ht 69.0 in | Wt 230.0 lb

## 2018-08-10 DIAGNOSIS — M48062 Spinal stenosis, lumbar region with neurogenic claudication: Secondary | ICD-10-CM | POA: Diagnosis not present

## 2018-08-10 DIAGNOSIS — M4316 Spondylolisthesis, lumbar region: Secondary | ICD-10-CM | POA: Diagnosis not present

## 2018-08-10 MED ORDER — GABAPENTIN 300 MG PO CAPS: 300.0000 mg | ORAL_CAPSULE | Freq: Three times a day (TID) | ORAL | 0 refills | Status: DC

## 2018-08-10 MED ORDER — OXYCODONE-ACETAMINOPHEN 7.5-325 MG PO TABS: 1.0000 | ORAL_TABLET | ORAL | 0 refills | Status: DC | PRN

## 2018-08-10 NOTE — Patient Instructions (Signed)
Avoid bending, stooping and avoid lifting weights greater than 10 lbs. Avoid prolong standing and walking. Avoid frequent bending and stooping  No lifting greater than 10 lbs. May use ice or moist heat for pain. Weight loss is of benefit. Handicap license is approved. Dr. Newton's secretary/Assistant will call to arrange for epidural steroid injection  

## 2018-08-10 NOTE — Progress Notes (Signed)
Office Visit Note   Patient: [name]           Date of Birth: 03/21/1966           MRN: [id number] Visit Date: 08/10/2018              Requested by: Lovena Neighbours, MD [address] [place], [place] [address] PCP: Lovena Neighbours, MD   Assessment & Plan: Visit Diagnoses:  1. Spinal stenosis of lumbar region with neurogenic claudication   2. Spondylolisthesis, lumbar region     Plan: Avoid bending, stooping and avoid lifting weights greater than 10 lbs. Avoid prolong standing and walking. Avoid frequent bending and stooping  No lifting greater than 10 lbs. May use ice or moist heat for pain. Weight loss is of benefit. Handicap license is approved. Dr. Boyce Blas secretary/Assistant will call to arrange for epidural steroid injection   We are awaiting MRI of the lumbar spine that he reports is scheduled for in 1 1/2 months, Based on the information will decide on intervention. Gabapentin for pain.   Follow-Up Instructions: No follow-ups on file.   Orders:  No orders of the defined types were placed in this encounter.  No orders of the defined types were placed in this encounter.     Procedures: No procedures performed   Clinical Data: No additional findings.   Subjective: Chief Complaint  Patient presents with  . Lower Back - Follow-up    53 year old male with history of lumbar pain and neurogenic claudication in the legs left greater than right. He started medications gabapentin with some improvement. He had left ankle fusion about 4-5 Months ago. He reports the left ankle still swells up and is still painful when he walks on the left leg. No bowel or bladder difficulty. He is unable to ambulate greater than 100-150 feet, here to the parking lot in the back and the legs and left leg site of recent ankle ORIF and infection and now left ankle fusion.    Review of Systems  Constitutional: Positive for activity change, appetite change and unexpected  weight change.  HENT: Positive for congestion.   Eyes: Negative.   Respiratory: Positive for chest tightness, shortness of breath and wheezing.   Cardiovascular: Positive for chest pain.  Gastrointestinal: Positive for nausea. Negative for abdominal distention, abdominal pain, anal bleeding, blood in stool, constipation, diarrhea, rectal pain and vomiting.  Endocrine: Negative for cold intolerance, heat intolerance, polydipsia, polyphagia and polyuria.  Genitourinary: Negative for difficulty urinating, dysuria, enuresis, flank pain and hematuria.  Musculoskeletal: Positive for back pain and gait problem. Negative for arthralgias, joint swelling, myalgias, neck pain and neck stiffness.  Skin: Negative for color change, pallor and rash.  Allergic/Immunologic: Positive for environmental allergies. Negative for food allergies and immunocompromised state.  Neurological: Positive for weakness and numbness. Negative for dizziness, tremors, seizures, syncope, facial asymmetry, speech difficulty, light-headedness and headaches.  Hematological: Negative for adenopathy. Does not bruise/bleed easily.  Psychiatric/Behavioral: Negative for agitation, behavioral problems, confusion, decreased concentration, dysphoric mood, hallucinations, self-injury, sleep disturbance and suicidal ideas. The patient is not nervous/anxious and is not hyperactive.      Objective: Vital Signs: BP (!) 146/99 (BP Location: Left Arm, Patient Position: Sitting)   Pulse (!) 114   Ht 5\' 9"  (1.753 m)   Wt 230 lb (104.3 kg)   BMI 33.97 kg/m   Physical Exam  Back Exam   Tenderness  The patient is experiencing tenderness in the lumbar.  Range  of Motion  Extension: abnormal  Flexion:  50 abnormal  Lateral bend right: abnormal  Lateral bend left: abnormal  Rotation right: abnormal  Rotation left: abnormal   Muscle Strength  Right Quadriceps:  5/5  Left Quadriceps:  4/5  Right Hamstrings:  5/5  Left Hamstrings:  5/5    Reflexes  Patellar: 2/4 Achilles: 0/4 Babinski's sign: normal   Other  Toe walk: normal Heel walk: normal Sensation: normal Gait: normal  Erythema: no back redness Scars: absent  Comments:  Left knee extension 4/5 and left hip flexion 5-/5, has a left AFO walking brace.      Specialty Comments:  No specialty comments available.  Imaging: No results found.   PMFS History: Patient Active Problem List   Diagnosis Date Noted  . Medication monitoring encounter 02/09/2018  . Hardware complicating wound infection (HCC) 01/31/2018  . Spondylolisthesis, lumbar region 12/30/2017  . S/P ankle fusion 10/20/2017  . Screening for colon cancer 06/29/2017  . Seasonal allergies 06/29/2017  . Post-traumatic osteoarthritis, left ankle and foot 01/07/2017  . Presence of right artificial knee joint 12/17/2016  . Primary osteoarthritis of right knee 05/27/2016  . GERD (gastroesophageal reflux disease) 07/09/2015  . Mild persistent asthma 07/09/2015  . Type 2 diabetes mellitus (HCC) 07/09/2015  . Polyp of duodenum 06/04/2015  . Upper GI bleed 02/18/2015  . Benign essential HTN 02/18/2015  . Anemia of chronic disease 02/18/2015  . Pulmonary nodule 02/18/2015  . Cocaine abuse (HCC) 03/02/2014  . Posttraumatic stress disorder 03/02/2014  . History of CVA (cerebrovascular accident) 01/12/2014  . Schizophrenia (HCC) 01/12/2014  . Tobacco abuse 12/06/2013  . Tobacco dependence syndrome 12/06/2013   Past Medical History:  Diagnosis Date  . Anxiety   . Arthritis   . Asthma   . Duodenal adenoma   . GERD (gastroesophageal reflux disease)   . Hypertension   . Pneumonia    hx  . Post-traumatic osteoarthritis of left ankle   . Pre-diabetes   . Schizophrenia (HCC)   . Stroke [facility])    2009-or10    Family History  Adopted: Yes  Problem Relation Age of Onset  . Colon cancer Neg Hx   . Esophageal cancer Neg Hx   . Rectal cancer Neg Hx   . Stomach cancer Neg Hx     Past Surgical  History:  Procedure Laterality Date  . ANKLE ARTHROSCOPY Left 10/20/2017   Procedure: ANKLE ARTHROSCOPY;  Surgeon: Nadara Mustard, MD;  Location: [facility] OR;  Service: Orthopedics;  Laterality: Left;  . ANKLE FUSION Left 10/20/2017  . ANKLE FUSION Left 10/20/2017   Procedure: LEFT ANKLE FUSION;  Surgeon: Nadara Mustard, MD;  Location: [facility] OR;  Service: Orthopedics;  Laterality: Left;  . ESOPHAGOGASTRODUODENOSCOPY Left 12/05/2013   Procedure: ESOPHAGOGASTRODUODENOSCOPY (EGD);  Surgeon: Willis Modena, MD;  Location: [facility];  Service: Endoscopy;  Laterality: Left;  . ESOPHAGOGASTRODUODENOSCOPY (EGD) WITH PROPOFOL N/A 02/20/2015   Procedure: ESOPHAGOGASTRODUODENOSCOPY (EGD) WITH PROPOFOL;  Surgeon: Charlott Rakes, MD;  Location: [facility];  Service: Endoscopy;  Laterality: N/A;  . HARDWARE REMOVAL Left 01/29/2018   Procedure: HARDWARE REMOVAL LEFT ANKLE;  Surgeon: Nadara Mustard, MD;  Location: [facility] OR;  Service: Orthopedics;  Laterality: Left;  . TOTAL KNEE ARTHROPLASTY Right 07/21/2016   Procedure: TOTAL KNEE ARTHROPLASTY;  Surgeon: Cammy Copa, MD;  Location: [facility] OR;  Service: Orthopedics;  Laterality: Right;  . UPPER GASTROINTESTINAL ENDOSCOPY     Social History   Occupational History  . Not on file  Tobacco Use  . Smoking status: Heavy Tobacco Smoker    Packs/day: 1.50    Years: 38.00    Pack years: 57.00    Types: Cigarettes  . Smokeless tobacco: Never Used  . Tobacco comment: onset age 24; upto 1.5 ppd  Substance and Sexual Activity  . Alcohol use: Not Currently    Alcohol/week: 3.0 standard drinks    Types: 3 Cans of beer per week    Comment: quit 2015; formerly up to 2 fifths/day  . Drug use: No  . Sexual activity: Yes    Comment: declined condoms

## 2018-08-11 ENCOUNTER — Other Ambulatory Visit: Payer: Self-pay | Admitting: Family Medicine

## 2018-08-12 ENCOUNTER — Ambulatory Visit: Payer: Self-pay | Admitting: Specialist

## 2018-08-15 ENCOUNTER — Ambulatory Visit: Payer: Medicare Other | Admitting: Orthopedic Surgery

## 2018-08-17 ENCOUNTER — Other Ambulatory Visit: Payer: Self-pay | Admitting: Specialist

## 2018-08-17 MED ORDER — OXYCODONE-ACETAMINOPHEN 7.5-325 MG PO TABS: 1.0000 | ORAL_TABLET | ORAL | 0 refills | Status: DC | PRN

## 2018-08-17 NOTE — Telephone Encounter (Signed)
Patient called needing Rx refilled (Oxycodone) The number to contact patient is [phone]

## 2018-08-24 ENCOUNTER — Telehealth: Payer: Self-pay | Admitting: Specialist

## 2018-08-24 ENCOUNTER — Ambulatory Visit: Payer: Medicare Other | Admitting: Orthopedic Surgery

## 2018-08-24 DIAGNOSIS — F17200 Nicotine dependence, unspecified, uncomplicated: Secondary | ICD-10-CM

## 2018-08-24 DIAGNOSIS — F17210 Nicotine dependence, cigarettes, uncomplicated: Secondary | ICD-10-CM

## 2018-08-24 NOTE — Telephone Encounter (Signed)
Pt is requesting a refill on oxycodone medication.  MRI has been moved to June 9th.

## 2018-08-25 ENCOUNTER — Other Ambulatory Visit: Payer: Self-pay

## 2018-08-25 MED ORDER — OXYCODONE-ACETAMINOPHEN 7.5-325 MG PO TABS: 1.0000 | ORAL_TABLET | ORAL | 0 refills | Status: DC | PRN

## 2018-08-25 NOTE — Telephone Encounter (Signed)
Rx sent to his pharmacy. jen

## 2018-08-25 NOTE — Addendum Note (Signed)
Addended by: Minda Ditto, Geoffery Spruce on: 08/25/2018 09:47 AM   Modules accepted: Orders

## 2018-08-25 NOTE — Telephone Encounter (Signed)
Patient wants refill on pain medication [phone]

## 2018-08-29 ENCOUNTER — Ambulatory Visit: Payer: Medicare Other | Admitting: Orthopedic Surgery

## 2018-08-29 ENCOUNTER — Other Ambulatory Visit: Payer: Self-pay

## 2018-09-01 ENCOUNTER — Telehealth: Payer: Self-pay | Admitting: Specialist

## 2018-09-01 ENCOUNTER — Other Ambulatory Visit: Payer: Self-pay

## 2018-09-01 MED ORDER — OXYCODONE-ACETAMINOPHEN 7.5-325 MG PO TABS: 1.0000 | ORAL_TABLET | ORAL | 0 refills | Status: DC | PRN

## 2018-09-01 NOTE — Telephone Encounter (Signed)
[name] called for patient requesting his refill be done today because they have transportation that can take him to pick it up.  Oxycodone

## 2018-09-01 NOTE — Telephone Encounter (Signed)
[name] is calling requesting a refill on oxyCODONE-acetaminophen (PERCOCET) 7.5-325 MG tablet to [facility], please advise?

## 2018-09-02 ENCOUNTER — Telehealth: Payer: Self-pay | Admitting: Specialist

## 2018-09-02 ENCOUNTER — Telehealth: Payer: Self-pay | Admitting: Orthopedic Surgery

## 2018-09-02 NOTE — Telephone Encounter (Signed)
[name] back, I advised to her that when he saw Dr. Louanne Skye last, Dr. Louanne Skye ordered ESI's for him, and Dr. Romona Curls Staff called him to schedule the appt.  She states that he did not tell her what was going on.  But she understands now.

## 2018-09-02 NOTE — Telephone Encounter (Signed)
Called pt and he states that he has had increased pain and swelling of the ankle unable to weight bear and that it feels the same way that it did when his ankle was infected the last time. I advised that feeling that poorly he should go to the [facility] ER for eval that I would let Dr. Sharol Given know that this is what I have advised him to do. Patient says that he may go later on today.

## 2018-09-02 NOTE — Telephone Encounter (Signed)
I tried to call but no answer and no machine I will try again later

## 2018-09-02 NOTE — Telephone Encounter (Signed)
[name] called stating patient's left ankle is swollen and is worried it might be infected.  She states patient ran a fever of 103.0 last night which she states only lasted an hour after taking Naproxyn.  She states today he only has a low grade fever 99 and is requesting a prescription of antibiotics to be sent to [facility] at [place].  [name] refused to be transferred to Triage stating she just needed an antibiotic to be called into the pharmacy.

## 2018-09-02 NOTE — Telephone Encounter (Signed)
[name] called stating patient was unaware that he had an appointment scheduled with Dr. Ernestina Patches for injections.  Patient is requesting a return call.

## 2018-09-02 NOTE — Telephone Encounter (Signed)
Meds were sent in @ 5:34pm

## 2018-09-06 ENCOUNTER — Other Ambulatory Visit: Payer: Medicare Other

## 2018-09-07 ENCOUNTER — Encounter: Payer: Medicare Other | Admitting: Physical Medicine and Rehabilitation

## 2018-09-07 ENCOUNTER — Ambulatory Visit: Payer: Medicare Other | Admitting: Specialist

## 2018-09-08 ENCOUNTER — Other Ambulatory Visit: Payer: Self-pay | Admitting: Specialist

## 2018-09-08 MED ORDER — OXYCODONE-ACETAMINOPHEN 7.5-325 MG PO TABS: 1.0000 | ORAL_TABLET | ORAL | 0 refills | Status: DC | PRN

## 2018-09-08 NOTE — Telephone Encounter (Signed)
Patient called needing Rx refilled (Oxycodone) Patient uses the [facility]   The number to contact patient is [phone]

## 2018-09-12 ENCOUNTER — Ambulatory Visit: Payer: Medicare Other | Admitting: Orthopedic Surgery

## 2018-09-15 ENCOUNTER — Telehealth: Payer: Self-pay | Admitting: Specialist

## 2018-09-15 ENCOUNTER — Other Ambulatory Visit: Payer: Self-pay | Admitting: Specialist

## 2018-09-15 MED ORDER — OXYCODONE-ACETAMINOPHEN 7.5-325 MG PO TABS: 1.0000 | ORAL_TABLET | Freq: Two times a day (BID) | ORAL | 0 refills | Status: DC | PRN

## 2018-09-15 NOTE — Telephone Encounter (Signed)
Rx refill Oxycodone °

## 2018-09-15 NOTE — Telephone Encounter (Signed)
Pt called in requesting a refill for Oxycodone and please have that sent to [facility] on [address] [phone]

## 2018-09-16 ENCOUNTER — Telehealth: Payer: Self-pay | Admitting: Specialist

## 2018-09-16 NOTE — Telephone Encounter (Signed)
Returned call back to patient left message to call back    Note for Rx refill was sent to [facility] and waiting for a response from Dr Louanne Skye

## 2018-09-19 ENCOUNTER — Encounter: Payer: Self-pay | Admitting: Orthopedic Surgery

## 2018-09-19 ENCOUNTER — Other Ambulatory Visit: Payer: Self-pay | Admitting: Radiology

## 2018-09-19 ENCOUNTER — Other Ambulatory Visit: Payer: Self-pay

## 2018-09-19 ENCOUNTER — Ambulatory Visit (INDEPENDENT_AMBULATORY_CARE_PROVIDER_SITE_OTHER): Payer: Medicare Other | Admitting: Physician Assistant

## 2018-09-19 ENCOUNTER — Ambulatory Visit (INDEPENDENT_AMBULATORY_CARE_PROVIDER_SITE_OTHER): Payer: Medicare Other

## 2018-09-19 VITALS — Ht 69.0 in | Wt 230.0 lb

## 2018-09-19 DIAGNOSIS — M25572 Pain in left ankle and joints of left foot: Secondary | ICD-10-CM | POA: Diagnosis not present

## 2018-09-19 DIAGNOSIS — T847XXS Infection and inflammatory reaction due to other internal orthopedic prosthetic devices, implants and grafts, sequela: Secondary | ICD-10-CM

## 2018-09-19 MED ORDER — OXYCODONE-ACETAMINOPHEN 7.5-325 MG PO TABS: 1.0000 | ORAL_TABLET | Freq: Two times a day (BID) | ORAL | 0 refills | Status: DC | PRN

## 2018-09-19 NOTE — Progress Notes (Signed)
Office Visit Note   Patient: [name]           Date of Birth: [birth year]           MRN: [id number] Visit Date: 09/19/2018              Requested by: Brian Neighbours, MD [address] [place],  [place] [address] PCP: Brian Neighbours, MD  Chief Complaint  Patient presents with  . 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 He has been in a fracture boot and weight bearing as tolerated. He comes in today in a regular shoe and reports his ankle is much improved with decreased pain and swelling. He notes some numbness over the toes.   Assessment & Plan: Visit Diagnoses:  1. Pain in left ankle and joints of left foot   2. Hardware complicating wound infection, sequela     Plan: Continue weight bearing as tolerated and may wear regular stiff soled shoe to tolerance.  Follow up in 4 weeks with x rays of the left ankle.   Follow-Up Instructions: Return in about 4 weeks (around 10/17/2018).   Ortho Exam  Patient is alert, oriented, no adenopathy, well-dressed, normal affect, normal respiratory effort. Left ankle with mild localized edema about the ankle only. No erythema or increased warmth. No other signs for infection. Foot is plantigrade.   Imaging: Xr Ankle Complete Left  Result Date: 09/19/2018 Radiographs of the left ankle shows interval healing   No images are attached to the encounter.  Labs: Lab Results  Component Value Date   HGBA1C 5.6 11/05/2016   HGBA1C 6.0 (H) 07/10/2016   HGBA1C 5.8 04/10/2016   ESRSEDRATE 29 (H) 07/04/2018   ESRSEDRATE 9 03/08/2018   ESRSEDRATE 28 (H) 02/09/2018   CRP 17.1 (H) 07/04/2018   CRP 56.5 (H) 03/08/2018   CRP 9.0 (H) 02/09/2018   LABURIC 5.2 04/17/2014   REPTSTATUS 02/03/2018 FINAL 01/29/2018   GRAMSTAIN  01/29/2018    MODERATE WBC PRESENT,BOTH PMN AND MONONUCLEAR NO ORGANISMS SEEN    CULT  01/29/2018   RARE ENTEROBACTER CLOACAE CRITICAL RESULT CALLED TO, READ BACK BY AND VERIFIED WITH: Durwin Nora RN, AT 1156 01/31/18 BY D. Leighton Roach REGARDING CULTURE GROWTH NO ANAEROBES ISOLATED Performed at [facility], [address], [place], [place] [address]    LABORGA ENTEROBACTER CLOACAE 01/29/2018     Lab Results  Component Value Date   ALBUMIN 3.4 (L) 01/28/2018   ALBUMIN 4.3 06/29/2017   ALBUMIN 4.1 04/28/2017   LABURIC 5.2 04/17/2014    Body mass index is 33.97 kg/m.  Orders:  Orders Placed This Encounter  Procedures  . XR Ankle Complete Left   No orders of the defined types were placed in this encounter.    Procedures: No procedures performed  Clinical Data: No additional findings.  ROS:  All other systems negative, except as noted in the HPI. Review of Systems  Objective: Vital Signs: Ht 5\' 9"  (1.753 m)   Wt 230 lb (104.3 kg)   BMI 33.97 kg/m   Specialty Comments:  No specialty comments available.  PMFS History: Patient Active Problem List   Diagnosis Date Noted  . Medication monitoring encounter 02/09/2018  . Hardware complicating wound infection (HCC) 01/31/2018  . Spondylolisthesis, lumbar region 12/30/2017  . S/P ankle fusion 10/20/2017  . Screening for  colon cancer 06/29/2017  . Seasonal allergies 06/29/2017  . Post-traumatic osteoarthritis, left ankle and foot 01/07/2017  . Presence of right artificial knee joint 12/17/2016  . Primary osteoarthritis of right knee 05/27/2016  . GERD (gastroesophageal reflux disease) 07/09/2015  . Mild persistent asthma 07/09/2015  . Type 2 diabetes mellitus (HCC) 07/09/2015  . Polyp of duodenum 06/04/2015  . Upper GI bleed 02/18/2015  . Benign essential HTN 02/18/2015  . Anemia of chronic disease 02/18/2015  . Pulmonary nodule 02/18/2015  . Cocaine abuse (HCC) 03/02/2014  . Posttraumatic stress disorder 03/02/2014  . History of CVA (cerebrovascular accident) 01/12/2014  . Schizophrenia (HCC) 01/12/2014  .  Tobacco abuse 12/06/2013  . Tobacco dependence syndrome 12/06/2013   Past Medical History:  Diagnosis Date  . Anxiety   . Arthritis   . Asthma   . Duodenal adenoma   . GERD (gastroesophageal reflux disease)   . Hypertension   . Pneumonia    hx  . Post-traumatic osteoarthritis of left ankle   . Pre-diabetes   . Schizophrenia (HCC)   . Stroke [facility])    2009-or10    Family History  Adopted: Yes  Problem Relation Age of Onset  . Colon cancer Neg Hx   . Esophageal cancer Neg Hx   . Rectal cancer Neg Hx   . Stomach cancer Neg Hx     Past Surgical History:  Procedure Laterality Date  . ANKLE ARTHROSCOPY Left 10/20/2017   Procedure: ANKLE ARTHROSCOPY;  Surgeon: Nadara Mustard, MD;  Location: [facility] OR;  Service: Orthopedics;  Laterality: Left;  . ANKLE FUSION Left 10/20/2017  . ANKLE FUSION Left 10/20/2017   Procedure: LEFT ANKLE FUSION;  Surgeon: Nadara Mustard, MD;  Location: [facility] OR;  Service: Orthopedics;  Laterality: Left;  . ESOPHAGOGASTRODUODENOSCOPY Left 12/05/2013   Procedure: ESOPHAGOGASTRODUODENOSCOPY (EGD);  Surgeon: Willis Modena, MD;  Location: [facility];  Service: Endoscopy;  Laterality: Left;  . ESOPHAGOGASTRODUODENOSCOPY (EGD) WITH PROPOFOL N/A 02/20/2015   Procedure: ESOPHAGOGASTRODUODENOSCOPY (EGD) WITH PROPOFOL;  Surgeon: Charlott Rakes, MD;  Location: [facility];  Service: Endoscopy;  Laterality: N/A;  . HARDWARE REMOVAL Left 01/29/2018   Procedure: HARDWARE REMOVAL LEFT ANKLE;  Surgeon: Nadara Mustard, MD;  Location: [facility] OR;  Service: Orthopedics;  Laterality: Left;  . TOTAL KNEE ARTHROPLASTY Right 07/21/2016   Procedure: TOTAL KNEE ARTHROPLASTY;  Surgeon: Cammy Copa, MD;  Location: [facility] OR;  Service: Orthopedics;  Laterality: Right;  . UPPER GASTROINTESTINAL ENDOSCOPY     Social History   Occupational History  . Not on file  Tobacco Use  . Smoking status: Heavy Tobacco Smoker    Packs/day: 1.50    Years: 38.00    Pack years: 57.00    Types:  Cigarettes  . Smokeless tobacco: Never Used  . Tobacco comment: onset age 2; upto 1.5 ppd  Substance and Sexual Activity  . Alcohol use: Not Currently    Alcohol/week: 3.0 standard drinks    Types: 3 Cans of beer per week    Comment: quit 2015; formerly up to 2 fifths/day  . Drug use: No  . Sexual activity: Yes    Comment: declined condoms

## 2018-09-19 NOTE — Telephone Encounter (Signed)
I spoke with Jeneen Rinks, meds were approved on Thursday but rx was never signed and given to patient, per Jeneen Rinks ok to reprint and I did so, Jeneen Rinks signed and pt is aware his rx is at the front desk for pick up

## 2018-09-21 ENCOUNTER — Ambulatory Visit
Admission: RE | Admit: 2018-09-21 | Discharge: 2018-09-21 | Disposition: A | Discharge status: Home / Self Care | Payer: Medicare Other | Source: Ambulatory Visit | Attending: Specialist | Admitting: Specialist

## 2018-09-21 ENCOUNTER — Other Ambulatory Visit: Payer: Self-pay

## 2018-09-21 DIAGNOSIS — M4807 Spinal stenosis, lumbosacral region: Secondary | ICD-10-CM

## 2018-09-21 DIAGNOSIS — M4316 Spondylolisthesis, lumbar region: Secondary | ICD-10-CM

## 2018-09-22 ENCOUNTER — Ambulatory Visit: Payer: Medicare Other | Admitting: Specialist

## 2018-09-23 ENCOUNTER — Other Ambulatory Visit (INDEPENDENT_AMBULATORY_CARE_PROVIDER_SITE_OTHER): Payer: Self-pay | Admitting: Specialist

## 2018-09-26 ENCOUNTER — Other Ambulatory Visit: Payer: Self-pay | Admitting: Specialist

## 2018-09-26 NOTE — Telephone Encounter (Signed)
Sent request to [facility]

## 2018-09-26 NOTE — Telephone Encounter (Signed)
Patient called and requested Oxycodone refill.  PLease call patient @ [phone]

## 2018-09-27 NOTE — Telephone Encounter (Signed)
Pt is aware of medication status.

## 2018-09-27 NOTE — Telephone Encounter (Signed)
This is too early to be filled per my previous instructions.

## 2018-09-28 ENCOUNTER — Other Ambulatory Visit: Payer: Self-pay

## 2018-09-28 ENCOUNTER — Other Ambulatory Visit: Payer: Self-pay | Admitting: Specialist

## 2018-09-28 NOTE — Telephone Encounter (Signed)
Patient would like a Rx refill on Oxycodone.  Cb# is [phone].  Please advise.  Thank You.

## 2018-09-28 NOTE — Telephone Encounter (Signed)
Gabapentin refill

## 2018-09-29 ENCOUNTER — Telehealth: Payer: Self-pay | Admitting: *Deleted

## 2018-09-29 NOTE — Telephone Encounter (Signed)
Sent request to [facility] 

## 2018-09-29 NOTE — Addendum Note (Signed)
Addended by: Minda Ditto, Geoffery Spruce on: 09/29/2018 02:29 PM   Modules accepted: Orders

## 2018-10-03 ENCOUNTER — Other Ambulatory Visit: Payer: Self-pay

## 2018-10-03 ENCOUNTER — Telehealth: Payer: Self-pay | Admitting: Orthopedic Surgery

## 2018-10-03 NOTE — Telephone Encounter (Signed)
I called and spoke with [name] and advised that his directions for the Oxycodone was changed on 09/19/2018 refill to take 1 post q 12 hours. And he did not follow these instructions.  I advised [name] that he needs to take all meds as directed on EACH bottle and they need to be check each bottle at every refill.  I did advise that I sent a refill request to Dr. Louanne Skye however he will not be in the office till Wednesday and they he will have access to this info on Tuesday.

## 2018-10-03 NOTE — Telephone Encounter (Signed)
Pt's girlfriend called to check the status of request for pain medication. Please call.

## 2018-10-03 NOTE — Telephone Encounter (Signed)
Ms. [name] called to inquire about Mr. [name] pain medication refill. She states there was some confusion on the directions and now he needs a refill.  [facility] uses [facility] [address].

## 2018-10-03 NOTE — Addendum Note (Signed)
Addended by: Minda Ditto, Alyse Low N on: 10/03/2018 03:01 PM   Modules accepted: Orders

## 2018-10-04 MED ORDER — OXYCODONE-ACETAMINOPHEN 7.5-325 MG PO TABS: 1.0000 | ORAL_TABLET | Freq: Two times a day (BID) | ORAL | 0 refills | Status: DC | PRN

## 2018-10-04 NOTE — Telephone Encounter (Signed)
I called and advised that his rx was approved

## 2018-10-06 ENCOUNTER — Telehealth: Payer: Self-pay | Admitting: Specialist

## 2018-10-06 NOTE — Telephone Encounter (Signed)
Patient called to inquire about his refill. Stated that he thought some one would have addressed this by now.  Advised [name] was seeing patients and someone will call him once [name] becomes available.  Please call patient.

## 2018-10-07 ENCOUNTER — Telehealth: Payer: Self-pay | Admitting: Specialist

## 2018-10-07 ENCOUNTER — Other Ambulatory Visit (INDEPENDENT_AMBULATORY_CARE_PROVIDER_SITE_OTHER): Payer: Self-pay | Admitting: Physician Assistant

## 2018-10-07 DIAGNOSIS — T847XXD Infection and inflammatory reaction due to other internal orthopedic prosthetic devices, implants and grafts, subsequent encounter: Secondary | ICD-10-CM

## 2018-10-07 NOTE — Telephone Encounter (Signed)
Please call this patient and explain what is going on with refill please.

## 2018-10-07 NOTE — Telephone Encounter (Signed)
Talked with patient and advised him that a message was sent to Dr. Louanne Skye to resend Rx for Oxycodone to the pharmacy, due to pharmacy not receiving Rx on 10/04/2018.

## 2018-10-07 NOTE — Telephone Encounter (Signed)
LMOM for patient letting him know this was sent in for him on the 7th to his pharmacy

## 2018-10-07 NOTE — Telephone Encounter (Signed)
Pt called in said the pharmacy has not received the approval to refill his oxycodone. He uses the [facility] on [address].   [phone]

## 2018-10-07 NOTE — Telephone Encounter (Signed)
Can you resend the oxycodone please I did call and confirm with the pharmacist, the did not receive the Rx on 10/04/18

## 2018-10-10 ENCOUNTER — Other Ambulatory Visit: Payer: Self-pay | Admitting: Specialist

## 2018-10-10 NOTE — Telephone Encounter (Signed)
[name] has called about his refill. They are requesting a call back so they can know what's going on with this Rx.  [phone] or Carol's cell # [phone]  Stating someone called them thoday. No Note on either acct.

## 2018-10-10 NOTE — Telephone Encounter (Signed)
Sent request to Dr. Louanne Skye for this

## 2018-10-11 ENCOUNTER — Telehealth: Payer: Self-pay | Admitting: Specialist

## 2018-10-11 ENCOUNTER — Telehealth: Payer: Self-pay | Admitting: Radiology

## 2018-10-11 ENCOUNTER — Other Ambulatory Visit: Payer: Self-pay | Admitting: Specialist

## 2018-10-11 MED ORDER — OXYCODONE-ACETAMINOPHEN 7.5-325 MG PO TABS: 1.0000 | ORAL_TABLET | Freq: Two times a day (BID) | ORAL | 0 refills | Status: DC | PRN

## 2018-10-11 NOTE — Telephone Encounter (Signed)
I called and advised patient that he needs to be seen for MRI Review before we can refill his pain meds.  He has been sched for 10/13/2018

## 2018-10-11 NOTE — Telephone Encounter (Signed)
I called and advised that his meds were sent in and that he needs to keep the appt so that we can figure out what to do about his back.

## 2018-10-11 NOTE — Telephone Encounter (Signed)
See other messages regarding needing appt for MRI Review first

## 2018-10-11 NOTE — Telephone Encounter (Signed)
-----   Message from Jessy Oto, MD sent at 10/10/2018  6:49 PM EDT ----- This patient needs a return appt scheduled before I will renew his narcotic. This is to go over the results of his MRI that was finally done and schedule for surgery so he can stop using narcotics. jen

## 2018-10-11 NOTE — Telephone Encounter (Signed)
I called and advised patient that Dr. Louanne Skye wanted him to have a follow up appt made to review his MRI, this has been sched for 10/13/2018. I advised I would ask Dr. Louanne Skye about the refill this afternoon since he does have an appt scheduled.

## 2018-10-11 NOTE — Telephone Encounter (Signed)
Patient and [name] has called in regards to refill of medication. Stating havent heard from no one and he is coming with [name] to her appt.  Please call patient [phone]

## 2018-10-13 ENCOUNTER — Ambulatory Visit (INDEPENDENT_AMBULATORY_CARE_PROVIDER_SITE_OTHER): Payer: Medicare Other | Admitting: Specialist

## 2018-10-13 ENCOUNTER — Ambulatory Visit: Payer: Self-pay

## 2018-10-13 ENCOUNTER — Encounter: Payer: Self-pay | Admitting: Specialist

## 2018-10-13 ENCOUNTER — Other Ambulatory Visit: Payer: Self-pay

## 2018-10-13 VITALS — BP 116/79 | HR 113 | Ht 69.0 in | Wt 230.0 lb

## 2018-10-13 DIAGNOSIS — M5116 Intervertebral disc disorders with radiculopathy, lumbar region: Secondary | ICD-10-CM

## 2018-10-13 DIAGNOSIS — Z8673 Personal history of transient ischemic attack (TIA), and cerebral infarction without residual deficits: Secondary | ICD-10-CM

## 2018-10-13 DIAGNOSIS — M48062 Spinal stenosis, lumbar region with neurogenic claudication: Secondary | ICD-10-CM

## 2018-10-13 DIAGNOSIS — M4316 Spondylolisthesis, lumbar region: Secondary | ICD-10-CM

## 2018-10-13 DIAGNOSIS — G8194 Hemiplegia, unspecified affecting left nondominant side: Secondary | ICD-10-CM

## 2018-10-13 DIAGNOSIS — M5126 Other intervertebral disc displacement, lumbar region: Secondary | ICD-10-CM

## 2018-10-13 DIAGNOSIS — M25552 Pain in left hip: Secondary | ICD-10-CM | POA: Diagnosis not present

## 2018-10-13 NOTE — Patient Instructions (Addendum)
Avoid bending, stooping and avoid lifting weights greater than 10 lbs. Avoid prolong standing and walking. Order for a new walker with wheels. Surgery scheduling secretary Kandice Hams, will call you in the next week to schedule for surgery.  Surgery recommended is a two level lumbar fusion L4-5 and L3-4 this would be done with rods, screws and cages with local bone graft and allograft (donor bone graft). Take hydrocodone for for pain. Risk of surgery includes risk of infection 1 in 200 patients, bleeding 1/2% chance you would need a transfusion.   Risk to the nerves is one in 10,000. You will need to use a brace for 3 months and wean from the brace on the 4th month. Expect improved walking and standing tolerance. Expect relief of leg pain but numbness may persist depending on the length and degree of pressure that has been present. MRI if the left hip to assess for nondisplaced left hip injury prior to surgery on the lumbar spine for severe spinal stensosis associated with spondylolisthesis L4-5 and left L3-4 microdiscectomy. I will call with the results of the MRI of the left hip.

## 2018-10-13 NOTE — Progress Notes (Signed)
Office Visit Note   Patient: [name]           Date of Birth: [birth year]           MRN: [id number] Visit Date: 10/13/2018              Requested by: Lovena Neighbours, MD No address on file PCP: [name], MD   Assessment & Plan: Visit Diagnoses:  1. Pain of left hip joint   2. History of CVA (cerebrovascular accident)   3. Left hemiparesis (HCC)   4. Spondylolisthesis, lumbar region   5. Spinal stenosis of lumbar region with neurogenic claudication   6. Herniation of nucleus pulposus of lumbar intervertebral disc with sciatica     Plan:Avoid bending, stooping and avoid lifting weights greater than 10 lbs. Avoid prolong standing and walking. Order for a new walker with wheels. Surgery scheduling secretary Tivis Ringer, will call you in the next week to schedule for surgery.  Surgery recommended is a two level lumbar fusion L4-5 and L3-4 this would be done with rods, screws and cages with local bone graft and allograft (donor bone graft). Take hydrocodone for for pain. Risk of surgery includes risk of infection 1 in 200 patients, bleeding 1/2% chance you would need a transfusion.   Risk to the nerves is one in 10,000. You will need to use a brace for 3 months and wean from the brace on the 4th month. Expect improved walking and standing tolerance. Expect relief of leg pain but numbness may persist depending on the length and degree of pressure that has been present. MRI if the left hip to assess for nondisplaced left hip injury prior to surgery on the lumbar spine for severe spinal stensosis associated with spondylolisthesis L4-5 and left L3-4 microdiscectomy. I will call with the results of the MRI of the left hip.    Follow-Up Instructions: Return in about 4 weeks (around 11/10/2018).   Orders:  Orders Placed This Encounter  Procedures  . XR HIP UNILAT W OR W/O PELVIS 2-3 VIEWS LEFT  . MR Hip Left w/o contrast   No orders of the defined types were placed  in this encounter.     Procedures: No procedures performed   Clinical Data: Findings:  CLINICAL DATA:  Chronic left side low back pain radiating into the left leg, worsening. No known injury.  EXAM: MRI LUMBAR SPINE WITHOUT CONTRAST  TECHNIQUE: Multiplanar, multisequence MR imaging of the lumbar spine was performed. No intravenous contrast was administered.  COMPARISON:  Plain films lumbar spine from [facility] 07/11/2018. CT abdomen and pelvis 04/10/2016.  FINDINGS: Segmentation: The patient has transitional lumbosacral anatomy with partial sacralization of the lowest lumbar vertebral body on the left. Numbering scheme on this examination is based on the lowest pair of ribs on the prior studies.  Alignment: Facet degenerative disease results in 0.5 cm anterolisthesis L4 on L5. Mild convex left scoliosis noted.  Vertebrae:  No fracture or worrisome lesion.  Conus medullaris and cauda equina: Conus extends to the L1 level. Conus and cauda equina appear normal.  Paraspinal and other soft tissues: Negative.  Disc levels:  T10-11 and T11-12 are imaged in the sagittal plane only and negative.  T12-L1: Negative.  L1-2: Negative.  L2-3: There is a shallow disc bulge and small right subarticular recess and foraminal protrusion. Mild narrowing is seen in the right subarticular recess and foramen. The left foramen is open.  L3-4: Shallow disc bulge more prominent to the left.  Focal extrusion in the left subarticular recess could impact the descending left L4 root. The foramina are open.  L4-5: Bilateral facet degenerative change is present with associated facet joint effusions, larger on the right. The disc is uncovered and bulging and there is ligamentum flavum thickening. Severe central canal, bilateral subarticular recess and left foraminal narrowing are seen. There is moderately severe right foraminal narrowing.  L5-S1: Transitional  segment.  No stenosis.  IMPRESSION: Transitional lumbosacral anatomy. Please see numbering scheme above and correlate with plain films if intervention is planned.  Severe central canal, bilateral subarticular recess and left foraminal narrowing at L4-5 where advanced facet degenerative disease results in 0.5 cm anterolisthesis.  Disc bulge with a superimposed left subarticular recess extrusion at L3-4. The extrusion could impact the descending left L4 root.   Electronically Signed   By: Drusilla Kanner M.D.   On: 09/21/2018 14:29     Subjective: Chief Complaint  Patient presents with  . Lower Back - Follow-up    MRI Review Lumbar without  . Left Hip - Pain, Injury    Left leg went out on him and he fell down some steps    53 year old male with history of back pain and difficulty standing and walking. He does not have diabetes and has foot changes with left ankle fusion, and right TKR. The left knee is in need of TKR. Dr. August Saucer performed the right TKR.  He underwent recent MRI for concerns of leg weakness and neurogenic claudication. The MRI was delayed due to COVID-19 pandemic and only recently performed on 6/24. He reports that he caught his right foot and has numbness on the top and bottom of the left foot. He can feel the right leg better than the left. Bowel and bladder is functioning normally. He is unable to walk further than to the parking lot or less about 100'. The pain is worsening in the back and the legs. Hard to get out of bed.    Review of Systems  Constitutional: Positive for activity change. Negative for appetite change, chills, diaphoresis, fatigue, fever and unexpected weight change.  HENT: Negative for congestion, dental problem, drooling, ear discharge, ear pain, facial swelling, hearing loss, mouth sores, nosebleeds, postnasal drip, rhinorrhea, sinus pressure, sinus pain, sneezing, sore throat, tinnitus, trouble swallowing and voice change.   Eyes:  Positive for visual disturbance. Negative for photophobia, pain, discharge, redness and itching.  Respiratory: Negative for apnea, cough, choking, chest tightness, shortness of breath, wheezing and stridor.   Cardiovascular: Negative.  Negative for chest pain, palpitations and leg swelling.  Gastrointestinal: Negative.  Negative for abdominal distention, abdominal pain, anal bleeding, blood in stool, constipation, diarrhea, nausea, rectal pain and vomiting.  Endocrine: Negative for polydipsia, polyphagia and polyuria.  Genitourinary: Negative for difficulty urinating, dysuria, flank pain, frequency, hematuria and urgency.  Musculoskeletal: Positive for arthralgias, back pain, gait problem and neck stiffness. Negative for joint swelling, myalgias and neck pain.  Skin: Negative for color change, pallor, rash and wound.  Allergic/Immunologic: Negative for environmental allergies, food allergies and immunocompromised state.  Neurological: Positive for weakness and numbness. Negative for dizziness, tremors, seizures, syncope, facial asymmetry, speech difficulty, light-headedness and headaches.  Hematological: Negative for adenopathy. Does not bruise/bleed easily.  Psychiatric/Behavioral: Negative for agitation, behavioral problems, confusion, decreased concentration, dysphoric mood, hallucinations, self-injury, sleep disturbance and suicidal ideas. The patient is not nervous/anxious and is not hyperactive.      Objective: Vital Signs: BP 116/79 (BP Location: Left Arm, Patient Position:  Sitting)   Pulse (!) 113   Ht 5\' 9"  (1.753 m)   Wt 230 lb (104.3 kg)   BMI 33.97 kg/m   Physical Exam  Left Hip Exam   Tenderness  The patient is experiencing tenderness in the anterior.  Range of Motion  Abduction: abnormal  Adduction: abnormal  Flexion: normal  External rotation: normal  Internal rotation: abnormal   Muscle Strength  Abduction: 4/5  Adduction: 4/5  Flexion: 4/5   Tests  FABER:  positive Ober: positive  Other  Erythema: absent Scars: absent Sensation: normal Pulse: present  Comments:  Tender left groin anteriorly post fall   Back Exam   Tenderness  The patient is experiencing tenderness in the lumbar.  Range of Motion  Extension: abnormal  Flexion: abnormal  Lateral bend right: abnormal  Lateral bend left: abnormal  Rotation right: abnormal  Rotation left: abnormal   Muscle Strength  Right Quadriceps:  5/5  Left Quadriceps:  5/5  Right Hamstrings:  5/5  Left Hamstrings:  5/5   Reflexes  Patellar: 0/4 Achilles: 0/4 Babinski's sign: normal   Other  Toe walk: normal Heel walk: normal Sensation: decreased Gait: abnormal  Erythema: no back redness Scars: absent      Specialty Comments:  No specialty comments available.  Imaging: Xr Hip Unilat W Or W/o Pelvis 2-3 Views Left  Result Date: 10/13/2018 AP and lateral radiographs of the left hip show a questionable lucency left superior pubic ramus, also in the area of the left low intertrochanteric region, the intertroch concern not verified on lateral radiograph the left superior pubic ramus lucency is ?able on both views.     PMFS History: Patient Active Problem List   Diagnosis Date Noted  . Medication monitoring encounter 02/09/2018  . Hardware complicating wound infection (HCC) 01/31/2018  . Spondylolisthesis, lumbar region 12/30/2017  . S/P ankle fusion 10/20/2017  . Screening for colon cancer 06/29/2017  . Seasonal allergies 06/29/2017  . Post-traumatic osteoarthritis, left ankle and foot 01/07/2017  . Presence of right artificial knee joint 12/17/2016  . Primary osteoarthritis of right knee 05/27/2016  . GERD (gastroesophageal reflux disease) 07/09/2015  . Mild persistent asthma 07/09/2015  . Type 2 diabetes mellitus (HCC) 07/09/2015  . Polyp of duodenum 06/04/2015  . Upper GI bleed 02/18/2015  . Benign essential HTN 02/18/2015  . Anemia of chronic disease 02/18/2015   . Pulmonary nodule 02/18/2015  . Cocaine abuse (HCC) 03/02/2014  . Posttraumatic stress disorder 03/02/2014  . History of CVA (cerebrovascular accident) 01/12/2014  . Schizophrenia (HCC) 01/12/2014  . Tobacco abuse 12/06/2013  . Tobacco dependence syndrome 12/06/2013   Past Medical History:  Diagnosis Date  . Anxiety   . Arthritis   . Asthma   . Duodenal adenoma   . GERD (gastroesophageal reflux disease)   . Hypertension   . Pneumonia    hx  . Post-traumatic osteoarthritis of left ankle   . Pre-diabetes   . Schizophrenia (HCC)   . Stroke [facility])    2009-or10    Family History  Adopted: Yes  Problem Relation Age of Onset  . Colon cancer Neg Hx   . Esophageal cancer Neg Hx   . Rectal cancer Neg Hx   . Stomach cancer Neg Hx     Past Surgical History:  Procedure Laterality Date  . ANKLE ARTHROSCOPY Left 10/20/2017   Procedure: ANKLE ARTHROSCOPY;  Surgeon: Nadara Mustard, MD;  Location: [facility] OR;  Service: Orthopedics;  Laterality: Left;  . ANKLE FUSION Left  10/20/2017  . ANKLE FUSION Left 10/20/2017   Procedure: LEFT ANKLE FUSION;  Surgeon: Nadara Mustard, MD;  Location: [facility] OR;  Service: Orthopedics;  Laterality: Left;  . ESOPHAGOGASTRODUODENOSCOPY Left 12/05/2013   Procedure: ESOPHAGOGASTRODUODENOSCOPY (EGD);  Surgeon: Willis Modena, MD;  Location: [facility]COPY;  Service: Endoscopy;  Laterality: Left;  . ESOPHAGOGASTRODUODENOSCOPY (EGD) WITH PROPOFOL N/A 02/20/2015   Procedure: ESOPHAGOGASTRODUODENOSCOPY (EGD) WITH PROPOFOL;  Surgeon: Charlott Rakes, MD;  Location: [facility] ENDOSCOPY;  Service: Endoscopy;  Laterality: N/A;  . HARDWARE REMOVAL Left 01/29/2018   Procedure: HARDWARE REMOVAL LEFT ANKLE;  Surgeon: Nadara Mustard, MD;  Location: [facility] OR;  Service: Orthopedics;  Laterality: Left;  . TOTAL KNEE ARTHROPLASTY Right 07/21/2016   Procedure: TOTAL KNEE ARTHROPLASTY;  Surgeon: Cammy Copa, MD;  Location: [facility] OR;  Service: Orthopedics;  Laterality: Right;  . UPPER  GASTROINTESTINAL ENDOSCOPY     Social History   Occupational History  . Not on file  Tobacco Use  . Smoking status: Heavy Tobacco Smoker    Packs/day: 1.50    Years: 38.00    Pack years: 57.00    Types: Cigarettes  . Smokeless tobacco: Never Used  . Tobacco comment: onset age 21; upto 1.5 ppd  Substance and Sexual Activity  . Alcohol use: Not Currently    Alcohol/week: 3.0 standard drinks    Types: 3 Cans of beer per week    Comment: quit 2015; formerly up to 2 fifths/day  . Drug use: No  . Sexual activity: Yes    Comment: declined condoms

## 2018-10-17 ENCOUNTER — Ambulatory Visit: Payer: Medicare Other | Admitting: Orthopedic Surgery

## 2018-10-18 ENCOUNTER — Encounter: Payer: Medicare Other | Admitting: Physical Medicine and Rehabilitation

## 2018-10-20 ENCOUNTER — Other Ambulatory Visit: Payer: Self-pay | Admitting: Specialist

## 2018-10-20 MED ORDER — OXYCODONE-ACETAMINOPHEN 7.5-325 MG PO TABS: 1.0000 | ORAL_TABLET | Freq: Two times a day (BID) | ORAL | 0 refills | Status: DC | PRN

## 2018-10-20 NOTE — Telephone Encounter (Signed)
Sent request to Dr. Nitka 

## 2018-10-20 NOTE — Telephone Encounter (Signed)
Rx refill Oxycodone °

## 2018-10-23 ENCOUNTER — Other Ambulatory Visit: Payer: Medicare Other

## 2018-10-24 ENCOUNTER — Encounter: Payer: Self-pay | Admitting: Family Medicine

## 2018-10-28 ENCOUNTER — Telehealth: Payer: Self-pay | Admitting: Specialist

## 2018-10-28 NOTE — Telephone Encounter (Signed)
Please advise 

## 2018-10-28 NOTE — Telephone Encounter (Signed)
Patient called about appts scheduled to see Louanne Skye and Jeneen Rinks. Per [place] patient is scheduled for surgery and will need to keep pre-op w/James and cancel appt w/Nitka. Advised Sherrie MRI scheduled for 8/19. She will make note for Nitka to call patient with results. Advised patient of appt changes and he understood.

## 2018-10-28 NOTE — Telephone Encounter (Signed)
Patient called needing Rx refilled (Oxycodone) The number to contact patient is [phone]

## 2018-10-31 ENCOUNTER — Other Ambulatory Visit: Payer: Self-pay | Admitting: Specialist

## 2018-10-31 NOTE — Telephone Encounter (Signed)
Pt called in requesting a refill on his medication oxycodone.  [phone]

## 2018-10-31 NOTE — Telephone Encounter (Signed)
Request has been sent to Dr. Nitka 

## 2018-11-02 ENCOUNTER — Other Ambulatory Visit: Payer: Self-pay | Admitting: Specialist

## 2018-11-02 MED ORDER — OXYCODONE-ACETAMINOPHEN 7.5-325 MG PO TABS: 1.0000 | ORAL_TABLET | Freq: Two times a day (BID) | ORAL | 0 refills | Status: DC | PRN

## 2018-11-02 NOTE — Telephone Encounter (Signed)
Rx for oxycodone sent to his pharmacy. We are scheduling for surgery.

## 2018-11-10 ENCOUNTER — Ambulatory Visit: Payer: Medicare Other | Admitting: Specialist

## 2018-11-16 ENCOUNTER — Other Ambulatory Visit: Payer: Medicare Other

## 2018-11-16 ENCOUNTER — Other Ambulatory Visit: Payer: Self-pay

## 2018-11-17 ENCOUNTER — Telehealth: Payer: Self-pay | Admitting: Specialist

## 2018-11-17 ENCOUNTER — Ambulatory Visit: Payer: Medicare Other | Admitting: Surgery

## 2018-11-17 NOTE — Telephone Encounter (Signed)
Please see message below  Thank you

## 2018-11-17 NOTE — Telephone Encounter (Signed)
I cancelled surgery.  I called and spoke with girlfriend.  Patient already has appointment with Dr. Louanne Skye on 12/09/18.  He will come back in before rescheduling surgery.

## 2018-11-17 NOTE — Telephone Encounter (Signed)
Patient's girlfriend left a message stating that the patient wanted to hold off on the surgery for a month or so.  CB#[phone].  Thank you.

## 2018-11-18 ENCOUNTER — Other Ambulatory Visit: Payer: Self-pay

## 2018-11-18 ENCOUNTER — Other Ambulatory Visit: Payer: Self-pay | Admitting: Specialist

## 2018-11-18 MED ORDER — OXYCODONE-ACETAMINOPHEN 7.5-325 MG PO TABS: 1.0000 | ORAL_TABLET | Freq: Two times a day (BID) | ORAL | 0 refills | Status: DC | PRN

## 2018-11-18 NOTE — Telephone Encounter (Signed)
Patient would like a Rx refill on Oxycodone sent to [facility] on [address].  CB# is [phone].  Please advise.  Thank you.

## 2018-11-18 NOTE — Telephone Encounter (Signed)
Apparently this Rx has already been addressed according to order que. Rx was filled on 8/21.

## 2018-11-18 NOTE — Addendum Note (Signed)
Addended by: Erich Montane on: 11/18/2018 03:59 PM   Modules accepted: Orders

## 2018-11-21 ENCOUNTER — Ambulatory Visit: Payer: Medicare Other | Admitting: Specialist

## 2018-11-21 ENCOUNTER — Encounter (HOSPITAL_COMMUNITY): Payer: Self-pay

## 2018-11-21 ENCOUNTER — Other Ambulatory Visit: Payer: Self-pay

## 2018-11-21 ENCOUNTER — Emergency Department (HOSPITAL_COMMUNITY)
Admission: EM | Admit: 2018-11-21 | Discharge: 2018-11-21 | Disposition: A | Discharge status: Home / Self Care | Payer: Medicare Other | Attending: Emergency Medicine | Admitting: Emergency Medicine

## 2018-11-21 DIAGNOSIS — I10 Essential (primary) hypertension: Secondary | ICD-10-CM | POA: Insufficient documentation

## 2018-11-21 DIAGNOSIS — J45909 Unspecified asthma, uncomplicated: Secondary | ICD-10-CM | POA: Diagnosis not present

## 2018-11-21 DIAGNOSIS — F17210 Nicotine dependence, cigarettes, uncomplicated: Secondary | ICD-10-CM | POA: Diagnosis not present

## 2018-11-21 DIAGNOSIS — Y906 Blood alcohol level of 120-199 mg/100 ml: Secondary | ICD-10-CM | POA: Insufficient documentation

## 2018-11-21 DIAGNOSIS — Z79899 Other long term (current) drug therapy: Secondary | ICD-10-CM | POA: Diagnosis not present

## 2018-11-21 DIAGNOSIS — E119 Type 2 diabetes mellitus without complications: Secondary | ICD-10-CM | POA: Insufficient documentation

## 2018-11-21 DIAGNOSIS — R45850 Homicidal ideations: Secondary | ICD-10-CM | POA: Insufficient documentation

## 2018-11-21 DIAGNOSIS — F10929 Alcohol use, unspecified with intoxication, unspecified: Secondary | ICD-10-CM | POA: Diagnosis not present

## 2018-11-21 DIAGNOSIS — Z20828 Contact with and (suspected) exposure to other viral communicable diseases: Secondary | ICD-10-CM | POA: Diagnosis not present

## 2018-11-21 DIAGNOSIS — R4689 Other symptoms and signs involving appearance and behavior: Secondary | ICD-10-CM | POA: Diagnosis present

## 2018-11-21 DIAGNOSIS — F209 Schizophrenia, unspecified: Secondary | ICD-10-CM | POA: Diagnosis not present

## 2018-11-21 DIAGNOSIS — F10920 Alcohol use, unspecified with intoxication, uncomplicated: Secondary | ICD-10-CM

## 2018-11-21 LAB — URINALYSIS, ROUTINE W REFLEX MICROSCOPIC
Bilirubin Urine: NEGATIVE
Glucose, UA: NEGATIVE mg/dL
Hgb urine dipstick: NEGATIVE
Ketones, ur: NEGATIVE mg/dL
Leukocytes,Ua: NEGATIVE
Nitrite: NEGATIVE
Protein, ur: NEGATIVE mg/dL
Specific Gravity, Urine: 1.001 — ABNORMAL LOW (ref 1.005–1.030)
pH: 6 (ref 5.0–8.0)

## 2018-11-21 LAB — BASIC METABOLIC PANEL
Anion gap: 14 (ref 5–15)
BUN: 6 mg/dL (ref 6–20)
CO2: 19 mmol/L — ABNORMAL LOW (ref 22–32)
Calcium: 8.9 mg/dL (ref 8.9–10.3)
Chloride: 102 mmol/L (ref 98–111)
Creatinine, Ser: 0.68 mg/dL (ref 0.61–1.24)
GFR calc Af Amer: >60 mL/min (ref >60)
GFR calc non Af Amer: >60 mL/min (ref >60)
Glucose, Bld: 107 mg/dL — ABNORMAL HIGH (ref 70–99)
Potassium: 3.3 mmol/L — ABNORMAL LOW (ref 3.5–5.1)
Sodium: 135 mmol/L (ref 135–145)

## 2018-11-21 LAB — CBC WITH DIFFERENTIAL/PLATELET
Abs Immature Granulocytes: 0.02 10*3/uL (ref 0.00–0.07)
Basophils Absolute: 0 10*3/uL (ref 0.0–0.1)
Basophils Relative: 0 %
Eosinophils Absolute: 0.2 10*3/uL (ref 0.0–0.5)
Eosinophils Relative: 2 %
HCT: 39 % (ref 39.0–52.0)
Hemoglobin: 12.3 g/dL — ABNORMAL LOW (ref 13.0–17.0)
Immature Granulocytes: 0 %
Lymphocytes Relative: 24 %
Lymphs Abs: 2.3 10*3/uL (ref 0.7–4.0)
MCH: 27 pg (ref 26.0–34.0)
MCHC: 31.5 g/dL (ref 30.0–36.0)
MCV: 85.7 fL (ref 80.0–100.0)
Monocytes Absolute: 0.6 10*3/uL (ref 0.1–1.0)
Monocytes Relative: 6 %
Neutro Abs: 6.7 10*3/uL (ref 1.7–7.7)
Neutrophils Relative %: 68 %
Platelets: 347 10*3/uL (ref 150–400)
RBC: 4.55 MIL/uL (ref 4.22–5.81)
RDW: 15.6 % — ABNORMAL HIGH (ref 11.5–15.5)
WBC: 9.7 10*3/uL (ref 4.0–10.5)
nRBC: 0 % (ref 0.0–0.2)

## 2018-11-21 LAB — SALICYLATE LEVEL: Salicylate Lvl: <7 mg/dL (ref 2.8–30.0)

## 2018-11-21 LAB — ETHANOL: Alcohol, Ethyl (B): 148 mg/dL — ABNORMAL HIGH (ref <10)

## 2018-11-21 LAB — RAPID URINE DRUG SCREEN, HOSP PERFORMED
Amphetamines: NOT DETECTED
Barbiturates: NOT DETECTED
Benzodiazepines: POSITIVE — AB
Cocaine: NOT DETECTED
Opiates: NOT DETECTED
Tetrahydrocannabinol: NOT DETECTED

## 2018-11-21 LAB — ACETAMINOPHEN LEVEL: Acetaminophen (Tylenol), Serum: <10 ug/mL — ABNORMAL LOW (ref 10–30)

## 2018-11-21 LAB — SARS CORONAVIRUS 2 (TAT 6-24 HRS): SARS Coronavirus 2: NEGATIVE

## 2018-11-21 MED ORDER — LORAZEPAM 2 MG/ML IJ SOLN
2.0000 mg | Freq: Once | INTRAMUSCULAR | Status: AC
  Administered 2018-11-21: 13:00:00 2 mg via INTRAMUSCULAR
  Filled 2018-11-21: qty 1

## 2018-11-21 MED ORDER — LORAZEPAM 2 MG/ML IJ SOLN
1.0000 mg | Freq: Once | INTRAMUSCULAR | Status: AC
  Administered 2018-11-21: 10:00:00 1 mg via INTRAVENOUS
  Filled 2018-11-21: qty 1

## 2018-11-21 NOTE — ED Notes (Addendum)
Adjusted pt wrist restraints for comfort. Pt is still restless and agitated shaking the bed, attempting to pull out of restraints, and yelling out. Explained to pt again the criteria for having restraints removed.

## 2018-11-21 NOTE — ED Notes (Signed)
Enter room to check on PT. PT gave this writer middle finger while attempting to sit up
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
## 2018-11-21 NOTE — ED Notes (Signed)
Pt got out of bed and was standing in the doorway of room. When asked what he was doing, pt states calmly, "I'm gonna go home". Redirected pt back to bed and informed him that he is IVC and cannot go home at this time. Pt appears to understand- and got back in bed.

## 2018-11-21 NOTE — BHH Counselor (Signed)
  [place] ASSESSMENT DISPOSITION:   [facility] discussed case with [place] Provider, Leilani Merl, DO who cleared pt psychiatrically and recommends pt to peer support and follow up with ACTT team.  Helene Bernstein L. [place], [place], [facility], [facility] Therapeutic Triage Specialist  [phone]

## 2018-11-21 NOTE — ED Notes (Signed)
Pt is restless, pulled off all leads, pulse ox. Pt remains in restraints, seizure pads placed to keep pt from sticking arms through railing
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
## 2018-11-21 NOTE — Discharge Instructions (Signed)
Please return for any problem.  Follow-up with your regular care provider as instructed. °

## 2018-11-21 NOTE — ED Provider Notes (Signed)
6:50 PM -I was asked by nursing to evaluate patient for possible discharge, by reversing his commitment papers.  Clinic earlier by Dr. Francia Greaves, because of aggressive behavior, and not wanting to be evaluated.  Patient was brought in by EMS with law enforcement, because of aggressive behavior and threats to kill someone.  He had also been drinking.  At this time the patient is alert, cooperative, eating and drinking without problems and states he is ready to go home.  He tells me that he is not actively homicidal, suicidal, and is working on controlling his drinking.  He lives with his girlfriend and states that they get along.  He has no other acute issues that he requires treatment for and would like to go home.  He states a friend will come and get him.  IVC paperwork, rescinded by me.   Daleen Bo, MD 11/21/18 1901

## 2018-11-21 NOTE — BHH Counselor (Signed)
Per Leafy Ro, RN patient is medicated (5 mg IM Haldol and 5 mg IM Versed in EMS). Patient in unable to participate in assessment at this time. TTS will assessment when alert.

## 2018-11-21 NOTE — ED Triage Notes (Signed)
Pt arrives [facility] for homicidal thoughts/threats and combative behavior. Pt has a hx of schizophrenia, family states pt gets a shot every 3 months, but hasn't been as effective lately. Pt was combative with EMS and arrived in 4 point restraints, has already had 5mg  IM Holdol, and 5mg  IM Versed. Pt placed back in restraints after moving over to stretcher.

## 2018-11-21 NOTE — ED Notes (Signed)
Pt provided with dinner tray.

## 2018-11-22 ENCOUNTER — Inpatient Hospital Stay (HOSPITAL_COMMUNITY): Admission: RE | Admit: 2018-11-22 | Payer: Medicare Other | Source: Home / Self Care | Admitting: Specialist

## 2018-11-22 ENCOUNTER — Encounter (HOSPITAL_COMMUNITY): Admission: RE | Payer: Self-pay | Source: Home / Self Care

## 2018-11-22 SURGERY — POSTERIOR LUMBAR FUSION 1 LEVEL
Anesthesia: General

## 2018-12-01 ENCOUNTER — Other Ambulatory Visit: Payer: Self-pay

## 2018-12-01 MED ORDER — IBUPROFEN 600 MG PO TABS: 600.0000 mg | ORAL_TABLET | Freq: Three times a day (TID) | ORAL | 0 refills | Status: DC | PRN

## 2018-12-08 ENCOUNTER — Other Ambulatory Visit: Payer: Self-pay | Admitting: Specialist

## 2018-12-08 MED ORDER — OXYCODONE-ACETAMINOPHEN 7.5-325 MG PO TABS: 1.0000 | ORAL_TABLET | Freq: Two times a day (BID) | ORAL | 0 refills | Status: DC | PRN

## 2018-12-08 NOTE — Telephone Encounter (Signed)
Patient called. Would like oxycodone called in for pain. Call back number is [phone].

## 2018-12-08 NOTE — Telephone Encounter (Signed)
Sent request to Dr. Nitka 

## 2018-12-09 ENCOUNTER — Ambulatory Visit: Payer: Medicare Other | Admitting: Specialist

## 2018-12-19 ENCOUNTER — Ambulatory Visit
Admission: RE | Admit: 2018-12-19 | Discharge: 2018-12-19 | Disposition: A | Discharge status: Home / Self Care | Payer: Medicare Other | Source: Ambulatory Visit | Attending: Specialist | Admitting: Specialist

## 2018-12-19 ENCOUNTER — Other Ambulatory Visit: Payer: Self-pay

## 2018-12-19 DIAGNOSIS — M25552 Pain in left hip: Secondary | ICD-10-CM

## 2018-12-23 ENCOUNTER — Encounter: Payer: Self-pay | Admitting: Specialist

## 2018-12-23 ENCOUNTER — Ambulatory Visit (INDEPENDENT_AMBULATORY_CARE_PROVIDER_SITE_OTHER): Payer: Medicare Other | Admitting: Specialist

## 2018-12-23 ENCOUNTER — Other Ambulatory Visit: Payer: Self-pay

## 2018-12-23 VITALS — BP 133/84 | HR 108 | Ht 69.0 in | Wt 230.0 lb

## 2018-12-23 DIAGNOSIS — M4316 Spondylolisthesis, lumbar region: Secondary | ICD-10-CM

## 2018-12-23 DIAGNOSIS — S76012S Strain of muscle, fascia and tendon of left hip, sequela: Secondary | ICD-10-CM | POA: Diagnosis not present

## 2018-12-23 DIAGNOSIS — M5417 Radiculopathy, lumbosacral region: Secondary | ICD-10-CM

## 2018-12-23 DIAGNOSIS — M4807 Spinal stenosis, lumbosacral region: Secondary | ICD-10-CM

## 2018-12-23 MED ORDER — OXYCODONE-ACETAMINOPHEN 7.5-325 MG PO TABS: 1.0000 | ORAL_TABLET | Freq: Two times a day (BID) | ORAL | 0 refills | Status: DC | PRN

## 2018-12-23 NOTE — Progress Notes (Signed)
Office Visit Note   Patient: [name]           Date of Birth: 1965/07/13           MRN: [id number] Visit Date: 12/23/2018              Requested by: [name], MD [phone] [place],  [place] [address] PCP: [name], MD   Assessment & Plan: Visit Diagnoses:  1. Hip strain, left, sequela   2. Lumbosacral radiculopathy   3. Spinal stenosis of lumbosacral region   4. Spondylolisthesis, lumbar region     Plan: Plan:Avoid bending, stooping and avoid lifting weights greater than 10 lbs. Avoid prolong standing and walking. Order for a new walker with wheels. Surgery scheduling secretary Tivis Ringer, will call you in the next week to schedule for surgery.  Surgery recommended is a two level lumbar fusion L4-5 and L3-4 this would be done with rods, screws and cages with local bone graft and allograft (donor bone graft). Take hydrocodone for for pain. Risk of surgery includes risk of infection 1 in 200 patients, bleeding 1/2% chance you would need a transfusion.   Risk to the nerves is one in 10,000. You will need to use a brace for 3 months and wean from the brace on the 4th month. Expect improved walking and standing tolerance. Expect relief of leg pain but numbness may persist depending on the length and degree of pressure that has been present. MRI if the left hip to assess for nondisplaced left hip injury prior to surgery on the lumbar spine for severe spinal stensosis associated with spondylolisthesis L4-5 and left L3-4 microdiscectomy. I will call with the results of the MRI of the left hip.   Follow-Up Instructions: No follow-ups on file.   Orders:  No orders of the defined types were placed in this encounter.  No orders of the defined types were placed in this encounter.     Procedures: No procedures performed   Clinical Data: Findings:  CLINICAL DATA:  History of a fall with persistent left hip pain.  EXAM: MR OF THE LEFT HIP WITHOUT  CONTRAST  TECHNIQUE: Multiplanar, multisequence MR imaging was performed. No intravenous contrast was administered.  COMPARISON:  Radiographs 10/13/2018  FINDINGS: Both hips are normally located. Mild degenerative changes with early joint space narrowing and spurring. No stress fracture or AVN. No bone lesions. The pubic symphysis and SI joints are intact. No pelvic fractures or bone lesions.  No hip joint effusion or periarticular fluid collections to suggest a paralabral cyst. Labral degenerative changes without obvious tear.  There is marked edema like signal abnormality and focal fluid collection in the region of the distal quadratus femoris muscle. Findings most consistent with a muscle tear with liquified hematoma.  The other hip and pelvic muscles appear intact. Mild iliopsoas bursitis. There is also mild right-sided hamstring tendinopathy.  No significant intrapelvic abnormalities are identified.  IMPRESSION: 1. Mild/early hip joint degenerative changes but no fracture or AVN. 2. Intact bony pelvis. 3. Torn quadratus femoris muscle with probable intrasubstance liquified hematoma. 4. Mild right hamstring tendinopathy and mild left-sided iliopsoas bursitis.   Electronically Signed   By: Rudie Meyer M.D.   On: 12/19/2018 15:22    Subjective: Chief Complaint  Patient presents with  . Left Hip - Follow-up    MRI Review  . Lower Back - Follow-up    HPI  Review of Systems  Constitutional: Negative.   HENT: Negative.  Eyes: Negative.   Respiratory: Negative.   Cardiovascular: Negative.   Gastrointestinal: Negative.   Endocrine: Negative.   Genitourinary: Negative.   Musculoskeletal: Negative.   Skin: Negative.   Allergic/Immunologic: Negative.   Neurological: Negative.   Hematological: Negative.   Psychiatric/Behavioral: Negative.      Objective: Vital Signs: BP 133/84 (BP Location: Left Arm, Patient Position: Sitting)   Pulse (!)  108   Ht 5\' 9"  (1.753 m)   Wt 230 lb (104.3 kg)   BMI 33.97 kg/m   Physical Exam Constitutional:      Appearance: He is well-developed.  HENT:     Head: Normocephalic and atraumatic.  Eyes:     Pupils: Pupils are equal, round, and reactive to light.  Neck:     Musculoskeletal: Normal range of motion and neck supple.  Pulmonary:     Effort: Pulmonary effort is normal.     Breath sounds: Normal breath sounds.  Abdominal:     General: Bowel sounds are normal.     Palpations: Abdomen is soft.  Musculoskeletal: Normal range of motion.  Skin:    General: Skin is warm and dry.  Neurological:     Mental Status: He is alert and oriented to person, place, and time.  Psychiatric:        Behavior: Behavior normal.        Thought Content: Thought content normal.        Judgment: Judgment normal.     Back Exam   Tenderness  The patient is experiencing tenderness in the lumbar.  Muscle Strength  Left Quadriceps:  4/5   Reflexes  Patellar: 2/4 Achilles: 2/4      Specialty Comments:  No specialty comments available.  Imaging: No results found.   PMFS History: Patient Active Problem List   Diagnosis Date Noted  . Hardware complicating wound infection (HCC) 01/31/2018  . Spondylolisthesis, lumbar region 12/30/2017  . S/P ankle fusion 10/20/2017  . Post-traumatic osteoarthritis, left ankle and foot 01/07/2017  . Primary osteoarthritis of right knee 05/27/2016  . GERD (gastroesophageal reflux disease) 07/09/2015  . Mild persistent asthma 07/09/2015  . Type 2 diabetes mellitus (HCC) 07/09/2015  . Polyp of duodenum 06/04/2015  . Alcohol dependence (HCC) 02/25/2015  . Moderate episode of recurrent major depressive disorder (HCC) 02/25/2015  . Upper GI bleed 02/18/2015  . Benign essential HTN 02/18/2015  . Anemia of chronic disease 02/18/2015  . Pulmonary nodule 02/18/2015  . Cocaine abuse (HCC) 03/02/2014  . Posttraumatic stress disorder 03/02/2014  . History of  CVA (cerebrovascular accident) 01/12/2014  . Schizophrenia (HCC) 01/12/2014  . Tobacco dependence syndrome 12/06/2013   Past Medical History:  Diagnosis Date  . Anxiety   . Arthritis   . Asthma   . Duodenal adenoma   . GERD (gastroesophageal reflux disease)   . Hypertension   . Pneumonia    hx  . Post-traumatic osteoarthritis of left ankle   . Pre-diabetes   . Presence of right artificial knee joint 12/17/2016  . Schizophrenia (HCC)   . Stroke [facility])    2009-or10    Family History  Adopted: Yes  Problem Relation Age of Onset  . Colon cancer Neg Hx   . Esophageal cancer Neg Hx   . Rectal cancer Neg Hx   . Stomach cancer Neg Hx     Past Surgical History:  Procedure Laterality Date  . ANKLE ARTHROSCOPY Left 10/20/2017   Procedure: ANKLE ARTHROSCOPY;  Surgeon: Nadara Mustard, MD;  Location: [facility] OR;  Service: Orthopedics;  Laterality: Left;  . ANKLE FUSION Left 10/20/2017  . ANKLE FUSION Left 10/20/2017   Procedure: LEFT ANKLE FUSION;  Surgeon: Nadara Mustard, MD;  Location: [facility] OR;  Service: Orthopedics;  Laterality: Left;  . ESOPHAGOGASTRODUODENOSCOPY Left 12/05/2013   Procedure: ESOPHAGOGASTRODUODENOSCOPY (EGD);  Surgeon: Willis Modena, MD;  Location: [facility];  Service: Endoscopy;  Laterality: Left;  . ESOPHAGOGASTRODUODENOSCOPY (EGD) WITH PROPOFOL N/A 02/20/2015   Procedure: ESOPHAGOGASTRODUODENOSCOPY (EGD) WITH PROPOFOL;  Surgeon: Charlott Rakes, MD;  Location: [facility] ENDOSCOPY;  Service: Endoscopy;  Laterality: N/A;  . HARDWARE REMOVAL Left 01/29/2018   Procedure: HARDWARE REMOVAL LEFT ANKLE;  Surgeon: Nadara Mustard, MD;  Location: [facility] OR;  Service: Orthopedics;  Laterality: Left;  . TOTAL KNEE ARTHROPLASTY Right 07/21/2016   Procedure: TOTAL KNEE ARTHROPLASTY;  Surgeon: Cammy Copa, MD;  Location: [facility] OR;  Service: Orthopedics;  Laterality: Right;  . UPPER GASTROINTESTINAL ENDOSCOPY     Social History   Occupational History  . Not on file  Tobacco Use  . Smoking  status: Heavy Tobacco Smoker    Packs/day: 1.50    Years: 38.00    Pack years: 57.00    Types: Cigarettes  . Smokeless tobacco: Never Used  . Tobacco comment: onset age 83; upto 1.5 ppd  Substance and Sexual Activity  . Alcohol use: Not Currently    Alcohol/week: 3.0 standard drinks    Types: 3 Cans of beer per week    Comment: quit 2015; formerly up to 2 fifths/day  . Drug use: No  . Sexual activity: Yes    Comment: declined condoms

## 2018-12-23 NOTE — Patient Instructions (Signed)
Plan:Avoid bending, stooping and avoid lifting weights greater than 10 lbs. Avoid prolong standing and walking. Order for a new walker with wheels. Surgery scheduling secretary Kandice Hams, will call you in the next week to schedule for surgery.  Surgery recommended is a two level lumbar fusion L4-5 and L3-4 this would be done with rods, screws and cages with local bone graft and allograft (donor bone graft). Take hydrocodone for for pain. Risk of surgery includes risk of infection 1 in 200 patients, bleeding 1/2% chance you would need a transfusion.   Risk to the nerves is one in 10,000. You will need to use a brace for 3 months and wean from the brace on the 4th month. Expect improved walking and standing tolerance. Expect relief of leg pain but numbness may persist depending on the length and degree of pressure that has been present. MRI if the left hip to assess for nondisplaced left hip injury prior to surgery on the lumbar spine for severe spinal stensosis associated with spondylolisthesis L4-5 and left L3-4 microdiscectomy. I will call with the results of the MRI of the left hip.

## 2019-01-13 ENCOUNTER — Other Ambulatory Visit: Payer: Self-pay | Admitting: Specialist

## 2019-01-13 ENCOUNTER — Other Ambulatory Visit: Payer: Self-pay | Admitting: Family Medicine

## 2019-01-13 NOTE — Telephone Encounter (Signed)
Patient called. Would like a refill on Oxycodone.

## 2019-01-16 ENCOUNTER — Telehealth: Payer: Self-pay | Admitting: Specialist

## 2019-01-16 NOTE — Telephone Encounter (Signed)
Patient states that he understands.

## 2019-01-16 NOTE — Addendum Note (Signed)
Addended by: Minda Ditto, Alyse Low N on: 01/16/2019 10:29 AM   Modules accepted: Orders

## 2019-01-16 NOTE — Telephone Encounter (Signed)
I called and advised that his request has been sent to Dr. Louanne Skye and for the patient to check with the pharmacy later to see if it has been approved

## 2019-01-16 NOTE — Telephone Encounter (Signed)
Patient called left voicemail message needing Rx refilled (Oxycodone) The number to contact patient is [phone]

## 2019-01-17 ENCOUNTER — Telehealth: Payer: Self-pay | Admitting: Specialist

## 2019-01-17 MED ORDER — OXYCODONE-ACETAMINOPHEN 7.5-325 MG PO TABS: 1.0000 | ORAL_TABLET | Freq: Two times a day (BID) | ORAL | 0 refills | Status: DC | PRN

## 2019-01-17 NOTE — Telephone Encounter (Signed)
When is this patient's surgery scheduled? we have been prescribing oxycodone forever and need to get him off it.

## 2019-01-17 NOTE — Telephone Encounter (Signed)
Patient called. Would like to know if his oxycodone will be called in today. His call back number is [phone]

## 2019-01-18 NOTE — Telephone Encounter (Signed)
I will call him to discuss scheduling.  Was waiting for word to do so.  Thanks!

## 2019-01-18 NOTE — Telephone Encounter (Signed)
Was sent in on 01/17/19

## 2019-02-01 ENCOUNTER — Other Ambulatory Visit: Payer: Self-pay | Admitting: Internal Medicine

## 2019-02-06 ENCOUNTER — Telehealth: Payer: Self-pay | Admitting: Specialist

## 2019-02-06 ENCOUNTER — Other Ambulatory Visit: Payer: Self-pay | Admitting: Specialist

## 2019-02-06 MED ORDER — OXYCODONE-ACETAMINOPHEN 7.5-325 MG PO TABS: 1.0000 | ORAL_TABLET | Freq: Two times a day (BID) | ORAL | 0 refills | Status: DC | PRN

## 2019-02-06 NOTE — Telephone Encounter (Signed)
Received voicemail message from patient needing Rx refilled (Oxycodone) The number to contact patient is [phone]

## 2019-02-06 NOTE — Telephone Encounter (Signed)
Can you advise on when he will be scheduled?--I have sent a request to Dr. Louanne Skye earlier this morning for his pain meds

## 2019-02-06 NOTE — Telephone Encounter (Signed)
Noted. Thanks.

## 2019-02-06 NOTE — Telephone Encounter (Signed)
Patient is getting his surgery scheduled but is waiting for the schedule to call him back.  He is wanting to know if Dr. Louanne Skye would refill his Oxycodone one more time.  CB#[phone].  Thank you.

## 2019-02-06 NOTE — Telephone Encounter (Signed)
Patient is scheduled for 02-28-19 for surgery.

## 2019-02-13 ENCOUNTER — Other Ambulatory Visit: Payer: Self-pay | Admitting: Infectious Diseases

## 2019-02-13 DIAGNOSIS — T847XXD Infection and inflammatory reaction due to other internal orthopedic prosthetic devices, implants and grafts, subsequent encounter: Secondary | ICD-10-CM

## 2019-02-15 ENCOUNTER — Telehealth: Payer: Self-pay | Admitting: Specialist

## 2019-02-15 NOTE — Telephone Encounter (Signed)
Pt called in said christy was suppose to give him a call to resched his appt for tomorrow he needs to know asap cause his surgery is coming up with dr.nitka   [phone]

## 2019-02-15 NOTE — Telephone Encounter (Signed)
Please call patient in regards to his appointment before his surgery.  CB#[phone].  Thank you.

## 2019-02-16 ENCOUNTER — Ambulatory Visit: Payer: Medicare Other | Admitting: Specialist

## 2019-02-16 NOTE — Telephone Encounter (Signed)
Spoke with patient and rescheduled appt

## 2019-02-17 NOTE — Telephone Encounter (Signed)
I called and spoke with patient and he will see Jeneen Rinks on 02/22/2019 for his pre-op appt.

## 2019-02-20 ENCOUNTER — Other Ambulatory Visit: Payer: Self-pay

## 2019-02-21 ENCOUNTER — Telehealth: Payer: Self-pay | Admitting: Specialist

## 2019-02-21 NOTE — Telephone Encounter (Signed)
Please advise------Patient called asked if Dr. Louanne Skye will write a note to medicaid transportation stating that patient need [name] to go with him to the appointment because he will not understand what he will be told concerning surgery,rehab and when he returns home.

## 2019-02-21 NOTE — Telephone Encounter (Signed)
Patient called asked if Dr. Louanne Skye will write a note to medicaid transportation stating that patient need [name] to go with him to the appointment because he will not understand what he will be told concerning surgery,rehab and when he returns home. The fax# is [phone]    The number to contact patient is [phone]

## 2019-02-22 ENCOUNTER — Telehealth: Payer: Self-pay | Admitting: Specialist

## 2019-02-22 ENCOUNTER — Ambulatory Visit: Payer: Medicare Other | Admitting: Surgery

## 2019-02-22 NOTE — Telephone Encounter (Signed)
Patient called this morning stating that transportation would not bring him to his pre op appointment with Jeneen Rinks.  I advised him that transportation had called yesterday (11/24) to confirm his appointment.  He stated that he will need to r/s his surgery, but requested an RX refill on his Oxycodone.  CB#[phone].  Thank you.

## 2019-02-22 NOTE — Telephone Encounter (Signed)
I called and spoke with patient, I advised that we will not refill his pain meds till we have him scheduled for surgery.  He states that his transportation will not take him to both appts for Preop and covid test.  Please look at rescheduling him when its not near a holiday.

## 2019-02-22 NOTE — Telephone Encounter (Signed)
Patient called this morning wanting to r/s his surgery stating that transportation would not bring him because he did not give them at 3 day notice.  I advised patient that transportation call on 11/24 to confirm his appointment.  I cancelled the pre op appointment with Jeneen Rinks.  He also stated that transportation will not take him on Monday either due to the holiday.  CB#[phone].  Thank you.

## 2019-02-24 NOTE — Pre-Procedure Instructions (Signed)
[facility] [place], [address] [place] [address] Phone: [phone] Fax: [phone]  [place], [place] - [address] [place] [address] Phone: [phone] Fax: [phone]      Your procedure is scheduled on 02-28-19  Report to [facility] Main Entrance "A" at 0530 A.M., and check in at the Admitting office.  Call this number if you have problems the morning of surgery:  [phone]  Call [phone] if you have any questions prior to your surgery date Monday-Friday 8am-4pm    Remember:  Do not eat or drink after midnight the night before your surgery    Take these medicines the morning of surgery with A SIP OF WATER: albuterol (PROVENTIL HFA;VENTOLIN HFA)as needed amLODipine (NORVASC) benztropine (COGENTIN)  buPROPion (WELLBUTRIN XL)  cetirizine (ZYRTEC)  omeprazole (PRILOSEC) oxyCODONE-acetaminophen (PERCOCET) sucralfate (CARAFATE)  7 days prior to surgery STOP taking any Aspirin (unless otherwise instructed by your surgeon), Aleve, Naproxen, Ibuprofen, Motrin, Advil, Goody's, BC's, all herbal medications, fish oil, and all vitamins.    The Morning of Surgery  Do not wear jewelry, make-up or nail polish.  Do not wear lotions, powders, or perfumes/colognes, or deodorant    Men may shave face and neck.  Do not bring valuables to the hospital.  [facility] is not responsible for any belongings or valuables.  If you are a smoker, DO NOT Smoke 24 hours prior to surgery  If you wear a CPAP at night please bring your mask, tubing, and machine the morning of surgery   Remember that you must have someone to transport you home after your surgery, and remain with you for 24 hours if you are discharged the same day.   Please bring cases for contacts, glasses, hearing aids, dentures or bridgework because it cannot be worn into  surgery.    Leave your suitcase in the car.  After surgery it may be brought to your room.  For patients admitted to the hospital, discharge time will be determined by your treatment team.  Patients discharged the day of surgery will not be allowed to drive home.    Special instructions:   [facility]- Preparing For Surgery  Before surgery, you can play an important role. Because skin is not sterile, your skin needs to be as free of germs as possible. You can reduce the number of germs on your skin by washing with CHG (chlorahexidine gluconate) Soap before surgery.  CHG is an antiseptic cleaner which kills germs and bonds with the skin to continue killing germs even after washing.    Oral Hygiene is also important to reduce your risk of infection.  Remember - BRUSH YOUR TEETH THE MORNING OF SURGERY WITH YOUR REGULAR TOOTHPASTE  Please do not use if you have an allergy to CHG or antibacterial soaps. If your skin becomes reddened/irritated stop using the CHG.  Do not shave (including legs and underarms) for at least 48 hours prior to first CHG shower. It is OK to shave your face.  Please follow these instructions carefully.   1. Shower the NIGHT BEFORE SURGERY and the MORNING OF SURGERY with CHG Soap.   2. If you chose to wash your hair, wash your hair first as usual with your normal shampoo.  3. After you shampoo, rinse your hair and body thoroughly to remove the shampoo.  4. Use CHG as you  would any other liquid soap. You can apply CHG directly to the skin and wash gently with a scrungie or a clean washcloth.   5. Apply the CHG Soap to your body ONLY FROM THE NECK DOWN.  Do not use on open wounds or open sores. Avoid contact with your eyes, ears, mouth and genitals (private parts). Wash Face and genitals (private parts)  with your normal soap.   6. Wash thoroughly, paying special attention to the area where your surgery will be performed.  7. Thoroughly rinse your body with warm  water from the neck down.  8. DO NOT shower/wash with your normal soap after using and rinsing off the CHG Soap.  9. Pat yourself dry with a CLEAN TOWEL.  10. Wear CLEAN PAJAMAS to bed the night before surgery, wear comfortable clothes the morning of surgery  11. Place CLEAN SHEETS on your bed the night of your first shower and DO NOT SLEEP WITH PETS.    Day of Surgery:  Please shower the morning of surgery with the CHG soap Do not apply any deodorants/lotions. Please wear clean clothes to the hospital/surgery center.   Remember to brush your teeth WITH YOUR REGULAR TOOTHPASTE.   Please read over the fact sheets that you were given.

## 2019-02-27 ENCOUNTER — Inpatient Hospital Stay (HOSPITAL_COMMUNITY)
Admission: RE | Admit: 2019-02-27 | Discharge: 2019-02-27 | Disposition: A | Discharge status: Home / Self Care | Payer: Medicare Other | Source: Ambulatory Visit

## 2019-02-27 ENCOUNTER — Other Ambulatory Visit (HOSPITAL_COMMUNITY): Payer: Medicare Other

## 2019-02-27 NOTE — Telephone Encounter (Signed)
I spoke with patient and rescheduled surgery to 03/14/19.  I also call Gayle at [facility] pre-op to have her go ahead and call him to schedule pre-op appt and COVID test.  This should give him plenty of time to arrange transportation.

## 2019-02-27 NOTE — Telephone Encounter (Signed)
I spoke with patient and rescheduled surgery to 03/14/19.

## 2019-02-28 ENCOUNTER — Other Ambulatory Visit: Payer: Self-pay | Admitting: Specialist

## 2019-02-28 ENCOUNTER — Telehealth: Payer: Self-pay

## 2019-02-28 SURGERY — Surgical Case
Anesthesia: *Unknown

## 2019-02-28 NOTE — Telephone Encounter (Signed)
Request sent to Dr. Estevan Oaks

## 2019-02-28 NOTE — Telephone Encounter (Signed)
Pt called in requesting a refill on his Oxycodone, pt states dr.nitka wouldn't refill it until he had a surgery date so pt said he  Now has a surgery date for 03-14-2019 and is requesting to refill his medication to last until the surgery.    [phone]

## 2019-02-28 NOTE — Telephone Encounter (Signed)
Patient called asking for refill on pain medication. He is requesting refill on percocet but he is asking for it to be refilled without the Tylenol . Please call patient to advise [place]

## 2019-02-28 NOTE — Telephone Encounter (Signed)
Request sent to Dr. Nitka  

## 2019-03-01 ENCOUNTER — Other Ambulatory Visit: Payer: Self-pay

## 2019-03-01 MED ORDER — OXYCODONE-ACETAMINOPHEN 7.5-325 MG PO TABS: 1.0000 | ORAL_TABLET | Freq: Two times a day (BID) | ORAL | 0 refills | Status: DC | PRN

## 2019-03-02 ENCOUNTER — Encounter: Payer: Self-pay | Admitting: Specialist

## 2019-03-02 NOTE — Telephone Encounter (Signed)
Letter typed and copy signed and placed on your desk. jen

## 2019-03-03 NOTE — Telephone Encounter (Signed)
I have faxed the note to the number given

## 2019-03-07 NOTE — Pre-Procedure Instructions (Signed)
[facility] DRUG STORE [place], [place] AT [place] [address] Phone: [phone] Fax: [phone]  [place], [place] - [address] [place] [address] Phone: [phone] Fax: [phone]      Your procedure is scheduled on Tuesday, December 15th, from 07:30 AM to 12:52 PM.  Report to [facility] Main Entrance "A" at 05:30 A.M., and check in at the Admitting office.  Call this number if you have problems the morning of surgery:  [phone]    Remember:  Do not eat after midnight the night before your surgery.  You may drink clear liquids until 04:30 AM the morning of your surgery.   Clear liquids allowed are: Water, Non-Citrus Juices (without pulp), Carbonated Beverages, Clear Tea, Black Coffee Only, and Gatorade.    Take these medicines the morning of surgery with A SIP OF WATER: amLODipine (NORVASC) buPROPion (WELLBUTRIN XL) cetirizine (ZYRTEC) omeprazole (PRILOSEC) sucralfate (CARAFATE)  IF NEEDED: albuterol (PROVENTIL HFA;VENTOLIN HFA) inhaler. Please bring all inhalers with you the day of surgery.  oxyCODONE-acetaminophen (PERCOCET) sodium chloride (OCEAN) nasal spray  As of today, STOP taking any Aspirin (unless otherwise instructed by your surgeon), Aleve, Naproxen, Ibuprofen, Motrin, Advil, Goody's, BC's, all herbal medications, fish oil, and all vitamins.    The Morning of Surgery  Do not wear jewelry.  Do not wear lotions, powders, colognes, or deodorant  Men may shave face and neck.  Do not bring valuables to the hospital.  [facility] is not responsible for any belongings or valuables.  If you are a smoker, DO NOT Smoke 24 hours prior to surgery  If you wear a CPAP at night please bring your mask, tubing, and machine the morning of surgery   Remember that you must have someone to transport you home after your surgery, and  remain with you for 24 hours if you are discharged the same day.   Please bring cases for contacts, glasses, hearing aids, dentures or bridgework because it cannot be worn into surgery.    Leave your suitcase in the car.  After surgery it may be brought to your room.  For patients admitted to the hospital, discharge time will be determined by your treatment team.  Patients discharged the day of surgery will not be allowed to drive home.    Special instructions:   [place]- Preparing For Surgery  Before surgery, you can play an important role. Because skin is not sterile, your skin needs to be as free of germs as possible. You can reduce the number of germs on your skin by washing with CHG (chlorahexidine gluconate) Soap before surgery.  CHG is an antiseptic cleaner which kills germs and bonds with the skin to continue killing germs even after washing.    Oral Hygiene is also important to reduce your risk of infection.  Remember - BRUSH YOUR TEETH THE MORNING OF SURGERY WITH YOUR REGULAR TOOTHPASTE  Please do not use if you have an allergy to CHG or antibacterial soaps. If your skin becomes reddened/irritated stop using the CHG.  Do not shave (including legs and underarms) for at least 48 hours prior to first CHG shower. It is OK to shave your face.  Please follow these instructions carefully.   1. Shower the NIGHT BEFORE SURGERY and the MORNING OF SURGERY with CHG Soap.   2. If you chose to wash your hair, wash your  hair first as usual with your normal shampoo.  3. After you shampoo, rinse your hair and body thoroughly to remove the shampoo.  4. Use CHG as you would any other liquid soap. You can apply CHG directly to the skin and wash gently with a scrungie or a clean washcloth.   5. Apply the CHG Soap to your body ONLY FROM THE NECK DOWN.  Do not use on open wounds or open sores. Avoid contact with your eyes, ears, mouth and genitals (private parts). Wash Face and genitals  (private parts)  with your normal soap.   6. Wash thoroughly, paying special attention to the area where your surgery will be performed.  7. Thoroughly rinse your body with warm water from the neck down.  8. DO NOT shower/wash with your normal soap after using and rinsing off the CHG Soap.  9. Pat yourself dry with a CLEAN TOWEL.  10. Wear CLEAN PAJAMAS to bed the night before surgery, wear comfortable clothes the morning of surgery  11. Place CLEAN SHEETS on your bed the night of your first shower and DO NOT SLEEP WITH PETS.    Day of Surgery:   Remember to brush your teeth WITH YOUR REGULAR TOOTHPASTE. Please shower the morning of surgery with the CHG soap Do not apply any deodorants/lotions. Please wear clean clothes to the hospital/surgery center.      Please read over the following fact sheets that you were given.

## 2019-03-08 ENCOUNTER — Inpatient Hospital Stay (HOSPITAL_COMMUNITY)
Admission: RE | Admit: 2019-03-08 | Discharge: 2019-03-08 | Disposition: A | Discharge status: Home / Self Care | Payer: Medicare Other | Source: Ambulatory Visit

## 2019-03-08 NOTE — Progress Notes (Signed)
Patient did not show up for his [place] appointment. Called patient to find out whether or not he was still coming. Patient stated that he missed his appointment and will have to wait to until he got someone to take him today, unsure whether he can come today. Patient requested his appointment be rescheduled for tomorrow, December 10th.  Notified the scheduler, Baker Janus-- only appointment available is 03/09/2019 @ 13:00. Patient called and notified about new available time. Patient states he "Will try to make it".

## 2019-03-09 ENCOUNTER — Ambulatory Visit: Payer: Medicare Other | Admitting: Surgery

## 2019-03-09 ENCOUNTER — Other Ambulatory Visit (HOSPITAL_COMMUNITY): Payer: Medicare Other

## 2019-03-10 ENCOUNTER — Inpatient Hospital Stay (HOSPITAL_COMMUNITY): Admission: RE | Admit: 2019-03-10 | Payer: Medicare Other | Source: Ambulatory Visit

## 2019-03-10 ENCOUNTER — Other Ambulatory Visit (HOSPITAL_COMMUNITY): Payer: Medicare Other

## 2019-03-14 ENCOUNTER — Telehealth: Payer: Self-pay | Admitting: Internal Medicine

## 2019-03-14 ENCOUNTER — Encounter: Payer: Self-pay | Admitting: Internal Medicine

## 2019-03-14 NOTE — Telephone Encounter (Signed)
Pt requested a refill for omeprazole.  °

## 2019-03-15 MED ORDER — OMEPRAZOLE 40 MG PO CPDR: DELAYED_RELEASE_CAPSULE | ORAL | 3 refills | Status: DC

## 2019-03-15 NOTE — Telephone Encounter (Signed)
Refilled Omeprazole 

## 2019-03-20 ENCOUNTER — Telehealth: Payer: Self-pay | Admitting: Specialist

## 2019-03-20 NOTE — Telephone Encounter (Signed)
Pt called in requesting a refill on Oxycodone. Please have that sent to [facility] on [address]   [phone]

## 2019-03-20 NOTE — Telephone Encounter (Signed)
Please advise 

## 2019-03-21 ENCOUNTER — Other Ambulatory Visit: Payer: Self-pay | Admitting: Specialist

## 2019-03-21 NOTE — Telephone Encounter (Signed)
Sent request to Dr. Nitka 

## 2019-03-21 NOTE — Telephone Encounter (Signed)
[name] called for a patient to see if refill has been sent to pharmacy. Advised sent to Dr. Louanne Skye and he is out today back tomorrow advised request was forwarded to [place].  Please call patient to advise [phone]

## 2019-03-22 ENCOUNTER — Ambulatory Visit: Payer: Medicare Other | Admitting: Surgery

## 2019-03-22 ENCOUNTER — Telehealth: Payer: Self-pay | Admitting: Specialist

## 2019-03-22 ENCOUNTER — Other Ambulatory Visit: Payer: Self-pay

## 2019-03-22 NOTE — Telephone Encounter (Signed)
Patient called. He wanted me to let it be  known that he has surgery scheduled for 04/11/2019. He is also requesting a refill on his OXYcodone.  Call back number: [phone]

## 2019-03-22 NOTE — Telephone Encounter (Signed)
Pt called in again checking on his refill request for Oxycodone. Please have that sent to [facility] on [address] Pt is requesting a call back to atleast let him know if it'll be refill or not.   [phone]

## 2019-03-22 NOTE — Telephone Encounter (Signed)
Can you please advise on this? I have a refill request to Dr. Louanne Skye.

## 2019-03-22 NOTE — Telephone Encounter (Signed)
I called and advised patient that his request has been sent to Dr. Louanne Skye and that he should keep a check with his pharmacy.

## 2019-03-22 NOTE — Telephone Encounter (Signed)
Patient is scheduled for surgery on 04-11-19.  He will come back to see Dr. Louanne Skye before then since it has been several months since he was seen.

## 2019-03-23 MED ORDER — OXYCODONE-ACETAMINOPHEN 7.5-325 MG PO TABS: 1.0000 | ORAL_TABLET | Freq: Two times a day (BID) | ORAL | 0 refills | Status: DC | PRN

## 2019-03-25 ENCOUNTER — Other Ambulatory Visit (HOSPITAL_COMMUNITY): Payer: Medicare Other

## 2019-03-27 ENCOUNTER — Ambulatory Visit: Payer: Medicare Other | Admitting: Family

## 2019-03-30 ENCOUNTER — Ambulatory Visit: Payer: Medicare Other | Admitting: Specialist

## 2019-04-04 ENCOUNTER — Ambulatory Visit: Payer: Medicare Other | Admitting: Family

## 2019-04-06 ENCOUNTER — Telehealth: Payer: Self-pay | Admitting: Specialist

## 2019-04-06 ENCOUNTER — Encounter: Payer: Self-pay | Admitting: Specialist

## 2019-04-06 ENCOUNTER — Other Ambulatory Visit: Payer: Self-pay

## 2019-04-06 ENCOUNTER — Other Ambulatory Visit: Payer: Self-pay | Admitting: Specialist

## 2019-04-06 ENCOUNTER — Ambulatory Visit (INDEPENDENT_AMBULATORY_CARE_PROVIDER_SITE_OTHER): Payer: Medicare Other | Admitting: Specialist

## 2019-04-06 VITALS — BP 117/80 | HR 131 | Ht 69.0 in | Wt 230.0 lb

## 2019-04-06 DIAGNOSIS — M48062 Spinal stenosis, lumbar region with neurogenic claudication: Secondary | ICD-10-CM

## 2019-04-06 DIAGNOSIS — M4316 Spondylolisthesis, lumbar region: Secondary | ICD-10-CM

## 2019-04-06 DIAGNOSIS — M5417 Radiculopathy, lumbosacral region: Secondary | ICD-10-CM

## 2019-04-06 NOTE — Progress Notes (Signed)
[facility] DRUG STORE [place], [place] AT [place] [address] Phone: [phone] Fax: [phone]  [place], [place] - [address] [place] [address] Phone: [phone] Fax: [phone]      Your procedure is scheduled on Tuesday, April 11, 2019.  Report to [facility] Main Entrance "A" at 5:30 A.M., and check in at the Admitting office.  Call this number if you have problems the morning of surgery:  [phone]    Remember:  Do not eat after midnight the night before your surgery  You may drink clear liquids until 4:30 AM the morning of your surgery.   Clear liquids allowed are: Water, Non-Citrus Juices (without pulp), Carbonated Beverages, Clear Tea, Black Coffee Only, and Gatorade  Please complete your PRE-SURGERY ENSURE that was provided to you by 4:30AM the morning of surgery.  Please, if able, drink it in one setting. DO NOT SIP.     Take these medicines the morning of surgery with A SIP OF WATER: albuterol (PROVENTIL HFA;VENTOLIN HFA) - if needed amLODipine (NORVASC) benztropine (COGENTIN) buPROPion (WELLBUTRIN XL) cetirizine (ZYRTEC) gabapentin (NEURONTIN) omeprazole (PRILOSEC) oxyCODONE-acetaminophen (PERCOCET) - if needed sodium chloride (OCEAN) - if needed sucralfate (CARAFATE)  Please bring all inhalers with you the day of surgery.    7 days prior to surgery STOP taking any Aspirin (unless otherwise instructed by your surgeon), Aleve, Naproxen, Ibuprofen, Motrin, Advil, Goody's, BC's, all herbal medications, fish oil, and all vitamins.    The Morning of Surgery  Do not wear jewelry.  Do not wear lotions, powders, colognes, or deodorant  Men may shave face and neck.  Do not bring valuables to the hospital.  [facility] is not responsible for any belongings or valuables.  If you are a smoker, DO NOT Smoke 24 hours  prior to surgery  If you wear a CPAP at night please bring your mask, tubing, and machine the morning of surgery   Remember that you must have someone to transport you home after your surgery, and remain with you for 24 hours if you are discharged the same day.   Please bring cases for contacts, glasses, hearing aids, dentures or bridgework because it cannot be worn into surgery.    Leave your suitcase in the car.  After surgery it may be brought to your room.  For patients admitted to the hospital, discharge time will be determined by your treatment team.  Patients discharged the day of surgery will not be allowed to drive home.    Special instructions:   [place]- Preparing For Surgery  Before surgery, you can play an important role. Because skin is not sterile, your skin needs to be as free of germs as possible. You can reduce the number of germs on your skin by washing with CHG (chlorahexidine gluconate) Soap before surgery.  CHG is an antiseptic cleaner which kills germs and bonds with the skin to continue killing germs even after washing.    Oral Hygiene is also important to reduce your risk of infection.  Remember - BRUSH YOUR TEETH THE MORNING OF SURGERY WITH YOUR REGULAR TOOTHPASTE  Please do not use if you have an allergy to CHG or antibacterial soaps. If your skin becomes reddened/irritated stop using the CHG.  Do not shave (including legs and underarms) for at least 48 hours prior to first CHG shower. It is OK to  shave your face.  Please follow these instructions carefully.   1. Shower the NIGHT BEFORE SURGERY and the MORNING OF SURGERY with CHG Soap.   2. If you chose to wash your hair, wash your hair first as usual with your normal shampoo.  3. After you shampoo, rinse your hair and body thoroughly to remove the shampoo.  4. Use CHG as you would any other liquid soap. You can apply CHG directly to the skin and wash gently with a scrungie or a clean washcloth.    5. Apply the CHG Soap to your body ONLY FROM THE NECK DOWN.  Do not use on open wounds or open sores. Avoid contact with your eyes, ears, mouth and genitals (private parts). Wash Face and genitals (private parts)  with your normal soap.   6. Wash thoroughly, paying special attention to the area where your surgery will be performed.  7. Thoroughly rinse your body with warm water from the neck down.  8. DO NOT shower/wash with your normal soap after using and rinsing off the CHG Soap.  9. Pat yourself dry with a CLEAN TOWEL.  10. Wear CLEAN PAJAMAS to bed the night before surgery, wear comfortable clothes the morning of surgery  11. Place CLEAN SHEETS on your bed the night of your first shower and DO NOT SLEEP WITH PETS.    Day of Surgery:  Please shower the morning of surgery with the CHG soap Do not apply any deodorants/lotions. Please wear clean clothes to the hospital/surgery center.   Remember to brush your teeth WITH YOUR REGULAR TOOTHPASTE.   Please read over the following fact sheets that you were given.

## 2019-04-06 NOTE — Telephone Encounter (Signed)
Patient girlfriend called today and stated she can't make appt with patient today but wants to know what took place in the appt today. Stated patient has no cellphone and unable to call during appt.  Please call her about the pre op appt today @ [phone]

## 2019-04-07 ENCOUNTER — Encounter (HOSPITAL_COMMUNITY)
Admission: RE | Admit: 2019-04-07 | Discharge: 2019-04-07 | Disposition: A | Discharge status: Home / Self Care | Payer: Medicare Other | Source: Ambulatory Visit | Attending: Specialist | Admitting: Specialist

## 2019-04-07 ENCOUNTER — Other Ambulatory Visit: Payer: Self-pay

## 2019-04-07 ENCOUNTER — Ambulatory Visit (HOSPITAL_COMMUNITY)
Admission: RE | Admit: 2019-04-07 | Discharge: 2019-04-07 | Disposition: A | Discharge status: Home / Self Care | Payer: Medicare Other | Source: Ambulatory Visit | Attending: Surgery | Admitting: Surgery

## 2019-04-07 ENCOUNTER — Other Ambulatory Visit (HOSPITAL_COMMUNITY)
Admission: RE | Admit: 2019-04-07 | Discharge: 2019-04-07 | Disposition: A | Discharge status: Home / Self Care | Payer: Medicare Other | Source: Ambulatory Visit | Attending: Specialist | Admitting: Specialist

## 2019-04-07 ENCOUNTER — Encounter (HOSPITAL_COMMUNITY): Payer: Self-pay

## 2019-04-07 DIAGNOSIS — I10 Essential (primary) hypertension: Secondary | ICD-10-CM | POA: Insufficient documentation

## 2019-04-07 DIAGNOSIS — Z01812 Encounter for preprocedural laboratory examination: Secondary | ICD-10-CM | POA: Insufficient documentation

## 2019-04-07 DIAGNOSIS — R000 Tachycardia, unspecified: Secondary | ICD-10-CM | POA: Diagnosis not present

## 2019-04-07 DIAGNOSIS — Z20822 Contact with and (suspected) exposure to covid-19: Secondary | ICD-10-CM | POA: Diagnosis not present

## 2019-04-07 DIAGNOSIS — Z01810 Encounter for preprocedural cardiovascular examination: Secondary | ICD-10-CM | POA: Insufficient documentation

## 2019-04-07 DIAGNOSIS — Z01818 Encounter for other preprocedural examination: Secondary | ICD-10-CM

## 2019-04-07 HISTORY — DX: Depression, unspecified: F32.A

## 2019-04-07 LAB — CBC
HCT: 40 % (ref 39.0–52.0)
Hemoglobin: 13.1 g/dL (ref 13.0–17.0)
MCH: 28.7 pg (ref 26.0–34.0)
MCHC: 32.8 g/dL (ref 30.0–36.0)
MCV: 87.5 fL (ref 80.0–100.0)
Platelets: 400 10*3/uL (ref 150–400)
RBC: 4.57 MIL/uL (ref 4.22–5.81)
RDW: 15.3 % (ref 11.5–15.5)
WBC: 7.2 10*3/uL (ref 4.0–10.5)
nRBC: 0 % (ref 0.0–0.2)

## 2019-04-07 LAB — COMPREHENSIVE METABOLIC PANEL
ALT: 20 U/L (ref 0–44)
AST: 58 U/L — ABNORMAL HIGH (ref 15–41)
Albumin: 4.2 g/dL (ref 3.5–5.0)
Alkaline Phosphatase: 102 U/L (ref 38–126)
Anion gap: 13 (ref 5–15)
BUN: 9 mg/dL (ref 6–20)
CO2: 21 mmol/L — ABNORMAL LOW (ref 22–32)
Calcium: 9 mg/dL (ref 8.9–10.3)
Chloride: 102 mmol/L (ref 98–111)
Creatinine, Ser: 1.1 mg/dL (ref 0.61–1.24)
GFR calc Af Amer: >60 mL/min (ref >60)
GFR calc non Af Amer: >60 mL/min (ref >60)
Glucose, Bld: 118 mg/dL — ABNORMAL HIGH (ref 70–99)
Potassium: 4.1 mmol/L (ref 3.5–5.1)
Sodium: 136 mmol/L (ref 135–145)
Total Bilirubin: 0.4 mg/dL (ref 0.3–1.2)
Total Protein: 7 g/dL (ref 6.5–8.1)

## 2019-04-07 LAB — TYPE AND SCREEN
ABO/RH(D): O POS
Antibody Screen: NEGATIVE

## 2019-04-07 LAB — HEMOGLOBIN A1C
Hgb A1c MFr Bld: 5.6 % (ref 4.8–5.6)
Mean Plasma Glucose: 114.02 mg/dL

## 2019-04-07 LAB — PROTIME-INR
INR: 1 (ref 0.8–1.2)
Prothrombin Time: 13.1 seconds (ref 11.4–15.2)

## 2019-04-07 LAB — SURGICAL PCR SCREEN
MRSA, PCR: NEGATIVE
Staphylococcus aureus: NEGATIVE

## 2019-04-07 LAB — ABO/RH: ABO/RH(D): O POS

## 2019-04-07 NOTE — Progress Notes (Signed)
Elevated HR 114 via dinamap at [name]  visit. EKG resulted ST at 112. [name], [place] made aware. No orders received. Patient has  no, c/o of chest pain, SOB, blurry vision, or feeling faint.

## 2019-04-07 NOTE — Progress Notes (Signed)
PCP - Dr. Sonda Rumble Cardiologist - Denies  PPM/ICD - Denies  Chest x-ray - 04/07/2019 EKG - 04/07/2019 Stress Test - Denies ECHO - 08/07/2008 Cardiac Cath - Denies  Sleep Study - Denies  Patient has a hx of pre-diabetes. Patient does not check his sugars.  Blood Thinner Instructions: N/A Aspirin Instructions: N/A  ERAS Protcol - Yes, pre-surgery water  COVID TEST- Yes 04/07/2019; pending   Coronavirus Screening  Have you experienced the following symptoms:  Cough yes/no: No Fever (>100.41F)  yes/no: No Runny nose yes/no: No Sore throat yes/no: No Difficulty breathing/shortness of breath  yes/no: No  Have you or a family member traveled in the last 14 days and where? yes/no: No   If the patient indicates "YES" to the above questions, their [name] will be rescheduled to limit the exposure to others and, the surgeon will be notified. THE PATIENT WILL NEED TO BE ASYMPTOMATIC FOR 14 DAYS.   If the patient is not experiencing any of these symptoms, the [name] nurse will instruct them to NOT bring anyone with them to their appointment since they may have these symptoms or traveled as well.   Please remind your patients and families that hospital visitation restrictions are in effect and the importance of the restrictions.     Anesthesia review: Yes, cardiac hx  Patient denies shortness of breath, fever, cough and chest pain at [name] appointment   All instructions explained to the patient, with a verbal understanding of the material. Patient agrees to go over the instructions while at home for a better understanding. Patient also instructed to self quarantine after being tested for COVID-19. The opportunity to ask questions was provided.

## 2019-04-07 NOTE — H&P (Signed)
[name] is an 54 y.o. male.   Chief Complaint: Back pain and lower extremity radiculopathy HPI: 54 year old male with the above complaint comes in for preop evaluation.  Symptoms unchanged from previous visit.  He is want to proceed with TRANSFORAMINAL LUMBAR INTERBODY FUSION L4-5 WITH PEDICLE SCREWS, RODS, CAGES, LOCAL BONE GRAFT, ALLOGRAFT AND VIVIGEN, CENTRAL DECOMPRESSION L4-5, LEFT L3-4 MICRODISCECTOMY as scheduled.  Past Medical History:  Diagnosis Date  . Anxiety   . Arthritis   . Asthma   . Duodenal adenoma   . GERD (gastroesophageal reflux disease)   . Hypertension   . Pneumonia    hx  . Post-traumatic osteoarthritis of left ankle   . Pre-diabetes   . Presence of right artificial knee joint 12/17/2016  . Schizophrenia ([place])   . Stroke [facility])    2009-or10    Past Surgical History:  Procedure Laterality Date  . ANKLE ARTHROSCOPY Left 10/20/2017   Procedure: ANKLE ARTHROSCOPY;  Surgeon: Newt Minion, MD;  Location: [facility] [facility];  Service: Orthopedics;  Laterality: Left;  . ANKLE FUSION Left 10/20/2017  . ANKLE FUSION Left 10/20/2017   Procedure: LEFT ANKLE FUSION;  Surgeon: Newt Minion, MD;  Location: [place];  Service: Orthopedics;  Laterality: Left;  . ESOPHAGOGASTRODUODENOSCOPY Left 12/05/2013   Procedure: ESOPHAGOGASTRODUODENOSCOPY (EGD);  Surgeon: Arta Silence, MD;  Location: [facility];  Service: Endoscopy;  Laterality: Left;  . ESOPHAGOGASTRODUODENOSCOPY (EGD) WITH PROPOFOL N/A 02/20/2015   Procedure: ESOPHAGOGASTRODUODENOSCOPY (EGD) WITH PROPOFOL;  Surgeon: Wilford Corner, MD;  Location: [facility];  Service: Endoscopy;  Laterality: N/A;  . HARDWARE REMOVAL Left 01/29/2018   Procedure: HARDWARE REMOVAL LEFT ANKLE;  Surgeon: Newt Minion, MD;  Location: [place];  Service: Orthopedics;  Laterality: Left;  . TOTAL KNEE ARTHROPLASTY Right 07/21/2016   Procedure: TOTAL KNEE ARTHROPLASTY;  Surgeon: Meredith Pel, MD;  Location: [place];  Service: Orthopedics;   Laterality: Right;  . UPPER GASTROINTESTINAL ENDOSCOPY      Family History  Adopted: Yes  Problem Relation Age of Onset  . Colon cancer Neg Hx   . Esophageal cancer Neg Hx   . Rectal cancer Neg Hx   . Stomach cancer Neg Hx    Social History:  reports that he has been smoking cigarettes. He has a 57.00 pack-year smoking history. He has never used smokeless tobacco. He reports previous alcohol use of about 3.0 standard drinks of alcohol per week. He reports that he does not use drugs.  Allergies: No Known Allergies  No medications prior to admission.    No results found for this or any previous visit (from the past 48 hour(s)). No results found.  Review of Systems  Constitutional: Positive for activity change.  HENT: Negative.   Eyes: Negative.   Respiratory: Negative.   Gastrointestinal: Negative.   Musculoskeletal: Positive for back pain and gait problem.  Psychiatric/Behavioral: Negative.     There were no vitals taken for this visit. Physical Exam  Constitutional: He is oriented to person, place, and time. No distress.  HENT:  Head: Normocephalic and atraumatic.  Eyes: Pupils are equal, round, and reactive to light. EOM are normal.  Cardiovascular: Normal heart sounds.  Respiratory: Effort normal. No respiratory distress. He has no wheezes.  GI: Bowel sounds are normal. He exhibits no distension. There is no abdominal tenderness.  Musculoskeletal:        General: Tenderness present.     Cervical back: Normal range of motion.  Neurological: He is alert and oriented to  person, place, and time.  Skin: Skin is warm and dry.  Psychiatric: He has a normal mood and affect.     Assessment/Plan Left L3-4 HNP, L4-5 HNP/stenosis, back pain and lower extremity radiculopathy.  We will plan to proceed with TRANSFORAMINAL LUMBAR INTERBODY FUSION L4-5 WITH PEDICLE SCREWS, RODS, CAGES, LOCAL BONE GRAFT, ALLOGRAFT AND VIVIGEN, CENTRAL DECOMPRESSION L4-5, LEFT L3-4 MICRODISCECTOMY  as scheduled.  Dr. Louanne Skye discuss surgery in detail with patient today.  Smoking cessation also discussed.  All questions answered.   Benjiman Core, PA-C 04/07/2019, 9:53 AM

## 2019-04-08 LAB — NOVEL CORONAVIRUS, NAA (HOSP ORDER, SEND-OUT TO REF LAB; TAT 18-24 HRS): SARS-CoV-2, NAA: NOT DETECTED

## 2019-04-10 MED ORDER — BUPIVACAINE LIPOSOME 1.3 % IJ SUSP
20.0000 mL | INTRAMUSCULAR | Status: AC
  Administered 2019-04-11: 10 mL
  Filled 2019-04-10: qty 20

## 2019-04-10 NOTE — Anesthesia Preprocedure Evaluation (Addendum)
Anesthesia Evaluation  Patient identified by MRN, date of birth, ID band Patient awake    Reviewed: Allergy & Precautions, NPO status , Patient's Chart, lab work & pertinent test results  History of Anesthesia Complications Negative for: history of anesthetic complications  Airway Mallampati: II  TM Distance: >3 FB Neck ROM: Full    Dental  (+) Poor Dentition, Missing, Loose, Dental Advisory Given, Chipped   Pulmonary COPD,  COPD inhaler, Current SmokerPatient did not abstain from smoking.,  04/07/2019 SARS CoV2 NEG   breath sounds clear to auscultation       Cardiovascular hypertension, Pt. on medications (-) angina Rhythm:Regular Rate:Normal  2010 ECHO: EF 55-60%, valves OK   Neuro/Psych Anxiety Depression Schizophrenia Chronic back pain: narcotics TIACVA (L sided weakness), Residual Symptoms    GI/Hepatic GERD  Medicated and Controlled,(+)     substance abuse  cocaine use,   Endo/Other  negative endocrine ROSObese Glu 113  Renal/GU negative Renal ROS     Musculoskeletal  (+) Arthritis ,   Abdominal (+) + obese,   Peds  Hematology negative hematology ROS (+)   Anesthesia Other Findings   Reproductive/Obstetrics                           Anesthesia Physical Anesthesia Plan  ASA: III  Anesthesia Plan: General   Post-op Pain Management:    Induction: Intravenous  PONV Risk Score and Plan: 2 and Ondansetron and Dexamethasone  Airway Management Planned: Oral ETT  Additional Equipment:   Intra-op Plan:   Post-operative Plan: Extubation in OR  Informed Consent: I have reviewed the patients History and Physical, chart, labs and discussed the procedure including the risks, benefits and alternatives for the proposed anesthesia with the patient or authorized representative who has indicated his/her understanding and acceptance.     Dental advisory given  Plan Discussed with:  CRNA and Surgeon  Anesthesia Plan Comments: (See PAT note written 04/10/2019 by Myra Gianotti, PA-C. )      Anesthesia Quick Evaluation

## 2019-04-10 NOTE — Progress Notes (Signed)
Anesthesia Chart Review:  Case: [id number] Date/Time: 04/11/19 0715   Procedure: TRANSFORAMINAL LUMBAR INTERBODY FUSION L4-5 WITH PEDICLE SCREWS, RODS, CAGES, LOCAL BONE GRAFT, ALLOGRAFT AND VIVIGEN, CENTRAL DECOMPRESSION L4-5, LEFT L3-4 MICRODISCECTOMY (N/A )   Anesthesia type: General   Pre-op diagnosis: L4-5 severe spinal stenosis, L4-5 spondylolisthesis, left L3-4 herniated disc   Location: [facility] OR ROOM 05 / [place] [facility]   Surgeons: Jessy Oto, MD      DISCUSSION: Patient is a 54 year old male scheduled for the above procedure.  History includes smoking, HTN, asthma, GERD, anxiety, depression (with history of SI/HI), Schizophrenia, PTSD, pre-diabetes, TIA (Admitted 09/2007 for left sided numbness, possible TIA, no acute infarct on MRA). S/p right TKA 07/21/16, s/p left ankle fusion 10/20/17 with hardware removal 01/29/18. History of substance abuse (primarily tobacco and alcohol, reportedly sober from alcohol since 2015 although elevated ethyl alcohol level 11/21/18 and had positive cocaine in UDS 06/22/18; No current illicit drug use reported at PAT.). BMI is consistent with mild obesity.   Patient with mild tachycardia at PAT. BP 124/90. Denied chest pain, SOB, visual changes or presyncope. EKG showed ST at 112. CXR without acute abnormality and labs acceptable (CBC WNL, Cr 1.10, glucose 118, ALT 20, AST mildly elevated at 58, A1c 5.6%). Reportedly sober now. Anesthesia team to evaluate on the day of surgery. 04/07/19 COVID-19 test negative.    VS: BP 124/90   Pulse (!) 114   Temp 36.9 C (Oral)   Resp 18   Ht 5\' 9"  (1.753 m)   Wt 96.7 kg   SpO2 99%   BMI 31.49 kg/m   PROVIDERS: Gladys Damme, MD is PCP    LABS: Labs reviewed: Acceptable for surgery. (all labs ordered are listed, but only abnormal results are displayed)  Labs Reviewed  COMPREHENSIVE METABOLIC PANEL - Abnormal; Notable for the following components:      Result Value   CO2 21 (*)    Glucose, Bld 118 (*)    AST 58 (*)    All other components within normal limits  SURGICAL PCR SCREEN  HEMOGLOBIN A1C  CBC  PROTIME-INR  TYPE AND SCREEN  ABO/RH     IMAGES: CXR 04/07/19: FINDINGS: Lungs are clear. The heart size and pulmonary vascularity are normal. No adenopathy. No bone lesions. IMPRESSION: Lungs clear.  No evident adenopathy.  Stable cardiac silhouette.  MRI L-spine 09/21/18: IMPRESSION: - Transitional lumbosacral anatomy. Please see numbering scheme above and correlate with plain films if intervention is planned. - Severe central canal, bilateral subarticular recess and left foraminal narrowing at L4-5 where advanced facet degenerative disease results in 0.5 cm anterolisthesis. - Disc bulge with a superimposed left subarticular recess extrusion at L3-4. The extrusion could impact the descending left L4 root.    EKG: 04/06/18: Sinus tachycardia at 112 bpm Otherwise normal ECG Confirmed by Loralie Champagne [phone]) on 04/07/2019 9:06:02 PM   CV: Echo 08/07/08: Left ventricle: The cavity size was normal. Systolic function was  normal. The estimated ejection fraction was in the range of 55% to  60%. Images were inadequate for LV wall motion assessment. Findings  consistent with left ventricular diastolic dysfunction.  Aortic valve: Trileaflet; normal thickness leaflets. Mobility was  not restricted. Doppler: Transvalvular velocity was within the  normal range. There was no stenosis. No regurgitation.  Aorta: Aortic root: The aortic root was normal in size.  Mitral valve: Structurally normal valve. Mobility was not  restricted. Doppler: Transvalvular velocity was within the normal  range. There was  no evidence for stenosis. No regurgitation.  Left atrium: The atrium was normal in size.  Right ventricle: The cavity size was normal. Wall thickness was  normal. Systolic function was normal.  Pulmonic valve: Doppler: Transvalvular velocity was within the  normal range. There was no  evidence for stenosis.  Tricuspid valve: Structurally normal valve. Doppler: Transvalvular  velocity was within the normal range. No regurgitation.  Pulmonary artery: The main pulmonary artery was normal-sized.  Systolic pressure was within the normal range.  Right atrium: The atrium was normal in size.  Pericardium: There was no pericardial effusion.  Systemic veins:  Inferior vena cava: The vessel was normal in size.    Past Medical History:  Diagnosis Date  . Anxiety   . Arthritis   . Asthma   . Depression   . Duodenal adenoma   . GERD (gastroesophageal reflux disease)   . Hypertension   . Pneumonia    hx  . Post-traumatic osteoarthritis of left ankle   . Pre-diabetes   . Presence of right artificial knee joint 12/17/2016  . Schizophrenia ([place])   . Stroke [facility])    2009-or10    Past Surgical History:  Procedure Laterality Date  . ANKLE ARTHROSCOPY Left 10/20/2017   Procedure: ANKLE ARTHROSCOPY;  Surgeon: Newt Minion, MD;  Location: [place];  Service: Orthopedics;  Laterality: Left;  . ANKLE FUSION Left 10/20/2017  . ANKLE FUSION Left 10/20/2017   Procedure: LEFT ANKLE FUSION;  Surgeon: Newt Minion, MD;  Location: [place];  Service: Orthopedics;  Laterality: Left;  . ESOPHAGOGASTRODUODENOSCOPY Left 12/05/2013   Procedure: ESOPHAGOGASTRODUODENOSCOPY (EGD);  Surgeon: Arta Silence, MD;  Location: [facility];  Service: Endoscopy;  Laterality: Left;  . ESOPHAGOGASTRODUODENOSCOPY (EGD) WITH PROPOFOL N/A 02/20/2015   Procedure: ESOPHAGOGASTRODUODENOSCOPY (EGD) WITH PROPOFOL;  Surgeon: Wilford Corner, MD;  Location: [facility];  Service: Endoscopy;  Laterality: N/A;  . HARDWARE REMOVAL Left 01/29/2018   Procedure: HARDWARE REMOVAL LEFT ANKLE;  Surgeon: Newt Minion, MD;  Location: [place];  Service: Orthopedics;  Laterality: Left;  . JOINT REPLACEMENT    . TOTAL KNEE ARTHROPLASTY Right 07/21/2016   Procedure: TOTAL KNEE ARTHROPLASTY;  Surgeon: Meredith Pel, MD;   Location: [place];  Service: Orthopedics;  Laterality: Right;  . UPPER GASTROINTESTINAL ENDOSCOPY      MEDICATIONS: . albuterol (PROVENTIL HFA;VENTOLIN HFA) 108 (90 Base) MCG/ACT inhaler  . amLODipine (NORVASC) 2.5 MG tablet  . benztropine (COGENTIN) 1 MG tablet  . buPROPion (WELLBUTRIN XL) 300 MG 24 hr tablet  . cetirizine (ZYRTEC) 10 MG tablet  . INVEGA TRINZA 819 MG/2.625ML SUSY  . Melatonin 5 MG TABS  . mirtazapine (REMERON) 15 MG tablet  . Multiple Vitamins-Minerals (VITRUM SENIOR) TABS  . oxyCODONE-acetaminophen (PERCOCET) 7.5-325 MG tablet  . sodium chloride (OCEAN) 0.65 % SOLN nasal spray  . sucralfate (CARAFATE) 1 g tablet  . buPROPion (WELLBUTRIN XL) 150 MG 24 hr tablet  . gabapentin (NEURONTIN) 300 MG capsule  . ibuprofen (ADVIL) 600 MG tablet  . losartan (COZAAR) 100 MG tablet  . omeprazole (PRILOSEC) 40 MG capsule   No current facility-administered medications for this encounter.    Myra Gianotti, PA-C Surgical Short Stay/Anesthesiology [facility] Phone [phone] [facility] Phone [phone] 04/10/2019 10:22 AM

## 2019-04-11 ENCOUNTER — Encounter (HOSPITAL_COMMUNITY)
Admission: RE | Disposition: A | Discharge status: Home / Self Care | Payer: Self-pay | Source: Home / Self Care | Attending: Specialist

## 2019-04-11 ENCOUNTER — Inpatient Hospital Stay (HOSPITAL_COMMUNITY): Payer: Medicare Other

## 2019-04-11 ENCOUNTER — Encounter (HOSPITAL_COMMUNITY): Payer: Self-pay | Admitting: Specialist

## 2019-04-11 ENCOUNTER — Inpatient Hospital Stay (HOSPITAL_COMMUNITY): Payer: Medicare Other | Admitting: Vascular Surgery

## 2019-04-11 ENCOUNTER — Other Ambulatory Visit: Payer: Self-pay

## 2019-04-11 ENCOUNTER — Inpatient Hospital Stay (HOSPITAL_COMMUNITY)
Admission: RE | Admit: 2019-04-11 | Discharge: 2019-04-13 | DRG: 455 | Disposition: A | Discharge status: Home / Self Care | Payer: Medicare Other | Attending: Specialist | Admitting: Specialist

## 2019-04-11 DIAGNOSIS — M4316 Spondylolisthesis, lumbar region: Secondary | ICD-10-CM

## 2019-04-11 DIAGNOSIS — Z419 Encounter for procedure for purposes other than remedying health state, unspecified: Secondary | ICD-10-CM

## 2019-04-11 DIAGNOSIS — M5126 Other intervertebral disc displacement, lumbar region: Secondary | ICD-10-CM

## 2019-04-11 DIAGNOSIS — Z8673 Personal history of transient ischemic attack (TIA), and cerebral infarction without residual deficits: Secondary | ICD-10-CM

## 2019-04-11 DIAGNOSIS — R7303 Prediabetes: Secondary | ICD-10-CM | POA: Diagnosis not present

## 2019-04-11 DIAGNOSIS — F17210 Nicotine dependence, cigarettes, uncomplicated: Secondary | ICD-10-CM | POA: Diagnosis present

## 2019-04-11 DIAGNOSIS — M19172 Post-traumatic osteoarthritis, left ankle and foot: Secondary | ICD-10-CM | POA: Diagnosis not present

## 2019-04-11 DIAGNOSIS — I10 Essential (primary) hypertension: Secondary | ICD-10-CM | POA: Diagnosis not present

## 2019-04-11 DIAGNOSIS — M48062 Spinal stenosis, lumbar region with neurogenic claudication: Secondary | ICD-10-CM

## 2019-04-11 DIAGNOSIS — M5116 Intervertebral disc disorders with radiculopathy, lumbar region: Secondary | ICD-10-CM | POA: Diagnosis not present

## 2019-04-11 DIAGNOSIS — Z96651 Presence of right artificial knee joint: Secondary | ICD-10-CM | POA: Diagnosis present

## 2019-04-11 DIAGNOSIS — K219 Gastro-esophageal reflux disease without esophagitis: Secondary | ICD-10-CM | POA: Diagnosis not present

## 2019-04-11 DIAGNOSIS — F209 Schizophrenia, unspecified: Secondary | ICD-10-CM | POA: Diagnosis not present

## 2019-04-11 DIAGNOSIS — Z981 Arthrodesis status: Secondary | ICD-10-CM | POA: Diagnosis not present

## 2019-04-11 DIAGNOSIS — M549 Dorsalgia, unspecified: Secondary | ICD-10-CM | POA: Diagnosis present

## 2019-04-11 LAB — GLUCOSE, CAPILLARY: Glucose-Capillary: 113 mg/dL — ABNORMAL HIGH (ref 70–99)

## 2019-04-11 SURGERY — POSTERIOR LUMBAR FUSION 1 LEVEL
Anesthesia: General | Site: Spine Lumbar

## 2019-04-11 MED ORDER — ROCURONIUM BROMIDE 50 MG/5ML IV SOSY
PREFILLED_SYRINGE | INTRAVENOUS | Status: DC | PRN
  Administered 2019-04-11 (×3): 20 mg via INTRAVENOUS
  Administered 2019-04-11: 60 mg via INTRAVENOUS

## 2019-04-11 MED ORDER — ONDANSETRON HCL 4 MG/2ML IJ SOLN: 4.0000 mg | Freq: Four times a day (QID) | INTRAMUSCULAR | Status: DC | PRN

## 2019-04-11 MED ORDER — PROPOFOL 10 MG/ML IV BOLUS
INTRAVENOUS | Status: DC | PRN
  Administered 2019-04-11: 200 mg via INTRAVENOUS

## 2019-04-11 MED ORDER — MORPHINE SULFATE (PF) 2 MG/ML IV SOLN
1.0000 mg | INTRAVENOUS | Status: DC | PRN
  Administered 2019-04-11 – 2019-04-12 (×2): 1 mg via INTRAVENOUS
  Filled 2019-04-11 (×2): qty 1

## 2019-04-11 MED ORDER — MIDAZOLAM HCL 5 MG/5ML IJ SOLN
INTRAMUSCULAR | Status: DC | PRN
  Administered 2019-04-11: 2 mg via INTRAVENOUS

## 2019-04-11 MED ORDER — HYDROMORPHONE HCL 1 MG/ML IJ SOLN
INTRAMUSCULAR | Status: AC
  Filled 2019-04-11: qty 1

## 2019-04-11 MED ORDER — LIDOCAINE 2% (20 MG/ML) 5 ML SYRINGE
INTRAMUSCULAR | Status: DC | PRN
  Administered 2019-04-11: 100 mg via INTRAVENOUS

## 2019-04-11 MED ORDER — OXYCODONE HCL 5 MG PO TABS
10.0000 mg | ORAL_TABLET | ORAL | Status: DC | PRN
  Administered 2019-04-11 – 2019-04-13 (×11): 10 mg via ORAL
  Filled 2019-04-11 (×10): qty 2

## 2019-04-11 MED ORDER — AMLODIPINE BESYLATE 5 MG PO TABS
2.5000 mg | ORAL_TABLET | Freq: Every day | ORAL | Status: DC
  Administered 2019-04-11 – 2019-04-13 (×3): 2.5 mg via ORAL
  Filled 2019-04-11 (×3): qty 1

## 2019-04-11 MED ORDER — OXYCODONE HCL 5 MG PO TABS
ORAL_TABLET | ORAL | Status: AC
  Filled 2019-04-11: qty 2

## 2019-04-11 MED ORDER — ACETAMINOPHEN 325 MG PO TABS
650.0000 mg | ORAL_TABLET | ORAL | Status: DC | PRN
  Administered 2019-04-12 – 2019-04-13 (×3): 650 mg via ORAL
  Filled 2019-04-11 (×3): qty 2

## 2019-04-11 MED ORDER — 0.9 % SODIUM CHLORIDE (POUR BTL) OPTIME
TOPICAL | Status: DC | PRN
  Administered 2019-04-11: 08:00:00 1000 mL

## 2019-04-11 MED ORDER — SODIUM CHLORIDE 0.9% FLUSH: 3.0000 mL | INTRAVENOUS | Status: DC | PRN

## 2019-04-11 MED ORDER — BENZTROPINE MESYLATE 1 MG PO TABS
1.0000 mg | ORAL_TABLET | Freq: Every day | ORAL | Status: DC
  Administered 2019-04-11 – 2019-04-13 (×3): 1 mg via ORAL
  Filled 2019-04-11 (×3): qty 1

## 2019-04-11 MED ORDER — PHENYLEPHRINE HCL-NACL 10-0.9 MG/250ML-% IV SOLN
INTRAVENOUS | Status: DC | PRN
  Administered 2019-04-11: 40 ug/min via INTRAVENOUS
  Administered 2019-04-11: 30 ug/min via INTRAVENOUS

## 2019-04-11 MED ORDER — ROCURONIUM BROMIDE 10 MG/ML (PF) SYRINGE
PREFILLED_SYRINGE | INTRAVENOUS | Status: AC
  Filled 2019-04-11: qty 10

## 2019-04-11 MED ORDER — HYDROMORPHONE HCL 1 MG/ML IJ SOLN: 0.2500 mg | INTRAMUSCULAR | Status: DC | PRN

## 2019-04-11 MED ORDER — GABAPENTIN 300 MG PO CAPS
300.0000 mg | ORAL_CAPSULE | Freq: Three times a day (TID) | ORAL | Status: DC
  Administered 2019-04-11 – 2019-04-13 (×6): 300 mg via ORAL
  Filled 2019-04-11 (×5): qty 1

## 2019-04-11 MED ORDER — LOSARTAN POTASSIUM 50 MG PO TABS: 100.0000 mg | ORAL_TABLET | Freq: Every day | ORAL | Status: DC

## 2019-04-11 MED ORDER — THROMBIN 20000 UNITS EX SOLR
CUTANEOUS | Status: DC | PRN
  Administered 2019-04-11: 20 mL via TOPICAL

## 2019-04-11 MED ORDER — FENTANYL CITRATE (PF) 100 MCG/2ML IJ SOLN
INTRAMUSCULAR | Status: DC | PRN
  Administered 2019-04-11 (×3): 50 ug via INTRAVENOUS
  Administered 2019-04-11: 100 ug via INTRAVENOUS
  Administered 2019-04-11: 50 ug via INTRAVENOUS
  Administered 2019-04-11: 100 ug via INTRAVENOUS
  Administered 2019-04-11 (×2): 50 ug via INTRAVENOUS

## 2019-04-11 MED ORDER — PANTOPRAZOLE SODIUM 40 MG IV SOLR: 40.0000 mg | Freq: Every day | INTRAVENOUS | Status: DC

## 2019-04-11 MED ORDER — SODIUM CHLORIDE 0.9% FLUSH
3.0000 mL | Freq: Two times a day (BID) | INTRAVENOUS | Status: DC
  Administered 2019-04-12: 3 mL via INTRAVENOUS

## 2019-04-11 MED ORDER — METHOCARBAMOL 500 MG PO TABS
ORAL_TABLET | ORAL | Status: AC
  Filled 2019-04-11: qty 1

## 2019-04-11 MED ORDER — CEFAZOLIN SODIUM-DEXTROSE 2-4 GM/100ML-% IV SOLN
2.0000 g | INTRAVENOUS | Status: AC
  Administered 2019-04-11 (×2): 2 g via INTRAVENOUS
  Filled 2019-04-11: qty 100

## 2019-04-11 MED ORDER — VITRUM SENIOR PO TABS: 1.0000 | ORAL_TABLET | Freq: Every day | ORAL | Status: DC

## 2019-04-11 MED ORDER — DEXAMETHASONE SODIUM PHOSPHATE 10 MG/ML IJ SOLN
INTRAMUSCULAR | Status: AC
  Filled 2019-04-11: qty 1

## 2019-04-11 MED ORDER — BISACODYL 5 MG PO TBEC
5.0000 mg | DELAYED_RELEASE_TABLET | Freq: Every day | ORAL | Status: DC | PRN
  Administered 2019-04-12: 5 mg via ORAL
  Filled 2019-04-11: qty 1

## 2019-04-11 MED ORDER — SODIUM CHLORIDE 0.9 % IV SOLN: INTRAVENOUS | Status: DC

## 2019-04-11 MED ORDER — BUPIVACAINE HCL 0.5 % IJ SOLN
INTRAMUSCULAR | Status: DC | PRN
  Administered 2019-04-11: 10 mL

## 2019-04-11 MED ORDER — BUPROPION HCL ER (XL) 300 MG PO TB24
300.0000 mg | ORAL_TABLET | Freq: Every day | ORAL | Status: DC
  Administered 2019-04-11 – 2019-04-13 (×3): 300 mg via ORAL
  Filled 2019-04-11 (×3): qty 1

## 2019-04-11 MED ORDER — BUPIVACAINE HCL (PF) 0.5 % IJ SOLN
INTRAMUSCULAR | Status: AC
  Filled 2019-04-11: qty 30

## 2019-04-11 MED ORDER — PHENOL 1.4 % MT LIQD: 1.0000 | OROMUCOSAL | Status: DC | PRN

## 2019-04-11 MED ORDER — METHOCARBAMOL 1000 MG/10ML IJ SOLN
500.0000 mg | Freq: Four times a day (QID) | INTRAVENOUS | Status: DC | PRN
  Filled 2019-04-11: qty 5

## 2019-04-11 MED ORDER — ONDANSETRON HCL 4 MG PO TABS: 4.0000 mg | ORAL_TABLET | Freq: Four times a day (QID) | ORAL | Status: DC | PRN

## 2019-04-11 MED ORDER — CEFAZOLIN SODIUM-DEXTROSE 2-4 GM/100ML-% IV SOLN
2.0000 g | Freq: Three times a day (TID) | INTRAVENOUS | Status: AC
  Administered 2019-04-11 – 2019-04-12 (×2): 2 g via INTRAVENOUS
  Filled 2019-04-11 (×2): qty 100

## 2019-04-11 MED ORDER — MELATONIN 3 MG PO TABS
6.0000 mg | ORAL_TABLET | Freq: Every day | ORAL | Status: DC
  Filled 2019-04-11 (×3): qty 2

## 2019-04-11 MED ORDER — MIDAZOLAM HCL 2 MG/2ML IJ SOLN: 0.5000 mg | Freq: Once | INTRAMUSCULAR | Status: DC | PRN

## 2019-04-11 MED ORDER — AMLODIPINE BESYLATE 5 MG PO TABS
2.5000 mg | ORAL_TABLET | ORAL | Status: AC
  Administered 2019-04-11: 2.5 mg via ORAL
  Filled 2019-04-11: qty 1

## 2019-04-11 MED ORDER — LIDOCAINE 2% (20 MG/ML) 5 ML SYRINGE
INTRAMUSCULAR | Status: AC
  Filled 2019-04-11: qty 5

## 2019-04-11 MED ORDER — PROMETHAZINE HCL 25 MG/ML IJ SOLN: 6.2500 mg | INTRAMUSCULAR | Status: DC | PRN

## 2019-04-11 MED ORDER — ACETAMINOPHEN 650 MG RE SUPP: 650.0000 mg | RECTAL | Status: DC | PRN

## 2019-04-11 MED ORDER — METHOCARBAMOL 500 MG PO TABS
500.0000 mg | ORAL_TABLET | Freq: Four times a day (QID) | ORAL | Status: DC | PRN
  Administered 2019-04-11 – 2019-04-13 (×7): 500 mg via ORAL
  Filled 2019-04-11 (×6): qty 1

## 2019-04-11 MED ORDER — BUPROPION HCL ER (XL) 150 MG PO TB24: 150.0000 mg | ORAL_TABLET | Freq: Every day | ORAL | Status: DC

## 2019-04-11 MED ORDER — MIDAZOLAM HCL 2 MG/2ML IJ SOLN
INTRAMUSCULAR | Status: AC
  Filled 2019-04-11: qty 2

## 2019-04-11 MED ORDER — SUCRALFATE 1 G PO TABS
1.0000 g | ORAL_TABLET | Freq: Three times a day (TID) | ORAL | Status: DC
  Administered 2019-04-11 – 2019-04-13 (×7): 1 g via ORAL
  Filled 2019-04-11 (×11): qty 1

## 2019-04-11 MED ORDER — PALIPERIDONE PALMITATE ER 819 MG/2.625ML IM SUSY: 819.0000 mg | PREFILLED_SYRINGE | INTRAMUSCULAR | Status: DC

## 2019-04-11 MED ORDER — FLEET ENEMA 7-19 GM/118ML RE ENEM: 1.0000 | ENEMA | Freq: Once | RECTAL | Status: DC | PRN

## 2019-04-11 MED ORDER — POLYETHYLENE GLYCOL 3350 17 G PO PACK
17.0000 g | PACK | Freq: Every day | ORAL | Status: DC | PRN
  Administered 2019-04-12: 17 g via ORAL
  Filled 2019-04-11: qty 1

## 2019-04-11 MED ORDER — INSULIN ASPART 100 UNIT/ML ~~LOC~~ SOLN: 0.0000 [IU] | Freq: Three times a day (TID) | SUBCUTANEOUS | Status: DC

## 2019-04-11 MED ORDER — PHENYLEPHRINE 40 MCG/ML (10ML) SYRINGE FOR IV PUSH (FOR BLOOD PRESSURE SUPPORT)
PREFILLED_SYRINGE | INTRAVENOUS | Status: AC
  Filled 2019-04-11: qty 10

## 2019-04-11 MED ORDER — MEPERIDINE HCL 25 MG/ML IJ SOLN: 6.2500 mg | INTRAMUSCULAR | Status: DC | PRN

## 2019-04-11 MED ORDER — SALINE SPRAY 0.65 % NA SOLN: 1.0000 | NASAL | Status: DC | PRN

## 2019-04-11 MED ORDER — FENTANYL CITRATE (PF) 250 MCG/5ML IJ SOLN
INTRAMUSCULAR | Status: AC
  Filled 2019-04-11: qty 5

## 2019-04-11 MED ORDER — PROPOFOL 10 MG/ML IV BOLUS
INTRAVENOUS | Status: AC
  Filled 2019-04-11: qty 40

## 2019-04-11 MED ORDER — PANTOPRAZOLE SODIUM 40 MG PO TBEC
80.0000 mg | DELAYED_RELEASE_TABLET | Freq: Every day | ORAL | Status: DC
  Administered 2019-04-12 – 2019-04-13 (×2): 80 mg via ORAL
  Filled 2019-04-11 (×2): qty 2

## 2019-04-11 MED ORDER — SUGAMMADEX SODIUM 200 MG/2ML IV SOLN
INTRAVENOUS | Status: DC | PRN
  Administered 2019-04-11: 200 mg via INTRAVENOUS

## 2019-04-11 MED ORDER — HEMOSTATIC AGENTS (NO CHARGE) OPTIME
TOPICAL | Status: DC | PRN
  Administered 2019-04-11: 1 via TOPICAL

## 2019-04-11 MED ORDER — MENTHOL 3 MG MT LOZG: 1.0000 | LOZENGE | OROMUCOSAL | Status: DC | PRN

## 2019-04-11 MED ORDER — SODIUM CHLORIDE 0.9 % IV SOLN: 250.0000 mL | INTRAVENOUS | Status: DC

## 2019-04-11 MED ORDER — ONDANSETRON HCL 4 MG/2ML IJ SOLN
INTRAMUSCULAR | Status: AC
  Filled 2019-04-11: qty 2

## 2019-04-11 MED ORDER — DOCUSATE SODIUM 100 MG PO CAPS
100.0000 mg | ORAL_CAPSULE | Freq: Two times a day (BID) | ORAL | Status: DC
  Administered 2019-04-11 – 2019-04-13 (×4): 100 mg via ORAL
  Filled 2019-04-11 (×5): qty 1

## 2019-04-11 MED ORDER — CHLORHEXIDINE GLUCONATE 4 % EX LIQD: 60.0000 mL | Freq: Once | CUTANEOUS | Status: DC

## 2019-04-11 MED ORDER — OXYCODONE HCL 5 MG PO TABS: 5.0000 mg | ORAL_TABLET | ORAL | Status: DC | PRN

## 2019-04-11 MED ORDER — MIRTAZAPINE 15 MG PO TABS
15.0000 mg | ORAL_TABLET | Freq: Every day | ORAL | Status: DC
  Administered 2019-04-11 – 2019-04-12 (×2): 15 mg via ORAL
  Filled 2019-04-11 (×3): qty 1

## 2019-04-11 MED ORDER — ALBUTEROL SULFATE HFA 108 (90 BASE) MCG/ACT IN AERS: 1.0000 | INHALATION_SPRAY | Freq: Two times a day (BID) | RESPIRATORY_TRACT | Status: DC | PRN

## 2019-04-11 MED ORDER — LORATADINE 10 MG PO TABS
10.0000 mg | ORAL_TABLET | Freq: Every day | ORAL | Status: DC
  Administered 2019-04-12 – 2019-04-13 (×2): 10 mg via ORAL
  Filled 2019-04-11 (×2): qty 1

## 2019-04-11 MED ORDER — OXYCODONE HCL ER 15 MG PO T12A
15.0000 mg | EXTENDED_RELEASE_TABLET | Freq: Two times a day (BID) | ORAL | Status: DC
  Administered 2019-04-11 – 2019-04-13 (×5): 15 mg via ORAL
  Filled 2019-04-11 (×5): qty 1

## 2019-04-11 MED ORDER — THROMBIN 20000 UNITS EX SOLR
CUTANEOUS | Status: AC
  Filled 2019-04-11: qty 20000

## 2019-04-11 MED ORDER — LACTATED RINGERS IV SOLN: INTRAVENOUS | Status: DC | PRN

## 2019-04-11 MED ORDER — PROPOFOL 500 MG/50ML IV EMUL
INTRAVENOUS | Status: DC | PRN
  Administered 2019-04-11: 25 ug/kg/min via INTRAVENOUS

## 2019-04-11 MED ORDER — ALBUMIN HUMAN 5 % IV SOLN: INTRAVENOUS | Status: DC | PRN

## 2019-04-11 MED ORDER — ALUM & MAG HYDROXIDE-SIMETH 200-200-20 MG/5ML PO SUSP: 30.0000 mL | Freq: Four times a day (QID) | ORAL | Status: DC | PRN

## 2019-04-11 MED ORDER — ONDANSETRON HCL 4 MG/2ML IJ SOLN
INTRAMUSCULAR | Status: DC | PRN
  Administered 2019-04-11: 4 mg via INTRAVENOUS

## 2019-04-11 SURGICAL SUPPLY — 78 items
BLADE CLIPPER SURG (BLADE) IMPLANT
BONE CANC CHIPS 20CC PCAN1/4 (Bone Implant) ×2 IMPLANT
BONE VIVIGEN FORMABLE 5.4CC (Bone Implant) ×2 IMPLANT
BUR MATCHSTICK NEURO 3.0 LAGG (BURR) ×2 IMPLANT
BUR SABER RD CUTTING 3.0 (BURR) IMPLANT
CHIPS CANC BONE 20CC PCAN1/4 (Bone Implant) ×1 IMPLANT
COVER BACK TABLE 80X110 HD (DRAPES) ×2 IMPLANT
COVER SURGICAL LIGHT HANDLE (MISCELLANEOUS) ×2 IMPLANT
COVER WAND RF STERILE (DRAPES) IMPLANT
DECANTER SPIKE VIAL GLASS SM (MISCELLANEOUS) IMPLANT
DERMABOND ADVANCED (GAUZE/BANDAGES/DRESSINGS)
DERMABOND ADVANCED .7 DNX12 (GAUZE/BANDAGES/DRESSINGS) IMPLANT
DRAPE C-ARM 42X72 X-RAY (DRAPES) ×2 IMPLANT
DRAPE C-ARMOR (DRAPES) ×2 IMPLANT
DRAPE INCISE IOBAN 66X45 STRL (DRAPES) ×2 IMPLANT
DRAPE MICROSCOPE LEICA (MISCELLANEOUS) ×2 IMPLANT
DRAPE POUCH INSTRU U-SHP 10X18 (DRAPES) ×2 IMPLANT
DRAPE SURG 17X23 STRL (DRAPES) ×8 IMPLANT
DRSG MEPILEX BORDER 4X4 (GAUZE/BANDAGES/DRESSINGS) IMPLANT
DRSG MEPILEX BORDER 4X8 (GAUZE/BANDAGES/DRESSINGS) ×2 IMPLANT
DURAPREP 26ML APPLICATOR (WOUND CARE) ×2 IMPLANT
ELECT BLADE 4.0 EZ CLEAN MEGAD (MISCELLANEOUS) ×2
ELECT BLADE 6.5 EXT (BLADE) IMPLANT
ELECT CAUTERY BLADE 6.4 (BLADE) ×2 IMPLANT
ELECT REM PT RETURN 9FT ADLT (ELECTROSURGICAL) ×2
ELECTRODE BLDE 4.0 EZ CLN MEGD (MISCELLANEOUS) ×1 IMPLANT
ELECTRODE REM PT RTRN 9FT ADLT (ELECTROSURGICAL) ×1 IMPLANT
EVACUATOR 1/8 PVC DRAIN (DRAIN) IMPLANT
GAUZE SPONGE 4X4 12PLY STRL (GAUZE/BANDAGES/DRESSINGS) ×2 IMPLANT
GLOVE BIOGEL PI IND STRL 8 (GLOVE) ×1 IMPLANT
GLOVE BIOGEL PI INDICATOR 8 (GLOVE) ×1
GLOVE BIOGEL PI ORTHO PRO SZ8 (GLOVE) ×2
GLOVE ECLIPSE 9.0 STRL (GLOVE) ×2 IMPLANT
GLOVE ORTHO TXT STRL SZ7.5 (GLOVE) ×2 IMPLANT
GLOVE PI ORTHO PRO STRL SZ8 (GLOVE) ×2 IMPLANT
GLOVE SURG 8.5 LATEX PF (GLOVE) ×2 IMPLANT
GOWN STRL REUS W/ TWL LRG LVL3 (GOWN DISPOSABLE) ×1 IMPLANT
GOWN STRL REUS W/TWL 2XL LVL3 (GOWN DISPOSABLE) ×4 IMPLANT
GOWN STRL REUS W/TWL LRG LVL3 (GOWN DISPOSABLE) ×1
INTERBODY SABLE 10X26 7-14 15D (Miscellaneous) ×2 IMPLANT
KIT BASIN OR (CUSTOM PROCEDURE TRAY) ×2 IMPLANT
KIT POSITION SURG JACKSON T1 (MISCELLANEOUS) ×2 IMPLANT
KIT TURNOVER KIT B (KITS) ×2 IMPLANT
NEEDLE 22X1 1/2 (OR ONLY) (NEEDLE) ×2 IMPLANT
NEEDLE SPNL 18GX3.5 QUINCKE PK (NEEDLE) ×2 IMPLANT
NS IRRIG 1000ML POUR BTL (IV SOLUTION) ×2 IMPLANT
PACK LAMINECTOMY ORTHO (CUSTOM PROCEDURE TRAY) ×2 IMPLANT
PAD ARMBOARD 7.5X6 YLW CONV (MISCELLANEOUS) ×4 IMPLANT
PATTIES SURGICAL .75X.75 (GAUZE/BANDAGES/DRESSINGS) ×2 IMPLANT
PATTIES SURGICAL 1X1 (DISPOSABLE) ×2 IMPLANT
ROD PRE BENT EXP 40MM (Rod) ×4 IMPLANT
SCREW CORTICAL VIPER 7X40MM (Screw) ×4 IMPLANT
SCREW CORTICAL VIPER 7X45MM (Screw) ×4 IMPLANT
SCREW SET SINGLE INNER (Screw) ×8 IMPLANT
SLEEVE SURGEON STRL (DRAPES) ×2 IMPLANT
SPOGE SURGIFLO 8M (HEMOSTASIS) ×1
SPONGE LAP 4X18 RFD (DISPOSABLE) ×2 IMPLANT
SPONGE SURGIFLO 8M (HEMOSTASIS) ×1 IMPLANT
SPONGE SURGIFOAM ABS GEL 100 (HEMOSTASIS) ×2 IMPLANT
SUT VIC AB 0 CT1 27 (SUTURE) ×1
SUT VIC AB 0 CT1 27XBRD ANBCTR (SUTURE) ×1 IMPLANT
SUT VIC AB 1 CTX 36 (SUTURE) ×2
SUT VIC AB 1 CTX36XBRD ANBCTR (SUTURE) ×2 IMPLANT
SUT VIC AB 2-0 CT1 27 (SUTURE) ×1
SUT VIC AB 2-0 CT1 TAPERPNT 27 (SUTURE) ×1 IMPLANT
SUT VIC AB 3-0 X1 27 (SUTURE) ×2 IMPLANT
SYR 20ML LL LF (SYRINGE) ×2 IMPLANT
SYR CONTROL 10ML LL (SYRINGE) ×4 IMPLANT
TAP EXPEDIUM DL 4.35 (INSTRUMENTS) ×2 IMPLANT
TAP EXPEDIUM DL 5.0 (INSTRUMENTS) ×2 IMPLANT
TAP EXPEDIUM DL 6.0 (INSTRUMENTS) ×2 IMPLANT
TAP EXPEDIUM DL 7.0 (INSTRUMENTS) ×1
TAP EXPEDIUM DL 7X2 (INSTRUMENTS) ×1 IMPLANT
TOWEL GREEN STERILE (TOWEL DISPOSABLE) ×2 IMPLANT
TOWEL GREEN STERILE FF (TOWEL DISPOSABLE) ×2 IMPLANT
TRAY FOLEY MTR SLVR 16FR STAT (SET/KITS/TRAYS/PACK) ×2 IMPLANT
WATER STERILE IRR 1000ML POUR (IV SOLUTION) IMPLANT
YANKAUER SUCT BULB TIP NO VENT (SUCTIONS) ×2 IMPLANT

## 2019-04-11 NOTE — Anesthesia Postprocedure Evaluation (Signed)
Anesthesia Post Note  Patient: [name]  Procedure(s) Performed: TRANSFORAMINAL LUMBAR INTERBODY FUSION L4-5 WITH PEDICLE SCREWS, RODS, CAGES, LOCAL BONE GRAFT, ALLOGRAFT AND VIVIGEN, CENTRAL DECOMPRESSION L4-5, LEFT L3-4 MICRODISCECTOMY (N/A Spine Lumbar)     Patient location during evaluation: PACU Anesthesia Type: General Level of consciousness: awake and alert, patient cooperative and oriented Pain management: pain level controlled Vital Signs Assessment: post-procedure vital signs reviewed and stable Respiratory status: spontaneous breathing, nonlabored ventilation, respiratory function stable and patient connected to nasal cannula oxygen Cardiovascular status: blood pressure returned to baseline and stable Postop Assessment: no apparent nausea or vomiting Anesthetic complications: no    Last Vitals:  Vitals:   04/11/19 1415 04/11/19 1455  BP: (!) 141/98 (!) 130/99  Pulse: (!) 106 95  Resp: 16 18  Temp:  36.6 C  SpO2: 98% 100%    Last Pain:  Vitals:   04/11/19 1455  TempSrc: Oral  PainSc:                  Ellysia Char,E. Lambros Cerro

## 2019-04-11 NOTE — Progress Notes (Signed)
Orthopedic Tech Progress Note Patient Details:  [name] 10-23-1965 [id number] Called in order to [facility] for an [place] Patient ID: [name], male   DOB: [birth year], 54 y.o.   MRN: [id number]   Janit Pagan 04/11/2019, 3:15 PM

## 2019-04-11 NOTE — Op Note (Signed)
04/11/2019  1:12 PM  PATIENT:  [name]  54 y.o. male  MRN: [id number]  OPERATIVE REPORT  PRE-OPERATIVE DIAGNOSIS:  Left L3-4 HNP, L4-5 HNP/stenosis, back pain and lower extremity radiculopathy  POST-OPERATIVE DIAGNOSIS:  Left L3-4 HNP, L4-5 HNP/stenosis, back pain and lower extremity radiculopathy  PROCEDURE:  Procedure(s): TRANSFORAMINAL LUMBAR INTERBODY FUSION L4-5 WITH PEDICLE SCREWS, RODS, CAGES, LOCAL BONE GRAFT, ALLOGRAFT AND VIVIGEN, CENTRAL DECOMPRESSION L4-5, LEFT L3-4 LATERAL RECESS DECOMPRESSION    SURGEON:  Jessy Oto, MD     ASSISTANT: Benjiman Core, PA-C  (Present throughout the entire procedure and necessary for completion of procedure in a timely manner)     ANESTHESIA:  General,supplemented with local marcaine 0.5% 1:1 exparel 1.3% total 20cc, Dr. Glennon Mac.   COMPLICATIONS:  None.     COMPONENTS:   Implant Name Type Inv. Item Serial No. Manufacturer Lot No. LRB No. Used Action  BONE VIVIGEN FORMABLE 5.4CC - [id number] Bone Implant BONE VIVIGEN FORMABLE 5.4CC [id number] [employer] [place] [employer]  N/A 1 Implanted  BONE [facility] CHIPS 20CC - [id number] Bone Implant BONE CANC CHIPS 20CC [id number] [employer] [place] [employer]  N/A 1 Implanted  INTERBODY SABLE 10X26 7-14 15D - [id number] Miscellaneous INTERBODY SABLE 10X26 7-14 15D  [employer] [id number] N/A 1 Implanted  SCREW CORTICAL VIPER 7X40MM - [id number] Screw SCREW CORTICAL VIPER 7X40MM  [employer]  N/A 2 Implanted  SCREW CORTICAL VIPER 7X45MM - [id number] Screw SCREW CORTICAL VIPER 7X45MM  [employer]  N/A 2 Implanted  SCREW SET SINGLE INNER - [id number] Screw SCREW SET SINGLE INNER  [employer]  N/A 4 Implanted  ROD PRE BENT EXP 40MM - [id number] Rod ROD PRE BENT EXP 40MM  [employer] [place]  N/A 2 Implanted    PROCEDURE:The patient was met in the holding area, and the appropriate lumbar level Left L4-5 TLIF and left L3-4 microdiscectomy identified  and marked with an x and my initials.The patient was then transported to OR. The patient was then placed under general anesthesia without difficulty.The patient received appropriate preoperative antibiotic prophylaxis ancef.  Nursing staff inserted a Foley catheter under sterile conditions. She was then turned to a prone position [place] spine table was used for this case. All pressure points were well padded PAS stocking applied bilateral lower extremity to prevent DVT. Standard prep DuraPrep solution. Draped in the usual manner. Time-out procedure was called and correct .   The incision was at L3-5 and determined using C-arm to mark the inferior L5 pedicles and  an additionally extended up to the L3 spinous process.   Bovie electric cautery was used to control bleeding and carefully dissection was carried down along the lateral aspects of the spinous process of L3 to L5. Cobb then used to carefully elevate the paralumbar muscle and the incision in the midline was carried to to the level of the base of the residual spinous processes. The posterior exposure area extending from the base of the spinous process of L3 to the superior aspect of L5 was carried expose at its edges debrided the muscle attachments using a Leskell. Time-out procedure was called and correct. Skin in the midline between L2 and L5 was then infiltrated with local anesthesia, marcaine 1/2% 1:1 exparel 1.3% total 20 cc used. Incision was then made  extending from L3-L5  through the skin and subcutaneous layers down to the patient's lumbodorsal fascia and spinous processes. The incision then carried sharply excising the supraspinous ligament and  then continuing the lateral aspect of the spinous processes of L3,L4 and L5. Cobb elevator used to carefully elevate the paralumbar muscles off of the posterior elements using electrocautery carefully drilled bleeding and perform dissection of the muscle tissues of the preserving the facet capsule at  the L3-4. Continuing the exposure out laterally to expose the lateral margin of the facet joint line at L4-5 and L3-4. Incision was carried in the midline down to the L5 level area bleeders controlled using electrocautery monopolar electrocautery.    C-arm fluoroscopy was then brought into the field and using C-arm fluoroscopy then a hole made into the medial aspect of the pedicle of L4 using a high speed burr observed in the pedicle using C arm at the 5 oclock position on the left L4 pedicle nerve probe initial entry was determined on fluoroscopy to be good position alignment so that a 4.84m tap was passed to 45 mm within the left L4 pedicle to a depth of nearly 45 mm observed on C-arm fluoroscopy to be beyond the midpoint of the lumbar vertebra and then position alignment within the left L4 pedicle this was then removed and the pedicle channel probed demonstrating patency no sign of rupture the cortex of the pedicle. Tapping with a 4.35 mm screw tap then 5 mm tap then a 6 mm and 754mtap a 7.0 mm x 45 mm screw was reserved for later placement on the left side pedicle at the L4 level. C-arm fluoroscopy was then brought into the field and using C-arm fluoroscopy then a hole made into the posterior medial aspect of the pedicle of right L4 observed in the pedicle using ball tipped nerve hook and hockey stick nerve probe initial entry was determined on fluoroscopy to be good position alignment so that 4.45m17map was then used to tap the right L4 pedicle to a depth of nearly 45 mm observed on C-arm fluoroscopy to be beyond the midpoint of the lumbar vertebra and then position alignment within the right L4 pedicle this was then removed and the pedicle channel probed demonstrating patency no sign of rupture the cortex of the pedicle. Tapping with a 5 mm screw tap then tapping with a 6.0 mm and 7.45mm53mp, a 7.0 mm x 45 mm screw was placeed.C-arm fluoroscopy was then brought into the field and using C-arm fluoroscopy then a  hole made into the posterior and medial aspect of the left pedicle of L5 observed in the pedicle using ball tipped nerve hook and hockey stick nerve probe initial entry was determined on fluoroscopy to be good position alignment so that a 4.35 mm tap was then used to tap the left L4 pedicle to a depth of nearly 40 mm observed on C-arm fluoroscopy to be beyond the posterior one third of the lumbar vertebra and good position alignment within the left L5 pedicle this was then removed and the pedicle channel probed demonstrating patency no sign of rupture the cortex of the pedicle. Tapping with a 4.0 mm screw tap then a 5.0 mm tap then tapping with a 6.0 mm and 7.45mm 1045m a 7.45mm x4m mm screw was saved for later placement on the left side at the L5 level. The pedicle channel of L5 on the right probed demonstrating patency no sign of rupture the cortex of the pedicle.C-arm fluoroscopy was then brought into the field and using C-arm fluoroscopy then a hole made into the posterior and medial aspect of the right pedicle of L5 observed in the  pedicle using ball tipped nerve hook and hockey stick nerve probe initial entry was determined on fluoroscopy to be good position alignment so that a 4.53m tap was then used to tap the right L5 pedicle to a depth of nearly 40 mm observed on C-arm fluoroscopy to be beyond the posterior one third of the lumbar vertebra and good position alignment within the right L5 pedicle this was then removed and the pedicle channel probed demonstrating patency no sign of rupture the cortex of the pedicle. Tapping with a 4.315mscrew tap then up to a 31m42map then 7.0mm81m40 mm screw was placed on the right side at the L5 level. The pedicle channel of L5 on the right probed demonstrating patency no sign of rupture the cortex of the pedicle. Viper screw for fixation of this level was measured as 7.0 mm x 45 mm screw inserted.  Flow Seal was used for hemostasis of the left L4 and L5 pedicle screw  holes.   Spinous processes of  L4 inferior 50%and superior 20% of L5 were then resected down to the base the lamina at each segment.  Leksell rongeur used to resect inferior aspect of the lamina on the left side at the L4 level and partially on the right side at L4 The right medial 40% of the facets of L4-5 were resected in order to decompress the left and right side of the lumbar thecal sac at L4-5 and decompress the bilateral  L4 and L5 neuroforamen. Osteotomes and 2mm 86m 3mm k35misons were used for this portion of the decompression. Similarly the left side decompression was carried out but  Near complete facetectomy was perform on the left at L4-5 to provide for exposure of the left side L4-5 neuroforamen for ease of placement of TLIF (transforaminal lumbar interbody fusion) at the L4 level inferior portions of the lamina and pars were also resected first beginning with the Leksell rongeur and osteotomes and then resecting using 2 and 3 mm Kerrison. Continued laminectomy was carried out resecting the central portions of the lamina of L4 and upper L5 performing foraminotomies on the right side at the L4 and L5 levels. The inferior articular process  L4 was resected on the right side.  A large amount of hypertrophic ligmentum flavum was found impressing on the right lateral recesses at L4-5 and narrowing the respective L4 and L5 neuroforamen.  Loupe magnification and headlight were used during the pedicle screw insertion portion procedure. Then the operating room microscope sterilely draped and brought into the field for the Left L3-4 microdiscectomy and left L4-5 TLIF portion of the case.Soft tissues debrided about the posterior aspect of the L3-4 and L4-5 interspaces. High-speed bur and then used to carefully drill inferior 3 or 4 mm of the right side L3 lamina and on the medial aspect of the right L3 inferior articular process of 3 mm. The superior margin of the L4 lamina then carefully debrided with curette  and a 2 mm kerrison then used to enter the spinal canal over the superior aspect of the L4 lamina resecting bone over the superior aspect and freeing up the attachment of ligamentum flavum here. Ligamentum flavum then debrided with the 2 mm and 3 mm Kerrisons we decompressed the L4 nerve root and the lateral recess right L3-4 decompressed using 2 and 3 mm Kerrisons sizing hypertrophic reflected ligamentum flavum extending superiorly. From was resected off the ventral aspect of the inferior margin of the L3 lamina. Hockey-stick nerve probe could then  be passed out the L3 neuroforamen and the L4 neuroforamen. Venous bleeding encountered. Thrombin-soaked Gelfoam used to control this following this then the sac and the L5 nerve root were mobilized medially and the L3-4 disc examined and found not to be herniated. Irrigation was carried out down to this bleeding controlled with Gelfoam. Gelfoam was then removed. Irrigation carried careful examination demonstrated no active bleeding present. Retractors were then carefully removed Since carefully then the Bleeding was then controlled using thrombin-soaked Gelfoam small cottonoids. Small amount of bleeding within the soft tissue mass the laminotomy area was controlled using bipolar electrocautery. Irrigation was carried out using copious amounts of irrigant solution. All Gelfoam were then removed.  Attention then turned to placement of the transforaminal lumbar interbody fusion cage.  Bleeding controlled using bipolar electrocautery thrombin soaked gel cottonoids. Then turned to the left L4-5 level the exposure the posterior lateral aspect this was carried out using a Penfield 4 bipolar electrocautery to control small bleeders present. Derricho retractor used to retract the thecal sac and L4 nerve root a 15 blade scalpel was used to incise posterior lateral aspect of the left L4-5 disc the disc space at this level showed a rather severe narrowing posteriorly was more open  anteriorly so that an osteotome again was used to resect a small portion the posterior superior lip of the vertebral body at L5  in order to gain ease of access into the L4-5 disc space. The space was debrided of degenerative disc material using pituitary along root the entire disc space was then debrided of degenerative disc material using pituitary rongeurs curettage down to bleeding bone endplates. 19m and 9 mm shavers were used to debride the disc space and pituitary ronguers used to remove the loosened debris. This space was then carefully assess using spacers  a 10.020mtrial cage provided the best fit, the Globus sable cage 47m36m 247m61m chosen so that the permanent Sable LIFT 7.0 mm cage by 27 mm concorde cage packed with local bone graft and vivigen and cancellous allograft chips were placed into the intervertebral disc space. The posterior intervertebral disc space was then packed with autogenous local bone graft that been harvested from the central laminectomy and vivigen bone graft, allograft. Bleeding controlled using bipolar electrocautery.  Observed on C-arm fluoroscopy to be in good position alignment. The cage at L4-5 was placed anteriorly as best as possible the correct patient's lordosis. The cage was then raised with the insertion handle deployment and the LIFT mechanism deployed. The cage was then filled with vivigen bone graft. With this then the transforaminal lumbar interbody fusion portion of the case was completed bleeders were controlled using bipolar electrocautery thrombin-soaked Gelfoam were appropriate.Decortication of the right facet joint carried out at L4-5. These were packed with cancellous local bone graft.  The 2 viper corticofixation screws on the left were each placed  and then each fastener carefully aligned  to allow for placement of rods. The left rod was a precontoured 40 mm rod. This was then placed into the pedicle screws on the left extending from L4-L5 each of the caps  were carefully placed loosely tightened.Caps onto the left L4 fastener was tightened to 80 foot lbs. Across the right side a precontoured 40 mm titanium rod was placed into the L4 and L5  screw fasteners and the upper cap tightened to 80 foot lbs., compression was obtained on the right side between L5 and L4 compressing between the fasteners and tightening the screw caps 85 pounds.Copious  amounts of saline solution this was done throughout the case. Cell Saver was used during the case. No cell saver blood returned to the patient.  Hockey stick neuroprobe was used to probe the neuroforamen bilateral L4 and L5 these were determined to be well decompressed. Permanent C-arm images were obtained in AP and lateral plane and oblique planes. Remaining local bone graft was then applied along both lateral posterior lateral region extending from right L4 to L5 facet bed.Gelfoam was then removed spinal canal. The lumbodorsal musculature carefully exam debrided of any devitalized tissue following removal of self retaining retractors were the bleeders were controlled using electrocautery and the area dorsal lumbar muscle were then approximated in the midline with interrupted #1 Vicryl sutures loose the dorsal fascia was reattached to the spinous process of L3  superiorly and L5  inferiorly this was done with #1 Vicryl sutures. Subcutaneous layers then approximated using interrupted 0 Vicryl sutures and 2-0 Vicryl sutures. Skin was closed with a running subcutaneous stitch of 4-0 Vicryl Dermabond was applied then MedPlex bandage. All instrument and sponge counts were correct. The patient was then returned to a supine position on her bed reactivated extubated and returned to the recovery room in satisfactory condition.    Benjiman Core PA-C perform the duties of assistant surgeon during this case. He was present from the beginning of the case to the end of the case assisting in transfer the patient from his stretcher to the OR  table and back to the stretcher at the end of the case. Assisted in careful retraction and suction of the laminectomy site delicate neural structures operating under the operating room microscope. He performed closure of the incision from the fascia to the skin applying the dressing.     @ 04/11/2019,1:12 PM

## 2019-04-11 NOTE — Transfer of Care (Signed)
Immediate Anesthesia Transfer of Care Note  Patient: [name]  Procedure(s) Performed: TRANSFORAMINAL LUMBAR INTERBODY FUSION L4-5 WITH PEDICLE SCREWS, RODS, CAGES, LOCAL BONE GRAFT, ALLOGRAFT AND VIVIGEN, CENTRAL DECOMPRESSION L4-5, LEFT L3-4 MICRODISCECTOMY (N/A Spine Lumbar)  Patient Location: PACU  Anesthesia Type:General  Level of Consciousness: awake, alert , oriented and sedated  Airway & Oxygen Therapy: Patient Spontanous Breathing and Patient connected to face mask oxygen  Post-op Assessment: Report given to RN, Post -op Vital signs reviewed and unstable, Anesthesiologist notified and Patient moving all extremities  Post vital signs: Reviewed and stable  Last Vitals:  Vitals Value Taken Time  BP 105/69 04/11/19 1312  Temp    Pulse 111 04/11/19 1316  Resp 22 04/11/19 1316  SpO2 100 % 04/11/19 1316  Vitals shown include unvalidated device data.  Last Pain:  Vitals:   04/11/19 0647  TempSrc:   PainSc: 8       Patients Stated Pain Goal: 3 ([id number])  Complications: No apparent anesthesia complications

## 2019-04-11 NOTE — Interval H&P Note (Signed)
History and Physical Interval Note:  04/11/2019 7:29 AM  [name]  has presented today for surgery, with the diagnosis of L4-5 severe spinal stenosis, L4-5 spondylolisthesis, left L3-4 herniated disc.  The various methods of treatment have been discussed with the patient and family. After consideration of risks, benefits and other options for treatment, the patient has consented to  Procedure(s): TRANSFORAMINAL LUMBAR INTERBODY FUSION L4-5 WITH PEDICLE SCREWS, RODS, CAGES, LOCAL BONE GRAFT, ALLOGRAFT AND VIVIGEN, CENTRAL DECOMPRESSION L4-5, LEFT L3-4 MICRODISCECTOMY (N/A) as a surgical intervention.  The patient's history has been reviewed, patient examined, no change in status, stable for surgery.  I have reviewed the patient's chart and labs.  Questions were answered to the patient's satisfaction.     Basil Dess

## 2019-04-11 NOTE — Progress Notes (Signed)
Patient Hypertensive: Patient hypertensive as below. Patient states he did not take his BP meds this morning. Dr. Glennon Mac notified. Verbal order received to give patient 2.5mg  Amlodipine tab PO.    04/11/19 0601 04/11/19 0656  Vitals  Pulse Rate Source Monitor Monitor  BP (!) 153/105 (!) 145/102

## 2019-04-11 NOTE — Anesthesia Procedure Notes (Signed)
Procedure Name: Intubation Date/Time: 04/11/2019 7:49 AM Performed by: Scheryl Darter, CRNA Pre-anesthesia Checklist: Patient identified, Emergency Drugs available, Suction available and Patient being monitored Patient Re-evaluated:Patient Re-evaluated prior to induction Oxygen Delivery Method: Circle System Utilized Preoxygenation: Pre-oxygenation with 100% oxygen Induction Type: IV induction Ventilation: Mask ventilation without difficulty Laryngoscope Size: Mac and 4 Grade View: Grade I Tube type: Oral Tube size: 7.5 mm Number of attempts: 1 Airway Equipment and Method: Stylet and Oral airway Placement Confirmation: ETT inserted through vocal cords under direct vision,  positive ETCO2 and breath sounds checked- equal and bilateral Secured at: 24 cm Tube secured with: Tape Dental Injury: Teeth and Oropharynx as per pre-operative assessment

## 2019-04-11 NOTE — Brief Op Note (Signed)
04/11/2019  12:50 PM  PATIENT:  [name]  54 y.o. male  PRE-OPERATIVE DIAGNOSIS:  Left L3-4 HNP, L4-5 HNP/stenosis, back pain and lower extremity radiculopathy  POST-OPERATIVE DIAGNOSIS:  Left L3-4 HNP, L4-5 HNP/stenosis, back pain and lower extremity radiculopathy  PROCEDURE:  Procedure(s): TRANSFORAMINAL LUMBAR INTERBODY FUSION L4-5 WITH PEDICLE SCREWS, RODS, CAGES, LOCAL BONE GRAFT, ALLOGRAFT AND VIVIGEN, CENTRAL DECOMPRESSION L4-5, LEFT L3-4 MICRODISCECTOMY (N/A)  SURGEON:  Surgeon(s) and Role:    * Jessy Oto, MD - Primary  PHYSICIAN ASSISTANT: Benjiman Core, PA-C  ANESTHESIA:   local and general, Dr. Glennon Mac  EBL:  300 mL   BLOOD ADMINISTERED:none  DRAINS: Urinary Catheter (Foley)   LOCAL MEDICATIONS USED:  MARCAINE 0.5% 1:1 EXPAREL 1.3% Amount: 20 ml  SPECIMEN:  No Specimen  DISPOSITION OF SPECIMEN:  N/A  COUNTS:  YES  TOURNIQUET:  * No tourniquets in log *  DICTATION: .Dragon Dictation  PLAN OF CARE: Admit to inpatient   PATIENT DISPOSITION:  PACU - hemodynamically stable.   Delay start of Pharmacological VTE agent (>24hrs) due to surgical blood loss or risk of bleeding: yes

## 2019-04-12 LAB — BASIC METABOLIC PANEL
Anion gap: 5 (ref 5–15)
BUN: 5 mg/dL — ABNORMAL LOW (ref 6–20)
CO2: 28 mmol/L (ref 22–32)
Calcium: 8.3 mg/dL — ABNORMAL LOW (ref 8.9–10.3)
Chloride: 104 mmol/L (ref 98–111)
Creatinine, Ser: 0.91 mg/dL (ref 0.61–1.24)
GFR calc Af Amer: >60 mL/min (ref >60)
GFR calc non Af Amer: >60 mL/min (ref >60)
Glucose, Bld: 144 mg/dL — ABNORMAL HIGH (ref 70–99)
Potassium: 3.6 mmol/L (ref 3.5–5.1)
Sodium: 137 mmol/L (ref 135–145)

## 2019-04-12 LAB — CBC
HCT: 29.5 % — ABNORMAL LOW (ref 39.0–52.0)
Hemoglobin: 9.5 g/dL — ABNORMAL LOW (ref 13.0–17.0)
MCH: 28.4 pg (ref 26.0–34.0)
MCHC: 32.2 g/dL (ref 30.0–36.0)
MCV: 88.3 fL (ref 80.0–100.0)
Platelets: 259 10*3/uL (ref 150–400)
RBC: 3.34 MIL/uL — ABNORMAL LOW (ref 4.22–5.81)
RDW: 15 % (ref 11.5–15.5)
WBC: 8.8 10*3/uL (ref 4.0–10.5)
nRBC: 0 % (ref 0.0–0.2)

## 2019-04-12 MED ORDER — METOCLOPRAMIDE HCL 5 MG PO TABS
5.0000 mg | ORAL_TABLET | Freq: Three times a day (TID) | ORAL | Status: DC
  Administered 2019-04-12 – 2019-04-13 (×3): 5 mg via ORAL
  Filled 2019-04-12 (×3): qty 1

## 2019-04-12 MED ORDER — SIMETHICONE 80 MG PO CHEW
80.0000 mg | CHEWABLE_TABLET | Freq: Four times a day (QID) | ORAL | Status: DC | PRN
  Administered 2019-04-12: 80 mg via ORAL
  Filled 2019-04-12 (×2): qty 1

## 2019-04-12 NOTE — Evaluation (Signed)
Physical Therapy Evaluation Patient Details Name: [name] MRN: [id number] DOB: September 22, 1965 Today's Date: 04/12/2019   History of Present Illness  Pt is a 54 yo male s/p TLIF L4-5, central decompression L4-5, and left L3-4 microdiscectomy. PMHx: COPD, HTN, anxiety, depression, L ankle fusion.    Clinical Impression  Pt admitted with above diagnosis. At the time of PT eval, pt was able to demonstrate transfers and ambulation with gross min guard assist to min assist with RW for support. Pt was educated on precautions, brace application/wearing schedule, appropriate activity progression, and car transfer. Will need reinforcement of education prior to discharge due to poor carryover. Pt currently with functional limitations due to the deficits listed below (see PT Problem List). Pt will benefit from skilled PT to increase their independence and safety with mobility to allow discharge to the venue listed below.     Follow Up Recommendations No PT follow up;Supervision/Assistance - 24 hour    Equipment Recommendations  None recommended by PT    Recommendations for Other Services       Precautions / Restrictions Precautions Precautions: Back;Fall Precaution Booklet Issued: Yes (comment) Precaution Comments: Verbal discussion. Unable state precautions without cues. Required Braces or Orthoses: Spinal Brace Spinal Brace: Lumbar corset;Applied in standing position;Other (comment)(requiring assist) Spinal Brace Comments: Pt requiring assist with donning/doffing brace. Restrictions Weight Bearing Restrictions: No      Mobility  Bed Mobility Overal bed mobility: Needs Assistance Bed Mobility: Sit to Supine       Sit to supine: Supervision   General bed mobility comments: Cues for log rolling but overall able to complete without assistance.   Transfers Overall transfer level: Needs assistance Equipment used: Rolling walker (2 wheeled) Transfers: Sit to/from [employer] Sit to Stand: Min guard         General transfer comment: hands-on guarding provided for safety. VC's for hand placement on seated surface for safety.   Ambulation/Gait Ambulation/Gait assistance: Min guard Gait Distance (Feet): 200 Feet Assistive device: Rolling walker (2 wheeled) Gait Pattern/deviations: Step-through pattern;Decreased stride length;Trunk flexed Gait velocity: Decreased Gait velocity interpretation: <1.31 ft/sec, indicative of household ambulator General Gait Details: VC's for improved posture, closer walker proximity, and forward gaze. Pt with difficulty achieving upright posture reportedly due to pain, and frequently returned to pushing walker out very far in front of him.   Stairs            Wheelchair Mobility    Modified Rankin (Stroke Patients Only)       Balance Overall balance assessment: Needs assistance Sitting-balance support: Feet supported;No upper extremity supported Sitting balance-[name]: Fair     Standing balance support: Bilateral upper extremity supported;During functional activity Standing balance-[name]: Poor Standing balance comment: Reliant on UE support                             Pertinent Vitals/Pain Pain Assessment: 0-10 Pain Score: 10-Worst pain ever Pain Location: low back Pain Descriptors / Indicators: Discomfort;Sore Pain Intervention(s): Limited activity within patient's tolerance;Monitored during session;Repositioned    Home Living Family/patient expects to be discharged to:: Private residence Living Arrangements: Spouse/significant other Available Help at Discharge: Family;Available 24 hours/day Type of Home: Apartment Home Access: Level entry     Home Layout: One level Home Equipment: Cane - single point Additional Comments: Pt has access to rollator and RW, but pt does not own his own    Prior Function Level of Independence:  Needs assistance   Gait / Transfers  Assistance Needed: SPC for mobility  ADL's / Homemaking Assistance Needed: Pt requiring assist for LB ADL and unable to step in/out of shower.        Hand Dominance   Dominant Hand: Right    Extremity/Trunk Assessment   Upper Extremity Assessment Upper Extremity Assessment: Overall WFL for tasks assessed    Lower Extremity Assessment Lower Extremity Assessment: Generalized weakness    Cervical / Trunk Assessment Cervical / Trunk Assessment: Other exceptions Cervical / Trunk Exceptions: s/p lumbar sx  Communication   Communication: No difficulties  Cognition Arousal/Alertness: Awake/alert Behavior During Therapy: WFL for tasks assessed/performed Overall Cognitive Status: Impaired/Different from baseline Area of Impairment: Safety/judgement;Awareness                         Safety/Judgement: Decreased awareness of deficits;Decreased awareness of safety Awareness: Emergent   General Comments: Pt slightly impulsive and poor balance at times; requires cues to slow down.      General Comments      Exercises     Assessment/Plan    PT Assessment Patient needs continued PT services  PT Problem List Decreased strength;Decreased activity tolerance;Decreased balance;Decreased mobility;Decreased knowledge of use of DME;Decreased safety awareness;Decreased knowledge of precautions;Pain       PT Treatment Interventions DME instruction;Functional mobility training;Gait training;Therapeutic activities;Therapeutic exercise;Neuromuscular re-education;Patient/family education    PT Goals (Current goals can be found in the Care Plan section)  Acute Rehab PT Goals Patient Stated Goal: to go home, decrease pain PT Goal Formulation: With patient Time For Goal Achievement: 04/19/19 Potential to Achieve Goals: Good    Frequency Min 5X/week   Barriers to discharge        Co-evaluation               AM-PAC PT "6 Clicks" Mobility  Outcome Measure Help needed  turning from your back to your side while in a flat bed without using bedrails?: None Help needed moving from lying on your back to sitting on the side of a flat bed without using bedrails?: A Little Help needed moving to and from a bed to a chair (including a wheelchair)?: A Little Help needed standing up from a chair using your arms (e.g., wheelchair or bedside chair)?: A Little Help needed to walk in hospital room?: A Little Help needed climbing 3-5 steps with a railing? : A Little 6 Click Score: 19    End of Session Equipment Utilized During Treatment: Gait belt Activity Tolerance: Patient tolerated treatment well Patient left: in bed;with call bell/phone within reach Nurse Communication: Mobility status PT Visit Diagnosis: Unsteadiness on feet (R26.81);Pain Pain - part of body: (back)    Time: 0939-1000 PT Time Calculation (min) (ACUTE ONLY): 21 min   Charges:   PT Evaluation $PT Eval Moderate Complexity: 1 Mod          Conni Slipper, PT, DPT Acute Rehabilitation Services Pager: [phone] Office: [phone]   Marylynn Pearson 04/12/2019, 12:15 PM

## 2019-04-12 NOTE — Progress Notes (Signed)
Subjective: 1 Day Post-Op Procedure(s) (LRB): TRANSFORAMINAL LUMBAR INTERBODY FUSION L4-5 WITH PEDICLE SCREWS, RODS, CAGES, LOCAL BONE GRAFT, ALLOGRAFT AND VIVIGEN, CENTRAL DECOMPRESSION L4-5, LEFT L3-4 MICRODISCECTOMY (N/A) Awake, alert and oriented x 4. Has been up 1- 2 times walking short distanced. Abdomenal distention. Pain is moderate.  Patient reports pain as moderate.    Objective:   VITALS:  Temp:  [97.7 F (36.5 C)-100.5 F (38.1 C)] 100.5 F (38.1 C) (01/13 0725) Pulse Rate:  [95-122] 98 (01/13 0730) Resp:  [16-20] 18 (01/13 0725) BP: (122-150)/(81-108) 122/89 (01/13 0725) SpO2:  [94 %-100 %] 97 % (01/13 0725)  Neurologically intact ABD soft Neurovascular intact Sensation intact distally Intact pulses distally Dorsiflexion/Plantar flexion intact Incision: scant drainage Compartment soft   LABS Recent Labs    04/12/19 0356  HGB 9.5*  WBC 8.8  PLT 259   Recent Labs    04/12/19 0356  NA 137  K 3.6  CL 104  CO2 28  BUN 5*  CREATININE 0.91  GLUCOSE 144*   No results for input(s): LABPT, INR in the last 72 hours.   Assessment/Plan: 1 Day Post-Op Procedure(s) (LRB): TRANSFORAMINAL LUMBAR INTERBODY FUSION L4-5 WITH PEDICLE SCREWS, RODS, CAGES, LOCAL BONE GRAFT, ALLOGRAFT AND VIVIGEN, CENTRAL DECOMPRESSION L4-5, LEFT L3-4 MICRODISCECTOMY (N/A)  Advance diet Up with therapy D/C IV fluids Plan for discharge tomorrow  Vira Browns 04/12/2019, 12:20 PMPatient ID: [name], male   DOB: 02/17/1966, 54 y.o.   MRN: [id number]

## 2019-04-12 NOTE — Evaluation (Signed)
Occupational Therapy Evaluation Patient Details Name: [name] MRN: [id number] DOB: April 09, 1965 Today's Date: 04/12/2019    History of Present Illness Pt is a 54 yo male s/p TLIF L4-5, central decompression L4-5, and left L3-4 microdiscectomy. PMHx: COPD, HTN, anxiety, depression, L ankle fusion.   Clinical Impression   Pt PTA: Pt living at home with s/o and wil have 24/7. Pt was requiring assist with mobility and ADL due to LE pain and back pain. Pt currently performing LB dressing with figure 4 technique. Pt with reacher at home so education provided for AE. Pt requiring assist to donn brace as pt was impulsive and requiring cues for proper sequencing. Pt performing ADL functional mobility with RW and pt requiring assist for stability. Pt practicing walk in shower transfer and advised to assist for first time in/out of shower. Pt does not require continued OT skilled services. OT signing off. Pt requires BSC for low commodes at home.      Follow Up Recommendations  No OT follow up;Supervision/Assistance - 24 hour    Equipment Recommendations  3 in 1 bedside commode    Recommendations for Other Services       Precautions / Restrictions Precautions Precautions: Back;Fall Precaution Booklet Issued: Yes (comment) Precaution Comments: Verbal discussion. Unable state precautions without cues. Required Braces or Orthoses: Spinal Brace Spinal Brace: Lumbar corset;Applied in standing position;Other (comment)(requiring assist) Spinal Brace Comments: Pt requiring assist with donning/doffing brace. Restrictions Weight Bearing Restrictions: No      Mobility Bed Mobility Overal bed mobility: Needs Assistance Bed Mobility: Sit to Supine       Sit to supine: Supervision   General bed mobility comments: Cues for log rolling  Transfers Overall transfer level: Needs assistance Equipment used: Rolling walker (2 wheeled) Transfers: Sit to/from [employer] Transfers Sit to  Stand: Min guard Stand pivot transfers: Min guard       General transfer comment: [email or web address] for stability    Balance Overall balance assessment: Mild deficits observed, not formally tested                                         ADL either performed or assessed with clinical judgement   ADL Overall ADL's : Needs assistance/impaired Eating/Feeding: Set up;Sitting   Grooming: Set up;Sitting;Min guard;Standing   Upper Body Bathing: Set up;Sitting   Lower Body Bathing: Min guard;Minimal assistance;Sitting/lateral leans;Sit to/from stand   Upper Body Dressing : Minimal assistance;Sitting Upper Body Dressing Details (indicate cue type and reason): Assist to donn brace Lower Body Dressing: Min guard;Minimal assistance;Cueing for safety Lower Body Dressing Details (indicate cue type and reason): Figure 4 technique for LB dressing Toilet Transfer: Min guard;BSC;RW   Toileting- Clothing Manipulation and Hygiene: Min guard;Minimal assistance;Sitting/lateral lean;Sit to/from stand;Cueing for safety   Tub/ Shower Transfer: Minimal assistance;Stand-pivot;Cueing for safety;Cueing for sequencing;Shower seat   Functional mobility during ADLs: Min guard;Rolling walker;Cueing for safety;Cueing for sequencing General ADL Comments: Pt limited by pain and decreased activity tolerance.     Vision Baseline Vision/History: No visual deficits Patient Visual Report: No change from baseline Vision Assessment?: No apparent visual deficits     Perception     Praxis      Pertinent Vitals/Pain Pain Assessment: 0-10 Pain Score: 4  Pain Location: low back Pain Descriptors / Indicators: Discomfort;Sore Pain Intervention(s): Monitored during session;Repositioned     Hand Dominance Right   Extremity/Trunk Assessment Upper  Extremity Assessment Upper Extremity Assessment: Overall WFL for tasks assessed   Lower Extremity Assessment Lower Extremity Assessment: Generalized  weakness;Defer to PT evaluation   Cervical / Trunk Assessment Cervical / Trunk Assessment: Other exceptions Cervical / Trunk Exceptions: s/p lumbar sx   Communication Communication Communication: No difficulties   Cognition Arousal/Alertness: Awake/alert Behavior During Therapy: WFL for tasks assessed/performed Overall Cognitive Status: Impaired/Different from baseline Area of Impairment: Safety/judgement;Awareness                         Safety/Judgement: Decreased awareness of deficits;Decreased awareness of safety Awareness: Intellectual   General Comments: Pt slightly impulsive and poor balance at times; requires cues to slow down.   General Comments       Exercises     Shoulder Instructions      Home Living Family/patient expects to be discharged to:: Private residence Living Arrangements: Spouse/significant other Available Help at Discharge: Family;Available 24 hours/day Type of Home: Apartment Home Access: Level entry     Home Layout: One level     Bathroom Shower/Tub: Chief Strategy Officer: Standard     Home Equipment: Cane - single point   Additional Comments: Pt has access to rollator and RW, but pt does not own his own      Prior Functioning/Environment Level of Independence: Needs assistance  Gait / Transfers Assistance Needed: SPC for mobility ADL's / Homemaking Assistance Needed: Pt requiring assist for LB ADL and unable to step in/out of shower.            OT Problem List: Decreased activity tolerance;Decreased strength      OT Treatment/Interventions:      OT Goals(Current goals can be found in the care plan section) Acute Rehab OT Goals Patient Stated Goal: to go home OT Goal Formulation: With patient  OT Frequency:     Barriers to D/C:            Co-evaluation              AM-PAC OT "6 Clicks" Daily Activity     Outcome Measure Help from another person eating meals?: None Help from another  person taking care of personal grooming?: None Help from another person toileting, which includes using toliet, bedpan, or urinal?: A Little Help from another person bathing (including washing, rinsing, drying)?: A Little Help from another person to put on and taking off regular upper body clothing?: None Help from another person to put on and taking off regular lower body clothing?: A Little 6 Click Score: 21   End of Session Equipment Utilized During Treatment: Rolling walker;Back brace Nurse Communication: Mobility status  Activity Tolerance: Patient tolerated treatment well;Patient limited by pain Patient left: in bed;with call bell/phone within reach  OT Visit Diagnosis: Unsteadiness on feet (R26.81);Muscle weakness (generalized) (M62.81);Pain Pain - part of body: (back)                Time: 4098-1191 OT Time Calculation (min): 33 min Charges:  OT General Charges $OT Visit: 1 Visit OT Evaluation $OT Eval Moderate Complexity: 1 Mod OT Treatments $Self Care/Home Management : 8-22 mins  Flora Lipps OTR/L Acute Rehabilitation Services Pager: [phone] Office: [phone]   Rashun Grattan C 04/12/2019, 8:42 AM

## 2019-04-13 ENCOUNTER — Telehealth: Payer: Self-pay | Admitting: Specialist

## 2019-04-13 ENCOUNTER — Other Ambulatory Visit: Payer: Self-pay | Admitting: Physician Assistant

## 2019-04-13 LAB — CBC WITH DIFFERENTIAL/PLATELET
Abs Immature Granulocytes: 0.08 10*3/uL — ABNORMAL HIGH (ref 0.00–0.07)
Basophils Absolute: 0 10*3/uL (ref 0.0–0.1)
Basophils Relative: 0 %
Eosinophils Absolute: 0 10*3/uL (ref 0.0–0.5)
Eosinophils Relative: 0 %
HCT: 31.9 % — ABNORMAL LOW (ref 39.0–52.0)
Hemoglobin: 10.2 g/dL — ABNORMAL LOW (ref 13.0–17.0)
Immature Granulocytes: 1 %
Lymphocytes Relative: 8 %
Lymphs Abs: 1.1 10*3/uL (ref 0.7–4.0)
MCH: 28.7 pg (ref 26.0–34.0)
MCHC: 32 g/dL (ref 30.0–36.0)
MCV: 89.6 fL (ref 80.0–100.0)
Monocytes Absolute: 0.8 10*3/uL (ref 0.1–1.0)
Monocytes Relative: 6 %
Neutro Abs: 11.9 10*3/uL — ABNORMAL HIGH (ref 1.7–7.7)
Neutrophils Relative %: 85 %
Platelets: 303 10*3/uL (ref 150–400)
RBC: 3.56 MIL/uL — ABNORMAL LOW (ref 4.22–5.81)
RDW: 14.7 % (ref 11.5–15.5)
WBC: 13.9 10*3/uL — ABNORMAL HIGH (ref 4.0–10.5)
nRBC: 0 % (ref 0.0–0.2)

## 2019-04-13 LAB — BASIC METABOLIC PANEL
Anion gap: 10 (ref 5–15)
BUN: 8 mg/dL (ref 6–20)
CO2: 28 mmol/L (ref 22–32)
Calcium: 9 mg/dL (ref 8.9–10.3)
Chloride: 102 mmol/L (ref 98–111)
Creatinine, Ser: 0.83 mg/dL (ref 0.61–1.24)
GFR calc Af Amer: >60 mL/min (ref >60)
GFR calc non Af Amer: >60 mL/min (ref >60)
Glucose, Bld: 158 mg/dL — ABNORMAL HIGH (ref 70–99)
Potassium: 3.8 mmol/L (ref 3.5–5.1)
Sodium: 140 mmol/L (ref 135–145)

## 2019-04-13 MED ORDER — OXYCODONE HCL 5 MG PO TABS: 5.0000 mg | ORAL_TABLET | ORAL | 0 refills | Status: DC | PRN

## 2019-04-13 MED ORDER — METHOCARBAMOL 500 MG PO TABS: 500.0000 mg | ORAL_TABLET | Freq: Four times a day (QID) | ORAL | 1 refills | Status: DC | PRN

## 2019-04-13 MED ORDER — OMEPRAZOLE 40 MG PO CPDR: DELAYED_RELEASE_CAPSULE | ORAL | 3 refills | Status: DC

## 2019-04-13 MED ORDER — GABAPENTIN 300 MG PO CAPS: 300.0000 mg | ORAL_CAPSULE | Freq: Two times a day (BID) | ORAL | 2 refills | Status: DC

## 2019-04-13 MED ORDER — OXYCODONE HCL ER 15 MG PO T12A: 15.0000 mg | EXTENDED_RELEASE_TABLET | Freq: Two times a day (BID) | ORAL | 0 refills | Status: DC

## 2019-04-13 MED ORDER — DOCUSATE SODIUM 100 MG PO CAPS: 100.0000 mg | ORAL_CAPSULE | Freq: Two times a day (BID) | ORAL | 0 refills | Status: DC

## 2019-04-13 NOTE — Telephone Encounter (Signed)
noted 

## 2019-04-13 NOTE — Progress Notes (Signed)
[facility] transportation scheduled for patient as his significant other cannot afford an uber to pick him up.   Percell Locus Joanna Borawski LCSW [phone]

## 2019-04-13 NOTE — Progress Notes (Signed)
Patient is discharged from room 3C04 at this time. Alert and in stable condition. Instructions read to patient and understanding verbalized. Left unit via wheelchair with all belongings at side. Went home via [place] transportation by [facility].

## 2019-04-13 NOTE — Telephone Encounter (Signed)
I have submitted on [place] tracks for medicaid approval, and on cover my meds

## 2019-04-13 NOTE — Telephone Encounter (Signed)
Patient's girlfriend [name] called asked if she can get a call back on the cell phone number because the house number is not working at this time. The number to contact [name] or patient is [phone]   Please see previous note

## 2019-04-13 NOTE — Progress Notes (Signed)
Subjective: 2 Days Post-Op Procedure(s) (LRB): TRANSFORAMINAL LUMBAR INTERBODY FUSION L4-5 WITH PEDICLE SCREWS, RODS, CAGES, LOCAL BONE GRAFT, ALLOGRAFT AND VIVIGEN, CENTRAL DECOMPRESSION L4-5, LEFT L3-4 MICRODISCECTOMY (N/A) Awake, alert and oriented x 4. I just came back from walking the hallway. No complaints. IS for low grade fever yesterday afebrile today. Abdomen is less distended.   Patient reports pain as moderate.    Objective:   VITALS:  Temp:  [98 F (36.7 C)-99.2 F (37.3 C)] 98.2 F (36.8 C) (01/14 0759) Pulse Rate:  [97-128] 112 (01/14 0759) Resp:  [18-20] 18 (01/14 0759) BP: (119-151)/(86-94) 138/94 (01/14 0759) SpO2:  [95 %-100 %] 95 % (01/14 0759)  Neurologically intact ABD soft Neurovascular intact Sensation intact distally Intact pulses distally Dorsiflexion/Plantar flexion intact Incision: no drainage Compartment soft   LABS Recent Labs    04/12/19 0356 04/13/19 0723  HGB 9.5* 10.2*  WBC 8.8 13.9*  PLT 259 303   Recent Labs    04/12/19 0356 04/13/19 0723  NA 137 140  K 3.6 3.8  CL 104 102  CO2 28 28  BUN 5* 8  CREATININE 0.91 0.83  GLUCOSE 144* 158*   No results for input(s): LABPT, INR in the last 72 hours.   Assessment/Plan: 2 Days Post-Op Procedure(s) (LRB): TRANSFORAMINAL LUMBAR INTERBODY FUSION L4-5 WITH PEDICLE SCREWS, RODS, CAGES, LOCAL BONE GRAFT, ALLOGRAFT AND VIVIGEN, CENTRAL DECOMPRESSION L4-5, LEFT L3-4 MICRODISCECTOMY (N/A)  Advance diet Up with therapy D/C IV fluids Discharge home with home health  Vira Browns 04/13/2019, 8:42 AMPatient ID: [name], male   DOB: July 22, 1965, 54 y.o.   MRN: [id number]

## 2019-04-13 NOTE — TOC Transition Note (Signed)
Transition of Care [facility]) - CM/SW Discharge Note   Patient Details  Name: [name] MRN: [id number] Date of Birth: 01/23/1966  [facility] CM/SW Contact:  Carles Collet, RN Phone Number: 04/13/2019, 9:44 AM   Clinical Narrative:    [name] w patient over the phone. Reviewed Medicare [place] ratings. Chose [facility]. Referral accepted. Floor nurse to provide DME from unit closet. No other CM needs.     Final next level of care: [place] Barriers to Discharge: No Barriers Identified   Patient Goals and CMS Choice Patient states their goals for this hospitalization and ongoing recovery are:: to go home CMS Medicare.gov Compare Post Acute Care list provided to:: Patient Choice offered to / list presented to : Patient  Discharge Placement                       Discharge Plan and Services                          HH Arranged: PT [facility] Agency: [facility] (formerly [employer]) Date [place]: 04/13/19 Time [place]: [phone] Representative spoke with at [place]: [place] ([place]) Interventions     Readmission Risk Interventions No flowsheet data found.

## 2019-04-13 NOTE — Telephone Encounter (Signed)
Patient's girlfriend called and requesting new pain medication prescription. [name] states that [employer] will not pay for oxycodone. Patient asking for cheaper pain medication insurance will cover. Patient pharmacy is [facility] Patient phone number is [phone]

## 2019-04-13 NOTE — Progress Notes (Signed)
Physical Therapy Treatment Patient Details Name: [name] MRN: [id number] DOB: Aug 11, 1965 Today's Date: 04/13/2019    History of Present Illness Pt is a 54 yo male s/p TLIF L4-5, central decompression L4-5, and left L3-4 microdiscectomy. PMHx: COPD, HTN, anxiety, depression, L ankle fusion.    PT Comments    Pt progressing well with post-op mobility. He was able to demonstrate transfers and ambulation with gross min guard assist to supervision for safety with RW for support. Overall with poor carryover from evaluation and continues to require cues for safety and maintenance of precautions. Pt was educated on precautions, brace application/wearing schedule, appropriate activity progression, and car transfer. Will continue to follow.      Follow Up Recommendations  No PT follow up;Supervision/Assistance - 24 hour     Equipment Recommendations  None recommended by PT    Recommendations for Other Services       Precautions / Restrictions Precautions Precautions: Back;Fall Precaution Booklet Issued: Yes (comment) Precaution Comments: Reviewed verbally during functional mobility. Poor carryover from yesterday's session.  Required Braces or Orthoses: Spinal Brace Spinal Brace: Lumbar corset;Applied in standing position Restrictions Weight Bearing Restrictions: No    Mobility  Bed Mobility Overal bed mobility: Needs Assistance Bed Mobility: Rolling;Sidelying to Sit;Sit to Supine Rolling: Modified independent (Device/Increase time) Sidelying to sit: Modified independent (Device/Increase time)   Sit to supine: Supervision   General bed mobility comments: Cues for log roll technique. Pt impulsively returned to supine without log roll despite cues to come to sidelying first.   Transfers Overall transfer level: Needs assistance Equipment used: Rolling walker (2 wheeled) Transfers: Sit to/from [employer] Sit to Stand: Supervision         General transfer  comment: Light supervision provided for safety. VC's for hand placement on seated surface for safety.   Ambulation/Gait Ambulation/Gait assistance: Min guard Gait Distance (Feet): 250 Feet Assistive device: Rolling walker (2 wheeled) Gait Pattern/deviations: Step-through pattern;Decreased stride length;Trunk flexed Gait velocity: Decreased Gait velocity interpretation: <1.8 ft/sec, indicate of risk for recurrent falls General Gait Details: VC's for improved posture, closer walker proximity, and forward gaze. Pt with difficulty achieving upright posture reportedly due to pain, and frequently returned to pushing walker out very far in front of him.    Stairs             Wheelchair Mobility    Modified Rankin (Stroke Patients Only)       Balance Overall balance assessment: Needs assistance Sitting-balance support: Feet supported;No upper extremity supported Sitting balance-[name]: Fair     Standing balance support: Bilateral upper extremity supported;During functional activity Standing balance-[name]: Poor Standing balance comment: Reliant on UE support                            Cognition Arousal/Alertness: Awake/alert Behavior During Therapy: WFL for tasks assessed/performed Overall Cognitive Status: Impaired/Different from baseline Area of Impairment: Safety/judgement;Awareness                         Safety/Judgement: Decreased awareness of deficits;Decreased awareness of safety Awareness: Emergent   General Comments: Pt slightly impulsive and poor balance at times; requires cues to slow down.      Exercises      General Comments        Pertinent Vitals/Pain Pain Assessment: Faces Faces Pain Scale: Hurts little more Pain Location: low back Pain Descriptors / Indicators: Discomfort;Sore Pain Intervention(s):  Limited activity within patient's tolerance;Monitored during session;Repositioned    Home Living Family/patient  expects to be discharged to:: Private residence Living Arrangements: Spouse/significant other                  Prior Function            PT Goals (current goals can now be found in the care plan section) Acute Rehab PT Goals Patient Stated Goal: Home today PT Goal Formulation: With patient Time For Goal Achievement: 04/19/19 Potential to Achieve Goals: Good Progress towards PT goals: Progressing toward goals    Frequency    Min 5X/week      PT Plan Current plan remains appropriate    Co-evaluation              AM-PAC PT "6 Clicks" Mobility   Outcome Measure  Help needed turning from your back to your side while in a flat bed without using bedrails?: None Help needed moving from lying on your back to sitting on the side of a flat bed without using bedrails?: A Little Help needed moving to and from a bed to a chair (including a wheelchair)?: A Little Help needed standing up from a chair using your arms (e.g., wheelchair or bedside chair)?: A Little Help needed to walk in hospital room?: A Little Help needed climbing 3-5 steps with a railing? : A Little 6 Click Score: 19    End of Session Equipment Utilized During Treatment: Gait belt Activity Tolerance: Patient tolerated treatment well Patient left: in bed;with call bell/phone within reach Nurse Communication: Mobility status PT Visit Diagnosis: Unsteadiness on feet (R26.81);Pain Pain - part of body: (back)     Time: 5409-8119 PT Time Calculation (min) (ACUTE ONLY): 14 min  Charges:  $Gait Training: 8-22 mins                     Conni Slipper, PT, DPT Acute Rehabilitation Services Pager: [phone] Office: [phone]    Marylynn Pearson 04/13/2019, 10:51 AM

## 2019-04-13 NOTE — Telephone Encounter (Signed)
Confirmation #:   [id number] W--- tracks

## 2019-04-13 NOTE — Telephone Encounter (Signed)
Key: [id number]------ Cover My meds

## 2019-04-13 NOTE — Telephone Encounter (Signed)
Patient's girlfriend [name] called asked if she can get a call back on the cell phone number because the house number is not working at this time. The number to contact [name] or patient is [phone]   Please see previous note

## 2019-04-14 ENCOUNTER — Other Ambulatory Visit: Payer: Self-pay | Admitting: Specialist

## 2019-04-14 ENCOUNTER — Telehealth: Payer: Self-pay | Admitting: Radiology

## 2019-04-14 ENCOUNTER — Telehealth: Payer: Self-pay | Admitting: Specialist

## 2019-04-14 MED ORDER — XTAMPZA ER 13.5 MG PO C12A: 1.0000 | EXTENDED_RELEASE_CAPSULE | Freq: Two times a day (BID) | ORAL | 0 refills | Status: DC | PRN

## 2019-04-14 MED FILL — Heparin Sodium (Porcine) Inj 1000 Unit/ML: INTRAMUSCULAR | Qty: 30 | Status: AC

## 2019-04-14 MED FILL — Sodium Chloride IV Soln 0.9%: INTRAVENOUS | Qty: 1000 | Status: AC

## 2019-04-14 NOTE — Telephone Encounter (Signed)
I called the patient, I advised that sometimes some insurance companies will require prior auth for some pain meds and we don't know until the pharmacy sends the meds to the insurance comp, then they will let us know, I advised that I did try to do the prior auth but his insurance denied the pain meds.  So I am waiting for Dr. Louanne Skye to change the pain meds to something else.  I advised that he should check with his pharmacy later today on this.  He states that he understands.

## 2019-04-14 NOTE — Telephone Encounter (Signed)
Pt called in said they haven't heard anything about his recent message about his medication and would like a call back from christy or dr.nitka.   [phone]

## 2019-04-14 NOTE — Telephone Encounter (Signed)
[facility] will begin seeing patient on 04/16/2019

## 2019-04-17 ENCOUNTER — Telehealth: Payer: Self-pay | Admitting: Specialist

## 2019-04-17 ENCOUNTER — Telehealth: Payer: Self-pay

## 2019-04-17 NOTE — Telephone Encounter (Signed)
Margaret with [facility] called stating that she needs a new verbal order for start of care 04/21/2019.  Patient services did not start on 04/16/2019.  Cb# is [phone].  Please advise.  Thank you.

## 2019-04-17 NOTE — Telephone Encounter (Signed)
I called and gave verbal auth for this 

## 2019-04-17 NOTE — Telephone Encounter (Signed)
Patient's fiance called.  She would like a call back regarding the patient's medications  Call back number: [phone]

## 2019-04-18 ENCOUNTER — Other Ambulatory Visit: Payer: Self-pay | Admitting: Specialist

## 2019-04-18 ENCOUNTER — Telehealth: Payer: Self-pay | Admitting: Specialist

## 2019-04-18 ENCOUNTER — Other Ambulatory Visit: Payer: Self-pay | Admitting: Surgery

## 2019-04-18 MED ORDER — OXYCODONE HCL 5 MG PO TABS: 5.0000 mg | ORAL_TABLET | ORAL | 0 refills | Status: DC | PRN

## 2019-04-18 NOTE — Telephone Encounter (Signed)
Patient's spouse called.   He is requesting a refill on his oxycodone. He was instructed to be taking two by the on call PA and is out.   Call back number: [phone]

## 2019-04-18 NOTE — Telephone Encounter (Signed)
Carol called. Says the pharmacy says that if Dr. Louanne Skye calls to approve the prescription, they can go ahead and give it. 04/20/2019 is the fill date showing.

## 2019-04-18 NOTE — Telephone Encounter (Signed)
I called and spoke with [name].  She states that he is still having a lot of pain with the Xtampza and oxycodone, so she was giving him Ibuprofen and tylenol. I advised that she should not be giving him the Ibuprofen due to it slowing the healing process.  I did advise that they could use ice on the incision area.  I advised that I had sent a refill request to Dr Louanne Skye for the Oxycodone and to check with the pharmacy later for approval.  She also states that the dressing has come off, I advised that she needs to keep it clean and dry till they come in. She states that she understands this.

## 2019-04-18 NOTE — Telephone Encounter (Signed)
Carol called. She says that the pharmacy will not give the pain medication. Says it is too soon. Would like to know what else he can take. Her call back number is [phone]

## 2019-04-18 NOTE — Telephone Encounter (Signed)
Patient called  He missed a call from our office  Call back number: [phone]

## 2019-04-18 NOTE — Telephone Encounter (Signed)
I called and spoke with [name].  She states that he is still having a lot of pain with the Xtampza and oxycodone, so she was giving him Ibuprofen and tylenol. I advised that she should not be giving him the Ibuprofen due to it slowing the healing process.  I did advise that they could use ice on the incision area.  I advised that I had sent a refill request to Dr Louanne Skye for the Oxycodone and to check with the pharmacy later for approval.  She also states that the dressing has come off, I advised that she needs to keep it clean and dry till they come in. She states that she understands this.

## 2019-04-18 NOTE — Telephone Encounter (Signed)
Tried to call but no answer--will try again later

## 2019-04-19 NOTE — Telephone Encounter (Signed)
Duplicate message. 

## 2019-04-19 NOTE — Telephone Encounter (Signed)
I called [facility] and asked if they could fill pain meds for pt 1 day early as he is postop and was advised by provider to increase his pain meds for 1- 2 days due to the increased pain he was having, I called and advised the patient that I had done this.

## 2019-04-21 ENCOUNTER — Telehealth: Payer: Self-pay | Admitting: Specialist

## 2019-04-21 NOTE — Telephone Encounter (Signed)
[facility] with [facility] called in requesting verbal orders for home health pt for 1 time a week for 1 week, 2 times a week for 4 weeks and 1 time a week for 4 weeks.   [phone]

## 2019-04-21 NOTE — Telephone Encounter (Signed)
I called and VM for Gracee that this is ok for HHPT

## 2019-04-24 NOTE — Discharge Summary (Signed)
Patient ID: [name] MRN: [id number] DOB/AGE: 1965/12/13 54 y.o.  Admit date: 04/11/2019 Discharge date: 04/24/2019  Admission Diagnoses:  Principal Problem:   Spondylolisthesis of lumbar region Active Problems:   Spinal stenosis, lumbar region, with neurogenic claudication   Lumbar disc herniation   Status post lumbar spinal fusion   Discharge Diagnoses:  Principal Problem:   Spondylolisthesis of lumbar region Active Problems:   Spinal stenosis, lumbar region, with neurogenic claudication   Lumbar disc herniation   Status post lumbar spinal fusion  status post Procedure(s): TRANSFORAMINAL LUMBAR INTERBODY FUSION L4-5 WITH PEDICLE SCREWS, RODS, CAGES, LOCAL BONE GRAFT, ALLOGRAFT AND VIVIGEN, CENTRAL DECOMPRESSION L4-5, LEFT L3-4 MICRODISCECTOMY  Past Medical History:  Diagnosis Date  . Anxiety   . Arthritis   . Asthma   . Depression   . Duodenal adenoma   . GERD (gastroesophageal reflux disease)   . Hypertension   . Pneumonia    hx  . Post-traumatic osteoarthritis of left ankle   . Pre-diabetes   . Presence of right artificial knee joint 12/17/2016  . Schizophrenia (HCC)   . Stroke [facility])    2009-or10    Surgeries: Procedure(s): TRANSFORAMINAL LUMBAR INTERBODY FUSION L4-5 WITH PEDICLE SCREWS, RODS, CAGES, LOCAL BONE GRAFT, ALLOGRAFT AND VIVIGEN, CENTRAL DECOMPRESSION L4-5, LEFT L3-4 MICRODISCECTOMY on 04/11/2019   Consultants:   Discharged Condition: Improved  Hospital Course: [name] is an 54 y.o. male who was admitted 04/11/2019 for operative treatment of Spondylolisthesis of lumbar region. Patient failed conservative treatments (please see the history and physical for the specifics) and had severe unremitting pain that affects sleep, daily activities and work/hobbies. After pre-op clearance, the patient was taken to the operating room on 04/11/2019 and underwent  Procedure(s): TRANSFORAMINAL LUMBAR INTERBODY FUSION L4-5 WITH PEDICLE SCREWS, RODS,  CAGES, LOCAL BONE GRAFT, ALLOGRAFT AND VIVIGEN, CENTRAL DECOMPRESSION L4-5, LEFT L3-4 MICRODISCECTOMY.    Patient was given perioperative antibiotics:  Anti-infectives (From admission, onward)   Start     Dose/Rate Route Frequency Ordered Stop   04/11/19 2000  ceFAZolin (ANCEF) IVPB 2g/100 mL premix     2 g 200 mL/hr over 30 Minutes Intravenous Every 8 hours 04/11/19 1444 04/12/19 0332   04/11/19 0645  ceFAZolin (ANCEF) IVPB 2g/100 mL premix     2 g 200 mL/hr over 30 Minutes Intravenous On call to O.R. 04/11/19 6063 04/11/19 1206       Patient was given sequential compression devices and early ambulation to prevent DVT.   Patient benefited maximally from hospital stay and there were no complications. At the time of discharge, the patient was urinating/moving their bowels without difficulty, tolerating a regular diet, pain is controlled with oral pain medications and they have been cleared by PT/OT.   Recent vital signs: No data found.   Recent laboratory studies: No results for input(s): WBC, HGB, HCT, PLT, NA, K, CL, CO2, BUN, CREATININE, GLUCOSE, INR, CALCIUM in the last 72 hours.  Invalid input(s): PT, 2   Discharge Medications:   Allergies as of 04/13/2019   No Known Allergies     Medication List    TAKE these medications   albuterol 108 (90 Base) MCG/ACT inhaler Commonly known as: VENTOLIN HFA Inhale 1 puff into the lungs 2 (two) times daily as needed for wheezing or shortness of breath. What changed: how much to take   amLODipine 2.5 MG tablet Commonly known as: NORVASC TAKE 1 TABLET BY MOUTH ONCE DAILY.   benztropine 1 MG tablet Commonly known as: COGENTIN  Take 1 mg by mouth daily.   buPROPion 300 MG 24 hr tablet Commonly known as: WELLBUTRIN XL Take 300 mg by mouth daily.   cetirizine 10 MG tablet Commonly known as: ZYRTEC TAKE 1 TABLET BY MOUTH ONCE DAILY. What changed: additional instructions   docusate sodium 100 MG capsule Commonly known as:  COLACE Take 1 capsule (100 mg total) by mouth 2 (two) times daily.   gabapentin 300 MG capsule Commonly known as: NEURONTIN Take 1 capsule (300 mg total) by mouth 2 (two) times daily. What changed: See the new instructions.   Invega Trinza 819 MG/2.625ML Susy Generic drug: Paliperidone Palmitate ER Inject 819 mg into the skin every 3 (three) months.   Melatonin 5 MG Tabs Take 5 mg by mouth at bedtime.   methocarbamol 500 MG tablet Commonly known as: ROBAXIN Take 1 tablet (500 mg total) by mouth every 6 (six) hours as needed for muscle spasms.   mirtazapine 15 MG tablet Commonly known as: REMERON Take 15 mg by mouth at bedtime.   omeprazole 40 MG capsule Commonly known as: PRILOSEC TAKE 1 CAPSULE(40 MG) BY MOUTH DAILY   sucralfate 1 g tablet Commonly known as: CARAFATE TAKE 1 TABLET FOUR TIMES DAILY FOR ACID REFLUX. What changed:   how much to take  how to take this  when to take this  additional instructions   Vitrum Senior Tabs Take 1 tablet by mouth daily.       Diagnostic Studies: DG Chest 2 View  Result Date: 04/07/2019 CLINICAL DATA:  Preoperative lumbar spine surgery.  Hypertension. EXAM: CHEST - 2 VIEW COMPARISON:  July 10, 2016. FINDINGS: Lungs are clear. The heart size and pulmonary vascularity are normal. No adenopathy. No bone lesions. IMPRESSION: Lungs clear.  No evident adenopathy.  Stable cardiac silhouette. Electronically Signed   By: Bretta Bang III M.D.   On: 04/07/2019 14:19   DG Lumbar Spine Complete  Result Date: 04/11/2019 CLINICAL DATA:  Lumbar fusion EXAM: LUMBAR SPINE - COMPLETE 4+ VIEW; DG C-ARM 1-60 MIN COMPARISON:  None. FLUOROSCOPY TIME:  1 minutes 32 seconds FINDINGS: Frontal, lateral, and oblique intraoperative radiographs demonstrate operative changes of posterior and anterior fusion at L4-L5. There is bilateral pedicle screw fixation and interbody cage. IMPRESSION: Fluoroscopic guidance for L4-L5 fusion. Electronically Signed    By: Guadlupe Spanish M.D.   On: 04/11/2019 13:07   DG C-Arm 1-60 Min  Result Date: 04/11/2019 CLINICAL DATA:  Lumbar fusion EXAM: LUMBAR SPINE - COMPLETE 4+ VIEW; DG C-ARM 1-60 MIN COMPARISON:  None. FLUOROSCOPY TIME:  1 minutes 32 seconds FINDINGS: Frontal, lateral, and oblique intraoperative radiographs demonstrate operative changes of posterior and anterior fusion at L4-L5. There is bilateral pedicle screw fixation and interbody cage. IMPRESSION: Fluoroscopic guidance for L4-L5 fusion. Electronically Signed   By: Guadlupe Spanish M.D.   On: 04/11/2019 13:07    Discharge Instructions    Call MD / Call 911   Complete by: As directed    If you experience chest pain or shortness of breath, CALL 911 and be transported to the hospital emergency room.  If you develope a fever above 101 F, pus (white drainage) or increased drainage or redness at the wound, or calf pain, call your surgeon's office.   Constipation Prevention   Complete by: As directed    Drink plenty of fluids.  Prune juice may be helpful.  You may use a stool softener, such as Colace (over the counter) 100 mg twice a day.  Use MiraLax (  over the counter) for constipation as needed.   Diet - low sodium heart healthy   Complete by: As directed    Discharge instructions   Complete by: As directed    Call if there is increasing drainage, fever greater than 101.5, severe head aches, and worsening nausea or light sensitivity. If shortness of breath, bloody cough or chest tightness or pain go to an emergency room. No lifting greater than 10 lbs. Avoid bending, stooping and twisting. Use brace when sitting and out of bed even to go to bathroom. Walk in house for first 2 weeks then may start to get out slowly increasing distances up to one mile by 4-6 weeks post op. After 5 days may shower and change dressing following bathing with shower.When bathing remove the brace shower and replace brace before getting out of the shower. If drainage,  keep dry dressing and do not bathe the incision, use an moisture impervious dressing. Please call and return for scheduled follow up appointment 2 weeks from the time of surgery.  Metformin should not be taken till AM 04/13/2018.   Driving restrictions   Complete by: As directed    No driving for 6 weeks   Increase activity slowly as tolerated   Complete by: As directed    Lifting restrictions   Complete by: As directed    No lifting for 12 weeks      Follow-up Information    Kerrin Champagne, MD In 2 weeks.   Specialty: Orthopedic Surgery Why: For wound re-check Contact information: [address] [place] [address] [phone]        [facility] up.   Specialty: Home Health Services Why: For home health services. They will call you in the next 1-2 dats to set up your first home visit Contact information: [address] [place] [address] [phone]           Discharge Plan:  discharge to home  Disposition:     Signed: Zonia Kief for Vira Browns MD 04/24/2019, 10:33 AM

## 2019-04-26 ENCOUNTER — Ambulatory Visit: Payer: Self-pay

## 2019-04-26 ENCOUNTER — Telehealth: Payer: Self-pay

## 2019-04-26 ENCOUNTER — Other Ambulatory Visit: Payer: Self-pay

## 2019-04-26 ENCOUNTER — Encounter: Payer: Self-pay | Admitting: Surgery

## 2019-04-26 ENCOUNTER — Ambulatory Visit (INDEPENDENT_AMBULATORY_CARE_PROVIDER_SITE_OTHER): Payer: Medicare Other | Admitting: Surgery

## 2019-04-26 VITALS — Ht 69.0 in | Wt 218.0 lb

## 2019-04-26 DIAGNOSIS — Z981 Arthrodesis status: Secondary | ICD-10-CM | POA: Diagnosis not present

## 2019-04-26 MED ORDER — XTAMPZA ER 13.5 MG PO C12A: 1.0000 | EXTENDED_RELEASE_CAPSULE | Freq: Two times a day (BID) | ORAL | 0 refills | Status: DC | PRN

## 2019-04-26 MED ORDER — OXYCODONE HCL 5 MG PO TABS: 5.0000 mg | ORAL_TABLET | ORAL | 0 refills | Status: DC | PRN

## 2019-04-26 NOTE — Progress Notes (Signed)
Post-Op Visit Note   Patient: [name]           Date of Birth: 09-Jul-1965           MRN: [id number] Visit Date: 04/26/2019 PCP: [name], MD   Assessment & Plan:  Chief Complaint:  Chief Complaint  Patient presents with  . Lower Back - Routine Post Op  54 year old male is 2 weeks status post L4-5 fusion and L3-4 microdiscectomy returns.  States that he is doing well.  Preop leg pain is much improved.  States that he needs refills on both of his narcotic pain medication.  He is also requesting a Rollator walker.    Visit Diagnoses:  1. S/P lumbar fusion     Plan: Refilled patient's pain medications.  He is currently taking oxycodone IR and Xtampza ER.  Advised patient the next time his medications need to be refilled that Dr. Otelia Sergeant may begin the weaning process.  He must wear his brace  Follow-Up Instructions: Return in about 4 weeks (around 05/24/2019) for Needs ROV with Dr. Otelia Sergeant.   Orders:  Orders Placed This Encounter  Procedures  . XR Lumbar Spine 2-3 Views   Meds ordered this encounter  Medications  . oxyCODONE (OXY IR/ROXICODONE) 5 MG immediate release tablet    Sig: Take 1 tablet (5 mg total) by mouth every 4 (four) hours as needed for breakthrough pain ((score 4 to 6)).    Dispense:  40 tablet    Refill:  0  . oxyCODONE ER (XTAMPZA ER) 13.5 MG C12A    Sig: Take 1 tablet by mouth every 12 (twelve) hours as needed.    Dispense:  28 capsule    Refill:  0    Imaging: XR Lumbar Spine 2-3 Views  Result Date: 04/26/2019 AP lateral lumbar spine x-rays show hardware intact at L4-5.  No complicating features.   PMFS History: Patient Active Problem List   Diagnosis Date Noted  . Spinal stenosis, lumbar region, with neurogenic claudication 04/11/2019    Class: Chronic  . Lumbar disc herniation 04/11/2019    Class: Chronic  . Status post lumbar spinal fusion 04/11/2019  . Hardware complicating wound infection (HCC) 01/31/2018  . Spondylolisthesis  of lumbar region 12/30/2017  . S/P ankle fusion 10/20/2017  . Post-traumatic osteoarthritis, left ankle and foot 01/07/2017  . Primary osteoarthritis of right knee 05/27/2016  . GERD (gastroesophageal reflux disease) 07/09/2015  . Mild persistent asthma 07/09/2015  . Type 2 diabetes mellitus (HCC) 07/09/2015  . Polyp of duodenum 06/04/2015  . Alcohol dependence (HCC) 02/25/2015  . Moderate episode of recurrent major depressive disorder (HCC) 02/25/2015  . Upper GI bleed 02/18/2015  . Benign essential HTN 02/18/2015  . Anemia of chronic disease 02/18/2015  . Pulmonary nodule 02/18/2015  . Cocaine abuse (HCC) 03/02/2014  . Posttraumatic stress disorder 03/02/2014  . History of CVA (cerebrovascular accident) 01/12/2014  . Schizophrenia (HCC) 01/12/2014  . Tobacco dependence syndrome 12/06/2013   Past Medical History:  Diagnosis Date  . Anxiety   . Arthritis   . Asthma   . Depression   . Duodenal adenoma   . GERD (gastroesophageal reflux disease)   . Hypertension   . Pneumonia    hx  . Post-traumatic osteoarthritis of left ankle   . Pre-diabetes   . Presence of right artificial knee joint 12/17/2016  . Schizophrenia (HCC)   . Stroke [facility])    2009-or10    Family History  Adopted: Yes  Problem Relation  Age of Onset  . Colon cancer Neg Hx   . Esophageal cancer Neg Hx   . Rectal cancer Neg Hx   . Stomach cancer Neg Hx     Past Surgical History:  Procedure Laterality Date  . ANKLE ARTHROSCOPY Left 10/20/2017   Procedure: ANKLE ARTHROSCOPY;  Surgeon: Nadara Mustard, MD;  Location: [facility] OR;  Service: Orthopedics;  Laterality: Left;  . ANKLE FUSION Left 10/20/2017  . ANKLE FUSION Left 10/20/2017   Procedure: LEFT ANKLE FUSION;  Surgeon: Nadara Mustard, MD;  Location: [facility] OR;  Service: Orthopedics;  Laterality: Left;  . ESOPHAGOGASTRODUODENOSCOPY Left 12/05/2013   Procedure: ESOPHAGOGASTRODUODENOSCOPY (EGD);  Surgeon: Willis Modena, MD;  Location: [facility];  Service: Endoscopy;   Laterality: Left;  . ESOPHAGOGASTRODUODENOSCOPY (EGD) WITH PROPOFOL N/A 02/20/2015   Procedure: ESOPHAGOGASTRODUODENOSCOPY (EGD) WITH PROPOFOL;  Surgeon: Charlott Rakes, MD;  Location: [facility];  Service: Endoscopy;  Laterality: N/A;  . HARDWARE REMOVAL Left 01/29/2018   Procedure: HARDWARE REMOVAL LEFT ANKLE;  Surgeon: Nadara Mustard, MD;  Location: [facility] OR;  Service: Orthopedics;  Laterality: Left;  . JOINT REPLACEMENT    . TOTAL KNEE ARTHROPLASTY Right 07/21/2016   Procedure: TOTAL KNEE ARTHROPLASTY;  Surgeon: Cammy Copa, MD;  Location: [facility] OR;  Service: Orthopedics;  Laterality: Right;  . UPPER GASTROINTESTINAL ENDOSCOPY     Social History   Occupational History  . Not on file  Tobacco Use  . Smoking status: Heavy Tobacco Smoker    Packs/day: 1.50    Years: 38.00    Pack years: 57.00    Types: Cigarettes  . Smokeless tobacco: Never Used  . Tobacco comment: onset age 27; upto 1.5 ppd  Substance and Sexual Activity  . Alcohol use: Not Currently    Alcohol/week: 3.0 standard drinks    Types: 3 Cans of beer per week    Comment: quit 2015; formerly up to 2 fifths/day  . Drug use: No  . Sexual activity: Yes    Comment: declined condoms   Exam Very pleasant male alert and oriented in no acute distress.  Wound looks good.  Staples removed and Steri-Strips applied.  No drainage or signs of infection.

## 2019-04-26 NOTE — Telephone Encounter (Signed)
Talked with patient and advised him that he left his back brace.  Patient stated that he will try to pick it up on Friday, 04/28/2019, but if not he will try next week. No transportation at this time.

## 2019-04-28 ENCOUNTER — Encounter: Payer: Medicare Other | Admitting: Internal Medicine

## 2019-05-02 ENCOUNTER — Telehealth: Payer: Self-pay | Admitting: Surgery

## 2019-05-02 NOTE — Telephone Encounter (Signed)
Today I called [name] to speak with him about his recent surgery.  Patient is status post L4-5 fusion and L3-4 microdiscectomy.  Last seen by me April 26, 2019.  Patient left the office that day and forgot his lumbar brace.  My assistant called him that afternoon and advised him the importance of him coming back to pick it up and wear it as instructed.  Brace is still here in our office.  I advised patient that he needs to get his brace.  States that he does not have transportation.  I advised him that he should at least try to get somebody to come pick it up for him soon.  I advised him of potential postop complications with pseudoarthrosis, hardware failure and need for more surgery in the future.  States that he will he will try to get up here to get it.

## 2019-05-03 ENCOUNTER — Telehealth: Payer: Self-pay | Admitting: Specialist

## 2019-05-03 NOTE — Telephone Encounter (Signed)
Matt/[facility] called to advise he was told the patient fell on Saturday 2/30/20. Not hurt. During PT no changes in moving or ranges. Patient seems to be more concerned about pain medication.  You can call Catalina Antigua and welcomed to leave a message on his restricted voicemail. [phone]

## 2019-05-04 NOTE — Telephone Encounter (Signed)
See below

## 2019-05-04 NOTE — Telephone Encounter (Signed)
Called Matt to advise on message below

## 2019-05-04 NOTE — Telephone Encounter (Signed)
We plan to discontinue all narcotics by 6 weeks post surgery. He needs to be working to come down off narcotics. Surgical pain should be over by 6 weeks post op. jen

## 2019-05-05 ENCOUNTER — Telehealth: Payer: Self-pay | Admitting: Specialist

## 2019-05-05 NOTE — Telephone Encounter (Signed)
Advised patient I absolutely would not refill any medication until he comes to pick up his lumbar brace.  I called him a couple of days ago about this and told him that he must come pick this brace up.  He also should still have oxycodone ER that should have lasted 14 days from January 27 visit.

## 2019-05-05 NOTE — Telephone Encounter (Signed)
Pt called in requesting a refill on Oxycodone, pt states he will run out of the medication this weekend. Please have that sent to [facility] on [address]   [phone]

## 2019-05-05 NOTE — Telephone Encounter (Signed)
Can you please advise since Dr Nitka is out of the office? 

## 2019-05-05 NOTE — Telephone Encounter (Signed)
I called patient and advised. 

## 2019-05-09 ENCOUNTER — Telehealth: Payer: Self-pay | Admitting: Specialist

## 2019-05-09 NOTE — Telephone Encounter (Signed)
Patient called for refill of Oxycodone. Stated coming to get brace tomorrow and would like to pick refill up then.  Please call patients to advise,   [phone]

## 2019-05-09 NOTE — Telephone Encounter (Signed)
FYI: I spoke with Dr. Louanne Skye he states that pt needs to get back brace before meds are called in, I called and advised patient he states that he is coming tomorrow morning to see Jeneen Rinks as he did have a fall last week in the kitchen. I advised that we will not give rx till he is seen and evaluated in the office and he is wearing his brace again.  He states that the tylenol and Ibuprofen are not touching his pain

## 2019-05-10 ENCOUNTER — Ambulatory Visit (INDEPENDENT_AMBULATORY_CARE_PROVIDER_SITE_OTHER): Payer: Medicare Other | Admitting: Surgery

## 2019-05-10 ENCOUNTER — Other Ambulatory Visit: Payer: Self-pay

## 2019-05-10 ENCOUNTER — Telehealth: Payer: Self-pay | Admitting: Surgery

## 2019-05-10 ENCOUNTER — Encounter: Payer: Self-pay | Admitting: Surgery

## 2019-05-10 ENCOUNTER — Ambulatory Visit: Payer: Self-pay

## 2019-05-10 DIAGNOSIS — R296 Repeated falls: Secondary | ICD-10-CM

## 2019-05-10 DIAGNOSIS — Z981 Arthrodesis status: Secondary | ICD-10-CM

## 2019-05-10 DIAGNOSIS — R269 Unspecified abnormalities of gait and mobility: Secondary | ICD-10-CM

## 2019-05-10 MED ORDER — OXYCODONE HCL 5 MG PO TABS: 5.0000 mg | ORAL_TABLET | ORAL | 0 refills | Status: DC | PRN

## 2019-05-10 NOTE — Telephone Encounter (Signed)
Patient's friend [name] called asked why can't the Rx for Xtampza ER 13.5 mg be filled today. [name] said they need to get the Rx filled while they have transportation. [name] asked if she can get a call back as soon as possible. The number to contact [name] is [phone]

## 2019-05-10 NOTE — Progress Notes (Signed)
54 year old male who is about 4 weeks status post TRANSFORAMINAL LUMBAR INTERBODY FUSION L4-5 WITH PEDICLE SCREWS, RODS, CAGES, LOCAL BONE GRAFT, ALLOGRAFT AND VIVIGEN, CENTRAL DECOMPRESSION L4-5, LEFT L3-4 MICRODISCECTOMY returns with complaints of a fall last week.  Patient was seen by me 2 weeks postop and he left his brace in our office.  He was contacted multiple times to come get this and stressed the importance of doing so.  He did not get his brace until today's office visit.  States that at home last week his leg gave way causing him to fall.  States that this was not a hard fall.  Exam Surgical incision is well-healed.  He is ambulating well with a walker.  Negative straight leg raise.  Bilateral calves are nontender.  X-rays Reviewed by Dr. Louanne Skye and myself.  Hardware intact.  No signs of fracture.  No acute finding.  Plan Again patient was given his brace today.  He has gone without this since his 2-week postop appointment.  I stressed him the importance of being compliant with using his brace.  Patient states that he has transportation issues.  He request refill of pain medication.  I only refilled the oxycodone 5 mg p.o. every 4 hours as needed pain.

## 2019-05-10 NOTE — Telephone Encounter (Signed)
I called and spoke with [name], I advised the we only rx the [facility] ER for 2 weeks after surgery. And after that he is to start weaning down off the pain meds.  And advised that after 6 weeks, his meds will probably drop again as we are trying to get him off the pain meds,  and that he must wear his back brace like he supposed to. She states that his brace looks like it is to small for him I advised that she should call [employer] as they are the one that fitted him for the brace while he was at the hospital and I gave her their phone number to call them.

## 2019-05-12 ENCOUNTER — Telehealth: Payer: Self-pay | Admitting: Surgery

## 2019-05-12 NOTE — Telephone Encounter (Signed)
Patient's girlfriend [name] called wanting to know why patient is being referred to neurologists?   Please call her to advise and explain the reason for referral.  [phone]

## 2019-05-15 NOTE — Telephone Encounter (Signed)
I called and spoke with [name] and advised that reason for the referral to the neurologist is due to his falls, as  the back surgery should not cause him to fall.  She states that she understands now.

## 2019-05-16 ENCOUNTER — Other Ambulatory Visit: Payer: Self-pay | Admitting: Specialist

## 2019-05-16 NOTE — Telephone Encounter (Signed)
Pt called in requesting a refill on his oxycodone be sent to the [facility] pharmacy on [address] Pt also states the oxycodone often doesn't last and the pain comes back at night; and would like to know if something can be done concerning this.   [phone]

## 2019-05-17 ENCOUNTER — Encounter: Payer: Self-pay | Admitting: Neurology

## 2019-05-17 MED ORDER — OXYCODONE HCL 5 MG PO TABS: 5.0000 mg | ORAL_TABLET | ORAL | 0 refills | Status: DC | PRN

## 2019-05-22 ENCOUNTER — Other Ambulatory Visit: Payer: Self-pay | Admitting: Specialist

## 2019-05-22 NOTE — Telephone Encounter (Signed)
Patient called.   He said he prescription has yet to be called in. He was told it would be.   Call back number: [phone]

## 2019-05-22 NOTE — Addendum Note (Signed)
Addended by: Minda Ditto, Geoffery Spruce on: 05/22/2019 05:06 PM   Modules accepted: Orders

## 2019-05-23 ENCOUNTER — Telehealth: Payer: Self-pay | Admitting: Specialist

## 2019-05-23 MED ORDER — OXYCODONE HCL 5 MG PO TABS: 5.0000 mg | ORAL_TABLET | ORAL | 0 refills | Status: DC | PRN

## 2019-05-23 NOTE — Telephone Encounter (Signed)
Pt called regarding a refill of oxycodone being sent in; pt states its still not at the pharmacy and he would like a call whenever we do get it sent in.   [phone]

## 2019-05-23 NOTE — Telephone Encounter (Signed)
I called and advised patient that I have sent a refill request to Dr. Louanne Skye however he is not due for a refill till Wednesday as it was last Wednesday 05/17/2019. He states that he understands and he will check with the pharmacy.

## 2019-05-24 ENCOUNTER — Encounter: Payer: Self-pay | Admitting: Specialist

## 2019-05-24 ENCOUNTER — Other Ambulatory Visit: Payer: Self-pay

## 2019-05-24 ENCOUNTER — Ambulatory Visit (INDEPENDENT_AMBULATORY_CARE_PROVIDER_SITE_OTHER): Payer: Medicare Other | Admitting: Specialist

## 2019-05-24 ENCOUNTER — Ambulatory Visit (INDEPENDENT_AMBULATORY_CARE_PROVIDER_SITE_OTHER): Payer: Medicare Other

## 2019-05-24 VITALS — BP 139/96 | HR 91 | Ht 69.0 in | Wt 218.0 lb

## 2019-05-24 DIAGNOSIS — M21372 Foot drop, left foot: Secondary | ICD-10-CM

## 2019-05-24 DIAGNOSIS — Z981 Arthrodesis status: Secondary | ICD-10-CM | POA: Diagnosis not present

## 2019-05-24 DIAGNOSIS — G8194 Hemiplegia, unspecified affecting left nondominant side: Secondary | ICD-10-CM

## 2019-05-24 NOTE — Progress Notes (Signed)
Post-Op Visit Note   Patient: [name]           Date of Birth: 21-Sep-1965           MRN: [id number] Visit Date: 05/24/2019 PCP: [name], MD   Assessment & Plan: 5 1/2 weeks post op L3-4 decompression and fusion L4-5  Chief Complaint:  Chief Complaint  Patient presents with  . Lower Back - Routine Post Op   Visit Diagnoses:  1. S/P lumbar fusion   2. Foot drop, left   3. Hx of ankle fusion   4. Left hemiparesis (HCC)   Left foot drop, hemiparesis vs peripheral neuropathy due to spondylolisthesis and L4 radiculopathy. Has 3/5 left foot DF. SLR negative. Incision is healed no swelling, induration or open wounds.   Plan: Avoid bending, stooping and avoid lifting weights greater than 10 lbs. Avoid prolong standing and walking. Avoid frequent bending and stooping  No lifting greater than 10 lbs. May use ice or moist heat for pain. Weight loss is of benefit. Handicap license is approved. Neurology evaluation of the legs to assess left leg weakness and help with prognosis of recovery of left foot DF strength.  Follow-Up Instructions: Return in about 4 weeks (around 06/21/2019).   Orders:  Orders Placed This Encounter  Procedures  . XR Lumbar Spine 2-3 Views   No orders of the defined types were placed in this encounter.   Imaging: XR Lumbar Spine 2-3 Views  Result Date: 05/24/2019 AP and lateral radiographs with pedicle screws and rods fixing L4-5 level, interbody cage in good position and alignmen. The discs spaces are minimally narrowed at L3-4 and L2-3 no acute changes,  No fracture or subluxation other than at the L4-5 level which is improved compared with preop spondylolisthesis.    PMFS History: Patient Active Problem List   Diagnosis Date Noted  . Spinal stenosis, lumbar region, with neurogenic claudication 04/11/2019    Priority: High    Class: Chronic  . Lumbar disc herniation 04/11/2019    Priority: High    Class: Chronic  . Status post  lumbar spinal fusion 04/11/2019  . Hardware complicating wound infection (HCC) 01/31/2018  . Spondylolisthesis of lumbar region 12/30/2017  . S/P ankle fusion 10/20/2017  . Post-traumatic osteoarthritis, left ankle and foot 01/07/2017  . Primary osteoarthritis of right knee 05/27/2016  . GERD (gastroesophageal reflux disease) 07/09/2015  . Mild persistent asthma 07/09/2015  . Type 2 diabetes mellitus (HCC) 07/09/2015  . Polyp of duodenum 06/04/2015  . Alcohol dependence (HCC) 02/25/2015  . Moderate episode of recurrent major depressive disorder (HCC) 02/25/2015  . Upper GI bleed 02/18/2015  . Benign essential HTN 02/18/2015  . Anemia of chronic disease 02/18/2015  . Pulmonary nodule 02/18/2015  . Cocaine abuse (HCC) 03/02/2014  . Posttraumatic stress disorder 03/02/2014  . History of CVA (cerebrovascular accident) 01/12/2014  . Schizophrenia (HCC) 01/12/2014  . Tobacco dependence syndrome 12/06/2013   Past Medical History:  Diagnosis Date  . Anxiety   . Arthritis   . Asthma   . Depression   . Duodenal adenoma   . GERD (gastroesophageal reflux disease)   . Hypertension   . Pneumonia    hx  . Post-traumatic osteoarthritis of left ankle   . Pre-diabetes   . Presence of right artificial knee joint 12/17/2016  . Schizophrenia (HCC)   . Stroke [facility])    2009-or10    Family History  Adopted: Yes  Problem Relation Age of Onset  . Colon  cancer Neg Hx   . Esophageal cancer Neg Hx   . Rectal cancer Neg Hx   . Stomach cancer Neg Hx     Past Surgical History:  Procedure Laterality Date  . ANKLE ARTHROSCOPY Left 10/20/2017   Procedure: ANKLE ARTHROSCOPY;  Surgeon: Nadara Mustard, MD;  Location: [facility] OR;  Service: Orthopedics;  Laterality: Left;  . ANKLE FUSION Left 10/20/2017  . ANKLE FUSION Left 10/20/2017   Procedure: LEFT ANKLE FUSION;  Surgeon: Nadara Mustard, MD;  Location: [facility] OR;  Service: Orthopedics;  Laterality: Left;  . ESOPHAGOGASTRODUODENOSCOPY Left 12/05/2013    Procedure: ESOPHAGOGASTRODUODENOSCOPY (EGD);  Surgeon: Willis Modena, MD;  Location: [facility];  Service: Endoscopy;  Laterality: Left;  . ESOPHAGOGASTRODUODENOSCOPY (EGD) WITH PROPOFOL N/A 02/20/2015   Procedure: ESOPHAGOGASTRODUODENOSCOPY (EGD) WITH PROPOFOL;  Surgeon: Charlott Rakes, MD;  Location: [facility];  Service: Endoscopy;  Laterality: N/A;  . HARDWARE REMOVAL Left 01/29/2018   Procedure: HARDWARE REMOVAL LEFT ANKLE;  Surgeon: Nadara Mustard, MD;  Location: [facility] OR;  Service: Orthopedics;  Laterality: Left;  . JOINT REPLACEMENT    . TOTAL KNEE ARTHROPLASTY Right 07/21/2016   Procedure: TOTAL KNEE ARTHROPLASTY;  Surgeon: Cammy Copa, MD;  Location: [facility] OR;  Service: Orthopedics;  Laterality: Right;  . UPPER GASTROINTESTINAL ENDOSCOPY     Social History   Occupational History  . Not on file  Tobacco Use  . Smoking status: Heavy Tobacco Smoker    Packs/day: 1.50    Years: 38.00    Pack years: 57.00    Types: Cigarettes  . Smokeless tobacco: Never Used  . Tobacco comment: onset age 79; upto 1.5 ppd  Substance and Sexual Activity  . Alcohol use: Not Currently    Alcohol/week: 3.0 standard drinks    Types: 3 Cans of beer per week    Comment: quit 2015; formerly up to 2 fifths/day  . Drug use: No  . Sexual activity: Yes    Comment: declined condoms

## 2019-05-24 NOTE — Patient Instructions (Signed)
Avoid bending, stooping and avoid lifting weights greater than 10 lbs. Avoid prolong standing and walking. Avoid frequent bending and stooping  No lifting greater than 10 lbs. May use ice or moist heat for pain. Weight loss is of benefit. Handicap license is approved. Neurology evaluation of the legs to assess left leg weakness and help with prognosis of recovery of left foot DF strength.

## 2019-05-29 ENCOUNTER — Telehealth: Payer: Self-pay | Admitting: Pharmacy Technician

## 2019-05-29 ENCOUNTER — Encounter: Payer: Medicare Other | Admitting: Family

## 2019-05-29 NOTE — Telephone Encounter (Signed)
[name] Encounter  Insurance verification completed.    The patient is insured through [employer].  We will continue to follow to see if copay assistance is needed.  Ryatt Corsino E. Chamya Hunton, CPhT [facility] Phone: [phone] Fax:  [phone]   

## 2019-05-30 ENCOUNTER — Other Ambulatory Visit: Payer: Self-pay | Admitting: Specialist

## 2019-05-30 NOTE — Telephone Encounter (Signed)
Patient called needing Rx refilled (Oxycodone) The number to contact patient is [phone]

## 2019-05-31 MED ORDER — OXYCODONE HCL 5 MG PO TABS: 5.0000 mg | ORAL_TABLET | ORAL | 0 refills | Status: DC | PRN

## 2019-06-06 ENCOUNTER — Telehealth: Payer: Self-pay | Admitting: Specialist

## 2019-06-06 ENCOUNTER — Other Ambulatory Visit: Payer: Self-pay | Admitting: Specialist

## 2019-06-06 NOTE — Telephone Encounter (Signed)
Pt called in stating he needed a refill of oxycodone sent to his [place] on [address]

## 2019-06-06 NOTE — Telephone Encounter (Signed)
Patient called requesting a refill of oxycodone. Please send to pharmacy on file. Patient request a call back from Dr. Otho Ket nurse. Patient phone number is [phone].

## 2019-06-07 NOTE — Telephone Encounter (Signed)
This request has been sent to Dr. Louanne Skye it is pending with him.

## 2019-06-08 ENCOUNTER — Telehealth: Payer: Self-pay | Admitting: Specialist

## 2019-06-08 MED ORDER — OXYCODONE HCL 5 MG PO TABS: 5.0000 mg | ORAL_TABLET | ORAL | 0 refills | Status: DC | PRN

## 2019-06-08 NOTE — Telephone Encounter (Signed)
Pt called in requesting a refill on Oxycodone, please have that sent to [facility] on [address]   [phone]

## 2019-06-08 NOTE — Telephone Encounter (Signed)
I called advised meds sent in to pharmacy

## 2019-06-14 ENCOUNTER — Other Ambulatory Visit: Payer: Self-pay | Admitting: Specialist

## 2019-06-14 NOTE — Telephone Encounter (Signed)
Refill request sent to Dr. Nitka °

## 2019-06-14 NOTE — Telephone Encounter (Signed)
Patient called and requested a refill for -Oxycodone.  Please call patient to advise [phone]

## 2019-06-15 ENCOUNTER — Ambulatory Visit: Payer: Medicare Other | Admitting: Orthopedic Surgery

## 2019-06-15 MED ORDER — OXYCODONE HCL 5 MG PO TABS: 5.0000 mg | ORAL_TABLET | Freq: Two times a day (BID) | ORAL | 0 refills | Status: DC

## 2019-06-22 ENCOUNTER — Ambulatory Visit: Payer: Medicare Other | Admitting: Specialist

## 2019-06-22 ENCOUNTER — Other Ambulatory Visit: Payer: Self-pay | Admitting: Specialist

## 2019-06-22 MED ORDER — OXYCODONE HCL 5 MG PO TABS: 5.0000 mg | ORAL_TABLET | Freq: Two times a day (BID) | ORAL | 0 refills | Status: DC

## 2019-06-22 NOTE — Telephone Encounter (Signed)
Patient called to r/s his appointment due to not feeling well.  He requested a RX refill on his Oxycodone since he was not able to make it to his appointment.  CB#[phone].  Thank you.

## 2019-06-26 ENCOUNTER — Ambulatory Visit: Payer: Medicare Other | Admitting: Specialist

## 2019-06-27 NOTE — Telephone Encounter (Signed)
disregard

## 2019-06-29 ENCOUNTER — Telehealth: Payer: Self-pay | Admitting: Specialist

## 2019-06-29 ENCOUNTER — Other Ambulatory Visit: Payer: Self-pay | Admitting: Radiology

## 2019-06-29 NOTE — Telephone Encounter (Signed)
Patient called.   He is requesting a refill on his Oxycodone.   Call back: [phone]

## 2019-06-29 NOTE — Telephone Encounter (Signed)
Sent request to Dr. Nitka 

## 2019-06-29 NOTE — Telephone Encounter (Signed)
Please advise 

## 2019-07-03 ENCOUNTER — Emergency Department (HOSPITAL_COMMUNITY): Payer: Medicare Other

## 2019-07-03 ENCOUNTER — Emergency Department (HOSPITAL_COMMUNITY)
Admission: EM | Admit: 2019-07-03 | Discharge: 2019-07-03 | Disposition: A | Discharge status: Home / Self Care | Payer: Medicare Other | Attending: Emergency Medicine | Admitting: Emergency Medicine

## 2019-07-03 ENCOUNTER — Telehealth: Payer: Self-pay | Admitting: Specialist

## 2019-07-03 ENCOUNTER — Other Ambulatory Visit: Payer: Self-pay | Admitting: Specialist

## 2019-07-03 DIAGNOSIS — I10 Essential (primary) hypertension: Secondary | ICD-10-CM | POA: Diagnosis not present

## 2019-07-03 DIAGNOSIS — E119 Type 2 diabetes mellitus without complications: Secondary | ICD-10-CM | POA: Diagnosis not present

## 2019-07-03 DIAGNOSIS — G8929 Other chronic pain: Secondary | ICD-10-CM | POA: Insufficient documentation

## 2019-07-03 DIAGNOSIS — R110 Nausea: Secondary | ICD-10-CM | POA: Diagnosis not present

## 2019-07-03 DIAGNOSIS — M545 Low back pain: Secondary | ICD-10-CM | POA: Diagnosis present

## 2019-07-03 DIAGNOSIS — R1013 Epigastric pain: Secondary | ICD-10-CM | POA: Diagnosis not present

## 2019-07-03 DIAGNOSIS — Z79899 Other long term (current) drug therapy: Secondary | ICD-10-CM | POA: Insufficient documentation

## 2019-07-03 DIAGNOSIS — F17210 Nicotine dependence, cigarettes, uncomplicated: Secondary | ICD-10-CM | POA: Insufficient documentation

## 2019-07-03 DIAGNOSIS — Z96651 Presence of right artificial knee joint: Secondary | ICD-10-CM | POA: Diagnosis not present

## 2019-07-03 DIAGNOSIS — Z8673 Personal history of transient ischemic attack (TIA), and cerebral infarction without residual deficits: Secondary | ICD-10-CM | POA: Diagnosis not present

## 2019-07-03 LAB — URINALYSIS, ROUTINE W REFLEX MICROSCOPIC
Bilirubin Urine: NEGATIVE
Glucose, UA: NEGATIVE mg/dL
Hgb urine dipstick: NEGATIVE
Ketones, ur: 5 mg/dL — AB
Leukocytes,Ua: NEGATIVE
Nitrite: NEGATIVE
Protein, ur: 30 mg/dL — AB
Specific Gravity, Urine: 1.013 (ref 1.005–1.030)
pH: 6 (ref 5.0–8.0)

## 2019-07-03 LAB — COMPREHENSIVE METABOLIC PANEL
ALT: 16 U/L (ref 0–44)
AST: 39 U/L (ref 15–41)
Albumin: 3.8 g/dL (ref 3.5–5.0)
Alkaline Phosphatase: 115 U/L (ref 38–126)
Anion gap: 13 (ref 5–15)
BUN: 7 mg/dL (ref 6–20)
CO2: 25 mmol/L (ref 22–32)
Calcium: 9.1 mg/dL (ref 8.9–10.3)
Chloride: 96 mmol/L — ABNORMAL LOW (ref 98–111)
Creatinine, Ser: 0.88 mg/dL (ref 0.61–1.24)
GFR calc Af Amer: >60 mL/min (ref >60)
GFR calc non Af Amer: >60 mL/min (ref >60)
Glucose, Bld: 139 mg/dL — ABNORMAL HIGH (ref 70–99)
Potassium: 3.7 mmol/L (ref 3.5–5.1)
Sodium: 134 mmol/L — ABNORMAL LOW (ref 135–145)
Total Bilirubin: 0.9 mg/dL (ref 0.3–1.2)
Total Protein: 7.4 g/dL (ref 6.5–8.1)

## 2019-07-03 LAB — LIPASE, BLOOD: Lipase: 12 U/L (ref 11–51)

## 2019-07-03 LAB — CBC
HCT: 36 % — ABNORMAL LOW (ref 39.0–52.0)
Hemoglobin: 11.1 g/dL — ABNORMAL LOW (ref 13.0–17.0)
MCH: 24.5 pg — ABNORMAL LOW (ref 26.0–34.0)
MCHC: 30.8 g/dL (ref 30.0–36.0)
MCV: 79.5 fL — ABNORMAL LOW (ref 80.0–100.0)
Platelets: 368 10*3/uL (ref 150–400)
RBC: 4.53 MIL/uL (ref 4.22–5.81)
RDW: 15.6 % — ABNORMAL HIGH (ref 11.5–15.5)
WBC: 13.7 10*3/uL — ABNORMAL HIGH (ref 4.0–10.5)
nRBC: 0 % (ref 0.0–0.2)

## 2019-07-03 MED ORDER — ONDANSETRON 4 MG PO TBDP: 4.0000 mg | ORAL_TABLET | Freq: Three times a day (TID) | ORAL | 0 refills | Status: DC | PRN

## 2019-07-03 MED ORDER — OXYCODONE-ACETAMINOPHEN 5-325 MG PO TABS
2.0000 | ORAL_TABLET | Freq: Once | ORAL | Status: AC
  Administered 2019-07-03: 2 via ORAL
  Filled 2019-07-03: qty 2

## 2019-07-03 MED ORDER — SODIUM CHLORIDE 0.9% FLUSH: 3.0000 mL | Freq: Once | INTRAVENOUS | Status: DC

## 2019-07-03 NOTE — ED Triage Notes (Signed)
Pt arrived by gems from home  The pt reports that he ate some oatmeal yesterday am  And he was ill all day  With abd and back pain nausea vomiting  He has zofran sl at home that has not helped

## 2019-07-03 NOTE — Telephone Encounter (Signed)
Sent request to Dr. Nitka 

## 2019-07-03 NOTE — Discharge Instructions (Addendum)
Please follow-up with your back surgeon in follow-up.  There does not appear to be any fractures or disruption of your prior surgical sites.  Zofran as needed for nausea, you will need to have your family doctor or a pain clinic continue to prescribe pain medications for you.  Emergency department for any severe or worsening symptoms

## 2019-07-03 NOTE — Telephone Encounter (Signed)
Patient's girlfriend [name] asking for a called back about updates about refilling patient's oxycodone. Patient is in severe pain. Patient phone number is [phone]

## 2019-07-03 NOTE — ED Notes (Signed)
Pt provided urinal at bedside and made aware we need urine.

## 2019-07-03 NOTE — Telephone Encounter (Signed)
I called and advised that Dr. Louanne Skye has the request and it is pending his approval.

## 2019-07-03 NOTE — Telephone Encounter (Signed)
Patient's girlfriend [name] called requesting pain medication. She states to have made several phone calls about pains medication. Mrs. [name] states that patient is in severe pain. [name] asked to send this high priority. Please send medication to pharmacy on file. Patient phone number is [phone].

## 2019-07-03 NOTE — ED Provider Notes (Signed)
[facility] EMERGENCY DEPARTMENT Provider Note   CSN: [id number] Arrival date & time: 07/03/19  0321     History Chief Complaint  Patient presents with  . Abdominal Pain    [name] is a 54 y.o. male.  HPI   This patient is a 54 year old male, he has a history of back pain, he has had prior surgery within the last couple of months, spinal stenosis, spinal fusion on January 12, he has also had surgery on his knee and his ankle, this is left him with a fused left ankle and some difficulty with weakness in his left leg which seems to happen from time to time.  He states it will give out on him.  He was in the shower last night when his leg gave out and he fell to the ground striking his back on the edge of the tub.  He states the pain is located in the lower back, he is frustrated because he is out of pain medication since last Wednesday.  Review of the medical record shows that there is been telephone communication between the patient and his orthopedic care team over time in fact he has made multiple phone calls to the office over the last several months asking for medication refills on his oxycodone.  Overnight the patient has had some nausea, he denies a head injury or neck pain, he has minimal epigastric pain which he states is similar to acid reflux.  He is frustrated because he cannot get any pain relief, he states the pain is going back since he ran out of his oxycodone and not just related to last night.  Past Medical History:  Diagnosis Date  . Anxiety   . Arthritis   . Asthma   . Depression   . Duodenal adenoma   . GERD (gastroesophageal reflux disease)   . Hypertension   . Pneumonia    hx  . Post-traumatic osteoarthritis of left ankle   . Pre-diabetes   . Presence of right artificial knee joint 12/17/2016  . Schizophrenia ([place])   . Stroke [facility])    2009-or10    Patient Active Problem List   Diagnosis Date Noted  . Spinal stenosis, lumbar  region, with neurogenic claudication 04/11/2019    Class: Chronic  . Lumbar disc herniation 04/11/2019    Class: Chronic  . Status post lumbar spinal fusion 04/11/2019  . Hardware complicating wound infection ([place]) 01/31/2018  . Spondylolisthesis of lumbar region 12/30/2017  . S/P ankle fusion 10/20/2017  . Post-traumatic osteoarthritis, left ankle and foot 01/07/2017  . Primary osteoarthritis of right knee 05/27/2016  . GERD (gastroesophageal reflux disease) 07/09/2015  . Mild persistent asthma 07/09/2015  . Type 2 diabetes mellitus ([place]) 07/09/2015  . Polyp of duodenum 06/04/2015  . Alcohol dependence ([place]) 02/25/2015  . Moderate episode of recurrent major depressive disorder ([place]) 02/25/2015  . Upper GI bleed 02/18/2015  . Benign essential HTN 02/18/2015  . Anemia of chronic disease 02/18/2015  . Pulmonary nodule 02/18/2015  . Cocaine abuse ([place]) 03/02/2014  . Posttraumatic stress disorder 03/02/2014  . History of CVA (cerebrovascular accident) 01/12/2014  . Schizophrenia ([place]) 01/12/2014  . Tobacco dependence syndrome 12/06/2013    Past Surgical History:  Procedure Laterality Date  . ANKLE ARTHROSCOPY Left 10/20/2017   Procedure: ANKLE ARTHROSCOPY;  Surgeon: Newt Minion, MD;  Location: [facility];  Service: Orthopedics;  Laterality: Left;  . ANKLE FUSION Left 10/20/2017  . ANKLE FUSION Left 10/20/2017  Procedure: LEFT ANKLE FUSION;  Surgeon: Newt Minion, MD;  Location: [facility];  Service: Orthopedics;  Laterality: Left;  . ESOPHAGOGASTRODUODENOSCOPY Left 12/05/2013   Procedure: ESOPHAGOGASTRODUODENOSCOPY (EGD);  Surgeon: Arta Silence, MD;  Location: [facility];  Service: Endoscopy;  Laterality: Left;  . ESOPHAGOGASTRODUODENOSCOPY (EGD) WITH PROPOFOL N/A 02/20/2015   Procedure: ESOPHAGOGASTRODUODENOSCOPY (EGD) WITH PROPOFOL;  Surgeon: Wilford Corner, MD;  Location: [facility];  Service: Endoscopy;  Laterality: N/A;  . HARDWARE REMOVAL Left 01/29/2018   Procedure: HARDWARE  REMOVAL LEFT ANKLE;  Surgeon: Newt Minion, MD;  Location: [facility];  Service: Orthopedics;  Laterality: Left;  . JOINT REPLACEMENT    . TOTAL KNEE ARTHROPLASTY Right 07/21/2016   Procedure: TOTAL KNEE ARTHROPLASTY;  Surgeon: Meredith Pel, MD;  Location: [facility];  Service: Orthopedics;  Laterality: Right;  . UPPER GASTROINTESTINAL ENDOSCOPY         Family History  Adopted: Yes  Problem Relation Age of Onset  . Colon cancer Neg Hx   . Esophageal cancer Neg Hx   . Rectal cancer Neg Hx   . Stomach cancer Neg Hx     Social History   Tobacco Use  . Smoking status: Heavy Tobacco Smoker    Packs/day: 1.50    Years: 38.00    Pack years: 57.00    Types: Cigarettes  . Smokeless tobacco: Never Used  . Tobacco comment: onset age 57; upto 1.5 ppd  Substance Use Topics  . Alcohol use: Not Currently    Alcohol/week: 3.0 standard drinks    Types: 3 Cans of beer per week    Comment: quit 2015; formerly up to 2 fifths/day  . Drug use: No    Home Medications Prior to Admission medications   Medication Sig Start Date End Date Taking? Authorizing Provider  albuterol (PROVENTIL HFA;VENTOLIN HFA) 108 (90 Base) MCG/ACT inhaler Inhale 1 puff into the lungs 2 (two) times daily as needed for wheezing or shortness of breath. Patient taking differently: Inhale 2 puffs into the lungs 2 (two) times daily as needed for wheezing or shortness of breath.  05/14/17   Diallo, Earna Coder, MD  amLODipine (NORVASC) 2.5 MG tablet TAKE 1 TABLET BY MOUTH ONCE DAILY. Patient taking differently: Take 2.5 mg by mouth daily.  08/12/18   Diallo, Earna Coder, MD  benztropine (COGENTIN) 1 MG tablet Take 1 mg by mouth daily.    [provider]  buPROPion (WELLBUTRIN XL) 300 MG 24 hr tablet Take 300 mg by mouth daily.    [provider]  cetirizine (ZYRTEC) 10 MG tablet TAKE 1 TABLET BY MOUTH ONCE DAILY. Patient taking differently: Take 10 mg by mouth daily. No Therapeutic Substitution 03/28/18   Diallo,  Abdoulaye, MD  docusate sodium (COLACE) 100 MG capsule Take 1 capsule (100 mg total) by mouth 2 (two) times daily. 04/13/19   Jessy Oto, MD  gabapentin (NEURONTIN) 300 MG capsule Take 1 capsule (300 mg total) by mouth 2 (two) times daily. 04/13/19   Jessy Oto, MD  INVEGA TRINZA 819 MG/2.625ML SUSY Inject 819 mg into the skin every 3 (three) months. 09/08/17   [provider]  Melatonin 5 MG TABS Take 5 mg by mouth at bedtime. 02/28/19   [provider]  methocarbamol (ROBAXIN) 500 MG tablet Take 1 tablet (500 mg total) by mouth every 6 (six) hours as needed for muscle spasms. 04/13/19   Jessy Oto, MD  mirtazapine (REMERON) 15 MG tablet Take 15 mg by mouth at bedtime.  [provider]  Multiple Vitamins-Minerals (VITRUM SENIOR) TABS Take 1 tablet by mouth daily.  04/30/15   [provider]  omeprazole (PRILOSEC) 40 MG capsule TAKE 1 CAPSULE(40 MG) BY MOUTH DAILY 04/13/19   Jessy Oto, MD  ondansetron (ZOFRAN ODT) 4 MG disintegrating tablet Take 1 tablet (4 mg total) by mouth every 8 (eight) hours as needed for nausea. 07/03/19   Noemi Chapel, MD  oxyCODONE (OXY IR/ROXICODONE) 5 MG immediate release tablet Take 1 tablet (5 mg total) by mouth 2 (two) times daily. 06/22/19   Jessy Oto, MD  sucralfate (CARAFATE) 1 g tablet TAKE 1 TABLET FOUR TIMES DAILY FOR ACID REFLUX. Patient taking differently: Take 1 g by mouth 4 (four) times daily.  06/21/17   Marjie Skiff, MD    Allergies    Patient has no known allergies.  Review of Systems   Review of Systems  Constitutional: Negative for fever.  Respiratory: Negative for cough and shortness of breath.   Gastrointestinal: Positive for abdominal pain, nausea and vomiting.  Musculoskeletal: Positive for back pain.  Neurological: Positive for weakness and numbness.    Physical Exam Updated Vital Signs BP (!) 155/105 (BP Location: Right Arm) Comment: States he hasn't taken his medication this am   Pulse (!) 111   Temp 98.3 F (36.8 C) (Oral)   Resp 18   SpO2 100%   Physical Exam Vitals and nursing note reviewed.  Constitutional:      General: He is not in acute distress.    Appearance: He is well-developed.  HENT:     Head: Normocephalic and atraumatic.     Mouth/Throat:     Pharynx: No oropharyngeal exudate.  Eyes:     General: No scleral icterus.       Right eye: No discharge.        Left eye: No discharge.     Conjunctiva/sclera: Conjunctivae normal.     Pupils: Pupils are equal, round, and reactive to light.  Neck:     Thyroid: No thyromegaly.     Vascular: No JVD.  Cardiovascular:     Rate and Rhythm: Normal rate and regular rhythm.     Heart sounds: Normal heart sounds. No murmur. No friction rub. No gallop.   Pulmonary:     Effort: Pulmonary effort is normal. No respiratory distress.     Breath sounds: Normal breath sounds. No wheezing or rales.  Abdominal:     General: Bowel sounds are normal. There is no distension.     Palpations: Abdomen is soft. There is no mass.     Tenderness: There is no abdominal tenderness.     Comments: Obese, soft and completely nontender  Musculoskeletal:        General: No tenderness or deformity. Normal range of motion.     Cervical back: Normal range of motion and neck supple.     Right lower leg: No edema.     Left lower leg: No edema.     Comments: The patient is able to stand and ambulate with a slightly antalgic gait, states his left foot is fused and he has a dropfoot which makes it difficult to walk.  He has normal strength of the bilateral arms and legs at major muscle groups.  There is mild tenderness over the lumbar spine, well-healed surgical scar  Lymphadenopathy:     Cervical: No cervical adenopathy.  Skin:    General: Skin is warm and dry.     Findings: No  erythema or rash.  Neurological:     Mental Status: He is alert.     Coordination: Coordination normal.     Comments: The patient has normal ability to  flex at the bilateral hips and extend bilateral knees, sensation is normal to the bilateral lower extremities except for the left foot which there appears to be some stocking glove type decree sensation and decreased ability to dorsiflex and plantarflex at the foot.  Psychiatric:        Behavior: Behavior normal.     ED Results / Procedures / Treatments   Labs (all labs ordered are listed, but only abnormal results are displayed) Labs Reviewed  COMPREHENSIVE METABOLIC PANEL - Abnormal; Notable for the following components:      Result Value   Sodium 134 (*)    Chloride 96 (*)    Glucose, Bld 139 (*)    All other components within normal limits  CBC - Abnormal; Notable for the following components:   WBC 13.7 (*)    Hemoglobin 11.1 (*)    HCT 36.0 (*)    MCV 79.5 (*)    MCH 24.5 (*)    RDW 15.6 (*)    All other components within normal limits  URINALYSIS, ROUTINE W REFLEX MICROSCOPIC - Abnormal; Notable for the following components:   APPearance HAZY (*)    Ketones, ur 5 (*)    Protein, ur 30 (*)    Bacteria, UA RARE (*)    All other components within normal limits  LIPASE, BLOOD    EKG None  Radiology DG Lumbar Spine Complete  Result Date: 07/03/2019 CLINICAL DATA:  Right low back and leg pain, fell last week EXAM: LUMBAR SPINE - COMPLETE 4+ VIEW COMPARISON:  05/24/2019 FINDINGS: Negative for fracture or dislocation. Normal mineralization and alignment. Stable changes of instrumented PLIF L4-5. Anterior endplate spurring at all lumbar levels. IMPRESSION: Negative for fracture or other acute bone abnormality. Electronically Signed   By: Lucrezia Europe M.D.   On: 07/03/2019 08:01    Procedures Procedures (including critical care time)  Medications Ordered in ED Medications  sodium chloride flush (NS) 0.9 % injection 3 mL (3 mLs Intravenous Not Given 07/03/19 0744)  oxyCODONE-acetaminophen (PERCOCET/ROXICET) 5-325 MG per tablet 2 tablet (2 tablets Oral Given 07/03/19 0741)     ED Course  I have reviewed the triage vital signs and the nursing notes.  Pertinent labs & imaging results that were available during my care of the patient were reviewed by me and considered in my medical decision making (see chart for details).  Clinical Course as of Jul 03 934  Mon Jul 03, 2019  0932 X-rays of the spine are negative for any acute findings   [BM]    Clinical Course User Index [BM] Noemi Chapel, MD   MDM Rules/Calculators/A&P                      Ultimately this patient likely has run out of his oxycodone as a major cause for his pain, that being said he did have a fall, x-ray will be obtained to rule out injury to the surgical site, his heart rate is 100 bpm on my exam, he is hypertensive, he is not febrile.  I have thoroughly reviewed this patient's prior chart, I have reviewed his x-rays and find no findings of acute fracture Lab work is rather nonspecific showing a mild leukocytosis, he has no fever and no signs of urinary tract infection  The patient is mildly tachycardic at around 105, his blood pressure is slightly elevated at 155/105, he is treated for chronic hypertension.  The patient will need follow-up in the outpatient setting in a pain clinic due to his history of chronic pain, I do not feel comfortable prescribing opiate medications for this patient  He was given 2 Percocet here for pain control  Final Clinical Impression(s) / ED Diagnoses Final diagnoses:  Chronic low back pain without sciatica, unspecified back pain laterality    Rx / DC Orders ED Discharge Orders         Ordered    ondansetron (ZOFRAN ODT) 4 MG disintegrating tablet  Every 8 hours PRN     07/03/19 0935           Noemi Chapel, MD 07/03/19 [phone]

## 2019-07-04 ENCOUNTER — Other Ambulatory Visit: Payer: Self-pay

## 2019-07-04 NOTE — Telephone Encounter (Signed)
Wife calling back stating patient is in a lot of pain ans asking if someone else can fill pain medication for him (on call doc)

## 2019-07-05 ENCOUNTER — Telehealth: Payer: Self-pay | Admitting: Specialist

## 2019-07-05 NOTE — Telephone Encounter (Signed)
I called and lmovm that per Dr. Louanne Skye he is not going to refill his pain meds as he is far enough out of surgery that he should not be needing them. I advised that I would him on the cancellation list and call them if something opens up sooner than 07/24/2019

## 2019-07-05 NOTE — Telephone Encounter (Signed)
Patient's girlfriend called requesting for the patient to be placed on your cancellation list.  Patient's appointment is on April 26.  Please call her when Dr. Louanne Skye approves the medication.  [phone].  Thank you.

## 2019-07-05 NOTE — Telephone Encounter (Signed)
See other message --Dr. Louanne Skye is not going to refill pain meds

## 2019-07-09 NOTE — Progress Notes (Deleted)
NEUROLOGY CONSULTATION NOTE  [name] MRN: [id number] DOB: March 27, 1966  Referring provider: Zonia Kief, PA-C Primary care provider: [name], MD  Reason for consult:  Leg weakness, falls  HISTORY OF PRESENT ILLNESS: [name] is a 54 year old ***-handed male who presents for left leg weakness and multiple falls.  History supplemented by orthopedic notes.    He has residual weakness and fused ankle in his left leg due to prior left knee and ankle surgery.  He was found to have left L3-4 HNP and L4-5 HNP with spinal stenosis.  On 04/11/2019, he underwent L4-5 transforaminal lumbar interbody fusion, L4-5 central decompression and left L3-4 lateral recess decompression.  Intractal   He was seen in the ED on 07/03/2019   Imaging (personally reviewed): 07/03/2019 XR LUMBAR SPINE COMPLETE:  Negative for fracture or dislocation.  Stable changes of instrumented PLIF L4-5.  Anterior endplate spurring at all lumbar levels. 12/19/2018 MRI LEFT HIP WO:  1.  Mild/early hip joint degenerative changes but no fracture or AVN.  2.  Intact bony pelvis.  3.  Torn quadratus femoris uscle with probable intrasubstance liquifed hematoma.  4.  Mild right hamstring tendinopathy and mild left-sided iliopsoas bursitis. 09/21/2018 MRI LUMBAR SPINE WO:  Transitional lumbosacral anatomy.  Severe central canal, bilateral subarticular recess and left foraminal narrowing at L4-5 where advanced facet degenerative disease results in 0.5 cm anterolisthesis.  Disc bulge with superimposed left subarticular recess extrusion at L3-4.  The extrusion could impact the descending left L4 root.  PAST MEDICAL HISTORY: Past Medical History:  Diagnosis Date  . Anxiety   . Arthritis   . Asthma   . Depression   . Duodenal adenoma   . GERD (gastroesophageal reflux disease)   . Hypertension   . Pneumonia    hx  . Post-traumatic osteoarthritis of left ankle   . Pre-diabetes   . Presence of right artificial knee joint  12/17/2016  . Schizophrenia ([facility])   . Stroke [facility])    2009-or10    PAST SURGICAL HISTORY: Past Surgical History:  Procedure Laterality Date  . ANKLE ARTHROSCOPY Left 10/20/2017   Procedure: ANKLE ARTHROSCOPY;  Surgeon: Nadara Mustard, MD;  [facility];  Service: Orthopedics;  Laterality: Left;  . ANKLE FUSION Left 10/20/2017  . ANKLE FUSION Left 10/20/2017   Procedure: LEFT ANKLE FUSION;  Surgeon: Nadara Mustard, MD;  Location: [facility] OR;  Service: Orthopedics;  Laterality: Left;  . ESOPHAGOGASTRODUODENOSCOPY Left 12/05/2013   Procedure: ESOPHAGOGASTRODUODENOSCOPY (EGD);  Surgeon: Willis Modena, MD;  Location: [facility];  Service: Endoscopy;  Laterality: Left;  . ESOPHAGOGASTRODUODENOSCOPY (EGD) WITH PROPOFOL N/A 02/20/2015   Procedure: ESOPHAGOGASTRODUODENOSCOPY (EGD) WITH PROPOFOL;  Surgeon: Charlott Rakes, MD;  Location: [facility];  Service: Endoscopy;  Laterality: N/A;  . HARDWARE REMOVAL Left 01/29/2018   Procedure: HARDWARE REMOVAL LEFT ANKLE;  Surgeon: Nadara Mustard, MD;  Location: [facility] OR;  Service: Orthopedics;  Laterality: Left;  . JOINT REPLACEMENT    . TOTAL KNEE ARTHROPLASTY Right 07/21/2016   Procedure: TOTAL KNEE ARTHROPLASTY;  Surgeon: Cammy Copa, MD;  Location: [facility] OR;  Service: Orthopedics;  Laterality: Right;  . UPPER GASTROINTESTINAL ENDOSCOPY      MEDICATIONS: Current Outpatient Medications on File Prior to Visit  Medication Sig Dispense Refill  . albuterol (PROVENTIL HFA;VENTOLIN HFA) 108 (90 Base) MCG/ACT inhaler Inhale 1 puff into the lungs 2 (two) times daily as needed for wheezing or shortness of breath. (Patient taking differently: Inhale 2 puffs into the  lungs 2 (two) times daily as needed for wheezing or shortness of breath. ) 1 Inhaler 1  . amLODipine (NORVASC) 2.5 MG tablet TAKE 1 TABLET BY MOUTH ONCE DAILY. (Patient taking differently: Take 2.5 mg by mouth daily. ) 35 tablet 5  . benztropine (COGENTIN) 1 MG tablet Take 1 mg by mouth daily.    Marland Kitchen  buPROPion (WELLBUTRIN XL) 300 MG 24 hr tablet Take 300 mg by mouth daily.    . cetirizine (ZYRTEC) 10 MG tablet TAKE 1 TABLET BY MOUTH ONCE DAILY. (Patient taking differently: Take 10 mg by mouth daily. No Therapeutic Substitution) 28 tablet 11  . docusate sodium (COLACE) 100 MG capsule Take 1 capsule (100 mg total) by mouth 2 (two) times daily. 40 capsule 0  . gabapentin (NEURONTIN) 300 MG capsule Take 1 capsule (300 mg total) by mouth 2 (two) times daily. 60 capsule 2  . INVEGA TRINZA 819 MG/2.625ML SUSY Inject 819 mg into the skin every 3 (three) months.  3  . Melatonin 5 MG TABS Take 5 mg by mouth at bedtime.    . methocarbamol (ROBAXIN) 500 MG tablet Take 1 tablet (500 mg total) by mouth every 6 (six) hours as needed for muscle spasms. 40 tablet 1  . mirtazapine (REMERON) 15 MG tablet Take 15 mg by mouth at bedtime.    . Multiple Vitamins-Minerals (VITRUM SENIOR) TABS Take 1 tablet by mouth daily.     Marland Kitchen omeprazole (PRILOSEC) 40 MG capsule TAKE 1 CAPSULE(40 MG) BY MOUTH DAILY 30 capsule 3  . ondansetron (ZOFRAN ODT) 4 MG disintegrating tablet Take 1 tablet (4 mg total) by mouth every 8 (eight) hours as needed for nausea. 10 tablet 0  . oxyCODONE (OXY IR/ROXICODONE) 5 MG immediate release tablet Take 1 tablet (5 mg total) by mouth 2 (two) times daily. 14 tablet 0  . sucralfate (CARAFATE) 1 g tablet TAKE 1 TABLET FOUR TIMES DAILY FOR ACID REFLUX. (Patient taking differently: Take 1 g by mouth 4 (four) times daily. ) 112 tablet 0   No current facility-administered medications on file prior to visit.    ALLERGIES: No Known Allergies  FAMILY HISTORY: Family History  Adopted: Yes  Problem Relation Age of Onset  . Colon cancer Neg Hx   . Esophageal cancer Neg Hx   . Rectal cancer Neg Hx   . Stomach cancer Neg Hx    ***.  SOCIAL HISTORY: Social History   Socioeconomic History  . Marital status: Significant Other    Spouse name: Not on file  . Number of children: Not on file  . Years  of education: Not on file  . Highest education level: Not on file  Occupational History  . Not on file  Tobacco Use  . Smoking status: Heavy Tobacco Smoker    Packs/day: 1.50    Years: 38.00    Pack years: 57.00    Types: Cigarettes  . Smokeless tobacco: Never Used  . Tobacco comment: onset age 68; upto 1.5 ppd  Substance and Sexual Activity  . Alcohol use: Not Currently    Alcohol/week: 3.0 standard drinks    Types: 3 Cans of beer per week    Comment: quit 2015; formerly up to 2 fifths/day  . Drug use: No  . Sexual activity: Yes    Comment: declined condoms  Other Topics Concern  . Not on file  Social History Narrative  . Not on file   Social Determinants of Health   Financial Resource Strain:   .  Difficulty of Paying Living Expenses:   Food Insecurity:   . Worried About Programme researcher, broadcasting/film/video in the Last Year:   . Barista in the Last Year:   Transportation Needs:   . Freight forwarder (Medical):   Marland Kitchen Lack of Transportation (Non-Medical):   Physical Activity:   . Days of Exercise per Week:   . Minutes of Exercise per Session:   Stress:   . Feeling of Stress :   Social Connections:   . Frequency of Communication with Friends and Family:   . Frequency of Social Gatherings with Friends and Family:   . Attends Religious Services:   . Active Member of Clubs or Organizations:   . Attends Banker Meetings:   Marland Kitchen Marital Status:   Intimate Partner Violence:   . Fear of Current or Ex-Partner:   . Emotionally Abused:   Marland Kitchen Physically Abused:   . Sexually Abused:     REVIEW OF SYSTEMS: Constitutional: No fevers, chills, or sweats, no generalized fatigue, change in appetite Eyes: No visual changes, double vision, eye pain Ear, nose and throat: No hearing loss, ear pain, nasal congestion, sore throat Cardiovascular: No chest pain, palpitations Respiratory:  No shortness of breath at rest or with exertion, wheezes GastrointestinaI: No nausea, vomiting,  diarrhea, abdominal pain, fecal incontinence Genitourinary:  No dysuria, urinary retention or frequency Musculoskeletal:  No neck pain, back pain Integumentary: No rash, pruritus, skin lesions Neurological: as above Psychiatric: No depression, insomnia, anxiety Endocrine: No palpitations, fatigue, diaphoresis, mood swings, change in appetite, change in weight, increased thirst Hematologic/Lymphatic:  No purpura, petechiae. Allergic/Immunologic: no itchy/runny eyes, nasal congestion, recent allergic reactions, rashes  PHYSICAL EXAM: *** General: No acute distress.  Patient appears ***-groomed.  *** Head:  Normocephalic/atraumatic Eyes:  fundi examined but not visualized Neck: supple, no paraspinal tenderness, full range of motion Back: No paraspinal tenderness Heart: regular rate and rhythm Lungs: Clear to auscultation bilaterally. Vascular: No carotid bruits. Neurological Exam: Mental status: alert and oriented to person, place, and time, recent and remote memory intact, fund of knowledge intact, attention and concentration intact, speech fluent and not dysarthric, language intact. Cranial nerves: CN I: not tested CN II: pupils equal, round and reactive to light, visual fields intact CN III, IV, VI:  full range of motion, no nystagmus, no ptosis CN V: facial sensation intact CN VII: upper and lower face symmetric CN VIII: hearing intact CN IX, X: gag intact, uvula midline CN XI: sternocleidomastoid and trapezius muscles intact CN XII: tongue midline Bulk & Tone: normal, no fasciculations. Motor:  5/5 throughout *** Sensation:  Pinprick *** temperature *** and vibration sensation intact.  ***. Deep Tendon Reflexes:  2+ throughout, *** toes downgoing.  *** Finger to nose testing:  Without dysmetria.  *** Heel to shin:  Without dysmetria.  *** Gait:  Normal station and stride.  Able to turn and tandem walk. [place] ***.  IMPRESSION: ***  PLAN: ***  Thank you for allowing me  to take part in the care of this patient.  Shon Millet, DO  CC: ***

## 2019-07-10 ENCOUNTER — Ambulatory Visit: Payer: Medicare Other | Admitting: Neurology

## 2019-07-12 ENCOUNTER — Other Ambulatory Visit: Payer: Self-pay | Admitting: Specialist

## 2019-07-13 ENCOUNTER — Ambulatory Visit: Payer: Medicare Other | Admitting: Specialist

## 2019-07-24 ENCOUNTER — Other Ambulatory Visit: Payer: Self-pay | Admitting: Specialist

## 2019-07-24 ENCOUNTER — Ambulatory Visit: Payer: Medicare Other | Admitting: Specialist

## 2019-07-24 NOTE — Addendum Note (Signed)
Addended by: Minda Ditto, Alyse Low N on: 07/24/2019 11:57 AM   Modules accepted: Orders

## 2019-07-24 NOTE — Telephone Encounter (Signed)
Patient called. He would like some pain medication called in for him. His call back number is [phone]

## 2019-07-25 ENCOUNTER — Emergency Department (HOSPITAL_COMMUNITY): Payer: Medicare Other

## 2019-07-25 ENCOUNTER — Inpatient Hospital Stay (HOSPITAL_COMMUNITY)
Admission: EM | Admit: 2019-07-25 | Discharge: 2019-09-05 | DRG: 896 | Disposition: A | Discharge status: Home Health | Payer: Medicare Other | Attending: Internal Medicine | Admitting: Internal Medicine

## 2019-07-25 DIAGNOSIS — E871 Hypo-osmolality and hyponatremia: Secondary | ICD-10-CM | POA: Diagnosis not present

## 2019-07-25 DIAGNOSIS — Z95828 Presence of other vascular implants and grafts: Secondary | ICD-10-CM

## 2019-07-25 DIAGNOSIS — R4182 Altered mental status, unspecified: Secondary | ICD-10-CM

## 2019-07-25 DIAGNOSIS — E876 Hypokalemia: Secondary | ICD-10-CM | POA: Diagnosis not present

## 2019-07-25 DIAGNOSIS — F10231 Alcohol dependence with withdrawal delirium: Principal | ICD-10-CM | POA: Diagnosis present

## 2019-07-25 DIAGNOSIS — E860 Dehydration: Secondary | ICD-10-CM | POA: Diagnosis present

## 2019-07-25 DIAGNOSIS — M1990 Unspecified osteoarthritis, unspecified site: Secondary | ICD-10-CM | POA: Diagnosis present

## 2019-07-25 DIAGNOSIS — Z96651 Presence of right artificial knee joint: Secondary | ICD-10-CM | POA: Diagnosis present

## 2019-07-25 DIAGNOSIS — G92 Toxic encephalopathy: Secondary | ICD-10-CM | POA: Diagnosis present

## 2019-07-25 DIAGNOSIS — F1410 Cocaine abuse, uncomplicated: Secondary | ICD-10-CM | POA: Diagnosis present

## 2019-07-25 DIAGNOSIS — R112 Nausea with vomiting, unspecified: Secondary | ICD-10-CM

## 2019-07-25 DIAGNOSIS — F19130 Other psychoactive substance abuse with withdrawal, uncomplicated: Secondary | ICD-10-CM | POA: Diagnosis present

## 2019-07-25 DIAGNOSIS — N179 Acute kidney failure, unspecified: Secondary | ICD-10-CM

## 2019-07-25 DIAGNOSIS — M4626 Osteomyelitis of vertebra, lumbar region: Secondary | ICD-10-CM

## 2019-07-25 DIAGNOSIS — F17210 Nicotine dependence, cigarettes, uncomplicated: Secondary | ICD-10-CM

## 2019-07-25 DIAGNOSIS — G9340 Encephalopathy, unspecified: Secondary | ICD-10-CM

## 2019-07-25 DIAGNOSIS — E538 Deficiency of other specified B group vitamins: Secondary | ICD-10-CM | POA: Diagnosis present

## 2019-07-25 DIAGNOSIS — J4530 Mild persistent asthma, uncomplicated: Secondary | ICD-10-CM | POA: Diagnosis present

## 2019-07-25 DIAGNOSIS — I69351 Hemiplegia and hemiparesis following cerebral infarction affecting right dominant side: Secondary | ICD-10-CM

## 2019-07-25 DIAGNOSIS — K219 Gastro-esophageal reflux disease without esophagitis: Secondary | ICD-10-CM | POA: Diagnosis present

## 2019-07-25 DIAGNOSIS — M549 Dorsalgia, unspecified: Secondary | ICD-10-CM

## 2019-07-25 DIAGNOSIS — R410 Disorientation, unspecified: Secondary | ICD-10-CM

## 2019-07-25 DIAGNOSIS — I959 Hypotension, unspecified: Secondary | ICD-10-CM | POA: Diagnosis present

## 2019-07-25 DIAGNOSIS — B370 Candidal stomatitis: Secondary | ICD-10-CM

## 2019-07-25 DIAGNOSIS — R6510 Systemic inflammatory response syndrome (SIRS) of non-infectious origin without acute organ dysfunction: Secondary | ICD-10-CM | POA: Diagnosis present

## 2019-07-25 DIAGNOSIS — Z781 Physical restraint status: Secondary | ICD-10-CM

## 2019-07-25 DIAGNOSIS — G894 Chronic pain syndrome: Secondary | ICD-10-CM | POA: Diagnosis present

## 2019-07-25 DIAGNOSIS — G009 Bacterial meningitis, unspecified: Secondary | ICD-10-CM | POA: Diagnosis present

## 2019-07-25 DIAGNOSIS — J45909 Unspecified asthma, uncomplicated: Secondary | ICD-10-CM | POA: Diagnosis present

## 2019-07-25 DIAGNOSIS — G4700 Insomnia, unspecified: Secondary | ICD-10-CM | POA: Diagnosis not present

## 2019-07-25 DIAGNOSIS — E1169 Type 2 diabetes mellitus with other specified complication: Secondary | ICD-10-CM | POA: Diagnosis not present

## 2019-07-25 DIAGNOSIS — F319 Bipolar disorder, unspecified: Secondary | ICD-10-CM | POA: Diagnosis present

## 2019-07-25 DIAGNOSIS — E1165 Type 2 diabetes mellitus with hyperglycemia: Secondary | ICD-10-CM | POA: Diagnosis not present

## 2019-07-25 DIAGNOSIS — J811 Chronic pulmonary edema: Secondary | ICD-10-CM | POA: Diagnosis present

## 2019-07-25 DIAGNOSIS — K567 Ileus, unspecified: Secondary | ICD-10-CM | POA: Diagnosis not present

## 2019-07-25 DIAGNOSIS — F4310 Post-traumatic stress disorder, unspecified: Secondary | ICD-10-CM | POA: Diagnosis present

## 2019-07-25 DIAGNOSIS — G928 Other toxic encephalopathy: Secondary | ICD-10-CM | POA: Diagnosis present

## 2019-07-25 DIAGNOSIS — M4646 Discitis, unspecified, lumbar region: Secondary | ICD-10-CM | POA: Diagnosis present

## 2019-07-25 DIAGNOSIS — I69322 Dysarthria following cerebral infarction: Secondary | ICD-10-CM

## 2019-07-25 DIAGNOSIS — I10 Essential (primary) hypertension: Secondary | ICD-10-CM | POA: Diagnosis present

## 2019-07-25 DIAGNOSIS — D638 Anemia in other chronic diseases classified elsewhere: Secondary | ICD-10-CM | POA: Diagnosis present

## 2019-07-25 DIAGNOSIS — Z791 Long term (current) use of non-steroidal anti-inflammatories (NSAID): Secondary | ICD-10-CM

## 2019-07-25 DIAGNOSIS — Z981 Arthrodesis status: Secondary | ICD-10-CM

## 2019-07-25 DIAGNOSIS — B379 Candidiasis, unspecified: Secondary | ICD-10-CM | POA: Diagnosis present

## 2019-07-25 DIAGNOSIS — F209 Schizophrenia, unspecified: Secondary | ICD-10-CM | POA: Diagnosis present

## 2019-07-25 DIAGNOSIS — J9690 Respiratory failure, unspecified, unspecified whether with hypoxia or hypercapnia: Secondary | ICD-10-CM

## 2019-07-25 DIAGNOSIS — Z20822 Contact with and (suspected) exposure to covid-19: Secondary | ICD-10-CM | POA: Diagnosis present

## 2019-07-25 DIAGNOSIS — I69328 Other speech and language deficits following cerebral infarction: Secondary | ICD-10-CM

## 2019-07-25 DIAGNOSIS — Z79899 Other long term (current) drug therapy: Secondary | ICD-10-CM

## 2019-07-25 DIAGNOSIS — M4624 Osteomyelitis of vertebra, thoracic region: Secondary | ICD-10-CM | POA: Diagnosis present

## 2019-07-25 DIAGNOSIS — T50901A Poisoning by unspecified drugs, medicaments and biological substances, accidental (unintentional), initial encounter: Secondary | ICD-10-CM | POA: Diagnosis present

## 2019-07-25 LAB — COMPREHENSIVE METABOLIC PANEL
ALT: 13 U/L (ref 0–44)
AST: 28 U/L (ref 15–41)
Albumin: 3.3 g/dL — ABNORMAL LOW (ref 3.5–5.0)
Alkaline Phosphatase: 169 U/L — ABNORMAL HIGH (ref 38–126)
Anion gap: 12 (ref 5–15)
BUN: 9 mg/dL (ref 6–20)
CO2: 20 mmol/L — ABNORMAL LOW (ref 22–32)
Calcium: 8.4 mg/dL — ABNORMAL LOW (ref 8.9–10.3)
Chloride: 105 mmol/L (ref 98–111)
Creatinine, Ser: 1.5 mg/dL — ABNORMAL HIGH (ref 0.61–1.24)
GFR calc Af Amer: >60 mL/min (ref >60)
GFR calc non Af Amer: 52 mL/min — ABNORMAL LOW (ref >60)
Glucose, Bld: 99 mg/dL (ref 70–99)
Potassium: 3.5 mmol/L (ref 3.5–5.1)
Sodium: 137 mmol/L (ref 135–145)
Total Bilirubin: 0.5 mg/dL (ref 0.3–1.2)
Total Protein: 7.3 g/dL (ref 6.5–8.1)

## 2019-07-25 LAB — URINALYSIS, ROUTINE W REFLEX MICROSCOPIC
Bilirubin Urine: NEGATIVE
Glucose, UA: NEGATIVE mg/dL
Ketones, ur: NEGATIVE mg/dL
Nitrite: NEGATIVE
Protein, ur: 30 mg/dL — AB
Specific Gravity, Urine: 1.011 (ref 1.005–1.030)
pH: 6 (ref 5.0–8.0)

## 2019-07-25 LAB — CBC WITH DIFFERENTIAL/PLATELET
Abs Immature Granulocytes: 0.08 10*3/uL — ABNORMAL HIGH (ref 0.00–0.07)
Basophils Absolute: 0 10*3/uL (ref 0.0–0.1)
Basophils Relative: 0 %
Eosinophils Absolute: 0.1 10*3/uL (ref 0.0–0.5)
Eosinophils Relative: 0 %
HCT: 33.1 % — ABNORMAL LOW (ref 39.0–52.0)
Hemoglobin: 10.1 g/dL — ABNORMAL LOW (ref 13.0–17.0)
Immature Granulocytes: 1 %
Lymphocytes Relative: 5 %
Lymphs Abs: 0.7 10*3/uL (ref 0.7–4.0)
MCH: 24 pg — ABNORMAL LOW (ref 26.0–34.0)
MCHC: 30.5 g/dL (ref 30.0–36.0)
MCV: 78.6 fL — ABNORMAL LOW (ref 80.0–100.0)
Monocytes Absolute: 0.7 10*3/uL (ref 0.1–1.0)
Monocytes Relative: 5 %
Neutro Abs: 11.5 10*3/uL — ABNORMAL HIGH (ref 1.7–7.7)
Neutrophils Relative %: 89 %
Platelets: 582 10*3/uL — ABNORMAL HIGH (ref 150–400)
RBC: 4.21 MIL/uL — ABNORMAL LOW (ref 4.22–5.81)
RDW: 15.9 % — ABNORMAL HIGH (ref 11.5–15.5)
WBC: 12.9 10*3/uL — ABNORMAL HIGH (ref 4.0–10.5)
nRBC: 0 % (ref 0.0–0.2)

## 2019-07-25 LAB — ETHANOL: Alcohol, Ethyl (B): <10 mg/dL (ref <10)

## 2019-07-25 LAB — RAPID URINE DRUG SCREEN, HOSP PERFORMED
Amphetamines: POSITIVE — AB
Barbiturates: NOT DETECTED
Benzodiazepines: POSITIVE — AB
Cocaine: NOT DETECTED
Opiates: POSITIVE — AB
Tetrahydrocannabinol: NOT DETECTED

## 2019-07-25 LAB — CK: Total CK: 475 U/L — ABNORMAL HIGH (ref 49–397)

## 2019-07-25 MED ORDER — SODIUM CHLORIDE 0.9 % IV SOLN
1.0000 g | Freq: Once | INTRAVENOUS | Status: AC
  Administered 2019-07-26: 1 g via INTRAVENOUS
  Filled 2019-07-25: qty 10

## 2019-07-25 MED ORDER — LACTATED RINGERS IV BOLUS
1000.0000 mL | Freq: Once | INTRAVENOUS | Status: AC
  Administered 2019-07-25: 1000 mL via INTRAVENOUS

## 2019-07-25 NOTE — ED Triage Notes (Signed)
54 yo male found sitting on sidewalk, panting heavily. When EMS arrived HR 160 BP 70/palp. IV access obt and 582ml NS given. Repeat HR in 140's and BP 90/60. Admits to drinking beer today.

## 2019-07-25 NOTE — ED Provider Notes (Addendum)
5:05 PM-checkout from Dr. Kathrynn Humble to evaluate patient for possible toxic encephalopathy.  He presented with altered mental status, and was found to be tachycardic, appearing to be dehydrated, and somewhat sleepy.  He has a history of alcoholism with very high alcohol abuse in the past.  At this time he was sleeping but arouses easily.  He is dysarthric from prior stroke.  No gross focal asymmetry of strength or sensation.  Tongue protrudes in the midline.  Tongue is somewhat thickened, possibly consistent with vitamin deficiency and has signs and symptoms of thrush.  No apparent injury to tongue or lips, to indicate a recent seizure.  Patient states he is thirsty.  We will do stroke swallow screen then allow him to drink and eat.  Ordering extra liter of lactated Ringer's for persistent tachycardia.   Clinical Course as of Jul 25 1216  Tue Jul 25, 2019  2301 Was able to reach the patient's girlfriend, long time living with him, on her mobile phone.  She requests that we return calls to her home phone.  She states she called EMS today because the patient was confused, hallucinating, and having "incredible back pain."  He is taking his usual medications without relief.  She has other concerns including that the patient has been having ongoing back pain since surgery, in January 2021, having headaches which are unusual for him, and may need to take his [place] shot, because he is having hallucinations.  She is available to talk for further questions or concerns.   [EW]  2305 Normal  Ethanol [EW]  2305 Abnormal, high  CK(!) [EW]  2305 Normal except white count high, hemoglobin low, MCV low, platelets high  CBC with Differential(!) [EW]  2305 Normal except presence of opiates, benzodiazepines and amphetamines.  Urine rapid drug screen (hosp performed)(!) [EW]  2305 Normal except CO2 low, creatinine high, calcium low, albumin low, GFR low  Comprehensive metabolic panel(!) [EW]  99991111 Normal except presence  of hemoglobin, protein, leukocytes, WBC, and rare bacteria present.  Urine culture ordered  Urinalysis, Routine w reflex microscopic(!) [EW]  Wed Jul 26, 2019  1214 nRBC: 0.0 [EW]  1216 HCT(!): 30.2 [EW]    Clinical Course User Index [EW] Daleen Bo, MD    Patient Vitals for the past 24 hrs:  BP Temp Temp src Pulse Resp SpO2  07/26/19 1000 (!) 154/98 -- -- (!) 110 -- 98 %  07/26/19 0930 (!) 141/103 -- -- (!) 109 -- 96 %  07/26/19 0900 (!) 159/96 -- -- (!) 113 -- 95 %  07/26/19 0856 (!) 138/105 -- -- (!) 109 -- --  07/26/19 0830 (!) 138/105 -- -- (!) 109 -- 95 %  07/26/19 0800 (!) 160/100 -- -- (!) 112 -- 99 %  07/26/19 0730 (!) 158/111 -- -- (!) 110 -- 98 %  07/26/19 0700 (!) 158/112 -- -- (!) 110 19 95 %  07/26/19 0632 (!) 138/93 -- -- (!) 113 19 94 %  07/26/19 0630 (!) 138/93 -- -- (!) 113 -- 94 %  07/26/19 0530 (!) 130/93 -- -- (!) 129 19 98 %  07/26/19 0500 (!) 143/98 -- -- (!) 114 -- 96 %  07/26/19 0430 122/87 -- -- (!) 115 -- 93 %  07/26/19 0400 (!) 144/93 -- -- (!) 116 18 95 %  07/26/19 0346 (!) 154/94 -- -- (!) 116 -- --  07/26/19 0330 (!) 154/94 -- -- (!) 118 -- 98 %  07/26/19 0300 (!) 154/105 -- -- (!) 118 --  96 %  07/26/19 0255 -- -- -- (!) 118 -- --  07/26/19 0241 (!) 179/102 -- -- (!) 122 18 96 %  07/26/19 0230 (!) 144/106 -- -- (!) 121 -- --  07/26/19 0230 (!) 157/109 -- -- (!) 123 -- 97 %  07/26/19 0130 (!) 155/106 -- -- (!) 119 -- 99 %  07/26/19 0112 (!) 144/106 -- -- (!) 116 -- 98 %  07/25/19 2230 (!) 132/99 -- -- (!) 112 -- 93 %  07/25/19 2200 (!) 143/99 -- -- (!) 109 -- 98 %  07/25/19 2130 (!) 132/95 -- -- (!) 109 16 98 %  07/25/19 2100 (!) 139/99 -- -- (!) 112 -- 98 %  07/25/19 2000 (!) 142/96 -- -- (!) 118 -- 98 %  07/25/19 1915 (!) 137/92 -- -- (!) 114 14 100 %  07/25/19 1900 (!) 138/91 -- -- (!) 119 16 100 %  07/25/19 1830 (!) 132/93 -- -- (!) 121 16 99 %  07/25/19 1815 117/86 -- -- (!) 121 17 99 %  07/25/19 1800 (!) 120/91 -- -- (!) 122 16 100 %   07/25/19 1745 120/82 -- -- (!) 122 16 100 %  07/25/19 1730 (!) 140/99 -- -- (!) 121 16 100 %  07/25/19 1715 134/90 -- -- (!) 116 15 100 %  07/25/19 1700 126/90 -- -- (!) 114 14 96 %  07/25/19 1645 123/84 -- -- (!) 119 14 97 %  07/25/19 1630 (!) 125/93 -- -- (!) 123 14 96 %  07/25/19 1615 (!) 114/92 -- -- (!) 127 14 96 %  07/25/19 1600 122/81 -- -- (!) 127 13 97 %  07/25/19 1545 (!) 121/96 -- -- (!) 127 15 96 %  07/25/19 1530 113/88 -- -- (!) 133 16 98 %  07/25/19 1515 100/68 -- -- (!) 139 16 98 %  07/25/19 1500 123/73 99.2 F (37.3 C) Oral (!) 146 -- 97 %    10:48 PM Reevaluation with update and discussion. After initial assessment and treatment, an updated evaluation reveals at this time the patient is resting, and arouses easily.  I asked him where he was and he stated he was at [facility].  When I asked him why he was here he stated that "I drink too much, and he gets worse when I stop drinking."  He states he lives with his girlfriend who is not currently here. He was able to name . Daleen Bo   Medical Decision Making:  This patient is presenting for evaluation of altered mental status, which does require a range of treatment options, and is a complaint that involves a high risk of morbidity and mortality. The differential diagnoses include CVA, Seizure, intoxication, delirium, . I decided  to review old records, and in summary he has ongoing back pain an, history of alcoholism and CVA. I obtained  additional historical information from his significant other. Clinical Laboratory Tests Ordered, included CBC, BMET, UDS, U/A. Labs reviewed: abnormal urine, elevated creatinine, low potassium, low hemoglobin, abnormal UDS. Radiologic Tests Ordered, included chest xray. I independently Visualized: radiographic Images, which show mild CHF   Critical Interventions-medical evaluation, laboratory testing, radiographic evaluation, observation reassessment.  After These Interventions, the  Patient was reevaluated and was found persistently confused.  Patient with confusion and hallucinations, and history of bipolar disorder.  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  She reports he has been using narcotics for back pain but has not had any recently.  He  was due to see his neurologist, but the appointment was canceled.  He has chronic difficulty talking described as "mumbling," by his girlfriend.  He has had some sores in his mouth for an unknown duration.  Today he appears to have thrush.  Patient has improved somewhat but remains confused in the ED. differential diagnosis includes nonspecific delirium, mental status changes post seizure, urinary tract infection, and worsening bipolar disorder.  Focal neurologic exam and chronic dysarthria following strokes.  Patient will require hospitalization for further evaluation  .Critical Care Performed by: Daleen Bo, MD Authorized by: Daleen Bo, MD   Critical care provider statement:    Critical care time (minutes):  35   Critical care start time:  07/25/2019 5:05 PM   Critical care end time:  07/25/2019 11:11 PM   Critical care time was exclusive of:  Separately billable procedures and treating other patients   Critical care was necessary to treat or prevent imminent or life-threatening deterioration of the following conditions:  CNS failure or compromise   Critical care was time spent personally by me on the following activities:  Blood draw for specimens, development of treatment plan with patient or surrogate, discussions with consultants, evaluation of patient's response to treatment, examination of patient, obtaining history from patient or surrogate, ordering and performing treatments and interventions, ordering and review of laboratory studies, pulse oximetry, re-evaluation of patient's condition, review of old charts and ordering and review of radiographic studies    CRITICAL  CARE-yes Performed by: Daleen Bo   Nursing Notes Reviewed/ Care Coordinated Applicable Imaging Reviewed Interpretation of Laboratory Data incorporated into ED treatment  11:11 PM-Consult complete with hospitalist. Patient case explained and discussed. He agrees to admit patient for further evaluation and treatment. Call ended at 1125 PM  Plan: Admit     Daleen Bo, MD 07/26/19 [id number]    Daleen Bo, MD 07/26/19 1242

## 2019-07-25 NOTE — ED Notes (Signed)
Pt arrives with eyes closed. Arousable to voice. Pt mumbling then closing eyes again.

## 2019-07-25 NOTE — ED Provider Notes (Signed)
[facility] DEPT Provider Note   CSN: [id number] Arrival date & time: 07/25/19  1453     History Chief Complaint  Patient presents with  . Tachycardia  . Hypotension    [name] is a 54 y.o. male.  HPI     54 year old male with history of chronic back pain, stroke comes in a chief complaint of altered mental status.  Upon my assessment, patient is noted to be arousable.  Per EMS note, bystanders called EMS because patient was painting while outside.  Patient does not have proper recollection of how or why he was brought to the ER.  Patient denies any chest pain, abdominal pain, nausea, vomiting, fevers, chills.  He has chronic back pain and admits to drinking heavily last night.  He has history of stroke which has left him with some residual right-sided numbness and weakness and slurred speech.  He denies any new neurologic symptoms.  Past Medical History:  Diagnosis Date  . Anxiety   . Arthritis   . Asthma   . Depression   . Duodenal adenoma   . GERD (gastroesophageal reflux disease)   . Hypertension   . Pneumonia    hx  . Post-traumatic osteoarthritis of left ankle   . Pre-diabetes   . Presence of right artificial knee joint 12/17/2016  . Schizophrenia ([place])   . Stroke [facility])    2009-or10    Patient Active Problem List   Diagnosis Date Noted  . Spinal stenosis, lumbar region, with neurogenic claudication 04/11/2019    Class: Chronic  . Lumbar disc herniation 04/11/2019    Class: Chronic  . Status post lumbar spinal fusion 04/11/2019  . Hardware complicating wound infection ([place]) 01/31/2018  . Spondylolisthesis of lumbar region 12/30/2017  . S/P ankle fusion 10/20/2017  . Post-traumatic osteoarthritis, left ankle and foot 01/07/2017  . Primary osteoarthritis of right knee 05/27/2016  . GERD (gastroesophageal reflux disease) 07/09/2015  . Mild persistent asthma 07/09/2015  . Type 2 diabetes mellitus ([place]) 07/09/2015  .  Polyp of duodenum 06/04/2015  . Alcohol dependence ([place]) 02/25/2015  . Moderate episode of recurrent major depressive disorder ([place]) 02/25/2015  . Upper GI bleed 02/18/2015  . Benign essential HTN 02/18/2015  . Anemia of chronic disease 02/18/2015  . Pulmonary nodule 02/18/2015  . Cocaine abuse ([place]) 03/02/2014  . Posttraumatic stress disorder 03/02/2014  . History of CVA (cerebrovascular accident) 01/12/2014  . Schizophrenia ([place]) 01/12/2014  . Tobacco dependence syndrome 12/06/2013    Past Surgical History:  Procedure Laterality Date  . ANKLE ARTHROSCOPY Left 10/20/2017   Procedure: ANKLE ARTHROSCOPY;  Surgeon: Newt Minion, MD;  Location: [facility];  Service: Orthopedics;  Laterality: Left;  . ANKLE FUSION Left 10/20/2017  . ANKLE FUSION Left 10/20/2017   Procedure: LEFT ANKLE FUSION;  Surgeon: Newt Minion, MD;  Location: [facility];  Service: Orthopedics;  Laterality: Left;  . ESOPHAGOGASTRODUODENOSCOPY Left 12/05/2013   Procedure: ESOPHAGOGASTRODUODENOSCOPY (EGD);  Surgeon: Arta Silence, MD;  Location: [facility];  Service: Endoscopy;  Laterality: Left;  . ESOPHAGOGASTRODUODENOSCOPY (EGD) WITH PROPOFOL N/A 02/20/2015   Procedure: ESOPHAGOGASTRODUODENOSCOPY (EGD) WITH PROPOFOL;  Surgeon: Wilford Corner, MD;  Location: [facility];  Service: Endoscopy;  Laterality: N/A;  . HARDWARE REMOVAL Left 01/29/2018   Procedure: HARDWARE REMOVAL LEFT ANKLE;  Surgeon: Newt Minion, MD;  Location: [facility];  Service: Orthopedics;  Laterality: Left;  . JOINT REPLACEMENT    . TOTAL KNEE ARTHROPLASTY Right 07/21/2016   Procedure: TOTAL KNEE ARTHROPLASTY;  Surgeon: Meredith Pel, MD;  Location: [facility];  Service: Orthopedics;  Laterality: Right;  . UPPER GASTROINTESTINAL ENDOSCOPY         Family History  Adopted: Yes  Problem Relation Age of Onset  . Colon cancer Neg Hx   . Esophageal cancer Neg Hx   . Rectal cancer Neg Hx   . Stomach cancer Neg Hx     Social History   Tobacco Use  .  Smoking status: Heavy Tobacco Smoker    Packs/day: 1.50    Years: 38.00    Pack years: 57.00    Types: Cigarettes  . Smokeless tobacco: Never Used  . Tobacco comment: onset age 40; upto 1.5 ppd  Substance Use Topics  . Alcohol use: Not Currently    Alcohol/week: 3.0 standard drinks    Types: 3 Cans of beer per week    Comment: quit 2015; formerly up to 2 fifths/day  . Drug use: No    Home Medications Prior to Admission medications   Medication Sig Start Date End Date Taking? Authorizing Provider  albuterol (PROVENTIL HFA;VENTOLIN HFA) 108 (90 Base) MCG/ACT inhaler Inhale 1 puff into the lungs 2 (two) times daily as needed for wheezing or shortness of breath. Patient taking differently: Inhale 2 puffs into the lungs 2 (two) times daily as needed for wheezing or shortness of breath.  05/14/17   Diallo, Earna Coder, MD  amLODipine (NORVASC) 2.5 MG tablet TAKE 1 TABLET BY MOUTH ONCE DAILY. Patient taking differently: Take 2.5 mg by mouth daily.  08/12/18   Diallo, Earna Coder, MD  benztropine (COGENTIN) 1 MG tablet Take 1 mg by mouth daily.    [provider]  buPROPion (WELLBUTRIN XL) 300 MG 24 hr tablet Take 300 mg by mouth daily.    [provider]  cetirizine (ZYRTEC) 10 MG tablet TAKE 1 TABLET BY MOUTH ONCE DAILY. Patient taking differently: Take 10 mg by mouth daily. No Therapeutic Substitution 03/28/18   Diallo, Abdoulaye, MD  docusate sodium (COLACE) 100 MG capsule Take 1 capsule (100 mg total) by mouth 2 (two) times daily. 04/13/19   Jessy Oto, MD  gabapentin (NEURONTIN) 300 MG capsule TAKE 1 CAPSULE BY MOUTH 2 TIMES DAILY Patient taking differently: Take 300 mg by mouth 2 (two) times daily.  07/13/19   Jessy Oto, MD  ibuprofen (ADVIL) 800 MG tablet Take 800 mg by mouth every 6 (six) hours as needed for moderate pain.  06/13/19   [provider]  INVEGA TRINZA 819 MG/2.625ML SUSY Inject 819 mg into the skin every 3 (three) months. 09/08/17   [provider]  losartan (COZAAR) 100 MG tablet Take 100 mg by mouth daily. 07/18/19   [provider]  Melatonin 5 MG TABS Take 5 mg by mouth at bedtime. 02/28/19   [provider]  meloxicam (MOBIC) 15 MG tablet Take 15 mg by mouth daily. 06/26/19   [provider]  methocarbamol (ROBAXIN) 500 MG tablet Take 1 tablet (500 mg total) by mouth every 6 (six) hours as needed for muscle spasms. 04/13/19   Jessy Oto, MD  mirtazapine (REMERON) 15 MG tablet Take 15 mg by mouth at bedtime.    [provider]  Multiple Vitamins-Minerals (VITRUM SENIOR) TABS Take 1 tablet by mouth daily.  04/30/15   [provider]  omeprazole (PRILOSEC) 40 MG capsule TAKE 1 CAPSULE(40 MG) BY MOUTH DAILY Patient taking differently: Take 40 mg by mouth daily.  04/13/19   Jessy Oto,  MD  ondansetron (ZOFRAN ODT) 4 MG disintegrating tablet Take 1 tablet (4 mg total) by mouth every 8 (eight) hours as needed for nausea. 07/03/19   Noemi Chapel, MD  oxyCODONE (OXY IR/ROXICODONE) 5 MG immediate release tablet Take 1 tablet (5 mg total) by mouth 2 (two) times daily. 06/22/19   Jessy Oto, MD  sucralfate (CARAFATE) 1 g tablet TAKE 1 TABLET FOUR TIMES DAILY FOR ACID REFLUX. Patient taking differently: Take 1 g by mouth 4 (four) times daily.  06/21/17   Diallo, Earna Coder, MD  SUMAtriptan (IMITREX) 50 MG tablet Take 50 mg by mouth once. May repeat in 2 hours if needed. 06/20/19   [provider]    Allergies    Patient has no known allergies.  Review of Systems   Review of Systems  Constitutional: Positive for activity change.  Respiratory: Negative for shortness of breath.   Cardiovascular: Negative for chest pain.  Gastrointestinal: Negative for nausea and vomiting.  Genitourinary: Negative for dysuria.  All other systems reviewed and are negative.   Physical Exam Updated Vital Signs BP 123/84   Pulse (!) 119   Temp 99.2 F (37.3 C) (Oral)   Resp 14   SpO2 97%    Physical Exam Vitals and nursing note reviewed.  Constitutional:      Appearance: He is well-developed.  HENT:     Head: Atraumatic.     Mouth/Throat:     Comments: Pupils are 1 mm and equal Eyes:     Extraocular Movements: Extraocular movements intact.     Pupils: Pupils are equal, round, and reactive to light.  Cardiovascular:     Rate and Rhythm: Normal rate.  Pulmonary:     Effort: Pulmonary effort is normal.  Musculoskeletal:     Cervical back: Neck supple.  Skin:    General: Skin is warm.  Neurological:     Mental Status: He is alert and oriented to person, place, and time.     Comments: Gross sensory exam for upper and lower extremity exam is normal.  Patient has equal upper and lower extremity strength as well.     ED Results / Procedures / Treatments   Labs (all labs ordered are listed, but only abnormal results are displayed) Labs Reviewed  COMPREHENSIVE METABOLIC PANEL - Abnormal; Notable for the following components:      Result Value   CO2 20 (*)    Creatinine, Ser 1.50 (*)    Calcium 8.4 (*)    Albumin 3.3 (*)    Alkaline Phosphatase 169 (*)    GFR calc non Af Amer 52 (*)    All other components within normal limits  CBC WITH DIFFERENTIAL/PLATELET - Abnormal; Notable for the following components:   WBC 12.9 (*)    RBC 4.21 (*)    Hemoglobin 10.1 (*)    HCT 33.1 (*)    MCV 78.6 (*)    MCH 24.0 (*)    RDW 15.9 (*)    Platelets 582 (*)    Neutro Abs 11.5 (*)    Abs Immature Granulocytes 0.08 (*)    All other components within normal limits  CK - Abnormal; Notable for the following components:   Total CK 475 (*)    All other components within normal limits  ETHANOL  URINALYSIS, ROUTINE W REFLEX MICROSCOPIC  RAPID URINE DRUG SCREEN, HOSP PERFORMED    EKG EKG Interpretation  Date/Time:  Tuesday July 25 2019 14:57:59 EDT Ventricular Rate:  147 PR Interval:  QRS Duration: 82 QT Interval:  288 QTC Calculation: 451 R Axis:   19 Text  Interpretation: Sinus tachycardia Abnormal R-wave progression, early transition No acute changes profound tachycardia Confirmed by Varney Biles [phone]) on 07/25/2019 4:23:19 PM   Radiology DG Chest Port 1 View  Result Date: 07/25/2019 CLINICAL DATA:  Tachycardia and shortness of breath EXAM: PORTABLE CHEST 1 VIEW COMPARISON:  04/07/2019 FINDINGS: Cardiac shadow is mildly prominent but accentuated by the portable technique. Lungs are hypoinflated with crowding of the vascular markings. Increased vascular congestion and parenchymal edema is noted. No bony abnormality is seen. IMPRESSION: Changes consistent with mild CHF. Electronically Signed   By: Inez Catalina M.D.   On: 07/25/2019 17:22    Procedures .Critical Care Performed by: Varney Biles, MD Authorized by: Varney Biles, MD   Critical care provider statement:    Critical care time (minutes):  45   Critical care was necessary to treat or prevent imminent or life-threatening deterioration of the following conditions:  CNS failure or compromise and circulatory failure (Tachycardia with altered mental status)   Critical care was time spent personally by me on the following activities:  Discussions with consultants, evaluation of patient's response to treatment, examination of patient, ordering and performing treatments and interventions, ordering and review of laboratory studies, ordering and review of radiographic studies, pulse oximetry, re-evaluation of patient's condition, obtaining history from patient or surrogate and review of old charts   (including critical care time)  Medications Ordered in ED Medications  lactated ringers bolus 1,000 mL (has no administration in time range)  lactated ringers bolus 1,000 mL (1,000 mLs Intravenous New Bag/Given 07/25/19 1623)    ED Course  I have reviewed the triage vital signs and the nursing notes.  Pertinent labs & imaging results that were available during my care of the patient were  reviewed by me and considered in my medical decision making (see chart for details).    MDM Rules/Calculators/A&P                      DDx includes: ICH / Stroke ACS Sepsis syndrome Infection - UTI/Pneumonia Encephalopathy  Electrolyte abnormality Drug overdose DKA Metabolic disorders including thyroid disorders, adrenal insufficiency Cancer of unknown origin / paraneoplastic process Drug withdrawal  Patient comes in a chief complaint of altered mental status. Patient is not providing meaningful history to [place].  He has history of strokes however there is no focal neurologic deficit.  He has pinpoint pupils and he is on pain medications for his chronic pain -therefore possibility of overdose is present.  Patient also admits to drinking heavily, therefore alcohol intoxication is possible.  At this time without any focal neurologic deficits will focus on basic labs and electrolyte evaluation.  I called family on 2 different occasions without any response.  Dr. Rudene Christians is taken over.  Labs are reassuring.  Patient's heart rate has come down from 150s to 115 with fluid infusion.  He has been reassessed and he has become more alert.  Other possibility includes seizure per Dr. Eulis Foster -which is also good thought.  I have tried to call patient's girlfriend again, however the call continues to go to the voicemail.  Patient will be monitored closely.    Final Clinical Impression(s) / ED Diagnoses Final diagnoses:  Altered mental status, unspecified altered mental status type    Rx / DC Orders ED Discharge Orders    None       Varney Biles, MD 07/25/19 1728

## 2019-07-25 NOTE — ED Notes (Signed)
Pt awake, alert, speaking. Answering questions appropriately. Swallow screen obt a/o. Pt passed without distress. Water given. Cont to monitor.

## 2019-07-26 ENCOUNTER — Inpatient Hospital Stay (HOSPITAL_COMMUNITY): Payer: Medicare Other

## 2019-07-26 ENCOUNTER — Other Ambulatory Visit: Payer: Self-pay

## 2019-07-26 ENCOUNTER — Encounter (HOSPITAL_COMMUNITY): Payer: Self-pay | Admitting: Internal Medicine

## 2019-07-26 DIAGNOSIS — E871 Hypo-osmolality and hyponatremia: Secondary | ICD-10-CM | POA: Diagnosis not present

## 2019-07-26 DIAGNOSIS — G92 Toxic encephalopathy: Secondary | ICD-10-CM | POA: Diagnosis present

## 2019-07-26 DIAGNOSIS — J811 Chronic pulmonary edema: Secondary | ICD-10-CM | POA: Diagnosis present

## 2019-07-26 DIAGNOSIS — E1169 Type 2 diabetes mellitus with other specified complication: Secondary | ICD-10-CM | POA: Diagnosis not present

## 2019-07-26 DIAGNOSIS — E538 Deficiency of other specified B group vitamins: Secondary | ICD-10-CM | POA: Diagnosis not present

## 2019-07-26 DIAGNOSIS — R112 Nausea with vomiting, unspecified: Secondary | ICD-10-CM | POA: Diagnosis not present

## 2019-07-26 DIAGNOSIS — R410 Disorientation, unspecified: Secondary | ICD-10-CM | POA: Diagnosis not present

## 2019-07-26 DIAGNOSIS — Z981 Arthrodesis status: Secondary | ICD-10-CM | POA: Diagnosis not present

## 2019-07-26 DIAGNOSIS — M546 Pain in thoracic spine: Secondary | ICD-10-CM | POA: Diagnosis not present

## 2019-07-26 DIAGNOSIS — F209 Schizophrenia, unspecified: Secondary | ICD-10-CM | POA: Diagnosis present

## 2019-07-26 DIAGNOSIS — R6510 Systemic inflammatory response syndrome (SIRS) of non-infectious origin without acute organ dysfunction: Secondary | ICD-10-CM | POA: Diagnosis present

## 2019-07-26 DIAGNOSIS — F17210 Nicotine dependence, cigarettes, uncomplicated: Secondary | ICD-10-CM | POA: Diagnosis not present

## 2019-07-26 DIAGNOSIS — F19130 Other psychoactive substance abuse with withdrawal, uncomplicated: Secondary | ICD-10-CM | POA: Diagnosis present

## 2019-07-26 DIAGNOSIS — N179 Acute kidney failure, unspecified: Secondary | ICD-10-CM | POA: Diagnosis not present

## 2019-07-26 DIAGNOSIS — G009 Bacterial meningitis, unspecified: Secondary | ICD-10-CM | POA: Diagnosis present

## 2019-07-26 DIAGNOSIS — K567 Ileus, unspecified: Secondary | ICD-10-CM | POA: Diagnosis not present

## 2019-07-26 DIAGNOSIS — I361 Nonrheumatic tricuspid (valve) insufficiency: Secondary | ICD-10-CM | POA: Diagnosis not present

## 2019-07-26 DIAGNOSIS — I69322 Dysarthria following cerebral infarction: Secondary | ICD-10-CM | POA: Diagnosis not present

## 2019-07-26 DIAGNOSIS — T50901D Poisoning by unspecified drugs, medicaments and biological substances, accidental (unintentional), subsequent encounter: Secondary | ICD-10-CM | POA: Diagnosis not present

## 2019-07-26 DIAGNOSIS — I10 Essential (primary) hypertension: Secondary | ICD-10-CM | POA: Diagnosis present

## 2019-07-26 DIAGNOSIS — I69351 Hemiplegia and hemiparesis following cerebral infarction affecting right dominant side: Secondary | ICD-10-CM | POA: Diagnosis not present

## 2019-07-26 DIAGNOSIS — J4530 Mild persistent asthma, uncomplicated: Secondary | ICD-10-CM | POA: Diagnosis present

## 2019-07-26 DIAGNOSIS — I69328 Other speech and language deficits following cerebral infarction: Secondary | ICD-10-CM | POA: Diagnosis not present

## 2019-07-26 DIAGNOSIS — K219 Gastro-esophageal reflux disease without esophagitis: Secondary | ICD-10-CM | POA: Diagnosis present

## 2019-07-26 DIAGNOSIS — Z20822 Contact with and (suspected) exposure to covid-19: Secondary | ICD-10-CM | POA: Diagnosis present

## 2019-07-26 DIAGNOSIS — G9340 Encephalopathy, unspecified: Secondary | ICD-10-CM | POA: Diagnosis not present

## 2019-07-26 DIAGNOSIS — Z96651 Presence of right artificial knee joint: Secondary | ICD-10-CM | POA: Diagnosis present

## 2019-07-26 DIAGNOSIS — M4646 Discitis, unspecified, lumbar region: Secondary | ICD-10-CM | POA: Diagnosis not present

## 2019-07-26 DIAGNOSIS — G894 Chronic pain syndrome: Secondary | ICD-10-CM | POA: Diagnosis present

## 2019-07-26 DIAGNOSIS — G928 Other toxic encephalopathy: Secondary | ICD-10-CM | POA: Diagnosis present

## 2019-07-26 DIAGNOSIS — M4626 Osteomyelitis of vertebra, lumbar region: Secondary | ICD-10-CM | POA: Diagnosis present

## 2019-07-26 DIAGNOSIS — T50901A Poisoning by unspecified drugs, medicaments and biological substances, accidental (unintentional), initial encounter: Secondary | ICD-10-CM | POA: Diagnosis present

## 2019-07-26 DIAGNOSIS — M4624 Osteomyelitis of vertebra, thoracic region: Secondary | ICD-10-CM | POA: Diagnosis present

## 2019-07-26 DIAGNOSIS — I959 Hypotension, unspecified: Secondary | ICD-10-CM | POA: Diagnosis present

## 2019-07-26 DIAGNOSIS — F10231 Alcohol dependence with withdrawal delirium: Secondary | ICD-10-CM | POA: Diagnosis present

## 2019-07-26 DIAGNOSIS — M1990 Unspecified osteoarthritis, unspecified site: Secondary | ICD-10-CM | POA: Diagnosis present

## 2019-07-26 DIAGNOSIS — R4182 Altered mental status, unspecified: Secondary | ICD-10-CM | POA: Diagnosis present

## 2019-07-26 LAB — BASIC METABOLIC PANEL
Anion gap: 8 (ref 5–15)
BUN: 6 mg/dL (ref 6–20)
CO2: 25 mmol/L (ref 22–32)
Calcium: 8.2 mg/dL — ABNORMAL LOW (ref 8.9–10.3)
Chloride: 98 mmol/L (ref 98–111)
Creatinine, Ser: 0.8 mg/dL (ref 0.61–1.24)
GFR calc Af Amer: >60 mL/min (ref >60)
GFR calc non Af Amer: >60 mL/min (ref >60)
Glucose, Bld: 93 mg/dL (ref 70–99)
Potassium: 3.1 mmol/L — ABNORMAL LOW (ref 3.5–5.1)
Sodium: 131 mmol/L — ABNORMAL LOW (ref 135–145)

## 2019-07-26 LAB — CBC WITH DIFFERENTIAL/PLATELET
Abs Immature Granulocytes: 0.03 10*3/uL (ref 0.00–0.07)
Basophils Absolute: 0 10*3/uL (ref 0.0–0.1)
Basophils Relative: 0 %
Eosinophils Absolute: 0.1 10*3/uL (ref 0.0–0.5)
Eosinophils Relative: 1 %
HCT: 30.2 % — ABNORMAL LOW (ref 39.0–52.0)
Hemoglobin: 9.1 g/dL — ABNORMAL LOW (ref 13.0–17.0)
Immature Granulocytes: 0 %
Lymphocytes Relative: 16 %
Lymphs Abs: 1.4 10*3/uL (ref 0.7–4.0)
MCH: 23.7 pg — ABNORMAL LOW (ref 26.0–34.0)
MCHC: 30.1 g/dL (ref 30.0–36.0)
MCV: 78.6 fL — ABNORMAL LOW (ref 80.0–100.0)
Monocytes Absolute: 0.8 10*3/uL (ref 0.1–1.0)
Monocytes Relative: 9 %
Neutro Abs: 6.8 10*3/uL (ref 1.7–7.7)
Neutrophils Relative %: 74 %
Platelets: 515 10*3/uL — ABNORMAL HIGH (ref 150–400)
RBC: 3.84 MIL/uL — ABNORMAL LOW (ref 4.22–5.81)
RDW: 16 % — ABNORMAL HIGH (ref 11.5–15.5)
WBC: 9.2 10*3/uL (ref 4.0–10.5)
nRBC: 0 % (ref 0.0–0.2)

## 2019-07-26 LAB — LACTIC ACID, PLASMA: Lactic Acid, Venous: 1.1 mmol/L (ref 0.5–1.9)

## 2019-07-26 LAB — RESPIRATORY PANEL BY RT PCR (FLU A&B, COVID)
Influenza A by PCR: NEGATIVE
Influenza B by PCR: NEGATIVE
SARS Coronavirus 2 by RT PCR: NEGATIVE

## 2019-07-26 LAB — URINE CULTURE: Culture: NO GROWTH

## 2019-07-26 LAB — RPR: RPR Ser Ql: NONREACTIVE

## 2019-07-26 LAB — PHOSPHORUS: Phosphorus: 3.3 mg/dL (ref 2.5–4.6)

## 2019-07-26 LAB — VITAMIN B12: Vitamin B-12: 147 pg/mL — ABNORMAL LOW (ref 180–914)

## 2019-07-26 LAB — MAGNESIUM: Magnesium: 1.7 mg/dL (ref 1.7–2.4)

## 2019-07-26 LAB — TROPONIN I (HIGH SENSITIVITY): Troponin I (High Sensitivity): 24 ng/L — ABNORMAL HIGH (ref <18)

## 2019-07-26 LAB — AMMONIA: Ammonia: 31 umol/L (ref 9–35)

## 2019-07-26 LAB — BRAIN NATRIURETIC PEPTIDE: B Natriuretic Peptide: 49.1 pg/mL (ref 0.0–100.0)

## 2019-07-26 LAB — C-REACTIVE PROTEIN: CRP: 11.6 mg/dL — ABNORMAL HIGH (ref <1.0)

## 2019-07-26 LAB — HIV ANTIBODY (ROUTINE TESTING W REFLEX): HIV Screen 4th Generation wRfx: NONREACTIVE

## 2019-07-26 LAB — TSH: TSH: 3.478 u[IU]/mL (ref 0.350–4.500)

## 2019-07-26 MED ORDER — ALBUTEROL SULFATE (2.5 MG/3ML) 0.083% IN NEBU: 2.5000 mg | INHALATION_SOLUTION | Freq: Two times a day (BID) | RESPIRATORY_TRACT | Status: DC | PRN

## 2019-07-26 MED ORDER — ONDANSETRON HCL 4 MG PO TABS
4.0000 mg | ORAL_TABLET | Freq: Four times a day (QID) | ORAL | Status: DC | PRN
  Administered 2019-08-02 – 2019-08-09 (×6): 4 mg via ORAL
  Filled 2019-07-26 (×6): qty 1

## 2019-07-26 MED ORDER — SUCRALFATE 1 G PO TABS
1.0000 g | ORAL_TABLET | Freq: Four times a day (QID) | ORAL | Status: DC
  Administered 2019-07-26 – 2019-07-27 (×5): 1 g via ORAL
  Filled 2019-07-26 (×6): qty 1

## 2019-07-26 MED ORDER — DOCUSATE SODIUM 100 MG PO CAPS
100.0000 mg | ORAL_CAPSULE | Freq: Two times a day (BID) | ORAL | Status: DC
  Administered 2019-07-26 – 2019-07-27 (×3): 100 mg via ORAL
  Filled 2019-07-26 (×4): qty 1

## 2019-07-26 MED ORDER — METHOCARBAMOL 500 MG PO TABS: 500.0000 mg | ORAL_TABLET | Freq: Four times a day (QID) | ORAL | Status: DC | PRN

## 2019-07-26 MED ORDER — LACTATED RINGERS IV SOLN: INTRAVENOUS | Status: AC

## 2019-07-26 MED ORDER — SODIUM CHLORIDE 0.9 % IV SOLN
100.0000 mg | Freq: Two times a day (BID) | INTRAVENOUS | Status: DC
  Administered 2019-07-26: 100 mg via INTRAVENOUS
  Filled 2019-07-26 (×2): qty 100

## 2019-07-26 MED ORDER — THIAMINE HCL 100 MG/ML IJ SOLN
100.0000 mg | Freq: Every day | INTRAMUSCULAR | Status: DC
  Administered 2019-07-28 – 2019-07-31 (×2): 100 mg via INTRAVENOUS
  Filled 2019-07-26 (×3): qty 2

## 2019-07-26 MED ORDER — LORATADINE 10 MG PO TABS
10.0000 mg | ORAL_TABLET | Freq: Every day | ORAL | Status: DC
  Administered 2019-07-26: 10 mg via ORAL
  Filled 2019-07-26: qty 1

## 2019-07-26 MED ORDER — ACETAMINOPHEN 325 MG PO TABS
650.0000 mg | ORAL_TABLET | Freq: Four times a day (QID) | ORAL | Status: DC | PRN
  Administered 2019-07-26 – 2019-08-01 (×5): 650 mg via ORAL
  Filled 2019-07-26 (×5): qty 2

## 2019-07-26 MED ORDER — THIAMINE HCL 100 MG PO TABS
100.0000 mg | ORAL_TABLET | Freq: Every day | ORAL | Status: DC
  Administered 2019-07-26 – 2019-08-04 (×7): 100 mg via ORAL
  Filled 2019-07-26 (×9): qty 1

## 2019-07-26 MED ORDER — PANTOPRAZOLE SODIUM 40 MG PO TBEC
40.0000 mg | DELAYED_RELEASE_TABLET | Freq: Every day | ORAL | Status: DC
  Administered 2019-07-26 – 2019-07-27 (×2): 40 mg via ORAL
  Filled 2019-07-26 (×2): qty 1

## 2019-07-26 MED ORDER — GABAPENTIN 300 MG PO CAPS
300.0000 mg | ORAL_CAPSULE | Freq: Two times a day (BID) | ORAL | Status: DC
  Administered 2019-07-26: 300 mg via ORAL
  Filled 2019-07-26: qty 1

## 2019-07-26 MED ORDER — LOSARTAN POTASSIUM 50 MG PO TABS
100.0000 mg | ORAL_TABLET | Freq: Every day | ORAL | Status: DC
  Administered 2019-07-26 – 2019-07-27 (×2): 100 mg via ORAL
  Filled 2019-07-26 (×2): qty 2

## 2019-07-26 MED ORDER — ALBUTEROL SULFATE HFA 108 (90 BASE) MCG/ACT IN AERS
2.0000 | INHALATION_SPRAY | Freq: Two times a day (BID) | RESPIRATORY_TRACT | Status: DC | PRN
  Filled 2019-07-26: qty 6.7

## 2019-07-26 MED ORDER — LORAZEPAM 1 MG PO TABS
1.0000 mg | ORAL_TABLET | ORAL | Status: DC | PRN
  Administered 2019-07-26: 1 mg via ORAL
  Administered 2019-07-27: 2 mg via ORAL
  Filled 2019-07-26: qty 1

## 2019-07-26 MED ORDER — VITAMIN B-12 1000 MCG PO TABS
1000.0000 ug | ORAL_TABLET | Freq: Every day | ORAL | Status: DC
  Administered 2019-07-26 – 2019-07-27 (×2): 1000 ug via ORAL
  Filled 2019-07-26 (×2): qty 1

## 2019-07-26 MED ORDER — ACETAMINOPHEN 650 MG RE SUPP: 650.0000 mg | Freq: Four times a day (QID) | RECTAL | Status: DC | PRN

## 2019-07-26 MED ORDER — ADULT MULTIVITAMIN W/MINERALS CH
1.0000 | ORAL_TABLET | Freq: Every day | ORAL | Status: DC
  Administered 2019-07-26 – 2019-07-27 (×2): 1 via ORAL
  Filled 2019-07-26 (×2): qty 1

## 2019-07-26 MED ORDER — ONDANSETRON HCL 4 MG/2ML IJ SOLN
4.0000 mg | Freq: Four times a day (QID) | INTRAMUSCULAR | Status: DC | PRN
  Administered 2019-08-01 – 2019-08-16 (×6): 4 mg via INTRAVENOUS
  Filled 2019-07-26 (×6): qty 2

## 2019-07-26 MED ORDER — BUPROPION HCL ER (XL) 150 MG PO TB24
300.0000 mg | ORAL_TABLET | Freq: Every day | ORAL | Status: DC
  Administered 2019-07-26: 09:00:00 300 mg via ORAL
  Filled 2019-07-26: qty 2

## 2019-07-26 MED ORDER — POLYETHYLENE GLYCOL 3350 17 G PO PACK: 17.0000 g | PACK | Freq: Every day | ORAL | Status: DC | PRN

## 2019-07-26 MED ORDER — ENOXAPARIN SODIUM 40 MG/0.4ML ~~LOC~~ SOLN
40.0000 mg | SUBCUTANEOUS | Status: DC
  Administered 2019-07-26 – 2019-07-29 (×4): 40 mg via SUBCUTANEOUS
  Filled 2019-07-26 (×4): qty 0.4

## 2019-07-26 MED ORDER — MIRTAZAPINE 7.5 MG PO TABS: 15.0000 mg | ORAL_TABLET | Freq: Every day | ORAL | Status: DC

## 2019-07-26 MED ORDER — BENZTROPINE MESYLATE 1 MG PO TABS
1.0000 mg | ORAL_TABLET | Freq: Every day | ORAL | Status: DC
  Administered 2019-07-26: 1 mg via ORAL
  Filled 2019-07-26: qty 1

## 2019-07-26 MED ORDER — LACTATED RINGERS IV BOLUS
1000.0000 mL | Freq: Once | INTRAVENOUS | Status: AC
  Administered 2019-07-26: 1000 mL via INTRAVENOUS

## 2019-07-26 MED ORDER — FOLIC ACID 1 MG PO TABS
1.0000 mg | ORAL_TABLET | Freq: Every day | ORAL | Status: DC
  Administered 2019-07-26 – 2019-07-27 (×2): 1 mg via ORAL
  Filled 2019-07-26 (×2): qty 1

## 2019-07-26 MED ORDER — AMLODIPINE BESYLATE 5 MG PO TABS
2.5000 mg | ORAL_TABLET | Freq: Every day | ORAL | Status: DC
  Administered 2019-07-26 – 2019-07-27 (×2): 2.5 mg via ORAL
  Filled 2019-07-26 (×2): qty 1

## 2019-07-26 MED ORDER — LORAZEPAM 2 MG/ML IJ SOLN
1.0000 mg | INTRAMUSCULAR | Status: DC | PRN
  Administered 2019-07-26: 1 mg via INTRAVENOUS
  Administered 2019-07-26 (×3): 2 mg via INTRAVENOUS
  Administered 2019-07-27: 13:00:00 3 mg via INTRAVENOUS
  Administered 2019-07-27 (×3): 2 mg via INTRAVENOUS
  Filled 2019-07-26 (×7): qty 1
  Filled 2019-07-26 (×2): qty 2
  Filled 2019-07-26 (×2): qty 1

## 2019-07-26 NOTE — ED Notes (Signed)
Gave patient his dinner tray however he is sleep at this time

## 2019-07-26 NOTE — Progress Notes (Addendum)
PROGRESS NOTE  [name]  DOB: Apr 27, 1965  PCP: [name], MD FIE:[id number]  DOA: 07/25/2019  LOS: 0 days   Chief Complaint  Patient presents with  . Tachycardia  . Hypotension   Brief narrative: [name] is a 54 y.o. male with PMH of HTN, prediabetes, stroke with residual dysarthria, GERD, schizophrenia, anxiety/depression, polysubstance abuse including tobacco, alcohol, cocaine, opiate, benzodiazepine, amphetamine. Patient presented to the ED on 07/25/2019 for confusion, lethargy. Per chart, patient had a party at his house in the night on 4/26.Patient was using multiple substances including alcohol, PCP, heroin and cocaine. Patient states the last thing he remembers is holding a beer.    Per ED note, patient was found sitting on sidewalk panting heavily.  EMS noted his heart rate elevated to 160, blood pressure in 70s.  IV access was obtained, 500 mL of normal saline was given and patient was brought to the ED. In the ED, patient was altered, arousable to voice, mumbling with closed eyes. Temperature 99.2, heart rate elevated to 146, blood pressure was also elevated mostly to 140s. Labs showed creatinine elevated to 1.5, baseline normal, WBC elevated to 12.9, hemoglobin 10.1, platelets 582. Urine drug screen is positive for amphetamine, benzodiazepine and opiate. CT head normal. He received several liters of isotonic fluids. He was admitted to hospitalist service for further evaluation and management.  Subjective: Patient was seen and examined this morning.  Middle-aged Caucasian male, sleeping, arousable to verbal command.  Falls asleep week.  Not in pain or distress at this time.  Assessment/Plan: Toxic metabolic encephalopathy Polysubstance abuse -Urine drug screen positive for amphetamine, benzodiazepine, opiate. -Continue IV hydration. -Expect improvement in mental status once the effect of the drugs wear off.  -Continue to monitor on telemetry.  QTC  451 ms on EKG from 4/27. -Needs to be counseled once mental status improves  Chronic alcoholism High risk of alcohol withdrawal -Continue CIWA protocol.  Acute kidney injury -Creatinine was elevated to 1.54, baseline normal -Improved to normal this morning with IV fluids.  SIRS -Tachycardic, mild leukocytosis. -Likely secondary to drug abuse. I do not see any evidence of infection at this time. -No need of antibiotics. -Continue to monitor temperature and WBC trend.  Sinus tachycardia/hypertension -Patient continues have persistent sinus tachycardia and elevated blood pressure. -Not in alcohol withdrawal.  Probably related to the effect of drugs. -Not on any beta-blocker or calcium channel blocker at home.  Amlodipine and losartan resumed. -Continue to monitor heart rate and blood pressure.  Chronic pain syndrome Anxiety/depression -Home meds include multiple mood altering medicines including benztropine, bupropion, Zyrtec, Remeron, Imitrex, Neurontin, Ibuprofen, meloxicam, Robaxin, Roxicodone,  Invega shots every 3 months -Because of significant altered mental status, I would hold all of them for now.  Need to be gradually resumed. -Of note, patient has chronic back pain, takes opioids at home.  He had back surgery in January 2021.  Lately, he has been complaining of incredible back pain pain and hallucinating as well.    Vitamin B12 deficiency -Vitamin B12 level low at 147.  Start on B12 supplement.  GERD Chronic anemia -Continue Prilosec, Carafate -Continue to monitor hemoglobin.  Mobility: Encourage ambulation once mental status improves Code Status:  Full code  DVT prophylaxis:  Lovenox subcu Antimicrobials:  Stop IV antibiotics. Fluid: LR at 125 mL/h to continue Diet: Cardiac diet  Consultants: None Family Communication:  Family not at bedside  Status is: Observation  The patient remains OBS appropriate and will d/c before 2  midnights.  If patient's mental  status improves by the afternoon, we may be able to discharge him home today.  If not, he may need further overnight monitoring and switch to inpatient status.  Dispo: The patient is from: Home              Anticipated d/c is to: Home              Anticipated d/c date is: 1 day              Patient currently is not medically stable to d/c.   Antimicrobials: Anti-infectives (From admission, onward)   Start     Dose/Rate Route Frequency Ordered Stop   07/26/19 0300  doxycycline (VIBRAMYCIN) 100 mg in sodium chloride 0.9 % 250 mL IVPB  Status:  Discontinued     100 mg 125 mL/hr over 120 Minutes Intravenous Every 12 hours 07/26/19 0209 07/26/19 1001   07/25/19 2345  cefTRIAXone (ROCEPHIN) 1 g in sodium chloride 0.9 % 100 mL IVPB     1 g 200 mL/hr over 30 Minutes Intravenous  Once 07/25/19 2343 07/26/19 0141        Code Status: Full Code   Diet Order            Diet 2 gram sodium Room service appropriate? Yes; Fluid consistency: Thin  Diet effective now              Infusions:  . lactated ringers 125 mL/hr at 07/26/19 0220    Scheduled Meds: . amLODipine  2.5 mg Oral Daily  . docusate sodium  100 mg Oral BID  . enoxaparin (LOVENOX) injection  40 mg Subcutaneous Q24H  . folic acid  1 mg Oral Daily  . losartan  100 mg Oral Daily  . multivitamin with minerals  1 tablet Oral Daily  . pantoprazole  40 mg Oral Daily  . sucralfate  1 g Oral QID  . thiamine  100 mg Oral Daily   Or  . thiamine  100 mg Intravenous Daily  . vitamin B-12  1,000 mcg Oral Daily    PRN meds: acetaminophen **OR** acetaminophen, albuterol, LORazepam **OR** LORazepam, ondansetron **OR** ondansetron (ZOFRAN) IV, polyethylene glycol   Objective: Vitals:   07/26/19 0930 07/26/19 1000  BP: (!) 141/103 (!) 154/98  Pulse: (!) 109 (!) 110  Resp:    Temp:    SpO2: 96% 98%    Intake/Output Summary (Last 24 hours) at 07/26/2019 1017 Last data filed at 07/26/2019 0440 Gross per 24 hour  Intake 4339.51  ml  Output --  Net 4339.51 ml   There were no vitals filed for this visit. Weight change:  There is no height or weight on file to calculate BMI.   Physical Exam: General exam: Appears calm and comfortable.  Not in physical distress Skin: No rashes, lesions or ulcers. HEENT: Atraumatic, normocephalic, supple neck, no obvious bleeding Lungs: Clear to auscultation bilaterally CVS: Tachycardic, regular rhythm, no murmur GI/Abd soft, nontender, nondistended, bowel sound present CNS: Opens eyes on verbal command, falls back asleep quick Psychiatry: Unable to examine mood Extremities: No pedal edema, no calf tenderness  Data Review: I have personally reviewed the laboratory data and studies available.  CBC: Recent Labs  Lab 07/25/19 1501 07/26/19 0819  WBC 12.9* 9.2  NEUTROABS 11.5* 6.8  HGB 10.1* 9.1*  HCT 33.1* 30.2*  MCV 78.6* 78.6*  PLT 582* 515*    Basic Metabolic Panel: Recent Labs  Lab 07/25/19 1501 07/26/19 0819  NA 137 131*  K 3.5 3.1*  CL 105 98  CO2 20* 25  GLUCOSE 99 93  BUN 9 6  CREATININE 1.50* 0.80  CALCIUM 8.4* 8.2*  MG  --  1.7  PHOS  --  3.3    Liver Function Tests: Recent Labs  Lab 07/25/19 1501  AST 28  ALT 13  ALKPHOS 169*  BILITOT 0.5  PROT 7.3  ALBUMIN 3.3*   No results for input(s): LIPASE, AMYLASE in the last 168 hours. Recent Labs  Lab 07/25/19 2345  AMMONIA 31    Cardiac Enzymes: Recent Labs  Lab 07/25/19 1501  CKTOTAL 475*    BNP (last 3 results) Recent Labs    07/25/19 2346  BNP 49.1   Lipase     Component Value Date/Time   LIPASE 12 07/03/2019 0326     Urinalysis    Component Value Date/Time   COLORURINE YELLOW 07/25/2019 1805   APPEARANCEUR HAZY (A) 07/25/2019 1805   LABSPEC 1.011 07/25/2019 1805   PHURINE 6.0 07/25/2019 1805   GLUCOSEU NEGATIVE 07/25/2019 1805   HGBUR SMALL (A) 07/25/2019 1805   BILIRUBINUR NEGATIVE 07/25/2019 1805   BILIRUBINUR negative 04/10/2016 1702   KETONESUR NEGATIVE  07/25/2019 1805   PROTEINUR 30 (A) 07/25/2019 1805   UROBILINOGEN 0.2 04/10/2016 1702   UROBILINOGEN 0.2 12/04/2013 1044   NITRITE NEGATIVE 07/25/2019 1805   LEUKOCYTESUR SMALL (A) 07/25/2019 1805     Drugs of Abuse     Component Value Date/Time   LABOPIA POSITIVE (A) 07/25/2019 1805   COCAINSCRNUR NONE DETECTED 07/25/2019 1805   LABBENZ POSITIVE (A) 07/25/2019 1805   AMPHETMU POSITIVE (A) 07/25/2019 1805   THCU NONE DETECTED 07/25/2019 1805   LABBARB NONE DETECTED 07/25/2019 1805     Signed, Lorin Glass, MD [facility] Pager: [phone] (Secure Chat preferred). 07/26/2019

## 2019-07-26 NOTE — Progress Notes (Signed)
   07/26/19 1902  Assess: MEWS Score  Temp 99.8 F (37.7 C)  BP (!) 158/108  Pulse Rate (!) 117  Resp (!) 21  SpO2 96 %  O2 Device Room Air  Assess: MEWS Score  MEWS Temp 0  MEWS Systolic 0  MEWS Pulse 2  MEWS RR 1  MEWS LOC 0  MEWS Score 3  MEWS Score Color Yellow  Assess: if the MEWS score is Yellow or Red  Were vital signs taken at a resting state? Yes  Focused Assessment Documented focused assessment  Early Detection of Sepsis Score *See Row Information* Low  MEWS guidelines implemented *See Row Information* Yes  Treat  MEWS Interventions Administered prn meds/treatments  Take Vital Signs  Increase Vital Sign Frequency  Yellow: Q 2hr X 2 then Q 4hr X 2, if remains yellow, continue Q 4hrs  Escalate  MEWS: Escalate Yellow: discuss with charge nurse/RN and consider discussing with provider and RRT  Notify: Charge Nurse/RN  Name of Charge Nurse/RN Notified Tom  Date Charge Nurse/RN Notified 07/26/19  Time Charge Nurse/RN Notified 1910  Document  Progress note created (see row info) Yes  Patient received from ED.  Patient is A & O x 1-2 and lethargic.  Report given to Tom, night shift nurse. Plan to do CIWA and medicate and observe patient.

## 2019-07-26 NOTE — H&P (Addendum)
History and Physical    [name]:[id number] DOB: 14-Jun-1965 DOA: 07/25/2019  PCP: [name], MD  Patient coming from: Home via EMS   Chief Complaint:  Chief Complaint  Patient presents with  . Tachycardia  . Hypotension     HPI:    54 year old male with past medical history of nicotine dependence, polysubstance abuse, gastroesophageal reflux disease, schizophrenia, asthma, previous CVA who presents to [facility] emergency department via EMS due to concerns for confusion and lethargy by the patient's girlfriend.  Patient is an extremely poor historian due to current lethargy and confusion.  Patient's girlfriend has been extremely difficult to get a hold of although the emergency department provider was able to speak to her once.  Upon awakening in the emergency department he reports having had a party at his house last night.  Patient reports taking PCP, heroin and cocaine as well as drinking alcohol.  Patient states the last thing he remembers is holding a beer.  Patient is unable to provide further detail.  Patient denies shortness of breath, cough, acute new pain, diarrhea, abdominal pain, fevers or sick contacts.  Upon evaluation in the emergency department patient has received several liters of isotonic fluids.  Patient has been found to be exhibiting SIRS on arrival including leukocytosis and tachycardia.  Analysis has been performed and is relatively unremarkable.  Chest x-ray reveals no evidence of focal infiltrate.  Considering SIRS urinalysis has been performed and is unremarkable.  X-ray reveals patchy bilateral infiltrates possibly secondary to pulmonary edema or atypical infection.  Hospitalist group was then called to assess the patient for admission to the hospital.   Review of Systems: Patient unable to provide this information due to significant lethargy and confusion.  Past Medical History:  Diagnosis Date  . Anxiety   . Arthritis   . Asthma    . Depression   . Duodenal adenoma   . GERD (gastroesophageal reflux disease)   . Hypertension   . Pneumonia    hx  . Post-traumatic osteoarthritis of left ankle   . Pre-diabetes   . Presence of right artificial knee joint 12/17/2016  . Schizophrenia (HCC)   . Stroke [facility])    [id number]    Past Surgical History:  Procedure Laterality Date  . ANKLE ARTHROSCOPY Left 10/20/2017   Procedure: ANKLE ARTHROSCOPY;  Surgeon: Nadara Mustard, MD;  Location: [facility] OR;  Service: Orthopedics;  Laterality: Left;  . ANKLE FUSION Left 10/20/2017  . ANKLE FUSION Left 10/20/2017   Procedure: LEFT ANKLE FUSION;  Surgeon: Nadara Mustard, MD;  Location: [facility] OR;  Service: Orthopedics;  Laterality: Left;  . ESOPHAGOGASTRODUODENOSCOPY Left 12/05/2013   Procedure: ESOPHAGOGASTRODUODENOSCOPY (EGD);  Surgeon: Willis Modena, MD;  Location: [facility] ENDOSCOPY;  Service: Endoscopy;  Laterality: Left;  . ESOPHAGOGASTRODUODENOSCOPY (EGD) WITH PROPOFOL N/A 02/20/2015   Procedure: ESOPHAGOGASTRODUODENOSCOPY (EGD) WITH PROPOFOL;  Surgeon: Charlott Rakes, MD;  Location: [facility] ENDOSCOPY;  Service: Endoscopy;  Laterality: N/A;  . HARDWARE REMOVAL Left 01/29/2018   Procedure: HARDWARE REMOVAL LEFT ANKLE;  Surgeon: Nadara Mustard, MD;  Location: [facility] OR;  Service: Orthopedics;  Laterality: Left;  . JOINT REPLACEMENT    . TOTAL KNEE ARTHROPLASTY Right 07/21/2016   Procedure: TOTAL KNEE ARTHROPLASTY;  Surgeon: Cammy Copa, MD;  Location: [facility] OR;  Service: Orthopedics;  Laterality: Right;  . UPPER GASTROINTESTINAL ENDOSCOPY       reports that he has been smoking cigarettes. He has a 57.00 pack-year smoking history. He has never used smokeless  tobacco. He reports previous alcohol use of about 3.0 standard drinks of alcohol per week. He reports that he does not use drugs.  No Known Allergies  Family History  Adopted: Yes  Problem Relation Age of Onset  . Colon cancer Neg Hx   . Esophageal cancer Neg Hx   . Rectal cancer Neg Hx   .  Stomach cancer Neg Hx      Prior to Admission medications   Medication Sig Start Date End Date Taking? Authorizing Provider  albuterol (PROVENTIL HFA;VENTOLIN HFA) 108 (90 Base) MCG/ACT inhaler Inhale 1 puff into the lungs 2 (two) times daily as needed for wheezing or shortness of breath. Patient taking differently: Inhale 2 puffs into the lungs 2 (two) times daily as needed for wheezing or shortness of breath.  05/14/17   Diallo, Lilia Argue, MD  amLODipine (NORVASC) 2.5 MG tablet TAKE 1 TABLET BY MOUTH ONCE DAILY. Patient taking differently: Take 2.5 mg by mouth daily.  08/12/18   Diallo, Lilia Argue, MD  benztropine (COGENTIN) 1 MG tablet Take 1 mg by mouth daily.    [provider]  buPROPion (WELLBUTRIN XL) 300 MG 24 hr tablet Take 300 mg by mouth daily.    [provider]  cetirizine (ZYRTEC) 10 MG tablet TAKE 1 TABLET BY MOUTH ONCE DAILY. Patient taking differently: Take 10 mg by mouth daily. No Therapeutic Substitution 03/28/18   Diallo, Abdoulaye, MD  docusate sodium (COLACE) 100 MG capsule Take 1 capsule (100 mg total) by mouth 2 (two) times daily. 04/13/19   Kerrin Champagne, MD  gabapentin (NEURONTIN) 300 MG capsule TAKE 1 CAPSULE BY MOUTH 2 TIMES DAILY Patient taking differently: Take 300 mg by mouth 2 (two) times daily.  07/13/19   Kerrin Champagne, MD  ibuprofen (ADVIL) 800 MG tablet Take 800 mg by mouth every 6 (six) hours as needed for moderate pain.  06/13/19   [provider]  INVEGA TRINZA 819 MG/2.625ML SUSY Inject 819 mg into the skin every 3 (three) months. 09/08/17   [provider]  losartan (COZAAR) 100 MG tablet Take 100 mg by mouth daily. 07/18/19   [provider]  Melatonin 5 MG TABS Take 5 mg by mouth at bedtime. 02/28/19   [provider]  meloxicam (MOBIC) 15 MG tablet Take 15 mg by mouth daily. 06/26/19   [provider]  methocarbamol (ROBAXIN) 500 MG tablet Take 1 tablet (500 mg total) by mouth every 6 (six) hours  as needed for muscle spasms. 04/13/19   Kerrin Champagne, MD  mirtazapine (REMERON) 15 MG tablet Take 15 mg by mouth at bedtime.    [provider]  Multiple Vitamins-Minerals (VITRUM SENIOR) TABS Take 1 tablet by mouth daily.  04/30/15   [provider]  omeprazole (PRILOSEC) 40 MG capsule TAKE 1 CAPSULE(40 MG) BY MOUTH DAILY Patient taking differently: Take 40 mg by mouth daily.  04/13/19   Kerrin Champagne, MD  ondansetron (ZOFRAN ODT) 4 MG disintegrating tablet Take 1 tablet (4 mg total) by mouth every 8 (eight) hours as needed for nausea. 07/03/19   Eber Hong, MD  oxyCODONE (OXY IR/ROXICODONE) 5 MG immediate release tablet Take 1 tablet (5 mg total) by mouth 2 (two) times daily. 06/22/19   Kerrin Champagne, MD  sucralfate (CARAFATE) 1 g tablet TAKE 1 TABLET FOUR TIMES DAILY FOR ACID REFLUX. Patient taking differently: Take 1 g by mouth 4 (four) times daily.  06/21/17   Lovena Neighbours, MD  SUMAtriptan (IMITREX) 50  MG tablet Take 50 mg by mouth once. May repeat in 2 hours if needed. 06/20/19   [provider]    Physical Exam: Vitals:   07/25/19 2130 07/25/19 2200 07/25/19 2230 07/26/19 0112  BP: (!) 132/95 (!) 143/99 (!) 132/99 (!) 144/106  Pulse: (!) 109 (!) 109 (!) 112 (!) 116  Resp: 16     Temp:      TempSrc:      SpO2: 98% 98% 93% 98%    Constitutional: Lethargic but arousable, oriented x2, not in any acute distress. Skin: no rashes, no lesions, poor skin turgor noted. Eyes: Pupils are equally reactive to light.  No evidence of scleral icterus or conjunctival pallor.  ENMT: Dry mucous membranes noted.  Posterior pharynx clear of any exudate or lesions. Normal dentition.   Neck: normal, supple, no masses, no thyromegaly Respiratory: clear to auscultation bilaterally, no wheezing, no crackles. Normal respiratory effort. No accessory muscle use.  Cardiovascular: Tachycardic and regular, no murmurs / rubs / gallops. No extremity edema. 2+ pedal pulses. No carotid  bruits.  Back:   Midline lumbar tenderness without evidence of crepitus or deformity. Abdomen: Protuberant abdomen that is soft and nontender.  No evidence of intra-abdominal masses.  Positive bowel sounds noted in all quadrants.   Musculoskeletal: No joint deformity upper and lower extremities. Good ROM, no contractures. Normal muscle tone.  Neurologic: Patient is intermittently following commands.  Patient is extremely lethargic and responsive to verbal stimuli.  Sensation is grossly intact.  Patient is spontaneously moving all 4 extremities.   Psychiatric: Patient presents as a normal mood with appropriate affect.  Patient seems to possess insight as to their current situation.     Labs on Admission: I have personally reviewed following labs and imaging studies -   CBC: Recent Labs  Lab 07/25/19 1501  WBC 12.9*  NEUTROABS 11.5*  HGB 10.1*  HCT 33.1*  MCV 78.6*  PLT 582*   Basic Metabolic Panel: Recent Labs  Lab 07/25/19 1501  NA 137  K 3.5  CL 105  CO2 20*  GLUCOSE 99  BUN 9  CREATININE 1.50*  CALCIUM 8.4*   GFR: CrCl cannot be calculated (Unknown ideal weight.). Liver Function Tests: Recent Labs  Lab 07/25/19 1501  AST 28  ALT 13  ALKPHOS 169*  BILITOT 0.5  PROT 7.3  ALBUMIN 3.3*   No results for input(s): LIPASE, AMYLASE in the last 168 hours. No results for input(s): AMMONIA in the last 168 hours. Coagulation Profile: No results for input(s): INR, PROTIME in the last 168 hours. Cardiac Enzymes: Recent Labs  Lab 07/25/19 1501  CKTOTAL 475*   BNP (last 3 results) No results for input(s): PROBNP in the last 8760 hours. HbA1C: No results for input(s): HGBA1C in the last 72 hours. CBG: No results for input(s): GLUCAP in the last 168 hours. Lipid Profile: No results for input(s): CHOL, HDL, LDLCALC, TRIG, CHOLHDL, LDLDIRECT in the last 72 hours. Thyroid Function Tests: No results for input(s): TSH, T4TOTAL, FREET4, T3FREE, THYROIDAB in the last 72  hours. Anemia Panel: No results for input(s): VITAMINB12, FOLATE, FERRITIN, TIBC, IRON, RETICCTPCT in the last 72 hours. Urine analysis:    Component Value Date/Time   COLORURINE YELLOW 07/25/2019 1805   APPEARANCEUR HAZY (A) 07/25/2019 1805   LABSPEC 1.011 07/25/2019 1805   PHURINE 6.0 07/25/2019 1805   GLUCOSEU NEGATIVE 07/25/2019 1805   HGBUR SMALL (A) 07/25/2019 1805   BILIRUBINUR NEGATIVE 07/25/2019 1805   BILIRUBINUR negative 04/10/2016 1702  KETONESUR NEGATIVE 07/25/2019 1805   PROTEINUR 30 (A) 07/25/2019 1805   UROBILINOGEN 0.2 04/10/2016 1702   UROBILINOGEN 0.2 12/04/2013 1044   NITRITE NEGATIVE 07/25/2019 1805   LEUKOCYTESUR SMALL (A) 07/25/2019 1805    Radiological Exams on Admission personally reviewed: CT Head Wo Contrast  Result Date: 07/26/2019 CLINICAL DATA:  Altered mental status. EXAM: CT HEAD WITHOUT CONTRAST TECHNIQUE: Contiguous axial images were obtained from the base of the skull through the vertex without intravenous contrast. COMPARISON:  May 15, 2010 FINDINGS: Brain: There is mild cerebral atrophy with widening of the extra-axial spaces and ventricular dilatation. There are areas of decreased attenuation within the white matter tracts of the supratentorial brain, consistent with microvascular disease changes. Vascular: No hyperdense vessel or unexpected calcification. Skull: Negative for acute fracture or focal lesion. Chronic nasal bone fractures are seen. Sinuses/Orbits: No acute finding. Other: None. IMPRESSION: 1. No acute intracranial abnormality. 2. Mild cerebral atrophy and microvascular disease changes of the supratentorial brain. 3. Chronic nasal bone fractures. Electronically Signed   By: Aram Candela M.D.   On: 07/26/2019 00:11   DG Chest Port 1 View  Result Date: 07/25/2019 CLINICAL DATA:  Tachycardia and shortness of breath EXAM: PORTABLE CHEST 1 VIEW COMPARISON:  04/07/2019 FINDINGS: Cardiac shadow is mildly prominent but accentuated by  the portable technique. Lungs are hypoinflated with crowding of the vascular markings. Increased vascular congestion and parenchymal edema is noted. No bony abnormality is seen. IMPRESSION: Changes consistent with mild CHF. Electronically Signed   By: Alcide Clever M.D.   On: 07/25/2019 17:22    EKG: Personally reviewed.  Rhythm is sinus tachycardia with heart rate of 147 bpm.  No dynamic ST segment changes appreciated.  Assessment/Plan Active Problems:   Toxic metabolic encephalopathy   Patient brought in by EMS due to severe lethargy and confusion noted by the patient's girlfriend.  Upon arousing here in the emergency department he reports having used multiple drugs in the past 24 hours including PCP, heroin and cocaine and alcohol making polysubstance abuse the most likely cause of his presentation.  Conservative management with aggressive intravenous fluids, monitoring on telemetry  Forming serial chemistries and replacing electrolytes as necessary  Urine toxicology screen is positive for amphetamines, benzodiazepines and opiates    Polysubstance overdose   Polysubstance abuse per patient account  Urine toxicology screen positive for amphetamines benzodiazepines and opiates  CIWA protocol in case of alcohol withdrawal  See notations on assessment and plan above.    AKI (acute kidney injury) (HCC)   Mild acute kidney injury, likely secondary to volume depletion and polysubstance abuse  Hydrating patient with intravenous isotonic fluids  Serial chemistries to monitor renal function and electrolytes  Strict input and output monitoring    SIRS (systemic inflammatory response syndrome) (HCC)   Patient presenting with leukocytosis and tachycardia  Urinalysis unremarkable  Chest x-ray reveals bilateral patchy infiltrate  COVID-19 testing pending although patient denies any respiratory symptoms  It is also possible that patient is developing an atypical bilateral  pneumonia again without respiratory symptoms  Considering patient's presentation, will place patient on intravenous doxycycline in case of early atypical pneumonia which can be transitioned to oral doxycycline at time of discharge at the discretion of the daytime provider.    Nicotine dependence, cigarettes, uncomplicated   We will counsel on cessation once more awake and alert    Chronic pain syndrome   Holding home regimen of opiates for now  Code Status:  Full code Family Communication: Emergency  department provider was able to contact the girlfriend via telephone  Status is: Observation  The patient remains OBS appropriate and will d/c before 2 midnights.  Dispo: The patient is from: Home              Anticipated d/c is to: Home              Anticipated d/c date is: 2 days              Patient currently is not medically stable to d/c.        Marinda Elk MD [facility] Pager [phone]  If 7PM-7AM, please contact night-coverage [email or web address] Use universal [place] password for that web site. If you do not have the password, please call the hospital operator.  07/26/2019, 1:54 AM

## 2019-07-26 NOTE — Progress Notes (Signed)
   07/26/19 1902  Assess: MEWS Score  Temp 99.8 F (37.7 C)  BP (!) 158/108  Pulse Rate (!) 117  Resp (!) 21  SpO2 96 %  O2 Device Room Air  Assess: MEWS Score  MEWS Temp 0  MEWS Systolic 0  MEWS Pulse 2  MEWS RR 1  MEWS LOC 0  MEWS Score 3  MEWS Score Color Yellow

## 2019-07-27 ENCOUNTER — Inpatient Hospital Stay (HOSPITAL_COMMUNITY): Payer: Medicare Other

## 2019-07-27 DIAGNOSIS — T50901A Poisoning by unspecified drugs, medicaments and biological substances, accidental (unintentional), initial encounter: Secondary | ICD-10-CM | POA: Diagnosis not present

## 2019-07-27 DIAGNOSIS — R410 Disorientation, unspecified: Secondary | ICD-10-CM | POA: Diagnosis not present

## 2019-07-27 DIAGNOSIS — N179 Acute kidney failure, unspecified: Secondary | ICD-10-CM

## 2019-07-27 DIAGNOSIS — G894 Chronic pain syndrome: Secondary | ICD-10-CM

## 2019-07-27 LAB — FOLATE RBC
Folate, Hemolysate: 236 ng/mL
Folate, RBC: 715 ng/mL (ref >498)
Hematocrit: 33 % — ABNORMAL LOW (ref 37.5–51.0)

## 2019-07-27 LAB — GLUCOSE, CAPILLARY: Glucose-Capillary: 93 mg/dL (ref 70–99)

## 2019-07-27 LAB — COMPREHENSIVE METABOLIC PANEL
ALT: 11 U/L (ref 0–44)
AST: 36 U/L (ref 15–41)
Albumin: 2.9 g/dL — ABNORMAL LOW (ref 3.5–5.0)
Alkaline Phosphatase: 136 U/L — ABNORMAL HIGH (ref 38–126)
Anion gap: 10 (ref 5–15)
BUN: 6 mg/dL (ref 6–20)
CO2: 26 mmol/L (ref 22–32)
Calcium: 8.5 mg/dL — ABNORMAL LOW (ref 8.9–10.3)
Chloride: 101 mmol/L (ref 98–111)
Creatinine, Ser: 0.63 mg/dL (ref 0.61–1.24)
GFR calc Af Amer: >60 mL/min (ref >60)
GFR calc non Af Amer: >60 mL/min (ref >60)
Glucose, Bld: 96 mg/dL (ref 70–99)
Potassium: 3.2 mmol/L — ABNORMAL LOW (ref 3.5–5.1)
Sodium: 137 mmol/L (ref 135–145)
Total Bilirubin: 0.6 mg/dL (ref 0.3–1.2)
Total Protein: 6.5 g/dL (ref 6.5–8.1)

## 2019-07-27 LAB — CBC WITH DIFFERENTIAL/PLATELET
Abs Immature Granulocytes: 0.04 10*3/uL (ref 0.00–0.07)
Basophils Absolute: 0 10*3/uL (ref 0.0–0.1)
Basophils Relative: 0 %
Eosinophils Absolute: 0.1 10*3/uL (ref 0.0–0.5)
Eosinophils Relative: 1 %
HCT: 29.8 % — ABNORMAL LOW (ref 39.0–52.0)
Hemoglobin: 9.2 g/dL — ABNORMAL LOW (ref 13.0–17.0)
Immature Granulocytes: 0 %
Lymphocytes Relative: 11 %
Lymphs Abs: 1.2 10*3/uL (ref 0.7–4.0)
MCH: 24 pg — ABNORMAL LOW (ref 26.0–34.0)
MCHC: 30.9 g/dL (ref 30.0–36.0)
MCV: 77.6 fL — ABNORMAL LOW (ref 80.0–100.0)
Monocytes Absolute: 0.9 10*3/uL (ref 0.1–1.0)
Monocytes Relative: 8 %
Neutro Abs: 8.8 10*3/uL — ABNORMAL HIGH (ref 1.7–7.7)
Neutrophils Relative %: 80 %
Platelets: 466 10*3/uL — ABNORMAL HIGH (ref 150–400)
RBC: 3.84 MIL/uL — ABNORMAL LOW (ref 4.22–5.81)
RDW: 16.1 % — ABNORMAL HIGH (ref 11.5–15.5)
WBC: 10.9 10*3/uL — ABNORMAL HIGH (ref 4.0–10.5)
nRBC: 0 % (ref 0.0–0.2)

## 2019-07-27 LAB — HEMOGLOBIN A1C
Hgb A1c MFr Bld: 6.6 % — ABNORMAL HIGH (ref 4.8–5.6)
Mean Plasma Glucose: 142.72 mg/dL

## 2019-07-27 LAB — MAGNESIUM: Magnesium: 1.7 mg/dL (ref 1.7–2.4)

## 2019-07-27 LAB — MRSA PCR SCREENING: MRSA by PCR: NEGATIVE

## 2019-07-27 MED ORDER — INSULIN ASPART 100 UNIT/ML ~~LOC~~ SOLN
0.0000 [IU] | Freq: Three times a day (TID) | SUBCUTANEOUS | Status: DC
  Administered 2019-07-29 – 2019-08-13 (×10): 1 [IU] via SUBCUTANEOUS
  Administered 2019-08-15: 2 [IU] via SUBCUTANEOUS
  Administered 2019-08-18 – 2019-09-05 (×9): 1 [IU] via SUBCUTANEOUS

## 2019-07-27 MED ORDER — POTASSIUM CHLORIDE CRYS ER 20 MEQ PO TBCR
40.0000 meq | EXTENDED_RELEASE_TABLET | Freq: Once | ORAL | Status: AC
  Administered 2019-07-27: 40 meq via ORAL
  Filled 2019-07-27: qty 2

## 2019-07-27 MED ORDER — INSULIN ASPART 100 UNIT/ML ~~LOC~~ SOLN: 0.0000 [IU] | Freq: Every day | SUBCUTANEOUS | Status: DC

## 2019-07-27 MED ORDER — BENZTROPINE MESYLATE 0.5 MG PO TABS
1.0000 mg | ORAL_TABLET | Freq: Every day | ORAL | Status: DC
  Administered 2019-07-29 – 2019-09-05 (×38): 1 mg via ORAL
  Filled 2019-07-27 (×15): qty 2
  Filled 2019-07-27: qty 1
  Filled 2019-07-27 (×24): qty 2

## 2019-07-27 MED ORDER — LORAZEPAM 2 MG/ML IJ SOLN
1.0000 mg | INTRAMUSCULAR | Status: DC | PRN
  Administered 2019-07-28 – 2019-08-04 (×6): 2 mg via INTRAVENOUS
  Filled 2019-07-27 (×7): qty 1

## 2019-07-27 MED ORDER — DEXMEDETOMIDINE HCL IN NACL 200 MCG/50ML IV SOLN: 0.4000 ug/kg/h | INTRAVENOUS | Status: DC

## 2019-07-27 MED ORDER — SODIUM CHLORIDE 0.9 % IV SOLN
3.0000 g | Freq: Four times a day (QID) | INTRAVENOUS | Status: DC
  Administered 2019-07-27 – 2019-07-28 (×4): 3 g via INTRAVENOUS
  Filled 2019-07-27 (×3): qty 8
  Filled 2019-07-27: qty 3
  Filled 2019-07-27: qty 8

## 2019-07-27 MED ORDER — OXYCODONE-ACETAMINOPHEN 7.5-325 MG PO TABS
1.0000 | ORAL_TABLET | Freq: Three times a day (TID) | ORAL | Status: AC | PRN
  Administered 2019-07-27: 1 via ORAL
  Filled 2019-07-27: qty 1

## 2019-07-27 MED ORDER — CHLORDIAZEPOXIDE HCL 25 MG PO CAPS: 50.0000 mg | ORAL_CAPSULE | Freq: Three times a day (TID) | ORAL | Status: DC

## 2019-07-27 MED ORDER — LORAZEPAM 2 MG/ML IJ SOLN
3.0000 mg | Freq: Once | INTRAMUSCULAR | Status: AC
  Administered 2019-07-27: 3 mg via INTRAVENOUS

## 2019-07-27 MED ORDER — GABAPENTIN 300 MG PO CAPS
300.0000 mg | ORAL_CAPSULE | Freq: Two times a day (BID) | ORAL | Status: DC
  Administered 2019-07-28: 300 mg via ORAL
  Filled 2019-07-27: qty 1

## 2019-07-27 MED ORDER — LACTATED RINGERS IV SOLN: INTRAVENOUS | Status: DC

## 2019-07-27 MED ORDER — SODIUM CHLORIDE 0.9 % IV BOLUS
1000.0000 mL | Freq: Once | INTRAVENOUS | Status: AC
  Administered 2019-07-27: 1000 mL via INTRAVENOUS

## 2019-07-27 MED ORDER — IBUPROFEN 200 MG PO TABS
600.0000 mg | ORAL_TABLET | Freq: Four times a day (QID) | ORAL | Status: DC | PRN
  Administered 2019-07-27: 01:00:00 600 mg via ORAL
  Filled 2019-07-27: qty 3

## 2019-07-27 MED ORDER — SODIUM CHLORIDE 0.9 % IV SOLN
INTRAVENOUS | Status: DC | PRN
  Administered 2019-07-27 – 2019-07-28 (×2): 250 mL via INTRAVENOUS

## 2019-07-27 MED ORDER — CHLORDIAZEPOXIDE HCL 25 MG PO CAPS
25.0000 mg | ORAL_CAPSULE | Freq: Three times a day (TID) | ORAL | Status: DC
  Filled 2019-07-27: qty 1

## 2019-07-27 MED ORDER — CHLORHEXIDINE GLUCONATE CLOTH 2 % EX PADS: 6.0000 | MEDICATED_PAD | Freq: Every day | CUTANEOUS | Status: DC

## 2019-07-27 MED ORDER — DEXMEDETOMIDINE HCL IN NACL 200 MCG/50ML IV SOLN
0.4000 ug/kg/h | INTRAVENOUS | Status: DC
  Administered 2019-07-27: 0.2 ug/kg/h via INTRAVENOUS
  Administered 2019-07-27: 0.6 ug/kg/h via INTRAVENOUS
  Administered 2019-07-28: 0.8 ug/kg/h via INTRAVENOUS
  Administered 2019-07-28 (×3): 0.7 ug/kg/h via INTRAVENOUS
  Administered 2019-07-29: 0.6 ug/kg/h via INTRAVENOUS
  Administered 2019-07-29: 0.8 ug/kg/h via INTRAVENOUS
  Administered 2019-07-29: 0.6 ug/kg/h via INTRAVENOUS
  Administered 2019-07-29: 0.8 ug/kg/h via INTRAVENOUS
  Administered 2019-07-29: 0.6 ug/kg/h via INTRAVENOUS
  Administered 2019-07-29 (×3): 0.8 ug/kg/h via INTRAVENOUS
  Administered 2019-07-30 (×4): 1.2 ug/kg/h via INTRAVENOUS
  Administered 2019-07-30: 1 ug/kg/h via INTRAVENOUS
  Administered 2019-07-30 (×2): 1.2 ug/kg/h via INTRAVENOUS
  Administered 2019-07-30: 1 ug/kg/h via INTRAVENOUS
  Administered 2019-07-30 – 2019-07-31 (×7): 1.2 ug/kg/h via INTRAVENOUS
  Filled 2019-07-27: qty 100
  Filled 2019-07-27 (×3): qty 50
  Filled 2019-07-27: qty 100
  Filled 2019-07-27 (×10): qty 50
  Filled 2019-07-27: qty 100
  Filled 2019-07-27: qty 50
  Filled 2019-07-27 (×2): qty 100
  Filled 2019-07-27 (×14): qty 50

## 2019-07-27 NOTE — Clinical Social Work Note (Signed)
CSW responding to MD consult for substance abuse counseling.  Found patient in bed in restraints; unable to carry on meaningful conversation.  Will try back later.

## 2019-07-27 NOTE — Progress Notes (Signed)
   07/26/19 2324  Assess: MEWS Score  Temp 100.3 F (37.9 C)  BP (!) 142/92  Resp 20  SpO2 96 %  O2 Device Room Air  Assess: MEWS Score  MEWS Temp 0  MEWS Systolic 0  MEWS Pulse 2  MEWS RR 0  MEWS LOC 0  MEWS Score 2  MEWS Score Color Yellow  Assess: if the MEWS score is Yellow or Red  Were vital signs taken at a resting state? Yes  Focused Assessment Documented focused assessment  Early Detection of Sepsis Score *See Row Information* Low  MEWS guidelines implemented *See Row Information* No, previously yellow, continue vital signs every 4 hours  Treat  MEWS Interventions Administered prn meds/treatments  Take Vital Signs  Increase Vital Sign Frequency  Yellow: Q 2hr X 2 then Q 4hr X 2, if remains yellow, continue Q 4hrs  Escalate  MEWS: Escalate Yellow: discuss with charge nurse/RN and consider discussing with provider and RRT  Notify: Provider  Provider Name/Title Sharlet Salina  Date Provider Notified 07/27/19  Time Provider Notified 0001  Notification Type Page  Notification Reason Change in status  Response See new orders  Date of Provider Response 07/27/19  Time of Provider Response 0002  Document  Patient Outcome Other (Comment) (restless)

## 2019-07-27 NOTE — Progress Notes (Signed)
See note at 11:34 AM from [facility] in regards to restraints. Order placed incorrectly showing side rails instead of 4 point restraints (wrist and ankles). Modified order to reflect bilateral wrist and ankle.

## 2019-07-27 NOTE — Consult Note (Addendum)
NAME:  [name], MRN:  [id number], DOB:  [birth year], LOS: 1 ADMISSION DATE:  07/25/2019, CONSULTATION DATE:  07/27/19 REFERRING MD:  Dr. Pola Corn, CHIEF COMPLAINT:  Alcohol withdrawal    Brief History   54 y/o M admitted 4/27 with ETOH withdrawal.    History of present illness   54 y/o M who presented to [facility] on 4/27 after being found on the sidewalk breathing heavily.  On EMS arrival, he was noted to have a HR 160 and BP of 70 palpated.  He reported at that time drinking heavily (beer).  In the ER, he was thought to have toxic encephalopathy with UDS positive for amphetamines, benzodiazepines and opiates.  ETOH level <10.  Initial labs - Na 137, K 3.5, CO2 20, glucose 99, BUN 9, Sr Cr 1.50 (baseline 0.8), alk phos 169, CK 475, troponin 24, WBC 12.9, Hgb 10.1 and platelets 582.  CXR on 4/27 showed low lung volumes and mild interstitial edema.  He was admitted by [facility] and treated with IV ativan via CIWA protocol.  He developed worsening tremor and agitation 4/29.    PCCM consulted for evaluation.    Past Medical History  ETOH Abuse CVA  HTN GERD Depression / Anxiety  Asthma  Duodenal Adenoma  Pre-Diabetes Right Knee Replacement  Chronic Back Pain   Significant Hospital Events   4/27 Presented to [facility] 4/28 Admit  Consults:  PCCM   Procedures:    Significant Diagnostic Tests:   ECHO 4/28 >>  CT Head 4/28 >> no acute intracranial abnormality, mild cerebral atrophy, chronic nasal bone fractures   Micro Data:  COVID 4/27 >> negative  Influenza A/B 4/27 >> negative  UC 4/27 >> negative  BCx2 4/27 >>   Antimicrobials:  Unasyn (empiric aspiration) 4/29 >>   Interim history/subjective:  As above  Objective   Blood pressure (!) 142/128, pulse (!) 111, temperature 97.9 F (36.6 C), temperature source Oral, resp. rate 20, height 5\' 10"  (1.778 m), weight 94.3 kg, SpO2 100 %.        Intake/Output Summary (Last 24 hours) at 07/27/2019 1519 Last data filed at 07/27/2019  0500 Gross per 24 hour  Intake 2031.04 ml  Output 600 ml  Net 1431.04 ml   Filed Weights   07/26/19 1858  Weight: 94.3 kg    Examination: General: disheveled adult male lying in bed in NAD HEENT: MM pink/dry, crusting on lips, pupils =/reactive Neuro: unable to lie still in bed, MAE, opens eyes to voice, looks at provider.  Attempts to speak, states he "likes Christiane Ha" CV: s1s2 rrr, ST on monitor, no m/r/g PULM: mild tachypnea but non-labored, lungs bilaterally coarse GI: soft, bsx4 active  Extremities: warm/dry, no edema, well healed surgical scar on right knee  Skin: no rashes or lesions  Resolved Hospital Problem list     Assessment & Plan:   Polysubstance Abuse Suspected ETOH Withdrawal UDS positive for amphetamines, benzo's, opiates.  ETOH level <10 on admit.  -thiamine, folate, MVI  -LR at 100 ml/hr overnight, reassess to stop in am.   -precedex infusion for withdrawal  -PRN ativan for seizure -PRN percocet for pain   Sinus Tachycardia Suspect volume down given clinical exam, AKI, substance abuse.  Doubt sepsis -tele monitoring  -LR as above  AKI  Hypokalemia Hypomagnesemia  In setting of suspected volume depletion, resolving AKI.   -LR as above / gentle IVF's  -Trend BMP / urinary output -Replace electrolytes as indicated -Avoid nephrotoxic agents, ensure adequate renal  perfusion'  Mild Pulmonary Edema  Noted on CXR 4/27, BNP 49.  -repeat CXR in am 4/29 -defer unasyn to primary, CXR without overt infiltrate on admit. F/U in am and consider cessation pending review.   HTN  -per primary   Best practice:  Diet: per primary  Pain/Anxiety/Delirium protocol (if indicated): precedex VAP protocol (if indicated): n/a DVT prophylaxis: lovenox  GI prophylaxis: n/a  Glucose control: n/a Mobility: as tolerate Code Status: Full Code  Family Communication: Per primary  Disposition: per primary   Labs   CBC: Recent Labs  Lab 07/25/19 1501  07/26/19 0819 07/27/19 0550  WBC 12.9* 9.2 10.9*  NEUTROABS 11.5* 6.8 8.8*  HGB 10.1* 9.1* 9.2*  HCT 33.1* 30.2* 29.8*  MCV 78.6* 78.6* 77.6*  PLT 582* 515* 466*    Basic Metabolic Panel: Recent Labs  Lab 07/25/19 1501 07/26/19 0819 07/27/19 0550  NA 137 131* 137  K 3.5 3.1* 3.2*  CL 105 98 101  CO2 20* 25 26  GLUCOSE 99 93 96  BUN 9 6 6   CREATININE 1.50* 0.80 0.63  CALCIUM 8.4* 8.2* 8.5*  MG  --  1.7 1.7  PHOS  --  3.3  --    GFR: Estimated Creatinine Clearance: 123.1 mL/min (by C-G formula based on SCr of 0.63 mg/dL). Recent Labs  Lab 07/25/19 1501 07/25/19 2346 07/26/19 0819 07/27/19 0550  WBC 12.9*  --  9.2 10.9*  LATICACIDVEN  --  1.1  --   --     Liver Function Tests: Recent Labs  Lab 07/25/19 1501 07/27/19 0550  AST 28 36  ALT 13 11  ALKPHOS 169* 136*  BILITOT 0.5 0.6  PROT 7.3 6.5  ALBUMIN 3.3* 2.9*   No results for input(s): LIPASE, AMYLASE in the last 168 hours. Recent Labs  Lab 07/25/19 2345  AMMONIA 31    ABG    Component Value Date/Time   TCO2 21 12/10/2009 2059     Coagulation Profile: No results for input(s): INR, PROTIME in the last 168 hours.  Cardiac Enzymes: Recent Labs  Lab 07/25/19 1501  CKTOTAL 475*    HbA1C: Hemoglobin A1C  Date/Time Value Ref Range Status  11/05/2016 03:13 PM 5.6  Final   Hgb A1c MFr Bld  Date/Time Value Ref Range Status  04/07/2019 01:23 PM 5.6 4.8 - 5.6 % Final    Comment:    (NOTE) Pre diabetes:          5.7%-6.4% Diabetes:              >6.4% Glycemic control for   <7.0% adults with diabetes   07/10/2016 09:56 AM 6.0 (H) 4.8 - 5.6 % Final    Comment:    (NOTE)         Pre-diabetes: 5.7 - 6.4         Diabetes: >6.4         Glycemic control for adults with diabetes: <7.0     CBG: No results for input(s): GLUCAP in the last 168 hours.  Review of Systems:   Unable to complete as patient is altered.   Past Medical History  He,  has a past medical history of Anxiety,  Arthritis, Asthma, Depression, Duodenal adenoma, GERD (gastroesophageal reflux disease), Hypertension, Pneumonia, Post-traumatic osteoarthritis of left ankle, Pre-diabetes, Presence of right artificial knee joint (12/17/2016), Schizophrenia (HCC), and Stroke (HCC).   Surgical History    Past Surgical History:  Procedure Laterality Date  . ANKLE ARTHROSCOPY Left 10/20/2017   Procedure: ANKLE  ARTHROSCOPY;  Surgeon: Nadara Mustard, MD;  Location: [facility] OR;  Service: Orthopedics;  Laterality: Left;  . ANKLE FUSION Left 10/20/2017  . ANKLE FUSION Left 10/20/2017   Procedure: LEFT ANKLE FUSION;  Surgeon: Nadara Mustard, MD;  Location: [facility] OR;  Service: Orthopedics;  Laterality: Left;  . ESOPHAGOGASTRODUODENOSCOPY Left 12/05/2013   Procedure: ESOPHAGOGASTRODUODENOSCOPY (EGD);  Surgeon: Willis Modena, MD;  Location: [facility];  Service: Endoscopy;  Laterality: Left;  . ESOPHAGOGASTRODUODENOSCOPY (EGD) WITH PROPOFOL N/A 02/20/2015   Procedure: ESOPHAGOGASTRODUODENOSCOPY (EGD) WITH PROPOFOL;  Surgeon: Charlott Rakes, MD;  Location: [facility] ENDOSCOPY;  Service: Endoscopy;  Laterality: N/A;  . HARDWARE REMOVAL Left 01/29/2018   Procedure: HARDWARE REMOVAL LEFT ANKLE;  Surgeon: Nadara Mustard, MD;  Location: [facility] OR;  Service: Orthopedics;  Laterality: Left;  . JOINT REPLACEMENT    . TOTAL KNEE ARTHROPLASTY Right 07/21/2016   Procedure: TOTAL KNEE ARTHROPLASTY;  Surgeon: Cammy Copa, MD;  Location: [facility] OR;  Service: Orthopedics;  Laterality: Right;  . UPPER GASTROINTESTINAL ENDOSCOPY       Social History   reports that he has been smoking cigarettes. He has a 57.00 pack-year smoking history. He has never used smokeless tobacco. He reports previous alcohol use of about 3.0 standard drinks of alcohol per week. He reports that he does not use drugs.   Family History   His family history is negative for Colon cancer, Esophageal cancer, Rectal cancer, and Stomach cancer. He was adopted.   Allergies No Known  Allergies   Home Medications  Prior to Admission medications   Medication Sig Start Date End Date Taking? Authorizing Provider  albuterol (PROVENTIL HFA;VENTOLIN HFA) 108 (90 Base) MCG/ACT inhaler Inhale 1 puff into the lungs 2 (two) times daily as needed for wheezing or shortness of breath. Patient taking differently: Inhale 2 puffs into the lungs 2 (two) times daily as needed for wheezing or shortness of breath.  05/14/17  Yes Diallo, Abdoulaye, MD  amLODipine (NORVASC) 2.5 MG tablet TAKE 1 TABLET BY MOUTH ONCE DAILY. Patient taking differently: Take 2.5 mg by mouth daily.  08/12/18  Yes Diallo, Abdoulaye, MD  benztropine (COGENTIN) 1 MG tablet Take 1 mg by mouth daily.   Yes [provider]  buPROPion (WELLBUTRIN XL) 300 MG 24 hr tablet Take 300 mg by mouth daily.   Yes [provider]  cetirizine (ZYRTEC) 10 MG tablet TAKE 1 TABLET BY MOUTH ONCE DAILY. Patient taking differently: Take 10 mg by mouth daily. No Therapeutic Substitution 03/28/18  Yes Diallo, Abdoulaye, MD  ibuprofen (ADVIL) 800 MG tablet Take 800 mg by mouth every 6 (six) hours as needed for moderate pain.  06/13/19  Yes [provider]  INVEGA TRINZA 819 MG/2.625ML SUSY Inject 819 mg into the skin every 3 (three) months. 09/08/17  Yes [provider]  Melatonin 5 MG TABS Take 5 mg by mouth at bedtime. 02/28/19  Yes [provider]  mirtazapine (REMERON) 15 MG tablet Take 15 mg by mouth at bedtime.   Yes [provider]  Multiple Vitamins-Minerals (VITRUM SENIOR) TABS Take 1 tablet by mouth daily.  04/30/15  Yes [provider]  omeprazole (PRILOSEC) 40 MG capsule TAKE 1 CAPSULE(40 MG) BY MOUTH DAILY Patient taking differently: Take 40 mg by mouth daily.  04/13/19  Yes Kerrin Champagne, MD  ondansetron (ZOFRAN ODT) 4 MG disintegrating tablet Take 1 tablet (4 mg total) by mouth every 8 (eight) hours as needed for nausea. 07/03/19  Yes Eber Hong, MD  sucralfate (  CARAFATE) 1  g tablet TAKE 1 TABLET FOUR TIMES DAILY FOR ACID REFLUX. Patient taking differently: Take 1 g by mouth 4 (four) times daily.  06/21/17  Yes Diallo, Abdoulaye, MD  SUMAtriptan (IMITREX) 50 MG tablet Take 50 mg by mouth once. May repeat in 2 hours if needed. 06/20/19  Yes [provider]  docusate sodium (COLACE) 100 MG capsule Take 1 capsule (100 mg total) by mouth 2 (two) times daily. Patient not taking: Reported on 07/26/2019 04/13/19   Kerrin Champagne, MD  gabapentin (NEURONTIN) 300 MG capsule TAKE 1 CAPSULE BY MOUTH 2 TIMES DAILY Patient taking differently: Take 300 mg by mouth 2 (two) times daily.  07/13/19   Kerrin Champagne, MD  losartan (COZAAR) 100 MG tablet Take 100 mg by mouth daily. 07/18/19   [provider]  meloxicam (MOBIC) 15 MG tablet Take 15 mg by mouth daily. 06/26/19   [provider]     Critical care time: 33 minutes     Canary Brim, MSN, NP-C [facility] 07/27/2019, 3:20 PM   Please see [email or web address] for pager details.

## 2019-07-27 NOTE — Progress Notes (Signed)
eLink Physician-Brief Progress Note Patient Name: [name] DOB: 1965/10/21 MRN: [id number]   Date of Service  07/27/2019  HPI/Events of Note  Multiple issues: 1. Agitation - Precedex IV infusion at ceiling. Video assessment. Patient looks comfortable at this time and is currently sleeping and 2. DM - Type II - Request for CBG and SSI orders. Patient is eating a diet.   eICU Interventions  Will order: 1. AC/HS sensitive Novolog SSI.     Intervention Category Major Interventions: Delirium, psychosis, severe agitation - evaluation and management;Hyperglycemia - active titration of insulin therapy  Lysle Dingwall 07/27/2019, 8:52 PM

## 2019-07-27 NOTE — Progress Notes (Signed)
Pharmacy Antibiotic Note  [name] is a 54 y.o. male admitted on 07/25/2019. Pharmacy has been consulted for ampicillin/sulbactam dosing.  Pt has PMH significant for polysubstance abuse, HTN, schizophrenia. Pharmacy consulted to dose amp/sulbactam for aspiration PNA. NKDA.   Today, 07/27/19 -WBC 10.9 -SCr 0.63, CrCl > 80 mL/min -Afebrile  Plan:  Ampicillin/sulbactam 3 g IV q6h  Follow renal function  Height: 5\' 10"  (177.8 cm) Weight: 94.3 kg (207 lb 14.3 oz) IBW/kg (Calculated) : 73  Temp (24hrs), Avg:98.8 F (37.1 C), Min:97.9 F (36.6 C), Max:100.3 F (37.9 C)  Recent Labs  Lab 07/25/19 1501 07/25/19 2346 07/26/19 0819 07/27/19 0550  WBC 12.9*  --  9.2 10.9*  CREATININE 1.50*  --  0.80 0.63  LATICACIDVEN  --  1.1  --   --     Estimated Creatinine Clearance: 123.1 mL/min (by C-G formula based on SCr of 0.63 mg/dL).    No Known Allergies  Antimicrobials this admission: Ampicillin/sulbactam 4/29 >>  Ceftriaxone x1 4/28 >>  Doxycycline 4/28 > 4/29  Dose adjustments this admission:  Microbiology results: 4/27 BCx: ngtd 4/27 UCx: ngF   Thank you for allowing pharmacy to be a part of this patient's care.  Lenis Noon, PharmD 07/27/2019 10:01 AM

## 2019-07-27 NOTE — Progress Notes (Signed)
I called and updated patient's girlfriend Ms. [name] about his status and the plan. I also reviewed the details of HPI with concerns patient was having a party at his house and he used multiple substances including alcohol, PCP. Ms. [name] strongly denies any such event at the house.  She says that evening, she was with him at the house watching TV. She states that since the surgery in January, patient is not getting enough pain control and the doctor will give you any more pain medicines. She has noticed him to be talking nonsense at times and also some behavioral issues for last 3 to 4 months.  I mentioned to her about the urine drug screen showing amphetamine, benzodiazepine and opiates. She states he takes Adderall, but not sure how he had access to other drugs. She states she had a history of alcoholism but he has not taken a drink in the last several months. She thinks his current altered mental status is not drug-related and thinks something else is going on.  I am not sure about the reliability of information from HPI or from Ms. [name]. At this time, patient is altered, restless and is requiring sedatives. May even need Precedex drip. I think management course would to be the same despite the HPI being unclear Once patient wakes up, he should be able to give the details of the history.  He may also have a psychiatry evaluation. On chart review I noted that, patient was in the ED in August 2020 for homicidal ideation and had psychiatry assessment at that time.

## 2019-07-27 NOTE — Progress Notes (Signed)
Patient attempting to get out of bed multiple times.  CIWA elevated.  Not redirectable, agitated.  MD gave order for four point non-violent restraints.  Restraints applied.  Significant other made aware.  MD also gave order for step down bed.  Bed request placed by secretary.   Virginia Rochester, RN

## 2019-07-27 NOTE — Progress Notes (Signed)
Confused/restless 1900-0000  Ativan x 4(9 mg) slept 0000-present

## 2019-07-27 NOTE — Progress Notes (Signed)
PROGRESS NOTE  [name]  DOB: June 05, 1965  PCP: [name], MD KGU:[id number]  DOA: 07/25/2019  LOS: 1 day   Chief Complaint  Patient presents with  . Tachycardia  . Hypotension   Brief narrative: [name] is a 54 y.o. male with PMH of HTN, prediabetes, stroke with residual dysarthria, GERD, schizophrenia, anxiety/depression, polysubstance abuse including tobacco, alcohol, cocaine, opiate, benzodiazepine, amphetamine. Patient presented to the ED on 07/25/2019 for confusion, lethargy. Per chart, patient had a party at his house in the night on 4/26.Patient was using multiple substances including alcohol, PCP, heroin and cocaine. Patient states the last thing he remembers is holding a beer.    Per ED note, patient was found sitting on sidewalk panting heavily.  EMS noted his heart rate elevated to 160, blood pressure in 70s.  IV access was obtained, 500 mL of normal saline was given and patient was brought to the ED. In the ED, patient was altered, arousable to voice, mumbling with closed eyes. Temperature 99.2, heart rate elevated to 146, blood pressure was also elevated mostly to 140s. Labs showed creatinine elevated to 1.5, baseline normal, WBC elevated to 12.9, hemoglobin 10.1, platelets 582. Urine drug screen is positive for amphetamine, benzodiazepine and opiate. CT head normal. He received several liters of isotonic fluids. He was admitted to hospitalist service for further evaluation and management.  Subjective: Patient was seen and examined this morning.  Has mittens on.  Restless in bed.  Still remains tachycardic but improving.  Assessment/Plan: Toxic metabolic encephalopathy Polysubstance abuse -Brought in with significant altered mental status. Urine drug screen positive for amphetamine, benzodiazepine, opiate. -Patient remains pretty altered at this time.  He seems agitated and trying to get out of bed as well.  Has mittens on.  I will put him on  four-point restraints. -May benefit from scheduled sedatives as well. -Continue IV hydration. -Expect improvement in mental status once the effect of the drugs wear off.  -Continue to monitor on telemetry.  QTC 451 ms on EKG from 4/27. -Needs to be counseled once mental status improves  Chronic pain syndrome Anxiety/depression -Home meds include multiple mood altering medicines including benztropine, bupropion, Zyrtec, Remeron, Imitrex, Neurontin, Ibuprofen, meloxicam, Robaxin, Roxicodone,  Invega shots every 3 months -Because of significant altered mental status, I have all his blood pressure meds on hold.   -We will gradually resume, starting with benztropine, Robaxin today. -Continue to monitor mental status. -Of note, patient has chronic back pain, takes opioids at home. He had back surgery in January 2021. Lately, he has been complaining of incredible back pain pain and hallucinating as well.   Chronic alcoholism High risk of alcohol withdrawal -Currently on CIWA protocol with as needed Ativan.  I will also order for Librium scheduled at 25 mg 3 times daily with holding parameter.   Sepsis secondary aspiration pneumonia -Presented with tachycardic, mild leukocytosis, fever, altered mental status -Patient probably aspirated while intoxicated. -Temperature 100.3 in last 24 hours. -Started on IV Unasyn this morning.  Continue to monitor temperature and WBC trend.  Acute kidney injury -Creatinine was elevated to 1.54, baseline normal -Improved to normal level already with IV fluids.  Essential hypertension -Continue to monitor on amlodipine and losartan.   Vitamin B12 deficiency -Vitamin B12 level low at 147.  Start on B12 supplement.  GERD Chronic anemia -Continue Prilosec, Carafate -Continue to monitor hemoglobin.  Mobility: Encourage ambulation once mental status improves Code Status:  Full code  DVT prophylaxis:  Lovenox subcu Antimicrobials:  IV Unasyn Fluid: LR  at 125 mL/h to continue Diet: Cardiac diet  Consultants: None Family Communication:  Family not at bedside  Status is: Inpatient  Remains inpatient appropriate because:Hemodynamically unstable, Unsafe d/c plan, IV treatments appropriate due to intensity of illness or inability to take PO and Inpatient level of care appropriate due to severity of illness   Dispo: The patient is from: Home              Anticipated d/c is to: Home              Anticipated d/c date is: 3 days              Patient currently is not medically stable to d/c.   Antimicrobials: Anti-infectives (From admission, onward)   Start     Dose/Rate Route Frequency Ordered Stop   07/27/19 1100  Ampicillin-Sulbactam (UNASYN) 3 g in sodium chloride 0.9 % 100 mL IVPB     3 g 200 mL/hr over 30 Minutes Intravenous Every 6 hours 07/27/19 1001     07/26/19 0300  doxycycline (VIBRAMYCIN) 100 mg in sodium chloride 0.9 % 250 mL IVPB  Status:  Discontinued     100 mg 125 mL/hr over 120 Minutes Intravenous Every 12 hours 07/26/19 0209 07/26/19 1001   07/25/19 2345  cefTRIAXone (ROCEPHIN) 1 g in sodium chloride 0.9 % 100 mL IVPB     1 g 200 mL/hr over 30 Minutes Intravenous  Once 07/25/19 2343 07/26/19 0141        Code Status: Full Code   Diet Order            Diet 2 gram sodium Room service appropriate? Yes; Fluid consistency: Thin  Diet effective now              Infusions:  . ampicillin-sulbactam (UNASYN) IV      Scheduled Meds: . amLODipine  2.5 mg Oral Daily  . benztropine  1 mg Oral Daily  . chlordiazePOXIDE  25 mg Oral TID  . docusate sodium  100 mg Oral BID  . enoxaparin (LOVENOX) injection  40 mg Subcutaneous Q24H  . folic acid  1 mg Oral Daily  . gabapentin  300 mg Oral BID  . losartan  100 mg Oral Daily  . multivitamin with minerals  1 tablet Oral Daily  . pantoprazole  40 mg Oral Daily  . sucralfate  1 g Oral QID  . thiamine  100 mg Oral Daily   Or  . thiamine  100 mg Intravenous Daily  .  vitamin B-12  1,000 mcg Oral Daily    PRN meds: acetaminophen **OR** acetaminophen, albuterol, ibuprofen, LORazepam **OR** LORazepam, ondansetron **OR** ondansetron (ZOFRAN) IV, polyethylene glycol   Objective: Vitals:   07/27/19 0445 07/27/19 0900  BP: (!) 138/97 (!) 142/128  Pulse:  (!) 111  Resp: 20 20  Temp: 98.2 F (36.8 C) 97.9 F (36.6 C)  SpO2: 96% 100%    Intake/Output Summary (Last 24 hours) at 07/27/2019 1141 Last data filed at 07/27/2019 0500 Gross per 24 hour  Intake 2031.04 ml  Output 1500 ml  Net 531.04 ml   Filed Weights   07/26/19 1858  Weight: 94.3 kg   Weight change:  Body mass index is 29.83 kg/m.   Physical Exam: General exam: Not physical distress.  Restless in bed.  On mittens. Skin: No rashes, lesions or ulcers. HEENT: Atraumatic, normocephalic, supple neck, no obvious bleeding Lungs: Clear to auscultation bilaterally CVS: Tachycardic, regular rhythm, no  murmur GI/Abd soft, nontender, nondistended, bowel sound present CNS: Opens eyes on verbal command, falls back asleep quick.  Still not able to have a normal conversation. Psychiatry: Unable to examine mood Extremities: No pedal edema, no calf tenderness  Data Review: I have personally reviewed the laboratory data and studies available.  CBC: Recent Labs  Lab 07/25/19 1501 07/26/19 0819 07/27/19 0550  WBC 12.9* 9.2 10.9*  NEUTROABS 11.5* 6.8 8.8*  HGB 10.1* 9.1* 9.2*  HCT 33.1* 30.2* 29.8*  MCV 78.6* 78.6* 77.6*  PLT 582* 515* 466*    Basic Metabolic Panel: Recent Labs  Lab 07/25/19 1501 07/26/19 0819 07/27/19 0550  NA 137 131* 137  K 3.5 3.1* 3.2*  CL 105 98 101  CO2 20* 25 26  GLUCOSE 99 93 96  BUN 9 6 6   CREATININE 1.50* 0.80 0.63  CALCIUM 8.4* 8.2* 8.5*  MG  --  1.7 1.7  PHOS  --  3.3  --     Liver Function Tests: Recent Labs  Lab 07/25/19 1501 07/27/19 0550  AST 28 36  ALT 13 11  ALKPHOS 169* 136*  BILITOT 0.5 0.6  PROT 7.3 6.5  ALBUMIN 3.3* 2.9*   No  results for input(s): LIPASE, AMYLASE in the last 168 hours. Recent Labs  Lab 07/25/19 2345  AMMONIA 31    Cardiac Enzymes: Recent Labs  Lab 07/25/19 1501  CKTOTAL 475*    BNP (last 3 results) Recent Labs    07/25/19 2346  BNP 49.1   Lipase     Component Value Date/Time   LIPASE 12 07/03/2019 0326     Urinalysis    Component Value Date/Time   COLORURINE YELLOW 07/25/2019 1805   APPEARANCEUR HAZY (A) 07/25/2019 1805   LABSPEC 1.011 07/25/2019 1805   PHURINE 6.0 07/25/2019 1805   GLUCOSEU NEGATIVE 07/25/2019 1805   HGBUR SMALL (A) 07/25/2019 1805   BILIRUBINUR NEGATIVE 07/25/2019 1805   BILIRUBINUR negative 04/10/2016 1702   KETONESUR NEGATIVE 07/25/2019 1805   PROTEINUR 30 (A) 07/25/2019 1805   UROBILINOGEN 0.2 04/10/2016 1702   UROBILINOGEN 0.2 12/04/2013 1044   NITRITE NEGATIVE 07/25/2019 1805   LEUKOCYTESUR SMALL (A) 07/25/2019 1805     Drugs of Abuse     Component Value Date/Time   LABOPIA POSITIVE (A) 07/25/2019 1805   COCAINSCRNUR NONE DETECTED 07/25/2019 1805   LABBENZ POSITIVE (A) 07/25/2019 1805   AMPHETMU POSITIVE (A) 07/25/2019 1805   THCU NONE DETECTED 07/25/2019 1805   LABBARB NONE DETECTED 07/25/2019 1805     Signed, Lorin Glass, MD [facility] Pager: [phone] (Secure Chat preferred). 07/27/2019

## 2019-07-27 NOTE — Progress Notes (Signed)
Attempted echo.  Patient needed to use the rest room.  Will come back to try again later

## 2019-07-27 NOTE — Progress Notes (Signed)
Patient transferred to room 1236, report given.

## 2019-07-28 ENCOUNTER — Inpatient Hospital Stay (HOSPITAL_COMMUNITY): Payer: Medicare Other

## 2019-07-28 DIAGNOSIS — R4182 Altered mental status, unspecified: Secondary | ICD-10-CM | POA: Diagnosis not present

## 2019-07-28 DIAGNOSIS — F17210 Nicotine dependence, cigarettes, uncomplicated: Secondary | ICD-10-CM | POA: Diagnosis not present

## 2019-07-28 DIAGNOSIS — N179 Acute kidney failure, unspecified: Secondary | ICD-10-CM | POA: Diagnosis not present

## 2019-07-28 DIAGNOSIS — G894 Chronic pain syndrome: Secondary | ICD-10-CM | POA: Diagnosis not present

## 2019-07-28 DIAGNOSIS — I361 Nonrheumatic tricuspid (valve) insufficiency: Secondary | ICD-10-CM

## 2019-07-28 LAB — CBC
HCT: 30.3 % — ABNORMAL LOW (ref 39.0–52.0)
Hemoglobin: 9.4 g/dL — ABNORMAL LOW (ref 13.0–17.0)
MCH: 24 pg — ABNORMAL LOW (ref 26.0–34.0)
MCHC: 31 g/dL (ref 30.0–36.0)
MCV: 77.5 fL — ABNORMAL LOW (ref 80.0–100.0)
Platelets: 447 10*3/uL — ABNORMAL HIGH (ref 150–400)
RBC: 3.91 MIL/uL — ABNORMAL LOW (ref 4.22–5.81)
RDW: 16.2 % — ABNORMAL HIGH (ref 11.5–15.5)
WBC: 12.7 10*3/uL — ABNORMAL HIGH (ref 4.0–10.5)
nRBC: 0 % (ref 0.0–0.2)

## 2019-07-28 LAB — BASIC METABOLIC PANEL
Anion gap: 11 (ref 5–15)
BUN: 9 mg/dL (ref 6–20)
CO2: 23 mmol/L (ref 22–32)
Calcium: 8.5 mg/dL — ABNORMAL LOW (ref 8.9–10.3)
Chloride: 100 mmol/L (ref 98–111)
Creatinine, Ser: 0.65 mg/dL (ref 0.61–1.24)
GFR calc Af Amer: >60 mL/min (ref >60)
GFR calc non Af Amer: >60 mL/min (ref >60)
Glucose, Bld: 99 mg/dL (ref 70–99)
Potassium: 3.3 mmol/L — ABNORMAL LOW (ref 3.5–5.1)
Sodium: 134 mmol/L — ABNORMAL LOW (ref 135–145)

## 2019-07-28 LAB — GLUCOSE, CAPILLARY
Glucose-Capillary: 91 mg/dL (ref 70–99)
Glucose-Capillary: 94 mg/dL (ref 70–99)
Glucose-Capillary: 106 mg/dL — ABNORMAL HIGH (ref 70–99)
Glucose-Capillary: 109 mg/dL — ABNORMAL HIGH (ref 70–99)

## 2019-07-28 LAB — MAGNESIUM: Magnesium: 1.8 mg/dL (ref 1.7–2.4)

## 2019-07-28 LAB — PHOSPHORUS: Phosphorus: 3.5 mg/dL (ref 2.5–4.6)

## 2019-07-28 LAB — ECHOCARDIOGRAM COMPLETE
Height: 70 in
Weight: 3326.3 oz

## 2019-07-28 LAB — HIV ANTIBODY (ROUTINE TESTING W REFLEX): HIV Screen 4th Generation wRfx: NONREACTIVE

## 2019-07-28 MED ORDER — SODIUM CHLORIDE 0.9 % IV SOLN
2.0000 g | INTRAVENOUS | Status: DC
  Administered 2019-07-28 – 2019-07-31 (×16): 2 g via INTRAVENOUS
  Filled 2019-07-28 (×4): qty 2
  Filled 2019-07-28 (×2): qty 2000
  Filled 2019-07-28 (×2): qty 2
  Filled 2019-07-28 (×3): qty 2000
  Filled 2019-07-28 (×2): qty 2
  Filled 2019-07-28: qty 2000
  Filled 2019-07-28 (×2): qty 2
  Filled 2019-07-28 (×2): qty 2000
  Filled 2019-07-28: qty 2

## 2019-07-28 MED ORDER — FOLIC ACID 5 MG/ML IJ SOLN
1.0000 mg | Freq: Every day | INTRAMUSCULAR | Status: DC
  Administered 2019-07-28 – 2019-07-31 (×4): 1 mg via INTRAVENOUS
  Filled 2019-07-28 (×4): qty 0.2

## 2019-07-28 MED ORDER — KETOROLAC TROMETHAMINE 15 MG/ML IJ SOLN
15.0000 mg | Freq: Once | INTRAMUSCULAR | Status: AC
  Administered 2019-07-28: 15 mg via INTRAVENOUS
  Filled 2019-07-28: qty 1

## 2019-07-28 MED ORDER — MAGNESIUM SULFATE 2 GM/50ML IV SOLN
2.0000 g | Freq: Once | INTRAVENOUS | Status: AC
  Administered 2019-07-28: 08:00:00 2 g via INTRAVENOUS
  Filled 2019-07-28: qty 50

## 2019-07-28 MED ORDER — POTASSIUM CHLORIDE 10 MEQ/100ML IV SOLN
INTRAVENOUS | Status: AC
  Filled 2019-07-28: qty 100

## 2019-07-28 MED ORDER — SODIUM CHLORIDE 0.9 % IV SOLN
1.0000 g | Freq: Four times a day (QID) | INTRAVENOUS | Status: DC
  Filled 2019-07-28: qty 1000

## 2019-07-28 MED ORDER — VANCOMYCIN HCL 2000 MG/400ML IV SOLN
2000.0000 mg | Freq: Once | INTRAVENOUS | Status: AC
  Administered 2019-07-28: 2000 mg via INTRAVENOUS
  Filled 2019-07-28: qty 400

## 2019-07-28 MED ORDER — IBUPROFEN 800 MG PO TABS
800.0000 mg | ORAL_TABLET | Freq: Once | ORAL | Status: DC
  Filled 2019-07-28: qty 1

## 2019-07-28 MED ORDER — SODIUM CHLORIDE 0.9 % IV SOLN
2.0000 g | Freq: Once | INTRAVENOUS | Status: AC
  Administered 2019-07-28: 2 g via INTRAVENOUS
  Filled 2019-07-28: qty 2000

## 2019-07-28 MED ORDER — VANCOMYCIN HCL 1500 MG/300ML IV SOLN
1500.0000 mg | Freq: Two times a day (BID) | INTRAVENOUS | Status: DC
  Administered 2019-07-29 – 2019-07-31 (×5): 1500 mg via INTRAVENOUS
  Filled 2019-07-28 (×5): qty 300

## 2019-07-28 MED ORDER — POTASSIUM CHLORIDE CRYS ER 20 MEQ PO TBCR: 40.0000 meq | EXTENDED_RELEASE_TABLET | ORAL | Status: DC

## 2019-07-28 MED ORDER — SODIUM CHLORIDE 0.9 % IV SOLN
2.0000 g | Freq: Two times a day (BID) | INTRAVENOUS | Status: DC
  Administered 2019-07-28 – 2019-07-31 (×6): 2 g via INTRAVENOUS
  Filled 2019-07-28 (×3): qty 20
  Filled 2019-07-28 (×2): qty 2
  Filled 2019-07-28: qty 20
  Filled 2019-07-28: qty 2

## 2019-07-28 MED ORDER — CHLORHEXIDINE GLUCONATE CLOTH 2 % EX PADS
6.0000 | MEDICATED_PAD | Freq: Every day | CUTANEOUS | Status: DC
  Administered 2019-07-28 – 2019-08-01 (×5): 6 via TOPICAL

## 2019-07-28 MED ORDER — PANTOPRAZOLE SODIUM 40 MG IV SOLR
40.0000 mg | Freq: Every day | INTRAVENOUS | Status: DC
  Administered 2019-07-28 – 2019-07-29 (×2): 40 mg via INTRAVENOUS
  Filled 2019-07-28 (×2): qty 40

## 2019-07-28 MED ORDER — DEXTROSE 5 % IV SOLN
900.0000 mg | Freq: Three times a day (TID) | INTRAVENOUS | Status: DC
  Administered 2019-07-28 – 2019-08-04 (×19): 900 mg via INTRAVENOUS
  Filled 2019-07-28 (×21): qty 18

## 2019-07-28 MED ORDER — POTASSIUM CHLORIDE 10 MEQ/100ML IV SOLN
10.0000 meq | INTRAVENOUS | Status: AC
  Administered 2019-07-28 (×4): 10 meq via INTRAVENOUS
  Filled 2019-07-28 (×3): qty 100

## 2019-07-28 MED ORDER — METOPROLOL TARTRATE 5 MG/5ML IV SOLN
2.5000 mg | INTRAVENOUS | Status: DC | PRN
  Administered 2019-07-31: 2.5 mg via INTRAVENOUS
  Administered 2019-08-01: 5 mg via INTRAVENOUS
  Filled 2019-07-28 (×3): qty 5

## 2019-07-28 NOTE — Progress Notes (Addendum)
NAME:  [name], MRN:  [id number], DOB:  09-20-1965, LOS: 2 ADMISSION DATE:  07/25/2019, CONSULTATION DATE:  07/27/19 REFERRING MD:  Dr. Pola Corn, CHIEF COMPLAINT:  Alcohol withdrawal    Brief History   54 y/o M admitted 4/27 with polysubstance abuse and suspected ETOH withdrawal.    Past Medical History  ETOH Abuse CVA  HTN GERD Depression / Anxiety  Asthma  Duodenal Adenoma  Pre-Diabetes Right Knee Replacement  Chronic Back Pain   Significant Hospital Events   4/27 Presented to [facility] 4/28 Admit  Consults:  PCCM   Procedures:    Significant Diagnostic Tests:   ECHO 4/28 >>  CT Head 4/28 >> no acute intracranial abnormality, mild cerebral atrophy, chronic nasal bone fractures   Micro Data:  COVID 4/27 >> negative  Influenza A/B 4/27 >> negative  UC 4/27 >> negative  BCx2 4/27 >>   Antimicrobials:  Unasyn (empiric aspiration) 4/29 >>   Interim history/subjective:  On RA  WBC 12.7 / tmax 101 (currently afebrile, ? If this was during periods of agitation) Glucose range 90's I/O UOP, +917 in 24 hours Pt unable to take PO's / altered  Objective   Blood pressure (!) 196/122, pulse 88, temperature 98.5 F (36.9 C), temperature source Oral, resp. rate (!) 23, height 5\' 10"  (1.778 m), weight 94.3 kg, SpO2 100 %.        Intake/Output Summary (Last 24 hours) at 07/28/2019 0819 Last data filed at 07/28/2019 0630 Gross per 24 hour  Intake 1867.62 ml  Output 950 ml  Net 917.62 ml   Filed Weights   07/26/19 1858  Weight: 94.3 kg    Examination: General: adult male lying in bed in NAD  HEENT: MM pink/dry, anicteric, mumbles Neuro: awakens with stimulation, moves all extremities and then falls back to sleep  CV: s1s2 rrr, no m/r/g PULM:  Non-labored on room air, lungs bilaterally clear  GI: soft, bsx4 active  Extremities: warm/dry, no edema  Skin: no rashes or lesions  Resolved Hospital Problem list     Assessment & Plan:   Polysubstance  Abuse Suspected ETOH Withdrawal Chronic Pain  Anxiety / Depression  UDS positive for amphetamines, benzo's, opiates.  ETOH level <10 on admit. Girlfriend reports he uses Adderall, she indicates he does not drink but Care Everywhere review shows multiple calls / healthcare interactions regarding ETOH abuse.  -assess RPR, HIV,  -consider MRI brain if not waking (has had a right knee surgery, unclear if will be able to go into MRI). Notes in Care Everywhere indicate he saw Dr. August Saucer at [facility] (unable to see records). He has had joint injections / aspirations.  Also has had an ankle surgery of some sort based on scar.  He was oriented to place, name on 4/29.  -continue folate, MVI, thiamine  -reduce LR to 50 ml/hr -continue precedex infusion  -PRN ativan for seizure or agitation   Sinus Tachycardia Suspect volume down given clinical exam, AKI, substance abuse.  Doubt sepsis -Tele monitoring   Hypertension  -home medications ordered, if able to take PO's -PRN lopressor IV for SBP >170 -hold PO antihypertensives (losartan, amlodiine) as unable to take oral medications currently  AKI  Hypokalemia Hypomagnesemia  In setting of suspected volume depletion, resolving AKI.   -Trend BMP / urinary output -Replace electrolytes as indicated, KCL 4/30 -Avoid nephrotoxic agents, ensure adequate renal perfusion  Microcytic Anemia  Vitamin B12 Deficiency  -hold MVI, PO B12 supplementation -continue folate IV, thiamine -trend  CBC -transfuse for Hgb <7%  Mild Pulmonary Edema  Noted on CXR 4/27, BNP 49.  -follow up CXR this am reviewed without acute infiltrate  -stop unasyn   GERD  -ppi IV QD  -hold home carafate   Best practice:  Diet: per primary  Pain/Anxiety/Delirium protocol (if indicated): precedex VAP protocol (if indicated): n/a DVT prophylaxis: lovenox  GI prophylaxis: n/a  Glucose control: n/a Mobility: as tolerate Code Status: Full Code  Family Communication: Will  update family on arrival 4/30 Disposition: per primary   Labs   CBC: Recent Labs  Lab 07/25/19 1501 07/25/19 2345 07/26/19 0819 07/27/19 0550 07/28/19 0549  WBC 12.9*  --  9.2 10.9* 12.7*  NEUTROABS 11.5*  --  6.8 8.8*  --   HGB 10.1*  --  9.1* 9.2* 9.4*  HCT 33.1* 33.0* 30.2* 29.8* 30.3*  MCV 78.6*  --  78.6* 77.6* 77.5*  PLT 582*  --  515* 466* 447*    Basic Metabolic Panel: Recent Labs  Lab 07/25/19 1501 07/26/19 0819 07/27/19 0550 07/28/19 0549  NA 137 131* 137 134*  K 3.5 3.1* 3.2* 3.3*  CL 105 98 101 100  CO2 20* 25 26 23   GLUCOSE 99 93 96 99  BUN 9 6 6 9   CREATININE 1.50* 0.80 0.63 0.65  CALCIUM 8.4* 8.2* 8.5* 8.5*  MG  --  1.7 1.7 1.8  PHOS  --  3.3  --  3.5   GFR: Estimated Creatinine Clearance: 123.1 mL/min (by C-G formula based on SCr of 0.65 mg/dL). Recent Labs  Lab 07/25/19 1501 07/25/19 2346 07/26/19 0819 07/27/19 0550 07/28/19 0549  WBC 12.9*  --  9.2 10.9* 12.7*  LATICACIDVEN  --  1.1  --   --   --     Liver Function Tests: Recent Labs  Lab 07/25/19 1501 07/27/19 0550  AST 28 36  ALT 13 11  ALKPHOS 169* 136*  BILITOT 0.5 0.6  PROT 7.3 6.5  ALBUMIN 3.3* 2.9*   No results for input(s): LIPASE, AMYLASE in the last 168 hours. Recent Labs  Lab 07/25/19 2345  AMMONIA 31    ABG    Component Value Date/Time   TCO2 21 12/10/2009 2059     Coagulation Profile: No results for input(s): INR, PROTIME in the last 168 hours.  Cardiac Enzymes: Recent Labs  Lab 07/25/19 1501  CKTOTAL 475*    HbA1C: Hgb A1c MFr Bld  Date/Time Value Ref Range Status  07/27/2019 05:50 AM 6.6 (H) 4.8 - 5.6 % Final    Comment:    (NOTE) Pre diabetes:          5.7%-6.4% Diabetes:              >6.4% Glycemic control for   <7.0% adults with diabetes   04/07/2019 01:23 PM 5.6 4.8 - 5.6 % Final    Comment:    (NOTE) Pre diabetes:          5.7%-6.4% Diabetes:              >6.4% Glycemic control for   <7.0% adults with diabetes      CBG: Recent Labs  Lab 07/27/19 2132 07/28/19 0756  GLUCAP 93 91     Critical care time: 35 minutes     Canary Brim, MSN, NP-C  Pulmonary & Critical Care 07/28/2019, 8:19 AM   Please see [email or web address] for pager details.

## 2019-07-28 NOTE — Progress Notes (Addendum)
Significant other [name]) called for update. No answer, message left for return call.    Girlfriend returned call 4/30 pm. She indicates someone gave him an unknown drug for chronic back pain.  He was acting slow / odd, not himself 3-4 days prior to taking the medication.  She indicates he was "acting like he was senile".  She went out for a while and came back and when she came back he was gone and the door was open.  He reportedly had blisters on his mouth, on/off fevers & chills for "a while now since his back surgeries". Girlfriend is convinced that he is not using alcohol. Neither of them drive and there is not a store near by to walk to for alcohol.    She indicates he has had strokes in the past and difficulties with blood pressure management.  Neither of them have been feeling well for at least two weeks.  He has hepatitis C+.    Noe Gens, MSN, NP-C [facility] 07/28/2019, 4:27 PM   Please see [email or web address] for pager details.

## 2019-07-28 NOTE — Progress Notes (Signed)
eLink Physician-Brief Progress Note Patient Name: [name] DOB: 1966-03-09 MRN: [id number]   Date of Service  07/28/2019  HPI/Events of Note  Patient c/o back pain. Patient is on Advil and Neurontin at home. Creatinine = 0.63. Patient able to take liquids.   eICU Interventions  Will order: 1. Motrin 800 mg PO X 1 now.  2. Please give Neurontin dose that is already ordered.      Intervention Category Major Interventions: Other:  Lysle Dingwall 07/28/2019, 12:23 AM

## 2019-07-28 NOTE — Progress Notes (Signed)
  Echocardiogram 2D Echocardiogram has been performed.  [name] 07/28/2019, 11:58 AM

## 2019-07-28 NOTE — Progress Notes (Signed)
E-Link contacted for increase in precedex orders, CBG monitoring, and pain medication. Physician provided new orders for PO medications after observing patient drinking water w/o incident. Once I received the medication and tried to administer it to the patient he spit the pills back out at me. I was unable to give the patient anything other than water by mouth. He is able to follow directions, but has been unwilling to due to agitation. I will instruct dayshift nurse to ask for IV medications if patient is still uncooperative.   PT has not voided since start of shift. I bladder scanned him at approx. 0530 and the bladder showed to only have 149ml. It appears that he is continent but does not like voiding into condom cath. I have asked him to try to go for me and he is uncooperative.   With precedex prn ativan has also been given several times. The patient has intermittent bouts of moaning and shouting, but once stimulated in any way remains agitated for 5-10 minutes. I have tried to titrate the precedex several times without success.

## 2019-07-28 NOTE — Progress Notes (Signed)
Pharmacy Antibiotic Note  [name] is a 54 y.o. male admitted on 07/25/2019 with altered mental status.  Pharmacy has been consulted for acyclovir, vancomycin, ampicillin & ceftriaxone dosing for meningitis treatment.  Plan: Ceftriaxone 2 gm IV q12 Ampicillin 2 gm IV q4h Acyclovir 900 mg IV q8h (~ 10 mg/kg/dose) Vancomycin 2000 mg IV x 1 loading dose Vancomycin 1500 mg IV Q 12 hrs. Goal AUC 550-600 & Tr 15-20 for meningitis Expected AUC: 663  46.5/17 SCr used: 0.8 Vd = 0.5 Daily SCr  Height: 5\' 10"  (177.8 cm) Weight: 94.3 kg (207 lb 14.3 oz) IBW/kg (Calculated) : 73  Temp (24hrs), Avg:99 F (37.2 C), Min:97.8 F (36.6 C), Max:101.1 F (38.4 C)  Recent Labs  Lab 07/25/19 1501 07/25/19 2346 07/26/19 0819 07/27/19 0550 07/28/19 0549  WBC 12.9*  --  9.2 10.9* 12.7*  CREATININE 1.50*  --  0.80 0.63 0.65  LATICACIDVEN  --  1.1  --   --   --     Estimated Creatinine Clearance: 123.1 mL/min (by C-G formula based on SCr of 0.65 mg/dL).    No Known Allergies   Antimicrobials this admission: Ceftriaxone x1 4/28 >> 4/30 Doxycycline 4/28 > 4/29 Ampicillin/sulbactam 4/29 >> 4/30 4/30 ceftriaxone>> 4/30 ampicillin>> 4/30 acyclovir>> 4/30 vancomycin>>   Dose adjustments this admission:   Microbiology results: 4/27 Influenza A/B: neg, Covid: neg 4/27 RPR: nonreactive 4/27 HIV: nonreactive 4/27 BCx: ngtd 4/27 UCx: ngF  4/29 MRSA PCR: neg   Thank you for allowing pharmacy to be a part of this patient's care.  Eudelia Bunch, Pharm.D [phone] 07/28/2019 6:15 PM

## 2019-07-28 NOTE — Procedures (Signed)
[facility] Lumbar Puncture procedure attempt note  Indication: fever, confusion  Consent: I discussed the situation with the patient's mother who is a retired Therapist, sports, she gave consent  Radiographs were reviewed showing stabilization screws in L4-5  chlorhexadine was used to prep the skin at L2-3 and L3-4  1% lidocaine used for local anesthesia  The spinal needle could not be passed into the intrathecal space, encountered bone repeatedly despite frequent repositioning.  Procedure aborted, will ask radiology to perform it.  Roselie Awkward, MD [facility] Pager: [phone] Cell: [phone] If no response, call [phone]

## 2019-07-28 NOTE — Progress Notes (Addendum)
[facility] Attending  Earlier today girlfriend called again stating that they have both noticed some sort of rash around their mouths.  She also notes that he hasn't been drinking.  I called his mother Stanton Kidney (retired Marine scientist) and she gives permission for [place] to perform a lumbar puncture.  Addendum: LP attempted, could not access CSF Asking IR to perform Starting meningitis, viral encephalitis coverage with vanc, ceftriaxone, ampicillin, acyclovir  Roselie Awkward, MD [facility] Pager: [phone] Cell: [phone] If no response, call [phone]

## 2019-07-29 DIAGNOSIS — G92 Toxic encephalopathy: Secondary | ICD-10-CM

## 2019-07-29 DIAGNOSIS — G894 Chronic pain syndrome: Secondary | ICD-10-CM | POA: Diagnosis not present

## 2019-07-29 DIAGNOSIS — T50901D Poisoning by unspecified drugs, medicaments and biological substances, accidental (unintentional), subsequent encounter: Secondary | ICD-10-CM

## 2019-07-29 DIAGNOSIS — R410 Disorientation, unspecified: Secondary | ICD-10-CM | POA: Diagnosis not present

## 2019-07-29 DIAGNOSIS — F17210 Nicotine dependence, cigarettes, uncomplicated: Secondary | ICD-10-CM

## 2019-07-29 DIAGNOSIS — N179 Acute kidney failure, unspecified: Secondary | ICD-10-CM | POA: Diagnosis not present

## 2019-07-29 LAB — GLUCOSE, CAPILLARY
Glucose-Capillary: 92 mg/dL (ref 70–99)
Glucose-Capillary: 121 mg/dL — ABNORMAL HIGH (ref 70–99)
Glucose-Capillary: 95 mg/dL (ref 70–99)
Glucose-Capillary: 96 mg/dL (ref 70–99)

## 2019-07-29 LAB — RPR: RPR Ser Ql: NONREACTIVE

## 2019-07-29 LAB — CBC
HCT: 33.5 % — ABNORMAL LOW (ref 39.0–52.0)
Hemoglobin: 10.2 g/dL — ABNORMAL LOW (ref 13.0–17.0)
MCH: 23.8 pg — ABNORMAL LOW (ref 26.0–34.0)
MCHC: 30.4 g/dL (ref 30.0–36.0)
MCV: 78.1 fL — ABNORMAL LOW (ref 80.0–100.0)
Platelets: 520 10*3/uL — ABNORMAL HIGH (ref 150–400)
RBC: 4.29 MIL/uL (ref 4.22–5.81)
RDW: 16 % — ABNORMAL HIGH (ref 11.5–15.5)
WBC: 12.7 10*3/uL — ABNORMAL HIGH (ref 4.0–10.5)
nRBC: 0 % (ref 0.0–0.2)

## 2019-07-29 LAB — BASIC METABOLIC PANEL
Anion gap: 13 (ref 5–15)
BUN: 9 mg/dL (ref 6–20)
CO2: 20 mmol/L — ABNORMAL LOW (ref 22–32)
Calcium: 8.3 mg/dL — ABNORMAL LOW (ref 8.9–10.3)
Chloride: 101 mmol/L (ref 98–111)
Creatinine, Ser: 0.64 mg/dL (ref 0.61–1.24)
GFR calc Af Amer: >60 mL/min (ref >60)
GFR calc non Af Amer: >60 mL/min (ref >60)
Glucose, Bld: 93 mg/dL (ref 70–99)
Potassium: 3.5 mmol/L (ref 3.5–5.1)
Sodium: 134 mmol/L — ABNORMAL LOW (ref 135–145)

## 2019-07-29 MED ORDER — PANTOPRAZOLE SODIUM 40 MG PO TBEC
40.0000 mg | DELAYED_RELEASE_TABLET | Freq: Every day | ORAL | Status: DC
  Administered 2019-07-31 – 2019-09-05 (×37): 40 mg via ORAL
  Filled 2019-07-29 (×38): qty 1

## 2019-07-29 MED ORDER — LOSARTAN POTASSIUM 50 MG PO TABS
50.0000 mg | ORAL_TABLET | Freq: Every day | ORAL | Status: DC
  Administered 2019-07-29 – 2019-09-05 (×38): 50 mg via ORAL
  Filled 2019-07-29 (×39): qty 1

## 2019-07-29 MED ORDER — AMLODIPINE BESYLATE 5 MG PO TABS
5.0000 mg | ORAL_TABLET | Freq: Every day | ORAL | Status: DC
  Administered 2019-07-29 – 2019-08-02 (×5): 5 mg via ORAL
  Filled 2019-07-29 (×5): qty 1

## 2019-07-29 NOTE — Progress Notes (Signed)
NAME:  [name], MRN:  [id number], DOB:  [birth year], LOS: 3 ADMISSION DATE:  07/25/2019, CONSULTATION DATE:  07/27/19 REFERRING MD:  Dr. Pola Corn, CHIEF COMPLAINT:  Alcohol withdrawal    Brief History   54 y/o M admitted 4/27 with polysubstance abuse and suspected ETOH withdrawal.    Past Medical History  ETOH Abuse CVA  HTN GERD Depression / Anxiety  Asthma  Duodenal Adenoma  Pre-Diabetes Right Knee Replacement  Chronic Back Pain   Significant Hospital Events   4/27 Presented to [facility] 4/28 Admit  Consults:  PCCM   Procedures:  Lumbar puncture 4/30-unsuccessful  Significant Diagnostic Tests:   ECHO 4/28 >>  CT Head 4/28 >> no acute intracranial abnormality, mild cerebral atrophy, chronic nasal bone fractures   Micro Data:  COVID 4/27 >> negative  Influenza A/B 4/27 >> negative  UC 4/27 >> negative  BCx2 4/27 >>   Antimicrobials:  Unasyn (empiric aspiration) 4/29  Acyclovir 4/30>>  ampicillin 4/30>> Ceftriaxone 4/30>> Vancomycin 4/30>> Doxycycline 4/27 Interim history/subjective:  On RA  Awake and interactive  Objective   Blood pressure (!) 164/100, pulse 90, temperature (!) 97.2 F (36.2 C), temperature source Oral, resp. rate 20, height 5\' 10"  (1.778 m), weight 94.3 kg, SpO2 100 %.        Intake/Output Summary (Last 24 hours) at 07/29/2019 1331 Last data filed at 07/29/2019 1244 Gross per 24 hour  Intake 2153.84 ml  Output 3000 ml  Net -846.16 ml   Filed Weights   07/26/19 1858  Weight: 94.3 kg    Examination: General: adult male lying in bed in NAD  HEENT: Moist oral mucosa Neuro: Moving all extremities CV: s1s2 rrr, no m/r/g PULM:  Non-labored on room air, lungs bilaterally clear  GI: soft, bsx4 active   Resolved Hospital Problem list     Assessment & Plan:   Polysubstance Abuse Suspected ETOH Withdrawal Chronic Pain  Anxiety / Depression  UDS positive for amphetamines, benzo's, opiates.  ETOH level <10 on admit. Girlfriend  reports he uses Adderall, she indicates he does not drink but Care Everywhere review shows multiple calls / healthcare interactions regarding ETOH abuse.  -HIV, RPR negative -consider MRI brain if not waking (has had a right knee surgery, unclear if will be able to go into MRI). Notes in Care Everywhere indicate he saw Dr. August Saucer at [facility] (unable to see records). He has had joint injections / aspirations.  Also has had an ankle surgery of some sort based on scar.  He was oriented to place, name on 4/29.-More interactive -continue folate, MVI, thiamine  -Continue LR -continue precedex infusion  -PRN ativan for seizure or agitation   Concern for meningitis -Started on medications for meningitis-acyclovir, ampicillin, ceftriaxone, vancomycin -LP delayed to 5/2-patient had Lovenox this morning -Rescheduled for 5/2  Sinus Tachycardia -Continue to monitor  Hypertension   -home medications ordered, if able to take PO's -PRN lopressor IV for SBP >170 -Resume home medications-amlodipine 5 mg, losartan 50-increase as tolerated  AKI  Hypokalemia Hypomagnesemia  -AKI improved -Avoid nephrotoxic agents, ensure adequate renal perfusion  Microcytic Anemia  Vitamin B12 Deficiency  -hold MVI, PO B12 supplementation -continue folate IV, thiamine -trend CBC -transfuse for Hgb <7%  Mild Pulmonary Edema  Chest x-ray 4/30 reviewed-improved  GERD  -Protonix p.o.  Best practice:  Diet: per primary  Pain/Anxiety/Delirium protocol (if indicated): precedex VAP protocol (if indicated): n/a DVT prophylaxis: lovenox  GI prophylaxis: n/a  Glucose control: n/a Mobility: as tolerate Code Status:  Full Code  Family Communication: Will update family on arrival 4/30 Disposition: per primary   Labs   CBC: Recent Labs  Lab 07/25/19 1501 07/25/19 2345 07/26/19 0819 07/27/19 0550 07/28/19 0549 07/29/19 0240  WBC 12.9*  --  9.2 10.9* 12.7* 12.7*  NEUTROABS 11.5*  --  6.8 8.8*  --   --    HGB 10.1*  --  9.1* 9.2* 9.4* 10.2*  HCT 33.1* 33.0* 30.2* 29.8* 30.3* 33.5*  MCV 78.6*  --  78.6* 77.6* 77.5* 78.1*  PLT 582*  --  515* 466* 447* 520*    Basic Metabolic Panel: Recent Labs  Lab 07/25/19 1501 07/26/19 0819 07/27/19 0550 07/28/19 0549 07/29/19 0240  NA 137 131* 137 134* 134*  K 3.5 3.1* 3.2* 3.3* 3.5  CL 105 98 101 100 101  CO2 20* 25 26 23  20*  GLUCOSE 99 93 96 99 93  BUN 9 6 6 9 9   CREATININE 1.50* 0.80 0.63 0.65 0.64  CALCIUM 8.4* 8.2* 8.5* 8.5* 8.3*  MG  --  1.7 1.7 1.8  --   PHOS  --  3.3  --  3.5  --    GFR: Estimated Creatinine Clearance: 123.1 mL/min (by C-G formula based on SCr of 0.64 mg/dL). Recent Labs  Lab 07/25/19 1501 07/25/19 2346 07/26/19 0819 07/27/19 0550 07/28/19 0549 07/29/19 0240  WBC   < >  --  9.2 10.9* 12.7* 12.7*  LATICACIDVEN  --  1.1  --   --   --   --    < > = values in this interval not displayed.    Liver Function Tests: Recent Labs  Lab 07/25/19 1501 07/27/19 0550  AST 28 36  ALT 13 11  ALKPHOS 169* 136*  BILITOT 0.5 0.6  PROT 7.3 6.5  ALBUMIN 3.3* 2.9*   No results for input(s): LIPASE, AMYLASE in the last 168 hours. Recent Labs  Lab 07/25/19 2345  AMMONIA 31    ABG    Component Value Date/Time   TCO2 21 12/10/2009 2059     Coagulation Profile: No results for input(s): INR, PROTIME in the last 168 hours.  Cardiac Enzymes: Recent Labs  Lab 07/25/19 1501  CKTOTAL 475*    HbA1C: Hgb A1c MFr Bld  Date/Time Value Ref Range Status  07/27/2019 05:50 AM 6.6 (H) 4.8 - 5.6 % Final    Comment:    (NOTE) Pre diabetes:          5.7%-6.4% Diabetes:              >6.4% Glycemic control for   <7.0% adults with diabetes   04/07/2019 01:23 PM 5.6 4.8 - 5.6 % Final    Comment:    (NOTE) Pre diabetes:          5.7%-6.4% Diabetes:              >6.4% Glycemic control for   <7.0% adults with diabetes     CBG: Recent Labs  Lab 07/28/19 1139 07/28/19 1626 07/28/19 2114 07/29/19 0825  07/29/19 1157  GLUCAP 94 106* 109* 92 121*    The patient is critically ill with multiple organ systems failure and requires high complexity decision making for assessment and support, frequent evaluation and titration of therapies, application of advanced monitoring technologies and extensive interpretation of multiple databases. Critical Care Time devoted to patient care services described in this note independent of APP/resident time (if applicable)  is 32 minutes.   Virl Diamond MD [facility] Personal pager: [phone] If unanswered, please page CCM On-call: #[phone]

## 2019-07-30 ENCOUNTER — Inpatient Hospital Stay (HOSPITAL_COMMUNITY): Payer: Medicare Other

## 2019-07-30 DIAGNOSIS — T50901D Poisoning by unspecified drugs, medicaments and biological substances, accidental (unintentional), subsequent encounter: Secondary | ICD-10-CM | POA: Diagnosis not present

## 2019-07-30 DIAGNOSIS — G92 Toxic encephalopathy: Secondary | ICD-10-CM | POA: Diagnosis not present

## 2019-07-30 DIAGNOSIS — R6510 Systemic inflammatory response syndrome (SIRS) of non-infectious origin without acute organ dysfunction: Secondary | ICD-10-CM

## 2019-07-30 DIAGNOSIS — N179 Acute kidney failure, unspecified: Secondary | ICD-10-CM | POA: Diagnosis not present

## 2019-07-30 LAB — GLUCOSE, CAPILLARY
Glucose-Capillary: 139 mg/dL — ABNORMAL HIGH (ref 70–99)
Glucose-Capillary: 117 mg/dL — ABNORMAL HIGH (ref 70–99)
Glucose-Capillary: 104 mg/dL — ABNORMAL HIGH (ref 70–99)
Glucose-Capillary: 111 mg/dL — ABNORMAL HIGH (ref 70–99)

## 2019-07-30 LAB — CSF CELL COUNT WITH DIFFERENTIAL
Eosinophils, CSF: 0 % (ref 0–1)
Lymphs, CSF: 87 % — ABNORMAL HIGH (ref 40–80)
Monocyte-Macrophage-Spinal Fluid: 12 % — ABNORMAL LOW (ref 15–45)
RBC Count, CSF: 23 /mm3 — ABNORMAL HIGH
Segmented Neutrophils-CSF: 1 % (ref 0–6)
Tube #: 4
WBC, CSF: 16 /mm3 (ref 0–5)

## 2019-07-30 LAB — GLUCOSE, CSF: Glucose, CSF: 46 mg/dL (ref 40–70)

## 2019-07-30 LAB — PROTEIN, CSF: Total  Protein, CSF: >600 mg/dL — ABNORMAL HIGH (ref 15–45)

## 2019-07-30 NOTE — Progress Notes (Signed)
Weeks of back pain  No alcohol for years- she is adamant that [name] has not had a drink for many years now.  On medications that he takes 3 monthly for schizophrenia that he may be due for at present   She has a febrile illness with oral blisters- not sure that this may not be HSV   Spoke with [name], Eddys significant other

## 2019-07-30 NOTE — Progress Notes (Signed)
CSF glucose within normal limits, elevated WBC  Normal glucose may mean bacterial meningitis is less likely Protein pending hsv pending  Continue lines of care

## 2019-07-30 NOTE — Progress Notes (Signed)
NAME:  [name], MRN:  [id number], DOB:  30-May-1965, LOS: 4 ADMISSION DATE:  07/25/2019, CONSULTATION DATE:  07/27/19 REFERRING MD:  Dr. Pola Corn, CHIEF COMPLAINT:  Alcohol withdrawal    Brief History   54 y/o M admitted 4/27 with polysubstance abuse and suspected ETOH withdrawal.    Past Medical History  ETOH Abuse CVA  HTN GERD Depression / Anxiety  Asthma  Duodenal Adenoma  Pre-Diabetes Right Knee Replacement  Chronic Back Pain   Significant Hospital Events   4/27 Presented to [facility] 4/28 Admit  Consults:  PCCM   Procedures:  Lumbar puncture 4/30-unsuccessful  Significant Diagnostic Tests:   ECHO 4/28 >>  CT Head 4/28 >> no acute intracranial abnormality, mild cerebral atrophy, chronic nasal bone fractures   Micro Data:  COVID 4/27 >> negative  Influenza A/B 4/27 >> negative  UC 4/27 >> negative  BCx2 4/27 >>   Antimicrobials:  Unasyn (empiric aspiration) 4/29  Acyclovir 4/30>>  ampicillin 4/30>> Ceftriaxone 4/30>> Vancomycin 4/30>> Doxycycline 4/27 Interim history/subjective:  He is on room air Is sedated-on Precedex at present  Objective   Blood pressure (!) 170/95, pulse 89, temperature 98.2 F (36.8 C), temperature source Oral, resp. rate (!) 21, height 5\' 10"  (1.778 m), weight 94.3 kg, SpO2 97 %.        Intake/Output Summary (Last 24 hours) at 07/30/2019 1040 Last data filed at 07/30/2019 4403 Gross per 24 hour  Intake 3120.13 ml  Output 4550 ml  Net -1429.87 ml   Filed Weights   07/26/19 1858  Weight: 94.3 kg    Examination: General: Adult male, does not appear to be in respiratory distress HEENT: Moist oral mucosa Neuro: Moving all extremities, he is sedated CV: s1s2 rrr, no m/r/g PULM: Clear breath sounds bilaterally GI: soft, bsx4 active   Resolved Hospital Problem list     Assessment & Plan:   Polysubstance Abuse Suspected ETOH Withdrawal Chronic Pain  Anxiety / Depression  UDS positive for amphetamines, benzo's, opiates.   ETOH level <10 on admit. Girlfriend reports he uses Adderall, she indicates he does not drink but Care Everywhere review shows multiple calls / healthcare interactions regarding ETOH abuse.  -HIV, RPR negative -consider MRI brain if not waking (has had a right knee surgery, unclear if will be able to go into MRI). Notes in Care Everywhere indicate he saw Dr. August Saucer at [facility] (unable to see records). He has had joint injections / aspirations.  Also has had an ankle surgery of some sort based on scar.  He was oriented to place, name on 4/29.-More interactive -continue folate, MVI, thiamine  -Continue LR -continue precedex infusion-dose increased -Required multiple doses of benzos through the night -PRN ativan for seizure or agitation   Concern for meningitis -Started on medications for meningitis-acyclovir, ampicillin, ceftriaxone, vancomycin -LP delayed to 5/2-patient had Lovenox this morning -Rescheduled for 5/2 -Continue medications at present for possible meningitis  Sinus Tachycardia -Continue to monitor  Hypertension   -home medications ordered, if able to take PO's -PRN lopressor IV for SBP >170 -Resume home medications-amlodipine 5 mg, losartan 50-increase as tolerated  AKI  Hypokalemia Hypomagnesemia  -AKI improved -Avoid nephrotoxic agents, ensure adequate renal perfusion  Microcytic Anemia  Vitamin B12 Deficiency  -hold MVI, PO B12 supplementation -continue folate IV, thiamine -trend CBC -transfuse for Hgb <7%  Mild Pulmonary Edema  Chest x-ray 4/30 reviewed-improved  GERD  -Protonix p.o.  Order labs for a.m. Best practice:  Diet: per primary  Pain/Anxiety/Delirium protocol (if  indicated): precedex VAP protocol (if indicated): n/a DVT prophylaxis: lovenox  GI prophylaxis: n/a  Glucose control: n/a Mobility: as tolerate Code Status: Full Code  Family Communication: Will update family on arrival 4/30 Disposition: per primary   Labs   CBC: Recent  Labs  Lab 07/25/19 1501 07/25/19 2345 07/26/19 0819 07/27/19 0550 07/28/19 0549 07/29/19 0240  WBC 12.9*  --  9.2 10.9* 12.7* 12.7*  NEUTROABS 11.5*  --  6.8 8.8*  --   --   HGB 10.1*  --  9.1* 9.2* 9.4* 10.2*  HCT 33.1* 33.0* 30.2* 29.8* 30.3* 33.5*  MCV 78.6*  --  78.6* 77.6* 77.5* 78.1*  PLT 582*  --  515* 466* 447* 520*    Basic Metabolic Panel: Recent Labs  Lab 07/25/19 1501 07/26/19 0819 07/27/19 0550 07/28/19 0549 07/29/19 0240  NA 137 131* 137 134* 134*  K 3.5 3.1* 3.2* 3.3* 3.5  CL 105 98 101 100 101  CO2 20* 25 26 23  20*  GLUCOSE 99 93 96 99 93  BUN 9 6 6 9 9   CREATININE 1.50* 0.80 0.63 0.65 0.64  CALCIUM 8.4* 8.2* 8.5* 8.5* 8.3*  MG  --  1.7 1.7 1.8  --   PHOS  --  3.3  --  3.5  --    GFR: Estimated Creatinine Clearance: 123.1 mL/min (by C-G formula based on SCr of 0.64 mg/dL). Recent Labs  Lab 07/25/19 1501 07/25/19 2346 07/26/19 0819 07/27/19 0550 07/28/19 0549 07/29/19 0240  WBC   < >  --  9.2 10.9* 12.7* 12.7*  LATICACIDVEN  --  1.1  --   --   --   --    < > = values in this interval not displayed.    Liver Function Tests: Recent Labs  Lab 07/25/19 1501 07/27/19 0550  AST 28 36  ALT 13 11  ALKPHOS 169* 136*  BILITOT 0.5 0.6  PROT 7.3 6.5  ALBUMIN 3.3* 2.9*   No results for input(s): LIPASE, AMYLASE in the last 168 hours. Recent Labs  Lab 07/25/19 2345  AMMONIA 31    ABG    Component Value Date/Time   TCO2 21 12/10/2009 2059     Coagulation Profile: No results for input(s): INR, PROTIME in the last 168 hours.  Cardiac Enzymes: Recent Labs  Lab 07/25/19 1501  CKTOTAL 475*    HbA1C: Hgb A1c MFr Bld  Date/Time Value Ref Range Status  07/27/2019 05:50 AM 6.6 (H) 4.8 - 5.6 % Final    Comment:    (NOTE) Pre diabetes:          5.7%-6.4% Diabetes:              >6.4% Glycemic control for   <7.0% adults with diabetes   04/07/2019 01:23 PM 5.6 4.8 - 5.6 % Final    Comment:    (NOTE) Pre diabetes:           5.7%-6.4% Diabetes:              >6.4% Glycemic control for   <7.0% adults with diabetes     CBG: Recent Labs  Lab 07/29/19 0825 07/29/19 1157 07/29/19 1558 07/29/19 2128 07/30/19 0756  GLUCAP 92 121* 95 96 139*    The patient is critically ill with multiple organ systems failure and requires high complexity decision making for assessment and support, frequent evaluation and titration of therapies, application of advanced monitoring technologies and extensive interpretation of multiple databases. Critical Care Time devoted to patient care  services described in this note independent of APP/resident time (if applicable)  is 30 minutes.   Virl Diamond MD [facility] Personal pager: [phone] If unanswered, please page CCM On-call: #[phone]

## 2019-07-30 NOTE — Progress Notes (Signed)
Notified by central telemetry all leads off. Entered room 3 other staff members present. Per staff nurse Patient undressed and OOB on his knees at side of bed when she  entered room.  Possible unwitnessed fall patient denies pain or hitting his head. Patient assisted back to bed and bed alarm on. VS stable and Dr.Adewale Olalere, on unit and notified.

## 2019-07-30 NOTE — Progress Notes (Signed)
eLink Physician-Brief Progress Note Patient Name: ZALMEN SADEK DOB: Jul 20, 1965 MRN: [id number]   Date of Service  07/30/2019  HPI/Events of Note  Pt with persistent delirium.  eICU Interventions  Precedex ceiling increased to 1.2 mcg, continue PRN Ativan, 4 point soft restraints ordered.        Kerry Kass Avon Molock 07/30/2019, 1:57 AM

## 2019-07-30 NOTE — Progress Notes (Signed)
Patient was agitated, restless and anxious throught the night even though he was given a total of Ativan 6 mg.  Patient can't be redirected and not following commands.  He was uneasy on his bed and  tried to get out several times. He took out his IV line and also pulling his external catheter. RN called E-link/ Higinio Plan RN to ask for additional assessment & medication. E-link and MD ordered restraints and increase Precedex to 1.2 mcg.  Started the restraints and increase Precedex as ordered. Will continue to monitor the patient's safety.

## 2019-07-30 NOTE — Procedures (Signed)
CLINICAL DATA: Acute encephalopathy  EXAM:  DIAGNOSTIC LUMBAR PUNCTURE UNDER FLUOROSCOPIC GUIDANCE  FLUOROSCOPY TIME: Fluoroscopy Time:  0 minutes, 30 seconds  Radiation Exposure Index (if provided by the fluoroscopic device):  5.9 mGy  Number of Acquired Spot Images: 0  PROCEDURE:  I discussed the risks (including hemorrhage, infection, and nerve damage, among others), benefits, and alternatives to fluoroscopically guided lumbar puncture with patient's mother by telephone, as the patient is unable to consent and has altered mental status.  We specifically discussed the high likelihood of technical success of the procedure.  The patient's mother understood and elected for the patient undergo the procedure.   Standard time-out was employed.  Following sterile skin prep and local anesthetic administration consisting of 1 percent lidocaine, a 20 gauge spinal needle was advanced without difficulty into the thecal sac at the L4-5 level under fluoroscopic guidance.  Initially blood tinged but subsequent clearing CSF was returned.  CSF pressure was subjectively low to normal, a quantitative measurement was not obtained.   A total of 10 cc of CSF was collected in 4 vials.  The needle was subsequently removed and the skin cleansed and bandaged.  No immediate complications were observed.   IMPRESSION:    Technically successful fluoroscopically guided lumbar puncture at the L4-5 level, without immediate complication.

## 2019-07-31 ENCOUNTER — Inpatient Hospital Stay (HOSPITAL_COMMUNITY): Payer: Medicare Other

## 2019-07-31 DIAGNOSIS — G9340 Encephalopathy, unspecified: Secondary | ICD-10-CM

## 2019-07-31 DIAGNOSIS — G92 Toxic encephalopathy: Secondary | ICD-10-CM | POA: Diagnosis not present

## 2019-07-31 LAB — GLUCOSE, CAPILLARY
Glucose-Capillary: 132 mg/dL — ABNORMAL HIGH (ref 70–99)
Glucose-Capillary: 89 mg/dL (ref 70–99)
Glucose-Capillary: 67 mg/dL — ABNORMAL LOW (ref 70–99)
Glucose-Capillary: 101 mg/dL — ABNORMAL HIGH (ref 70–99)

## 2019-07-31 LAB — VANCOMYCIN, TROUGH: Vancomycin Tr: 16 ug/mL (ref 15–20)

## 2019-07-31 LAB — CULTURE, BLOOD (ROUTINE X 2)
Culture: NO GROWTH
Culture: NO GROWTH
Special Requests: ADEQUATE

## 2019-07-31 LAB — CBC WITH DIFFERENTIAL/PLATELET
Abs Immature Granulocytes: 0.04 10*3/uL (ref 0.00–0.07)
Basophils Absolute: 0 10*3/uL (ref 0.0–0.1)
Basophils Relative: 0 %
Eosinophils Absolute: 0.1 10*3/uL (ref 0.0–0.5)
Eosinophils Relative: 1 %
HCT: 34.3 % — ABNORMAL LOW (ref 39.0–52.0)
Hemoglobin: 10.2 g/dL — ABNORMAL LOW (ref 13.0–17.0)
Immature Granulocytes: 0 %
Lymphocytes Relative: 14 %
Lymphs Abs: 1.3 10*3/uL (ref 0.7–4.0)
MCH: 23.6 pg — ABNORMAL LOW (ref 26.0–34.0)
MCHC: 29.7 g/dL — ABNORMAL LOW (ref 30.0–36.0)
MCV: 79.4 fL — ABNORMAL LOW (ref 80.0–100.0)
Monocytes Absolute: 0.8 10*3/uL (ref 0.1–1.0)
Monocytes Relative: 9 %
Neutro Abs: 7.1 10*3/uL (ref 1.7–7.7)
Neutrophils Relative %: 76 %
Platelets: 511 10*3/uL — ABNORMAL HIGH (ref 150–400)
RBC: 4.32 MIL/uL (ref 4.22–5.81)
RDW: 16.3 % — ABNORMAL HIGH (ref 11.5–15.5)
WBC: 9.4 10*3/uL (ref 4.0–10.5)
nRBC: 0 % (ref 0.0–0.2)

## 2019-07-31 LAB — BASIC METABOLIC PANEL
Anion gap: 12 (ref 5–15)
BUN: 8 mg/dL (ref 6–20)
CO2: 22 mmol/L (ref 22–32)
Calcium: 8.5 mg/dL — ABNORMAL LOW (ref 8.9–10.3)
Chloride: 102 mmol/L (ref 98–111)
Creatinine, Ser: 0.67 mg/dL (ref 0.61–1.24)
GFR calc Af Amer: >60 mL/min (ref >60)
GFR calc non Af Amer: >60 mL/min (ref >60)
Glucose, Bld: 112 mg/dL — ABNORMAL HIGH (ref 70–99)
Potassium: 2.8 mmol/L — ABNORMAL LOW (ref 3.5–5.1)
Sodium: 136 mmol/L (ref 135–145)

## 2019-07-31 LAB — C-REACTIVE PROTEIN: CRP: 10 mg/dL — ABNORMAL HIGH (ref <1.0)

## 2019-07-31 LAB — PATHOLOGIST SMEAR REVIEW

## 2019-07-31 LAB — SEDIMENTATION RATE: Sed Rate: 74 mm/hr — ABNORMAL HIGH (ref 0–16)

## 2019-07-31 MED ORDER — VANCOMYCIN HCL 1500 MG/300ML IV SOLN
1500.0000 mg | Freq: Two times a day (BID) | INTRAVENOUS | Status: DC
  Administered 2019-07-31 – 2019-08-15 (×31): 1500 mg via INTRAVENOUS
  Filled 2019-07-31 (×31): qty 300

## 2019-07-31 MED ORDER — SODIUM CHLORIDE 0.9 % IV SOLN: 1.0000 g | Freq: Four times a day (QID) | INTRAVENOUS | Status: DC

## 2019-07-31 MED ORDER — SODIUM CHLORIDE 0.9 % IV SOLN: 2.0000 g | INTRAVENOUS | Status: DC

## 2019-07-31 MED ORDER — POTASSIUM CHLORIDE 10 MEQ/100ML IV SOLN
10.0000 meq | INTRAVENOUS | Status: AC
  Administered 2019-07-31 (×6): 10 meq via INTRAVENOUS
  Filled 2019-07-31 (×6): qty 100

## 2019-07-31 MED ORDER — BUPROPION HCL ER (XL) 300 MG PO TB24
300.0000 mg | ORAL_TABLET | Freq: Every day | ORAL | Status: DC
  Administered 2019-07-31 – 2019-09-05 (×37): 300 mg via ORAL
  Filled 2019-07-31 (×37): qty 1

## 2019-07-31 MED ORDER — POTASSIUM CHLORIDE CRYS ER 20 MEQ PO TBCR
40.0000 meq | EXTENDED_RELEASE_TABLET | Freq: Once | ORAL | Status: AC
  Administered 2019-07-31: 40 meq via ORAL
  Filled 2019-07-31: qty 2

## 2019-07-31 MED ORDER — CYANOCOBALAMIN 1000 MCG/ML IJ SOLN
1000.0000 ug | Freq: Every day | INTRAMUSCULAR | Status: AC
  Administered 2019-07-31 – 2019-08-06 (×7): 1000 ug via SUBCUTANEOUS
  Filled 2019-07-31 (×7): qty 1

## 2019-07-31 MED ORDER — SODIUM CHLORIDE 0.9 % IV SOLN
2.0000 g | Freq: Two times a day (BID) | INTRAVENOUS | Status: DC
  Administered 2019-07-31 – 2019-08-07 (×14): 2 g via INTRAVENOUS
  Filled 2019-07-31 (×2): qty 2
  Filled 2019-07-31: qty 20
  Filled 2019-07-31 (×5): qty 2
  Filled 2019-07-31 (×2): qty 20
  Filled 2019-07-31 (×6): qty 2

## 2019-07-31 MED ORDER — GABAPENTIN 300 MG PO CAPS
300.0000 mg | ORAL_CAPSULE | Freq: Two times a day (BID) | ORAL | Status: DC
  Administered 2019-07-31 – 2019-09-05 (×73): 300 mg via ORAL
  Filled 2019-07-31 (×73): qty 1

## 2019-07-31 MED ORDER — OXYCODONE-ACETAMINOPHEN 5-325 MG PO TABS
1.0000 | ORAL_TABLET | Freq: Four times a day (QID) | ORAL | Status: DC | PRN
  Administered 2019-07-31 – 2019-08-01 (×4): 1 via ORAL
  Filled 2019-07-31 (×4): qty 1

## 2019-07-31 MED ORDER — SODIUM CHLORIDE 0.9 % IV SOLN
2.0000 g | INTRAVENOUS | Status: DC
  Administered 2019-07-31 – 2019-08-07 (×39): 2 g via INTRAVENOUS
  Filled 2019-07-31: qty 2
  Filled 2019-07-31 (×2): qty 2000
  Filled 2019-07-31 (×3): qty 2
  Filled 2019-07-31: qty 2000
  Filled 2019-07-31: qty 2
  Filled 2019-07-31: qty 2000
  Filled 2019-07-31 (×3): qty 2
  Filled 2019-07-31: qty 2000
  Filled 2019-07-31 (×24): qty 2
  Filled 2019-07-31: qty 2000
  Filled 2019-07-31 (×4): qty 2

## 2019-07-31 MED ORDER — ENOXAPARIN SODIUM 40 MG/0.4ML ~~LOC~~ SOLN
40.0000 mg | Freq: Every day | SUBCUTANEOUS | Status: DC
  Administered 2019-07-31 – 2019-09-05 (×37): 40 mg via SUBCUTANEOUS
  Filled 2019-07-31 (×37): qty 0.4

## 2019-07-31 MED ORDER — FOLIC ACID 1 MG PO TABS
1.0000 mg | ORAL_TABLET | Freq: Every day | ORAL | Status: DC
  Administered 2019-08-01 – 2019-09-05 (×36): 1 mg via ORAL
  Filled 2019-07-31 (×36): qty 1

## 2019-07-31 NOTE — Consult Note (Addendum)
NEURO HOSPITALIST CONSULT NOTE   Requestig physician: Dr. Lake Bells  Reason for Consult: AMS and abnormal LP results  History obtained from:   Chart     HPI:                                                                                                                                          [name] is an 54 y.o. male with a history of stroke, depression and schizophrenia who presented on 4/27 for evaluation of AMS, which was initially thought to be due to EtOH withdrawal versus polysubstance abuse. He was brought in lethargic by his girlfriend after a drug binge the night before. Upon awakening in the emergency department he reported having had a party at his house the night prior and endorsed having taken PCP, heroin and cocaine as well as drinking alcohol.  At that time, he stated that the last thing he remembered was holding a beer.  During his stay, he developed fevers and, due to concern for acute meningitis, he was started on empiric acyclovir, ceftriaxon, ampicillin and vancomycin. Due to delay, LP could not be obtained until 5/2. CSF was abnormal, with markedly elevated protein > 600, elevated WBC of 16 and abnormally low CSF to serum glucose ratio of 0.4. He is currently on day 3 of full meningitis antibiotic regimen.   His AMS has improved, but he is still not back to baseline. Neurology was consulted for interpretation of abnormal CSF results and assessment of the patient's AMS. MRI brain is pending.   Only brain imaging obtained so far is a CT head, which shows mild cerebral atrophy and microvascular disease. No acute abnormality appreciated.   Past Medical History:  Diagnosis Date  . Anxiety   . Arthritis   . Asthma   . Depression   . Duodenal adenoma   . GERD (gastroesophageal reflux disease)   . Hypertension   . Pneumonia    hx  . Post-traumatic osteoarthritis of left ankle   . Pre-diabetes   . Presence of right artificial knee joint  12/17/2016  . Schizophrenia ([place])   . Stroke [facility])    2009-or10    Past Surgical History:  Procedure Laterality Date  . ANKLE ARTHROSCOPY Left 10/20/2017   Procedure: ANKLE ARTHROSCOPY;  Surgeon: Newt Minion, MD;  Location: [place];  Service: Orthopedics;  Laterality: Left;  . ANKLE FUSION Left 10/20/2017  . ANKLE FUSION Left 10/20/2017   Procedure: LEFT ANKLE FUSION;  Surgeon: Newt Minion, MD;  Location: [place];  Service: Orthopedics;  Laterality: Left;  . ESOPHAGOGASTRODUODENOSCOPY Left 12/05/2013   Procedure: ESOPHAGOGASTRODUODENOSCOPY (EGD);  Surgeon: Arta Silence, MD;  Location: [facility];  Service: Endoscopy;  Laterality: Left;  . ESOPHAGOGASTRODUODENOSCOPY (EGD) WITH PROPOFOL N/A 02/20/2015  Procedure: ESOPHAGOGASTRODUODENOSCOPY (EGD) WITH PROPOFOL;  Surgeon: Wilford Corner, MD;  Location: [facility];  Service: Endoscopy;  Laterality: N/A;  . HARDWARE REMOVAL Left 01/29/2018   Procedure: HARDWARE REMOVAL LEFT ANKLE;  Surgeon: Newt Minion, MD;  Location: [place];  Service: Orthopedics;  Laterality: Left;  . JOINT REPLACEMENT    . TOTAL KNEE ARTHROPLASTY Right 07/21/2016   Procedure: TOTAL KNEE ARTHROPLASTY;  Surgeon: Meredith Pel, MD;  Location: [place];  Service: Orthopedics;  Laterality: Right;  . UPPER GASTROINTESTINAL ENDOSCOPY      Family History  Adopted: Yes  Problem Relation Age of Onset  . Colon cancer Neg Hx   . Esophageal cancer Neg Hx   . Rectal cancer Neg Hx   . Stomach cancer Neg Hx              Social History:  reports that he has been smoking cigarettes. He has a 57.00 pack-year smoking history. He has never used smokeless tobacco. He reports previous alcohol use of about 3.0 standard drinks of alcohol per week. He reports that he does not use drugs.  No Known Allergies  MEDICATIONS:                                                                                                                     Prior to Admission:  Medications Prior to  Admission  Medication Sig Dispense Refill Last Dose  . albuterol (PROVENTIL HFA;VENTOLIN HFA) 108 (90 Base) MCG/ACT inhaler Inhale 1 puff into the lungs 2 (two) times daily as needed for wheezing or shortness of breath. (Patient taking differently: Inhale 2 puffs into the lungs 2 (two) times daily as needed for wheezing or shortness of breath. ) 1 Inhaler 1 Past Month at Unknown time  . amLODipine (NORVASC) 2.5 MG tablet TAKE 1 TABLET BY MOUTH ONCE DAILY. (Patient taking differently: Take 2.5 mg by mouth daily. ) 35 tablet 5 07/24/2019  . benztropine (COGENTIN) 1 MG tablet Take 1 mg by mouth daily.   07/24/2019  . buPROPion (WELLBUTRIN XL) 300 MG 24 hr tablet Take 300 mg by mouth daily.   07/24/2019  . cetirizine (ZYRTEC) 10 MG tablet TAKE 1 TABLET BY MOUTH ONCE DAILY. (Patient taking differently: Take 10 mg by mouth daily. No Therapeutic Substitution) 28 tablet 11 07/24/2019  . ibuprofen (ADVIL) 800 MG tablet Take 800 mg by mouth every 6 (six) hours as needed for moderate pain.    Past Month at Unknown time  . INVEGA TRINZA 819 MG/2.625ML SUSY Inject 819 mg into the skin every 3 (three) months.  3 unk at unk  . Melatonin 5 MG TABS Take 5 mg by mouth at bedtime.   07/23/2019  . mirtazapine (REMERON) 15 MG tablet Take 15 mg by mouth at bedtime.   07/23/2019  . Multiple Vitamins-Minerals (VITRUM SENIOR) TABS Take 1 tablet by mouth daily.    07/24/2019  . omeprazole (PRILOSEC) 40 MG capsule TAKE 1 CAPSULE(40 MG) BY MOUTH DAILY (Patient taking differently: Take 40  mg by mouth daily. ) 30 capsule 3 07/24/2019  . ondansetron (ZOFRAN ODT) 4 MG disintegrating tablet Take 1 tablet (4 mg total) by mouth every 8 (eight) hours as needed for nausea. 10 tablet 0 Past Month at Unknown time  . sucralfate (CARAFATE) 1 g tablet TAKE 1 TABLET FOUR TIMES DAILY FOR ACID REFLUX. (Patient taking differently: Take 1 g by mouth 4 (four) times daily. ) 112 tablet 0 07/24/2019  . SUMAtriptan (IMITREX) 50 MG tablet Take 50 mg by mouth  once. May repeat in 2 hours if needed.   Past Month at Unknown time  . docusate sodium (COLACE) 100 MG capsule Take 1 capsule (100 mg total) by mouth 2 (two) times daily. (Patient not taking: Reported on 07/26/2019) 40 capsule 0 Not Taking at Unknown time  . gabapentin (NEURONTIN) 300 MG capsule TAKE 1 CAPSULE BY MOUTH 2 TIMES DAILY (Patient taking differently: Take 300 mg by mouth 2 (two) times daily. ) 60 capsule 2 unk at unk  . losartan (COZAAR) 100 MG tablet Take 100 mg by mouth daily.   unk at unk  . meloxicam (MOBIC) 15 MG tablet Take 15 mg by mouth daily.      Scheduled: . amLODipine  5 mg Oral Daily  . benztropine  1 mg Oral Daily  . buPROPion  300 mg Oral Daily  . Chlorhexidine Gluconate Cloth  6 each Topical Daily  . enoxaparin (LOVENOX) injection  40 mg Subcutaneous Daily  . [START ON 2/0/8138] folic acid  1 mg Oral Daily  . gabapentin  300 mg Oral BID  . insulin aspart  0-5 Units Subcutaneous QHS  . insulin aspart  0-9 Units Subcutaneous TID WC  . losartan  50 mg Oral Daily  . pantoprazole  40 mg Oral Daily  . thiamine  100 mg Oral Daily   Continuous: . sodium chloride Stopped (07/28/19 1838)  . acyclovir Stopped (07/31/19 0836)  . lactated ringers Stopped (07/31/19 1026)  . potassium chloride 10 mEq (07/31/19 1501)     ROS:                                                                                                                                       As per HPI. In the context of his AMS and poor insight, the patient has no additional complaints.   Blood pressure 113/78, pulse (!) 131, temperature 97.8 F (36.6 C), temperature source Oral, resp. rate (!) 23, height '5\' 10"'$  (1.778 m), weight 94.3 kg, SpO2 100 %.   General Examination:  Physical Exam  HEENT-  [place]/AT    Lungs- Respirations unlabored Extremities- No edema  Neurological Examination Mental Status:  Awake and alert. Oriented to city, state and the month, but not the year or the day of the week. Speech is fluent with intact naming and comprehension for basic commands and questions, but the patient becomes tangential at times and also has difficulty answering more complex questions. He gives evidence of having poor memory for recent personal events as well as poor insight. Was not able to correctly repeat a phrase. Able to recite months of the year forwards, but becomes confused midway when asked to recite them backwards. Some difficulty with simple addition. On first trial, registered 2/3 memory items. After they were correctly registered, he had normal delayed recall answering correctly 3/3. Calm and cooperative. Does not appear to have an altered sensorium.  Cranial Nerves: II:  Visual fields intact. PERRL.   III,IV, VI: EOMI. No nystagmus. No ptosis.  V,VII: Smile symmetric, facial temp sensation equal bilaterally VIII: hearing intact to voice IX,X: No hypophonia or hoarseness XI: Symmetric XII: Midline tongue extension Motor: Right : Upper extremity   5/5    Left:     Upper extremity   5/5  Lower extremity   5/5     Lower extremity   5/5 No pronator drift.  Sensory: Temp and light touch intact in all 4 extremities Deep Tendon Reflexes: 2+ bilateral brachioradialis and biceps. 1+ left patella, 0 right patella.  Plantars: Right: downgoing   Left: downgoing Cerebellar: No ataxia with FNF bilaterally  Gait: Deferred   Lab Results: Basic Metabolic Panel: Recent Labs  Lab 07/26/19 0819 07/26/19 0819 07/27/19 0550 07/27/19 0550 07/28/19 0549 07/29/19 0240 07/31/19 0321  NA 131*  --  137  --  134* 134* 136  K 3.1*  --  3.2*  --  3.3* 3.5 2.8*  CL 98  --  101  --  100 101 102  CO2 25  --  26  --  23 20* 22  GLUCOSE 93  --  96  --  99 93 112*  BUN 6  --  6  --  '9 9 8  '$ CREATININE 0.80  --  0.63  --  0.65 0.64 0.67  CALCIUM 8.2*   < > 8.5*   < > 8.5* 8.3* 8.5*  MG 1.7  --  1.7  --   1.8  --   --   PHOS 3.3  --   --   --  3.5  --   --    < > = values in this interval not displayed.    CBC: Recent Labs  Lab 07/25/19 1501 07/25/19 2345 07/26/19 0819 07/27/19 0550 07/28/19 0549 07/29/19 0240 07/31/19 0321  WBC 12.9*  --  9.2 10.9* 12.7* 12.7* 9.4  NEUTROABS 11.5*  --  6.8 8.8*  --   --  7.1  HGB 10.1*  --  9.1* 9.2* 9.4* 10.2* 10.2*  HCT 33.1*   < > 30.2* 29.8* 30.3* 33.5* 34.3*  MCV 78.6*  --  78.6* 77.6* 77.5* 78.1* 79.4*  PLT 582*  --  515* 466* 447* 520* 511*   < > = values in this interval not displayed.    Cardiac Enzymes: Recent Labs  Lab 07/25/19 1501  CKTOTAL 475*    Lipid Panel: No results for input(s): CHOL, TRIG, HDL, CHOLHDL, VLDL, LDLCALC in the last 168 hours.  Imaging: DG Chest Port 1 View  Result Date: 07/31/2019 CLINICAL  DATA:  Respiratory failure EXAM: PORTABLE CHEST 1 VIEW COMPARISON:  07/28/2019 FINDINGS: There is increasing interstitial and more patchy infrahilar opacities with indistinct vascularity. Cardiac prominence is similar to comparison exams. No pneumothorax or visible effusion. No acute osseous or soft tissue abnormality. Telemetry leads overlie the chest. IMPRESSION: Increasing interstitial and more patchy infrahilar opacities with indistinct vascularity. Findings could suggest some developing edema and/or atelectasis. Electronically Signed   By: Lovena Le M.D.   On: 07/31/2019 07:04   DG FLUORO GUIDE LUMBAR PUNCTURE  Result Date: 07/30/2019 CLINICAL DATA:  Acute encephalopathy EXAM: DIAGNOSTIC LUMBAR PUNCTURE UNDER FLUOROSCOPIC GUIDANCE FLUOROSCOPY TIME:  Fluoroscopy Time:  0 minutes, 30 seconds Radiation Exposure Index (if provided by the fluoroscopic device): 5.9 mGy Number of Acquired Spot Images: 0 PROCEDURE: I discussed the risks (including hemorrhage, infection, and nerve damage, among others), benefits, and alternatives to fluoroscopically guided lumbar puncture with patient's mother by telephone, as the patient is  unable to consent and has altered mental status. We specifically discussed the high likelihood of technical success of the procedure. The patient's mother understood and elected for the patient undergo the procedure. Standard time-out was employed. Following sterile skin prep and local anesthetic administration consisting of 1 percent lidocaine, a 20 gauge spinal needle was advanced without difficulty into the thecal sac at the L4-5 level under fluoroscopic guidance. Initially blood tinged but subsequent clearing CSF was returned. CSF pressure was subjectively low to normal, a quantitative measurement was not obtained. A total of 10 cc of CSF was collected in 4 vials. The needle was subsequently removed and the skin cleansed and bandaged. No immediate complications were observed. IMPRESSION: 1. Technically successful fluoroscopically guided lumbar puncture at the L4-5 level, without immediate complication. Electronically Signed   By: Van Clines M.D.   On: 07/30/2019 13:15    Assessment: 54 year old male with altered mental status and abnormal LP 1. CSF with markedly elevated protein > 600, elevated WBC of 16 and abnormally low CSF to serum glucose ratio of 0.4. CSF HSV and VDRL are pending. CSF fungal culture is pending. CSF gram stain negative. CSF bacterial culture is negative so far (< 24 hours).  2. He is currently on day 3 of full meningitis antibiotic regimen, but ceftriaxone, vancomycin and ampicillin have been discontinued by Pharmacy.  3. Interpretation of CSF results:   -- Markedly elevated protein and mildly elevated WBC are compatible with a partially treated bacterial meningitis - this is felt to be the most likely explanation for his overall presentation and CSF results.   -- His CSF profile is also compatible with a rapidly progressive neurodegenerative disorder or a CNS inflammatory process, but these are significantly lower on the DDx given his improvement with ABX. 5.  -- Prolonged  subclinical seizure activity with cortical damage could also result in this CSF profile.  4. Vitamin B12 level of 147 is markedly low by Neurological standards. This may also provide an explanation for the patient's cognitive decline.  5. TSH normal. RPR non-reactive. Ammonia normal. AST and ALT normal.   Recommendations: 1. Restart ampicillin, ceftriaxone and vancomycin. Would complete full course of 14 days. Given the delay in obtaining LP, at this time point a negative gram stain or bacterial culture cannot be used to rule out a bacterial meningitis.  2. MRI brain with and without contrast.  3. EEG (ordered) 4. Vitamin B12 supplementation. Would start with SQ B12 1000 microgram qd x 1 week (ordered), then transition to 2000 mcg po  qd thereafter.  5. If he does not improve with antibiotics and B12 supplementation, and if MRI is unrevealing, then CNS vasculitis or autoimmune encephalitis would be high enough on the DDx to warrant additional work up and possible empiric treatment.  6. Vitamin B1 level (ordered) 7. ESR and c-reactive protein (ordered) 8. Follow up B12 level in 1 month.   Electronically signed: Dr. Kerney Elbe 07/31/2019, 3:57 PM

## 2019-07-31 NOTE — Progress Notes (Addendum)
Pharmacy Antibiotic Note  [name] is a 54 y.o. male admitted on 07/25/2019 with altered mental status.  Pharmacy has been consulted for acyclovir, vancomycin, ampicillin & ceftriaxone dosing for meningitis treatment.  07/31/2019  D#6 total abx, D#3 full meningitis abx AF, WBC down to WNL, SCr WNL 5/2 LP completed by IR    Plan: Acyclovir 900 mg IV q8h (~ 10 mg/kg/dose) Dc ceftriaxone, ampicillin, vancomycin  Height: 5\' 10"  (177.8 cm) Weight: 94.3 kg (207 lb 14.3 oz) IBW/kg (Calculated) : 73  Temp (24hrs), Avg:98 F (36.7 C), Min:97.6 F (36.4 C), Max:98.7 F (37.1 C)  Recent Labs  Lab 07/25/19 1501 07/25/19 2346 07/26/19 0819 07/27/19 0550 07/28/19 0549 07/29/19 0240 07/31/19 0321  WBC   < >  --  9.2 10.9* 12.7* 12.7* 9.4  CREATININE   < >  --  0.80 0.63 0.65 0.64 0.67  LATICACIDVEN  --  1.1  --   --   --   --   --    < > = values in this interval not displayed.    Estimated Creatinine Clearance: 123.1 mL/min (by C-G formula based on SCr of 0.67 mg/dL).    No Known Allergies  Antimicrobials this admission: Ceftriaxone x1 4/28 >> 4/30 Doxycycline 4/28 > 4/29 Ampicillin/sulbactam 4/29 >> 4/30 4/30 ceftriaxone>> 5/3 4/30 ampicillin>> 5/3 4/30 acyclovir>> 4/30 vancomycin>>5/3  Dose adjustments this admission:   Microbiology results: 4/27 Influenza A/B: neg, Covid: neg 4/27 RPR: nonreactive 4/27 HIV: nonreactive 4/27 BCx: ngF 4/27 UCx: ngF  4/29 MRSA PCR: neg 5/2 CSF: gram stain neg, ng<24 hrs 5/2 CSF fungal: ngtd 5/2 CSF HSV: ip 5/2 CSF VDRL: ip  Thank you for allowing pharmacy to be a part of this patient's care.  Eudelia Bunch, Pharm.D 07/31/2019 11:44 AM

## 2019-07-31 NOTE — Progress Notes (Signed)
Pharmacy Antibiotic Note  [name] is a 54 y.o. male admitted on 07/25/2019 with altered mental status. Patient placed on Acyclovir, Vancomycin, Ampicillin, and Ceftriaxone for meningitis. Antibiotics discontinued earlier today. Being resumed this evening at recommendation of Neurology.   Plan:  Continue Acyclovir 900mg  IV q8h (~ 10 mg/kg/dose)  Ceftriaxone adjusted to 2g IV q12h   Ampicillin adjusted to 2g IV q4h  Resume Vancomycin 1500mg  IV q12h for now  Check Vancomycin trough level at 2130 tonight (prior to next dose). Goal trough level 15-20 mcg/mL.   Height: 5\' 10"  (177.8 cm) Weight: 94.3 kg (207 lb 14.3 oz) IBW/kg (Calculated) : 73  Temp (24hrs), Avg:98.1 F (36.7 C), Min:97.6 F (36.4 C), Max:98.7 F (37.1 C)  Recent Labs  Lab 07/25/19 1501 07/25/19 2346 07/26/19 0819 07/27/19 0550 07/28/19 0549 07/29/19 0240 07/31/19 0321  WBC   < >  --  9.2 10.9* 12.7* 12.7* 9.4  CREATININE   < >  --  0.80 0.63 0.65 0.64 0.67  LATICACIDVEN  --  1.1  --   --   --   --   --    < > = values in this interval not displayed.    Estimated Creatinine Clearance: 123.1 mL/min (by C-G formula based on SCr of 0.67 mg/dL).    No Known Allergies  Antimicrobials this admission: 4/28 Ceftriaxone x 1, 4/30 >> 4/28 Doxycycline >> 4/28 4/29 Ampicillin/sulbactam >> 4/30 4/30 Ampicillin>> 5/3 AM, resumed 5/3 PM >> 4/30 Acyclovir >> 4/30 Vancomycin >>    Microbiology results: 4/27 Influenza A/B: neg, COVID: neg 4/27 RPR: non reactive 4/27 HIV: non reactive 4/27 BCx: NGF 4/27 UCx: NGF  4/29 MRSA PCR: negative 5/2 CSF: NGTD 5/2 CSF fungus culture: NGTD 5/2 CSF HSV: ip 5/2 CSF VDRL: ip  Thank you for allowing pharmacy to be a part of this patient's care.   Lindell Spar, PharmD, BCPS Clinical Pharmacist  07/31/2019 6:13 PM

## 2019-07-31 NOTE — Progress Notes (Signed)
Restarting abx per neuro opinion  [name] V. Elsworth Soho MD

## 2019-07-31 NOTE — Progress Notes (Signed)
Assumption of care note:  Received call from [place] regarding ICU downgrade of [name].  Patient is a 54 year old initially admitted on 07/25/2019 for altered mental status thought to be secondary to alcohol withdrawal versus polysubstance abuse.  He developed fevers and there was concern for acute meningitis and was started on empiric antibiotics with vancomycin, Rocephin, ampicillin, acyclovir.  Delay in obtaining LP until 07/30/2019 with elevated total protein greater than 600 with glucose normal.  Neurology consulted.  Pending MR.  Now weaned off of Precedex drip.  Follow-up VDRL and HSV PCR.  Mental status now improved.  TRH to assume care of this patient on 08/01/2019.

## 2019-07-31 NOTE — Progress Notes (Signed)
NAME:  EZERA TAMBASCO, MRN:  [id number], DOB:  March 27, 1966, LOS: 5 ADMISSION DATE:  07/25/2019, CONSULTATION DATE:  07/27/19 REFERRING MD:  Dr. Pola Corn, CHIEF COMPLAINT:  Alcohol withdrawal    Brief History   54 y/o M admitted 4/27 with polysubstance abuse and suspected ETOH withdrawal.    Past Medical History  ETOH Abuse CVA  HTN GERD Depression / Anxiety  Asthma  Duodenal Adenoma  Pre-Diabetes Right Knee Replacement  Chronic Back Pain   Significant Hospital Events   4/27 Presented to [facility] 4/28 Admit 4/29:Critical care consulted for presumed alcohol withdrawal and need for Precedex 4/30: Started on meningitis coverage given more history of  off-and-on fever, chills, blisters on his mouth 5/2: LP completed Consults:  PCCM   Procedures:  Lumbar puncture 4/30-unsuccessful  Significant Diagnostic Tests:   ECHO 4/28 >> LVEF 50 to 55%, low normal.  LV internal cavity mildly dilated with left ventricular hypertrophy normal RV function  CT Head 4/28 >> no acute intracranial abnormality, mild cerebral atrophy, chronic nasal bone fractures  -LP completed 5/2. nml glucose,But BMW:[id number] ratio <0.4,  wbc elevated, protein inc >600  Micro Data:  COVID 4/27 >> negative  Influenza A/B 4/27 >> negative  UC 4/27 >> negative  BCx2 4/27 >>   Antimicrobials:  Unasyn (empiric aspiration) 4/29  Acyclovir 4/30>>  ampicillin 4/30>> Ceftriaxone 4/30>> Vancomycin 4/30>> Doxycycline 4/27 Interim history/subjective:    Objective   Blood pressure 133/88, pulse 87, temperature 98.7 F (37.1 C), temperature source Axillary, resp. rate 19, height 5\' 10"  (1.778 m), weight 94.3 kg, SpO2 96 %.        Intake/Output Summary (Last 24 hours) at 07/31/2019 1027 Last data filed at 07/31/2019 0600 Gross per 24 hour  Intake 3077.03 ml  Output 3000 ml  Net 77.03 ml   Filed Weights   07/26/19 1858  Weight: 94.3 kg    Examination: General 54 year old male resting comfortably in bed he is in no  acute distress this morning HEENT normocephalic atraumatic no jugular venous distention appreciated Pulmonary: Clear to auscultation no accessory use currently room air Cardiac: Regular rate and rhythm without murmur rub or gallop Abdomen: Soft nontender no organomegaly positive bowel sounds Extremities warm dry brisk capillary refill no edema Neuro awake, follows commands, oriented x1-2 GU clear yellow  Resolved Hospital Problem list   AKI ->resolved  Sinus tachycardia  Mild pulmonary edema   Assessment & Plan:   Acute toxic and metabolic encephalopathy. Multifactorial: meningitis (r/o viral vs bacterial) Polysubstance Abuse, w/ h/o  Chronic anxiety pain and depression and schizophrenia  UDS positive for amphetamines, benzo's, opiates.  ETOH level <10 on admit. Girlfriend reports he uses Adderall, she indicates he does not drink but Care Everywhere review shows multiple calls / healthcare interactions regarding ETOH abuse. Also history on admission suggests otherwise  -HIV, RPR negative -LP completed 5/2. nml glucose,But OZD:GUYQIHK ratio <0.4,  wbc elevated, protein inc >600   Plan follow-up CSF culture Follow-up VDRL and HSV PCR Continue ceftriaxone, ampicillin, vancomycin, and acyclovir, day #4 Cont cogentin Cont thiamine and folate.  Add back wellbutrin and neuronitn  Try to wean off precedex today   Hypertension   Plan Cont cozaar and norvasc  Fluid and electrolyte imbalance: Hypokalemia Plan Replace and recheck  Microcytic Anemia  Vitamin B12 Deficiency  Plan Cont MVI and B12 supplementation Transfuse for hgb < 7  GERD  Plan PPI  Best practice:  Diet: per primary  Pain/Anxiety/Delirium protocol (if indicated): precedex VAP protocol (if indicated):  n/a DVT prophylaxis: lovenox  GI prophylaxis: n/a  Glucose control: n/a Mobility: as tolerate Code Status: Full Code  Family Communication: Will update family on arrival 4/30 Disposition: Continue ICU care  well on Precedex My cct 32 min Simonne Martinet ACNP-BC [facility] Pulmonary/Critical Care Pager # [phone] OR # [phone] if no answer

## 2019-07-31 NOTE — Progress Notes (Signed)
Dr. Holley Raring, pt's psychiatrist called to check on pt. Dr. Holley Raring offered to provide any information needed about pt's psychiatric history and medications he is currently taking. Laurey Arrow, NP notified and aware. Dr. Elwyn Lade office number is [phone].

## 2019-07-31 NOTE — Progress Notes (Signed)
eLink Physician-Brief Progress Note Patient Name: [name] DOB: [birth year] MRN: [id number]   Date of Service  07/31/2019  HPI/Events of Note  Notified of K 2.8, creatinine 0.67  eICU Interventions  Ordered K total 60 meqs IV     Intervention Category Major Interventions: Electrolyte abnormality - evaluation and management  Judd Lien 07/31/2019, 7:03 AM

## 2019-08-01 ENCOUNTER — Inpatient Hospital Stay (HOSPITAL_COMMUNITY): Payer: Medicare Other

## 2019-08-01 ENCOUNTER — Inpatient Hospital Stay (HOSPITAL_COMMUNITY)
Admit: 2019-08-01 | Discharge: 2019-08-01 | Disposition: A | Discharge status: Home / Self Care | Payer: Medicare Other | Attending: Neurology | Admitting: Neurology

## 2019-08-01 ENCOUNTER — Telehealth: Payer: Self-pay | Admitting: Specialist

## 2019-08-01 LAB — CBC
HCT: 31.1 % — ABNORMAL LOW (ref 39.0–52.0)
Hemoglobin: 9.3 g/dL — ABNORMAL LOW (ref 13.0–17.0)
MCH: 23.6 pg — ABNORMAL LOW (ref 26.0–34.0)
MCHC: 29.9 g/dL — ABNORMAL LOW (ref 30.0–36.0)
MCV: 78.9 fL — ABNORMAL LOW (ref 80.0–100.0)
Platelets: 487 10*3/uL — ABNORMAL HIGH (ref 150–400)
RBC: 3.94 MIL/uL — ABNORMAL LOW (ref 4.22–5.81)
RDW: 16.5 % — ABNORMAL HIGH (ref 11.5–15.5)
WBC: 10.4 10*3/uL (ref 4.0–10.5)
nRBC: 0 % (ref 0.0–0.2)

## 2019-08-01 LAB — GLUCOSE, CAPILLARY
Glucose-Capillary: 109 mg/dL — ABNORMAL HIGH (ref 70–99)
Glucose-Capillary: 118 mg/dL — ABNORMAL HIGH (ref 70–99)
Glucose-Capillary: 90 mg/dL (ref 70–99)
Glucose-Capillary: 114 mg/dL — ABNORMAL HIGH (ref 70–99)

## 2019-08-01 LAB — COMPREHENSIVE METABOLIC PANEL
ALT: 13 U/L (ref 0–44)
AST: 22 U/L (ref 15–41)
Albumin: 2.9 g/dL — ABNORMAL LOW (ref 3.5–5.0)
Alkaline Phosphatase: 158 U/L — ABNORMAL HIGH (ref 38–126)
Anion gap: 9 (ref 5–15)
BUN: 9 mg/dL (ref 6–20)
CO2: 20 mmol/L — ABNORMAL LOW (ref 22–32)
Calcium: 8.2 mg/dL — ABNORMAL LOW (ref 8.9–10.3)
Chloride: 104 mmol/L (ref 98–111)
Creatinine, Ser: 0.83 mg/dL (ref 0.61–1.24)
GFR calc Af Amer: >60 mL/min (ref >60)
GFR calc non Af Amer: >60 mL/min (ref >60)
Glucose, Bld: 131 mg/dL — ABNORMAL HIGH (ref 70–99)
Potassium: 3.1 mmol/L — ABNORMAL LOW (ref 3.5–5.1)
Sodium: 133 mmol/L — ABNORMAL LOW (ref 135–145)
Total Bilirubin: 0.4 mg/dL (ref 0.3–1.2)
Total Protein: 6.6 g/dL (ref 6.5–8.1)

## 2019-08-01 LAB — VDRL, CSF: VDRL Quant, CSF: NONREACTIVE

## 2019-08-01 MED ORDER — LABETALOL HCL 5 MG/ML IV SOLN: 10.0000 mg | INTRAVENOUS | Status: DC | PRN

## 2019-08-01 MED ORDER — IBUPROFEN 800 MG PO TABS
800.0000 mg | ORAL_TABLET | Freq: Once | ORAL | Status: DC
  Filled 2019-08-01: qty 1

## 2019-08-01 MED ORDER — POTASSIUM CHLORIDE CRYS ER 20 MEQ PO TBCR
30.0000 meq | EXTENDED_RELEASE_TABLET | ORAL | Status: AC
  Administered 2019-08-01 (×2): 30 meq via ORAL
  Filled 2019-08-01 (×2): qty 1

## 2019-08-01 MED ORDER — HYDROMORPHONE HCL 1 MG/ML IJ SOLN
0.5000 mg | INTRAMUSCULAR | Status: DC | PRN
  Administered 2019-08-01 – 2019-08-07 (×23): 0.5 mg via INTRAVENOUS
  Filled 2019-08-01 (×23): qty 0.5

## 2019-08-01 MED ORDER — GADOBUTROL 1 MMOL/ML IV SOLN
10.0000 mL | Freq: Once | INTRAVENOUS | Status: AC | PRN
  Administered 2019-08-01: 10 mL via INTRAVENOUS

## 2019-08-01 MED ORDER — OXYCODONE-ACETAMINOPHEN 5-325 MG PO TABS
1.0000 | ORAL_TABLET | ORAL | Status: DC | PRN
  Administered 2019-08-01 – 2019-08-04 (×14): 1 via ORAL
  Filled 2019-08-01 (×13): qty 1

## 2019-08-01 MED ORDER — VITAMIN B-12 1000 MCG PO TABS
2000.0000 ug | ORAL_TABLET | Freq: Every day | ORAL | Status: DC
  Administered 2019-08-07 – 2019-09-05 (×30): 2000 ug via ORAL
  Filled 2019-08-01 (×30): qty 2

## 2019-08-01 MED ORDER — METOPROLOL TARTRATE 5 MG/5ML IV SOLN
5.0000 mg | INTRAVENOUS | Status: DC | PRN
  Administered 2019-08-01: 5 mg via INTRAVENOUS
  Filled 2019-08-01: qty 5

## 2019-08-01 NOTE — Progress Notes (Signed)
EEG complete - results pending 

## 2019-08-01 NOTE — Progress Notes (Signed)
PT Cancellation Note  Patient Details Name: [name] MRN: [id number] DOB: Mar 11, 1966    Cancelled Treatment:    Reason Eval/Treat Not Completed: Patient at procedure or test/unavailable, to xray. Tresa Endo PT Acute Rehabilitation Services Pager [phone] Office [phone]    [name] 08/01/2019, 3:20 PM

## 2019-08-01 NOTE — Telephone Encounter (Signed)
I lmom advising carol that Dr. Louanne Skye would go over to see him as soon as he was able to.

## 2019-08-01 NOTE — Telephone Encounter (Signed)
Patient's friend [name]) called advised patient is in the hospital. [name] asked if Dr Louanne Skye will go by the hospital to see patient. Patient was admitted in the hospital [facility])  07/24/2019.  Patient is in ICU. [name] said patient has an infection in his back.  The number to contact [name] is [phone]

## 2019-08-01 NOTE — Progress Notes (Signed)
Pharmacy Antibiotic Note  [name] is a 54 y.o. male admitted on 07/25/2019 with meningitis.  Pharmacy has been consulted for vancomycin dosing.  Vancomycin trough = 16 and prior doses charted appropriately.    Plan: Continue with Vancomycin 1500mg  iv q12hr   Height: 5\' 10"  (177.8 cm) Weight: 94.3 kg (207 lb 14.3 oz) IBW/kg (Calculated) : 73  Temp (24hrs), Avg:98.5 F (36.9 C), Min:97.8 F (36.6 C), Max:99.3 F (37.4 C)  Recent Labs  Lab 07/25/19 2346 07/26/19 0819 07/27/19 0550 07/28/19 0549 07/29/19 0240 07/31/19 0321 07/31/19 2129 08/01/19 0212  WBC  --    < > 10.9* 12.7* 12.7* 9.4  --  10.4  CREATININE  --    < > 0.63 0.65 0.64 0.67  --  0.83  LATICACIDVEN 1.1  --   --   --   --   --   --   --   VANCOTROUGH  --   --   --   --   --   --  16  --    < > = values in this interval not displayed.    Estimated Creatinine Clearance: 118.6 mL/min (by C-G formula based on SCr of 0.83 mg/dL).    No Known Allergies   Antimicrobials this admission: 4/28 Ceftriaxone x 1, 4/30 >> 4/28 Doxycycline >> 4/28 4/29 Ampicillin/sulbactam >>4/30 4/30 Ampicillin>> 5/3 AM, resumed 5/3 PM >> 4/30 Acyclovir >> 4/30 Vancomycin >>  Microbiology results: 4/27 Influenza A/B: neg, COVID: neg 4/27 RPR: non reactive 4/27 HIV: non reactive 4/27BCx: NGF 4/27UCx: NGF 4/29 MRSA PCR: negative 5/2 CSF: NGTD 5/2 CSF fungus culture: NGTD 5/2 CSF HSV: ip 5/2 CSF VDRL: ip Thank you for allowing pharmacy to be a part of this patient's care.  Nani Skillern Crowford 08/01/2019 3:55 AM

## 2019-08-01 NOTE — Progress Notes (Signed)
PROGRESS NOTE    [name]  OAC:[id number] DOB: 1965-09-26 DOA: 07/25/2019 PCP: [name], MD    Brief Narrative:  [name] is a 54 year old gentleman with past medical history remarkable for HTN, CVA, GERD, depression/anxiety, chronic pain syndrome, EtOH dependence, history of polysubstance abuse including alcohol, PCP, heroin, cocaine who was brought in by EMS after being found on the sidewalk with increased heart rate and respiratory rate.  Apparently, patient had a party at his house the night of 4/26 in which she was using multiple substances to include alcohol, PCP, heroin and cocaine.  The last thing he recalls was holding a beer.  In the ED, patient was altered, arousable to voice mumbling with close eyes.  Temperature 98.2, heart rate 146, SBP 140s.  Creatinine 1.5, WBC count 12.9, hemoglobin 10.1, platelets 582.  UDS notable for amphetamine, benzodiazepine and opiate.  CT head with no acute intracranial abnormality.  Patient was hydrated with IV fluids and TRH was consulted for hospital admission.  On 07/27/2019, patient with worsening agitation and pulmonary critical care medicine was consulted and was transferred to their service on a Precedex drip.  Patient was weaned off of Precedex drip on 07/31/2019 and transferred back to [facility] on 08/01/2019.   Assessment & Plan:   Active Problems:   Nicotine dependence, cigarettes, uncomplicated   Toxic metabolic encephalopathy   Polysubstance overdose   Acute kidney injury (HCC)   Chronic pain syndrome   SIRS (systemic inflammatory response syndrome) (HCC)   Delirium   Altered mental status   Acute metabolic encephalopathy: Resolved Patient presenting to the ED via EMS after being found confused mumbling on the sidewalk.  Etiology likely multifactorial with apparent recent cardiac Home with polysubstance abuse with cocaine, heroin, PCP and alcohol.  Also consideration of meningitis.  HIV/RPR nonreactive..  Ammonia level  within normal limits.  Mentation now appears to be back at his normal baseline. --Vitamin B-1 level: Pending --Vitamin B12 level 147, low.  Continue B12 1000 mcg subcu x1 week followed by 2000 mcg p.o. daily; recommend repeat B12 level 1 month  Meningitis Patient presented with AMS as above likely multifactorial picture.  He continued to have fevers, chills and there was concern for possible meningitis.  He was started on empiric antibiotics on 07/28/2019 with ampicillin, acyclovir, ceftriaxone, vancomycin.  LP was delayed until 07/30/2019 with notable findings of CSF protein greater than 600, glucose normal at 46, cell count with WBC 16, lymphocytic predominance, 23 RBCs.  CSF Gram stain negative.  Elevated ESR and CRP 28 and 10.0 respectively. --Neurology following, appreciate assistance --HSV: Pending --VLDL: Pending --CSF fungal culture: No growth x1 day --CSF culture: No growth less than 24 hours --MR brain with and without contrast: Pending --EEG: Pending --Neuro recommends 14-day course of ampicillin, ayclovir, vancomycin, ceftriaxone delay in obtaining LP and improvement of mental status while on antibiotic therapy  Polysubstance abuse with EtOH, amphetamine, opiates, benzodiazepine Hx PCP, cocaine, heroin abuse Titrated off of Precedex drip on 07/31/2019.  Counseled on need for complete cessation. --Folic acid 1 mg p.o. daily --Thiamine 100 mg p.o. daily  Essential hypertension BP 146/91. --Continue losartan 50 mg p.o. daily 5 mg p.o. daily  Chronic pain syndrome --Gabapentin 300 mg p.o. twice daily --Percocet 5-325 mg every 4 hours as needed moderate pain  GERD: continue PPI  Depression/anxiety: --Bupropion 300 mg p.o. daily --Lorazepam 1-2 mg IV every 1 hour seizures/agitation  Hypokalemia Potassium 3.1 this morning.  Repleted. --Repeat electrolytes in a.m. to include  magnesium  Weakness: --PT evaluation  DVT prophylaxis: Lovenox Code Status: Full code Family  Communication: Discussed with patient extensively at bedside  Disposition Plan:  Status is: Inpatient  Remains inpatient appropriate because:Persistent severe electrolyte disturbances, Ongoing active pain requiring inpatient pain management, Altered mental status, Ongoing diagnostic testing needed not appropriate for outpatient work up, Unsafe d/c plan, IV treatments appropriate due to intensity of illness or inability to take PO and Inpatient level of care appropriate due to severity of illness   Dispo: The patient is from: Home              Anticipated d/c is to: To be determined              Anticipated d/c date is: 3 days              Patient currently is not medically stable to d/c.  Consultants:   PCCM - signed off 08/01/2019  Neurology - Dr. Georg Ruddle  Procedures:   Lumbar puncture - 5/2  EEG: Pending  Antimicrobials:   Acyclovir 4/30>>  Ampicillin 4/30>>  Vancomycin 5/1>>  Ceftriaxone 4/27-4/28, 4/30>>  Unasyn 4/29 - 4/29  Doxycycline 4/28 - 4/28   Subjective: Patient seen and examined at bedside, resting comfortably.  Complains of some low back pain.  No other specific complaints this morning.  Mental status seems to be at baseline.  Denies headache, no chest pain, no palpitations, no shortness of breath, no abdominal pain.  Pending MR brain for further evaluation of elevated protein level LP.  EEG completed, pending official report.  No acute concerns overnight per nursing staff.  Transfer to telemetry today.  Objective: Vitals:   08/01/19 0620 08/01/19 0630 08/01/19 0645 08/01/19 0800  BP: (!) 146/91 106/80  (!) 143/77  Pulse: 100 97 (!) 104 (!) 104  Resp: 17 15  17   Temp:    98.9 F (37.2 C)  TempSrc:    Oral  SpO2: 97% 100%  97%  Weight:      Height:        Intake/Output Summary (Last 24 hours) at 08/01/2019 1118 Last data filed at 08/01/2019 0600 Gross per 24 hour  Intake 1599.44 ml  Output --  Net 1599.44 ml   Filed Weights   07/26/19 1858   Weight: 94.3 kg    Examination:  General exam: Appears calm and comfortable, appears older than stated age Respiratory system: Clear to auscultation. Respiratory effort normal.  Oxygenating well on room air. Cardiovascular system: S1 & S2 heard, RRR. No JVD, murmurs, rubs, gallops or clicks. No pedal edema. Gastrointestinal system: Abdomen is nondistended, soft and nontender. No organomegaly or masses felt. Normal bowel sounds heard. Central nervous system: Alert and oriented. No focal neurological deficits. Extremities: Symmetric 5 x 5 power. Skin: No rashes, lesions or ulcers Psychiatry: Judgement and insight appear poor. Mood & affect appropriate.     Data Reviewed: I have personally reviewed following labs and imaging studies  CBC: Recent Labs  Lab 07/25/19 1501 07/25/19 2345 07/26/19 0819 07/26/19 0819 07/27/19 0550 07/28/19 0549 07/29/19 0240 07/31/19 0321 08/01/19 0212  WBC 12.9*  --  9.2   < > 10.9* 12.7* 12.7* 9.4 10.4  NEUTROABS 11.5*  --  6.8  --  8.8*  --   --  7.1  --   HGB 10.1*  --  9.1*   < > 9.2* 9.4* 10.2* 10.2* 9.3*  HCT 33.1*   < > 30.2*   < > 29.8* 30.3* 33.5* 34.3* 31.1*  MCV 78.6*  --  78.6*   < > 77.6* 77.5* 78.1* 79.4* 78.9*  PLT 582*  --  515*   < > 466* 447* 520* 511* 487*   < > = values in this interval not displayed.   Basic Metabolic Panel: Recent Labs  Lab 07/26/19 0819 07/26/19 0819 07/27/19 0550 07/28/19 0549 07/29/19 0240 07/31/19 0321 08/01/19 0212  NA 131*   < > 137 134* 134* 136 133*  K 3.1*   < > 3.2* 3.3* 3.5 2.8* 3.1*  CL 98   < > 101 100 101 102 104  CO2 25   < > 26 23 20* 22 20*  GLUCOSE 93   < > 96 99 93 112* 131*  BUN 6   < > 6 9 9 8 9   CREATININE 0.80   < > 0.63 0.65 0.64 0.67 0.83  CALCIUM 8.2*   < > 8.5* 8.5* 8.3* 8.5* 8.2*  MG 1.7  --  1.7 1.8  --   --   --   PHOS 3.3  --   --  3.5  --   --   --    < > = values in this interval not displayed.   GFR: Estimated Creatinine Clearance: 118.6 mL/min (by C-G  formula based on SCr of 0.83 mg/dL). Liver Function Tests: Recent Labs  Lab 07/25/19 1501 07/27/19 0550 08/01/19 0212  AST 28 36 22  ALT 13 11 13   ALKPHOS 169* 136* 158*  BILITOT 0.5 0.6 0.4  PROT 7.3 6.5 6.6  ALBUMIN 3.3* 2.9* 2.9*   No results for input(s): LIPASE, AMYLASE in the last 168 hours. Recent Labs  Lab 07/25/19 2345  AMMONIA 31   Coagulation Profile: No results for input(s): INR, PROTIME in the last 168 hours. Cardiac Enzymes: Recent Labs  Lab 07/25/19 1501  CKTOTAL 475*   BNP (last 3 results) No results for input(s): PROBNP in the last 8760 hours. HbA1C: No results for input(s): HGBA1C in the last 72 hours. CBG: Recent Labs  Lab 07/31/19 0824 07/31/19 1155 07/31/19 1624 07/31/19 2128 08/01/19 0811  GLUCAP 132* 89 67* 101* 109*   Lipid Profile: No results for input(s): CHOL, HDL, LDLCALC, TRIG, CHOLHDL, LDLDIRECT in the last 72 hours. Thyroid Function Tests: No results for input(s): TSH, T4TOTAL, FREET4, T3FREE, THYROIDAB in the last 72 hours. Anemia Panel: No results for input(s): VITAMINB12, FOLATE, FERRITIN, TIBC, IRON, RETICCTPCT in the last 72 hours. Sepsis Labs: Recent Labs  Lab 07/25/19 2346  LATICACIDVEN 1.1    Recent Results (from the past 240 hour(s))  Urine culture     Status: None   Collection Time: 07/25/19  6:05 PM   Specimen: Urine, Catheterized  Result Value Ref Range Status   Specimen Description   Final    URINE, CATHETERIZED Performed at [facility], [address] [place], [place] [address]    Special Requests   Final    NONE Performed at [facility], [address], [place], [place] [address]    Culture   Final    NO GROWTH Performed at [facility], [address], [place], [place] [address]    Report Status 07/26/2019 FINAL  Final  Respiratory Panel by RT PCR (Flu A&B, Covid) - Nasopharyngeal Swab     Status: None   Collection Time: 07/25/19 11:07 PM   Specimen:  Nasopharyngeal Swab  Result Value Ref Range Status   SARS Coronavirus 2 by RT PCR NEGATIVE NEGATIVE Final  Comment: (NOTE) SARS-CoV-2 target nucleic acids are NOT DETECTED. The SARS-CoV-2 RNA is generally detectable in upper respiratoy specimens during the acute phase of infection. The lowest concentration of SARS-CoV-2 viral copies this assay can detect is 131 copies/mL. A negative result does not preclude SARS-Cov-2 infection and should not be used as the sole basis for treatment or other patient management decisions. A negative result may occur with  improper specimen collection/handling, submission of specimen other than nasopharyngeal swab, presence of viral mutation(s) within the areas targeted by this assay, and inadequate number of viral copies (<131 copies/mL). A negative result must be combined with clinical observations, patient history, and epidemiological information. The expected result is Negative. Fact Sheet for Patients:  https://www.moore.com/ Fact Sheet for Healthcare Providers:  https://www.young.biz/ This test is not yet ap proved or cleared by the [place] FDA and  has been authorized for detection and/or diagnosis of SARS-CoV-2 by FDA under an Emergency Use Authorization (EUA). This EUA will remain  in effect (meaning this test can be used) for the duration of the COVID-19 declaration under Section 564(b)(1) of the Act, 21 U.S.C. section 360bbb-3(b)(1), unless the authorization is terminated or revoked sooner.    Influenza A by PCR NEGATIVE NEGATIVE Final   Influenza B by PCR NEGATIVE NEGATIVE Final    Comment: (NOTE) The Xpert Xpress SARS-CoV-2/FLU/RSV assay is intended as an aid in  the diagnosis of influenza from Nasopharyngeal swab specimens and  should not be used as a sole basis for treatment. Nasal washings and  aspirates are unacceptable for Xpert Xpress SARS-CoV-2/FLU/RSV  testing. Fact Sheet for  Patients: https://www.moore.com/ Fact Sheet for Healthcare Providers: https://www.young.biz/ This test is not yet approved or cleared by the [place] FDA and  has been authorized for detection and/or diagnosis of SARS-CoV-2 by  FDA under an Emergency Use Authorization (EUA). This EUA will remain  in effect (meaning this test can be used) for the duration of the  Covid-19 declaration under Section 564(b)(1) of the Act, 21  U.S.C. section 360bbb-3(b)(1), unless the authorization is  terminated or revoked. Performed at [facility], [address], [place], [place] [address]   Culture, blood (routine x 2)     Status: None   Collection Time: 07/25/19 11:45 PM   Specimen: BLOOD  Result Value Ref Range Status   Specimen Description   Final    BLOOD RIGHT ANTECUBITAL Performed at [facility], [address], [place], [place] [address]    Special Requests   Final    BOTTLES DRAWN AEROBIC AND ANAEROBIC Blood Culture adequate volume Performed at [facility], [address], [place], [place] [address]    Culture   Final    NO GROWTH 5 DAYS Performed at [facility], [address], [place], [place] [address]    Report Status 07/31/2019 FINAL  Final  Culture, blood (routine x 2)     Status: None   Collection Time: 07/25/19 11:50 PM   Specimen: BLOOD  Result Value Ref Range Status   Specimen Description   Final    BLOOD RIGHT HAND Performed at [facility], [address] [place], [place] [address]    Special Requests   Final    BOTTLES DRAWN AEROBIC AND ANAEROBIC Blood Culture results may not be optimal due to an excessive volume of blood received in culture bottles Performed at [facility], [address] [place], [place] [address]    Culture  Final    NO GROWTH 5 DAYS Performed at [facility], [address] [place],  [place] [address]    Report Status 07/31/2019 FINAL  Final  MRSA PCR Screening     Status: None   Collection Time: 07/27/19  2:42 PM   Specimen: Nasopharyngeal  Result Value Ref Range Status   MRSA by PCR NEGATIVE NEGATIVE Final    Comment:        The GeneXpert MRSA Assay (FDA approved for NASAL specimens only), is one component of a comprehensive MRSA colonization surveillance program. It is not intended to diagnose MRSA infection nor to guide or monitor treatment for MRSA infections. Performed at [facility], [address] [place], [place] [address]   CSF culture     Status: None (Preliminary result)   Collection Time: 07/30/19 12:35 PM   Specimen: PATH Cytology CSF; Cerebrospinal Fluid  Result Value Ref Range Status   Specimen Description   Final    CSF Performed at [facility], [address] [place], [place] [address]    Special Requests   Final    NONE Performed at [facility], [address], [place], [place] [address]    Gram Stain   Final    WBC PRESENT, PREDOMINANTLY PMN NO ORGANISMS SEEN CYTOSPIN SMEAR    Culture   Final    NO GROWTH 2 DAYS Performed at [facility], [address], [place], [place] [address]    Report Status PENDING  Incomplete  Culture, fungus without smear     Status: None (Preliminary result)   Collection Time: 07/30/19 12:35 PM   Specimen: PATH Cytology CSF; Cerebrospinal Fluid  Result Value Ref Range Status   Specimen Description   Final    CSF Performed at [facility], [address], [place], [place] [address]    Special Requests   Final    NONE Performed at [facility], [address], [place], [place] [address]    Culture   Final    NO FUNGUS ISOLATED AFTER 2 DAYS Performed at [facility], [address] [place], [place] [address]    Report Status PENDING  Incomplete         Radiology Studies: DG Chest Port  1 View  Result Date: 07/31/2019 CLINICAL DATA:  Respiratory failure EXAM: PORTABLE CHEST 1 VIEW COMPARISON:  07/28/2019 FINDINGS: There is increasing interstitial and more patchy infrahilar opacities with indistinct vascularity. Cardiac prominence is similar to comparison exams. No pneumothorax or visible effusion. No acute osseous or soft tissue abnormality. Telemetry leads overlie the chest. IMPRESSION: Increasing interstitial and more patchy infrahilar opacities with indistinct vascularity. Findings could suggest some developing edema and/or atelectasis. Electronically Signed   By: Kreg Shropshire M.D.   On: 07/31/2019 07:04   DG FLUORO GUIDE LUMBAR PUNCTURE  Result Date: 07/30/2019 CLINICAL DATA:  Acute encephalopathy EXAM: DIAGNOSTIC LUMBAR PUNCTURE UNDER FLUOROSCOPIC GUIDANCE FLUOROSCOPY TIME:  Fluoroscopy Time:  0 minutes, 30 seconds Radiation Exposure Index (if provided by the fluoroscopic device): 5.9 mGy Number of Acquired Spot Images: 0 PROCEDURE: I discussed the risks (including hemorrhage, infection, and nerve damage, among others), benefits, and alternatives to fluoroscopically guided lumbar puncture with patient's mother by telephone, as the patient is unable to consent and has altered mental status. We specifically discussed the high likelihood of technical success of the procedure. The patient's mother understood and elected for the patient undergo the procedure.  Standard time-out was employed. Following sterile skin prep and local anesthetic administration consisting of 1 percent lidocaine, a 20 gauge spinal needle was advanced without difficulty into the thecal sac at the L4-5 level under fluoroscopic guidance. Initially blood tinged but subsequent clearing CSF was returned. CSF pressure was subjectively low to normal, a quantitative measurement was not obtained. A total of 10 cc of CSF was collected in 4 vials. The needle was subsequently removed and the skin cleansed and bandaged. No immediate  complications were observed. IMPRESSION: 1. Technically successful fluoroscopically guided lumbar puncture at the L4-5 level, without immediate complication. Electronically Signed   By: Gaylyn Rong M.D.   On: 07/30/2019 13:15        Scheduled Meds: . amLODipine  5 mg Oral Daily  . benztropine  1 mg Oral Daily  . buPROPion  300 mg Oral Daily  . Chlorhexidine Gluconate Cloth  6 each Topical Daily  . cyanocobalamin  1,000 mcg Subcutaneous Daily  . enoxaparin (LOVENOX) injection  40 mg Subcutaneous Daily  . folic acid  1 mg Oral Daily  . gabapentin  300 mg Oral BID  . ibuprofen  800 mg Oral Once  . insulin aspart  0-5 Units Subcutaneous QHS  . insulin aspart  0-9 Units Subcutaneous TID WC  . losartan  50 mg Oral Daily  . pantoprazole  40 mg Oral Daily  . thiamine  100 mg Oral Daily  . [START ON 08/07/2019] vitamin B-12  2,000 mcg Oral Daily   Continuous Infusions: . sodium chloride Stopped (07/28/19 1838)  . acyclovir 900 mg (08/01/19 0846)  . ampicillin (OMNIPEN) IV 2 g (08/01/19 1024)  . cefTRIAXone (ROCEPHIN)  IV Stopped (08/01/19 0538)  . lactated ringers Stopped (07/31/19 1026)  . vancomycin Stopped (07/31/19 2356)     LOS: 6 days    Time spent: 39 minutes spent on chart review, discussion with nursing staff, consultants, updating family and interview/physical exam; more than 50% of that time was spent in counseling and/or coordination of care.    Alvira Philips [place], DO [facility] Available via Epic secure chat 7am-7pm After these hours, please refer to coverage provider listed on [email or web address] 08/01/2019, 11:18 AM

## 2019-08-01 NOTE — Progress Notes (Signed)
Late entry: arrived to room pt was gone to MRI will try back later

## 2019-08-01 NOTE — Progress Notes (Signed)
eLink Physician-Brief Progress Note Patient Name: [name] DOB: April 29, 1965 MRN: [id number]   Date of Service  08/01/2019  HPI/Events of Note  Patient c/o chronic back pain - Patient is on Aleve and Neurontin at home. Neurontin has been continued in the hospital . Creatinine = 0.67.   eICU Interventions  Will order: 1. Motrin 800 mg PO X 1 now.      Intervention Category Major Interventions: Other:  Destiney Sanabia Cornelia Copa 08/01/2019, 2:35 AM

## 2019-08-01 NOTE — Progress Notes (Signed)
[facility] Progress Note Patient Name: REAFORD MIODUSZEWSKI DOB: 05/31/1965 MRN: [id number]   Date of Service  08/01/2019  HPI/Events of Note  K+ = 3.1 and Creatinine = 0.83.  eICU Interventions  Will replace K+.      Intervention Category Major Interventions: Electrolyte abnormality - evaluation and management  Whitleigh Garramone Eugene 08/01/2019, 5:15 AM

## 2019-08-01 NOTE — Procedures (Signed)
Patient Name: LINDELL PEDDY  MRN: [id number]  Epilepsy Attending: Lora Havens  Referring Physician/Provider: Dr. Marykay Lex Date: 08/01/2019 Duration: 25.21 mins  Patient history: 54 year old male with altered mental status and abnormal LP.  EEG to evaluate for seizures.  Level of alertness: Awake  AEDs during EEG study: Gabapentin  Technical aspects: This EEG study was done with scalp electrodes positioned according to the 10-20 International system of electrode placement. Electrical activity was acquired at a sampling rate of 500Hz  and reviewed with a high frequency filter of 70Hz  and a low frequency filter of 1Hz . EEG data were recorded continuously and digitally stored.   Description: The posterior dominant rhythm consists of 9 Hz activity of moderate voltage (25-35 uV) seen predominantly in posterior head regions, symmetric and reactive to eye opening and eye closing. Hyperventilation and photic stimulation were not performed.  IMPRESSION: This study is within normal limits. No seizures or epileptiform discharges were seen throughout the recording.  Kevona Lupinacci Barbra Sarks

## 2019-08-01 NOTE — Telephone Encounter (Signed)
I did call Dr. Louanne Skye and notified him that the patient was in the ICU @ [name], he states that he will go by there and see  him when he could.  I called and lmom for [name] on her Cell phone voicemail (there was no answer at the home number)

## 2019-08-01 NOTE — Progress Notes (Signed)
   08/01/19 2202  Assess: MEWS Score  Temp 99.2 F (37.3 C)  BP 128/81  Pulse Rate (!) 127  Resp 18  SpO2 99 %  O2 Device Room Air  Assess: MEWS Score  MEWS Temp 0  MEWS Systolic 0  MEWS Pulse 2  MEWS RR 0  MEWS LOC 0  MEWS Score 2  MEWS Score Color Yellow  Assess: if the MEWS score is Yellow or Red  Were vital signs taken at a resting state? Yes  Focused Assessment Documented focused assessment  Early Detection of Sepsis Score *See Row Information* Low  MEWS guidelines implemented *See Row Information* Yes  Treat  MEWS Interventions Administered scheduled meds/treatments  Take Vital Signs  Increase Vital Sign Frequency  Yellow: Q 2hr X 2 then Q 4hr X 2, if remains yellow, continue Q 4hrs  Escalate  MEWS: Escalate Yellow: discuss with charge nurse/RN and consider discussing with provider and RRT  Notify: Charge Nurse/RN  Name of Charge Nurse/RN Notified  Felicia  Date Charge Nurse/RN Notified 08/01/19  Time Charge Nurse/RN Notified 2215  Notify: Provider  Provider Name/Title Sharlet Salina  Date Provider Notified 08/01/19  Time Provider Notified 2215  Notification Type Page (Page for PRN HR medicaiton)  Notification Reason Other (Comment)  HR sustaining in the 120's.  MD paged.  IV lopressor ordered.  Will continue to monitor closely.

## 2019-08-02 ENCOUNTER — Inpatient Hospital Stay: Payer: Self-pay

## 2019-08-02 ENCOUNTER — Inpatient Hospital Stay (HOSPITAL_COMMUNITY): Payer: Medicare Other

## 2019-08-02 LAB — BASIC METABOLIC PANEL
Anion gap: 10 (ref 5–15)
BUN: 6 mg/dL (ref 6–20)
CO2: 23 mmol/L (ref 22–32)
Calcium: 8.6 mg/dL — ABNORMAL LOW (ref 8.9–10.3)
Chloride: 102 mmol/L (ref 98–111)
Creatinine, Ser: 0.7 mg/dL (ref 0.61–1.24)
GFR calc Af Amer: >60 mL/min (ref >60)
GFR calc non Af Amer: >60 mL/min (ref >60)
Glucose, Bld: 114 mg/dL — ABNORMAL HIGH (ref 70–99)
Potassium: 3.1 mmol/L — ABNORMAL LOW (ref 3.5–5.1)
Sodium: 135 mmol/L (ref 135–145)

## 2019-08-02 LAB — GLUCOSE, CAPILLARY
Glucose-Capillary: 104 mg/dL — ABNORMAL HIGH (ref 70–99)
Glucose-Capillary: 97 mg/dL (ref 70–99)
Glucose-Capillary: 108 mg/dL — ABNORMAL HIGH (ref 70–99)
Glucose-Capillary: 107 mg/dL — ABNORMAL HIGH (ref 70–99)

## 2019-08-02 LAB — MAGNESIUM: Magnesium: 1.7 mg/dL (ref 1.7–2.4)

## 2019-08-02 MED ORDER — DILTIAZEM HCL 30 MG PO TABS
30.0000 mg | ORAL_TABLET | Freq: Four times a day (QID) | ORAL | Status: DC
  Administered 2019-08-02 – 2019-08-03 (×4): 30 mg via ORAL
  Filled 2019-08-02 (×4): qty 1

## 2019-08-02 MED ORDER — POTASSIUM CHLORIDE CRYS ER 20 MEQ PO TBCR
40.0000 meq | EXTENDED_RELEASE_TABLET | ORAL | Status: AC
  Administered 2019-08-02 (×2): 40 meq via ORAL
  Filled 2019-08-02 (×3): qty 2

## 2019-08-02 MED ORDER — SODIUM CHLORIDE 0.9% FLUSH
10.0000 mL | INTRAVENOUS | Status: DC | PRN
  Administered 2019-08-11 – 2019-08-30 (×5): 10 mL

## 2019-08-02 MED ORDER — CHLORHEXIDINE GLUCONATE CLOTH 2 % EX PADS
6.0000 | MEDICATED_PAD | Freq: Every day | CUTANEOUS | Status: DC
  Administered 2019-08-03 – 2019-08-26 (×21): 6 via TOPICAL

## 2019-08-02 MED ORDER — CHLORHEXIDINE GLUCONATE CLOTH 2 % EX PADS
6.0000 | MEDICATED_PAD | Freq: Every day | CUTANEOUS | Status: DC
  Administered 2019-08-02 – 2019-08-29 (×13): 6 via TOPICAL

## 2019-08-02 NOTE — Care Management Important Message (Signed)
Important Message  Patient Details IM Letter given to Dessa Phi RN Case Manager to present to the Patient Name: [name] MRN: [id number] Date of Birth: 1965/05/24   Medicare Important Message Given:  Yes     Kerin Salen 08/02/2019, 10:45 AM

## 2019-08-02 NOTE — Evaluation (Signed)
Physical Therapy Evaluation Patient Details Name: [name] MRN: [id number] DOB: [birth year] Today's Date: 08/02/2019   History of Present Illness  54 yo male admitted with toxic metabolic encephalopathy, bacterial meningitis, unwitness fall 5/2. Hx of recent lumbar sg L3-L5 03/2019, polysubstance abuse, schizophrenia, ETOH abuse, CVA, bipolar d/o, back pain, L ankle fusion 2019, R TKA 2018, asthma.  Clinical Impression  On eval, pt required Min assist for mobility. He walked ~200 feet with his rollator. Pt presents with general weakness, and impaired gait/balance. LOB x 1 while ambulating (changing direction/turning). Pt rated back pain 8/10 during session. Will continue to follow and progress activity as tolerated.      Follow Up Recommendations Home health PT;Supervision/Assistance - 24 hour    Equipment Recommendations  None recommended by PT    Recommendations for Other Services       Precautions / Restrictions Precautions Precautions: Fall Precaution Comments: lumbar sg 03/2019 Restrictions Weight Bearing Restrictions: No      Mobility  Bed Mobility Overal bed mobility: Needs Assistance Bed Mobility: Supine to Sit;Sit to Supine     Supine to sit: Min assist;HOB elevated Sit to supine: Min assist;HOB elevated   General bed mobility comments: Assist for trunk to upright and LEs back onto bed.  Transfers Overall transfer level: Needs assistance Equipment used: 4-wheeled walker Transfers: Sit to/from Stand Sit to Stand: Min assist         General transfer comment: Assist to steady. VCs safety, hand placement.  Ambulation/Gait Ambulation/Gait assistance: Min assist Gait Distance (Feet): 200 Feet Assistive device: 4-wheeled walker Gait Pattern/deviations: Step-through pattern;Decreased stride length     General Gait Details: LOB x 1 requiring assist to prevent fall. Assist provided throughout distance to steady. Pt tolerated distance well.  Stairs            Wheelchair Mobility    Modified Rankin (Stroke Patients Only)       Balance Overall balance assessment: Needs assistance;History of Falls         Standing balance support: Bilateral upper extremity supported Standing balance-[name]: Poor                               Pertinent Vitals/Pain Pain Assessment: 0-10 Pain Score: 8  Faces Pain Scale: Hurts a little bit Pain Location: back Pain Descriptors / Indicators: Aching;Sore;Discomfort Pain Intervention(s): Premedicated before session;Repositioned    Home Living Family/patient expects to be discharged to:: Private residence Living Arrangements: Spouse/significant other Available Help at Discharge: Family;Available 24 hours/day Type of Home: House Home Access: Level entry     Home Layout: One level Home Equipment: Walker - 2 wheels;Cane - single point;Walker - 4 wheels;Shower seat Additional Comments: Pt has access to rollator and RW, but pt does not own his own    Prior Function Level of Independence: Independent         Comments: Uses rollator at baseline. Reports independence with ADLs.     Hand Dominance   Dominant Hand: Right    Extremity/Trunk Assessment   Upper Extremity Assessment Upper Extremity Assessment: Defer to OT evaluation    Lower Extremity Assessment Lower Extremity Assessment: Generalized weakness    Cervical / Trunk Assessment Cervical / Trunk Assessment: Normal  Communication   Communication: No difficulties  Cognition Arousal/Alertness: Awake/alert Behavior During Therapy: WFL for tasks assessed/performed Overall Cognitive Status: Within Functional Limits for tasks assessed  General Comments      Exercises     Assessment/Plan    PT Assessment Patient needs continued PT services  PT Problem List Decreased strength;Decreased mobility;Decreased activity tolerance;Decreased balance;Decreased  knowledge of use of DME;Pain;Decreased safety awareness       PT Treatment Interventions DME instruction;Gait training;Therapeutic activities;Therapeutic exercise;Patient/family education;Balance training;Functional mobility training    PT Goals (Current goals can be found in the Care Plan section)  Acute Rehab PT Goals Patient Stated Goal: to get better PT Goal Formulation: With patient Time For Goal Achievement: 08/16/19 Potential to Achieve Goals: Good    Frequency Min 3X/week   Barriers to discharge        Co-evaluation               AM-PAC PT "6 Clicks" Mobility  Outcome Measure Help needed turning from your back to your side while in a flat bed without using bedrails?: A Little Help needed moving from lying on your back to sitting on the side of a flat bed without using bedrails?: A Little Help needed moving to and from a bed to a chair (including a wheelchair)?: A Little Help needed standing up from a chair using your arms (e.g., wheelchair or bedside chair)?: A Little Help needed to walk in hospital room?: A Little Help needed climbing 3-5 steps with a railing? : A Little 6 Click Score: 18    End of Session Equipment Utilized During Treatment: Gait belt Activity Tolerance: Patient tolerated treatment well Patient left: in bed;with call bell/phone within reach;with bed alarm set   PT Visit Diagnosis: History of falling (Z91.81);Difficulty in walking, not elsewhere classified (R26.2);Pain;Muscle weakness (generalized) (M62.81) Pain - part of body: (back)    Time: 6962-9528 PT Time Calculation (min) (ACUTE ONLY): 17 min   Charges:   PT Evaluation $PT Eval Low Complexity: 1 Low             Danyka Merlin P, PT Acute Rehabilitation

## 2019-08-02 NOTE — Progress Notes (Addendum)
PROGRESS NOTE    [name]  ZOX:[id number] DOB: 05/07/1965 DOA: 07/25/2019 PCP: [name], MD   Brief Narrative:  54 year old gentleman with past medical history remarkable for HTN, CVA, GERD, depression/anxiety, chronic pain syndrome, EtOH dependence, history of polysubstance abuse including alcohol, PCP, heroin, cocaine who was brought in by EMS after being found on the sidewalk with increased heart rate and respiratory rate.  Admitted for polysubstance abuse, encephalopathy.  Initially requiring Precedex drip went to the ICU for agitation.   Assessment & Plan:   Active Problems:   Nicotine dependence, cigarettes, uncomplicated   Toxic metabolic encephalopathy   Polysubstance overdose   Acute kidney injury (HCC)   Chronic pain syndrome   SIRS (systemic inflammatory response syndrome) (HCC)   Delirium   Altered mental status   Acute metabolic encephalopathy: Resolved Suspect secondary to polysubstance abuse. Consideration of meningitis, HIV/RPR negative.  Ammonia normal. B1 levels-pending  Vitamin B12 deficiency Aggressive repletion.  Continue B12 1000 mcg subcu x1 week followed by 2000 mcg p.o. daily; recommend repeat B12 level 1 month  Bacterial meningitis Concerns for this due to fevers chills and change in mental status.  Empirically on ampicillin, acyclovir, Rocephin and vancomycin.  Neurology following.  LP performed 5/2 with delays therefore cultures may remain negative. --HSV: Pending --VLDL: Pending --CSF fungal culture: No growth to date --CSF culture: No growth to date --MR brain with and without contrast: Normal --EEG: Normal --Neuro recommends 14-day course of ampicillin, ayclovir, vancomycin, ceftriaxone delay in obtaining LP and improvement of mental status while on antibiotic therapy  Polysubstance abuse with EtOH, amphetamine, opiates, benzodiazepine Hx PCP, cocaine, heroin abuse Off Precedex drip.  Continue folic acid,  thiamine.  Essential hypertension Sinus tachycardia Change Norvasc to cardizem due to sinus tachycardia Losartan 50 mg daily  Chronic pain syndrome Gabapentin twice daily, as needed Percocet  GERD: continue PPI  Depression/anxiety: Bupropion daily.  Ativan as needed  Weakness: --PT evaluation  DVT prophylaxis: Lovenox Code Status: Full code Family Communication:  None  Status is: Inpatient  Remains inpatient appropriate because:Ongoing active pain requiring inpatient pain management   Dispo: The patient is from: Home              Anticipated d/c is to: Home              Anticipated d/c date is: > 3 days              Patient currently is medically stable to d/c.  Maintain hospital stay to complete his IV antibiotic course, unsafe for discharge with PICC line given his significant amount of polysubstance abuse.  Subjective: Complaining of mild back pain but no other complaints.  Review of Systems Otherwise negative except as per HPI, including: General: Denies fever, chills, night sweats or unintended weight loss. Resp: Denies cough, wheezing, shortness of breath. Cardiac: Denies chest pain, palpitations, orthopnea, paroxysmal nocturnal dyspnea. GI: Denies abdominal pain, nausea, vomiting, diarrhea or constipation GU: Denies dysuria, frequency, hesitancy or incontinence MS: Denies muscle aches, joint pain or swelling Neuro: Denies headache, neurologic deficits (focal weakness, numbness, tingling), abnormal gait Psych: Denies anxiety, depression, SI/HI/AVH Skin: Denies new rashes or lesions ID: Denies sick contacts, exotic exposures, travel  Examination:  General exam: Appears calm and comfortable  Respiratory system: Clear to auscultation. Respiratory effort normal. Cardiovascular system: S1 & S2 heard, RRR. No JVD, murmurs, rubs, gallops or clicks. No pedal edema. Gastrointestinal system: Abdomen is nondistended, soft and nontender. No organomegaly or masses  felt. Normal  bowel sounds heard. Central nervous system: Alert and oriented. No focal neurological deficits. Extremities: Symmetric 5 x 5 power. Skin: No rashes, lesions or ulcers Psychiatry: Judgement and insight appear normal. Mood & affect appropriate.     Objective: Vitals:   08/01/19 2202 08/02/19 0003 08/02/19 0154 08/02/19 0528  BP: 128/81 (!) 124/94 (!) 130/98 138/80  Pulse: (!) 127 (!) 108 (!) 105 (!) 112  Resp: 18 18 20 20   Temp: 99.2 F (37.3 C) 99 F (37.2 C) 98.6 F (37 C) 98.8 F (37.1 C)  TempSrc: Oral Oral Oral Oral  SpO2: 99% 98% 100% 99%  Weight:      Height:        Intake/Output Summary (Last 24 hours) at 08/02/2019 0900 Last data filed at 08/02/2019 0818 Gross per 24 hour  Intake 1198 ml  Output 1550 ml  Net -352 ml   Filed Weights   07/26/19 1858  Weight: 94.3 kg     Data Reviewed:   CBC: Recent Labs  Lab 07/27/19 0550 07/28/19 0549 07/29/19 0240 07/31/19 0321 08/01/19 0212  WBC 10.9* 12.7* 12.7* 9.4 10.4  NEUTROABS 8.8*  --   --  7.1  --   HGB 9.2* 9.4* 10.2* 10.2* 9.3*  HCT 29.8* 30.3* 33.5* 34.3* 31.1*  MCV 77.6* 77.5* 78.1* 79.4* 78.9*  PLT 466* 447* 520* 511* 487*   Basic Metabolic Panel: Recent Labs  Lab 07/27/19 0550 07/27/19 0550 07/28/19 0549 07/29/19 0240 07/31/19 0321 08/01/19 0212 08/02/19 0448  NA 137   < > 134* 134* 136 133* 135  K 3.2*   < > 3.3* 3.5 2.8* 3.1* 3.1*  CL 101   < > 100 101 102 104 102  CO2 26   < > 23 20* 22 20* 23  GLUCOSE 96   < > 99 93 112* 131* 114*  BUN 6   < > 9 9 8 9 6   CREATININE 0.63   < > 0.65 0.64 0.67 0.83 0.70  CALCIUM 8.5*   < > 8.5* 8.3* 8.5* 8.2* 8.6*  MG 1.7  --  1.8  --   --   --  1.7  PHOS  --   --  3.5  --   --   --   --    < > = values in this interval not displayed.   GFR: Estimated Creatinine Clearance: 123.1 mL/min (by C-G formula based on SCr of 0.7 mg/dL). Liver Function Tests: Recent Labs  Lab 07/27/19 0550 08/01/19 0212  AST 36 22  ALT 11 13  ALKPHOS 136*  158*  BILITOT 0.6 0.4  PROT 6.5 6.6  ALBUMIN 2.9* 2.9*   No results for input(s): LIPASE, AMYLASE in the last 168 hours. No results for input(s): AMMONIA in the last 168 hours. Coagulation Profile: No results for input(s): INR, PROTIME in the last 168 hours. Cardiac Enzymes: No results for input(s): CKTOTAL, CKMB, CKMBINDEX, TROPONINI in the last 168 hours. BNP (last 3 results) No results for input(s): PROBNP in the last 8760 hours. HbA1C: No results for input(s): HGBA1C in the last 72 hours. CBG: Recent Labs  Lab 08/01/19 0811 08/01/19 1123 08/01/19 1709 08/01/19 2205 08/02/19 0726  GLUCAP 109* 118* 90 114* 104*   Lipid Profile: No results for input(s): CHOL, HDL, LDLCALC, TRIG, CHOLHDL, LDLDIRECT in the last 72 hours. Thyroid Function Tests: No results for input(s): TSH, T4TOTAL, FREET4, T3FREE, THYROIDAB in the last 72 hours. Anemia Panel: No results for input(s): VITAMINB12, FOLATE, FERRITIN, TIBC, IRON, RETICCTPCT  in the last 72 hours. Sepsis Labs: No results for input(s): PROCALCITON, LATICACIDVEN in the last 168 hours.  Recent Results (from the past 240 hour(s))  Urine culture     Status: None   Collection Time: 07/25/19  6:05 PM   Specimen: Urine, Catheterized  Result Value Ref Range Status   Specimen Description   Final    URINE, CATHETERIZED Performed at [facility], [address], [place], [place] [address]    Special Requests   Final    NONE Performed at [facility], [address] [place], [place] [address]    Culture   Final    NO GROWTH Performed at [facility], [address] [place], [place] [address]    Report Status 07/26/2019 FINAL  Final  Respiratory Panel by RT PCR (Flu A&B, Covid) - Nasopharyngeal Swab     Status: None   Collection Time: 07/25/19 11:07 PM   Specimen: Nasopharyngeal Swab  Result Value Ref Range Status   SARS Coronavirus 2 by RT PCR NEGATIVE NEGATIVE Final    Comment:  (NOTE) SARS-CoV-2 target nucleic acids are NOT DETECTED. The SARS-CoV-2 RNA is generally detectable in upper respiratoy specimens during the acute phase of infection. The lowest concentration of SARS-CoV-2 viral copies this assay can detect is 131 copies/mL. A negative result does not preclude SARS-Cov-2 infection and should not be used as the sole basis for treatment or other patient management decisions. A negative result may occur with  improper specimen collection/handling, submission of specimen other than nasopharyngeal swab, presence of viral mutation(s) within the areas targeted by this assay, and inadequate number of viral copies (<131 copies/mL). A negative result must be combined with clinical observations, patient history, and epidemiological information. The expected result is Negative. Fact Sheet for Patients:  https://www.moore.com/ Fact Sheet for Healthcare Providers:  https://www.young.biz/ This test is not yet ap proved or cleared by the [place] FDA and  has been authorized for detection and/or diagnosis of SARS-CoV-2 by FDA under an Emergency Use Authorization (EUA). This EUA will remain  in effect (meaning this test can be used) for the duration of the COVID-19 declaration under Section 564(b)(1) of the Act, 21 U.S.C. section 360bbb-3(b)(1), unless the authorization is terminated or revoked sooner.    Influenza A by PCR NEGATIVE NEGATIVE Final   Influenza B by PCR NEGATIVE NEGATIVE Final    Comment: (NOTE) The Xpert Xpress SARS-CoV-2/FLU/RSV assay is intended as an aid in  the diagnosis of influenza from Nasopharyngeal swab specimens and  should not be used as a sole basis for treatment. Nasal washings and  aspirates are unacceptable for Xpert Xpress SARS-CoV-2/FLU/RSV  testing. Fact Sheet for Patients: https://www.moore.com/ Fact Sheet for Healthcare  Providers: https://www.young.biz/ This test is not yet approved or cleared by the [place] FDA and  has been authorized for detection and/or diagnosis of SARS-CoV-2 by  FDA under an Emergency Use Authorization (EUA). This EUA will remain  in effect (meaning this test can be used) for the duration of the  Covid-19 declaration under Section 564(b)(1) of the Act, 21  U.S.C. section 360bbb-3(b)(1), unless the authorization is  terminated or revoked. Performed at [facility], [address], [place], [place] [address]   Culture, blood (routine x 2)     Status: None   Collection Time: 07/25/19 11:45 PM   Specimen: BLOOD  Result Value Ref Range Status   Specimen Description   Final    BLOOD RIGHT  ANTECUBITAL Performed at [facility], [address] [place], [place] [address]    Special Requests   Final    BOTTLES DRAWN AEROBIC AND ANAEROBIC Blood Culture adequate volume Performed at [facility], [address], [place], [place] [address]    Culture   Final    NO GROWTH 5 DAYS Performed at [facility], [address] [place], [place] [address]    Report Status 07/31/2019 FINAL  Final  Culture, blood (routine x 2)     Status: None   Collection Time: 07/25/19 11:50 PM   Specimen: BLOOD  Result Value Ref Range Status   Specimen Description   Final    BLOOD RIGHT HAND Performed at [facility], [address] [place], [place] [address]    Special Requests   Final    BOTTLES DRAWN AEROBIC AND ANAEROBIC Blood Culture results may not be optimal due to an excessive volume of blood received in culture bottles Performed at [facility], [address], [place], [place] [address]    Culture   Final    NO GROWTH 5 DAYS Performed at [facility], [address] [place], [place] [address]    Report Status 07/31/2019 FINAL  Final  MRSA PCR Screening      Status: None   Collection Time: 07/27/19  2:42 PM   Specimen: Nasopharyngeal  Result Value Ref Range Status   MRSA by PCR NEGATIVE NEGATIVE Final    Comment:        The GeneXpert MRSA Assay (FDA approved for NASAL specimens only), is one component of a comprehensive MRSA colonization surveillance program. It is not intended to diagnose MRSA infection nor to guide or monitor treatment for MRSA infections. Performed at [facility], [address] [place], [place] [address]   CSF culture     Status: None (Preliminary result)   Collection Time: 07/30/19 12:35 PM   Specimen: PATH Cytology CSF; Cerebrospinal Fluid  Result Value Ref Range Status   Specimen Description   Final    CSF Performed at [facility], [address] [place], [place] [address]    Special Requests   Final    NONE Performed at [facility], [address] [place], [place] [address]    Gram Stain   Final    WBC PRESENT, PREDOMINANTLY PMN NO ORGANISMS SEEN CYTOSPIN SMEAR    Culture   Final    NO GROWTH 3 DAYS Performed at [facility], [address], [place], [place] [address]    Report Status PENDING  Incomplete  Culture, fungus without smear     Status: None (Preliminary result)   Collection Time: 07/30/19 12:35 PM   Specimen: PATH Cytology CSF; Cerebrospinal Fluid  Result Value Ref Range Status   Specimen Description   Final    CSF Performed at [facility], [address] [place], [place] [address]    Special Requests   Final    NONE Performed at [facility], [address], [place], [place] [address]    Culture   Final    NO FUNGUS ISOLATED AFTER 3 DAYS Performed at [facility], [address], [place], [place] [address]    Report Status PENDING  Incomplete         Radiology Studies: EEG  Result Date: 08/01/2019 Charlsie Quest, MD     08/01/2019  2:15 PM Patient Name:  [name] MRN: [id number] Epilepsy Attending: Charlsie Quest Referring Physician/Provider: Dr. Moises Blood Date: 08/01/2019 Duration: 25.21 mins Patient history: 54 year old male with altered mental status and abnormal LP.  EEG to evaluate for seizures. Level of alertness: Awake AEDs during EEG study: Gabapentin Technical aspects: This EEG study was done with scalp electrodes positioned according to the 10-20 International system of electrode placement. Electrical activity was acquired at a sampling rate of 500Hz  and reviewed with a high frequency filter of 70Hz  and a low frequency filter of 1Hz . EEG data were recorded continuously and digitally stored. Description: The posterior dominant rhythm consists of 9 Hz activity of moderate voltage (25-35 uV) seen predominantly in posterior head regions, symmetric and reactive to eye opening and eye closing. Hyperventilation and photic stimulation were not performed. IMPRESSION: This study is within normal limits. No seizures or epileptiform discharges were seen throughout the recording. Charlsie Quest   DG Lumbar Spine 2-3 Views  Result Date: 08/01/2019 CLINICAL DATA:  Lumbar back pain after fall 2 days ago. Patient found on sidewalk. History of lumbar spine surgery 2 months ago. EXAM: LUMBAR SPINE - 2-3 VIEW COMPARISON:  Lumbar radiograph 07/03/2019 FINDINGS: Posterior rod with intrapedicular screw fusion at L4-L5 with interbody spacer. No definite periprosthetic lucency. No endplate erosions. Hardware is in unchanged alignment. Endplate spurring with disc space narrowing at L3-L4 and L1-L2. No progressive disc height loss. No fracture. No evidence of focal bone lesion. IMPRESSION: 1. No acute abnormality. 2. Posterior rod with intrapedicular screw fusion at L4-L5 without hardware complication. 3. Degenerative disc disease at L1-L2 and L3-L4. Electronically Signed   By: Narda Rutherford M.D.   On: 08/01/2019 15:43   MR BRAIN WO CONTRAST  Result Date:  08/01/2019 CLINICAL DATA:  Encephalopathy with elevated protein in CSF EXAM: MRI HEAD WITHOUT CONTRAST TECHNIQUE: Multiplanar, multiecho pulse sequences of the brain and surrounding structures were obtained without intravenous contrast. Axial DWI, sagittal T1, and axial T2 sequences were obtained. Patient could not tolerate remainder of the study. COMPARISON:  None. FINDINGS: Brain: There is no acute infarction. There is no intracranial mass, mass effect, or edema. Patchy foci of T2 hyperintensity in the supratentorial white matter, which are nonspecific but may reflect chronic microvascular ischemic changes. Question of abnormal T2 signal intensity within some cortical sulci. There is no hydrocephalus or extra-axial fluid collection. Vascular: Major vessel flow voids at the skull base are preserved. Skull and upper cervical spine: Normal marrow signal is preserved. Sinuses/Orbits: Trace mucosal thickening.  Orbits are unremarkable. Other: Sella is unremarkable. Minimal right mastoid fluid opacification. IMPRESSION: Partial study. No acute infarction or hydrocephalus. Chronic microvascular ischemic changes. Question of abnormal T2 hyperintensity within subcortical sulci. Recommend completion of exam when able with contrast. Electronically Signed   By: Guadlupe Spanish M.D.   On: 08/01/2019 12:36   MR BRAIN W WO CONTRAST  Result Date: 08/01/2019 CLINICAL DATA:  Encephalopathy EXAM: MRI HEAD WITHOUT AND WITH CONTRAST TECHNIQUE: Multiplanar, multiecho pulse sequences of the brain and surrounding structures were obtained without and with intravenous contrast. CONTRAST:  10mL GADAVIST GADOBUTROL 1 MMOL/ML IV SOLN COMPARISON:  Incomplete MRI study of the brain earlier today FINDINGS: Brain: The patient was able to complete the study with mild motion on the postcontrast coronal images. Diagnostic quality study was obtained. Ventricle size normal. Negative for acute infarct. Patchy white matter hyperintensities bilaterally.  Brainstem and cerebellum normal. Negative for hemorrhage or mass. CSF signal in the sulci appear normal  on T2 and FLAIR. No abnormal leptomeningeal enhancement. Normal enhancement postcontrast administration Vascular: Normal arterial flow voids Skull and upper cervical spine: Negative Sinuses/Orbits: Negative Other: None IMPRESSION: No acute abnormality. Moderate chronic microvascular ischemic change in the white matter. Normal CSF signal on T2 and FLAIR without abnormal leptomeningeal enhancement. Electronically Signed   By: Marlan Palau M.D.   On: 08/01/2019 18:08   [place] EKG SITE RITE  Result Date: 08/02/2019 If Site Rite image not attached, placement could not be confirmed due to current cardiac rhythm.       Scheduled Meds: . amLODipine  5 mg Oral Daily  . benztropine  1 mg Oral Daily  . buPROPion  300 mg Oral Daily  . cyanocobalamin  1,000 mcg Subcutaneous Daily  . enoxaparin (LOVENOX) injection  40 mg Subcutaneous Daily  . folic acid  1 mg Oral Daily  . gabapentin  300 mg Oral BID  . ibuprofen  800 mg Oral Once  . insulin aspart  0-5 Units Subcutaneous QHS  . insulin aspart  0-9 Units Subcutaneous TID WC  . losartan  50 mg Oral Daily  . pantoprazole  40 mg Oral Daily  . thiamine  100 mg Oral Daily  . [START ON 08/07/2019] vitamin B-12  2,000 mcg Oral Daily   Continuous Infusions: . sodium chloride Stopped (07/28/19 1838)  . acyclovir 900 mg (08/02/19 0423)  . ampicillin (OMNIPEN) IV 2 g (08/02/19 0865)  . cefTRIAXone (ROCEPHIN)  IV 2 g (08/02/19 0530)  . lactated ringers 50 mL/hr at 08/01/19 1240  . vancomycin 1,500 mg (08/01/19 2328)     LOS: 7 days   Time spent= 40 mins    Glennie Bose Joline Maxcy, MD [facility]  If 7PM-7AM, please contact night-coverage  08/02/2019, 9:00 AM

## 2019-08-02 NOTE — Progress Notes (Signed)
Peripherally Inserted Central Catheter Placement  The IV Nurse has discussed with the patient and/or persons authorized to consent for the patient, the purpose of this procedure and the potential benefits and risks involved with this procedure.  The benefits include less needle sticks, lab draws from the catheter, and the patient may be discharged home with the catheter. Risks include, but not limited to, infection, bleeding, blood clot (thrombus formation), and puncture of an artery; nerve damage and irregular heartbeat and possibility to perform a PICC exchange if needed/ordered by physician.  Alternatives to this procedure were also discussed.  Bard Power PICC patient education guide, fact sheet on infection prevention and patient information card has been provided to patient /or left at bedside.    PICC Placement Documentation  PICC Double Lumen 08/02/19 PICC Right Cephalic 42 cm 0 cm (Active)  Indication for Insertion or Continuance of Line Prolonged intravenous therapies 08/02/19 1100  Exposed Catheter (cm) 0 cm 08/02/19 1100  Site Assessment Clean;Dry;Intact 08/02/19 1100  Lumen #1 Status Flushed;Blood return noted;Saline locked 08/02/19 1100  Lumen #2 Status Flushed;Blood return noted;Saline locked 08/02/19 1100  Dressing Type Transparent 08/02/19 1100  Dressing Status Dry;Clean;Intact;Antimicrobial disc in place 08/02/19 1100  Dressing Change Due 08/09/19 08/02/19 1100       Arcadio Cope, [place] Ramos 08/02/2019, 11:02 AM

## 2019-08-02 NOTE — Evaluation (Addendum)
Occupational Therapy Evaluation Patient Details Name: [name] MRN: [id number] DOB: 01/08/1966 Today's Date: 08/02/2019    History of Present Illness 54 year old gentleman with past medical history remarkable for HTN, CVA, GERD, depression/anxiety, chronic pain syndrome, EtOH dependence, history of polysubstance abuse including alcohol, PCP, heroin, cocaine who was brought in by EMS after being found on the sidewalk with increased heart rate and respiratory rate.  Admitted for polysubstance abuse, encephalopathy.   Clinical Impression   Mr. [name] presents with complaints of back pain, impaired balance and decreased activity tolerance resulting in decreased independence with functional mobility and ADLs. Patient will benefit from OT services to improve deficits in order to return home at discharge. Do not foresee OT needs at discharge.    Follow Up Recommendations  No OT follow up    Equipment Recommendations       Recommendations for Other Services       Precautions / Restrictions Precautions Precautions: Fall Restrictions Weight Bearing Restrictions: No      Mobility Bed Mobility Overal bed mobility: Needs Assistance Bed Mobility: Supine to Sit;Sit to Supine     Supine to sit: Supervision Sit to supine: Min assist   General bed mobility comments: min assist for RLE to return to bed.  Transfers   Equipment used: Rolling walker (2 wheeled)             General transfer comment: Min guard for ambulation to bathroom, toilet transfers and return to bed. Mild losses of balance but patient able to correct.    Balance Overall balance assessment: Mild deficits observed, not formally tested                                         ADL either performed or assessed with clinical judgement   ADL Overall ADL's : Needs assistance/impaired Eating/Feeding: Independent   Grooming: Wash/dry hands;Brushing hair;Min guard Grooming Details (indicate  cue type and reason): Patient sat at side of bed and brushed hair. Needed assistance to tie hair back due to a chronic rotator cuff injury. Patient stood at sink to wash hands. Mild loss of balance when exiting bathroom doorway. Upper Body Bathing: Set up   Lower Body Bathing: Min guard   Upper Body Dressing : Set up   Lower Body Dressing: Min guard   Toilet Transfer: Min guard   Toileting- Architect and Hygiene: Min guard   Tub/ Shower Transfer: Min guard   Functional mobility during ADLs: Min guard(Unsteady at times. He reports he typically moves too fast which makes him unsteady and unsafe.)       Vision   Vision Assessment?: No apparent visual deficits     Perception     Praxis      Pertinent Vitals/Pain Pain Assessment: Faces Faces Pain Scale: Hurts a little bit Pain Location: bavk Pain Descriptors / Indicators: Aching     Hand Dominance Right   Extremity/Trunk Assessment Upper Extremity Assessment Upper Extremity Assessment: Overall WFL for tasks assessed   Lower Extremity Assessment Lower Extremity Assessment: Defer to PT evaluation   Cervical / Trunk Assessment Cervical / Trunk Assessment: Normal   Communication     Cognition Arousal/Alertness: Awake/alert Behavior During Therapy: WFL for tasks assessed/performed Overall Cognitive Status: Within Functional Limits for tasks assessed  General Comments       Exercises     Shoulder Instructions      Home Living Family/patient expects to be discharged to:: Private residence Living Arrangements: Spouse/significant other Available Help at Discharge: Family;Available 24 hours/day Type of Home: House Home Access: Level entry     Home Layout: One level     Bathroom Shower/Tub: Chief Strategy Officer: Standard Bathroom Accessibility: Yes   Home Equipment: Environmental consultant - 2 wheels;Cane - single point;Walker - 4 wheels;Shower  seat          Prior Functioning/Environment Level of Independence: Independent        Comments: Uses rollator at baseline. Reports independence with ADLs.        OT Problem List: Impaired balance (sitting and/or standing);Decreased activity tolerance      OT Treatment/Interventions: Self-care/ADL training;Therapeutic activities;Patient/family education    OT Goals(Current goals can be found in the care plan section) Acute Rehab OT Goals Patient Stated Goal: To walk further OT Goal Formulation: With patient Time For Goal Achievement: 08/16/19 Potential to Achieve Goals: Good ADL Goals Pt Will Perform Grooming: standing;with supervision Pt Will Perform Lower Body Dressing: with supervision;sit to/from stand Pt Will Transfer to Toilet: with supervision;ambulating;bedside commode;grab bars Pt Will Perform Toileting - Clothing Manipulation and hygiene: with supervision;sit to/from stand Pt Will Perform Tub/Shower Transfer: with supervision;shower seat;ambulating Additional ADL Goal #1: Patient will demonstrate improving balance by ambulating in room without LOB with DME with supervisoin only  OT Frequency: Min 2X/week   Barriers to D/C:            Co-evaluation              AM-PAC OT "6 Clicks" Daily Activity     Outcome Measure Help from another person eating meals?: None Help from another person taking care of personal grooming?: A Little Help from another person toileting, which includes using toliet, bedpan, or urinal?: A Little Help from another person bathing (including washing, rinsing, drying)?: A Little Help from another person to put on and taking off regular upper body clothing?: None   6 Click Score: 17   End of Session Equipment Utilized During Treatment: Gait belt;Rolling walker  Activity Tolerance: Patient tolerated treatment well Patient left: in bed;with call bell/phone within reach;with bed alarm set  OT Visit Diagnosis: Unsteadiness on feet  (R26.81);Pain                Time: 1245-1312 OT Time Calculation (min): 27 min Charges:  OT General Charges $OT Visit: 1 Visit OT Evaluation $OT Eval Low Complexity: 1 Low OT Treatments $Self Care/Home Management : 8-22 mins  Waldron Session, OTR/L Acute Care Rehab Services  Office [phone]   Kelli Churn 08/02/2019, 2:45 PM

## 2019-08-03 ENCOUNTER — Inpatient Hospital Stay (HOSPITAL_COMMUNITY): Payer: Medicare Other

## 2019-08-03 LAB — COMPREHENSIVE METABOLIC PANEL
ALT: 10 U/L (ref 0–44)
AST: 16 U/L (ref 15–41)
Albumin: 3.1 g/dL — ABNORMAL LOW (ref 3.5–5.0)
Alkaline Phosphatase: 145 U/L — ABNORMAL HIGH (ref 38–126)
Anion gap: 11 (ref 5–15)
BUN: 5 mg/dL — ABNORMAL LOW (ref 6–20)
CO2: 23 mmol/L (ref 22–32)
Calcium: 8.4 mg/dL — ABNORMAL LOW (ref 8.9–10.3)
Chloride: 99 mmol/L (ref 98–111)
Creatinine, Ser: 0.59 mg/dL — ABNORMAL LOW (ref 0.61–1.24)
GFR calc Af Amer: >60 mL/min (ref >60)
GFR calc non Af Amer: >60 mL/min (ref >60)
Glucose, Bld: 101 mg/dL — ABNORMAL HIGH (ref 70–99)
Potassium: 3.7 mmol/L (ref 3.5–5.1)
Sodium: 133 mmol/L — ABNORMAL LOW (ref 135–145)
Total Bilirubin: 0.4 mg/dL (ref 0.3–1.2)
Total Protein: 6.6 g/dL (ref 6.5–8.1)

## 2019-08-03 LAB — CBC
HCT: 29.9 % — ABNORMAL LOW (ref 39.0–52.0)
Hemoglobin: 9.1 g/dL — ABNORMAL LOW (ref 13.0–17.0)
MCH: 23.8 pg — ABNORMAL LOW (ref 26.0–34.0)
MCHC: 30.4 g/dL (ref 30.0–36.0)
MCV: 78.1 fL — ABNORMAL LOW (ref 80.0–100.0)
Platelets: 475 10*3/uL — ABNORMAL HIGH (ref 150–400)
RBC: 3.83 MIL/uL — ABNORMAL LOW (ref 4.22–5.81)
RDW: 16.9 % — ABNORMAL HIGH (ref 11.5–15.5)
WBC: 9.3 10*3/uL (ref 4.0–10.5)
nRBC: 0 % (ref 0.0–0.2)

## 2019-08-03 LAB — CSF CULTURE W GRAM STAIN: Culture: NO GROWTH

## 2019-08-03 LAB — GLUCOSE, CAPILLARY
Glucose-Capillary: 94 mg/dL (ref 70–99)
Glucose-Capillary: 96 mg/dL (ref 70–99)
Glucose-Capillary: 130 mg/dL — ABNORMAL HIGH (ref 70–99)

## 2019-08-03 LAB — MAGNESIUM: Magnesium: 1.7 mg/dL (ref 1.7–2.4)

## 2019-08-03 MED ORDER — DILTIAZEM HCL 60 MG PO TABS
60.0000 mg | ORAL_TABLET | Freq: Four times a day (QID) | ORAL | Status: DC
  Administered 2019-08-03 – 2019-08-06 (×12): 60 mg via ORAL
  Filled 2019-08-03 (×13): qty 1

## 2019-08-03 MED ORDER — SODIUM CHLORIDE (PF) 0.9 % IJ SOLN
INTRAMUSCULAR | Status: AC
  Filled 2019-08-03: qty 50

## 2019-08-03 MED ORDER — IOHEXOL 300 MG/ML  SOLN
100.0000 mL | Freq: Once | INTRAMUSCULAR | Status: AC | PRN
  Administered 2019-08-03: 100 mL via INTRAVENOUS

## 2019-08-03 NOTE — Progress Notes (Signed)
PROGRESS NOTE    [name]  WGN:[id number] DOB: 1965-11-30 DOA: 07/25/2019 PCP: [name], MD   Brief Narrative:  54 year old gentleman with past medical history remarkable for HTN, CVA, GERD, depression/anxiety, chronic pain syndrome, EtOH dependence, history of polysubstance abuse including alcohol, PCP, heroin, cocaine who was brought in by EMS after being found on the sidewalk with increased heart rate and respiratory rate.  Admitted for polysubstance abuse, encephalopathy.  Initially requiring Precedex drip went to the ICU for agitation.Due to delay in LP, neurology recommending 14 days of IV antibiotics.  Curbside ID who recommends he can possibly shorten the course if he clinically improves and transition to p.o.   Assessment & Plan:   Active Problems:   Nicotine dependence, cigarettes, uncomplicated   Toxic metabolic encephalopathy   Polysubstance overdose   Acute kidney injury (HCC)   Chronic pain syndrome   SIRS (systemic inflammatory response syndrome) (HCC)   Delirium   Altered mental status   Acute metabolic encephalopathy: Resolved Suspect secondary to polysubstance abuse. Consideration of meningitis, HIV/RPR negative.  Ammonia normal. B1 levels-pending  Vitamin B12 deficiency Aggressive repletion.  Continue B12 1000 mcg subcu x1 week followed by 2000 mcg p.o. daily; recommend repeat B12 level 1 month  Bacterial meningitis Concerns for this due to fevers chills and change in mental status.  Empirically on ampicillin, acyclovir, Rocephin and vancomycin.  Neurology following.  LP performed 5/2 with delays therefore cultures may remain negative. --HSV: Pending; once neg- D/c Acyclovir --VDRL = neg --CSF fungal culture: No growth to date --CSF culture: No growth to date --MR brain with and without contrast: Normal --EEG: Normal --Neuro recommends 14 days of IV antibiotics-ampicillin, acyclovir, vancomycin, Rocephin. Discussed with ID, Dr Ninetta Lights- if  symptoms continue to improve, cultures remain neg - Can consider switching over to Augmentin only ( high dose ~1000mg )  for 7 more days with outpatient follow up.  Currently awaiting HSV PCR.  Lower back pain midline and paraspinal -Most likely this is chronic but given he had LP and his previous history will obtain CT lumbar spine to rule out any abnormality.  Polysubstance abuse with EtOH, amphetamine, opiates, benzodiazepine Hx PCP, cocaine, heroin abuse Off Precedex drip.  Continue folic acid, thiamine.  Essential hypertension Sinus tachycardia D/c Norvasc. Increase Cardizem Losartan 50 mg daily  Chronic pain syndrome Gabapentin twice daily, as needed Percocet  GERD: continue PPI  Depression/anxiety: Bupropion daily.  Ativan as needed  Weakness: --PT evaluation-home health  DVT prophylaxis: Lovenox Code Status: Full code Family Communication: Spoke with his mother yesterday evening.  Status is: Inpatient  Remains inpatient appropriate because:Ongoing active pain requiring inpatient pain management.  Currently on IV anti-infectives awaiting culture data.   Dispo: The patient is from: Home              Anticipated d/c is to: Home              Anticipated d/c date is: > 3 days              Patient currently is medically stable to d/c.  Maintain hospital stay to complete his IV antibiotic course, unsafe for discharge with PICC line given his significant amount of polysubstance abuse.  Subjective: Reporting of lower back pain tender to palpation.  Because of this he is having some ambulatory issues but denies any weakness or numbness in his lower extremity.  Denies any bowel and bladder incontinence.  Review of Systems Otherwise negative except as per HPI, including: General:  Denies fever, chills, night sweats or unintended weight loss. Resp: Denies cough, wheezing, shortness of breath. Cardiac: Denies chest pain, palpitations, orthopnea, paroxysmal nocturnal  dyspnea. GI: Denies abdominal pain, nausea, vomiting, diarrhea or constipation GU: Denies dysuria, frequency, hesitancy or incontinence MS: Denies muscle aches, joint pain or swelling Neuro: Denies headache, neurologic deficits (focal weakness, numbness, tingling), abnormal gait Psych: Denies anxiety, depression, SI/HI/AVH Skin: Denies new rashes or lesions ID: Denies sick contacts, exotic exposures, travel  Examination: Constitutional: Not in acute distress Respiratory: Clear to auscultation bilaterally Cardiovascular: Normal sinus rhythm, no rubs Abdomen: Nontender nondistended good bowel sounds Musculoskeletal: Midline tenderness in his spine around lumbar region.  Also paraspinal pain in the region. Skin: No rashes seen Neurologic: CN 2-12 grossly intact.  And nonfocal Psychiatric: Normal judgment and insight. Alert and oriented x 3. Normal mood.   Objective: Vitals:   08/02/19 0957 08/02/19 1203 08/02/19 2056 08/03/19 0505  BP: 122/80 (!) 138/94 (!) 149/92 124/86  Pulse: (!) 115 (!) 114 (!) 107 (!) 106  Resp: 20 20 20 18   Temp: 98.7 F (37.1 C) 98.6 F (37 C) 98.9 F (37.2 C) 98.2 F (36.8 C)  TempSrc: Oral Oral Oral Oral  SpO2: 100% 100% 95% 98%  Weight:      Height:        Intake/Output Summary (Last 24 hours) at 08/03/2019 0814 Last data filed at 08/02/2019 1735 Gross per 24 hour  Intake 720 ml  Output --  Net 720 ml   Filed Weights   07/26/19 1858  Weight: 94.3 kg     Data Reviewed:   CBC: Recent Labs  Lab 07/28/19 0549 07/29/19 0240 07/31/19 0321 08/01/19 0212 08/03/19 0306  WBC 12.7* 12.7* 9.4 10.4 9.3  NEUTROABS  --   --  7.1  --   --   HGB 9.4* 10.2* 10.2* 9.3* 9.1*  HCT 30.3* 33.5* 34.3* 31.1* 29.9*  MCV 77.5* 78.1* 79.4* 78.9* 78.1*  PLT 447* 520* 511* 487* 475*   Basic Metabolic Panel: Recent Labs  Lab 07/28/19 0549 07/28/19 0549 07/29/19 0240 07/31/19 0321 08/01/19 0212 08/02/19 0448 08/03/19 0306  NA 134*   < > 134* 136 133*  135 133*  K 3.3*   < > 3.5 2.8* 3.1* 3.1* 3.7  CL 100   < > 101 102 104 102 99  CO2 23   < > 20* 22 20* 23 23  GLUCOSE 99   < > 93 112* 131* 114* 101*  BUN 9   < > 9 8 9 6  5*  CREATININE 0.65   < > 0.64 0.67 0.83 0.70 0.59*  CALCIUM 8.5*   < > 8.3* 8.5* 8.2* 8.6* 8.4*  MG 1.8  --   --   --   --  1.7 1.7  PHOS 3.5  --   --   --   --   --   --    < > = values in this interval not displayed.   GFR: Estimated Creatinine Clearance: 123.1 mL/min (A) (by C-G formula based on SCr of 0.59 mg/dL (L)). Liver Function Tests: Recent Labs  Lab 08/01/19 0212 08/03/19 0306  AST 22 16  ALT 13 10  ALKPHOS 158* 145*  BILITOT 0.4 0.4  PROT 6.6 6.6  ALBUMIN 2.9* 3.1*   No results for input(s): LIPASE, AMYLASE in the last 168 hours. No results for input(s): AMMONIA in the last 168 hours. Coagulation Profile: No results for input(s): INR, PROTIME in the last 168 hours. Cardiac  Enzymes: No results for input(s): CKTOTAL, CKMB, CKMBINDEX, TROPONINI in the last 168 hours. BNP (last 3 results) No results for input(s): PROBNP in the last 8760 hours. HbA1C: No results for input(s): HGBA1C in the last 72 hours. CBG: Recent Labs  Lab 08/01/19 2205 08/02/19 0726 08/02/19 1201 08/02/19 1615 08/02/19 2100  GLUCAP 114* 104* 97 108* 107*   Lipid Profile: No results for input(s): CHOL, HDL, LDLCALC, TRIG, CHOLHDL, LDLDIRECT in the last 72 hours. Thyroid Function Tests: No results for input(s): TSH, T4TOTAL, FREET4, T3FREE, THYROIDAB in the last 72 hours. Anemia Panel: No results for input(s): VITAMINB12, FOLATE, FERRITIN, TIBC, IRON, RETICCTPCT in the last 72 hours. Sepsis Labs: No results for input(s): PROCALCITON, LATICACIDVEN in the last 168 hours.  Recent Results (from the past 240 hour(s))  Urine culture     Status: None   Collection Time: 07/25/19  6:05 PM   Specimen: Urine, Catheterized  Result Value Ref Range Status   Specimen Description   Final    URINE, CATHETERIZED Performed at  [facility], [address] [place], [place] [address]    Special Requests   Final    NONE Performed at [facility], [address], [place], [place] [address]    Culture   Final    NO GROWTH Performed at [facility], [address], [place], [place] [address]    Report Status 07/26/2019 FINAL  Final  Respiratory Panel by RT PCR (Flu A&B, Covid) - Nasopharyngeal Swab     Status: None   Collection Time: 07/25/19 11:07 PM   Specimen: Nasopharyngeal Swab  Result Value Ref Range Status   SARS Coronavirus 2 by RT PCR NEGATIVE NEGATIVE Final    Comment: (NOTE) SARS-CoV-2 target nucleic acids are NOT DETECTED. The SARS-CoV-2 RNA is generally detectable in upper respiratoy specimens during the acute phase of infection. The lowest concentration of SARS-CoV-2 viral copies this assay can detect is 131 copies/mL. A negative result does not preclude SARS-Cov-2 infection and should not be used as the sole basis for treatment or other patient management decisions. A negative result may occur with  improper specimen collection/handling, submission of specimen other than nasopharyngeal swab, presence of viral mutation(s) within the areas targeted by this assay, and inadequate number of viral copies (<131 copies/mL). A negative result must be combined with clinical observations, patient history, and epidemiological information. The expected result is Negative. Fact Sheet for Patients:  https://www.moore.com/ Fact Sheet for Healthcare Providers:  https://www.young.biz/ This test is not yet ap proved or cleared by the [place] FDA and  has been authorized for detection and/or diagnosis of SARS-CoV-2 by FDA under an Emergency Use Authorization (EUA). This EUA will remain  in effect (meaning this test can be used) for the duration of the COVID-19 declaration under Section 564(b)(1) of the Act, 21  U.S.C. section 360bbb-3(b)(1), unless the authorization is terminated or revoked sooner.    Influenza A by PCR NEGATIVE NEGATIVE Final   Influenza B by PCR NEGATIVE NEGATIVE Final    Comment: (NOTE) The Xpert Xpress SARS-CoV-2/FLU/RSV assay is intended as an aid in  the diagnosis of influenza from Nasopharyngeal swab specimens and  should not be used as a sole basis for treatment. Nasal washings and  aspirates are unacceptable for Xpert Xpress SARS-CoV-2/FLU/RSV  testing. Fact Sheet for Patients: https://www.moore.com/ Fact Sheet for Healthcare Providers: https://www.young.biz/ This test is not yet approved or cleared by the [place] and  has been authorized for  detection and/or diagnosis of SARS-CoV-2 by  FDA under an Emergency Use Authorization (EUA). This EUA will remain  in effect (meaning this test can be used) for the duration of the  Covid-19 declaration under Section 564(b)(1) of the Act, 21  U.S.C. section 360bbb-3(b)(1), unless the authorization is  terminated or revoked. Performed at [facility], [address], [place], [place] [address]   Culture, blood (routine x 2)     Status: None   Collection Time: 07/25/19 11:45 PM   Specimen: BLOOD  Result Value Ref Range Status   Specimen Description   Final    BLOOD RIGHT ANTECUBITAL Performed at [facility], [address] [place], [place] [address]    Special Requests   Final    BOTTLES DRAWN AEROBIC AND ANAEROBIC Blood Culture adequate volume Performed at [facility], [address], [place], [place] [address]    Culture   Final    NO GROWTH 5 DAYS Performed at [facility], [address], [place], [place] [address]    Report Status 07/31/2019 FINAL  Final  Culture, blood (routine x 2)     Status: None   Collection Time: 07/25/19 11:50 PM   Specimen: BLOOD  Result Value Ref Range Status   Specimen  Description   Final    BLOOD RIGHT HAND Performed at [facility], [address] [place], [place] [address]    Special Requests   Final    BOTTLES DRAWN AEROBIC AND ANAEROBIC Blood Culture results may not be optimal due to an excessive volume of blood received in culture bottles Performed at [facility], [address] [place], [place] [address]    Culture   Final    NO GROWTH 5 DAYS Performed at [facility], [address], [place], [place] [address]    Report Status 07/31/2019 FINAL  Final  MRSA PCR Screening     Status: None   Collection Time: 07/27/19  2:42 PM   Specimen: Nasopharyngeal  Result Value Ref Range Status   MRSA by PCR NEGATIVE NEGATIVE Final    Comment:        The GeneXpert MRSA Assay (FDA approved for NASAL specimens only), is one component of a comprehensive MRSA colonization surveillance program. It is not intended to diagnose MRSA infection nor to guide or monitor treatment for MRSA infections. Performed at [facility], [address], [place], [place] [address]   CSF culture     Status: None   Collection Time: 07/30/19 12:35 PM   Specimen: PATH Cytology CSF; Cerebrospinal Fluid  Result Value Ref Range Status   Specimen Description   Final    CSF Performed at [facility], [address], [place], [place] [address]    Special Requests   Final    NONE Performed at [facility], [address], [place], [place] [address]    Gram Stain   Final    WBC PRESENT, PREDOMINANTLY PMN NO ORGANISMS SEEN CYTOSPIN SMEAR    Culture   Final    NO GROWTH 3 DAYS Performed at [facility], [address], [place], [place] [address]    Report Status 08/03/2019 FINAL  Final  Culture, fungus without smear     Status: None (Preliminary result)   Collection Time: 07/30/19 12:35 PM   Specimen: PATH Cytology CSF; Cerebrospinal Fluid  Result Value Ref Range  Status  Specimen Description   Final    CSF Performed at [facility], [address] [place], [place] [address]    Special Requests   Final    NONE Performed at [facility], [address], [place], [place] [address]    Culture   Final    NO FUNGUS ISOLATED AFTER 3 DAYS Performed at [facility], [address], [place], [place] [address]    Report Status PENDING  Incomplete         Radiology Studies: EEG  Result Date: 08/01/2019 Charlsie Quest, MD     08/01/2019  2:15 PM Patient Name: JUNNIE AKEL MRN: [id number] Epilepsy Attending: Charlsie Quest Referring Physician/Provider: Dr. Moises Blood Date: 08/01/2019 Duration: 25.21 mins Patient history: 55 year old male with altered mental status and abnormal LP.  EEG to evaluate for seizures. Level of alertness: Awake AEDs during EEG study: Gabapentin Technical aspects: This EEG study was done with scalp electrodes positioned according to the 10-20 International system of electrode placement. Electrical activity was acquired at a sampling rate of 500Hz  and reviewed with a high frequency filter of 70Hz  and a low frequency filter of 1Hz . EEG data were recorded continuously and digitally stored. Description: The posterior dominant rhythm consists of 9 Hz activity of moderate voltage (25-35 uV) seen predominantly in posterior head regions, symmetric and reactive to eye opening and eye closing. Hyperventilation and photic stimulation were not performed. IMPRESSION: This study is within normal limits. No seizures or epileptiform discharges were seen throughout the recording. Charlsie Quest   DG Lumbar Spine 2-3 Views  Result Date: 08/01/2019 CLINICAL DATA:  Lumbar back pain after fall 2 days ago. Patient found on sidewalk. History of lumbar spine surgery 2 months ago. EXAM: LUMBAR SPINE - 2-3 VIEW COMPARISON:  Lumbar radiograph 07/03/2019 FINDINGS: Posterior rod with intrapedicular screw fusion at  L4-L5 with interbody spacer. No definite periprosthetic lucency. No endplate erosions. Hardware is in unchanged alignment. Endplate spurring with disc space narrowing at L3-L4 and L1-L2. No progressive disc height loss. No fracture. No evidence of focal bone lesion. IMPRESSION: 1. No acute abnormality. 2. Posterior rod with intrapedicular screw fusion at L4-L5 without hardware complication. 3. Degenerative disc disease at L1-L2 and L3-L4. Electronically Signed   By: Narda Rutherford M.D.   On: 08/01/2019 15:43   MR BRAIN WO CONTRAST  Result Date: 08/01/2019 CLINICAL DATA:  Encephalopathy with elevated protein in CSF EXAM: MRI HEAD WITHOUT CONTRAST TECHNIQUE: Multiplanar, multiecho pulse sequences of the brain and surrounding structures were obtained without intravenous contrast. Axial DWI, sagittal T1, and axial T2 sequences were obtained. Patient could not tolerate remainder of the study. COMPARISON:  None. FINDINGS: Brain: There is no acute infarction. There is no intracranial mass, mass effect, or edema. Patchy foci of T2 hyperintensity in the supratentorial white matter, which are nonspecific but may reflect chronic microvascular ischemic changes. Question of abnormal T2 signal intensity within some cortical sulci. There is no hydrocephalus or extra-axial fluid collection. Vascular: Major vessel flow voids at the skull base are preserved. Skull and upper cervical spine: Normal marrow signal is preserved. Sinuses/Orbits: Trace mucosal thickening.  Orbits are unremarkable. Other: Sella is unremarkable. Minimal right mastoid fluid opacification. IMPRESSION: Partial study. No acute infarction or hydrocephalus. Chronic microvascular ischemic changes. Question of abnormal T2 hyperintensity within subcortical sulci. Recommend completion of exam when able with contrast. Electronically Signed   By: Guadlupe Spanish M.D.   On: 08/01/2019 12:36   MR  BRAIN W WO CONTRAST  Result Date: 08/01/2019 CLINICAL DATA:   Encephalopathy EXAM: MRI HEAD WITHOUT AND WITH CONTRAST TECHNIQUE: Multiplanar, multiecho pulse sequences of the brain and surrounding structures were obtained without and with intravenous contrast. CONTRAST:  10mL GADAVIST GADOBUTROL 1 MMOL/ML IV SOLN COMPARISON:  Incomplete MRI study of the brain earlier today FINDINGS: Brain: The patient was able to complete the study with mild motion on the postcontrast coronal images. Diagnostic quality study was obtained. Ventricle size normal. Negative for acute infarct. Patchy white matter hyperintensities bilaterally. Brainstem and cerebellum normal. Negative for hemorrhage or mass. CSF signal in the sulci appear normal on T2 and FLAIR. No abnormal leptomeningeal enhancement. Normal enhancement postcontrast administration Vascular: Normal arterial flow voids Skull and upper cervical spine: Negative Sinuses/Orbits: Negative Other: None IMPRESSION: No acute abnormality. Moderate chronic microvascular ischemic change in the white matter. Normal CSF signal on T2 and FLAIR without abnormal leptomeningeal enhancement. Electronically Signed   By: Marlan Palau M.D.   On: 08/01/2019 18:08   DG CHEST PORT 1 VIEW  Result Date: 08/02/2019 CLINICAL DATA:  PICC line placement. EXAM: PORTABLE CHEST 1 VIEW COMPARISON:  07/31/2019 FINDINGS: The right PICC line tip is in good position with its tip in the distal SVC near the cavoatrial junction. No complicating features. The cardiac silhouette, mediastinal and hilar contours are within normal limits and stable. No acute pulmonary findings. IMPRESSION: 1. Right PICC line tip in good position. 2. No acute cardiopulmonary findings. Electronically Signed   By: Rudie Meyer M.D.   On: 08/02/2019 11:50   [place] EKG SITE RITE  Result Date: 08/02/2019 If Site Rite image not attached, placement could not be confirmed due to current cardiac rhythm.       Scheduled Meds: . benztropine  1 mg Oral Daily  . buPROPion  300 mg Oral Daily  .  Chlorhexidine Gluconate Cloth  6 each Topical Daily  . Chlorhexidine Gluconate Cloth  6 each Topical Daily  . cyanocobalamin  1,000 mcg Subcutaneous Daily  . diltiazem  30 mg Oral Q6H  . enoxaparin (LOVENOX) injection  40 mg Subcutaneous Daily  . folic acid  1 mg Oral Daily  . gabapentin  300 mg Oral BID  . ibuprofen  800 mg Oral Once  . insulin aspart  0-5 Units Subcutaneous QHS  . insulin aspart  0-9 Units Subcutaneous TID WC  . losartan  50 mg Oral Daily  . pantoprazole  40 mg Oral Daily  . thiamine  100 mg Oral Daily  . [START ON 08/07/2019] vitamin B-12  2,000 mcg Oral Daily   Continuous Infusions: . sodium chloride Stopped (07/28/19 1838)  . acyclovir 900 mg (08/03/19 0400)  . ampicillin (OMNIPEN) IV 2 g (08/03/19 0523)  . cefTRIAXone (ROCEPHIN)  IV 2 g (08/03/19 0600)  . lactated ringers 50 mL/hr at 08/02/19 1917  . vancomycin 1,500 mg (08/02/19 2147)     LOS: 8 days   Time spent= 40 mins    Navdeep Halt Joline Maxcy, MD [facility]  If 7PM-7AM, please contact night-coverage  08/03/2019, 8:14 AM

## 2019-08-03 NOTE — Progress Notes (Signed)
CT lumbar spine concerning for discitis/ Lumbar osteomyelitis with possible epidural abscess.  Ordered MRI Lumbar spine with contrast. Discussed with the patient, he is still having back pain requesting to do it tomorrow morning instead of today because of back pain from moving around. Currently no red flags.   Cont current Abx.   Gerlean Ren MD [facility]

## 2019-08-04 ENCOUNTER — Ambulatory Visit: Payer: Medicare Other | Admitting: Specialist

## 2019-08-04 ENCOUNTER — Telehealth: Payer: Self-pay | Admitting: Specialist

## 2019-08-04 ENCOUNTER — Inpatient Hospital Stay (HOSPITAL_COMMUNITY): Payer: Medicare Other

## 2019-08-04 DIAGNOSIS — M4626 Osteomyelitis of vertebra, lumbar region: Secondary | ICD-10-CM

## 2019-08-04 DIAGNOSIS — E538 Deficiency of other specified B group vitamins: Secondary | ICD-10-CM

## 2019-08-04 LAB — COMPREHENSIVE METABOLIC PANEL
ALT: 10 U/L (ref 0–44)
AST: 16 U/L (ref 15–41)
Albumin: 3.1 g/dL — ABNORMAL LOW (ref 3.5–5.0)
Alkaline Phosphatase: 147 U/L — ABNORMAL HIGH (ref 38–126)
Anion gap: 10 (ref 5–15)
BUN: 7 mg/dL (ref 6–20)
CO2: 24 mmol/L (ref 22–32)
Calcium: 8.6 mg/dL — ABNORMAL LOW (ref 8.9–10.3)
Chloride: 97 mmol/L — ABNORMAL LOW (ref 98–111)
Creatinine, Ser: 0.69 mg/dL (ref 0.61–1.24)
GFR calc Af Amer: >60 mL/min (ref >60)
GFR calc non Af Amer: >60 mL/min (ref >60)
Glucose, Bld: 131 mg/dL — ABNORMAL HIGH (ref 70–99)
Potassium: 3.2 mmol/L — ABNORMAL LOW (ref 3.5–5.1)
Sodium: 131 mmol/L — ABNORMAL LOW (ref 135–145)
Total Bilirubin: 0.3 mg/dL (ref 0.3–1.2)
Total Protein: 6.7 g/dL (ref 6.5–8.1)

## 2019-08-04 LAB — CBC
HCT: 29.9 % — ABNORMAL LOW (ref 39.0–52.0)
Hemoglobin: 9.3 g/dL — ABNORMAL LOW (ref 13.0–17.0)
MCH: 24.3 pg — ABNORMAL LOW (ref 26.0–34.0)
MCHC: 31.1 g/dL (ref 30.0–36.0)
MCV: 78.1 fL — ABNORMAL LOW (ref 80.0–100.0)
Platelets: 489 10*3/uL — ABNORMAL HIGH (ref 150–400)
RBC: 3.83 MIL/uL — ABNORMAL LOW (ref 4.22–5.81)
RDW: 16.9 % — ABNORMAL HIGH (ref 11.5–15.5)
WBC: 11.8 10*3/uL — ABNORMAL HIGH (ref 4.0–10.5)
nRBC: 0 % (ref 0.0–0.2)

## 2019-08-04 LAB — GLUCOSE, CAPILLARY
Glucose-Capillary: 137 mg/dL — ABNORMAL HIGH (ref 70–99)
Glucose-Capillary: 136 mg/dL — ABNORMAL HIGH (ref 70–99)
Glucose-Capillary: 115 mg/dL — ABNORMAL HIGH (ref 70–99)

## 2019-08-04 LAB — MAGNESIUM: Magnesium: 1.6 mg/dL — ABNORMAL LOW (ref 1.7–2.4)

## 2019-08-04 MED ORDER — LORAZEPAM 2 MG/ML IJ SOLN: 0.5000 mg | INTRAMUSCULAR | Status: DC | PRN

## 2019-08-04 MED ORDER — THIAMINE HCL 100 MG PO TABS
250.0000 mg | ORAL_TABLET | Freq: Every day | ORAL | Status: DC
  Administered 2019-08-05 – 2019-09-05 (×32): 250 mg via ORAL
  Filled 2019-08-04 (×32): qty 3

## 2019-08-04 MED ORDER — ACETAMINOPHEN 500 MG PO TABS
1000.0000 mg | ORAL_TABLET | Freq: Three times a day (TID) | ORAL | Status: DC
  Administered 2019-08-04 – 2019-08-27 (×68): 1000 mg via ORAL
  Filled 2019-08-04 (×68): qty 2

## 2019-08-04 MED ORDER — MAGNESIUM SULFATE 2 GM/50ML IV SOLN
2.0000 g | Freq: Once | INTRAVENOUS | Status: AC
  Administered 2019-08-04: 2 g via INTRAVENOUS
  Filled 2019-08-04: qty 50

## 2019-08-04 MED ORDER — POTASSIUM CHLORIDE CRYS ER 20 MEQ PO TBCR
40.0000 meq | EXTENDED_RELEASE_TABLET | ORAL | Status: AC
  Administered 2019-08-04 (×2): 40 meq via ORAL
  Filled 2019-08-04 (×2): qty 2

## 2019-08-04 MED ORDER — GADOBUTROL 1 MMOL/ML IV SOLN
10.0000 mL | Freq: Once | INTRAVENOUS | Status: AC | PRN
  Administered 2019-08-04: 10 mL via INTRAVENOUS

## 2019-08-04 MED ORDER — OXYCODONE HCL 5 MG PO TABS
5.0000 mg | ORAL_TABLET | Freq: Four times a day (QID) | ORAL | Status: DC | PRN
  Administered 2019-08-04 – 2019-08-10 (×16): 5 mg via ORAL
  Filled 2019-08-04 (×18): qty 1

## 2019-08-04 NOTE — Telephone Encounter (Signed)
[name] called advised Patient is still in the hospital on the 4th floor at [facility]. [name] said patient is very sick. [name] asked if Dr Louanne Skye go see [name]. [name] said she would feel so much better if Dr Louanne Skye would go see him. [name] said [name] has spinal meningitis and keep asking for Dr. Louanne Skye to come see him. The number to contact [name] is [phone]

## 2019-08-04 NOTE — Progress Notes (Signed)
Pharmacy Antibiotic Note  [name] is a 54 y.o. male admitted on 07/25/2019 with altered mental status. Patient placed on Acyclovir, Vancomycin, Ampicillin, and Ceftriaxone for meningitis. Antibiotics discontinued earlier today. Resumed this   at recommendation of Neurology. Plan is to complete 14 days of antibiotics   Plan:  Continue Acyclovir 900mg  IV q8h (~ 10 mg/kg/dose)  Continue  Ceftriaxone   2g IV q12h   Continue Ampicillin adjusted to 2g IV q4h  Continue Vancomycin 1500mg  IV q12h    Monitor clinical course, renal function, cultures as available   Height: 5\' 10"  (177.8 cm) Weight: 94.3 kg (207 lb 14.3 oz) IBW/kg (Calculated) : 73  Temp (24hrs), Avg:98.2 F (36.8 C), Min:98.1 F (36.7 C), Max:98.3 F (36.8 C)  Recent Labs  Lab 07/29/19 0240 07/29/19 0240 07/31/19 0321 07/31/19 2129 08/01/19 0212 08/02/19 0448 08/03/19 0306 08/04/19 0424  WBC 12.7*  --  9.4  --  10.4  --  9.3 11.8*  CREATININE 0.64   < > 0.67  --  0.83 0.70 0.59* 0.69  VANCOTROUGH  --   --   --  16  --   --   --   --    < > = values in this interval not displayed.    Estimated Creatinine Clearance: 123.1 mL/min (by C-G formula based on SCr of 0.69 mg/dL).    No Known Allergies  Antimicrobials this admission: 4/28 Ceftriaxone x 1, 4/30 >> 4/28 Doxycycline >> 4/28 4/29 Ampicillin/sulbactam >> 4/30 4/30 Ampicillin>> 5/3 AM, resumed 5/3 PM >> 4/30 Acyclovir >> 4/30 Vancomycin >>    Microbiology results: 4/27 Influenza A/B: neg, Covid: neg 4/27 RPR: nonreactive 4/27 HIV: nonreactive 4/27 BCx: ngF 4/27 UCx: ngF  4/29 MRSA PCR: neg 5/2 CSF: gram stain neg,  ngF 5/2 CSF fungal: ngtd 5/2 CSF HSV: ip 5/2 CSF VDRL: ip   Thank you for allowing pharmacy to be a part of this patient's care.    Royetta Asal, PharmD, BCPS 08/04/2019 8:46 AM

## 2019-08-04 NOTE — Consult Note (Addendum)
[facility]  Total days of antibiotics 10               Reason for Consult: discitis  Referring Physician: Cyndia Skeeters  Active Problems:   Nicotine dependence, cigarettes, uncomplicated   Toxic metabolic encephalopathy   Polysubstance overdose   Acute kidney injury ([place])   Chronic pain syndrome   SIRS (systemic inflammatory response syndrome) (HCC)   Delirium   Altered mental status    HPI: [name] is a 54 y.o. male with hx of stroke, previous lumbar laminectomy/fusion, htn, initial AMS thought to have meningitis was initially treated with amp, vanco, and acyclovir;  He underwent LP (CSF wbc 16, 87L, glu 46, and protein elevated at >600) on 5/2 after being on abtx for 6 days. . He has responded to abtx now afebrile. The patient now complains of low back pain. MRI imaging found to discitis- L1-2 => concerning discitis/osteomyelitis, with prominent anterior epidural phlegmon, but no large drainable collection. Patient only has low back pain but no lower extremity weakness. ID consultation for abtx recommendations.  Past Medical History:  Diagnosis Date  . Anxiety   . Arthritis   . Asthma   . Depression   . Duodenal adenoma   . GERD (gastroesophageal reflux disease)   . Hypertension   . Pneumonia    hx  . Post-traumatic osteoarthritis of left ankle   . Pre-diabetes   . Presence of right artificial knee joint 12/17/2016  . Schizophrenia ([place])   . Stroke [facility])    2009-or10    Allergies: No Known Allergies  MEDICATIONS: . benztropine  1 mg Oral Daily  . buPROPion  300 mg Oral Daily  . Chlorhexidine Gluconate Cloth  6 each Topical Daily  . Chlorhexidine Gluconate Cloth  6 each Topical Daily  . cyanocobalamin  1,000 mcg Subcutaneous Daily  . diltiazem  60 mg Oral Q6H  . enoxaparin (LOVENOX) injection  40 mg Subcutaneous Daily  . folic acid  1 mg Oral Daily  . gabapentin  300 mg Oral BID  . ibuprofen  800 mg Oral Once  . insulin aspart  0-5  Units Subcutaneous QHS  . insulin aspart  0-9 Units Subcutaneous TID WC  . losartan  50 mg Oral Daily  . pantoprazole  40 mg Oral Daily  . potassium chloride  40 mEq Oral Q4H  . thiamine  100 mg Oral Daily  . [START ON 08/07/2019] vitamin B-12  2,000 mcg Oral Daily    Social History   Tobacco Use  . Smoking status: Heavy Tobacco Smoker    Packs/day: 1.50    Years: 38.00    Pack years: 57.00    Types: Cigarettes  . Smokeless tobacco: Never Used  . Tobacco comment: onset age 23; upto 1.5 ppd  Substance Use Topics  . Alcohol use: Not Currently    Alcohol/week: 3.0 standard drinks    Types: 3 Cans of beer per week    Comment: quit 2015; formerly up to 2 fifths/day  . Drug use: No    Family History  Adopted: Yes  Problem Relation Age of Onset  . Colon cancer Neg Hx   . Esophageal cancer Neg Hx   . Rectal cancer Neg Hx   . Stomach cancer Neg Hx     Review of Systems  Constitutional: Negative for fever, chills, diaphoresis, activity change, appetite change, fatigue and unexpected weight change.  HENT: Negative for congestion, sore throat, rhinorrhea, sneezing, trouble swallowing  and sinus pressure.  Eyes: Negative for photophobia and visual disturbance.  Respiratory: Negative for cough, chest tightness, shortness of breath, wheezing and stridor.  Cardiovascular: Negative for chest pain, palpitations and leg swelling.  Gastrointestinal: Negative for nausea, vomiting, abdominal pain, diarrhea, constipation, blood in stool, abdominal distention and anal bleeding.  Genitourinary: Negative for dysuria, hematuria, flank pain and difficulty urinating.  Musculoskeletal: + back pain. Negative for myalgias, back pain, joint swelling, arthralgias and gait problem.  Skin: Negative for color change, pallor, rash and wound.  Neurological: Negative for dizziness, tremors, weakness and light-headedness.  Hematological: Negative for adenopathy. Does not bruise/bleed easily.   Psychiatric/Behavioral: Negative for behavioral problems, confusion, sleep disturbance, dysphoric mood, decreased concentration and agitation.     OBJECTIVE: Temp:  [98.1 F (36.7 C)-99 F (37.2 C)] 99 F (37.2 C) (05/07 1317) Pulse Rate:  [90-110] 90 (05/07 1317) Resp:  [18-20] 20 (05/07 1317) BP: (116-130)/(80-90) 124/88 (05/07 1317) SpO2:  [96 %-98 %] 96 % (05/07 1317) Physical Exam  Constitutional: He is oriented to person only. He appears well-developed and well-nourished. No distress.  HENT:  Mouth/Throat: Oropharynx is clear and moist. No oropharyngeal exudate.  Cardiovascular: Normal rate, regular rhythm and normal heart sounds. Exam reveals no gallop and no friction rub.  No murmur heard.  Pulmonary/Chest: Effort normal and breath sounds normal. No respiratory distress. He has no wheezes.  Abdominal: Soft. Bowel sounds are normal. He exhibits no distension. There is no tenderness.  Lymphadenopathy:  He has no cervical adenopathy.  Neurological: He is alert and oriented to person only. Skin: Skin is warm and dry. No rash noted. No erythema.  Psychiatric: He has a normal mood and affect. His behavior is normal.     LABS: Results for orders placed or performed during the hospital encounter of 07/25/19 (from the past 48 hour(s))  Glucose, capillary     Status: Abnormal   Collection Time: 08/02/19  4:15 PM  Result Value Ref Range   Glucose-Capillary 108 (H) 70 - 99 mg/dL    Comment: Glucose reference range applies only to samples taken after fasting for at least 8 hours.  Glucose, capillary     Status: Abnormal   Collection Time: 08/02/19  9:00 PM  Result Value Ref Range   Glucose-Capillary 107 (H) 70 - 99 mg/dL    Comment: Glucose reference range applies only to samples taken after fasting for at least 8 hours.  CBC     Status: Abnormal   Collection Time: 08/03/19  3:06 AM  Result Value Ref Range   WBC 9.3 4.0 - 10.5 K/uL   RBC 3.83 (L) 4.22 - 5.81 MIL/uL    Hemoglobin 9.1 (L) 13.0 - 17.0 g/dL   HCT 29.9 (L) 39.0 - 52.0 %   MCV 78.1 (L) 80.0 - 100.0 fL   MCH 23.8 (L) 26.0 - 34.0 pg   MCHC 30.4 30.0 - 36.0 g/dL   RDW 16.9 (H) 11.5 - 15.5 %   Platelets 475 (H) 150 - 400 K/uL   nRBC 0.0 0.0 - 0.2 %    Comment: Performed at [facility], [place] [address] [place], [place] [address]  Comprehensive metabolic panel     Status: Abnormal   Collection Time: 08/03/19  3:06 AM  Result Value Ref Range   Sodium 133 (L) 135 - 145 mmol/L   Potassium 3.7 3.5 - 5.1 mmol/L   Chloride 99 98 - 111 mmol/L   CO2 23 22 - 32 mmol/L   Glucose,  Bld 101 (H) 70 - 99 mg/dL    Comment: Glucose reference range applies only to samples taken after fasting for at least 8 hours.   BUN 5 (L) 6 - 20 mg/dL   Creatinine, Ser 0.59 (L) 0.61 - 1.24 mg/dL   Calcium 8.4 (L) 8.9 - 10.3 mg/dL   Total Protein 6.6 6.5 - 8.1 g/dL   Albumin 3.1 (L) 3.5 - 5.0 g/dL   AST 16 15 - 41 U/L   ALT 10 0 - 44 U/L   Alkaline Phosphatase 145 (H) 38 - 126 U/L   Total Bilirubin 0.4 0.3 - 1.2 mg/dL   GFR calc non Af Amer >60 >60 mL/min   GFR calc Af Amer >60 >60 mL/min   Anion gap 11 5 - 15    Comment: Performed at [facility], [place] [address] [place], [place] [address]  Magnesium     Status: None   Collection Time: 08/03/19  3:06 AM  Result Value Ref Range   Magnesium 1.7 1.7 - 2.4 mg/dL    Comment: Performed at [facility], [place] [address] [place], [place] [address]  Glucose, capillary     Status: Abnormal   Collection Time: 08/03/19  8:04 AM  Result Value Ref Range   Glucose-Capillary 137 (H) 70 - 99 mg/dL    Comment: Glucose reference range applies only to samples taken after fasting for at least 8 hours.  Glucose, capillary     Status: None   Collection Time: 08/03/19 12:05 PM  Result Value Ref Range   Glucose-Capillary 94 70 - 99 mg/dL    Comment: Glucose reference range applies only to samples taken after fasting for at least 8  hours.  Glucose, capillary     Status: None   Collection Time: 08/03/19  4:37 PM  Result Value Ref Range   Glucose-Capillary 96 70 - 99 mg/dL    Comment: Glucose reference range applies only to samples taken after fasting for at least 8 hours.  Glucose, capillary     Status: Abnormal   Collection Time: 08/03/19  9:21 PM  Result Value Ref Range   Glucose-Capillary 130 (H) 70 - 99 mg/dL    Comment: Glucose reference range applies only to samples taken after fasting for at least 8 hours.  CBC     Status: Abnormal   Collection Time: 08/04/19  4:24 AM  Result Value Ref Range   WBC 11.8 (H) 4.0 - 10.5 K/uL   RBC 3.83 (L) 4.22 - 5.81 MIL/uL   Hemoglobin 9.3 (L) 13.0 - 17.0 g/dL   HCT 29.9 (L) 39.0 - 52.0 %   MCV 78.1 (L) 80.0 - 100.0 fL   MCH 24.3 (L) 26.0 - 34.0 pg   MCHC 31.1 30.0 - 36.0 g/dL   RDW 16.9 (H) 11.5 - 15.5 %   Platelets 489 (H) 150 - 400 K/uL   nRBC 0.0 0.0 - 0.2 %    Comment: Performed at [facility], [place] [address] [place], [place] [address]  Comprehensive metabolic panel     Status: Abnormal   Collection Time: 08/04/19  4:24 AM  Result Value Ref Range   Sodium 131 (L) 135 - 145 mmol/L   Potassium 3.2 (L) 3.5 - 5.1 mmol/L   Chloride 97 (L) 98 - 111 mmol/L   CO2 24 22 - 32 mmol/L   Glucose, Bld 131 (H) 70 - 99 mg/dL    Comment: Glucose reference range applies only to samples  taken after fasting for at least 8 hours.   BUN 7 6 - 20 mg/dL   Creatinine, Ser 0.69 0.61 - 1.24 mg/dL   Calcium 8.6 (L) 8.9 - 10.3 mg/dL   Total Protein 6.7 6.5 - 8.1 g/dL   Albumin 3.1 (L) 3.5 - 5.0 g/dL   AST 16 15 - 41 U/L   ALT 10 0 - 44 U/L   Alkaline Phosphatase 147 (H) 38 - 126 U/L   Total Bilirubin 0.3 0.3 - 1.2 mg/dL   GFR calc non Af Amer >60 >60 mL/min   GFR calc Af Amer >60 >60 mL/min   Anion gap 10 5 - 15    Comment: Performed at [facility], [place] [address] [place], [place] [address]  Magnesium     Status: Abnormal   Collection Time:  08/04/19  4:24 AM  Result Value Ref Range   Magnesium 1.6 (L) 1.7 - 2.4 mg/dL    Comment: Performed at [facility], [place] [address] [place], [place] [address]  Glucose, capillary     Status: Abnormal   Collection Time: 08/04/19 12:21 PM  Result Value Ref Range   Glucose-Capillary 136 (H) 70 - 99 mg/dL    Comment: Glucose reference range applies only to samples taken after fasting for at least 8 hours.    MICRO: reviewed IMAGING: CT LUMBAR SPINE W CONTRAST  Result Date: 08/03/2019 CLINICAL DATA:  Low back pain EXAM: CT LUMBAR SPINE WITH CONTRAST TECHNIQUE: Multidetector CT imaging of the lumbar spine was performed with intravenous contrast administration. CONTRAST:  179m OMNIPAQUE IOHEXOL 300 MG/ML  SOLN COMPARISON:  Radiographs 08/01/2019, MR 09/21/2018 FINDINGS: Segmentation: Transitional lumbosacral segment assigned L5 as on prior studies Alignment: Normal Vertebrae: Patchy areas of cortical loss in the superior endplate and adjacent anterior body of L2,, and the inferior endplate and adjacent anterior body of L1. Paraspinal and other soft tissues: Inflammatory/edematous changes lateral and anterior to the L1-2 interspace with ill-defined enhancement. Subtle hyperdensity posterior to the L1-2 interspace, with some image degradation related to body habitus, cannot entirely exclude epidural abscess or phlegmon. Disc levels: T11-12: Unremarkable T12-L1: Anterior endplate spurring. L1-2: There is not significant narrowing of the interspace but bony adjacent changes and paravertebral changes as detailed above L2-3: Possible mild disc bulge versus anterior epidural process, incompletely characterized. L3-4: Anterior posterior endplate spurring. Previous left decompressive laminotomy. L4-5: Bilateral pedicle screws with vertical interconnecting hardware, intact without surrounding lucency. Cages and bone graft in the interspace without convincing bone bridging. L5-S1: Unremarkable. L5  transitional left transverse process with fusion to the sacrum. IMPRESSION: 1. Suspected discitis/osteomyelitis L1-2 with paraspinal phlegmon. Consider MR lumbar spine with contrast to exclude epidural abscess. 2. Postop changes L3-L5. Electronically Signed   By: DLucrezia EuropeM.D.   On: 08/03/2019 15:30   MR Lumbar Spine W Wo Contrast  Result Date: 08/04/2019 CLINICAL DATA:  Low back pain. EXAM: MRI LUMBAR SPINE WITHOUT AND WITH CONTRAST TECHNIQUE: Multiplanar and multiecho pulse sequences of the lumbar spine were obtained without and with intravenous contrast. CONTRAST:  141mGADAVIST GADOBUTROL 1 MMOL/ML IV SOLN COMPARISON:  CT of the lumbar spine performed on Aug 03, 2019 FINDINGS: The study is severely degraded by motion. Axial images are essentially nondiagnostic. Segmentation:  Standard. Alignment: Small retrolisthesis of L1 over L2 and L3 over L4 and small anterolisthesis of L4 over L5. Vertebrae: Prominent marrow edema and contrast enhancement of the L1 and L2 vertebral bodies extending into the corresponding pedicles is seen  with associated minimal endplate irregularity and associated increased T2 signal of the corresponding disc, concerning for discitis/osteomyelitis. There is a prominent anterior epidural phlegmon along the posterior wall of the L1 and L2 vertebral bodies measuring up to 8 mm in thickness. Two non enhancing focus are noted at the L1 and L2 levels measuring approximately 2 mm in thickness, may represent tiny abscesses. No large drainable collection identified. Postsurgical changes from interbody fusion, posterior transpedicular fixation and laminectomy at L4-5. Conus medullaris and cauda equina: Limited evaluation due to motion; grossly unremarkable. Paraspinal and other soft tissues: Edema and contrast enhancement is seen in the psoas muscle bilaterally and paraspinal musculature at the L1-2 level. Disc levels: T12-L1: No spinal canal or neural foraminal stenosis. L1-2: Moderate thecal  sac compression related to the phlegmon described above with T1 hypointense/enhancing tissue extending into the bilateral neural foramen. L2-3: Disc bulge resulting in mild bilateral neural foraminal narrowing. Mild narrowing of the thecal sac related to the phlegmon that disc space level. L3-4: Disc bulge resulting in mild spinal canal stenosis and mild bilateral neural foraminal narrowing. L4-5: Disc bulge with superimposed left foraminal disc protrusion resulting in moderate left neural foraminal narrowing. The no spinal canal stenosis. L5-S1: No spinal canal or neural foraminal stenosis. IMPRESSION: 1. Severely motion degraded study. Findings concerning for discitis/osteomyelitis at L1-L2 with prominent anterior epidural phlegmon measuring up to 8 mm in thickness with moderate thecal sac compression. No large drainable collection is identified. 2. Bilateral psoas and paraspinal muscle edema and contrast enhancement at the L1-2 level. 3. Degenerative changes with moderate left neural foraminal narrowing at L4-5. These results will be called to the ordering clinician or representative by the Radiologist Assistant, and communication documented in the PACS or [employer]. Electronically Signed   By: Pedro Earls M.D.   On: 08/04/2019 10:12    Assessment/Plan:  54yo M initially admitted for fever, and AMS thought to be due to meningoencephalitis with LP showing small pleocytosis with elevated protein. Work up revealed discitis with anterior epidural phlegmon  Culture negative discitis = he has been on 11 days of IV abtx, and unlikely to have + cultures if we did disc aspirate. Plan on treating empirically with vancomycin plus ceftriaxone for 6 wks. Needs 31 more days of treatment  AMS = initially thought to be meningitis, with wbc slightly elevated but markedly elevated protein concern for bacterial meningitis. Question whether the abn CSF was secondary to epidural abscess. Would  recommend to continue with neuro checks, he didn't follow commands as much since he quickly fell asleep during exam. He was able to move lower extremities with sensation intact during my exam. If neuro exam changes, please consult neurosurgery  Hx of schizophrenia, altered mental status = unclear if this is his baseline since my first visit with him. He is likely to need SNF to complete course of therapy   Please get picc line Will need opat for vancomycin and ceftriaxone to complete course picc line to be removed once finishes course Will see back in ID clinic in 4-6wk ------------------------------------------- Diagnosis: discitis  Culture Result: negative  No Known Allergies  OPAT Orders Discharge antibiotics to be given via PICC line Discharge antibiotics: Per pharmacy protocol  Vancomycin plus ceftriaxone 2gm IV daily Aim for Vancomycin trough 15-20 or AUC 400-550 (unless otherwise indicated) Duration: 6 wk including hospitalization End Date: june 8th  [facility] Per Protocol:  Home health RN for IV administration and teaching; PICC line care and labs.  Labs weekly while on IV antibiotics: _x_ CBC with differential __ BMP __ CMP __ CRP __ ESR _x_ Vancomycin trough __ CK  x__ Please pull PIC at completion of IV antibiotics   Fax weekly labs to [phone]  Clinic Follow Up Appt: 4 wk  @ [facility] with dr Braxten Memmer  Will sign off.

## 2019-08-04 NOTE — Progress Notes (Signed)
PT Cancellation Note  Patient Details Name: [name] MRN: [id number] DOB: [birth year]   Cancelled Treatment:    Reason Eval/Treat Not Completed: Patient declined, no reason specified. Pt asleep upon arrival, but awakens to therapist's voice. Pt declines therapy at this time due to "my legs are so sore" and wanting to continue sleeping.    Talbot Grumbling PT, DPT 08/04/19, 2:57 PM

## 2019-08-04 NOTE — Progress Notes (Signed)
PROGRESS NOTE  [name] NUU:[id number] DOB: [birth year]   PCP: [name], MD  Patient is from: Home.  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  Per patient's mother, he recently quit drinking but may take others pain meds.  DOA: 07/25/2019 LOS: 9  Brief Narrative / Interim history: 54 year old gentleman with past medical history remarkable for HTN, CVA, GERD, depression/anxiety, chronic pain syndrome, EtOH dependence,history of polysubstance abuse including alcohol, PCP, heroin, cocaine who was brought in by EMS after being found on the sidewalk with increased heart rate and respiratory rate.  Admitted for polysubstance abuse, encephalopathy.  Initially requiring Precedex drip went to the ICU for agitation. Due to delay in LP, neurology recommending recommended empiric treatment with 14 days of IV antibiotics for meningitis.  However, ID suggested shortening the antibiotic course if he clinically improves and transition to p.o. Patient underwent LP on 5/2. CSF fluid was unrevealing.  Patient had CT lumbar spine on 5/6 for lower back pain concerning for discitis/osteomyelitis. MRI of lumbar spine revealed possible discitis/osteomyelitis at L1-L2 with prominent anterior epidural phlegmon measuring up to 8 mm in thickness with moderate thecal sac compression but no large drainable collection. Infectious disease formally consulted on 5/7.   Subjective: Seen and examined earlier this morning. No major events overnight or this morning. Patient is somewhat sleepy but awakes to voice. Not a reliable historian. He is only oriented to self and his residential address. Follows some commands.  Objective: Vitals:   08/03/19 1331 08/03/19 2116 08/04/19 0559 08/04/19 1317  BP: 130/90 116/80 128/89 124/88  Pulse: (!) 110 (!) 104 (!) 108 90  Resp: 18 20  20   Temp: 98.1 F (36.7 C) 98.3 F (36.8 C) 98.3 F (36.8 C) 99 F (37.2 C)  TempSrc: Oral  Oral Oral Oral  SpO2: 98% 96% 96% 96%  Weight:      Height:        Intake/Output Summary (Last 24 hours) at 08/04/2019 1332 Last data filed at 08/04/2019 1322 Gross per 24 hour  Intake 980 ml  Output 1350 ml  Net -370 ml   Filed Weights   07/26/19 1858  Weight: 94.3 kg    Examination:  GENERAL: Sleepy but wakes to voice easily. No apparent distress. HEENT: MMM.  Vision and hearing grossly intact.  NECK: Supple.  No apparent JVD. No nuchal rigidity. RESP: On room air. No IWOB.  Fair aeration bilaterally. CVS:  RRR. Heart sounds normal.  ABD/GI/GU: Bowel sounds present. Soft. Non tender.  MSK/EXT:  Moves extremities. No apparent deformity. No edema.  SKIN: no apparent skin lesion or wound NEURO: Sleepy but wakes to voice easily. Oriented to self and partial place. Follows some commands. No apparent focal neuro deficit. PSYCH: Calm. Normal affect.  Procedures:  5/2-lumbar puncture  Microbiology summarized: 4/27-influenza PCR and COVID-19 PCR negative. 4/27-catheterized urine culture negative. 4/29-MRSA PCR screen negative. 4/27-blood cultures negative x2. 5/2-CSF cultures negative.  Assessment & Plan: Acute metabolic encephalopathy: Multifactorial including polysubstance, possible meningitis, delirium and osteomyelitis. UDS positive for amphetamine, opiates and benzo. MRI brain, EEG, CSF study, ammonia, andTSH unrevealing. B12 low. B1 pending. Seems to be improving. -Frequent reorientation, fall and delirium precautions. -Treat treatable causes as below.  Vitamin B12 deficiency: B12 147. -Subcu vitamin B12 1000 mcg subcu x1 week followed by 2000 mcg p.o. daily -Repeat B12 level 1 month  Bacterial meningitis? CSF study, cultures and MRI brain unrevealing but CSF samples obtained about 5 days  after IV antibiotic initiation.  -Neuro-14 days of empiric IV ampicillin, acyclovir, vancomycin and Rocephin for 14 days.  -ID, Dr Hatcher-consider switching to high-dose Augmentin  (1000 mg twice daily) for 7 more days with outpatient follow-up if symptoms continue to improve and cultures remain neg -HSV PCR negative. Discontinued acyclovir on 5/7  L1-L2 discitis/osteomyelitis with epidural phlegmon but no drainable fluid-no red flags on exam but limited due to mental status.  Has history of lumbar spinal fusion at L4-5 in 03/2019 -Continue current antibiotics as above -ID consulted and will see patient  Polysubstance abuse with EtOH, amphetamine, opiates,benzodiazepine Hx PCP,cocaine, heroin abuse -Off Precedex drip.   -Continue folic acid, thiamine-increase thiamine to 250 mg daily  Essential hypertension: Normotensive Sinus tachycardia-improved. -Continue p.o. Cardizem and losartan  Chronic pain syndrome -Gabapentin twice daily, as needed Percocet  GERD:  -continue PPI  Depression/anxiety: -Continue Wellbutrin and as needed Ativan  Generalized weakness: -PT/OT eval            DVT prophylaxis: Subcu Lovenox Code Status: Full code Family Communication: Updated patient's mother over the phone. Status is: Inpatient  Remains inpatient appropriate because:Altered mental status, Ongoing diagnostic testing needed not appropriate for outpatient work up, IV treatments appropriate due to intensity of illness or inability to take PO and Inpatient level of care appropriate due to severity of illness   Dispo: The patient is from: Home              Anticipated d/c is to: SNF              Anticipated d/c date is: 3 days              Patient currently is not medically stable to d/c.        Consultants:  Infectious disease   Sch Meds:  Scheduled Meds: . benztropine  1 mg Oral Daily  . buPROPion  300 mg Oral Daily  . Chlorhexidine Gluconate Cloth  6 each Topical Daily  . Chlorhexidine Gluconate Cloth  6 each Topical Daily  . cyanocobalamin  1,000 mcg Subcutaneous Daily  . diltiazem  60 mg Oral Q6H  . enoxaparin (LOVENOX) injection  40 mg  Subcutaneous Daily  . folic acid  1 mg Oral Daily  . gabapentin  300 mg Oral BID  . ibuprofen  800 mg Oral Once  . insulin aspart  0-5 Units Subcutaneous QHS  . insulin aspart  0-9 Units Subcutaneous TID WC  . losartan  50 mg Oral Daily  . pantoprazole  40 mg Oral Daily  . potassium chloride  40 mEq Oral Q4H  . [START ON 08/05/2019] thiamine  250 mg Oral Daily  . [START ON 08/07/2019] vitamin B-12  2,000 mcg Oral Daily   Continuous Infusions: . sodium chloride Stopped (07/28/19 1838)  . ampicillin (OMNIPEN) IV 2 g (08/04/19 1228)  . cefTRIAXone (ROCEPHIN)  IV 2 g (08/04/19 1610)  . lactated ringers 50 mL/hr at 08/03/19 1839  . vancomycin 1,500 mg (08/04/19 1211)   PRN Meds:.sodium chloride, acetaminophen **OR** acetaminophen, albuterol, HYDROmorphone (DILAUDID) injection, labetalol, LORazepam, metoprolol tartrate, ondansetron **OR** ondansetron (ZOFRAN) IV, oxyCODONE-acetaminophen, sodium chloride flush  Antimicrobials: Anti-infectives (From admission, onward)   Start     Dose/Rate Route Frequency Ordered Stop   07/31/19 2200  vancomycin (VANCOREADY) IVPB 1500 mg/300 mL     1,500 mg 150 mL/hr over 120 Minutes Intravenous Every 12 hours 07/31/19 1858     07/31/19 1800  ampicillin (OMNIPEN) 1 g in sodium chloride 0.9 %  100 mL IVPB  Status:  Discontinued     1 g 300 mL/hr over 20 Minutes Intravenous Every 6 hours 07/31/19 1719 07/31/19 1722   07/31/19 1800  cefTRIAXone (ROCEPHIN) 2 g in sodium chloride 0.9 % 100 mL IVPB     2 g 200 mL/hr over 30 Minutes Intravenous Every 12 hours 07/31/19 1720     07/31/19 1800  ampicillin (OMNIPEN) 2 g in sodium chloride 0.9 % 100 mL IVPB     2 g 300 mL/hr over 20 Minutes Intravenous Every 4 hours 07/31/19 1722     07/31/19 1730  cefTRIAXone (ROCEPHIN) 2 g in sodium chloride 0.9 % 100 mL IVPB  Status:  Discontinued     2 g 200 mL/hr over 30 Minutes Intravenous Every 24 hours 07/31/19 1716 07/31/19 1720   07/29/19 1000  vancomycin (VANCOREADY) IVPB  1500 mg/300 mL  Status:  Discontinued     1,500 mg 150 mL/hr over 120 Minutes Intravenous Every 12 hours 07/28/19 2055 07/31/19 1201   07/28/19 2200  ampicillin (OMNIPEN) 2 g in sodium chloride 0.9 % 100 mL IVPB  Status:  Discontinued     2 g 300 mL/hr over 20 Minutes Intravenous Every 4 hours 07/28/19 1808 07/31/19 1205   07/28/19 2000  vancomycin (VANCOREADY) IVPB 2000 mg/400 mL     2,000 mg 200 mL/hr over 120 Minutes Intravenous  Once 07/28/19 1759 07/28/19 2258   07/28/19 2000  acyclovir (ZOVIRAX) 900 mg in dextrose 5 % 150 mL IVPB  Status:  Discontinued     900 mg 168 mL/hr over 60 Minutes Intravenous Every 8 hours 07/28/19 1805 08/04/19 1327   07/28/19 1830  ampicillin (OMNIPEN) 1 g in sodium chloride 0.9 % 100 mL IVPB  Status:  Discontinued     1 g 300 mL/hr over 20 Minutes Intravenous Every 6 hours 07/28/19 1752 07/28/19 1808   07/28/19 1815  ampicillin (OMNIPEN) 2 g in sodium chloride 0.9 % 100 mL IVPB     2 g 300 mL/hr over 20 Minutes Intravenous  Once 07/28/19 1808 07/28/19 1935   07/28/19 1800  cefTRIAXone (ROCEPHIN) 2 g in sodium chloride 0.9 % 100 mL IVPB  Status:  Discontinued     2 g 200 mL/hr over 30 Minutes Intravenous Every 12 hours 07/28/19 1752 07/31/19 1205   07/27/19 1100  Ampicillin-Sulbactam (UNASYN) 3 g in sodium chloride 0.9 % 100 mL IVPB  Status:  Discontinued     3 g 200 mL/hr over 30 Minutes Intravenous Every 6 hours 07/27/19 1001 07/28/19 1157   07/26/19 0300  doxycycline (VIBRAMYCIN) 100 mg in sodium chloride 0.9 % 250 mL IVPB  Status:  Discontinued     100 mg 125 mL/hr over 120 Minutes Intravenous Every 12 hours 07/26/19 0209 07/26/19 1001   07/25/19 2345  cefTRIAXone (ROCEPHIN) 1 g in sodium chloride 0.9 % 100 mL IVPB     1 g 200 mL/hr over 30 Minutes Intravenous  Once 07/25/19 2343 07/26/19 0141       I have personally reviewed the following labs and images: CBC: Recent Labs  Lab 07/29/19 0240 07/31/19 0321 08/01/19 0212 08/03/19 0306  08/04/19 0424  WBC 12.7* 9.4 10.4 9.3 11.8*  NEUTROABS  --  7.1  --   --   --   HGB 10.2* 10.2* 9.3* 9.1* 9.3*  HCT 33.5* 34.3* 31.1* 29.9* 29.9*  MCV 78.1* 79.4* 78.9* 78.1* 78.1*  PLT 520* 511* 487* 475* 489*   BMP &GFR Recent Labs  Lab  07/31/19 0321 08/01/19 1610 08/02/19 0448 08/03/19 0306 08/04/19 0424  NA 136 133* 135 133* 131*  K 2.8* 3.1* 3.1* 3.7 3.2*  CL 102 104 102 99 97*  CO2 22 20* 23 23 24   GLUCOSE 112* 131* 114* 101* 131*  BUN 8 9 6  5* 7  CREATININE 0.67 0.83 0.70 0.59* 0.69  CALCIUM 8.5* 8.2* 8.6* 8.4* 8.6*  MG  --   --  1.7 1.7 1.6*   Estimated Creatinine Clearance: 123.1 mL/min (by C-G formula based on SCr of 0.69 mg/dL). Liver & Pancreas: Recent Labs  Lab 08/01/19 0212 08/03/19 0306 08/04/19 0424  AST 22 16 16   ALT 13 10 10   ALKPHOS 158* 145* 147*  BILITOT 0.4 0.4 0.3  PROT 6.6 6.6 6.7  ALBUMIN 2.9* 3.1* 3.1*   No results for input(s): LIPASE, AMYLASE in the last 168 hours. No results for input(s): AMMONIA in the last 168 hours. Diabetic: No results for input(s): HGBA1C in the last 72 hours. Recent Labs  Lab 08/03/19 0804 08/03/19 1205 08/03/19 1637 08/03/19 2121 08/04/19 1221  GLUCAP 137* 94 96 130* 136*   Cardiac Enzymes: No results for input(s): CKTOTAL, CKMB, CKMBINDEX, TROPONINI in the last 168 hours. No results for input(s): PROBNP in the last 8760 hours. Coagulation Profile: No results for input(s): INR, PROTIME in the last 168 hours. Thyroid Function Tests: No results for input(s): TSH, T4TOTAL, FREET4, T3FREE, THYROIDAB in the last 72 hours. Lipid Profile: No results for input(s): CHOL, HDL, LDLCALC, TRIG, CHOLHDL, LDLDIRECT in the last 72 hours. Anemia Panel: No results for input(s): VITAMINB12, FOLATE, FERRITIN, TIBC, IRON, RETICCTPCT in the last 72 hours. Urine analysis:    Component Value Date/Time   COLORURINE YELLOW 07/25/2019 1805   APPEARANCEUR HAZY (A) 07/25/2019 1805   LABSPEC 1.011 07/25/2019 1805   PHURINE  6.0 07/25/2019 1805   GLUCOSEU NEGATIVE 07/25/2019 1805   HGBUR SMALL (A) 07/25/2019 1805   BILIRUBINUR NEGATIVE 07/25/2019 1805   BILIRUBINUR negative 04/10/2016 1702   KETONESUR NEGATIVE 07/25/2019 1805   PROTEINUR 30 (A) 07/25/2019 1805   UROBILINOGEN 0.2 04/10/2016 1702   UROBILINOGEN 0.2 12/04/2013 1044   NITRITE NEGATIVE 07/25/2019 1805   LEUKOCYTESUR SMALL (A) 07/25/2019 1805   Sepsis Labs: Invalid input(s): PROCALCITONIN, LACTICIDVEN  Microbiology: Recent Results (from the past 240 hour(s))  Urine culture     Status: None   Collection Time: 07/25/19  6:05 PM   Specimen: Urine, Catheterized  Result Value Ref Range Status   Specimen Description   Final    URINE, CATHETERIZED Performed at [facility], [address], [place], [place] [address]    Special Requests   Final    NONE Performed at [facility], [address], [place], [place] [address]    Culture   Final    NO GROWTH Performed at [facility], [address] [place], [place] [address]    Report Status 07/26/2019 FINAL  Final  Respiratory Panel by RT PCR (Flu A&B, Covid) - Nasopharyngeal Swab     Status: None   Collection Time: 07/25/19 11:07 PM   Specimen: Nasopharyngeal Swab  Result Value Ref Range Status   SARS Coronavirus 2 by RT PCR NEGATIVE NEGATIVE Final    Comment: (NOTE) SARS-CoV-2 target nucleic acids are NOT DETECTED. The SARS-CoV-2 RNA is generally detectable in upper respiratoy specimens during the acute phase of infection. The lowest concentration of SARS-CoV-2 viral copies this assay can detect is 131 copies/mL. A negative result  does not preclude SARS-Cov-2 infection and should not be used as the sole basis for treatment or other patient management decisions. A negative result may occur with  improper specimen collection/handling, submission of specimen other than nasopharyngeal swab, presence of viral mutation(s) within the areas targeted  by this assay, and inadequate number of viral copies (<131 copies/mL). A negative result must be combined with clinical observations, patient history, and epidemiological information. The expected result is Negative. Fact Sheet for Patients:  https://www.moore.com/ Fact Sheet for Healthcare Providers:  https://www.young.biz/ This test is not yet ap proved or cleared by the [place] FDA and  has been authorized for detection and/or diagnosis of SARS-CoV-2 by FDA under an Emergency Use Authorization (EUA). This EUA will remain  in effect (meaning this test can be used) for the duration of the COVID-19 declaration under Section 564(b)(1) of the Act, 21 U.S.C. section 360bbb-3(b)(1), unless the authorization is terminated or revoked sooner.    Influenza A by PCR NEGATIVE NEGATIVE Final   Influenza B by PCR NEGATIVE NEGATIVE Final    Comment: (NOTE) The Xpert Xpress SARS-CoV-2/FLU/RSV assay is intended as an aid in  the diagnosis of influenza from Nasopharyngeal swab specimens and  should not be used as a sole basis for treatment. Nasal washings and  aspirates are unacceptable for Xpert Xpress SARS-CoV-2/FLU/RSV  testing. Fact Sheet for Patients: https://www.moore.com/ Fact Sheet for Healthcare Providers: https://www.young.biz/ This test is not yet approved or cleared by the [place] FDA and  has been authorized for detection and/or diagnosis of SARS-CoV-2 by  FDA under an Emergency Use Authorization (EUA). This EUA will remain  in effect (meaning this test can be used) for the duration of the  Covid-19 declaration under Section 564(b)(1) of the Act, 21  U.S.C. section 360bbb-3(b)(1), unless the authorization is  terminated or revoked. Performed at [facility], [address], [place], [place] [address]   Culture, blood (routine x 2)     Status: None   Collection Time: 07/25/19  11:45 PM   Specimen: BLOOD  Result Value Ref Range Status   Specimen Description   Final    BLOOD RIGHT ANTECUBITAL Performed at [facility], [address] [place], [place] [address]    Special Requests   Final    BOTTLES DRAWN AEROBIC AND ANAEROBIC Blood Culture adequate volume Performed at [facility], [address] [place], [place] [address]    Culture   Final    NO GROWTH 5 DAYS Performed at [facility], [address], [place], [place] [address]    Report Status 07/31/2019 FINAL  Final  Culture, blood (routine x 2)     Status: None   Collection Time: 07/25/19 11:50 PM   Specimen: BLOOD  Result Value Ref Range Status   Specimen Description   Final    BLOOD RIGHT HAND Performed at [facility], [address], [place], [place] [address]    Special Requests   Final    BOTTLES DRAWN AEROBIC AND ANAEROBIC Blood Culture results may not be optimal due to an excessive volume of blood received in culture bottles Performed at [facility], [address], [place], [place] [address]    Culture   Final    NO GROWTH 5 DAYS Performed at [facility], [address], [place], [place] [address]    Report Status 07/31/2019 FINAL  Final  MRSA PCR Screening     Status:  None   Collection Time: 07/27/19  2:42 PM   Specimen: Nasopharyngeal  Result Value Ref Range Status   MRSA by PCR NEGATIVE NEGATIVE Final    Comment:        The GeneXpert MRSA Assay (FDA approved for NASAL specimens only), is one component of a comprehensive MRSA colonization surveillance program. It is not intended to diagnose MRSA infection nor to guide or monitor treatment for MRSA infections. Performed at [facility], [address], [place], [place] [address]   CSF culture     Status: None   Collection Time: 07/30/19 12:35 PM   Specimen: PATH Cytology CSF; Cerebrospinal Fluid  Result Value Ref  Range Status   Specimen Description   Final    CSF Performed at [facility], [address], [place], [place] [address]    Special Requests   Final    NONE Performed at [facility], [address], [place], [place] [address]    Gram Stain   Final    WBC PRESENT, PREDOMINANTLY PMN NO ORGANISMS SEEN CYTOSPIN SMEAR    Culture   Final    NO GROWTH 3 DAYS Performed at [facility], [address] [place], [place] [address]    Report Status 08/03/2019 FINAL  Final  Culture, fungus without smear     Status: None (Preliminary result)   Collection Time: 07/30/19 12:35 PM   Specimen: PATH Cytology CSF; Cerebrospinal Fluid  Result Value Ref Range Status   Specimen Description   Final    CSF Performed at [facility], [address], [place], [place] [address]    Special Requests   Final    NONE Performed at [facility], [address] [place], [place] [address]    Culture   Final    NO FUNGUS ISOLATED AFTER 5 DAYS Performed at [facility], [address], [place], [place] [address]    Report Status PENDING  Incomplete    Radiology Studies: MR Lumbar Spine W Wo Contrast  Result Date: 08/04/2019 CLINICAL DATA:  Low back pain. EXAM: MRI LUMBAR SPINE WITHOUT AND WITH CONTRAST TECHNIQUE: Multiplanar and multiecho pulse sequences of the lumbar spine were obtained without and with intravenous contrast. CONTRAST:  10mL GADAVIST GADOBUTROL 1 MMOL/ML IV SOLN COMPARISON:  CT of the lumbar spine performed on Aug 03, 2019 FINDINGS: The study is severely degraded by motion. Axial images are essentially nondiagnostic. Segmentation:  Standard. Alignment: Small retrolisthesis of L1 over L2 and L3 over L4 and small anterolisthesis of L4 over L5. Vertebrae: Prominent marrow edema and contrast enhancement of the L1 and L2 vertebral bodies extending into the corresponding pedicles is seen with associated minimal  endplate irregularity and associated increased T2 signal of the corresponding disc, concerning for discitis/osteomyelitis. There is a prominent anterior epidural phlegmon along the posterior wall of the L1 and L2 vertebral bodies measuring up to 8 mm in thickness. Two non enhancing focus are noted at the L1 and L2 levels measuring approximately 2 mm in thickness, may represent tiny abscesses. No large drainable collection identified. Postsurgical changes from interbody fusion, posterior transpedicular fixation and laminectomy at L4-5. Conus medullaris and cauda equina: Limited evaluation due to motion; grossly unremarkable. Paraspinal and other soft tissues: Edema and contrast enhancement is seen in the psoas muscle bilaterally and paraspinal musculature at the L1-2 level. Disc levels: T12-L1: No spinal canal or neural foraminal stenosis. L1-2: Moderate thecal sac compression related  to the phlegmon described above with T1 hypointense/enhancing tissue extending into the bilateral neural foramen. L2-3: Disc bulge resulting in mild bilateral neural foraminal narrowing. Mild narrowing of the thecal sac related to the phlegmon that disc space level. L3-4: Disc bulge resulting in mild spinal canal stenosis and mild bilateral neural foraminal narrowing. L4-5: Disc bulge with superimposed left foraminal disc protrusion resulting in moderate left neural foraminal narrowing. The no spinal canal stenosis. L5-S1: No spinal canal or neural foraminal stenosis. IMPRESSION: 1. Severely motion degraded study. Findings concerning for discitis/osteomyelitis at L1-L2 with prominent anterior epidural phlegmon measuring up to 8 mm in thickness with moderate thecal sac compression. No large drainable collection is identified. 2. Bilateral psoas and paraspinal muscle edema and contrast enhancement at the L1-2 level. 3. Degenerative changes with moderate left neural foraminal narrowing at L4-5. These results will be called to the ordering  clinician or representative by the Radiologist Assistant, and communication documented in the PACS or [employer]. Electronically Signed   By: Baldemar Lenis M.D.   On: 08/04/2019 10:12      Aydrian Halpin T. Yi Falletta [facility]  If 7PM-7AM, please contact night-coverage [email or web address] Password [email or web address] 08/04/2019, 1:32 PM

## 2019-08-04 NOTE — TOC Initial Note (Signed)
**Note Brian-Identified via Obfuscation** Transition of Care [facility]) - Initial/Assessment Note    Patient Details  Name: DEMARRION [name] MRN: [id number] Date of Birth: Jul 01, 1965  Transition of Care [facility]) CM/SW Contact:    Brian Clam, RN Phone Number: 08/04/2019, 11:22 AM  Clinical Narrative: Patient currently too confused to talk to-spoke to [facility] patient from home w/girlfriend-disabled-ACT team follows-spoke to [facility] can transport home @ d/c-contact them [phone] or crisis line [phone]. Has pcp,pharmacy.PT recc HHPT-will check on [facility] agency to accept.                  Expected Discharge Plan: Home w Home Health Services Barriers to Discharge: Continued Medical Work up   Patient Goals and CMS Choice Patient states their goals for this hospitalization and ongoing recovery are:: go home CMS Medicare.gov Compare Post Acute Care list provided to:: Patient Represenative (must comment)(Patients mother-Mary;ACT Team) Choice offered to / list presented to : Parent  Expected Discharge Plan and Services Expected Discharge Plan: Home w Home Health Services   Discharge Planning Services: CM Consult Post Acute Care Choice: Home Health Living arrangements for the past 2 months: Apartment                                      Prior Living Arrangements/Services Living arrangements for the past 2 months: Apartment Lives with:: Friends(girlfriend-disabled) Patient language and need for interpreter reviewed:: Yes Do you feel safe going back to the place where you live?: Yes      Need for Family Participation in Patient Care: No (Comment) Care giver support system in place?: Yes (comment) Current home services: DME(rw,cane) Criminal Activity/Legal Involvement Pertinent to Current Situation/Hospitalization: No - Comment as needed  Activities of Daily Living Home Assistive Devices/Equipment: Cane (specify quad or straight), Eyeglasses, Other (Comment)(single point cane, tub/shower unit) ADL  Screening (condition at time of admission) Patient's cognitive ability adequate to safely complete daily activities?: Yes Is the patient deaf or have difficulty hearing?: No Does the patient have difficulty seeing, even when wearing glasses/contacts?: No Does the patient have difficulty concentrating, remembering, or making decisions?: No Patient able to express need for assistance with ADLs?: Yes Does the patient have difficulty dressing or bathing?: No Independently performs ADLs?: Yes (appropriate for developmental age) Does the patient have difficulty walking or climbing stairs?: No Weakness of Legs: Both Weakness of Arms/Hands: None  Permission Sought/Granted Permission sought to share information with : Case Manager Permission granted to share information with : Yes, Verbal Permission Granted  Share Information with NAME: Case Manager  Permission granted to share info w AGENCY: ACT Team;HHC  Permission granted to share info w Relationship: [name] (mother) [phone]     Emotional Assessment Appearance:: Appears stated age Attitude/Demeanor/Rapport: Gracious Affect (typically observed): Unable to Assess Orientation: : Oriented to Self, Oriented to Place, Oriented to  Time, Oriented to Situation Alcohol / Substance Use: Illicit Drugs, Tobacco Use, Alcohol Use Psych Involvement: No (comment)  Admission diagnosis:  Thrush [B37.0] Delirium [R41.0] Polysubstance overdose [T50.901A] Acute kidney injury (HCC) [N17.9] Altered mental status, unspecified altered mental status type [R41.82] Patient Active Problem List   Diagnosis Date Noted  . Altered mental status   . Delirium   . Toxic metabolic encephalopathy 07/26/2019  . Polysubstance overdose 07/26/2019  . Acute kidney injury (HCC) 07/26/2019  . Chronic pain syndrome 07/26/2019  . SIRS (systemic inflammatory response syndrome) (HCC) 07/26/2019  .  Spinal stenosis, lumbar region, with neurogenic claudication  04/11/2019    Class: Chronic  . Lumbar disc herniation 04/11/2019    Class: Chronic  . Status post lumbar spinal fusion 04/11/2019  . Hardware complicating wound infection (HCC) 01/31/2018  . Spondylolisthesis of lumbar region 12/30/2017  . S/P ankle fusion 10/20/2017  . Post-traumatic osteoarthritis, left ankle and foot 01/07/2017  . Primary osteoarthritis of right knee 05/27/2016  . GERD (gastroesophageal reflux disease) 07/09/2015  . Mild persistent asthma 07/09/2015  . Type 2 diabetes mellitus (HCC) 07/09/2015  . Polyp of duodenum 06/04/2015  . Alcohol dependence (HCC) 02/25/2015  . Moderate episode of recurrent major depressive disorder (HCC) 02/25/2015  . Upper GI bleed 02/18/2015  . Benign essential HTN 02/18/2015  . Anemia of chronic disease 02/18/2015  . Pulmonary nodule 02/18/2015  . Cocaine abuse (HCC) 03/02/2014  . Posttraumatic stress disorder 03/02/2014  . History of CVA (cerebrovascular accident) 01/12/2014  . Schizophrenia (HCC) 01/12/2014  . Nicotine dependence, cigarettes, uncomplicated 12/06/2013   PCP:  [name], MD Pharmacy:   [facility] [phone] [place], [place] - [phone] AT [facility] [name] [place] [address] Phone: [phone] Fax: [phone]     Social Determinants of Health (SDOH) Interventions    Readmission Risk Interventions No flowsheet data found.

## 2019-08-05 ENCOUNTER — Inpatient Hospital Stay: Payer: Self-pay

## 2019-08-05 LAB — CBC
HCT: 31.9 % — ABNORMAL LOW (ref 39.0–52.0)
Hemoglobin: 9.8 g/dL — ABNORMAL LOW (ref 13.0–17.0)
MCH: 24.2 pg — ABNORMAL LOW (ref 26.0–34.0)
MCHC: 30.7 g/dL (ref 30.0–36.0)
MCV: 78.8 fL — ABNORMAL LOW (ref 80.0–100.0)
Platelets: 521 10*3/uL — ABNORMAL HIGH (ref 150–400)
RBC: 4.05 MIL/uL — ABNORMAL LOW (ref 4.22–5.81)
RDW: 17.4 % — ABNORMAL HIGH (ref 11.5–15.5)
WBC: 10.8 10*3/uL — ABNORMAL HIGH (ref 4.0–10.5)
nRBC: 0 % (ref 0.0–0.2)

## 2019-08-05 LAB — RENAL FUNCTION PANEL
Albumin: 3.3 g/dL — ABNORMAL LOW (ref 3.5–5.0)
Anion gap: 8 (ref 5–15)
BUN: 6 mg/dL (ref 6–20)
CO2: 25 mmol/L (ref 22–32)
Calcium: 8.7 mg/dL — ABNORMAL LOW (ref 8.9–10.3)
Chloride: 105 mmol/L (ref 98–111)
Creatinine, Ser: 0.63 mg/dL (ref 0.61–1.24)
GFR calc Af Amer: >60 mL/min (ref >60)
GFR calc non Af Amer: >60 mL/min (ref >60)
Glucose, Bld: 101 mg/dL — ABNORMAL HIGH (ref 70–99)
Phosphorus: 3.4 mg/dL (ref 2.5–4.6)
Potassium: 4.1 mmol/L (ref 3.5–5.1)
Sodium: 138 mmol/L (ref 135–145)

## 2019-08-05 LAB — GLUCOSE, CAPILLARY
Glucose-Capillary: 103 mg/dL — ABNORMAL HIGH (ref 70–99)
Glucose-Capillary: 106 mg/dL — ABNORMAL HIGH (ref 70–99)
Glucose-Capillary: 88 mg/dL (ref 70–99)
Glucose-Capillary: 108 mg/dL — ABNORMAL HIGH (ref 70–99)

## 2019-08-05 LAB — VITAMIN B1: Vitamin B1 (Thiamine): 351 nmol/L — ABNORMAL HIGH (ref 66.5–200.0)

## 2019-08-05 LAB — MAGNESIUM: Magnesium: 2.1 mg/dL (ref 1.7–2.4)

## 2019-08-05 MED ORDER — MUSCLE RUB 10-15 % EX CREA
TOPICAL_CREAM | CUTANEOUS | Status: DC | PRN
  Filled 2019-08-05 (×3): qty 85

## 2019-08-05 NOTE — Progress Notes (Signed)
PROGRESS NOTE  [name] VFI:[id number] DOB: 1965-06-22   PCP: [name], MD  Patient is from: Home.  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  Per patient's mother, he recently quit drinking but may take others pain meds.  DOA: 07/25/2019 LOS: 10  Brief Narrative / Interim history: 54 year old gentleman with past medical history remarkable for HTN, CVA, GERD, depression/anxiety, chronic pain syndrome, EtOH dependence,history of polysubstance abuse including alcohol, PCP, heroin, cocaine who was brought in by EMS after being found on the sidewalk with increased heart rate and respiratory rate.  Admitted for polysubstance abuse, encephalopathy.  Initially requiring Precedex drip went to the ICU for agitation. Due to delay in LP, neurology recommending recommended empiric treatment with 14 days of IV antibiotics for meningitis.  However, ID suggested shortening the antibiotic course if he clinically improves and transition to p.o. Patient underwent LP on 5/2. CSF fluid was unrevealing.  Patient had CT lumbar spine on 5/6 for lower back pain concerning for discitis/osteomyelitis. MRI of lumbar spine revealed possible discitis/osteomyelitis at L1-L2 with prominent anterior epidural phlegmon measuring up to 8 mm in thickness with moderate thecal sac compression but no large drainable collection. Infectious disease formally consulted on 5/7 I recommended IV antibiotics with vancomycin and ceftriaxone for a total of 6 weeks.   Subjective: Seen and examined earlier this morning.  No major events overnight or this morning.  He is alert and awake and appropriately oriented.  Complains low back pain but denies any focal neuro symptoms in his legs or bowel or bladder issues.  He denies chest pain or dyspnea.  Objective: Vitals:   08/04/19 1317 08/04/19 2309 08/05/19 0609 08/05/19 1353  BP: 124/88 125/79 (!) 138/96 (!) 127/91  Pulse: 90 (!) 105  (!) 102 (!) 101  Resp: 20  20   Temp: 99 F (37.2 C) 98.6 F (37 C) 98.5 F (36.9 C) 98.2 F (36.8 C)  TempSrc: Oral Oral Oral Oral  SpO2: 96% 96% 100% 100%  Weight:      Height:        Intake/Output Summary (Last 24 hours) at 08/05/2019 1416 Last data filed at 08/05/2019 1243 Gross per 24 hour  Intake 240 ml  Output 3 ml  Net 237 ml   Filed Weights   07/26/19 1858  Weight: 94.3 kg    Examination:   GENERAL: No apparent distress.  Nontoxic. HEENT: MMM.  Vision and hearing grossly intact.  NECK: Supple.  No apparent JVD.  RESP:  No IWOB.  Fair aeration bilaterally. CVS:  RRR. Heart sounds normal.  ABD/GI/GU: Bowel sounds present. Soft. Non tender.  MSK/EXT:  Moves extremities. No apparent deformity. No edema.  SKIN: no apparent skin lesion or wound NEURO: Awake, alert and oriented appropriately.  No apparent focal neuro deficit. PSYCH: Calm. Normal affect.  Procedures:  5/2-lumbar puncture  Microbiology summarized: 4/27-influenza PCR and COVID-19 PCR negative. 4/27-catheterized urine culture negative. 4/29-MRSA PCR screen negative. 4/27-blood cultures negative x2. 5/2-CSF cultures negative.  Assessment & Plan: Acute metabolic encephalopathy: Multifactorial including polysubstance, possible meningitis, delirium and osteomyelitis. UDS positive for amphetamine, opiates and benzo. MRI brain, EEG, CSF study, ammonia, andTSH unrevealing. B12 low. B1 pending.  Encephalopathy resolved. -Scheduled p.o. Tylenol 1 g 3 times daily and weaned opiates and benzo. -Frequent reorientation, fall and delirium precautions. -Treat treatable causes as below.  Vitamin B12 deficiency: B12 147. -Subcu vitamin B12 1000 mcg subcu x1 week followed by 2000 mcg p.o.  daily -Repeat B12 level 1 month  Bacterial meningitis? CSF study, cultures and MRI brain unrevealing but CSF samples obtained about 5 days after IV antibiotic initiation, so can call our culture meningitis.  Encephalopathy  resolved. -Neuro-14 days of empiric IV ampicillin, vancomycin and Rocephin for 14 days.  -HSV PCR negative. Discontinued acyclovir on 5/7 -Neurology signed off.  L1-L2 discitis/osteomyelitis with epidural phlegmon but no drainable fluid-no red flags on exam but limited due to mental status.  Has history of lumbar spinal fusion at L4-5 in 03/2019.  Also admits to injecting heroin about 4 months ago. -ID recommendations:  -vancomycin and ceftriaxone for a total of 6 weeks. End date 09/05/19  -weekly CBC with differential and Vanco trough values faxed to [phone]  -follow-up with Dr. Stanford Breed in 4 weeks.  -PICC line removed after completion of IV antibiotics -PICC line placed on 5/7. OPAT order in place.    Polysubstance abuse with EtOH, amphetamine, opiates,benzodiazepine Hx PCP,cocaine, heroin abuse -Off Precedex drip.   -Continue folic acid, thiamine-increased thiamine to 250 mg daily on 5/7  Essential hypertension: Normotensive Sinus tachycardia-improved. -Continue p.o. Cardizem and losartan  Chronic pain syndrome -Scheduled Tylenol with as needed oxycodone -Continue gabapentin.  GERD: Stable -continue PPI  Depression/anxiety: Stable. -Continue Wellbutrin and as needed Ativan  Generalized weakness: -PT/OT            DVT prophylaxis: Subcu Lovenox Code Status: Full code Family Communication: Updated patient's mother over the phone on 5/7.  Attempted to call patient's significant other for update but no answer.  Status is: Inpatient  Remains inpatient appropriate because:Unsafe d/c plan.  Patient to be discharged to SNF with PICC line to complete antibiotic course through 09/05/19   Dispo: The patient is from: Home              Anticipated d/c is to: SNF              Anticipated d/c date is: 2 days              Patient currently is medically stable to d/c.        Consultants:  Neurology Infectious disease   Sch Meds:  Scheduled Meds: .  acetaminophen  1,000 mg Oral Q8H  . benztropine  1 mg Oral Daily  . buPROPion  300 mg Oral Daily  . Chlorhexidine Gluconate Cloth  6 each Topical Daily  . Chlorhexidine Gluconate Cloth  6 each Topical Daily  . cyanocobalamin  1,000 mcg Subcutaneous Daily  . diltiazem  60 mg Oral Q6H  . enoxaparin (LOVENOX) injection  40 mg Subcutaneous Daily  . folic acid  1 mg Oral Daily  . gabapentin  300 mg Oral BID  . ibuprofen  800 mg Oral Once  . insulin aspart  0-5 Units Subcutaneous QHS  . insulin aspart  0-9 Units Subcutaneous TID WC  . losartan  50 mg Oral Daily  . pantoprazole  40 mg Oral Daily  . thiamine  250 mg Oral Daily  . [START ON 08/07/2019] vitamin B-12  2,000 mcg Oral Daily   Continuous Infusions: . sodium chloride Stopped (07/28/19 1838)  . ampicillin (OMNIPEN) IV 2 g (08/05/19 1338)  . cefTRIAXone (ROCEPHIN)  IV 2 g (08/05/19 0606)  . lactated ringers 50 mL/hr at 08/03/19 1839  . vancomycin 1,500 mg (08/05/19 1008)   PRN Meds:.sodium chloride, albuterol, HYDROmorphone (DILAUDID) injection, labetalol, LORazepam, metoprolol tartrate, Muscle Rub, ondansetron **OR** ondansetron (ZOFRAN) IV, oxyCODONE, sodium chloride flush  Antimicrobials: Anti-infectives (From admission,  onward)   Start     Dose/Rate Route Frequency Ordered Stop   07/31/19 2200  vancomycin (VANCOREADY) IVPB 1500 mg/300 mL     1,500 mg 150 mL/hr over 120 Minutes Intravenous Every 12 hours 07/31/19 1858     07/31/19 1800  ampicillin (OMNIPEN) 1 g in sodium chloride 0.9 % 100 mL IVPB  Status:  Discontinued     1 g 300 mL/hr over 20 Minutes Intravenous Every 6 hours 07/31/19 1719 07/31/19 1722   07/31/19 1800  cefTRIAXone (ROCEPHIN) 2 g in sodium chloride 0.9 % 100 mL IVPB     2 g 200 mL/hr over 30 Minutes Intravenous Every 12 hours 07/31/19 1720     07/31/19 1800  ampicillin (OMNIPEN) 2 g in sodium chloride 0.9 % 100 mL IVPB     2 g 300 mL/hr over 20 Minutes Intravenous Every 4 hours 07/31/19 1722      07/31/19 1730  cefTRIAXone (ROCEPHIN) 2 g in sodium chloride 0.9 % 100 mL IVPB  Status:  Discontinued     2 g 200 mL/hr over 30 Minutes Intravenous Every 24 hours 07/31/19 1716 07/31/19 1720   07/29/19 1000  vancomycin (VANCOREADY) IVPB 1500 mg/300 mL  Status:  Discontinued     1,500 mg 150 mL/hr over 120 Minutes Intravenous Every 12 hours 07/28/19 2055 07/31/19 1201   07/28/19 2200  ampicillin (OMNIPEN) 2 g in sodium chloride 0.9 % 100 mL IVPB  Status:  Discontinued     2 g 300 mL/hr over 20 Minutes Intravenous Every 4 hours 07/28/19 1808 07/31/19 1205   07/28/19 2000  vancomycin (VANCOREADY) IVPB 2000 mg/400 mL     2,000 mg 200 mL/hr over 120 Minutes Intravenous  Once 07/28/19 1759 07/28/19 2258   07/28/19 2000  acyclovir (ZOVIRAX) 900 mg in dextrose 5 % 150 mL IVPB  Status:  Discontinued     900 mg 168 mL/hr over 60 Minutes Intravenous Every 8 hours 07/28/19 1805 08/04/19 1327   07/28/19 1830  ampicillin (OMNIPEN) 1 g in sodium chloride 0.9 % 100 mL IVPB  Status:  Discontinued     1 g 300 mL/hr over 20 Minutes Intravenous Every 6 hours 07/28/19 1752 07/28/19 1808   07/28/19 1815  ampicillin (OMNIPEN) 2 g in sodium chloride 0.9 % 100 mL IVPB     2 g 300 mL/hr over 20 Minutes Intravenous  Once 07/28/19 1808 07/28/19 1935   07/28/19 1800  cefTRIAXone (ROCEPHIN) 2 g in sodium chloride 0.9 % 100 mL IVPB  Status:  Discontinued     2 g 200 mL/hr over 30 Minutes Intravenous Every 12 hours 07/28/19 1752 07/31/19 1205   07/27/19 1100  Ampicillin-Sulbactam (UNASYN) 3 g in sodium chloride 0.9 % 100 mL IVPB  Status:  Discontinued     3 g 200 mL/hr over 30 Minutes Intravenous Every 6 hours 07/27/19 1001 07/28/19 1157   07/26/19 0300  doxycycline (VIBRAMYCIN) 100 mg in sodium chloride 0.9 % 250 mL IVPB  Status:  Discontinued     100 mg 125 mL/hr over 120 Minutes Intravenous Every 12 hours 07/26/19 0209 07/26/19 1001   07/25/19 2345  cefTRIAXone (ROCEPHIN) 1 g in sodium chloride 0.9 % 100 mL IVPB      1 g 200 mL/hr over 30 Minutes Intravenous  Once 07/25/19 2343 07/26/19 0141       I have personally reviewed the following labs and images: CBC: Recent Labs  Lab 07/31/19 0321 08/01/19 0212 08/03/19 0306 08/04/19 0424 08/05/19 0330  WBC 9.4 10.4 9.3 11.8* 10.8*  NEUTROABS 7.1  --   --   --   --   HGB 10.2* 9.3* 9.1* 9.3* 9.8*  HCT 34.3* 31.1* 29.9* 29.9* 31.9*  MCV 79.4* 78.9* 78.1* 78.1* 78.8*  PLT 511* 487* 475* 489* 521*   BMP &GFR Recent Labs  Lab 08/01/19 0212 08/02/19 0448 08/03/19 0306 08/04/19 0424 08/05/19 0330  NA 133* 135 133* 131* 138  K 3.1* 3.1* 3.7 3.2* 4.1  CL 104 102 99 97* 105  CO2 20* 23 23 24 25   GLUCOSE 131* 114* 101* 131* 101*  BUN 9 6 5* 7 6  CREATININE 0.83 0.70 0.59* 0.69 0.63  CALCIUM 8.2* 8.6* 8.4* 8.6* 8.7*  MG  --  1.7 1.7 1.6* 2.1  PHOS  --   --   --   --  3.4   Estimated Creatinine Clearance: 123.1 mL/min (by C-G formula based on SCr of 0.63 mg/dL). Liver & Pancreas: Recent Labs  Lab 08/01/19 0212 08/03/19 0306 08/04/19 0424 08/05/19 0330  AST 22 16 16   --   ALT 13 10 10   --   ALKPHOS 158* 145* 147*  --   BILITOT 0.4 0.4 0.3  --   PROT 6.6 6.6 6.7  --   ALBUMIN 2.9* 3.1* 3.1* 3.3*   No results for input(s): LIPASE, AMYLASE in the last 168 hours. No results for input(s): AMMONIA in the last 168 hours. Diabetic: No results for input(s): HGBA1C in the last 72 hours. Recent Labs  Lab 08/03/19 2121 08/04/19 1221 08/04/19 2240 08/05/19 0748 08/05/19 1148  GLUCAP 130* 136* 115* 103* 106*   Cardiac Enzymes: No results for input(s): CKTOTAL, CKMB, CKMBINDEX, TROPONINI in the last 168 hours. No results for input(s): PROBNP in the last 8760 hours. Coagulation Profile: No results for input(s): INR, PROTIME in the last 168 hours. Thyroid Function Tests: No results for input(s): TSH, T4TOTAL, FREET4, T3FREE, THYROIDAB in the last 72 hours. Lipid Profile: No results for input(s): CHOL, HDL, LDLCALC, TRIG, CHOLHDL,  LDLDIRECT in the last 72 hours. Anemia Panel: No results for input(s): VITAMINB12, FOLATE, FERRITIN, TIBC, IRON, RETICCTPCT in the last 72 hours. Urine analysis:    Component Value Date/Time   COLORURINE YELLOW 07/25/2019 1805   APPEARANCEUR HAZY (A) 07/25/2019 1805   LABSPEC 1.011 07/25/2019 1805   PHURINE 6.0 07/25/2019 1805   GLUCOSEU NEGATIVE 07/25/2019 1805   HGBUR SMALL (A) 07/25/2019 1805   BILIRUBINUR NEGATIVE 07/25/2019 1805   BILIRUBINUR negative 04/10/2016 1702   KETONESUR NEGATIVE 07/25/2019 1805   PROTEINUR 30 (A) 07/25/2019 1805   UROBILINOGEN 0.2 04/10/2016 1702   UROBILINOGEN 0.2 12/04/2013 1044   NITRITE NEGATIVE 07/25/2019 1805   LEUKOCYTESUR SMALL (A) 07/25/2019 1805   Sepsis Labs: Invalid input(s): PROCALCITONIN, LACTICIDVEN  Microbiology: Recent Results (from the past 240 hour(s))  MRSA PCR Screening     Status: None   Collection Time: 07/27/19  2:42 PM   Specimen: Nasopharyngeal  Result Value Ref Range Status   MRSA by PCR NEGATIVE NEGATIVE Final    Comment:        The GeneXpert MRSA Assay (FDA approved for NASAL specimens only), is one component of a comprehensive MRSA colonization surveillance program. It is not intended to diagnose MRSA infection nor to guide or monitor treatment for MRSA infections. Performed at [facility], [address], [place], [place] [address]   CSF culture     Status: None   Collection Time: 07/30/19 12:35 PM  Specimen: PATH Cytology CSF; Cerebrospinal Fluid  Result Value Ref Range Status   Specimen Description   Final    CSF Performed at [facility], [address], [place], [place] [address]    Special Requests   Final    NONE Performed at [facility], [address], [place], [place] [address]    Gram Stain   Final    WBC PRESENT, PREDOMINANTLY PMN NO ORGANISMS SEEN CYTOSPIN SMEAR    Culture   Final    NO GROWTH 3 DAYS Performed at [facility], [address], [place], [place] [address]    Report Status 08/03/2019 FINAL  Final  Culture, fungus without smear     Status: None (Preliminary result)   Collection Time: 07/30/19 12:35 PM   Specimen: PATH Cytology CSF; Cerebrospinal Fluid  Result Value Ref Range Status   Specimen Description   Final    CSF Performed at [facility], [address] [place], [place] [address]    Special Requests   Final    NONE Performed at [facility], [address] [place], [place] [address]    Culture   Final    NO FUNGUS ISOLATED AFTER 6 DAYS Performed at [facility], [address], [place], [place] [address]    Report Status PENDING  Incomplete    Radiology Studies: [place] EKG SITE RITE  Result Date: 08/05/2019 If Site Rite image not attached, placement could not be confirmed due to current cardiac rhythm.     Leeloo Silverthorne T. Jennalynn Rivard [facility]  If 7PM-7AM, please contact night-coverage [email or web address] Password [email or web address] 08/05/2019, 2:16 PM

## 2019-08-05 NOTE — Progress Notes (Signed)
Spoke with Ria Comment RN re PICC order by Dr Cyndia Skeeters.  Pt currently has DL  PICC in working well.  Secure chat sent to Dr Cyndia Skeeters.  PICC order to be cancelled.

## 2019-08-05 NOTE — Progress Notes (Signed)
Physical Therapy Treatment Patient Details Name: [name] MRN: [id number] DOB: 1966-03-26 Today's Date: 08/05/2019    History of Present Illness 54 yo male admitted with toxic metabolic encephalopathy, bacterial meningitis, unwitness fall 5/2. Hx of recent lumbar sg L3-L5 03/2019, polysubstance abuse, schizophrenia, ETOH abuse, CVA, bipolar d/o, back pain, L ankle fusion 2019, R TKA 2018, asthma.    PT Comments    Pt eventually agreeable to participating with PT. He continues to report significant back pain-rated 9/10 on today. He is impulsive and unsafe at times. LOB x 1 requiring external assist to prevent fall. D/C recommendations have been updated-recommend [facility].     Follow Up Recommendations  SNF     Equipment Recommendations  None recommended by PT    Recommendations for Other Services       Precautions / Restrictions Precautions Precautions: Fall Precaution Comments: lumbar sg 03/2019 Restrictions Weight Bearing Restrictions: No    Mobility  Bed Mobility Overal bed mobility: Needs Assistance Bed Mobility: Supine to Sit     Supine to sit: Min guard;HOB elevated Sit to supine: Min assist;HOB elevated   General bed mobility comments: Assist for LEs back onto bed. Increased time.  Transfers Overall transfer level: Needs assistance Equipment used: 4-wheeled walker Transfers: Sit to/from [employer] Sit to Stand: Min assist Stand pivot transfers: Min assist       General transfer comment: Impulsive. Cues for safety, technique, hand placement. Stand pivot, bed to bsc. Fall risk. LOB x 1 posteriorly-external assist to prevent fall.  Ambulation/Gait Ambulation/Gait assistance: Min assist Gait Distance (Feet): 200 Feet Assistive device: 4-wheeled walker Gait Pattern/deviations: Step-through pattern;Decreased stride length     General Gait Details: Assist provided throughout distance to steady. Unsteady and remains a fall risk. Cues for  safety, pacing.   Stairs             Wheelchair Mobility    Modified Rankin (Stroke Patients Only)       Balance Overall balance assessment: Needs assistance;History of Falls         Standing balance support: Bilateral upper extremity supported Standing balance-[name]: Poor                              Cognition Arousal/Alertness: Awake/alert Behavior During Therapy: WFL for tasks assessed/performed Overall Cognitive Status: Within Functional Limits for tasks assessed                                        Exercises      General Comments        Pertinent Vitals/Pain Pain Assessment: 0-10 Pain Score: 9  Pain Location: back Pain Descriptors / Indicators: Aching;Sore;Discomfort Pain Intervention(s): Monitored during session;Repositioned    Home Living                      Prior Function            PT Goals (current goals can now be found in the care plan section) Progress towards PT goals: Progressing toward goals    Frequency    Min 2X/week      PT Plan Discharge plan needs to be updated;Frequency needs to be updated    Co-evaluation              AM-PAC PT "6 Clicks" Mobility   Outcome  Measure  Help needed turning from your back to your side while in a flat bed without using bedrails?: A Little Help needed moving from lying on your back to sitting on the side of a flat bed without using bedrails?: A Little Help needed moving to and from a bed to a chair (including a wheelchair)?: A Little Help needed standing up from a chair using your arms (e.g., wheelchair or bedside chair)?: A Little Help needed to walk in hospital room?: A Little Help needed climbing 3-5 steps with a railing? : A Little 6 Click Score: 18    End of Session Equipment Utilized During Treatment: Gait belt Activity Tolerance: Patient tolerated treatment well Patient left: in bed;with bed alarm set;with call bell/phone  within reach   PT Visit Diagnosis: History of falling (Z91.81);Difficulty in walking, not elsewhere classified (R26.2);Pain;Muscle weakness (generalized) (M62.81) Pain - part of body: (back)     Time: 4098-1191 PT Time Calculation (min) (ACUTE ONLY): 25 min  Charges:  $Gait Training: 23-37 mins                        Faye Ramsay, PT Acute Rehabilitation

## 2019-08-05 NOTE — Progress Notes (Addendum)
NEURO HOSPITALIST PROGRESS NOTE   Subjective: Patient awake, alert, NAD.  Exam: Vitals:   08/04/19 2309 08/05/19 0609  BP: 125/79 (!) 138/96  Pulse: (!) 105 (!) 102  Resp:  20  Temp: 98.6 F (37 C) 98.5 F (36.9 C)  SpO2: 96% 100%    Physical Exam  Constitutional: Appears well-developed and well-nourished.  Psych: Affect appropriate to situation Eyes: Normal external eye and conjunctiva. HENT: Normocephalic, no lesions, without obvious abnormality.   Musculoskeletal-no joint tenderness, deformity or swelling Cardiovascular: Normal rate and regular rhythm.  Respiratory: Effort normal, non-labored breathing saturations WNL GI: Soft.  No distension. There is no tenderness.  Skin: WDI   Neuro:  Mental Status: Alert, oriented, thought content appropriate.  Speech fluent without evidence of aphasia.  Able to follow commands without difficulty. Cranial Nerves: II:  Visual fields grossly normal,  III,IV, VI: ptosis not present, extra-ocular motions intact bilaterally pupils equal, round, reactive to light and accommodation V,VII: smile symmetric, facial light touch sensation normal bilaterally VIII: hearing normal bilaterally IX,X: uvula rises symmetrically XI: bilateral shoulder shrug XII: midline tongue extension Motor: Right : Upper extremity   5/5 Left:     Upper extremity   5/5  Lower extremity   5/5  Lower extremity   5/5 Tone and bulk:normal tone throughout; no atrophy noted Sensory:  light touch intact throughout, bilaterally Plantars: Right: downgoing   Left: downgoing Cerebellar: No ataxia FNF or HTS Gait: deferred    Medications:  Scheduled: . acetaminophen  1,000 mg Oral Q8H  . benztropine  1 mg Oral Daily  . buPROPion  300 mg Oral Daily  . Chlorhexidine Gluconate Cloth  6 each Topical Daily  . Chlorhexidine Gluconate Cloth  6 each Topical Daily  . cyanocobalamin  1,000 mcg Subcutaneous Daily  . diltiazem  60 mg Oral Q6H  .  enoxaparin (LOVENOX) injection  40 mg Subcutaneous Daily  . folic acid  1 mg Oral Daily  . gabapentin  300 mg Oral BID  . ibuprofen  800 mg Oral Once  . insulin aspart  0-5 Units Subcutaneous QHS  . insulin aspart  0-9 Units Subcutaneous TID WC  . losartan  50 mg Oral Daily  . pantoprazole  40 mg Oral Daily  . thiamine  250 mg Oral Daily  . [START ON 08/07/2019] vitamin B-12  2,000 mcg Oral Daily   Continuous: . sodium chloride Stopped (07/28/19 1838)  . ampicillin (OMNIPEN) IV 2 g (08/05/19 0530)  . cefTRIAXone (ROCEPHIN)  IV 2 g (08/05/19 0606)  . lactated ringers 50 mL/hr at 08/03/19 1839  . vancomycin 1,500 mg (08/04/19 2108)   KLK:JZPHXT chloride, albuterol, HYDROmorphone (DILAUDID) injection, labetalol, LORazepam, metoprolol tartrate, Muscle Rub, ondansetron **OR** ondansetron (ZOFRAN) IV, oxyCODONE, sodium chloride flush  Pertinent Labs/Diagnostics: B1: pending ESR:74 CRP:10.0  CT LUMBAR SPINE W CONTRAST  Result Date: 08/03/2019 CLINICAL DATA:  Low back pain EXAM: CT LUMBAR SPINE WITH CONTRAST TECHNIQUE: Multidetector CT imaging of the lumbar spine was performed with intravenous contrast administration. CONTRAST:  145m OMNIPAQUE IOHEXOL 300 MG/ML  SOLN COMPARISON:  Radiographs 08/01/2019, MR 09/21/2018 FINDINGS: Segmentation: Transitional lumbosacral segment assigned L5 as on prior studies Alignment: Normal Vertebrae: Patchy areas of cortical loss in the superior endplate and adjacent anterior body of L2,, and the inferior endplate and adjacent anterior body of L1. Paraspinal and other soft tissues: Inflammatory/edematous changes lateral and anterior to  the L1-2 interspace with ill-defined enhancement. Subtle hyperdensity posterior to the L1-2 interspace, with some image degradation related to body habitus, cannot entirely exclude epidural abscess or phlegmon. Disc levels: T11-12: Unremarkable T12-L1: Anterior endplate spurring. L1-2: There is not significant narrowing of the  interspace but bony adjacent changes and paravertebral changes as detailed above L2-3: Possible mild disc bulge versus anterior epidural process, incompletely characterized. L3-4: Anterior posterior endplate spurring. Previous left decompressive laminotomy. L4-5: Bilateral pedicle screws with vertical interconnecting hardware, intact without surrounding lucency. Cages and bone graft in the interspace without convincing bone bridging. L5-S1: Unremarkable. L5 transitional left transverse process with fusion to the sacrum. IMPRESSION: 1. Suspected discitis/osteomyelitis L1-2 with paraspinal phlegmon. Consider MR lumbar spine with contrast to exclude epidural abscess. 2. Postop changes L3-L5. Electronically Signed   By: Lucrezia Europe M.D.   On: 08/03/2019 15:30   MR Lumbar Spine W Wo Contrast  Result Date: 08/04/2019 CLINICAL DATA:  Low back pain. EXAM: MRI LUMBAR SPINE WITHOUT AND WITH CONTRAST TECHNIQUE: Multiplanar and multiecho pulse sequences of the lumbar spine were obtained without and with intravenous contrast. CONTRAST:  61m GADAVIST GADOBUTROL 1 MMOL/ML IV SOLN COMPARISON:  CT of the lumbar spine performed on Aug 03, 2019 FINDINGS: The study is severely degraded by motion. Axial images are essentially nondiagnostic. Segmentation:  Standard. Alignment: Small retrolisthesis of L1 over L2 and L3 over L4 and small anterolisthesis of L4 over L5. Vertebrae: Prominent marrow edema and contrast enhancement of the L1 and L2 vertebral bodies extending into the corresponding pedicles is seen with associated minimal endplate irregularity and associated increased T2 signal of the corresponding disc, concerning for discitis/osteomyelitis. There is a prominent anterior epidural phlegmon along the posterior wall of the L1 and L2 vertebral bodies measuring up to 8 mm in thickness. Two non enhancing focus are noted at the L1 and L2 levels measuring approximately 2 mm in thickness, may represent tiny abscesses. No large  drainable collection identified. Postsurgical changes from interbody fusion, posterior transpedicular fixation and laminectomy at L4-5. Conus medullaris and cauda equina: Limited evaluation due to motion; grossly unremarkable. Paraspinal and other soft tissues: Edema and contrast enhancement is seen in the psoas muscle bilaterally and paraspinal musculature at the L1-2 level. Disc levels: T12-L1: No spinal canal or neural foraminal stenosis. L1-2: Moderate thecal sac compression related to the phlegmon described above with T1 hypointense/enhancing tissue extending into the bilateral neural foramen. L2-3: Disc bulge resulting in mild bilateral neural foraminal narrowing. Mild narrowing of the thecal sac related to the phlegmon that disc space level. L3-4: Disc bulge resulting in mild spinal canal stenosis and mild bilateral neural foraminal narrowing. L4-5: Disc bulge with superimposed left foraminal disc protrusion resulting in moderate left neural foraminal narrowing. The no spinal canal stenosis. L5-S1: No spinal canal or neural foraminal stenosis. IMPRESSION: 1. Severely motion degraded study. Findings concerning for discitis/osteomyelitis at L1-L2 with prominent anterior epidural phlegmon measuring up to 8 mm in thickness with moderate thecal sac compression. No large drainable collection is identified. 2. Bilateral psoas and paraspinal muscle edema and contrast enhancement at the L1-2 level. 3. Degenerative changes with moderate left neural foraminal narrowing at L4-5. These results will be called to the ordering clinician or representative by the Radiologist Assistant, and communication documented in the PACS or [employer] Electronically Signed   By: KPedro EarlsM.D.   On: 08/04/2019 10:12   [place] SITE RITE  Result Date: 08/05/2019 If Site Rite image not attached, placement could  not be confirmed due to current cardiac rhythm.  Assessment: 54 year old male with altered mental  status and abnormal LP. Lumbar spine MRI confirms discitis/osteomyelitis at L1-L2 with paravertebral phlegmon.  1. ID is following. Patient remains on vancomycin, ampicillin and rocephin. Acyclovir has been discontinued. 2. Vitamin B12 level of 147 is markedly low by Neurological standards.   3. EEG: 08/01/19: no seizures or epileptiform discharges 4. MRI brain did not show any acute abnormality. Chronic microvascular ischemic changes in the white matter are noted.  Recommendations: 1. Continue ampicillin, ceftriaxone and vancomycin. Extended course has been recommended by ID.  2. Continue Vitamin B12 supplementation. Would start with SQ B12 1000 microgram qd x 1 week (ordered), then transition to 2000 mcg po qd thereafter. Follow up B12 level in 1 month.  3. Neurology to sign off at this time. Please call with any further questions.  Laurey Morale, MSN, NP-C [facility] [phone]  Electronically signed: Dr. Kerney Elbe 08/05/2019, 9:01 AM

## 2019-08-06 DIAGNOSIS — D509 Iron deficiency anemia, unspecified: Secondary | ICD-10-CM

## 2019-08-06 DIAGNOSIS — E876 Hypokalemia: Secondary | ICD-10-CM

## 2019-08-06 DIAGNOSIS — E871 Hypo-osmolality and hyponatremia: Secondary | ICD-10-CM

## 2019-08-06 LAB — CBC
HCT: 31 % — ABNORMAL LOW (ref 39.0–52.0)
Hemoglobin: 9.5 g/dL — ABNORMAL LOW (ref 13.0–17.0)
MCH: 23.9 pg — ABNORMAL LOW (ref 26.0–34.0)
MCHC: 30.6 g/dL (ref 30.0–36.0)
MCV: 78.1 fL — ABNORMAL LOW (ref 80.0–100.0)
Platelets: 516 10*3/uL — ABNORMAL HIGH (ref 150–400)
RBC: 3.97 MIL/uL — ABNORMAL LOW (ref 4.22–5.81)
RDW: 17.2 % — ABNORMAL HIGH (ref 11.5–15.5)
WBC: 12.6 10*3/uL — ABNORMAL HIGH (ref 4.0–10.5)
nRBC: 0 % (ref 0.0–0.2)

## 2019-08-06 LAB — GLUCOSE, CAPILLARY
Glucose-Capillary: 115 mg/dL — ABNORMAL HIGH (ref 70–99)
Glucose-Capillary: 103 mg/dL — ABNORMAL HIGH (ref 70–99)
Glucose-Capillary: 101 mg/dL — ABNORMAL HIGH (ref 70–99)
Glucose-Capillary: 112 mg/dL — ABNORMAL HIGH (ref 70–99)

## 2019-08-06 LAB — RENAL FUNCTION PANEL
Albumin: 3.2 g/dL — ABNORMAL LOW (ref 3.5–5.0)
Anion gap: 10 (ref 5–15)
BUN: 6 mg/dL (ref 6–20)
CO2: 23 mmol/L (ref 22–32)
Calcium: 8.6 mg/dL — ABNORMAL LOW (ref 8.9–10.3)
Chloride: 98 mmol/L (ref 98–111)
Creatinine, Ser: 0.62 mg/dL (ref 0.61–1.24)
GFR calc Af Amer: >60 mL/min (ref >60)
GFR calc non Af Amer: >60 mL/min (ref >60)
Glucose, Bld: 130 mg/dL — ABNORMAL HIGH (ref 70–99)
Phosphorus: 2.9 mg/dL (ref 2.5–4.6)
Potassium: 3.2 mmol/L — ABNORMAL LOW (ref 3.5–5.1)
Sodium: 131 mmol/L — ABNORMAL LOW (ref 135–145)

## 2019-08-06 LAB — MAGNESIUM: Magnesium: 1.6 mg/dL — ABNORMAL LOW (ref 1.7–2.4)

## 2019-08-06 LAB — RETICULOCYTES
Immature Retic Fract: 13.8 % (ref 2.3–15.9)
RBC.: 4.03 MIL/uL — ABNORMAL LOW (ref 4.22–5.81)
Retic Count, Absolute: 64.1 10*3/uL (ref 19.0–186.0)
Retic Ct Pct: 1.6 % (ref 0.4–3.1)

## 2019-08-06 LAB — IRON AND TIBC
Iron: 15 ug/dL — ABNORMAL LOW (ref 45–182)
Saturation Ratios: 4 % — ABNORMAL LOW (ref 17.9–39.5)
TIBC: 350 ug/dL (ref 250–450)
UIBC: 335 ug/dL

## 2019-08-06 LAB — VITAMIN B12: Vitamin B-12: >7500 pg/mL — ABNORMAL HIGH (ref 180–914)

## 2019-08-06 LAB — FOLATE: Folate: 28.6 ng/mL (ref >5.9)

## 2019-08-06 LAB — FERRITIN: Ferritin: 50 ng/mL (ref 24–336)

## 2019-08-06 MED ORDER — DILTIAZEM HCL ER COATED BEADS 240 MG PO CP24
240.0000 mg | ORAL_CAPSULE | Freq: Every day | ORAL | Status: DC
  Administered 2019-08-06 – 2019-09-05 (×30): 240 mg via ORAL
  Filled 2019-08-06 (×31): qty 1

## 2019-08-06 MED ORDER — SODIUM CHLORIDE 0.9 % IV SOLN
510.0000 mg | Freq: Once | INTRAVENOUS | Status: AC
  Administered 2019-08-07: 510 mg via INTRAVENOUS
  Filled 2019-08-06: qty 510

## 2019-08-06 MED ORDER — POTASSIUM CHLORIDE CRYS ER 20 MEQ PO TBCR
40.0000 meq | EXTENDED_RELEASE_TABLET | ORAL | Status: AC
  Administered 2019-08-06 (×2): 40 meq via ORAL
  Filled 2019-08-06 (×2): qty 2

## 2019-08-06 MED ORDER — MAGNESIUM SULFATE 2 GM/50ML IV SOLN
2.0000 g | Freq: Once | INTRAVENOUS | Status: AC
  Administered 2019-08-06: 2 g via INTRAVENOUS
  Filled 2019-08-06: qty 50

## 2019-08-06 MED ORDER — LORAZEPAM 2 MG/ML IJ SOLN: 0.5000 mg | Freq: Four times a day (QID) | INTRAMUSCULAR | Status: DC | PRN

## 2019-08-06 NOTE — Progress Notes (Signed)
PROGRESS NOTE  [name] VOZ:[id number] DOB: [birth year]   PCP: [name], MD  Patient is from: Home.  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  Per patient's mother, he recently quit drinking but may take others pain meds.  DOA: 07/25/2019 LOS: 11  Brief Narrative / Interim history: 54 year old gentleman with past medical history remarkable for HTN, CVA, GERD, depression/anxiety, chronic pain syndrome, EtOH dependence,history of polysubstance abuse including alcohol, PCP, heroin, cocaine who was brought in by EMS after being found on the sidewalk with increased heart rate and respiratory rate.  Admitted for polysubstance abuse, encephalopathy.  Initially requiring Precedex drip went to the ICU for agitation. Due to delay in LP, neurology recommending recommended empiric treatment with 14 days of IV antibiotics for meningitis.  However, ID suggested shortening the antibiotic course if he clinically improves and transition to p.o. Patient underwent LP on 5/2. CSF fluid was unrevealing.  Patient had CT lumbar spine on 5/6 for lower back pain concerning for discitis/osteomyelitis. MRI of lumbar spine revealed possible discitis/osteomyelitis at L1-L2 with prominent anterior epidural phlegmon measuring up to 8 mm in thickness with moderate thecal sac compression but no large drainable collection. Infectious disease formally consulted on 5/7 and recommended IV antibiotics with vancomycin and ceftriaxone for a total of 6 weeks.  Waiting on SNF bed.  Subjective: Seen and examined earlier this morning.  No major events overnight of this morning.  Complains lower back pain.  Denies focal neuro symptoms in lower extremities.  Denies bowel or bladder issues.   Objective: Vitals:   08/05/19 0609 08/05/19 1353 08/05/19 2017 08/06/19 0610  BP: (!) 138/96 (!) 127/91 (!) 146/97 (!) 142/83  Pulse: (!) 102 (!) 101 (!) 108 100  Resp: 20  18 19   Temp:  98.5 F (36.9 C) 98.2 F (36.8 C) 98.4 F (36.9 C) 98.2 F (36.8 C)  TempSrc: Oral Oral Oral Oral  SpO2: 100% 100% 97% 100%  Weight:      Height:        Intake/Output Summary (Last 24 hours) at 08/06/2019 1102 Last data filed at 08/05/2019 1727 Gross per 24 hour  Intake 240 ml  Output 2 ml  Net 238 ml   Filed Weights   07/26/19 1858  Weight: 94.3 kg    Examination:  GENERAL: No apparent distress.  Nontoxic. HEENT: MMM.  Vision and hearing grossly intact.  NECK: Supple.  No apparent JVD.  RESP: On room air.  No IWOB.  Fair aeration bilaterally. CVS:  RRR. Heart sounds normal.  ABD/GI/GU: BS+. Abd soft, NTND.  MSK/EXT:  Moves extremities. No apparent deformity. No edema.  SKIN: no apparent skin lesion or wound NEURO: Awake, alert and oriented appropriately.  No apparent focal neuro deficit. PSYCH: Calm. Normal affect.   Procedures:  5/2-lumbar puncture  Microbiology summarized: 4/27-influenza PCR and COVID-19 PCR negative. 4/27-catheterized urine culture negative. 4/29-MRSA PCR screen negative. 4/27-blood cultures negative x2. 5/2-CSF cultures negative.  Assessment & Plan: Acute metabolic encephalopathy: Multifactorial including polysubstance, possible meningitis, delirium and osteomyelitis. UDS positive for amphetamine, opiates and benzo. MRI brain, EEG, CSF study, ammonia, and TSH unrevealing. B12 low. B1 pending.  Encephalopathy resolved. -Scheduled p.o. Tylenol 1 g 3 times daily and weaning opiates and benzo. -Frequent reorientation, fall and delirium precautions. -Treat treatable causes as below.  Vitamin B12 deficiency: B12 147. -Vitamin B12 2000 mcg p.o. daily -Repeat B12 level 1 month  Bacterial meningitis? CSF study, cultures and MRI brain  unrevealing but CSF samples obtained about 5 days after IV antibiotic initiation, so can call our culture meningitis.  Encephalopathy resolved.  -Neuro-14 days of empiric IV ampicillin, vancomycin and Rocephin for 14  days.  -HSV PCR negative. Discontinued acyclovir on 5/7 -Neurology signed off.  L1-L2 discitis/osteomyelitis with epidural phlegmon but no drainable fluid-no red flags on exam but limited due to mental status.  Has history of lumbar spinal fusion at L4-5 in 03/2019.  Also admits to injecting heroin about 4 months ago. -ID recommendations:  -vancomycin and ceftriaxone for a total of 6 weeks. End date 09/05/19  -weekly CBC with differential and Vanco trough values faxed to [phone]  -follow-up with Dr. Stanford Breed in 4 weeks.  -PICC line removed after completion of IV antibiotics -PICC line placed on 5/7. OPAT order in place.    Polysubstance abuse with EtOH, amphetamine, opiates,benzodiazepine Hx PCP,cocaine, heroin abuse -Off Precedex drip.   -Continue folic acid, thiamine-increased thiamine to 250 mg daily on 5/7  Essential hypertension: Normotensive Sinus tachycardia-improved. -Continue p.o. Cardizem and losartan  Chronic pain syndrome -Scheduled Tylenol with as needed oxycodone -Continue gabapentin.  Acute hyponatremia: Could be due to LR. -Discontinue LR -Recheck in the morning  Hypokalemia/hypomagnesemia -replenish and recheck.  Microcytic anemia: Baseline Hgb 10-11> 10.1 (admit)>>> 9.5.  Relatively stable. -Check anemia panel -Monitor intermittently  Leukocytosis/thrombocytosis-reactive?  Improving. -Continue monitoring  GERD: Stable -continue PPI  Depression/anxiety: Stable. -Continue Wellbutrin and as needed Ativan  Generalized weakness: -PT/OT            DVT prophylaxis: Subcu Lovenox Code Status: Full code Family Communication: Updated patient's mother over the phone on 5/7.  Attempted to call patient's significant other for update but no answer.  Status is: Inpatient  Remains inpatient appropriate because:Unsafe d/c plan and electrolyte derangements.  Patient to be discharged to SNF with PICC line to complete antibiotic course through  09/05/19   Dispo: The patient is from: Home              Anticipated d/c is to: SNF              Anticipated d/c date is: 2 days              Patient currently is not medically stable to d/c.due to electrolyte derangements.       Consultants:  Neurology Infectious disease   Sch Meds:  Scheduled Meds: . acetaminophen  1,000 mg Oral Q8H  . benztropine  1 mg Oral Daily  . buPROPion  300 mg Oral Daily  . Chlorhexidine Gluconate Cloth  6 each Topical Daily  . Chlorhexidine Gluconate Cloth  6 each Topical Daily  . diltiazem  240 mg Oral Daily  . enoxaparin (LOVENOX) injection  40 mg Subcutaneous Daily  . folic acid  1 mg Oral Daily  . gabapentin  300 mg Oral BID  . ibuprofen  800 mg Oral Once  . insulin aspart  0-5 Units Subcutaneous QHS  . insulin aspart  0-9 Units Subcutaneous TID WC  . losartan  50 mg Oral Daily  . pantoprazole  40 mg Oral Daily  . potassium chloride  40 mEq Oral Q3H  . thiamine  250 mg Oral Daily  . [START ON 08/07/2019] vitamin B-12  2,000 mcg Oral Daily   Continuous Infusions: . sodium chloride Stopped (07/28/19 1838)  . ampicillin (OMNIPEN) IV 2 g (08/06/19 0941)  . cefTRIAXone (ROCEPHIN)  IV 2 g (08/06/19 0618)  . vancomycin 1,500 mg (08/06/19 0943)   PRN  Meds:.sodium chloride, albuterol, HYDROmorphone (DILAUDID) injection, labetalol, LORazepam, metoprolol tartrate, Muscle Rub, ondansetron **OR** ondansetron (ZOFRAN) IV, oxyCODONE, sodium chloride flush  Antimicrobials: Anti-infectives (From admission, onward)   Start     Dose/Rate Route Frequency Ordered Stop   07/31/19 2200  vancomycin (VANCOREADY) IVPB 1500 mg/300 mL     1,500 mg 150 mL/hr over 120 Minutes Intravenous Every 12 hours 07/31/19 1858     07/31/19 1800  ampicillin (OMNIPEN) 1 g in sodium chloride 0.9 % 100 mL IVPB  Status:  Discontinued     1 g 300 mL/hr over 20 Minutes Intravenous Every 6 hours 07/31/19 1719 07/31/19 1722   07/31/19 1800  cefTRIAXone (ROCEPHIN) 2 g in sodium  chloride 0.9 % 100 mL IVPB     2 g 200 mL/hr over 30 Minutes Intravenous Every 12 hours 07/31/19 1720     07/31/19 1800  ampicillin (OMNIPEN) 2 g in sodium chloride 0.9 % 100 mL IVPB     2 g 300 mL/hr over 20 Minutes Intravenous Every 4 hours 07/31/19 1722     07/31/19 1730  cefTRIAXone (ROCEPHIN) 2 g in sodium chloride 0.9 % 100 mL IVPB  Status:  Discontinued     2 g 200 mL/hr over 30 Minutes Intravenous Every 24 hours 07/31/19 1716 07/31/19 1720   07/29/19 1000  vancomycin (VANCOREADY) IVPB 1500 mg/300 mL  Status:  Discontinued     1,500 mg 150 mL/hr over 120 Minutes Intravenous Every 12 hours 07/28/19 2055 07/31/19 1201   07/28/19 2200  ampicillin (OMNIPEN) 2 g in sodium chloride 0.9 % 100 mL IVPB  Status:  Discontinued     2 g 300 mL/hr over 20 Minutes Intravenous Every 4 hours 07/28/19 1808 07/31/19 1205   07/28/19 2000  vancomycin (VANCOREADY) IVPB 2000 mg/400 mL     2,000 mg 200 mL/hr over 120 Minutes Intravenous  Once 07/28/19 1759 07/28/19 2258   07/28/19 2000  acyclovir (ZOVIRAX) 900 mg in dextrose 5 % 150 mL IVPB  Status:  Discontinued     900 mg 168 mL/hr over 60 Minutes Intravenous Every 8 hours 07/28/19 1805 08/04/19 1327   07/28/19 1830  ampicillin (OMNIPEN) 1 g in sodium chloride 0.9 % 100 mL IVPB  Status:  Discontinued     1 g 300 mL/hr over 20 Minutes Intravenous Every 6 hours 07/28/19 1752 07/28/19 1808   07/28/19 1815  ampicillin (OMNIPEN) 2 g in sodium chloride 0.9 % 100 mL IVPB     2 g 300 mL/hr over 20 Minutes Intravenous  Once 07/28/19 1808 07/28/19 1935   07/28/19 1800  cefTRIAXone (ROCEPHIN) 2 g in sodium chloride 0.9 % 100 mL IVPB  Status:  Discontinued     2 g 200 mL/hr over 30 Minutes Intravenous Every 12 hours 07/28/19 1752 07/31/19 1205   07/27/19 1100  Ampicillin-Sulbactam (UNASYN) 3 g in sodium chloride 0.9 % 100 mL IVPB  Status:  Discontinued     3 g 200 mL/hr over 30 Minutes Intravenous Every 6 hours 07/27/19 1001 07/28/19 1157   07/26/19 0300   doxycycline (VIBRAMYCIN) 100 mg in sodium chloride 0.9 % 250 mL IVPB  Status:  Discontinued     100 mg 125 mL/hr over 120 Minutes Intravenous Every 12 hours 07/26/19 0209 07/26/19 1001   07/25/19 2345  cefTRIAXone (ROCEPHIN) 1 g in sodium chloride 0.9 % 100 mL IVPB     1 g 200 mL/hr over 30 Minutes Intravenous  Once 07/25/19 2343 07/26/19 0141  I have personally reviewed the following labs and images: CBC: Recent Labs  Lab 07/31/19 0321 07/31/19 0321 08/01/19 0212 08/03/19 0306 08/04/19 0424 08/05/19 0330 08/06/19 0323  WBC 9.4   < > 10.4 9.3 11.8* 10.8* 12.6*  NEUTROABS 7.1  --   --   --   --   --   --   HGB 10.2*   < > 9.3* 9.1* 9.3* 9.8* 9.5*  HCT 34.3*   < > 31.1* 29.9* 29.9* 31.9* 31.0*  MCV 79.4*   < > 78.9* 78.1* 78.1* 78.8* 78.1*  PLT 511*   < > 487* 475* 489* 521* 516*   < > = values in this interval not displayed.   BMP &GFR Recent Labs  Lab 08/02/19 0448 08/03/19 0306 08/04/19 0424 08/05/19 0330 08/06/19 0323  NA 135 133* 131* 138 131*  K 3.1* 3.7 3.2* 4.1 3.2*  CL 102 99 97* 105 98  CO2 23 23 24 25 23   GLUCOSE 114* 101* 131* 101* 130*  BUN 6 5* 7 6 6   CREATININE 0.70 0.59* 0.69 0.63 0.62  CALCIUM 8.6* 8.4* 8.6* 8.7* 8.6*  MG 1.7 1.7 1.6* 2.1 1.6*  PHOS  --   --   --  3.4 2.9   Estimated Creatinine Clearance: 123.1 mL/min (by C-G formula based on SCr of 0.62 mg/dL). Liver & Pancreas: Recent Labs  Lab 08/01/19 0212 08/03/19 0306 08/04/19 0424 08/05/19 0330 08/06/19 0323  AST 22 16 16   --   --   ALT 13 10 10   --   --   ALKPHOS 158* 145* 147*  --   --   BILITOT 0.4 0.4 0.3  --   --   PROT 6.6 6.6 6.7  --   --   ALBUMIN 2.9* 3.1* 3.1* 3.3* 3.2*   No results for input(s): LIPASE, AMYLASE in the last 168 hours. No results for input(s): AMMONIA in the last 168 hours. Diabetic: No results for input(s): HGBA1C in the last 72 hours. Recent Labs  Lab 08/05/19 0748 08/05/19 1148 08/05/19 1550 08/05/19 2019 08/06/19 0757  GLUCAP 103* 106*  88 108* 115*   Cardiac Enzymes: No results for input(s): CKTOTAL, CKMB, CKMBINDEX, TROPONINI in the last 168 hours. No results for input(s): PROBNP in the last 8760 hours. Coagulation Profile: No results for input(s): INR, PROTIME in the last 168 hours. Thyroid Function Tests: No results for input(s): TSH, T4TOTAL, FREET4, T3FREE, THYROIDAB in the last 72 hours. Lipid Profile: No results for input(s): CHOL, HDL, LDLCALC, TRIG, CHOLHDL, LDLDIRECT in the last 72 hours. Anemia Panel: No results for input(s): VITAMINB12, FOLATE, FERRITIN, TIBC, IRON, RETICCTPCT in the last 72 hours. Urine analysis:    Component Value Date/Time   COLORURINE YELLOW 07/25/2019 1805   APPEARANCEUR HAZY (A) 07/25/2019 1805   LABSPEC 1.011 07/25/2019 1805   PHURINE 6.0 07/25/2019 1805   GLUCOSEU NEGATIVE 07/25/2019 1805   HGBUR SMALL (A) 07/25/2019 1805   BILIRUBINUR NEGATIVE 07/25/2019 1805   BILIRUBINUR negative 04/10/2016 1702   KETONESUR NEGATIVE 07/25/2019 1805   PROTEINUR 30 (A) 07/25/2019 1805   UROBILINOGEN 0.2 04/10/2016 1702   UROBILINOGEN 0.2 12/04/2013 1044   NITRITE NEGATIVE 07/25/2019 1805   LEUKOCYTESUR SMALL (A) 07/25/2019 1805   Sepsis Labs: Invalid input(s): PROCALCITONIN, LACTICIDVEN  Microbiology: Recent Results (from the past 240 hour(s))  MRSA PCR Screening     Status: None   Collection Time: 07/27/19  2:42 PM   Specimen: Nasopharyngeal  Result Value Ref Range Status   MRSA  by PCR NEGATIVE NEGATIVE Final    Comment:        The GeneXpert MRSA Assay (FDA approved for NASAL specimens only), is one component of a comprehensive MRSA colonization surveillance program. It is not intended to diagnose MRSA infection nor to guide or monitor treatment for MRSA infections. Performed at [facility], [address] [place], [place] [address]   CSF culture     Status: None   Collection Time: 07/30/19 12:35 PM   Specimen: PATH Cytology CSF; Cerebrospinal Fluid   Result Value Ref Range Status   Specimen Description   Final    CSF Performed at [facility], [address], [place], [place] [address]    Special Requests   Final    NONE Performed at [facility], [address] [place], [place] [address]    Gram Stain   Final    WBC PRESENT, PREDOMINANTLY PMN NO ORGANISMS SEEN CYTOSPIN SMEAR    Culture   Final    NO GROWTH 3 DAYS Performed at [facility], [address], [place], [place] [address]    Report Status 08/03/2019 FINAL  Final  Culture, fungus without smear     Status: None (Preliminary result)   Collection Time: 07/30/19 12:35 PM   Specimen: PATH Cytology CSF; Cerebrospinal Fluid  Result Value Ref Range Status   Specimen Description   Final    CSF Performed at [facility], [address] [place], [place] [address]    Special Requests   Final    NONE Performed at [facility], [address] [place], [place] [address]    Culture   Final    NO FUNGUS ISOLATED AFTER 7 DAYS Performed at [facility], [address], [place], [place] [address]    Report Status PENDING  Incomplete    Radiology Studies: No results found.    Nihar Klus T. Daisuke Bailey [facility]  If 7PM-7AM, please contact night-coverage [email or web address] Password [email or web address] 08/06/2019, 11:02 AM

## 2019-08-07 DIAGNOSIS — M4646 Discitis, unspecified, lumbar region: Secondary | ICD-10-CM

## 2019-08-07 LAB — RENAL FUNCTION PANEL
Albumin: 3.1 g/dL — ABNORMAL LOW (ref 3.5–5.0)
Anion gap: 8 (ref 5–15)
BUN: 7 mg/dL (ref 6–20)
CO2: 24 mmol/L (ref 22–32)
Calcium: 8.4 mg/dL — ABNORMAL LOW (ref 8.9–10.3)
Chloride: 100 mmol/L (ref 98–111)
Creatinine, Ser: 0.65 mg/dL (ref 0.61–1.24)
GFR calc Af Amer: >60 mL/min (ref >60)
GFR calc non Af Amer: >60 mL/min (ref >60)
Glucose, Bld: 101 mg/dL — ABNORMAL HIGH (ref 70–99)
Phosphorus: 3.6 mg/dL (ref 2.5–4.6)
Potassium: 3.7 mmol/L (ref 3.5–5.1)
Sodium: 132 mmol/L — ABNORMAL LOW (ref 135–145)

## 2019-08-07 LAB — GLUCOSE, CAPILLARY
Glucose-Capillary: 109 mg/dL — ABNORMAL HIGH (ref 70–99)
Glucose-Capillary: 99 mg/dL (ref 70–99)
Glucose-Capillary: 115 mg/dL — ABNORMAL HIGH (ref 70–99)
Glucose-Capillary: 104 mg/dL — ABNORMAL HIGH (ref 70–99)

## 2019-08-07 LAB — CBC
HCT: 30.8 % — ABNORMAL LOW (ref 39.0–52.0)
Hemoglobin: 9.4 g/dL — ABNORMAL LOW (ref 13.0–17.0)
MCH: 24.1 pg — ABNORMAL LOW (ref 26.0–34.0)
MCHC: 30.5 g/dL (ref 30.0–36.0)
MCV: 79 fL — ABNORMAL LOW (ref 80.0–100.0)
Platelets: 517 10*3/uL — ABNORMAL HIGH (ref 150–400)
RBC: 3.9 MIL/uL — ABNORMAL LOW (ref 4.22–5.81)
RDW: 17.4 % — ABNORMAL HIGH (ref 11.5–15.5)
WBC: 11.1 10*3/uL — ABNORMAL HIGH (ref 4.0–10.5)
nRBC: 0 % (ref 0.0–0.2)

## 2019-08-07 LAB — MAGNESIUM: Magnesium: 2.1 mg/dL (ref 1.7–2.4)

## 2019-08-07 LAB — VANCOMYCIN, TROUGH: Vancomycin Tr: 13 ug/mL — ABNORMAL LOW (ref 15–20)

## 2019-08-07 LAB — HSV DNA BY PCR (REFERENCE LAB)
HSV 1 DNA: NEGATIVE
HSV 2 DNA: NEGATIVE

## 2019-08-07 MED ORDER — OXYCODONE HCL ER 10 MG PO T12A
10.0000 mg | EXTENDED_RELEASE_TABLET | Freq: Two times a day (BID) | ORAL | Status: DC
  Administered 2019-08-07 – 2019-09-02 (×53): 10 mg via ORAL
  Filled 2019-08-07 (×53): qty 1

## 2019-08-07 MED ORDER — POLYETHYLENE GLYCOL 3350 17 G PO PACK
17.0000 g | PACK | Freq: Every day | ORAL | Status: DC | PRN
  Administered 2019-08-09 – 2019-08-13 (×4): 17 g via ORAL
  Filled 2019-08-07 (×4): qty 1

## 2019-08-07 MED ORDER — SENNOSIDES-DOCUSATE SODIUM 8.6-50 MG PO TABS
2.0000 | ORAL_TABLET | Freq: Every evening | ORAL | Status: DC | PRN
  Administered 2019-08-08 – 2019-08-12 (×3): 2 via ORAL
  Filled 2019-08-07 (×3): qty 2

## 2019-08-07 MED ORDER — SODIUM CHLORIDE 0.9 % IV SOLN
2.0000 g | INTRAVENOUS | Status: AC
  Administered 2019-08-08 – 2019-09-05 (×29): 2 g via INTRAVENOUS
  Filled 2019-08-07 (×17): qty 2
  Filled 2019-08-07: qty 20
  Filled 2019-08-07: qty 2
  Filled 2019-08-07: qty 20
  Filled 2019-08-07 (×3): qty 2
  Filled 2019-08-07: qty 20
  Filled 2019-08-07 (×5): qty 2

## 2019-08-07 NOTE — Progress Notes (Addendum)
PHARMACY CONSULT NOTE FOR:  OUTPATIENT  PARENTERAL ANTIBIOTIC THERAPY (OPAT)  Indication: vertebral osteomyelitis Regimen: Vancomycin 1500mg  IV q12h  And ceftriaxone 2gm IV q24h End date: 09/05/2019  IV antibiotic discharge orders are pended. To discharging provider:  please sign these orders via discharge navigator,  Select New Orders & click on the button choice - Manage This Unsigned Work.     Thank you for allowing pharmacy to be a part of this patient's care.  Doreene Eland, PharmD, BCPS.   Work Cell: [phone] 08/07/2019 9:08 AM

## 2019-08-07 NOTE — Plan of Care (Signed)
Plan of care reviewed and discussed with the patient. 

## 2019-08-07 NOTE — Care Management Important Message (Signed)
Important Message  Patient Details IM Letter given to Dessa Phi RN Case Manager to present to the Patient Name: KALOBE BATMAN MRN: [id number] Date of Birth: 23-Jan-1966   Medicare Important Message Given:  Yes     Kerin Salen 08/07/2019, 12:41 PM

## 2019-08-07 NOTE — TOC Progression Note (Signed)
Transition of Care [facility]) - Progression Note    Patient Details  Name: [name] MRN: [id number] Date of Birth: 1965-06-15  Transition of Care [facility] CM/SW Contact  Izamar Linden, Juliann Pulse, RN Phone Number: 08/07/2019, 2:08 PM  Clinical Narrative:  PT recc SNF-patient declines SNF;wants home. Hx: IVDA.polysubstance abuse. Explored long term iv abx-noted not a candidate for home iv abx. Will explore IVDU transition to otpt w/PICC lock device w/team vs stay in hospital until iv abx completed.      Expected Discharge Plan: [place] Barriers to Discharge: Continued Medical Work up  Expected Discharge Plan and Services Expected Discharge Plan: [place]   Discharge Planning Services: CM Consult Post Acute Care Choice: [place] arrangements for the past 2 months: Apartment                                       Social Determinants of Health (SDOH) Interventions    Readmission Risk Interventions No flowsheet data found.

## 2019-08-07 NOTE — Progress Notes (Signed)
PROGRESS NOTE    [name]  XLK:[id number] DOB: 1965/10/24 DOA: 07/25/2019 PCP: [name], MD   Brief Narrative:  54 year old gentleman with past medical history remarkable for HTN, CVA, GERD, depression/anxiety, chronic pain syndrome, EtOH dependence, history of polysubstance abuse including alcohol, PCP, heroin, cocaine who was brought in by EMS after being found on the sidewalk with increased heart rate and respiratory rate.  Admitted for polysubstance abuse, encephalopathy.  Initially requiring Precedex drip went to the ICU for agitation.Due to delay in LP, neurology recommending 14 days of IV antibiotics.  Due to low back pain he had CT lumbar spine which showed concerns of osteomyelitis/discitis which was confirmed on the MRI spine.  ID was consulted who recommended total of 6 weeks of vancomycin and Rocephin.  He still has 4 more weeks left, last dose being 6/8.  Assessment & Plan:   Active Problems:   Nicotine dependence, cigarettes, uncomplicated   Toxic metabolic encephalopathy   Polysubstance overdose   Acute kidney injury (HCC)   Chronic pain syndrome   SIRS (systemic inflammatory response syndrome) (HCC)   Delirium   Altered mental status   Acute metabolic encephalopathy: Resolved Resolved.  Mentating well at baseline.  Vitamin B12 deficiency Aggressive repletion.  Continue B12 1000 mcg subcu x1 week followed by 2000 mcg p.o. daily; recommend repeat B12 level 1 month  Bacterial meningitis Already getting antibiotics vancomycin and Rocephin for lumbar osteomyelitis/discitis. --HSV: Negative. --VDRL = neg --CSF fungal culture: No growth to date --CSF culture: No growth to date --MR brain with and without contrast: Normal --EEG: Normal --Seen by neurology and infectious disease.  Lumbar osteomyelitis/discitis. Continue vancomycin/Rocephin until 6/8.  Seen by infectious disease. OxyContin 10 mg twice daily.  Oxycodone 5 mg every 6 hours as needed.  Avoid  use of IV narcotics.  Polysubstance abuse with EtOH, amphetamine, opiates, benzodiazepine Hx PCP, cocaine, heroin abuse Off Precedex drip.  Continue folic acid, thiamine.  Essential hypertension Sinus tachycardia Currently on Cardizem 240 mg daily.  Losartan 50 mg daily.  Chronic pain syndrome Gabapentin twice daily.  Oral pain medications as mentioned above.  GERD: continue PPI  Depression/anxiety: Bupropion daily.  Ativan as needed  Weakness: --PT evaluation-home health  Patient will need PICC line for long-term IV antibiotic use.  He will require PICC line lock for this which our case manager will help arrange for.  If we are not able to do this, patient will remain inpatient for IV antibiotic treatment given previous history of drug use.  DVT prophylaxis: Lovenox Code Status: Full code Family Communication: Spoke with [name])- Father.   Status is: Inpatient  Remains inpatient appropriate because:Ongoing active pain requiring inpatient pain management.  Currently on IV anti-infectives awaiting culture data.   Dispo: The patient is from: Home              Anticipated d/c is to: Home              Anticipated d/c date is: > 3 days              Patient currently is medically stable to d/c.  Maintain hospital stay to complete his IV antibiotic course, unsafe for discharge with PICC line given his significant amount of polysubstance abuse.  Subjective: Still reporting of lower back pain is expected.  No other issues at this time.  Review of Systems Otherwise negative except as per HPI, including: General: Denies fever, chills, night sweats or unintended weight loss. Resp: Denies cough,  wheezing, shortness of breath. Cardiac: Denies chest pain, palpitations, orthopnea, paroxysmal nocturnal dyspnea. GI: Denies abdominal pain, nausea, vomiting, diarrhea or constipation GU: Denies dysuria, frequency, hesitancy or incontinence MS: Lower back pain Neuro:  Denies headache, neurologic deficits (focal weakness, numbness, tingling), abnormal gait Psych: Denies anxiety, depression, SI/HI/AVH Skin: Denies new rashes or lesions ID: Denies sick contacts, exotic exposures, travel  Examination: Constitutional: Not in acute distress Respiratory: Clear to auscultation bilaterally Cardiovascular: Normal sinus rhythm, no rubs Abdomen: Nontender nondistended good bowel sounds Musculoskeletal: No edema noted Skin: No rashes seen Neurologic: CN 2-12 grossly intact.  And nonfocal Psychiatric: Normal judgment and insight. Alert and oriented x 3. Normal mood.  Objective: Vitals:   08/06/19 1602 08/06/19 2048 08/07/19 0523 08/07/19 0927  BP: 114/86 (!) 133/95 111/75 115/70  Pulse: (!) 103 (!) 104 (!) 106   Resp: 19 19 18    Temp: 98.2 F (36.8 C) 98 F (36.7 C) 98.5 F (36.9 C)   TempSrc: Oral Oral Oral   SpO2: 99% 99% 99%   Weight:      Height:        Intake/Output Summary (Last 24 hours) at 08/07/2019 1135 Last data filed at 08/07/2019 0615 Gross per 24 hour  Intake 1240 ml  Output 600 ml  Net 640 ml   Filed Weights   07/26/19 1858  Weight: 94.3 kg     Data Reviewed:   CBC: Recent Labs  Lab 08/03/19 0306 08/04/19 0424 08/05/19 0330 08/06/19 0323 08/07/19 0322  WBC 9.3 11.8* 10.8* 12.6* 11.1*  HGB 9.1* 9.3* 9.8* 9.5* 9.4*  HCT 29.9* 29.9* 31.9* 31.0* 30.8*  MCV 78.1* 78.1* 78.8* 78.1* 79.0*  PLT 475* 489* 521* 516* 517*   Basic Metabolic Panel: Recent Labs  Lab 08/03/19 0306 08/04/19 0424 08/05/19 0330 08/06/19 0323 08/07/19 0322  NA 133* 131* 138 131* 132*  K 3.7 3.2* 4.1 3.2* 3.7  CL 99 97* 105 98 100  CO2 23 24 25 23 24   GLUCOSE 101* 131* 101* 130* 101*  BUN 5* 7 6 6 7   CREATININE 0.59* 0.69 0.63 0.62 0.65  CALCIUM 8.4* 8.6* 8.7* 8.6* 8.4*  MG 1.7 1.6* 2.1 1.6* 2.1  PHOS  --   --  3.4 2.9 3.6   GFR: Estimated Creatinine Clearance: 121.7 mL/min (by C-G formula based on SCr of 0.65 mg/dL). Liver Function  Tests: Recent Labs  Lab 08/01/19 0212 08/01/19 0212 08/03/19 0306 08/04/19 0424 08/05/19 0330 08/06/19 0323 08/07/19 0322  AST 22  --  16 16  --   --   --   ALT 13  --  10 10  --   --   --   ALKPHOS 158*  --  145* 147*  --   --   --   BILITOT 0.4  --  0.4 0.3  --   --   --   PROT 6.6  --  6.6 6.7  --   --   --   ALBUMIN 2.9*   < > 3.1* 3.1* 3.3* 3.2* 3.1*   < > = values in this interval not displayed.   No results for input(s): LIPASE, AMYLASE in the last 168 hours. No results for input(s): AMMONIA in the last 168 hours. Coagulation Profile: No results for input(s): INR, PROTIME in the last 168 hours. Cardiac Enzymes: No results for input(s): CKTOTAL, CKMB, CKMBINDEX, TROPONINI in the last 168 hours. BNP (last 3 results) No results for input(s): PROBNP in the last 8760 hours. HbA1C: No results  for input(s): HGBA1C in the last 72 hours. CBG: Recent Labs  Lab 08/06/19 0757 08/06/19 1144 08/06/19 1638 08/06/19 2049 08/07/19 0724  GLUCAP 115* 103* 101* 112* 109*   Lipid Profile: No results for input(s): CHOL, HDL, LDLCALC, TRIG, CHOLHDL, LDLDIRECT in the last 72 hours. Thyroid Function Tests: No results for input(s): TSH, T4TOTAL, FREET4, T3FREE, THYROIDAB in the last 72 hours. Anemia Panel: Recent Labs    08/06/19 1248  VITAMINB12 >7,500*  FOLATE 28.6  FERRITIN 50  TIBC 350  IRON 15*  RETICCTPCT 1.6   Sepsis Labs: No results for input(s): PROCALCITON, LATICACIDVEN in the last 168 hours.  Recent Results (from the past 240 hour(s))  CSF culture     Status: None   Collection Time: 07/30/19 12:35 PM   Specimen: PATH Cytology CSF; Cerebrospinal Fluid  Result Value Ref Range Status   Specimen Description   Final    CSF Performed at [facility], [address] [place], [place] [address]    Special Requests   Final    NONE Performed at [facility], [address] [place], [place] [address]    Gram Stain   Final     WBC PRESENT, PREDOMINANTLY PMN NO ORGANISMS SEEN CYTOSPIN SMEAR    Culture   Final    NO GROWTH 3 DAYS Performed at [facility], [address] [place], [place] [address]    Report Status 08/03/2019 FINAL  Final  Culture, fungus without smear     Status: None (Preliminary result)   Collection Time: 07/30/19 12:35 PM   Specimen: PATH Cytology CSF; Cerebrospinal Fluid  Result Value Ref Range Status   Specimen Description   Final    CSF Performed at [facility], [address], [name], [place] [address]    Special Requests   Final    NONE Performed at [facility], [address], [place], [place] [address]    Culture   Final    NO FUNGUS ISOLATED AFTER 8 DAYS Performed at [facility], [address] [place], [place] [address]    Report Status PENDING  Incomplete         Radiology Studies: No results found.      Scheduled Meds: . acetaminophen  1,000 mg Oral Q8H  . benztropine  1 mg Oral Daily  . buPROPion  300 mg Oral Daily  . Chlorhexidine Gluconate Cloth  6 each Topical Daily  . Chlorhexidine Gluconate Cloth  6 each Topical Daily  . diltiazem  240 mg Oral Daily  . enoxaparin (LOVENOX) injection  40 mg Subcutaneous Daily  . folic acid  1 mg Oral Daily  . gabapentin  300 mg Oral BID  . ibuprofen  800 mg Oral Once  . insulin aspart  0-5 Units Subcutaneous QHS  . insulin aspart  0-9 Units Subcutaneous TID WC  . losartan  50 mg Oral Daily  . oxyCODONE  10 mg Oral Q12H  . pantoprazole  40 mg Oral Daily  . thiamine  250 mg Oral Daily  . vitamin B-12  2,000 mcg Oral Daily   Continuous Infusions: . sodium chloride Stopped (07/28/19 1838)  . [START ON 08/08/2019] cefTRIAXone (ROCEPHIN)  IV    . vancomycin 1,500 mg (08/07/19 0936)     LOS: 12 days   Time spent= 40 mins    Brian Lindenbaum Joline Maxcy, MD [facility]  If 7PM-7AM, please contact night-coverage  08/07/2019, 11:35 AM

## 2019-08-07 NOTE — Progress Notes (Signed)
Pharmacy Antibiotic Note  [name] is a 53 y.o. male admitted on 07/25/2019 with altered mental status. Patient placed on Acyclovir, Vancomycin, Ampicillin, and Ceftriaxone for meningitis. Antibiotics discontinued earlier today. Resumed this at recommendation of Neurology. As of 5/7 plan per ID, to continue vancomycin and rocephin for 6 weeks total - last dose on 6/8 for discitis.    Today, 08/07/19  Day 10/42 Vanc/Rocephin  WBC 11  SCr stable 0.65  Vanc Trough 13  Plan:  Vanc trough just below goal of 15-20 mcg/mL (was 16 on 5/3) - Continue vanc 1500mg  IV q12 for now and would recommend to recheck a trough at facility in next few days to confirm dose still appropriate   Height: 5\' 10"  (177.8 cm) Weight: 94.3 kg (207 lb 14.3 oz) IBW/kg (Calculated) : 73  Temp (24hrs), Avg:98.2 F (36.8 C), Min:98 F (36.7 C), Max:98.5 F (36.9 C)  Recent Labs  Lab 07/31/19 2129 08/01/19 0212 08/03/19 0306 08/04/19 0424 08/05/19 0330 08/06/19 0323 08/07/19 0322 08/07/19 0929  WBC  --    < > 9.3 11.8* 10.8* 12.6* 11.1*  --   CREATININE  --    < > 0.59* 0.69 0.63 0.62 0.65  --   VANCOTROUGH 16  --   --   --   --   --   --  13*   < > = values in this interval not displayed.    Estimated Creatinine Clearance: 121.7 mL/min (by C-G formula based on SCr of 0.65 mg/dL).    No Known Allergies  Antimicrobials this admission: 4/28 Ceftriaxone x 1, 4/30 >> 4/28 Doxycycline >> 4/28 4/29 Ampicillin/sulbactam >> 4/30 4/30 Ampicillin>> 5/3 AM, resumed 5/3 PM >> 5/10 4/30 Acyclovir >> 5/7 4/30 Vancomycin >>    Microbiology results: 4/27 Influenza A/B: neg, Covid: neg 4/27 RPR: nonreactive 4/27 HIV: nonreactive 4/27 BCx: ngF 4/27 UCx: ngF  4/29 MRSA PCR: neg 5/2 CSF: gram stain neg,  ngF 5/2 CSF fungal: ngtd 5/2 CSF HSV: ip 5/2 CSF VDRL: ip   Thank you for allowing pharmacy to be a part of this patient's care.    Adrian Saran, PharmD, BCPS 08/07/2019 10:07 AM

## 2019-08-07 NOTE — Plan of Care (Signed)
  Problem: Education: Goal: Knowledge of General Education information will improve Description: Including pain rating scale, medication(s)/side effects and non-pharmacologic comfort measures Outcome: Progressing   Problem: Health Behavior/Discharge Planning: Goal: Ability to manage health-related needs will improve Outcome: Progressing   Problem: Clinical Measurements: Goal: Ability to maintain clinical measurements within normal limits will improve Outcome: Progressing Goal: Will remain free from infection Outcome: Progressing Goal: Diagnostic test results will improve Outcome: Progressing Goal: Respiratory complications will improve Outcome: Progressing Goal: Cardiovascular complication will be avoided Outcome: Progressing   Problem: Activity: Goal: Risk for activity intolerance will decrease Outcome: Progressing   Problem: Nutrition: Goal: Adequate nutrition will be maintained Outcome: Progressing   Problem: Coping: Goal: Level of anxiety will decrease Outcome: Progressing   Problem: Elimination: Goal: Will not experience complications related to bowel motility Outcome: Progressing   Problem: Pain Managment: Goal: General experience of comfort will improve Outcome: Progressing   Problem: Safety: Goal: Ability to remain free from injury will improve Outcome: Progressing   

## 2019-08-07 NOTE — Progress Notes (Signed)
Spoke with [name], the patient's fiance, for an update on the patient's current status and d/c plans.  She expressed concerns over the patient being labeled as a 'drug addict' or 'alcoholic' and stated repeatedly that "it's been years since he's drank" and "he doesn't use drugs, I live with him and I'd know if he did."  I explained to her the need for IV abx until 6/8 and the possibility of SNF in order to safely accomplish this treatment goal.  She stated that she felt the patient wouldn't agree to SNF and would do better going home with home health.  She requested that I leave a message for the MD to call her tomorrow to update her on the plan and provide more information on his diagnosis.  I will leave a sticky not for the MD for Monday morning.

## 2019-08-08 LAB — RENAL FUNCTION PANEL
Albumin: 3.3 g/dL — ABNORMAL LOW (ref 3.5–5.0)
Anion gap: 10 (ref 5–15)
BUN: 7 mg/dL (ref 6–20)
CO2: 23 mmol/L (ref 22–32)
Calcium: 8.7 mg/dL — ABNORMAL LOW (ref 8.9–10.3)
Chloride: 99 mmol/L (ref 98–111)
Creatinine, Ser: 0.68 mg/dL (ref 0.61–1.24)
GFR calc Af Amer: >60 mL/min (ref >60)
GFR calc non Af Amer: >60 mL/min (ref >60)
Glucose, Bld: 105 mg/dL — ABNORMAL HIGH (ref 70–99)
Phosphorus: 4.2 mg/dL (ref 2.5–4.6)
Potassium: 3.4 mmol/L — ABNORMAL LOW (ref 3.5–5.1)
Sodium: 132 mmol/L — ABNORMAL LOW (ref 135–145)

## 2019-08-08 LAB — BASIC METABOLIC PANEL
Anion gap: 11 (ref 5–15)
BUN: 7 mg/dL (ref 6–20)
CO2: 22 mmol/L (ref 22–32)
Calcium: 8.7 mg/dL — ABNORMAL LOW (ref 8.9–10.3)
Chloride: 102 mmol/L (ref 98–111)
Creatinine, Ser: 0.71 mg/dL (ref 0.61–1.24)
GFR calc Af Amer: >60 mL/min (ref >60)
GFR calc non Af Amer: >60 mL/min (ref >60)
Glucose, Bld: 136 mg/dL — ABNORMAL HIGH (ref 70–99)
Potassium: 3.1 mmol/L — ABNORMAL LOW (ref 3.5–5.1)
Sodium: 135 mmol/L (ref 135–145)

## 2019-08-08 LAB — GLUCOSE, CAPILLARY
Glucose-Capillary: 103 mg/dL — ABNORMAL HIGH (ref 70–99)
Glucose-Capillary: 133 mg/dL — ABNORMAL HIGH (ref 70–99)
Glucose-Capillary: 76 mg/dL (ref 70–99)
Glucose-Capillary: 82 mg/dL (ref 70–99)

## 2019-08-08 MED ORDER — POTASSIUM CHLORIDE CRYS ER 20 MEQ PO TBCR
40.0000 meq | EXTENDED_RELEASE_TABLET | ORAL | Status: AC
  Administered 2019-08-08 (×2): 40 meq via ORAL
  Filled 2019-08-08 (×2): qty 2

## 2019-08-08 MED ORDER — NICOTINE 21 MG/24HR TD PT24
21.0000 mg | MEDICATED_PATCH | Freq: Every day | TRANSDERMAL | Status: DC
  Administered 2019-08-08 – 2019-09-05 (×29): 21 mg via TRANSDERMAL
  Filled 2019-08-08 (×30): qty 1

## 2019-08-08 NOTE — Progress Notes (Signed)
Occupational Therapy Treatment Patient Details Name: [name] MRN: [id number] DOB: November 16, 1965 Today's Date: 08/08/2019    History of present illness 54 yo male admitted with toxic metabolic encephalopathy, bacterial meningitis, unwitness fall 5/2. Hx of recent lumbar sg L3-L5 03/2019, polysubstance abuse, schizophrenia, ETOH abuse, CVA, bipolar d/o, back pain, L ankle fusion 2019, R TKA 2018, asthma.   OT comments  Patient progressing towards acute goals, mild impulsivity noted with managing rollator however no physical assist needed. Patient agreeable to seated g/h at edge of bed, reports back is hurting and would prefer sitting. Will continue to follow.   Follow Up Recommendations  No OT follow up;Other (comment)(patient declining SNF)          Precautions / Restrictions Precautions Precautions: Fall Precaution Comments: lumbar sg 03/2019 Restrictions Weight Bearing Restrictions: No       Mobility Bed Mobility Overal bed mobility: Needs Assistance Bed Mobility: Rolling;Supine to Sit;Sit to Supine Rolling: Supervision   Supine to sit: Supervision Sit to supine: Min guard   General bed mobility comments: educate in log roll technique for comfort, pt reports did not minimize back pain. min guard back to bed for safety  Transfers Overall transfer level: Needs assistance Equipment used: 4-wheeled walker Transfers: Sit to/from Stand Sit to Stand: Supervision         General transfer comment: verbal cues for sequencing safe use of rollator (use of brakes during sit <> stand)    Balance Overall balance assessment: Needs assistance;History of Falls Sitting-balance support: No upper extremity supported;Feet supported Sitting balance-[name]: Good     Standing balance support: Bilateral upper extremity supported;During functional activity Standing balance-[name]: Poor                             ADL either performed or assessed with clinical  judgement   ADL Overall ADL's : Needs assistance/impaired     Grooming: Brushing hair;Set up;Sitting                   Toilet Transfer: Ambulation;Supervision/safety(rollator) Toilet Transfer Details (indicate cue type and reason): simulated with functional mobility, mild impulsivity with turning 4WW no physical assist needed         Functional mobility during ADLs: Supervision/safety(rollator)                 Cognition Arousal/Alertness: Awake/alert Behavior During Therapy: WFL for tasks assessed/performed Overall Cognitive Status: Within Functional Limits for tasks assessed                                 General Comments: mild impulsivity, no physical assist needed for balance during ambulation                   Pertinent Vitals/ Pain       Pain Assessment: Faces Faces Pain Scale: Hurts little more Pain Location: back Pain Descriptors / Indicators: Aching;Sore;Discomfort Pain Intervention(s): Limited activity within patient's tolerance;Heat applied         Frequency  Min 2X/week        Progress Toward Goals  OT Goals(current goals can now be found in the care plan section)  Progress towards OT goals: Progressing toward goals  Acute Rehab OT Goals Patient Stated Goal: to get better OT Goal Formulation: With patient Time For Goal Achievement: 08/16/19 Potential to Achieve Goals: Good ADL Goals Pt Will Perform Grooming:  standing;with modified independence Pt Will Perform Lower Body Dressing: sit to/from stand;with modified independence Pt Will Transfer to Toilet: with modified independence;ambulating;bedside commode;grab bars Pt Will Perform Toileting - Clothing Manipulation and hygiene: with modified independence;sit to/from stand Pt Will Perform Tub/Shower Transfer: with supervision;shower seat;ambulating Additional ADL Goal #1: Patient will demonstrate improving balance by ambulating in room without LOB with DME with  supervisoin only  Plan Discharge plan remains appropriate       AM-PAC OT "6 Clicks" Daily Activity     Outcome Measure   Help from another person eating meals?: None Help from another person taking care of personal grooming?: A Little Help from another person toileting, which includes using toliet, bedpan, or urinal?: A Little Help from another person bathing (including washing, rinsing, drying)?: A Little Help from another person to put on and taking off regular upper body clothing?: None Help from another person to put on and taking off regular lower body clothing?: A Little 6 Click Score: 20    End of Session Equipment Utilized During Treatment: Other (comment)(rollator )  OT Visit Diagnosis: Unsteadiness on feet (R26.81);Pain Pain - part of body: (back)   Activity Tolerance Patient tolerated treatment well;Patient limited by pain   Patient Left in bed;with call bell/phone within reach;with nursing/sitter in room   Nurse Communication Mobility status        Time: 5409-8119 OT Time Calculation (min): 12 min  Charges: OT General Charges $OT Visit: 1 Visit OT Treatments $Self Care/Home Management : 8-22 mins  Marlyce Huge OT Pager: [phone]   Carmelia Roller 08/08/2019, 12:46 PM

## 2019-08-08 NOTE — Progress Notes (Signed)
Patient's blood sugar noted to be 76. Patient declined a snack and stated he has dinner ordered. Patient alert and oriented. Patient requested and given a regular soda.

## 2019-08-08 NOTE — Progress Notes (Signed)
PT Cancellation Note  Patient Details Name: [name] MRN: [id number] DOB: [birth year]   Cancelled Treatment:    Reason Eval/Treat Not Completed: Patient declined, no reason specified    Doreatha Massed, PT Acute Rehabilitation

## 2019-08-08 NOTE — Consult Note (Signed)
[facility] Consult   Reason for Consult:  "Need Med consolidation. On Invega [place] 819mg " Referring Physician:  Dr. Reesa Chew Patient Identification: HORALD BAJO MRN:  [id number] Principal Diagnosis: <principal problem not specified> Diagnosis:  Active Problems:   Nicotine dependence, cigarettes, uncomplicated   Toxic metabolic encephalopathy   Polysubstance overdose   Acute kidney injury ([place])   Chronic pain syndrome   SIRS (systemic inflammatory response syndrome) (HCC)   Delirium   Altered mental status   Total Time spent with patient: 20 minutes  Subjective:   [name] is a 54 y.o. male patient. Patient assessed by nurse practitioner. Patient alert and oriented, answers appropriately. Patient resting upon my approach. Patient denies suicidal and homicidal ideations. Patient reports history of three previous suicide attempts, last attempt approximately 13 years ago. Patient denies symptoms of paranoia. Patient denies visual hallucinations. Patient denies auditory hallucinations currently, patient reports "I hear voices sometimes, I have for years." Patient denies command hallucinations.  Patient reports he lives with fiance in [place]. Patient denies access to weapons. Patient currently receives disability benefits. Patient followed by [facility] team x approx 9 years. Patient reports ACT team visits him in his home twice weekly.  Patient reports history of alcohol use disorder, reports stopped alcohol use approx "3 years ago." Patient reports "I use cocaine but not too often."  Patient gives verbal consent to speak with [facility] team regarding Invega dosage. ACT team [phone] or [phone]. Spoke with ACT team, Abby who reports Invega 819mg  Q3 months due today, 08/08/2019.  HPI:  Patient admitted with toxic metabolic encephalopathy, bacterial meningitis and unwitnessed fall.  Past Psychiatric History: Polysubstance use, Cocaine use, PTSD,  Schizophrenia  Risk to Self:  Denies Risk to Others:  Denies Prior Inpatient Therapy:   [facility] Prior Outpatient Therapy:  [facility]  Past Medical History:  Past Medical History:  Diagnosis Date  . Anxiety   . Arthritis   . Asthma   . Depression   . Duodenal adenoma   . GERD (gastroesophageal reflux disease)   . Hypertension   . Pneumonia    hx  . Post-traumatic osteoarthritis of left ankle   . Pre-diabetes   . Presence of right artificial knee joint 12/17/2016  . Schizophrenia ([place])   . Stroke [facility])    2009-or10    Past Surgical History:  Procedure Laterality Date  . ANKLE ARTHROSCOPY Left 10/20/2017   Procedure: ANKLE ARTHROSCOPY;  Surgeon: Newt Minion, MD;  Location: [facility];  Service: Orthopedics;  Laterality: Left;  . ANKLE FUSION Left 10/20/2017  . ANKLE FUSION Left 10/20/2017   Procedure: LEFT ANKLE FUSION;  Surgeon: Newt Minion, MD;  Location: [facility];  Service: Orthopedics;  Laterality: Left;  . ESOPHAGOGASTRODUODENOSCOPY Left 12/05/2013   Procedure: ESOPHAGOGASTRODUODENOSCOPY (EGD);  Surgeon: Arta Silence, MD;  Location[facility];  Service: Endoscopy;  Laterality: Left;  . ESOPHAGOGASTRODUODENOSCOPY (EGD) WITH PROPOFOL N/A 02/20/2015   Procedure: ESOPHAGOGASTRODUODENOSCOPY (EGD) WITH PROPOFOL;  Surgeon: Wilford Corner, MD;  Location: [facility];  Service: Endoscopy;  Laterality: N/A;  . HARDWARE REMOVAL Left 01/29/2018   Procedure: HARDWARE REMOVAL LEFT ANKLE;  Surgeon: Newt Minion, MD;  Location: [facility];  Service: Orthopedics;  Laterality: Left;  . JOINT REPLACEMENT    . TOTAL KNEE ARTHROPLASTY Right 07/21/2016   Procedure: TOTAL KNEE ARTHROPLASTY;  Surgeon: Meredith Pel, MD;  Location: [facility];  Service: Orthopedics;  Laterality: Right;  . UPPER GASTROINTESTINAL ENDOSCOPY     Family  History:  Family History  Adopted: Yes  Problem Relation Age of Onset  . Colon cancer Neg Hx   . Esophageal cancer Neg Hx   . Rectal cancer Neg Hx   .  Stomach cancer Neg Hx    Family Psychiatric  History: unknown Social History:  Social History   Substance and Sexual Activity  Alcohol Use Not Currently  . Alcohol/week: 3.0 standard drinks  . Types: 3 Cans of beer per week   Comment: quit 2015; formerly up to 2 fifths/day     Social History   Substance and Sexual Activity  Drug Use No    Social History   Socioeconomic History  . Marital status: Significant Other    Spouse name: Not on file  . Number of children: Not on file  . Years of education: Not on file  . Highest education level: Not on file  Occupational History  . Not on file  Tobacco Use  . Smoking status: Heavy Tobacco Smoker    Packs/day: 1.50    Years: 38.00    Pack years: 57.00    Types: Cigarettes  . Smokeless tobacco: Never Used  . Tobacco comment: onset age 31; upto 1.5 ppd  Substance and Sexual Activity  . Alcohol use: Not Currently    Alcohol/week: 3.0 standard drinks    Types: 3 Cans of beer per week    Comment: quit 2015; formerly up to 2 fifths/day  . Drug use: No  . Sexual activity: Yes    Comment: declined condoms  Other Topics Concern  . Not on file  Social History Narrative  . Not on file   Social Determinants of Health   Financial Resource Strain:   . Difficulty of Paying Living Expenses:   Food Insecurity:   . Worried About Charity fundraiser in the Last Year:   . Arboriculturist in the Last Year:   Transportation Needs:   . Film/video editor (Medical):   Marland Kitchen Lack of Transportation (Non-Medical):   Physical Activity:   . Days of Exercise per Week:   . Minutes of Exercise per Session:   Stress:   . Feeling of Stress :   Social Connections:   . Frequency of Communication with Friends and Family:   . Frequency of Social Gatherings with Friends and Family:   . Attends Religious Services:   . Active Member of Clubs or Organizations:   . Attends Archivist Meetings:   Marland Kitchen Marital Status:    Additional Social  History:    Allergies:  No Known Allergies  Labs:  Results for orders placed or performed during the hospital encounter of 07/25/19 (from the past 48 hour(s))  Glucose, capillary     Status: Abnormal   Collection Time: 08/06/19  4:38 PM  Result Value Ref Range   Glucose-Capillary 101 (H) 70 - 99 mg/dL    Comment: Glucose reference range applies only to samples taken after fasting for at least 8 hours.  Glucose, capillary     Status: Abnormal   Collection Time: 08/06/19  8:49 PM  Result Value Ref Range   Glucose-Capillary 112 (H) 70 - 99 mg/dL    Comment: Glucose reference range applies only to samples taken after fasting for at least 8 hours.  CBC     Status: Abnormal   Collection Time: 08/07/19  3:22 AM  Result Value Ref Range   WBC 11.1 (H) 4.0 - 10.5 K/uL   RBC 3.90 (L) 4.22 -  5.81 MIL/uL   Hemoglobin 9.4 (L) 13.0 - 17.0 g/dL   HCT 30.8 (L) 39.0 - 52.0 %   MCV 79.0 (L) 80.0 - 100.0 fL   MCH 24.1 (L) 26.0 - 34.0 pg   MCHC 30.5 30.0 - 36.0 g/dL   RDW 17.4 (H) 11.5 - 15.5 %   Platelets 517 (H) 150 - 400 K/uL   nRBC 0.0 0.0 - 0.2 %    Comment: Performed at [facility], [place] [address] [place], [place] [address]  Magnesium     Status: None   Collection Time: 08/07/19  3:22 AM  Result Value Ref Range   Magnesium 2.1 1.7 - 2.4 mg/dL    Comment: Performed at [facility], [place] [address] [place], [place] [address]  Renal function panel     Status: Abnormal   Collection Time: 08/07/19  3:22 AM  Result Value Ref Range   Sodium 132 (L) 135 - 145 mmol/L   Potassium 3.7 3.5 - 5.1 mmol/L   Chloride 100 98 - 111 mmol/L   CO2 24 22 - 32 mmol/L   Glucose, Bld 101 (H) 70 - 99 mg/dL    Comment: Glucose reference range applies only to samples taken after fasting for at least 8 hours.   BUN 7 6 - 20 mg/dL   Creatinine, Ser 0.65 0.61 - 1.24 mg/dL   Calcium 8.4 (L) 8.9 - 10.3 mg/dL   Phosphorus 3.6 2.5 - 4.6 mg/dL   Albumin 3.1 (L) 3.5 - 5.0 g/dL    GFR calc non Af Amer >60 >60 mL/min   GFR calc Af Amer >60 >60 mL/min   Anion gap 8 5 - 15    Comment: Performed at [facility], [place] [address] [place], [place] [address]  Glucose, capillary     Status: Abnormal   Collection Time: 08/07/19  7:24 AM  Result Value Ref Range   Glucose-Capillary 109 (H) 70 - 99 mg/dL    Comment: Glucose reference range applies only to samples taken after fasting for at least 8 hours.  Vancomycin, trough     Status: Abnormal   Collection Time: 08/07/19  9:29 AM  Result Value Ref Range   Vancomycin Tr 13 (L) 15 - 20 ug/mL    Comment: Performed at [facility], [place] [address], [place], [place] [address]  Glucose, capillary     Status: None   Collection Time: 08/07/19 11:45 AM  Result Value Ref Range   Glucose-Capillary 99 70 - 99 mg/dL    Comment: Glucose reference range applies only to samples taken after fasting for at least 8 hours.  Glucose, capillary     Status: Abnormal   Collection Time: 08/07/19  4:49 PM  Result Value Ref Range   Glucose-Capillary 115 (H) 70 - 99 mg/dL    Comment: Glucose reference range applies only to samples taken after fasting for at least 8 hours.  Glucose, capillary     Status: Abnormal   Collection Time: 08/07/19  8:30 PM  Result Value Ref Range   Glucose-Capillary 104 (H) 70 - 99 mg/dL    Comment: Glucose reference range applies only to samples taken after fasting for at least 8 hours.  Renal function panel     Status: Abnormal   Collection Time: 08/08/19  3:53 AM  Result Value Ref Range   Sodium 132 (L) 135 - 145 mmol/L   Potassium 3.4 (L) 3.5 - 5.1 mmol/L   Chloride 99  98 - 111 mmol/L   CO2 23 22 - 32 mmol/L   Glucose, Bld 105 (H) 70 - 99 mg/dL    Comment: Glucose reference range applies only to samples taken after fasting for at least 8 hours.   BUN 7 6 - 20 mg/dL   Creatinine, Ser 0.68 0.61 - 1.24 mg/dL   Calcium 8.7 (L) 8.9 - 10.3 mg/dL   Phosphorus 4.2 2.5 - 4.6 mg/dL    Albumin 3.3 (L) 3.5 - 5.0 g/dL   GFR calc non Af Amer >60 >60 mL/min   GFR calc Af Amer >60 >60 mL/min   Anion gap 10 5 - 15    Comment: Performed at [facility], [place] [address] [place], [place] [address]  Glucose, capillary     Status: Abnormal   Collection Time: 08/08/19  7:27 AM  Result Value Ref Range   Glucose-Capillary 103 (H) 70 - 99 mg/dL    Comment: Glucose reference range applies only to samples taken after fasting for at least 8 hours.  Basic metabolic panel     Status: Abnormal   Collection Time: 08/08/19  9:56 AM  Result Value Ref Range   Sodium 135 135 - 145 mmol/L   Potassium 3.1 (L) 3.5 - 5.1 mmol/L   Chloride 102 98 - 111 mmol/L   CO2 22 22 - 32 mmol/L   Glucose, Bld 136 (H) 70 - 99 mg/dL    Comment: Glucose reference range applies only to samples taken after fasting for at least 8 hours.   BUN 7 6 - 20 mg/dL   Creatinine, Ser 0.71 0.61 - 1.24 mg/dL   Calcium 8.7 (L) 8.9 - 10.3 mg/dL   GFR calc non Af Amer >60 >60 mL/min   GFR calc Af Amer >60 >60 mL/min   Anion gap 11 5 - 15    Comment: Performed at [facility], [place] [address], [place], [place] [address]  Glucose, capillary     Status: Abnormal   Collection Time: 08/08/19 10:59 AM  Result Value Ref Range   Glucose-Capillary 133 (H) 70 - 99 mg/dL    Comment: Glucose reference range applies only to samples taken after fasting for at least 8 hours.    Current Facility-Administered Medications  Medication Dose Route Frequency Provider Last Rate Last Admin  . 0.9 %  sodium chloride infusion   Intravenous PRN Terrilee Croak, MD   Stopped at 07/28/19 1838  . acetaminophen (TYLENOL) tablet 1,000 mg  1,000 mg Oral Q8H Wendee Beavers T, MD   1,000 mg at 08/08/19 0459  . albuterol (PROVENTIL) (2.5 MG/3ML) 0.083% nebulizer solution 2.5 mg  2.5 mg Nebulization BID PRN Dahal, Marlowe Aschoff, MD      . benztropine (COGENTIN) tablet 1 mg  1 mg Oral Daily Dahal, Binaya, MD   1 mg at 08/08/19 0915   . buPROPion (WELLBUTRIN XL) 24 hr tablet 300 mg  300 mg Oral Daily Erick Colace, NP   300 mg at 08/08/19 0915  . cefTRIAXone (ROCEPHIN) 2 g in sodium chloride 0.9 % 100 mL IVPB  2 g Intravenous Q24H Berton Mount, RPH 200 mL/hr at 08/08/19 0913 2 g at 08/08/19 0913  . Chlorhexidine Gluconate Cloth 2 % PADS 6 each  6 each Topical Daily Damita Lack, MD   6 each at 08/06/19 0935  . Chlorhexidine Gluconate Cloth 2 % PADS 6 each  6 each Topical Daily Damita Lack, MD   6 each at 08/07/19  [id number]  . diltiazem (CARDIZEM CD) 24 hr capsule 240 mg  240 mg Oral Daily Wendee Beavers T, MD   240 mg at 08/08/19 0915  . enoxaparin (LOVENOX) injection 40 mg  40 mg Subcutaneous Daily Rigoberto Noel, MD   40 mg at 08/08/19 0917  . folic acid (FOLVITE) tablet 1 mg  1 mg Oral Daily Leodis Sias T, RPH   1 mg at 08/08/19 0914  . gabapentin (NEURONTIN) capsule 300 mg  300 mg Oral BID Erick Colace, NP   300 mg at 08/08/19 0915  . ibuprofen (ADVIL) tablet 800 mg  800 mg Oral Once Anders Simmonds, MD   Stopped at 08/01/19 0300  . insulin aspart (novoLOG) injection 0-5 Units  0-5 Units Subcutaneous QHS Anders Simmonds, MD      . insulin aspart (novoLOG) injection 0-9 Units  0-9 Units Subcutaneous TID WC Anders Simmonds, MD   1 Units at 08/08/19 1208  . labetalol (NORMODYNE) injection 10 mg  10 mg Intravenous Q4H PRN [place] Donnamarie Poag, DO      . LORazepam (ATIVAN) injection 0.5 mg  0.5 mg Intravenous Q6H PRN Gonfa, Taye T, MD      . losartan (COZAAR) tablet 50 mg  50 mg Oral Daily Olalere, Adewale A, MD   50 mg at 08/08/19 0916  . metoprolol tartrate (LOPRESSOR) injection 5 mg  5 mg Intravenous Q5 min PRN Lang Snow, FNP   5 mg at 08/01/19 2239  . Muscle Rub CREA   Topical PRN Bunnie Pion Z, DO      . nicotine (NICODERM CQ - dosed in mg/24 hours) patch 21 mg  21 mg Transdermal Daily Amin, Ankit Chirag, MD   21 mg at 08/08/19 1208  . ondansetron (ZOFRAN) tablet 4 mg  4 mg Oral Q6H PRN Shalhoub,  Sherryll Burger, MD   4 mg at 08/08/19 1032   Or  . ondansetron (ZOFRAN) injection 4 mg  4 mg Intravenous Q6H PRN Shalhoub, Sherryll Burger, MD   4 mg at 08/07/19 0748  . oxyCODONE (Oxy IR/ROXICODONE) immediate release tablet 5 mg  5 mg Oral Q6H PRN Wendee Beavers T, MD   5 mg at 08/08/19 1032  . oxyCODONE (OXYCONTIN) 12 hr tablet 10 mg  10 mg Oral Q12H Amin, Ankit Chirag, MD   10 mg at 08/08/19 0915  . pantoprazole (PROTONIX) EC tablet 40 mg  40 mg Oral Daily Olalere, Adewale A, MD   40 mg at 08/08/19 0915  . polyethylene glycol (MIRALAX / GLYCOLAX) packet 17 g  17 g Oral Daily PRN Amin, Ankit Chirag, MD      . potassium chloride SA (KLOR-CON) CR tablet 40 mEq  40 mEq Oral Q4H Amin, Ankit Chirag, MD   40 mEq at 08/08/19 0915  . senna-docusate (Senokot-S) tablet 2 tablet  2 tablet Oral QHS PRN Amin, Ankit Chirag, MD      . sodium chloride flush (NS) 0.9 % injection 10-40 mL  10-40 mL Intracatheter PRN Amin, Ankit Chirag, MD      . thiamine tablet 250 mg  250 mg Oral Daily Wendee Beavers T, MD   250 mg at 08/08/19 0914  . vancomycin (VANCOREADY) IVPB 1500 mg/300 mL  1,500 mg Intravenous Q12H Luiz Ochoa, RPH 150 mL/hr at 08/08/19 0910 1,500 mg at 08/08/19 0910  . vitamin B-12 (CYANOCOBALAMIN) tablet 2,000 mcg  2,000 mcg Oral Daily [place] Donnamarie Poag, DO   2,000 mcg at 08/08/19 [phone]  Musculoskeletal: Strength & Muscle Tone: within normal limits Gait & Station: unable to assess Patient leans: N/A  Psychiatric Specialty Exam: Physical Exam  Nursing note and vitals reviewed. Constitutional: He is oriented to person, place, and time. He appears well-developed.  HENT:  Head: Normocephalic.  Cardiovascular: Normal rate.  Respiratory: Effort normal.  Musculoskeletal:     Cervical back: Normal range of motion.  Neurological: He is alert and oriented to person, place, and time.  Psychiatric: He has a normal mood and affect. His speech is normal and behavior is normal. Judgment and thought content normal.  Cognition and memory are normal.    Review of Systems  Constitutional: Negative.   HENT: Negative.   Eyes: Negative.   Respiratory: Negative.   Cardiovascular: Negative.   Gastrointestinal: Negative.   Genitourinary: Negative.   Musculoskeletal: Negative.   Skin: Negative.   Neurological: Negative.   Psychiatric/Behavioral: Negative.     Blood pressure 123/87, pulse (!) 107, temperature 98.3 F (36.8 C), temperature source Oral, resp. rate 17, height 5\' 10"  (1.778 m), weight 94.3 kg, SpO2 100 %.Body mass index is 29.83 kg/m.  General Appearance: Casual and Fairly Groomed  Eye Contact:  Good  Speech:  Clear and Coherent and Normal Rate  Volume:  Normal  Mood:  Euthymic  Affect:  Appropriate and Congruent  Thought Process:  Coherent, Goal Directed and Descriptions of Associations: Intact  Orientation:  Full (Time, Place, and Person)  Thought Content:  WDL and Logical  Suicidal Thoughts:  No  Homicidal Thoughts:  No  Memory:  Immediate;   Good Recent;   Good Remote;   Good  Judgement:  Good  Insight:  Good  Psychomotor Activity:  Normal  Concentration:  Concentration: Good and Attention Span: Good  Recall:  Good  Fund of Knowledge:  Good  Language:  Good  Akathisia:  No  Handed:  Right  AIMS (if indicated):     Assets:  Communication Skills Desire for Improvement Financial Resources/Insurance Housing Intimacy Leisure Time Physical Health Resilience Social Support  ADL's:    Cognition:  WNL  Sleep:        Treatment Plan Summary: Patient discussed with Dr Dwyane Dee. Per patient ACT team patient stable on Invega 814mg  IM Q3 months. Invega Q 3 month dosing can be administered two weeks before or after dosage due date.  Medication management  Invega 814mg  IM due today, 08/08/2019- recommend administer Invega on 05/13.2021.   Disposition: No evidence of imminent risk to self or others at present.   Patient does not meet criteria for psychiatric inpatient  admission. Supportive therapy provided about ongoing stressors. Discussed crisis plan, support from social network, calling 911, coming to the Emergency Department, and calling Suicide Hotline.  Emmaline Kluver, FNP 08/08/2019 1:54 PM

## 2019-08-08 NOTE — Progress Notes (Addendum)
PROGRESS NOTE    [name]  NFA:[id number] DOB: [birth year] DOA: 07/25/2019 PCP: [name], MD   Brief Narrative:  54 year old gentleman with past medical history remarkable for HTN, CVA, GERD, depression/anxiety, chronic pain syndrome, EtOH dependence, history of polysubstance abuse including alcohol, PCP, heroin, cocaine who was brought in by EMS after being found on the sidewalk with increased heart rate and respiratory rate.  Admitted for polysubstance abuse, encephalopathy.  Initially requiring Precedex drip went to the ICU for agitation.Due to delay in LP, neurology recommending 14 days of IV antibiotics.  Due to low back pain he had CT lumbar spine which showed concerns of osteomyelitis/discitis which was confirmed on the MRI spine.  ID was consulted who recommended total of 6 weeks of vancomycin and Rocephin.  He still has 4 more weeks left, last dose being 6/8.  Poor candidate for outpatient/unsupervised environment given his drug abuse history.  Assessment & Plan:   Active Problems:   Nicotine dependence, cigarettes, uncomplicated   Toxic metabolic encephalopathy   Polysubstance overdose   Acute kidney injury (HCC)   Chronic pain syndrome   SIRS (systemic inflammatory response syndrome) (HCC)   Delirium   Altered mental status   Acute metabolic encephalopathy: Resolved Resolved.  Vitamin B12 deficiency Aggressive repletion.  Continue B12 1000 mcg subcu x1 week followed by 2000 mcg p.o. daily; recommend repeat B12 level 1 month  Bacterial meningitis, improved. Already getting antibiotics vancomycin and Rocephin for lumbar osteomyelitis/discitis. --HSV: Negative. --VDRL = neg --CSF fungal culture: No growth to date --CSF culture: No growth to date --MR brain with and without contrast: Normal --EEG: Normal --Seen by neurology and infectious disease.  Lumbar osteomyelitis/discitis. Continue vancomycin/Rocephin until 6/8.  Seen by infectious  disease. OxyContin 10 mg twice daily.  Oxycodone 5 mg every 6 hours as needed.  Avoid use of IV narcotics. Poor candidate for outpatient treatment with PICC line in place getting lack of social support and drug use history.  Polysubstance abuse with EtOH, amphetamine, opiates, benzodiazepine Hx PCP, cocaine, heroin abuse Continue folic acid and thiamine  Essential hypertension Sinus tachycardia Currently on Cardizem 240 mg daily.  Losartan 50 mg daily.  Chronic pain syndrome Gabapentin twice daily.  Oral pain medications as mentioned above.  GERD: continue PPI  Depression/anxiety: Bupropion daily.  Ativan as needed  Patient on Monthly Invenga sustenna 819mg . I will consult psych for appropriate med rec while in hospital  Weakness: --PT evaluation-home health  Poor candidate for outpatient PICC line program with [facility] due to lack of transportation.  Spoke with Dr. Ilsa Iha.  TOC team will work on finding placement for him if not will remain in the hospital.  DVT prophylaxis: Lovenox Code Status: Full code Family Communication: Updated patient's father and mother.  Status is: Inpatient  Remains inpatient appropriate because:Ongoing active pain requiring inpatient pain management.  Currently on IV anti-infectives    Dispo: The patient is from: Home              Anticipated d/c is to: Home              Anticipated d/c date is: > 3 days              Patient currently is medically stable to d/c.  TOC team working on safe disposition, if unable to find a place he will remain in the hospital until completion of his anti-infectives  Subjective: No complaints, he is requiring about IV Dilaudid.  I explained to him that we  are going to adjust p.o. medication and chronic conditions.  Review of Systems Otherwise negative except as per HPI, including: General: Denies fever, chills, night sweats or unintended weight loss. Resp: Denies cough, wheezing, shortness of breath. Cardiac:  Denies chest pain, palpitations, orthopnea, paroxysmal nocturnal dyspnea. GI: Denies abdominal pain, nausea, vomiting, diarrhea or constipation GU: Denies dysuria, frequency, hesitancy or incontinence MS: Denies muscle aches, joint pain or swelling Neuro: Denies headache, neurologic deficits (focal weakness, numbness, tingling), abnormal gait Psych: Denies anxiety, depression, SI/HI/AVH Skin: Denies new rashes or lesions ID: Denies sick contacts, exotic exposures, travel   Examination: Constitutional: Not in acute distress Respiratory: Clear to auscultation bilaterally Cardiovascular: Normal sinus rhythm, no rubs Abdomen: Nontender nondistended good bowel sounds Musculoskeletal: No edema noted Skin: No rashes seen Neurologic: CN 2-12 grossly intact.  And nonfocal Psychiatric: Normal judgment and insight. Alert and oriented x 3. Normal mood. Objective: Vitals:   08/07/19 0927 08/07/19 1209 08/07/19 2033 08/08/19 0500  BP: 115/70 (!) 135/93 130/90 115/85  Pulse:  (!) 105 (!) 106 98  Resp:   17 18  Temp:  98 F (36.7 C) 99.5 F (37.5 C) 98.5 F (36.9 C)  TempSrc:  Oral Oral Oral  SpO2:  97% 98% 99%  Weight:      Height:        Intake/Output Summary (Last 24 hours) at 08/08/2019 0834 Last data filed at 08/07/2019 2200 Gross per 24 hour  Intake 710.23 ml  Output 400 ml  Net 310.23 ml   Filed Weights   07/26/19 1858  Weight: 94.3 kg     Data Reviewed:   CBC: Recent Labs  Lab 08/03/19 0306 08/04/19 0424 08/05/19 0330 08/06/19 0323 08/07/19 0322  WBC 9.3 11.8* 10.8* 12.6* 11.1*  HGB 9.1* 9.3* 9.8* 9.5* 9.4*  HCT 29.9* 29.9* 31.9* 31.0* 30.8*  MCV 78.1* 78.1* 78.8* 78.1* 79.0*  PLT 475* 489* 521* 516* 517*   Basic Metabolic Panel: Recent Labs  Lab 08/03/19 0306 08/03/19 0306 08/04/19 0424 08/05/19 0330 08/06/19 0323 08/07/19 0322 08/08/19 0353  NA 133*   < > 131* 138 131* 132* 132*  K 3.7   < > 3.2* 4.1 3.2* 3.7 3.4*  CL 99   < > 97* 105 98 100 99   CO2 23   < > 24 25 23 24 23   GLUCOSE 101*   < > 131* 101* 130* 101* 105*  BUN 5*   < > 7 6 6 7 7   CREATININE 0.59*   < > 0.69 0.63 0.62 0.65 0.68  CALCIUM 8.4*   < > 8.6* 8.7* 8.6* 8.4* 8.7*  MG 1.7  --  1.6* 2.1 1.6* 2.1  --   PHOS  --   --   --  3.4 2.9 3.6 4.2   < > = values in this interval not displayed.   GFR: Estimated Creatinine Clearance: 121.7 mL/min (by C-G formula based on SCr of 0.68 mg/dL). Liver Function Tests: Recent Labs  Lab 08/03/19 0306 08/03/19 0306 08/04/19 0424 08/05/19 0330 08/06/19 0323 08/07/19 0322 08/08/19 0353  AST 16  --  16  --   --   --   --   ALT 10  --  10  --   --   --   --   ALKPHOS 145*  --  147*  --   --   --   --   BILITOT 0.4  --  0.3  --   --   --   --  PROT 6.6  --  6.7  --   --   --   --   ALBUMIN 3.1*   < > 3.1* 3.3* 3.2* 3.1* 3.3*   < > = values in this interval not displayed.   No results for input(s): LIPASE, AMYLASE in the last 168 hours. No results for input(s): AMMONIA in the last 168 hours. Coagulation Profile: No results for input(s): INR, PROTIME in the last 168 hours. Cardiac Enzymes: No results for input(s): CKTOTAL, CKMB, CKMBINDEX, TROPONINI in the last 168 hours. BNP (last 3 results) No results for input(s): PROBNP in the last 8760 hours. HbA1C: No results for input(s): HGBA1C in the last 72 hours. CBG: Recent Labs  Lab 08/07/19 0724 08/07/19 1145 08/07/19 1649 08/07/19 2030 08/08/19 0727  GLUCAP 109* 99 115* 104* 103*   Lipid Profile: No results for input(s): CHOL, HDL, LDLCALC, TRIG, CHOLHDL, LDLDIRECT in the last 72 hours. Thyroid Function Tests: No results for input(s): TSH, T4TOTAL, FREET4, T3FREE, THYROIDAB in the last 72 hours. Anemia Panel: Recent Labs    08/06/19 1248  VITAMINB12 >7,500*  FOLATE 28.6  FERRITIN 50  TIBC 350  IRON 15*  RETICCTPCT 1.6   Sepsis Labs: No results for input(s): PROCALCITON, LATICACIDVEN in the last 168 hours.  Recent Results (from the past 240 hour(s))   CSF culture     Status: None   Collection Time: 07/30/19 12:35 PM   Specimen: PATH Cytology CSF; Cerebrospinal Fluid  Result Value Ref Range Status   Specimen Description   Final    CSF Performed at [facility], [address], [place], [place] [address]    Special Requests   Final    NONE Performed at [facility], [address] [place], [place] [address]    Gram Stain   Final    WBC PRESENT, PREDOMINANTLY PMN NO ORGANISMS SEEN CYTOSPIN SMEAR    Culture   Final    NO GROWTH 3 DAYS Performed at [facility], [address], [place], [place] [address]    Report Status 08/03/2019 FINAL  Final  Culture, fungus without smear     Status: None (Preliminary result)   Collection Time: 07/30/19 12:35 PM   Specimen: PATH Cytology CSF; Cerebrospinal Fluid  Result Value Ref Range Status   Specimen Description   Final    CSF Performed at [facility], [address] [place], [place] [address]    Special Requests   Final    NONE Performed at [facility], [address], [place], [place] [address]    Culture   Final    NO FUNGUS ISOLATED AFTER 8 DAYS Performed at [facility], [address] [place], [place] [address]    Report Status PENDING  Incomplete         Radiology Studies: No results found.      Scheduled Meds: . acetaminophen  1,000 mg Oral Q8H  . benztropine  1 mg Oral Daily  . buPROPion  300 mg Oral Daily  . Chlorhexidine Gluconate Cloth  6 each Topical Daily  . Chlorhexidine Gluconate Cloth  6 each Topical Daily  . diltiazem  240 mg Oral Daily  . enoxaparin (LOVENOX) injection  40 mg Subcutaneous Daily  . folic acid  1 mg Oral Daily  . gabapentin  300 mg Oral BID  . ibuprofen  800 mg Oral Once  . insulin aspart  0-5 Units Subcutaneous QHS  . insulin aspart  0-9 Units Subcutaneous TID WC  . losartan  50 mg Oral Daily  . oxyCODONE  10 mg Oral Q12H  .  pantoprazole  40 mg Oral Daily  . potassium chloride  40 mEq Oral Q4H  . thiamine  250 mg Oral Daily  . vitamin B-12  2,000 mcg Oral Daily   Continuous Infusions: . sodium chloride Stopped (07/28/19 1838)  . cefTRIAXone (ROCEPHIN)  IV    . vancomycin 1,500 mg (08/07/19 2139)     LOS: 13 days   Time spent= 40 mins    Searra Carnathan Joline Maxcy, MD [facility]  If 7PM-7AM, please contact night-coverage  08/08/2019, 8:34 AM

## 2019-08-09 LAB — RENAL FUNCTION PANEL
Albumin: 3.2 g/dL — ABNORMAL LOW (ref 3.5–5.0)
Anion gap: 10 (ref 5–15)
BUN: 7 mg/dL (ref 6–20)
CO2: 22 mmol/L (ref 22–32)
Calcium: 8.5 mg/dL — ABNORMAL LOW (ref 8.9–10.3)
Chloride: 99 mmol/L (ref 98–111)
Creatinine, Ser: 0.63 mg/dL (ref 0.61–1.24)
GFR calc Af Amer: >60 mL/min (ref >60)
GFR calc non Af Amer: >60 mL/min (ref >60)
Glucose, Bld: 100 mg/dL — ABNORMAL HIGH (ref 70–99)
Phosphorus: 3.5 mg/dL (ref 2.5–4.6)
Potassium: 3.7 mmol/L (ref 3.5–5.1)
Sodium: 131 mmol/L — ABNORMAL LOW (ref 135–145)

## 2019-08-09 LAB — GLUCOSE, CAPILLARY
Glucose-Capillary: 93 mg/dL (ref 70–99)
Glucose-Capillary: 85 mg/dL (ref 70–99)
Glucose-Capillary: 98 mg/dL (ref 70–99)
Glucose-Capillary: 106 mg/dL — ABNORMAL HIGH (ref 70–99)

## 2019-08-09 MED ORDER — PALIPERIDONE PALMITATE ER 234 MG/1.5ML IM SUSY: 814.0000 mg | PREFILLED_SYRINGE | Freq: Once | INTRAMUSCULAR | Status: DC

## 2019-08-09 MED ORDER — ALUM & MAG HYDROXIDE-SIMETH 200-200-20 MG/5ML PO SUSP
30.0000 mL | ORAL | Status: DC | PRN
  Administered 2019-08-09: 30 mL via ORAL
  Filled 2019-08-09: qty 30

## 2019-08-09 MED ORDER — PALIPERIDONE PALMITATE ER 234 MG/1.5ML IM SUSY
234.0000 mg | PREFILLED_SYRINGE | INTRAMUSCULAR | Status: DC
  Administered 2019-08-10: 234 mg via INTRAMUSCULAR
  Filled 2019-08-09: qty 1.5

## 2019-08-09 NOTE — Progress Notes (Signed)
Physical Therapy Treatment Patient Details Name: [name] MRN: [id number] DOB: 1965-07-05 Today's Date: 08/09/2019    History of Present Illness 54 yo male admitted with toxic metabolic encephalopathy, bacterial meningitis, unwitness fall 5/2. Hx of recent lumbar sg L3-L5 03/2019, polysubstance abuse, schizophrenia, ETOH abuse, CVA, bipolar d/o, back pain, L ankle fusion 2019, R TKA 2018, asthma.    PT Comments    Pt reports back pain 10/10. Pt initially refused therapy, but agreeable to do bed exercises. Pt able to complete LE exercises with no visible sign of increased back pain. Pt reports he does ambulate to the BR and in the halls with the nursing staff. Pt did require assistance last time we evaluated him on 08/02/19 to prevent a fall. Therefore depending on assistance available at home recommend SNF or 24 hour supervision if plan if for home with family.   Follow Up Recommendations  SNF;Supervision/Assistance - 24 hour     Equipment Recommendations  None recommended by PT    Recommendations for Other Services       Precautions / Restrictions Precautions Precautions: Fall Precaution Comments: lumbar sg 03/2019 Restrictions Weight Bearing Restrictions: No    Mobility  Bed Mobility                  Transfers                    Ambulation/Gait                 Stairs             Wheelchair Mobility    Modified Rankin (Stroke Patients Only)       Balance                                            Cognition Arousal/Alertness: Awake/alert Behavior During Therapy: WFL for tasks assessed/performed Overall Cognitive Status: Within Functional Limits for tasks assessed                                 General Comments: Pt stated his back hurt too much to walk. Pt agreed to bed exercises. Pt reported he ambulates to the BR and in the hall with the nursing staff.      Exercises General Exercises - Lower  Extremity Ankle Circles/Pumps: AROM;Strengthening;Both;10 reps;Supine Quad Sets: AROM;Strengthening;Both;10 reps;Supine Heel Slides: AROM;Strengthening;Both;10 reps Hip ABduction/ADduction: AROM;Strengthening;Both;10 reps;Supine Straight Leg Raises: AROM;Strengthening;Both;10 reps;Supine    General Comments        Pertinent Vitals/Pain Pain Assessment: 0-10 Pain Score: 10-Worst pain ever Pain Location: back Pain Descriptors / Indicators: Aching;Sore;Discomfort Pain Intervention(s): Limited activity within patient's tolerance;Monitored during session;Premedicated before session    Home Living                      Prior Function            PT Goals (current goals can now be found in the care plan section) Progress towards PT goals: Not progressing toward goals - comment(pt refusing ambulation due to back pain)    Frequency    Min 2X/week      PT Plan Current plan remains appropriate    Co-evaluation              AM-PAC PT "6 Clicks" Mobility  Outcome Measure  Help needed turning from your back to your side while in a flat bed without using bedrails?: A Little Help needed moving from lying on your back to sitting on the side of a flat bed without using bedrails?: A Little Help needed moving to and from a bed to a chair (including a wheelchair)?: A Little Help needed standing up from a chair using your arms (e.g., wheelchair or bedside chair)?: A Little Help needed to walk in hospital room?: A Little Help needed climbing 3-5 steps with a railing? : A Little 6 Click Score: 18    End of Session   Activity Tolerance: Patient tolerated treatment well Patient left: in bed;with bed alarm set;with call bell/phone within reach Nurse Communication: Mobility status PT Visit Diagnosis: History of falling (Z91.81);Difficulty in walking, not elsewhere classified (R26.2);Pain;Muscle weakness (generalized) (M62.81)     Time: 1191-4782 PT Time Calculation (min)  (ACUTE ONLY): 14 min  Charges:  $Therapeutic Exercise: 8-22 mins                      Greggory Stallion 08/09/2019, 9:58 AM

## 2019-08-09 NOTE — Progress Notes (Signed)
PROGRESS NOTE  [name] OZH:[id number] DOB: 08-Feb-1966 DOA: 07/25/2019 PCP: [name], MD   LOS: 14 days   Brief Narrative / Interim history: 54 year old male with HTN, prior CVA, GERD, depression/anxiety, chronic pain syndrome, EtOH, polysubstance abuse including PCP, heroin, cocaine came into the hospital after being out on the street, he was tachycardic and tachypneic on admission.  He was also encephalopathic and required ICU admission for Precedex.  There was concern about persistent encephalopathy and possibility of bacterial meningitis, ID consulted and recommended 14 days of antibiotics.  He also had a CT of the L-spine which showed osteomyelitis/discitis which was confirmed.  ID recommending 6 total weeks of ceftriaxone and vancomycin, with last dose being on 6/8.  Due to substance abuse history he is not a candidate for home PICC line  Subjective / 24h Interval events: Complains of back pain, feels perhaps a little bit worse.  Denies any fever or chills.  No abdominal pain, nausea or vomiting.  Assessment & Plan: Principal Problem Lumbar discitis/osteomyelitis-infectious is consulted, recommended vancomycin and ceftriaxone up until 6/8.  He has been on OxyContin 10 mg p.o. daily as well as oxycodone every 6, avoid use of IV narcotics.  He is not a candidate for outpatient PICC line due to lack of social support and drug use history and unless TOC can secure an SNF bed he will need to remain hospitalized  Active Problems Acute metabolic encephalopathy-resolved  Vitamin B12 deficiency-continue IM B12 weekly  Polysubstance abuse with EtOH, amphetamine, opiates, benzodiazepine, PCP, cocaine, heroine-will need cessation.  Continue nicotine patch  Essential hypertension-continue Cardizem, losartan  Depression/anxiety /polysubstance abuse and history of schizophrenia-patient on  Invega sustenna every 3 months, psych evaluated patient recommending to administer it tomorrow on  5/13.  Scheduled Meds: . acetaminophen  1,000 mg Oral Q8H  . benztropine  1 mg Oral Daily  . buPROPion  300 mg Oral Daily  . Chlorhexidine Gluconate Cloth  6 each Topical Daily  . Chlorhexidine Gluconate Cloth  6 each Topical Daily  . diltiazem  240 mg Oral Daily  . enoxaparin (LOVENOX) injection  40 mg Subcutaneous Daily  . folic acid  1 mg Oral Daily  . gabapentin  300 mg Oral BID  . ibuprofen  800 mg Oral Once  . insulin aspart  0-5 Units Subcutaneous QHS  . insulin aspart  0-9 Units Subcutaneous TID WC  . losartan  50 mg Oral Daily  . nicotine  21 mg Transdermal Daily  . oxyCODONE  10 mg Oral Q12H  . pantoprazole  40 mg Oral Daily  . thiamine  250 mg Oral Daily  . vitamin B-12  2,000 mcg Oral Daily   Continuous Infusions: . sodium chloride Stopped (07/28/19 1838)  . cefTRIAXone (ROCEPHIN)  IV 2 g (08/09/19 1038)  . vancomycin 1,500 mg (08/09/19 1037)   PRN Meds:.sodium chloride, albuterol, alum & mag hydroxide-simeth, labetalol, LORazepam, metoprolol tartrate, Muscle Rub, ondansetron **OR** ondansetron (ZOFRAN) IV, oxyCODONE, polyethylene glycol, senna-docusate, sodium chloride flush  DVT prophylaxis: Lovenox Code Status: Full code Family Communication: No family present at bedside  Status is: Inpatient  Remains inpatient appropriate because:IV treatments appropriate due to intensity of illness or inability to take PO   Dispo: The patient is from: Home              Anticipated d/c is to: Home              Anticipated d/c date is: > 3 days  Patient currently is not medically stable to d/c.  Consultants:  ID PCCM  Procedures:  None   Microbiology  None   Antimicrobials: Vancomycin / Ceftriaxone     Objective: Vitals:   08/08/19 0500 08/08/19 1236 08/08/19 2011 08/09/19 0602  BP: 115/85 123/87 (!) 138/96 115/79  Pulse: 98 (!) 107 (!) 104 (!) 105  Resp: 18 17 18 17   Temp: 98.5 F (36.9 C) 98.3 F (36.8 C) 98.4 F (36.9 C) 98.7 F (37.1 C)   TempSrc: Oral Oral Oral Oral  SpO2: 99% 100% 100% 100%  Weight:      Height:        Intake/Output Summary (Last 24 hours) at 08/09/2019 1102 Last data filed at 08/09/2019 1000 Gross per 24 hour  Intake 470 ml  Output 400 ml  Net 70 ml   Filed Weights   07/26/19 1858  Weight: 94.3 kg    Examination:  Constitutional: NAD Eyes: no scleral icterus ENMT: Mucous membranes are moist.  Neck: normal, supple Respiratory: clear to auscultation bilaterally, no wheezing, no crackles. Cardiovascular: Regular rate and rhythm, no murmurs / rubs / gallops. No LE edema.  Abdomen: non distended, no tenderness. Bowel sounds positive.  Musculoskeletal: no clubbing / cyanosis.  Skin: no rashes Neurologic: non focal   Data Reviewed: I have independently reviewed following labs and imaging studies   CBC: Recent Labs  Lab 08/03/19 0306 08/04/19 0424 08/05/19 0330 08/06/19 0323 08/07/19 0322  WBC 9.3 11.8* 10.8* 12.6* 11.1*  HGB 9.1* 9.3* 9.8* 9.5* 9.4*  HCT 29.9* 29.9* 31.9* 31.0* 30.8*  MCV 78.1* 78.1* 78.8* 78.1* 79.0*  PLT 475* 489* 521* 516* 517*   Basic Metabolic Panel: Recent Labs  Lab 08/03/19 0306 08/03/19 0306 08/04/19 0424 08/04/19 0424 08/05/19 0330 08/05/19 0330 08/06/19 0323 08/07/19 0322 08/08/19 0353 08/08/19 0956 08/09/19 0324  NA 133*   < > 131*   < > 138   < > 131* 132* 132* 135 131*  K 3.7   < > 3.2*   < > 4.1   < > 3.2* 3.7 3.4* 3.1* 3.7  CL 99   < > 97*   < > 105   < > 98 100 99 102 99  CO2 23   < > 24   < > 25   < > 23 24 23 22 22   GLUCOSE 101*   < > 131*   < > 101*   < > 130* 101* 105* 136* 100*  BUN 5*   < > 7   < > 6   < > 6 7 7 7 7   CREATININE 0.59*   < > 0.69   < > 0.63   < > 0.62 0.65 0.68 0.71 0.63  CALCIUM 8.4*   < > 8.6*   < > 8.7*   < > 8.6* 8.4* 8.7* 8.7* 8.5*  MG 1.7  --  1.6*  --  2.1  --  1.6* 2.1  --   --   --   PHOS  --   --   --   --  3.4  --  2.9 3.6 4.2  --  3.5   < > = values in this interval not displayed.   Liver Function  Tests: Recent Labs  Lab 08/03/19 1914 08/03/19 7829 08/04/19 0424 08/04/19 0424 08/05/19 0330 08/06/19 0323 08/07/19 0322 08/08/19 0353 08/09/19 0324  AST 16  --  16  --   --   --   --   --   --  ALT 10  --  10  --   --   --   --   --   --   ALKPHOS 145*  --  147*  --   --   --   --   --   --   BILITOT 0.4  --  0.3  --   --   --   --   --   --   PROT 6.6  --  6.7  --   --   --   --   --   --   ALBUMIN 3.1*   < > 3.1*   < > 3.3* 3.2* 3.1* 3.3* 3.2*   < > = values in this interval not displayed.   Coagulation Profile: No results for input(s): INR, PROTIME in the last 168 hours. HbA1C: No results for input(s): HGBA1C in the last 72 hours. CBG: Recent Labs  Lab 08/08/19 0727 08/08/19 1059 08/08/19 1619 08/08/19 2200 08/09/19 0724  GLUCAP 103* 133* 76 82 93    Recent Results (from the past 240 hour(s))  CSF culture     Status: None   Collection Time: 07/30/19 12:35 PM   Specimen: PATH Cytology CSF; Cerebrospinal Fluid  Result Value Ref Range Status   Specimen Description   Final    CSF Performed at [facility], [address] [place], [place] [address]    Special Requests   Final    NONE Performed at [facility], [address] [place], [place] [address]    Gram Stain   Final    WBC PRESENT, PREDOMINANTLY PMN NO ORGANISMS SEEN CYTOSPIN SMEAR    Culture   Final    NO GROWTH 3 DAYS Performed at [facility], [address], [place], [place] [address]    Report Status 08/03/2019 FINAL  Final  Culture, fungus without smear     Status: None (Preliminary result)   Collection Time: 07/30/19 12:35 PM   Specimen: PATH Cytology CSF; Cerebrospinal Fluid  Result Value Ref Range Status   Specimen Description   Final    CSF Performed at [facility], [address], [place], [place] [address]    Special Requests   Final    NONE Performed at [facility], [address]  [place], [place] [address]    Culture   Final    NO FUNGUS ISOLATED AFTER 10 DAYS Performed at [facility], [address] [place], [place] [address]    Report Status PENDING  Incomplete     Radiology Studies: No results found.  Pamella Pert, MD, PhD [facility]  Between 7 am - 7 pm I am available, please contact me via Amion or Securechat  Between 7 pm - 7 am I am not available, please contact night coverage MD/APP via Amion

## 2019-08-10 ENCOUNTER — Inpatient Hospital Stay (HOSPITAL_COMMUNITY): Payer: Medicare Other

## 2019-08-10 LAB — GLUCOSE, CAPILLARY
Glucose-Capillary: 80 mg/dL (ref 70–99)
Glucose-Capillary: 98 mg/dL (ref 70–99)
Glucose-Capillary: 84 mg/dL (ref 70–99)
Glucose-Capillary: 114 mg/dL — ABNORMAL HIGH (ref 70–99)

## 2019-08-10 MED ORDER — HYDROMORPHONE HCL 1 MG/ML IJ SOLN
0.5000 mg | INTRAMUSCULAR | Status: DC | PRN
  Administered 2019-08-10: 0.5 mg via INTRAVENOUS
  Filled 2019-08-10: qty 0.5

## 2019-08-10 MED ORDER — OXYCODONE HCL 5 MG PO TABS
5.0000 mg | ORAL_TABLET | ORAL | Status: DC | PRN
  Administered 2019-08-10 – 2019-08-12 (×9): 5 mg via ORAL
  Filled 2019-08-10 (×9): qty 1

## 2019-08-10 MED ORDER — ADULT MULTIVITAMIN W/MINERALS CH
1.0000 | ORAL_TABLET | Freq: Every day | ORAL | Status: DC
  Administered 2019-08-10 – 2019-09-05 (×27): 1 via ORAL
  Filled 2019-08-10 (×27): qty 1

## 2019-08-10 NOTE — Progress Notes (Signed)
Pharmacy Antibiotic Note  [name] is a 54 y.o. male admitted on 07/25/2019 with altered mental status. Patient placed on Acyclovir, Vancomycin, Ampicillin, and Ceftriaxone for meningitis. Antibiotics discontinued earlier today. Resumed this at recommendation of Neurology. As of 5/7 plan per ID, to continue vancomycin and rocephin for 6 weeks total - last dose on 6/8 for discitis/osteomyelitis.    Today, 08/10/19  Day 13/42 Vanc/Rocephin  WBC 11.1 on 5/10, not rechecked since  SCr stable 0.63  Vanc Trough 13 on 5/10 - would recheck 5/17 if no reason to check sooner  Plan: Continue vancomycin 1500mg  IV q12 Rocephin 2g IV daily per Md  Height: 5\' 10"  (177.8 cm) Weight: 94.3 kg (207 lb 14.3 oz) IBW/kg (Calculated) : 73  Temp (24hrs), Avg:98.5 F (36.9 C), Min:98.2 F (36.8 C), Max:98.7 F (37.1 C)  Recent Labs  Lab 08/04/19 0424 08/04/19 0424 08/05/19 0330 08/05/19 0330 08/06/19 0323 08/07/19 0322 08/07/19 0929 08/08/19 0353 08/08/19 0956 08/09/19 0324  WBC 11.8*  --  10.8*  --  12.6* 11.1*  --   --   --   --   CREATININE 0.69   < > 0.63   < > 0.62 0.65  --  0.68 0.71 0.63  VANCOTROUGH  --   --   --   --   --   --  13*  --   --   --    < > = values in this interval not displayed.    Estimated Creatinine Clearance: 121.7 mL/min (by C-G formula based on SCr of 0.63 mg/dL).    No Known Allergies  Antimicrobials this admission: 4/28 Ceftriaxone x 1, 4/30 >> 4/28 Doxycycline >> 4/28 4/29 Ampicillin/sulbactam >> 4/30 4/30 Ampicillin>> 5/3 AM, resumed 5/3 PM >> 5/10 4/30 Acyclovir >> 5/7 4/30 Vancomycin >>    Microbiology results: 4/27 Influenza A/B: neg, Covid: neg 4/27 RPR: nonreactive 4/27 HIV: nonreactive 4/27 BCx: ngF 4/27 UCx: ngF  4/29 MRSA PCR: neg 5/2 CSF: gram stain neg,  ngF 5/2 CSF fungal: ngf 5/2 CSF HSV: ngf 5/2 CSF VDRL: ngf   Thank you for allowing pharmacy to be a part of this patient's care.    Adrian Saran, PharmD,  BCPS 08/10/2019 8:26 AM

## 2019-08-10 NOTE — Progress Notes (Signed)
Pt requesting all 4 side rails to be up.

## 2019-08-10 NOTE — Progress Notes (Signed)
PROGRESS NOTE  [name] [id number] DOB: February 15, 1966 DOA: 07/25/2019 PCP: [name], MD   LOS: 15 days   Brief Narrative / Interim history: 54 year old male with HTN, prior CVA, GERD, depression/anxiety, chronic pain syndrome, EtOH, polysubstance abuse including PCP, heroin, cocaine came into the hospital after being out on the street, he was tachycardic and tachypneic on admission.  He was also encephalopathic and required ICU admission for Precedex.  There was concern about persistent encephalopathy and possibility of bacterial meningitis, ID consulted and recommended 14 days of antibiotics.  He also had a CT of the L-spine which showed osteomyelitis/discitis which was confirmed.  ID recommending 6 total weeks of ceftriaxone and vancomycin, with last dose being on 6/8.  Due to substance abuse history he is not a candidate for home PICC line  Subjective / 24h Interval events: Continues to complain of back pain slightly higher than in the past.  No fever or chills.  Assessment & Plan: Principal Problem Lumbar discitis/osteomyelitis-infectious is consulted, recommended vancomycin and ceftriaxone up until 6/8.  He has been on OxyContin 10 mg p.o. daily as well as oxycodone 5 mg, avoid use of IV narcotics.  He is not a candidate for outpatient PICC line due to lack of social support and drug use history and unless TOC can secure an SNF bed he will need to remain hospitalized -Patient has increasing pain, will increase frequency of oral pain meds, will not add IV narcotics at this point.  He has pain to palpation in the thoracic area, obtain MRI of the T-spine since I do not see that imaged  Active Problems Acute metabolic encephalopathy-resolved  Vitamin B12 deficiency-continue IM B12 weekly  Polysubstance abuse with EtOH, amphetamine, opiates, benzodiazepine, PCP, cocaine, heroine-will need cessation.  Continue nicotine patch  Essential hypertension-continue Cardizem,  losartan  Depression/anxiety /polysubstance abuse and history of schizophrenia-patient on  Invega sustenna every 3 months, psych evaluated patient recommending to administer it today on 5/13.  Per pharmacy.  Scheduled Meds: . acetaminophen  1,000 mg Oral Q8H  . benztropine  1 mg Oral Daily  . buPROPion  300 mg Oral Daily  . Chlorhexidine Gluconate Cloth  6 each Topical Daily  . Chlorhexidine Gluconate Cloth  6 each Topical Daily  . diltiazem  240 mg Oral Daily  . enoxaparin (LOVENOX) injection  40 mg Subcutaneous Daily  . folic acid  1 mg Oral Daily  . gabapentin  300 mg Oral BID  . ibuprofen  800 mg Oral Once  . insulin aspart  0-5 Units Subcutaneous QHS  . insulin aspart  0-9 Units Subcutaneous TID WC  . losartan  50 mg Oral Daily  . nicotine  21 mg Transdermal Daily  . oxyCODONE  10 mg Oral Q12H  . paliperidone  234 mg Intramuscular Q30 days  . pantoprazole  40 mg Oral Daily  . thiamine  250 mg Oral Daily  . vitamin B-12  2,000 mcg Oral Daily   Continuous Infusions: . sodium chloride Stopped (07/28/19 1838)  . cefTRIAXone (ROCEPHIN)  IV 2 g (08/10/19 1031)  . vancomycin 1,500 mg (08/10/19 1152)   PRN Meds:.sodium chloride, albuterol, alum & mag hydroxide-simeth, labetalol, LORazepam, metoprolol tartrate, Muscle Rub, ondansetron **OR** ondansetron (ZOFRAN) IV, oxyCODONE, polyethylene glycol, senna-docusate, sodium chloride flush  DVT prophylaxis: Lovenox Code Status: Full code Family Communication: No family present at bedside, discussed with the fianc on 5/12  Status is: Inpatient  Remains inpatient appropriate because:IV treatments appropriate due to intensity of illness or inability to  take PO   Dispo: The patient is from: Home              Anticipated d/c is to: Home              Anticipated d/c date is: > 3 days              Patient currently is not medically stable to d/c.  Consultants:  ID PCCM  Procedures:  None   Microbiology  None    Antimicrobials: Vancomycin / Ceftriaxone     Objective: Vitals:   08/09/19 1300 08/09/19 1302 08/09/19 2122 08/10/19 0522  BP: (!) 120/92  120/89 127/85  Pulse: (!) 110  (!) 105 99  Resp: 18  20 20   Temp:  98.2 F (36.8 C) 98.7 F (37.1 C) 98.6 F (37 C)  TempSrc:  Oral Oral Oral  SpO2: 99%  97% 97%  Weight:      Height:        Intake/Output Summary (Last 24 hours) at 08/10/2019 1230 Last data filed at 08/10/2019 0981 Gross per 24 hour  Intake 530 ml  Output 450 ml  Net 80 ml   Filed Weights   07/26/19 1858  Weight: 94.3 kg    Examination:  Constitutional: No distress Eyes: no icterus seen ENMT: mmm Neck: normal, supple Respiratory: CTA biL Cardiovascular: RRR, no murmurs, no edema  Musculoskeletal: no clubbing / cyanosis. Tenderness to palpation over mid T spine Skin: no rases appreciated Neurologic: non focal   Data Reviewed: I have independently reviewed following labs and imaging studies   CBC: Recent Labs  Lab 08/04/19 0424 08/05/19 0330 08/06/19 0323 08/07/19 0322  WBC 11.8* 10.8* 12.6* 11.1*  HGB 9.3* 9.8* 9.5* 9.4*  HCT 29.9* 31.9* 31.0* 30.8*  MCV 78.1* 78.8* 78.1* 79.0*  PLT 489* 521* 516* 517*   Basic Metabolic Panel: Recent Labs  Lab 08/04/19 0424 08/04/19 0424 08/05/19 0330 08/05/19 0330 08/06/19 0323 08/07/19 0322 08/08/19 0353 08/08/19 0956 08/09/19 0324  NA 131*   < > 138   < > 131* 132* 132* 135 131*  K 3.2*   < > 4.1   < > 3.2* 3.7 3.4* 3.1* 3.7  CL 97*   < > 105   < > 98 100 99 102 99  CO2 24   < > 25   < > 23 24 23 22 22   GLUCOSE 131*   < > 101*   < > 130* 101* 105* 136* 100*  BUN 7   < > 6   < > 6 7 7 7 7   CREATININE 0.69   < > 0.63   < > 0.62 0.65 0.68 0.71 0.63  CALCIUM 8.6*   < > 8.7*   < > 8.6* 8.4* 8.7* 8.7* 8.5*  MG 1.6*  --  2.1  --  1.6* 2.1  --   --   --   PHOS  --   --  3.4  --  2.9 3.6 4.2  --  3.5   < > = values in this interval not displayed.   Liver Function Tests: Recent Labs  Lab 08/04/19 0424  08/04/19 0424 08/05/19 0330 08/06/19 0323 08/07/19 0322 08/08/19 0353 08/09/19 0324  AST 16  --   --   --   --   --   --   ALT 10  --   --   --   --   --   --   ALKPHOS 147*  --   --   --   --   --   --  BILITOT 0.3  --   --   --   --   --   --   PROT 6.7  --   --   --   --   --   --   ALBUMIN 3.1*   < > 3.3* 3.2* 3.1* 3.3* 3.2*   < > = values in this interval not displayed.   Coagulation Profile: No results for input(s): INR, PROTIME in the last 168 hours. HbA1C: No results for input(s): HGBA1C in the last 72 hours. CBG: Recent Labs  Lab 08/09/19 1137 08/09/19 1619 08/09/19 2120 08/10/19 0737 08/10/19 1140  GLUCAP 85 98 106* 80 98    No results found for this or any previous visit (from the past 240 hour(s)).   Radiology Studies: No results found.  Pamella Pert, MD, PhD [facility]  Between 7 am - 7 pm I am available, please contact me via Amion or Securechat  Between 7 pm - 7 am I am not available, please contact night coverage MD/APP via Amion

## 2019-08-10 NOTE — Progress Notes (Signed)
Initial Nutrition Assessment  RD working remotely.   DOCUMENTATION CODES:   Not applicable  INTERVENTION:  - will order Hormel Shake with breakfast meals, each supplement provides 500 kcal and 22 grams protein. - will order Magic Cup BID with meals, each supplement provides 290 kcal and 9 grams of protein. - will order daily multivitamin with minerals.  - will monitor for need for diet education prior to discharge.    NUTRITION DIAGNOSIS:   Increased nutrient needs related to acute illness as evidenced by estimated needs.  GOAL:   Patient will meet greater than or equal to 90% of their needs  MONITOR:   PO intake, Supplement acceptance, Labs, Weight trends  REASON FOR ASSESSMENT:   LOS(day #15)  ASSESSMENT:   54 year old male with medical history of HTN, prior CVA, GERD, depression/anxiety, chronic pain syndrome, alcohol abuse, and polysubstance abuse including PCP, heroin, and cocaine. He presented to the ED with tachycardia and tachypnea. He was noted to be encephalopathic and required ICU admission for precedex. There was concern for bacterial meningitis and ID recommended 14 days of abx. CT of L-spine showed osteomyelitis/discitis. ID recommended 6 weeks IV abx (to end 6/8) and patient is not a candidate for home PICC due to hx of substance abuse/use.  Patient has been eating mainly 100% of meals over the past week. He has not been weighed since admission on 4/28 when he weighed 208 lb. Weight on 04/11/19 was 217 lb. This indicates 9 lb weight loss (4% body weight) in the past 4 months; not significant for time frame.    Labs reviewed; CBGs: 80 and 98 mg/dl, Na: 131 mmol/l, Ca: 8.5 mg/dl. Medications reviewed; 1 mg folvite/day, sliding scale novolog, 40 mg oral protonix/day, 250 mg thiamine/day, 2000 mcg oral cyanocobalamin/day.    NUTRITION - FOCUSED PHYSICAL EXAM:  unable to complete at this time.   Diet Order:   Diet Order            Diet regular Room service  appropriate? Yes; Fluid consistency: Thin  Diet effective now              EDUCATION NEEDS:   Not appropriate for education at this time  Skin:  Skin Assessment: Reviewed RN Assessment  Last BM:  5/11  Height:   Ht Readings from Last 1 Encounters:  07/26/19 5\' 10"  (1.778 m)    Weight:   Wt Readings from Last 1 Encounters:  07/26/19 94.3 kg    Ideal Body Weight:  75.4 kg  BMI:  Body mass index is 29.83 kg/m.  Estimated Nutritional Needs:   Kcal:  2200-2400 kcal  Protein:  110-125 grams  Fluid:  >/= 2.2 L/day     Jarome Matin, MS, RD, LDN, CNSC Inpatient Clinical Dietitian RD pager # available in AMION  After hours/weekend pager # available in [facility]

## 2019-08-11 ENCOUNTER — Inpatient Hospital Stay (HOSPITAL_COMMUNITY): Payer: Medicare Other

## 2019-08-11 LAB — CBC
HCT: 31.6 % — ABNORMAL LOW (ref 39.0–52.0)
Hemoglobin: 9.5 g/dL — ABNORMAL LOW (ref 13.0–17.0)
MCH: 24.1 pg — ABNORMAL LOW (ref 26.0–34.0)
MCHC: 30.1 g/dL (ref 30.0–36.0)
MCV: 80 fL (ref 80.0–100.0)
Platelets: 533 10*3/uL — ABNORMAL HIGH (ref 150–400)
RBC: 3.95 MIL/uL — ABNORMAL LOW (ref 4.22–5.81)
RDW: 17.9 % — ABNORMAL HIGH (ref 11.5–15.5)
WBC: 8.7 10*3/uL (ref 4.0–10.5)
nRBC: 0 % (ref 0.0–0.2)

## 2019-08-11 LAB — BASIC METABOLIC PANEL
Anion gap: 8 (ref 5–15)
BUN: 8 mg/dL (ref 6–20)
CO2: 24 mmol/L (ref 22–32)
Calcium: 8.9 mg/dL (ref 8.9–10.3)
Chloride: 104 mmol/L (ref 98–111)
Creatinine, Ser: 0.67 mg/dL (ref 0.61–1.24)
GFR calc Af Amer: >60 mL/min (ref >60)
GFR calc non Af Amer: >60 mL/min (ref >60)
Glucose, Bld: 96 mg/dL (ref 70–99)
Potassium: 3.4 mmol/L — ABNORMAL LOW (ref 3.5–5.1)
Sodium: 136 mmol/L (ref 135–145)

## 2019-08-11 LAB — GLUCOSE, CAPILLARY
Glucose-Capillary: 92 mg/dL (ref 70–99)
Glucose-Capillary: 91 mg/dL (ref 70–99)
Glucose-Capillary: 114 mg/dL — ABNORMAL HIGH (ref 70–99)

## 2019-08-11 MED ORDER — GADOBUTROL 1 MMOL/ML IV SOLN
9.0000 mL | Freq: Once | INTRAVENOUS | Status: AC | PRN
  Administered 2019-08-11: 9 mL via INTRAVENOUS

## 2019-08-11 MED ORDER — HYDROMORPHONE HCL 1 MG/ML IJ SOLN
0.5000 mg | INTRAMUSCULAR | Status: DC | PRN
  Administered 2019-08-11 (×2): 0.5 mg via INTRAVENOUS
  Filled 2019-08-11 (×2): qty 0.5

## 2019-08-11 NOTE — Plan of Care (Signed)
  Problem: Education: Goal: Knowledge of General Education information will improve Description: Including pain rating scale, medication(s)/side effects and non-pharmacologic comfort measures Outcome: Progressing   Problem: Health Behavior/Discharge Planning: Goal: Ability to manage health-related needs will improve Outcome: Progressing   Problem: Clinical Measurements: Goal: Ability to maintain clinical measurements within normal limits will improve Outcome: Progressing Goal: Will remain free from infection Outcome: Progressing Goal: Diagnostic test results will improve Outcome: Progressing Goal: Respiratory complications will improve Outcome: Progressing Goal: Cardiovascular complication will be avoided Outcome: Progressing   Problem: Activity: Goal: Risk for activity intolerance will decrease Outcome: Progressing   Problem: Nutrition: Goal: Adequate nutrition will be maintained Outcome: Progressing   Problem: Coping: Goal: Level of anxiety will decrease Outcome: Progressing   Problem: Elimination: Goal: Will not experience complications related to bowel motility Outcome: Progressing   Problem: Pain Managment: Goal: General experience of comfort will improve Outcome: Progressing   Problem: Safety: Goal: Ability to remain free from injury will improve Outcome: Progressing   

## 2019-08-11 NOTE — Care Management Important Message (Signed)
Important Message  Patient Details IM Letter given to Dessa Phi RN Case Manager to present to the patient Name: [name] MRN: [id number] Date of Birth: [birth year]   Medicare Important Message Given:  Yes     Kerin Salen 08/11/2019, 11:12 AM

## 2019-08-11 NOTE — Progress Notes (Signed)
PROGRESS NOTE  [name] [id number]:[id number] DOB: 12/22/1965 DOA: 07/25/2019 PCP: [name], MD   LOS: 16 days   Brief Narrative / Interim history: 54 year old male with HTN, prior CVA, GERD, depression/anxiety, chronic pain syndrome, EtOH, polysubstance abuse including PCP, heroin, cocaine came into the hospital after being out on the street, he was tachycardic and tachypneic on admission.  He was also encephalopathic and required ICU admission for Precedex.  There was concern about persistent encephalopathy and possibility of bacterial meningitis, ID consulted and recommended 14 days of antibiotics.  He also had a CT of the L-spine which showed osteomyelitis/discitis which was confirmed.  ID recommending 6 total weeks of ceftriaxone and vancomycin, with last dose being on 6/8.  Due to substance abuse history he is not a candidate for home PICC line  Subjective / 24h Interval events: Reports same back pain today.  No other significant complaints.  Assessment & Plan: Principal Problem Lumbar discitis/osteomyelitis-infectious is consulted, recommended vancomycin and ceftriaxone up until 6/8.  He has been on OxyContin 10 mg p.o. daily as well as oxycodone 5 mg, avoid use of IV narcotics.  He is not a candidate for outpatient PICC line due to lack of social support and drug use history and unless TOC can secure an SNF bed he will need to remain hospitalized -Patient has increasing pain, will increase frequency of oral pain meds, will not add IV narcotics at this point.  He has pain to palpation in the thoracic area, MRI of the T-spine done  5/13 was incomplete due to pain and inability to tolerate the full length of the study however there was concern for a collection T7-T12 which may indicate distention of the epidural venous plexus, unclear in noncontrasted study.  We will attempt to do the remaining sequences today,  Active Problems Acute metabolic encephalopathy-resolved  Vitamin B12  deficiency-continue IM B12 weekly  Polysubstance abuse with EtOH, amphetamine, opiates, benzodiazepine, PCP, cocaine, heroine-will need cessation.  Continue nicotine patch  Essential hypertension-continue Cardizem, losartan  Depression/anxiety /polysubstance abuse and history of schizophrenia-patient on  Invega sustenna every 3 months, psych evaluated patient recommending to administer it today on 5/13.  Per pharmacy.  Scheduled Meds: . acetaminophen  1,000 mg Oral Q8H  . benztropine  1 mg Oral Daily  . buPROPion  300 mg Oral Daily  . Chlorhexidine Gluconate Cloth  6 each Topical Daily  . Chlorhexidine Gluconate Cloth  6 each Topical Daily  . diltiazem  240 mg Oral Daily  . enoxaparin (LOVENOX) injection  40 mg Subcutaneous Daily  . folic acid  1 mg Oral Daily  . gabapentin  300 mg Oral BID  . ibuprofen  800 mg Oral Once  . insulin aspart  0-5 Units Subcutaneous QHS  . insulin aspart  0-9 Units Subcutaneous TID WC  . losartan  50 mg Oral Daily  . multivitamin with minerals  1 tablet Oral Daily  . nicotine  21 mg Transdermal Daily  . oxyCODONE  10 mg Oral Q12H  . paliperidone  234 mg Intramuscular Q30 days  . pantoprazole  40 mg Oral Daily  . thiamine  250 mg Oral Daily  . vitamin B-12  2,000 mcg Oral Daily   Continuous Infusions: . sodium chloride Stopped (07/28/19 1838)  . cefTRIAXone (ROCEPHIN)  IV 2 g (08/11/19 0919)  . vancomycin 1,500 mg (08/11/19 1016)   PRN Meds:.sodium chloride, albuterol, alum & mag hydroxide-simeth, HYDROmorphone (DILAUDID) injection, HYDROmorphone (DILAUDID) injection, labetalol, LORazepam, metoprolol tartrate, Muscle Rub, ondansetron **OR**  ondansetron (ZOFRAN) IV, oxyCODONE, polyethylene glycol, senna-docusate, sodium chloride flush  DVT prophylaxis: Lovenox Code Status: Full code Family Communication: No family present at bedside, discussed with the fianc on 5/12  Status is: Inpatient  Remains inpatient appropriate because:IV treatments  appropriate due to intensity of illness or inability to take PO   Dispo: The patient is from: Home              Anticipated d/c is to: Home              Anticipated d/c date is: > 3 days              Patient currently is not medically stable to d/c.  Consultants:  ID PCCM  Procedures:  None   Microbiology  None   Antimicrobials: Vancomycin / Ceftriaxone     Objective: Vitals:   08/10/19 1359 08/10/19 1612 08/10/19 2112 08/11/19 0513  BP: 111/86  (!) 129/91 116/84  Pulse: (!) 108  (!) 108 97  Resp: 15  20 18   Temp: 98.3 F (36.8 C)  98.5 F (36.9 C) 98.3 F (36.8 C)  TempSrc: Oral  Oral Oral  SpO2: 100%  100% 99%  Weight:  88.2 kg    Height:        Intake/Output Summary (Last 24 hours) at 08/11/2019 1324 Last data filed at 08/11/2019 6962 Gross per 24 hour  Intake 1311.63 ml  Output 450 ml  Net 861.63 ml   Filed Weights   07/26/19 1858 08/10/19 1612  Weight: 94.3 kg 88.2 kg    Examination:  Constitutional: No distress Eyes: No scleral icterus ENMT: Moist mucous membranes Neck: normal, supple Respiratory: No wheezing heard Cardiovascular: Regular rate, no edema Musculoskeletal: no clubbing / cyanosis. Tenderness to palpation over mid T spine Skin: No new rashes Neurologic: Grossly nonfocal  Data Reviewed: I have independently reviewed following labs and imaging studies   CBC: Recent Labs  Lab 08/05/19 0330 08/06/19 0323 08/07/19 0322 08/11/19 0619  WBC 10.8* 12.6* 11.1* 8.7  HGB 9.8* 9.5* 9.4* 9.5*  HCT 31.9* 31.0* 30.8* 31.6*  MCV 78.8* 78.1* 79.0* 80.0  PLT 521* 516* 517* 533*   Basic Metabolic Panel: Recent Labs  Lab 08/05/19 0330 08/05/19 0330 08/06/19 0323 08/06/19 0323 08/07/19 0322 08/08/19 0353 08/08/19 0956 08/09/19 0324 08/11/19 0851  NA 138   < > 131*   < > 132* 132* 135 131* 136  K 4.1   < > 3.2*   < > 3.7 3.4* 3.1* 3.7 3.4*  CL 105   < > 98   < > 100 99 102 99 104  CO2 25   < > 23   < > 24 23 22 22 24   GLUCOSE 101*    < > 130*   < > 101* 105* 136* 100* 96  BUN 6   < > 6   < > 7 7 7 7 8   CREATININE 0.63   < > 0.62   < > 0.65 0.68 0.71 0.63 0.67  CALCIUM 8.7*   < > 8.6*   < > 8.4* 8.7* 8.7* 8.5* 8.9  MG 2.1  --  1.6*  --  2.1  --   --   --   --   PHOS 3.4  --  2.9  --  3.6 4.2  --  3.5  --    < > = values in this interval not displayed.   Liver Function Tests: Recent Labs  Lab 08/05/19 0330 08/06/19 9528  08/07/19 0322 08/08/19 0353 08/09/19 0324  ALBUMIN 3.3* 3.2* 3.1* 3.3* 3.2*   Coagulation Profile: No results for input(s): INR, PROTIME in the last 168 hours. HbA1C: No results for input(s): HGBA1C in the last 72 hours. CBG: Recent Labs  Lab 08/10/19 1140 08/10/19 1647 08/10/19 2107 08/11/19 0737 08/11/19 1210  GLUCAP 98 84 114* 92 91    No results found for this or any previous visit (from the past 240 hour(s)).   Radiology Studies: MR THORACIC SPINE WO CONTRAST  Result Date: 08/10/2019 CLINICAL DATA:  Worsening back pain EXAM: MRI THORACIC SPINE WITHOUT CONTRAST TECHNIQUE: Multiplanar, multisequence MR imaging of the thoracic spine was performed. No intravenous contrast was administered. COMPARISON:  Lumbar spine MRI 08/04/2019 FINDINGS: The examination was terminated prior to completion due to patient discomfort. Four sequences are provided. There is no sagittal T2-weighted imaging. Alignment: Normal Vertebrae: There is abnormal signal within the L1 vertebral body, incompletely visualized. The thoracic vertebral bodies are normal. Cord:  Normal signal and morphology. Paraspinal and other soft tissues: Negative Disc levels: There is a thin hyperintense T1-weighted signal collection along the dorsal aspect of the thoracic vertebral bodies from T7-T12, favored to represent distension of the epidural venous plexus. There is no spinal canal stenosis. No neural foraminal stenosis. IMPRESSION: 1. Incomplete examination due to patient pain and inability to tolerate the full length of the study. No  intravenous contrast material was administered. 2. Incompletely visualized findings of osteomyelitis at L1. 3. Thin, hyperintense T1-weighted signal collection along the dorsal aspect of the thoracic vertebral bodies from T7-T12 likely indicates distension of the epidural venous plexus. If the patient can return for postcontrast sequences, this might be helpful to differentiate this from a epidural phlegmon or abscess. Electronically Signed   By: Deatra Robinson M.D.   On: 08/10/2019 19:07    Pamella Pert, MD, PhD [facility]  Between 7 am - 7 pm I am available, please contact me via Amion or Securechat  Between 7 pm - 7 am I am not available, please contact night coverage MD/APP via Amion

## 2019-08-11 NOTE — Progress Notes (Signed)
Occupational Therapy Treatment Patient Details Name: [name] MRN: [id number] DOB: [birth year] Today's Date: 08/11/2019    History of present illness 54 yo male admitted with toxic metabolic encephalopathy, bacterial meningitis, unwitness fall 5/2. Hx of recent lumbar sg L3-L5 03/2019, polysubstance abuse, schizophrenia, ETOH abuse, CVA, bipolar d/o, back pain, L ankle fusion 2019, R TKA 2018, asthma.   OT comments  [name] reports pain in back 9/10 but tolerated ADL task well without complaints. Treatment focused on improving activity tolerance and independence and safety with ADL tasks. Patient brushed hair seated in recliner then stood at sink to brush teeth and wash face. No loss of balance or complaints of pain with extended standing. CGA for safety as patient has a tendency to be impulsive. Cont POC   Follow Up Recommendations  No OT follow up;Other (comment)    Equipment Recommendations       Recommendations for Other Services      Precautions / Restrictions Precautions Precautions: Fall Precaution Comments: "bad" L hip Restrictions Weight Bearing Restrictions: No       Mobility Bed Mobility Overal bed mobility: Needs Assistance Bed Mobility: Supine to Sit           General bed mobility comments: increased time with HOB leevated and rail.  No facial grimmacing.  Transfers Overall transfer level: Needs assistance Equipment used: Rolling walker (2 wheeled) Transfers: Sit to/from [employer] Sit to Stand: Supervision Stand pivot transfers: Supervision;Min guard       General transfer comment: 50 % VC's on safety with turns.    Balance                                           ADL either performed or assessed with clinical judgement   ADL         Grooming Details (indicate cue type and reason): Patient sat at side of bed and brushed hair. Needed assistance to tie hair back due to a chronic rotator cuff injury.  Patient stood at sink and brushed teeth and washed face with therapist providing SBA. No loss of balance today.                                     Vision       Perception     Praxis      Cognition Arousal/Alertness: Awake/alert Behavior During Therapy: WFL for tasks assessed/performed Overall Cognitive Status: Within Functional Limits for tasks assessed                                          Exercises     Shoulder Instructions       General Comments      Pertinent Vitals/ Pain       Pain Assessment: 0-10 Pain Score: 9  Faces Pain Scale: Hurts even more Pain Location: back, Pain Descriptors / Indicators: Grimacing Pain Intervention(s): Limited activity within patient's tolerance  Home Living                                          Prior Functioning/Environment  Frequency  Min 2X/week        Progress Toward Goals  OT Goals(current goals can now be found in the care plan section)        Plan Discharge plan remains appropriate    Co-evaluation                 AM-PAC OT "6 Clicks" Daily Activity     Outcome Measure                    End of Session    OT Visit Diagnosis: Unsteadiness on feet (R26.81);Pain   Activity Tolerance Patient tolerated treatment well   Patient Left in chair;with call bell/phone within reach;with chair alarm set;with family/visitor present   Nurse Communication Mobility status        Time: 4259-5638 OT Time Calculation (min): 20 min  Charges: OT Treatments $Self Care/Home Management : 8-22 mins  Waldron Session, OTR/L Acute Care Rehab Services  Office [phone]    Kelli Churn 08/11/2019, 3:49 PM

## 2019-08-11 NOTE — Progress Notes (Signed)
Physical Therapy Treatment Patient Details Name: [name] MRN: [id number] DOB: 12/06/1965 Today's Date: 08/11/2019    History of Present Illness 54 yo male admitted with toxic metabolic encephalopathy, bacterial meningitis, unwitness fall 5/2. Hx of recent lumbar sg L3-L5 03/2019, polysubstance abuse, schizophrenia, ETOH abuse, CVA, bipolar d/o, back pain, L ankle fusion 2019, R TKA 2018, asthma.    PT Comments    Assisted OOB to amb in hallway.  General bed mobility comments: increased time with HOB leevated and rail.  No facial grimmacing. General transfer comment: 50 % VC's on safety with turns.General Gait Details: pt has a Rollator in his room but I used RW dur to c/o hip and back pain.  25% VC's on safety with turns.  Tolerated a functional distance. Positioned in recliner as lunch arrived.  Per chart review, pt will stay here at the hospital until his IV Antibiotics are complete June 8th, 2021.   Follow Up Recommendations  SNF;Supervision/Assistance - 24 hour     Equipment Recommendations  None recommended by PT    Recommendations for Other Services       Precautions / Restrictions Precautions Precautions: Fall Precaution Comments: "bad" L hip    Mobility  Bed Mobility Overal bed mobility: Needs Assistance Bed Mobility: Supine to Sit           General bed mobility comments: increased time with HOB leevated and rail.  No facial grimmacing.  Transfers Overall transfer level: Needs assistance Equipment used: Rolling walker (2 wheeled) Transfers: Sit to/from [employer] Sit to Stand: Supervision Stand pivot transfers: Supervision;Min guard       General transfer comment: 50 % VC's on safety with turns.  Ambulation/Gait Ambulation/Gait assistance: Min guard;Min assist Gait Distance (Feet): 225 Feet Assistive device: Rolling walker (2 wheeled) Gait Pattern/deviations: Step-through pattern;Decreased stride length Gait velocity: decreased    General Gait Details: pt has a Rollator in his room but I used RW dur to c/o hip and back pain.  25% VC's on safety with turns.  Tolerated a functional distance.   Stairs             Wheelchair Mobility    Modified Rankin (Stroke Patients Only)       Balance                                            Cognition Arousal/Alertness: Awake/alert Behavior During Therapy: WFL for tasks assessed/performed Overall Cognitive Status: Within Functional Limits for tasks assessed                                        Exercises      General Comments        Pertinent Vitals/Pain Pain Assessment: Faces Faces Pain Scale: Hurts even more Pain Location: L hip and back with activity Pain Descriptors / Indicators: Aching;Sore;Discomfort Pain Intervention(s): Monitored during session    Home Living                      Prior Function            PT Goals (current goals can now be found in the care plan section) Progress towards PT goals: Progressing toward goals    Frequency    Min 2X/week  PT Plan Current plan remains appropriate    Co-evaluation              AM-PAC PT "6 Clicks" Mobility   Outcome Measure  Help needed turning from your back to your side while in a flat bed without using bedrails?: A Little Help needed moving from lying on your back to sitting on the side of a flat bed without using bedrails?: A Little Help needed moving to and from a bed to a chair (including a wheelchair)?: A Little Help needed standing up from a chair using your arms (e.g., wheelchair or bedside chair)?: A Little Help needed to walk in hospital room?: A Little Help needed climbing 3-5 steps with a railing? : A Lot 6 Click Score: 17    End of Session Equipment Utilized During Treatment: Gait belt Activity Tolerance: Patient tolerated treatment well Patient left: in chair;with call bell/phone within reach;with chair alarm  set Nurse Communication: Mobility status PT Visit Diagnosis: History of falling (Z91.81);Difficulty in walking, not elsewhere classified (R26.2);Pain;Muscle weakness (generalized) (M62.81)     Time: 9528-4132 PT Time Calculation (min) (ACUTE ONLY): 25 min  Charges:  $Gait Training: 8-22 mins $Therapeutic Activity: 8-22 mins                     Felecia Shelling  PTA Acute  Rehabilitation Services Pager      [phone] Office      [phone]

## 2019-08-12 ENCOUNTER — Inpatient Hospital Stay (HOSPITAL_COMMUNITY): Payer: Medicare Other

## 2019-08-12 LAB — GLUCOSE, CAPILLARY
Glucose-Capillary: 106 mg/dL — ABNORMAL HIGH (ref 70–99)
Glucose-Capillary: 110 mg/dL — ABNORMAL HIGH (ref 70–99)
Glucose-Capillary: 111 mg/dL — ABNORMAL HIGH (ref 70–99)
Glucose-Capillary: 127 mg/dL — ABNORMAL HIGH (ref 70–99)
Glucose-Capillary: 144 mg/dL — ABNORMAL HIGH (ref 70–99)
Glucose-Capillary: 150 mg/dL — ABNORMAL HIGH (ref 70–99)

## 2019-08-12 MED ORDER — MILK AND MOLASSES ENEMA
1.0000 | Freq: Once | RECTAL | Status: AC
  Administered 2019-08-12: 240 mL via RECTAL
  Filled 2019-08-12: qty 240

## 2019-08-12 MED ORDER — SORBITOL 70 % SOLN
960.0000 mL | TOPICAL_OIL | Freq: Once | ORAL | Status: AC
  Administered 2019-08-12: 960 mL via RECTAL
  Filled 2019-08-12: qty 473

## 2019-08-12 MED ORDER — SENNOSIDES-DOCUSATE SODIUM 8.6-50 MG PO TABS
2.0000 | ORAL_TABLET | Freq: Two times a day (BID) | ORAL | Status: DC
  Administered 2019-08-12 – 2019-08-16 (×9): 2 via ORAL
  Filled 2019-08-12 (×9): qty 2

## 2019-08-12 MED ORDER — OXYCODONE HCL 5 MG PO TABS
5.0000 mg | ORAL_TABLET | ORAL | Status: DC | PRN
  Administered 2019-08-12 – 2019-08-18 (×27): 10 mg via ORAL
  Administered 2019-08-18: 5 mg via ORAL
  Administered 2019-08-19 – 2019-08-20 (×6): 10 mg via ORAL
  Administered 2019-08-20: 5 mg via ORAL
  Administered 2019-08-20 – 2019-08-23 (×14): 10 mg via ORAL
  Administered 2019-08-23: 5 mg via ORAL
  Administered 2019-08-24 (×3): 10 mg via ORAL
  Administered 2019-08-24: 5 mg via ORAL
  Administered 2019-08-24 – 2019-08-25 (×2): 10 mg via ORAL
  Administered 2019-08-25: 5 mg via ORAL
  Administered 2019-08-25 – 2019-08-26 (×4): 10 mg via ORAL
  Filled 2019-08-12 (×2): qty 2
  Filled 2019-08-12: qty 1
  Filled 2019-08-12 (×33): qty 2
  Filled 2019-08-12 (×2): qty 1
  Filled 2019-08-12 (×5): qty 2
  Filled 2019-08-12: qty 1
  Filled 2019-08-12 (×18): qty 2

## 2019-08-12 NOTE — Progress Notes (Signed)
SMOG enema performed at 1600 this shift. Pt returned a moderate amount of brown liquid stool with a few small solid pieces upon completion. Pt tolerated the procedure well. MD notified

## 2019-08-12 NOTE — Progress Notes (Signed)
Second set of post fall VS pt MEWS yellew d/t HR again.  Initiated yellow MEWS protocol, notified NA and Charge RN

## 2019-08-12 NOTE — Progress Notes (Addendum)
@ approximately 0052 NA [place] assisted the patient with rolling walker to bathroom.  She stepped outside the bathroom, closed the bathroom door to allow for privacy as that pt stated he may need to have a BM.  Pt attempted to get off toilet on his own without calling for assistance and his feet slipped from under him, hitting his lt shoulder on the door jam and buttock/lt hip area on the floor.  RN hear the noise came in to pt room to see Pt on the bathroom floor and NA in bathroom doorway.  RN immediately noticed the patient was not wearing is yellow grip socks.  RN instructed NA to get a full set of vitals and a CBG and RN assessed pt.  Pt stated he did not hit is head and wasn't feeling any pain except in his back and some in his rt shoulder area.  RN asked pt what happened he said he tried to get up off toilet and his feet slipped out from under him, that he grabbed the IV poll to help slow his fall.  Pt said he thought he had to have a BM but again only passed gas.   Upon filling out the post fall assessment in the chart  RN became aware that the Pt had an unwitnessed fall in the ICU on 5/2 where pt was found on his knees at the bedside. This RN had this patient when he was on [place] on 08-31-2022 and this was never passed on in shift report nor charted that the pt had a fall this admission.  VS triggered MEWS yellow for HR, pt has been in and out of ST all night Pt second VS still yellow d/t HR initiated Yellow MEWS protocol @ [phone]  *Of note pt has been seen in [facility] for fall 06/2019 and admitted as a result of a fall 5/2 (found down on sidewalk)   08/12/19 0102  What Happened  Was fall witnessed? No  Patients activity before fall bathroom-assisted (PT attempted to get off toilet unassisted)  Point of contact arm/shoulder;buttocks  Was patient injured? No  Patient found in bathroom  Found by Staff-comment (Brittney Owens Shark NA)  Stated prior activity other (comment) (attempted to get off toilet  unassised without socks)  Follow Up  MD notified Jeannene Patella, NP  Time MD notified [place] notified Yes - comment  Time family notified 0115 (pt called)  Additional tests No  Progress note created (see row info) Yes  Adult Fall Risk Assessment  Risk Factor Category (scoring not indicated) Fall has occurred during this admission (document High fall risk)  Age 54  Fall History: Fall within 6 months prior to admission 0  Elimination; Bowel and/or Urine Incontinence 0  Elimination; Bowel and/or Urine Urgency/Frequency 0  Medications: includes PCA/Opiates, Anti-convulsants, Anti-hypertensives, Diuretics, Hypnotics, Laxatives, Sedatives, and Psychotropics 5  Patient Care Equipment 3  Mobility-Assistance 2  Mobility-Gait 2  Mobility-Sensory Deficit 0  Altered awareness of immediate physical environment 0  Impulsiveness 0  Lack of understanding of one's physical/cognitive limitations 4  Total Score 16  Patient Fall Risk Level High fall risk  Adult Fall Risk Interventions  Required Bundle Interventions *See Row Information* High fall risk - low, moderate, and high requirements implemented  Additional Interventions Use of appropriate toileting equipment (bedpan, BSC, etc.)  Screening for Fall Injury Risk (To be completed on HIGH fall risk patients) - Assessing Need for Low Bed  Risk For Fall Injury- Low Bed Criteria  None identified - Continue screening  Will Implement Low Bed and Floor Mats No - Criteria no longer met for low bed  Specialty Low Bed Contraindicated Requires therapeutic low air loss mattress  Screening for Fall Injury Risk (To be completed on HIGH fall risk patients who do not meet crieteria for Low Bed) - Assessing Need for Floor Mats Only  Risk For Fall Injury- Criteria for Floor Mats None identified - No additional interventions needed  Pain Assessment  Pain Scale 0-10  Pain Score 8  Faces Pain Scale 8  Pain Type Acute pain;Chronic pain  Pain Location Back  Pain  Orientation Lower  Multiple Pain Sites Yes  2nd Pain Site  Pain Score 6  Wong-Baker Pain Rating 6  Pain Type Acute pain  Pain Location Shoulder  Pain Orientation Left  Pain Onset Other (Comment) (post fall)  Neurological  Neuro (WDL) WDL  Level of Consciousness Alert  Orientation Level Oriented to person;Oriented to place;Disoriented to time;Disoriented to situation  Cognition Poor attention/concentration;Poor judgement;Poor safety awareness  Speech Clear  Pupil Assessment  No  R Hand Grip Present;Strong  L Hand Grip Present;Strong  RUE Motor Response Purposeful movement  RUE Motor Strength 5  LUE Motor Response Purposeful movement  LUE Motor Strength 5  RLE Motor Response Purposeful movement  LLE Motor Response Purposeful movement  Neuro Symptoms None  Glasgow Coma Scale  Eye Opening 4  Best Verbal Response (NON-intubated) 5  Best Motor Response 6  Glasgow Coma Scale Score 15  Musculoskeletal  Musculoskeletal (WDL) X  Assistive Device BSC;Four wheel walker;Front wheel walker  Generalized Weakness Yes  Weight Bearing Restrictions No  Musculoskeletal Details  Lower Back Limited movement;Weakness  Integumentary  Integumentary (WDL) X  Skin Color Appropriate for ethnicity  Skin Condition Dry  Skin Integrity Ecchymosis;Excoriated (scratch marks)  Ecchymosis Location Arm  Ecchymosis Location Orientation Bilateral  Excoriated Location Leg  Excoriated Location Orientation Left;Lower  Skin Turgor Non-tenting

## 2019-08-12 NOTE — Progress Notes (Signed)
   08/12/19 0052  Assess: MEWS Score  Temp 98 F (36.7 C)  BP 111/82  Pulse Rate (!) 116  Resp 18  Level of Consciousness Alert  SpO2 99 %  O2 Device Room Air  Assess: MEWS Score  MEWS Temp 0  MEWS Systolic 0  MEWS Pulse 2  MEWS RR 0  MEWS LOC 0  MEWS Score 2  MEWS Score Color Yellow  Assess: if the MEWS score is Yellow or Red  Were vital signs taken at a resting state? No  Focused Assessment Documented focused assessment  Early Detection of Sepsis Score *See Row Information* Low  MEWS guidelines implemented *See Row Information* No, vital signs rechecked  Treat  MEWS Interventions Administered scheduled meds/treatments  Take Vital Signs  Increase Vital Sign Frequency  Yellow: Q 2hr X 2 then Q 4hr X 2, if remains yellow, continue Q 4hrs  Escalate  MEWS: Escalate Yellow: discuss with charge nurse/RN and consider discussing with provider and RRT  Notify: Charge Nurse/RN  Name of Charge Nurse/RN Notified Michelle/Pamela RN  Date Charge Nurse/RN Notified 08/12/19  Time Charge Nurse/RN Notified 0400   MEWS Yellow initiated, pt remains Yellow dt HR.  HR has been 1 teens all night.  Routine night vitals HR 109 at that time.  Olin Hauser and Sharyn Lull present when VS taken post fall when 0102 documentation was started

## 2019-08-12 NOTE — Plan of Care (Signed)
  Problem: Education: Goal: Knowledge of General Education information will improve Description: Including pain rating scale, medication(s)/side effects and non-pharmacologic comfort measures Outcome: Progressing   Problem: Health Behavior/Discharge Planning: Goal: Ability to manage health-related needs will improve Outcome: Progressing   Problem: Clinical Measurements: Goal: Ability to maintain clinical measurements within normal limits will improve Outcome: Progressing Goal: Will remain free from infection Outcome: Progressing Goal: Diagnostic test results will improve Outcome: Progressing Goal: Respiratory complications will improve Outcome: Progressing Goal: Cardiovascular complication will be avoided Outcome: Progressing   Problem: Activity: Goal: Risk for activity intolerance will decrease Outcome: Progressing   Problem: Nutrition: Goal: Adequate nutrition will be maintained Outcome: Progressing   Problem: Coping: Goal: Level of anxiety will decrease Outcome: Progressing   Problem: Elimination: Goal: Will not experience complications related to bowel motility Outcome: Progressing   Problem: Pain Managment: Goal: General experience of comfort will improve Outcome: Progressing   Problem: Safety: Goal: Ability to remain free from injury will improve Outcome: Progressing   

## 2019-08-12 NOTE — Progress Notes (Signed)
PROGRESS NOTE  [name]:[id number] DOB: 1966/01/25 DOA: 07/25/2019 PCP: [name], MD   LOS: 17 days   Brief Narrative / Interim history: 54 year old male with HTN, prior CVA, GERD, depression/anxiety, chronic pain syndrome, EtOH, polysubstance abuse including PCP, heroin, cocaine came into the hospital after being out on the street, he was tachycardic and tachypneic on admission.  He was also encephalopathic and required ICU admission for Precedex.  There was concern about persistent encephalopathy and possibility of bacterial meningitis, ID consulted and recommended 14 days of antibiotics.  He also had a CT of the L-spine which showed osteomyelitis/discitis which was confirmed.  ID recommending 6 total weeks of ceftriaxone and vancomycin, with last dose being on 6/8.  Due to substance abuse history he is not a candidate for home PICC line  Subjective / 24h Interval events: Ongoing back pain. Nausea/vomiting overnight.   Assessment & Plan: Principal Problem Lumbar discitis/osteomyelitis-infectious is consulted, recommended vancomycin and ceftriaxone up until 6/8.  He has been on OxyContin 10 mg p.o. daily as well as oxycodone 5 mg, avoid use of IV narcotics.  He is not a candidate for outpatient PICC line due to lack of social support and drug use history and unless TOC can secure an SNF bed he will need to remain hospitalized -Patient had overall increased pain, underwent eventually an MRI of the T-spine over the course of 2 days due to severe pain and that showed T8-L1 epidural enhancement concerning for phlegmon related to the L1-2 discitis.  We will reach out to Dr. Otelia Sergeant and let him know about the findings as he did his surgery last year  Active Problems  Nausea/vomiting -abdominal x-ray overnight with concern for colonic ileus, likely in the setting of narcotic use.  Will increase aggressive bowel regimen with enemas, Senokot  Acute metabolic  encephalopathy-resolved  Vitamin B12 deficiency-continue IM B12 weekly  Polysubstance abuse with EtOH, amphetamine, opiates, benzodiazepine, PCP, cocaine, heroine-will need cessation.  Continue nicotine patch  Essential hypertension-continue Cardizem, losartan  Depression/anxiety /polysubstance abuse and history of schizophrenia-patient on  Invega sustenna every 3 months, psych evaluated patient recommending to administer it on 5/13.  Per pharmacy.  Scheduled Meds: . acetaminophen  1,000 mg Oral Q8H  . benztropine  1 mg Oral Daily  . buPROPion  300 mg Oral Daily  . Chlorhexidine Gluconate Cloth  6 each Topical Daily  . Chlorhexidine Gluconate Cloth  6 each Topical Daily  . diltiazem  240 mg Oral Daily  . enoxaparin (LOVENOX) injection  40 mg Subcutaneous Daily  . folic acid  1 mg Oral Daily  . gabapentin  300 mg Oral BID  . ibuprofen  800 mg Oral Once  . insulin aspart  0-5 Units Subcutaneous QHS  . insulin aspart  0-9 Units Subcutaneous TID WC  . losartan  50 mg Oral Daily  . multivitamin with minerals  1 tablet Oral Daily  . nicotine  21 mg Transdermal Daily  . oxyCODONE  10 mg Oral Q12H  . paliperidone  234 mg Intramuscular Q30 days  . pantoprazole  40 mg Oral Daily  . senna-docusate  2 tablet Oral BID  . sorbitol, milk of mag, mineral oil, glycerin (SMOG) enema  960 mL Rectal Once  . thiamine  250 mg Oral Daily  . vitamin B-12  2,000 mcg Oral Daily   Continuous Infusions: . sodium chloride Stopped (07/28/19 1838)  . cefTRIAXone (ROCEPHIN)  IV 2 g (08/12/19 0827)  . vancomycin 1,500 mg (08/12/19 1121)  PRN Meds:.sodium chloride, albuterol, alum & mag hydroxide-simeth, labetalol, LORazepam, metoprolol tartrate, Muscle Rub, ondansetron **OR** ondansetron (ZOFRAN) IV, oxyCODONE, polyethylene glycol, sodium chloride flush  DVT prophylaxis: Lovenox Code Status: Full code Family Communication: No family present at bedside, discussed with the fianc on 5/15  Status is:  Inpatient  Remains inpatient appropriate because:IV treatments appropriate due to intensity of illness or inability to take PO   Dispo: The patient is from: Home              Anticipated d/c is to: Home              Anticipated d/c date is: > 3 days              Patient currently is not medically stable to d/c.  Consultants:  ID PCCM Psychiatry  Procedures:  None   Microbiology  None   Antimicrobials: Vancomycin / Ceftriaxone     Objective: Vitals:   08/12/19 0304 08/12/19 0524 08/12/19 0703 08/12/19 1143  BP: 106/72 103/80 93/71 100/66  Pulse: (!) 112 (!) 115 (!) 113 (!) 106  Resp: 18 20 20 16   Temp: 98.1 F (36.7 C) 98.2 F (36.8 C) 99 F (37.2 C) 98.9 F (37.2 C)  TempSrc: Oral Oral Oral Oral  SpO2: 100% 98% 100% 98%  Weight:      Height:        Intake/Output Summary (Last 24 hours) at 08/12/2019 1352 Last data filed at 08/12/2019 0600 Gross per 24 hour  Intake 1380.1 ml  Output --  Net 1380.1 ml   Filed Weights   07/26/19 1858 08/10/19 1612  Weight: 94.3 kg 88.2 kg    Examination:  Constitutional: NAD Eyes: No icterus ENMT: mmm Neck: normal, supple Respiratory: No wheezing Cardiovascular: Regular rate and rhythm, no edema Musculoskeletal: no clubbing / cyanosis.  Skin: No rashes Neurologic: No focal deficits  Data Reviewed: I have independently reviewed following labs and imaging studies   CBC: Recent Labs  Lab 08/06/19 0323 08/07/19 0322 08/11/19 0619  WBC 12.6* 11.1* 8.7  HGB 9.5* 9.4* 9.5*  HCT 31.0* 30.8* 31.6*  MCV 78.1* 79.0* 80.0  PLT 516* 517* 533*   Basic Metabolic Panel: Recent Labs  Lab 08/06/19 0323 08/06/19 0323 08/07/19 0322 08/08/19 0353 08/08/19 0956 08/09/19 0324 08/11/19 0851  NA 131*   < > 132* 132* 135 131* 136  K 3.2*   < > 3.7 3.4* 3.1* 3.7 3.4*  CL 98   < > 100 99 102 99 104  CO2 23   < > 24 23 22 22 24   GLUCOSE 130*   < > 101* 105* 136* 100* 96  BUN 6   < > 7 7 7 7 8   CREATININE 0.62   < > 0.65  0.68 0.71 0.63 0.67  CALCIUM 8.6*   < > 8.4* 8.7* 8.7* 8.5* 8.9  MG 1.6*  --  2.1  --   --   --   --   PHOS 2.9  --  3.6 4.2  --  3.5  --    < > = values in this interval not displayed.   Liver Function Tests: Recent Labs  Lab 08/06/19 0323 08/07/19 0322 08/08/19 0353 08/09/19 0324  ALBUMIN 3.2* 3.1* 3.3* 3.2*   Coagulation Profile: No results for input(s): INR, PROTIME in the last 168 hours. HbA1C: No results for input(s): HGBA1C in the last 72 hours. CBG: Recent Labs  Lab 08/11/19 2053 08/12/19 0055 08/12/19 0306 08/12/19 0803 08/12/19  1141  GLUCAP 114* 144* 127* 110* 106*    No results found for this or any previous visit (from the past 240 hour(s)).   Radiology Studies: DG Abd 1 View  Result Date: 08/12/2019 CLINICAL DATA:  Nausea and vomiting EXAM: ABDOMEN - 1 VIEW COMPARISON:  Plain film of the abdomen dated 04/10/2016. FINDINGS: Single view of the abdomen is provided. Moderate amount of gas and stool throughout the colon. No dilated small bowel loops appreciated. No evidence of abnormal fluid collection or free intraperitoneal air seen. Fixation hardware within the lower lumbar spine. IMPRESSION: 1. Fairly large amount of gas throughout the colon raising the possibility of colonic ileus. Moderate amount of stool in the RIGHT colon. 2. No dilated small bowel loops seen. Electronically Signed   By: Bary Richard M.D.   On: 08/12/2019 06:38   MR THORACIC SPINE W CONTRAST  Result Date: 08/11/2019 CLINICAL DATA:  Mid back pain. L1-2 discitis-osteomyelitis. EXAM: MRI THORACIC SPINE WITH CONTRAST TECHNIQUE: Multiplanar, multisequence MR imaging of the thoracic spine was performed following the administration of intravenous contrast. COMPARISON:  Incomplete thoracic spine MRI 08/10/2019. Lumbar spine MRI 08/04/2019. FINDINGS: Alignment:  Normal. Vertebrae: Known L1-2 discitis-osteomyelitis is incompletely evaluated on this thoracic study. As seen on yesterday's incomplete  noncontrast thoracic spine MRI, there is abnormal T1 hyperintensity in the ventral epidural space from T8-L1. This is also T2 hyperintense and does not suppress on the STIR sequence from yesterday's MRI, and there is abnormal epidural enhancement at these levels which is greatest ventrally and measures up to 5 mm in thickness resulting in moderate spinal stenosis which is greatest at T10-11. No discrete epidural fluid collection is identified. Cord: Mild lower thoracic spinal cord flattening due to the epidural process without cord edema. Paraspinal and other soft tissues: Partially visualized paraspinal inflammation at L1-2. Patulous esophagus. Disc levels: Small central disc protrusions at T6-7 and T7-8 without significant stenosis. IMPRESSION: Abnormal epidural enhancement from T8-L1 favored to reflect phlegmon related to L1-2 discitis with result in moderate spinal stenosis. No discrete epidural abscess identified. Electronically Signed   By: Sebastian Ache M.D.   On: 08/11/2019 20:23    Brian Pert, MD, PhD [facility]  Between 7 am - 7 pm I am available, please contact me via Amion or Securechat  Between 7 pm - 7 am I am not available, please contact night coverage MD/APP via Amion

## 2019-08-12 NOTE — Progress Notes (Signed)
PT vomited two times, first approx 2352 and again approx 0305, last documented good BM was 5/11.  Pt stated with earlier episode that he felt like he needed to have a BM but only gas, also stated that he had small hard BM 5/14 in the morning around 10am.  Paged MD with concern for SBO, KUB ordered.  Pt now resting in bed

## 2019-08-13 DIAGNOSIS — R112 Nausea with vomiting, unspecified: Secondary | ICD-10-CM

## 2019-08-13 DIAGNOSIS — M546 Pain in thoracic spine: Secondary | ICD-10-CM

## 2019-08-13 LAB — GLUCOSE, CAPILLARY
Glucose-Capillary: 92 mg/dL (ref 70–99)
Glucose-Capillary: 123 mg/dL — ABNORMAL HIGH (ref 70–99)
Glucose-Capillary: 122 mg/dL — ABNORMAL HIGH (ref 70–99)
Glucose-Capillary: 123 mg/dL — ABNORMAL HIGH (ref 70–99)

## 2019-08-13 NOTE — Progress Notes (Signed)
Pharmacy Antibiotic Note  [name] is a 54 y.o. male admitted on 07/25/2019 with altered mental status. Patient placed on Acyclovir, Vancomycin, Ampicillin, and Ceftriaxone for meningitis. Antibiotics discontinued earlier today. Resumed this at recommendation of Neurology. As of 5/7 plan per ID, to continue vancomycin and rocephin for 6 weeks total - last dose on 6/8 for discitis/osteomyelitis.    Today, 08/13/19  Day 16/42 Vanc/Rocephin  WBC and SCr stable per 5/14 labs, will recheck on 5/17  Vanc Trough 13 on 5/10 - would recheck in next few days   Plan: Continue vancomycin 1500mg  IV q12 Rocephin 2g IV daily per Md  Height: 5\' 10"  (177.8 cm) Weight: 88.2 kg (194 lb 6.4 oz) IBW/kg (Calculated) : 73  Temp (24hrs), Avg:98.2 F (36.8 C), Min:98.2 F (36.8 C), Max:98.2 F (36.8 C)  Recent Labs  Lab 08/07/19 0322 08/07/19 0929 08/08/19 0353 08/08/19 0956 08/09/19 0324 08/11/19 0619 08/11/19 0851  WBC 11.1*  --   --   --   --  8.7  --   CREATININE 0.65  --  0.68 0.71 0.63  --  0.67  VANCOTROUGH  --  13*  --   --   --   --   --     Estimated Creatinine Clearance: 118.1 mL/min (by C-G formula based on SCr of 0.67 mg/dL).    No Known Allergies  Antimicrobials this admission: 4/28 Ceftriaxone x 1, 4/30 >> 4/28 Doxycycline >> 4/28 4/29 Ampicillin/sulbactam >> 4/30 4/30 Ampicillin>> 5/3 AM, resumed 5/3 PM >> 5/10 4/30 Acyclovir >> 5/7 4/30 Vancomycin >>    Microbiology results: 4/27 Influenza A/B: neg, Covid: neg 4/27 RPR: nonreactive 4/27 HIV: nonreactive 4/27 BCx: ngF 4/27 UCx: ngF  4/29 MRSA PCR: neg 5/2 CSF: gram stain neg,  ngF 5/2 CSF fungal: ngf 5/2 CSF HSV: ngf 5/2 CSF VDRL: ngf   Thank you for allowing pharmacy to be a part of this patient's care.    Adrian Saran, PharmD, BCPS 08/13/2019 12:06 PM

## 2019-08-13 NOTE — Progress Notes (Signed)
PROGRESS NOTE  [name] ZOX:[id number] DOB: Mar 26, 1966 DOA: 07/25/2019 PCP: [name], MD   LOS: 18 days   Brief Narrative / Interim history: 54 year old male with HTN, prior CVA, GERD, depression/anxiety, chronic pain syndrome, EtOH, polysubstance abuse including PCP, heroin, cocaine came into the hospital after being out on the street, he was tachycardic and tachypneic on admission.  He was also encephalopathic and required ICU admission for Precedex.  There was concern about persistent encephalopathy and possibility of bacterial meningitis, ID consulted and recommended 14 days of antibiotics.  He also had a CT of the L-spine which showed osteomyelitis/discitis which was confirmed.  ID recommending 6 total weeks of ceftriaxone and vancomycin, with last dose being on 6/8.  Due to substance abuse history he is not a candidate for home PICC line  Subjective / 24h Interval events: No longer has nausea vomiting and appetite is coming back.  Still has back pain.  Assessment & Plan: Principal Problem Lumbar discitis/osteomyelitis-infectious is consulted, recommended vancomycin and ceftriaxone up until 6/8.  He has been on OxyContin 10 mg p.o. daily as well as oxycodone 5 mg, avoid use of IV narcotics.  He is not a candidate for outpatient PICC line due to lack of social support and drug use history and unless TOC can secure an SNF bed he will need to remain hospitalized -Patient had overall increased pain, underwent eventually an MRI of the T-spine over the course of 2 days due to severe pain and that showed T8-L1 epidural enhancement concerning for phlegmon related to the L1-2 discitis.  Contact Dr. Otelia Sergeant tomorrow  Active Problems  Nausea/vomiting -resolved, abdominal x-ray showed concern for colonic ileus, likely in the setting of narcotic use.  Resolved with aggressive bowel regimen  Acute metabolic encephalopathy-resolved  Vitamin B12 deficiency-continue IM B12  weekly  Polysubstance abuse with EtOH, amphetamine, opiates, benzodiazepine, PCP, cocaine, heroine-will need cessation.  Continue nicotine patch  Essential hypertension-continue Cardizem, losartan  Depression/anxiety /polysubstance abuse and history of schizophrenia-patient on  Invega sustenna every 3 months, psych evaluated patient recommending to administer it on 5/13.  Per pharmacy.  Scheduled Meds: . acetaminophen  1,000 mg Oral Q8H  . benztropine  1 mg Oral Daily  . buPROPion  300 mg Oral Daily  . Chlorhexidine Gluconate Cloth  6 each Topical Daily  . Chlorhexidine Gluconate Cloth  6 each Topical Daily  . diltiazem  240 mg Oral Daily  . enoxaparin (LOVENOX) injection  40 mg Subcutaneous Daily  . folic acid  1 mg Oral Daily  . gabapentin  300 mg Oral BID  . ibuprofen  800 mg Oral Once  . insulin aspart  0-5 Units Subcutaneous QHS  . insulin aspart  0-9 Units Subcutaneous TID WC  . losartan  50 mg Oral Daily  . multivitamin with minerals  1 tablet Oral Daily  . nicotine  21 mg Transdermal Daily  . oxyCODONE  10 mg Oral Q12H  . paliperidone  234 mg Intramuscular Q30 days  . pantoprazole  40 mg Oral Daily  . senna-docusate  2 tablet Oral BID  . thiamine  250 mg Oral Daily  . vitamin B-12  2,000 mcg Oral Daily   Continuous Infusions: . sodium chloride Stopped (07/28/19 1838)  . cefTRIAXone (ROCEPHIN)  IV 2 g (08/12/19 0827)  . vancomycin 1,500 mg (08/12/19 2237)   PRN Meds:.sodium chloride, albuterol, alum & mag hydroxide-simeth, labetalol, LORazepam, metoprolol tartrate, Muscle Rub, ondansetron **OR** ondansetron (ZOFRAN) IV, oxyCODONE, polyethylene glycol, sodium chloride flush  DVT  prophylaxis: Lovenox Code Status: Full code Family Communication: No family present at bedside, discussed with the fianc on 5/15  Status is: Inpatient  Remains inpatient appropriate because:IV treatments appropriate due to intensity of illness or inability to take PO   Dispo: The patient  is from: Home              Anticipated d/c is to: Home              Anticipated d/c date is: > 3 days              Patient currently is not medically stable to d/c.  Consultants:  ID PCCM Psychiatry  Procedures:  None   Microbiology  None   Antimicrobials: Vancomycin / Ceftriaxone     Objective: Vitals:   08/12/19 0703 08/12/19 1143 08/12/19 2030 08/13/19 0447  BP: 93/71 100/66 109/78 (!) 86/68  Pulse: (!) 113 (!) 106 (!) 106 100  Resp: 20 16 17 18   Temp: 99 F (37.2 C) 98.9 F (37.2 C) 98.2 F (36.8 C) 98.2 F (36.8 C)  TempSrc: Oral Oral    SpO2: 100% 98% 99% 99%  Weight:      Height:        Intake/Output Summary (Last 24 hours) at 08/13/2019 7846 Last data filed at 08/13/2019 0300 Gross per 24 hour  Intake 820.1 ml  Output --  Net 820.1 ml   Filed Weights   07/26/19 1858 08/10/19 1612  Weight: 94.3 kg 88.2 kg    Examination:  Constitutional: No distress Eyes: No icterus ENMT: mmm Neck: normal, supple Respiratory: Clear, no wheezing Cardiovascular: Regular rate and rhythm, no edema Musculoskeletal: no clubbing / cyanosis.  Skin: No rashes Neurologic: No focal deficits  Data Reviewed: I have independently reviewed following labs and imaging studies   CBC: Recent Labs  Lab 08/07/19 0322 08/11/19 0619  WBC 11.1* 8.7  HGB 9.4* 9.5*  HCT 30.8* 31.6*  MCV 79.0* 80.0  PLT 517* 533*   Basic Metabolic Panel: Recent Labs  Lab 08/07/19 0322 08/08/19 0353 08/08/19 0956 08/09/19 0324 08/11/19 0851  NA 132* 132* 135 131* 136  K 3.7 3.4* 3.1* 3.7 3.4*  CL 100 99 102 99 104  CO2 24 23 22 22 24   GLUCOSE 101* 105* 136* 100* 96  BUN 7 7 7 7 8   CREATININE 0.65 0.68 0.71 0.63 0.67  CALCIUM 8.4* 8.7* 8.7* 8.5* 8.9  MG 2.1  --   --   --   --   PHOS 3.6 4.2  --  3.5  --    Liver Function Tests: Recent Labs  Lab 08/07/19 0322 08/08/19 0353 08/09/19 0324  ALBUMIN 3.1* 3.3* 3.2*   Coagulation Profile: No results for input(s): INR, PROTIME in  the last 168 hours. HbA1C: No results for input(s): HGBA1C in the last 72 hours. CBG: Recent Labs  Lab 08/12/19 0803 08/12/19 1141 08/12/19 1616 08/12/19 2143 08/13/19 0805  GLUCAP 110* 106* 111* 150* 92    No results found for this or any previous visit (from the past 240 hour(s)).   Radiology Studies: No results found.  Pamella Pert, MD, PhD [facility]  Between 7 am - 7 pm I am available, please contact me via Amion or Securechat  Between 7 pm - 7 am I am not available, please contact night coverage MD/APP via Amion

## 2019-08-14 LAB — BASIC METABOLIC PANEL
Anion gap: 9 (ref 5–15)
BUN: 13 mg/dL (ref 6–20)
CO2: 23 mmol/L (ref 22–32)
Calcium: 8.6 mg/dL — ABNORMAL LOW (ref 8.9–10.3)
Chloride: 103 mmol/L (ref 98–111)
Creatinine, Ser: 0.73 mg/dL (ref 0.61–1.24)
GFR calc Af Amer: >60 mL/min (ref >60)
GFR calc non Af Amer: >60 mL/min (ref >60)
Glucose, Bld: 96 mg/dL (ref 70–99)
Potassium: 3.2 mmol/L — ABNORMAL LOW (ref 3.5–5.1)
Sodium: 135 mmol/L (ref 135–145)

## 2019-08-14 LAB — GLUCOSE, CAPILLARY
Glucose-Capillary: 112 mg/dL — ABNORMAL HIGH (ref 70–99)
Glucose-Capillary: 110 mg/dL — ABNORMAL HIGH (ref 70–99)
Glucose-Capillary: 92 mg/dL (ref 70–99)
Glucose-Capillary: 115 mg/dL — ABNORMAL HIGH (ref 70–99)

## 2019-08-14 LAB — CBC
HCT: 29.6 % — ABNORMAL LOW (ref 39.0–52.0)
Hemoglobin: 9.1 g/dL — ABNORMAL LOW (ref 13.0–17.0)
MCH: 24.6 pg — ABNORMAL LOW (ref 26.0–34.0)
MCHC: 30.7 g/dL (ref 30.0–36.0)
MCV: 80 fL (ref 80.0–100.0)
Platelets: 423 10*3/uL — ABNORMAL HIGH (ref 150–400)
RBC: 3.7 MIL/uL — ABNORMAL LOW (ref 4.22–5.81)
RDW: 18.4 % — ABNORMAL HIGH (ref 11.5–15.5)
WBC: 9.5 10*3/uL (ref 4.0–10.5)
nRBC: 0 % (ref 0.0–0.2)

## 2019-08-14 MED ORDER — POTASSIUM CHLORIDE CRYS ER 20 MEQ PO TBCR
40.0000 meq | EXTENDED_RELEASE_TABLET | Freq: Once | ORAL | Status: AC
  Administered 2019-08-14: 40 meq via ORAL
  Filled 2019-08-14: qty 2

## 2019-08-14 NOTE — Plan of Care (Signed)

## 2019-08-14 NOTE — Progress Notes (Signed)
Orthopedic Tech Progress Note Patient Details:  [name] 1965/10/12 [id number] Called [employer] and placed order for brace. Also faxed over.  Patient ID: [name], male   DOB: [birth year], 54 y.o.   MRN: [id number]   Staci Righter 08/14/2019, 2:09 PM

## 2019-08-14 NOTE — Progress Notes (Signed)
Physical Therapy Treatment Patient Details Name: [name] MRN: [id number] DOB: 1965/10/11 Today's Date: 08/14/2019    History of Present Illness 54 yo male admitted with toxic metabolic encephalopathy, bacterial meningitis, unwitness fall 5/2. Hx of recent lumbar sg L3-L5 03/2019, polysubstance abuse, schizophrenia, ETOH abuse, CVA, bipolar d/o, back pain, L ankle fusion 2019, R TKA 2018, asthma.    PT Comments    Assisted OOB to amb.  General Comments: pt cooperative and willing but feels he needs more pain meds than what he is getting. General bed mobility comments: increased time with HOB leevated and rail.  No facial grimmacing.General transfer comment: 50 % VC's on safety with turns and IV line. General Gait Details: pt has a Rollator in his room but I used RW dur to c/o hip and back pain.  25% VC's on safety with turns.  Decreased distance this session due to increased c/o pain.  Follow Up Recommendations  SNF;Supervision/Assistance - 24 hour     Equipment Recommendations  None recommended by PT    Recommendations for Other Services       Precautions / Restrictions Precautions Precautions: Fall Precaution Comments: "bad" L hip Restrictions Weight Bearing Restrictions: No    Mobility  Bed Mobility Overal bed mobility: Needs Assistance Bed Mobility: Supine to Sit     Supine to sit: Supervision     General bed mobility comments: increased time with HOB leevated and rail.  No facial grimmacing.  Transfers Overall transfer level: Needs assistance Equipment used: Rolling walker (2 wheeled) Transfers: Sit to/from [employer] Sit to Stand: Supervision Stand pivot transfers: Supervision       General transfer comment: 50 % VC's on safety with turns and IV line.  Ambulation/Gait Ambulation/Gait assistance: Min guard;Min assist Gait Distance (Feet): 85 Feet Assistive device: Rolling walker (2 wheeled) Gait Pattern/deviations: Step-through  pattern;Decreased stride length Gait velocity: decreased   General Gait Details: pt has a Rollator in his room but I used RW dur to c/o hip and back pain.  25% VC's on safety with turns.  Decreased distance this session due to increased c/o pain.   Stairs             Wheelchair Mobility    Modified Rankin (Stroke Patients Only)       Balance                                            Cognition   Behavior During Therapy: WFL for tasks assessed/performed Overall Cognitive Status: Within Functional Limits for tasks assessed                                 General Comments: pt cooperative and willing but feels he needs more pain meds than what he is getting.      Exercises      General Comments        Pertinent Vitals/Pain Pain Assessment: Faces Faces Pain Scale: Hurts even more Pain Location: back, L hip Pain Descriptors / Indicators: Grimacing;Discomfort Pain Intervention(s): Monitored during session;Premedicated before session;Repositioned    Home Living                      Prior Function            PT Goals (current goals can  now be found in the care plan section) Progress towards PT goals: Progressing toward goals    Frequency    Min 2X/week      PT Plan Current plan remains appropriate    Co-evaluation              AM-PAC PT "6 Clicks" Mobility   Outcome Measure  Help needed turning from your back to your side while in a flat bed without using bedrails?: None Help needed moving from lying on your back to sitting on the side of a flat bed without using bedrails?: None Help needed moving to and from a bed to a chair (including a wheelchair)?: None Help needed standing up from a chair using your arms (e.g., wheelchair or bedside chair)?: A Little Help needed to walk in hospital room?: A Little Help needed climbing 3-5 steps with a railing? : A Little 6 Click Score: 21    End of Session  Equipment Utilized During Treatment: Gait belt Activity Tolerance: Patient tolerated treatment well Patient left: in chair;with call bell/phone within reach;with chair alarm set Nurse Communication: Mobility status PT Visit Diagnosis: History of falling (Z91.81);Difficulty in walking, not elsewhere classified (R26.2);Pain;Muscle weakness (generalized) (M62.81)     Time: 8413-2440 PT Time Calculation (min) (ACUTE ONLY): 18 min  Charges:  $Gait Training: 8-22 mins                     Felecia Shelling  PTA Acute  Rehabilitation Services Pager      [phone] Office      [phone]

## 2019-08-14 NOTE — Progress Notes (Addendum)
PROGRESS NOTE  [name] [id number] DOB: 06/27/1965 DOA: 07/25/2019 PCP: [name], MD   LOS: 19 days   Brief Narrative / Interim history: 54 year old male with HTN, prior CVA, GERD, depression/anxiety, chronic pain syndrome, EtOH, polysubstance abuse including PCP, heroin, cocaine came into the hospital after being out on the street, he was tachycardic and tachypneic on admission.  He was also encephalopathic and required ICU admission for Precedex.  There was concern about persistent encephalopathy and possibility of bacterial meningitis, ID consulted and recommended 14 days of antibiotics.  He also had a CT of the L-spine which showed osteomyelitis/discitis which was confirmed.  ID recommending 6 total weeks of ceftriaxone and vancomycin, with last dose being on 6/8.  Due to substance abuse history he is not a candidate for home PICC line  Subjective / 24h Interval events: Complains of back pain, appreciates that overall in the last week his back pain has gotten significantly worse.  No nausea or vomiting, no abdominal pain.  Appetite is back  Assessment & Plan: Principal Problem Lumbar discitis/osteomyelitis-infectious is consulted, recommended vancomycin and ceftriaxone up until 6/8.  He has been on OxyContin 10 mg p.o. daily as well as oxycodone overall, avoid use of IV narcotics.  He is not a candidate for outpatient PICC line due to lack of social support and drug use history and unless TOC can secure an SNF bed he will need to remain hospitalized -Patient had overall increased pain, underwent eventually an MRI of the T-spine over the course of 2 days due to severe pain and that showed T8-L1 epidural enhancement concerning for phlegmon related to the L1-2 discitis. -Consulted Dr. Otelia Sergeant, appreciate input.  Discussed with him over the phone.  Active Problems  Nausea/vomiting -resolved, abdominal x-ray showed concern for colonic ileus, likely in the setting of narcotic use.   Improved, able to eat and drink without difficulties  Acute metabolic encephalopathy-resolved  Vitamin B12 deficiency-continue IM B12 weekly  Polysubstance abuse with EtOH, amphetamine, opiates, benzodiazepine, PCP, cocaine, heroine-will need cessation.  Continue nicotine patch  Essential hypertension-continue Cardizem, losartan.  Blood pressure stable  Depression/anxiety /polysubstance abuse and history of schizophrenia-patient on  Invega sustenna every 3 months, psych evaluated patient recommending to administer it on 5/13.  Per pharmacy.  Scheduled Meds: . acetaminophen  1,000 mg Oral Q8H  . benztropine  1 mg Oral Daily  . buPROPion  300 mg Oral Daily  . Chlorhexidine Gluconate Cloth  6 each Topical Daily  . Chlorhexidine Gluconate Cloth  6 each Topical Daily  . diltiazem  240 mg Oral Daily  . enoxaparin (LOVENOX) injection  40 mg Subcutaneous Daily  . folic acid  1 mg Oral Daily  . gabapentin  300 mg Oral BID  . ibuprofen  800 mg Oral Once  . insulin aspart  0-5 Units Subcutaneous QHS  . insulin aspart  0-9 Units Subcutaneous TID WC  . losartan  50 mg Oral Daily  . multivitamin with minerals  1 tablet Oral Daily  . nicotine  21 mg Transdermal Daily  . oxyCODONE  10 mg Oral Q12H  . paliperidone  234 mg Intramuscular Q30 days  . pantoprazole  40 mg Oral Daily  . senna-docusate  2 tablet Oral BID  . thiamine  250 mg Oral Daily  . vitamin B-12  2,000 mcg Oral Daily   Continuous Infusions: . sodium chloride Stopped (07/28/19 1838)  . cefTRIAXone (ROCEPHIN)  IV 2 g (08/14/19 1017)  . vancomycin 1,500 mg (08/14/19 1153)  PRN Meds:.sodium chloride, albuterol, alum & mag hydroxide-simeth, labetalol, LORazepam, metoprolol tartrate, Muscle Rub, ondansetron **OR** ondansetron (ZOFRAN) IV, oxyCODONE, polyethylene glycol, sodium chloride flush  DVT prophylaxis: Lovenox Code Status: Full code Family Communication: No family present at bedside, discussed with the fianc on  5/15  Status is: Inpatient  Remains inpatient appropriate because:IV treatments appropriate due to intensity of illness or inability to take PO   Dispo: The patient is from: Home              Anticipated d/c is to: Home              Anticipated d/c date is: > 3 days              Patient currently is not medically stable to d/c.  Consultants:  ID PCCM Psychiatry  Procedures:  None   Microbiology  None   Antimicrobials: Vancomycin / Ceftriaxone     Objective: Vitals:   08/13/19 2049 08/14/19 0519 08/14/19 1006 08/14/19 1140  BP: 104/79 113/87 (!) 144/82   Pulse: 100 (!) 108 (!) 114   Resp: 17 17  17   Temp: 99 F (37.2 C) 98.4 F (36.9 C)    TempSrc: Oral Oral    SpO2: 100% 99%    Weight:      Height:        Intake/Output Summary (Last 24 hours) at 08/14/2019 1300 Last data filed at 08/13/2019 1800 Gross per 24 hour  Intake 400 ml  Output --  Net 400 ml   Filed Weights   07/26/19 1858 08/10/19 1612  Weight: 94.3 kg 88.2 kg    Examination:  Constitutional: No distress, in bed Eyes: No scleral icterus ENMT: Moist mucous membranes Neck: normal, supple Respiratory: Clear to auscultation bilaterally, no wheezing heard Cardiovascular: Regular rate and rhythm, no edema Musculoskeletal: no clubbing / cyanosis.  Skin: No rashes seen Neurologic: Nonfocal, equal strength  Data Reviewed: I have independently reviewed following labs and imaging studies   CBC: Recent Labs  Lab 08/11/19 0619 08/14/19 0353  WBC 8.7 9.5  HGB 9.5* 9.1*  HCT 31.6* 29.6*  MCV 80.0 80.0  PLT 533* 423*   Basic Metabolic Panel: Recent Labs  Lab 08/08/19 0353 08/08/19 0956 08/09/19 0324 08/11/19 0851 08/14/19 0353  NA 132* 135 131* 136 135  K 3.4* 3.1* 3.7 3.4* 3.2*  CL 99 102 99 104 103  CO2 23 22 22 24 23   GLUCOSE 105* 136* 100* 96 96  BUN 7 7 7 8 13   CREATININE 0.68 0.71 0.63 0.67 0.73  CALCIUM 8.7* 8.7* 8.5* 8.9 8.6*  PHOS 4.2  --  3.5  --   --    Liver Function  Tests: Recent Labs  Lab 08/08/19 0353 08/09/19 0324  ALBUMIN 3.3* 3.2*   Coagulation Profile: No results for input(s): INR, PROTIME in the last 168 hours. HbA1C: No results for input(s): HGBA1C in the last 72 hours. CBG: Recent Labs  Lab 08/13/19 1206 08/13/19 1638 08/13/19 2100 08/14/19 0719 08/14/19 1147  GLUCAP 123* 122* 123* 112* 110*    No results found for this or any previous visit (from the past 240 hour(s)).   Radiology Studies: No results found.  Pamella Pert, MD, PhD [facility]  Between 7 am - 7 pm I am available, please contact me via Amion or Securechat  Between 7 pm - 7 am I am not available, please contact night coverage MD/APP via Amion

## 2019-08-15 LAB — C-REACTIVE PROTEIN: CRP: 1.4 mg/dL — ABNORMAL HIGH (ref <1.0)

## 2019-08-15 LAB — GLUCOSE, CAPILLARY
Glucose-Capillary: 94 mg/dL (ref 70–99)
Glucose-Capillary: 92 mg/dL (ref 70–99)
Glucose-Capillary: 190 mg/dL — ABNORMAL HIGH (ref 70–99)
Glucose-Capillary: 116 mg/dL — ABNORMAL HIGH (ref 70–99)

## 2019-08-15 LAB — SEDIMENTATION RATE: Sed Rate: 55 mm/hr — ABNORMAL HIGH (ref 0–16)

## 2019-08-15 NOTE — Progress Notes (Signed)
Pharmacy Antibiotic Note  [name] is a 54 y.o. male admitted on 07/25/2019 with meningitis.  Pharmacy has been consulted for vancomycin dosing.  Plan: Retime the vancomycin trough to 0930 D/c future doses to prevent doses from being hung prior to VT drawn RPh will reorder after level drawn.   Height: 5\' 10"  (177.8 cm) Weight: 88.2 kg (194 lb 6.4 oz) IBW/kg (Calculated) : 73  Temp (24hrs), Avg:98.4 F (36.9 C), Min:98.1 F (36.7 C), Max:98.6 F (37 C)  Recent Labs  Lab 08/09/19 0324 08/11/19 0619 08/11/19 0851 08/14/19 0353  WBC  --  8.7  --  9.5  CREATININE 0.63  --  0.67 0.73    Estimated Creatinine Clearance: 118.1 mL/min (by C-G formula based on SCr of 0.73 mg/dL).    No Known Allergies  Antimicrobials this admission: 4/28 Ceftriaxone x 1, 4/30 >> 4/28 Doxycycline >> 4/28 4/29 Ampicillin/sulbactam >>4/30 4/30 Ampicillin>> 5/3 AM, resumed 5/3 PM >> 5/10 4/30 Acyclovir >> 5/7 4/30 Vancomycin >>  Microbiology results: 4/27 Influenza A/B: neg, Covid: neg 4/27 RPR: nonreactive 4/27 HIV: nonreactive 4/27BCx: ngF 4/27UCx: ngF 4/29 MRSA PCR: neg 5/2 CSF: gram stain neg,  ngF 5/2 CSF fungal: ngf 5/2 CSF HSV: ngf 5/2 CSF VDRL: ngf Thank you for allowing pharmacy to be a part of this patient's care.  Nani Skillern Crowford 08/15/2019 10:29 PM

## 2019-08-15 NOTE — Progress Notes (Signed)
Occupational Therapy Treatment Patient Details Name: [name] MRN: [id number] DOB: 08-24-1965 Today's Date: 08/15/2019    History of present illness 54 yo male admitted with toxic metabolic encephalopathy, bacterial meningitis, unwitness fall 5/2. Hx of recent lumbar sg L3-L5 03/2019, polysubstance abuse, schizophrenia, ETOH abuse, CVA, bipolar d/o, back pain, L ankle fusion 2019, R TKA 2018, asthma.   OT comments         Follow Up Recommendations  No OT follow up;Other (comment)    Equipment Recommendations  None recommended by OT    Recommendations for Other Services      Precautions / Restrictions Precautions Precautions: Fall Precaution Comments: "bad" L hip Restrictions Weight Bearing Restrictions: No       Mobility Bed Mobility Overal bed mobility: Needs Assistance Bed Mobility: Supine to Sit;Sit to Supine     Supine to sit: Supervision Sit to supine: Supervision   General bed mobility comments: increased time with HOB leevated and rail.  No facial grimmacing.  Transfers Overall transfer level: Needs assistance Equipment used: Rolling walker (2 wheeled) Transfers: Sit to/from [employer] Sit to Stand: Supervision Stand pivot transfers: Supervision       General transfer comment: 50 % VC's on safety with turns and IV line.    Balance Overall balance assessment: Needs assistance;History of Falls Sitting-balance support: No upper extremity supported;Feet supported Sitting balance-[name]: Good     Standing balance support: Bilateral upper extremity supported;During functional activity Standing balance-[name]: Poor                             ADL either performed or assessed with clinical judgement   ADL Overall ADL's : Needs assistance/impaired Eating/Feeding: Independent   Grooming: Brushing hair;Set up;Wash/dry face;Standing;Cueing for safety                   Toilet Transfer: Minimal  assistance;Stand-pivot;Cueing for safety;Cueing for sequencing Toilet Transfer Details (indicate cue type and reason): bed to chair Toileting- Clothing Manipulation and Hygiene: Min guard;Sit to/from stand       Functional mobility during ADLs: Rolling walker;Min guard       Vision Patient Visual Report: No change from baseline     Perception     Praxis      Cognition Arousal/Alertness: Awake/alert Behavior During Therapy: WFL for tasks assessed/performed Overall Cognitive Status: Within Functional Limits for tasks assessed                                                     Pertinent Vitals/ Pain       Pain Score: 5  Pain Location: back, L hip Pain Descriptors / Indicators: Grimacing;Discomfort         Frequency  Min 2X/week        Progress Toward Goals  OT Goals(current goals can now be found in the care plan section)  Progress towards OT goals: Progressing toward goals     Plan Discharge plan remains appropriate    Co-evaluation                 AM-PAC OT "6 Clicks" Daily Activity     Outcome Measure   Help from another person eating meals?: None Help from another person taking care of personal grooming?: A Little Help from another  person toileting, which includes using toliet, bedpan, or urinal?: A Little Help from another person bathing (including washing, rinsing, drying)?: A Little Help from another person to put on and taking off regular upper body clothing?: None Help from another person to put on and taking off regular lower body clothing?: A Little 6 Click Score: 20    End of Session Equipment Utilized During Treatment: Rolling walker  OT Visit Diagnosis: Unsteadiness on feet (R26.81);Pain   Activity Tolerance Patient tolerated treatment well   Patient Left with call bell/phone within reach;with chair alarm set;with family/visitor present;in bed   Nurse Communication Mobility status        Time:  6045-4098 OT Time Calculation (min): 17 min  Charges: OT General Charges $OT Visit: 1 Visit OT Treatments $Self Care/Home Management : 8-22 mins  Lise Auer, OT Acute Rehabilitation Services [phone] Office- [phone]      Bonney Berres, Karin Golden D 08/15/2019, 3:51 PM

## 2019-08-15 NOTE — Progress Notes (Signed)
PROGRESS NOTE  [name] XBM:[id number] DOB: 1965-04-24 DOA: 07/25/2019 PCP: [name], MD   LOS: 20 days   Brief Narrative / Interim history: 54 year old male with HTN, prior CVA, GERD, depression/anxiety, chronic pain syndrome, EtOH, polysubstance abuse including PCP, heroin, cocaine came into the hospital after being out on the street, he was tachycardic and tachypneic on admission.  He was also encephalopathic and required ICU admission for Precedex.  There was concern about persistent encephalopathy and possibility of bacterial meningitis, ID consulted and recommended 14 days of antibiotics.  He also had a CT of the L-spine which showed osteomyelitis/discitis which was confirmed.  ID recommending 6 total weeks of ceftriaxone and vancomycin, with last dose being on 6/8.  Due to substance abuse history he is not a candidate for home PICC line  Subjective / 24h Interval events: Somewhat sleepy this morning, no significant complaints.  Back pain seems controlled.  States that his brace is helping  Assessment & Plan: Principal Problem Lumbar discitis/osteomyelitis-infectious is consulted, recommended vancomycin and ceftriaxone up until 6/8.  He has been on OxyContin 10 mg p.o. daily as well as oxycodone overall, avoid use of IV narcotics.  He is not a candidate for outpatient PICC line due to lack of social support and drug use history and unless TOC can secure an SNF bed he will need to remain hospitalized -Patient had overall increased pain, underwent eventually an MRI of the T-spine over the course of 2 days due to severe pain and that showed T8-L1 epidural enhancement concerning for phlegmon related to the L1-2 discitis. -Consulted Dr. Otelia Sergeant, discussed with him over the phone yesterday, appreciate input.  For now recommending a TLCO brace  Active Problems  Nausea/vomiting -resolved, abdominal x-ray showed concern for colonic ileus, likely in the setting of narcotic use.  Improved,  able to eat and drink without difficulties  Acute metabolic encephalopathy-resolved  Vitamin B12 deficiency-continue IM B12 weekly  Polysubstance abuse with EtOH, amphetamine, opiates, benzodiazepine, PCP, cocaine, heroine-per fianc has been clean for a number of years  Essential hypertension-continue Cardizem, losartan.  Blood pressure stable  Depression/anxiety /polysubstance abuse and history of schizophrenia-patient on  Invega sustenna every 3 months, psych evaluated patient recommending to administer it on 5/13.  Per pharmacy.  Scheduled Meds: . acetaminophen  1,000 mg Oral Q8H  . benztropine  1 mg Oral Daily  . buPROPion  300 mg Oral Daily  . Chlorhexidine Gluconate Cloth  6 each Topical Daily  . Chlorhexidine Gluconate Cloth  6 each Topical Daily  . diltiazem  240 mg Oral Daily  . enoxaparin (LOVENOX) injection  40 mg Subcutaneous Daily  . folic acid  1 mg Oral Daily  . gabapentin  300 mg Oral BID  . ibuprofen  800 mg Oral Once  . insulin aspart  0-5 Units Subcutaneous QHS  . insulin aspart  0-9 Units Subcutaneous TID WC  . losartan  50 mg Oral Daily  . multivitamin with minerals  1 tablet Oral Daily  . nicotine  21 mg Transdermal Daily  . oxyCODONE  10 mg Oral Q12H  . paliperidone  234 mg Intramuscular Q30 days  . pantoprazole  40 mg Oral Daily  . senna-docusate  2 tablet Oral BID  . thiamine  250 mg Oral Daily  . vitamin B-12  2,000 mcg Oral Daily   Continuous Infusions: . sodium chloride Stopped (07/28/19 1838)  . cefTRIAXone (ROCEPHIN)  IV 2 g (08/15/19 1103)  . vancomycin 1,500 mg (08/15/19 0819)  PRN Meds:.sodium chloride, albuterol, alum & mag hydroxide-simeth, labetalol, LORazepam, metoprolol tartrate, Muscle Rub, ondansetron **OR** ondansetron (ZOFRAN) IV, oxyCODONE, polyethylene glycol, sodium chloride flush  DVT prophylaxis: Lovenox Code Status: Full code Family Communication: No family present at bedside, discussed with the fianc on 5/15  Status is:  Inpatient  Remains inpatient appropriate because:IV treatments appropriate due to intensity of illness or inability to take PO   Dispo: The patient is from: Home              Anticipated d/c is to: Home              Anticipated d/c date is: > 3 days              Patient currently is not medically stable to d/c.  Consultants:  ID PCCM Psychiatry  Procedures:  None   Microbiology  None   Antimicrobials: Vancomycin / Ceftriaxone     Objective: Vitals:   08/14/19 1330 08/14/19 2125 08/15/19 0501 08/15/19 0800  BP: (!) 128/95 (!) 122/94 114/78 (!) 122/91  Pulse: (!) 110 (!) 106 99 92  Resp: 16 20 16 14   Temp: 97.7 F (36.5 C) 98.4 F (36.9 C) 98.6 F (37 C)   TempSrc: Oral  Oral   SpO2: 100% 99% 97% 98%  Weight:      Height:        Intake/Output Summary (Last 24 hours) at 08/15/2019 1109 Last data filed at 08/15/2019 0042 Gross per 24 hour  Intake 240 ml  Output --  Net 240 ml   Filed Weights   07/26/19 1858 08/10/19 1612  Weight: 94.3 kg 88.2 kg    Examination:  Constitutional: No distress, in bed, sleeping Eyes: No icterus ENMT: mmm Neck: normal, supple Respiratory: Clear bilaterally, no wheezing Cardiovascular: rrr, no mrg, no edema Musculoskeletal: no clubbing / cyanosis.  Skin: no rashes seen Neurologic: non focal, equal strength   Data Reviewed: I have independently reviewed following labs and imaging studies   CBC: Recent Labs  Lab 08/11/19 0619 08/14/19 0353  WBC 8.7 9.5  HGB 9.5* 9.1*  HCT 31.6* 29.6*  MCV 80.0 80.0  PLT 533* 423*   Basic Metabolic Panel: Recent Labs  Lab 08/09/19 0324 08/11/19 0851 08/14/19 0353  NA 131* 136 135  K 3.7 3.4* 3.2*  CL 99 104 103  CO2 22 24 23   GLUCOSE 100* 96 96  BUN 7 8 13   CREATININE 0.63 0.67 0.73  CALCIUM 8.5* 8.9 8.6*  PHOS 3.5  --   --    Liver Function Tests: Recent Labs  Lab 08/09/19 0324  ALBUMIN 3.2*   Coagulation Profile: No results for input(s): INR, PROTIME in the last  168 hours. HbA1C: No results for input(s): HGBA1C in the last 72 hours. CBG: Recent Labs  Lab 08/14/19 0719 08/14/19 1147 08/14/19 1609 08/14/19 2122 08/15/19 0738  GLUCAP 112* 110* 92 115* 94    No results found for this or any previous visit (from the past 240 hour(s)).   Radiology Studies: No results found.  Pamella Pert, MD, PhD [facility]  Between 7 am - 7 pm I am available, please contact me via Amion or Securechat  Between 7 pm - 7 am I am not available, please contact night coverage MD/APP via Amion

## 2019-08-16 ENCOUNTER — Other Ambulatory Visit: Payer: Self-pay | Admitting: Family Medicine

## 2019-08-16 DIAGNOSIS — M4626 Osteomyelitis of vertebra, lumbar region: Secondary | ICD-10-CM

## 2019-08-16 LAB — GLUCOSE, CAPILLARY
Glucose-Capillary: 97 mg/dL (ref 70–99)
Glucose-Capillary: 116 mg/dL — ABNORMAL HIGH (ref 70–99)
Glucose-Capillary: 117 mg/dL — ABNORMAL HIGH (ref 70–99)
Glucose-Capillary: 104 mg/dL — ABNORMAL HIGH (ref 70–99)

## 2019-08-16 LAB — VANCOMYCIN, TROUGH: Vancomycin Tr: 15 ug/mL (ref 15–20)

## 2019-08-16 MED ORDER — POLYETHYLENE GLYCOL 3350 17 G PO PACK
17.0000 g | PACK | Freq: Two times a day (BID) | ORAL | Status: DC
  Administered 2019-08-16 – 2019-09-05 (×28): 17 g via ORAL
  Filled 2019-08-16 (×36): qty 1

## 2019-08-16 MED ORDER — IBUPROFEN 200 MG PO TABS
400.0000 mg | ORAL_TABLET | Freq: Three times a day (TID) | ORAL | Status: DC
  Administered 2019-08-16 – 2019-09-05 (×59): 400 mg via ORAL
  Filled 2019-08-16 (×58): qty 2

## 2019-08-16 MED ORDER — VANCOMYCIN HCL 1500 MG/300ML IV SOLN
1500.0000 mg | Freq: Two times a day (BID) | INTRAVENOUS | Status: DC
  Administered 2019-08-16 – 2019-08-29 (×28): 1500 mg via INTRAVENOUS
  Filled 2019-08-16 (×31): qty 300

## 2019-08-16 NOTE — Progress Notes (Signed)
Nutrition Follow-up  DOCUMENTATION CODES:   Not applicable  INTERVENTION:  - continue Hormel Shake with breakfast meals and Magic Cup with lunch and dinner meals.   NUTRITION DIAGNOSIS:   Increased nutrient needs related to acute illness as evidenced by estimated needs. -ongoing  GOAL:   Patient will meet greater than or equal to 90% of their needs -minimally met on average  MONITOR:   PO intake, Supplement acceptance, Labs, Weight trends  ASSESSMENT:   54 year old male with medical history of HTN, prior CVA, GERD, depression/anxiety, chronic pain syndrome, alcohol abuse, and polysubstance abuse including PCP, heroin, and cocaine. He presented to the ED with tachycardia and tachypnea. He was noted to be encephalopathic and required ICU admission for precedex. There was concern for bacterial meningitis and ID recommended 14 days of abx. CT of L-spine showed osteomyelitis/discitis. ID recommended 6 weeks IV abx (to end 6/8) and patient is not a candidate for home PICC due to hx of substance abuse/use.  Patient most recently consumed 75% of lunch and 75% of dinner (total of 952 kcal, 33 grams protein) yesterday; 100% of breakfast (719 kcal, 21 grams protein) today. She has been consuming supplements ~75% of the time offered. She has not been weighed since 5/13.  Per notes: - lumbar discitis/osteomyelitis--plan for brace; IV abx to end 6/8 but patient unable to go home with PICC d/t hx of IVDU - N/V has resolved    Labs reviewed; CBG: 97 mg/dl. Medications reviewed; sliding scale novolog, 2 tablets senokot BID, 250 mg thiamine/day, 2000 mcg oral cyanocobalamin/day.    Diet Order:   Diet Order            Diet regular Room service appropriate? Yes; Fluid consistency: Thin  Diet effective now              EDUCATION NEEDS:   Not appropriate for education at this time  Skin:  Skin Assessment: Reviewed RN Assessment  Last BM:  5/16  Height:   Ht Readings from Last 1  Encounters:  07/26/19 '5\' 10"'$  (1.778 m)    Weight:   Wt Readings from Last 1 Encounters:  08/10/19 88.2 kg    Ideal Body Weight:  75.4 kg  BMI:  Body mass index is 27.89 kg/m.  Estimated Nutritional Needs:   Kcal:  2200-2400 kcal  Protein:  110-125 grams  Fluid:  >/= 2.2 L/day     Jarome Matin, MS, RD, LDN, CNSC Inpatient Clinical Dietitian RD pager # available in AMION  After hours/weekend pager # available in [facility]

## 2019-08-16 NOTE — Progress Notes (Signed)
Physical Therapy Treatment Patient Details Name: [name] MRN: [id number] DOB: Mar 16, 1966 Today's Date: 08/16/2019    History of Present Illness 54 yo male admitted with toxic metabolic encephalopathy, bacterial meningitis, unwitness fall 5/2. Hx of recent lumbar sg L3-L5 03/2019, polysubstance abuse, schizophrenia, ETOH abuse, CVA, bipolar d/o, back pain, L ankle fusion 2019, R TKA 2018, asthma.    PT Comments    General Comments: cooperative and always willing. General bed mobility comments: increased time with HOB elelvated and rail. Use of rail. Applied TLSO EOB. General transfer comment: lateral LOB with first sit to stand Therapist recovered.  "woops" pt stated.  50% VC's on use of hands to steady self. General Gait Details: pt has a Rollator in his room but I used RW due to unsteadiness and c/o hip and back pain.  25% VC's on safety with turns.  Tolerated an increased distance. Returned to room and pt agreed to sit up in recliner.  Chair alarm active.    Follow Up Recommendations  Other (comment)(per chart review pt plans to stay in hospital until completion of his IV Antibiotics approx June 8th)     Equipment Recommendations  None recommended by PT    Recommendations for Other Services       Precautions / Restrictions Precautions Precautions: Fall Precaution Comments: "bad" L hip Required Braces or Orthoses: Spinal Brace Spinal Brace: Thoracolumbosacral orthotic(applied in sitting and tightened in standing) Restrictions Weight Bearing Restrictions: No    Mobility  Bed Mobility Overal bed mobility: Needs Assistance Bed Mobility: Supine to Sit     Supine to sit: Supervision     General bed mobility comments: increased time with HOB elelvated and rail. Use of rail.  Transfers Overall transfer level: Needs assistance Equipment used: Rolling walker (2 wheeled) Transfers: Sit to/from [employer] Sit to Stand: Supervision Stand pivot transfers:  Supervision;Min guard       General transfer comment: lateral LOB with first sit to stand Therapist recovered.  "woops" pt stated.  50% VC's on use of hands to steady self.  Ambulation/Gait Ambulation/Gait assistance: Min guard;Supervision Gait Distance (Feet): 120 Feet Assistive device: Rolling walker (2 wheeled) Gait Pattern/deviations: Step-through pattern;Decreased stride length Gait velocity: decreased   General Gait Details: pt has a Rollator in his room but I used RW due to unsteadiness and c/o hip and back pain.  25% VC's on safety with turns.  Tolerated an increased distance.   Stairs             Wheelchair Mobility    Modified Rankin (Stroke Patients Only)       Balance                                            Cognition Arousal/Alertness: Awake/alert Behavior During Therapy: WFL for tasks assessed/performed Overall Cognitive Status: Within Functional Limits for tasks assessed                                 General Comments: cooperative and always willing.      Exercises      General Comments        Pertinent Vitals/Pain Pain Assessment: Faces Faces Pain Scale: Hurts little more Pain Location: back, L hip "my ass hurts" Pain Descriptors / Indicators: Grimacing;Discomfort Pain Intervention(s): Monitored during session  Home Living                      Prior Function            PT Goals (current goals can now be found in the care plan section) Progress towards PT goals: Progressing toward goals    Frequency    Min 2X/week      PT Plan Current plan remains appropriate    Co-evaluation              AM-PAC PT "6 Clicks" Mobility   Outcome Measure  Help needed turning from your back to your side while in a flat bed without using bedrails?: None Help needed moving from lying on your back to sitting on the side of a flat bed without using bedrails?: None Help needed moving to and  from a bed to a chair (including a wheelchair)?: A Little Help needed standing up from a chair using your arms (e.g., wheelchair or bedside chair)?: A Little Help needed to walk in hospital room?: A Little Help needed climbing 3-5 steps with a railing? : A Little 6 Click Score: 20    End of Session Equipment Utilized During Treatment: Gait belt Activity Tolerance: Patient tolerated treatment well Patient left: in chair;with call bell/phone within reach;with chair alarm set Nurse Communication: Mobility status(RN observed) PT Visit Diagnosis: History of falling (Z91.81);Difficulty in walking, not elsewhere classified (R26.2);Pain;Muscle weakness (generalized) (M62.81)     Time: 1352-1410 PT Time Calculation (min) (ACUTE ONLY): 18 min  Charges:  $Gait Training: 8-22 mins                     {Hyla Coard  PTA Acute  Rehabilitation [employer]      [phone] Office      [phone]

## 2019-08-16 NOTE — Progress Notes (Signed)
Pharmacy Antibiotic Note  [name] is a 54 y.o. male admitted on 07/25/2019 with altered mental status. Patient previously on Acyclovir, Vancomycin, Ampicillin, and Ceftriaxone for meningitis. Antibiotics  for meningitis completed . However,  As of 5/7 plan per ID, to continue vancomycin and rocephin for 6 weeks total - last dose on 6/8 for discitis/osteomyelitis.    Today, 08/16/19  Day 19/42 Vanc/Rocephin  WBC and SCr stable per 5/17 labs, will recheck on 5/20  Vanc Trough 13 on 5/10 - would recheck in next few days   Vanc Trough 15 on 5/19 - therapeutic   Plan: Continue vancomycin 1500mg  IV q12 Rocephin 2g IV daily per Md  Height: 5\' 10"  (177.8 cm) Weight: 88.2 kg (194 lb 6.4 oz) IBW/kg (Calculated) : 73  Temp (24hrs), Avg:98.2 F (36.8 C), Min:98.1 F (36.7 C), Max:98.4 F (36.9 C)  Recent Labs  Lab 08/11/19 0619 08/11/19 0851 08/14/19 0353 08/16/19 1030  WBC 8.7  --  9.5  --   CREATININE  --  0.67 0.73  --   VANCOTROUGH  --   --   --  15    Estimated Creatinine Clearance: 118.1 mL/min (by C-G formula based on SCr of 0.73 mg/dL).    No Known Allergies  Antimicrobials this admission: 4/28 Ceftriaxone x 1, 4/30 >> 4/28 Doxycycline >> 4/28 4/29 Ampicillin/sulbactam >> 4/30 4/30 Ampicillin>> 5/3 AM, resumed 5/3 PM >> 5/10 4/30 Acyclovir >> 5/7 4/30 Vancomycin >>    Microbiology results: 4/27 Influenza A/B: neg, Covid: neg 4/27 RPR: nonreactive 4/27 HIV: nonreactive 4/27 BCx: ngF 4/27 UCx: ngF  4/29 MRSA PCR: neg 5/2 CSF: gram stain neg,  ngF 5/2 CSF fungal: ngf 5/2 CSF HSV: ngf 5/2 CSF VDRL: ngf   Thank you for allowing pharmacy to be a part of this patient's care.     Royetta Asal, PharmD, BCPS 08/16/2019 11:31 AM

## 2019-08-16 NOTE — Progress Notes (Signed)
PROGRESS NOTE    [name]  ZOX:[id number] DOB: 09/01/1965 DOA: 07/25/2019 PCP: [name], MD    Brief Narrative:  Patient admitted to the hospital with a working diagnosis of toxic encephalopathy.  He was found to have lumbar spine osteomyelitis/discitis.  Currently on intravenous antibiotic, last dose 50/55.  54 year old male with a past medical history for schizophrenia, polysubstance abuse, asthma, prior CVA and GERD.  Patient was brought to the hospital due to lethargy and confusion by his girlfriend.  In the ED patient reported taking PCP, hearing, cocaine and alcohol the night prior to hospitalization.  On his initial physical examination blood pressure 132/95, heart rate 116, respiratory rate 16, oxygen saturation 93%, he had dry mucous membranes, his lungs were clear to auscultation bilaterally, heart S1-S2, present rhythmic, abdomen was soft, he had mild lumbar tenderness, no lower extremity edema.  Patient was initially admitted to the intensive care unit where he received dexmedetomidine, along with close neurologic monitoring.  Further work-up with CT of the lumbar spine showed osteomyelitis/discitis.  Infectious disease recommended 6 weeks of intravenous antibiotic therapy with ceftriaxone and vancomycin.  Patient is not a candidate for outpatient antibiotic therapy due to his history of persistent fevers.   Assessment & Plan:   Principal Problem:   Acute osteomyelitis of lumbar spine (HCC) Active Problems:   Nicotine dependence, cigarettes, uncomplicated   Toxic metabolic encephalopathy   Polysubstance overdose   Acute kidney injury (HCC)   Chronic pain syndrome   SIRS (systemic inflammatory response syndrome) (HCC)   Delirium   Altered mental status   1.  Acute lumbar spine osteomyelitis/discitis. Patient tolerating well antibiotic therapy with Ceftriaxone and Vancomycin IV. He continue to have back pain and difficulty ambulating. At home uses a  walker.  Will continue scheduled acetaminophen and gabapentin for pain control, will add ibuprofen tid.  Continue long acting oxycodone and as needed oxycodone IR for break through pain. Out of bed to chair tid with meals and follow with PT and OT recommendations. Continue brace for support.   2. Polysubstance abuse/ tobacco abuse/ schizophrenia. Continue bupropion, benztropine, paliperidone and thiamine. No confusion or agitation.   Continue smoking cessation counseling.   3. Toxic encephalopathy. Patient more awake and alert, continue neuro checks per unit protocol.   4. HTN. Continue with diltiazem and losartan.  5. T2DM. Continue glucose control with insulin sliding scale.   Status is: Inpatient  Remains inpatient appropriate because:IV treatments appropriate due to intensity of illness or inability to take PO   Dispo: The patient is from: Home              Anticipated d/c is to: Home              Anticipated d/c date is: > 3 days              Patient currently is not medically stable to d/c. Patient continue to need IV antibiotic therapy for severe spine infection.    DVT prophylaxis:  enoxaparin   Code Status:   full  Family Communication:  No family at the bedside      Nutrition Status: Nutrition Problem: Increased nutrient needs Etiology: acute illness Signs/Symptoms: estimated needs Interventions: Hormel Shake, Magic cup, MVI     Consultants:   ID   Procedures:   Picc line   Antimicrobials:   Ceftriaxone and vancomycin     Subjective: Patient continue to have lumbar spine pain, worse with movement, no radiation, no associated nausea or  vomiting.   Objective: Vitals:   08/15/19 2125 08/16/19 0547 08/16/19 0909 08/16/19 1210  BP: (!) 141/95 128/87 125/82 (!) 127/93  Pulse: (!) 101 96  (!) 106  Resp: 17 18  17   Temp: 98.4 F (36.9 C) 98.1 F (36.7 C)  98.7 F (37.1 C)  TempSrc:      SpO2: 100% 98%  98%  Weight:      Height:         Intake/Output Summary (Last 24 hours) at 08/16/2019 1355 Last data filed at 08/16/2019 1610 Gross per 24 hour  Intake 1120 ml  Output --  Net 1120 ml   Filed Weights   07/26/19 1858 08/10/19 1612  Weight: 94.3 kg 88.2 kg    Examination:   General: Not in pain or dyspnea , deconditioned  Neurology: Awake and alert, non focal  E ENT: no pallor, no icterus, oral mucosa moist Cardiovascular: No JVD. S1-S2 present, rhythmic, no gallops, rubs, or murmurs. No lower extremity edema. Pulmonary: positive breath sounds bilaterally, adequate air movement, no wheezing, rhonchi or rales. Gastrointestinal. Abdomen with no organomegaly, non tender, no rebound or guarding Skin. No rashes Musculoskeletal: no joint deformities     Data Reviewed: I have personally reviewed following labs and imaging studies  CBC: Recent Labs  Lab 08/11/19 0619 08/14/19 0353  WBC 8.7 9.5  HGB 9.5* 9.1*  HCT 31.6* 29.6*  MCV 80.0 80.0  PLT 533* 423*   Basic Metabolic Panel: Recent Labs  Lab 08/11/19 0851 08/14/19 0353  NA 136 135  K 3.4* 3.2*  CL 104 103  CO2 24 23  GLUCOSE 96 96  BUN 8 13  CREATININE 0.67 0.73  CALCIUM 8.9 8.6*   GFR: Estimated Creatinine Clearance: 118.1 mL/min (by C-G formula based on SCr of 0.73 mg/dL). Liver Function Tests: No results for input(s): AST, ALT, ALKPHOS, BILITOT, PROT, ALBUMIN in the last 168 hours. No results for input(s): LIPASE, AMYLASE in the last 168 hours. No results for input(s): AMMONIA in the last 168 hours. Coagulation Profile: No results for input(s): INR, PROTIME in the last 168 hours. Cardiac Enzymes: No results for input(s): CKTOTAL, CKMB, CKMBINDEX, TROPONINI in the last 168 hours. BNP (last 3 results) No results for input(s): PROBNP in the last 8760 hours. HbA1C: No results for input(s): HGBA1C in the last 72 hours. CBG: Recent Labs  Lab 08/15/19 1119 08/15/19 1637 08/15/19 2202 08/16/19 0724 08/16/19 1144  GLUCAP 92 190* 116*  97 116*   Lipid Profile: No results for input(s): CHOL, HDL, LDLCALC, TRIG, CHOLHDL, LDLDIRECT in the last 72 hours. Thyroid Function Tests: No results for input(s): TSH, T4TOTAL, FREET4, T3FREE, THYROIDAB in the last 72 hours. Anemia Panel: No results for input(s): VITAMINB12, FOLATE, FERRITIN, TIBC, IRON, RETICCTPCT in the last 72 hours.    Radiology Studies: I have reviewed all of the imaging during this hospital visit personally     Scheduled Meds: . acetaminophen  1,000 mg Oral Q8H  . benztropine  1 mg Oral Daily  . buPROPion  300 mg Oral Daily  . Chlorhexidine Gluconate Cloth  6 each Topical Daily  . Chlorhexidine Gluconate Cloth  6 each Topical Daily  . diltiazem  240 mg Oral Daily  . enoxaparin (LOVENOX) injection  40 mg Subcutaneous Daily  . folic acid  1 mg Oral Daily  . gabapentin  300 mg Oral BID  . ibuprofen  400 mg Oral TID  . ibuprofen  800 mg Oral Once  . insulin aspart  0-5  Units Subcutaneous QHS  . insulin aspart  0-9 Units Subcutaneous TID WC  . losartan  50 mg Oral Daily  . multivitamin with minerals  1 tablet Oral Daily  . nicotine  21 mg Transdermal Daily  . oxyCODONE  10 mg Oral Q12H  . paliperidone  234 mg Intramuscular Q30 days  . pantoprazole  40 mg Oral Daily  . polyethylene glycol  17 g Oral BID  . thiamine  250 mg Oral Daily  . vitamin B-12  2,000 mcg Oral Daily   Continuous Infusions: . sodium chloride Stopped (07/28/19 1838)  . cefTRIAXone (ROCEPHIN)  IV 2 g (08/16/19 0921)  . vancomycin 1,500 mg (08/16/19 1207)     LOS: 21 days        [name] Annett Gula, MD

## 2019-08-17 LAB — GLUCOSE, CAPILLARY
Glucose-Capillary: 95 mg/dL (ref 70–99)
Glucose-Capillary: 109 mg/dL — ABNORMAL HIGH (ref 70–99)
Glucose-Capillary: 100 mg/dL — ABNORMAL HIGH (ref 70–99)
Glucose-Capillary: 120 mg/dL — ABNORMAL HIGH (ref 70–99)

## 2019-08-17 LAB — BASIC METABOLIC PANEL
Anion gap: 9 (ref 5–15)
BUN: 10 mg/dL (ref 6–20)
CO2: 25 mmol/L (ref 22–32)
Calcium: 8.9 mg/dL (ref 8.9–10.3)
Chloride: 102 mmol/L (ref 98–111)
Creatinine, Ser: 0.59 mg/dL — ABNORMAL LOW (ref 0.61–1.24)
GFR calc Af Amer: >60 mL/min (ref >60)
GFR calc non Af Amer: >60 mL/min (ref >60)
Glucose, Bld: 141 mg/dL — ABNORMAL HIGH (ref 70–99)
Potassium: 3.9 mmol/L (ref 3.5–5.1)
Sodium: 136 mmol/L (ref 135–145)

## 2019-08-17 LAB — CREATININE, SERUM
Creatinine, Ser: 0.65 mg/dL (ref 0.61–1.24)
GFR calc Af Amer: >60 mL/min (ref >60)
GFR calc non Af Amer: >60 mL/min (ref >60)

## 2019-08-17 MED ORDER — LIDOCAINE 5 % EX PTCH
1.0000 | MEDICATED_PATCH | CUTANEOUS | Status: DC
  Administered 2019-08-17 – 2019-09-04 (×19): 1 via TRANSDERMAL
  Filled 2019-08-17 (×21): qty 1

## 2019-08-17 NOTE — Progress Notes (Signed)
PROGRESS NOTE    [name]  WJX:[id number] DOB: May 13, 1965 DOA: 07/25/2019 PCP: [name], MD    Brief Narrative:  Patient admitted to the hospital with a working diagnosis of toxic encephalopathy.  He was found to have lumbar spine osteomyelitis/discitis.  Currently on intravenous antibiotic, last dose 34/70.  54 year old male with a past medical history for schizophrenia, polysubstance abuse, asthma, prior CVA and GERD.  Patient was brought to the hospital due to lethargy and confusion by his girlfriend.  In the ED patient reported taking PCP, hearing, cocaine and alcohol the night prior to hospitalization.  On his initial physical examination blood pressure 132/95, heart rate 116, respiratory rate 16, oxygen saturation 93%, he had dry mucous membranes, his lungs were clear to auscultation bilaterally, heart S1-S2, present rhythmic, abdomen was soft, he had mild lumbar tenderness, no lower extremity edema.  Patient was initially admitted to the intensive care unit where he received dexmedetomidine, along with close neurologic monitoring.  Further work-up with CT of the lumbar spine showed osteomyelitis/discitis.  Infectious disease recommended 6 weeks of intravenous antibiotic therapy with ceftriaxone and vancomycin.  Patient is not a candidate for outpatient antibiotic therapy due to his history of persistent fevers.   Assessment & Plan:   Principal Problem:   Acute osteomyelitis of lumbar spine (HCC) Active Problems:   Nicotine dependence, cigarettes, uncomplicated   Toxic metabolic encephalopathy   Polysubstance overdose   Acute kidney injury (HCC)   Chronic pain syndrome   SIRS (systemic inflammatory response syndrome) (HCC)   Delirium   Altered mental status    1.  Acute lumbar spine osteomyelitis/discitis. Continue antibiotic therapy with Ceftriaxone and Vancomycin IV. Positive back pain and difficulty ambulating. Has a spine brace for ambulation.  Patient on  scheduled acetaminophen, gabapentin and ibuprofen for pain control.  On long acting oxycodone and as needed oxycodone IR for break through pain. Will add local lidocaine patch.   Continue to encourage out of bed to chair tid with meals and follow with PT and OT recommendations.   2. Polysubstance abuse/ tobacco abuse/ schizophrenia. On bupropion, benztropine, paliperidone and thiamine. Patient with confusion or agitation.   Smoking cessation counseling.   3. Toxic encephalopathy. Patient is awake and alert. Encephalopathy has resolved.   4. HTN. On diltiazem and losartan for blood pressure control.  5. T2DM. On insulin sliding scale for glucose control.  Status is: Inpatient  Remains inpatient appropriate because:Unsafe d/c plan   Dispo: The patient is from: Home              Anticipated d/c is to: Home              Anticipated d/c date is: > 3 days              Patient currently is not medically stable to d/c.    DVT prophylaxis: Enoxaparin   Code Status:   full  Family Communication:  No family at the bedside      Nutrition Status: Nutrition Problem: Increased nutrient needs Etiology: acute illness Signs/Symptoms: estimated needs Interventions: Hormel Shake, Magic cup, MVI    Consultants:   ID  Procedures:   Ceftriaxone and vancomycin       Subjective: Patient continue to have back pain, worse to touch and movement, no radiation, he is able to ambulate with walker and using a spine brace.  Objective: Vitals:   08/16/19 1210 08/16/19 2058 08/17/19 0440 08/17/19 1340  BP: (!) 127/93 (!) 119/98 116/89 Marland Kitchen)  120/93  Pulse: (!) 106 99 87 (!) 110  Resp: 17 18  18   Temp: 98.7 F (37.1 C) 98.4 F (36.9 C) 98.1 F (36.7 C) 98.2 F (36.8 C)  TempSrc:  Oral Oral   SpO2: 98% 100% 100% 98%  Weight:      Height:        Intake/Output Summary (Last 24 hours) at 08/17/2019 1402 Last data filed at 08/17/2019 0955 Gross per 24 hour  Intake 1739.83 ml  Output  600 ml  Net 1139.83 ml   Filed Weights   07/26/19 1858 08/10/19 1612  Weight: 94.3 kg 88.2 kg    Examination:   General: Not in pain or dyspnea  Neurology: Awake and alert, non focal  E ENT: no pallor, no icterus, oral mucosa moist Cardiovascular: No JVD. S1-S2 present, rhythmic, no gallops, rubs, or murmurs. No lower extremity edema. Pulmonary: positive breath sounds bilaterally, adequate air movement, no wheezing, rhonchi or rales. Gastrointestinal. Abdomen with no organomegaly, non tender, no rebound or guarding Skin. No rashes Musculoskeletal: tender to palpation at the lower lumbar spine     Data Reviewed: I have personally reviewed following labs and imaging studies  CBC: Recent Labs  Lab 08/11/19 0619 08/14/19 0353  WBC 8.7 9.5  HGB 9.5* 9.1*  HCT 31.6* 29.6*  MCV 80.0 80.0  PLT 533* 423*   Basic Metabolic Panel: Recent Labs  Lab 08/11/19 0851 08/14/19 0353 08/17/19 0345 08/17/19 0954  NA 136 135  --  136  K 3.4* 3.2*  --  3.9  CL 104 103  --  102  CO2 24 23  --  25  GLUCOSE 96 96  --  141*  BUN 8 13  --  10  CREATININE 0.67 0.73 0.65 0.59*  CALCIUM 8.9 8.6*  --  8.9   GFR: Estimated Creatinine Clearance: 118.1 mL/min (A) (by C-G formula based on SCr of 0.59 mg/dL (L)). Liver Function Tests: No results for input(s): AST, ALT, ALKPHOS, BILITOT, PROT, ALBUMIN in the last 168 hours. No results for input(s): LIPASE, AMYLASE in the last 168 hours. No results for input(s): AMMONIA in the last 168 hours. Coagulation Profile: No results for input(s): INR, PROTIME in the last 168 hours. Cardiac Enzymes: No results for input(s): CKTOTAL, CKMB, CKMBINDEX, TROPONINI in the last 168 hours. BNP (last 3 results) No results for input(s): PROBNP in the last 8760 hours. HbA1C: No results for input(s): HGBA1C in the last 72 hours. CBG: Recent Labs  Lab 08/16/19 1144 08/16/19 1611 08/16/19 2054 08/17/19 0740 08/17/19 1131  GLUCAP 116* 117* 104* 95 109*    Lipid Profile: No results for input(s): CHOL, HDL, LDLCALC, TRIG, CHOLHDL, LDLDIRECT in the last 72 hours. Thyroid Function Tests: No results for input(s): TSH, T4TOTAL, FREET4, T3FREE, THYROIDAB in the last 72 hours. Anemia Panel: No results for input(s): VITAMINB12, FOLATE, FERRITIN, TIBC, IRON, RETICCTPCT in the last 72 hours.    Radiology Studies: I have reviewed all of the imaging during this hospital visit personally     Scheduled Meds: . acetaminophen  1,000 mg Oral Q8H  . benztropine  1 mg Oral Daily  . buPROPion  300 mg Oral Daily  . Chlorhexidine Gluconate Cloth  6 each Topical Daily  . Chlorhexidine Gluconate Cloth  6 each Topical Daily  . diltiazem  240 mg Oral Daily  . enoxaparin (LOVENOX) injection  40 mg Subcutaneous Daily  . folic acid  1 mg Oral Daily  . gabapentin  300 mg Oral BID  .  ibuprofen  400 mg Oral TID  . ibuprofen  800 mg Oral Once  . insulin aspart  0-5 Units Subcutaneous QHS  . insulin aspart  0-9 Units Subcutaneous TID WC  . lidocaine  1 patch Transdermal Q24H  . losartan  50 mg Oral Daily  . multivitamin with minerals  1 tablet Oral Daily  . nicotine  21 mg Transdermal Daily  . oxyCODONE  10 mg Oral Q12H  . paliperidone  234 mg Intramuscular Q30 days  . pantoprazole  40 mg Oral Daily  . polyethylene glycol  17 g Oral BID  . thiamine  250 mg Oral Daily  . vitamin B-12  2,000 mcg Oral Daily   Continuous Infusions: . sodium chloride Stopped (07/28/19 1838)  . cefTRIAXone (ROCEPHIN)  IV 2 g (08/17/19 1304)  . vancomycin 1,500 mg (08/17/19 0914)     LOS: 22 days        Rosco Harriott Annett Gula, MD

## 2019-08-17 NOTE — Telephone Encounter (Signed)
Attempted to reach pt. Phone line was busy. Will try later. To make f/up appt. Salvatore Marvel, CMA

## 2019-08-17 NOTE — Plan of Care (Signed)
  Problem: Education: Goal: Knowledge of General Education information will improve Description Including pain rating scale, medication(s)/side effects and non-pharmacologic comfort measures Outcome: Progressing   

## 2019-08-18 ENCOUNTER — Other Ambulatory Visit: Payer: Self-pay | Admitting: Specialist

## 2019-08-18 LAB — GLUCOSE, CAPILLARY
Glucose-Capillary: 81 mg/dL (ref 70–99)
Glucose-Capillary: 133 mg/dL — ABNORMAL HIGH (ref 70–99)
Glucose-Capillary: 92 mg/dL (ref 70–99)
Glucose-Capillary: 106 mg/dL — ABNORMAL HIGH (ref 70–99)

## 2019-08-18 NOTE — Progress Notes (Signed)
Occupational Therapy Treatment Patient Details Name: [name] MRN: [id number] DOB: 1965/10/15 Today's Date: 08/18/2019    History of present illness 54 yo male admitted with toxic metabolic encephalopathy, bacterial meningitis, unwitness fall 5/2. Hx of recent lumbar sg L3-L5 03/2019, polysubstance abuse, schizophrenia, ETOH abuse, CVA, bipolar d/o, back pain, L ankle fusion 2019, R TKA 2018, asthma.   OT comments  Treatment focused on improving patient's independence with mobility and ADLs. Patient stood at sink to perform shaving and grooming task. Patient continues to have mild impairments of balance requiring cga assist for safety and impulsive movements placing his PICC line at risk. Goals down graded from modified independent to supervision as patient appears to need 24/7 supervision due to impulsivity and unsafe movements.   Follow Up Recommendations  No OT follow up;Other (comment)    Equipment Recommendations  None recommended by OT    Recommendations for Other Services      Precautions / Restrictions Precautions Precautions: Fall Required Braces or Orthoses: Spinal Brace Spinal Brace: Thoracolumbosacral orthotic Restrictions Weight Bearing Restrictions: No       Mobility Bed Mobility                  Transfers                 General transfer comment: Patient seated in recliner when therapist entered the room. Patient performed sit to stand from recliner without assistance. Therapist assisted patient with donning TLSO. Patient ambulated into bathroom with RW and min guard. Patient's movement fast and patient not overly aware of IV line requiring verbal cues for safety. Patient able to perform toilet transfer with cga.    Balance Overall balance assessment: Mild deficits observed, not formally tested                                         ADL either performed or assessed with clinical judgement   ADL         Grooming  Details (indicate cue type and reason): Patient stood at sink to wash face and shave approx 10 minutes with cga. Patient reports pain in back at all times but states "It's all right." Cueing for safety to maintain slack on IV line.                   Toilet Transfer Details (indicate cue type and reason): cga for toilet transfer and use of RW and grab bar to stand from regular toilet.   Toileting - Clothing Manipulation Details (indicate cue type and reason): cga for toileting.             Vision       Perception     Praxis      Cognition Arousal/Alertness: Awake/alert Behavior During Therapy: WFL for tasks assessed/performed Overall Cognitive Status: Within Functional Limits for tasks assessed                                 General Comments: Patient is always impulsive with movement and reports this is baseline. Patient never seems to remember therapist - so concerns for some memory deficits.        Exercises     Shoulder Instructions       General Comments      Pertinent Vitals/ Pain  Pain Assessment: 0-10 Pain Score: 9   Home Living                                          Prior Functioning/Environment              Frequency           Progress Toward Goals  OT Goals(current goals can now be found in the care plan section)  Progress towards OT goals: Goals drowngraded-see care plan(Patient is able to perform almost all aspects of self care - except pulling his hair back due to chronic rotator cuff injury limited him. Patient's main deficit is his impulsiveness and impaired balance.)  ADL Goals Pt Will Perform Grooming: with supervision;standing Pt Will Perform Lower Body Dressing: with supervision;sit to/from stand Pt Will Transfer to Toilet: with supervision;ambulating;regular height toilet Pt Will Perform Toileting - Clothing Manipulation and hygiene: with supervision;sit to/from stand  Plan       Co-evaluation                 AM-PAC OT "6 Clicks" Daily Activity     Outcome Measure   Help from another person eating meals?: None Help from another person taking care of personal grooming?: A Little Help from another person toileting, which includes using toliet, bedpan, or urinal?: A Little Help from another person bathing (including washing, rinsing, drying)?: A Little Help from another person to put on and taking off regular upper body clothing?: A Little Help from another person to put on and taking off regular lower body clothing?: A Little 6 Click Score: 19    End of Session Equipment Utilized During Treatment: Rolling walker;Back brace  OT Visit Diagnosis: Unsteadiness on feet (R26.81);Pain   Activity Tolerance Patient tolerated treatment well   Patient Left with call bell/phone within reach;in bed;with bed alarm set   Nurse Communication Mobility status        Time: 8413-2440 OT Time Calculation (min): 18 min  Charges: OT General Charges $OT Visit: 1 Visit OT Treatments $Self Care/Home Management : 8-22 mins  Waldron Session, OTR/L Acute Care Rehab Services  Office [phone]    Kelli Churn 08/18/2019, 4:55 PM

## 2019-08-18 NOTE — Plan of Care (Signed)
  Problem: Education: Goal: Knowledge of General Education information will improve Description: Including pain rating scale, medication(s)/side effects and non-pharmacologic comfort measures Outcome: Progressing   Problem: Health Behavior/Discharge Planning: Goal: Ability to manage health-related needs will improve Outcome: Progressing   Problem: Clinical Measurements: Goal: Diagnostic test results will improve Outcome: Progressing   

## 2019-08-18 NOTE — Progress Notes (Signed)
Pt Mews: YELLOW, MD made aware pt sleeping at current time. Arouses easily, no acute distress. Constant Chronic pain 6-8, pain med. Will cont to monitor. SRP, RN

## 2019-08-18 NOTE — Progress Notes (Signed)
PROGRESS NOTE    [name]  ZOX:[id number] DOB: 05-16-1965 DOA: 07/25/2019 PCP: [name], MD    Brief Narrative:  Patient admitted to the hospital with a working diagnosis of toxic encephalopathy. He was found to have lumbar spine osteomyelitis/discitis. Currently on intravenous antibiotic, last dose 54/36.  54 year old male with a past medical history for schizophrenia, polysubstance abuse, asthma, prior CVA and GERD. Patient was brought to the hospital due to lethargy and confusion by his girlfriend. In the ED patient reported taking PCP, hearing, cocaine and alcohol the night prior to hospitalization. On his initial physical examination blood pressure 132/95, heart rate 116, respiratory rate 16, oxygen saturation 93%, he had dry mucous membranes, his lungs were clear to auscultation bilaterally, heart S1-S2, present rhythmic, abdomen was soft, he had mild lumbar tenderness, no lower extremity edema.  Patient was initially admitted to the intensive care unit where he received dexmedetomidine, along with close neurologic monitoring. Further work-up with CT of the lumbar spine showed osteomyelitis/discitis. Infectious disease recommended 6 weeks of intravenous antibiotic therapy with ceftriaxone and vancomycin.  Patient is not a candidate for outpatient antibiotic therapy due to his history of persistent fevers.   Assessment & Plan:   Principal Problem:   Acute osteomyelitis of lumbar spine (HCC) Active Problems:   Nicotine dependence, cigarettes, uncomplicated   Toxic metabolic encephalopathy   Polysubstance overdose   Acute kidney injury (HCC)   Chronic pain syndrome   SIRS (systemic inflammatory response syndrome) (HCC)   Delirium   Altered mental status    1.Acute lumbar spine osteomyelitis/discitis. Tolerating well antibiotic therapy with Ceftriaxone and Vancomycin IV.   Continue pain control with scheduled acetaminophen, gabapentin ibuprofen and  lidocaine patch.  Patient on long acting oxycodone and as needed oxycodone IR for break through pain.   Out of bed to chair tid with meals.   2. Polysubstance abuse/ tobacco abuse/ schizophrenia. Continue with bupropion, benztropine, paliperidone and thiamine.   Continue with smoking cessation counseling.   3. Toxic encephalopathy. Patient is awake and alert.   4. HTN. continue with diltiazem and losartan for blood pressure control.  5. T2DM. Continue with insulin sliding scale for glucose control. Capillary glucose 120, 81, and 133.    Status is: Inpatient  Remains inpatient appropriate because:IV treatments appropriate due to intensity of illness or inability to take PO   Dispo: The patient is from: Home              Anticipated d/c is to: Home              Anticipated d/c date is: > 3 days              Patient currently is not medically stable to d/c.   DVT prophylaxis: Enoxaparin   Code Status:   full  Family Communication:  No family at the bedside      Nutrition Status: Nutrition Problem: Increased nutrient needs Etiology: acute illness Signs/Symptoms: estimated needs Interventions: Hormel Shake, Magic cup, MVI    Subjective: Patient is feeling better, continue to have back pain, but improved with analgesics, not yet back to baseline, no nausea or vomiting.   Objective: Vitals:   08/17/19 2200 08/18/19 0157 08/18/19 0542 08/18/19 1001  BP: (!) 128/96 115/80 111/81 (!) 133/97  Pulse: 99 98 (!) 101 (!) 108  Resp: 20 20 20 20   Temp: 98.6 F (37 C) 98.6 F (37 C) 98.8 F (37.1 C) (!) 97.5 F (36.4 C)  TempSrc:  Oral   SpO2: 99% 98% 97%   Weight:      Height:        Intake/Output Summary (Last 24 hours) at 08/18/2019 1244 Last data filed at 08/18/2019 1113 Gross per 24 hour  Intake 1000 ml  Output --  Net 1000 ml   Filed Weights   07/26/19 1858 08/10/19 1612  Weight: 94.3 kg 88.2 kg    Examination:   General: Not in pain or dyspnea,  deconditioned  Neurology: Awake and alert, non focal  E ENT: no pallor, no icterus, oral mucosa moist Cardiovascular: No JVD. S1-S2 present, rhythmic, no gallops, rubs, or murmurs. No lower extremity edema. Pulmonary: positive breath sounds bilaterally, adequate air movement, no wheezing, rhonchi or rales. Gastrointestinal. Abdomen with no organomegaly, non tender, no rebound or guarding Skin. No rashes Musculoskeletal: no joint deformities     Data Reviewed: I have personally reviewed following labs and imaging studies  CBC: Recent Labs  Lab 08/14/19 0353  WBC 9.5  HGB 9.1*  HCT 29.6*  MCV 80.0  PLT 423*   Basic Metabolic Panel: Recent Labs  Lab 08/14/19 0353 08/17/19 0345 08/17/19 0954  NA 135  --  136  K 3.2*  --  3.9  CL 103  --  102  CO2 23  --  25  GLUCOSE 96  --  141*  BUN 13  --  10  CREATININE 0.73 0.65 0.59*  CALCIUM 8.6*  --  8.9   GFR: Estimated Creatinine Clearance: 118.1 mL/min (A) (by C-G formula based on SCr of 0.59 mg/dL (L)). Liver Function Tests: No results for input(s): AST, ALT, ALKPHOS, BILITOT, PROT, ALBUMIN in the last 168 hours. No results for input(s): LIPASE, AMYLASE in the last 168 hours. No results for input(s): AMMONIA in the last 168 hours. Coagulation Profile: No results for input(s): INR, PROTIME in the last 168 hours. Cardiac Enzymes: No results for input(s): CKTOTAL, CKMB, CKMBINDEX, TROPONINI in the last 168 hours. BNP (last 3 results) No results for input(s): PROBNP in the last 8760 hours. HbA1C: No results for input(s): HGBA1C in the last 72 hours. CBG: Recent Labs  Lab 08/17/19 1131 08/17/19 1640 08/17/19 2109 08/18/19 0750 08/18/19 1111  GLUCAP 109* 100* 120* 81 133*   Lipid Profile: No results for input(s): CHOL, HDL, LDLCALC, TRIG, CHOLHDL, LDLDIRECT in the last 72 hours. Thyroid Function Tests: No results for input(s): TSH, T4TOTAL, FREET4, T3FREE, THYROIDAB in the last 72 hours. Anemia Panel: No results  for input(s): VITAMINB12, FOLATE, FERRITIN, TIBC, IRON, RETICCTPCT in the last 72 hours.    Radiology Studies: I have reviewed all of the imaging during this hospital visit personally     Scheduled Meds: . acetaminophen  1,000 mg Oral Q8H  . benztropine  1 mg Oral Daily  . buPROPion  300 mg Oral Daily  . Chlorhexidine Gluconate Cloth  6 each Topical Daily  . Chlorhexidine Gluconate Cloth  6 each Topical Daily  . diltiazem  240 mg Oral Daily  . enoxaparin (LOVENOX) injection  40 mg Subcutaneous Daily  . folic acid  1 mg Oral Daily  . gabapentin  300 mg Oral BID  . ibuprofen  400 mg Oral TID  . insulin aspart  0-5 Units Subcutaneous QHS  . insulin aspart  0-9 Units Subcutaneous TID WC  . lidocaine  1 patch Transdermal Q24H  . losartan  50 mg Oral Daily  . multivitamin with minerals  1 tablet Oral Daily  . nicotine  21 mg Transdermal  Daily  . oxyCODONE  10 mg Oral Q12H  . paliperidone  234 mg Intramuscular Q30 days  . pantoprazole  40 mg Oral Daily  . polyethylene glycol  17 g Oral BID  . thiamine  250 mg Oral Daily  . vitamin B-12  2,000 mcg Oral Daily   Continuous Infusions: . sodium chloride Stopped (07/28/19 1838)  . cefTRIAXone (ROCEPHIN)  IV 2 g (08/18/19 1105)  . vancomycin 1,500 mg (08/18/19 0850)     LOS: 23 days        Terri Rorrer Annett Gula, MD

## 2019-08-19 LAB — GLUCOSE, CAPILLARY
Glucose-Capillary: 89 mg/dL (ref 70–99)
Glucose-Capillary: 123 mg/dL — ABNORMAL HIGH (ref 70–99)
Glucose-Capillary: 104 mg/dL — ABNORMAL HIGH (ref 70–99)
Glucose-Capillary: 114 mg/dL — ABNORMAL HIGH (ref 70–99)

## 2019-08-19 NOTE — Progress Notes (Signed)
PROGRESS NOTE    [name]  QIH:[id number] DOB: 10/09/1965 DOA: 07/25/2019 PCP: [name], MD    Brief Narrative:  Patient admitted to the hospital with a working diagnosis of toxic encephalopathy. He was found to have lumbar spine osteomyelitis/discitis. Currently on intravenous antibiotic, last dose 42/55.  54 year old male with a past medical history for schizophrenia, polysubstance abuse, asthma, prior CVA and GERD. Patient was brought to the hospital due to lethargy and confusion by his girlfriend. In the ED patient reported taking PCP, hearing, cocaine and alcohol the night prior to hospitalization. On his initial physical examination blood pressure 132/95, heart rate 116, respiratory rate 16, oxygen saturation 93%, he had dry mucous membranes, his lungs were clear to auscultation bilaterally, heart S1-S2, present rhythmic, abdomen was soft, he had mild lumbar tenderness, no lower extremity edema.  Patient was initially admitted to the intensive care unit where he received dexmedetomidine, along with close neurologic monitoring. Further work-up with CT of the lumbar spine showed osteomyelitis/discitis. Infectious disease recommended 6 weeks of intravenous antibiotic therapy with ceftriaxone and vancomycin.  Patient is not a candidate for outpatient antibiotic therapy due to his history of persistent fevers.  Pain controlled with analgesics and antiinflammatories.   Assessment & Plan:   Principal Problem:   Acute osteomyelitis of lumbar spine (HCC) Active Problems:   Nicotine dependence, cigarettes, uncomplicated   Toxic metabolic encephalopathy   Polysubstance overdose   Acute kidney injury (HCC)   Chronic pain syndrome   SIRS (systemic inflammatory response syndrome) (HCC)   Delirium   Altered mental status   1.Acute lumbar spine osteomyelitis/discitis.Continue antibiotic therapy with Ceftriaxone and Vancomycin IV.   Pain control with scheduled  acetaminophen,gabapentin, ibuprofen and lidocaine patch. On long acting oxycodone and as needed oxycodone IR for break through pain.  Patient complains of persistent pain, but does not correlate with his physical examination and appearance.   Out of bed to chair tid with meals, mobility with physical and occupational therapy.   2. Polysubstance abuse/ tobacco abuse/ schizophrenia.On bupropion, benztropine, paliperidone and thiamine.   Smoking cessation counseling.   3. Toxic encephalopathy.resolved, he isawake and alert. Continue with thiamine.   4. HTN.Ondiltiazem and losartan with good blood pressure control.   5.T2DM (Hgb A1 c 6,6) On nsulin sliding scale for glucose control. Patient tolerating poi well.   Status is: Inpatient  Remains inpatient appropriate because:IV treatments appropriate due to intensity of illness or inability to take PO   Dispo: The patient is from: Home              Anticipated d/c is to: Home              Anticipated d/c date is: > 3 days              Patient currently is not medically stable to d/c.   DVT prophylaxis: Enoxaparin   Code Status:   full  Family Communication:  No family at the bedside      Nutrition Status: Nutrition Problem: Increased nutrient needs Etiology: acute illness Signs/Symptoms: estimated needs Interventions: Hormel Shake, Magic cup, MVI      Subjective: Patient continue to have back pain, no nausea or vomiting, and able to ambulate with walker and spine brace.   Objective: Vitals:   08/18/19 1001 08/18/19 1320 08/18/19 2122 08/19/19 0555  BP: (!) 133/97 111/83 (!) 126/96 (!) 141/92  Pulse: (!) 108 (!) 101 97 89  Resp: 20 16 16 16   Temp: (!) 97.5 F (  36.4 C) 98.1 F (36.7 C) 98.4 F (36.9 C) 98 F (36.7 C)  TempSrc:  Oral Oral Oral  SpO2:  97% 97% 98%  Weight:      Height:        Intake/Output Summary (Last 24 hours) at 08/19/2019 1118 Last data filed at 08/19/2019 0806 Gross per 24 hour   Intake 720 ml  Output 3400 ml  Net -2680 ml   Filed Weights   07/26/19 1858 08/10/19 1612  Weight: 94.3 kg 88.2 kg    Examination:   General: Not in pain or dyspnea.  Neurology: Awake and alert, non focal  E ENT: no pallor, no icterus, oral mucosa moist Cardiovascular: No JVD. S1-S2 present, rhythmic, no gallops, rubs, or murmurs. No lower extremity edema. Pulmonary: vesicular breath sounds bilaterally, no wheezing, rhonchi or rales. Gastrointestinal. Abdomen with, no organomegaly, non tender, no rebound or guarding Skin. No rashes Musculoskeletal: no joint deformities     Data Reviewed: I have personally reviewed following labs and imaging studies  CBC: Recent Labs  Lab 08/14/19 0353  WBC 9.5  HGB 9.1*  HCT 29.6*  MCV 80.0  PLT 423*   Basic Metabolic Panel: Recent Labs  Lab 08/14/19 0353 08/17/19 0345 08/17/19 0954  NA 135  --  136  K 3.2*  --  3.9  CL 103  --  102  CO2 23  --  25  GLUCOSE 96  --  141*  BUN 13  --  10  CREATININE 0.73 0.65 0.59*  CALCIUM 8.6*  --  8.9   GFR: Estimated Creatinine Clearance: 118.1 mL/min (A) (by C-G formula based on SCr of 0.59 mg/dL (L)). Liver Function Tests: No results for input(s): AST, ALT, ALKPHOS, BILITOT, PROT, ALBUMIN in the last 168 hours. No results for input(s): LIPASE, AMYLASE in the last 168 hours. No results for input(s): AMMONIA in the last 168 hours. Coagulation Profile: No results for input(s): INR, PROTIME in the last 168 hours. Cardiac Enzymes: No results for input(s): CKTOTAL, CKMB, CKMBINDEX, TROPONINI in the last 168 hours. BNP (last 3 results) No results for input(s): PROBNP in the last 8760 hours. HbA1C: No results for input(s): HGBA1C in the last 72 hours. CBG: Recent Labs  Lab 08/18/19 0750 08/18/19 1111 08/18/19 1653 08/18/19 2119 08/19/19 0745  GLUCAP 81 133* 92 106* 89   Lipid Profile: No results for input(s): CHOL, HDL, LDLCALC, TRIG, CHOLHDL, LDLDIRECT in the last 72  hours. Thyroid Function Tests: No results for input(s): TSH, T4TOTAL, FREET4, T3FREE, THYROIDAB in the last 72 hours. Anemia Panel: No results for input(s): VITAMINB12, FOLATE, FERRITIN, TIBC, IRON, RETICCTPCT in the last 72 hours.    Radiology Studies: I have reviewed all of the imaging during this hospital visit personally     Scheduled Meds: . acetaminophen  1,000 mg Oral Q8H  . benztropine  1 mg Oral Daily  . buPROPion  300 mg Oral Daily  . Chlorhexidine Gluconate Cloth  6 each Topical Daily  . Chlorhexidine Gluconate Cloth  6 each Topical Daily  . diltiazem  240 mg Oral Daily  . enoxaparin (LOVENOX) injection  40 mg Subcutaneous Daily  . folic acid  1 mg Oral Daily  . gabapentin  300 mg Oral BID  . ibuprofen  400 mg Oral TID  . insulin aspart  0-5 Units Subcutaneous QHS  . insulin aspart  0-9 Units Subcutaneous TID WC  . lidocaine  1 patch Transdermal Q24H  . losartan  50 mg Oral Daily  .  multivitamin with minerals  1 tablet Oral Daily  . nicotine  21 mg Transdermal Daily  . oxyCODONE  10 mg Oral Q12H  . paliperidone  234 mg Intramuscular Q30 days  . pantoprazole  40 mg Oral Daily  . polyethylene glycol  17 g Oral BID  . thiamine  250 mg Oral Daily  . vitamin B-12  2,000 mcg Oral Daily   Continuous Infusions: . sodium chloride Stopped (07/28/19 1838)  . cefTRIAXone (ROCEPHIN)  IV Stopped (08/18/19 2141)  . vancomycin 1,500 mg (08/19/19 1112)     LOS: 24 days        Brian Chain Annett Gula, MD

## 2019-08-19 NOTE — Progress Notes (Signed)
Pharmacy Antibiotic Note  [name] is a 54 y.o. male admitted on 07/25/2019 with altered mental status. Patient previously on Acyclovir, Vancomycin, Ampicillin, and Ceftriaxone for meningitis. Antibiotics  for meningitis completed . However,  As of 5/7 plan per ID, to continue vancomycin and rocephin for 6 weeks total - last dose on 6/8 for discitis/osteomyelitis.    Today, 08/19/19  Day 22/42 Vanc/Rocephin  WBC and SCr stable per 5/17 labs, will recheck on 5/20  Vanc Trough 13 on 5/10 - would recheck in next few days   Vanc Trough 15 on 5/19 - therapeutic   Plan:  Continue vancomycin 1500mg  IV q12h  Rocephin 2g IV daily per Md  Height: 5\' 10"  (177.8 cm) Weight: 88.2 kg (194 lb 6.4 oz) IBW/kg (Calculated) : 73  Temp (24hrs), Avg:98.2 F (36.8 C), Min:98 F (36.7 C), Max:98.4 F (36.9 C)  Recent Labs  Lab 08/14/19 0353 08/16/19 1030 08/17/19 0345 08/17/19 0954  WBC 9.5  --   --   --   CREATININE 0.73  --  0.65 0.59*  VANCOTROUGH  --  15  --   --     Estimated Creatinine Clearance: 118.1 mL/min (A) (by C-G formula based on SCr of 0.59 mg/dL (L)).    No Known Allergies  Antimicrobials this admission: 4/28 Ceftriaxone x 1, 4/30 >> 4/28 Doxycycline >> 4/28 4/29 Ampicillin/sulbactam >> 4/30 4/30 Ampicillin>> 5/3 AM, resumed 5/3 PM >> 5/10 4/30 Acyclovir >> 5/7 4/30 Vancomycin >>    Microbiology results: 4/27 Influenza A/B: neg, Covid: neg 4/27 RPR: nonreactive 4/27 HIV: nonreactive 4/27 BCx: ngF 4/27 UCx: ngF  4/29 MRSA PCR: neg 5/2 CSF: gram stain neg,  ngF 5/2 CSF fungal: ngf 5/2 CSF HSV: ngf 5/2 CSF VDRL: ngf   Thank you for allowing pharmacy to be a part of this patient's care.     Royetta Asal, PharmD, BCPS 08/19/2019 11:44 AM

## 2019-08-20 LAB — GLUCOSE, CAPILLARY
Glucose-Capillary: 89 mg/dL (ref 70–99)
Glucose-Capillary: 87 mg/dL (ref 70–99)
Glucose-Capillary: 143 mg/dL — ABNORMAL HIGH (ref 70–99)
Glucose-Capillary: 118 mg/dL — ABNORMAL HIGH (ref 70–99)

## 2019-08-20 LAB — BASIC METABOLIC PANEL
Anion gap: 9 (ref 5–15)
BUN: 12 mg/dL (ref 6–20)
CO2: 24 mmol/L (ref 22–32)
Calcium: 8.8 mg/dL — ABNORMAL LOW (ref 8.9–10.3)
Chloride: 102 mmol/L (ref 98–111)
Creatinine, Ser: 0.76 mg/dL (ref 0.61–1.24)
GFR calc Af Amer: >60 mL/min (ref >60)
GFR calc non Af Amer: >60 mL/min (ref >60)
Glucose, Bld: 103 mg/dL — ABNORMAL HIGH (ref 70–99)
Potassium: 4.3 mmol/L (ref 3.5–5.1)
Sodium: 135 mmol/L (ref 135–145)

## 2019-08-20 LAB — CULTURE, FUNGUS WITHOUT SMEAR

## 2019-08-20 NOTE — Progress Notes (Addendum)
PROGRESS NOTE    [name]  VQQ:[id number] DOB: 1966/03/02 DOA: 07/25/2019 PCP: [name], MD    Brief Narrative:  Patient admitted to the hospital with a working diagnosis of toxic encephalopathy. He was found to have lumbar spine osteomyelitis/discitis. Currently on intravenous antibiotic, last dose 30/69.  54 year old male with a past medical history for schizophrenia, polysubstance abuse, asthma, prior CVA and GERD. Patient was brought to the hospital due to lethargy and confusion by his girlfriend. In the ED patient reported taking PCP, hearing, cocaine and alcohol the night prior to hospitalization. On his initial physical examination blood pressure 132/95, heart rate 116, respiratory rate 16, oxygen saturation 93%, he had dry mucous membranes, his lungs were clear to auscultation bilaterally, heart S1-S2, present rhythmic, abdomen was soft, he had mild lumbar tenderness, no lower extremity edema.  Patient was initially admitted to the intensive care unit where he received dexmedetomidine, along with close neurologic monitoring. Further work-up with CT of the lumbar spine showed osteomyelitis/discitis. Infectious disease recommended 6 weeks of intravenous antibiotic therapy with ceftriaxone and vancomycin.  Patient is not a candidate for outpatient antibiotic therapy due to his history of persistent fevers.  Pain controlled with analgesics and antiinflammatories   Assessment & Plan:   Principal Problem:   Acute osteomyelitis of lumbar spine (HCC) Active Problems:   Nicotine dependence, cigarettes, uncomplicated   Toxic metabolic encephalopathy   Polysubstance overdose   Acute kidney injury (HCC)   Chronic pain syndrome   SIRS (systemic inflammatory response syndrome) (HCC)   Delirium   Altered mental status   1.Acute lumbar spine osteomyelitis/discitis.Tolerating well antibiotic therapy with Ceftriaxone and Vancomycin IV.   Pain control with: 1.  Acetaminophen 1000 mg q 8 H 2. Gabapentin 300 mg bid 3. Ibuprofen 400 mg tid 4. Lidocaine patch 5. PRN oxycodone IR 5 to 10 mg every 4 H 6. Oxycodone 10 mg bid  Continue to encourage mobility.   2. Polysubstance abuse/ tobacco abuse/ schizophrenia.Continue with bupropion, benztropine, paliperidone and thiamine.   Continue smoking cessation.  3. Toxic encephalopathy.resolved, he isawake and alert. Contineu with thiamine and multivitamins.   4. HTN.Continue blood pressure control withdiltiazem and losartan.  5.T2DM (Hgb A1 c 6,6) Continue with insulin sliding scale for glucose cover and monitoring.   Status is: Inpatient  Remains inpatient appropriate because:IV treatments appropriate due to intensity of illness or inability to take PO   Dispo: The patient is from: Home              Anticipated d/c is to: Home              Anticipated d/c date is: > 3 days              Patient currently is not medically stable to d/c.    DVT prophylaxis: Enoxaparin   Code Status:   full  Family Communication:  No family at the bedside      Nutrition Status: Nutrition Problem: Increased nutrient needs Etiology: acute illness Signs/Symptoms: estimated needs Interventions: Hormel Shake, Magic cup, MVI    Antimicrobials:   Vancomycin and ceftriaxone     Subjective: Patient somnolent this am, no nausea or vomiting, no significant events over last 24, H.   Objective: Vitals:   08/19/19 0555 08/19/19 1321 08/19/19 2008 08/20/19 0549  BP: (!) 141/92 (!) 133/96 (!) 114/94 117/89  Pulse: 89 95 (!) 103 95  Resp: 16 17 18 16   Temp: 98 F (36.7 C) 97.7 F (36.5 C) 98.1  F (36.7 C) 98.6 F (37 C)  TempSrc: Oral Oral Oral Oral  SpO2: 98% 100% 98% 98%  Weight:      Height:        Intake/Output Summary (Last 24 hours) at 08/20/2019 1234 Last data filed at 08/20/2019 1100 Gross per 24 hour  Intake 2140 ml  Output 4500 ml  Net -2360 ml   Filed Weights   07/26/19 1858  08/10/19 1612  Weight: 94.3 kg 88.2 kg    Examination:   General: Not in pain or dyspnea  Neurology: somnolent but easy to arouse E ENT: no pallor, no icterus, oral mucosa moist Cardiovascular: No JVD. S1-S2 present, rhythmic, no gallops, rubs, or murmurs. No lower extremity edema. Pulmonary: positive breath sounds bilaterally, adequate air movement, no wheezing, rhonchi or rales. Gastrointestinal. Abdomen with, no organomegaly, non tender, no rebound or guarding Skin. No rashes Musculoskeletal: no joint deformities     Data Reviewed: I have personally reviewed following labs and imaging studies  CBC: Recent Labs  Lab 08/14/19 0353  WBC 9.5  HGB 9.1*  HCT 29.6*  MCV 80.0  PLT 423*   Basic Metabolic Panel: Recent Labs  Lab 08/14/19 0353 08/17/19 0345 08/17/19 0954 08/20/19 0420  NA 135  --  136 135  K 3.2*  --  3.9 4.3  CL 103  --  102 102  CO2 23  --  25 24  GLUCOSE 96  --  141* 103*  BUN 13  --  10 12  CREATININE 0.73 0.65 0.59* 0.76  CALCIUM 8.6*  --  8.9 8.8*   GFR: Estimated Creatinine Clearance: 118.1 mL/min (by C-G formula based on SCr of 0.76 mg/dL). Liver Function Tests: No results for input(s): AST, ALT, ALKPHOS, BILITOT, PROT, ALBUMIN in the last 168 hours. No results for input(s): LIPASE, AMYLASE in the last 168 hours. No results for input(s): AMMONIA in the last 168 hours. Coagulation Profile: No results for input(s): INR, PROTIME in the last 168 hours. Cardiac Enzymes: No results for input(s): CKTOTAL, CKMB, CKMBINDEX, TROPONINI in the last 168 hours. BNP (last 3 results) No results for input(s): PROBNP in the last 8760 hours. HbA1C: No results for input(s): HGBA1C in the last 72 hours. CBG: Recent Labs  Lab 08/19/19 1138 08/19/19 1642 08/19/19 2006 08/20/19 0722 08/20/19 1129  GLUCAP 123* 104* 114* 89 87   Lipid Profile: No results for input(s): CHOL, HDL, LDLCALC, TRIG, CHOLHDL, LDLDIRECT in the last 72 hours. Thyroid Function  Tests: No results for input(s): TSH, T4TOTAL, FREET4, T3FREE, THYROIDAB in the last 72 hours. Anemia Panel: No results for input(s): VITAMINB12, FOLATE, FERRITIN, TIBC, IRON, RETICCTPCT in the last 72 hours.    Radiology Studies: I have reviewed all of the imaging during this hospital visit personally     Scheduled Meds: . acetaminophen  1,000 mg Oral Q8H  . benztropine  1 mg Oral Daily  . buPROPion  300 mg Oral Daily  . Chlorhexidine Gluconate Cloth  6 each Topical Daily  . Chlorhexidine Gluconate Cloth  6 each Topical Daily  . diltiazem  240 mg Oral Daily  . enoxaparin (LOVENOX) injection  40 mg Subcutaneous Daily  . folic acid  1 mg Oral Daily  . gabapentin  300 mg Oral BID  . ibuprofen  400 mg Oral TID  . insulin aspart  0-5 Units Subcutaneous QHS  . insulin aspart  0-9 Units Subcutaneous TID WC  . lidocaine  1 patch Transdermal Q24H  . losartan  50 mg Oral  Daily  . multivitamin with minerals  1 tablet Oral Daily  . nicotine  21 mg Transdermal Daily  . oxyCODONE  10 mg Oral Q12H  . paliperidone  234 mg Intramuscular Q30 days  . pantoprazole  40 mg Oral Daily  . polyethylene glycol  17 g Oral BID  . thiamine  250 mg Oral Daily  . vitamin B-12  2,000 mcg Oral Daily   Continuous Infusions: . sodium chloride Stopped (07/28/19 1838)  . cefTRIAXone (ROCEPHIN)  IV 2 g (08/20/19 0910)  . vancomycin 1,500 mg (08/20/19 1110)     LOS: 25 days        Lailana Shira Annett Gula, MD

## 2019-08-21 LAB — GLUCOSE, CAPILLARY
Glucose-Capillary: 82 mg/dL (ref 70–99)
Glucose-Capillary: 122 mg/dL — ABNORMAL HIGH (ref 70–99)
Glucose-Capillary: 89 mg/dL (ref 70–99)
Glucose-Capillary: 108 mg/dL — ABNORMAL HIGH (ref 70–99)

## 2019-08-21 MED ORDER — BISACODYL 5 MG PO TBEC
10.0000 mg | DELAYED_RELEASE_TABLET | Freq: Once | ORAL | Status: AC
  Administered 2019-08-21: 10 mg via ORAL
  Filled 2019-08-21: qty 2

## 2019-08-21 NOTE — Progress Notes (Signed)
Pt [place] not acute changes. Pt resting in bed without distress.  MD updated. SRP, RN

## 2019-08-21 NOTE — Progress Notes (Signed)
PROGRESS NOTE    [name]  ZOX:[id number] DOB: 1965/06/13 DOA: 07/25/2019 PCP: [name], MD    Brief Narrative:  Patient admitted to the hospital with a working diagnosis of toxic encephalopathy. He was found to have lumbar spine osteomyelitis/discitis. Currently on intravenous antibiotic, last dose 46/60.  54 year old male with a past medical history for schizophrenia, polysubstance abuse, asthma, prior CVA and GERD. Patient was brought to the hospital due to lethargy and confusion by his girlfriend. In the ED patient reported taking PCP, hearing, cocaine and alcohol the night prior to hospitalization. On his initial physical examination blood pressure 132/95, heart rate 116, respiratory rate 16, oxygen saturation 93%, he had dry mucous membranes, his lungs were clear to auscultation bilaterally, heart S1-S2, present rhythmic, abdomen was soft, he had mild lumbar tenderness, no lower extremity edema.  Patient was initially admitted to the intensive care unit where he received dexmedetomidine, along with close neurologic monitoring. Further work-up with CT of the lumbar spine showed osteomyelitis/discitis. Infectious disease recommended 6 weeks of intravenous antibiotic therapy with ceftriaxone and vancomycin.  Patient is not a candidate for outpatient antibiotic therapy due to his history of persistent fevers.  Pain controlled with analgesics and antiinflammatories   Assessment & Plan:   Principal Problem:   Acute osteomyelitis of lumbar spine (HCC) Active Problems:   Nicotine dependence, cigarettes, uncomplicated   Toxic metabolic encephalopathy   Polysubstance overdose   Acute kidney injury (HCC)   Chronic pain syndrome   SIRS (systemic inflammatory response syndrome) (HCC)   Delirium   Altered mental status   1.Acute lumbar spine osteomyelitis/discitis.Continue antibiotic therapy with Ceftriaxone and Vancomycin IV.   Pain control with: 1.  Acetaminophen 1000 mg q 8 H 2. Gabapentin 300 mg bid 3. Ibuprofen 400 mg tid 4. Lidocaine patch 5. PRN oxycodone IR 5 to 10 mg every 4 H 6. Oxycodone 10 mg bid  Will continue same pain regimen, today he complains of constipation, no bowel movement in 3 days. Will add dulcolax 10 mg today.   2. Polysubstance abuse/ tobacco abuse/ schizophrenia.On bupropion, benztropine, paliperidone and thiamine.   Continue smoking cessation counseling.  3. Toxic encephalopathy.resolved, he isawake and alert.On thiamine and multivitamins. Working with physical and occupational therapy. Continue to encourage out of bed to chair tid with meals.   4. HTN.On diltiazem and losartan for blood pressure control.   5.T2DM(Hgb A1 c 6,6)On insulin sliding scale for glucose cover and monitoring. capillary glucose 118 and 82.    Status is: Inpatient  Remains inpatient appropriate because:IV treatments appropriate due to intensity of illness or inability to take PO   Dispo: The patient is from: Home              Anticipated d/c is to: Home              Anticipated d/c date is: > 3 days              Patient currently is not medically stable to d/c.    DVT prophylaxis: Enoxaparin   Code Status:   full  Family Communication:  No family at the bedside      Nutrition Status: Nutrition Problem: Increased nutrient needs Etiology: acute illness Signs/Symptoms: estimated needs Interventions: Hormel Shake, Magic cup, MVI       Subjective: Patient continue to have pain at the lower back, worse while in bed. Positive constipation, no bowel movement in 3 days, no nausea or vomiting.   Objective: Vitals:   08/20/19  0549 08/20/19 1234 08/20/19 2143 08/21/19 0500  BP: 117/89 127/78 (!) 130/93 (!) 117/91  Pulse: 95 86 98 96  Resp: 16 17 17 16   Temp: 98.6 F (37 C) 98.4 F (36.9 C) 98.9 F (37.2 C) 98 F (36.7 C)  TempSrc: Oral Oral    SpO2: 98% 98% 98% 95%  Weight:      Height:         Intake/Output Summary (Last 24 hours) at 08/21/2019 0936 Last data filed at 08/21/2019 0200 Gross per 24 hour  Intake 1620 ml  Output 1000 ml  Net 620 ml   Filed Weights   07/26/19 1858 08/10/19 1612  Weight: 94.3 kg 88.2 kg    Examination:   General: Not in pain or dyspnea, deconditioned  Neurology: Awake and alert, non focal  E ENT: no pallor, no icterus, oral mucosa moist Cardiovascular: No JVD. S1-S2 present, rhythmic, no gallops, rubs, or murmurs. No lower extremity edema. Pulmonary: positive breath sounds bilaterally, adequate air movement, no wheezing, rhonchi or rales. Gastrointestinal. Abdomen soft with, no organomegaly, non tender, no rebound or guarding Skin. No rashes Musculoskeletal: no joint deformities     Data Reviewed: I have personally reviewed following labs and imaging studies  CBC: No results for input(s): WBC, NEUTROABS, HGB, HCT, MCV, PLT in the last 168 hours. Basic Metabolic Panel: Recent Labs  Lab 08/17/19 0345 08/17/19 0954 08/20/19 0420  NA  --  136 135  K  --  3.9 4.3  CL  --  102 102  CO2  --  25 24  GLUCOSE  --  141* 103*  BUN  --  10 12  CREATININE 0.65 0.59* 0.76  CALCIUM  --  8.9 8.8*   GFR: Estimated Creatinine Clearance: 118.1 mL/min (by C-G formula based on SCr of 0.76 mg/dL). Liver Function Tests: No results for input(s): AST, ALT, ALKPHOS, BILITOT, PROT, ALBUMIN in the last 168 hours. No results for input(s): LIPASE, AMYLASE in the last 168 hours. No results for input(s): AMMONIA in the last 168 hours. Coagulation Profile: No results for input(s): INR, PROTIME in the last 168 hours. Cardiac Enzymes: No results for input(s): CKTOTAL, CKMB, CKMBINDEX, TROPONINI in the last 168 hours. BNP (last 3 results) No results for input(s): PROBNP in the last 8760 hours. HbA1C: No results for input(s): HGBA1C in the last 72 hours. CBG: Recent Labs  Lab 08/20/19 0722 08/20/19 1129 08/20/19 1651 08/20/19 2146 08/21/19 0718   GLUCAP 89 87 143* 118* 82   Lipid Profile: No results for input(s): CHOL, HDL, LDLCALC, TRIG, CHOLHDL, LDLDIRECT in the last 72 hours. Thyroid Function Tests: No results for input(s): TSH, T4TOTAL, FREET4, T3FREE, THYROIDAB in the last 72 hours. Anemia Panel: No results for input(s): VITAMINB12, FOLATE, FERRITIN, TIBC, IRON, RETICCTPCT in the last 72 hours.    Radiology Studies: I have reviewed all of the imaging during this hospital visit personally     Scheduled Meds: . acetaminophen  1,000 mg Oral Q8H  . benztropine  1 mg Oral Daily  . buPROPion  300 mg Oral Daily  . Chlorhexidine Gluconate Cloth  6 each Topical Daily  . Chlorhexidine Gluconate Cloth  6 each Topical Daily  . diltiazem  240 mg Oral Daily  . enoxaparin (LOVENOX) injection  40 mg Subcutaneous Daily  . folic acid  1 mg Oral Daily  . gabapentin  300 mg Oral BID  . ibuprofen  400 mg Oral TID  . insulin aspart  0-5 Units Subcutaneous QHS  .  insulin aspart  0-9 Units Subcutaneous TID WC  . lidocaine  1 patch Transdermal Q24H  . losartan  50 mg Oral Daily  . multivitamin with minerals  1 tablet Oral Daily  . nicotine  21 mg Transdermal Daily  . oxyCODONE  10 mg Oral Q12H  . paliperidone  234 mg Intramuscular Q30 days  . pantoprazole  40 mg Oral Daily  . polyethylene glycol  17 g Oral BID  . thiamine  250 mg Oral Daily  . vitamin B-12  2,000 mcg Oral Daily   Continuous Infusions: . sodium chloride Stopped (07/28/19 1838)  . cefTRIAXone (ROCEPHIN)  IV 2 g (08/20/19 0910)  . vancomycin 1,500 mg (08/20/19 2253)     LOS: 26 days        Koraima Albertsen Annett Gula, MD

## 2019-08-21 NOTE — Progress Notes (Signed)
Nutrition Follow-up  DOCUMENTATION CODES:   Not applicable  INTERVENTION:  - continue Hormel Shake once/day and Magic Cup BID. - weigh patient today.   NUTRITION DIAGNOSIS:   Increased nutrient needs related to acute illness as evidenced by estimated needs. -ongoing  GOAL:   Patient will meet greater than or equal to 90% of their needs -met on average  MONITOR:   PO intake, Supplement acceptance, Labs, Weight trends  ASSESSMENT:   54 year old male with medical history of HTN, prior CVA, GERD, depression/anxiety, chronic pain syndrome, alcohol abuse, and polysubstance abuse including PCP, heroin, and cocaine. He presented to the ED with tachycardia and tachypnea. He was noted to be encephalopathic and required ICU admission for precedex. There was concern for bacterial meningitis and ID recommended 14 days of abx. CT of L-spine showed osteomyelitis/discitis. ID recommended 6 weeks IV abx (to end 6/8) and patient is not a candidate for home PICC due to hx of substance abuse/use.   Patient has been eating mainly 100% of meals over the past 5 days. Patient has not been weighed since 5/13.  Per notes: - acute lumbar spine osteomyelitis/discitis--to remain intubation d/t need for IV abx - bowel regimen started d/t patient reporting constipation - hx of polysubstance abuse    Labs reviewed; CBGs: 82, 122, 89 mg/dl. Medications reviewed; 1 mg folvite/day, sliding scale novolog, 40 mg oral protonix/day, 17 g miralax BID, 250 mg thiamine/day, 2000 mcg oral cyanocobalamin/day.    Diet Order:   Diet Order            Diet regular Room service appropriate? Yes; Fluid consistency: Thin  Diet effective now              EDUCATION NEEDS:   Not appropriate for education at this time  Skin:  Skin Assessment: Reviewed RN Assessment  Last BM:  5/22  Height:   Ht Readings from Last 1 Encounters:  07/26/19 '5\' 10"'$  (1.778 m)    Weight:   Wt Readings from Last 1 Encounters:   08/10/19 88.2 kg    Estimated Nutritional Needs:  Kcal:  2200-2400 kcal Protein:  110-125 grams Fluid:  >/= 2.2 L/day     Jarome Matin, MS, RD, LDN, CNSC Inpatient Clinical Dietitian RD pager # available in [facility]  After hours/weekend pager # available in [facility]

## 2019-08-21 NOTE — Plan of Care (Signed)
  Problem: Education: Goal: Knowledge of General Education information will improve Description: Including pain rating scale, medication(s)/side effects and non-pharmacologic comfort measures Outcome: Progressing   Problem: Clinical Measurements: Goal: Will remain free from infection Outcome: Progressing   

## 2019-08-21 NOTE — TOC Progression Note (Signed)
Transition of Care [facility]) - Progression Note    Patient Details  Name: [name] MRN: [id number] Date of Birth: 11/19/1965  Transition of [facility]) CM/SW Contact  Joaquin Courts, RN Phone Number: 08/21/2019, 11:42 AM  Clinical Narrative:  CM spoke with MD this morning, plan remains for patient to complete IV antibiotics in hospital, with a completion date of 09/05/19 after which he can dc back home.  No alternative discharge dispositions are feasible for this patient at this time.        Expected Discharge Plan: [place] Barriers to Discharge: Continued Medical Work up  Expected Discharge Plan and Services Expected Discharge Plan: [place]   Discharge Planning Services: CM Consult Post Acute Care Choice: [place] arrangements for the past 2 months: Apartment                                       Social Determinants of Health (SDOH) Interventions    Readmission Risk Interventions No flowsheet data found.

## 2019-08-21 NOTE — Progress Notes (Signed)
Physical Therapy Treatment Patient Details Name: [name] MRN: [id number] DOB: 1965/06/24 Today's Date: 08/21/2019    History of Present Illness 54 yo male admitted with toxic metabolic encephalopathy, bacterial meningitis, unwitness fall 5/2. Hx of recent lumbar sg L3-L5 03/2019, polysubstance abuse, schizophrenia, ETOH abuse, CVA, bipolar d/o, back pain, L ankle fusion 2019, R TKA 2018, asthma.    PT Comments    Pt in severe back pain upon PT arrival to room, but motivated to participate in gait training. Pt very limited by high amounts of pain during gait, but tolerated short hallway distance with heavy reliance on RW and frequent cuing from PT for form and safety. RN notified of pt's severe pain and limited tolerance for PT this day, will continue to follow acutely.   Follow Up Recommendations  Other (comment)(per chart review pt plans to stay in hospital until completion of his IV Antibiotics approx June 8th)     Equipment Recommendations  None recommended by PT    Recommendations for Other Services       Precautions / Restrictions Precautions Precautions: Fall Precaution Comments: "bad" L hip Required Braces or Orthoses: Spinal Brace Spinal Brace: Thoracolumbosacral orthotic;Applied in sitting position Restrictions Weight Bearing Restrictions: No    Mobility  Bed Mobility Overal bed mobility: Needs Assistance Bed Mobility: Sidelying to Sit;Rolling;Sit to Supine Rolling: Supervision Sidelying to sit: HOB elevated;Supervision   Sit to supine: Min assist   General bed mobility comments: supervision for coming to EOB for increased time and use of bedrails, VC for sequencing. Min assist for return to supine for LE lifting into bed.  Transfers Overall transfer level: Needs assistance Equipment used: Rolling walker (2 wheeled) Transfers: Sit to/from Stand Sit to Stand: Min guard         General transfer comment: for safety, verbal cuing for hand placement when  rising and keeping chest upright as opposed to heavy forward flexion of lumbar spine.  Ambulation/Gait Ambulation/Gait assistance: Min guard Gait Distance (Feet): 60 Feet Assistive device: Rolling walker (2 wheeled) Gait Pattern/deviations: Step-through pattern;Decreased stride length;Trunk flexed Gait velocity: decr   General Gait Details: Min guard for safety, verbal cuing for sequencing with step through gait but shorter steps to limit pt back and LE discomfort, upright posture, and placemetn in RW. x2 standing rest breaks due to severe pain.   Stairs             Wheelchair Mobility    Modified Rankin (Stroke Patients Only)       Balance Overall balance assessment: Needs assistance;History of Falls Sitting-balance support: No upper extremity supported;Feet supported Sitting balance-[name]: Good     Standing balance support: Bilateral upper extremity supported;During functional activity Standing balance-[name]: Poor Standing balance comment: reliant on external support in standing                            Cognition Arousal/Alertness: Awake/alert Behavior During Therapy: WFL for tasks assessed/performed Overall Cognitive Status: No family/caregiver present to determine baseline cognitive functioning                                 General Comments: impulsive, requires cuing for safety throughout mobility. For example, pt without warning through himself back into bed once sitting EOB very unsafely nearly hitting head on bedrail. Distractible during session, especially by television.      Exercises  General Comments        Pertinent Vitals/Pain Pain Assessment: 0-10 Pain Score: 10-Worst pain ever Pain Location: back, scooting down my legs Pain Descriptors / Indicators: Grimacing;Discomfort;Sharp;Shooting Pain Intervention(s): Limited activity within patient's tolerance;Monitored during session;Repositioned;Patient  requesting pain meds-RN notified    Home Living                      Prior Function            PT Goals (current goals can now be found in the care plan section) Acute Rehab PT Goals Patient Stated Goal: to get better PT Goal Formulation: With patient Time For Goal Achievement: 09/04/19 Potential to Achieve Goals: Good Progress towards PT goals: Progressing toward goals    Frequency    Min 2X/week      PT Plan Current plan remains appropriate    Co-evaluation              AM-PAC PT "6 Clicks" Mobility   Outcome Measure  Help needed turning from your back to your side while in a flat bed without using bedrails?: None Help needed moving from lying on your back to sitting on the side of a flat bed without using bedrails?: A Little Help needed moving to and from a bed to a chair (including a wheelchair)?: A Little Help needed standing up from a chair using your arms (e.g., wheelchair or bedside chair)?: A Little Help needed to walk in hospital room?: A Little Help needed climbing 3-5 steps with a railing? : A Little 6 Click Score: 19    End of Session Equipment Utilized During Treatment: Gait belt Activity Tolerance: Patient tolerated treatment well Patient left: with call bell/phone within reach;in bed;with bed alarm set;with nursing/sitter in room Nurse Communication: Mobility status;Patient requests pain meds PT Visit Diagnosis: History of falling (Z91.81);Difficulty in walking, not elsewhere classified (R26.2);Pain;Muscle weakness (generalized) (M62.81)     Time: 1001-1015 PT Time Calculation (min) (ACUTE ONLY): 14 min  Charges:  $Gait Training: 8-22 mins                     Donielle Radziewicz E, PT Acute Rehabilitation Services Pager [phone]  Office [phone]   Tyrone Apple D Despina Hidden 08/21/2019, 2:28 PM

## 2019-08-22 LAB — GLUCOSE, CAPILLARY
Glucose-Capillary: 91 mg/dL (ref 70–99)
Glucose-Capillary: 100 mg/dL — ABNORMAL HIGH (ref 70–99)
Glucose-Capillary: 140 mg/dL — ABNORMAL HIGH (ref 70–99)
Glucose-Capillary: 107 mg/dL — ABNORMAL HIGH (ref 70–99)

## 2019-08-22 LAB — CREATININE, SERUM
Creatinine, Ser: 0.73 mg/dL (ref 0.61–1.24)
GFR calc Af Amer: >60 mL/min (ref >60)
GFR calc non Af Amer: >60 mL/min (ref >60)

## 2019-08-22 MED ORDER — CYCLOBENZAPRINE HCL 5 MG PO TABS
5.0000 mg | ORAL_TABLET | Freq: Three times a day (TID) | ORAL | Status: DC
  Administered 2019-08-22 – 2019-08-30 (×23): 5 mg via ORAL
  Filled 2019-08-22 (×23): qty 1

## 2019-08-22 NOTE — Progress Notes (Signed)
PROGRESS NOTE    [name]  [id number] DOB: 09-27-1965 DOA: 07/25/2019 PCP: [name], MD    Brief Narrative:  Patient admitted to the hospital with a working diagnosis of toxic encephalopathy. He was found to have lumbar spine osteomyelitis/discitis. Currently on intravenous antibiotic, last dose 2/68.  54 year old male with a past medical history for schizophrenia, polysubstance abuse, asthma, prior CVA and GERD. Patient was brought to the hospital due to lethargy and confusion by his girlfriend. In the ED patient reported taking PCP, hearing, cocaine and alcohol the night prior to hospitalization. On his initial physical examination blood pressure 132/95, heart rate 116, respiratory rate 16, oxygen saturation 93%, he had dry mucous membranes, his lungs were clear to auscultation bilaterally, heart S1-S2, present rhythmic, abdomen was soft, he had mild lumbar tenderness, no lower extremity edema.  Patient was initially admitted to the intensive care unit where he received dexmedetomidine, along with close neurologic monitoring. Further work-up with CT of the lumbar spine showed osteomyelitis/discitis. Infectious disease recommended 6 weeks of intravenous antibiotic therapy with ceftriaxone and vancomycin.  Patient is not a candidate for outpatient antibiotic therapy due to his history of persistent fevers.  Pain controlled with analgesics, muscle relaxants and antiinflammatories   Assessment & Plan:   Principal Problem:   Acute osteomyelitis of lumbar spine (HCC) Active Problems:   Nicotine dependence, cigarettes, uncomplicated   Toxic metabolic encephalopathy   Polysubstance overdose   Acute kidney injury (HCC)   Chronic pain syndrome   SIRS (systemic inflammatory response syndrome) (HCC)   Delirium   Altered mental status  1.Acute lumbar spine osteomyelitis/discitis.Tolerating well antibiotic therapy with Ceftriaxone and Vancomycin IV.   Continue  pain control with:  1. Acetaminophen 1000 mg q 8 H 2. Gabapentin 300 mg bid 3. Ibuprofen 400 mg tid 4. Lidocaine patch 5. PRN oxycodone IR 5 to 10 mg every 4 H 6. Oxycodone 10 mg bid 7. Will add tid Flexeril for muscle spasms.   Positive bowel movement, continue with bowel regimen, encourage out of bed to chair tid with meals.   2. Polysubstance abuse/ tobacco abuse/ schizophrenia.Continue withbupropion, benztropine, paliperidone and thiamine.   Smoking cessation counseling.  3. Toxic encephalopathy.resolved, he isawake and alert.Continue withthiamine and multivitamins.  4. HTN.Continue with diltiazem and losartan for blood pressure control.   5.T2DM(Hgb A1 c 6,6)Continue with insulin sliding scale for glucose cover and monitoring.capillary glucose 118 and 82.     Status is: Inpatient  Remains inpatient appropriate because:IV treatments appropriate due to intensity of illness or inability to take PO and Inpatient level of care appropriate due to severity of illness   Dispo: The patient is from: Home              Anticipated d/c is to: Home              Anticipated d/c date is: > 3 days              Patient currently is not medically stable to d/c.    DVT prophylaxis:  enoxaparin  Code Status:    full  Family Communication: no family at the bedside.       Nutrition Status: Nutrition Problem: Increased nutrient needs Etiology: acute illness Signs/Symptoms: estimated needs Interventions: Hormel Shake, Magic cup, MVI     Subjective: Patient continue to have back pain, worse with movement, no nausea or vomiting and tolearating po well.   Objective: Vitals:   08/21/19 0500 08/21/19 1416 08/21/19 2002 08/22/19 9562  BP: (!) 117/91 100/68 (!) 108/93 114/87  Pulse: 96 (!) 108 92 95  Resp: 16 16 17 16   Temp: 98 F (36.7 C) 98.2 F (36.8 C) 98.2 F (36.8 C) 98 F (36.7 C)  TempSrc:  Oral Oral Oral  SpO2: 95% 100% 100% 99%  Weight:       Height:       No intake or output data in the 24 hours ending 08/22/19 1253 Filed Weights   07/26/19 1858 08/10/19 1612  Weight: 94.3 kg 88.2 kg    Examination:   General: Not in pain or dyspnea  Neurology: Awake and alert, non focal  E ENT: no pallor, no icterus, oral mucosa moist Cardiovascular: No JVD. S1-S2 present, rhythmic, no gallops, rubs, or murmurs. No lower extremity edema. Pulmonary: positive breath sounds bilaterally, adequate air movement, no wheezing, rhonchi or rales. Gastrointestinal. Abdomen with no organomegaly, non tender, no rebound or guarding Skin. No rashes Musculoskeletal: no joint deformities     Data Reviewed: I have personally reviewed following labs and imaging studies  CBC: No results for input(s): WBC, NEUTROABS, HGB, HCT, MCV, PLT in the last 168 hours. Basic Metabolic Panel: Recent Labs  Lab 08/17/19 0345 08/17/19 0954 08/20/19 0420 08/22/19 0440  NA  --  136 135  --   K  --  3.9 4.3  --   CL  --  102 102  --   CO2  --  25 24  --   GLUCOSE  --  141* 103*  --   BUN  --  10 12  --   CREATININE 0.65 0.59* 0.76 0.73  CALCIUM  --  8.9 8.8*  --    GFR: Estimated Creatinine Clearance: 118.1 mL/min (by C-G formula based on SCr of 0.73 mg/dL). Liver Function Tests: No results for input(s): AST, ALT, ALKPHOS, BILITOT, PROT, ALBUMIN in the last 168 hours. No results for input(s): LIPASE, AMYLASE in the last 168 hours. No results for input(s): AMMONIA in the last 168 hours. Coagulation Profile: No results for input(s): INR, PROTIME in the last 168 hours. Cardiac Enzymes: No results for input(s): CKTOTAL, CKMB, CKMBINDEX, TROPONINI in the last 168 hours. BNP (last 3 results) No results for input(s): PROBNP in the last 8760 hours. HbA1C: No results for input(s): HGBA1C in the last 72 hours. CBG: Recent Labs  Lab 08/21/19 1110 08/21/19 1635 08/21/19 2114 08/22/19 0720 08/22/19 1119  GLUCAP 122* 89 108* 91 100*   Lipid Profile: No  results for input(s): CHOL, HDL, LDLCALC, TRIG, CHOLHDL, LDLDIRECT in the last 72 hours. Thyroid Function Tests: No results for input(s): TSH, T4TOTAL, FREET4, T3FREE, THYROIDAB in the last 72 hours. Anemia Panel: No results for input(s): VITAMINB12, FOLATE, FERRITIN, TIBC, IRON, RETICCTPCT in the last 72 hours.    Radiology Studies: I have reviewed all of the imaging during this hospital visit personally     Scheduled Meds: . acetaminophen  1,000 mg Oral Q8H  . benztropine  1 mg Oral Daily  . buPROPion  300 mg Oral Daily  . Chlorhexidine Gluconate Cloth  6 each Topical Daily  . Chlorhexidine Gluconate Cloth  6 each Topical Daily  . diltiazem  240 mg Oral Daily  . enoxaparin (LOVENOX) injection  40 mg Subcutaneous Daily  . folic acid  1 mg Oral Daily  . gabapentin  300 mg Oral BID  . ibuprofen  400 mg Oral TID  . insulin aspart  0-5 Units Subcutaneous QHS  . insulin aspart  0-9 Units Subcutaneous  TID WC  . lidocaine  1 patch Transdermal Q24H  . losartan  50 mg Oral Daily  . multivitamin with minerals  1 tablet Oral Daily  . nicotine  21 mg Transdermal Daily  . oxyCODONE  10 mg Oral Q12H  . paliperidone  234 mg Intramuscular Q30 days  . pantoprazole  40 mg Oral Daily  . polyethylene glycol  17 g Oral BID  . thiamine  250 mg Oral Daily  . vitamin B-12  2,000 mcg Oral Daily   Continuous Infusions: . sodium chloride Stopped (07/28/19 1838)  . cefTRIAXone (ROCEPHIN)  IV 2 g (08/22/19 1248)  . vancomycin 1,500 mg (08/22/19 0949)     LOS: 27 days        Hridhaan Yohn Annett Gula, MD

## 2019-08-22 NOTE — Progress Notes (Signed)
Occupational Therapy Treatment Patient Details Name: [name] MRN: [id number] DOB: Jul 20, 1965 Today's Date: 08/22/2019    History of present illness 54 yo male admitted with toxic metabolic encephalopathy, bacterial meningitis, unwitness fall 5/2. Hx of recent lumbar sg L3-L5 03/2019, polysubstance abuse, schizophrenia, ETOH abuse, CVA, bipolar d/o, back pain, L ankle fusion 2019, R TKA 2018, asthma.   OT comments  Pt very agreeable to OT  Follow Up Recommendations  No OT follow up;Other (comment)    Equipment Recommendations  None recommended by OT    Recommendations for Other Services      Precautions / Restrictions Precautions Precautions: Fall Precaution Comments: "bad" L hip Required Braces or Orthoses: Spinal Brace Spinal Brace: Thoracolumbosacral orthotic;Applied in sitting position Restrictions Weight Bearing Restrictions: No       Mobility Bed Mobility               General bed mobility comments: pt OOB  Transfers Overall transfer level: Needs assistance Equipment used: Rolling walker (2 wheeled) Transfers: Sit to/from [employer] Transfers   Stand pivot transfers: Min guard                ADL either performed or assessed with clinical judgement   ADL Overall ADL's : Needs assistance/impaired Eating/Feeding: Independent   Grooming: Brushing hair;Set up;Wash/dry face;Standing;Cueing for safety                   Toilet Transfer: Minimal assistance;Stand-pivot;Cueing for safety;Cueing for sequencing   Toileting- Clothing Manipulation and Hygiene: Min guard;Sit to/from stand       Functional mobility during ADLs: Rolling walker;Min guard       Vision Baseline Vision/History: No visual deficits            Cognition Arousal/Alertness: Awake/alert Behavior During Therapy: WFL for tasks assessed/performed Overall Cognitive Status: Within Functional Limits for tasks assessed                                  General Comments: cooperative and always willing.                   Pertinent Vitals/ Pain       Pain Score: 2  Pain Location: back Pain Descriptors / Indicators: Dull Pain Intervention(s): Limited activity within patient's tolerance;Repositioned      Progress Toward Goals  OT Goals(current goals can now be found in the care plan section)  Progress towards OT goals: Progressing toward goals     Plan Discharge plan remains appropriate       AM-PAC OT "6 Clicks" Daily Activity     Outcome Measure   Help from another person eating meals?: None Help from another person taking care of personal grooming?: A Little Help from another person toileting, which includes using toliet, bedpan, or urinal?: A Little Help from another person bathing (including washing, rinsing, drying)?: A Little Help from another person to put on and taking off regular upper body clothing?: A Little Help from another person to put on and taking off regular lower body clothing?: A Little 6 Click Score: 19    End of Session Equipment Utilized During Treatment: Rolling walker;Back brace  OT Visit Diagnosis: Unsteadiness on feet (R26.81);Pain   Activity Tolerance Patient tolerated treatment well   Patient Left with call bell/phone within reach;in chair;with chair alarm set   Nurse Communication Mobility status        Time: [phone]  OT Time Calculation (min): 18 min  Charges: OT General Charges $OT Visit: 1 Visit OT Treatments $Self Care/Home Management : 8-22 mins  Lise Auer, OT Acute Rehabilitation Services [phone] Office- [phone]      Illana Nolting, Karin Golden D 08/22/2019, 5:42 PM

## 2019-08-22 NOTE — Plan of Care (Signed)
  Problem: Education: Goal: Knowledge of General Education information will improve Description Including pain rating scale, medication(s)/side effects and non-pharmacologic comfort measures Outcome: Progressing   

## 2019-08-23 LAB — CBC WITH DIFFERENTIAL/PLATELET
Abs Immature Granulocytes: 0.03 10*3/uL (ref 0.00–0.07)
Basophils Absolute: 0 10*3/uL (ref 0.0–0.1)
Basophils Relative: 0 %
Eosinophils Absolute: 0.4 10*3/uL (ref 0.0–0.5)
Eosinophils Relative: 5 %
HCT: 34.7 % — ABNORMAL LOW (ref 39.0–52.0)
Hemoglobin: 10.4 g/dL — ABNORMAL LOW (ref 13.0–17.0)
Immature Granulocytes: 0 %
Lymphocytes Relative: 12 %
Lymphs Abs: 1 10*3/uL (ref 0.7–4.0)
MCH: 25.1 pg — ABNORMAL LOW (ref 26.0–34.0)
MCHC: 30 g/dL (ref 30.0–36.0)
MCV: 83.6 fL (ref 80.0–100.0)
Monocytes Absolute: 0.5 10*3/uL (ref 0.1–1.0)
Monocytes Relative: 6 %
Neutro Abs: 6.5 10*3/uL (ref 1.7–7.7)
Neutrophils Relative %: 77 %
Platelets: 379 10*3/uL (ref 150–400)
RBC: 4.15 MIL/uL — ABNORMAL LOW (ref 4.22–5.81)
RDW: 20.1 % — ABNORMAL HIGH (ref 11.5–15.5)
WBC: 8.4 10*3/uL (ref 4.0–10.5)
nRBC: 0 % (ref 0.0–0.2)

## 2019-08-23 LAB — VANCOMYCIN, TROUGH: Vancomycin Tr: 16 ug/mL (ref 15–20)

## 2019-08-23 LAB — CREATININE, SERUM
Creatinine, Ser: 0.71 mg/dL (ref 0.61–1.24)
GFR calc Af Amer: >60 mL/min (ref >60)
GFR calc non Af Amer: >60 mL/min (ref >60)

## 2019-08-23 LAB — GLUCOSE, CAPILLARY
Glucose-Capillary: 86 mg/dL (ref 70–99)
Glucose-Capillary: 93 mg/dL (ref 70–99)
Glucose-Capillary: 98 mg/dL (ref 70–99)
Glucose-Capillary: 122 mg/dL — ABNORMAL HIGH (ref 70–99)

## 2019-08-23 LAB — BASIC METABOLIC PANEL
Anion gap: 10 (ref 5–15)
BUN: 13 mg/dL (ref 6–20)
CO2: 25 mmol/L (ref 22–32)
Calcium: 8.9 mg/dL (ref 8.9–10.3)
Chloride: 101 mmol/L (ref 98–111)
Creatinine, Ser: 0.63 mg/dL (ref 0.61–1.24)
GFR calc Af Amer: >60 mL/min (ref >60)
GFR calc non Af Amer: >60 mL/min (ref >60)
Glucose, Bld: 109 mg/dL — ABNORMAL HIGH (ref 70–99)
Potassium: 4.5 mmol/L (ref 3.5–5.1)
Sodium: 136 mmol/L (ref 135–145)

## 2019-08-23 NOTE — Progress Notes (Signed)
Pharmacy Antibiotic Note  [name] is a 54 y.o. male admitted on 07/25/2019 with altered mental status. Patient previously on Acyclovir, Vancomycin, Ampicillin, and Ceftriaxone for meningitis. Antibiotics  for meningitis completed . However,  As of 5/7 plan per ID, to continue vancomycin and rocephin for 6 weeks total - last dose on 6/8 for discitis/osteomyelitis.    Today, 08/23/19  Day 28/42 Vanc/Rocephin  SCr stable 0.71  Vanc Trough 16 mcg/ml which is therapeutic on 1500mg  q12h  Plan:  Continue vancomycin 1500mg  IV q12h  Rocephin 2g IV daily per Md  Plan to recheck trough in 1 week  Height: 5\' 10"  (177.8 cm) Weight: 88.2 kg (194 lb 6.4 oz) IBW/kg (Calculated) : 73  Temp (24hrs), Avg:98.5 F (36.9 C), Min:98.1 F (36.7 C), Max:99.3 F (37.4 C)  Recent Labs  Lab 08/17/19 0345 08/17/19 0954 08/20/19 0420 08/22/19 0440 08/23/19 0944  CREATININE 0.65 0.59* 0.76 0.73 0.71  VANCOTROUGH  --   --   --   --  16    Estimated Creatinine Clearance: 118.1 mL/min (by C-G formula based on SCr of 0.71 mg/dL).    No Known Allergies  /28 Ceftriaxone x 1, 4/30 >> 4/28 Doxycycline >> 4/28 4/29 Ampicillin/sulbactam >> 4/30 4/30 Ampicillin>> 5/3 AM, resumed 5/3 PM >> 5/10 4/30 Acyclovir >> 5/7 4/30 Vancomycin >>   Dose adjustments/levels this admission: 5/4 VT = 16 on 1500 q12 5/10 VT = 13, continue 5/19 VT = 15, continue  5/26 VT = 16, continue  Microbiology results: 4/27 Influenza A/B: neg, Covid: neg 4/27 RPR: nonreactive 4/27 HIV: nonreactive 4/27 BCx: ngF 4/27 UCx: ngF  4/29 MRSA PCR: neg 5/2 CSF: gram stain neg,  ngF 5/2 CSF fungal: No Fungus Isolated After 21 days  5/2 CSF HSV: Neg 5/2 CSF VDRL: neg  Thank you for allowing pharmacy to be a part of this patient's care.  Peggyann Juba, PharmD, BCPS Pharmacy: [phone] 08/23/2019 11:22 AM

## 2019-08-23 NOTE — Plan of Care (Signed)
  Problem: Education: Goal: Knowledge of General Education information will improve Description Including pain rating scale, medication(s)/side effects and non-pharmacologic comfort measures Outcome: Progressing   Problem: Health Behavior/Discharge Planning: Goal: Ability to manage health-related needs will improve Outcome: Progressing   

## 2019-08-23 NOTE — Progress Notes (Signed)
PROGRESS NOTE  [name]  QIO:[id number] DOB: Sep 14, 1965 DOA: 07/25/2019 PCP: [name], MD   Brief Narrative: [name] is a 54 y.o. male with a history of lumbar laminectomy/fusion, polysubstance abuse, schizophrenia, CVA, asthma, and GERD who was brought to the ED 4/27 by his girlfriend due to lethargy. He was tachycardic and appeared dehydrated, reporting taking many substances the prior evening. He was initially admitted to the ICU for toxic encephalopathy, possible EtOH withdrawal on precedex and empirically treated for meningitis with resolution of fever.Further work-up with CT of the lumbar spine showed osteomyelitis/discitis with epidural phlegmon not amenable to drainage.Infectious disease recommended 6 weeks of intravenous antibiotic therapy with ceftriaxone and vancomycin (final day 6/8).  Patient is not a candidate for outpatient antibiotic therapy due to his history of persistent fevers and polysubstance abuse.  Assessment & Plan: Principal Problem:   Acute osteomyelitis of lumbar spine (HCC) Active Problems:   Nicotine dependence, cigarettes, uncomplicated   Toxic metabolic encephalopathy   Polysubstance overdose   Acute kidney injury (HCC)   Chronic pain syndrome   SIRS (systemic inflammatory response syndrome) (HCC)   Delirium   Altered mental status  Acute lumbar and thoracic spine osteomyelitis/discitis. - Continue ceftriaxone, vancomycin through 6/8 per ID recommendations. Not candidate for outpatient IV antibiotics due to polysubstance abuse, lack of social supports; has not been able to be placed by TOC.  - Vanc level this AM satisfactory. Appreciate pharmacy dosing. - Continue pain control as ordered, multimodal, including: 1. Acetaminophen 1000 mg q8h 2. Gabapentin 300 mg BID 3. Ibuprofen 400 mg TID 4. Lidocaine patch 5. Oxycodone IR 5-10 mg q4h prn 6. Oxycodone ER 10 mg BID 7. Flexeril prn muscle spasms.  - Continue with bowel regimen -  OOB, Continue regular PT/OT - Orthopedics, Dr. Otelia Sergeant, has been made aware of lumbar and thoracic MRI findings and per pt has seen him while in the hospital.  Polysubstance and tobacco use: PCP, heroin, cocaine, EtOH binge drinking reported prior to admission. - No longer showing evidence of intoxication or withdrawal.  - Cessation counseling provided.  - Continue empiric thiamine.  -  Continue nicotine patch.  Schizophrenia, depression: Quiescent currently.  - Continuebupropion, benztropine, monthly invega (given 5/13) - Follow up with ACT team at discharge per psychiatry.  Acute toxic encephalopathy: Resolved.  HTN: Well controlled - Continue diltiazem, losartan. Renal function stable, wnl.  T2DM: Well controlled with HbA1c 6.6%.  - Continue SSI for tight control with systemic infection.   GERD:  - Continue PPI  Vitamin B12 deficiency:  - Continue supplementation.  DVT prophylaxis: Lovenox Code Status: Full Family Communication: None at bedside Disposition Plan: Status is: Inpatient  Remains inpatient appropriate because:Unsafe d/c plan  Dispo: The patient is from: Home              Anticipated d/c is to: Home              Anticipated d/c date is: 09/05/2019              Patient currently is not medically stable to d/c.  Consultants:   PCCM primary on 4/29  Neurology 5/3  ID 5/7  Psychiatry 5/11  Procedures:   Lumbar puncture  PICC placement  Antimicrobials:  Vancomycin, ceftriaxone through 6/8   Subjective: Back pain is stable, improved with oxycodone. Working with PT, denies numbness or weakness in legs.   Objective: Vitals:   08/22/19 1315 08/22/19 2000 08/23/19 0455 08/23/19 1345  BP: 106/76 118/84 121/87 Marland Kitchen)  122/91  Pulse: 100 87 91 93  Resp: 15 18 18 16   Temp: 98.2 F (36.8 C) 98.1 F (36.7 C) 99.3 F (37.4 C) 97.8 F (36.6 C)  TempSrc: Oral Oral Oral Oral  SpO2: 100% 99% 98% 98%  Weight:      Height:        Intake/Output Summary  (Last 24 hours) at 08/23/2019 1532 Last data filed at 08/23/2019 0600 Gross per 24 hour  Intake 1921.04 ml  Output 1300 ml  Net 621.04 ml   Filed Weights   07/26/19 1858 08/10/19 1612  Weight: 94.3 kg 88.2 kg    Gen: 54 y.o. male in no distress  Pulm: Non-labored breathing room air. Clear to auscultation bilaterally.  CV: Regular rate and rhythm. No murmur, rub, or gallop. No JVD, no pedal edema. GI: Abdomen soft, non-tender, non-distended, with normoactive bowel sounds. No organomegaly or masses felt. Ext: Warm, no deformities Skin: No rashes, lesions or ulcers on visualized skin Neuro: Alert and oriented. No focal neurological deficits. Psych: Judgement and insight appear normal. Mood & affect appropriate.   Data Reviewed: I have personally reviewed following labs and imaging studies  CBC: Recent Labs  Lab 08/23/19 1148  WBC 8.4  NEUTROABS 6.5  HGB 10.4*  HCT 34.7*  MCV 83.6  PLT 379   Basic Metabolic Panel: Recent Labs  Lab 08/17/19 0954 08/20/19 0420 08/22/19 0440 08/23/19 0944 08/23/19 1148  NA 136 135  --   --  136  K 3.9 4.3  --   --  4.5  CL 102 102  --   --  101  CO2 25 24  --   --  25  GLUCOSE 141* 103*  --   --  109*  BUN 10 12  --   --  13  CREATININE 0.59* 0.76 0.73 0.71 0.63  CALCIUM 8.9 8.8*  --   --  8.9   GFR: Estimated Creatinine Clearance: 118.1 mL/min (by C-G formula based on SCr of 0.63 mg/dL). Liver Function Tests: No results for input(s): AST, ALT, ALKPHOS, BILITOT, PROT, ALBUMIN in the last 168 hours. No results for input(s): LIPASE, AMYLASE in the last 168 hours. No results for input(s): AMMONIA in the last 168 hours. Coagulation Profile: No results for input(s): INR, PROTIME in the last 168 hours. Cardiac Enzymes: No results for input(s): CKTOTAL, CKMB, CKMBINDEX, TROPONINI in the last 168 hours. BNP (last 3 results) No results for input(s): PROBNP in the last 8760 hours. HbA1C: No results for input(s): HGBA1C in the last 72  hours. CBG: Recent Labs  Lab 08/22/19 1119 08/22/19 1607 08/22/19 2058 08/23/19 0710 08/23/19 1157  GLUCAP 100* 140* 107* 86 93   Lipid Profile: No results for input(s): CHOL, HDL, LDLCALC, TRIG, CHOLHDL, LDLDIRECT in the last 72 hours. Thyroid Function Tests: No results for input(s): TSH, T4TOTAL, FREET4, T3FREE, THYROIDAB in the last 72 hours. Anemia Panel: No results for input(s): VITAMINB12, FOLATE, FERRITIN, TIBC, IRON, RETICCTPCT in the last 72 hours. Urine analysis:    Component Value Date/Time   COLORURINE YELLOW 07/25/2019 1805   APPEARANCEUR HAZY (A) 07/25/2019 1805   LABSPEC 1.011 07/25/2019 1805   PHURINE 6.0 07/25/2019 1805   GLUCOSEU NEGATIVE 07/25/2019 1805   HGBUR SMALL (A) 07/25/2019 1805   BILIRUBINUR NEGATIVE 07/25/2019 1805   BILIRUBINUR negative 04/10/2016 1702   KETONESUR NEGATIVE 07/25/2019 1805   PROTEINUR 30 (A) 07/25/2019 1805   UROBILINOGEN 0.2 04/10/2016 1702   UROBILINOGEN 0.2 12/04/2013 1044   NITRITE NEGATIVE  07/25/2019 1805   LEUKOCYTESUR SMALL (A) 07/25/2019 1805   No results found for this or any previous visit (from the past 240 hour(s)).    Radiology Studies: No results found.  Scheduled Meds: . acetaminophen  1,000 mg Oral Q8H  . benztropine  1 mg Oral Daily  . buPROPion  300 mg Oral Daily  . Chlorhexidine Gluconate Cloth  6 each Topical Daily  . Chlorhexidine Gluconate Cloth  6 each Topical Daily  . cyclobenzaprine  5 mg Oral TID  . diltiazem  240 mg Oral Daily  . enoxaparin (LOVENOX) injection  40 mg Subcutaneous Daily  . folic acid  1 mg Oral Daily  . gabapentin  300 mg Oral BID  . ibuprofen  400 mg Oral TID  . insulin aspart  0-5 Units Subcutaneous QHS  . insulin aspart  0-9 Units Subcutaneous TID WC  . lidocaine  1 patch Transdermal Q24H  . losartan  50 mg Oral Daily  . multivitamin with minerals  1 tablet Oral Daily  . nicotine  21 mg Transdermal Daily  . oxyCODONE  10 mg Oral Q12H  . paliperidone  234 mg  Intramuscular Q30 days  . pantoprazole  40 mg Oral Daily  . polyethylene glycol  17 g Oral BID  . thiamine  250 mg Oral Daily  . vitamin B-12  2,000 mcg Oral Daily   Continuous Infusions: . sodium chloride Stopped (07/28/19 1838)  . cefTRIAXone (ROCEPHIN)  IV 2 g (08/23/19 1314)  . vancomycin 1,500 mg (08/23/19 0940)     LOS: 28 days   Time spent: 25 minutes.  Tyrone Nine, MD [facility] [email or web address] 08/23/2019, 3:32 PM

## 2019-08-23 NOTE — Progress Notes (Signed)
Physical Therapy Treatment Patient Details Name: [name] MRN: [id number] DOB: [birth year] Today's Date: 08/23/2019    History of Present Illness 54 yo male admitted with toxic metabolic encephalopathy, bacterial meningitis, unwitness fall 5/2. Hx of recent lumbar sg L3-L5 03/2019, polysubstance abuse, schizophrenia, ETOH abuse, CVA, bipolar d/o, back pain, L ankle fusion 2019, R TKA 2018, asthma.    PT Comments    Patient is making good progress with PT exhibiting increased endurance for ambulation today ambulating 200 feet. Patient continues to exhibit impulsivity and quick movements. PT cues patient to decrease step length, walk within base of support with more erect posture, and to decrease cadence in a straight line and with turns. Patient will require additional education on use of RW correctly to decrease load on spine. PT cued patient on log roll into and out of bed to decreased stresses on spine.     Follow Up Recommendations  Other (comment)(per chart review pt plans to stay in hospital until completion of his IV Antibiotics approx June 8th)     Equipment Recommendations  None recommended by PT    Recommendations for Other Services       Precautions / Restrictions Precautions Precautions: Fall Precaution Comments: "bad" L hip Required Braces or Orthoses: Spinal Brace Spinal Brace: Thoracolumbosacral orthotic;Applied in sitting position Restrictions Weight Bearing Restrictions: No    Mobility  Bed Mobility Overal bed mobility: Needs Assistance Bed Mobility: Sit to Sidelying;Rolling;Sidelying to Sit Rolling: Supervision Sidelying to sit: HOB elevated;Min guard     Sit to sidelying: Min guard General bed mobility comments: requires verbal cuing for sequencing of steps and log rolling technique in and out of bed  Transfers Overall transfer level: Needs assistance Equipment used: Rolling walker (2 wheeled) Transfers: Sit to/from [employer] Sit  to Stand: Min guard Stand pivot transfers: Min guard       General transfer comment: min guard for safety due to impulsivity and quick movements  Ambulation/Gait Ambulation/Gait assistance: Min guard Gait Distance (Feet): 200 Feet Assistive device: Rolling walker (2 wheeled) Gait Pattern/deviations: Step-through pattern;Decreased dorsiflexion - left;Trunk flexed     General Gait Details: large steps with RW out in front, quick movements, impulsivity with turns; cues to shorten steps, walk within base of support and slow cadence down for safety   Stairs             Wheelchair Mobility    Modified Rankin (Stroke Patients Only)       Balance Overall balance assessment: Needs assistance;History of Falls Sitting-balance support: No upper extremity supported;Feet supported Sitting balance-[name]: Good     Standing balance support: Bilateral upper extremity supported;During functional activity Standing balance-[name]: Poor Standing balance comment: reliant on external support in standing                            Cognition Arousal/Alertness: Awake/alert Behavior During Therapy: WFL for tasks assessed/performed Overall Cognitive Status: No family/caregiver present to determine baseline cognitive functioning                                 General Comments: impulsive, requires cuing for safety throughout mobility. For example, pt without warning stood up from bed once sitting EOB very unsafely.      Exercises      General Comments        Pertinent Vitals/Pain Pain Assessment: Faces Faces  Pain Scale: Hurts whole lot Pain Intervention(s): Limited activity within patient's tolerance;Monitored during session;Repositioned    Home Living                      Prior Function            PT Goals (current goals can now be found in the care plan section) Acute Rehab PT Goals Patient Stated Goal: to get better PT Goal  Formulation: With patient Time For Goal Achievement: 09/04/19 Potential to Achieve Goals: Good Progress towards PT goals: Progressing toward goals    Frequency    Min 2X/week      PT Plan Current plan remains appropriate    Co-evaluation              AM-PAC PT "6 Clicks" Mobility   Outcome Measure  Help needed turning from your back to your side while in a flat bed without using bedrails?: None Help needed moving from lying on your back to sitting on the side of a flat bed without using bedrails?: A Little Help needed moving to and from a bed to a chair (including a wheelchair)?: A Little Help needed standing up from a chair using your arms (e.g., wheelchair or bedside chair)?: A Little Help needed to walk in hospital room?: A Little Help needed climbing 3-5 steps with a railing? : A Little 6 Click Score: 19    End of Session Equipment Utilized During Treatment: Gait belt Activity Tolerance: Patient tolerated treatment well Patient left: with call bell/phone within reach;in bed;with bed alarm set;with nursing/sitter in room Nurse Communication: Mobility status PT Visit Diagnosis: History of falling (Z91.81);Difficulty in walking, not elsewhere classified (R26.2);Pain;Muscle weakness (generalized) (M62.81)     Time: 2956-2130 PT Time Calculation (min) (ACUTE ONLY): 28 min  Charges:  $Gait Training: 8-22 mins $Self Care/Home Management: 8-22                     Katina Dung. Hartnett-Rands, MS, PT [facility] PT [facility] #[id number] 08/23/2019, 2:37 PM

## 2019-08-23 NOTE — Plan of Care (Signed)
  Problem: Education: Goal: Knowledge of General Education information will improve Description Including pain rating scale, medication(s)/side effects and non-pharmacologic comfort measures Outcome: Progressing   

## 2019-08-24 LAB — GLUCOSE, CAPILLARY
Glucose-Capillary: 85 mg/dL (ref 70–99)
Glucose-Capillary: 97 mg/dL (ref 70–99)
Glucose-Capillary: 94 mg/dL (ref 70–99)
Glucose-Capillary: 106 mg/dL — ABNORMAL HIGH (ref 70–99)

## 2019-08-24 NOTE — Progress Notes (Signed)
PROGRESS NOTE  [name]  ZOX:[id number] DOB: [birth year] DOA: 07/25/2019 PCP: [name], MD   Brief Narrative: [name] is a 54 y.o. male with a history of lumbar laminectomy/fusion, polysubstance abuse, schizophrenia, CVA, asthma, and GERD who was brought to the ED 4/27 by his girlfriend due to lethargy. He was tachycardic and appeared dehydrated, reporting taking many substances the prior evening. He was initially admitted to the ICU for toxic encephalopathy, possible EtOH withdrawal on precedex and empirically treated for meningitis with resolution of fever.Further work-up with CT of the lumbar spine showed osteomyelitis/discitis with epidural phlegmon not amenable to drainage.Infectious disease recommended 6 weeks of intravenous antibiotic therapy with ceftriaxone and vancomycin (final day 6/8).  Patient is not a candidate for outpatient antibiotic therapy due to his history of persistent fevers and polysubstance abuse.  Assessment & Plan: Principal Problem:   Acute osteomyelitis of lumbar spine (HCC) Active Problems:   Nicotine dependence, cigarettes, uncomplicated   Toxic metabolic encephalopathy   Polysubstance overdose   Acute kidney injury (HCC)   Chronic pain syndrome   SIRS (systemic inflammatory response syndrome) (HCC)   Delirium   Altered mental status  Acute lumbar and thoracic spine osteomyelitis/discitis. - Continue ceftriaxone, vancomycin through 6/8 per ID recommendations. Not candidate for outpatient IV antibiotics due to polysubstance abuse, lack of social supports; has not been able to be placed by TOC. D/w CM this AM. - Vanc level satisfactory. Appreciate pharmacy dosing. - Continue pain control as ordered, multimodal, including: 1. Acetaminophen 1000 mg q8h 2. Gabapentin 300 mg BID 3. Ibuprofen 400 mg TID 4. Lidocaine patch 5. Oxycodone IR 5-10 mg q4h prn 6. Oxycodone ER 10 mg BID 7. Flexeril prn muscle spasms.  - Continue with bowel  regimen - OOB, Continue regular PT/OT - Orthopedics, Dr. Otelia Sergeant, has been made aware of lumbar and thoracic MRI findings and per pt has seen him while in the hospital.  Polysubstance and tobacco use: PCP, heroin, cocaine, EtOH binge drinking reported prior to admission. - No longer showing evidence of intoxication or withdrawal.  - Cessation counseling provided.  - Continue empiric thiamine.  - Continue nicotine patch.  Schizophrenia, depression: Quiescent currently.  - Continuebupropion, benztropine, monthly invega (given 5/13) - Follow up with ACT team at discharge per psychiatry.  Acute toxic encephalopathy: Resolved.  HTN: Well controlled - Continue diltiazem, losartan. Renal function stable, wnl.  T2DM: Well controlled with HbA1c 6.6%.  - Continue SSI for tight control with systemic infection.   GERD:  - Continue PPI  Vitamin B12 deficiency:  - Continue supplementation.  DVT prophylaxis: Lovenox Code Status: Full Family Communication: None at bedside Disposition Plan: Status is: Inpatient  Remains inpatient appropriate because:Unsafe d/c plan  Dispo: The patient is from: Home              Anticipated d/c is to: Home              Anticipated d/c date is: 09/05/2019              Patient currently is not medically stable to d/c.  Consultants:   PCCM primary on 4/29  Neurology 5/3  ID 5/7  Psychiatry 5/11  Procedures:   Lumbar puncture  PICC placement  Antimicrobials:  Vancomycin, ceftriaxone through 6/8   Subjective: No major changes, having some intermittent bilateral hip pain that improves after he gets up and walks around, made it all the way around the unit yesterday, plans to repeat that today. No fevers or  new numbness/weakness.  Objective: Vitals:   08/23/19 1345 08/23/19 2204 08/24/19 0622 08/24/19 1300  BP: (!) 122/91 126/89 (!) 125/95 (!) 157/62  Pulse: 93 93 89 68  Resp: 16 18 18 16   Temp: 97.8 F (36.6 C) 98.6 F (37 C) 98.6 F (37  C) 99.7 F (37.6 C)  TempSrc: Oral Oral Oral Oral  SpO2: 98% 100% 100%   Weight:      Height:        Intake/Output Summary (Last 24 hours) at 08/24/2019 1815 Last data filed at 08/24/2019 1440 Gross per 24 hour  Intake 210 ml  Output 400 ml  Net -190 ml   Filed Weights   07/26/19 1858 08/10/19 1612  Weight: 94.3 kg 88.2 kg   Gen: 54 y.o. male in no distress Pulm: Nonlabored breathing room air. Clear. CV: Regular rate and rhythm. No murmur, rub, or gallop. No JVD, no dependent edema. GI: Abdomen soft, non-tender, non-distended, with normoactive bowel sounds.  Ext: Warm, no deformities Skin: No rashes, lesions or ulcers on visualized skin. Neuro: Alert and oriented. No focal neurological deficits. Psych: Judgement and insight appear fair. Mood euthymic & affect congruent. Behavior is appropriate.    Data Reviewed: I have personally reviewed following labs and imaging studies  CBC: Recent Labs  Lab 08/23/19 1148  WBC 8.4  NEUTROABS 6.5  HGB 10.4*  HCT 34.7*  MCV 83.6  PLT 379   Basic Metabolic Panel: Recent Labs  Lab 08/20/19 0420 08/22/19 0440 08/23/19 0944 08/23/19 1148  NA 135  --   --  136  K 4.3  --   --  4.5  CL 102  --   --  101  CO2 24  --   --  25  GLUCOSE 103*  --   --  109*  BUN 12  --   --  13  CREATININE 0.76 0.73 0.71 0.63  CALCIUM 8.8*  --   --  8.9   GFR: Estimated Creatinine Clearance: 118.1 mL/min (by C-G formula based on SCr of 0.63 mg/dL). Liver Function Tests: No results for input(s): AST, ALT, ALKPHOS, BILITOT, PROT, ALBUMIN in the last 168 hours. No results for input(s): LIPASE, AMYLASE in the last 168 hours. No results for input(s): AMMONIA in the last 168 hours. Coagulation Profile: No results for input(s): INR, PROTIME in the last 168 hours. Cardiac Enzymes: No results for input(s): CKTOTAL, CKMB, CKMBINDEX, TROPONINI in the last 168 hours. BNP (last 3 results) No results for input(s): PROBNP in the last 8760 hours. HbA1C: No  results for input(s): HGBA1C in the last 72 hours. CBG: Recent Labs  Lab 08/23/19 1626 08/23/19 2200 08/24/19 0741 08/24/19 1204 08/24/19 1626  GLUCAP 98 122* 85 97 94   Lipid Profile: No results for input(s): CHOL, HDL, LDLCALC, TRIG, CHOLHDL, LDLDIRECT in the last 72 hours. Thyroid Function Tests: No results for input(s): TSH, T4TOTAL, FREET4, T3FREE, THYROIDAB in the last 72 hours. Anemia Panel: No results for input(s): VITAMINB12, FOLATE, FERRITIN, TIBC, IRON, RETICCTPCT in the last 72 hours. Urine analysis:    Component Value Date/Time   COLORURINE YELLOW 07/25/2019 1805   APPEARANCEUR HAZY (A) 07/25/2019 1805   LABSPEC 1.011 07/25/2019 1805   PHURINE 6.0 07/25/2019 1805   GLUCOSEU NEGATIVE 07/25/2019 1805   HGBUR SMALL (A) 07/25/2019 1805   BILIRUBINUR NEGATIVE 07/25/2019 1805   BILIRUBINUR negative 04/10/2016 1702   KETONESUR NEGATIVE 07/25/2019 1805   PROTEINUR 30 (A) 07/25/2019 1805   UROBILINOGEN 0.2 04/10/2016 1702  UROBILINOGEN 0.2 12/04/2013 1044   NITRITE NEGATIVE 07/25/2019 1805   LEUKOCYTESUR SMALL (A) 07/25/2019 1805   No results found for this or any previous visit (from the past 240 hour(s)).    Radiology Studies: No results found.  Scheduled Meds: . acetaminophen  1,000 mg Oral Q8H  . benztropine  1 mg Oral Daily  . buPROPion  300 mg Oral Daily  . Chlorhexidine Gluconate Cloth  6 each Topical Daily  . Chlorhexidine Gluconate Cloth  6 each Topical Daily  . cyclobenzaprine  5 mg Oral TID  . diltiazem  240 mg Oral Daily  . enoxaparin (LOVENOX) injection  40 mg Subcutaneous Daily  . folic acid  1 mg Oral Daily  . gabapentin  300 mg Oral BID  . ibuprofen  400 mg Oral TID  . insulin aspart  0-5 Units Subcutaneous QHS  . insulin aspart  0-9 Units Subcutaneous TID WC  . lidocaine  1 patch Transdermal Q24H  . losartan  50 mg Oral Daily  . multivitamin with minerals  1 tablet Oral Daily  . nicotine  21 mg Transdermal Daily  . oxyCODONE  10 mg Oral  Q12H  . paliperidone  234 mg Intramuscular Q30 days  . pantoprazole  40 mg Oral Daily  . polyethylene glycol  17 g Oral BID  . thiamine  250 mg Oral Daily  . vitamin B-12  2,000 mcg Oral Daily   Continuous Infusions: . sodium chloride Stopped (07/28/19 1838)  . cefTRIAXone (ROCEPHIN)  IV 2 g (08/24/19 1008)  . vancomycin 1,500 mg (08/24/19 1100)     LOS: 29 days   Time spent: 15 minutes.  Tyrone Nine, MD [facility] [email or web address] 08/24/2019, 6:15 PM

## 2019-08-24 NOTE — Progress Notes (Signed)
Occupational Therapy Treatment Patient Details Name: [name] MRN: [id number] DOB: Feb 17, 1966 Today's Date: 08/24/2019    History of present illness 54 yo male admitted with toxic metabolic encephalopathy, bacterial meningitis, unwitness fall 5/2. Hx of recent lumbar sg L3-L5 03/2019, polysubstance abuse, schizophrenia, ETOH abuse, CVA, bipolar d/o, back pain, L ankle fusion 2019, R TKA 2018, asthma.   OT comments  Patient agreeable to OT, participate in functional ambulation with rolling walker at supervision/min guard due to impulsivity with min cues for safety. Pt min G in standing to pull up socks, set up at EOB to don new socks. Patient does require mod cues to problem solve donning TLSO.    Follow Up Recommendations  No OT follow up    Equipment Recommendations  None recommended by OT       Precautions / Restrictions Precautions Precautions: Fall Precaution Comments: "bad" L hip Required Braces or Orthoses: Spinal Brace Spinal Brace: Thoracolumbosacral orthotic;Applied in sitting position Restrictions Weight Bearing Restrictions: No       Mobility Bed Mobility Overal bed mobility: Needs Assistance Bed Mobility: Rolling;Sidelying to Sit Rolling: Supervision Sidelying to sit: Supervision;HOB elevated          Transfers Overall transfer level: Needs assistance Equipment used: Rolling walker (2 wheeled) Transfers: Sit to/from Stand Sit to Stand: Supervision;Min guard         General transfer comment: min guard for safety due to impulsivity and quick movements    Balance Overall balance assessment: Needs assistance;History of Falls Sitting-balance support: No upper extremity supported;Feet supported Sitting balance-[name]: Good     Standing balance support: Bilateral upper extremity supported;During functional activity Standing balance-[name]: Poor Standing balance comment: reliant on external support in standing                            ADL either performed or assessed with clinical judgement   ADL Overall ADL's : Needs assistance/impaired                     Lower Body Dressing: Supervision/safety;Sit to/from stand;Sitting/lateral leans Lower Body Dressing Details (indicate cue type and reason): to pull up socks in standing, and to don new socks seated EOB Toilet Transfer: Supervision/safety;Min guard;RW;Ambulation Statistician Details (indicate cue type and reason): simulated with functional mobility, supervision to min guard for safety         Functional mobility during ADLs: Supervision/safety;Min guard;Rolling walker                 Cognition Arousal/Alertness: Awake/alert Behavior During Therapy: WFL for tasks assessed/performed Overall Cognitive Status: No family/caregiver present to determine baseline cognitive functioning                                 General Comments: mild impulsivity requiring min cues to redirect                   Pertinent Vitals/ Pain       Pain Assessment: Faces Faces Pain Scale: Hurts little more Pain Location: back Pain Descriptors / Indicators: Dull Pain Intervention(s): Monitored during session         Frequency  Min 1X/week        Progress Toward Goals  OT Goals(current goals can now be found in the care plan section)  Progress towards OT goals: Progressing toward goals  Acute Rehab OT Goals  Patient Stated Goal: to get better OT Goal Formulation: With patient Time For Goal Achievement: 09/07/19 Potential to Achieve Goals: Good ADL Goals Pt Will Perform Grooming: with supervision;standing Pt Will Perform Lower Body Dressing: with supervision;sit to/from stand Pt Will Transfer to Toilet: with supervision;ambulating;regular height toilet Pt Will Perform Toileting - Clothing Manipulation and hygiene: with supervision;sit to/from stand Pt Will Perform Tub/Shower Transfer: with supervision;shower  seat;ambulating Additional ADL Goal #1: Patient will demonstrate improving balance by ambulating in room without LOB with DME with supervisoin only  Plan Discharge plan remains appropriate;Frequency needs to be updated       AM-PAC OT "6 Clicks" Daily Activity     Outcome Measure   Help from another person eating meals?: None Help from another person taking care of personal grooming?: A Little Help from another person toileting, which includes using toliet, bedpan, or urinal?: A Little Help from another person bathing (including washing, rinsing, drying)?: A Little Help from another person to put on and taking off regular upper body clothing?: A Little Help from another person to put on and taking off regular lower body clothing?: A Little 6 Click Score: 19    End of Session Equipment Utilized During Treatment: Rolling walker;Back brace  OT Visit Diagnosis: Unsteadiness on feet (R26.81);Pain Pain - part of body: (back)   Activity Tolerance Patient tolerated treatment well   Patient Left in bed;with call bell/phone within reach           Time: 5366-4403 OT Time Calculation (min): 18 min  Charges: OT General Charges $OT Visit: 1 Visit OT Treatments $Self Care/Home Management : 8-22 mins  Marlyce Huge OT Pager: [phone]   Carmelia Roller 08/24/2019, 2:34 PM

## 2019-08-25 LAB — GLUCOSE, CAPILLARY
Glucose-Capillary: 95 mg/dL (ref 70–99)
Glucose-Capillary: 96 mg/dL (ref 70–99)
Glucose-Capillary: 119 mg/dL — ABNORMAL HIGH (ref 70–99)

## 2019-08-25 NOTE — Progress Notes (Signed)
Nutrition Follow-up  DOCUMENTATION CODES:   Not applicable  INTERVENTION:  - continue Hormel Shake once/day and Magic Cup BID. - weigh patient today.   NUTRITION DIAGNOSIS:   Increased nutrient needs related to acute illness as evidenced by estimated needs. -ongoing  GOAL:   Patient will meet greater than or equal to 90% of their needs -met  MONITOR:   PO intake, Supplement acceptance, Labs, Weight trends  ASSESSMENT:   53 year old male with medical history of HTN, prior CVA, GERD, depression/anxiety, chronic pain syndrome, alcohol abuse, and polysubstance abuse including PCP, heroin, and cocaine. He presented to the ED with tachycardia and tachypnea. He was noted to be encephalopathic and required ICU admission for precedex. There was concern for bacterial meningitis and ID recommended 14 days of abx. CT of L-spine showed osteomyelitis/discitis. ID recommended 6 weeks IV abx (to end 6/8) and patient is not a candidate for home PICC due to hx of substance abuse/use.  Patient continues to eat mainly 100% at meals. He has not been weighed since 5/13.   Per notes, patient with acute lumbar and thoracic spine osteomyelitis/discitis on IV abx until 6/8.     Labs reviewed; CBGs: 95 mg/dl. Medications reviewed; 1 mg folvite/day, sliding scale novolog, 1 tablet multivitamin with minerals/day, 17 g miralax BID, 250 mg oral thiamine/day.    Diet Order:   Diet Order            Diet regular Room service appropriate? Yes; Fluid consistency: Thin  Diet effective now              EDUCATION NEEDS:   Not appropriate for education at this time  Skin:  Skin Assessment: Reviewed RN Assessment  Last BM:  5/26  Height:   Ht Readings from Last 1 Encounters:  07/26/19 _0  (1.778 m)    Weight:   Wt Readings from Last 1 Encounters:  08/10/19 88.2 kg     Estimated Nutritional Needs:  Kcal:  2200-2400 kcal Protein:  110-125 grams Fluid:  >/= 2.2 L/day     Jarome Matin, MS, RD, LDN, CNSC Inpatient Clinical Dietitian RD pager # available in [facility]  After hours/weekend pager # available in [facility]

## 2019-08-25 NOTE — Progress Notes (Signed)
Pharmacy Antibiotic Note  [name] is a 54 y.o. male admitted on 07/25/2019 with altered mental status. Patient previously on Acyclovir, Vancomycin, Ampicillin, and Ceftriaxone for meningitis. Antibiotics  for meningitis completed . However,  As of 5/7 plan per ID, to continue vancomycin and rocephin for 6 weeks total - last dose on 6/8 for discitis/osteomyelitis.    Today, 08/25/19 Day 30/42 Vanc/Rocephin Labs from 5/26:  SCr stable 0.63, WBC wnl   Vanc Trough = 16 mcg/ml which is therapeutic on 1500mg  q12h  Afebrile  Plan:  Continue vancomycin 1500mg  IV q12h  Rocephin 2g IV daily per Md  Plan to recheck trough on Wed 6/2  Height: 5\' 10"  (177.8 cm) Weight: 88.2 kg (194 lb 6.4 oz) IBW/kg (Calculated) : 73  Temp (24hrs), Avg:98.2 F (36.8 C), Min:97.8 F (36.6 C), Max:98.5 F (36.9 C)  Recent Labs  Lab 08/20/19 0420 08/22/19 0440 08/23/19 0944 08/23/19 1148  WBC  --   --   --  8.4  CREATININE 0.76 0.73 0.71 0.63  VANCOTROUGH  --   --  16  --     Estimated Creatinine Clearance: 118.1 mL/min (by C-G formula based on SCr of 0.63 mg/dL).    No Known Allergies  /28 Ceftriaxone x 1, 4/30 >> 4/28 Doxycycline >> 4/28 4/29 Ampicillin/sulbactam >> 4/30 4/30 Ampicillin>> 5/3 AM, resumed 5/3 PM >> 5/10 4/30 Acyclovir >> 5/7 4/30 Vancomycin >>   Dose adjustments/levels this admission: 5/4 VT = 16 on 1500 q12 5/10 VT = 13, continue 5/19 VT = 15, continue  5/26 VT = 16, continue  Microbiology results: 4/27 Influenza A/B: neg, Covid: neg 4/27 RPR: nonreactive 4/27 HIV: nonreactive 4/27 BCx: ngF 4/27 UCx: ngF  4/29 MRSA PCR: neg 5/2 CSF: gram stain neg,  ngF 5/2 CSF fungal: No Fungus Isolated After 21 days  5/2 CSF HSV: Neg 5/2 CSF VDRL: neg  Thank you for allowing pharmacy to be a part of this patient's care.  Peggyann Juba, PharmD, BCPS Pharmacy: [phone] 08/25/2019 1:27 PM

## 2019-08-25 NOTE — Progress Notes (Signed)
PROGRESS NOTE  [name]  FIE:[id number] DOB: 1965/10/24 DOA: 07/25/2019 PCP: [name], MD   Brief Narrative: [name] is a 54 y.o. male with a history of lumbar laminectomy/fusion, polysubstance abuse, schizophrenia, CVA, asthma, and GERD who was brought to the ED 4/27 by his girlfriend due to lethargy. He was tachycardic and appeared dehydrated, reporting taking many substances the prior evening. He was initially admitted to the ICU for toxic encephalopathy, possible EtOH withdrawal on precedex and empirically treated for meningitis with resolution of fever.Further work-up with CT of the lumbar spine showed osteomyelitis/discitis with epidural phlegmon not amenable to drainage.Infectious disease recommended 6 weeks of intravenous antibiotic therapy with ceftriaxone and vancomycin (final day 6/8).  Patient is not a candidate for outpatient antibiotic therapy due to his history of polysubstance abuse.  Assessment & Plan: Principal Problem:   Acute osteomyelitis of lumbar spine (HCC) Active Problems:   Nicotine dependence, cigarettes, uncomplicated   Toxic metabolic encephalopathy   Polysubstance overdose   Acute kidney injury (HCC)   Chronic pain syndrome   SIRS (systemic inflammatory response syndrome) (HCC)   Delirium   Altered mental status  Acute lumbar and thoracic spine osteomyelitis/discitis. - Continue ceftriaxone, vancomycin through 6/8 per ID recommendations. Not candidate for outpatient IV antibiotics due to polysubstance abuse, lack of social supports; has not been able to be placed by TOC. No evidence of complications at this time, urged to continue ambulation/OOB as much as tolerable. - Vanc level satisfactory. Appreciate pharmacy dosing. - Continue pain control as ordered, multimodal, including: 1. Acetaminophen 1000 mg q8h 2. Gabapentin 300 mg BID 3. Ibuprofen 400 mg TID 4. Lidocaine patch 5. Oxycodone IR 5-10 mg q4h prn 6. Oxycodone ER 10 mg  BID 7. Flexeril prn muscle spasms.  - Continue with bowel regimen - OOB, Continue regular PT/OT - Orthopedics, Dr. Otelia Sergeant, has been made aware of lumbar and thoracic MRI findings and per pt has seen him while in the hospital.  Polysubstance and tobacco use: PCP, heroin, cocaine, EtOH binge drinking reported prior to admission. - No longer showing evidence of intoxication or withdrawal.  - Cessation counseling provided.  - Continue empiric thiamine.  - Continue nicotine patch.  Schizophrenia, depression: Quiescent currently.  - Continuebupropion, benztropine, monthly invega (given 5/13) - Follow up with ACT team at discharge per psychiatry.  Acute toxic encephalopathy: Resolved.  HTN: Well controlled - Continue diltiazem, losartan. Renal function stable, wnl.  T2DM: Well controlled with HbA1c 6.6%.  - Continue SSI for tight control with systemic infection.   GERD:  - Continue PPI  Vitamin B12 deficiency:  - Continue supplementation.  DVT prophylaxis: Lovenox Code Status: Full Family Communication: None at bedside Disposition Plan: Status is: Inpatient  Remains inpatient appropriate because:Unsafe d/c plan  Dispo: The patient is from: Home              Anticipated d/c is to: Home              Anticipated d/c date is: 09/05/2019              Patient currently is not medically stable to d/c.  Consultants:   PCCM primary on 4/29  Neurology 5/3  ID 5/7  Psychiatry 5/11  Procedures:   Lumbar puncture  PICC placement  Antimicrobials:  Vancomycin, ceftriaxone through 6/8   Subjective: Walked around unit again yesterday. His major complaint is bilateral anterior hip pain for many days that gets better after he walks around. No fevers or other  complaints.  Objective: Vitals:   08/24/19 0622 08/24/19 1300 08/24/19 2112 08/25/19 0444  BP: (!) 125/95 (!) 157/62 (!) 141/97 108/87  Pulse: 89 68 92 89  Resp: 18 16 20 18   Temp: 98.6 F (37 C) 99.7 F (37.6 C)  97.8 F (36.6 C) 98.5 F (36.9 C)  TempSrc: Oral Oral    SpO2: 100%  100% 96%  Weight:      Height:        Intake/Output Summary (Last 24 hours) at 08/25/2019 1053 Last data filed at 08/24/2019 1440 Gross per 24 hour  Intake 10 ml  Output --  Net 10 ml   Filed Weights   07/26/19 1858 08/10/19 1612  Weight: 94.3 kg 88.2 kg   Gen: 54 y.o. male in no distress Pulm: Nonlabored breathing room air. Clear. CV: Regular rate and rhythm. No murmur, rub, or gallop. No JVD, no dependent edema. GI: Abdomen soft, non-tender, non-distended, with normoactive bowel sounds.  Ext: Warm, no deformities Skin: No rashes, lesions or ulcers on visualized skin. Midline lumbar surgical scar healed, tenderness overlaying this area without induration, palpable deformity. Neuro: Alert and oriented. No focal neurological deficits. Psych: Judgement and insight appear fair. Mood euthymic & affect congruent. Behavior is appropriate.    Data Reviewed: I have personally reviewed following labs and imaging studies  CBC: Recent Labs  Lab 08/23/19 1148  WBC 8.4  NEUTROABS 6.5  HGB 10.4*  HCT 34.7*  MCV 83.6  PLT 379   Basic Metabolic Panel: Recent Labs  Lab 08/20/19 0420 08/22/19 0440 08/23/19 0944 08/23/19 1148  NA 135  --   --  136  K 4.3  --   --  4.5  CL 102  --   --  101  CO2 24  --   --  25  GLUCOSE 103*  --   --  109*  BUN 12  --   --  13  CREATININE 0.76 0.73 0.71 0.63  CALCIUM 8.8*  --   --  8.9   GFR: Estimated Creatinine Clearance: 118.1 mL/min (by C-G formula based on SCr of 0.63 mg/dL). Liver Function Tests: No results for input(s): AST, ALT, ALKPHOS, BILITOT, PROT, ALBUMIN in the last 168 hours. No results for input(s): LIPASE, AMYLASE in the last 168 hours. No results for input(s): AMMONIA in the last 168 hours. Coagulation Profile: No results for input(s): INR, PROTIME in the last 168 hours. Cardiac Enzymes: No results for input(s): CKTOTAL, CKMB, CKMBINDEX, TROPONINI in  the last 168 hours. BNP (last 3 results) No results for input(s): PROBNP in the last 8760 hours. HbA1C: No results for input(s): HGBA1C in the last 72 hours. CBG: Recent Labs  Lab 08/24/19 0741 08/24/19 1204 08/24/19 1626 08/24/19 2115 08/25/19 0758  GLUCAP 85 97 94 106* 95   Lipid Profile: No results for input(s): CHOL, HDL, LDLCALC, TRIG, CHOLHDL, LDLDIRECT in the last 72 hours. Thyroid Function Tests: No results for input(s): TSH, T4TOTAL, FREET4, T3FREE, THYROIDAB in the last 72 hours. Anemia Panel: No results for input(s): VITAMINB12, FOLATE, FERRITIN, TIBC, IRON, RETICCTPCT in the last 72 hours. Urine analysis:    Component Value Date/Time   COLORURINE YELLOW 07/25/2019 1805   APPEARANCEUR HAZY (A) 07/25/2019 1805   LABSPEC 1.011 07/25/2019 1805   PHURINE 6.0 07/25/2019 1805   GLUCOSEU NEGATIVE 07/25/2019 1805   HGBUR SMALL (A) 07/25/2019 1805   BILIRUBINUR NEGATIVE 07/25/2019 1805   BILIRUBINUR negative 04/10/2016 1702   KETONESUR NEGATIVE 07/25/2019 1805   PROTEINUR 30 (  A) 07/25/2019 1805   UROBILINOGEN 0.2 04/10/2016 1702   UROBILINOGEN 0.2 12/04/2013 1044   NITRITE NEGATIVE 07/25/2019 1805   LEUKOCYTESUR SMALL (A) 07/25/2019 1805   No results found for this or any previous visit (from the past 240 hour(s)).    Radiology Studies: No results found.  Scheduled Meds: . acetaminophen  1,000 mg Oral Q8H  . benztropine  1 mg Oral Daily  . buPROPion  300 mg Oral Daily  . Chlorhexidine Gluconate Cloth  6 each Topical Daily  . Chlorhexidine Gluconate Cloth  6 each Topical Daily  . cyclobenzaprine  5 mg Oral TID  . diltiazem  240 mg Oral Daily  . enoxaparin (LOVENOX) injection  40 mg Subcutaneous Daily  . folic acid  1 mg Oral Daily  . gabapentin  300 mg Oral BID  . ibuprofen  400 mg Oral TID  . insulin aspart  0-5 Units Subcutaneous QHS  . insulin aspart  0-9 Units Subcutaneous TID WC  . lidocaine  1 patch Transdermal Q24H  . losartan  50 mg Oral Daily  .  multivitamin with minerals  1 tablet Oral Daily  . nicotine  21 mg Transdermal Daily  . oxyCODONE  10 mg Oral Q12H  . paliperidone  234 mg Intramuscular Q30 days  . pantoprazole  40 mg Oral Daily  . polyethylene glycol  17 g Oral BID  . thiamine  250 mg Oral Daily  . vitamin B-12  2,000 mcg Oral Daily   Continuous Infusions: . sodium chloride Stopped (07/28/19 1838)  . cefTRIAXone (ROCEPHIN)  IV 2 g (08/25/19 0914)  . vancomycin 1,500 mg (08/25/19 1045)     LOS: 30 days   Time spent: 15 minutes.  Tyrone Nine, MD [facility] [email or web address] 08/25/2019, 10:53 AM

## 2019-08-26 LAB — GLUCOSE, CAPILLARY
Glucose-Capillary: 96 mg/dL (ref 70–99)
Glucose-Capillary: 114 mg/dL — ABNORMAL HIGH (ref 70–99)
Glucose-Capillary: 124 mg/dL — ABNORMAL HIGH (ref 70–99)
Glucose-Capillary: 106 mg/dL — ABNORMAL HIGH (ref 70–99)

## 2019-08-26 MED ORDER — OXYCODONE-ACETAMINOPHEN 7.5-325 MG PO TABS
1.0000 | ORAL_TABLET | ORAL | Status: DC | PRN
  Administered 2019-08-26 – 2019-09-01 (×22): 2 via ORAL
  Administered 2019-09-01: 1 via ORAL
  Administered 2019-09-02: 2 via ORAL
  Administered 2019-09-02: 1 via ORAL
  Administered 2019-09-02: 2 via ORAL
  Filled 2019-08-26 (×2): qty 2
  Filled 2019-08-26: qty 1
  Filled 2019-08-26 (×13): qty 2
  Filled 2019-08-26: qty 1
  Filled 2019-08-26: qty 2
  Filled 2019-08-26: qty 1
  Filled 2019-08-26 (×9): qty 2

## 2019-08-26 NOTE — Progress Notes (Signed)
PROGRESS NOTE  [name]  XLK:[id number] DOB: [birth year] DOA: 07/25/2019 PCP: [name], MD   Brief Narrative: [name] is a 54 y.o. male with a history of lumbar laminectomy/fusion, polysubstance abuse, schizophrenia, CVA, asthma, and GERD who was brought to the ED 4/27 by his girlfriend due to lethargy. He was tachycardic and appeared dehydrated, reporting taking many substances the prior evening. He was initially admitted to the ICU for toxic encephalopathy, possible EtOH withdrawal on precedex and empirically treated for meningitis with resolution of fever.Further work-up with CT of the lumbar spine showed osteomyelitis/discitis with epidural phlegmon not amenable to drainage.Infectious disease recommended 6 weeks of intravenous antibiotic therapy with ceftriaxone and vancomycin (final day 6/8).  Patient is not a candidate for outpatient antibiotic therapy due to his history of polysubstance abuse.  Assessment & Plan: Principal Problem:   Acute osteomyelitis of lumbar spine (HCC) Active Problems:   Nicotine dependence, cigarettes, uncomplicated   Toxic metabolic encephalopathy   Polysubstance overdose   Acute kidney injury (HCC)   Chronic pain syndrome   SIRS (systemic inflammatory response syndrome) (HCC)   Delirium   Altered mental status  Acute lumbar and thoracic spine osteomyelitis/discitis. - Continue ceftriaxone, vancomycin through 6/8 per ID recommendations. Not candidate for outpatient IV antibiotics due to polysubstance abuse, lack of social supports; has not been able to be placed by TOC. No evidence of complications at this time, urged to continue ambulation/OOB as much as tolerable. - Vanc level satisfactory. Appreciate pharmacy dosing. - Continue pain control as ordered, multimodal, including: 1. Acetaminophen 1000 mg q8h 2. Gabapentin 300 mg BID 3. Ibuprofen 400 mg TID 4. Lidocaine patch 5. Percocet 7.5-325 mg 1-2 tabs q4h prn. Suspect opioid  tolerance given duration of Tx. 6. Oxycodone ER 10 mg BID 7. Flexeril prn muscle spasms.  - Continue with bowel regimen - OOB, Continue regular PT/OT - Orthopedics, Dr. Otelia Sergeant, has been made aware of lumbar and thoracic MRI findings and per pt has seen him while in the hospital.  Polysubstance and tobacco use: PCP, heroin, cocaine, EtOH binge drinking reported prior to admission. - No longer showing evidence of intoxication or withdrawal.  - Cessation counseling provided.  - Continue empiric thiamine.  - Continue nicotine patch.  Schizophrenia, depression: Quiescent currently.  - Continuebupropion, benztropine, monthly invega (given 5/13) - Follow up with ACT team at discharge per psychiatry.  Acute toxic encephalopathy: Resolved.  HTN: Well controlled - Continue diltiazem, losartan. Renal function stable, wnl.  T2DM: Well controlled with HbA1c 6.6%.  - Continue SSI for tight control with systemic infection.   GERD:  - Continue PPI  Vitamin B12 deficiency:  - Continue supplementation.  DVT prophylaxis: Lovenox Code Status: Full Family Communication: None at bedside Disposition Plan: Status is: Inpatient  Remains inpatient appropriate because:Unsafe d/c plan  Dispo: The patient is from: Home              Anticipated d/c is to: Home              Anticipated d/c date is: 09/05/2019              Patient currently is not medically stable to d/c.  Consultants:   PCCM primary on 4/29  Neurology 5/3  ID 5/7  Psychiatry 5/11  Procedures:   Lumbar puncture  PICC placement  Antimicrobials:  Vancomycin, ceftriaxone through 6/8   Subjective: Complaining of stable pain in both hips, states the dose of pain medication no longer works very much. No  fevers.   Objective: Vitals:   08/25/19 1149 08/25/19 2021 08/26/19 0451 08/26/19 1316  BP: (!) 122/94 (!) 117/92 109/82 110/74  Pulse: 90 (!) 103 96 (!) 108  Resp: 20 18 18 16   Temp: 98.3 F (36.8 C) 98.7 F (37.1  C) 98.1 F (36.7 C) 98 F (36.7 C)  TempSrc:  Oral Oral Oral  SpO2: 100% 100% 98% 99%  Weight:      Height:        Intake/Output Summary (Last 24 hours) at 08/26/2019 1458 Last data filed at 08/26/2019 1300 Gross per 24 hour  Intake 1620 ml  Output --  Net 1620 ml   Filed Weights   07/26/19 1858 08/10/19 1612  Weight: 94.3 kg 88.2 kg   Gen: 54 y.o. male in no distress Pulm: Nonlabored breathing room air. Clear. CV: Regular rate and rhythm. No murmur, rub, or gallop. No JVD, no dependent edema. GI: Abdomen soft, non-tender, non-distended, with normoactive bowel sounds.  Ext: Warm, no deformities Skin: No rashes, lesions or ulcers on visualized skin. Midline lumbar incision scar wnl, no induration, erythema. +tenderness throughout lower back. No pain with hip ROM. Neuro: Alert and oriented. No focal neurological deficits. Psych: Judgement and insight appear fair. Mood euthymic & affect congruent. Behavior is appropriate.    Data Reviewed: I have personally reviewed following labs and imaging studies  CBC: Recent Labs  Lab 08/23/19 1148  WBC 8.4  NEUTROABS 6.5  HGB 10.4*  HCT 34.7*  MCV 83.6  PLT 379   Basic Metabolic Panel: Recent Labs  Lab 08/20/19 0420 08/22/19 0440 08/23/19 0944 08/23/19 1148  NA 135  --   --  136  K 4.3  --   --  4.5  CL 102  --   --  101  CO2 24  --   --  25  GLUCOSE 103*  --   --  109*  BUN 12  --   --  13  CREATININE 0.76 0.73 0.71 0.63  CALCIUM 8.8*  --   --  8.9   GFR: Estimated Creatinine Clearance: 118.1 mL/min (by C-G formula based on SCr of 0.63 mg/dL). Liver Function Tests: No results for input(s): AST, ALT, ALKPHOS, BILITOT, PROT, ALBUMIN in the last 168 hours. No results for input(s): LIPASE, AMYLASE in the last 168 hours. No results for input(s): AMMONIA in the last 168 hours. Coagulation Profile: No results for input(s): INR, PROTIME in the last 168 hours. Cardiac Enzymes: No results for input(s): CKTOTAL, CKMB,  CKMBINDEX, TROPONINI in the last 168 hours. BNP (last 3 results) No results for input(s): PROBNP in the last 8760 hours. HbA1C: No results for input(s): HGBA1C in the last 72 hours. CBG: Recent Labs  Lab 08/25/19 0758 08/25/19 1146 08/25/19 2124 08/26/19 0731 08/26/19 1141  GLUCAP 95 96 119* 96 114*   Lipid Profile: No results for input(s): CHOL, HDL, LDLCALC, TRIG, CHOLHDL, LDLDIRECT in the last 72 hours. Thyroid Function Tests: No results for input(s): TSH, T4TOTAL, FREET4, T3FREE, THYROIDAB in the last 72 hours. Anemia Panel: No results for input(s): VITAMINB12, FOLATE, FERRITIN, TIBC, IRON, RETICCTPCT in the last 72 hours. Urine analysis:    Component Value Date/Time   COLORURINE YELLOW 07/25/2019 1805   APPEARANCEUR HAZY (A) 07/25/2019 1805   LABSPEC 1.011 07/25/2019 1805   PHURINE 6.0 07/25/2019 1805   GLUCOSEU NEGATIVE 07/25/2019 1805   HGBUR SMALL (A) 07/25/2019 1805   BILIRUBINUR NEGATIVE 07/25/2019 1805   BILIRUBINUR negative 04/10/2016 1702   KETONESUR NEGATIVE  07/25/2019 1805   PROTEINUR 30 (A) 07/25/2019 1805   UROBILINOGEN 0.2 04/10/2016 1702   UROBILINOGEN 0.2 12/04/2013 1044   NITRITE NEGATIVE 07/25/2019 1805   LEUKOCYTESUR SMALL (A) 07/25/2019 1805   No results found for this or any previous visit (from the past 240 hour(s)).    Radiology Studies: No results found.  Scheduled Meds: . acetaminophen  1,000 mg Oral Q8H  . benztropine  1 mg Oral Daily  . buPROPion  300 mg Oral Daily  . Chlorhexidine Gluconate Cloth  6 each Topical Daily  . Chlorhexidine Gluconate Cloth  6 each Topical Daily  . cyclobenzaprine  5 mg Oral TID  . diltiazem  240 mg Oral Daily  . enoxaparin (LOVENOX) injection  40 mg Subcutaneous Daily  . folic acid  1 mg Oral Daily  . gabapentin  300 mg Oral BID  . ibuprofen  400 mg Oral TID  . insulin aspart  0-5 Units Subcutaneous QHS  . insulin aspart  0-9 Units Subcutaneous TID WC  . lidocaine  1 patch Transdermal Q24H  .  losartan  50 mg Oral Daily  . multivitamin with minerals  1 tablet Oral Daily  . nicotine  21 mg Transdermal Daily  . oxyCODONE  10 mg Oral Q12H  . paliperidone  234 mg Intramuscular Q30 days  . pantoprazole  40 mg Oral Daily  . polyethylene glycol  17 g Oral BID  . thiamine  250 mg Oral Daily  . vitamin B-12  2,000 mcg Oral Daily   Continuous Infusions: . sodium chloride Stopped (07/28/19 1838)  . cefTRIAXone (ROCEPHIN)  IV 2 g (08/26/19 0947)  . vancomycin 1,500 mg (08/26/19 1124)     LOS: 31 days   Time spent: 25 minutes.  Tyrone Nine, MD [facility] [email or web address] 08/26/2019, 2:58 PM

## 2019-08-27 LAB — GLUCOSE, CAPILLARY
Glucose-Capillary: 93 mg/dL (ref 70–99)
Glucose-Capillary: 100 mg/dL — ABNORMAL HIGH (ref 70–99)
Glucose-Capillary: 122 mg/dL — ABNORMAL HIGH (ref 70–99)
Glucose-Capillary: 97 mg/dL (ref 70–99)

## 2019-08-27 MED ORDER — TRAZODONE HCL 50 MG PO TABS
50.0000 mg | ORAL_TABLET | Freq: Every day | ORAL | Status: DC
  Administered 2019-08-27 – 2019-09-04 (×9): 50 mg via ORAL
  Filled 2019-08-27 (×9): qty 1

## 2019-08-27 MED ORDER — KETOROLAC TROMETHAMINE 30 MG/ML IJ SOLN
30.0000 mg | Freq: Once | INTRAMUSCULAR | Status: AC
  Administered 2019-08-27: 30 mg via INTRAVENOUS
  Filled 2019-08-27: qty 1

## 2019-08-27 NOTE — Progress Notes (Signed)
PROGRESS NOTE  [name]  ZOX:[id number] DOB: 1966-01-14 DOA: 07/25/2019 PCP: [name], MD   Brief Narrative: [name] is a 53 y.o. male with a history of lumbar laminectomy/fusion, polysubstance abuse, schizophrenia, CVA, asthma, and GERD who was brought to the ED 4/27 by his girlfriend due to lethargy. He was tachycardic and appeared dehydrated, reporting taking many substances the prior evening. He was initially admitted to the ICU for toxic encephalopathy, possible EtOH withdrawal on precedex and empirically treated for meningitis with resolution of fever.Further work-up with CT of the lumbar spine showed osteomyelitis/discitis with epidural phlegmon not amenable to drainage.Infectious disease recommended 6 weeks of intravenous antibiotic therapy with ceftriaxone and vancomycin (final day 6/8).  Patient is not a candidate for outpatient antibiotic therapy due to his history of polysubstance abuse.  Assessment & Plan: Principal Problem:   Acute osteomyelitis of lumbar spine (HCC) Active Problems:   Nicotine dependence, cigarettes, uncomplicated   Toxic metabolic encephalopathy   Polysubstance overdose   Acute kidney injury (HCC)   Chronic pain syndrome   SIRS (systemic inflammatory response syndrome) (HCC)   Delirium   Altered mental status  Acute lumbar and thoracic spine osteomyelitis/discitis. - Continue ceftriaxone, vancomycin through 6/8 per ID recommendations. Not candidate for outpatient IV antibiotics due to polysubstance abuse, lack of social supports; has not been able to be placed by TOC. No evidence of complications at this time, urged to continue ambulation/OOB as much as tolerable. - Vanc level satisfactory. Appreciate pharmacy dosing. - Continue pain control as ordered, multimodal, including: 1. Acetaminophen 1000 mg q8h 2. Gabapentin 300 mg BID 3. Ibuprofen 400 mg TID 4. Lidocaine patch 5. Percocet 7.5-325 mg 1-2 tabs q4h prn. Suspect opioid  tolerance given duration of Tx. 6. Oxycodone ER 10 mg BID 7. Flexeril prn muscle spasms.  - Continue with bowel regimen - OOB, Continue regular PT/OT - Orthopedics, Dr. Otelia Sergeant, has been made aware of lumbar and thoracic MRI findings and per pt has seen him while in the hospital.  Polysubstance and tobacco use: PCP, heroin, cocaine, EtOH binge drinking reported prior to admission. - No longer showing evidence of intoxication or withdrawal.  - Cessation counseling provided.  - Continue empiric thiamine.  - Continue nicotine patch.  Schizophrenia, depression: Quiescent currently.  - Continuebupropion, benztropine, monthly invega (given 5/13) - Follow up with ACT team at discharge per psychiatry.  Acute toxic encephalopathy: Resolved.  HTN: Well controlled - Continue diltiazem, losartan. Renal function stable, wnl.  T2DM: Well controlled with HbA1c 6.6%.  - Continue SSI for tight control with systemic infection.   GERD:  - Continue PPI  Vitamin B12 deficiency:  - Continue supplementation.  Insomnia:  - Add trazodone qHS  DVT prophylaxis: Lovenox Code Status: Full Family Communication: None at bedside, called and spoke with SO last 5/29 Disposition Plan: Status is: Inpatient  Remains inpatient appropriate because:Unsafe d/c plan  Dispo: The patient is from: Home              Anticipated d/c is to: Home              Anticipated d/c date is: 09/05/2019              Patient currently is not medically stable to d/c.  Consultants:   PCCM primary on 4/29  Neurology 5/3  ID 5/7  Psychiatry 5/11  Procedures:   Lumbar puncture  PICC placement  Antimicrobials:  Vancomycin, ceftriaxone through 6/8   Subjective: Pain better controlled, having difficulty  getting to sleep and remaining asleep at night.   Objective: Vitals:   08/26/19 2129 08/27/19 0437 08/27/19 0742 08/27/19 1319  BP: 107/77 (!) 124/91 (!) 125/93 (!) 123/98  Pulse: 97 83 93 (!) 104  Resp: 18 18   16   Temp: 99 F (37.2 C) 98.4 F (36.9 C) (!) 97.1 F (36.2 C) 98.6 F (37 C)  TempSrc: Oral Oral Oral Oral  SpO2: 99% 99% 99% 99%  Weight:      Height:        Intake/Output Summary (Last 24 hours) at 08/27/2019 1749 Last data filed at 08/27/2019 1300 Gross per 24 hour  Intake 600 ml  Output -  Net 600 ml   Filed Weights   07/26/19 1858 08/10/19 1612  Weight: 94.3 kg 88.2 kg   Gen: 54 y.o. male in no distress Pulm: Nonlabored breathing room air. Clear. CV: Regular rate and rhythm. No murmur, rub, or gallop. No JVD, no dependent edema. GI: Abdomen soft, non-tender, non-distended, with normoactive bowel sounds.  Ext: Warm, no deformities Skin: No rashes, lesions or ulcers on visualized skin. Neuro: Alert and oriented. No focal neurological deficits. Psych: Judgement and insight appear fair. Mood euthymic & affect congruent. Behavior is appropriate.     Data Reviewed: I have personally reviewed following labs and imaging studies  CBC: Recent Labs  Lab 08/23/19 1148  WBC 8.4  NEUTROABS 6.5  HGB 10.4*  HCT 34.7*  MCV 83.6  PLT 379   Basic Metabolic Panel: Recent Labs  Lab 08/22/19 0440 08/23/19 0944 08/23/19 1148  NA  --   --  136  K  --   --  4.5  CL  --   --  101  CO2  --   --  25  GLUCOSE  --   --  109*  BUN  --   --  13  CREATININE 0.73 0.71 0.63  CALCIUM  --   --  8.9   GFR: Estimated Creatinine Clearance: 118.1 mL/min (by C-G formula based on SCr of 0.63 mg/dL). Liver Function Tests: No results for input(s): AST, ALT, ALKPHOS, BILITOT, PROT, ALBUMIN in the last 168 hours. No results for input(s): LIPASE, AMYLASE in the last 168 hours. No results for input(s): AMMONIA in the last 168 hours. Coagulation Profile: No results for input(s): INR, PROTIME in the last 168 hours. Cardiac Enzymes: No results for input(s): CKTOTAL, CKMB, CKMBINDEX, TROPONINI in the last 168 hours. BNP (last 3 results) No results for input(s): PROBNP in the last 8760  hours. HbA1C: No results for input(s): HGBA1C in the last 72 hours. CBG: Recent Labs  Lab 08/26/19 1650 08/26/19 2124 08/27/19 0740 08/27/19 1145 08/27/19 1631  GLUCAP 124* 106* 93 100* 122*   Lipid Profile: No results for input(s): CHOL, HDL, LDLCALC, TRIG, CHOLHDL, LDLDIRECT in the last 72 hours. Thyroid Function Tests: No results for input(s): TSH, T4TOTAL, FREET4, T3FREE, THYROIDAB in the last 72 hours. Anemia Panel: No results for input(s): VITAMINB12, FOLATE, FERRITIN, TIBC, IRON, RETICCTPCT in the last 72 hours. Urine analysis:    Component Value Date/Time   COLORURINE YELLOW 07/25/2019 1805   APPEARANCEUR HAZY (A) 07/25/2019 1805   LABSPEC 1.011 07/25/2019 1805   PHURINE 6.0 07/25/2019 1805   GLUCOSEU NEGATIVE 07/25/2019 1805   HGBUR SMALL (A) 07/25/2019 1805   BILIRUBINUR NEGATIVE 07/25/2019 1805   BILIRUBINUR negative 04/10/2016 1702   KETONESUR NEGATIVE 07/25/2019 1805   PROTEINUR 30 (A) 07/25/2019 1805   UROBILINOGEN 0.2 04/10/2016 1702   UROBILINOGEN  0.2 12/04/2013 1044   NITRITE NEGATIVE 07/25/2019 1805   LEUKOCYTESUR SMALL (A) 07/25/2019 1805   No results found for this or any previous visit (from the past 240 hour(s)).    Radiology Studies: No results found.  Scheduled Meds: . acetaminophen  1,000 mg Oral Q8H  . benztropine  1 mg Oral Daily  . buPROPion  300 mg Oral Daily  . Chlorhexidine Gluconate Cloth  6 each Topical Daily  . cyclobenzaprine  5 mg Oral TID  . diltiazem  240 mg Oral Daily  . enoxaparin (LOVENOX) injection  40 mg Subcutaneous Daily  . folic acid  1 mg Oral Daily  . gabapentin  300 mg Oral BID  . ibuprofen  400 mg Oral TID  . insulin aspart  0-5 Units Subcutaneous QHS  . insulin aspart  0-9 Units Subcutaneous TID WC  . lidocaine  1 patch Transdermal Q24H  . losartan  50 mg Oral Daily  . multivitamin with minerals  1 tablet Oral Daily  . nicotine  21 mg Transdermal Daily  . oxyCODONE  10 mg Oral Q12H  . paliperidone  234 mg  Intramuscular Q30 days  . pantoprazole  40 mg Oral Daily  . polyethylene glycol  17 g Oral BID  . thiamine  250 mg Oral Daily  . vitamin B-12  2,000 mcg Oral Daily   Continuous Infusions: . sodium chloride Stopped (07/28/19 1838)  . cefTRIAXone (ROCEPHIN)  IV 2 g (08/27/19 1128)  . vancomycin 1,500 mg (08/27/19 1203)     LOS: 32 days   Time spent: 25 minutes.  Tyrone Nine, MD [facility] [email or web address] 08/27/2019, 5:49 PM

## 2019-08-28 LAB — GLUCOSE, CAPILLARY
Glucose-Capillary: 89 mg/dL (ref 70–99)
Glucose-Capillary: 96 mg/dL (ref 70–99)
Glucose-Capillary: 111 mg/dL — ABNORMAL HIGH (ref 70–99)
Glucose-Capillary: 112 mg/dL — ABNORMAL HIGH (ref 70–99)

## 2019-08-28 NOTE — Progress Notes (Signed)
Physical Therapy Treatment Patient Details Name: [name] MRN: [id number] DOB: 03-09-1966 Today's Date: 08/28/2019    History of Present Illness 54 yo male admitted with toxic metabolic encephalopathy, bacterial meningitis, unwitness fall 5/2. Hx of recent lumbar sg L3-L5 03/2019, polysubstance abuse, schizophrenia, ETOH abuse, CVA, bipolar d/o, back pain, L ankle fusion 2019, R TKA 2018, asthma.    PT Comments       Follow Up Recommendations  Home health PT;Supervision/Assistance - 24 hour(pt/family decline placement)     Equipment Recommendations  None recommended by PT    Recommendations for Other Services       Precautions / Restrictions Precautions Precautions: Fall Precaution Comments: "bad" L hip Required Braces or Orthoses: Spinal Brace Spinal Brace: Thoracolumbosacral orthotic;Applied in sitting position Restrictions Weight Bearing Restrictions: No    Mobility  Bed Mobility Overal bed mobility: Needs Assistance Bed Mobility: Supine to Sit     Supine to sit: Supervision Sit to supine: Supervision   General bed mobility comments: increased time  Transfers Overall transfer level: Needs assistance Equipment used: Rolling walker (2 wheeled) Transfers: Sit to/from Stand   Stand pivot transfers: Min guard       General transfer comment: for safety. cues for safety, hand placement  Ambulation/Gait Ambulation/Gait assistance: Min guard Gait Distance (Feet): 500 Feet Assistive device: Rolling walker (2 wheeled) Gait Pattern/deviations: Step-through pattern;Decreased stride length     General Gait Details: moves quickly. cues for safety.   Stairs             Wheelchair Mobility    Modified Rankin (Stroke Patients Only)       Balance           Standing balance support: Bilateral upper extremity supported Standing balance-[name]: Poor                              Cognition Arousal/Alertness:  Awake/alert Behavior During Therapy: WFL for tasks assessed/performed Overall Cognitive Status: Within Functional Limits for tasks assessed                                        Exercises      General Comments        Pertinent Vitals/Pain Pain Assessment: 0-10 Pain Score: 8  Pain Location: back Pain Descriptors / Indicators: Dull;Sore Pain Intervention(s): Monitored during session;Premedicated before session    Home Living                      Prior Function            PT Goals (current goals can now be found in the care plan section) Progress towards PT goals: Progressing toward goals    Frequency    Min 3X/week      PT Plan Current plan remains appropriate    Co-evaluation              AM-PAC PT "6 Clicks" Mobility   Outcome Measure  Help needed turning from your back to your side while in a flat bed without using bedrails?: None Help needed moving from lying on your back to sitting on the side of a flat bed without using bedrails?: A Little Help needed moving to and from a bed to a chair (including a wheelchair)?: A Little Help needed standing up from a chair using your  arms (e.g., wheelchair or bedside chair)?: A Little Help needed to walk in hospital room?: A Little Help needed climbing 3-5 steps with a railing? : A Little 6 Click Score: 19    End of Session Equipment Utilized During Treatment: Gait belt;Back brace Activity Tolerance: Patient tolerated treatment well Patient left: in bed;with call bell/phone within reach;with bed alarm set   PT Visit Diagnosis: History of falling (Z91.81);Difficulty in walking, not elsewhere classified (R26.2);Pain;Muscle weakness (generalized) (M62.81)     Time: 6578-4696 PT Time Calculation (min) (ACUTE ONLY): 27 min  Charges:  $Gait Training: 23-37 mins                         Faye Ramsay, PT Acute Rehabilitation

## 2019-08-28 NOTE — Progress Notes (Signed)
PROGRESS NOTE  [name]  ZOX:[id number] DOB: 1965-11-10 DOA: 07/25/2019 PCP: [name], MD   Brief Narrative: [name] is a 54 y.o. male with a history of lumbar laminectomy/fusion, polysubstance abuse, schizophrenia, CVA, asthma, and GERD who was brought to the ED 4/27 by his girlfriend due to lethargy. He was tachycardic and appeared dehydrated, reporting taking many substances the prior evening. He was initially admitted to the ICU for toxic encephalopathy, possible EtOH withdrawal on precedex and empirically treated for meningitis with resolution of fever.Further work-up with CT of the lumbar spine showed osteomyelitis/discitis with epidural phlegmon not amenable to drainage.Infectious disease recommended 6 weeks of intravenous antibiotic therapy with ceftriaxone and vancomycin (final day 6/8).  Patient is not a candidate for outpatient antibiotic therapy due to his history of polysubstance abuse.  Assessment & Plan: Principal Problem:   Acute osteomyelitis of lumbar spine (HCC) Active Problems:   Nicotine dependence, cigarettes, uncomplicated   Toxic metabolic encephalopathy   Polysubstance overdose   Acute kidney injury (HCC)   Chronic pain syndrome   SIRS (systemic inflammatory response syndrome) (HCC)   Delirium   Altered mental status  Acute lumbar and thoracic spine osteomyelitis/discitis. - Continue ceftriaxone, vancomycin through 6/8 per ID recommendations. Not candidate for outpatient IV antibiotics due to polysubstance abuse, lack of social supports; has not been able to be placed by TOC. No evidence of complications at this time, urged to continue ambulation/OOB as much as tolerable. - Vanc level satisfactory. Appreciate pharmacy dosing. - Continue pain control as ordered, multimodal, including: 1. Acetaminophen 1000 mg q8h 2. Gabapentin 300 mg BID 3. Ibuprofen 400 mg TID 4. Lidocaine patch 5. Percocet 7.5-325 mg 1-2 tabs q4h prn. Suspect opioid  tolerance given duration of Tx. 6. Oxycodone ER 10 mg BID 7. Flexeril prn muscle spasms.  - Continue with bowel regimen - OOB, Continue regular PT/OT - Orthopedics, Dr. Otelia Sergeant, has been made aware of lumbar and thoracic MRI findings and per pt has seen him while in the hospital.  Polysubstance and tobacco use: PCP, heroin, cocaine, EtOH binge drinking reported prior to admission. - No longer showing evidence of intoxication or withdrawal.  - Cessation counseling provided.  - Continue empiric thiamine.  - Continue nicotine patch.  Schizophrenia, depression: Quiescent currently.  - Continuebupropion, benztropine, monthly invega (given 5/13) - Follow up with ACT team at discharge per psychiatry.  Acute toxic encephalopathy: Resolved.  HTN: Well controlled - Continue diltiazem, losartan. Renal function stable, wnl.  T2DM: Well controlled with HbA1c 6.6%.  - Continue SSI for tight control with systemic infection.   GERD:  - Continue PPI  Vitamin B12 deficiency:  - Continue supplementation.  Insomnia:  - Continue trazodone qHS  DVT prophylaxis: Lovenox Code Status: Full Family Communication: None at bedside, called and spoke with SO last 5/29 Disposition Plan: Status is: Inpatient  Remains inpatient appropriate because:Unsafe d/c plan  Dispo: The patient is from: Home              Anticipated d/c is to: Home              Anticipated d/c date is: 09/05/2019              Patient currently is not medically stable to d/c.  Consultants:   PCCM primary on 4/29  Neurology 5/3  ID 5/7  Psychiatry 5/11  Procedures:   Lumbar puncture  PICC placement  Antimicrobials:  Vancomycin, ceftriaxone through 6/8   Subjective: No new complaints, he's up  with PT this afternoon. Slept a bit better.  Objective: Vitals:   08/27/19 2051 08/28/19 0512 08/28/19 0952 08/28/19 1248  BP: (!) 133/95 117/90 (!) 121/93 (!) 130/95  Pulse: 88 97 (!) 108 (!) 109  Resp: 18 18  18    Temp: 97.7 F (36.5 C) 98.2 F (36.8 C)  97.9 F (36.6 C)  TempSrc: Oral Oral  Oral  SpO2: 100% 95%  98%  Weight:      Height:        Intake/Output Summary (Last 24 hours) at 08/28/2019 1421 Last data filed at 08/27/2019 1844 Gross per 24 hour  Intake 600 ml  Output --  Net 600 ml   Filed Weights   07/26/19 1858 08/10/19 1612  Weight: 94.3 kg 88.2 kg   Gen: 54 y.o. male in no distress Pulm: Nonlabored breathing room air. Clear. CV: Regular rate and rhythm. No murmur, rub, or gallop. No JVD, no dependent edema. GI: Abdomen soft, non-tender, non-distended, with normoactive bowel sounds.  Ext: Warm, no deformities Skin: No rashes, lesions or ulcers on visualized skin. Neuro: Alert and oriented. No focal neurological deficits. Psych: Judgement and insight appear fair. Mood euthymic & affect congruent. Behavior is appropriate.    Data Reviewed: I have personally reviewed following labs and imaging studies  CBC: Recent Labs  Lab 08/23/19 1148  WBC 8.4  NEUTROABS 6.5  HGB 10.4*  HCT 34.7*  MCV 83.6  PLT 379   Basic Metabolic Panel: Recent Labs  Lab 08/22/19 0440 08/23/19 0944 08/23/19 1148  NA  --   --  136  K  --   --  4.5  CL  --   --  101  CO2  --   --  25  GLUCOSE  --   --  109*  BUN  --   --  13  CREATININE 0.73 0.71 0.63  CALCIUM  --   --  8.9   GFR: Estimated Creatinine Clearance: 118.1 mL/min (by C-G formula based on SCr of 0.63 mg/dL). Liver Function Tests: No results for input(s): AST, ALT, ALKPHOS, BILITOT, PROT, ALBUMIN in the last 168 hours. No results for input(s): LIPASE, AMYLASE in the last 168 hours. No results for input(s): AMMONIA in the last 168 hours. Coagulation Profile: No results for input(s): INR, PROTIME in the last 168 hours. Cardiac Enzymes: No results for input(s): CKTOTAL, CKMB, CKMBINDEX, TROPONINI in the last 168 hours. BNP (last 3 results) No results for input(s): PROBNP in the last 8760 hours. HbA1C: No results for  input(s): HGBA1C in the last 72 hours. CBG: Recent Labs  Lab 08/27/19 1145 08/27/19 1631 08/27/19 2054 08/28/19 0749 08/28/19 1125  GLUCAP 100* 122* 97 89 96   Lipid Profile: No results for input(s): CHOL, HDL, LDLCALC, TRIG, CHOLHDL, LDLDIRECT in the last 72 hours. Thyroid Function Tests: No results for input(s): TSH, T4TOTAL, FREET4, T3FREE, THYROIDAB in the last 72 hours. Anemia Panel: No results for input(s): VITAMINB12, FOLATE, FERRITIN, TIBC, IRON, RETICCTPCT in the last 72 hours. Urine analysis:    Component Value Date/Time   COLORURINE YELLOW 07/25/2019 1805   APPEARANCEUR HAZY (A) 07/25/2019 1805   LABSPEC 1.011 07/25/2019 1805   PHURINE 6.0 07/25/2019 1805   GLUCOSEU NEGATIVE 07/25/2019 1805   HGBUR SMALL (A) 07/25/2019 1805   BILIRUBINUR NEGATIVE 07/25/2019 1805   BILIRUBINUR negative 04/10/2016 1702   KETONESUR NEGATIVE 07/25/2019 1805   PROTEINUR 30 (A) 07/25/2019 1805   UROBILINOGEN 0.2 04/10/2016 1702   UROBILINOGEN 0.2 12/04/2013 1044  NITRITE NEGATIVE 07/25/2019 1805   LEUKOCYTESUR SMALL (A) 07/25/2019 1805   No results found for this or any previous visit (from the past 240 hour(s)).    Radiology Studies: No results found.  Scheduled Meds: . acetaminophen  1,000 mg Oral Q8H  . benztropine  1 mg Oral Daily  . buPROPion  300 mg Oral Daily  . Chlorhexidine Gluconate Cloth  6 each Topical Daily  . cyclobenzaprine  5 mg Oral TID  . diltiazem  240 mg Oral Daily  . enoxaparin (LOVENOX) injection  40 mg Subcutaneous Daily  . folic acid  1 mg Oral Daily  . gabapentin  300 mg Oral BID  . ibuprofen  400 mg Oral TID  . insulin aspart  0-5 Units Subcutaneous QHS  . insulin aspart  0-9 Units Subcutaneous TID WC  . lidocaine  1 patch Transdermal Q24H  . losartan  50 mg Oral Daily  . multivitamin with minerals  1 tablet Oral Daily  . nicotine  21 mg Transdermal Daily  . oxyCODONE  10 mg Oral Q12H  . paliperidone  234 mg Intramuscular Q30 days  .  pantoprazole  40 mg Oral Daily  . polyethylene glycol  17 g Oral BID  . thiamine  250 mg Oral Daily  . traZODone  50 mg Oral QHS  . vitamin B-12  2,000 mcg Oral Daily   Continuous Infusions: . sodium chloride Stopped (07/28/19 1838)  . cefTRIAXone (ROCEPHIN)  IV 2 g (08/28/19 1019)  . vancomycin 1,500 mg (08/28/19 1306)     LOS: 33 days   Time spent: 15 minutes.  Tyrone Nine, MD [facility] [email or web address] 08/28/2019, 2:21 PM

## 2019-08-28 NOTE — Plan of Care (Signed)
  Problem: Education: Goal: Knowledge of General Education information will improve Description: Including pain rating scale, medication(s)/side effects and non-pharmacologic comfort measures Outcome: Progressing   Problem: Health Behavior/Discharge Planning: Goal: Ability to manage health-related needs will improve Outcome: Progressing   Problem: Clinical Measurements: Goal: Ability to maintain clinical measurements within normal limits will improve Outcome: Progressing Goal: Will remain free from infection Outcome: Progressing Goal: Diagnostic test results will improve Outcome: Progressing Goal: Respiratory complications will improve Outcome: Progressing Goal: Cardiovascular complication will be avoided Outcome: Progressing   Problem: Activity: Goal: Risk for activity intolerance will decrease Outcome: Progressing   Problem: Coping: Goal: Level of anxiety will decrease Outcome: Progressing   Problem: Elimination: Goal: Will not experience complications related to bowel motility Outcome: Progressing   Problem: Pain Managment: Goal: General experience of comfort will improve Outcome: Progressing   Problem: Safety: Goal: Ability to remain free from injury will improve Outcome: Progressing   

## 2019-08-29 LAB — GLUCOSE, CAPILLARY
Glucose-Capillary: 89 mg/dL (ref 70–99)
Glucose-Capillary: 123 mg/dL — ABNORMAL HIGH (ref 70–99)
Glucose-Capillary: 93 mg/dL (ref 70–99)
Glucose-Capillary: 91 mg/dL (ref 70–99)

## 2019-08-29 MED ORDER — ACETAMINOPHEN 325 MG PO TABS
650.0000 mg | ORAL_TABLET | Freq: Three times a day (TID) | ORAL | Status: DC | PRN
  Filled 2019-08-29: qty 2

## 2019-08-29 NOTE — Progress Notes (Signed)
Pt with extended hospital stay for IV Antibiotics. Pt today with c/o very dry skin and itching. Pt per protocol for PICC line has CHG wipes ordered. Pt states "Every time they use those things I itch and scratch myself" MD making rounds and aware and order given to discontinue CHG wipes. Lotion applied to Pt's skin.

## 2019-08-29 NOTE — Progress Notes (Signed)
PROGRESS NOTE  [name]  AOZ:[id number] DOB: 27-Jan-1966 DOA: 07/25/2019 PCP: [name], MD   Brief Narrative: [name] is a 54 y.o. male with a history of lumbar laminectomy/fusion, polysubstance abuse, schizophrenia, CVA, asthma, and GERD who was brought to the ED 4/27 by his girlfriend due to lethargy. He was tachycardic and appeared dehydrated, reporting taking many substances the prior evening. He was initially admitted to the ICU for toxic encephalopathy, possible EtOH withdrawal on precedex and empirically treated for meningitis with resolution of fever.Further work-up with CT of the lumbar spine showed osteomyelitis/discitis with epidural phlegmon not amenable to drainage.Infectious disease recommended 6 weeks of intravenous antibiotic therapy with ceftriaxone and vancomycin (final day 6/8).  Patient is not a candidate for outpatient antibiotic therapy due to his history of polysubstance abuse.  Assessment & Plan: Principal Problem:   Acute osteomyelitis of lumbar spine (HCC) Active Problems:   Nicotine dependence, cigarettes, uncomplicated   Toxic metabolic encephalopathy   Polysubstance overdose   Acute kidney injury (HCC)   Chronic pain syndrome   SIRS (systemic inflammatory response syndrome) (HCC)   Delirium   Altered mental status  Acute lumbar and thoracic spine osteomyelitis/discitis. - Continue ceftriaxone, vancomycin through 6/8 per ID recommendations. Not candidate for outpatient IV antibiotics due to polysubstance abuse, lack of social supports; has not been able to be placed by TOC. No evidence of complications at this time, urged to continue ambulation/OOB as much as tolerable. - Vanc level to be drawn again tomorrow, will check BMP, CBC as well. - Continue pain control as ordered, multimodal, including: 1. Acetaminophen 1000 mg q8h 2. Gabapentin 300 mg BID 3. Ibuprofen 400 mg TID 4. Lidocaine patch 5. Percocet 7.5-325 mg 1-2 tabs q4h prn.  Suspect opioid tolerance given duration of Tx. 6. Oxycodone ER 10 mg BID 7. Flexeril prn muscle spasms.  - Continue with bowel regimen - OOB, Continue regular PT/OT - Orthopedics, Dr. Otelia Sergeant, has been made aware of lumbar and thoracic MRI findings and per pt has seen him while in the hospital.  Polysubstance and tobacco use: PCP, heroin, cocaine, EtOH binge drinking reported prior to admission. - No longer showing evidence of intoxication or withdrawal.  - Cessation counseling provided.  - Continue empiric thiamine.  - Continue nicotine patch.  Schizophrenia, depression: Quiescent currently.  - Continuebupropion, benztropine, monthly invega (given 5/13) - Follow up with ACT team at discharge per psychiatry.  Acute toxic encephalopathy: Resolved.  HTN: Well controlled - Continue diltiazem, losartan. Renal function stable, wnl.  T2DM: Well controlled with HbA1c 6.6%.  - Continue SSI for tight control with systemic infection.   GERD:  - Continue PPI  Vitamin B12 deficiency:  - Continue supplementation.  Insomnia:  - Continue trazodone qHS  DVT prophylaxis: Lovenox Code Status: Full Family Communication: None at bedside, called and spoke with SO last 5/29 Disposition Plan: Status is: Inpatient  Remains inpatient appropriate because:Unsafe d/c plan  Dispo: The patient is from: Home              Anticipated d/c is to: Home              Anticipated d/c date is: 09/05/2019              Patient currently is not medically stable to d/c.  Consultants:   PCCM primary on 4/29  Neurology 5/3  ID 5/7  Psychiatry 5/11  Procedures:   Lumbar puncture  PICC placement  Antimicrobials:  Vancomycin, ceftriaxone through 6/8  Subjective: Itching all over and has been after CHG daily. No fever or new complaints.  Objective: Vitals:   08/28/19 1248 08/28/19 2042 08/29/19 0440 08/29/19 0907  BP: (!) 130/95 128/90 134/90 135/88  Pulse: (!) 109 (!) 105 100   Resp: 18 20  18    Temp: 97.9 F (36.6 C) 98.9 F (37.2 C) 98.7 F (37.1 C)   TempSrc: Oral Oral Oral   SpO2: 98% 96% 97%   Weight:      Height:        Intake/Output Summary (Last 24 hours) at 08/29/2019 1312 Last data filed at 08/29/2019 0600 Gross per 24 hour  Intake 1025.41 ml  Output 2250 ml  Net -1224.59 ml   Filed Weights   07/26/19 1858 08/10/19 1612  Weight: 94.3 kg 88.2 kg   Gen: 54 y.o. male in no distress Pulm: Nonlabored breathing room air. Clear. CV: Regular rate and rhythm. No murmur, rub, or gallop. No JVD, no dependent edema. GI: Abdomen soft, non-tender, non-distended, with normoactive bowel sounds.  Ext: Warm, no deformities Skin: Diffusely dry skin. Neuro: Alert and oriented. No focal neurological deficits. Psych: Judgement and insight appear fair. Mood euthymic & affect congruent. Behavior is appropriate.    Data Reviewed: I have personally reviewed following labs and imaging studies  CBC: Recent Labs  Lab 08/23/19 1148  WBC 8.4  NEUTROABS 6.5  HGB 10.4*  HCT 34.7*  MCV 83.6  PLT 379   Basic Metabolic Panel: Recent Labs  Lab 08/23/19 0944 08/23/19 1148  NA  --  136  K  --  4.5  CL  --  101  CO2  --  25  GLUCOSE  --  109*  BUN  --  13  CREATININE 0.71 0.63  CALCIUM  --  8.9   GFR: Estimated Creatinine Clearance: 118.1 mL/min (by C-G formula based on SCr of 0.63 mg/dL). Liver Function Tests: No results for input(s): AST, ALT, ALKPHOS, BILITOT, PROT, ALBUMIN in the last 168 hours. No results for input(s): LIPASE, AMYLASE in the last 168 hours. No results for input(s): AMMONIA in the last 168 hours. Coagulation Profile: No results for input(s): INR, PROTIME in the last 168 hours. Cardiac Enzymes: No results for input(s): CKTOTAL, CKMB, CKMBINDEX, TROPONINI in the last 168 hours. BNP (last 3 results) No results for input(s): PROBNP in the last 8760 hours. HbA1C: No results for input(s): HGBA1C in the last 72 hours. CBG: Recent Labs  Lab  08/28/19 1125 08/28/19 1652 08/28/19 2034 08/29/19 0727 08/29/19 1049  GLUCAP 96 111* 112* 89 123*   Lipid Profile: No results for input(s): CHOL, HDL, LDLCALC, TRIG, CHOLHDL, LDLDIRECT in the last 72 hours. Thyroid Function Tests: No results for input(s): TSH, T4TOTAL, FREET4, T3FREE, THYROIDAB in the last 72 hours. Anemia Panel: No results for input(s): VITAMINB12, FOLATE, FERRITIN, TIBC, IRON, RETICCTPCT in the last 72 hours. Urine analysis:    Component Value Date/Time   COLORURINE YELLOW 07/25/2019 1805   APPEARANCEUR HAZY (A) 07/25/2019 1805   LABSPEC 1.011 07/25/2019 1805   PHURINE 6.0 07/25/2019 1805   GLUCOSEU NEGATIVE 07/25/2019 1805   HGBUR SMALL (A) 07/25/2019 1805   BILIRUBINUR NEGATIVE 07/25/2019 1805   BILIRUBINUR negative 04/10/2016 1702   KETONESUR NEGATIVE 07/25/2019 1805   PROTEINUR 30 (A) 07/25/2019 1805   UROBILINOGEN 0.2 04/10/2016 1702   UROBILINOGEN 0.2 12/04/2013 1044   NITRITE NEGATIVE 07/25/2019 1805   LEUKOCYTESUR SMALL (A) 07/25/2019 1805   No results found for this or any previous visit (from  the past 240 hour(s)).    Radiology Studies: No results found.  Scheduled Meds: . acetaminophen  1,000 mg Oral Q8H  . benztropine  1 mg Oral Daily  . buPROPion  300 mg Oral Daily  . cyclobenzaprine  5 mg Oral TID  . diltiazem  240 mg Oral Daily  . enoxaparin (LOVENOX) injection  40 mg Subcutaneous Daily  . folic acid  1 mg Oral Daily  . gabapentin  300 mg Oral BID  . ibuprofen  400 mg Oral TID  . insulin aspart  0-5 Units Subcutaneous QHS  . insulin aspart  0-9 Units Subcutaneous TID WC  . lidocaine  1 patch Transdermal Q24H  . losartan  50 mg Oral Daily  . multivitamin with minerals  1 tablet Oral Daily  . nicotine  21 mg Transdermal Daily  . oxyCODONE  10 mg Oral Q12H  . paliperidone  234 mg Intramuscular Q30 days  . pantoprazole  40 mg Oral Daily  . polyethylene glycol  17 g Oral BID  . thiamine  250 mg Oral Daily  . traZODone  50 mg Oral  QHS  . vitamin B-12  2,000 mcg Oral Daily   Continuous Infusions: . sodium chloride Stopped (07/28/19 1838)  . cefTRIAXone (ROCEPHIN)  IV 2 g (08/29/19 0913)  . vancomycin 1,500 mg (08/29/19 0952)     LOS: 34 days   Time spent: 15 minutes.  Tyrone Nine, MD [facility] [email or web address] 08/29/2019, 1:12 PM

## 2019-08-30 LAB — CBC
HCT: 33.4 % — ABNORMAL LOW (ref 39.0–52.0)
Hemoglobin: 10.1 g/dL — ABNORMAL LOW (ref 13.0–17.0)
MCH: 25.6 pg — ABNORMAL LOW (ref 26.0–34.0)
MCHC: 30.2 g/dL (ref 30.0–36.0)
MCV: 84.8 fL (ref 80.0–100.0)
Platelets: 327 10*3/uL (ref 150–400)
RBC: 3.94 MIL/uL — ABNORMAL LOW (ref 4.22–5.81)
RDW: 20.8 % — ABNORMAL HIGH (ref 11.5–15.5)
WBC: 6.8 10*3/uL (ref 4.0–10.5)
nRBC: 0 % (ref 0.0–0.2)

## 2019-08-30 LAB — GLUCOSE, CAPILLARY
Glucose-Capillary: 83 mg/dL (ref 70–99)
Glucose-Capillary: 98 mg/dL (ref 70–99)
Glucose-Capillary: 124 mg/dL — ABNORMAL HIGH (ref 70–99)
Glucose-Capillary: 113 mg/dL — ABNORMAL HIGH (ref 70–99)

## 2019-08-30 LAB — BASIC METABOLIC PANEL
Anion gap: 11 (ref 5–15)
BUN: 20 mg/dL (ref 6–20)
CO2: 24 mmol/L (ref 22–32)
Calcium: 8.8 mg/dL — ABNORMAL LOW (ref 8.9–10.3)
Chloride: 102 mmol/L (ref 98–111)
Creatinine, Ser: 0.89 mg/dL (ref 0.61–1.24)
GFR calc Af Amer: >60 mL/min (ref >60)
GFR calc non Af Amer: >60 mL/min (ref >60)
Glucose, Bld: 92 mg/dL (ref 70–99)
Potassium: 4.5 mmol/L (ref 3.5–5.1)
Sodium: 137 mmol/L (ref 135–145)

## 2019-08-30 LAB — VANCOMYCIN, TROUGH: Vancomycin Tr: 23 ug/mL (ref 15–20)

## 2019-08-30 MED ORDER — METHOCARBAMOL 500 MG PO TABS
500.0000 mg | ORAL_TABLET | Freq: Three times a day (TID) | ORAL | Status: DC | PRN
  Administered 2019-08-31 – 2019-09-02 (×5): 500 mg via ORAL
  Filled 2019-08-30 (×5): qty 1

## 2019-08-30 MED ORDER — VANCOMYCIN HCL IN DEXTROSE 1-5 GM/200ML-% IV SOLN
1000.0000 mg | Freq: Two times a day (BID) | INTRAVENOUS | Status: DC
  Administered 2019-08-30 – 2019-09-05 (×13): 1000 mg via INTRAVENOUS
  Filled 2019-08-30 (×13): qty 200

## 2019-08-30 NOTE — Progress Notes (Signed)
OT Cancellation Note  Patient Details Name: [name] MRN: [id number] DOB: 26-Aug-1965   Cancelled Treatment:    Reason Eval/Treat Not Completed: Patient declined, no reason specified. Attempted to see patient this morning, however upon arrival patient had just received pain medication and his breakfast. Declined getting up to chair for breakfast. Will re-attempt as schedule allows.  Delbert Phenix OT Pager: [phone] 08/30/2019, 2:55 PM

## 2019-08-30 NOTE — Progress Notes (Signed)
PROGRESS NOTE  [name]  ZOX:[id number] DOB: 21-Aug-1965 DOA: 07/25/2019 PCP: [name], MD   Brief Narrative:  54 y.o. male with a history of lumbar laminectomy/fusion, polysubstance abuse, schizophrenia, CVA, asthma, and GERD who was brought to the ED 07/25/2019 by his girlfriend due to lethargy. He was tachycardic and appeared dehydrated, reporting taking many substances the prior evening. He was initially admitted to the ICU for toxic encephalopathy, possible EtOH withdrawal on precedex and empirically treated for meningitis with resolution of fever.Further work-up with CT of the lumbar spine showed osteomyelitis/discitis with epidural phlegmon not amenable to drainage.Infectious disease recommended 6 weeks of intravenous antibiotic therapy with ceftriaxone and vancomycin (final day 09/05/2019).  Patient is not a candidate for outpatient antibiotic therapy due to his history of polysubstance abuse.  Assessment & Plan:  Acute lumbar and thoracic spine osteomyelitis/discitis. - Continue ceftriaxone, vancomycin through 6/8 per ID recommendations. Not candidate for outpatient IV antibiotics due to polysubstance abuse, lack of social supports; has not been able to be placed by TOC. No evidence of complications at this time, urged to continue ambulation/OOB as much as tolerable. - Continue pain control along with bowel regimen.  Switch Flexeril to Robaxin as needed spasms - OOB, Continue regular PT/OT  Polysubstance and tobacco use: PCP, heroin, cocaine, alcohol binge drinking reported prior to admission. - No longer showing evidence of intoxication or withdrawal.  - Cessation counseling provided.  - Continue empiric thiamine.  - Continue nicotine patch.  Schizophrenia, depression: Quiescent currently.  - Continuebupropion, benztropine, monthly invega (given 08/10/2019) - Follow up with ACT team at discharge per psychiatry.  Acute toxic encephalopathy: Resolved.  HTN: Well  controlled - Continue diltiazem, losartan.   Diabetes mellitus type 2: Well controlled with HbA1c 6.6%.  - Continue SSI for tight control with systemic infection.   GERD:  - Continue PPI  Vitamin B12 deficiency:  - Continue supplementation.  Insomnia:  - Continue trazodone qHS  DVT prophylaxis: Lovenox Code Status: Full Family Communication: None at bedside Disposition Plan: Status is: Inpatient  Remains inpatient appropriate because:Unsafe d/c plan  Dispo: The patient is from: Home              Anticipated d/c is to: Home              Anticipated d/c date is: 09/05/2019              Patient currently is not medically stable to d/c.  Consultants:   PCCM primary on 4/29  Neurology 5/3  ID 5/7  Psychiatry 5/11  Procedures:   Lumbar puncture  PICC placement  Antimicrobials:  Vancomycin, ceftriaxone through 6/8   Subjective: Patient seen and examined at bedside.  Poor historian.  Complains of spasm in the right lower extremity.  No overnight fever or vomiting noted.  Objective: Vitals:   08/29/19 1500 08/29/19 2103 08/30/19 0456 08/30/19 1300  BP:  125/89 109/86   Pulse: 98 (!) 103 95   Resp:  18 20   Temp:  97.7 F (36.5 C) 98.9 F (37.2 C)   TempSrc:  Oral Oral   SpO2:  93% 95%   Weight:    89.8 kg  Height:        Intake/Output Summary (Last 24 hours) at 08/30/2019 1335 Last data filed at 08/30/2019 0501 Gross per 24 hour  Intake 920 ml  Output 2301 ml  Net -1381 ml   Filed Weights   07/26/19 1858 08/10/19 1612 08/30/19 1300  Weight: 94.3 kg  88.2 kg 89.8 kg   Gen: No acute distress.  Poor historian.  Looks chronically ill Pulm: Bilateral decreased breath sounds at bases.   CV: S1-S2 positive, rate controlled. GI: Soft, nontender, nondistended.  Bowel sounds are heard Ext: No cyanosis, clubbing or edema.    Data Reviewed: I have personally reviewed following labs and imaging studies  CBC: Recent Labs  Lab 08/30/19 0407  WBC 6.8  HGB  10.1*  HCT 33.4*  MCV 84.8  PLT 327   Basic Metabolic Panel: Recent Labs  Lab 08/30/19 0407  NA 137  K 4.5  CL 102  CO2 24  GLUCOSE 92  BUN 20  CREATININE 0.89  CALCIUM 8.8*   GFR: Estimated Creatinine Clearance: 107 mL/min (by C-G formula based on SCr of 0.89 mg/dL). Liver Function Tests: No results for input(s): AST, ALT, ALKPHOS, BILITOT, PROT, ALBUMIN in the last 168 hours. No results for input(s): LIPASE, AMYLASE in the last 168 hours. No results for input(s): AMMONIA in the last 168 hours. Coagulation Profile: No results for input(s): INR, PROTIME in the last 168 hours. Cardiac Enzymes: No results for input(s): CKTOTAL, CKMB, CKMBINDEX, TROPONINI in the last 168 hours. BNP (last 3 results) No results for input(s): PROBNP in the last 8760 hours. HbA1C: No results for input(s): HGBA1C in the last 72 hours. CBG: Recent Labs  Lab 08/29/19 1049 08/29/19 1541 08/29/19 2101 08/30/19 0735 08/30/19 1152  GLUCAP 123* 93 91 83 98   Lipid Profile: No results for input(s): CHOL, HDL, LDLCALC, TRIG, CHOLHDL, LDLDIRECT in the last 72 hours. Thyroid Function Tests: No results for input(s): TSH, T4TOTAL, FREET4, T3FREE, THYROIDAB in the last 72 hours. Anemia Panel: No results for input(s): VITAMINB12, FOLATE, FERRITIN, TIBC, IRON, RETICCTPCT in the last 72 hours. Urine analysis:    Component Value Date/Time   COLORURINE YELLOW 07/25/2019 1805   APPEARANCEUR HAZY (A) 07/25/2019 1805   LABSPEC 1.011 07/25/2019 1805   PHURINE 6.0 07/25/2019 1805   GLUCOSEU NEGATIVE 07/25/2019 1805   HGBUR SMALL (A) 07/25/2019 1805   BILIRUBINUR NEGATIVE 07/25/2019 1805   BILIRUBINUR negative 04/10/2016 1702   KETONESUR NEGATIVE 07/25/2019 1805   PROTEINUR 30 (A) 07/25/2019 1805   UROBILINOGEN 0.2 04/10/2016 1702   UROBILINOGEN 0.2 12/04/2013 1044   NITRITE NEGATIVE 07/25/2019 1805   LEUKOCYTESUR SMALL (A) 07/25/2019 1805   No results found for this or any previous visit (from the  past 240 hour(s)).    Radiology Studies: No results found.  Scheduled Meds: . benztropine  1 mg Oral Daily  . buPROPion  300 mg Oral Daily  . diltiazem  240 mg Oral Daily  . enoxaparin (LOVENOX) injection  40 mg Subcutaneous Daily  . folic acid  1 mg Oral Daily  . gabapentin  300 mg Oral BID  . ibuprofen  400 mg Oral TID  . insulin aspart  0-5 Units Subcutaneous QHS  . insulin aspart  0-9 Units Subcutaneous TID WC  . lidocaine  1 patch Transdermal Q24H  . losartan  50 mg Oral Daily  . multivitamin with minerals  1 tablet Oral Daily  . nicotine  21 mg Transdermal Daily  . oxyCODONE  10 mg Oral Q12H  . paliperidone  234 mg Intramuscular Q30 days  . pantoprazole  40 mg Oral Daily  . polyethylene glycol  17 g Oral BID  . thiamine  250 mg Oral Daily  . traZODone  50 mg Oral QHS  . vitamin B-12  2,000 mcg Oral Daily   Continuous  Infusions: . sodium chloride Stopped (07/28/19 1838)  . cefTRIAXone (ROCEPHIN)  IV 2 g (08/30/19 1002)  . vancomycin 1,000 mg (08/30/19 1259)     LOS: 35 days   Time spent: 15 minutes.  Glade Lloyd, MD [facility] [email or web address] 08/30/2019, 1:35 PM

## 2019-08-30 NOTE — Progress Notes (Signed)
Physical Therapy Treatment Patient Details Name: [name] MRN: [id number] DOB: 1966/03/02 Today's Date: 08/30/2019    History of Present Illness 54 yo male admitted with toxic metabolic encephalopathy, bacterial meningitis, unwitness fall 5/2. Hx of recent lumbar sg L3-L5 03/2019, polysubstance abuse, schizophrenia, ETOH abuse, CVA, bipolar d/o, back pain, L ankle fusion 2019, R TKA 2018, asthma.    PT Comments    Assisted OOB onto scale for a weight with NT.  Applied TLSO in standing vs sitting due to enlarged ABD girth.  Assisted with amb around the unit 1 1/2 times with walker.  Good alternating gait with slight L limp "bad hip".  VC's safety/impulsive/quick turns  Follow Up Recommendations  Home health PT;Supervision/Assistance - 24 hour     Equipment Recommendations  None recommended by PT    Recommendations for Other Services       Precautions / Restrictions Precautions Precautions: Fall Precaution Comments: "bad" L hip Required Braces or Orthoses: Spinal Brace Spinal Brace: Thoracolumbosacral orthotic;Applied in sitting position Restrictions Weight Bearing Restrictions: No Other Position/Activity Restrictions: WBAT    Mobility  Bed Mobility Overal bed mobility: Modified Independent             General bed mobility comments: increased time  Transfers Overall transfer level: Needs assistance Equipment used: Rolling walker (2 wheeled) Transfers: Sit to/from [employer] Sit to Stand: Supervision Stand pivot transfers: Supervision;Min guard       General transfer comment: for safety. cues for safety, hand placement  Ambulation/Gait Ambulation/Gait assistance: Supervision;Min guard Gait Distance (Feet): 750 Feet Assistive device: Rolling walker (2 wheeled) Gait Pattern/deviations: Step-through pattern;Decreased stride length     General Gait Details: moves quickly. cues for safety.   Stairs             Wheelchair Mobility     Modified Rankin (Stroke Patients Only)       Balance                                            Cognition Arousal/Alertness: Awake/alert Behavior During Therapy: WFL for tasks assessed/performed Overall Cognitive Status: Within Functional Limits for tasks assessed                                 General Comments: AxO x 4      Exercises      General Comments        Pertinent Vitals/Pain Pain Assessment: Faces Faces Pain Scale: Hurts a little bit Pain Location: back Pain Descriptors / Indicators: Dull;Sore Pain Intervention(s): Monitored during session    Home Living                      Prior Function            PT Goals (current goals can now be found in the care plan section) Progress towards PT goals: Progressing toward goals    Frequency    Min 3X/week      PT Plan Current plan remains appropriate    Co-evaluation              AM-PAC PT "6 Clicks" Mobility   Outcome Measure  Help needed turning from your back to your side while in a flat bed without using bedrails?: None Help needed moving from lying on  your back to sitting on the side of a flat bed without using bedrails?: None Help needed moving to and from a bed to a chair (including a wheelchair)?: A Little Help needed standing up from a chair using your arms (e.g., wheelchair or bedside chair)?: A Little Help needed to walk in hospital room?: A Little Help needed climbing 3-5 steps with a railing? : A Little 6 Click Score: 20    End of Session Equipment Utilized During Treatment: Gait belt;Back brace Activity Tolerance: Patient tolerated treatment well Patient left: in bed;with call bell/phone within reach;with bed alarm set Nurse Communication: Mobility status PT Visit Diagnosis: History of falling (Z91.81);Difficulty in walking, not elsewhere classified (R26.2);Pain;Muscle weakness (generalized) (M62.81)     Time: 4696-2952 PT Time  Calculation (min) (ACUTE ONLY): 27 min  Charges:  $Gait Training: 8-22 mins $Therapeutic Activity: 8-22 mins                     Felecia Shelling  PTA Acute  Rehabilitation Services Pager      [phone] Office      [phone]

## 2019-08-30 NOTE — Progress Notes (Signed)
Pharmacy Antibiotic Note  [name] is a 54 y.o. male admitted on 07/25/2019 with altered mental status. Patient previously on Acyclovir, Vancomycin, Ampicillin, and Ceftriaxone for meningitis. Antibiotics  for meningitis completed . However,  As of 5/7 plan per ID, to continue vancomycin and rocephin for 6 weeks total - last dose on 6/8 for discitis/osteomyelitis.    Today, 08/30/19 Day 36/42 Vanc/Rocephin  Vanc trough increased to 23 mcg/ml on 1500mg  q12  Trough elevated   Plan:  Reduce Vancomycin to 1000mg  IV q12h  Rocephin 2g IV daily per Md  Height: 5\' 10"  (177.8 cm) Weight: 88.2 kg (194 lb 6.4 oz) IBW/kg (Calculated) : 73  Temp (24hrs), Avg:98.3 F (36.8 C), Min:97.7 F (36.5 C), Max:98.9 F (37.2 C)  Recent Labs  Lab 08/23/19 1148 08/30/19 0407 08/30/19 0847  WBC 8.4 6.8  --   CREATININE 0.63 0.89  --   VANCOTROUGH  --   --  23*    Estimated Creatinine Clearance: 106.2 mL/min (by C-G formula based on SCr of 0.89 mg/dL).    Allergies  Allergen Reactions  . Chlorhexidine Itching and Rash    /28 Ceftriaxone x 1, 4/30 >> 4/28 Doxycycline >> 4/28 4/29 Ampicillin/sulbactam >> 4/30 4/30 Ampicillin>> 5/3 AM, resumed 5/3 PM >> 5/10 4/30 Acyclovir >> 5/7 4/30 Vancomycin >>   Dose adjustments/levels this admission: 5/4 VT = 16 on 1500 q12 5/10 VT = 13, continue 5/19 VT = 15, continue  5/26 VT = 16, continue 6/2 VT = 23, decr to 1000 mg q12  Microbiology results: 4/27 Influenza A/B: neg, Covid: neg 4/27 RPR: nonreactive 4/27 HIV: nonreactive 4/27 BCx: ngF 4/27 UCx: ngF  4/29 MRSA PCR: neg 5/2 CSF: gram stain neg,  ngF 5/2 CSF fungal: No Fungus Isolated After 21 days  5/2 CSF HSV: Neg 5/2 CSF VDRL: neg  Thank you for allowing pharmacy to be a part of this patient's care.  Minda Ditto PharmD 08/30/2019, 11:53 AM

## 2019-08-30 NOTE — Progress Notes (Signed)
Nutrition Follow-up  DOCUMENTATION CODES:   Not applicable  INTERVENTION:  - continue Magic Cup BID. - weigh patient today.   NUTRITION DIAGNOSIS:   Increased nutrient needs related to acute illness as evidenced by estimated needs. -ongoing  GOAL:   Patient will meet greater than or equal to 90% of their needs -met  MONITOR:   PO intake, Supplement acceptance, Labs, Weight trends  ASSESSMENT:   54 year old male with medical history of HTN, prior CVA, GERD, depression/anxiety, chronic pain syndrome, alcohol abuse, and polysubstance abuse including PCP, heroin, and cocaine. He presented to the ED with tachycardia and tachypnea. He was noted to be encephalopathic and required ICU admission for precedex. There was concern for bacterial meningitis and ID recommended 14 days of abx. CT of L-spine showed osteomyelitis/discitis. ID recommended 6 weeks IV abx (to end 6/8) and patient is not a candidate for home PICC due to hx of substance abuse/use.  Patient continues to consume 100% of meals. Patient has not been weighed since 5/13. Plan is for d/c following completion of IV abx course on 6/8.      Labs reviewed; CBGs: 83 and 98 mg/dl. Medications reviewed; 1 mg folvite/day, sliding scale novolog, 1 tablet multivitamin with minerals, 17 g miralax BID, 250 mg thiamine/day, 2000 mcg oral cyanocobalamin/day.    Diet Order:   Diet Order            Diet regular Room service appropriate? Yes; Fluid consistency: Thin  Diet effective now              EDUCATION NEEDS:   Not appropriate for education at this time  Skin:  Skin Assessment: Reviewed RN Assessment  Last BM:  5/31  Height:   Ht Readings from Last 1 Encounters:  07/26/19 '5\' 10"'$  (1.778 m)    Weight:   Wt Readings from Last 1 Encounters:  08/10/19 88.2 kg     Estimated Nutritional Needs:  Kcal:  2200-2400 kcal Protein:  110-125 grams Fluid:  >/= 2.2 L/day     Jarome Matin, MS, RD, LDN, CNSC Inpatient  Clinical Dietitian RD pager # available in [facility]  After hours/weekend pager # available in [facility]

## 2019-08-31 LAB — GLUCOSE, CAPILLARY
Glucose-Capillary: 92 mg/dL (ref 70–99)
Glucose-Capillary: 92 mg/dL (ref 70–99)
Glucose-Capillary: 104 mg/dL — ABNORMAL HIGH (ref 70–99)
Glucose-Capillary: 147 mg/dL — ABNORMAL HIGH (ref 70–99)

## 2019-08-31 NOTE — Plan of Care (Signed)
Plan of care reviewed and discussed with the patient. 

## 2019-08-31 NOTE — Progress Notes (Signed)
PROGRESS NOTE  [name]  RJJ:[id number] DOB: Oct 01, 1965 DOA: 07/25/2019 PCP: [name], MD   Brief Narrative:  54 y.o. male with a history of lumbar laminectomy/fusion, polysubstance abuse, schizophrenia, CVA, asthma, and GERD who was brought to the ED 07/25/2019 by his girlfriend due to lethargy. He was tachycardic and appeared dehydrated, reporting taking many substances the prior evening. He was initially admitted to the ICU for toxic encephalopathy, possible EtOH withdrawal on precedex and empirically treated for meningitis with resolution of fever.Further work-up with CT of the lumbar spine showed osteomyelitis/discitis with epidural phlegmon not amenable to drainage.Infectious disease recommended 6 weeks of intravenous antibiotic therapy with ceftriaxone and vancomycin (final day 09/05/2019).  Patient is not a candidate for outpatient antibiotic therapy due to his history of polysubstance abuse.  Assessment & Plan:  Acute lumbar and thoracic spine osteomyelitis/discitis. - Continue ceftriaxone, vancomycin through 6/8 per ID recommendations. Not candidate for outpatient IV antibiotics due to polysubstance abuse, lack of social supports; has not been able to be placed by TOC. No evidence of complications at this time, urged to continue ambulation/OOB as much as tolerable. - Continue pain control along with bowel regimen.  Continue Robaxin as needed for spasms. - OOB, Continue regular PT/OT -Currently afebrile and hemodynamically stable.  Polysubstance and tobacco use: PCP, heroin, cocaine, alcohol binge drinking reported prior to admission. - No longer showing evidence of intoxication or withdrawal.  - Cessation counseling provided.  - Continue empiric thiamine.  - Continue nicotine patch.  Schizophrenia, depression: Quiescent currently.  - Continuebupropion, benztropine, monthly invega (given 08/10/2019) - Follow up with ACT team at discharge per psychiatry.  Acute toxic  encephalopathy: Resolved.  HTN: Well controlled - Continue diltiazem, losartan.   Diabetes mellitus type 2: Well controlled with HbA1c 6.6%.  - Continue SSI for tight control with systemic infection.   GERD:  - Continue PPI  Vitamin B12 deficiency:  - Continue supplementation.  Insomnia:  - Continue trazodone qHS  DVT prophylaxis: Lovenox Code Status: Full Family Communication: None at bedside Disposition Plan: Status is: Inpatient  Remains inpatient appropriate because:Unsafe d/c plan  Dispo: The patient is from: Home              Anticipated d/c is to: Home              Anticipated d/c date is: 09/05/2019              Patient currently is not medically stable to d/c.  Consultants:   PCCM primary on 4/29  Neurology 5/3  ID 5/7  Psychiatry 5/11  Procedures:   Lumbar puncture  PICC placement  Antimicrobials:  Vancomycin, ceftriaxone through 6/8   Subjective: Patient seen and examined at bedside.  Poor historian.  Sleepy, wakes up slightly.  No overnight fever, vomiting, worsening shortness of breath.  Still complains of some right lower extremity spasm. Objective: Vitals:   08/30/19 1300 08/30/19 1343 08/30/19 2138 08/31/19 0524  BP:  111/86 (!) 126/94 103/76  Pulse:  (!) 108 98 92  Resp:  18 20 20   Temp:  98.4 F (36.9 C) 97.7 F (36.5 C) 98.1 F (36.7 C)  TempSrc:  Oral Oral Oral  SpO2:  98% 99% 98%  Weight: 89.8 kg 90 kg    Height:        Intake/Output Summary (Last 24 hours) at 08/31/2019 0722 Last data filed at 08/31/2019 0100 Gross per 24 hour  Intake 2280 ml  Output 2575 ml  Net -295 ml  Filed Weights   08/10/19 1612 08/30/19 1300 08/30/19 1343  Weight: 88.2 kg 89.8 kg 90 kg   Gen: No distress.  Poor historian.  Looks chronically ill Pulm: Bilateral decreased breath sounds at bases, no wheezing.   CV: S1-S2 positive, intermittently tachycardic GI: Soft, nontender, nondistended.  Bowel sounds are heard Ext: No edema or  clubbing  Data Reviewed: I have personally reviewed following labs and imaging studies  CBC: Recent Labs  Lab 08/30/19 0407  WBC 6.8  HGB 10.1*  HCT 33.4*  MCV 84.8  PLT 327   Basic Metabolic Panel: Recent Labs  Lab 08/30/19 0407  NA 137  K 4.5  CL 102  CO2 24  GLUCOSE 92  BUN 20  CREATININE 0.89  CALCIUM 8.8*   GFR: Estimated Creatinine Clearance: 107.1 mL/min (by C-G formula based on SCr of 0.89 mg/dL). Liver Function Tests: No results for input(s): AST, ALT, ALKPHOS, BILITOT, PROT, ALBUMIN in the last 168 hours. No results for input(s): LIPASE, AMYLASE in the last 168 hours. No results for input(s): AMMONIA in the last 168 hours. Coagulation Profile: No results for input(s): INR, PROTIME in the last 168 hours. Cardiac Enzymes: No results for input(s): CKTOTAL, CKMB, CKMBINDEX, TROPONINI in the last 168 hours. BNP (last 3 results) No results for input(s): PROBNP in the last 8760 hours. HbA1C: No results for input(s): HGBA1C in the last 72 hours. CBG: Recent Labs  Lab 08/29/19 2101 08/30/19 0735 08/30/19 1152 08/30/19 1841 08/30/19 2140  GLUCAP 91 83 98 124* 113*   Lipid Profile: No results for input(s): CHOL, HDL, LDLCALC, TRIG, CHOLHDL, LDLDIRECT in the last 72 hours. Thyroid Function Tests: No results for input(s): TSH, T4TOTAL, FREET4, T3FREE, THYROIDAB in the last 72 hours. Anemia Panel: No results for input(s): VITAMINB12, FOLATE, FERRITIN, TIBC, IRON, RETICCTPCT in the last 72 hours. Urine analysis:    Component Value Date/Time   COLORURINE YELLOW 07/25/2019 1805   APPEARANCEUR HAZY (A) 07/25/2019 1805   LABSPEC 1.011 07/25/2019 1805   PHURINE 6.0 07/25/2019 1805   GLUCOSEU NEGATIVE 07/25/2019 1805   HGBUR SMALL (A) 07/25/2019 1805   BILIRUBINUR NEGATIVE 07/25/2019 1805   BILIRUBINUR negative 04/10/2016 1702   KETONESUR NEGATIVE 07/25/2019 1805   PROTEINUR 30 (A) 07/25/2019 1805   UROBILINOGEN 0.2 04/10/2016 1702   UROBILINOGEN 0.2  12/04/2013 1044   NITRITE NEGATIVE 07/25/2019 1805   LEUKOCYTESUR SMALL (A) 07/25/2019 1805   No results found for this or any previous visit (from the past 240 hour(s)).    Radiology Studies: No results found.  Scheduled Meds: . benztropine  1 mg Oral Daily  . buPROPion  300 mg Oral Daily  . diltiazem  240 mg Oral Daily  . enoxaparin (LOVENOX) injection  40 mg Subcutaneous Daily  . folic acid  1 mg Oral Daily  . gabapentin  300 mg Oral BID  . ibuprofen  400 mg Oral TID  . insulin aspart  0-5 Units Subcutaneous QHS  . insulin aspart  0-9 Units Subcutaneous TID WC  . lidocaine  1 patch Transdermal Q24H  . losartan  50 mg Oral Daily  . multivitamin with minerals  1 tablet Oral Daily  . nicotine  21 mg Transdermal Daily  . oxyCODONE  10 mg Oral Q12H  . paliperidone  234 mg Intramuscular Q30 days  . pantoprazole  40 mg Oral Daily  . polyethylene glycol  17 g Oral BID  . thiamine  250 mg Oral Daily  . traZODone  50 mg Oral QHS  .  vitamin B-12  2,000 mcg Oral Daily   Continuous Infusions: . sodium chloride Stopped (07/28/19 1838)  . cefTRIAXone (ROCEPHIN)  IV 2 g (08/30/19 1002)  . vancomycin 1,000 mg (08/30/19 2256)     LOS: 36 days   Time spent: 15 minutes.  Glade Lloyd, MD [facility] [email or web address] 08/31/2019, 7:22 AM

## 2019-08-31 NOTE — Progress Notes (Signed)
Occupational Therapy Treatment Patient Details Name: [name] MRN: [id number] DOB: 29-Aug-1965 Today's Date: 08/31/2019    History of present illness 54 yo male admitted with toxic metabolic encephalopathy, bacterial meningitis, unwitness fall 5/2. Hx of recent lumbar sg L3-L5 03/2019, polysubstance abuse, schizophrenia, ETOH abuse, CVA, bipolar d/o, back pain, L ankle fusion 2019, R TKA 2018, asthma.   OT comments  Patient continues to need supervision for safety with all tasks. Patient has more pain today limiting activity tolerance.   Follow Up Recommendations  No OT follow up    Equipment Recommendations  None recommended by OT    Recommendations for Other Services      Precautions / Restrictions Precautions Precautions: Fall Precaution Comments: impulsive Required Braces or Orthoses: Spinal Brace Spinal Brace: Thoracolumbosacral orthotic;Applied in sitting position Restrictions Weight Bearing Restrictions: No Other Position/Activity Restrictions: WBAT       Mobility Bed Mobility               General bed mobility comments: Patient supervision to transfer to side of bed. Patient has more complaints of pain today and required min assist for legs to return to supine.  Transfers                 General transfer comment: cga for safety to ambulate in room with RW. Patient required verbal cues to not pull IV line too taught. Patient not observant to environment and lossed balance with walker when rolling over blanket on the floor.    Balance                                           ADL either performed or assessed with clinical judgement   ADL       Grooming: Wash/dry hands;Wash/dry face;Supervision/safety Grooming Details (indicate cue type and reason): Patient stood at sink to perform grooming task with supervision - washing his face, shaving, washing his hands.                                     Vision        Perception     Praxis      Cognition Arousal/Alertness: Awake/alert Behavior During Therapy: WFL for tasks assessed/performed Overall Cognitive Status: Within Functional Limits for tasks assessed                                          Exercises     Shoulder Instructions       General Comments      Pertinent Vitals/ Pain       Pain Assessment: 0-10 Pain Score: 10-Worst pain ever Pain Location: right hip Pain Descriptors / Indicators: Grimacing;Guarding Pain Intervention(s): Limited activity within patient's tolerance;Monitored during session  Home Living                                          Prior Functioning/Environment              Frequency  Min 1X/week        Progress Toward Goals  OT Goals(current goals can now be found  in the care plan section)  Progress towards OT goals: Progressing toward goals  Acute Rehab OT Goals Potential to Achieve Goals: Fair  Plan Discharge plan remains appropriate    Co-evaluation                 AM-PAC OT "6 Clicks" Daily Activity     Outcome Measure   Help from another person eating meals?: None Help from another person taking care of personal grooming?: A Little Help from another person toileting, which includes using toliet, bedpan, or urinal?: A Little Help from another person bathing (including washing, rinsing, drying)?: A Little Help from another person to put on and taking off regular upper body clothing?: A Little Help from another person to put on and taking off regular lower body clothing?: A Little 6 Click Score: 19    End of Session Equipment Utilized During Treatment: Rolling walker;Back brace  OT Visit Diagnosis: Unsteadiness on feet (R26.81);Pain Pain - Right/Left: Right Pain - part of body: Hip   Activity Tolerance Patient limited by pain   Patient Left in bed;with call bell/phone within reach;with bed alarm set   Nurse Communication           Time: 1610-9604 OT Time Calculation (min): 12 min  Charges: OT General Charges $OT Visit: 1 Visit OT Treatments $Self Care/Home Management : 8-22 mins  Waldron Session, OTR/L Acute Care Rehab Services  Office [phone]    Kelli Churn 08/31/2019, 12:27 PM

## 2019-09-01 LAB — GLUCOSE, CAPILLARY
Glucose-Capillary: 90 mg/dL (ref 70–99)
Glucose-Capillary: 116 mg/dL — ABNORMAL HIGH (ref 70–99)
Glucose-Capillary: 99 mg/dL (ref 70–99)
Glucose-Capillary: 108 mg/dL — ABNORMAL HIGH (ref 70–99)

## 2019-09-01 NOTE — Plan of Care (Signed)
  Problem: Health Behavior/Discharge Planning: Goal: Ability to manage health-related needs will improve Outcome: Progressing   Problem: Activity: Goal: Risk for activity intolerance will decrease Outcome: Progressing   Problem: Coping: Goal: Level of anxiety will decrease Outcome: Progressing   

## 2019-09-01 NOTE — Progress Notes (Signed)
PROGRESS NOTE  [name]  ZOX:[id number] DOB: June 21, 1965 DOA: 07/25/2019 PCP: [name], MD   Brief Narrative:  54 y.o. male with a history of lumbar laminectomy/fusion, polysubstance abuse, schizophrenia, CVA, asthma, and GERD who was brought to the ED 07/25/2019 by his girlfriend due to lethargy. He was tachycardic and appeared dehydrated, reporting taking many substances the prior evening. He was initially admitted to the ICU for toxic encephalopathy, possible EtOH withdrawal on precedex and empirically treated for meningitis with resolution of fever.Further work-up with CT of the lumbar spine showed osteomyelitis/discitis with epidural phlegmon not amenable to drainage.Infectious disease recommended 6 weeks of intravenous antibiotic therapy with ceftriaxone and vancomycin (final day 09/05/2019).  Patient is not a candidate for outpatient antibiotic therapy due to his history of polysubstance abuse.  Assessment & Plan:  Acute lumbar and thoracic spine osteomyelitis/discitis. - Continue ceftriaxone, vancomycin through 6/8 per ID recommendations. Not candidate for outpatient IV antibiotics due to polysubstance abuse, lack of social supports; has not been able to be placed by TOC. No evidence of complications at this time, urged to continue ambulation/OOB as much as tolerable. - Continue pain control along with bowel regimen.  Continue Robaxin as needed for spasms. - OOB, Continue regular PT/OT -Currently hemodynamically stable with no temperature spikes  Polysubstance and tobacco use: PCP, heroin, cocaine, alcohol binge drinking reported prior to admission. - No longer showing evidence of intoxication or withdrawal.  - Cessation counseling provided.  - Continue empiric thiamine.  - Continue nicotine patch.  Schizophrenia, depression: Quiescent currently.  - Continuebupropion, benztropine, monthly invega (given 08/10/2019) - Follow up with ACT team at discharge per  psychiatry.  Acute toxic encephalopathy: Resolved.  HTN: Well controlled - Continue diltiazem, losartan.   Diabetes mellitus type 2: Well controlled with HbA1c 6.6%.  - Continue SSI for tight control with systemic infection.  Blood sugars stable  GERD:  - Continue PPI  Vitamin B12 deficiency:  - Continue supplementation.  Insomnia:  - Continue trazodone qHS  DVT prophylaxis: Lovenox Code Status: Full Family Communication: None at bedside Disposition Plan: Status is: Inpatient  Remains inpatient appropriate because:Unsafe d/c plan  Dispo: The patient is from: Home              Anticipated d/c is to: Home              Anticipated d/c date is: 09/05/2019              Patient currently is not medically stable to d/c.  Consultants:   PCCM primary on 4/29  Neurology 5/3  ID 5/7  Psychiatry 5/11  Procedures:   Lumbar puncture  PICC placement  Antimicrobials:  Vancomycin, ceftriaxone through 6/8   Subjective: Patient seen and examined at bedside.  Poor historian.  Complains of intermittent right lower extremity pain.  No overnight fever, vomiting or shortness of breath reported.   Objective: Vitals:   08/31/19 0524 08/31/19 1211 08/31/19 2201 09/01/19 0500  BP: 103/76 99/84 110/89 114/78  Pulse: 92 (!) 104 (!) 103 92  Resp: 20  18 16   Temp: 98.1 F (36.7 C) 98.5 F (36.9 C) 98.2 F (36.8 C) 98.8 F (37.1 C)  TempSrc: Oral Oral Oral Oral  SpO2: 98% 99% 99% 96%  Weight:    90.9 kg  Height:        Intake/Output Summary (Last 24 hours) at 09/01/2019 0739 Last data filed at 09/01/2019 0510 Gross per 24 hour  Intake 780 ml  Output 3850 ml  Net -3070 ml   Filed Weights   08/30/19 1300 08/30/19 1343 09/01/19 0500  Weight: 89.8 kg 90 kg 90.9 kg   Gen: No acute distress.  Poor historian.  Looks chronically ill Pulm: Bilateral decreased breath sounds at bases, some scattered crackles CV: S1-S2 positive, currently rate controlled GI: Soft, nontender,  nondistended.  Normal bowel sounds are heard  Ext: No edema or clubbing  Data Reviewed: I have personally reviewed following labs and imaging studies  CBC: Recent Labs  Lab 08/30/19 0407  WBC 6.8  HGB 10.1*  HCT 33.4*  MCV 84.8  PLT 327   Basic Metabolic Panel: Recent Labs  Lab 08/30/19 0407  NA 137  K 4.5  CL 102  CO2 24  GLUCOSE 92  BUN 20  CREATININE 0.89  CALCIUM 8.8*   GFR: Estimated Creatinine Clearance: 107.6 mL/min (by C-G formula based on SCr of 0.89 mg/dL). Liver Function Tests: No results for input(s): AST, ALT, ALKPHOS, BILITOT, PROT, ALBUMIN in the last 168 hours. No results for input(s): LIPASE, AMYLASE in the last 168 hours. No results for input(s): AMMONIA in the last 168 hours. Coagulation Profile: No results for input(s): INR, PROTIME in the last 168 hours. Cardiac Enzymes: No results for input(s): CKTOTAL, CKMB, CKMBINDEX, TROPONINI in the last 168 hours. BNP (last 3 results) No results for input(s): PROBNP in the last 8760 hours. HbA1C: No results for input(s): HGBA1C in the last 72 hours. CBG: Recent Labs  Lab 08/30/19 2140 08/31/19 0729 08/31/19 1144 08/31/19 1643 08/31/19 2158  GLUCAP 113* 92 92 104* 147*   Lipid Profile: No results for input(s): CHOL, HDL, LDLCALC, TRIG, CHOLHDL, LDLDIRECT in the last 72 hours. Thyroid Function Tests: No results for input(s): TSH, T4TOTAL, FREET4, T3FREE, THYROIDAB in the last 72 hours. Anemia Panel: No results for input(s): VITAMINB12, FOLATE, FERRITIN, TIBC, IRON, RETICCTPCT in the last 72 hours. Urine analysis:    Component Value Date/Time   COLORURINE YELLOW 07/25/2019 1805   APPEARANCEUR HAZY (A) 07/25/2019 1805   LABSPEC 1.011 07/25/2019 1805   PHURINE 6.0 07/25/2019 1805   GLUCOSEU NEGATIVE 07/25/2019 1805   HGBUR SMALL (A) 07/25/2019 1805   BILIRUBINUR NEGATIVE 07/25/2019 1805   BILIRUBINUR negative 04/10/2016 1702   KETONESUR NEGATIVE 07/25/2019 1805   PROTEINUR 30 (A) 07/25/2019  1805   UROBILINOGEN 0.2 04/10/2016 1702   UROBILINOGEN 0.2 12/04/2013 1044   NITRITE NEGATIVE 07/25/2019 1805   LEUKOCYTESUR SMALL (A) 07/25/2019 1805   No results found for this or any previous visit (from the past 240 hour(s)).    Radiology Studies: No results found.  Scheduled Meds: . benztropine  1 mg Oral Daily  . buPROPion  300 mg Oral Daily  . diltiazem  240 mg Oral Daily  . enoxaparin (LOVENOX) injection  40 mg Subcutaneous Daily  . folic acid  1 mg Oral Daily  . gabapentin  300 mg Oral BID  . ibuprofen  400 mg Oral TID  . insulin aspart  0-5 Units Subcutaneous QHS  . insulin aspart  0-9 Units Subcutaneous TID WC  . lidocaine  1 patch Transdermal Q24H  . losartan  50 mg Oral Daily  . multivitamin with minerals  1 tablet Oral Daily  . nicotine  21 mg Transdermal Daily  . oxyCODONE  10 mg Oral Q12H  . paliperidone  234 mg Intramuscular Q30 days  . pantoprazole  40 mg Oral Daily  . polyethylene glycol  17 g Oral BID  . thiamine  250 mg Oral Daily  .  traZODone  50 mg Oral QHS  . vitamin B-12  2,000 mcg Oral Daily   Continuous Infusions: . sodium chloride Stopped (07/28/19 1838)  . cefTRIAXone (ROCEPHIN)  IV 2 g (08/31/19 0911)  . vancomycin 1,000 mg (08/31/19 2131)     LOS: 37 days   Time spent: 15 minutes.  Glade Lloyd, MD [facility] [email or web address] 09/01/2019, 7:39 AM

## 2019-09-01 NOTE — Progress Notes (Signed)
Orthopedic Tech Progress Note Patient Details:  [name] September 19, 1965 [id number]  Patient ID: [name], male   DOB: [birth year], 54 y.o.   MRN: [id number]   [name] 09/01/2019, 4:08 PMCalled [employer] for TLSO brace.

## 2019-09-01 NOTE — Progress Notes (Signed)
Permission given from patient to speak to his significant other [name]. Pt and significant other inquired about pain medications, specifically Percocet 7.5 mg-325 mg. Will contact Hospitalist provider.

## 2019-09-02 LAB — GLUCOSE, CAPILLARY
Glucose-Capillary: 86 mg/dL (ref 70–99)
Glucose-Capillary: 79 mg/dL (ref 70–99)
Glucose-Capillary: 109 mg/dL — ABNORMAL HIGH (ref 70–99)
Glucose-Capillary: 117 mg/dL — ABNORMAL HIGH (ref 70–99)

## 2019-09-02 MED ORDER — OXYCODONE HCL 5 MG PO TABS
5.0000 mg | ORAL_TABLET | ORAL | Status: DC | PRN
  Administered 2019-09-02 – 2019-09-05 (×12): 10 mg via ORAL
  Filled 2019-09-02 (×12): qty 2

## 2019-09-02 MED ORDER — OXYCODONE HCL ER 10 MG PO T12A
10.0000 mg | EXTENDED_RELEASE_TABLET | Freq: Every day | ORAL | Status: DC
  Administered 2019-09-03 – 2019-09-05 (×3): 10 mg via ORAL
  Filled 2019-09-02 (×3): qty 1

## 2019-09-02 MED ORDER — METHOCARBAMOL 500 MG PO TABS
750.0000 mg | ORAL_TABLET | Freq: Three times a day (TID) | ORAL | Status: DC | PRN
  Administered 2019-09-02 – 2019-09-05 (×8): 750 mg via ORAL
  Filled 2019-09-02 (×7): qty 2

## 2019-09-02 NOTE — Progress Notes (Signed)
Pharmacy Antibiotic Note  [name] is a 54 y.o. male admitted on 07/25/2019 with altered mental status. Patient previously on Acyclovir, Vancomycin, Ampicillin, and Ceftriaxone for meningitis. Antibiotics  for meningitis completed . However,  As of 5/7 plan per ID, to continue vancomycin and rocephin for 6 weeks total - last dose on 6/8 for discitis/osteomyelitis.    Today, 09/02/19 Day 39/42 Vanc/Rocephin   Vanc trough was increased to 23 mcg/ml on Vanc 1500mg  q12  Plan:  Continue Vancomycin 1000mg  IV q12h > completion on 09/05/2019  Rocephin 2g IV daily per Md  Height: 5\' 10"  (177.8 cm) Weight: 90.9 kg (200 lb 4.8 oz) IBW/kg (Calculated) : 73  Temp (24hrs), Avg:98 F (36.7 C), Min:97.5 F (36.4 C), Max:98.2 F (36.8 C)  Recent Labs  Lab 08/30/19 0407 08/30/19 0847  WBC 6.8  --   CREATININE 0.89  --   VANCOTROUGH  --  23*    Estimated Creatinine Clearance: 107.6 mL/min (by C-G formula based on SCr of 0.89 mg/dL).    Allergies  Allergen Reactions  . Chlorhexidine Itching and Rash    /28 Ceftriaxone x 1, 4/30 >> 4/28 Doxycycline >> 4/28 4/29 Ampicillin/sulbactam >> 4/30 4/30 Ampicillin>> 5/3 AM, resumed 5/3 PM >> 5/10 4/30 Acyclovir >> 5/7 4/30 Vancomycin >>   Dose adjustments/levels this admission: 5/4 VT = 16 on 1500 q12 5/10 VT = 13, continue 5/19 VT = 15, continue  5/26 VT = 16, continue 6/2 VT = 23, decr to 1000 mg q12  Microbiology results: 4/27 Influenza A/B: neg, Covid: neg 4/27 RPR: nonreactive 4/27 HIV: nonreactive 4/27 BCx: ngF 4/27 UCx: ngF  4/29 MRSA PCR: neg 5/2 CSF: gram stain neg,  ngF 5/2 CSF fungal: No Fungus Isolated After 21 days  5/2 CSF HSV: Neg 5/2 CSF VDRL: neg  Thank you for allowing pharmacy to be a part of this patient's care.  Minda Ditto PharmD 09/02/2019, 11:03 AM

## 2019-09-02 NOTE — Progress Notes (Signed)
PROGRESS NOTE  [name]  ZOX:[id number] DOB: Feb 27, 1966 DOA: 07/25/2019 PCP: [name], MD   Brief Narrative:  54 y.o. male with a history of lumbar laminectomy/fusion, polysubstance abuse, schizophrenia, CVA, asthma, and GERD who was brought to the ED 07/25/2019 by his girlfriend due to lethargy. He was tachycardic and appeared dehydrated, reporting taking many substances the prior evening. He was initially admitted to the ICU for toxic encephalopathy, possible EtOH withdrawal on precedex and empirically treated for meningitis with resolution of fever.Further work-up with CT of the lumbar spine showed osteomyelitis/discitis with epidural phlegmon not amenable to drainage.Infectious disease recommended 6 weeks of intravenous antibiotic therapy with ceftriaxone and vancomycin (final day 09/05/2019).  Patient is not a candidate for outpatient antibiotic therapy due to his history of polysubstance abuse.  Assessment & Plan:  Acute lumbar and thoracic spine osteomyelitis/discitis. - Continue ceftriaxone, vancomycin through 6/8 per ID recommendations. Not candidate for outpatient IV antibiotics due to polysubstance abuse, lack of social supports; has not been able to be placed by TOC. No evidence of complications at this time, urged to continue ambulation/OOB as much as tolerable. - Continue pain control along with bowel regimen.  Continue Robaxin as needed for spasms. - OOB, Continue regular PT/OT -No recent temperature spikes.  Monitor labs intermittently.  Polysubstance and tobacco use: PCP, heroin, cocaine, alcohol binge drinking reported prior to admission. - No longer showing evidence of intoxication or withdrawal.  - Cessation counseling provided.  - Continue empiric thiamine.  - Continue nicotine patch.  Schizophrenia, depression: Quiescent currently.  - Continuebupropion, benztropine, monthly invega (given 08/10/2019) - Follow up with ACT team at discharge per  psychiatry.  Acute toxic encephalopathy: Resolved.  HTN: Well controlled - Continue diltiazem, losartan.   Diabetes mellitus type 2: Well controlled with HbA1c 6.6%.  - Continue SSI for tight control with systemic infection.  Blood sugars stable  GERD:  - Continue PPI  Vitamin B12 deficiency:  - Continue supplementation.  Insomnia:  - Continue trazodone qHS  DVT prophylaxis: Lovenox Code Status: Full Family Communication: None at bedside Disposition Plan: Status is: Inpatient  Remains inpatient appropriate because:Unsafe d/c plan  Dispo: The patient is from: Home              Anticipated d/c is to: Home              Anticipated d/c date is: 09/05/2019              Patient currently is not medically stable to d/c.  Consultants:   PCCM primary on 4/29  Neurology 5/3  ID 5/7  Psychiatry 5/11  Procedures:   Lumbar puncture  PICC placement  Antimicrobials:  Vancomycin, ceftriaxone through 6/8   Subjective: Patient seen and examined at bedside.  Poor historian.  Denies any new complaints.  Still has intermittent right hip and lower extremity pain.   objective: Vitals:   09/01/19 0917 09/01/19 1419 09/01/19 1956 09/02/19 0541  BP: (!) 130/94 (!) 129/114 (!) 120/96 111/77  Pulse: 94 98 100 86  Resp: 18 18 20 16   Temp: 97.9 F (36.6 C) (!) 97.5 F (36.4 C) 98.2 F (36.8 C) 98.2 F (36.8 C)  TempSrc: Oral  Oral Oral  SpO2:  100% 99% 97%  Weight:      Height:        Intake/Output Summary (Last 24 hours) at 09/02/2019 0751 Last data filed at 09/02/2019 0252 Gross per 24 hour  Intake 1265 ml  Output 2800 ml  Net -  1535 ml   Filed Weights   08/30/19 1300 08/30/19 1343 09/01/19 0500  Weight: 89.8 kg 90 kg 90.9 kg   Gen: No distress.  Poor historian.  Looks chronically ill Pulm: Bilateral decreased breath sounds at bases, no wheezing CV: Rate controlled, S1-S2 positive GI: Soft, nontender, nondistended.  Bowel sounds are heard  Ext: No edema or  clubbing  Data Reviewed: I have personally reviewed following labs and imaging studies  CBC: Recent Labs  Lab 08/30/19 0407  WBC 6.8  HGB 10.1*  HCT 33.4*  MCV 84.8  PLT 327   Basic Metabolic Panel: Recent Labs  Lab 08/30/19 0407  NA 137  K 4.5  CL 102  CO2 24  GLUCOSE 92  BUN 20  CREATININE 0.89  CALCIUM 8.8*   GFR: Estimated Creatinine Clearance: 107.6 mL/min (by C-G formula based on SCr of 0.89 mg/dL). Liver Function Tests: No results for input(s): AST, ALT, ALKPHOS, BILITOT, PROT, ALBUMIN in the last 168 hours. No results for input(s): LIPASE, AMYLASE in the last 168 hours. No results for input(s): AMMONIA in the last 168 hours. Coagulation Profile: No results for input(s): INR, PROTIME in the last 168 hours. Cardiac Enzymes: No results for input(s): CKTOTAL, CKMB, CKMBINDEX, TROPONINI in the last 168 hours. BNP (last 3 results) No results for input(s): PROBNP in the last 8760 hours. HbA1C: No results for input(s): HGBA1C in the last 72 hours. CBG: Recent Labs  Lab 09/01/19 0752 09/01/19 1121 09/01/19 1628 09/01/19 1958 09/02/19 0749  GLUCAP 90 116* 99 108* 86   Lipid Profile: No results for input(s): CHOL, HDL, LDLCALC, TRIG, CHOLHDL, LDLDIRECT in the last 72 hours. Thyroid Function Tests: No results for input(s): TSH, T4TOTAL, FREET4, T3FREE, THYROIDAB in the last 72 hours. Anemia Panel: No results for input(s): VITAMINB12, FOLATE, FERRITIN, TIBC, IRON, RETICCTPCT in the last 72 hours. Urine analysis:    Component Value Date/Time   COLORURINE YELLOW 07/25/2019 1805   APPEARANCEUR HAZY (A) 07/25/2019 1805   LABSPEC 1.011 07/25/2019 1805   PHURINE 6.0 07/25/2019 1805   GLUCOSEU NEGATIVE 07/25/2019 1805   HGBUR SMALL (A) 07/25/2019 1805   BILIRUBINUR NEGATIVE 07/25/2019 1805   BILIRUBINUR negative 04/10/2016 1702   KETONESUR NEGATIVE 07/25/2019 1805   PROTEINUR 30 (A) 07/25/2019 1805   UROBILINOGEN 0.2 04/10/2016 1702   UROBILINOGEN 0.2  12/04/2013 1044   NITRITE NEGATIVE 07/25/2019 1805   LEUKOCYTESUR SMALL (A) 07/25/2019 1805   No results found for this or any previous visit (from the past 240 hour(s)).    Radiology Studies: No results found.  Scheduled Meds: . benztropine  1 mg Oral Daily  . buPROPion  300 mg Oral Daily  . diltiazem  240 mg Oral Daily  . enoxaparin (LOVENOX) injection  40 mg Subcutaneous Daily  . folic acid  1 mg Oral Daily  . gabapentin  300 mg Oral BID  . ibuprofen  400 mg Oral TID  . insulin aspart  0-5 Units Subcutaneous QHS  . insulin aspart  0-9 Units Subcutaneous TID WC  . lidocaine  1 patch Transdermal Q24H  . losartan  50 mg Oral Daily  . multivitamin with minerals  1 tablet Oral Daily  . nicotine  21 mg Transdermal Daily  . oxyCODONE  10 mg Oral Q12H  . paliperidone  234 mg Intramuscular Q30 days  . pantoprazole  40 mg Oral Daily  . polyethylene glycol  17 g Oral BID  . thiamine  250 mg Oral Daily  . traZODone  50  mg Oral QHS  . vitamin B-12  2,000 mcg Oral Daily   Continuous Infusions: . sodium chloride Stopped (07/28/19 1838)  . cefTRIAXone (ROCEPHIN)  IV 2 g (09/01/19 0927)  . vancomycin 1,000 mg (09/01/19 2202)     LOS: 38 days   Time spent: 15 minutes.  Glade Lloyd, MD [facility] [email or web address] 09/02/2019, 7:51 AM

## 2019-09-03 LAB — GLUCOSE, CAPILLARY
Glucose-Capillary: 94 mg/dL (ref 70–99)
Glucose-Capillary: 84 mg/dL (ref 70–99)
Glucose-Capillary: 108 mg/dL — ABNORMAL HIGH (ref 70–99)
Glucose-Capillary: 113 mg/dL — ABNORMAL HIGH (ref 70–99)

## 2019-09-03 NOTE — Progress Notes (Signed)
PROGRESS NOTE  [name]  NFA:[id number] DOB: Mar 03, 1966 DOA: 07/25/2019 PCP: [name], MD   Brief Narrative:  54 y.o. male with a history of lumbar laminectomy/fusion, polysubstance abuse, schizophrenia, CVA, asthma, and GERD who was brought to the ED 07/25/2019 by his girlfriend due to lethargy. He was tachycardic and appeared dehydrated, reporting taking many substances the prior evening. He was initially admitted to the ICU for toxic encephalopathy, possible EtOH withdrawal on precedex and empirically treated for meningitis with resolution of fever.Further work-up with CT of the lumbar spine showed osteomyelitis/discitis with epidural phlegmon not amenable to drainage.Infectious disease recommended 6 weeks of intravenous antibiotic therapy with ceftriaxone and vancomycin (final day 09/05/2019).  Patient is not a candidate for outpatient antibiotic therapy due to his history of polysubstance abuse.  Assessment & Plan:  Acute lumbar and thoracic spine osteomyelitis/discitis. - Continue ceftriaxone, vancomycin through 6/8 per ID recommendations. Not candidate for outpatient IV antibiotics due to polysubstance abuse, lack of social supports; has not been able to be placed by TOC. No evidence of complications at this time, urged to continue ambulation/OOB as much as tolerable. - Continue pain control along with bowel regimen.  Continue Robaxin as needed for spasms.  Decreased OxyContin to 10 mg once a day on 09/02/2019. - OOB, Continue regular PT/OT -No recent temperature spikes.  Monitor labs intermittently.  Polysubstance and tobacco use: PCP, heroin, cocaine, alcohol binge drinking reported prior to admission. - No longer showing evidence of intoxication or withdrawal.  - Cessation counseling provided.  - Continue empiric thiamine.  - Continue nicotine patch.  Schizophrenia, depression: Quiescent currently.  - Continuebupropion, benztropine, monthly invega (given 08/10/2019) -  Follow up with ACT team at discharge per psychiatry.  Acute toxic encephalopathy: Resolved.  HTN: Well controlled - Continue diltiazem, losartan.   Diabetes mellitus type 2: Well controlled with HbA1c 6.6%.  - Continue SSI for tight control with systemic infection.  Blood sugars stable  GERD:  - Continue PPI  Vitamin B12 deficiency:  - Continue supplementation.  Insomnia:  - Continue trazodone qHS  DVT prophylaxis: Lovenox Code Status: Full Family Communication: None at bedside.  Spoke to significant other on 09/02/2019 on phone Disposition Plan: Status is: Inpatient  Remains inpatient appropriate because:Unsafe d/c plan  Dispo: The patient is from: Home              Anticipated d/c is to: Home              Anticipated d/c date is: 09/05/2019              Patient currently is not medically stable to d/c.  Consultants:   PCCM primary on 4/29  Neurology 5/3  ID 5/7  Psychiatry 5/11  Procedures:   Lumbar puncture  PICC placement  Antimicrobials:  Vancomycin, ceftriaxone through 6/8   Subjective: Patient seen and examined at bedside.  Poor historian.  Still complains of intermittent lower back and right lower extremity pain.  States that he is able to walk to the bathroom.   Objective: Vitals:   09/02/19 1313 09/02/19 2047 09/03/19 0548 09/03/19 0622  BP: 101/80 117/88 (!) 145/128 (!) 132/96  Pulse: (!) 109 100 92 92  Resp: 18 18 19    Temp: 98.2 F (36.8 C) 98.5 F (36.9 C) 97.7 F (36.5 C)   TempSrc: Oral Oral    SpO2: 100% 97% 97%   Weight:      Height:        Intake/Output Summary (Last 24  hours) at 09/03/2019 0740 Last data filed at 09/03/2019 0735 Gross per 24 hour  Intake 800 ml  Output 3500 ml  Net -2700 ml   Filed Weights   08/30/19 1300 08/30/19 1343 09/01/19 0500  Weight: 89.8 kg 90 kg 90.9 kg   Gen: No acute distress.  Poor historian.  Looks chronically ill Pulm: Bilateral decreased breath sounds at bases with some crackles  CV: S1-S2  positive, rate controlled GI: Soft, nontender, nondistended.  Normal bowel sounds are heard Ext: No edema or clubbing.  Able to move lower extremities.  Data Reviewed: I have personally reviewed following labs and imaging studies  CBC: Recent Labs  Lab 08/30/19 0407  WBC 6.8  HGB 10.1*  HCT 33.4*  MCV 84.8  PLT 327   Basic Metabolic Panel: Recent Labs  Lab 08/30/19 0407  NA 137  K 4.5  CL 102  CO2 24  GLUCOSE 92  BUN 20  CREATININE 0.89  CALCIUM 8.8*   GFR: Estimated Creatinine Clearance: 107.6 mL/min (by C-G formula based on SCr of 0.89 mg/dL). Liver Function Tests: No results for input(s): AST, ALT, ALKPHOS, BILITOT, PROT, ALBUMIN in the last 168 hours. No results for input(s): LIPASE, AMYLASE in the last 168 hours. No results for input(s): AMMONIA in the last 168 hours. Coagulation Profile: No results for input(s): INR, PROTIME in the last 168 hours. Cardiac Enzymes: No results for input(s): CKTOTAL, CKMB, CKMBINDEX, TROPONINI in the last 168 hours. BNP (last 3 results) No results for input(s): PROBNP in the last 8760 hours. HbA1C: No results for input(s): HGBA1C in the last 72 hours. CBG: Recent Labs  Lab 09/01/19 1958 09/02/19 0749 09/02/19 1122 09/02/19 1629 09/02/19 2049  GLUCAP 108* 86 79 109* 117*   Lipid Profile: No results for input(s): CHOL, HDL, LDLCALC, TRIG, CHOLHDL, LDLDIRECT in the last 72 hours. Thyroid Function Tests: No results for input(s): TSH, T4TOTAL, FREET4, T3FREE, THYROIDAB in the last 72 hours. Anemia Panel: No results for input(s): VITAMINB12, FOLATE, FERRITIN, TIBC, IRON, RETICCTPCT in the last 72 hours. Urine analysis:    Component Value Date/Time   COLORURINE YELLOW 07/25/2019 1805   APPEARANCEUR HAZY (A) 07/25/2019 1805   LABSPEC 1.011 07/25/2019 1805   PHURINE 6.0 07/25/2019 1805   GLUCOSEU NEGATIVE 07/25/2019 1805   HGBUR SMALL (A) 07/25/2019 1805   BILIRUBINUR NEGATIVE 07/25/2019 1805   BILIRUBINUR negative  04/10/2016 1702   KETONESUR NEGATIVE 07/25/2019 1805   PROTEINUR 30 (A) 07/25/2019 1805   UROBILINOGEN 0.2 04/10/2016 1702   UROBILINOGEN 0.2 12/04/2013 1044   NITRITE NEGATIVE 07/25/2019 1805   LEUKOCYTESUR SMALL (A) 07/25/2019 1805   No results found for this or any previous visit (from the past 240 hour(s)).    Radiology Studies: No results found.  Scheduled Meds: . benztropine  1 mg Oral Daily  . buPROPion  300 mg Oral Daily  . diltiazem  240 mg Oral Daily  . enoxaparin (LOVENOX) injection  40 mg Subcutaneous Daily  . folic acid  1 mg Oral Daily  . gabapentin  300 mg Oral BID  . ibuprofen  400 mg Oral TID  . insulin aspart  0-5 Units Subcutaneous QHS  . insulin aspart  0-9 Units Subcutaneous TID WC  . lidocaine  1 patch Transdermal Q24H  . losartan  50 mg Oral Daily  . multivitamin with minerals  1 tablet Oral Daily  . nicotine  21 mg Transdermal Daily  . oxyCODONE  10 mg Oral Daily  . paliperidone  234 mg  Intramuscular Q30 days  . pantoprazole  40 mg Oral Daily  . polyethylene glycol  17 g Oral BID  . thiamine  250 mg Oral Daily  . traZODone  50 mg Oral QHS  . vitamin B-12  2,000 mcg Oral Daily   Continuous Infusions: . sodium chloride Stopped (07/28/19 1838)  . cefTRIAXone (ROCEPHIN)  IV 2 g (09/02/19 1056)  . vancomycin 1,000 mg (09/02/19 2225)     LOS: 39 days   Time spent: 15 minutes.  Glade Lloyd, MD [facility] [email or web address] 09/03/2019, 7:40 AM

## 2019-09-04 LAB — CREATININE, SERUM
Creatinine, Ser: 0.73 mg/dL (ref 0.61–1.24)
GFR calc Af Amer: >60 mL/min (ref >60)
GFR calc non Af Amer: >60 mL/min (ref >60)

## 2019-09-04 LAB — GLUCOSE, CAPILLARY
Glucose-Capillary: 100 mg/dL — ABNORMAL HIGH (ref 70–99)
Glucose-Capillary: 111 mg/dL — ABNORMAL HIGH (ref 70–99)
Glucose-Capillary: 86 mg/dL (ref 70–99)
Glucose-Capillary: 126 mg/dL — ABNORMAL HIGH (ref 70–99)

## 2019-09-04 NOTE — Progress Notes (Signed)
PROGRESS NOTE  [name]  ZOX:[id number] DOB: 1965-09-27 DOA: 07/25/2019 PCP: [name], MD   Brief Narrative:  54 y.o. male with a history of lumbar laminectomy/fusion, polysubstance abuse, schizophrenia, CVA, asthma, and GERD who was brought to the ED 07/25/2019 by his girlfriend due to lethargy. He was tachycardic and appeared dehydrated, reporting taking many substances the prior evening. He was initially admitted to the ICU for toxic encephalopathy, possible EtOH withdrawal on precedex and empirically treated for meningitis with resolution of fever.Further work-up with CT of the lumbar spine showed osteomyelitis/discitis with epidural phlegmon not amenable to drainage.Infectious disease recommended 6 weeks of intravenous antibiotic therapy with ceftriaxone and vancomycin (final day 09/05/2019).  Patient is not a candidate for outpatient antibiotic therapy due to his history of polysubstance abuse.  Assessment & Plan:  Acute lumbar and thoracic spine osteomyelitis/discitis. - Continue ceftriaxone, vancomycin through 6/8 per ID recommendations. Not candidate for outpatient IV antibiotics due to polysubstance abuse, lack of social supports; has not been able to be placed by TOC. No evidence of complications at this time, urged to continue ambulation/OOB as much as tolerable. - Continue pain control along with bowel regimen.  Continue Robaxin as needed for spasms.  Decreased OxyContin to 10 mg once a day on 09/02/2019. - OOB, Continue regular PT/OT -No recent temperature spikes.  Remains hemodynamically stable.  Will check labs for tomorrow.  Polysubstance and tobacco use: PCP, heroin, cocaine, alcohol binge drinking reported prior to admission. - No longer showing evidence of intoxication or withdrawal.  - Cessation counseling provided.  - Continue empiric thiamine.  - Continue nicotine patch.  Schizophrenia, depression: Quiescent currently.  - Continuebupropion, benztropine,  monthly invega (given 08/10/2019) - Follow up with ACT team at discharge per psychiatry.  Acute toxic encephalopathy: Resolved.  HTN: Well controlled - Continue diltiazem, losartan.   Diabetes mellitus type 2: Well controlled with HbA1c 6.6%.  - Continue SSI for tight control with systemic infection.  Blood sugars stable  GERD:  - Continue PPI  Vitamin B12 deficiency:  - Continue supplementation.  Insomnia:  - Continue trazodone qHS  DVT prophylaxis: Lovenox Code Status: Full Family Communication: None at bedside.  Spoke to significant other on 09/02/2019 on phone Disposition Plan: Status is: Inpatient  Remains inpatient appropriate because:Unsafe d/c plan  Dispo: The patient is from: Home              Anticipated d/c is to: Home              Anticipated d/c date is: 09/05/2019              Patient currently is not medically stable to d/c.  Consultants:   PCCM primary on 4/29  Neurology 5/3  ID 5/7  Psychiatry 5/11  Procedures:   Lumbar puncture  PICC placement  Antimicrobials:  Vancomycin, ceftriaxone through 6/8   Subjective: Patient seen and examined at bedside.  Poor historian.  Complains of intermittent low back pain and right lower extremity pain.  Still able to walk to the bathroom as per him.   Objective: Vitals:   09/03/19 0548 09/03/19 0622 09/03/19 1325 09/04/19 0459  BP: (!) 145/128 (!) 132/96 114/81 112/89  Pulse: 92 92 98 84  Resp: 19  14 16   Temp: 97.7 F (36.5 C)  97.9 F (36.6 C) 98.2 F (36.8 C)  TempSrc:   Oral   SpO2: 97%  98% 98%  Weight:      Height:  Intake/Output Summary (Last 24 hours) at 09/04/2019 0739 Last data filed at 09/04/2019 0423 Gross per 24 hour  Intake 1490 ml  Output --  Net 1490 ml   Filed Weights   08/30/19 1300 08/30/19 1343 09/01/19 0500  Weight: 89.8 kg 90 kg 90.9 kg   Gen: No distress.  Sleepy, does not appear to be in any distress but when he wakes up, he states that he is in pain. Poor  historian.  Looks chronically ill Pulm: Bilateral decreased breath sounds at bases, no wheezing CV: Rate controlled, S1-2 heard GI: Soft, nontender, nondistended.  Bowel sounds heard Ext: No cyanosis, edema or clubbing.  Able to move lower extremities.  Data Reviewed: I have personally reviewed following labs and imaging studies  CBC: Recent Labs  Lab 08/30/19 0407  WBC 6.8  HGB 10.1*  HCT 33.4*  MCV 84.8  PLT 327   Basic Metabolic Panel: Recent Labs  Lab 08/30/19 0407 09/04/19 0252  NA 137  --   K 4.5  --   CL 102  --   CO2 24  --   GLUCOSE 92  --   BUN 20  --   CREATININE 0.89 0.73  CALCIUM 8.8*  --    GFR: Estimated Creatinine Clearance: 119.7 mL/min (by C-G formula based on SCr of 0.73 mg/dL). Liver Function Tests: No results for input(s): AST, ALT, ALKPHOS, BILITOT, PROT, ALBUMIN in the last 168 hours. No results for input(s): LIPASE, AMYLASE in the last 168 hours. No results for input(s): AMMONIA in the last 168 hours. Coagulation Profile: No results for input(s): INR, PROTIME in the last 168 hours. Cardiac Enzymes: No results for input(s): CKTOTAL, CKMB, CKMBINDEX, TROPONINI in the last 168 hours. BNP (last 3 results) No results for input(s): PROBNP in the last 8760 hours. HbA1C: No results for input(s): HGBA1C in the last 72 hours. CBG: Recent Labs  Lab 09/02/19 2049 09/03/19 0740 09/03/19 1210 09/03/19 1643 09/03/19 2156  GLUCAP 117* 94 84 108* 113*   Lipid Profile: No results for input(s): CHOL, HDL, LDLCALC, TRIG, CHOLHDL, LDLDIRECT in the last 72 hours. Thyroid Function Tests: No results for input(s): TSH, T4TOTAL, FREET4, T3FREE, THYROIDAB in the last 72 hours. Anemia Panel: No results for input(s): VITAMINB12, FOLATE, FERRITIN, TIBC, IRON, RETICCTPCT in the last 72 hours. Urine analysis:    Component Value Date/Time   COLORURINE YELLOW 07/25/2019 1805   APPEARANCEUR HAZY (A) 07/25/2019 1805   LABSPEC 1.011 07/25/2019 1805   PHURINE 6.0  07/25/2019 1805   GLUCOSEU NEGATIVE 07/25/2019 1805   HGBUR SMALL (A) 07/25/2019 1805   BILIRUBINUR NEGATIVE 07/25/2019 1805   BILIRUBINUR negative 04/10/2016 1702   KETONESUR NEGATIVE 07/25/2019 1805   PROTEINUR 30 (A) 07/25/2019 1805   UROBILINOGEN 0.2 04/10/2016 1702   UROBILINOGEN 0.2 12/04/2013 1044   NITRITE NEGATIVE 07/25/2019 1805   LEUKOCYTESUR SMALL (A) 07/25/2019 1805   No results found for this or any previous visit (from the past 240 hour(s)).    Radiology Studies: No results found.  Scheduled Meds: . benztropine  1 mg Oral Daily  . buPROPion  300 mg Oral Daily  . diltiazem  240 mg Oral Daily  . enoxaparin (LOVENOX) injection  40 mg Subcutaneous Daily  . folic acid  1 mg Oral Daily  . gabapentin  300 mg Oral BID  . ibuprofen  400 mg Oral TID  . insulin aspart  0-5 Units Subcutaneous QHS  . insulin aspart  0-9 Units Subcutaneous TID WC  . lidocaine  1 patch Transdermal Q24H  . losartan  50 mg Oral Daily  . multivitamin with minerals  1 tablet Oral Daily  . nicotine  21 mg Transdermal Daily  . oxyCODONE  10 mg Oral Daily  . paliperidone  234 mg Intramuscular Q30 days  . pantoprazole  40 mg Oral Daily  . polyethylene glycol  17 g Oral BID  . thiamine  250 mg Oral Daily  . traZODone  50 mg Oral QHS  . vitamin B-12  2,000 mcg Oral Daily   Continuous Infusions: . sodium chloride Stopped (07/28/19 1838)  . cefTRIAXone (ROCEPHIN)  IV 2 g (09/03/19 1024)  . vancomycin 1,000 mg (09/03/19 2148)     LOS: 40 days   Time spent: 15 minutes.  Glade Lloyd, MD [facility] [email or web address] 09/04/2019, 7:39 AM

## 2019-09-04 NOTE — Progress Notes (Signed)
Physical Therapy Treatment Patient Details Name: [name] MRN: [id number] DOB: Feb 17, 1966 Today's Date: 09/04/2019    History of Present Illness 54 yo male admitted with toxic metabolic encephalopathy, bacterial meningitis, unwitness fall 5/2. Hx of recent lumbar sg L3-L5 03/2019, polysubstance abuse, schizophrenia, ETOH abuse, CVA, bipolar d/o, back pain, L ankle fusion 2019, R TKA 2018, asthma.    PT Comments    Pt with high pain this session, reports pain medication hasn't "kick in" yet but agreeable to try therapy. Pt requires increased time with all mobility due to pain, tolerates ambulation in room but not further distance. Cues for TLSO donning appropriately and safety with mobility to avoid tripping on IV line. Pt hopeful to d/c home tomorrow and educated pt on safety with mobility once home and wearing TLSO appropriately. RN notified of pt's high pain with mobility. Patient will benefit from continued physical therapy in hospital and recommendations below to increase strength, balance, endurance for safe ADLs and gait.   Follow Up Recommendations  Home health PT;Supervision/Assistance - 24 hour     Equipment Recommendations  None recommended by PT    Recommendations for Other Services       Precautions / Restrictions Precautions Precautions: Fall Precaution Comments: impulsive Required Braces or Orthoses: Spinal Brace Spinal Brace: Thoracolumbosacral orthotic;Applied in sitting position Restrictions Weight Bearing Restrictions: No Other Position/Activity Restrictions: WBAT    Mobility  Bed Mobility Overal bed mobility: Modified Independent  General bed mobility comments: increased time, independent with supine to sit and no bedrail or elevated HOB, reports pain with coming to sitting at EOB  Transfers Overall transfer level: Needs assistance Equipment used: Rolling walker (2 wheeled) Transfers: Sit to/from [employer] Sit to Stand:  Supervision Stand pivot transfers: Supervision  General transfer comment: cues for line management and activating BLE to rise from seated surface, slightly impulsive once rising to pivot quickly over to recliner due to pain but able to slow down and follow commands appropriately  Ambulation/Gait Ambulation/Gait assistance: Supervision  Assistive device: Rolling walker (2 wheeled) Gait Pattern/deviations: Step-through pattern;Decreased stride length  General Gait Details: limited to ambulation in room 2* pain in bil hips and knees, received pain medication but hasn't "kick in"   Stairs             Wheelchair Mobility    Modified Rankin (Stroke Patients Only)       Balance Overall balance assessment: Needs assistance;History of Falls Sitting-balance support: No upper extremity supported;Feet supported Sitting balance-[name]: Good Sitting balance - Comments: seated EOB   Standing balance support: Bilateral upper extremity supported;During functional activity Standing balance-[name]: Poor Standing balance comment: reliant on RW           Cognition Arousal/Alertness: Awake/alert Behavior During Therapy: WFL for tasks assessed/performed Overall Cognitive Status: Within Functional Limits for tasks assessed      General Comments: Alert and oriented, requires cues for TLSO bracing      Exercises General Exercises - Lower Extremity Long Arc Quad: Seated;Strengthening;Both;5 reps Hip Flexion/Marching: Seated;Strengthening;Both;5 reps    General Comments        Pertinent Vitals/Pain Pain Assessment: Faces Faces Pain Scale: Hurts even more Pain Location: bil hips and knees Pain Descriptors / Indicators: Grimacing;Guarding Pain Intervention(s): Limited activity within patient's tolerance;Monitored during session;Premedicated before session;Repositioned    Home Living                 Prior Function  PT Goals (current goals can now be  found in the care plan section) Acute Rehab PT Goals Patient Stated Goal: to get better PT Goal Formulation: With patient Time For Goal Achievement: 09/04/19 Potential to Achieve Goals: Good Progress towards PT goals: Progressing toward goals    Frequency    Min 3X/week      PT Plan Current plan remains appropriate    Co-evaluation              AM-PAC PT "6 Clicks" Mobility   Outcome Measure  Help needed turning from your back to your side while in a flat bed without using bedrails?: None Help needed moving from lying on your back to sitting on the side of a flat bed without using bedrails?: None Help needed moving to and from a bed to a chair (including a wheelchair)?: A Little Help needed standing up from a chair using your arms (e.g., wheelchair or bedside chair)?: A Little Help needed to walk in hospital room?: A Little Help needed climbing 3-5 steps with a railing? : A Little 6 Click Score: 20    End of Session Equipment Utilized During Treatment: Back brace Activity Tolerance: Patient limited by pain Patient left: with call bell/phone within reach;in chair;with chair alarm set Nurse Communication: Mobility status;Other (comment)(high pain with mobility) PT Visit Diagnosis: History of falling (Z91.81);Difficulty in walking, not elsewhere classified (R26.2);Pain;Muscle weakness (generalized) (M62.81)     Time: 1610-9604 PT Time Calculation (min) (ACUTE ONLY): 18 min  Charges:  $Therapeutic Activity: 8-22 mins                      Tori Reeva Davern PT, DPT 09/04/19, 12:43 PM

## 2019-09-05 LAB — CBC
HCT: 33.8 % — ABNORMAL LOW (ref 39.0–52.0)
Hemoglobin: 10 g/dL — ABNORMAL LOW (ref 13.0–17.0)
MCH: 25.4 pg — ABNORMAL LOW (ref 26.0–34.0)
MCHC: 29.6 g/dL — ABNORMAL LOW (ref 30.0–36.0)
MCV: 85.8 fL (ref 80.0–100.0)
Platelets: 329 10*3/uL (ref 150–400)
RBC: 3.94 MIL/uL — ABNORMAL LOW (ref 4.22–5.81)
RDW: 21 % — ABNORMAL HIGH (ref 11.5–15.5)
WBC: 6.8 10*3/uL (ref 4.0–10.5)
nRBC: 0 % (ref 0.0–0.2)

## 2019-09-05 LAB — GLUCOSE, CAPILLARY
Glucose-Capillary: 139 mg/dL — ABNORMAL HIGH (ref 70–99)
Glucose-Capillary: 149 mg/dL — ABNORMAL HIGH (ref 70–99)

## 2019-09-05 LAB — COMPREHENSIVE METABOLIC PANEL
ALT: 11 U/L (ref 0–44)
AST: 18 U/L (ref 15–41)
Albumin: 3.7 g/dL (ref 3.5–5.0)
Alkaline Phosphatase: 170 U/L — ABNORMAL HIGH (ref 38–126)
Anion gap: 12 (ref 5–15)
BUN: 14 mg/dL (ref 6–20)
CO2: 23 mmol/L (ref 22–32)
Calcium: 8.9 mg/dL (ref 8.9–10.3)
Chloride: 101 mmol/L (ref 98–111)
Creatinine, Ser: 0.71 mg/dL (ref 0.61–1.24)
GFR calc Af Amer: >60 mL/min (ref >60)
GFR calc non Af Amer: >60 mL/min (ref >60)
Glucose, Bld: 100 mg/dL — ABNORMAL HIGH (ref 70–99)
Potassium: 4.2 mmol/L (ref 3.5–5.1)
Sodium: 136 mmol/L (ref 135–145)
Total Bilirubin: 0.4 mg/dL (ref 0.3–1.2)
Total Protein: 6.7 g/dL (ref 6.5–8.1)

## 2019-09-05 LAB — MAGNESIUM: Magnesium: 2 mg/dL (ref 1.7–2.4)

## 2019-09-05 LAB — C-REACTIVE PROTEIN: CRP: 0.7 mg/dL (ref <1.0)

## 2019-09-05 MED ORDER — POLYETHYLENE GLYCOL 3350 17 G PO PACK: 17.0000 g | PACK | Freq: Two times a day (BID) | ORAL | 0 refills | Status: DC

## 2019-09-05 MED ORDER — THIAMINE HCL 100 MG PO TABS
100.0000 mg | ORAL_TABLET | Freq: Every day | ORAL | 0 refills | Status: DC
Start: 2019-09-05 — End: 2021-10-17

## 2019-09-05 MED ORDER — OXYCODONE HCL 5 MG PO TABS: 5.0000 mg | ORAL_TABLET | Freq: Four times a day (QID) | ORAL | 0 refills | Status: DC | PRN

## 2019-09-05 MED ORDER — FOLIC ACID 1 MG PO TABS: 1.0000 mg | ORAL_TABLET | Freq: Every day | ORAL | 0 refills | Status: DC

## 2019-09-05 MED ORDER — DILTIAZEM HCL ER COATED BEADS 240 MG PO CP24: 240.0000 mg | ORAL_CAPSULE | Freq: Every day | ORAL | 0 refills | Status: DC

## 2019-09-05 MED ORDER — OXYCODONE HCL ER 10 MG PO T12A: 10.0000 mg | EXTENDED_RELEASE_TABLET | Freq: Every day | ORAL | 0 refills | Status: DC

## 2019-09-05 MED ORDER — CYANOCOBALAMIN 2000 MCG PO TABS: 2000.0000 ug | ORAL_TABLET | Freq: Every day | ORAL | 0 refills | Status: AC

## 2019-09-05 MED ORDER — METHOCARBAMOL 750 MG PO TABS: 750.0000 mg | ORAL_TABLET | Freq: Four times a day (QID) | ORAL | 0 refills | Status: DC | PRN

## 2019-09-05 MED ORDER — XTAMPZA ER 9 MG PO C12A: 9.0000 mg | EXTENDED_RELEASE_CAPSULE | Freq: Two times a day (BID) | ORAL | 0 refills | Status: DC

## 2019-09-05 MED ORDER — IBUPROFEN 400 MG PO TABS: 400.0000 mg | ORAL_TABLET | Freq: Three times a day (TID) | ORAL | 0 refills | Status: DC

## 2019-09-05 MED ORDER — LIDOCAINE 5 % EX PTCH: 1.0000 | MEDICATED_PATCH | CUTANEOUS | 0 refills | Status: DC

## 2019-09-05 MED ORDER — LOSARTAN POTASSIUM 50 MG PO TABS: 50.0000 mg | ORAL_TABLET | Freq: Every day | ORAL | 0 refills | Status: DC

## 2019-09-05 NOTE — Plan of Care (Signed)
Patient improved from initial baseline per chart. Will provide education on disease management and medications at discharge.

## 2019-09-05 NOTE — Discharge Summary (Addendum)
Physician Discharge Summary  [name] ZOX:[id number] DOB: [birth year] DOA: 07/25/2019  PCP: [name], MD  Admit date: 07/25/2019 Discharge date: 09/05/2019  Admitted From: Home Disposition: Home  Recommendations for Outpatient Follow-up:  1. Follow up with PCP in 1 week with repeat CBC/BMP 2. Outpatient follow-up with ID 3. Recommend outpatient follow-up with psychiatry and pain management 4. Follow up in ED if symptoms worsen or new appear   Home Health: No Equipment/Devices: None  Discharge Condition: Stable CODE STATUS: Full Diet recommendation: Heart healthy/carb modified  Brief/Interim Summary: 54 y.o. male with a history of lumbar laminectomy/fusion, polysubstance abuse, schizophrenia, CVA, asthma, and GERD who was brought to the ED 07/25/2019 by his girlfriend due to lethargy. He was tachycardic and appeared dehydrated, reporting taking many substances the prior evening. He was initially admitted to the ICU for toxic encephalopathy, possible EtOH withdrawal on precedex and empirically treated for meningitis with resolution of fever.Further work-up with CT of the lumbar spine showed osteomyelitis/discitis with epidural phlegmon not amenable to drainage.Infectious disease recommended 6 weeks of intravenous antibiotic therapy with ceftriaxone and vancomycin (final day 09/05/2019). Patient has completed 6 weeks of IV antibiotics today.  He is still having some intermittent pains but is able to ambulate and walk to the bathroom okay.  Will be discharged home with home health PT/RN with outpatient follow-up with PCP and ID.  Discharge Diagnoses:   Acute lumbar and thoracic spine osteomyelitis/discitis. - Not candidate for outpatient IV antibiotics due to polysubstance abuse, lack of social supports; has not been able to be placed by TOC. No evidence of complications at this time, urged to continue ambulation/OOB as much as tolerable. -Patient has completed 6 weeks of IV  Rocephin and vancomycin today as per ID recommendations. - No recent temperature spikes.  Remains hemodynamically stable.    Labs stable today. -Remove PICC line today. -Discharge home today with outpatient follow-up with PCP/ID with few days of xtampza 9 mg BID and oxycodone as needed for pain along with gabapentin/ibuprofen/Robaxin and lidocaine patch.  Recommend outpatient follow-up with PCP and/or pain management for continued pain medications.  Of note, I was notified that patient's insurance would not cover OxyContin.  polysubstance and tobacco use: PCP, heroin, cocaine, alcohol binge drinking reported prior to admission. - No longer showing evidence of intoxication or withdrawal.  - Cessation counseling provided.  - Continue thiamine, folic acid.  Schizophrenia, depression: Quiescent currently.  - Continuebupropion, benztropine, monthly invega (given 08/10/2019) - Follow up with ACT team at discharge per psychiatry.  Acute toxic encephalopathy: Resolved.  HTN: Well controlled - Continue diltiazem, losartan.   Diabetes mellitus type 2: Well controlled with HbA1c 6.6%.  -Carb modified diet.  Outpatient follow-up  GERD:  - Continue PPI  Vitamin B12 deficiency:  - Continue supplementation.  Insomnia:  - Continue outpatient regimen.  Follow-up with PCP/psychiatry  Discharge Instructions  Discharge Instructions    Ambulatory referral to Infectious Disease   Complete by: As directed    Discitis followup   Diet - low sodium heart healthy   Complete by: As directed    Diet Carb Modified   Complete by: As directed    Increase activity slowly   Complete by: As directed      Allergies as of 09/05/2019      Reactions   Chlorhexidine Itching, Rash      Medication List    STOP taking these medications   amLODipine 2.5 MG tablet Commonly known as: NORVASC   docusate sodium 100  MG capsule Commonly known as: COLACE   meloxicam 15 MG tablet Commonly known as:  MOBIC   ondansetron 4 MG disintegrating tablet Commonly known as: Zofran ODT   sucralfate 1 g tablet Commonly known as: CARAFATE   SUMAtriptan 50 MG tablet Commonly known as: IMITREX     TAKE these medications   albuterol 108 (90 Base) MCG/ACT inhaler Commonly known as: VENTOLIN HFA Inhale 1 puff into the lungs 2 (two) times daily as needed for wheezing or shortness of breath. What changed: how much to take   benztropine 1 MG tablet Commonly known as: COGENTIN Take 1 mg by mouth daily.   buPROPion 300 MG 24 hr tablet Commonly known as: WELLBUTRIN XL Take 300 mg by mouth daily.   cetirizine 10 MG tablet Commonly known as: ZYRTEC TAKE 1 TABLET BY MOUTH ONCE DAILY. What changed: additional instructions   cyanocobalamin 2000 MCG tablet Take 1 tablet (2,000 mcg total) by mouth daily.   diltiazem 240 MG 24 hr capsule Commonly known as: CARDIZEM CD Take 1 capsule (240 mg total) by mouth daily.   folic acid 1 MG tablet Commonly known as: FOLVITE Take 1 tablet (1 mg total) by mouth daily.   gabapentin 300 MG capsule Commonly known as: NEURONTIN TAKE 1 CAPSULE BY MOUTH 2 TIMES DAILY What changed: See the new instructions.   ibuprofen 400 MG tablet Commonly known as: ADVIL Take 1 tablet (400 mg total) by mouth 3 (three) times daily. What changed:   medication strength  how much to take  when to take this  reasons to take this   Invega Trinza 819 MG/2.625ML injection Generic drug: Paliperidone Palmitate ER Inject 819 mg into the skin every 3 (three) months.   lidocaine 5 % Commonly known as: LIDODERM Place 1 patch onto the skin daily. Remove & Discard patch within 12 hours or as directed by MD   losartan 50 MG tablet Commonly known as: COZAAR Take 1 tablet (50 mg total) by mouth daily. What changed:   medication strength  how much to take   melatonin 5 MG Tabs Take 5 mg by mouth at bedtime.   methocarbamol 750 MG tablet Commonly known as:  ROBAXIN Take 1 tablet (750 mg total) by mouth every 6 (six) hours as needed for muscle spasms.   mirtazapine 15 MG tablet Commonly known as: REMERON Take 15 mg by mouth at bedtime.   omeprazole 40 MG capsule Commonly known as: PRILOSEC TAKE 1 CAPSULE(40 MG) BY MOUTH DAILY What changed:   how much to take  how to take this  when to take this  additional instructions   oxyCODONE 5 MG immediate release tablet Commonly known as: Oxy IR/ROXICODONE Take 1 tablet (5 mg total) by mouth every 6 (six) hours as needed for moderate pain.   polyethylene glycol 17 g packet Commonly known as: MIRALAX / GLYCOLAX Take 17 g by mouth 2 (two) times daily.   thiamine 100 MG tablet Take 1 tablet (100 mg total) by mouth daily.   Vitrum Senior Tabs Take 1 tablet by mouth daily.   Xtampza ER 9 MG C12a Generic drug: oxyCODONE ER Take 9 mg by mouth in the morning and at bedtime.            Durable Medical Equipment  (From admission, onward)         Start     Ordered   09/05/19 1021  For home use only DME 4 wheeled rolling walker with seat  Once  Question:  Patient needs a walker to treat with the following condition  Answer:  Fear for personal safety   09/05/19 1020          Follow-up Information    [name], MD. Schedule an appointment as soon as possible for a visit in 1 week(s).   Specialty: Family Medicine Why: with repeat cbc/bmp  Contact information: [address] [place] [address] [phone]        psychiatrist. Schedule an appointment as soon as possible for a visit in 1 week(s).        Judyann Munson, MD. Schedule an appointment as soon as possible for a visit in 1 week(s).   Specialty: Infectious Diseases Contact information: [address] [place] [address] [phone]          Allergies  Allergen Reactions  . Chlorhexidine Itching and Rash     Consultations:  PCCM/neurology/ID/psychiatry   Procedures/Studies: DG Abd 1 View  Result Date: 08/12/2019 CLINICAL DATA:  Nausea and vomiting EXAM: ABDOMEN - 1 VIEW COMPARISON:  Plain film of the abdomen dated 04/10/2016. FINDINGS: Single view of the abdomen is provided. Moderate amount of gas and stool throughout the colon. No dilated small bowel loops appreciated. No evidence of abnormal fluid collection or free intraperitoneal air seen. Fixation hardware within the lower lumbar spine. IMPRESSION: 1. Fairly large amount of gas throughout the colon raising the possibility of colonic ileus. Moderate amount of stool in the RIGHT colon. 2. No dilated small bowel loops seen. Electronically Signed   By: Bary Richard M.D.   On: 08/12/2019 06:38   MR THORACIC SPINE WO CONTRAST  Result Date: 08/10/2019 CLINICAL DATA:  Worsening back pain EXAM: MRI THORACIC SPINE WITHOUT CONTRAST TECHNIQUE: Multiplanar, multisequence MR imaging of the thoracic spine was performed. No intravenous contrast was administered. COMPARISON:  Lumbar spine MRI 08/04/2019 FINDINGS: The examination was terminated prior to completion due to patient discomfort. Four sequences are provided. There is no sagittal T2-weighted imaging. Alignment: Normal Vertebrae: There is abnormal signal within the L1 vertebral body, incompletely visualized. The thoracic vertebral bodies are normal. Cord:  Normal signal and morphology. Paraspinal and other soft tissues: Negative Disc levels: There is a thin hyperintense T1-weighted signal collection along the dorsal aspect of the thoracic vertebral bodies from T7-T12, favored to represent distension of the epidural venous plexus. There is no spinal canal stenosis. No neural foraminal stenosis. IMPRESSION: 1. Incomplete examination due to patient pain and inability to tolerate the full length of the study. No intravenous contrast material was administered. 2. Incompletely visualized findings of  osteomyelitis at L1. 3. Thin, hyperintense T1-weighted signal collection along the dorsal aspect of the thoracic vertebral bodies from T7-T12 likely indicates distension of the epidural venous plexus. If the patient can return for postcontrast sequences, this might be helpful to differentiate this from a epidural phlegmon or abscess. Electronically Signed   By: Deatra Robinson M.D.   On: 08/10/2019 19:07   MR THORACIC SPINE W CONTRAST  Result Date: 08/11/2019 CLINICAL DATA:  Mid back pain. L1-2 discitis-osteomyelitis. EXAM: MRI THORACIC SPINE WITH CONTRAST TECHNIQUE: Multiplanar, multisequence MR imaging of the thoracic spine was performed following the administration of intravenous contrast. COMPARISON:  Incomplete thoracic spine MRI 08/10/2019. Lumbar spine MRI 08/04/2019. FINDINGS: Alignment:  Normal. Vertebrae: Known L1-2 discitis-osteomyelitis is incompletely evaluated on this thoracic study. As seen on yesterday's incomplete noncontrast thoracic spine MRI, there is abnormal T1 hyperintensity in the ventral epidural space from T8-L1. This is  also T2 hyperintense and does not suppress on the STIR sequence from yesterday's MRI, and there is abnormal epidural enhancement at these levels which is greatest ventrally and measures up to 5 mm in thickness resulting in moderate spinal stenosis which is greatest at T10-11. No discrete epidural fluid collection is identified. Cord: Mild lower thoracic spinal cord flattening due to the epidural process without cord edema. Paraspinal and other soft tissues: Partially visualized paraspinal inflammation at L1-2. Patulous esophagus. Disc levels: Small central disc protrusions at T6-7 and T7-8 without significant stenosis. IMPRESSION: Abnormal epidural enhancement from T8-L1 favored to reflect phlegmon related to L1-2 discitis with result in moderate spinal stenosis. No discrete epidural abscess identified. Electronically Signed   By: Sebastian Ache M.D.   On: 08/11/2019 20:23        Subjective: Patient seen and examined at bedside.  Complains of intermittent low back pain and right lower extremity pain but is able to walk to the bathroom.  He thinks that he is ready for discharge today.  No overnight fever or vomiting reported.  Discharge Exam: Vitals:   09/04/19 2100 09/05/19 0551  BP: 113/90 (!) 122/91  Pulse: (!) 103 95  Resp: 20 20  Temp: 98.7 F (37.1 C) 97.7 F (36.5 C)  SpO2: 98% 97%    General: Pt is alert, awake, not in acute distress.  Poor historian. Cardiovascular: Currently rate controlled, S1/S2 + Respiratory: bilateral decreased breath sounds at bases Abdominal: Soft, NT, ND, bowel sounds + Extremities: no edema, no cyanosis    The results of significant diagnostics from this hospitalization (including imaging, microbiology, ancillary and laboratory) are listed below for reference.     Microbiology: No results found for this or any previous visit (from the past 240 hour(s)).   Labs: BNP (last 3 results) Recent Labs    07/25/19 2346  BNP 49.1   Basic Metabolic Panel: Recent Labs  Lab 08/30/19 0407 09/04/19 0252 09/05/19 0341  NA 137  --  136  K 4.5  --  4.2  CL 102  --  101  CO2 24  --  23  GLUCOSE 92  --  100*  BUN 20  --  14  CREATININE 0.89 0.73 0.71  CALCIUM 8.8*  --  8.9  MG  --   --  2.0   Liver Function Tests: Recent Labs  Lab 09/05/19 0341  AST 18  ALT 11  ALKPHOS 170*  BILITOT 0.4  PROT 6.7  ALBUMIN 3.7   No results for input(s): LIPASE, AMYLASE in the last 168 hours. No results for input(s): AMMONIA in the last 168 hours. CBC: Recent Labs  Lab 08/30/19 0407 09/05/19 0341  WBC 6.8 6.8  HGB 10.1* 10.0*  HCT 33.4* 33.8*  MCV 84.8 85.8  PLT 327 329   Cardiac Enzymes: No results for input(s): CKTOTAL, CKMB, CKMBINDEX, TROPONINI in the last 168 hours. BNP: Invalid input(s): POCBNP CBG: Recent Labs  Lab 09/04/19 1216 09/04/19 1658 09/04/19 2102 09/05/19 0731 09/05/19 1207  GLUCAP  111* 86 126* 139* 149*   D-Dimer No results for input(s): DDIMER in the last 72 hours. Hgb A1c No results for input(s): HGBA1C in the last 72 hours. Lipid Profile No results for input(s): CHOL, HDL, LDLCALC, TRIG, CHOLHDL, LDLDIRECT in the last 72 hours. Thyroid function studies No results for input(s): TSH, T4TOTAL, T3FREE, THYROIDAB in the last 72 hours.  Invalid input(s): FREET3 Anemia work up No results for input(s): VITAMINB12, FOLATE, FERRITIN, TIBC, IRON, RETICCTPCT in the last  72 hours. Urinalysis    Component Value Date/Time   COLORURINE YELLOW 07/25/2019 1805   APPEARANCEUR HAZY (A) 07/25/2019 1805   LABSPEC 1.011 07/25/2019 1805   PHURINE 6.0 07/25/2019 1805   GLUCOSEU NEGATIVE 07/25/2019 1805   HGBUR SMALL (A) 07/25/2019 1805   BILIRUBINUR NEGATIVE 07/25/2019 1805   BILIRUBINUR negative 04/10/2016 1702   KETONESUR NEGATIVE 07/25/2019 1805   PROTEINUR 30 (A) 07/25/2019 1805   UROBILINOGEN 0.2 04/10/2016 1702   UROBILINOGEN 0.2 12/04/2013 1044   NITRITE NEGATIVE 07/25/2019 1805   LEUKOCYTESUR SMALL (A) 07/25/2019 1805   Sepsis Labs Invalid input(s): PROCALCITONIN,  WBC,  LACTICIDVEN Microbiology No results found for this or any previous visit (from the past 240 hour(s)).   Time coordinating discharge: 35 minutes  SIGNED:   Glade Lloyd, MD  [facility] 09/05/2019, 1:29 PM

## 2019-09-05 NOTE — Progress Notes (Signed)
Occupational Therapy Treatment Patient Details Name: [name] MRN: [id number] DOB: 01-19-1966 Today's Date: 09/05/2019    History of present illness 54 yo male admitted with toxic metabolic encephalopathy, bacterial meningitis, unwitness fall 5/2. Hx of recent lumbar sg L3-L5 03/2019, polysubstance abuse, schizophrenia, ETOH abuse, CVA, bipolar d/o, back pain, L ankle fusion 2019, R TKA 2018, asthma.   OT comments  Pt plans to go home this day! Pt will need min A athome in which he says wife will provide.  Follow Up Recommendations  No OT follow up    Equipment Recommendations  None recommended by OT    Recommendations for Other Services      Precautions / Restrictions Precautions Precautions: Fall Precaution Comments: impulsive Required Braces or Orthoses: Spinal Brace Spinal Brace: Thoracolumbosacral orthotic;Applied in sitting position Restrictions Weight Bearing Restrictions: No Other Position/Activity Restrictions: WBAT       Mobility Bed Mobility Overal bed mobility: Modified Independent             General bed mobility comments: increased time, independent with supine to sit and no bedrail or elevated HOB, reports pain with coming to sitting at EOB  Transfers Overall transfer level: Needs assistance Equipment used: Rolling walker (2 wheeled) Transfers: Sit to/from [employer] Sit to Stand: Min guard Stand pivot transfers: Min guard            Balance Overall balance assessment: Needs assistance;History of Falls Sitting-balance support: No upper extremity supported;Feet supported Sitting balance-[name]: Good Sitting balance - Comments: seated EOB   Standing balance support: Bilateral upper extremity supported;During functional activity Standing balance-[name]: Poor Standing balance comment: reliant on RW                           ADL either performed or assessed with clinical judgement   ADL Overall ADL's  : Needs assistance/impaired Eating/Feeding: Set up;Sitting   Grooming: Wash/dry hands;Wash/dry face;Supervision/safety;Standing   Upper Body Bathing: Set up   Lower Body Bathing: Minimal assistance;Sit to/from stand;Cueing for safety   Upper Body Dressing : Set up;Sitting   Lower Body Dressing: Minimal assistance;Sit to/from stand;Cueing for safety;Cueing for sequencing                 General ADL Comments: plan is to DC home this day.  Wife will A as needed     Vision Patient Visual Report: No change from baseline            Cognition Arousal/Alertness: Awake/alert Behavior During Therapy: WFL for tasks assessed/performed Overall Cognitive Status: Within Functional Limits for tasks assessed                                 General Comments: Alert and oriented, requires cues for TLSO bracing                   Pertinent Vitals/ Pain       Pain Assessment: No/denies pain     Prior Functioning/Environment              Frequency  Min 1X/week        Progress Toward Goals  OT Goals(current goals can now be found in the care plan section)     Acute Rehab OT Goals Patient Stated Goal: to get better OT Goal Formulation: With patient Time For Goal Achievement: 09/07/19 Potential to Achieve Goals: Good  Plan  Discharge plan remains appropriate       AM-PAC OT "6 Clicks" Daily Activity     Outcome Measure   Help from another person eating meals?: None Help from another person taking care of personal grooming?: A Little Help from another person toileting, which includes using toliet, bedpan, or urinal?: A Little Help from another person bathing (including washing, rinsing, drying)?: A Little Help from another person to put on and taking off regular upper body clothing?: A Little Help from another person to put on and taking off regular lower body clothing?: A Little 6 Click Score: 19    End of Session Equipment Utilized During  Treatment: Rolling walker;Back brace  OT Visit Diagnosis: Unsteadiness on feet (R26.81);Pain Pain - Right/Left: Right Pain - part of body: Hip   Activity Tolerance Patient tolerated treatment well   Patient Left with call bell/phone within reach;in chair;with chair alarm set   Nurse Communication Mobility status        Time: 2841-3244 OT Time Calculation (min): 14 min  Charges: OT General Charges $OT Visit: 1 Visit OT Treatments $Self Care/Home Management : 8-22 mins  Lise Auer, OT Acute Rehabilitation Services [phone] Office- [phone]      Jacek Colson, Karin Golden D 09/05/2019, 3:51 PM

## 2019-09-05 NOTE — TOC Transition Note (Addendum)
Transition of Care [facility]) - CM/SW Discharge Note   Patient Details  Name: [name] MRN: [id number] Date of Birth: 09/30/1965  [facility] CM/SW Contact:  Ross Ludwig, LCSW Phone Number: 09/05/2019, 12:07 PM   Clinical Narrative:     CSW attempted to find home health agency that can accept patient.  CS[facility] contacted [facility], [facility], [facility], [facility], [facility], and [facility], none of the home health agencies will accept patient.  CSW was informed patient will need a rollator walker, CSW was able to contact Zach at [facility] who are are abe to provide it for patient.  CSW was also informed that patient states he does not have a ride back home.  CSW contacted [facility], to find out if patient is set up with Medicaid transportation, CSW was informed that he does, CSW was able to set up Medicaid transportation to pick patient up at 3:15pm today.  Patient will be discharging back home, without home health due to lack of agency that is able to accept patient.  Patient is currently followed by ACT team, CSW contacted ACT team to inform them that patient is discharging today.  ACT team requested that the discharge summary be faxed to [facility] at [phone].  CSW signing off for now.  Final next level of care: Home/Self Care Barriers to Discharge: Barriers Resolved   Patient Goals and CMS Choice Patient states their goals for this hospitalization and ongoing recovery are:: To return back home. CMS Medicare.gov Compare Post Acute Care list provided to:: Patient Choice offered to / list presented to : Patient  Discharge Placement    Patient discharging back home.                   Discharge Plan and Services   Discharge Planning Services: CM Consult Post Acute Care Choice: Home Health          DME Arranged: Walker rolling with seat DME Agency: [employer] Date DME Agency Contacted: 09/05/19 Time DME Agency Contacted:  1100 Representative spoke with at DME Agency: [facility] HH Arranged: (Unable to find a home health agency due to history of polysubstance abuse.) HH Agency: Other - See comment(Unable to find a [place] agency that will accept patient due to history of substance abuse.)        Social Determinants of Health (SDOH) Interventions     Readmission Risk Interventions Readmission Risk Prevention Plan 08/21/2019  Transportation Screening Complete  Medication Review [employer]) Complete  HRI or [place] Complete  SW Recovery Care/Counseling Consult Complete  [place] Not Applicable  Some recent data might be hidden

## 2019-09-05 NOTE — Plan of Care (Signed)
  Problem: Education: Goal: Knowledge of General Education information will improve Description: Including pain rating scale, medication(s)/side effects and non-pharmacologic comfort measures Outcome: Adequate for Discharge   Problem: Health Behavior/Discharge Planning: Goal: Ability to manage health-related needs will improve Outcome: Adequate for Discharge   Problem: Clinical Measurements: Goal: Ability to maintain clinical measurements within normal limits will improve Outcome: Adequate for Discharge Goal: Will remain free from infection Outcome: Adequate for Discharge Goal: Diagnostic test results will improve Outcome: Adequate for Discharge Goal: Respiratory complications will improve Outcome: Adequate for Discharge Goal: Cardiovascular complication will be avoided Outcome: Adequate for Discharge   Problem: Activity: Goal: Risk for activity intolerance will decrease Outcome: Adequate for Discharge   Problem: Coping: Goal: Level of anxiety will decrease Outcome: Adequate for Discharge   Problem: Pain Managment: Goal: General experience of comfort will improve Outcome: Adequate for Discharge   Problem: Safety: Goal: Ability to remain free from injury will improve Outcome: Adequate for Discharge

## 2019-09-12 ENCOUNTER — Inpatient Hospital Stay: Payer: Medicare Other | Admitting: Internal Medicine

## 2019-09-12 NOTE — Progress Notes (Deleted)
NEUROLOGY CONSULTATION NOTE  [name] MRN: [id number] DOB: [birth year]  Referring provider: Zonia Kief, PA-C Primary care provider: [name], MD  Reason for consult:  Unsteady gait, falls  HISTORY OF PRESENT ILLNESS: [name]. [name] is a 54 year old **-handed male who presents for falls.  History supplemented by orthopedics and hospital notes.  He underwent L3-4 decompression and L4-5 fusion in January 2021.  ***. Left foot drop.  3/5 left DF.  History of ankle fusion.   He was admitted to [facility] on 07/25/2019 for altered mental status/lethargy.  Fever.  Took substances for chronic back pain.  MRI lumbar spine showed osteomyelitis/discitis at L1-2 with epidural phlegmon not amenable to drainage.  6 weeks of IV ceftriaxone and vancomycine.  MRI of brain without contrast showed chronic microvascular ischemic changes and question of abnormal T2 hyperintensity within subcortical sulci.  Follow up MRI brain with contrast showed normal CSF signal on T2 and FLAIR without abnormal leptomeningeal enhancement.  CSF cell count 16 with 23 RBC, protein >600, glucose 46, negative gram stain/culture, fungal culture, HSV, and VDRL.  PAST MEDICAL HISTORY: Past Medical History:  Diagnosis Date  . Anxiety   . Arthritis   . Asthma   . Depression   . Duodenal adenoma   . GERD (gastroesophageal reflux disease)   . Hypertension   . Pneumonia    hx  . Post-traumatic osteoarthritis of left ankle   . Pre-diabetes   . Presence of right artificial knee joint 12/17/2016  . Schizophrenia (HCC)   . Stroke [facility])    2009-or10    PAST SURGICAL HISTORY: Past Surgical History:  Procedure Laterality Date  . ANKLE ARTHROSCOPY Left 10/20/2017   Procedure: ANKLE ARTHROSCOPY;  Surgeon: Nadara Mustard, MD;  Location: [facility] OR;  Service: Orthopedics;  Laterality: Left;  . ANKLE FUSION Left 10/20/2017  . ANKLE FUSION Left 10/20/2017   Procedure: LEFT ANKLE FUSION;  Surgeon: Nadara Mustard, MD;   Location: [facility] OR;  Service: Orthopedics;  Laterality: Left;  . ESOPHAGOGASTRODUODENOSCOPY Left 12/05/2013   Procedure: ESOPHAGOGASTRODUODENOSCOPY (EGD);  Surgeon: Willis Modena, MD;  Location: [facility];  Service: Endoscopy;  Laterality: Left;  . ESOPHAGOGASTRODUODENOSCOPY (EGD) WITH PROPOFOL N/A 02/20/2015   Procedure: ESOPHAGOGASTRODUODENOSCOPY (EGD) WITH PROPOFOL;  Surgeon: Charlott Rakes, MD;  Location: [facility] ENDOSCOPY;  Service: Endoscopy;  Laterality: N/A;  . HARDWARE REMOVAL Left 01/29/2018   Procedure: HARDWARE REMOVAL LEFT ANKLE;  Surgeon: Nadara Mustard, MD;  Location: [facility] OR;  Service: Orthopedics;  Laterality: Left;  . JOINT REPLACEMENT    . TOTAL KNEE ARTHROPLASTY Right 07/21/2016   Procedure: TOTAL KNEE ARTHROPLASTY;  Surgeon: Cammy Copa, MD;  Location: [facility] OR;  Service: Orthopedics;  Laterality: Right;  . UPPER GASTROINTESTINAL ENDOSCOPY      MEDICATIONS: Current Outpatient Medications on File Prior to Visit  Medication Sig Dispense Refill  . albuterol (PROVENTIL HFA;VENTOLIN HFA) 108 (90 Base) MCG/ACT inhaler Inhale 1 puff into the lungs 2 (two) times daily as needed for wheezing or shortness of breath. (Patient taking differently: Inhale 2 puffs into the lungs 2 (two) times daily as needed for wheezing or shortness of breath. ) 1 Inhaler 1  . benztropine (COGENTIN) 1 MG tablet Take 1 mg by mouth daily.    Marland Kitchen buPROPion (WELLBUTRIN XL) 300 MG 24 hr tablet Take 300 mg by mouth daily.    . cetirizine (ZYRTEC) 10 MG tablet TAKE 1 TABLET BY MOUTH ONCE DAILY. (Patient taking differently: Take 10 mg by mouth  daily. No Therapeutic Substitution) 28 tablet 11  . diltiazem (CARDIZEM CD) 240 MG 24 hr capsule Take 1 capsule (240 mg total) by mouth daily. 30 capsule 0  . folic acid (FOLVITE) 1 MG tablet Take 1 tablet (1 mg total) by mouth daily. 30 tablet 0  . gabapentin (NEURONTIN) 300 MG capsule TAKE 1 CAPSULE BY MOUTH 2 TIMES DAILY (Patient taking differently: Take 300 mg by mouth 2  (two) times daily. ) 60 capsule 2  . ibuprofen (ADVIL) 400 MG tablet Take 1 tablet (400 mg total) by mouth 3 (three) times daily. 30 tablet 0  . INVEGA TRINZA 819 MG/2.625ML SUSY Inject 819 mg into the skin every 3 (three) months.  3  . lidocaine (LIDODERM) 5 % Place 1 patch onto the skin daily. Remove & Discard patch within 12 hours or as directed by MD 15 patch 0  . losartan (COZAAR) 50 MG tablet Take 1 tablet (50 mg total) by mouth daily. 30 tablet 0  . Melatonin 5 MG TABS Take 5 mg by mouth at bedtime.    . methocarbamol (ROBAXIN) 750 MG tablet Take 1 tablet (750 mg total) by mouth every 6 (six) hours as needed for muscle spasms. 30 tablet 0  . mirtazapine (REMERON) 15 MG tablet Take 15 mg by mouth at bedtime.    . Multiple Vitamins-Minerals (VITRUM SENIOR) TABS Take 1 tablet by mouth daily.     Marland Kitchen omeprazole (PRILOSEC) 40 MG capsule TAKE 1 CAPSULE(40 MG) BY MOUTH DAILY (Patient taking differently: Take 40 mg by mouth daily. ) 30 capsule 3  . oxyCODONE (OXY IR/ROXICODONE) 5 MG immediate release tablet Take 1 tablet (5 mg total) by mouth every 6 (six) hours as needed for moderate pain. 14 tablet 0  . oxyCODONE ER (XTAMPZA ER) 9 MG C12A Take 9 mg by mouth in the morning and at bedtime. 14 capsule 0  . polyethylene glycol (MIRALAX / GLYCOLAX) 17 g packet Take 17 g by mouth 2 (two) times daily. 30 each 0  . thiamine 100 MG tablet Take 1 tablet (100 mg total) by mouth daily. 30 tablet 0  . vitamin B-12 2000 MCG tablet Take 1 tablet (2,000 mcg total) by mouth daily. 30 tablet 0   No current facility-administered medications on file prior to visit.    ALLERGIES: Allergies  Allergen Reactions  . Chlorhexidine Itching and Rash    FAMILY HISTORY: Family History  Adopted: Yes  Problem Relation Age of Onset  . Colon cancer Neg Hx   . Esophageal cancer Neg Hx   . Rectal cancer Neg Hx   . Stomach cancer Neg Hx    ***.  SOCIAL HISTORY: Social History   Socioeconomic History  . Marital  status: Significant Other    Spouse name: Not on file  . Number of children: Not on file  . Years of education: Not on file  . Highest education level: Not on file  Occupational History  . Not on file  Tobacco Use  . Smoking status: Heavy Tobacco Smoker    Packs/day: 1.50    Years: 38.00    Pack years: 57.00    Types: Cigarettes  . Smokeless tobacco: Never Used  . Tobacco comment: onset age 75; upto 1.5 ppd  Vaping Use  . Vaping Use: Never used  Substance and Sexual Activity  . Alcohol use: Not Currently    Alcohol/week: 3.0 standard drinks    Types: 3 Cans of beer per week    Comment: quit 2015;  formerly up to 2 fifths/day  . Drug use: No  . Sexual activity: Yes    Comment: declined condoms  Other Topics Concern  . Not on file  Social History Narrative  . Not on file   Social Determinants of Health   Financial Resource Strain:   . Difficulty of Paying Living Expenses:   Food Insecurity:   . Worried About Programme researcher, broadcasting/film/video in the Last Year:   . Barista in the Last Year:   Transportation Needs:   . Freight forwarder (Medical):   Marland Kitchen Lack of Transportation (Non-Medical):   Physical Activity:   . Days of Exercise per Week:   . Minutes of Exercise per Session:   Stress:   . Feeling of Stress :   Social Connections:   . Frequency of Communication with Friends and Family:   . Frequency of Social Gatherings with Friends and Family:   . Attends Religious Services:   . Active Member of Clubs or Organizations:   . Attends Banker Meetings:   Marland Kitchen Marital Status:   Intimate Partner Violence:   . Fear of Current or Ex-Partner:   . Emotionally Abused:   Marland Kitchen Physically Abused:   . Sexually Abused:     REVIEW OF SYSTEMS: Constitutional: No fevers, chills, or sweats, no generalized fatigue, change in appetite Eyes: No visual changes, double vision, eye pain Ear, nose and throat: No hearing loss, ear pain, nasal congestion, sore  throat Cardiovascular: No chest pain, palpitations Respiratory:  No shortness of breath at rest or with exertion, wheezes GastrointestinaI: No nausea, vomiting, diarrhea, abdominal pain, fecal incontinence Genitourinary:  No dysuria, urinary retention or frequency Musculoskeletal:  No neck pain, back pain Integumentary: No rash, pruritus, skin lesions Neurological: as above Psychiatric: No depression, insomnia, anxiety Endocrine: No palpitations, fatigue, diaphoresis, mood swings, change in appetite, change in weight, increased thirst Hematologic/Lymphatic:  No purpura, petechiae. Allergic/Immunologic: no itchy/runny eyes, nasal congestion, recent allergic reactions, rashes  PHYSICAL EXAM: *** General: No acute distress.  Patient appears ***-groomed.  *** Head:  Normocephalic/atraumatic Eyes:  fundi examined but not visualized Neck: supple, no paraspinal tenderness, full range of motion Back: No paraspinal tenderness Heart: regular rate and rhythm Lungs: Clear to auscultation bilaterally. Vascular: No carotid bruits. Neurological Exam: Mental status: alert and oriented to person, place, and time, recent and remote memory intact, fund of knowledge intact, attention and concentration intact, speech fluent and not dysarthric, language intact. Cranial nerves: CN I: not tested CN II: pupils equal, round and reactive to light, visual fields intact CN III, IV, VI:  full range of motion, no nystagmus, no ptosis CN V: facial sensation intact CN VII: upper and lower face symmetric CN VIII: hearing intact CN IX, X: gag intact, uvula midline CN XI: sternocleidomastoid and trapezius muscles intact CN XII: tongue midline Bulk & Tone: normal, no fasciculations. Motor:  5/5 throughout *** Sensation:  Pinprick *** temperature *** and vibration sensation intact.  ***. Deep Tendon Reflexes:  2+ throughout, *** toes downgoing.  *** Finger to nose testing:  Without dysmetria.  *** Heel to shin:   Without dysmetria.  *** Gait:  Normal station and stride.  Able to turn and tandem walk. Romberg ***.  IMPRESSION: ***  PLAN: ***  Thank you for allowing me to take part in the care of this patient.  Shon Millet, DO  CC: ***

## 2019-09-14 ENCOUNTER — Ambulatory Visit: Payer: Medicare Other | Admitting: Neurology

## 2019-09-18 ENCOUNTER — Other Ambulatory Visit: Payer: Self-pay

## 2019-09-18 ENCOUNTER — Ambulatory Visit (HOSPITAL_COMMUNITY)
Admission: EM | Admit: 2019-09-18 | Discharge: 2019-09-18 | Disposition: A | Discharge status: Home / Self Care | Payer: Medicare Other | Attending: Family Medicine | Admitting: Family Medicine

## 2019-09-18 ENCOUNTER — Encounter (HOSPITAL_COMMUNITY): Payer: Self-pay

## 2019-09-18 DIAGNOSIS — H6123 Impacted cerumen, bilateral: Secondary | ICD-10-CM

## 2019-09-18 DIAGNOSIS — H9313 Tinnitus, bilateral: Secondary | ICD-10-CM

## 2019-09-18 NOTE — ED Triage Notes (Signed)
C/o ringing in the ears x2-3 months. Reports it comes and goes.

## 2019-09-19 ENCOUNTER — Inpatient Hospital Stay: Payer: Medicare Other | Admitting: Internal Medicine

## 2019-09-20 NOTE — ED Provider Notes (Signed)
[name]   [id number] 09/18/19 Arrival Time: 4481  ASSESSMENT & PLAN:  1. Bilateral impacted cerumen   2. Tinnitus of both ears     Reports relief of symptoms upon flushing ears of wax. Still with reported tinnitus.  Recommend:  Follow-up Information    Schedule an appointment as soon as possible for a visit  with [facility].   Why: To evaluate your hearing. Contact information: [place] [address] [phone]                Reviewed expectations re: course of current medical issues. Questions answered. Outlined signs and symptoms indicating need for more acute intervention. Patient verbalized understanding. After Visit Summary given.   SUBJECTIVE: History from: patient.  [name] is a 54 y.o. male who presents with complaint of bilateral ear fullness along with tinnitus; unsure of duration; ears without drainage; without bleeding. Onset gradual, 2-3 months ago. Recent cold symptoms: none. Fever: no. Overall normal PO intake without n/v. Sick contacts: no. OTC treatment: none.  Social History   Tobacco Use  Smoking Status Heavy Tobacco Smoker   Packs/day: 1.50   Years: 38.00   Pack years: 57.00   Types: Cigarettes  Smokeless Tobacco Never Used  Tobacco Comment   onset age 18; upto 1.5 ppd      OBJECTIVE:  Vitals:   09/18/19 1657  BP: 115/77  Pulse: (!) 118  Resp: 16  Temp: 97.9 F (36.6 C)  TempSrc: Oral  SpO2: 98%    Recheck HR: 96  General appearance: alert; NAD Ear Canal: cerumen bilaterally TM: bilateral: normal appearance after cerumen removal Neck: supple without LAD Lungs: unlabored respirations, cough: absent; no respiratory distress Skin: warm and dry Psychological: alert and cooperative; normal mood and affect  Allergies  Allergen Reactions   Chlorhexidine Itching and Rash    Past Medical History:  Diagnosis Date   Anxiety     Arthritis    Asthma    Depression    Duodenal adenoma    GERD (gastroesophageal reflux disease)    Hypertension    Pneumonia    hx   Post-traumatic osteoarthritis of left ankle    Pre-diabetes    Presence of right artificial knee joint 12/17/2016   Schizophrenia ([place])    Stroke ([place])    2009-or10   Family History  Adopted: Yes  Problem Relation Age of Onset   Colon cancer Neg Hx    Esophageal cancer Neg Hx    Rectal cancer Neg Hx    Stomach cancer Neg Hx    Social History   Socioeconomic History   Marital status: Significant Other    Spouse name: Not on file   Number of children: Not on file   Years of education: Not on file   Highest education level: Not on file  Occupational History   Not on file  Tobacco Use   Smoking status: Heavy Tobacco Smoker    Packs/day: 1.50    Years: 38.00    Pack years: 57.00    Types: Cigarettes   Smokeless tobacco: Never Used   Tobacco comment: onset age 47; upto 1.5 ppd  Vaping Use   Vaping Use: Never used  Substance and Sexual Activity   Alcohol use: Not Currently    Alcohol/week: 3.0 standard drinks    Types: 3 Cans of beer per week    Comment: quit 2015; formerly up to 2 fifths/day   Drug  use: No   Sexual activity: Yes    Comment: declined condoms  Other Topics Concern   Not on file  Social History Narrative   Not on file   Social Determinants of Health   Financial Resource Strain:    Difficulty of Paying Living Expenses:   Food Insecurity:    Worried About Charity fundraiser in the Last Year:    Arboriculturist in the Last Year:   Transportation Needs:    Film/video editor (Medical):    Lack of Transportation (Non-Medical):   Physical Activity:    Days of Exercise per Week:    Minutes of Exercise per Session:   Stress:    Feeling of Stress :   Social Connections:    Frequency of Communication with Friends and Family:    Frequency of Social Gatherings with Friends  and Family:    Attends Religious Services:    Active Member of Clubs or Organizations:    Attends Archivist Meetings:    Marital Status:   Intimate Partner Violence:    Fear of Current or Ex-Partner:    Emotionally Abused:    Physically Abused:    Sexually Abused:             Vanessa Kick, MD 09/20/19 1011

## 2019-10-22 ENCOUNTER — Other Ambulatory Visit: Payer: Self-pay | Admitting: Specialist

## 2019-10-23 NOTE — Telephone Encounter (Signed)
Please advise 

## 2019-11-16 ENCOUNTER — Ambulatory Visit: Payer: Medicare Other | Admitting: Internal Medicine

## 2019-11-23 ENCOUNTER — Ambulatory Visit: Payer: Medicare Other | Admitting: Surgery

## 2019-11-29 ENCOUNTER — Ambulatory Visit: Payer: Medicare Other | Admitting: Internal Medicine

## 2019-12-08 NOTE — Progress Notes (Deleted)
NEUROLOGY CONSULTATION NOTE  [name] MRN: [id number] DOB: [birth year]  Referring provider: [name], MD Primary care provider: [name], MD  Reason for consult:  Multiple falls  HISTORY OF PRESENT ILLNESS: Brian Shieh. [name] is a 54 year old ***-handed male with polysubstance abuse, HTN, schizophrenia and remote history of stroke who presents for multiple falls.  History supplemented by orthopedics and hospital notes.  Patient underwent L4-5 fusion and decompression and left L3-4 microdiscectomy in January 2021 for symptomatic lumbar spinal stenosis with claudication.  ***.    PAST MEDICAL HISTORY: Past Medical History:  Diagnosis Date  . Anxiety   . Arthritis   . Asthma   . Depression   . Duodenal adenoma   . GERD (gastroesophageal reflux disease)   . Hypertension   . Pneumonia    hx  . Post-traumatic osteoarthritis of left ankle   . Pre-diabetes   . Presence of right artificial knee joint 12/17/2016  . Schizophrenia (HCC)   . Stroke [facility])    2009-or10    PAST SURGICAL HISTORY: Past Surgical History:  Procedure Laterality Date  . ANKLE ARTHROSCOPY Left 10/20/2017   Procedure: ANKLE ARTHROSCOPY;  Surgeon: Brian Mustard, MD;  Location: [facility] OR;  Service: Orthopedics;  Laterality: Left;  . ANKLE FUSION Left 10/20/2017  . ANKLE FUSION Left 10/20/2017   Procedure: LEFT ANKLE FUSION;  Surgeon: Brian Mustard, MD;  Location: [facility] OR;  Service: Orthopedics;  Laterality: Left;  . ESOPHAGOGASTRODUODENOSCOPY Left 12/05/2013   Procedure: ESOPHAGOGASTRODUODENOSCOPY (EGD);  Surgeon: Brian Modena, MD;  Location: [facility];  Service: Endoscopy;  Laterality: Left;  . ESOPHAGOGASTRODUODENOSCOPY (EGD) WITH PROPOFOL N/A 02/20/2015   Procedure: ESOPHAGOGASTRODUODENOSCOPY (EGD) WITH PROPOFOL;  Surgeon: Brian Rakes, MD;  Location: [facility]OSCOPY;  Service: Endoscopy;  Laterality: N/A;  . HARDWARE REMOVAL Left 01/29/2018   Procedure: HARDWARE REMOVAL LEFT ANKLE;  Surgeon:  Brian Mustard, MD;  Location: [facility] OR;  Service: Orthopedics;  Laterality: Left;  . JOINT REPLACEMENT    . TOTAL KNEE ARTHROPLASTY Right 07/21/2016   Procedure: TOTAL KNEE ARTHROPLASTY;  Surgeon: Brian Copa, MD;  Location: [facility] OR;  Service: Orthopedics;  Laterality: Right;  . UPPER GASTROINTESTINAL ENDOSCOPY      MEDICATIONS: Current Outpatient Medications on File Prior to Visit  Medication Sig Dispense Refill  . albuterol (PROVENTIL HFA;VENTOLIN HFA) 108 (90 Base) MCG/ACT inhaler Inhale 1 puff into the lungs 2 (two) times daily as needed for wheezing or shortness of breath. (Patient taking differently: Inhale 2 puffs into the lungs 2 (two) times daily as needed for wheezing or shortness of breath. ) 1 Inhaler 1  . benztropine (COGENTIN) 1 MG tablet Take 1 mg by mouth daily.    Marland Kitchen buPROPion (WELLBUTRIN XL) 300 MG 24 hr tablet Take 300 mg by mouth daily.    . cetirizine (ZYRTEC) 10 MG tablet TAKE 1 TABLET BY MOUTH ONCE DAILY. (Patient taking differently: Take 10 mg by mouth daily. No Therapeutic Substitution) 28 tablet 11  . diltiazem (CARDIZEM CD) 240 MG 24 hr capsule Take 1 capsule (240 mg total) by mouth daily. 30 capsule 0  . folic acid (FOLVITE) 1 MG tablet Take 1 tablet (1 mg total) by mouth daily. 30 tablet 0  . gabapentin (NEURONTIN) 300 MG capsule Take 1 capsule (300 mg total) by mouth 2 (two) times daily. 180 capsule 4  . ibuprofen (ADVIL) 400 MG tablet Take 1 tablet (400 mg total) by mouth 3 (three) times daily. 30 tablet 0  . INVEGA  TRINZA 819 MG/2.625ML SUSY Inject 819 mg into the skin every 3 (three) months.  3  . lidocaine (LIDODERM) 5 % Place 1 patch onto the skin daily. Remove & Discard patch within 12 hours or as directed by MD 15 patch 0  . losartan (COZAAR) 50 MG tablet Take 1 tablet (50 mg total) by mouth daily. 30 tablet 0  . Melatonin 5 MG TABS Take 5 mg by mouth at bedtime.    . methocarbamol (ROBAXIN) 750 MG tablet Take 1 tablet (750 mg total) by mouth every 6 (six)  hours as needed for muscle spasms. 30 tablet 0  . mirtazapine (REMERON) 15 MG tablet Take 15 mg by mouth at bedtime.    . Multiple Vitamins-Minerals (VITRUM SENIOR) TABS Take 1 tablet by mouth daily.     Marland Kitchen omeprazole (PRILOSEC) 40 MG capsule TAKE 1 CAPSULE(40 MG) BY MOUTH DAILY (Patient taking differently: Take 40 mg by mouth daily. ) 30 capsule 3  . oxyCODONE (OXY IR/ROXICODONE) 5 MG immediate release tablet Take 1 tablet (5 mg total) by mouth every 6 (six) hours as needed for moderate pain. 14 tablet 0  . oxyCODONE ER (XTAMPZA ER) 9 MG C12A Take 9 mg by mouth in the morning and at bedtime. 14 capsule 0  . polyethylene glycol (MIRALAX / GLYCOLAX) 17 g packet Take 17 g by mouth 2 (two) times daily. 30 each 0  . thiamine 100 MG tablet Take 1 tablet (100 mg total) by mouth daily. 30 tablet 0   No current facility-administered medications on file prior to visit.    ALLERGIES: Allergies  Allergen Reactions  . Chlorhexidine Itching and Rash    FAMILY HISTORY: Family History  Adopted: Yes  Problem Relation Age of Onset  . Colon cancer Neg Hx   . Esophageal cancer Neg Hx   . Rectal cancer Neg Hx   . Stomach cancer Neg Hx    ***.  SOCIAL HISTORY: Social History   Socioeconomic History  . Marital status: Significant Other    Spouse name: Not on file  . Number of children: Not on file  . Years of education: Not on file  . Highest education level: Not on file  Occupational History  . Not on file  Tobacco Use  . Smoking status: Heavy Tobacco Smoker    Packs/day: 1.50    Years: 38.00    Pack years: 57.00    Types: Cigarettes  . Smokeless tobacco: Never Used  . Tobacco comment: onset age 2; upto 1.5 ppd  Vaping Use  . Vaping Use: Never used  Substance and Sexual Activity  . Alcohol use: Not Currently    Alcohol/week: 3.0 standard drinks    Types: 3 Cans of beer per week    Comment: quit 2015; formerly up to 2 fifths/day  . Drug use: No  . Sexual activity: Yes    Comment:  declined condoms  Other Topics Concern  . Not on file  Social History Narrative  . Not on file   Social Determinants of Health   Financial Resource Strain:   . Difficulty of Paying Living Expenses: Not on file  Food Insecurity:   . Worried About Programme researcher, broadcasting/film/video in the Last Year: Not on file  . Ran Out of Food in the Last Year: Not on file  Transportation Needs:   . Lack of Transportation (Medical): Not on file  . Lack of Transportation (Non-Medical): Not on file  Physical Activity:   . Days of Exercise  per Week: Not on file  . Minutes of Exercise per Session: Not on file  Stress:   . Feeling of Stress : Not on file  Social Connections:   . Frequency of Communication with Friends and Family: Not on file  . Frequency of Social Gatherings with Friends and Family: Not on file  . Attends Religious Services: Not on file  . Active Member of Clubs or Organizations: Not on file  . Attends Banker Meetings: Not on file  . Marital Status: Not on file  Intimate Partner Violence:   . Fear of Current or Ex-Partner: Not on file  . Emotionally Abused: Not on file  . Physically Abused: Not on file  . Sexually Abused: Not on file    REVIEW OF SYSTEMS: Constitutional: No fevers, chills, or sweats, no generalized fatigue, change in appetite Eyes: No visual changes, double vision, eye pain Ear, nose and throat: No hearing loss, ear pain, nasal congestion, sore throat Cardiovascular: No chest pain, palpitations Respiratory:  No shortness of breath at rest or with exertion, wheezes GastrointestinaI: No nausea, vomiting, diarrhea, abdominal pain, fecal incontinence Genitourinary:  No dysuria, urinary retention or frequency Musculoskeletal:  No neck pain, back pain Integumentary: No rash, pruritus, skin lesions Neurological: as above Psychiatric: No depression, insomnia, anxiety Endocrine: No palpitations, fatigue, diaphoresis, mood swings, change in appetite, change in weight,  increased thirst Hematologic/Lymphatic:  No purpura, petechiae. Allergic/Immunologic: no itchy/runny eyes, nasal congestion, recent allergic reactions, rashes  PHYSICAL EXAM: *** General: No acute distress.  Patient appears ***-groomed.  *** Head:  Normocephalic/atraumatic Eyes:  fundi examined but not visualized Neck: supple, no paraspinal tenderness, full range of motion Back: No paraspinal tenderness Heart: regular rate and rhythm Lungs: Clear to auscultation bilaterally. Vascular: No carotid bruits. Neurological Exam: Mental status: alert and oriented to person, place, and time, recent and remote memory intact, fund of knowledge intact, attention and concentration intact, speech fluent and not dysarthric, language intact. Cranial nerves: CN I: not tested CN II: pupils equal, round and reactive to light, visual fields intact CN III, IV, VI:  full range of motion, no nystagmus, no ptosis CN V: facial sensation intact CN VII: upper and lower face symmetric CN VIII: hearing intact CN IX, X: gag intact, uvula midline CN XI: sternocleidomastoid and trapezius muscles intact CN XII: tongue midline Bulk & Tone: normal, no fasciculations. Motor:  5/5 throughout *** Sensation:  Pinprick *** temperature *** and vibration sensation intact.  ***. Deep Tendon Reflexes:  2+ throughout, *** toes downgoing.  *** Finger to nose testing:  Without dysmetria.  *** Heel to shin:  Without dysmetria.  *** Gait:  Normal station and stride.  Able to turn and tandem walk. Romberg ***.  IMPRESSION: ***  PLAN: ***  Thank you for allowing me to take part in the care of this patient.  Shon Millet, DO  CC: ***

## 2019-12-11 ENCOUNTER — Ambulatory Visit: Payer: Medicare Other | Admitting: Neurology

## 2019-12-21 ENCOUNTER — Ambulatory Visit: Payer: Medicare Other | Admitting: Specialist

## 2020-01-06 ENCOUNTER — Other Ambulatory Visit: Payer: Self-pay

## 2020-01-06 ENCOUNTER — Inpatient Hospital Stay (HOSPITAL_COMMUNITY)
Admission: EM | Admit: 2020-01-06 | Discharge: 2020-02-20 | DRG: 988 | Disposition: A | Discharge status: Home / Self Care | Payer: Medicare Other | Attending: Internal Medicine | Admitting: Internal Medicine

## 2020-01-06 ENCOUNTER — Encounter (HOSPITAL_COMMUNITY): Payer: Self-pay | Admitting: Emergency Medicine

## 2020-01-06 ENCOUNTER — Emergency Department (HOSPITAL_COMMUNITY): Payer: Medicare Other

## 2020-01-06 DIAGNOSIS — Z981 Arthrodesis status: Secondary | ICD-10-CM | POA: Diagnosis not present

## 2020-01-06 DIAGNOSIS — M4646 Discitis, unspecified, lumbar region: Secondary | ICD-10-CM

## 2020-01-06 DIAGNOSIS — R52 Pain, unspecified: Secondary | ICD-10-CM

## 2020-01-06 DIAGNOSIS — G061 Intraspinal abscess and granuloma: Secondary | ICD-10-CM | POA: Diagnosis not present

## 2020-01-06 DIAGNOSIS — D638 Anemia in other chronic diseases classified elsewhere: Secondary | ICD-10-CM | POA: Diagnosis present

## 2020-01-06 DIAGNOSIS — Y93E1 Activity, personal bathing and showering: Secondary | ICD-10-CM | POA: Diagnosis not present

## 2020-01-06 DIAGNOSIS — M869 Osteomyelitis, unspecified: Secondary | ICD-10-CM

## 2020-01-06 DIAGNOSIS — M4649 Discitis, unspecified, multiple sites in spine: Secondary | ICD-10-CM | POA: Diagnosis present

## 2020-01-06 DIAGNOSIS — M2550 Pain in unspecified joint: Secondary | ICD-10-CM

## 2020-01-06 DIAGNOSIS — Z8673 Personal history of transient ischemic attack (TIA), and cerebral infarction without residual deficits: Secondary | ICD-10-CM

## 2020-01-06 DIAGNOSIS — K219 Gastro-esophageal reflux disease without esophagitis: Secondary | ICD-10-CM | POA: Diagnosis not present

## 2020-01-06 DIAGNOSIS — M19011 Primary osteoarthritis, right shoulder: Secondary | ICD-10-CM | POA: Diagnosis present

## 2020-01-06 DIAGNOSIS — F1410 Cocaine abuse, uncomplicated: Secondary | ICD-10-CM | POA: Diagnosis present

## 2020-01-06 DIAGNOSIS — F209 Schizophrenia, unspecified: Secondary | ICD-10-CM | POA: Diagnosis not present

## 2020-01-06 DIAGNOSIS — M009 Pyogenic arthritis, unspecified: Secondary | ICD-10-CM | POA: Diagnosis not present

## 2020-01-06 DIAGNOSIS — M879 Osteonecrosis, unspecified: Secondary | ICD-10-CM | POA: Diagnosis not present

## 2020-01-06 DIAGNOSIS — S2232XA Fracture of one rib, left side, initial encounter for closed fracture: Secondary | ICD-10-CM | POA: Diagnosis not present

## 2020-01-06 DIAGNOSIS — W182XXA Fall in (into) shower or empty bathtub, initial encounter: Secondary | ICD-10-CM | POA: Diagnosis present

## 2020-01-06 DIAGNOSIS — M47816 Spondylosis without myelopathy or radiculopathy, lumbar region: Secondary | ICD-10-CM | POA: Diagnosis present

## 2020-01-06 DIAGNOSIS — Z888 Allergy status to other drugs, medicaments and biological substances status: Secondary | ICD-10-CM

## 2020-01-06 DIAGNOSIS — M24672 Ankylosis, left ankle: Secondary | ICD-10-CM | POA: Diagnosis present

## 2020-01-06 DIAGNOSIS — M4626 Osteomyelitis of vertebra, lumbar region: Secondary | ICD-10-CM | POA: Diagnosis not present

## 2020-01-06 DIAGNOSIS — I10 Essential (primary) hypertension: Secondary | ICD-10-CM | POA: Diagnosis not present

## 2020-01-06 DIAGNOSIS — G894 Chronic pain syndrome: Secondary | ICD-10-CM | POA: Diagnosis present

## 2020-01-06 DIAGNOSIS — M86672 Other chronic osteomyelitis, left ankle and foot: Secondary | ICD-10-CM | POA: Diagnosis present

## 2020-01-06 DIAGNOSIS — Y92012 Bathroom of single-family (private) house as the place of occurrence of the external cause: Secondary | ICD-10-CM | POA: Diagnosis not present

## 2020-01-06 DIAGNOSIS — Z20822 Contact with and (suspected) exposure to covid-19: Secondary | ICD-10-CM | POA: Diagnosis not present

## 2020-01-06 DIAGNOSIS — Z96651 Presence of right artificial knee joint: Secondary | ICD-10-CM | POA: Diagnosis not present

## 2020-01-06 DIAGNOSIS — E1169 Type 2 diabetes mellitus with other specified complication: Secondary | ICD-10-CM | POA: Diagnosis not present

## 2020-01-06 DIAGNOSIS — Z79899 Other long term (current) drug therapy: Secondary | ICD-10-CM | POA: Diagnosis not present

## 2020-01-06 DIAGNOSIS — E871 Hypo-osmolality and hyponatremia: Secondary | ICD-10-CM | POA: Diagnosis not present

## 2020-01-06 DIAGNOSIS — J449 Chronic obstructive pulmonary disease, unspecified: Secondary | ICD-10-CM | POA: Diagnosis present

## 2020-01-06 DIAGNOSIS — M75101 Unspecified rotator cuff tear or rupture of right shoulder, not specified as traumatic: Secondary | ICD-10-CM | POA: Diagnosis present

## 2020-01-06 DIAGNOSIS — F17210 Nicotine dependence, cigarettes, uncomplicated: Secondary | ICD-10-CM | POA: Diagnosis not present

## 2020-01-06 DIAGNOSIS — D509 Iron deficiency anemia, unspecified: Secondary | ICD-10-CM | POA: Diagnosis present

## 2020-01-06 DIAGNOSIS — M2540 Effusion, unspecified joint: Secondary | ICD-10-CM

## 2020-01-06 MED ORDER — MORPHINE SULFATE (PF) 4 MG/ML IV SOLN
4.0000 mg | Freq: Once | INTRAVENOUS | Status: AC
  Administered 2020-01-06: 4 mg via INTRAVENOUS
  Filled 2020-01-06: qty 1

## 2020-01-06 MED ORDER — LORAZEPAM 1 MG PO TABS: 1.0000 mg | ORAL_TABLET | Freq: Once | ORAL | Status: DC | PRN

## 2020-01-06 MED ORDER — IBUPROFEN 200 MG PO TABS
600.0000 mg | ORAL_TABLET | Freq: Once | ORAL | Status: AC
  Administered 2020-01-06: 600 mg via ORAL
  Filled 2020-01-06: qty 3

## 2020-01-06 NOTE — ED Notes (Signed)
Pt arrived via [facility] from [facility]. Pt ambulatory from [facility] stretcher to ED stretcher. A&O x 4.

## 2020-01-06 NOTE — ED Provider Notes (Signed)
[facility] DEPT Provider Note   CSN: [id number] Arrival date & time: 01/06/20  2028     History Chief Complaint  Patient presents with  . Fall    [name] is a 54 y.o. male.  HPI   Patient presents to the ED for evaluation of pain associated with the fall.  Patient states he was in the shower 3 days ago when he slipped and fell.  He hit his right shoulder his lower back and his left ribs.  Patient initially did not feel that the pain was too severe but when he continued to have pain several days later he decided to come to the ED for evaluation.  Patient denies any loss of consciousness.  He is not on any anticoagulants.  Past Medical History:  Diagnosis Date  . Anxiety   . Arthritis   . Asthma   . Depression   . Duodenal adenoma   . GERD (gastroesophageal reflux disease)   . Hypertension   . Pneumonia    hx  . Post-traumatic osteoarthritis of left ankle   . Pre-diabetes   . Presence of right artificial knee joint 12/17/2016  . Schizophrenia ([place])   . Stroke [facility])    2009-or10    Patient Active Problem List   Diagnosis Date Noted  . Acute osteomyelitis of lumbar spine ([place]) 08/16/2019  . Altered mental status   . Delirium   . Toxic metabolic encephalopathy 67/61/9509  . Polysubstance overdose 07/26/2019  . Acute kidney injury ([place]) 07/26/2019  . Chronic pain syndrome 07/26/2019  . SIRS (systemic inflammatory response syndrome) ([place]) 07/26/2019  . Spinal stenosis, lumbar region, with neurogenic claudication 04/11/2019    Class: Chronic  . Lumbar disc herniation 04/11/2019    Class: Chronic  . Status post lumbar spinal fusion 04/11/2019  . Hardware complicating wound infection ([place]) 01/31/2018  . Spondylolisthesis of lumbar region 12/30/2017  . S/P ankle fusion 10/20/2017  . Post-traumatic osteoarthritis, left ankle and foot 01/07/2017  . Primary osteoarthritis of right knee 05/27/2016  . GERD (gastroesophageal reflux  disease) 07/09/2015  . Mild persistent asthma 07/09/2015  . Type 2 diabetes mellitus ([place]) 07/09/2015  . Polyp of duodenum 06/04/2015  . Alcohol dependence ([place]) 02/25/2015  . Moderate episode of recurrent major depressive disorder ([place]) 02/25/2015  . Upper GI bleed 02/18/2015  . Benign essential HTN 02/18/2015  . Anemia of chronic disease 02/18/2015  . Pulmonary nodule 02/18/2015  . Cocaine abuse ([place]) 03/02/2014  . Posttraumatic stress disorder 03/02/2014  . History of CVA (cerebrovascular accident) 01/12/2014  . Schizophrenia ([place]) 01/12/2014  . Nicotine dependence, cigarettes, uncomplicated 32/67/1245    Past Surgical History:  Procedure Laterality Date  . ANKLE ARTHROSCOPY Left 10/20/2017   Procedure: ANKLE ARTHROSCOPY;  Surgeon: Newt Minion, MD;  Location: [place];  Service: Orthopedics;  Laterality: Left;  . ANKLE FUSION Left 10/20/2017  . ANKLE FUSION Left 10/20/2017   Procedure: LEFT ANKLE FUSION;  Surgeon: Newt Minion, MD;  Location: [facility];  Service: Orthopedics;  Laterality: Left;  . ESOPHAGOGASTRODUODENOSCOPY Left 12/05/2013   Procedure: ESOPHAGOGASTRODUODENOSCOPY (EGD);  Surgeon: Arta Silence, MD;  Location: [facility];  Service: Endoscopy;  Laterality: Left;  . ESOPHAGOGASTRODUODENOSCOPY (EGD) WITH PROPOFOL N/A 02/20/2015   Procedure: ESOPHAGOGASTRODUODENOSCOPY (EGD) WITH PROPOFOL;  Surgeon: Wilford Corner, MD;  Location: [facility];  Service: Endoscopy;  Laterality: N/A;  . HARDWARE REMOVAL Left 01/29/2018   Procedure: HARDWARE REMOVAL LEFT ANKLE;  Surgeon: Newt Minion, MD;  Location: [facility];  Service: Orthopedics;  Laterality: Left;  . JOINT REPLACEMENT    . TOTAL KNEE ARTHROPLASTY Right 07/21/2016   Procedure: TOTAL KNEE ARTHROPLASTY;  Surgeon: Meredith Pel, MD;  Location: [place];  Service: Orthopedics;  Laterality: Right;  . UPPER GASTROINTESTINAL ENDOSCOPY         Family History  Adopted: Yes  Problem Relation Age of Onset  . Colon cancer Neg  Hx   . Esophageal cancer Neg Hx   . Rectal cancer Neg Hx   . Stomach cancer Neg Hx     Social History   Tobacco Use  . Smoking status: Heavy Tobacco Smoker    Packs/day: 1.50    Years: 38.00    Pack years: 57.00    Types: Cigarettes  . Smokeless tobacco: Never Used  . Tobacco comment: onset age 16; upto 1.5 ppd  Vaping Use  . Vaping Use: Never used  Substance Use Topics  . Alcohol use: Not Currently    Alcohol/week: 3.0 standard drinks    Types: 3 Cans of beer per week    Comment: quit 2015; formerly up to 2 fifths/day  . Drug use: No    Home Medications Prior to Admission medications   Medication Sig Start Date End Date Taking? Authorizing Provider  albuterol (PROVENTIL HFA;VENTOLIN HFA) 108 (90 Base) MCG/ACT inhaler Inhale 1 puff into the lungs 2 (two) times daily as needed for wheezing or shortness of breath. Patient taking differently: Inhale 2 puffs into the lungs 2 (two) times daily as needed for wheezing or shortness of breath.  05/14/17   Diallo, Earna Coder, MD  benztropine (COGENTIN) 1 MG tablet Take 1 mg by mouth daily.    [provider]  buPROPion (WELLBUTRIN XL) 300 MG 24 hr tablet Take 300 mg by mouth daily.    [provider]  cetirizine (ZYRTEC) 10 MG tablet TAKE 1 TABLET BY MOUTH ONCE DAILY. Patient taking differently: Take 10 mg by mouth daily. No Therapeutic Substitution 03/28/18   Diallo, Abdoulaye, MD  diltiazem (CARDIZEM CD) 240 MG 24 hr capsule Take 1 capsule (240 mg total) by mouth daily. 09/05/19   Aline August, MD  folic acid (FOLVITE) 1 MG tablet Take 1 tablet (1 mg total) by mouth daily. 09/05/19   Aline August, MD  gabapentin (NEURONTIN) 300 MG capsule Take 1 capsule (300 mg total) by mouth 2 (two) times daily. 10/23/19   Jessy Oto, MD  ibuprofen (ADVIL) 400 MG tablet Take 1 tablet (400 mg total) by mouth 3 (three) times daily. 09/05/19   Aline August, MD  INVEGA TRINZA 819 MG/2.625ML SUSY Inject 819 mg into the skin every 3  (three) months. 09/08/17   [provider]  lidocaine (LIDODERM) 5 % Place 1 patch onto the skin daily. Remove & Discard patch within 12 hours or as directed by MD 09/05/19   Aline August, MD  losartan (COZAAR) 50 MG tablet Take 1 tablet (50 mg total) by mouth daily. 09/05/19   Aline August, MD  Melatonin 5 MG TABS Take 5 mg by mouth at bedtime. 02/28/19   [provider]  methocarbamol (ROBAXIN) 750 MG tablet Take 1 tablet (750 mg total) by mouth every 6 (six) hours as needed for muscle spasms. 09/05/19   Aline August, MD  mirtazapine (REMERON) 15 MG tablet Take 15 mg by mouth at bedtime.    [provider]  Multiple Vitamins-Minerals (VITRUM SENIOR) TABS Take 1 tablet by mouth daily.  04/30/15   [provider]  omeprazole (  PRILOSEC) 40 MG capsule TAKE 1 CAPSULE(40 MG) BY MOUTH DAILY Patient taking differently: Take 40 mg by mouth daily.  04/13/19   Jessy Oto, MD  oxyCODONE (OXY IR/ROXICODONE) 5 MG immediate release tablet Take 1 tablet (5 mg total) by mouth every 6 (six) hours as needed for moderate pain. 09/05/19   Aline August, MD  oxyCODONE ER (XTAMPZA ER) 9 MG C12A Take 9 mg by mouth in the morning and at bedtime. 09/05/19   Aline August, MD  polyethylene glycol (MIRALAX / GLYCOLAX) 17 g packet Take 17 g by mouth 2 (two) times daily. 09/05/19   Aline August, MD  thiamine 100 MG tablet Take 1 tablet (100 mg total) by mouth daily. 09/05/19   Aline August, MD    Allergies    Chlorhexidine  Review of Systems   Review of Systems  All other systems reviewed and are negative.   Physical Exam Updated Vital Signs BP (!) 139/104 (BP Location: Left Arm)   Pulse (!) 114   Temp 97.9 F (36.6 C) (Oral)   Resp 20   Ht 1.753 m (_0 )   Wt 86.2 kg   SpO2 97%   BMI 28.06 kg/m   Physical Exam Vitals and nursing note reviewed.  Constitutional:      General: He is not in acute distress.    Appearance: He is well-developed.  HENT:     Head:  Normocephalic and atraumatic.     Right Ear: External ear normal.     Left Ear: External ear normal.  Eyes:     General: No scleral icterus.       Right eye: No discharge.        Left eye: No discharge.     Conjunctiva/sclera: Conjunctivae normal.  Neck:     Trachea: No tracheal deviation.  Cardiovascular:     Rate and Rhythm: Normal rate and regular rhythm.  Pulmonary:     Effort: Pulmonary effort is normal. No respiratory distress.     Breath sounds: Normal breath sounds. No stridor. No wheezing or rales.  Chest:     Chest wall: Tenderness present. No deformity, crepitus or edema.     Comments: Tenderness palpation left chest wall, no bruising noted Abdominal:     General: Bowel sounds are normal. There is no distension.     Palpations: Abdomen is soft.     Tenderness: There is no abdominal tenderness. There is no guarding or rebound.  Musculoskeletal:        General: No deformity.     Right shoulder: Tenderness present. No swelling or deformity.     Cervical back: Normal and neck supple.     Thoracic back: Normal.     Lumbar back: Tenderness present. No deformity.  Skin:    General: Skin is warm and dry.     Findings: No rash.     Comments: No bruising noted on the skin  Neurological:     Mental Status: He is alert.     Cranial Nerves: No cranial nerve deficit (no facial droop, extraocular movements intact, no slurred speech).     Sensory: No sensory deficit.     Motor: No abnormal muscle tone or seizure activity.     Coordination: Coordination normal.     ED Results / Procedures / Treatments   Labs (all labs ordered are listed, but only abnormal results are displayed) Labs Reviewed  CBC WITH DIFFERENTIAL/PLATELET  BASIC METABOLIC PANEL  SEDIMENTATION RATE    EKG  None  Radiology DG Ribs Unilateral W/Chest Left  Result Date: 01/06/2020 CLINICAL DATA:  Pain status post fall EXAM: LEFT RIBS AND CHEST - 3+ VIEW COMPARISON:  Aug 02, 2019 FINDINGS: The previously  demonstrated right-sided PICC line has been removed. There is no pneumothorax. No pleural effusion. Bronchitic changes are again noted at the lung bases. The heart size is stable. There is an acute, nondisplaced fracture involving the anterolateral left ninth rib. IMPRESSION: Acute, nondisplaced fracture involving the anterolateral left ninth rib. No pneumothorax or pleural effusion. Electronically Signed   By: Constance Holster M.D.   On: 01/06/2020 21:31   DG Lumbar Spine Complete  Result Date: 01/06/2020 CLINICAL DATA:  Pain EXAM: LUMBAR SPINE - COMPLETE 4+ VIEW COMPARISON:  Aug 01, 2019 FINDINGS: The patient is status post prior L4-L5 posterior fusion. There is progressive destruction of the L1-L2 disc space, new since Aug 01, 2019. Multilevel degenerative changes are noted throughout the lumbar spine including multilevel facet arthrosis. IMPRESSION: 1. There are findings of progressive discitis osteomyelitis at the L1-L2 disc space. 2. No acute displaced fracture. 3. Multilevel degenerative changes are noted throughout the lumbar spine. Electronically Signed   By: Constance Holster M.D.   On: 01/06/2020 21:42   DG Shoulder Right  Result Date: 01/06/2020 CLINICAL DATA:  Pain status post fall EXAM: RIGHT SHOULDER - 2+ VIEW COMPARISON:  None. FINDINGS: There is moderate right glenohumeral osteoarthritis without evidence for an acute displaced fracture or dislocation. There are degenerative changes of the acromioclavicular joint. No evidence for a fracture involving the clavicle. IMPRESSION: 1. No acute displaced fracture or dislocation. 2. Moderate right glenohumeral osteoarthritis. Electronically Signed   By: Constance Holster M.D.   On: 01/06/2020 21:29    Procedures Procedures (including critical care time)  Medications Ordered in ED Medications  LORazepam (ATIVAN) tablet 1 mg (has no administration in time range)  morphine 4 MG/ML injection 4 mg (has no administration in time range)   ibuprofen (ADVIL) tablet 600 mg (600 mg Oral Given 01/06/20 2145)    ED Course  I have reviewed the triage vital signs and the nursing notes.  Pertinent labs & imaging results that were available during my care of the patient were reviewed by me and considered in my medical decision making (see chart for details).  Clinical Course as of Jan 05 2217  Sat Jan 06, 2020  2147 X-ray does show a 9th rib fracture   [JK]  2147 Shoulder without acute findings   [JK]  2148 X-ray shows progressive dysfunction at L1-L2   [JK]  2149 Previous records indicate patient had a discitis osteomyelitis in May of this year at L1-L2   [JK]  2214 Discussed with Dr Nyoka Cowden.  Cannot exclude active infection as prior imaging did not show the degree of destruction.  Will plan on MRI this evening.  [facility] need [facility].  No mri here at [facility].  Will add on CBC , bmet and esr.   [JK]  2215 Discussed with Dr Roxanne Mins.  Pt will transfer to [facility] [place].   [JK]    Clinical Course User Index [JK] Dorie Rank, MD   MDM Rules/Calculators/A&P                          Patient presented to ED for evaluation after mechanical fall a few days ago.  Patient was having pain in his right shoulder left ribs as well as lower back.  X-rays of the rib does show an isolated rib fracture.  No pneumothorax.  Shoulder x-rays are normal.  Lumbar spine films suggest possible progressive discitis osteomyelitis at the L1-L2 disc space.  This is a new finding compared to his prior imaging studies.  Patient did have a previous infection at that site.  Unclear if this is just evolution of the prior infection versus active infection.  Patient will need MRI to determine that.  MRI is not available here at this ED.  Plan on transfer to [facility] to have that performed this evening. Final Clinical Impression(s) / ED Diagnoses Final diagnoses:  Closed fracture of one rib of left side, initial encounter  Discitis of lumbar region      Dorie Rank, MD 01/06/20 2218

## 2020-01-06 NOTE — ED Triage Notes (Signed)
Pt presents by [facility] for evaluation after fall 3 days ago in shower. EMS reports that he had left rib and lower back pain. EMS reported no LOC.

## 2020-01-06 NOTE — ED Notes (Signed)
[facility] here for transport 

## 2020-01-06 NOTE — ED Notes (Signed)
Report given to [facility], Agricultural consultant

## 2020-01-07 ENCOUNTER — Emergency Department (HOSPITAL_COMMUNITY): Payer: Medicare Other

## 2020-01-07 DIAGNOSIS — M19011 Primary osteoarthritis, right shoulder: Secondary | ICD-10-CM | POA: Diagnosis present

## 2020-01-07 DIAGNOSIS — M79609 Pain in unspecified limb: Secondary | ICD-10-CM | POA: Diagnosis not present

## 2020-01-07 DIAGNOSIS — Z79899 Other long term (current) drug therapy: Secondary | ICD-10-CM | POA: Diagnosis not present

## 2020-01-07 DIAGNOSIS — Z8673 Personal history of transient ischemic attack (TIA), and cerebral infarction without residual deficits: Secondary | ICD-10-CM | POA: Diagnosis not present

## 2020-01-07 DIAGNOSIS — Z888 Allergy status to other drugs, medicaments and biological substances status: Secondary | ICD-10-CM | POA: Diagnosis not present

## 2020-01-07 DIAGNOSIS — M4649 Discitis, unspecified, multiple sites in spine: Secondary | ICD-10-CM | POA: Diagnosis present

## 2020-01-07 DIAGNOSIS — K219 Gastro-esophageal reflux disease without esophagitis: Secondary | ICD-10-CM | POA: Diagnosis present

## 2020-01-07 DIAGNOSIS — E1169 Type 2 diabetes mellitus with other specified complication: Secondary | ICD-10-CM | POA: Diagnosis present

## 2020-01-07 DIAGNOSIS — M24672 Ankylosis, left ankle: Secondary | ICD-10-CM | POA: Diagnosis not present

## 2020-01-07 DIAGNOSIS — G061 Intraspinal abscess and granuloma: Secondary | ICD-10-CM | POA: Diagnosis present

## 2020-01-07 DIAGNOSIS — M47816 Spondylosis without myelopathy or radiculopathy, lumbar region: Secondary | ICD-10-CM | POA: Diagnosis present

## 2020-01-07 DIAGNOSIS — M869 Osteomyelitis, unspecified: Secondary | ICD-10-CM | POA: Insufficient documentation

## 2020-01-07 DIAGNOSIS — M86672 Other chronic osteomyelitis, left ankle and foot: Secondary | ICD-10-CM | POA: Diagnosis present

## 2020-01-07 DIAGNOSIS — M4646 Discitis, unspecified, lumbar region: Secondary | ICD-10-CM | POA: Diagnosis present

## 2020-01-07 DIAGNOSIS — E871 Hypo-osmolality and hyponatremia: Secondary | ICD-10-CM | POA: Diagnosis not present

## 2020-01-07 DIAGNOSIS — M4626 Osteomyelitis of vertebra, lumbar region: Secondary | ICD-10-CM | POA: Diagnosis present

## 2020-01-07 DIAGNOSIS — Z981 Arthrodesis status: Secondary | ICD-10-CM | POA: Diagnosis not present

## 2020-01-07 DIAGNOSIS — S2232XK Fracture of one rib, left side, subsequent encounter for fracture with nonunion: Secondary | ICD-10-CM | POA: Diagnosis not present

## 2020-01-07 DIAGNOSIS — W182XXA Fall in (into) shower or empty bathtub, initial encounter: Secondary | ICD-10-CM | POA: Diagnosis present

## 2020-01-07 DIAGNOSIS — Y93E1 Activity, personal bathing and showering: Secondary | ICD-10-CM | POA: Diagnosis not present

## 2020-01-07 DIAGNOSIS — F1410 Cocaine abuse, uncomplicated: Secondary | ICD-10-CM | POA: Diagnosis present

## 2020-01-07 DIAGNOSIS — G894 Chronic pain syndrome: Secondary | ICD-10-CM | POA: Diagnosis present

## 2020-01-07 DIAGNOSIS — J449 Chronic obstructive pulmonary disease, unspecified: Secondary | ICD-10-CM | POA: Diagnosis present

## 2020-01-07 DIAGNOSIS — Z96651 Presence of right artificial knee joint: Secondary | ICD-10-CM | POA: Diagnosis present

## 2020-01-07 DIAGNOSIS — Z20822 Contact with and (suspected) exposure to covid-19: Secondary | ICD-10-CM | POA: Diagnosis present

## 2020-01-07 DIAGNOSIS — I10 Essential (primary) hypertension: Secondary | ICD-10-CM | POA: Diagnosis present

## 2020-01-07 DIAGNOSIS — M879 Osteonecrosis, unspecified: Secondary | ICD-10-CM | POA: Diagnosis present

## 2020-01-07 DIAGNOSIS — F209 Schizophrenia, unspecified: Secondary | ICD-10-CM | POA: Diagnosis present

## 2020-01-07 DIAGNOSIS — F17210 Nicotine dependence, cigarettes, uncomplicated: Secondary | ICD-10-CM | POA: Diagnosis present

## 2020-01-07 DIAGNOSIS — S2232XA Fracture of one rib, left side, initial encounter for closed fracture: Secondary | ICD-10-CM | POA: Diagnosis present

## 2020-01-07 DIAGNOSIS — Y92012 Bathroom of single-family (private) house as the place of occurrence of the external cause: Secondary | ICD-10-CM | POA: Diagnosis not present

## 2020-01-07 DIAGNOSIS — M009 Pyogenic arthritis, unspecified: Secondary | ICD-10-CM | POA: Diagnosis present

## 2020-01-07 LAB — CBC WITH DIFFERENTIAL/PLATELET
Abs Immature Granulocytes: 0.03 10*3/uL (ref 0.00–0.07)
Basophils Absolute: 0 10*3/uL (ref 0.0–0.1)
Basophils Relative: 1 %
Eosinophils Absolute: 0.1 10*3/uL (ref 0.0–0.5)
Eosinophils Relative: 2 %
HCT: 35.1 % — ABNORMAL LOW (ref 39.0–52.0)
Hemoglobin: 10.9 g/dL — ABNORMAL LOW (ref 13.0–17.0)
Immature Granulocytes: 0 %
Lymphocytes Relative: 15 %
Lymphs Abs: 1.1 10*3/uL (ref 0.7–4.0)
MCH: 26.5 pg (ref 26.0–34.0)
MCHC: 31.1 g/dL (ref 30.0–36.0)
MCV: 85.2 fL (ref 80.0–100.0)
Monocytes Absolute: 0.5 10*3/uL (ref 0.1–1.0)
Monocytes Relative: 6 %
Neutro Abs: 5.8 10*3/uL (ref 1.7–7.7)
Neutrophils Relative %: 76 %
Platelets: 390 10*3/uL (ref 150–400)
RBC: 4.12 MIL/uL — ABNORMAL LOW (ref 4.22–5.81)
RDW: 15.7 % — ABNORMAL HIGH (ref 11.5–15.5)
WBC: 7.6 10*3/uL (ref 4.0–10.5)
nRBC: 0 % (ref 0.0–0.2)

## 2020-01-07 LAB — COMPREHENSIVE METABOLIC PANEL
ALT: 8 U/L (ref 0–44)
AST: 29 U/L (ref 15–41)
Albumin: 3.3 g/dL — ABNORMAL LOW (ref 3.5–5.0)
Alkaline Phosphatase: 98 U/L (ref 38–126)
Anion gap: 10 (ref 5–15)
BUN: 7 mg/dL (ref 6–20)
CO2: 23 mmol/L (ref 22–32)
Calcium: 8.9 mg/dL (ref 8.9–10.3)
Chloride: 101 mmol/L (ref 98–111)
Creatinine, Ser: 0.92 mg/dL (ref 0.61–1.24)
GFR, Estimated: >60 mL/min (ref >60)
Glucose, Bld: 156 mg/dL — ABNORMAL HIGH (ref 70–99)
Potassium: 4.2 mmol/L (ref 3.5–5.1)
Sodium: 134 mmol/L — ABNORMAL LOW (ref 135–145)
Total Bilirubin: 0.8 mg/dL (ref 0.3–1.2)
Total Protein: 6.3 g/dL — ABNORMAL LOW (ref 6.5–8.1)

## 2020-01-07 LAB — RESPIRATORY PANEL BY RT PCR (FLU A&B, COVID)
Influenza A by PCR: NEGATIVE
Influenza B by PCR: NEGATIVE
SARS Coronavirus 2 by RT PCR: NEGATIVE

## 2020-01-07 LAB — C-REACTIVE PROTEIN: CRP: 2.1 mg/dL — ABNORMAL HIGH (ref <1.0)

## 2020-01-07 MED ORDER — GADOBUTROL 1 MMOL/ML IV SOLN
7.5000 mL | Freq: Once | INTRAVENOUS | Status: AC | PRN
  Administered 2020-01-07: 7.5 mL via INTRAVENOUS

## 2020-01-07 MED ORDER — IPRATROPIUM-ALBUTEROL 0.5-2.5 (3) MG/3ML IN SOLN: 3.0000 mL | Freq: Four times a day (QID) | RESPIRATORY_TRACT | Status: DC | PRN

## 2020-01-07 MED ORDER — BUPROPION HCL ER (XL) 150 MG PO TB24
300.0000 mg | ORAL_TABLET | Freq: Every day | ORAL | Status: DC
  Administered 2020-01-07 – 2020-01-16 (×10): 300 mg via ORAL
  Filled 2020-01-07 (×10): qty 2

## 2020-01-07 MED ORDER — SODIUM CHLORIDE 0.9 % IV SOLN
250.0000 mL | INTRAVENOUS | Status: DC | PRN
  Administered 2020-01-15 – 2020-02-19 (×5): 250 mL via INTRAVENOUS

## 2020-01-07 MED ORDER — MIRTAZAPINE 15 MG PO TABS
15.0000 mg | ORAL_TABLET | Freq: Every day | ORAL | Status: DC
  Administered 2020-01-07 – 2020-02-19 (×44): 15 mg via ORAL
  Filled 2020-01-07 (×46): qty 1

## 2020-01-07 MED ORDER — ONDANSETRON HCL 4 MG/2ML IJ SOLN
4.0000 mg | Freq: Once | INTRAMUSCULAR | Status: AC | PRN
  Administered 2020-01-07: 4 mg via INTRAVENOUS
  Filled 2020-01-07: qty 2

## 2020-01-07 MED ORDER — MORPHINE SULFATE (PF) 4 MG/ML IV SOLN
4.0000 mg | Freq: Once | INTRAVENOUS | Status: AC
  Administered 2020-01-07: 4 mg via INTRAVENOUS
  Filled 2020-01-07: qty 1

## 2020-01-07 MED ORDER — MORPHINE SULFATE (PF) 2 MG/ML IV SOLN
1.0000 mg | INTRAVENOUS | Status: DC | PRN
  Administered 2020-01-08 – 2020-01-10 (×9): 1 mg via INTRAVENOUS
  Filled 2020-01-07 (×9): qty 1

## 2020-01-07 MED ORDER — BENZTROPINE MESYLATE 1 MG PO TABS
1.0000 mg | ORAL_TABLET | Freq: Every day | ORAL | Status: DC
  Administered 2020-01-07 – 2020-02-20 (×45): 1 mg via ORAL
  Filled 2020-01-07 (×48): qty 1

## 2020-01-07 MED ORDER — MELATONIN 5 MG PO TABS
5.0000 mg | ORAL_TABLET | Freq: Every evening | ORAL | Status: DC | PRN
  Administered 2020-01-07 – 2020-02-20 (×28): 5 mg via ORAL
  Filled 2020-01-07 (×30): qty 1

## 2020-01-07 MED ORDER — OXYCODONE HCL 5 MG PO TABS
5.0000 mg | ORAL_TABLET | ORAL | Status: DC | PRN
  Administered 2020-01-07 (×2): 5 mg via ORAL
  Filled 2020-01-07 (×3): qty 1

## 2020-01-07 MED ORDER — SODIUM CHLORIDE 0.9% FLUSH
3.0000 mL | Freq: Two times a day (BID) | INTRAVENOUS | Status: DC
  Administered 2020-01-07 – 2020-02-19 (×34): 3 mL via INTRAVENOUS

## 2020-01-07 MED ORDER — HYDROCODONE-ACETAMINOPHEN 5-325 MG PO TABS
1.0000 | ORAL_TABLET | ORAL | Status: DC | PRN
  Administered 2020-01-07: 2 via ORAL
  Filled 2020-01-07 (×2): qty 2

## 2020-01-07 MED ORDER — PANTOPRAZOLE SODIUM 40 MG PO TBEC
40.0000 mg | DELAYED_RELEASE_TABLET | Freq: Every day | ORAL | Status: DC
  Administered 2020-01-07 – 2020-02-20 (×45): 40 mg via ORAL
  Filled 2020-01-07 (×47): qty 1

## 2020-01-07 MED ORDER — SODIUM CHLORIDE 0.9% FLUSH
3.0000 mL | INTRAVENOUS | Status: DC | PRN
  Administered 2020-01-16 – 2020-02-14 (×3): 3 mL via INTRAVENOUS
  Administered 2020-02-14: 2.5 mL via INTRAVENOUS

## 2020-01-07 MED ORDER — GABAPENTIN 300 MG PO CAPS
300.0000 mg | ORAL_CAPSULE | Freq: Two times a day (BID) | ORAL | Status: DC
  Administered 2020-01-07 – 2020-02-20 (×89): 300 mg via ORAL
  Filled 2020-01-07 (×89): qty 1

## 2020-01-07 MED ORDER — LOSARTAN POTASSIUM 50 MG PO TABS
100.0000 mg | ORAL_TABLET | Freq: Every day | ORAL | Status: DC
  Administered 2020-01-07 – 2020-01-16 (×10): 100 mg via ORAL
  Filled 2020-01-07 (×10): qty 2

## 2020-01-07 NOTE — ED Provider Notes (Signed)
Blood pressure (!) 139/104, pulse (!) 114, temperature 97.9 F (36.6 C), temperature source Oral, resp. rate 20, height 5\' 9"  (1.753 m), weight 86.2 kg, SpO2 99 %.  In short, [name] is a 54 y.o. male with a chief complaint of Fall .  Refer to the original H&P for additional details.  Patient presents to the emergency department for MRI.  This result came back showing apparently new infection type changes on MRI.  Patient's case was discussed with neurosurgery team on call and they will consult this morning.  Hold on antibiotics for now as patient is not septic and they can coordinate with the admission team.  Labs sent.  Updated the patient's fianc [name] and patient at bedside.  Plan for hospitalist admission.   Discussed patient's case with [facility] to request admission. Patient and family (if present) updated with plan. Care transferred to [facility] service.  I reviewed all nursing notes, vitals, pertinent old records, EKGs, labs, imaging (as available).    Margette Fast, MD 01/10/20 [phone]

## 2020-01-07 NOTE — ED Notes (Addendum)
Pt back form mri, Pt resting in bed, nadn, side rails up.

## 2020-01-07 NOTE — ED Notes (Signed)
Pt aao2, [id number], reporting loc and fall at home, cannot remember if pt hit his head states left rib pain, pt able to follow commands. nih 1, vss and nadn. Side rails up, bed in locked position.

## 2020-01-07 NOTE — Consult Note (Addendum)
Providing Compassionate, Quality Care - Together    Reason for Consult:fall  Referring Physician:   [name] is an 54 y.o. male.  HPI: 54 years old male with a history of history of lumbar laminectomy/fusion, polysubstance abuse, schizophrenia, CVA, asthma, and GERD who presents here after a fall 3 days ago. Pt denies any loss of consciousness. He is not on any anticoagulants. He presents here today for MRI lumbar. Neurosurgery was consulted for MRI results. He was admitted to the hospital in June 2021 osteomyelitis/discitis with epidural phlegmon not amenable to drainage. Infectious disease recommended 6 weeks of intravenous antibiotic therapy with ceftriaxone and vancomycin.  Past Medical History:  Diagnosis Date  . Anxiety   . Arthritis   . Asthma   . Depression   . Duodenal adenoma   . GERD (gastroesophageal reflux disease)   . Hypertension   . Pneumonia    hx  . Post-traumatic osteoarthritis of left ankle   . Pre-diabetes   . Presence of right artificial knee joint 12/17/2016  . Schizophrenia ([place])   . Stroke [facility])    [id number]    Past Surgical History:  Procedure Laterality Date  . ANKLE ARTHROSCOPY Left 10/20/2017   Procedure: ANKLE ARTHROSCOPY;  Surgeon: Newt Minion, MD;  Location: [place];  Service: Orthopedics;  Laterality: Left;  . ANKLE FUSION Left 10/20/2017  . ANKLE FUSION Left 10/20/2017   Procedure: LEFT ANKLE FUSION;  Surgeon: Newt Minion, MD;  Location: [place];  Service: Orthopedics;  Laterality: Left;  . ESOPHAGOGASTRODUODENOSCOPY Left 12/05/2013   Procedure: ESOPHAGOGASTRODUODENOSCOPY (EGD);  Surgeon: Arta Silence, MD;  Location: [facility];  Service: Endoscopy;  Laterality: Left;  . ESOPHAGOGASTRODUODENOSCOPY (EGD) WITH PROPOFOL N/A 02/20/2015   Procedure: ESOPHAGOGASTRODUODENOSCOPY (EGD) WITH PROPOFOL;  Surgeon: Wilford Corner, MD;  Location: [facility];  Service: Endoscopy;  Laterality: N/A;  . HARDWARE REMOVAL Left 01/29/2018    Procedure: HARDWARE REMOVAL LEFT ANKLE;  Surgeon: Newt Minion, MD;  Location: [place];  Service: Orthopedics;  Laterality: Left;  . JOINT REPLACEMENT    . TOTAL KNEE ARTHROPLASTY Right 07/21/2016   Procedure: TOTAL KNEE ARTHROPLASTY;  Surgeon: Meredith Pel, MD;  Location: [place];  Service: Orthopedics;  Laterality: Right;  . UPPER GASTROINTESTINAL ENDOSCOPY      Family History  Adopted: Yes  Problem Relation Age of Onset  . Colon cancer Neg Hx   . Esophageal cancer Neg Hx   . Rectal cancer Neg Hx   . Stomach cancer Neg Hx     Social History:  reports that he has been smoking cigarettes. He has a 57.00 pack-year smoking history. He has never used smokeless tobacco. He reports previous alcohol use of about 3.0 standard drinks of alcohol per week. He reports that he does not use drugs.  Allergies:  Allergies  Allergen Reactions  . Chlorhexidine Itching and Rash    Results for orders placed or performed during the hospital encounter of 01/06/20 (from the past 48 hour(s))  Respiratory Panel by RT PCR (Flu A&B, Covid) - Nasopharyngeal Swab     Status: None   Collection Time: 01/07/20  3:09 AM   Specimen: Nasopharyngeal Swab  Result Value Ref Range   SARS Coronavirus 2 by RT PCR NEGATIVE NEGATIVE    Comment: (NOTE) SARS-CoV-2 target nucleic acids are NOT DETECTED.  The SARS-CoV-2 RNA is generally detectable in upper respiratoy specimens during the acute phase of infection. The lowest concentration of SARS-CoV-2 viral copies this assay can detect is 131  copies/mL. A negative result does not preclude SARS-Cov-2 infection and should not be used as the sole basis for treatment or other patient management decisions. A negative result may occur with  improper specimen collection/handling, submission of specimen other than nasopharyngeal swab, presence of viral mutation(s) within the areas targeted by this assay, and inadequate number of viral copies (<131 copies/mL). A negative  result must be combined with clinical observations, patient history, and epidemiological information. The expected result is Negative.  Fact Sheet for Patients:  PinkCheek.be  Fact Sheet for Healthcare Providers:  GravelBags.it  This test is no t yet approved or cleared by the [place] FDA and  has been authorized for detection and/or diagnosis of SARS-CoV-2 by FDA under an Emergency Use Authorization (EUA). This EUA will remain  in effect (meaning this test can be used) for the duration of the COVID-19 declaration under Section 564(b)(1) of the Act, 21 U.S.C. section 360bbb-3(b)(1), unless the authorization is terminated or revoked sooner.     Influenza A by PCR NEGATIVE NEGATIVE   Influenza B by PCR NEGATIVE NEGATIVE    Comment: (NOTE) The Xpert Xpress SARS-CoV-2/FLU/RSV assay is intended as an aid in  the diagnosis of influenza from Nasopharyngeal swab specimens and  should not be used as a sole basis for treatment. Nasal washings and  aspirates are unacceptable for Xpert Xpress SARS-CoV-2/FLU/RSV  testing.  Fact Sheet for Patients: PinkCheek.be  Fact Sheet for Healthcare Providers: GravelBags.it  This test is not yet approved or cleared by the [place] FDA and  has been authorized for detection and/or diagnosis of SARS-CoV-2 by  FDA under an Emergency Use Authorization (EUA). This EUA will remain  in effect (meaning this test can be used) for the duration of the  Covid-19 declaration under Section 564(b)(1) of the Act, 21  U.S.C. section 360bbb-3(b)(1), unless the authorization is  terminated or revoked. Performed at [facility], [place] [address], [place], [place] [address]   C-reactive protein     Status: Abnormal   Collection Time: 01/07/20  6:44 AM  Result Value Ref Range   CRP 2.1 (H) <1.0 mg/dL    Comment: Performed at [facility] [address], [place], [place] [address]    DG Ribs Unilateral W/Chest Left  Result Date: 01/06/2020 CLINICAL DATA:  Pain status post fall EXAM: LEFT RIBS AND CHEST - 3+ VIEW COMPARISON:  Aug 02, 2019 FINDINGS: The previously demonstrated right-sided PICC line has been removed. There is no pneumothorax. No pleural effusion. Bronchitic changes are again noted at the lung bases. The heart size is stable. There is an acute, nondisplaced fracture involving the anterolateral left ninth rib. IMPRESSION: Acute, nondisplaced fracture involving the anterolateral left ninth rib. No pneumothorax or pleural effusion. Electronically Signed   By: Constance Holster M.D.   On: 01/06/2020 21:31   DG Lumbar Spine Complete  Result Date: 01/06/2020 CLINICAL DATA:  Pain EXAM: LUMBAR SPINE - COMPLETE 4+ VIEW COMPARISON:  Aug 01, 2019 FINDINGS: The patient is status post prior L4-L5 posterior fusion. There is progressive destruction of the L1-L2 disc space, new since Aug 01, 2019. Multilevel degenerative changes are noted throughout the lumbar spine including multilevel facet arthrosis. IMPRESSION: 1. There are findings of progressive discitis osteomyelitis at the L1-L2 disc space. 2. No acute displaced fracture. 3. Multilevel degenerative changes are noted throughout the lumbar spine. Electronically Signed   By: Constance Holster M.D.   On: 01/06/2020 21:42   DG Shoulder Right  Result Date: 01/06/2020 CLINICAL DATA:  Pain status post fall EXAM: RIGHT SHOULDER - 2+ VIEW COMPARISON:  None. FINDINGS: There is moderate right glenohumeral osteoarthritis without evidence for an acute displaced fracture or dislocation. There are degenerative changes of the acromioclavicular joint. No evidence for a fracture involving the clavicle. IMPRESSION: 1. No acute displaced fracture or dislocation. 2. Moderate right glenohumeral osteoarthritis. Electronically Signed   By: Constance Holster M.D.   On: 01/06/2020 21:29   MR Lumbar  Spine W Wo Contrast  Result Date: 01/07/2020 CLINICAL DATA:  Initial evaluation for low back pain, infection suspected. EXAM: MRI LUMBAR SPINE WITHOUT AND WITH CONTRAST TECHNIQUE: Multiplanar and multiecho pulse sequences of the lumbar spine were obtained without and with intravenous contrast. CONTRAST:  7.45mL GADAVIST GADOBUTROL 1 MMOL/ML IV SOLN COMPARISON:  Prior MRI from 08/04/2019. FINDINGS: Segmentation: Standard. Same numbering system employed as on previous exam. Alignment: Straightening of the normal lumbar lordosis. Slight focal kyphotic angulation at L1-2, progressed from previous. Two-3 mm retrolisthesis of L2 on L3 and L3 on L4, similar. Chronic 3 mm anterolisthesis of L4 on L5 also unchanged. Vertebrae: Progressive findings of osteomyelitis discitis about the L1-2 interspace with progressive disc space height loss and endplate irregularity. Relatively mild residual endplate edema and enhancement, with no other significant findings for osteomyelitis discitis at this level, improved relative to previous exam. Now seen is abnormal marrow edema and enhancement about the right L3-4 facet, concerning for possible septic arthritis (series 5, image 6). Associated trace joint effusion. There is abnormal epidural phlegmon and enhancement extending from the T12-L1 level inferiorly through the distal aspect of the thecal sac (series 20, image 10). Involvement of the epidural space is fairly circumferential in nature, and most pronounced at the levels of L3 and L4. Possible superimposed small epidural abscess at the right dorsal epidural space at the level of L4-5, measuring up to 6 mm in diameter (series 9, image 23). Resultant moderate to severe diffuse spinal stenosis extending from L1-2 inferiorly through the distal thecal sac, most pronounced at L3-4 and L4-5. No evidence for acute or interval fracture. Underlying bone marrow signal intensity within normal limits. No worrisome osseous lesions. Conus  medullaris and cauda equina: Conus extends to the L1 level. Conus medullaris itself is relatively normal in appearance. Nerve roots of the cauda equina are irregular and undulating related to underlying stenosis. Paraspinal and other soft tissues: Abnormal paraspinous edema and enhancement within the right greater than left posterior paraspinous soft tissues, which could reflect changes related to acute myositis and/or postoperative changes. No soft tissue abscess or drainable fluid collection. Few small simple cyst noted within the posterior right hepatic lobe and interpolar left kidney. Visualized visceral structures otherwise unremarkable. Disc levels: T12-L1: Mild disc bulge with disc desiccation. Small amount of dorsal epidural phlegmon. Mild facet hypertrophy. No significant spinal stenosis. Foramina remain patent. L1-2: Progressive changes of osteomyelitis discitis with progressive disc space height loss and endplate irregularity and slight focal kyphotic angulation. Resultant circumferential disc osteophyte complex. Circumferential epidural phlegmon and enhancement. Mild facet hypertrophy. Resultant moderate to severe spinal stenosis. Fairly severe bilateral L1 foraminal narrowing. L2-3: Diffuse disc bulge with disc desiccation and intervertebral disc space narrowing. Superimposed right extraforaminal disc protrusion (series 9, image 14). Circumferential epidural phlegmon. Mild facet hypertrophy. Resultant severe spinal stenosis. Foramina remain patent. L3-4: Diffuse disc bulge with disc desiccation. Moderate bilateral facet hypertrophy with findings concerning for acute septic arthritis at the right L3-4 facet. Circumferential epidural phlegmon and enhancement. Resultant severe spinal  stenosis with near complete effacement of the thecal sac. Mild to moderate right worse than left L3 foraminal narrowing. L4-5: Sequelae of prior PLIF. Circumferential epidural phlegmon and enhancement with possible 6 mm  epidural abscess at the dorsal epidural space (series 9, image 23). Resultant moderate to severe spinal stenosis. Nerve roots of the cauda equina demonstrate a trident morphology due to proximal stenosis and/or arachnoiditis. Foramina remain patent. L5-S1: Negative interspace. Epidural phlegmon and enhancement without significant spinal stenosis. Foramina remain patent. IMPRESSION: 1. Findings concerning for acute septic arthritis involving the right L3-4 facet. Associated circumferential epidural phlegmon and enhancement extending from T12-L1 inferiorly through the distal thecal sac. Resultant moderate to severe diffuse spinal stenosis extending from L1-2 inferiorly as above. 2. Abnormal paraspinous edema and enhancement within the right greater than left posterior paraspinous soft tissues, nonspecific, but could reflect changes related to acute myositis and/or postoperative changes. No soft tissue abscess or drainable fluid collection. 3. Interval improvement changes related to osteomyelitis discitis about the L1-2 interspace, with only mild residual endplate edema enhancement now seen at this time. 4. Underlying multilevel degenerative spondylosis as above. Electronically Signed   By: Jeannine Boga M.D.   On: 01/07/2020 02:51    Review of Systems  Constitutional: Negative.   Respiratory: Negative.   Cardiovascular: Negative.   Gastrointestinal: Negative.   Musculoskeletal: Positive for arthralgias and back pain.  Neurological: Positive for numbness. Negative for weakness.   Blood pressure (!) 139/104, pulse (!) 114, temperature 97.9 F (36.6 C), temperature source Oral, resp. rate 20, height 5\' 9"  (1.753 m), weight 86.2 kg, SpO2 99 %.   Physical Exam Constitutional:      Appearance: Normal appearance.  HENT:     Head: Normocephalic.  Pulmonary:     Effort: Pulmonary effort is normal.  Musculoskeletal:     Cervical back: Normal range of motion.     Comments: Sensory: numbness  bilateral feet, can on feel on the big toe RLE,  Motor: 5/5 RLE PF/DF, 4/5 LUE PF/DF,   Skin:    General: Skin is warm.  Neurological:     General: No focal deficit present.     Mental Status: He is alert and oriented to person, place, and time.    MRI lumbar was reviewed by Dr. Zada Finders.  Assessment/Plan: 54 y.o presents here MRI after a fall 3 days ago. Patient's exam is at his baseline. No new numbness or motor deficit were noted.   -Biopsy into disc space by IR at L1-L2 level -start antibiotic after biopsy, spoke to Dr. Dewaine Oats, she will consult ID for appropriate antibiotic mangement  Osie Cheeks, NP 01/07/2020, 9:10 AM    MRI reviewed, post-op changes at L4-5 with hardware artifact, L1-2 discitis / endplate erosion with canal stenosis and circumferential enhancing phlegmon, possible small discrete abscess arising from the R L4-5 facet. Pt at neurologic baseline, recommend maximal medical Tx w/ IR guided disc space bx to guide ABx narrowing.

## 2020-01-07 NOTE — ED Notes (Signed)
[name], [phone] would like an update when available

## 2020-01-07 NOTE — H&P (Signed)
History and Physical:    [name]   ZOX:[id number] DOB: 1965/05/20 DOA: 01/06/2020  Referring MD/provider: Dr Sedalia Muta PCP: [name], MD   Patient coming from: Home  Chief Complaint: Fall  History of Present Illness:   [name] is an 54 y.o. male with PMH significant for discitis April 2021treated with 6 weeks vancomycin and ceftriaxone with no growth on cultures, status post laminectomy, schizophrenia and ongoing cocaine use was in his [facility] H till 4 days ago when he had a mechanical fall in the shower.  Since that fall patient has had shoulder pain and back pain whichshe thought would improve on its own.  However his back pain continued so he came to [facility] ED for evaluation.  Patient himself denies any fevers or chills.  Does admit to being somewhat more tired lately.  However denies any acute symptoms other than persistent ongoing back pain which is been worse over the past 3 to 4 days.  Patient is not sure if the back pain worsened first or if the fall occurred first.  Patient denies any change in his lower extremity strength.  Is still able to walk without difficulty.  He still has numbness laterally which is not new and is unchanged.  ED Course:  The patient was noted to be afebrile with normal vital signs.  Imaging showed isolated rib fracture without pneumothorax however L-spine showed possible progressive discitis/osteomyelitis.  Patient was transferred to [facility] for MRI.  Patient underwent MRI which shows possible septic arthritis at L3-4 with improved discitis at L1-2.  Patient has a phlegmon which she was noted to have before and apparently was thought to be not amenable to surgical resection due to previous surgeries.  Patient was seen by neurosurgery who recommend biopsy by IR.  ROS:   ROS   Review of Systems: General: Denies fever, chills, malaise,  Respiratory: Denies cough, SOB at rest or hemoptysis Cardiovascular: Denies chest pain or  palpitations GI: Denies nausea, vomiting, diarrhea or constipation GU: Denies dysuria, frequency or hematuria CNS: Denies HA, dizziness, confusion, new weakness or clumsiness.   Past Medical History:   Past Medical History:  Diagnosis Date  . Anxiety   . Arthritis   . Asthma   . Depression   . Duodenal adenoma   . GERD (gastroesophageal reflux disease)   . Hypertension   . Pneumonia    hx  . Post-traumatic osteoarthritis of left ankle   . Pre-diabetes   . Presence of right artificial knee joint 12/17/2016  . Schizophrenia (HCC)   . Stroke [facility])    2009-or10    Past Surgical History:   Past Surgical History:  Procedure Laterality Date  . ANKLE ARTHROSCOPY Left 10/20/2017   Procedure: ANKLE ARTHROSCOPY;  Surgeon: Nadara Mustard, MD;  Location: [facility] OR;  Service: Orthopedics;  Laterality: Left;  . ANKLE FUSION Left 10/20/2017  . ANKLE FUSION Left 10/20/2017   Procedure: LEFT ANKLE FUSION;  Surgeon: Nadara Mustard, MD;  Location: [facility] OR;  Service: Orthopedics;  Laterality: Left;  . ESOPHAGOGASTRODUODENOSCOPY Left 12/05/2013   Procedure: ESOPHAGOGASTRODUODENOSCOPY (EGD);  Surgeon: Willis Modena, MD;  Location: [facility]COPY;  Service: Endoscopy;  Laterality: Left;  . ESOPHAGOGASTRODUODENOSCOPY (EGD) WITH PROPOFOL N/A 02/20/2015   Procedure: ESOPHAGOGASTRODUODENOSCOPY (EGD) WITH PROPOFOL;  Surgeon: Charlott Rakes, MD;  Location: [facility] ENDOSCOPY;  Service: Endoscopy;  Laterality: N/A;  . HARDWARE REMOVAL Left 01/29/2018   Procedure: HARDWARE REMOVAL LEFT ANKLE;  Surgeon: Nadara Mustard, MD;  Location: [facility] OR;  Service: Orthopedics;  Laterality: Left;  . JOINT REPLACEMENT    . TOTAL KNEE ARTHROPLASTY Right 07/21/2016   Procedure: TOTAL KNEE ARTHROPLASTY;  Surgeon: Cammy Copa, MD;  Location: [facility] OR;  Service: Orthopedics;  Laterality: Right;  . UPPER GASTROINTESTINAL ENDOSCOPY      Social History:   Social History   Socioeconomic History  . Marital status: Significant Other     Spouse name: Not on file  . Number of children: Not on file  . Years of education: Not on file  . Highest education level: Not on file  Occupational History  . Not on file  Tobacco Use  . Smoking status: Heavy Tobacco Smoker    Packs/day: 1.50    Years: 38.00    Pack years: 57.00    Types: Cigarettes  . Smokeless tobacco: Never Used  . Tobacco comment: onset age 8; upto 1.5 ppd  Vaping Use  . Vaping Use: Never used  Substance and Sexual Activity  . Alcohol use: Not Currently    Alcohol/week: 3.0 standard drinks    Types: 3 Cans of beer per week    Comment: quit 2015; formerly up to 2 fifths/day  . Drug use: No  . Sexual activity: Yes    Comment: declined condoms  Other Topics Concern  . Not on file  Social History Narrative  . Not on file   Social Determinants of Health   Financial Resource Strain:   . Difficulty of Paying Living Expenses: Not on file  Food Insecurity:   . Worried About Programme researcher, broadcasting/film/video in the Last Year: Not on file  . Ran Out of Food in the Last Year: Not on file  Transportation Needs:   . Lack of Transportation (Medical): Not on file  . Lack of Transportation (Non-Medical): Not on file  Physical Activity:   . Days of Exercise per Week: Not on file  . Minutes of Exercise per Session: Not on file  Stress:   . Feeling of Stress : Not on file  Social Connections:   . Frequency of Communication with Friends and Family: Not on file  . Frequency of Social Gatherings with Friends and Family: Not on file  . Attends Religious Services: Not on file  . Active Member of Clubs or Organizations: Not on file  . Attends Banker Meetings: Not on file  . Marital Status: Not on file  Intimate Partner Violence:   . Fear of Current or Ex-Partner: Not on file  . Emotionally Abused: Not on file  . Physically Abused: Not on file  . Sexually Abused: Not on file    Allergies   Chlorhexidine  Family history:   Family History  Adopted: Yes   Problem Relation Age of Onset  . Colon cancer Neg Hx   . Esophageal cancer Neg Hx   . Rectal cancer Neg Hx   . Stomach cancer Neg Hx     Current Medications:   Prior to Admission medications   Medication Sig Start Date End Date Taking? Authorizing Provider  gabapentin (NEURONTIN) 300 MG capsule Take 1 capsule (300 mg total) by mouth 2 (two) times daily. 10/23/19  Yes Kerrin Champagne, MD  albuterol (PROVENTIL HFA;VENTOLIN HFA) 108 (90 Base) MCG/ACT inhaler Inhale 1 puff into the lungs 2 (two) times daily as needed for wheezing or shortness of breath. Patient not taking: Reported on 01/07/2020 05/14/17   Lovena Neighbours, MD  benztropine (COGENTIN) 1 MG tablet Take 1  mg by mouth daily.    [provider]  buPROPion (WELLBUTRIN XL) 300 MG 24 hr tablet Take 300 mg by mouth daily.    [provider]  cetirizine (ZYRTEC) 10 MG tablet TAKE 1 TABLET BY MOUTH ONCE DAILY. Patient taking differently: Take 10 mg by mouth daily. No Therapeutic Substitution 03/28/18   Diallo, Abdoulaye, MD  diltiazem (CARDIZEM CD) 240 MG 24 hr capsule Take 1 capsule (240 mg total) by mouth daily. 09/05/19   Glade Lloyd, MD  folic acid (FOLVITE) 1 MG tablet Take 1 tablet (1 mg total) by mouth daily. 09/05/19   Glade Lloyd, MD  ibuprofen (ADVIL) 400 MG tablet Take 1 tablet (400 mg total) by mouth 3 (three) times daily. Patient not taking: Reported on 01/07/2020 09/05/19   Glade Lloyd, MD  INVEGA TRINZA 819 MG/2.625ML SUSY Inject 819 mg into the skin every 3 (three) months. 09/08/17   [provider]  lidocaine (LIDODERM) 5 % Place 1 patch onto the skin daily. Remove & Discard patch within 12 hours or as directed by MD 09/05/19   Glade Lloyd, MD  losartan (COZAAR) 50 MG tablet Take 1 tablet (50 mg total) by mouth daily. 09/05/19   Glade Lloyd, MD  Melatonin 5 MG TABS Take 5 mg by mouth at bedtime. 02/28/19   [provider]  methocarbamol (ROBAXIN) 750 MG tablet Take 1 tablet (750 mg  total) by mouth every 6 (six) hours as needed for muscle spasms. 09/05/19   Glade Lloyd, MD  mirtazapine (REMERON) 15 MG tablet Take 15 mg by mouth at bedtime.    [provider]  Multiple Vitamins-Minerals (VITRUM SENIOR) TABS Take 1 tablet by mouth daily.  04/30/15   [provider]  omeprazole (PRILOSEC) 40 MG capsule TAKE 1 CAPSULE(40 MG) BY MOUTH DAILY Patient taking differently: Take 40 mg by mouth daily.  04/13/19   Kerrin Champagne, MD  oxyCODONE (OXY IR/ROXICODONE) 5 MG immediate release tablet Take 1 tablet (5 mg total) by mouth every 6 (six) hours as needed for moderate pain. Patient not taking: Reported on 01/07/2020 09/05/19   Glade Lloyd, MD  oxyCODONE ER [facility]) 9 MG C12A Take 9 mg by mouth in the morning and at bedtime. Patient not taking: Reported on 01/07/2020 09/05/19   Glade Lloyd, MD  polyethylene glycol (MIRALAX / GLYCOLAX) 17 g packet Take 17 g by mouth 2 (two) times daily. 09/05/19   Glade Lloyd, MD  thiamine 100 MG tablet Take 1 tablet (100 mg total) by mouth daily. 09/05/19   Glade Lloyd, MD    Physical Exam:   Vitals:   01/07/20 1344 01/07/20 1400 01/07/20 1420 01/07/20 1434  BP: (!) 126/96 (!) 134/100 (!) 128/98   Pulse: (!) 115 (!) 110 (!) 120 (!) 110  Resp: 15 16 18    Temp: 98.9 F (37.2 C) 99.1 F (37.3 C) 99.1 F (37.3 C)   TempSrc: Oral Oral Oral   SpO2: 100% 100% 99%   Weight:  83.6 kg    Height:  5\' 9"  (1.753 m)       Physical Exam: Blood pressure (!) 128/98, pulse (!) 110, temperature 99.1 F (37.3 C), temperature source Oral, resp. rate 18, height 5\' 9"  (1.753 m), weight 83.6 kg, SpO2 99 %. Gen: Friendly man sitting on the side of the stretcher requesting food Eyes: sclera anicteric, conjuctiva mildly injected bilaterally CVS: S1-S2, regulary, no gallops Respiratory:  decreased air entry likely secondary to decreased inspiratory effort GI: NABS, soft, NT  LE: No edema. No cyanosis Neuro: A/O x 3, Moving all  extremities equally with normal strength.  More detailed exam per neurosurgery consultation Psych: patient is logical and coherent, mood and affect appropriate to situation. Skin: no rashes or lesions or ulcers,    Data Review:    Labs: Basic Metabolic Panel: Recent Labs  Lab 01/07/20 1242  NA 134*  K 4.2  CL 101  CO2 23  GLUCOSE 156*  BUN 7  CREATININE 0.92  CALCIUM 8.9   Liver Function Tests: Recent Labs  Lab 01/07/20 1242  AST 29  ALT 8  ALKPHOS 98  BILITOT 0.8  PROT 6.3*  ALBUMIN 3.3*   No results for input(s): LIPASE, AMYLASE in the last 168 hours. No results for input(s): AMMONIA in the last 168 hours. CBC: Recent Labs  Lab 01/07/20 1242  WBC 7.6  NEUTROABS 5.8  HGB 10.9*  HCT 35.1*  MCV 85.2  PLT 390   Cardiac Enzymes: No results for input(s): CKTOTAL, CKMB, CKMBINDEX, TROPONINI in the last 168 hours.  BNP (last 3 results) No results for input(s): PROBNP in the last 8760 hours. CBG: No results for input(s): GLUCAP in the last 168 hours.  Urinalysis    Component Value Date/Time   COLORURINE YELLOW 07/25/2019 1805   APPEARANCEUR HAZY (A) 07/25/2019 1805   LABSPEC 1.011 07/25/2019 1805   PHURINE 6.0 07/25/2019 1805   GLUCOSEU NEGATIVE 07/25/2019 1805   HGBUR SMALL (A) 07/25/2019 1805   BILIRUBINUR NEGATIVE 07/25/2019 1805   BILIRUBINUR negative 04/10/2016 1702   KETONESUR NEGATIVE 07/25/2019 1805   PROTEINUR 30 (A) 07/25/2019 1805   UROBILINOGEN 0.2 04/10/2016 1702   UROBILINOGEN 0.2 12/04/2013 1044   NITRITE NEGATIVE 07/25/2019 1805   LEUKOCYTESUR SMALL (A) 07/25/2019 1805      Radiographic Studies: DG Ribs Unilateral W/Chest Left  Result Date: 01/06/2020 CLINICAL DATA:  Pain status post fall EXAM: LEFT RIBS AND CHEST - 3+ VIEW COMPARISON:  Aug 02, 2019 FINDINGS: The previously demonstrated right-sided PICC line has been removed. There is no pneumothorax. No pleural effusion. Bronchitic changes are again noted at the lung bases. The  heart size is stable. There is an acute, nondisplaced fracture involving the anterolateral left ninth rib. IMPRESSION: Acute, nondisplaced fracture involving the anterolateral left ninth rib. No pneumothorax or pleural effusion. Electronically Signed   By: Katherine Mantle M.D.   On: 01/06/2020 21:31   DG Lumbar Spine Complete  Result Date: 01/06/2020 CLINICAL DATA:  Pain EXAM: LUMBAR SPINE - COMPLETE 4+ VIEW COMPARISON:  Aug 01, 2019 FINDINGS: The patient is status post prior L4-L5 posterior fusion. There is progressive destruction of the L1-L2 disc space, new since Aug 01, 2019. Multilevel degenerative changes are noted throughout the lumbar spine including multilevel facet arthrosis. IMPRESSION: 1. There are findings of progressive discitis osteomyelitis at the L1-L2 disc space. 2. No acute displaced fracture. 3. Multilevel degenerative changes are noted throughout the lumbar spine. Electronically Signed   By: Katherine Mantle M.D.   On: 01/06/2020 21:42   DG Shoulder Right  Result Date: 01/06/2020 CLINICAL DATA:  Pain status post fall EXAM: RIGHT SHOULDER - 2+ VIEW COMPARISON:  None. FINDINGS: There is moderate right glenohumeral osteoarthritis without evidence for an acute displaced fracture or dislocation. There are degenerative changes of the acromioclavicular joint. No evidence for a fracture involving the clavicle. IMPRESSION: 1. No acute displaced fracture or dislocation. 2. Moderate right glenohumeral osteoarthritis. Electronically Signed   By: Katherine Mantle M.D.   On: 01/06/2020 21:29  MR Lumbar Spine W Wo Contrast  Result Date: 01/07/2020 CLINICAL DATA:  Initial evaluation for low back pain, infection suspected. EXAM: MRI LUMBAR SPINE WITHOUT AND WITH CONTRAST TECHNIQUE: Multiplanar and multiecho pulse sequences of the lumbar spine were obtained without and with intravenous contrast. CONTRAST:  7.86mL GADAVIST GADOBUTROL 1 MMOL/ML IV SOLN COMPARISON:  Prior MRI from 08/04/2019.  FINDINGS: Segmentation: Standard. Same numbering system employed as on previous exam. Alignment: Straightening of the normal lumbar lordosis. Slight focal kyphotic angulation at L1-2, progressed from previous. Two-3 mm retrolisthesis of L2 on L3 and L3 on L4, similar. Chronic 3 mm anterolisthesis of L4 on L5 also unchanged. Vertebrae: Progressive findings of osteomyelitis discitis about the L1-2 interspace with progressive disc space height loss and endplate irregularity. Relatively mild residual endplate edema and enhancement, with no other significant findings for osteomyelitis discitis at this level, improved relative to previous exam. Now seen is abnormal marrow edema and enhancement about the right L3-4 facet, concerning for possible septic arthritis (series 5, image 6). Associated trace joint effusion. There is abnormal epidural phlegmon and enhancement extending from the T12-L1 level inferiorly through the distal aspect of the thecal sac (series 20, image 10). Involvement of the epidural space is fairly circumferential in nature, and most pronounced at the levels of L3 and L4. Possible superimposed small epidural abscess at the right dorsal epidural space at the level of L4-5, measuring up to 6 mm in diameter (series 9, image 23). Resultant moderate to severe diffuse spinal stenosis extending from L1-2 inferiorly through the distal thecal sac, most pronounced at L3-4 and L4-5. No evidence for acute or interval fracture. Underlying bone marrow signal intensity within normal limits. No worrisome osseous lesions. Conus medullaris and cauda equina: Conus extends to the L1 level. Conus medullaris itself is relatively normal in appearance. Nerve roots of the cauda equina are irregular and undulating related to underlying stenosis. Paraspinal and other soft tissues: Abnormal paraspinous edema and enhancement within the right greater than left posterior paraspinous soft tissues, which could reflect changes related to  acute myositis and/or postoperative changes. No soft tissue abscess or drainable fluid collection. Few small simple cyst noted within the posterior right hepatic lobe and interpolar left kidney. Visualized visceral structures otherwise unremarkable. Disc levels: T12-L1: Mild disc bulge with disc desiccation. Small amount of dorsal epidural phlegmon. Mild facet hypertrophy. No significant spinal stenosis. Foramina remain patent. L1-2: Progressive changes of osteomyelitis discitis with progressive disc space height loss and endplate irregularity and slight focal kyphotic angulation. Resultant circumferential disc osteophyte complex. Circumferential epidural phlegmon and enhancement. Mild facet hypertrophy. Resultant moderate to severe spinal stenosis. Fairly severe bilateral L1 foraminal narrowing. L2-3: Diffuse disc bulge with disc desiccation and intervertebral disc space narrowing. Superimposed right extraforaminal disc protrusion (series 9, image 14). Circumferential epidural phlegmon. Mild facet hypertrophy. Resultant severe spinal stenosis. Foramina remain patent. L3-4: Diffuse disc bulge with disc desiccation. Moderate bilateral facet hypertrophy with findings concerning for acute septic arthritis at the right L3-4 facet. Circumferential epidural phlegmon and enhancement. Resultant severe spinal stenosis with near complete effacement of the thecal sac. Mild to moderate right worse than left L3 foraminal narrowing. L4-5: Sequelae of prior PLIF. Circumferential epidural phlegmon and enhancement with possible 6 mm epidural abscess at the dorsal epidural space (series 9, image 23). Resultant moderate to severe spinal stenosis. Nerve roots of the cauda equina demonstrate a trident morphology due to proximal stenosis and/or arachnoiditis. Foramina remain patent. L5-S1: Negative interspace. Epidural phlegmon and enhancement without significant spinal stenosis.  Foramina remain patent. IMPRESSION: 1. Findings concerning  for acute septic arthritis involving the right L3-4 facet. Associated circumferential epidural phlegmon and enhancement extending from T12-L1 inferiorly through the distal thecal sac. Resultant moderate to severe diffuse spinal stenosis extending from L1-2 inferiorly as above. 2. Abnormal paraspinous edema and enhancement within the right greater than left posterior paraspinous soft tissues, nonspecific, but could reflect changes related to acute myositis and/or postoperative changes. No soft tissue abscess or drainable fluid collection. 3. Interval improvement changes related to osteomyelitis discitis about the L1-2 interspace, with only mild residual endplate edema enhancement now seen at this time. 4. Underlying multilevel degenerative spondylosis as above. Electronically Signed   By: Rise Mu M.D.   On: 01/07/2020 02:51    EKG: Ordered and pending   Assessment/Plan:   Principal Problem:   Acute osteomyelitis of lumbar spine (HCC) Active Problems:   Cocaine abuse (HCC)   Benign essential HTN   History of CVA (cerebrovascular accident)   Chronic pain syndrome  54 year old man with recent treatment for L1-L2 discitis now presents status post fall is found to have what appears to be septic arthritis and L3-L4.  Septic arthritis and L3-L4 Seen by neurosurgery earlier today, they note no need for urgent NS intervention at present They can recommend IR biopsy and culture of L2-3-4 space. Discussed with IR, Dr. Deanne Coffer, patient can be scheduled for biopsy in the morning.  IR consult placed. Discussed with ID, Dr. Luciana Axe, who recommended holding antibiotics until biopsy has been done,  After biopsy, Dr., Recommended restarting vancomycin and ceftriaxone pending culture results. Since biopsy is to be done tomorrow morning, no antibiotics have been started at present.  HTN Continue losartan  Ongoing cocaine use, history of EtOH use No longer using alcohol, last use was 5 years  ago Continues to use cocaine Patient is in precontemplation regarding cessation of cocaine  COPD As needed nebulizers ordered  Schizophrenia Hold Invega, supposed to get it every 3 months and apparently last got last month Continue bupropion and mirtazapine Continue Cogentin and gabapentin  GERD Continue PPI  Other information:   DVT prophylaxis: SCDs ordered until biopsy is done tomorrow, thereafter he can start enoxaparin unless further intervention is considered.   Code Status: Full Family Communication: No need to contact family per patient Disposition Plan: Home Consults called: Neurosurgery Admission status: Inpatient  Amori Colomb Tublu Jaaron Oleson [facility]  If 7PM-7AM, please contact night-coverage [email or web address] Password [email or web address] 01/07/2020, 2:49 PM

## 2020-01-08 ENCOUNTER — Inpatient Hospital Stay (HOSPITAL_COMMUNITY): Payer: Medicare Other

## 2020-01-08 DIAGNOSIS — M4626 Osteomyelitis of vertebra, lumbar region: Secondary | ICD-10-CM | POA: Diagnosis not present

## 2020-01-08 HISTORY — PX: IR LUMBAR DISC ASPIRATION W/IMG GUIDE: IMG5306

## 2020-01-08 LAB — BASIC METABOLIC PANEL
Anion gap: 9 (ref 5–15)
BUN: 9 mg/dL (ref 6–20)
CO2: 24 mmol/L (ref 22–32)
Calcium: 9 mg/dL (ref 8.9–10.3)
Chloride: 101 mmol/L (ref 98–111)
Creatinine, Ser: 0.73 mg/dL (ref 0.61–1.24)
GFR, Estimated: >60 mL/min (ref >60)
Glucose, Bld: 126 mg/dL — ABNORMAL HIGH (ref 70–99)
Potassium: 4.1 mmol/L (ref 3.5–5.1)
Sodium: 134 mmol/L — ABNORMAL LOW (ref 135–145)

## 2020-01-08 LAB — CBC
HCT: 33.4 % — ABNORMAL LOW (ref 39.0–52.0)
Hemoglobin: 10.7 g/dL — ABNORMAL LOW (ref 13.0–17.0)
MCH: 26.6 pg (ref 26.0–34.0)
MCHC: 32 g/dL (ref 30.0–36.0)
MCV: 83.1 fL (ref 80.0–100.0)
Platelets: 386 10*3/uL (ref 150–400)
RBC: 4.02 MIL/uL — ABNORMAL LOW (ref 4.22–5.81)
RDW: 15.6 % — ABNORMAL HIGH (ref 11.5–15.5)
WBC: 8.1 10*3/uL (ref 4.0–10.5)
nRBC: 0 % (ref 0.0–0.2)

## 2020-01-08 LAB — ANAEROBIC CULTURE

## 2020-01-08 MED ORDER — FENTANYL CITRATE (PF) 100 MCG/2ML IJ SOLN
INTRAMUSCULAR | Status: AC
  Filled 2020-01-08: qty 2

## 2020-01-08 MED ORDER — VANCOMYCIN HCL 1500 MG/300ML IV SOLN
1500.0000 mg | Freq: Two times a day (BID) | INTRAVENOUS | Status: DC
  Administered 2020-01-09 – 2020-01-17 (×18): 1500 mg via INTRAVENOUS
  Filled 2020-01-08 (×20): qty 300

## 2020-01-08 MED ORDER — MIDAZOLAM HCL 2 MG/2ML IJ SOLN
INTRAMUSCULAR | Status: AC | PRN
  Administered 2020-01-08 (×2): 1 mg via INTRAVENOUS

## 2020-01-08 MED ORDER — MIDAZOLAM HCL 2 MG/2ML IJ SOLN
INTRAMUSCULAR | Status: AC
  Filled 2020-01-08: qty 2

## 2020-01-08 MED ORDER — BUPIVACAINE HCL (PF) 0.5 % IJ SOLN
INTRAMUSCULAR | Status: AC
  Filled 2020-01-08: qty 30

## 2020-01-08 MED ORDER — LIDOCAINE HCL (PF) 1 % IJ SOLN
INTRAMUSCULAR | Status: AC
  Filled 2020-01-08: qty 30

## 2020-01-08 MED ORDER — SODIUM CHLORIDE 0.9 % IV SOLN
2.0000 g | INTRAVENOUS | Status: DC
  Administered 2020-01-08 – 2020-01-10 (×3): 2 g via INTRAVENOUS
  Filled 2020-01-08 (×3): qty 20

## 2020-01-08 MED ORDER — FENTANYL CITRATE (PF) 100 MCG/2ML IJ SOLN
INTRAMUSCULAR | Status: AC | PRN
  Administered 2020-01-08 (×3): 25 ug via INTRAVENOUS

## 2020-01-08 MED ORDER — VANCOMYCIN HCL 1750 MG/350ML IV SOLN
1750.0000 mg | Freq: Once | INTRAVENOUS | Status: AC
  Administered 2020-01-08: 1750 mg via INTRAVENOUS
  Filled 2020-01-08: qty 350

## 2020-01-08 NOTE — Progress Notes (Signed)
   01/07/20 2323  Assess: MEWS Score  Temp 99 F (37.2 C)  BP (!) 130/96  Pulse Rate (!) 113  SpO2 97 %  O2 Device Room Air  Assess: MEWS Score  MEWS Temp 0  MEWS Systolic 0  MEWS Pulse 2  MEWS RR 0  MEWS LOC 0  MEWS Score 2  MEWS Score Color Yellow  Assess: if the MEWS score is Yellow or Red  Were vital signs taken at a resting state? Yes  Focused Assessment Change from prior assessment (see assessment flowsheet)  Early Detection of Sepsis Score *See Row Information* Low  MEWS guidelines implemented *See Row Information* Yes  Treat  MEWS Interventions Escalated (See documentation below)  Pain Scale 0-10  Pain Score 7  Pain Type Acute pain  Pain Location Back  Pain Descriptors / Indicators Constant  Take Vital Signs  Increase Vital Sign Frequency  Yellow: Q 2hr X 2 then Q 4hr X 2, if remains yellow, continue Q 4hrs  Escalate  MEWS: Escalate Yellow: discuss with charge nurse/RN and consider discussing with provider and RRT  Notify: Charge Nurse/RN  Name of Charge Nurse/RN Notified Kimberly, RN  Date Charge Nurse/RN Notified 01/07/20  Time Charge Nurse/RN Notified 2330  Notify: Provider  Provider Name/Title N/A  Notify: Rapid Response  Name of Rapid Response RN Notified N/A  Document  Patient Outcome Stabilized after interventions  Progress note created (see row info) Yes

## 2020-01-08 NOTE — Procedures (Signed)
S/P L1 core bone biopsy x 2 passes. Disc aspiration unsuccessful due to severe disc space narrowing and  overhanging degenerative osteophytes. S.DEveshgwar MD

## 2020-01-08 NOTE — Progress Notes (Signed)
Pharmacy Antibiotic Note  [name] is a 54 y.o. male admitted on 01/06/2020 with prior history of lumbar osteomyelitis/discitis in May-June 2021 now with back pain post fall at home and MRI showing lumbar osteomyelitis.  Neurosurgery unable to get successful disc aspiration during biopsy. Pharmacy has been consulted for Vancomycin dosing. Patient is already on Ceftriaxone.   WBC wnl. Afebrile. SCR 0.73 - within normal limits.  Patient received Vancomycin in May-June and had therapeutic levels on 1500g IV every 12 hours with similar SCr/CrCl.   Plan: Vancomycin 1750mg  IV x1 then 1500mg  IV every 12 hours CTX per MD - 2g q24h Monitor clinical status, renal function, culture results  Height: 5\' 9"  (175.3 cm) Weight: 85.3 kg (188 lb 2.3 oz) IBW/kg (Calculated) : 70.7  Temp (24hrs), Avg:98.7 F (37.1 C), Min:98 F (36.7 C), Max:99 F (37.2 C)  Recent Labs  Lab 01/07/20 1242 01/08/20 0150  WBC 7.6 8.1  CREATININE 0.92 0.73    Estimated Creatinine Clearance: 114.2 mL/min (by C-G formula based on SCr of 0.73 mg/dL).    Allergies  Allergen Reactions  . Chlorhexidine Itching and Rash    Antimicrobials this admission: CTX 10/11 >> Vanc 10/11 >>  Dose adjustments this admission:   Microbiology results: 10/11 Disc Fluid  10/11 Blood cx >>  Thank you for allowing pharmacy to be a part of this patient's care.  Sloan Leiter, PharmD, BCPS, BCCCP Clinical Pharmacist Please refer to [facility] for [place] numbers 01/08/2020 6:46 PM

## 2020-01-08 NOTE — Progress Notes (Signed)
Interventional Radiology Brief Note:  IR not available for facet aspiration until Thursday of this week at the earliest.  Patient's NPO order lifted for today as no procedure is planned.  Spoke with Dr. Tyrell Antonio.   Brynda Greathouse, MS RD PA-C 10:00 AM

## 2020-01-08 NOTE — Consult Note (Signed)
Chief Complaint: Patient was seen in consultation today for L1-L2 discitis   Referring Physician(s): Dr. Zada Finders  Supervising Physician: Luanne Bras  Patient Status: [facility] - In-pt  History of Present Illness: [name] is a 54 y.o. male with past medical history of lumbar laminectomy/fusion, polysubstance abuse, schizophrenia, CVA, asthma, and GERD who presented with back pain after a fall at home.  Patient has history of lumbar spinal surgery/intervention with prior infections- previously treated for osteomyelitis/discitis in June 2021. When his pain did not improve after fall at home he presented to ED for assessment.   MR Lumbar Spine 10/10 showed: 1. Findings concerning for acute septic arthritis involving the right L3-4 facet. Associated circumferential epidural phlegmon and enhancement extending from T12-L1 inferiorly through the distal thecal sac. Resultant moderate to severe diffuse spinal stenosis extending from L1-2 inferiorly as above. 2. Abnormal paraspinous edema and enhancement within the right greater than left posterior paraspinous soft tissues, nonspecific, but could reflect changes related to acute myositis and/or postoperative changes. No soft tissue abscess or drainable fluid collection. 3. Interval improvement changes related to osteomyelitis discitis about the L1-2 interspace, with only mild residual endplate edema enhancement now seen at this time. 4. Underlying multilevel degenerative spondylosis as above.  IR consulted for aspiration at the request of Dr. Jamse Arn.   Patient assessed at bedside.  States he cannot feel his feet.  Thought he had hurt himself with fall and that soreness would improve, however after 1-2 days back pain was worse and he sought evaluation.  He has been NPO this AM.   Past Medical History:  Diagnosis Date  . Anxiety   . Arthritis   . Asthma   . Depression   . Duodenal adenoma   . GERD (gastroesophageal  reflux disease)   . Hypertension   . Pneumonia    hx  . Post-traumatic osteoarthritis of left ankle   . Pre-diabetes   . Presence of right artificial knee joint 12/17/2016  . Schizophrenia ([place])   . Stroke [facility])    2009-or10    Past Surgical History:  Procedure Laterality Date  . ANKLE ARTHROSCOPY Left 10/20/2017   Procedure: ANKLE ARTHROSCOPY;  Surgeon: Newt Minion, MD;  Location: [place];  Service: Orthopedics;  Laterality: Left;  . ANKLE FUSION Left 10/20/2017  . ANKLE FUSION Left 10/20/2017   Procedure: LEFT ANKLE FUSION;  Surgeon: Newt Minion, MD;  Location: [place];  Service: Orthopedics;  Laterality: Left;  . ESOPHAGOGASTRODUODENOSCOPY Left 12/05/2013   Procedure: ESOPHAGOGASTRODUODENOSCOPY (EGD);  Surgeon: Arta Silence, MD;  Location: [facility];  Service: Endoscopy;  Laterality: Left;  . ESOPHAGOGASTRODUODENOSCOPY (EGD) WITH PROPOFOL N/A 02/20/2015   Procedure: ESOPHAGOGASTRODUODENOSCOPY (EGD) WITH PROPOFOL;  Surgeon: Wilford Corner, MD;  Location: [facility];  Service: Endoscopy;  Laterality: N/A;  . HARDWARE REMOVAL Left 01/29/2018   Procedure: HARDWARE REMOVAL LEFT ANKLE;  Surgeon: Newt Minion, MD;  Location: [place];  Service: Orthopedics;  Laterality: Left;  . JOINT REPLACEMENT    . TOTAL KNEE ARTHROPLASTY Right 07/21/2016   Procedure: TOTAL KNEE ARTHROPLASTY;  Surgeon: Meredith Pel, MD;  Location: [place];  Service: Orthopedics;  Laterality: Right;  . UPPER GASTROINTESTINAL ENDOSCOPY      Allergies: Chlorhexidine  Medications: Prior to Admission medications   Medication Sig Start Date End Date Taking? Authorizing Provider  benztropine (COGENTIN) 1 MG tablet Take 1 mg by mouth daily.   Yes [provider]  buPROPion (WELLBUTRIN XL) 300 MG 24 hr tablet Take 300  mg by mouth daily.   Yes [provider]  cetirizine (ZYRTEC) 10 MG tablet TAKE 1 TABLET BY MOUTH ONCE DAILY. Patient taking differently: Take 10 mg by mouth daily. No Therapeutic  Substitution 03/28/18  Yes Diallo, Abdoulaye, MD  gabapentin (NEURONTIN) 300 MG capsule Take 1 capsule (300 mg total) by mouth 2 (two) times daily. 10/23/19  Yes Jessy Oto, MD  ibuprofen (ADVIL) 200 MG tablet Take 200 mg by mouth every 6 (six) hours as needed for fever, headache or moderate pain.   Yes [provider]  INVEGA TRINZA 819 MG/2.625ML SUSY Inject 819 mg into the skin every 3 (three) months. 09/08/17  Yes [provider]  losartan (COZAAR) 100 MG tablet Take 100 mg by mouth daily. 01/05/20  Yes [provider]  Melatonin 5 MG TABS Take 5 mg by mouth at bedtime as needed (sleep).  02/28/19  Yes [provider]  mirtazapine (REMERON) 15 MG tablet Take 15 mg by mouth at bedtime.   Yes [provider]  Multiple Vitamins-Minerals (VITRUM SENIOR) TABS Take 1 tablet by mouth daily.  04/30/15  Yes [provider]  omeprazole (PRILOSEC) 40 MG capsule TAKE 1 CAPSULE(40 MG) BY MOUTH DAILY Patient taking differently: Take 40 mg by mouth daily.  04/13/19  Yes Jessy Oto, MD  albuterol (PROVENTIL HFA;VENTOLIN HFA) 108 (90 Base) MCG/ACT inhaler Inhale 1 puff into the lungs 2 (two) times daily as needed for wheezing or shortness of breath. Patient not taking: Reported on 01/07/2020 05/14/17   Marjie Skiff, MD  diltiazem (CARDIZEM CD) 240 MG 24 hr capsule Take 1 capsule (240 mg total) by mouth daily. Patient not taking: Reported on 01/07/2020 09/05/19   Aline August, MD  folic acid (FOLVITE) 1 MG tablet Take 1 tablet (1 mg total) by mouth daily. Patient not taking: Reported on 01/07/2020 09/05/19   Aline August, MD  ibuprofen (ADVIL) 400 MG tablet Take 1 tablet (400 mg total) by mouth 3 (three) times daily. Patient not taking: Reported on 01/07/2020 09/05/19   Aline August, MD  lidocaine (LIDODERM) 5 % Place 1 patch onto the skin daily. Remove & Discard patch within 12 hours or as directed by MD Patient not taking: Reported on 01/07/2020  09/05/19   Aline August, MD  losartan (COZAAR) 50 MG tablet Take 1 tablet (50 mg total) by mouth daily. Patient not taking: Reported on 01/07/2020 09/05/19   Aline August, MD  methocarbamol (ROBAXIN) 750 MG tablet Take 1 tablet (750 mg total) by mouth every 6 (six) hours as needed for muscle spasms. Patient not taking: Reported on 01/07/2020 09/05/19   Aline August, MD  oxyCODONE (OXY IR/ROXICODONE) 5 MG immediate release tablet Take 1 tablet (5 mg total) by mouth every 6 (six) hours as needed for moderate pain. Patient not taking: Reported on 01/07/2020 09/05/19   Aline August, MD  oxyCODONE ER [facility] ER) 9 MG C12A Take 9 mg by mouth in the morning and at bedtime. Patient not taking: Reported on 01/07/2020 09/05/19   Aline August, MD  polyethylene glycol (MIRALAX / GLYCOLAX) 17 g packet Take 17 g by mouth 2 (two) times daily. Patient not taking: Reported on 01/07/2020 09/05/19   Aline August, MD  thiamine 100 MG tablet Take 1 tablet (100 mg total) by mouth daily. Patient not taking: Reported on 01/07/2020 09/05/19   Aline August, MD     Family History  Adopted: Yes  Problem Relation Age of Onset  . Colon cancer Neg Hx   .  Esophageal cancer Neg Hx   . Rectal cancer Neg Hx   . Stomach cancer Neg Hx     Social History   Socioeconomic History  . Marital status: Significant Other    Spouse name: Not on file  . Number of children: Not on file  . Years of education: Not on file  . Highest education level: Not on file  Occupational History  . Not on file  Tobacco Use  . Smoking status: Heavy Tobacco Smoker    Packs/day: 1.50    Years: 38.00    Pack years: 57.00    Types: Cigarettes  . Smokeless tobacco: Never Used  . Tobacco comment: onset age 54; upto 1.5 ppd  Vaping Use  . Vaping Use: Never used  Substance and Sexual Activity  . Alcohol use: Not Currently    Alcohol/week: 3.0 standard drinks    Types: 3 Cans of beer per week    Comment: quit 2015; formerly up to 2  fifths/day  . Drug use: No  . Sexual activity: Yes    Comment: declined condoms  Other Topics Concern  . Not on file  Social History Narrative  . Not on file   Social Determinants of Health   Financial Resource Strain:   . Difficulty of Paying Living Expenses: Not on file  Food Insecurity:   . Worried About Charity fundraiser in the Last Year: Not on file  . Ran Out of Food in the Last Year: Not on file  Transportation Needs:   . Lack of Transportation (Medical): Not on file  . Lack of Transportation (Non-Medical): Not on file  Physical Activity:   . Days of Exercise per Week: Not on file  . Minutes of Exercise per Session: Not on file  Stress:   . Feeling of Stress : Not on file  Social Connections:   . Frequency of Communication with Friends and Family: Not on file  . Frequency of Social Gatherings with Friends and Family: Not on file  . Attends Religious Services: Not on file  . Active Member of Clubs or Organizations: Not on file  . Attends Archivist Meetings: Not on file  . Marital Status: Not on file     Review of Systems: A 12 point ROS discussed and pertinent positives are indicated in the HPI above.  All other systems are negative.  Review of Systems  Constitutional: Negative for fatigue and fever.  Respiratory: Negative for cough and shortness of breath.   Cardiovascular: Negative for chest pain.  Gastrointestinal: Negative for abdominal pain, nausea and vomiting.  Musculoskeletal: Positive for back pain.  Neurological: Positive for numbness (feet).  Psychiatric/Behavioral: Negative for behavioral problems and confusion.    Vital Signs: BP 122/90 (BP Location: Right Arm)   Pulse (!) 107   Temp 98.4 F (36.9 C)   Resp 19   Ht 5\' 9"  (1.753 m)   Wt 188 lb 2.3 oz (85.3 kg)   SpO2 96%   BMI 27.78 kg/m   Physical Exam Vitals and nursing note reviewed.  Constitutional:      Appearance: Normal appearance.  HENT:     Mouth/Throat:      Mouth: Mucous membranes are moist.     Pharynx: Oropharynx is clear.  Cardiovascular:     Rate and Rhythm: Normal rate and regular rhythm.  Pulmonary:     Effort: Pulmonary effort is normal. No respiratory distress.     Breath sounds: Normal breath sounds.  Abdominal:  General: Abdomen is flat.     Palpations: Abdomen is soft.  Musculoskeletal:     Cervical back: Normal range of motion and neck supple.  Skin:    General: Skin is warm and dry.  Neurological:     General: No focal deficit present.     Mental Status: He is alert and oriented to person, place, and time. Mental status is at baseline.      MD Evaluation Airway: WNL Heart: WNL Abdomen: WNL Chest/ Lungs: WNL ASA  Classification: 3 Mallampati/Airway Score: One   Imaging: DG Ribs Unilateral W/Chest Left  Result Date: 01/06/2020 CLINICAL DATA:  Pain status post fall EXAM: LEFT RIBS AND CHEST - 3+ VIEW COMPARISON:  Aug 02, 2019 FINDINGS: The previously demonstrated right-sided PICC line has been removed. There is no pneumothorax. No pleural effusion. Bronchitic changes are again noted at the lung bases. The heart size is stable. There is an acute, nondisplaced fracture involving the anterolateral left ninth rib. IMPRESSION: Acute, nondisplaced fracture involving the anterolateral left ninth rib. No pneumothorax or pleural effusion. Electronically Signed   By: Constance Holster M.D.   On: 01/06/2020 21:31   DG Lumbar Spine Complete  Result Date: 01/06/2020 CLINICAL DATA:  Pain EXAM: LUMBAR SPINE - COMPLETE 4+ VIEW COMPARISON:  Aug 01, 2019 FINDINGS: The patient is status post prior L4-L5 posterior fusion. There is progressive destruction of the L1-L2 disc space, new since Aug 01, 2019. Multilevel degenerative changes are noted throughout the lumbar spine including multilevel facet arthrosis. IMPRESSION: 1. There are findings of progressive discitis osteomyelitis at the L1-L2 disc space. 2. No acute displaced fracture. 3.  Multilevel degenerative changes are noted throughout the lumbar spine. Electronically Signed   By: Constance Holster M.D.   On: 01/06/2020 21:42   DG Shoulder Right  Result Date: 01/06/2020 CLINICAL DATA:  Pain status post fall EXAM: RIGHT SHOULDER - 2+ VIEW COMPARISON:  None. FINDINGS: There is moderate right glenohumeral osteoarthritis without evidence for an acute displaced fracture or dislocation. There are degenerative changes of the acromioclavicular joint. No evidence for a fracture involving the clavicle. IMPRESSION: 1. No acute displaced fracture or dislocation. 2. Moderate right glenohumeral osteoarthritis. Electronically Signed   By: Constance Holster M.D.   On: 01/06/2020 21:29   MR Lumbar Spine W Wo Contrast  Result Date: 01/07/2020 CLINICAL DATA:  Initial evaluation for low back pain, infection suspected. EXAM: MRI LUMBAR SPINE WITHOUT AND WITH CONTRAST TECHNIQUE: Multiplanar and multiecho pulse sequences of the lumbar spine were obtained without and with intravenous contrast. CONTRAST:  7.75mL GADAVIST GADOBUTROL 1 MMOL/ML IV SOLN COMPARISON:  Prior MRI from 08/04/2019. FINDINGS: Segmentation: Standard. Same numbering system employed as on previous exam. Alignment: Straightening of the normal lumbar lordosis. Slight focal kyphotic angulation at L1-2, progressed from previous. Two-3 mm retrolisthesis of L2 on L3 and L3 on L4, similar. Chronic 3 mm anterolisthesis of L4 on L5 also unchanged. Vertebrae: Progressive findings of osteomyelitis discitis about the L1-2 interspace with progressive disc space height loss and endplate irregularity. Relatively mild residual endplate edema and enhancement, with no other significant findings for osteomyelitis discitis at this level, improved relative to previous exam. Now seen is abnormal marrow edema and enhancement about the right L3-4 facet, concerning for possible septic arthritis (series 5, image 6). Associated trace joint effusion. There is abnormal  epidural phlegmon and enhancement extending from the T12-L1 level inferiorly through the distal aspect of the thecal sac (series 20, image 10). Involvement of the epidural space is  fairly circumferential in nature, and most pronounced at the levels of L3 and L4. Possible superimposed small epidural abscess at the right dorsal epidural space at the level of L4-5, measuring up to 6 mm in diameter (series 9, image 23). Resultant moderate to severe diffuse spinal stenosis extending from L1-2 inferiorly through the distal thecal sac, most pronounced at L3-4 and L4-5. No evidence for acute or interval fracture. Underlying bone marrow signal intensity within normal limits. No worrisome osseous lesions. Conus medullaris and cauda equina: Conus extends to the L1 level. Conus medullaris itself is relatively normal in appearance. Nerve roots of the cauda equina are irregular and undulating related to underlying stenosis. Paraspinal and other soft tissues: Abnormal paraspinous edema and enhancement within the right greater than left posterior paraspinous soft tissues, which could reflect changes related to acute myositis and/or postoperative changes. No soft tissue abscess or drainable fluid collection. Few small simple cyst noted within the posterior right hepatic lobe and interpolar left kidney. Visualized visceral structures otherwise unremarkable. Disc levels: T12-L1: Mild disc bulge with disc desiccation. Small amount of dorsal epidural phlegmon. Mild facet hypertrophy. No significant spinal stenosis. Foramina remain patent. L1-2: Progressive changes of osteomyelitis discitis with progressive disc space height loss and endplate irregularity and slight focal kyphotic angulation. Resultant circumferential disc osteophyte complex. Circumferential epidural phlegmon and enhancement. Mild facet hypertrophy. Resultant moderate to severe spinal stenosis. Fairly severe bilateral L1 foraminal narrowing. L2-3: Diffuse disc bulge with  disc desiccation and intervertebral disc space narrowing. Superimposed right extraforaminal disc protrusion (series 9, image 14). Circumferential epidural phlegmon. Mild facet hypertrophy. Resultant severe spinal stenosis. Foramina remain patent. L3-4: Diffuse disc bulge with disc desiccation. Moderate bilateral facet hypertrophy with findings concerning for acute septic arthritis at the right L3-4 facet. Circumferential epidural phlegmon and enhancement. Resultant severe spinal stenosis with near complete effacement of the thecal sac. Mild to moderate right worse than left L3 foraminal narrowing. L4-5: Sequelae of prior PLIF. Circumferential epidural phlegmon and enhancement with possible 6 mm epidural abscess at the dorsal epidural space (series 9, image 23). Resultant moderate to severe spinal stenosis. Nerve roots of the cauda equina demonstrate a trident morphology due to proximal stenosis and/or arachnoiditis. Foramina remain patent. L5-S1: Negative interspace. Epidural phlegmon and enhancement without significant spinal stenosis. Foramina remain patent. IMPRESSION: 1. Findings concerning for acute septic arthritis involving the right L3-4 facet. Associated circumferential epidural phlegmon and enhancement extending from T12-L1 inferiorly through the distal thecal sac. Resultant moderate to severe diffuse spinal stenosis extending from L1-2 inferiorly as above. 2. Abnormal paraspinous edema and enhancement within the right greater than left posterior paraspinous soft tissues, nonspecific, but could reflect changes related to acute myositis and/or postoperative changes. No soft tissue abscess or drainable fluid collection. 3. Interval improvement changes related to osteomyelitis discitis about the L1-2 interspace, with only mild residual endplate edema enhancement now seen at this time. 4. Underlying multilevel degenerative spondylosis as above. Electronically Signed   By: Jeannine Boga M.D.   On:  01/07/2020 02:51    Labs:  CBC: Recent Labs    08/30/19 0407 09/05/19 0341 01/07/20 1242 01/08/20 0150  WBC 6.8 6.8 7.6 8.1  HGB 10.1* 10.0* 10.9* 10.7*  HCT 33.4* 33.8* 35.1* 33.4*  PLT 327 329 390 386    COAGS: Recent Labs    04/07/19 1345  INR 1.0    BMP: Recent Labs    08/23/19 1148 08/23/19 1148 08/30/19 0407 08/30/19 0407 09/04/19 0252 09/05/19 0341 01/07/20 1242 01/08/20 0150  NA  136   < > 137  --   --  136 134* 134*  K 4.5   < > 4.5  --   --  4.2 4.2 4.1  CL 101   < > 102  --   --  101 101 101  CO2 25   < > 24  --   --  23 23 24   GLUCOSE 109*   < > 92  --   --  100* 156* 126*  BUN 13   < > 20  --   --  14 7 9   CALCIUM 8.9   < > 8.8*  --   --  8.9 8.9 9.0  CREATININE 0.63   < > 0.89   < > 0.73 0.71 0.92 0.73  GFRNONAA >60   < > >60   < > >60 >60 >60 >60  GFRAA >60  --  >60  --  >60 >60  --   --    < > = values in this interval not displayed.    LIVER FUNCTION TESTS: Recent Labs    08/03/19 0306 08/03/19 0306 08/04/19 0424 08/05/19 0330 08/08/19 0353 08/09/19 0324 09/05/19 0341 01/07/20 1242  BILITOT 0.4  --  0.3  --   --   --  0.4 0.8  AST 16  --  16  --   --   --  18 29  ALT 10  --  10  --   --   --  11 8  ALKPHOS 145*  --  147*  --   --   --  170* 98  PROT 6.6  --  6.7  --   --   --  6.7 6.3*  ALBUMIN 3.1*   < > 3.1*   < > 3.3* 3.2* 3.7 3.3*   < > = values in this interval not displayed.    TUMOR MARKERS: No results for input(s): AFPTM, CEA, CA199, CHROMGRNA in the last 8760 hours.  Assessment and Plan: Back pain L1-L2 osteomyelitis/discitis Septic arthritis involving the L3-L4 facet IR consulted for aspiration antibiotic targeting.  Case was reviewed with Dr. Vernard Gambles as L3-L4 facet aspiration which was approved with case to move forward once IR MD available on Thursday.  However, after further review of case and discussion between Dr. Estanislado Pandy and Dr. Zada Finders new plan made to proceed with aspiration of L1-L2 disc space vs. Bone  biopsy.  Patient has remained NPO today.  Plan to proceed as schedule allows.   Risks and benefits of L1-L2 disc aspiration vs. Bone biopsy was discussed with the patient and/or patient's family including, but not limited to bleeding, infection, damage to adjacent structures or low yield requiring additional tests.  All of the questions were answered and there is agreement to proceed.  Consent signed and in chart.   Thank you for this interesting consult.  I greatly enjoyed meeting [name] and look forward to participating in their care.  A copy of this report was sent to the requesting provider on this date.  Electronically Signed: Docia Barrier, PA 01/08/2020, 12:57 PM   I spent a total of 40 Minutes    in face to face in clinical consultation, greater than 50% of which was counseling/coordinating care for osteomyelitis, discitis.

## 2020-01-08 NOTE — Progress Notes (Addendum)
PROGRESS NOTE    YE ROCCHI  HKV:[id number] DOB: 21-Jan-1966 DOA: 01/06/2020 PCP: [name], MD   Brief Narrative: 54 year old with past medical history significant for discitis (April 2021 treated with 6 weeks of vancomycin and ceftriaxone, culture unrevealing), status post laminectomy, schizophrenia and ongoing cocaine use who presents complaining of shoulder pain and back pain after a mechanical fall 4 days prior to admission.  He denies fever or chills, he has persistent numbness laterally which is not new.  He denies any changes in his lower extremity strength. Evaluation in the ED imaging no isolated rib fracture without pneumothorax, LS-spine showed possible progressive discitis osteomyelitis.  MRI was performed which shows possible septic arthritis at L3-4 , associated epidural phlegmon and enhancement extending from T12 -L1.  Resultant moderate to severe diffuse spinal stenosis extending from L1 -L2.improved discitis at L1-2.   Patient was evaluated by neurosurgery, who commended medical treatment and IR guided disc space biopsy to guide antibiotics.   Assessment & Plan:   Principal Problem:   Acute osteomyelitis of lumbar spine (HCC) Active Problems:   Cocaine abuse (HCC)   Benign essential HTN   History of CVA (cerebrovascular accident)   Chronic pain syndrome  1-Septic Arthritis L 3-4, associated epidural phlegmon: -Plan for IR to proceed with disc aspiration culture by Dr Velia Meyer. Appreciate Dr Velia Meyer and Maurice Small help.  -Awaiting for patient to get aspiration to proceed with IV antibiotics.  -CRP 2.1  2-HTN;  Continue with Losartan.   3-Ongoing cocaine, ETOH use; Quit alcohol 5 years ago.  Counseling about cocaine.   4-COPD; PRN nebulizer.   5-Schizophrenia;  On Invega. Get it every 3 months.  Continue with Cogentin, Wellbutrin, Remeron.   6-GERD; PPI.   7-Mild Hyponatremia; monitor. Na 134.  8-Anemia; check anemia panel.  9-Acute non  displace Fracture involving the anterolateral left ninth Rib;  Pain management. Incentive spirometry.         Estimated body mass index is 27.78 kg/m as calculated from the following:   Height as of this encounter: 5\' 9"  (1.753 m).   Weight as of this encounter: 85.3 kg.   DVT prophylaxis: SCD Code Status: Full code Family Communication: care discussed with patient.  Disposition Plan:  Status is: Inpatient  Remains inpatient appropriate because:IV treatments appropriate due to intensity of illness or inability to take PO   Dispo: The patient is from: Home              Anticipated d/c is to: to be determine.               Anticipated d/c date is: 2 days              Patient currently is not medically stable to d/c.        Consultants:   IR  ID  Neurosurgery.   Procedures:   Disc aspiration Lumbar spine.   Antimicrobials:    Subjective: He is awaiting for procedure. Wants to eat.  Back pain, left side pain.   Objective: Vitals:   01/07/20 2123 01/07/20 2323 01/08/20 0135 01/08/20 0535  BP: (!) 155/104 (!) 130/96 (!) 141/94 118/75  Pulse: (!) 112 (!) 113 (!) 105 (!) 102  Resp: 20     Temp: 98.8 F (37.1 C) 99 F (37.2 C) 98 F (36.7 C) 98.7 F (37.1 C)  TempSrc: Oral Oral  Oral  SpO2: 98% 97% 98% 96%  Weight: 85.3 kg     Height:  Intake/Output Summary (Last 24 hours) at 01/08/2020 0809 Last data filed at 01/08/2020 0535 Gross per 24 hour  Intake 1500 ml  Output --  Net 1500 ml   Filed Weights   01/06/20 2035 01/07/20 1400 01/07/20 2123  Weight: 86.2 kg 83.6 kg 85.3 kg    Examination:  General exam: Appears calm and comfortable  Respiratory system: Clear to auscultation. Respiratory effort normal. Cardiovascular system: S1 & S2 heard, RRR. No JVD, murmurs, rubs, gallops or clicks. No pedal edema. Gastrointestinal system: Abdomen is nondistended, soft and nontender. No organomegaly or masses felt. Normal bowel sounds  heard. Central nervous system: Alert and oriented.  Extremities: Symmetric 5 x 5 power.     Data Reviewed: I have personally reviewed following labs and imaging studies  CBC: Recent Labs  Lab 01/07/20 1242 01/08/20 0150  WBC 7.6 8.1  NEUTROABS 5.8  --   HGB 10.9* 10.7*  HCT 35.1* 33.4*  MCV 85.2 83.1  PLT 390 386   Basic Metabolic Panel: Recent Labs  Lab 01/07/20 1242 01/08/20 0150  NA 134* 134*  K 4.2 4.1  CL 101 101  CO2 23 24  GLUCOSE 156* 126*  BUN 7 9  CREATININE 0.92 0.73  CALCIUM 8.9 9.0   GFR: Estimated Creatinine Clearance: 114.2 mL/min (by C-G formula based on SCr of 0.73 mg/dL). Liver Function Tests: Recent Labs  Lab 01/07/20 1242  AST 29  ALT 8  ALKPHOS 98  BILITOT 0.8  PROT 6.3*  ALBUMIN 3.3*   No results for input(s): LIPASE, AMYLASE in the last 168 hours. No results for input(s): AMMONIA in the last 168 hours. Coagulation Profile: No results for input(s): INR, PROTIME in the last 168 hours. Cardiac Enzymes: No results for input(s): CKTOTAL, CKMB, CKMBINDEX, TROPONINI in the last 168 hours. BNP (last 3 results) No results for input(s): PROBNP in the last 8760 hours. HbA1C: No results for input(s): HGBA1C in the last 72 hours. CBG: No results for input(s): GLUCAP in the last 168 hours. Lipid Profile: No results for input(s): CHOL, HDL, LDLCALC, TRIG, CHOLHDL, LDLDIRECT in the last 72 hours. Thyroid Function Tests: No results for input(s): TSH, T4TOTAL, FREET4, T3FREE, THYROIDAB in the last 72 hours. Anemia Panel: No results for input(s): VITAMINB12, FOLATE, FERRITIN, TIBC, IRON, RETICCTPCT in the last 72 hours. Sepsis Labs: No results for input(s): PROCALCITON, LATICACIDVEN in the last 168 hours.  Recent Results (from the past 240 hour(s))  Respiratory Panel by RT PCR (Flu A&B, Covid) - Nasopharyngeal Swab     Status: None   Collection Time: 01/07/20  3:09 AM   Specimen: Nasopharyngeal Swab  Result Value Ref Range Status   SARS  Coronavirus 2 by RT PCR NEGATIVE NEGATIVE Final    Comment: (NOTE) SARS-CoV-2 target nucleic acids are NOT DETECTED.  The SARS-CoV-2 RNA is generally detectable in upper respiratoy specimens during the acute phase of infection. The lowest concentration of SARS-CoV-2 viral copies this assay can detect is 131 copies/mL. A negative result does not preclude SARS-Cov-2 infection and should not be used as the sole basis for treatment or other patient management decisions. A negative result may occur with  improper specimen collection/handling, submission of specimen other than nasopharyngeal swab, presence of viral mutation(s) within the areas targeted by this assay, and inadequate number of viral copies (<131 copies/mL). A negative result must be combined with clinical observations, patient history, and epidemiological information. The expected result is Negative.  Fact Sheet for Patients:  https://www.moore.com/  Fact Sheet for Healthcare Providers:  https://www.young.biz/  This test is no t yet approved or cleared by the [place] and  has been authorized for detection and/or diagnosis of SARS-CoV-2 by FDA under an Emergency Use Authorization (EUA). This EUA will remain  in effect (meaning this test can be used) for the duration of the COVID-19 declaration under Section 564(b)(1) of the Act, 21 U.S.C. section 360bbb-3(b)(1), unless the authorization is terminated or revoked sooner.     Influenza A by PCR NEGATIVE NEGATIVE Final   Influenza B by PCR NEGATIVE NEGATIVE Final    Comment: (NOTE) The Xpert Xpress SARS-CoV-2/FLU/RSV assay is intended as an aid in  the diagnosis of influenza from Nasopharyngeal swab specimens and  should not be used as a sole basis for treatment. Nasal washings and  aspirates are unacceptable for Xpert Xpress SARS-CoV-2/FLU/RSV  testing.  Fact Sheet for  Patients: https://www.moore.com/  Fact Sheet for Healthcare Providers: https://www.young.biz/  This test is not yet approved or cleared by the [place] FDA and  has been authorized for detection and/or diagnosis of SARS-CoV-2 by  FDA under an Emergency Use Authorization (EUA). This EUA will remain  in effect (meaning this test can be used) for the duration of the  Covid-19 declaration under Section 564(b)(1) of the Act, 21  U.S.C. section 360bbb-3(b)(1), unless the authorization is  terminated or revoked. Performed at [facility], [address] [place], [place] [address]          Radiology Studies: DG Ribs Unilateral W/Chest Left  Result Date: 01/06/2020 CLINICAL DATA:  Pain status post fall EXAM: LEFT RIBS AND CHEST - 3+ VIEW COMPARISON:  Aug 02, 2019 FINDINGS: The previously demonstrated right-sided PICC line has been removed. There is no pneumothorax. No pleural effusion. Bronchitic changes are again noted at the lung bases. The heart size is stable. There is an acute, nondisplaced fracture involving the anterolateral left ninth rib. IMPRESSION: Acute, nondisplaced fracture involving the anterolateral left ninth rib. No pneumothorax or pleural effusion. Electronically Signed   By: Katherine Mantle M.D.   On: 01/06/2020 21:31   DG Lumbar Spine Complete  Result Date: 01/06/2020 CLINICAL DATA:  Pain EXAM: LUMBAR SPINE - COMPLETE 4+ VIEW COMPARISON:  Aug 01, 2019 FINDINGS: The patient is status post prior L4-L5 posterior fusion. There is progressive destruction of the L1-L2 disc space, new since Aug 01, 2019. Multilevel degenerative changes are noted throughout the lumbar spine including multilevel facet arthrosis. IMPRESSION: 1. There are findings of progressive discitis osteomyelitis at the L1-L2 disc space. 2. No acute displaced fracture. 3. Multilevel degenerative changes are noted throughout the lumbar spine. Electronically Signed    By: Katherine Mantle M.D.   On: 01/06/2020 21:42   DG Shoulder Right  Result Date: 01/06/2020 CLINICAL DATA:  Pain status post fall EXAM: RIGHT SHOULDER - 2+ VIEW COMPARISON:  None. FINDINGS: There is moderate right glenohumeral osteoarthritis without evidence for an acute displaced fracture or dislocation. There are degenerative changes of the acromioclavicular joint. No evidence for a fracture involving the clavicle. IMPRESSION: 1. No acute displaced fracture or dislocation. 2. Moderate right glenohumeral osteoarthritis. Electronically Signed   By: Katherine Mantle M.D.   On: 01/06/2020 21:29   MR Lumbar Spine W Wo Contrast  Result Date: 01/07/2020 CLINICAL DATA:  Initial evaluation for low back pain, infection suspected. EXAM: MRI LUMBAR SPINE WITHOUT AND WITH CONTRAST TECHNIQUE: Multiplanar and multiecho pulse sequences of the lumbar spine were obtained without and with intravenous contrast. CONTRAST:  7.69mL GADAVIST GADOBUTROL  1 MMOL/ML IV SOLN COMPARISON:  Prior MRI from 08/04/2019. FINDINGS: Segmentation: Standard. Same numbering system employed as on previous exam. Alignment: Straightening of the normal lumbar lordosis. Slight focal kyphotic angulation at L1-2, progressed from previous. Two-3 mm retrolisthesis of L2 on L3 and L3 on L4, similar. Chronic 3 mm anterolisthesis of L4 on L5 also unchanged. Vertebrae: Progressive findings of osteomyelitis discitis about the L1-2 interspace with progressive disc space height loss and endplate irregularity. Relatively mild residual endplate edema and enhancement, with no other significant findings for osteomyelitis discitis at this level, improved relative to previous exam. Now seen is abnormal marrow edema and enhancement about the right L3-4 facet, concerning for possible septic arthritis (series 5, image 6). Associated trace joint effusion. There is abnormal epidural phlegmon and enhancement extending from the T12-L1 level inferiorly through the  distal aspect of the thecal sac (series 20, image 10). Involvement of the epidural space is fairly circumferential in nature, and most pronounced at the levels of L3 and L4. Possible superimposed small epidural abscess at the right dorsal epidural space at the level of L4-5, measuring up to 6 mm in diameter (series 9, image 23). Resultant moderate to severe diffuse spinal stenosis extending from L1-2 inferiorly through the distal thecal sac, most pronounced at L3-4 and L4-5. No evidence for acute or interval fracture. Underlying bone marrow signal intensity within normal limits. No worrisome osseous lesions. Conus medullaris and cauda equina: Conus extends to the L1 level. Conus medullaris itself is relatively normal in appearance. Nerve roots of the cauda equina are irregular and undulating related to underlying stenosis. Paraspinal and other soft tissues: Abnormal paraspinous edema and enhancement within the right greater than left posterior paraspinous soft tissues, which could reflect changes related to acute myositis and/or postoperative changes. No soft tissue abscess or drainable fluid collection. Few small simple cyst noted within the posterior right hepatic lobe and interpolar left kidney. Visualized visceral structures otherwise unremarkable. Disc levels: T12-L1: Mild disc bulge with disc desiccation. Small amount of dorsal epidural phlegmon. Mild facet hypertrophy. No significant spinal stenosis. Foramina remain patent. L1-2: Progressive changes of osteomyelitis discitis with progressive disc space height loss and endplate irregularity and slight focal kyphotic angulation. Resultant circumferential disc osteophyte complex. Circumferential epidural phlegmon and enhancement. Mild facet hypertrophy. Resultant moderate to severe spinal stenosis. Fairly severe bilateral L1 foraminal narrowing. L2-3: Diffuse disc bulge with disc desiccation and intervertebral disc space narrowing. Superimposed right  extraforaminal disc protrusion (series 9, image 14). Circumferential epidural phlegmon. Mild facet hypertrophy. Resultant severe spinal stenosis. Foramina remain patent. L3-4: Diffuse disc bulge with disc desiccation. Moderate bilateral facet hypertrophy with findings concerning for acute septic arthritis at the right L3-4 facet. Circumferential epidural phlegmon and enhancement. Resultant severe spinal stenosis with near complete effacement of the thecal sac. Mild to moderate right worse than left L3 foraminal narrowing. L4-5: Sequelae of prior PLIF. Circumferential epidural phlegmon and enhancement with possible 6 mm epidural abscess at the dorsal epidural space (series 9, image 23). Resultant moderate to severe spinal stenosis. Nerve roots of the cauda equina demonstrate a trident morphology due to proximal stenosis and/or arachnoiditis. Foramina remain patent. L5-S1: Negative interspace. Epidural phlegmon and enhancement without significant spinal stenosis. Foramina remain patent. IMPRESSION: 1. Findings concerning for acute septic arthritis involving the right L3-4 facet. Associated circumferential epidural phlegmon and enhancement extending from T12-L1 inferiorly through the distal thecal sac. Resultant moderate to severe diffuse spinal stenosis extending from L1-2 inferiorly as above. 2. Abnormal paraspinous edema and enhancement within  the right greater than left posterior paraspinous soft tissues, nonspecific, but could reflect changes related to acute myositis and/or postoperative changes. No soft tissue abscess or drainable fluid collection. 3. Interval improvement changes related to osteomyelitis discitis about the L1-2 interspace, with only mild residual endplate edema enhancement now seen at this time. 4. Underlying multilevel degenerative spondylosis as above. Electronically Signed   By: Rise Mu M.D.   On: 01/07/2020 02:51        Scheduled Meds: . benztropine  1 mg Oral Daily  .  buPROPion  300 mg Oral Daily  . gabapentin  300 mg Oral BID  . losartan  100 mg Oral Daily  . mirtazapine  15 mg Oral QHS  . pantoprazole  40 mg Oral Daily  . sodium chloride flush  3 mL Intravenous Q12H   Continuous Infusions: . sodium chloride       LOS: 1 day    Time spent: 35 minutes.     Alba Cory, MD [facility]   If 7PM-7AM, please contact night-coverage [email or web address]  01/08/2020, 8:09 AM

## 2020-01-09 DIAGNOSIS — M4626 Osteomyelitis of vertebra, lumbar region: Secondary | ICD-10-CM | POA: Diagnosis not present

## 2020-01-09 LAB — VITAMIN B12: Vitamin B-12: 216 pg/mL (ref 180–914)

## 2020-01-09 LAB — BASIC METABOLIC PANEL
Anion gap: 11 (ref 5–15)
BUN: 11 mg/dL (ref 6–20)
CO2: 22 mmol/L (ref 22–32)
Calcium: 9.2 mg/dL (ref 8.9–10.3)
Chloride: 102 mmol/L (ref 98–111)
Creatinine, Ser: 0.75 mg/dL (ref 0.61–1.24)
GFR, Estimated: >60 mL/min (ref >60)
Glucose, Bld: 137 mg/dL — ABNORMAL HIGH (ref 70–99)
Potassium: 4 mmol/L (ref 3.5–5.1)
Sodium: 135 mmol/L (ref 135–145)

## 2020-01-09 LAB — CBC
HCT: 35.1 % — ABNORMAL LOW (ref 39.0–52.0)
Hemoglobin: 11.1 g/dL — ABNORMAL LOW (ref 13.0–17.0)
MCH: 26.2 pg (ref 26.0–34.0)
MCHC: 31.6 g/dL (ref 30.0–36.0)
MCV: 83 fL (ref 80.0–100.0)
Platelets: 392 10*3/uL (ref 150–400)
RBC: 4.23 MIL/uL (ref 4.22–5.81)
RDW: 15.6 % — ABNORMAL HIGH (ref 11.5–15.5)
WBC: 9.2 10*3/uL (ref 4.0–10.5)
nRBC: 0 % (ref 0.0–0.2)

## 2020-01-09 LAB — RETICULOCYTES
Immature Retic Fract: 7.4 % (ref 2.3–15.9)
RBC.: 4.11 MIL/uL — ABNORMAL LOW (ref 4.22–5.81)
Retic Count, Absolute: 43.6 10*3/uL (ref 19.0–186.0)
Retic Ct Pct: 1.1 % (ref 0.4–3.1)

## 2020-01-09 LAB — IRON AND TIBC
Iron: 37 ug/dL — ABNORMAL LOW (ref 45–182)
Saturation Ratios: 10 % — ABNORMAL LOW (ref 17.9–39.5)
TIBC: 381 ug/dL (ref 250–450)
UIBC: 344 ug/dL

## 2020-01-09 LAB — FOLATE: Folate: 17.6 ng/mL (ref >5.9)

## 2020-01-09 LAB — FERRITIN: Ferritin: 49 ng/mL (ref 24–336)

## 2020-01-09 MED ORDER — VITAMIN B-12 100 MCG PO TABS
100.0000 ug | ORAL_TABLET | Freq: Every day | ORAL | Status: DC
  Administered 2020-01-09 – 2020-02-20 (×43): 100 ug via ORAL
  Filled 2020-01-09 (×44): qty 1

## 2020-01-09 MED ORDER — ONDANSETRON HCL 4 MG/2ML IJ SOLN
4.0000 mg | Freq: Four times a day (QID) | INTRAMUSCULAR | Status: DC | PRN
  Filled 2020-01-09: qty 2

## 2020-01-09 MED ORDER — MORPHINE SULFATE (PF) 2 MG/ML IV SOLN
2.0000 mg | INTRAVENOUS | Status: DC | PRN
  Administered 2020-01-09 – 2020-01-14 (×15): 2 mg via INTRAVENOUS
  Filled 2020-01-09 (×17): qty 1

## 2020-01-09 MED ORDER — FERROUS SULFATE 325 (65 FE) MG PO TABS
325.0000 mg | ORAL_TABLET | Freq: Every day | ORAL | Status: DC
  Administered 2020-01-10 – 2020-02-20 (×42): 325 mg via ORAL
  Filled 2020-01-09 (×43): qty 1

## 2020-01-09 MED ORDER — ALUM & MAG HYDROXIDE-SIMETH 200-200-20 MG/5ML PO SUSP
15.0000 mL | Freq: Four times a day (QID) | ORAL | Status: DC | PRN
  Administered 2020-01-26: 15 mL via ORAL
  Filled 2020-01-09 (×2): qty 30

## 2020-01-09 MED ORDER — OXYCODONE HCL 5 MG PO TABS
5.0000 mg | ORAL_TABLET | Freq: Four times a day (QID) | ORAL | Status: DC | PRN
  Administered 2020-01-10 – 2020-01-14 (×13): 5 mg via ORAL
  Filled 2020-01-09 (×13): qty 1

## 2020-01-09 NOTE — Progress Notes (Signed)
PROGRESS NOTE    [name]  UUV:[id number] DOB: [birth year] DOA: 01/06/2020 PCP: [name], MD   Brief Narrative: 54 year old with past medical history significant for discitis (April 2021 treated with 6 weeks of vancomycin and ceftriaxone, culture unrevealing), status post laminectomy, schizophrenia and ongoing cocaine use who presents complaining of shoulder pain and back pain after a mechanical fall 4 days prior to admission.  He denies fever or chills, he has persistent numbness laterally which is not new.  He denies any changes in his lower extremity strength. Evaluation in the ED imaging no isolated rib fracture without pneumothorax, LS-spine showed possible progressive discitis osteomyelitis.  MRI was performed which shows possible septic arthritis at L3-4 , associated epidural phlegmon and enhancement extending from T12 -L1.  Resultant moderate to severe diffuse spinal stenosis extending from L1 -L2, improved discitis at L1-2.   Patient was evaluated by neurosurgery, who commended medical treatment and IR guided disc space biopsy to guide antibiotics. Underwent aspiration/ core biopsy by Dr Titus Dubin 10/11. Patient was started on IV vancomycin and ceftriaxone after bx 10/12.   Assessment & Plan:   Principal Problem:   Acute osteomyelitis of lumbar spine (HCC) Active Problems:   Cocaine abuse (HCC)   Benign essential HTN   History of CVA (cerebrovascular accident)   Chronic pain syndrome  1-Septic Arthritis L 3-4, associated epidural phlegmon: -Underwent core bone biopsy (aspiration unsuccessful due to severe disc space narrowing)  by Dr Velia Meyer. Culture pending.  -Appreciate Dr Corliss Skains  and Maurice Small help.  -Started on IV vancomycin and ceftriaxone after bone core bx.  -CRP 2.1 -ID will see patient in consultation 10/13. -oral oxycodone added. IV morphine.   2-HTN;  Continue with Losartan.   3-Ongoing cocaine, ETOH use; Quit alcohol 5 years ago.    Counseling about cocaine.   4-COPD; PRN nebulizer.   5-Schizophrenia;  On Invega. Get it every 3 months.  Continue with Cogentin, Wellbutrin, Remeron.   6-GERD; PPI.   7-Mild Hyponatremia; monitor. Na 134.  Resolved/ 8-Anemia; iron deficiency.  Start oral iron and B 12 supplement.  B 12; 216, ferritin 49, iron 37.   9-Acute non displace Fracture involving the anterolateral left ninth Rib;  Pain management. Incentive spirometry.  10-nausea, vomiting; support care. Zofran, Maloox PRN        Estimated body mass index is 27.78 kg/m as calculated from the following:   Height as of this encounter: 5\' 9"  (1.753 m).   Weight as of this encounter: 85.3 kg.   DVT prophylaxis: SCD Code Status: Full code Family Communication: care discussed with patient.  Disposition Plan:  Status is: Inpatient  Remains inpatient appropriate because:IV treatments appropriate due to intensity of illness or inability to take PO   Dispo: The patient is from: Home              Anticipated d/c is to: to be determine.               Anticipated d/c date is: 2 days              Patient currently is not medically stable to d/c.        Consultants:   IR  ID  Neurosurgery.   Procedures:   Disc aspiration Lumbar spine.   Antimicrobials:    Subjective: He is complaining of severe back pain, IV pain medication is not helping.  He report indigestion and nausea.   Objective: Vitals:   01/08/20 1731 01/08/20 2101 01/09/20 0331 01/09/20  0841  BP: 101/80 (!) 115/92 (!) 134/100 (!) 144/99  Pulse: (!) 102 (!) 104 (!) 108 (!) 108  Resp: 18 18 20 18   Temp: 98.7 F (37.1 C) 99.2 F (37.3 C) 98.6 F (37 C) 98.5 F (36.9 C)  TempSrc: Oral Oral Oral   SpO2: 96% 98% 99% 97%  Weight:      Height:        Intake/Output Summary (Last 24 hours) at 01/09/2020 1103 Last data filed at 01/09/2020 1055 Gross per 24 hour  Intake 1054.59 ml  Output 304 ml  Net 750.59 ml   Filed Weights    01/06/20 2035 01/07/20 1400 01/07/20 2123  Weight: 86.2 kg 83.6 kg 85.3 kg    Examination:  General exam: NAD Respiratory system: CTA Cardiovascular system: S 1, S 2 RRR. Gastrointestinal system: BS present, soft, nt Central nervous system: alert, oriented  Extremities: symmetric power.     Data Reviewed: I have personally reviewed following labs and imaging studies  CBC: Recent Labs  Lab 01/07/20 1242 01/08/20 0150 01/09/20 0316  WBC 7.6 8.1 9.2  NEUTROABS 5.8  --   --   HGB 10.9* 10.7* 11.1*  HCT 35.1* 33.4* 35.1*  MCV 85.2 83.1 83.0  PLT 390 386 392   Basic Metabolic Panel: Recent Labs  Lab 01/07/20 1242 01/08/20 0150 01/09/20 0316  NA 134* 134* 135  K 4.2 4.1 4.0  CL 101 101 102  CO2 23 24 22   GLUCOSE 156* 126* 137*  BUN 7 9 11   CREATININE 0.92 0.73 0.75  CALCIUM 8.9 9.0 9.2   GFR: Estimated Creatinine Clearance: 114.2 mL/min (by C-G formula based on SCr of 0.75 mg/dL). Liver Function Tests: Recent Labs  Lab 01/07/20 1242  AST 29  ALT 8  ALKPHOS 98  BILITOT 0.8  PROT 6.3*  ALBUMIN 3.3*   No results for input(s): LIPASE, AMYLASE in the last 168 hours. No results for input(s): AMMONIA in the last 168 hours. Coagulation Profile: No results for input(s): INR, PROTIME in the last 168 hours. Cardiac Enzymes: No results for input(s): CKTOTAL, CKMB, CKMBINDEX, TROPONINI in the last 168 hours. BNP (last 3 results) No results for input(s): PROBNP in the last 8760 hours. HbA1C: No results for input(s): HGBA1C in the last 72 hours. CBG: No results for input(s): GLUCAP in the last 168 hours. Lipid Profile: No results for input(s): CHOL, HDL, LDLCALC, TRIG, CHOLHDL, LDLDIRECT in the last 72 hours. Thyroid Function Tests: No results for input(s): TSH, T4TOTAL, FREET4, T3FREE, THYROIDAB in the last 72 hours. Anemia Panel: Recent Labs    01/09/20 0316  VITAMINB12 216  FOLATE 17.6  FERRITIN 49  TIBC 381  IRON 37*  RETICCTPCT 1.1   Sepsis  Labs: No results for input(s): PROCALCITON, LATICACIDVEN in the last 168 hours.  Recent Results (from the past 240 hour(s))  Respiratory Panel by RT PCR (Flu A&B, Covid) - Nasopharyngeal Swab     Status: None   Collection Time: 01/07/20  3:09 AM   Specimen: Nasopharyngeal Swab  Result Value Ref Range Status   SARS Coronavirus 2 by RT PCR NEGATIVE NEGATIVE Final    Comment: (NOTE) SARS-CoV-2 target nucleic acids are NOT DETECTED.  The SARS-CoV-2 RNA is generally detectable in upper respiratoy specimens during the acute phase of infection. The lowest concentration of SARS-CoV-2 viral copies this assay can detect is 131 copies/mL. A negative result does not preclude SARS-Cov-2 infection and should not be used as the sole basis for treatment or other patient  management decisions. A negative result may occur with  improper specimen collection/handling, submission of specimen other than nasopharyngeal swab, presence of viral mutation(s) within the areas targeted by this assay, and inadequate number of viral copies (<131 copies/mL). A negative result must be combined with clinical observations, patient history, and epidemiological information. The expected result is Negative.  Fact Sheet for Patients:  https://www.moore.com/  Fact Sheet for Healthcare Providers:  https://www.young.biz/  This test is no t yet approved or cleared by the [place] FDA and  has been authorized for detection and/or diagnosis of SARS-CoV-2 by FDA under an Emergency Use Authorization (EUA). This EUA will remain  in effect (meaning this test can be used) for the duration of the COVID-19 declaration under Section 564(b)(1) of the Act, 21 U.S.C. section 360bbb-3(b)(1), unless the authorization is terminated or revoked sooner.     Influenza A by PCR NEGATIVE NEGATIVE Final   Influenza B by PCR NEGATIVE NEGATIVE Final    Comment: (NOTE) The Xpert Xpress  SARS-CoV-2/FLU/RSV assay is intended as an aid in  the diagnosis of influenza from Nasopharyngeal swab specimens and  should not be used as a sole basis for treatment. Nasal washings and  aspirates are unacceptable for Xpert Xpress SARS-CoV-2/FLU/RSV  testing.  Fact Sheet for Patients: https://www.moore.com/  Fact Sheet for Healthcare Providers: https://www.young.biz/  This test is not yet approved or cleared by the [place] FDA and  has been authorized for detection and/or diagnosis of SARS-CoV-2 by  FDA under an Emergency Use Authorization (EUA). This EUA will remain  in effect (meaning this test can be used) for the duration of the  Covid-19 declaration under Section 564(b)(1) of the Act, 21  U.S.C. section 360bbb-3(b)(1), unless the authorization is  terminated or revoked. Performed at [facility], [address] [place], [place] [address]   Culture, blood (routine x 2)     Status: None (Preliminary result)   Collection Time: 01/08/20  9:10 AM   Specimen: BLOOD  Result Value Ref Range Status   Specimen Description BLOOD RIGHT ANTECUBITAL  Final   Special Requests   Final    BOTTLES DRAWN AEROBIC AND ANAEROBIC Blood Culture adequate volume   Culture   Final    NO GROWTH < 24 HOURS Performed at [facility], [address] [place], [place] [address]    Report Status PENDING  Incomplete  Culture, blood (routine x 2)     Status: None (Preliminary result)   Collection Time: 01/08/20  9:13 AM   Specimen: BLOOD  Result Value Ref Range Status   Specimen Description BLOOD LEFT ANTECUBITAL  Final   Special Requests   Final    BOTTLES DRAWN AEROBIC AND ANAEROBIC Blood Culture adequate volume   Culture   Final    NO GROWTH < 24 HOURS Performed at [facility], [address], [place], [place] [address]    Report Status PENDING  Incomplete  Body fluid culture     Status: None (Preliminary result)   Collection Time:  01/08/20  3:01 PM   Specimen: Body Fluid  Result Value Ref Range Status   Specimen Description FLUID  Final   Special Requests DISC L1  Final   Gram Stain   Final    ABUNDANT WBC PRESENT,BOTH PMN AND MONONUCLEAR NO ORGANISMS SEEN    Culture   Final    NO GROWTH < 24 HOURS Performed at [facility], [address], [place], [place] [address]  Report Status PENDING  Incomplete  Anaerobic culture     Status: None (Preliminary result)   Collection Time: 01/08/20  3:01 PM   Specimen: Fluid  Result Value Ref Range Status   Specimen Description FLUID  Final   Special Requests   Final    DISC L1 Performed at [facility], [address], [place], [place] [address]    Gram Stain PENDING  Incomplete   Culture PENDING  Incomplete   Report Status PENDING  Incomplete         Radiology Studies: No results found.      Scheduled Meds:  benztropine  1 mg Oral Daily   buPROPion  300 mg Oral Daily   gabapentin  300 mg Oral BID   losartan  100 mg Oral Daily   mirtazapine  15 mg Oral QHS   pantoprazole  40 mg Oral Daily   sodium chloride flush  3 mL Intravenous Q12H   Continuous Infusions:  sodium chloride     cefTRIAXone (ROCEPHIN)  IV 2 g (01/08/20 1836)   vancomycin 1,500 mg (01/09/20 0315)     LOS: 2 days    Time spent: 35 minutes.     Alba Cory, MD [facility]   If 7PM-7AM, please contact night-coverage [email or web address]  01/09/2020, 11:03 AM

## 2020-01-09 NOTE — Evaluation (Addendum)
Physical Therapy Evaluation Patient Details Name: [name] MRN: [id number] DOB: [birth year] Today's Date: 01/09/2020   History of Present Illness  [name] is an 54 y.o. male with PMH significant for discitis April 2021treated with 6 weeks vancomycin and ceftriaxone with no growth on cultures, status post laminectomy, schizophrenia and ongoing cocaine use was in his [facility] till 4 days ago when he had a mechanical fall in the shower.  Since that fall patient has had shoulder pain and back pain which he thought would improve on its own.  However his back pain continued so he came to [facility] ED for evaluation.  Clinical Impression  Patient received in bed, he reports some back pain, but performs bed mobility independently. He reports he has been getting up to bathroom. Patient transfers without assistance. Ambulated in halls with RW and min guard for safety. He moves quickly and requires cues to slow down for safety. (He is used to using rollator so may do better with that as it is able to keep up with him better.) He reports multiple falls at home, states his balance is not good, his strength appears to be WNL. He will continue to benefit from skilled PT while here to improve safety with mobility.        Follow Up Recommendations No PT follow up    Equipment Recommendations  None recommended by PT    Recommendations for Other Services       Precautions / Restrictions Precautions Precautions: Fall Restrictions Weight Bearing Restrictions: No      Mobility  Bed Mobility Overal bed mobility: Independent                Transfers Overall transfer level: Independent               General transfer comment: ambulated into bathroom and back out independently  Ambulation/Gait Ambulation/Gait assistance: Min guard Gait Distance (Feet): 400 Feet Assistive device: Rolling walker (2 wheeled) Gait Pattern/deviations: Step-through pattern;Shuffle;Decreased stride  length Gait velocity: WFL, cues needed to slow down for safety   General Gait Details: slightly unsteady with ambulation. Benefits from min guard  Stairs            Wheelchair Mobility    Modified Rankin (Stroke Patients Only)       Balance Overall balance assessment: Needs assistance Sitting-balance support: Feet supported Sitting balance-[name]: Good     Standing balance support: Bilateral upper extremity supported;During functional activity Standing balance-[name]: Fair Standing balance comment: reliant on RW and min guard                             Pertinent Vitals/Pain Pain Assessment: Faces Faces Pain Scale: Hurts little more Pain Location: back Pain Descriptors / Indicators: Discomfort Pain Intervention(s): Monitored during session;Limited activity within patient's tolerance    Home Living Family/patient expects to be discharged to:: Private residence Living Arrangements: Spouse/significant other Available Help at Discharge: Friend(s);Available 24 hours/day Type of Home: Apartment Home Access: Level entry     Home Layout: One level Home Equipment: Walker - 4 wheels Additional Comments: States he has rollator at home that he uses regularly    Prior Function Level of Independence: Independent with assistive device(s)         Comments: Uses rollator at baseline. Reports independence with ADLs.     Hand Dominance        Extremity/Trunk Assessment   Upper Extremity  Assessment Upper Extremity Assessment: Overall WFL for tasks assessed    Lower Extremity Assessment Lower Extremity Assessment: Overall WFL for tasks assessed    Cervical / Trunk Assessment Cervical / Trunk Assessment: Normal  Communication   Communication: No difficulties  Cognition Arousal/Alertness: Awake/alert Behavior During Therapy: WFL for tasks assessed/performed Overall Cognitive Status: Within Functional Limits for tasks assessed                                         General Comments      Exercises     Assessment/Plan    PT Assessment Patient needs continued PT services  PT Problem List Decreased mobility;Decreased activity tolerance;Decreased balance;Decreased safety awareness;Decreased knowledge of precautions       PT Treatment Interventions DME instruction;Therapeutic activities;Gait training;Therapeutic exercise;Patient/family education;Balance training;Functional mobility training    PT Goals (Current goals can be found in the Care Plan section)  Acute Rehab PT Goals Patient Stated Goal: to return home PT Goal Formulation: With patient Time For Goal Achievement: 01/23/20 Potential to Achieve Goals: Good    Frequency Min 3X/week   Barriers to discharge        Co-evaluation               AM-PAC PT "6 Clicks" Mobility  Outcome Measure Help needed turning from your back to your side while in a flat bed without using bedrails?: None Help needed moving from lying on your back to sitting on the side of a flat bed without using bedrails?: None Help needed moving to and from a bed to a chair (including a wheelchair)?: None Help needed standing up from a chair using your arms (e.g., wheelchair or bedside chair)?: None Help needed to walk in hospital room?: A Little Help needed climbing 3-5 steps with a railing? : A Little 6 Click Score: 22    End of Session Equipment Utilized During Treatment: Gait belt Activity Tolerance: Patient tolerated treatment well Patient left: in bed;with call bell/phone within reach Nurse Communication: Mobility status PT Visit Diagnosis: Unsteadiness on feet (R26.81)    Time: 1440-1459 PT Time Calculation (min) (ACUTE ONLY): 19 min   Charges:   PT Evaluation $PT Eval Moderate Complexity: 1 Mod PT Treatments $Gait Training: 8-22 mins        Keeya Dyckman, PT, GCS 01/09/20,3:10 PM

## 2020-01-10 ENCOUNTER — Inpatient Hospital Stay (HOSPITAL_COMMUNITY): Payer: Medicare Other

## 2020-01-10 DIAGNOSIS — M4646 Discitis, unspecified, lumbar region: Secondary | ICD-10-CM

## 2020-01-10 DIAGNOSIS — M4626 Osteomyelitis of vertebra, lumbar region: Secondary | ICD-10-CM | POA: Diagnosis not present

## 2020-01-10 DIAGNOSIS — G894 Chronic pain syndrome: Secondary | ICD-10-CM

## 2020-01-10 DIAGNOSIS — F1410 Cocaine abuse, uncomplicated: Secondary | ICD-10-CM | POA: Diagnosis not present

## 2020-01-10 DIAGNOSIS — M86372 Chronic multifocal osteomyelitis, left ankle and foot: Secondary | ICD-10-CM

## 2020-01-10 DIAGNOSIS — S2232XA Fracture of one rib, left side, initial encounter for closed fracture: Secondary | ICD-10-CM

## 2020-01-10 LAB — CBC
HCT: 33.6 % — ABNORMAL LOW (ref 39.0–52.0)
Hemoglobin: 10.8 g/dL — ABNORMAL LOW (ref 13.0–17.0)
MCH: 26.8 pg (ref 26.0–34.0)
MCHC: 32.1 g/dL (ref 30.0–36.0)
MCV: 83.4 fL (ref 80.0–100.0)
Platelets: 397 10*3/uL (ref 150–400)
RBC: 4.03 MIL/uL — ABNORMAL LOW (ref 4.22–5.81)
RDW: 15.3 % (ref 11.5–15.5)
WBC: 7.5 10*3/uL (ref 4.0–10.5)
nRBC: 0 % (ref 0.0–0.2)

## 2020-01-10 LAB — BASIC METABOLIC PANEL
Anion gap: 9 (ref 5–15)
BUN: 8 mg/dL (ref 6–20)
CO2: 24 mmol/L (ref 22–32)
Calcium: 9 mg/dL (ref 8.9–10.3)
Chloride: 102 mmol/L (ref 98–111)
Creatinine, Ser: 0.79 mg/dL (ref 0.61–1.24)
GFR, Estimated: >60 mL/min (ref >60)
Glucose, Bld: 154 mg/dL — ABNORMAL HIGH (ref 70–99)
Potassium: 3.7 mmol/L (ref 3.5–5.1)
Sodium: 135 mmol/L (ref 135–145)

## 2020-01-10 LAB — SURGICAL PATHOLOGY

## 2020-01-10 MED ORDER — GADOBUTROL 1 MMOL/ML IV SOLN
8.5000 mL | Freq: Once | INTRAVENOUS | Status: AC | PRN
  Administered 2020-01-10: 8.5 mL via INTRAVENOUS

## 2020-01-10 MED ORDER — SODIUM CHLORIDE 0.9% FLUSH
10.0000 mL | INTRAVENOUS | Status: DC | PRN
  Administered 2020-01-17 – 2020-01-19 (×2): 10 mL

## 2020-01-10 MED ORDER — SODIUM CHLORIDE 0.9% FLUSH
10.0000 mL | Freq: Two times a day (BID) | INTRAVENOUS | Status: DC
  Administered 2020-01-10 – 2020-01-19 (×6): 10 mL

## 2020-01-10 MED ORDER — CYCLOBENZAPRINE HCL 5 MG PO TABS
5.0000 mg | ORAL_TABLET | Freq: Two times a day (BID) | ORAL | Status: DC
  Administered 2020-01-10: 5 mg via ORAL
  Filled 2020-01-10: qty 1

## 2020-01-10 NOTE — Consult Note (Signed)
[facility]    Date of Admission:  01/06/2020      Total days of antibiotics 3   Vancomycin + Ceftriaxone               Reason for Consult: Vertebral infection     Referring Provider: Dr. [name]  Primary Care Provider: Gladys Damme, MD   Assessment: [name] is a 54 y.o. male with recurrent and advancing vertebral infection now involving T12-L1, L1-L2 and L3-L4 with paraspinous edema/myositis w/o abscess but circumferential epidural phlegmon T12-L1. At this time neurologic exam is at patient's baseline with equal bilateral strength and decreased sensation which is chronic for him. No bowel/bladder changes. So far cultures are pending and on empiric regimen of Vancomycin and Ceftriaxone. CRP recently elevated at 2.1.   Will MRI his ankle to ensure no concern for residual chronic infection here.   He does report he snorts heroin, adderall and crack - pain control for him as he has had a very difficult time getting to pain clinic. Will check Hep C Ab     Plan: 1. Follow micro data  2. Continue vancomycin + ceftriaxone 3. MRI L ankle to ensure no smoldering infection that could be seeding back.      Principal Problem:   Acute osteomyelitis of lumbar spine (HCC) Active Problems:   Cocaine abuse (HCC)   Benign essential HTN   History of CVA (cerebrovascular accident)   Chronic pain syndrome    benztropine  1 mg Oral Daily   buPROPion  300 mg Oral Daily   ferrous sulfate  325 mg Oral Q breakfast   gabapentin  300 mg Oral BID   losartan  100 mg Oral Daily   mirtazapine  15 mg Oral QHS   pantoprazole  40 mg Oral Daily   sodium chloride flush  10-40 mL Intracatheter Q12H   sodium chloride flush  3 mL Intravenous Q12H   vitamin B-12  100 mcg Oral Daily    HPI: [name] is a 54 y.o. male admitted from home with worsening acute back pain.   He states that he was hospitalized for vertebral infection a few months  back. This was previously at L1-2. He was treated with a course of IV vancomycin and ceftriaxone for 6 weeks inpatient (polysubstance drug use hx including cocaine and heroin --> he snorts both and denies injection). He stated that his pain decreased substantially, although still with some shooting radiculopathy-like pains down legs and feet. More recently he has had falls in the home as he is insensate and has trouble determining when feet are on the floor. Recalled recently prior to current admission where he "kind of fainted/went out" standing in the kitchen and hurt his left elbow, back and fractured his left rib. Thereafter he noticed his back was starting to hurt more. He did not have any fevers or chills to his knowledge. He lives with his fiance, [name].   In the past he was seen by ID in 2019 for left ankle infection a/w hardware. This required complete hardware removal and was difficult to heal - enterobacter cloacea was found and he was on extended course of PO Bactrim. He still gets pain frequently here and swelling intermittently. No drainage from the site or warmth/redness.    Hospital course -  Seen in ER tachycardic, normotensive and non-hypoxic. Afebrile. MRI revealed extensive and new areas of infection with circumferential phlegmon extending above  and below previous site L1-2. He went for attempted disc aspiration but was unsuccessful so vertebral core biopsy was performed in lieu of. So far nothing on stain/culture.    Review of Systems: Review of Systems  Constitutional: Negative for chills and fever.  HENT: Negative for tinnitus.   Eyes: Negative for blurred vision and photophobia.  Respiratory: Negative for cough and sputum production.   Cardiovascular: Negative for chest pain.  Gastrointestinal: Negative for diarrhea, nausea and vomiting.  Genitourinary: Negative for dysuria.  Musculoskeletal: Positive for back pain and joint pain.  Skin: Negative for rash.  Neurological:  Positive for tingling. Negative for headaches.  Psychiatric/Behavioral: Positive for substance abuse.    Past Medical History:  Diagnosis Date   Anxiety    Arthritis    Asthma    Depression    Duodenal adenoma    GERD (gastroesophageal reflux disease)    Hypertension    Pneumonia    hx   Post-traumatic osteoarthritis of left ankle    Pre-diabetes    Presence of right artificial knee joint 12/17/2016   Schizophrenia ([place])    Stroke ([place])    2009-or10    Social History   Tobacco Use   Smoking status: Heavy Tobacco Smoker    Packs/day: 1.50    Years: 38.00    Pack years: 57.00    Types: Cigarettes   Smokeless tobacco: Never Used   Tobacco comment: onset age 27; upto 1.5 ppd  Vaping Use   Vaping Use: Never used  Substance Use Topics   Alcohol use: Not Currently    Alcohol/week: 3.0 standard drinks    Types: 3 Cans of beer per week    Comment: quit 2015; formerly up to 2 fifths/day   Drug use: No    Family History  Adopted: Yes  Problem Relation Age of Onset   Colon cancer Neg Hx    Esophageal cancer Neg Hx    Rectal cancer Neg Hx    Stomach cancer Neg Hx    Allergies  Allergen Reactions   Chlorhexidine Itching and Rash    OBJECTIVE: Blood pressure 110/77, pulse (!) 101, temperature 97.8 F (36.6 C), temperature source Oral, resp. rate 18, height 5\' 9"  (1.753 m), weight 85.3 kg, SpO2 98 %.  Physical Exam Vitals reviewed.  Constitutional:      Appearance: Normal appearance.     Comments: Sitting upright in bed. [name] to sit up and maneuver around pretty well unassisted.   HENT:     Mouth/Throat:     Mouth: Mucous membranes are moist.     Pharynx: Oropharynx is clear.     Comments: Poor dentition  Eyes:     General: No scleral icterus. Cardiovascular:     Rate and Rhythm: Normal rate and regular rhythm.     Heart sounds: No murmur heard.   Pulmonary:     Effort: Respiratory distress present.     Breath sounds: Normal breath  sounds.  Abdominal:     General: Bowel sounds are normal. There is no distension.     Palpations: Abdomen is soft.     Tenderness: There is no abdominal tenderness.  Musculoskeletal:     Comments: Left ankle deformity from previous surgery/fusion.  No erythema, edema or warmth present on exam today. Well healed surgical incisions.   Skin:    General: Skin is warm and dry.     Capillary Refill: Capillary refill takes less than 2 seconds.  Neurological:     Mental Status:  He is alert and oriented to person, place, and time.  Psychiatric:        Mood and Affect: Mood normal.        Behavior: Behavior normal.     Lab Results Lab Results  Component Value Date   WBC 7.5 01/10/2020   HGB 10.8 (L) 01/10/2020   HCT 33.6 (L) 01/10/2020   MCV 83.4 01/10/2020   PLT 397 01/10/2020    Lab Results  Component Value Date   CREATININE 0.79 01/10/2020   BUN 8 01/10/2020   NA 135 01/10/2020   K 3.7 01/10/2020   CL 102 01/10/2020   CO2 24 01/10/2020    Lab Results  Component Value Date   ALT 8 01/07/2020   AST 29 01/07/2020   ALKPHOS 98 01/07/2020   BILITOT 0.8 01/07/2020     Microbiology: Recent Results (from the past 240 hour(s))  Respiratory Panel by RT PCR (Flu A&B, Covid) - Nasopharyngeal Swab     Status: None   Collection Time: 01/07/20  3:09 AM   Specimen: Nasopharyngeal Swab  Result Value Ref Range Status   SARS Coronavirus 2 by RT PCR NEGATIVE NEGATIVE Final    Comment: (NOTE) SARS-CoV-2 target nucleic acids are NOT DETECTED.  The SARS-CoV-2 RNA is generally detectable in upper respiratoy specimens during the acute phase of infection. The lowest concentration of SARS-CoV-2 viral copies this assay can detect is 131 copies/mL. A negative result does not preclude SARS-Cov-2 infection and should not be used as the sole basis for treatment or other patient management decisions. A negative result may occur with  improper specimen collection/handling, submission of specimen  other than nasopharyngeal swab, presence of viral mutation(s) within the areas targeted by this assay, and inadequate number of viral copies (<131 copies/mL). A negative result must be combined with clinical observations, patient history, and epidemiological information. The expected result is Negative.  Fact Sheet for Patients:  PinkCheek.be  Fact Sheet for Healthcare Providers:  GravelBags.it  This test is no t yet approved or cleared by the [place] FDA and  has been authorized for detection and/or diagnosis of SARS-CoV-2 by FDA under an Emergency Use Authorization (EUA). This EUA will remain  in effect (meaning this test can be used) for the duration of the COVID-19 declaration under Section 564(b)(1) of the Act, 21 U.S.C. section 360bbb-3(b)(1), unless the authorization is terminated or revoked sooner.     Influenza A by PCR NEGATIVE NEGATIVE Final   Influenza B by PCR NEGATIVE NEGATIVE Final    Comment: (NOTE) The Xpert Xpress SARS-CoV-2/FLU/RSV assay is intended as an aid in  the diagnosis of influenza from Nasopharyngeal swab specimens and  should not be used as a sole basis for treatment. Nasal washings and  aspirates are unacceptable for Xpert Xpress SARS-CoV-2/FLU/RSV  testing.  Fact Sheet for Patients: PinkCheek.be  Fact Sheet for Healthcare Providers: GravelBags.it  This test is not yet approved or cleared by the [place] FDA and  has been authorized for detection and/or diagnosis of SARS-CoV-2 by  FDA under an Emergency Use Authorization (EUA). This EUA will remain  in effect (meaning this test can be used) for the duration of the  Covid-19 declaration under Section 564(b)(1) of the Act, 21  U.S.C. section 360bbb-3(b)(1), unless the authorization is  terminated or revoked. Performed at [facility], [place] [address]  [place], [place] [address]   Culture, blood (routine x 2)     Status: None (Preliminary result)   Collection  Time: 01/08/20  9:10 AM   Specimen: BLOOD  Result Value Ref Range Status   Specimen Description BLOOD RIGHT ANTECUBITAL  Final   Special Requests   Final    BOTTLES DRAWN AEROBIC AND ANAEROBIC Blood Culture adequate volume   Culture   Final    NO GROWTH 2 DAYS Performed at [facility], [address], [place], [place] [address]    Report Status PENDING  Incomplete  Culture, blood (routine x 2)     Status: None (Preliminary result)   Collection Time: 01/08/20  9:13 AM   Specimen: BLOOD  Result Value Ref Range Status   Specimen Description BLOOD LEFT ANTECUBITAL  Final   Special Requests   Final    BOTTLES DRAWN AEROBIC AND ANAEROBIC Blood Culture adequate volume   Culture   Final    NO GROWTH 2 DAYS Performed at [facility], [place] [address] [place], [place] [address]    Report Status PENDING  Incomplete  Body fluid culture     Status: None (Preliminary result)   Collection Time: 01/08/20  3:01 PM   Specimen: Body Fluid  Result Value Ref Range Status   Specimen Description FLUID  Final   Special Requests DISC L1  Final   Gram Stain   Final    ABUNDANT WBC PRESENT,BOTH PMN AND MONONUCLEAR NO ORGANISMS SEEN    Culture   Final    NO GROWTH 2 DAYS Performed at [facility], [address] [place], [place] [address]    Report Status PENDING  Incomplete  Anaerobic culture     Status: None (Preliminary result)   Collection Time: 01/08/20  3:01 PM   Specimen: Fluid  Result Value Ref Range Status   Specimen Description FLUID  Final   Special Requests   Final    DISC L1 Performed at [facility] [address] [place], [place] [address]    Gram Stain PENDING  Incomplete   Culture PENDING  Incomplete   Report Status PENDING  Incomplete     Janene Madeira, MSN, NP-C [facility] [email or web address] Pager: [phone] Office: [place]: [phone]

## 2020-01-10 NOTE — Evaluation (Signed)
Occupational Therapy Evaluation Patient Details Name: [name] MRN: [id number] DOB: 01-27-1966 Today's Date: 01/10/2020    History of Present Illness Pt is a 54 y/o male with PMH of discitis April 2021, s/p laminectormy, schizophrenia, cocaine use, presenting with mechanical fall in the shower with shoulder and back pain. Imaging reveals: progresive discitits osteomyelitis of LS spine, Septic arthriits at L3-4, spinal stenosis L1-2 and improved discitis at L1-2.  Underwent apsiration/core biopsy 10/11, neurosurgery recommends medical tx. Acute non displaced Fx of anterolateral L 9th rib; R shoulder with no acute findings.    Clinical Impression   PTA patient independent using rollator for mobility, ADLs, IADLs, not driving. Admitted for above and limited by problem list below, including back and R shoulder pain, decreased activity tolerance and impaired balance.  He has decreased safety awareness, cueing for pacing and RW mgmt throughout session.  Patient currently requires min guard for transfers and in room mobility using RW, min guard for LB ADLs and grooming at sink.  Discussed fall prevention and safety, as patient reports 2 falls in bathroom PTA.  He will benefit from further OT services while admitted to optimize independence and safety, but anticipate he will progress well with no further needs required after dc home.     Follow Up Recommendations  No OT follow up;Supervision - Intermittent    Equipment Recommendations  3 in 1 bedside commode    Recommendations for Other Services       Precautions / Restrictions Precautions Precautions: Fall Restrictions Weight Bearing Restrictions: No      Mobility Bed Mobility Overal bed mobility: Independent             General bed mobility comments: no assist required, educated on log rolling technique for back pain   Transfers Overall transfer level: Needs assistance Equipment used: Rolling walker (2 wheeled) Transfers:  Sit to/from Stand Sit to Stand: Min guard         General transfer comment: for safety/balance    Balance Overall balance assessment: Needs assistance Sitting-balance support: Feet supported Sitting balance-[name]: Good     Standing balance support: Bilateral upper extremity supported;During functional activity;No upper extremity supported Standing balance-[name]: Fair Standing balance comment: relies on RW dynamically, min guard statically                           ADL either performed or assessed with clinical judgement   ADL Overall ADL's : Needs assistance/impaired     Grooming: Min guard;Standing;Wash/dry hands;Wash/dry face;Oral care;Brushing hair   Upper Body Bathing: Supervision/ safety;Sitting   Lower Body Bathing: Min guard;Sit to/from stand   Upper Body Dressing : Supervision/safety;Sitting   Lower Body Dressing: Min guard;Sit to/from stand;Bed level Lower Body Dressing Details (indicate cue type and reason): donning socks bed level, min guard sit to stand  Toilet Transfer: Min guard;Ambulation;Regular Toilet;RW;Grab bars           Functional mobility during ADLs: Min guard;Rolling walker;Cueing for safety General ADL Comments: pt limited by impaired balance, pain and decreased safety; requires cueing for pacing and RW mgmt      Vision         Perception     Praxis      Pertinent Vitals/Pain Pain Assessment: Faces Faces Pain Scale: Hurts little more Pain Location: back, R shoulder with movement  Pain Descriptors / Indicators: Discomfort Pain Intervention(s): Limited activity within patient's tolerance;Monitored during session;Repositioned     Hand Dominance  Right   Extremity/Trunk Assessment Upper Extremity Assessment Upper Extremity Assessment: RUE deficits/detail;LUE deficits/detail;Generalized weakness RUE Deficits / Details: initally limited to 30* shoulder flexion, PROM WFL; able to use functionally and increase ROM  when combing hair  LUE Deficits / Details: initally limited to 90 flexion at shoulder due to pain in L ribs, PROM WFL and progressed to [facility] with combing hair    Lower Extremity Assessment Lower Extremity Assessment: Defer to PT evaluation   Cervical / Trunk Assessment Cervical / Trunk Assessment: Normal   Communication Communication Communication: No difficulties   Cognition Arousal/Alertness: Awake/alert Behavior During Therapy: WFL for tasks assessed/performed Overall Cognitive Status: History of cognitive impairments - at baseline                                 General Comments: appears WFL for functional tasks, decreased safety awareness noted; oriented to month/year but not date (reports 6th on the 13th)   General Comments       Exercises     Shoulder Instructions      Home Living Family/patient expects to be discharged to:: Private residence Living Arrangements: Spouse/significant other Available Help at Discharge: Friend(s);Available 24 hours/day Type of Home: Apartment Home Access: Level entry     Home Layout: One level     Bathroom Shower/Tub: Chief Strategy Officer: Standard Bathroom Accessibility: Yes   Home Equipment: Environmental consultant - 4 wheels   Additional Comments: states his shower chair is broken      Prior Functioning/Environment Level of Independence: Independent with assistive device(s)        Comments: uses rollator, independent ADLs/IADLs, not driving         OT Problem List: Decreased strength;Decreased activity tolerance;Impaired balance (sitting and/or standing);Pain;Obesity;Decreased knowledge of use of DME or AE;Decreased knowledge of precautions;Decreased safety awareness      OT Treatment/Interventions: Self-care/ADL training;DME and/or AE instruction;Therapeutic activities;Cognitive remediation/compensation;Patient/family education;Balance training;Therapeutic exercise    OT Goals(Current goals can be found in  the care plan section) Acute Rehab OT Goals Patient Stated Goal: to return home OT Goal Formulation: With patient Time For Goal Achievement: 01/24/20 Potential to Achieve Goals: Good  OT Frequency: Min 2X/week   Barriers to D/C:            Co-evaluation              AM-PAC OT "6 Clicks" Daily Activity     Outcome Measure Help from another person eating meals?: None Help from another person taking care of personal grooming?: A Little Help from another person toileting, which includes using toliet, bedpan, or urinal?: A Little Help from another person bathing (including washing, rinsing, drying)?: A Little Help from another person to put on and taking off regular upper body clothing?: A Little Help from another person to put on and taking off regular lower body clothing?: A Little 6 Click Score: 19   End of Session Equipment Utilized During Treatment: Gait belt;Rolling walker Nurse Communication: Mobility status  Activity Tolerance: Patient tolerated treatment well Patient left: in chair;with call bell/phone within reach;with nursing/sitter in room;Other (comment) (at sink with NT)  OT Visit Diagnosis: Other abnormalities of gait and mobility (R26.89);Pain Pain - Right/Left: Right Pain - part of body: Shoulder (back)                Time: 5784-6962 OT Time Calculation (min): 22 min Charges:  OT General Charges $OT Visit: 1 Visit  OT Evaluation $OT Eval Moderate Complexity: 1 Mod  Barry Brunner, OT Acute Rehabilitation Services Pager [phone] Office [phone]   Chancy Milroy 01/10/2020, 11:03 AM

## 2020-01-10 NOTE — Progress Notes (Signed)
PROGRESS NOTE    [name]  AVW:[id number] DOB: [birth year] DOA: 01/06/2020 PCP: [name], MD   Brief Narrative: 54 year old with past medical history significant for discitis (April 2021 treated with 6 weeks of vancomycin and ceftriaxone, culture unrevealing), status post laminectomy, schizophrenia and ongoing cocaine use who presents complaining of shoulder pain and back pain after a mechanical fall 4 days prior to admission.  He denies fever or chills, he has persistent numbness laterally which is not new.  He denies any changes in his lower extremity strength. Evaluation in the ED imaging no isolated rib fracture without pneumothorax, LS-spine showed possible progressive discitis osteomyelitis.  MRI was performed which shows possible septic arthritis at L3-4 , associated epidural phlegmon and enhancement extending from T12 -L1.  Resultant moderate to severe diffuse spinal stenosis extending from L1 -L2.improved discitis at L1-2.   Patient was evaluated by neurosurgery, who commended medical treatment and IR guided disc space biopsy to guide antibiotics.   Assessment & Plan:   Recurrent discitis/osteomyelitis of L3-4, associated epidural phlegmon: -Previously with negative cultures, completed long course of IV ceftriaxone and vancomycin -IR attempted disc aspiration and biopsy, unsuccessful due to severe disc space narrowing and overhanging degenerative osteophytes -Blood cultures are negative -Appreciate infectious disease input -Remains on IV ceftriaxone and vancomycin day 3 -ID recommended MRI of left foot/ankle -Pain control, add Flexeril, physical therapy  HTN;  Continue Losartan.   History of ongoing cocaine, prior ETOH use; Quit alcohol 5 years ago.  -Reports using cocaine 1-2 times a week and heroin occasionally -Counseled  COPD; PRN nebulizer.   Schizophrenia;  On Invega Q3 months.  Continue with Cogentin, Wellbutrin, Remeron.   GERD; continue PPI.     Mild Hyponatremia; monitor. Na 134.   Anemia of chronic disease -Monitor  Acute non displace Fracture involving the anterolateral left ninth Rib;  Pain management. Incentive spirometry.   Estimated body mass index is 27.78 kg/m as calculated from the following:   Height as of this encounter: 5\' 9"  (1.753 m).   Weight as of this encounter: 85.3 kg.   DVT prophylaxis: SCDs Code Status: Full code Family Communication: Discussed with patient and girlfriend via telephone Disposition Plan:  Status is: Inpatient  Remains inpatient appropriate because:IV treatments appropriate due to intensity of illness or inability to take PO   Dispo: The patient is from: Home              Anticipated d/c is to: to be determined              Anticipated d/c date is: Unknown              Patient currently is not medically stable to d/c.  Consultants:   IR  ID  Neurosurgery.   Procedures:   Disc aspiration Lumbar spine.   Antimicrobials:    Subjective: -Complains of low back pain/discomfort  Objective: Vitals:   01/09/20 1755 01/09/20 2156 01/10/20 0551 01/10/20 0916  BP: (!) 123/94 (!) 135/102 (!) 135/94 110/77  Pulse: (!) 104 (!) 103 (!) 103 (!) 101  Resp: 17 18 17 18   Temp: 98.3 F (36.8 C) 98.1 F (36.7 C) 97.9 F (36.6 C) 97.8 F (36.6 C)  TempSrc:  Oral Oral Oral  SpO2: 96% 100% 100% 98%  Weight:      Height:        Intake/Output Summary (Last 24 hours) at 01/10/2020 1540 Last data filed at 01/10/2020 1300 Gross per 24 hour  Intake 2927.84 ml  Output 1475 ml  Net 1452.84 ml   Filed Weights   01/06/20 2035 01/07/20 1400 01/07/20 2123  Weight: 86.2 kg 83.6 kg 85.3 kg    Examination:  General exam: Pleasant male sitting up in bed, AAOx3, no distress CVS: S1-S2, regular rate rhythm Lungs: Clear bilaterally Abdomen: Soft, nontender, bowel sounds present Extremities: No edema Skin: No rashes on exposed skin  Data Reviewed: I have personally reviewed  following labs and imaging studies  CBC: Recent Labs  Lab 01/07/20 1242 01/08/20 0150 01/09/20 0316 01/10/20 0115  WBC 7.6 8.1 9.2 7.5  NEUTROABS 5.8  --   --   --   HGB 10.9* 10.7* 11.1* 10.8*  HCT 35.1* 33.4* 35.1* 33.6*  MCV 85.2 83.1 83.0 83.4  PLT 390 386 392 397   Basic Metabolic Panel: Recent Labs  Lab 01/07/20 1242 01/08/20 0150 01/09/20 0316 01/10/20 0115  NA 134* 134* 135 135  K 4.2 4.1 4.0 3.7  CL 101 101 102 102  CO2 23 24 22 24   GLUCOSE 156* 126* 137* 154*  BUN 7 9 11 8   CREATININE 0.92 0.73 0.75 0.79  CALCIUM 8.9 9.0 9.2 9.0   GFR: Estimated Creatinine Clearance: 114.2 mL/min (by C-G formula based on SCr of 0.79 mg/dL). Liver Function Tests: Recent Labs  Lab 01/07/20 1242  AST 29  ALT 8  ALKPHOS 98  BILITOT 0.8  PROT 6.3*  ALBUMIN 3.3*   No results for input(s): LIPASE, AMYLASE in the last 168 hours. No results for input(s): AMMONIA in the last 168 hours. Coagulation Profile: No results for input(s): INR, PROTIME in the last 168 hours. Cardiac Enzymes: No results for input(s): CKTOTAL, CKMB, CKMBINDEX, TROPONINI in the last 168 hours. BNP (last 3 results) No results for input(s): PROBNP in the last 8760 hours. HbA1C: No results for input(s): HGBA1C in the last 72 hours. CBG: No results for input(s): GLUCAP in the last 168 hours. Lipid Profile: No results for input(s): CHOL, HDL, LDLCALC, TRIG, CHOLHDL, LDLDIRECT in the last 72 hours. Thyroid Function Tests: No results for input(s): TSH, T4TOTAL, FREET4, T3FREE, THYROIDAB in the last 72 hours. Anemia Panel: Recent Labs    01/09/20 0316  VITAMINB12 216  FOLATE 17.6  FERRITIN 49  TIBC 381  IRON 37*  RETICCTPCT 1.1   Sepsis Labs: No results for input(s): PROCALCITON, LATICACIDVEN in the last 168 hours.  Recent Results (from the past 240 hour(s))  Respiratory Panel by RT PCR (Flu A&B, Covid) - Nasopharyngeal Swab     Status: None   Collection Time: 01/07/20  3:09 AM   Specimen:  Nasopharyngeal Swab  Result Value Ref Range Status   SARS Coronavirus 2 by RT PCR NEGATIVE NEGATIVE Final    Comment: (NOTE) SARS-CoV-2 target nucleic acids are NOT DETECTED.  The SARS-CoV-2 RNA is generally detectable in upper respiratoy specimens during the acute phase of infection. The lowest concentration of SARS-CoV-2 viral copies this assay can detect is 131 copies/mL. A negative result does not preclude SARS-Cov-2 infection and should not be used as the sole basis for treatment or other patient management decisions. A negative result may occur with  improper specimen collection/handling, submission of specimen other than nasopharyngeal swab, presence of viral mutation(s) within the areas targeted by this assay, and inadequate number of viral copies (<131 copies/mL). A negative result must be combined with clinical observations, patient history, and epidemiological information. The expected result is Negative.  Fact Sheet for Patients:  https://www.moore.com/  Fact Sheet for Healthcare Providers:  https://www.young.biz/  This test is no t yet approved or cleared by the [place] and  has been authorized for detection and/or diagnosis of SARS-CoV-2 by FDA under an Emergency Use Authorization (EUA). This EUA will remain  in effect (meaning this test can be used) for the duration of the COVID-19 declaration under Section 564(b)(1) of the Act, 21 U.S.C. section 360bbb-3(b)(1), unless the authorization is terminated or revoked sooner.     Influenza A by PCR NEGATIVE NEGATIVE Final   Influenza B by PCR NEGATIVE NEGATIVE Final    Comment: (NOTE) The Xpert Xpress SARS-CoV-2/FLU/RSV assay is intended as an aid in  the diagnosis of influenza from Nasopharyngeal swab specimens and  should not be used as a sole basis for treatment. Nasal washings and  aspirates are unacceptable for Xpert Xpress SARS-CoV-2/FLU/RSV  testing.  Fact Sheet  for Patients: https://www.moore.com/  Fact Sheet for Healthcare Providers: https://www.young.biz/  This test is not yet approved or cleared by the [place] FDA and  has been authorized for detection and/or diagnosis of SARS-CoV-2 by  FDA under an Emergency Use Authorization (EUA). This EUA will remain  in effect (meaning this test can be used) for the duration of the  Covid-19 declaration under Section 564(b)(1) of the Act, 21  U.S.C. section 360bbb-3(b)(1), unless the authorization is  terminated or revoked. Performed at [facility], [address], [place], [place] [address]   Culture, blood (routine x 2)     Status: None (Preliminary result)   Collection Time: 01/08/20  9:10 AM   Specimen: BLOOD  Result Value Ref Range Status   Specimen Description BLOOD RIGHT ANTECUBITAL  Final   Special Requests   Final    BOTTLES DRAWN AEROBIC AND ANAEROBIC Blood Culture adequate volume   Culture   Final    NO GROWTH 2 DAYS Performed at [facility], [address] [place], [place] [address]    Report Status PENDING  Incomplete  Culture, blood (routine x 2)     Status: None (Preliminary result)   Collection Time: 01/08/20  9:13 AM   Specimen: BLOOD  Result Value Ref Range Status   Specimen Description BLOOD LEFT ANTECUBITAL  Final   Special Requests   Final    BOTTLES DRAWN AEROBIC AND ANAEROBIC Blood Culture adequate volume   Culture   Final    NO GROWTH 2 DAYS Performed at [facility], [address] [place], [place] [address]    Report Status PENDING  Incomplete  Body fluid culture     Status: None (Preliminary result)   Collection Time: 01/08/20  3:01 PM   Specimen: Body Fluid  Result Value Ref Range Status   Specimen Description FLUID  Final   Special Requests DISC L1  Final   Gram Stain   Final    ABUNDANT WBC PRESENT,BOTH PMN AND MONONUCLEAR NO ORGANISMS SEEN    Culture   Final    NO GROWTH 2  DAYS Performed at [facility], [address] [place], [place] [address]    Report Status PENDING  Incomplete  Anaerobic culture     Status: None (Preliminary result)   Collection Time: 01/08/20  3:01 PM   Specimen: Fluid  Result Value Ref Range Status   Specimen Description FLUID  Final   Special Requests   Final    DISC L1 Performed at [facility], [address] [place], [place] [address]    Culture  Final    NO ANAEROBES ISOLATED; CULTURE IN PROGRESS FOR 5 DAYS   Report Status PENDING  Incomplete         Radiology Studies: MR ANKLE LEFT W WO CONTRAST  Result Date: 01/10/2020 CLINICAL DATA:  History of chronic ankle infection. Pain. The patient currently has multilevel discitis and osteomyelitis. EXAM: MRI OF THE LEFT ANKLE WITHOUT AND WITH CONTRAST TECHNIQUE: Multiplanar, multisequence MR imaging of the ankle was performed before and after the administration of intravenous contrast. CONTRAST:  8.5 mL GADAVIST IV SOLN COMPARISON:  Plain films left ankle 09/19/2018. FINDINGS: TENDONS Peroneal: Intact. Posteromedial: Intact. Anterior: Intact Achilles: . intact. Plantar Fascia: There is some edema in the medial cord consistent with plantar fasciitis. No rupture. No reactive marrow edema in a prominent calcaneal spur. LIGAMENTS Lateral: The anterior talofibular ligament appears chronically torn. Otherwise intact. Medial: Intact. CARTILAGE Ankle Joint: As seen on the comparison plain films, the patient has bone-on-bone joint space narrowing with cortical regularity consistent with remote osteomyelitis. There is mild marrow edema and enhancement about the joint but this is likely due to altered mechanics and degenerative change. No joint effusion or surrounding soft tissue. Subtalar Joints/Sinus Tarsi: Moderate subtalar osteoarthritis is present. Otherwise negative. Bones: Edema and cystic change are seen in the navicular. There is talonavicular joint space narrowing and  osteophytosis off the dorsal margin of the navicular. A small focus of edema and enhancement is seen in the posterior calcaneus just anterior to the distal Achilles tendon. Other: None. IMPRESSION: IMPRESSION Findings most consistent with chronic/remote osteomyelitis about the tibiotalar joint. Although there is some edema and enhancement about the joint, the appearance is most suggestive of chronic change and degenerative disease rather than infection. Edema and enhancement in the navicular cannot be definitively characterized but is also suggestive of degenerative disease. Small focus of edema and enhancement the dorsal calcaneus is nonspecific and could be due to infection. Negative for abscess or myositis. Findings compatible with plantar fasciitis of the medial cord. Electronically Signed   By: Drusilla Kanner M.D.   On: 01/10/2020 14:41   IR LUMBAR DISC ASPIRATION W/IMG GUIDE  Result Date: 01/10/2020 INDICATION: Severe low back pain.  Discitis/osteomyelitis at L1-L2. EXAM: BONE BIOPSY AT L1 MEDICATIONS: The patient is currently admitted to the hospital and receiving intravenous antibiotics. The antibiotics were administered within an appropriate time frame prior to the initiation of the procedure. ANESTHESIA/SEDATION: Fentanyl 2 mcg IV; Versed 75 mg IV Moderate Sedation Time: 42 minutes FLUOROSCOPY TIME:  13 minutes 42 seconds 1737 mGy The patient was continuously monitored during the procedure by the interventional radiology nurse under my direct supervision. COMPLICATIONS: None PROCEDURE: Informed written consent was obtained from the patient after a thorough discussion of the procedural risks, benefits and alternatives. All questions were addressed. Maximal Sterile Barrier Technique was utilized including caps, mask, sterile gowns, sterile gloves, sterile drape, hand hygiene and skin antiseptic. A timeout was performed prior to the initiation of the procedure. The patient was laid prone on the  fluoroscopic table. The skin overlying the lumbar region was then prepped and draped in the usual manner. The skin entry sites at L1-L2 were then infiltrated with 0.25% bupivacaine. A 21 gauge Franseen needle was then advanced to the posterior aspect of the L1-L2 disc space. Multiple attempts were made via bilateral approach to enter the disc space without event secondary to severe compression of the disc space associated with degenerative osteophytes overhanging the L1-L2 disc space posteriorly. A 13 gauge Cook spinal needle  was then advanced via the right posterolateral approach via the right pedicle into the junction of the anterior and the middle 1/3. A 16 gauge core biopsy needle was then advanced into the L1 vertebral body projecting inferiorly towards the disc space. Two passes were made using separate sterile needles. Using a 20 mL syringe, a core biopsy, and also an aspiration with a core biopsy was obtained and sent microbiologic analysis. Needles were removed. Hemostasis was achieved at the skin entry sites. Patient tolerated the procedure well. IMPRESSION: Status post fluoroscopic guided needle biopsy at L1 as described. Unsuccessful L1-L2 disc aspiration as described above. Electronically Signed   By: Julieanne Cotton M.D.   On: 01/09/2020 11:54        Scheduled Meds: . benztropine  1 mg Oral Daily  . buPROPion  300 mg Oral Daily  . cyclobenzaprine  5 mg Oral BID  . ferrous sulfate  325 mg Oral Q breakfast  . gabapentin  300 mg Oral BID  . losartan  100 mg Oral Daily  . mirtazapine  15 mg Oral QHS  . pantoprazole  40 mg Oral Daily  . sodium chloride flush  10-40 mL Intracatheter Q12H  . sodium chloride flush  3 mL Intravenous Q12H  . vitamin B-12  100 mcg Oral Daily   Continuous Infusions: . sodium chloride    . cefTRIAXone (ROCEPHIN)  IV 2 g (01/09/20 1948)  . vancomycin 1,500 mg (01/10/20 0442)     LOS: 3 days    Time spent: 25 minutes.    Zannie Cove, MD [facility]  01/10/2020, 3:40 PM

## 2020-01-11 DIAGNOSIS — Z8673 Personal history of transient ischemic attack (TIA), and cerebral infarction without residual deficits: Secondary | ICD-10-CM

## 2020-01-11 DIAGNOSIS — I10 Essential (primary) hypertension: Secondary | ICD-10-CM | POA: Diagnosis not present

## 2020-01-11 DIAGNOSIS — S2232XK Fracture of one rib, left side, subsequent encounter for fracture with nonunion: Secondary | ICD-10-CM | POA: Diagnosis not present

## 2020-01-11 DIAGNOSIS — M4626 Osteomyelitis of vertebra, lumbar region: Secondary | ICD-10-CM | POA: Diagnosis not present

## 2020-01-11 DIAGNOSIS — G894 Chronic pain syndrome: Secondary | ICD-10-CM | POA: Diagnosis not present

## 2020-01-11 MED ORDER — CYCLOBENZAPRINE HCL 5 MG PO TABS
5.0000 mg | ORAL_TABLET | Freq: Three times a day (TID) | ORAL | Status: DC
  Administered 2020-01-11 – 2020-01-16 (×16): 5 mg via ORAL
  Filled 2020-01-11 (×16): qty 1

## 2020-01-11 MED ORDER — SODIUM CHLORIDE 0.9 % IV SOLN
2.0000 g | Freq: Three times a day (TID) | INTRAVENOUS | Status: AC
  Administered 2020-01-11 – 2020-02-18 (×115): 2 g via INTRAVENOUS
  Filled 2020-01-11 (×117): qty 2

## 2020-01-11 NOTE — Progress Notes (Signed)
Physical Therapy Treatment Patient Details Name: [name] MRN: [id number] DOB: 1966/03/19 Today's Date: 01/11/2020    History of Present Illness Pt is a 54 y/o male with PMH of discitis April 2021, s/p laminectormy, schizophrenia, cocaine use, presenting with mechanical fall in the shower with shoulder and back pain. Imaging reveals: progresive discitits osteomyelitis of LS spine, Septic arthriits at L3-4, spinal stenosis L1-2 and improved discitis at L1-2.  Underwent apsiration/core biopsy 10/11, neurosurgery recommends medical tx. Acute non displaced Fx of anterolateral L 9th rib; R shoulder with no acute findings.     PT Comments    The pt is making good progress with functional mobility and PT goals at this time. He was able to complete >500 ft of hallway ambulation with both use of RW and IV pole without LOB. He was able to complete higher-level balance challenges such as stepping up and over objects, backwards walking, and tandem walking with use of HHA or IV pole and intermittent minA to regain stability. The pt reported no change in pain during today's session, and was able to complete transfers and short ambulation in his room independently with good management of the IV pole.     Follow Up Recommendations  No PT follow up     Equipment Recommendations  None recommended by PT    Recommendations for Other Services       Precautions / Restrictions Precautions Precautions: Fall Precaution Comments: low fall Restrictions Weight Bearing Restrictions: No    Mobility  Bed Mobility Overal bed mobility: Independent             General bed mobility comments: no assist required, educated on log rolling technique for back pain   Transfers Overall transfer level: Modified independent Equipment used: None Transfers: Sit to/from Stand Sit to Stand: Supervision         General transfer comment: supervision in room, pt able to safely use restroom and reports moving to  recliner without supervision or assist during the day  Ambulation/Gait Ambulation/Gait assistance: Supervision;Min guard Gait Distance (Feet): 1000 Feet Assistive device: Rolling walker (2 wheeled);IV Pole Gait Pattern/deviations: Step-through pattern;Decreased stride length Gait velocity: 0.67 m/s Gait velocity interpretation: 1.31 - 2.62 ft/sec, indicative of limited community ambulator General Gait Details: good stability with use of RW, able to walk 500 ft with use of RW and supervision, 500 ft with IV pole and minG       Balance Overall balance assessment: Needs assistance Sitting-balance support: Feet supported Sitting balance-[name]: Normal     Standing balance support: No upper extremity supported;During functional activity Standing balance-[name]: Good Standing balance comment: able to walk without UE suport, improved with single UE support             High level balance activites: Backward walking;Other (comment) (tandem walking) High Level Balance Comments: pt able to tolerate with minA through HHA of 1 or use of IV pole to steady. multiple minor LOB requiring 1-2 steps and minA to correct balance            Cognition Arousal/Alertness: Awake/alert Behavior During Therapy: WFL for tasks assessed/performed Overall Cognitive Status: History of cognitive impairments - at baseline                                 General Comments: appears WFL, but slight decrease in memory (reciting same information a couple of times during the session) needing cues for  safety awareness (especially with lines) at times in session      Exercises          Pertinent Vitals/Pain Pain Assessment: Faces Faces Pain Scale: Hurts a little bit Pain Location: back, R shoulder with movement  Pain Descriptors / Indicators: Discomfort Pain Intervention(s): Monitored during session;Limited activity within patient's tolerance           PT Goals (current goals  can now be found in the care plan section) Acute Rehab PT Goals Patient Stated Goal: to return home PT Goal Formulation: With patient Time For Goal Achievement: 01/23/20 Potential to Achieve Goals: Good Progress towards PT goals: Progressing toward goals    Frequency    Min 3X/week      PT Plan Current plan remains appropriate       AM-PAC PT "6 Clicks" Mobility   Outcome Measure  Help needed turning from your back to your side while in a flat bed without using bedrails?: None Help needed moving from lying on your back to sitting on the side of a flat bed without using bedrails?: None Help needed moving to and from a bed to a chair (including a wheelchair)?: None Help needed standing up from a chair using your arms (e.g., wheelchair or bedside chair)?: None Help needed to walk in hospital room?: None Help needed climbing 3-5 steps with a railing? : A Little 6 Click Score: 23    End of Session Equipment Utilized During Treatment: Gait belt Activity Tolerance: Patient tolerated treatment well Patient left: in bed;with call bell/phone within reach Nurse Communication: Mobility status PT Visit Diagnosis: Unsteadiness on feet (R26.81)     Time: 4782-9562 PT Time Calculation (min) (ACUTE ONLY): 19 min  Charges:  $Gait Training: 8-22 mins                     Rolm Baptise, PT, DPT   Acute Rehabilitation Department Pager #: [phone]   Gaetana Michaelis 01/11/2020, 5:05 PM

## 2020-01-11 NOTE — Progress Notes (Signed)
Subjective: No new complaints   Antibiotics:  Anti-infectives (From admission, onward)   Start     Dose/Rate Route Frequency Ordered Stop   01/11/20 1400  ceFEPIme (MAXIPIME) 2 g in sodium chloride 0.9 % 100 mL IVPB        2 g 200 mL/hr over 30 Minutes Intravenous Every 8 hours 01/11/20 1013     01/09/20 0400  vancomycin (VANCOREADY) IVPB 1500 mg/300 mL        1,500 mg 150 mL/hr over 120 Minutes Intravenous Every 12 hours 01/08/20 1900     01/08/20 1900  vancomycin (VANCOREADY) IVPB 1750 mg/350 mL        1,750 mg 175 mL/hr over 120 Minutes Intravenous  Once 01/08/20 1900 01/08/20 2240   01/08/20 1818  cefTRIAXone (ROCEPHIN) 2 g in sodium chloride 0.9 % 100 mL IVPB  Status:  Discontinued        2 g 200 mL/hr over 30 Minutes Intravenous Every 24 hours 01/08/20 1813 01/11/20 1011      Medications: Scheduled Meds: . benztropine  1 mg Oral Daily  . buPROPion  300 mg Oral Daily  . cyclobenzaprine  5 mg Oral TID  . ferrous sulfate  325 mg Oral Q breakfast  . gabapentin  300 mg Oral BID  . losartan  100 mg Oral Daily  . mirtazapine  15 mg Oral QHS  . pantoprazole  40 mg Oral Daily  . sodium chloride flush  10-40 mL Intracatheter Q12H  . sodium chloride flush  3 mL Intravenous Q12H  . vitamin B-12  100 mcg Oral Daily   Continuous Infusions: . sodium chloride    . ceFEPime (MAXIPIME) IV    . vancomycin 1,500 mg (01/11/20 0456)   PRN Meds:.sodium chloride, alum & mag hydroxide-simeth, ipratropium-albuterol, melatonin, morphine injection, ondansetron (ZOFRAN) IV, oxyCODONE, sodium chloride flush, sodium chloride flush    Objective: Weight change:   Intake/Output Summary (Last 24 hours) at 01/11/2020 1139 Last data filed at 01/11/2020 0849 Gross per 24 hour  Intake 1060.36 ml  Output 2300 ml  Net -1239.64 ml   Blood pressure (!) 131/92, pulse 100, temperature 98.4 F (36.9 C), temperature source Oral, resp. rate 19, height 5\' 9"  (1.753 m), weight 85.3 kg, SpO2  99 %. Temp:  [97.9 F (36.6 C)-99.1 F (37.3 C)] 98.4 F (36.9 C) (10/14 0922) Pulse Rate:  [100-110] 100 (10/14 0922) Resp:  [18-20] 19 (10/14 0922) BP: (121-133)/(85-92) 131/92 (10/14 0922) SpO2:  [98 %-100 %] 99 % (10/14 0922)  Physical Exam: General: Alert and awake, oriented x3, not in any acute distress. HEENT: anicteric sclera, EOMI CVS regular rate, normal  Chest: , no wheezing, no respiratory distress Abdomen: soft non-distended,  Foot is stable in appearance Neuro: nonfocal  CBC:    BMET Recent Labs    01/09/20 0316 01/10/20 0115  NA 135 135  K 4.0 3.7  CL 102 102  CO2 22 24  GLUCOSE 137* 154*  BUN 11 8  CREATININE 0.75 0.79  CALCIUM 9.2 9.0     Liver Panel  No results for input(s): PROT, ALBUMIN, AST, ALT, ALKPHOS, BILITOT, BILIDIR, IBILI in the last 72 hours.     Sedimentation Rate No results for input(s): ESRSEDRATE in the last 72 hours. C-Reactive Protein No results for input(s): CRP in the last 72 hours.  Micro Results: Recent Results (from the past 720 hour(s))  Respiratory Panel by RT PCR (Flu A&B, Covid) - Nasopharyngeal Swab  Status: None   Collection Time: 01/07/20  3:09 AM   Specimen: Nasopharyngeal Swab  Result Value Ref Range Status   SARS Coronavirus 2 by RT PCR NEGATIVE NEGATIVE Final    Comment: (NOTE) SARS-CoV-2 target nucleic acids are NOT DETECTED.  The SARS-CoV-2 RNA is generally detectable in upper respiratoy specimens during the acute phase of infection. The lowest concentration of SARS-CoV-2 viral copies this assay can detect is 131 copies/mL. A negative result does not preclude SARS-Cov-2 infection and should not be used as the sole basis for treatment or other patient management decisions. A negative result may occur with  improper specimen collection/handling, submission of specimen other than nasopharyngeal swab, presence of viral mutation(s) within the areas targeted by this assay, and inadequate number of  viral copies (<131 copies/mL). A negative result must be combined with clinical observations, patient history, and epidemiological information. The expected result is Negative.  Fact Sheet for Patients:  PinkCheek.be  Fact Sheet for Healthcare Providers:  GravelBags.it  This test is no t yet approved or cleared by the [place] FDA and  has been authorized for detection and/or diagnosis of SARS-CoV-2 by FDA under an Emergency Use Authorization (EUA). This EUA will remain  in effect (meaning this test can be used) for the duration of the COVID-19 declaration under Section 564(b)(1) of the Act, 21 U.S.C. section 360bbb-3(b)(1), unless the authorization is terminated or revoked sooner.     Influenza A by PCR NEGATIVE NEGATIVE Final   Influenza B by PCR NEGATIVE NEGATIVE Final    Comment: (NOTE) The Xpert Xpress SARS-CoV-2/FLU/RSV assay is intended as an aid in  the diagnosis of influenza from Nasopharyngeal swab specimens and  should not be used as a sole basis for treatment. Nasal washings and  aspirates are unacceptable for Xpert Xpress SARS-CoV-2/FLU/RSV  testing.  Fact Sheet for Patients: PinkCheek.be  Fact Sheet for Healthcare Providers: GravelBags.it  This test is not yet approved or cleared by the [place] FDA and  has been authorized for detection and/or diagnosis of SARS-CoV-2 by  FDA under an Emergency Use Authorization (EUA). This EUA will remain  in effect (meaning this test can be used) for the duration of the  Covid-19 declaration under Section 564(b)(1) of the Act, 21  U.S.C. section 360bbb-3(b)(1), unless the authorization is  terminated or revoked. Performed at [facility], [place] [address] [place], [place] [address]   Culture, blood (routine x 2)     Status: None (Preliminary result)   Collection Time: 01/08/20  9:10 AM    Specimen: BLOOD  Result Value Ref Range Status   Specimen Description BLOOD RIGHT ANTECUBITAL  Final   Special Requests   Final    BOTTLES DRAWN AEROBIC AND ANAEROBIC Blood Culture adequate volume   Culture   Final    NO GROWTH 2 DAYS Performed at [facility], [place] [address], [place], [place] [address]    Report Status PENDING  Incomplete  Culture, blood (routine x 2)     Status: None (Preliminary result)   Collection Time: 01/08/20  9:13 AM   Specimen: BLOOD  Result Value Ref Range Status   Specimen Description BLOOD LEFT ANTECUBITAL  Final   Special Requests   Final    BOTTLES DRAWN AEROBIC AND ANAEROBIC Blood Culture adequate volume   Culture   Final    NO GROWTH 2 DAYS Performed at [facility], [place] [address], [place],  [address]    Report Status PENDING  Incomplete  Body fluid culture     Status: None (Preliminary result)   Collection Time: 01/08/20  3:01 PM   Specimen: Body Fluid  Result Value Ref Range Status   Specimen Description FLUID  Final   Special Requests DISC L1  Final   Gram Stain   Final    ABUNDANT WBC PRESENT,BOTH PMN AND MONONUCLEAR NO ORGANISMS SEEN    Culture   Final    NO GROWTH 3 DAYS Performed at [facility], [address], [place], [place] [address]    Report Status PENDING  Incomplete  Anaerobic culture     Status: None (Preliminary result)   Collection Time: 01/08/20  3:01 PM   Specimen: Fluid  Result Value Ref Range Status   Specimen Description FLUID  Final   Special Requests   Final    DISC L1 Performed at [facility] [address] [place], [place] [address]    Culture   Final    NO ANAEROBES ISOLATED; CULTURE IN PROGRESS FOR 5 DAYS   Report Status PENDING  Incomplete    Studies/Results: MR ANKLE LEFT W WO CONTRAST  Result Date: 01/10/2020 CLINICAL DATA:  History of chronic ankle infection. Pain. The patient currently has multilevel discitis and osteomyelitis. EXAM: MRI OF THE LEFT ANKLE  WITHOUT AND WITH CONTRAST TECHNIQUE: Multiplanar, multisequence MR imaging of the ankle was performed before and after the administration of intravenous contrast. CONTRAST:  8.5 mL GADAVIST IV SOLN COMPARISON:  Plain films left ankle 09/19/2018. FINDINGS: TENDONS Peroneal: Intact. Posteromedial: Intact. Anterior: Intact Achilles: . intact. Plantar Fascia: There is some edema in the medial cord consistent with plantar fasciitis. No rupture. No reactive marrow edema in a prominent calcaneal spur. LIGAMENTS Lateral: The anterior talofibular ligament appears chronically torn. Otherwise intact. Medial: Intact. CARTILAGE Ankle Joint: As seen on the comparison plain films, the patient has bone-on-bone joint space narrowing with cortical regularity consistent with remote osteomyelitis. There is mild marrow edema and enhancement about the joint but this is likely due to altered mechanics and degenerative change. No joint effusion or surrounding soft tissue. Subtalar Joints/Sinus Tarsi: Moderate subtalar osteoarthritis is present. Otherwise negative. Bones: Edema and cystic change are seen in the navicular. There is talonavicular joint space narrowing and osteophytosis off the dorsal margin of the navicular. A small focus of edema and enhancement is seen in the posterior calcaneus just anterior to the distal Achilles tendon. Other: None. IMPRESSION: IMPRESSION Findings most consistent with chronic/remote osteomyelitis about the tibiotalar joint. Although there is some edema and enhancement about the joint, the appearance is most suggestive of chronic change and degenerative disease rather than infection. Edema and enhancement in the navicular cannot be definitively characterized but is also suggestive of degenerative disease. Small focus of edema and enhancement the dorsal calcaneus is nonspecific and could be due to infection. Negative for abscess or myositis. Findings compatible with plantar fasciitis of the medial cord.  Electronically Signed   By: Inge Rise M.D.   On: 01/10/2020 14:41      Assessment/Plan:  INTERVAL HISTORY: ORIF left foot with possible osteomyelitis and heel   Principal Problem:   Acute osteomyelitis of lumbar spine (HCC) Active Problems:   Cocaine abuse (HCC)   Benign essential HTN   History of CVA (cerebrovascular accident)   Chronic pain syndrome   Closed fracture of one rib of left side   Discitis of lumbar region    ROSENDO COUSER is a 54 y.o. male \  with recurrent and advancing vertebral infection now involving T12-L1, L1-L2 and L3-L4 with paraspinous edema/myositis w/o abscess but circumferential epidural phlegmon T12-L1 He had prior E cloacae in hardware associated foot infection.  We did an MRI which is not clear on whether he might have osteo in that foot still present. \ \ #1  Tubal osteomyelitis and discitis with abscess:  We will shift his antibiotics to vancomycin and cefepime (latter in case he has an enterobacter with AMP C) cephalosporinase)  2 possible osteomyelitis [name] is reached out to Dr. Jess Barters group to take a look at the patient and his films.     LOS: 4 days   Alcide Evener 01/11/2020, 11:39 AM

## 2020-01-11 NOTE — Progress Notes (Signed)
Pharmacy Antibiotic Note  [name] is a 54 y.o. male admitted on 01/06/2020 with prior history of lumbar osteomyelitis/discitis in May-June 2021 now with back pain post fall at home and MRI showing lumbar osteomyelitis.  Neurosurgery unable to get successful disc aspiration during biopsy. Patient with history of L ankle Enterobacter infection s/p hardware removal in 2019; ortho to see patient here, possible chronic v advancing osteo. Pharmacy has been consulted for Vancomycin and cefepime dosing to cover for Enterobacter.  Patient received Vancomycin in May-June and had therapeutic levels on 1500g IV every 12 hours with similar SCr/CrCl. Scr stable. Will check trough tomorrow AM.  Plan: Continue vancomycin  1500mg  IV every 12 hours Check vanc trough around tomorrow AM dose Start cefepime 2g q8h Monitor clinical status, renal function, culture results  Height: 5\' 9"  (175.3 cm) Weight: 85.3 kg (188 lb 2.3 oz) IBW/kg (Calculated) : 70.7  Temp (24hrs), Avg:98.4 F (36.9 C), Min:97.9 F (36.6 C), Max:99.1 F (37.3 C)  Recent Labs  Lab 01/07/20 1242 01/08/20 0150 01/09/20 0316 01/10/20 0115  WBC 7.6 8.1 9.2 7.5  CREATININE 0.92 0.73 0.75 0.79    Estimated Creatinine Clearance: 114.2 mL/min (by C-G formula based on SCr of 0.79 mg/dL).    Allergies  Allergen Reactions  . Chlorhexidine Itching and Rash    Antimicrobials this admission: CTX 10/11 >> 10/14 Vanc 10/11 >> Cefepime 10/14 >>   Microbiology results: 10/11 fluid, disc L1 cx: ngtd 10/11 BC x2: ngtd 10/10 COVID, flu: neg  Thank you for allowing pharmacy to be a part of this patient's care.  Alfonse Spruce, PharmD PGY2 ID Pharmacy Resident [phone]  01/11/2020 10:15 AM

## 2020-01-11 NOTE — Progress Notes (Addendum)
PROGRESS NOTE    [name]  ZOX:[id number] DOB: 1965-07-16 DOA: 01/06/2020 PCP: [name], MD   Brief Narrative: 54 year old with past medical history significant for discitis (April 2021 treated with 6 weeks of vancomycin and ceftriaxone, culture unrevealing), status post laminectomy, schizophrenia and ongoing cocaine use who presented to ED complaining of shoulder pain and back pain after a mechanical fall 4 days prior to admission. -Evaluation in the ED imaging noted isolated rib fracture without pneumothorax, LS-spine showed possible progressive discitis osteomyelitis.  MRI was performed which shows possible septic arthritis at L3-4 , associated epidural phlegmon and enhancement extending from T12 -L1.  Resultant moderate to severe diffuse spinal stenosis extending from L1 -L2.improved discitis at L1-2.   Patient was evaluated by neurosurgery, who commended medical treatment and IR guided disc space biopsy to guide antibiotics.   Assessment & Plan:   Recurrent discitis/osteomyelitis of L3-4, associated epidural phlegmon: -Previously with negative cultures, completed long course of IV ceftriaxone and vancomycin -IR attempted disc aspiration and biopsy, unsuccessful due to severe disc space narrowing and overhanging degenerative osteophytes -Blood cultures are negative -Appreciate infectious disease input -IV ceftriaxone and vancomycin day 3, ceftriaxone changed to cefepime today -MRI of left foot cannot rule out chronic osteomyelitis, no clinical signs of infection on exam previously had hardware infection which was removed in 2019, ID reached out to Dr. Lajoyce Corners for input -Pain control, increase Flexeril, physical therapy -Encouraged ambulation  HTN;  Continue Losartan.   History of ongoing cocaine, prior ETOH use; Quit alcohol 5 years ago.  -Reports using cocaine 1-2 times a week and heroin occasionally -Counseled  COPD; PRN nebulizer.   Schizophrenia;  On Invega Q3  months.  Continue with Cogentin, Wellbutrin, Remeron.   GERD; continue PPI.    Mild Hyponatremia; monitor. Na 134.   Anemia of chronic disease -Monitor  Acute non displace Fracture involving the anterolateral left ninth Rib;  Pain management. Incentive spirometry.   Estimated body mass index is 27.78 kg/m as calculated from the following:   Height as of this encounter: 5\' 9"  (1.753 m).   Weight as of this encounter: 85.3 kg.   DVT prophylaxis: SCDs Code Status: Full code Family Communication: Discussed with patient and girlfriend via telephone 10/13 Disposition Plan:  Status is: Inpatient  Remains inpatient appropriate because:IV treatments appropriate due to intensity of illness or inability to take PO   Dispo: The patient is from: Home              Anticipated d/c is to: to be determined              Anticipated d/c date is: Unknown              Patient currently is not medically stable to d/c.  Consultants:   IR  ID  Neurosurgery.   Procedures:   Disc aspiration Lumbar spine.   Antimicrobials:    Subjective: -Continues to have lower back pain and discomfort Objective: Vitals:   01/10/20 1726 01/10/20 2120 01/11/20 0457 01/11/20 0922  BP: (!) 133/92 121/85 (!) 125/91 (!) 131/92  Pulse: (!) 110 (!) 105 (!) 101 100  Resp: 20 18 18 19   Temp: 99.1 F (37.3 C) 98.2 F (36.8 C) 97.9 F (36.6 C) 98.4 F (36.9 C)  TempSrc: Oral Oral Oral Oral  SpO2: 100% 98% 99% 99%  Weight:      Height:        Intake/Output Summary (Last 24 hours) at 01/11/2020 1427 Last data filed  at 01/11/2020 0849 Gross per 24 hour  Intake 820.36 ml  Output 2300 ml  Net -1479.64 ml   Filed Weights   01/06/20 2035 01/07/20 1400 01/07/20 2123  Weight: 86.2 kg 83.6 kg 85.3 kg    Examination:  General, Pleasant male sitting up in bed, AAOx3, no distress CVS: S1-S2, regular rate rhythm Lungs: Clear bilaterally Abdomen: Soft, nontender, bowel sounds present Extremities: No  edema Skin: No rashes on exposed skin  Data Reviewed: I have personally reviewed following labs and imaging studies  CBC: Recent Labs  Lab 01/07/20 1242 01/08/20 0150 01/09/20 0316 01/10/20 0115  WBC 7.6 8.1 9.2 7.5  NEUTROABS 5.8  --   --   --   HGB 10.9* 10.7* 11.1* 10.8*  HCT 35.1* 33.4* 35.1* 33.6*  MCV 85.2 83.1 83.0 83.4  PLT 390 386 392 397   Basic Metabolic Panel: Recent Labs  Lab 01/07/20 1242 01/08/20 0150 01/09/20 0316 01/10/20 0115  NA 134* 134* 135 135  K 4.2 4.1 4.0 3.7  CL 101 101 102 102  CO2 23 24 22 24   GLUCOSE 156* 126* 137* 154*  BUN 7 9 11 8   CREATININE 0.92 0.73 0.75 0.79  CALCIUM 8.9 9.0 9.2 9.0   GFR: Estimated Creatinine Clearance: 114.2 mL/min (by C-G formula based on SCr of 0.79 mg/dL). Liver Function Tests: Recent Labs  Lab 01/07/20 1242  AST 29  ALT 8  ALKPHOS 98  BILITOT 0.8  PROT 6.3*  ALBUMIN 3.3*   No results for input(s): LIPASE, AMYLASE in the last 168 hours. No results for input(s): AMMONIA in the last 168 hours. Coagulation Profile: No results for input(s): INR, PROTIME in the last 168 hours. Cardiac Enzymes: No results for input(s): CKTOTAL, CKMB, CKMBINDEX, TROPONINI in the last 168 hours. BNP (last 3 results) No results for input(s): PROBNP in the last 8760 hours. HbA1C: No results for input(s): HGBA1C in the last 72 hours. CBG: No results for input(s): GLUCAP in the last 168 hours. Lipid Profile: No results for input(s): CHOL, HDL, LDLCALC, TRIG, CHOLHDL, LDLDIRECT in the last 72 hours. Thyroid Function Tests: No results for input(s): TSH, T4TOTAL, FREET4, T3FREE, THYROIDAB in the last 72 hours. Anemia Panel: Recent Labs    01/09/20 0316  VITAMINB12 216  FOLATE 17.6  FERRITIN 49  TIBC 381  IRON 37*  RETICCTPCT 1.1   Sepsis Labs: No results for input(s): PROCALCITON, LATICACIDVEN in the last 168 hours.  Recent Results (from the past 240 hour(s))  Respiratory Panel by RT PCR (Flu A&B, Covid) -  Nasopharyngeal Swab     Status: None   Collection Time: 01/07/20  3:09 AM   Specimen: Nasopharyngeal Swab  Result Value Ref Range Status   SARS Coronavirus 2 by RT PCR NEGATIVE NEGATIVE Final    Comment: (NOTE) SARS-CoV-2 target nucleic acids are NOT DETECTED.  The SARS-CoV-2 RNA is generally detectable in upper respiratoy specimens during the acute phase of infection. The lowest concentration of SARS-CoV-2 viral copies this assay can detect is 131 copies/mL. A negative result does not preclude SARS-Cov-2 infection and should not be used as the sole basis for treatment or other patient management decisions. A negative result may occur with  improper specimen collection/handling, submission of specimen other than nasopharyngeal swab, presence of viral mutation(s) within the areas targeted by this assay, and inadequate number of viral copies (<131 copies/mL). A negative result must be combined with clinical observations, patient history, and epidemiological information. The expected result is Negative.  Fact Sheet  for Patients:  https://www.moore.com/  Fact Sheet for Healthcare Providers:  https://www.young.biz/  This test is no t yet approved or cleared by the [place] FDA and  has been authorized for detection and/or diagnosis of SARS-CoV-2 by FDA under an Emergency Use Authorization (EUA). This EUA will remain  in effect (meaning this test can be used) for the duration of the COVID-19 declaration under Section 564(b)(1) of the Act, 21 U.S.C. section 360bbb-3(b)(1), unless the authorization is terminated or revoked sooner.     Influenza A by PCR NEGATIVE NEGATIVE Final   Influenza B by PCR NEGATIVE NEGATIVE Final    Comment: (NOTE) The Xpert Xpress SARS-CoV-2/FLU/RSV assay is intended as an aid in  the diagnosis of influenza from Nasopharyngeal swab specimens and  should not be used as a sole basis for treatment. Nasal washings and    aspirates are unacceptable for Xpert Xpress SARS-CoV-2/FLU/RSV  testing.  Fact Sheet for Patients: https://www.moore.com/  Fact Sheet for Healthcare Providers: https://www.young.biz/  This test is not yet approved or cleared by the [place] FDA and  has been authorized for detection and/or diagnosis of SARS-CoV-2 by  FDA under an Emergency Use Authorization (EUA). This EUA will remain  in effect (meaning this test can be used) for the duration of the  Covid-19 declaration under Section 564(b)(1) of the Act, 21  U.S.C. section 360bbb-3(b)(1), unless the authorization is  terminated or revoked. Performed at [facility], [address], [place], [place] [address]   Culture, blood (routine x 2)     Status: None (Preliminary result)   Collection Time: 01/08/20  9:10 AM   Specimen: BLOOD  Result Value Ref Range Status   Specimen Description BLOOD RIGHT ANTECUBITAL  Final   Special Requests   Final    BOTTLES DRAWN AEROBIC AND ANAEROBIC Blood Culture adequate volume   Culture   Final    NO GROWTH 3 DAYS Performed at [facility], [address] [place], [place] [address]    Report Status PENDING  Incomplete  Culture, blood (routine x 2)     Status: None (Preliminary result)   Collection Time: 01/08/20  9:13 AM   Specimen: BLOOD  Result Value Ref Range Status   Specimen Description BLOOD LEFT ANTECUBITAL  Final   Special Requests   Final    BOTTLES DRAWN AEROBIC AND ANAEROBIC Blood Culture adequate volume   Culture   Final    NO GROWTH 3 DAYS Performed at [facility], [address], [place], [place] [address]    Report Status PENDING  Incomplete  Body fluid culture     Status: None (Preliminary result)   Collection Time: 01/08/20  3:01 PM   Specimen: Body Fluid  Result Value Ref Range Status   Specimen Description FLUID  Final   Special Requests DISC L1  Final   Gram Stain   Final    ABUNDANT WBC PRESENT,BOTH  PMN AND MONONUCLEAR NO ORGANISMS SEEN    Culture   Final    NO GROWTH 3 DAYS Performed at [facility], [address], [place], [place] [address]    Report Status PENDING  Incomplete  Anaerobic culture     Status: None (Preliminary result)   Collection Time: 01/08/20  3:01 PM   Specimen: Fluid  Result Value Ref Range Status   Specimen Description FLUID  Final   Special Requests   Final    DISC L1 Performed at [facility],  [address], [place], [place] [address]    Culture   Final    NO ANAEROBES ISOLATED; CULTURE IN PROGRESS FOR 5 DAYS   Report Status PENDING  Incomplete         Radiology Studies: MR ANKLE LEFT W WO CONTRAST  Result Date: 01/10/2020 CLINICAL DATA:  History of chronic ankle infection. Pain. The patient currently has multilevel discitis and osteomyelitis. EXAM: MRI OF THE LEFT ANKLE WITHOUT AND WITH CONTRAST TECHNIQUE: Multiplanar, multisequence MR imaging of the ankle was performed before and after the administration of intravenous contrast. CONTRAST:  8.5 mL GADAVIST IV SOLN COMPARISON:  Plain films left ankle 09/19/2018. FINDINGS: TENDONS Peroneal: Intact. Posteromedial: Intact. Anterior: Intact Achilles: . intact. Plantar Fascia: There is some edema in the medial cord consistent with plantar fasciitis. No rupture. No reactive marrow edema in a prominent calcaneal spur. LIGAMENTS Lateral: The anterior talofibular ligament appears chronically torn. Otherwise intact. Medial: Intact. CARTILAGE Ankle Joint: As seen on the comparison plain films, the patient has bone-on-bone joint space narrowing with cortical regularity consistent with remote osteomyelitis. There is mild marrow edema and enhancement about the joint but this is likely due to altered mechanics and degenerative change. No joint effusion or surrounding soft tissue. Subtalar Joints/Sinus Tarsi: Moderate subtalar osteoarthritis is present. Otherwise negative. Bones: Edema and cystic change are  seen in the navicular. There is talonavicular joint space narrowing and osteophytosis off the dorsal margin of the navicular. A small focus of edema and enhancement is seen in the posterior calcaneus just anterior to the distal Achilles tendon. Other: None. IMPRESSION: IMPRESSION Findings most consistent with chronic/remote osteomyelitis about the tibiotalar joint. Although there is some edema and enhancement about the joint, the appearance is most suggestive of chronic change and degenerative disease rather than infection. Edema and enhancement in the navicular cannot be definitively characterized but is also suggestive of degenerative disease. Small focus of edema and enhancement the dorsal calcaneus is nonspecific and could be due to infection. Negative for abscess or myositis. Findings compatible with plantar fasciitis of the medial cord. Electronically Signed   By: Drusilla Kanner M.D.   On: 01/10/2020 14:41        Scheduled Meds: . benztropine  1 mg Oral Daily  . buPROPion  300 mg Oral Daily  . cyclobenzaprine  5 mg Oral TID  . ferrous sulfate  325 mg Oral Q breakfast  . gabapentin  300 mg Oral BID  . losartan  100 mg Oral Daily  . mirtazapine  15 mg Oral QHS  . pantoprazole  40 mg Oral Daily  . sodium chloride flush  10-40 mL Intracatheter Q12H  . sodium chloride flush  3 mL Intravenous Q12H  . vitamin B-12  100 mcg Oral Daily   Continuous Infusions: . sodium chloride    . ceFEPime (MAXIPIME) IV    . vancomycin 1,500 mg (01/11/20 0456)     LOS: 4 days   Time spent: 25 minutes.   Zannie Cove, MD [facility]  01/11/2020, 2:27 PM

## 2020-01-12 DIAGNOSIS — M24672 Ankylosis, left ankle: Secondary | ICD-10-CM | POA: Diagnosis not present

## 2020-01-12 DIAGNOSIS — S2232XK Fracture of one rib, left side, subsequent encounter for fracture with nonunion: Secondary | ICD-10-CM | POA: Diagnosis not present

## 2020-01-12 DIAGNOSIS — M4626 Osteomyelitis of vertebra, lumbar region: Secondary | ICD-10-CM | POA: Diagnosis not present

## 2020-01-12 DIAGNOSIS — G894 Chronic pain syndrome: Secondary | ICD-10-CM | POA: Diagnosis not present

## 2020-01-12 DIAGNOSIS — S2232XA Fracture of one rib, left side, initial encounter for closed fracture: Secondary | ICD-10-CM | POA: Diagnosis not present

## 2020-01-12 DIAGNOSIS — I10 Essential (primary) hypertension: Secondary | ICD-10-CM | POA: Diagnosis not present

## 2020-01-12 LAB — BASIC METABOLIC PANEL
Anion gap: 10 (ref 5–15)
BUN: 14 mg/dL (ref 6–20)
CO2: 24 mmol/L (ref 22–32)
Calcium: 9 mg/dL (ref 8.9–10.3)
Chloride: 101 mmol/L (ref 98–111)
Creatinine, Ser: 0.85 mg/dL (ref 0.61–1.24)
GFR, Estimated: >60 mL/min (ref >60)
Glucose, Bld: 98 mg/dL (ref 70–99)
Potassium: 4 mmol/L (ref 3.5–5.1)
Sodium: 135 mmol/L (ref 135–145)

## 2020-01-12 LAB — CBC
HCT: 34.9 % — ABNORMAL LOW (ref 39.0–52.0)
Hemoglobin: 10.9 g/dL — ABNORMAL LOW (ref 13.0–17.0)
MCH: 26.3 pg (ref 26.0–34.0)
MCHC: 31.2 g/dL (ref 30.0–36.0)
MCV: 84.3 fL (ref 80.0–100.0)
Platelets: 383 10*3/uL (ref 150–400)
RBC: 4.14 MIL/uL — ABNORMAL LOW (ref 4.22–5.81)
RDW: 15.3 % (ref 11.5–15.5)
WBC: 7.8 10*3/uL (ref 4.0–10.5)
nRBC: 0 % (ref 0.0–0.2)

## 2020-01-12 LAB — BODY FLUID CULTURE: Culture: NO GROWTH

## 2020-01-12 LAB — VANCOMYCIN, TROUGH: Vancomycin Tr: 22 ug/mL (ref 15–20)

## 2020-01-12 LAB — SEDIMENTATION RATE: Sed Rate: 38 mm/hr — ABNORMAL HIGH (ref 0–16)

## 2020-01-12 MED ORDER — NICOTINE 14 MG/24HR TD PT24
14.0000 mg | MEDICATED_PATCH | Freq: Every day | TRANSDERMAL | Status: DC
  Administered 2020-01-12 – 2020-02-20 (×40): 14 mg via TRANSDERMAL
  Filled 2020-01-12 (×40): qty 1

## 2020-01-12 MED ORDER — LIDOCAINE 5 % EX PTCH
1.0000 | MEDICATED_PATCH | CUTANEOUS | Status: DC
  Administered 2020-01-12 – 2020-02-19 (×39): 1 via TRANSDERMAL
  Filled 2020-01-12 (×40): qty 1

## 2020-01-12 NOTE — Progress Notes (Signed)
CRITICAL VALUE ALERT  Critical Value:  Vancomycin Trough - 22  Date & Time Notied: 01/12/20 @0530   Provider Notified: Sherlon Handing, [facility]  Orders Received/Actions taken: No new orders.

## 2020-01-12 NOTE — Progress Notes (Signed)
Pharmacy Antibiotic Note  [name] is a 54 y.o. male admitted on 01/06/2020 with prior history of lumbar osteomyelitis/discitis in May-June 2021 now with back pain post fall at home and MRI showing lumbar osteomyelitis.  Neurosurgery unable to get successful disc aspiration during biopsy. Patient with history of L ankle Enterobacter infection s/p hardware removal in 2019; ortho to see patient here, possible chronic v advancing osteo. Pharmacy has been consulted for Vancomycin and cefepime dosing to cover for Enterobacter.  Vancomycin trough 22 mcg/ml but dose last night was given about 4 hours late so true trough ~15 mcg/ml (therapeutic) on current dose.  Plan: Continue vancomycin 1500mg  IV every 12 hours Continue cefepime 2g q8h Monitor clinical status, renal function, culture results Vanc trough weekly  Height: 5\' 9"  (175.3 cm) Weight: 81.6 kg (179 lb 12.8 oz) IBW/kg (Calculated) : 70.7  Temp (24hrs), Avg:98.2 F (36.8 C), Min:97.4 F (36.3 C), Max:98.6 F (37 C)  Recent Labs  Lab 01/07/20 1242 01/08/20 0150 01/09/20 0316 01/10/20 0115 01/12/20 0327  WBC 7.6 8.1 9.2 7.5 7.8  CREATININE 0.92 0.73 0.75 0.79 0.85  VANCOTROUGH  --   --   --   --  22*    Estimated Creatinine Clearance: 99.3 mL/min (by C-G formula based on SCr of 0.85 mg/dL).    Allergies  Allergen Reactions  . Chlorhexidine Itching and Rash    Antimicrobials this admission: CTX 10/11 >> 10/14 Vanc 10/11 >> Cefepime 10/14 >>   Microbiology results: 10/11 fluid, disc L1 cx: ngtd 10/11 BC x2: ngtd 10/10 COVID, flu: neg  Thank you for allowing pharmacy to be a part of this patient's care.  Sherlon Handing, PharmD, BCPS Please see amion for complete clinical pharmacist phone list 01/12/2020 5:35 AM

## 2020-01-12 NOTE — Progress Notes (Signed)
PROGRESS NOTE    [name]  ZOX:[id number] DOB: 10/22/1965 DOA: 01/06/2020 PCP: [name], MD   Brief Narrative: 54 year old with past medical history significant for discitis (April 2021 treated with 6 weeks of vancomycin and ceftriaxone, culture unrevealing), status post laminectomy, schizophrenia and ongoing cocaine use who presented to ED complaining of shoulder pain and back pain after a mechanical fall 4 days prior to admission. -Evaluation in the ED imaging noted isolated rib fracture without pneumothorax, LS-spine showed possible progressive discitis osteomyelitis.  MRI was performed which shows possible septic arthritis at L3-4 , associated epidural phlegmon and enhancement extending from T12 -L1.  Resultant moderate to severe diffuse spinal stenosis extending from L1 -L2.improved discitis at L1-2.  -Patient was evaluated by neurosurgery, who commended medical treatment and IR guided disc space biopsy to guide antibiotics.   Assessment & Plan:   Recurrent discitis/osteomyelitis of L3-4, associated epidural phlegmon: -Previously with negative cultures, completed long course of IV ceftriaxone and vancomycin -IR attempted disc aspiration and biopsy, unsuccessful due to severe disc space narrowing and overhanging degenerative osteophytes -Blood cultures are negative -Appreciate infectious disease input -Initially treated with IV IV ceftriaxone and vancomycin, ceftriaxone changed to cefepime 10/14, vancomycin -MRI of left foot cannot rule out chronic osteomyelitis, no clinical signs of infection on exam previously had hardware infection which was removed in 2019, appreciate Dr. Audrie Lia input -Pain control, increase Flexeril, physical therapy -Add lidocaine patch, keep -Encouraged ambulation  HTN;  Continue Losartan.   History of ongoing cocaine, prior ETOH use; Quit alcohol 5 years ago.  -Reports using cocaine 1-2 times a week and heroin  occasionally -Counseled  COPD; PRN nebulizer.  Nicotine patch  Schizophrenia;  On Invega Q3 months.  Continue with Cogentin, Wellbutrin, Remeron.   GERD; continue PPI.    Mild Hyponatremia; monitor. Na 134.   Anemia of chronic disease -Monitor  Acute non displace Fracture involving the anterolateral left ninth Rib;  Pain management. Incentive spirometry.   Estimated body mass index is 26.55 kg/m as calculated from the following:   Height as of this encounter: 5\' 9"  (1.753 m).   Weight as of this encounter: 81.6 kg.   DVT prophylaxis: SCDs Code Status: Full code Family Communication: Discussed with patient and girlfriend via telephone 10/13 Disposition Plan:  Status is: Inpatient  Remains inpatient appropriate because:IV treatments appropriate due to intensity of illness or inability to take PO   Dispo: The patient is from: Home              Anticipated d/c is to: to be determined              Anticipated d/c date is: after 6 weeks of IV Abx              Patient currently is not medically stable to d/c.  Consultants:   IR  ID  Neurosurgery.   Procedures:   Disc aspiration Lumbar spine.   Antimicrobials:    Subjective: -Continues to have lower back discomfort/pain Objective: Vitals:   01/11/20 1741 01/11/20 2106 01/12/20 0515 01/12/20 0950  BP: 136/90 110/83 (!) 141/87 111/82  Pulse: (!) 106 (!) 105 (!) 101 (!) 107  Resp: 18 17 18 19   Temp: 98.4 F (36.9 C) 98.6 F (37 C) (!) 97.4 F (36.3 C) 98.6 F (37 C)  TempSrc:  Oral Oral Oral  SpO2: 100% 98% 98% 99%  Weight:  81.6 kg    Height:        Intake/Output Summary (Last  24 hours) at 01/12/2020 1414 Last data filed at 01/12/2020 1255 Gross per 24 hour  Intake 2559.21 ml  Output 1650 ml  Net 909.21 ml   Filed Weights   01/07/20 1400 01/07/20 2123 01/11/20 2106  Weight: 83.6 kg 85.3 kg 81.6 kg    Examination:  General, Pleasant male sitting up in bed, AAOx3, no distress CVS: S1-S2,  regular rate rhythm Lungs: Clear bilaterally Abdomen: Soft, nontender, bowel sounds present Extremities: No edema  Skin: No rashes on exposed skin  Data Reviewed: I have personally reviewed following labs and imaging studies  CBC: Recent Labs  Lab 01/07/20 1242 01/08/20 0150 01/09/20 0316 01/10/20 0115 01/12/20 0327  WBC 7.6 8.1 9.2 7.5 7.8  NEUTROABS 5.8  --   --   --   --   HGB 10.9* 10.7* 11.1* 10.8* 10.9*  HCT 35.1* 33.4* 35.1* 33.6* 34.9*  MCV 85.2 83.1 83.0 83.4 84.3  PLT 390 386 392 397 383   Basic Metabolic Panel: Recent Labs  Lab 01/07/20 1242 01/08/20 0150 01/09/20 0316 01/10/20 0115 01/12/20 0327  NA 134* 134* 135 135 135  K 4.2 4.1 4.0 3.7 4.0  CL 101 101 102 102 101  CO2 23 24 22 24 24   GLUCOSE 156* 126* 137* 154* 98  BUN 7 9 11 8 14   CREATININE 0.92 0.73 0.75 0.79 0.85  CALCIUM 8.9 9.0 9.2 9.0 9.0   GFR: Estimated Creatinine Clearance: 99.3 mL/min (by C-G formula based on SCr of 0.85 mg/dL). Liver Function Tests: Recent Labs  Lab 01/07/20 1242  AST 29  ALT 8  ALKPHOS 98  BILITOT 0.8  PROT 6.3*  ALBUMIN 3.3*   No results for input(s): LIPASE, AMYLASE in the last 168 hours. No results for input(s): AMMONIA in the last 168 hours. Coagulation Profile: No results for input(s): INR, PROTIME in the last 168 hours. Cardiac Enzymes: No results for input(s): CKTOTAL, CKMB, CKMBINDEX, TROPONINI in the last 168 hours. BNP (last 3 results) No results for input(s): PROBNP in the last 8760 hours. HbA1C: No results for input(s): HGBA1C in the last 72 hours. CBG: No results for input(s): GLUCAP in the last 168 hours. Lipid Profile: No results for input(s): CHOL, HDL, LDLCALC, TRIG, CHOLHDL, LDLDIRECT in the last 72 hours. Thyroid Function Tests: No results for input(s): TSH, T4TOTAL, FREET4, T3FREE, THYROIDAB in the last 72 hours. Anemia Panel: No results for input(s): VITAMINB12, FOLATE, FERRITIN, TIBC, IRON, RETICCTPCT in the last 72 hours. Sepsis  Labs: No results for input(s): PROCALCITON, LATICACIDVEN in the last 168 hours.  Recent Results (from the past 240 hour(s))  Respiratory Panel by RT PCR (Flu A&B, Covid) - Nasopharyngeal Swab     Status: None   Collection Time: 01/07/20  3:09 AM   Specimen: Nasopharyngeal Swab  Result Value Ref Range Status   SARS Coronavirus 2 by RT PCR NEGATIVE NEGATIVE Final    Comment: (NOTE) SARS-CoV-2 target nucleic acids are NOT DETECTED.  The SARS-CoV-2 RNA is generally detectable in upper respiratoy specimens during the acute phase of infection. The lowest concentration of SARS-CoV-2 viral copies this assay can detect is 131 copies/mL. A negative result does not preclude SARS-Cov-2 infection and should not be used as the sole basis for treatment or other patient management decisions. A negative result may occur with  improper specimen collection/handling, submission of specimen other than nasopharyngeal swab, presence of viral mutation(s) within the areas targeted by this assay, and inadequate number of viral copies (<131 copies/mL). A negative result must be  combined with clinical observations, patient history, and epidemiological information. The expected result is Negative.  Fact Sheet for Patients:  https://www.moore.com/  Fact Sheet for Healthcare Providers:  https://www.young.biz/  This test is no t yet approved or cleared by the [place] FDA and  has been authorized for detection and/or diagnosis of SARS-CoV-2 by FDA under an Emergency Use Authorization (EUA). This EUA will remain  in effect (meaning this test can be used) for the duration of the COVID-19 declaration under Section 564(b)(1) of the Act, 21 U.S.C. section 360bbb-3(b)(1), unless the authorization is terminated or revoked sooner.     Influenza A by PCR NEGATIVE NEGATIVE Final   Influenza B by PCR NEGATIVE NEGATIVE Final    Comment: (NOTE) The Xpert Xpress  SARS-CoV-2/FLU/RSV assay is intended as an aid in  the diagnosis of influenza from Nasopharyngeal swab specimens and  should not be used as a sole basis for treatment. Nasal washings and  aspirates are unacceptable for Xpert Xpress SARS-CoV-2/FLU/RSV  testing.  Fact Sheet for Patients: https://www.moore.com/  Fact Sheet for Healthcare Providers: https://www.young.biz/  This test is not yet approved or cleared by the [place] FDA and  has been authorized for detection and/or diagnosis of SARS-CoV-2 by  FDA under an Emergency Use Authorization (EUA). This EUA will remain  in effect (meaning this test can be used) for the duration of the  Covid-19 declaration under Section 564(b)(1) of the Act, 21  U.S.C. section 360bbb-3(b)(1), unless the authorization is  terminated or revoked. Performed at [facility], [address] [place], [place] [address]   Culture, blood (routine x 2)     Status: None (Preliminary result)   Collection Time: 01/08/20  9:10 AM   Specimen: BLOOD  Result Value Ref Range Status   Specimen Description BLOOD RIGHT ANTECUBITAL  Final   Special Requests   Final    BOTTLES DRAWN AEROBIC AND ANAEROBIC Blood Culture adequate volume   Culture   Final    NO GROWTH 4 DAYS Performed at [facility], [address] [place], [place] [address]    Report Status PENDING  Incomplete  Culture, blood (routine x 2)     Status: None (Preliminary result)   Collection Time: 01/08/20  9:13 AM   Specimen: BLOOD  Result Value Ref Range Status   Specimen Description BLOOD LEFT ANTECUBITAL  Final   Special Requests   Final    BOTTLES DRAWN AEROBIC AND ANAEROBIC Blood Culture adequate volume   Culture   Final    NO GROWTH 4 DAYS Performed at [facility], [address], [place], [place] [address]    Report Status PENDING  Incomplete  Body fluid culture     Status: None   Collection Time: 01/08/20  3:01 PM    Specimen: Body Fluid  Result Value Ref Range Status   Specimen Description FLUID  Final   Special Requests DISC L1  Final   Gram Stain   Final    ABUNDANT WBC PRESENT,BOTH PMN AND MONONUCLEAR NO ORGANISMS SEEN    Culture   Final    NO GROWTH 3 DAYS Performed at [facility], [address], [place], [place] [address]    Report Status 01/12/2020 FINAL  Final  Anaerobic culture     Status: None (Preliminary result)   Collection Time: 01/08/20  3:01 PM   Specimen: Fluid  Result Value Ref Range Status   Specimen Description FLUID  Final   Special  Requests   Final    DISC L1 Performed at [facility], [address], [place], [place] [address]    Culture   Final    NO ANAEROBES ISOLATED; CULTURE IN PROGRESS FOR 5 DAYS   Report Status PENDING  Incomplete     Scheduled Meds: . benztropine  1 mg Oral Daily  . buPROPion  300 mg Oral Daily  . cyclobenzaprine  5 mg Oral TID  . ferrous sulfate  325 mg Oral Q breakfast  . gabapentin  300 mg Oral BID  . losartan  100 mg Oral Daily  . mirtazapine  15 mg Oral QHS  . pantoprazole  40 mg Oral Daily  . sodium chloride flush  10-40 mL Intracatheter Q12H  . sodium chloride flush  3 mL Intravenous Q12H  . vitamin B-12  100 mcg Oral Daily   Continuous Infusions: . sodium chloride    . ceFEPime (MAXIPIME) IV 2 g (01/12/20 0617)  . vancomycin 1,500 mg (01/12/20 0412)     LOS: 5 days   Time spent: 25 minutes.   Zannie Cove, MD [facility]  01/12/2020, 2:14 PM

## 2020-01-12 NOTE — Progress Notes (Signed)
Occupational Therapy Treatment Patient Details Name: [name] MRN: [id number] DOB: [birth year] Today's Date: 01/12/2020    History of present illness Pt is a 54 y/o male with PMH of discitis April 2021, s/p laminectormy, schizophrenia, cocaine use, presenting with mechanical fall in the shower with shoulder and back pain. Imaging reveals: progresive discitits osteomyelitis of LS spine, Septic arthriits at L3-4, spinal stenosis L1-2 and improved discitis at L1-2.  Underwent apsiration/core biopsy 10/11, neurosurgery recommends medical tx. Acute non displaced Fx of anterolateral L 9th rib; R shoulder with no acute findings.    OT comments  Pt. Seen for skilled OT treatment session.  Able to complete lb dressing and toileting tasks. Provided and reviewed with pt. Fall prevention strategies.  Pt. Verbalized understanding of examples provided.    Follow Up Recommendations  No OT follow up;Supervision - Intermittent    Equipment Recommendations  3 in 1 bedside commode    Recommendations for Other Services      Precautions / Restrictions Precautions Precautions: Fall Restrictions Weight Bearing Restrictions: No       Mobility Bed Mobility Overal bed mobility: Independent                Transfers Overall transfer level: Modified independent Equipment used: None Transfers: Sit to/from [employer] Sit to Stand: Supervision Stand pivot transfers: Supervision            Balance                                           ADL either performed or assessed with clinical judgement   ADL Overall ADL's : Needs assistance/impaired                     Lower Body Dressing: Set up;Sitting/lateral leans Lower Body Dressing Details (indicate cue type and reason): pt. sits in bed and able to pull each leg in sideways to reach b les. reviewed not standing for donning or bending forward while sitting to decrease fall risks Toilet Transfer:  Min guard;Ambulation Toilet Transfer Details (indicate cue type and reason): pt. sat on the toilet for full demo but did not have to actually use the b.room         Functional mobility during ADLs: Min guard General ADL Comments: moving well, states he knows when he has to take rest breaks. spoke about wanting to increase his strength and endurance. states he wants to start walking more once he gets home.  reviewed fall prevention strategies with pt. he was able to verbalize examples i had provided at the end of session     Vision       Perception     Praxis      Cognition Arousal/Alertness: Awake/alert Behavior During Therapy: [facility] for tasks assessed/performed                                            Exercises     Shoulder Instructions       General Comments      Pertinent Vitals/ Pain       Pain Assessment: 0-10 Pain Score: 7  Pain Location: back, R shoulder with movement  Pain Descriptors / Indicators: Discomfort Pain Intervention(s): Limited activity within patient's tolerance;Monitored during session  Home Living                                          Prior Functioning/Environment              Frequency  Min 2X/week        Progress Toward Goals  OT Goals(current goals can now be found in the care plan section)  Progress towards OT goals: Progressing toward goals     Plan      Co-evaluation                 AM-PAC OT "6 Clicks" Daily Activity     Outcome Measure   Help from another person eating meals?: None Help from another person taking care of personal grooming?: A Little Help from another person toileting, which includes using toliet, bedpan, or urinal?: A Little Help from another person bathing (including washing, rinsing, drying)?: A Little Help from another person to put on and taking off regular upper body clothing?: A Little Help from another person to put on and taking off regular  lower body clothing?: A Little 6 Click Score: 19    End of Session    OT Visit Diagnosis: Other abnormalities of gait and mobility (R26.89);Pain Pain - part of body: Shoulder   Activity Tolerance Patient tolerated treatment well   Patient Left in bed;with call bell/phone within reach   Nurse Communication          Time: 6440-3474 OT Time Calculation (min): 8 min  Charges: OT General Charges $OT Visit: 1 Visit OT Treatments $Self Care/Home Management : 8-22 mins  Boneta Lucks, COTA/L Acute Rehabilitation [phone]   Robet Leu 01/12/2020, 12:52 PM

## 2020-01-12 NOTE — Consult Note (Addendum)
ORTHOPAEDIC CONSULTATION  REQUESTING PHYSICIAN: Domenic Polite, MD  Chief Complaint: Recurrent advanced vertebral infection status post previous treatment for infecte left ankle fusion.  HPI: [name] is a 54 y.o. male who presents with worsening lower back pain with history of vertebral infection.  Patient states he has decreased sensation on the plantar aspect of both feet but denies any ankle pain or swelling.  Patient does have rotator cuff pathology and states that he will need surgery for the rotator cuff adhesions.  Past Medical History:  Diagnosis Date  . Anxiety   . Arthritis   . Asthma   . Depression   . Duodenal adenoma   . GERD (gastroesophageal reflux disease)   . Hypertension   . Pneumonia    hx  . Post-traumatic osteoarthritis of left ankle   . Pre-diabetes   . Presence of right artificial knee joint 12/17/2016  . Schizophrenia ([place])   . Stroke [facility])    2009-or10   Past Surgical History:  Procedure Laterality Date  . ANKLE ARTHROSCOPY Left 10/20/2017   Procedure: ANKLE ARTHROSCOPY;  Surgeon: Newt Minion, MD;  Location: [place];  Service: Orthopedics;  Laterality: Left;  . ANKLE FUSION Left 10/20/2017  . ANKLE FUSION Left 10/20/2017   Procedure: LEFT ANKLE FUSION;  Surgeon: Newt Minion, MD;  Location: [place];  Service: Orthopedics;  Laterality: Left;  . ESOPHAGOGASTRODUODENOSCOPY Left 12/05/2013   Procedure: ESOPHAGOGASTRODUODENOSCOPY (EGD);  Surgeon: Arta Silence, MD;  Location: [facility];  Service: Endoscopy;  Laterality: Left;  . ESOPHAGOGASTRODUODENOSCOPY (EGD) WITH PROPOFOL N/A 02/20/2015   Procedure: ESOPHAGOGASTRODUODENOSCOPY (EGD) WITH PROPOFOL;  Surgeon: Wilford Corner, MD;  Location: [facility];  Service: Endoscopy;  Laterality: N/A;  . HARDWARE REMOVAL Left 01/29/2018   Procedure: HARDWARE REMOVAL LEFT ANKLE;  Surgeon: Newt Minion, MD;  Location: [place];  Service: Orthopedics;  Laterality: Left;  . IR LUMBAR [place] W/IMG  GUIDE  01/08/2020  . JOINT REPLACEMENT    . TOTAL KNEE ARTHROPLASTY Right 07/21/2016   Procedure: TOTAL KNEE ARTHROPLASTY;  Surgeon: Meredith Pel, MD;  Location: [place];  Service: Orthopedics;  Laterality: Right;  . UPPER GASTROINTESTINAL ENDOSCOPY     Social History   Socioeconomic History  . Marital status: Significant Other    Spouse name: Not on file  . Number of children: Not on file  . Years of education: Not on file  . Highest education level: Not on file  Occupational History  . Not on file  Tobacco Use  . Smoking status: Heavy Tobacco Smoker    Packs/day: 1.50    Years: 38.00    Pack years: 57.00    Types: Cigarettes  . Smokeless tobacco: Never Used  . Tobacco comment: onset age 61; upto 1.5 ppd  Vaping Use  . Vaping Use: Never used  Substance and Sexual Activity  . Alcohol use: Not Currently    Alcohol/week: 3.0 standard drinks    Types: 3 Cans of beer per week    Comment: quit 2015; formerly up to 2 fifths/day  . Drug use: No  . Sexual activity: Yes    Comment: declined condoms  Other Topics Concern  . Not on file  Social History Narrative  . Not on file   Social Determinants of Health   Financial Resource Strain:   . Difficulty of Paying Living Expenses: Not on file  Food Insecurity:   . Worried About Charity fundraiser in the Last Year: Not on file  .  Ran Out of Food in the Last Year: Not on file  Transportation Needs:   . Lack of Transportation (Medical): Not on file  . Lack of Transportation (Non-Medical): Not on file  Physical Activity:   . Days of Exercise per Week: Not on file  . Minutes of Exercise per Session: Not on file  Stress:   . Feeling of Stress : Not on file  Social Connections:   . Frequency of Communication with Friends and Family: Not on file  . Frequency of Social Gatherings with Friends and Family: Not on file  . Attends Religious Services: Not on file  . Active Member of Clubs or Organizations: Not on file  . Attends  Archivist Meetings: Not on file  . Marital Status: Not on file   Family History  Adopted: Yes  Problem Relation Age of Onset  . Colon cancer Neg Hx   . Esophageal cancer Neg Hx   . Rectal cancer Neg Hx   . Stomach cancer Neg Hx    - negative except otherwise stated in the family history section Allergies  Allergen Reactions  . Chlorhexidine Itching and Rash   Prior to Admission medications   Medication Sig Start Date End Date Taking? Authorizing Provider  benztropine (COGENTIN) 1 MG tablet Take 1 mg by mouth daily.   Yes [provider]  buPROPion (WELLBUTRIN XL) 300 MG 24 hr tablet Take 300 mg by mouth daily.   Yes [provider]  cetirizine (ZYRTEC) 10 MG tablet TAKE 1 TABLET BY MOUTH ONCE DAILY. Patient taking differently: Take 10 mg by mouth daily. No Therapeutic Substitution 03/28/18  Yes Diallo, Abdoulaye, MD  gabapentin (NEURONTIN) 300 MG capsule Take 1 capsule (300 mg total) by mouth 2 (two) times daily. 10/23/19  Yes Jessy Oto, MD  ibuprofen (ADVIL) 200 MG tablet Take 200 mg by mouth every 6 (six) hours as needed for fever, headache or moderate pain.   Yes [provider]  INVEGA TRINZA 819 MG/2.625ML SUSY Inject 819 mg into the skin every 3 (three) months. 09/08/17  Yes [provider]  losartan (COZAAR) 100 MG tablet Take 100 mg by mouth daily. 01/05/20  Yes [provider]  Melatonin 5 MG TABS Take 5 mg by mouth at bedtime as needed (sleep).  02/28/19  Yes [provider]  mirtazapine (REMERON) 15 MG tablet Take 15 mg by mouth at bedtime.   Yes [provider]  Multiple Vitamins-Minerals (VITRUM SENIOR) TABS Take 1 tablet by mouth daily.  04/30/15  Yes [provider]  omeprazole (PRILOSEC) 40 MG capsule TAKE 1 CAPSULE(40 MG) BY MOUTH DAILY Patient taking differently: Take 40 mg by mouth daily.  04/13/19  Yes Jessy Oto, MD  albuterol (PROVENTIL HFA;VENTOLIN HFA) 108 (90 Base) MCG/ACT  inhaler Inhale 1 puff into the lungs 2 (two) times daily as needed for wheezing or shortness of breath. Patient not taking: Reported on 01/07/2020 05/14/17   Marjie Skiff, MD  diltiazem (CARDIZEM CD) 240 MG 24 hr capsule Take 1 capsule (240 mg total) by mouth daily. Patient not taking: Reported on 01/07/2020 09/05/19   Aline August, MD  folic acid (FOLVITE) 1 MG tablet Take 1 tablet (1 mg total) by mouth daily. Patient not taking: Reported on 01/07/2020 09/05/19   Aline August, MD  ibuprofen (ADVIL) 400 MG tablet Take 1 tablet (400 mg total) by mouth 3 (three) times daily. Patient not taking: Reported on 01/07/2020 09/05/19   Aline August, MD  lidocaine (  LIDODERM) 5 % Place 1 patch onto the skin daily. Remove & Discard patch within 12 hours or as directed by MD Patient not taking: Reported on 01/07/2020 09/05/19   Aline August, MD  losartan (COZAAR) 50 MG tablet Take 1 tablet (50 mg total) by mouth daily. Patient not taking: Reported on 01/07/2020 09/05/19   Aline August, MD  methocarbamol (ROBAXIN) 750 MG tablet Take 1 tablet (750 mg total) by mouth every 6 (six) hours as needed for muscle spasms. Patient not taking: Reported on 01/07/2020 09/05/19   Aline August, MD  oxyCODONE (OXY IR/ROXICODONE) 5 MG immediate release tablet Take 1 tablet (5 mg total) by mouth every 6 (six) hours as needed for moderate pain. Patient not taking: Reported on 01/07/2020 09/05/19   Aline August, MD  oxyCODONE ER [facility]) 9 MG C12A Take 9 mg by mouth in the morning and at bedtime. Patient not taking: Reported on 01/07/2020 09/05/19   Aline August, MD  polyethylene glycol (MIRALAX / GLYCOLAX) 17 g packet Take 17 g by mouth 2 (two) times daily. Patient not taking: Reported on 01/07/2020 09/05/19   Aline August, MD  thiamine 100 MG tablet Take 1 tablet (100 mg total) by mouth daily. Patient not taking: Reported on 01/07/2020 09/05/19   Aline August, MD   No results found. - pertinent xrays, CT, MRI studies  were reviewed and independently interpreted  Positive ROS: All other systems have been reviewed and were otherwise negative with the exception of those mentioned in the HPI and as above.  Physical Exam: General: Alert, no acute distress Psychiatric: Patient is competent for consent with normal mood and affect Lymphatic: No axillary or cervical lymphadenopathy Cardiovascular: No pedal edema Respiratory: No cyanosis, no use of accessory musculature GI: No organomegaly, abdomen is soft and non-tender    Images:  @ENCIMAGES @  Labs:  Lab Results  Component Value Date   HGBA1C 6.6 (H) 07/27/2019   HGBA1C 5.6 04/07/2019   HGBA1C 5.6 11/05/2016   ESRSEDRATE 38 (H) 01/12/2020   ESRSEDRATE 55 (H) 08/15/2019   ESRSEDRATE 74 (H) 07/31/2019   CRP 2.1 (H) 01/07/2020   CRP 0.7 09/05/2019   CRP 1.4 (H) 08/15/2019   LABURIC 5.2 04/17/2014   REPTSTATUS 01/12/2020 FINAL 01/08/2020   REPTSTATUS PENDING 01/08/2020   GRAMSTAIN  01/08/2020    ABUNDANT WBC PRESENT,BOTH PMN AND MONONUCLEAR NO ORGANISMS SEEN    CULT  01/08/2020    NO GROWTH 3 DAYS Performed at [facility], [place] [address] [place], [place] [address]    CULT  01/08/2020    NO ANAEROBES ISOLATED; CULTURE IN PROGRESS FOR 5 DAYS   LABORGA ENTEROBACTER CLOACAE 01/29/2018    Lab Results  Component Value Date   ALBUMIN 3.3 (L) 01/07/2020   ALBUMIN 3.7 09/05/2019   ALBUMIN 3.2 (L) 08/09/2019   LABURIC 5.2 04/17/2014    Neurologic: Patient does not have protective sensation bilateral lower extremities.   MUSCULOSKELETAL:   Skin: Examination there is no redness no cellulitis no swelling no open ulcers around the left ankle the previous incisions are well-healed there is no pain to palpation there is no pain with active or passive range of motion of the ankle.  His foot is plantigrade his ankle is at 90 degrees.  There are no plantar ulcers.  Patient has a good dorsalis pedis pulse  Review of the MRI scan shows a  fibrous nonunion of the ankle fusion.  The increase MRI uptake is along the joint line there is  no increased uptake along the screw tracts that were previously infected.  Clinically this has the appearance of the fibrous nonunion with no MRI findings of recurrent osteomyelitis.  Assessment: Assessment: Fibrous nonunion left ankle with no clinical evidence of infection at this time.  Plan: Do not feel that surgical intervention is warranted for the left ankle, I will follow-up in the office to evaluate and treat the shoulder issues.  Thank you for the consult and the opportunity to see Mr. Lansing, MD [place] [phone] 12:59 PM

## 2020-01-12 NOTE — Progress Notes (Signed)
Subjective: No new complaints   Antibiotics:  Anti-infectives (From admission, onward)   Start     Dose/Rate Route Frequency Ordered Stop   01/11/20 1400  ceFEPIme (MAXIPIME) 2 g in sodium chloride 0.9 % 100 mL IVPB        2 g 200 mL/hr over 30 Minutes Intravenous Every 8 hours 01/11/20 1013     01/09/20 0400  vancomycin (VANCOREADY) IVPB 1500 mg/300 mL        1,500 mg 150 mL/hr over 120 Minutes Intravenous Every 12 hours 01/08/20 1900     01/08/20 1900  vancomycin (VANCOREADY) IVPB 1750 mg/350 mL        1,750 mg 175 mL/hr over 120 Minutes Intravenous  Once 01/08/20 1900 01/08/20 2240   01/08/20 1818  cefTRIAXone (ROCEPHIN) 2 g in sodium chloride 0.9 % 100 mL IVPB  Status:  Discontinued        2 g 200 mL/hr over 30 Minutes Intravenous Every 24 hours 01/08/20 1813 01/11/20 1011      Medications: Scheduled Meds: . benztropine  1 mg Oral Daily  . buPROPion  300 mg Oral Daily  . cyclobenzaprine  5 mg Oral TID  . ferrous sulfate  325 mg Oral Q breakfast  . gabapentin  300 mg Oral BID  . lidocaine  1 patch Transdermal Q24H  . losartan  100 mg Oral Daily  . mirtazapine  15 mg Oral QHS  . nicotine  14 mg Transdermal Daily  . pantoprazole  40 mg Oral Daily  . sodium chloride flush  10-40 mL Intracatheter Q12H  . sodium chloride flush  3 mL Intravenous Q12H  . vitamin B-12  100 mcg Oral Daily   Continuous Infusions: . sodium chloride    . ceFEPime (MAXIPIME) IV 2 g (01/12/20 1502)  . vancomycin 1,500 mg (01/12/20 1632)   PRN Meds:.sodium chloride, alum & mag hydroxide-simeth, ipratropium-albuterol, melatonin, morphine injection, ondansetron (ZOFRAN) IV, oxyCODONE, sodium chloride flush, sodium chloride flush    Objective: Weight change:   Intake/Output Summary (Last 24 hours) at 01/12/2020 1652 Last data filed at 01/12/2020 1300 Gross per 24 hour  Intake 2919.21 ml  Output 1650 ml  Net 1269.21 ml   Blood pressure 111/82, pulse (!) 107, temperature 98.6 F  (37 C), temperature source Oral, resp. rate 19, height 5\' 9"  (1.753 m), weight 81.6 kg, SpO2 99 %. Temp:  [97.4 F (36.3 C)-98.6 F (37 C)] 98.6 F (37 C) (10/15 0950) Pulse Rate:  [101-107] 107 (10/15 0950) Resp:  [17-19] 19 (10/15 0950) BP: (110-141)/(82-90) 111/82 (10/15 0950) SpO2:  [98 %-100 %] 99 % (10/15 0950) Weight:  [81.6 kg] 81.6 kg (10/14 2106)  Physical Exam: General: Alert and awake, oriented x3, not in any acute distress. HEENT: anicteric sclera, EOMI CVS regular rate, normal  Chest: , no wheezing, no respiratory distress Abdomen: soft non-distended,  Foot is stable in appearance Neuro: nonfocal  CBC:    BMET Recent Labs    01/10/20 0115 01/12/20 0327  NA 135 135  K 3.7 4.0  CL 102 101  CO2 24 24  GLUCOSE 154* 98  BUN 8 14  CREATININE 0.79 0.85  CALCIUM 9.0 9.0     Liver Panel  No results for input(s): PROT, ALBUMIN, AST, ALT, ALKPHOS, BILITOT, BILIDIR, IBILI in the last 72 hours.     Sedimentation Rate Recent Labs    01/12/20 0327  ESRSEDRATE 38*   C-Reactive Protein No results for input(s): CRP in  the last 72 hours.  Micro Results: Recent Results (from the past 720 hour(s))  Respiratory Panel by RT PCR (Flu A&B, Covid) - Nasopharyngeal Swab     Status: None   Collection Time: 01/07/20  3:09 AM   Specimen: Nasopharyngeal Swab  Result Value Ref Range Status   SARS Coronavirus 2 by RT PCR NEGATIVE NEGATIVE Final    Comment: (NOTE) SARS-CoV-2 target nucleic acids are NOT DETECTED.  The SARS-CoV-2 RNA is generally detectable in upper respiratoy specimens during the acute phase of infection. The lowest concentration of SARS-CoV-2 viral copies this assay can detect is 131 copies/mL. A negative result does not preclude SARS-Cov-2 infection and should not be used as the sole basis for treatment or other patient management decisions. A negative result may occur with  improper specimen collection/handling, submission of specimen  other than nasopharyngeal swab, presence of viral mutation(s) within the areas targeted by this assay, and inadequate number of viral copies (<131 copies/mL). A negative result must be combined with clinical observations, patient history, and epidemiological information. The expected result is Negative.  Fact Sheet for Patients:  PinkCheek.be  Fact Sheet for Healthcare Providers:  GravelBags.it  This test is no t yet approved or cleared by the [place] FDA and  has been authorized for detection and/or diagnosis of SARS-CoV-2 by FDA under an Emergency Use Authorization (EUA). This EUA will remain  in effect (meaning this test can be used) for the duration of the COVID-19 declaration under Section 564(b)(1) of the Act, 21 U.S.C. section 360bbb-3(b)(1), unless the authorization is terminated or revoked sooner.     Influenza A by PCR NEGATIVE NEGATIVE Final   Influenza B by PCR NEGATIVE NEGATIVE Final    Comment: (NOTE) The Xpert Xpress SARS-CoV-2/FLU/RSV assay is intended as an aid in  the diagnosis of influenza from Nasopharyngeal swab specimens and  should not be used as a sole basis for treatment. Nasal washings and  aspirates are unacceptable for Xpert Xpress SARS-CoV-2/FLU/RSV  testing.  Fact Sheet for Patients: PinkCheek.be  Fact Sheet for Healthcare Providers: GravelBags.it  This test is not yet approved or cleared by the [place] FDA and  has been authorized for detection and/or diagnosis of SARS-CoV-2 by  FDA under an Emergency Use Authorization (EUA). This EUA will remain  in effect (meaning this test can be used) for the duration of the  Covid-19 declaration under Section 564(b)(1) of the Act, 21  U.S.C. section 360bbb-3(b)(1), unless the authorization is  terminated or revoked. Performed at [facility], [place] [address],  [place], [place] [address]   Culture, blood (routine x 2)     Status: None (Preliminary result)   Collection Time: 01/08/20  9:10 AM   Specimen: BLOOD  Result Value Ref Range Status   Specimen Description BLOOD RIGHT ANTECUBITAL  Final   Special Requests   Final    BOTTLES DRAWN AEROBIC AND ANAEROBIC Blood Culture adequate volume   Culture   Final    NO GROWTH 4 DAYS Performed at [facility], [place] [address], [place],  [address]    Report Status PENDING  Incomplete  Culture, blood (routine x 2)     Status: None (Preliminary result)   Collection Time: 01/08/20  9:13 AM   Specimen: BLOOD  Result Value Ref Range Status   Specimen Description BLOOD LEFT ANTECUBITAL  Final   Special Requests   Final    BOTTLES DRAWN AEROBIC AND ANAEROBIC Blood Culture adequate volume  Culture   Final    NO GROWTH 4 DAYS Performed at [facility], [place] [address] [place], [place] [address]    Report Status PENDING  Incomplete  Body fluid culture     Status: None   Collection Time: 01/08/20  3:01 PM   Specimen: Body Fluid  Result Value Ref Range Status   Specimen Description FLUID  Final   Special Requests DISC L1  Final   Gram Stain   Final    ABUNDANT WBC PRESENT,BOTH PMN AND MONONUCLEAR NO ORGANISMS SEEN    Culture   Final    NO GROWTH 3 DAYS Performed at [facility], [address], [place], [place] [address]    Report Status 01/12/2020 FINAL  Final  Anaerobic culture     Status: None (Preliminary result)   Collection Time: 01/08/20  3:01 PM   Specimen: Fluid  Result Value Ref Range Status   Specimen Description FLUID  Final   Special Requests   Final    DISC L1 Performed at [place] [address] [place], [place] [address]    Culture   Final    NO ANAEROBES ISOLATED; CULTURE IN PROGRESS FOR 5 DAYS   Report Status PENDING  Incomplete    Studies/Results: No results found.    Assessment/Plan:  INTERVAL HISTORY: pt seen by Dr  Sharol Given   Principal Problem:   Acute osteomyelitis of lumbar spine ([place]) Active Problems:   Cocaine abuse ([place])   Benign essential HTN   History of CVA (cerebrovascular accident)   Chronic pain syndrome   Closed fracture of one rib of left side   Discitis of lumbar region   Ankle ankylosis, left    [name] is a 54 y.o. male \ with recurrent and advancing vertebral infection now involving T12-L1, L1-L2 and L3-L4 with paraspinous edema/myositis w/o abscess but circumferential epidural phlegmon T12-L1 He had prior E cloacae in hardware associated foot infection.  We did an MRI which is not clear on whether he might have osteo in that foot still present.   #1  Vertebral osteomyelitis and discitis with abscess:  We will shift his antibiotics to vancomycin and cefepime (latter in case he has an enterobacter with AMP C) cephalosporinase)  Cultures are not growing anything aerobically or anaerobically so far (one more day of incubation for latter)  Would complete 6 weeks of IV therapy in house and then convert to po antibiotics, likely keflex and doxycycline and treat po for another month with followup  #2 MRI findings in foot: Greatly appreciate Dr. Sharol Given seeing the patient and he believes this is more likely fibrinous nonunion than infection  We will followup the final culture data and then sign off  Please call us back when he is nearing completion of his IV abx so we can schedule him in our clinic and change over to po abx  Dr Juleen China is available for questions this weekend.      LOS: 5 days   Alcide Evener 01/12/2020, 4:52 PM

## 2020-01-13 LAB — ANAEROBIC CULTURE

## 2020-01-13 LAB — CULTURE, BLOOD (ROUTINE X 2)
Culture: NO GROWTH
Culture: NO GROWTH
Special Requests: ADEQUATE
Special Requests: ADEQUATE

## 2020-01-13 LAB — HCV AB W REFLEX TO QUANT PCR: HCV Ab: >11 s/co ratio — ABNORMAL HIGH (ref 0.0–0.9)

## 2020-01-13 LAB — HCV RT-PCR, QUANT (NON-GRAPH): Hepatitis C Quantitation: NOT DETECTED IU/mL

## 2020-01-13 NOTE — Plan of Care (Signed)
  Problem: Education: Goal: Knowledge of General Education information will improve Description: Including pain rating scale, medication(s)/side effects and non-pharmacologic comfort measures Outcome: Progressing   Problem: Health Behavior/Discharge Planning: Goal: Ability to manage health-related needs will improve Outcome: Progressing   Problem: Clinical Measurements: Goal: Ability to maintain clinical measurements within normal limits will improve Outcome: Progressing Goal: Will remain free from infection Outcome: Progressing Goal: Diagnostic test results will improve Outcome: Progressing Goal: Cardiovascular complication will be avoided Outcome: Progressing   Problem: Activity: Goal: Risk for activity intolerance will decrease Outcome: Progressing   Problem: Coping: Goal: Level of anxiety will decrease Outcome: Progressing   Problem: Elimination: Goal: Will not experience complications related to bowel motility Outcome: Progressing Goal: Will not experience complications related to urinary retention Outcome: Progressing   Problem: Pain Managment: Goal: General experience of comfort will improve Outcome: Progressing   Problem: Safety: Goal: Ability to remain free from injury will improve Outcome: Progressing

## 2020-01-13 NOTE — Progress Notes (Signed)
Patient seen and examined, resting comfortably in bed -Continues to have lower back discomfort -Continue vancomycin and cefepime for vertebral osteomyelitis/discitis with abscess -Continue oral pain meds, Flexeril, lidocaine patch -Encouraged ambulation  Domenic Polite, MD

## 2020-01-14 MED ORDER — MORPHINE SULFATE (PF) 2 MG/ML IV SOLN
2.0000 mg | INTRAVENOUS | Status: DC | PRN
  Administered 2020-01-14 – 2020-01-17 (×14): 2 mg via INTRAVENOUS
  Filled 2020-01-14 (×15): qty 1

## 2020-01-14 MED ORDER — OXYCODONE HCL 5 MG PO TABS
10.0000 mg | ORAL_TABLET | Freq: Four times a day (QID) | ORAL | Status: DC | PRN
  Administered 2020-01-14 – 2020-01-20 (×16): 10 mg via ORAL
  Filled 2020-01-14 (×16): qty 2

## 2020-01-14 MED ORDER — SENNOSIDES-DOCUSATE SODIUM 8.6-50 MG PO TABS
1.0000 | ORAL_TABLET | Freq: Two times a day (BID) | ORAL | Status: DC
  Administered 2020-01-14 – 2020-02-20 (×73): 1 via ORAL
  Filled 2020-01-14 (×74): qty 1

## 2020-01-14 NOTE — Progress Notes (Signed)
Patient seen and examined, 54/M with recurrent vertebral osteomyelitis/discitis with abscess - sitting up in bed, continue vancomycin and cefepime -Increase oxycodone, continue Flexeril, lidocaine patch -Awaiting completion of 6 weeks of antibiotics  Domenic Polite, MD

## 2020-01-15 DIAGNOSIS — G894 Chronic pain syndrome: Secondary | ICD-10-CM | POA: Diagnosis not present

## 2020-01-15 DIAGNOSIS — I10 Essential (primary) hypertension: Secondary | ICD-10-CM | POA: Diagnosis not present

## 2020-01-15 DIAGNOSIS — S2232XK Fracture of one rib, left side, subsequent encounter for fracture with nonunion: Secondary | ICD-10-CM | POA: Diagnosis not present

## 2020-01-15 DIAGNOSIS — M4626 Osteomyelitis of vertebra, lumbar region: Secondary | ICD-10-CM | POA: Diagnosis not present

## 2020-01-15 LAB — BASIC METABOLIC PANEL
Anion gap: 7 (ref 5–15)
BUN: 13 mg/dL (ref 6–20)
CO2: 24 mmol/L (ref 22–32)
Calcium: 9.2 mg/dL (ref 8.9–10.3)
Chloride: 104 mmol/L (ref 98–111)
Creatinine, Ser: 0.79 mg/dL (ref 0.61–1.24)
GFR, Estimated: >60 mL/min (ref >60)
Glucose, Bld: 112 mg/dL — ABNORMAL HIGH (ref 70–99)
Potassium: 3.9 mmol/L (ref 3.5–5.1)
Sodium: 135 mmol/L (ref 135–145)

## 2020-01-15 NOTE — Plan of Care (Signed)
  Problem: Education: Goal: Knowledge of General Education information will improve Description: Including pain rating scale, medication(s)/side effects and non-pharmacologic comfort measures Outcome: Progressing   Problem: Health Behavior/Discharge Planning: Goal: Ability to manage health-related needs will improve Outcome: Progressing   Problem: Clinical Measurements: Goal: Ability to maintain clinical measurements within normal limits will improve Outcome: Progressing Goal: Will remain free from infection Outcome: Progressing Goal: Diagnostic test results will improve Outcome: Progressing Goal: Cardiovascular complication will be avoided Outcome: Progressing   Problem: Activity: Goal: Risk for activity intolerance will decrease Outcome: Progressing   Problem: Coping: Goal: Level of anxiety will decrease Outcome: Progressing   Problem: Elimination: Goal: Will not experience complications related to bowel motility Outcome: Progressing Goal: Will not experience complications related to urinary retention Outcome: Progressing   Problem: Pain Managment: Goal: General experience of comfort will improve Outcome: Progressing   Problem: Safety: Goal: Ability to remain free from injury will improve Outcome: Progressing

## 2020-01-15 NOTE — Progress Notes (Signed)
Occupational Therapy Treatment Patient Details Name: [name] MRN: [id number] DOB: Aug 07, 1965 Today's Date: 01/15/2020    History of present illness Pt is a 54 y/o male with PMH of discitis April 2021, s/p laminectormy, schizophrenia, cocaine use, presenting with mechanical fall in the shower with shoulder and back pain. Imaging reveals: progresive discitits osteomyelitis of LS spine, Septic arthriits at L3-4, spinal stenosis L1-2 and improved discitis at L1-2.  Underwent apsiration/core biopsy 10/11, neurosurgery recommends medical tx. Acute non displaced Fx of anterolateral L 9th rib; R shoulder with no acute findings.    OT comments  Pt making steady progress towards OT goals this session. Pt supine in bed upon OTA arrival with pt c/o 8/10 pain but agreeable to OT intervention. Pt continues to present with pain and decreased activity tolerance impacting pts ability to complete BADLs. Overall, pt requires close supervision for household distance functional mobility with no AD. Pt required cues for safety awareness related to managing IV pole during mobility. Pt required supervision for standing grooming tasks at sink and supervision for LB ADLs. Agree with DC plan below, will follow acutely per POC.   Follow Up Recommendations  No OT follow up;Supervision - Intermittent    Equipment Recommendations  3 in 1 bedside commode    Recommendations for Other Services      Precautions / Restrictions Precautions Precautions: Fall Precaution Comments: low fall Restrictions Weight Bearing Restrictions: No       Mobility Bed Mobility Overal bed mobility: Independent             General bed mobility comments: no assist required, educated on log rolling technique for back pain   Transfers Overall transfer level: Modified independent Equipment used: None Transfers: Sit to/from Stand Sit to Stand: Supervision         General transfer comment: close supervision with no AD     Balance Overall balance assessment: Needs assistance Sitting-balance support: Feet supported Sitting balance-[name]: Normal     Standing balance support: No upper extremity supported;During functional activity Standing balance-[name]: Good Standing balance comment: able to perform dynamic tasks related to ADLs with no UE support                           ADL either performed or assessed with clinical judgement   ADL Overall ADL's : Needs assistance/impaired     Grooming: Oral care;Wash/dry face;Standing;Supervision/safety Grooming Details (indicate cue type and reason): gross supervision     Lower Body Bathing: Supervison/ safety;Sitting/lateral leans Lower Body Bathing Details (indicate cue type and reason): simulated via LB dressing task from EOB     Lower Body Dressing: Supervision/safety;Sitting/lateral leans Lower Body Dressing Details (indicate cue type and reason): pt able to adjust socks from EOB with no LOB Toilet Transfer: Min guard;Ambulation Toilet Transfer Details (indicate cue type and reason): simulated via functional mobility with min guard for safety with no AD or LOB       Tub/Shower Transfer Details (indicate cue type and reason): pt reports tub shower at home, may need shower seat to decrease risk of falls? discussed with pt with pt agreeable to DME if needed Functional mobility during ADLs: Min guard General ADL Comments: pt continues to be moving well, wanting to return home however per chart review may need 6 wks of IV antibiotics. pt c/o pain when asked but does not c/o of pain during mobility     Vision  Perception     Praxis      Cognition Arousal/Alertness: Awake/alert (Simultaneous filing. User may not have seen previous data.) Behavior During Therapy: [facility] for tasks assessed/performed (Simultaneous filing. User may not have seen previous data.) Overall Cognitive Status: History of cognitive impairments - at baseline  (Simultaneous filing. User may not have seen previous data.)                                 General Comments: overall WFL but did note some impulsivity and decreased safety awareness based on pt impulsively standing up from EOB despite telling pt to wait until IV was positioned safely. aware of day of week but did report extraneous information throughout session unrelated to session        Exercises     Shoulder Instructions       General Comments      Pertinent Vitals/ Pain       Pain Assessment: Faces Pain Score: 8  Faces Pain Scale: Hurts little more Pain Location: back Pain Descriptors / Indicators: Discomfort;Sore Pain Intervention(s): Monitored during session;Repositioned  Home Living                                          Prior Functioning/Environment              Frequency  Min 2X/week        Progress Toward Goals  OT Goals(current goals can now be found in the care plan section)  Progress towards OT goals: Progressing toward goals  Acute Rehab OT Goals Patient Stated Goal: to return home OT Goal Formulation: With patient Time For Goal Achievement: 01/24/20 Potential to Achieve Goals: Good  Plan Discharge plan remains appropriate;Frequency remains appropriate    Co-evaluation                 AM-PAC OT "6 Clicks" Daily Activity     Outcome Measure   Help from another person eating meals?: None Help from another person taking care of personal grooming?: None Help from another person toileting, which includes using toliet, bedpan, or urinal?: A Little Help from another person bathing (including washing, rinsing, drying)?: A Little Help from another person to put on and taking off regular upper body clothing?: None Help from another person to put on and taking off regular lower body clothing?: None 6 Click Score: 22    End of Session    OT Visit Diagnosis: Other abnormalities of gait and mobility  (R26.89);Pain Pain - Right/Left: Right Pain - part of body: Shoulder   Activity Tolerance Patient tolerated treatment well   Patient Left in bed;with call bell/phone within reach;Other (comment) (MD entering room)   Nurse Communication Mobility status        Time: [phone] OT Time Calculation (min): 10 min  Charges: OT General Charges $OT Visit: 1 Visit OT Treatments $Self Care/Home Management : 8-22 mins  Audery Amel., COTA/L Acute Rehabilitation Services [phone]    Angelina Pih 01/15/2020, 8:52 AM

## 2020-01-15 NOTE — Progress Notes (Signed)
Physical Therapy Treatment Patient Details Name: [name] MRN: [id number] DOB: 1965-06-03 Today's Date: 01/15/2020    History of Present Illness Pt is a 54 y/o male with PMH of discitis April 2021, s/p laminectormy, schizophrenia, cocaine use, presenting with mechanical fall in the shower with shoulder and back pain. Imaging reveals: progresive discitits osteomyelitis of LS spine, Septic arthriits at L3-4, spinal stenosis L1-2 and improved discitis at L1-2.  Underwent apsiration/core biopsy 10/11, neurosurgery recommends medical tx. Acute non displaced Fx of anterolateral L 9th rib; R shoulder with no acute findings.     PT Comments    Pt received in bed, agreeable to participation in therapy. He demonstrates independence with bed mobility and transfers. Ambulated 600' pushing IV pole, initially requiring supervision but progressed to min guard assist with fatigue. Pt sitting EOB at end of session. Pt very grateful for PT intervention.    Follow Up Recommendations  No PT follow up     Equipment Recommendations  None recommended by PT    Recommendations for Other Services       Precautions / Restrictions Precautions Precautions: Fall Precaution Comments: low fall Restrictions Weight Bearing Restrictions: No    Mobility  Bed Mobility Overal bed mobility: Independent             General bed mobility comments: no assist required, educated on log rolling technique for back pain   Transfers Overall transfer level: Modified independent Equipment used: None Transfers: Sit to/from Stand Sit to Stand: Supervision         General transfer comment: close supervision with no AD  Ambulation/Gait Ambulation/Gait assistance: Supervision;Min guard Gait Distance (Feet): 600 Feet Assistive device: IV Pole Gait Pattern/deviations: Step-through pattern;Decreased stride length Gait velocity: mildly decreased Gait velocity interpretation: 1.31 - 2.62 ft/sec, indicative of  limited community ambulator General Gait Details: supervision initially progressing to min guard assist with fatigue. Occasional use of handrail in hallway.   Stairs             Wheelchair Mobility    Modified Rankin (Stroke Patients Only)       Balance Overall balance assessment: Needs assistance Sitting-balance support: Feet supported;No upper extremity supported Sitting balance-[name]: Normal     Standing balance support: No upper extremity supported;During functional activity;Single extremity supported Standing balance-[name]: Good Standing balance comment: able to perform dynamic tasks related to ADLs with no UE support                            Cognition Arousal/Alertness: Awake/alert (Simultaneous filing. User may not have seen previous data.) Behavior During Therapy: [facility] for tasks assessed/performed (Simultaneous filing. User may not have seen previous data.) Overall Cognitive Status: History of cognitive impairments - at baseline (Simultaneous filing. User may not have seen previous data.)                                 General Comments: overall WFL but did note some impulsivity and decreased safety awareness based on pt impulsively standing up from EOB despite telling pt to wait until IV was positioned safely. aware of day of week but did report extraneous information throughout session unrelated to session      Exercises      General Comments        Pertinent Vitals/Pain Pain Assessment: Faces Pain Score: 8  Faces Pain Scale: Hurts little more Pain  Location: back Pain Descriptors / Indicators: Discomfort;Sore Pain Intervention(s): Monitored during session;Repositioned    Home Living                      Prior Function            PT Goals (current goals can now be found in the care plan section) Acute Rehab PT Goals Patient Stated Goal: to return home Progress towards PT goals: Progressing toward  goals    Frequency    Min 3X/week      PT Plan Current plan remains appropriate    Co-evaluation              AM-PAC PT "6 Clicks" Mobility   Outcome Measure  Help needed turning from your back to your side while in a flat bed without using bedrails?: None Help needed moving from lying on your back to sitting on the side of a flat bed without using bedrails?: None Help needed moving to and from a bed to a chair (including a wheelchair)?: None Help needed standing up from a chair using your arms (e.g., wheelchair or bedside chair)?: None Help needed to walk in hospital room?: None Help needed climbing 3-5 steps with a railing? : A Little 6 Click Score: 23    End of Session   Activity Tolerance: Patient tolerated treatment well Patient left: in bed;with call bell/phone within reach Nurse Communication: Mobility status PT Visit Diagnosis: Unsteadiness on feet (R26.81)     Time: 1027-2536 PT Time Calculation (min) (ACUTE ONLY): 11 min  Charges:  $Gait Training: 8-22 mins                     [name], PT  Office # [phone] Pager [phone]    [name] 01/15/2020, 9:03 AM

## 2020-01-15 NOTE — Progress Notes (Signed)
PROGRESS NOTE    [name]  OZH:[id number] DOB: 1966-03-30 DOA: 01/06/2020 PCP: [name], MD   Brief Narrative: 54 year old with past medical history significant for discitis (April 2021 treated with 6 weeks of vancomycin and ceftriaxone, culture unrevealing), status post laminectomy, schizophrenia and ongoing cocaine use who presented to ED complaining of shoulder pain and back pain after a mechanical fall 4 days prior to admission. -Evaluation in the ED imaging noted isolated rib fracture without pneumothorax, LS-spine showed possible progressive discitis osteomyelitis.  MRI was performed which shows possible septic arthritis at L3-4 , associated epidural phlegmon and enhancement extending from T12 -L1.  Resultant moderate to severe diffuse spinal stenosis extending from L1 -L2.improved discitis at L1-2.  -Patient was evaluated by neurosurgery, who commended medical treatment and IR guided disc space biopsy to guide antibiotics.   Assessment & Plan:   Recurrent discitis/osteomyelitis of L3-4, associated epidural phlegmon: -Previously with negative cultures, completed 6-week course of IV ceftriaxone and vancomycin in early June inpatient. -IR attempted disc aspiration and biopsy, unsuccessful due to severe disc space narrowing and overhanging degenerative osteophytes -Blood cultures are negative -Appreciate infectious disease input -Initially treated with IV ceftriaxone and vancomycin, ceftriaxone changed to cefepime 10/14, vancomycin continued -MRI of left foot cannot rule out chronic osteomyelitis, no clinical signs of infection on exam previously had hardware infection which was removed in 2019, appreciate Dr. Audrie Lia input -Pain control, continue Flexeril, lidocaine patch, oxycodone -Increase activity as tolerated  HTN;  Continue Losartan.   History of ongoing cocaine, prior ETOH use; Quit alcohol 5 years ago.  -Reports using cocaine 1-2 times a week, adderall and heroin  occasionally -Counseled  COPD; PRN nebulizer.  Nicotine patch  Schizophrenia;  On Invega Q3 months.  Continue with Cogentin, Wellbutrin, Remeron.   GERD; continue PPI.    Mild Hyponatremia; monitor. Na 134.   Anemia of chronic disease -Monitor  Acute non displace Fracture involving the anterolateral left ninth Rib;  Pain management. Incentive spirometry.   Estimated body mass index is 27.48 kg/m as calculated from the following:   Height as of this encounter: 5\' 9"  (1.753 m).   Weight as of this encounter: 84.4 kg.   DVT prophylaxis: SCDs Code Status: Full code Family Communication: Discussed with patient and girlfriend via telephone 10/13 Disposition Plan:  Status is: Inpatient  Remains inpatient appropriate because:IV treatments appropriate due to intensity of illness or inability to take PO   Dispo: The patient is from: Home              Anticipated d/c is to: to be determined              Anticipated d/c date is: after 6 weeks of IV Abx, last day of antibiotics 11/21              Patient currently is not medically stable to d/c.  Consultants:   IR  ID  Neurosurgery.   Procedures:   Disc aspiration Lumbar spine.   Antimicrobials:    Subjective: Continues to report lower back pain, discomfort but ambulating better, good oral intake  Objective: Vitals:   01/14/20 1752 01/14/20 1947 01/15/20 0625 01/15/20 0911  BP: (!) 130/94 103/79 102/83 113/78  Pulse: (!) 101 (!) 106 100 (!) 108  Resp: 18   18  Temp: 98.2 F (36.8 C) 98 F (36.7 C) 97.7 F (36.5 C) 98.2 F (36.8 C)  TempSrc:  Oral Oral   SpO2: 100% 99% 100% 100%  Weight:  Height:        Intake/Output Summary (Last 24 hours) at 01/15/2020 1310 Last data filed at 01/15/2020 1012 Gross per 24 hour  Intake 2040 ml  Output 1400 ml  Net 640 ml   Filed Weights   01/07/20 2123 01/11/20 2106 01/13/20 2110  Weight: 85.3 kg 81.6 kg 84.4 kg    Examination:  General, Pleasant male  sitting up in bed, AAOx3, no distress CVS: S1-S2, regular rate rhythm Lungs: Clear bilaterally Abdomen: Soft, nontender, bowel sounds present Extremities: No edema   Skin: No rashes on exposed skin  Data Reviewed: I have personally reviewed following labs and imaging studies  CBC: Recent Labs  Lab 01/09/20 0316 01/10/20 0115 01/12/20 0327  WBC 9.2 7.5 7.8  HGB 11.1* 10.8* 10.9*  HCT 35.1* 33.6* 34.9*  MCV 83.0 83.4 84.3  PLT 392 397 383   Basic Metabolic Panel: Recent Labs  Lab 01/09/20 0316 01/10/20 0115 01/12/20 0327 01/15/20 0358  NA 135 135 135 135  K 4.0 3.7 4.0 3.9  CL 102 102 101 104  CO2 22 24 24 24   GLUCOSE 137* 154* 98 112*  BUN 11 8 14 13   CREATININE 0.75 0.79 0.85 0.79  CALCIUM 9.2 9.0 9.0 9.2   GFR: Estimated Creatinine Clearance: 105.6 mL/min (by C-G formula based on SCr of 0.79 mg/dL). Liver Function Tests: No results for input(s): AST, ALT, ALKPHOS, BILITOT, PROT, ALBUMIN in the last 168 hours. No results for input(s): LIPASE, AMYLASE in the last 168 hours. No results for input(s): AMMONIA in the last 168 hours. Coagulation Profile: No results for input(s): INR, PROTIME in the last 168 hours. Cardiac Enzymes: No results for input(s): CKTOTAL, CKMB, CKMBINDEX, TROPONINI in the last 168 hours. BNP (last 3 results) No results for input(s): PROBNP in the last 8760 hours. HbA1C: No results for input(s): HGBA1C in the last 72 hours. CBG: No results for input(s): GLUCAP in the last 168 hours. Lipid Profile: No results for input(s): CHOL, HDL, LDLCALC, TRIG, CHOLHDL, LDLDIRECT in the last 72 hours. Thyroid Function Tests: No results for input(s): TSH, T4TOTAL, FREET4, T3FREE, THYROIDAB in the last 72 hours. Anemia Panel: No results for input(s): VITAMINB12, FOLATE, FERRITIN, TIBC, IRON, RETICCTPCT in the last 72 hours. Sepsis Labs: No results for input(s): PROCALCITON, LATICACIDVEN in the last 168 hours.  Recent Results (from the past 240 hour(s))   Respiratory Panel by RT PCR (Flu A&B, Covid) - Nasopharyngeal Swab     Status: None   Collection Time: 01/07/20  3:09 AM   Specimen: Nasopharyngeal Swab  Result Value Ref Range Status   SARS Coronavirus 2 by RT PCR NEGATIVE NEGATIVE Final    Comment: (NOTE) SARS-CoV-2 target nucleic acids are NOT DETECTED.  The SARS-CoV-2 RNA is generally detectable in upper respiratoy specimens during the acute phase of infection. The lowest concentration of SARS-CoV-2 viral copies this assay can detect is 131 copies/mL. A negative result does not preclude SARS-Cov-2 infection and should not be used as the sole basis for treatment or other patient management decisions. A negative result may occur with  improper specimen collection/handling, submission of specimen other than nasopharyngeal swab, presence of viral mutation(s) within the areas targeted by this assay, and inadequate number of viral copies (<131 copies/mL). A negative result must be combined with clinical observations, patient history, and epidemiological information. The expected result is Negative.  Fact Sheet for Patients:  https://www.moore.com/  Fact Sheet for Healthcare Providers:  https://www.young.biz/  This test is no t yet approved or  cleared by the [place] and  has been authorized for detection and/or diagnosis of SARS-CoV-2 by FDA under an Emergency Use Authorization (EUA). This EUA will remain  in effect (meaning this test can be used) for the duration of the COVID-19 declaration under Section 564(b)(1) of the Act, 21 U.S.C. section 360bbb-3(b)(1), unless the authorization is terminated or revoked sooner.     Influenza A by PCR NEGATIVE NEGATIVE Final   Influenza B by PCR NEGATIVE NEGATIVE Final    Comment: (NOTE) The Xpert Xpress SARS-CoV-2/FLU/RSV assay is intended as an aid in  the diagnosis of influenza from Nasopharyngeal swab specimens and  should not be used as  a sole basis for treatment. Nasal washings and  aspirates are unacceptable for Xpert Xpress SARS-CoV-2/FLU/RSV  testing.  Fact Sheet for Patients: https://www.moore.com/  Fact Sheet for Healthcare Providers: https://www.young.biz/  This test is not yet approved or cleared by the [place] FDA and  has been authorized for detection and/or diagnosis of SARS-CoV-2 by  FDA under an Emergency Use Authorization (EUA). This EUA will remain  in effect (meaning this test can be used) for the duration of the  Covid-19 declaration under Section 564(b)(1) of the Act, 21  U.S.C. section 360bbb-3(b)(1), unless the authorization is  terminated or revoked. Performed at [facility], [address], [place], [place] [address]   Culture, blood (routine x 2)     Status: None   Collection Time: 01/08/20  9:10 AM   Specimen: BLOOD  Result Value Ref Range Status   Specimen Description BLOOD RIGHT ANTECUBITAL  Final   Special Requests   Final    BOTTLES DRAWN AEROBIC AND ANAEROBIC Blood Culture adequate volume   Culture   Final    NO GROWTH 5 DAYS Performed at [facility], [address] [place], [place] [address]    Report Status 01/13/2020 FINAL  Final  Culture, blood (routine x 2)     Status: None   Collection Time: 01/08/20  9:13 AM   Specimen: BLOOD  Result Value Ref Range Status   Specimen Description BLOOD LEFT ANTECUBITAL  Final   Special Requests   Final    BOTTLES DRAWN AEROBIC AND ANAEROBIC Blood Culture adequate volume   Culture   Final    NO GROWTH 5 DAYS Performed at [facility], [address] [place], [place] [address]    Report Status 01/13/2020 FINAL  Final  Body fluid culture     Status: None   Collection Time: 01/08/20  3:01 PM   Specimen: Body Fluid  Result Value Ref Range Status   Specimen Description FLUID  Final   Special Requests DISC L1  Final   Gram Stain   Final    ABUNDANT WBC PRESENT,BOTH PMN AND  MONONUCLEAR NO ORGANISMS SEEN    Culture   Final    NO GROWTH 3 DAYS Performed at [facility], [address] [place], [place] [address]    Report Status 01/12/2020 FINAL  Final  Anaerobic culture     Status: None   Collection Time: 01/08/20  3:01 PM   Specimen: Fluid  Result Value Ref Range Status   Specimen Description FLUID  Final   Special Requests DISC L1  Final   Culture   Final    NO ANAEROBES ISOLATED Performed at [facility], [address], [place], [place] [address]    Report Status 01/13/2020 FINAL  Final  Scheduled Meds: . benztropine  1 mg Oral Daily  . buPROPion  300 mg Oral Daily  . cyclobenzaprine  5 mg Oral TID  . ferrous sulfate  325 mg Oral Q breakfast  . gabapentin  300 mg Oral BID  . lidocaine  1 patch Transdermal Q24H  . losartan  100 mg Oral Daily  . mirtazapine  15 mg Oral QHS  . nicotine  14 mg Transdermal Daily  . pantoprazole  40 mg Oral Daily  . senna-docusate  1 tablet Oral BID  . sodium chloride flush  10-40 mL Intracatheter Q12H  . sodium chloride flush  3 mL Intravenous Q12H  . vitamin B-12  100 mcg Oral Daily   Continuous Infusions: . sodium chloride    . ceFEPime (MAXIPIME) IV 2 g (01/15/20 0618)  . vancomycin 1,500 mg (01/15/20 1125)     LOS: 8 days   Time spent: 25 minutes.   Zannie Cove, MD [facility]  01/15/2020, 1:10 PM

## 2020-01-16 DIAGNOSIS — S2232XK Fracture of one rib, left side, subsequent encounter for fracture with nonunion: Secondary | ICD-10-CM | POA: Diagnosis not present

## 2020-01-16 DIAGNOSIS — I10 Essential (primary) hypertension: Secondary | ICD-10-CM | POA: Diagnosis not present

## 2020-01-16 DIAGNOSIS — M4626 Osteomyelitis of vertebra, lumbar region: Secondary | ICD-10-CM | POA: Diagnosis not present

## 2020-01-16 DIAGNOSIS — G894 Chronic pain syndrome: Secondary | ICD-10-CM | POA: Diagnosis not present

## 2020-01-16 MED ORDER — CYCLOBENZAPRINE HCL 5 MG PO TABS
5.0000 mg | ORAL_TABLET | Freq: Three times a day (TID) | ORAL | Status: DC | PRN
  Administered 2020-01-16 – 2020-02-18 (×17): 5 mg via ORAL
  Filled 2020-01-16 (×19): qty 1

## 2020-01-16 MED ORDER — BUPROPION HCL ER (XL) 150 MG PO TB24
150.0000 mg | ORAL_TABLET | Freq: Every day | ORAL | Status: DC
  Administered 2020-01-17 – 2020-02-20 (×35): 150 mg via ORAL
  Filled 2020-01-16 (×36): qty 1

## 2020-01-16 MED ORDER — ENOXAPARIN SODIUM 40 MG/0.4ML ~~LOC~~ SOLN
40.0000 mg | SUBCUTANEOUS | Status: DC
  Administered 2020-01-16 – 2020-02-20 (×36): 40 mg via SUBCUTANEOUS
  Filled 2020-01-16 (×36): qty 0.4

## 2020-01-16 MED ORDER — METOPROLOL SUCCINATE ER 25 MG PO TB24
25.0000 mg | ORAL_TABLET | Freq: Every day | ORAL | Status: DC
  Administered 2020-01-16 – 2020-01-22 (×7): 25 mg via ORAL
  Filled 2020-01-16 (×7): qty 1

## 2020-01-16 NOTE — Progress Notes (Signed)
PROGRESS NOTE    [name]  TGG:[id number] DOB: May 15, 1965 DOA: 01/06/2020 PCP: [name], MD   Brief Narrative: 54 year old with past medical history significant for discitis (April 2021 treated with 6 weeks of vancomycin and ceftriaxone, culture unrevealing), status post laminectomy, schizophrenia and ongoing cocaine use who presented to ED complaining of shoulder pain and back pain after a mechanical fall 4 days prior to admission. -Evaluation in the ED imaging noted isolated rib fracture without pneumothorax, LS-spine showed possible progressive discitis osteomyelitis.  MRI was performed which shows possible septic arthritis at L3-4 , associated epidural phlegmon and enhancement extending from T12 -L1.  Resultant moderate to severe diffuse spinal stenosis extending from L1 -L2.improved discitis at L1-2.  -Patient was evaluated by neurosurgery, who commended medical treatment and IR guided disc space biopsy to guide antibiotics.   Assessment & Plan:   Recurrent discitis/osteomyelitis of L3-4, associated epidural phlegmon: -Previously with negative cultures, completed 6-week course of IV ceftriaxone and vancomycin in early June inpatient. -IR attempted disc aspiration and biopsy, unsuccessful due to severe disc space narrowing and overhanging degenerative osteophytes -Blood cultures are negative -Appreciate infectious disease input -Initially treated with IV ceftriaxone and vancomycin, ceftriaxone changed to cefepime 10/14, vancomycin continued -MRI of left foot cannot rule out chronic osteomyelitis, no clinical signs of infection on exam previously had hardware infection which was removed in 2019, appreciate Dr. Audrie Lia input -Stable, continue Flexeril, lidocaine patch, oxycodone -Transition off IV morphine in few days as tolerated -Encourage activity, ambulation  HTN;  -Stable, changed losartan to low-dose Toprol while on vancomycin  History of ongoing cocaine, prior ETOH  use; Quit alcohol 5 years ago.  -Reports using cocaine 1-2 times a week, adderall and heroin occasionally -Counseled  COPD; PRN nebulizer.  Nicotine patch  Schizophrenia;  On Invega Q3 months.  Continue with Cogentin, Wellbutrin, Remeron.   GERD; continue PPI.    Mild Hyponatremia; monitor. Na 134.   Anemia of chronic disease -Monitor  Acute non displaced Fracture -left ninth Rib;  - Incentive spirometry.   Estimated body mass index is 27.48 kg/m as calculated from the following:   Height as of this encounter: 5\' 9"  (1.753 m).   Weight as of this encounter: 84.4 kg.   DVT prophylaxis: Lovenox Code Status: Full code Family Communication: Discussed with patient and girlfriend via telephone 10/13 Disposition Plan:  Status is: Inpatient  Remains inpatient appropriate because: Prolonged IV antibiotics needed  Dispo: The patient is from: Home              Anticipated d/c is to: to be determined              Anticipated d/c date is: after 6 weeks of IV Abx, last day of antibiotics 11/21              Patient currently is not medically stable to d/c.  Consultants:   IR  ID  Neurosurgery.   Procedures:   Disc aspiration Lumbar spine.   Antimicrobials:    Subjective: -Sleeping after breakfast, continues to report low back pain, current regimen helping some, denies any nausea or vomiting, denies chest pain or shortness of breath  Objective: Vitals:   01/15/20 2157 01/16/20 0310 01/16/20 0832 01/16/20 1036  BP: 120/79 (!) 117/99 99/67 120/77  Pulse: (!) 108 (!) 102 (!) 114 (!) 113  Resp:   18 18  Temp: 98.4 F (36.9 C) 97.8 F (36.6 C) 97.9 F (36.6 C) 97.6 F (36.4 C)  TempSrc: Oral  Oral Oral Oral  SpO2: 99% 100% 97% 100%  Weight:      Height:        Intake/Output Summary (Last 24 hours) at 01/16/2020 1159 Last data filed at 01/16/2020 0900 Gross per 24 hour  Intake 1440 ml  Output 650 ml  Net 790 ml   Filed Weights   01/07/20 2123 01/11/20  2106 01/13/20 2110  Weight: 85.3 kg 81.6 kg 84.4 kg    Examination:  General, Pleasant male laying in bed, somnolent but easily arousable, AAOx3, no distress CVS: S1-S2, regular rate rhythm Lungs: Decreased breath sounds bases otherwise clear Abdomen: Soft, nontender, bowel sounds present Extremities: No edema   Skin: No rashes on exposed skin  Data Reviewed: I have personally reviewed following labs and imaging studies  CBC: Recent Labs  Lab 01/10/20 0115 01/12/20 0327  WBC 7.5 7.8  HGB 10.8* 10.9*  HCT 33.6* 34.9*  MCV 83.4 84.3  PLT 397 383   Basic Metabolic Panel: Recent Labs  Lab 01/10/20 0115 01/12/20 0327 01/15/20 0358  NA 135 135 135  K 3.7 4.0 3.9  CL 102 101 104  CO2 24 24 24   GLUCOSE 154* 98 112*  BUN 8 14 13   CREATININE 0.79 0.85 0.79  CALCIUM 9.0 9.0 9.2   GFR: Estimated Creatinine Clearance: 105.6 mL/min (by C-G formula based on SCr of 0.79 mg/dL). Liver Function Tests: No results for input(s): AST, ALT, ALKPHOS, BILITOT, PROT, ALBUMIN in the last 168 hours. No results for input(s): LIPASE, AMYLASE in the last 168 hours. No results for input(s): AMMONIA in the last 168 hours. Coagulation Profile: No results for input(s): INR, PROTIME in the last 168 hours. Cardiac Enzymes: No results for input(s): CKTOTAL, CKMB, CKMBINDEX, TROPONINI in the last 168 hours. BNP (last 3 results) No results for input(s): PROBNP in the last 8760 hours. HbA1C: No results for input(s): HGBA1C in the last 72 hours. CBG: No results for input(s): GLUCAP in the last 168 hours. Lipid Profile: No results for input(s): CHOL, HDL, LDLCALC, TRIG, CHOLHDL, LDLDIRECT in the last 72 hours. Thyroid Function Tests: No results for input(s): TSH, T4TOTAL, FREET4, T3FREE, THYROIDAB in the last 72 hours. Anemia Panel: No results for input(s): VITAMINB12, FOLATE, FERRITIN, TIBC, IRON, RETICCTPCT in the last 72 hours. Sepsis Labs: No results for input(s): PROCALCITON, LATICACIDVEN  in the last 168 hours.  Recent Results (from the past 240 hour(s))  Respiratory Panel by RT PCR (Flu A&B, Covid) - Nasopharyngeal Swab     Status: None   Collection Time: 01/07/20  3:09 AM   Specimen: Nasopharyngeal Swab  Result Value Ref Range Status   SARS Coronavirus 2 by RT PCR NEGATIVE NEGATIVE Final    Comment: (NOTE) SARS-CoV-2 target nucleic acids are NOT DETECTED.  The SARS-CoV-2 RNA is generally detectable in upper respiratoy specimens during the acute phase of infection. The lowest concentration of SARS-CoV-2 viral copies this assay can detect is 131 copies/mL. A negative result does not preclude SARS-Cov-2 infection and should not be used as the sole basis for treatment or other patient management decisions. A negative result may occur with  improper specimen collection/handling, submission of specimen other than nasopharyngeal swab, presence of viral mutation(s) within the areas targeted by this assay, and inadequate number of viral copies (<131 copies/mL). A negative result must be combined with clinical observations, patient history, and epidemiological information. The expected result is Negative.  Fact Sheet for Patients:  https://www.moore.com/  Fact Sheet for Healthcare Providers:  https://www.young.biz/  This test  is no t yet approved or cleared by the [place] and  has been authorized for detection and/or diagnosis of SARS-CoV-2 by FDA under an Emergency Use Authorization (EUA). This EUA will remain  in effect (meaning this test can be used) for the duration of the COVID-19 declaration under Section 564(b)(1) of the Act, 21 U.S.C. section 360bbb-3(b)(1), unless the authorization is terminated or revoked sooner.     Influenza A by PCR NEGATIVE NEGATIVE Final   Influenza B by PCR NEGATIVE NEGATIVE Final    Comment: (NOTE) The Xpert Xpress SARS-CoV-2/FLU/RSV assay is intended as an aid in  the diagnosis of  influenza from Nasopharyngeal swab specimens and  should not be used as a sole basis for treatment. Nasal washings and  aspirates are unacceptable for Xpert Xpress SARS-CoV-2/FLU/RSV  testing.  Fact Sheet for Patients: https://www.moore.com/  Fact Sheet for Healthcare Providers: https://www.young.biz/  This test is not yet approved or cleared by the [place] FDA and  has been authorized for detection and/or diagnosis of SARS-CoV-2 by  FDA under an Emergency Use Authorization (EUA). This EUA will remain  in effect (meaning this test can be used) for the duration of the  Covid-19 declaration under Section 564(b)(1) of the Act, 21  U.S.C. section 360bbb-3(b)(1), unless the authorization is  terminated or revoked. Performed at [facility], [address], [place], [place] [address]   Culture, blood (routine x 2)     Status: None   Collection Time: 01/08/20  9:10 AM   Specimen: BLOOD  Result Value Ref Range Status   Specimen Description BLOOD RIGHT ANTECUBITAL  Final   Special Requests   Final    BOTTLES DRAWN AEROBIC AND ANAEROBIC Blood Culture adequate volume   Culture   Final    NO GROWTH 5 DAYS Performed at [facility], [address], [place], [place] [address]    Report Status 01/13/2020 FINAL  Final  Culture, blood (routine x 2)     Status: None   Collection Time: 01/08/20  9:13 AM   Specimen: BLOOD  Result Value Ref Range Status   Specimen Description BLOOD LEFT ANTECUBITAL  Final   Special Requests   Final    BOTTLES DRAWN AEROBIC AND ANAEROBIC Blood Culture adequate volume   Culture   Final    NO GROWTH 5 DAYS Performed at [facility], [address], [place], [place] [address]    Report Status 01/13/2020 FINAL  Final  Body fluid culture     Status: None   Collection Time: 01/08/20  3:01 PM   Specimen: Body Fluid  Result Value Ref Range Status   Specimen Description FLUID  Final   Special Requests  DISC L1  Final   Gram Stain   Final    ABUNDANT WBC PRESENT,BOTH PMN AND MONONUCLEAR NO ORGANISMS SEEN    Culture   Final    NO GROWTH 3 DAYS Performed at [facility], [address], [place], [place] [address]    Report Status 01/12/2020 FINAL  Final  Anaerobic culture     Status: None   Collection Time: 01/08/20  3:01 PM   Specimen: Fluid  Result Value Ref Range Status   Specimen Description FLUID  Final   Special Requests DISC L1  Final   Culture   Final    NO ANAEROBES ISOLATED Performed at [facility], [address] [place], [place] [address]    Report Status 01/13/2020  FINAL  Final     Scheduled Meds: . benztropine  1 mg Oral Daily  . [START ON 01/17/2020] buPROPion  150 mg Oral Daily  . ferrous sulfate  325 mg Oral Q breakfast  . gabapentin  300 mg Oral BID  . lidocaine  1 patch Transdermal Q24H  . metoprolol succinate  25 mg Oral Daily  . mirtazapine  15 mg Oral QHS  . nicotine  14 mg Transdermal Daily  . pantoprazole  40 mg Oral Daily  . senna-docusate  1 tablet Oral BID  . sodium chloride flush  10-40 mL Intracatheter Q12H  . sodium chloride flush  3 mL Intravenous Q12H  . vitamin B-12  100 mcg Oral Daily   Continuous Infusions: . sodium chloride 250 mL (01/16/20 0537)  . ceFEPime (MAXIPIME) IV 2 g (01/16/20 0539)  . vancomycin 1,500 mg (01/16/20 1118)     LOS: 9 days   Time spent: 25 minutes.   Zannie Cove, MD [facility]  01/16/2020, 11:59 AM

## 2020-01-16 NOTE — Progress Notes (Signed)
Pharmacy Antibiotic Note  [name] is a 54 y.o. male admitted on 01/06/2020 with prior history of lumbar osteomyelitis/discitis in May-June 2021 now with back pain post fall at home and MRI showing lumbar osteomyelitis.  Neurosurgery unable to get successful disc aspiration during biopsy. Patient with history of L ankle Enterobacter infection s/p hardware removal in 2019; ortho to see patient here, possible chronic v advancing osteo. Pharmacy has been consulted for Vancomycin and cefepime dosing to cover for Enterobacter.  Plan is for 6 wks of IV abx (vanc/cefepime) then consolidate to PO doxy and keflex for another month. Her trough and scr have been stable. We will repeat another one on 10/22.   Plan: Continue vancomycin 1500mg  IV every 12 hours Continue cefepime 2g q8h Monitor clinical status, renal function, culture results Vanc trough weekly  Height: 5\' 9"  (175.3 cm) Weight: 84.4 kg (186 lb 1.1 oz) IBW/kg (Calculated) : 70.7  Temp (24hrs), Avg:98 F (36.7 C), Min:97.6 F (36.4 C), Max:98.4 F (36.9 C)  Recent Labs  Lab 01/10/20 0115 01/12/20 0327 01/15/20 0358  WBC 7.5 7.8  --   CREATININE 0.79 0.85 0.79  VANCOTROUGH  --  22*  --     Estimated Creatinine Clearance: 105.6 mL/min (by C-G formula based on SCr of 0.79 mg/dL).    Allergies  Allergen Reactions  . Chlorhexidine Itching and Rash    Antimicrobials this admission: CTX 10/11 >> 10/14 Vanc 10/11 >> Cefepime 10/14 >>   Microbiology results: 10/11 fluid, disc L1 cx: ngtd 10/11 BC x2: ngtd 10/10 COVID, flu: neg  Onnie Boer, PharmD, BCIDP, AAHIVP, CPP Infectious Disease Pharmacist 01/16/2020 2:14 PM

## 2020-01-16 NOTE — Plan of Care (Signed)
  Problem: Education: Goal: Knowledge of General Education information will improve Description: Including pain rating scale, medication(s)/side effects and non-pharmacologic comfort measures Outcome: Progressing   Problem: Pain Managment: Goal: General experience of comfort will improve Outcome: Progressing   

## 2020-01-16 NOTE — Plan of Care (Signed)
  Problem: Activity: Goal: Risk for activity intolerance will decrease Outcome: Progressing   

## 2020-01-17 DIAGNOSIS — M4626 Osteomyelitis of vertebra, lumbar region: Secondary | ICD-10-CM | POA: Diagnosis not present

## 2020-01-17 LAB — BASIC METABOLIC PANEL
Anion gap: 9 (ref 5–15)
BUN: 13 mg/dL (ref 6–20)
CO2: 22 mmol/L (ref 22–32)
Calcium: 9.2 mg/dL (ref 8.9–10.3)
Chloride: 106 mmol/L (ref 98–111)
Creatinine, Ser: 1 mg/dL (ref 0.61–1.24)
GFR, Estimated: >60 mL/min (ref >60)
Glucose, Bld: 110 mg/dL — ABNORMAL HIGH (ref 70–99)
Potassium: 4.1 mmol/L (ref 3.5–5.1)
Sodium: 137 mmol/L (ref 135–145)

## 2020-01-17 LAB — CBC
HCT: 34.4 % — ABNORMAL LOW (ref 39.0–52.0)
Hemoglobin: 10.6 g/dL — ABNORMAL LOW (ref 13.0–17.0)
MCH: 25.9 pg — ABNORMAL LOW (ref 26.0–34.0)
MCHC: 30.8 g/dL (ref 30.0–36.0)
MCV: 84.1 fL (ref 80.0–100.0)
Platelets: 405 10*3/uL — ABNORMAL HIGH (ref 150–400)
RBC: 4.09 MIL/uL — ABNORMAL LOW (ref 4.22–5.81)
RDW: 15.8 % — ABNORMAL HIGH (ref 11.5–15.5)
WBC: 6.6 10*3/uL (ref 4.0–10.5)
nRBC: 0 % (ref 0.0–0.2)

## 2020-01-17 LAB — TSH: TSH: 3.68 u[IU]/mL (ref 0.350–4.500)

## 2020-01-17 MED ORDER — MORPHINE SULFATE (PF) 2 MG/ML IV SOLN
2.0000 mg | Freq: Three times a day (TID) | INTRAVENOUS | Status: DC | PRN
  Administered 2020-01-17 – 2020-01-19 (×5): 2 mg via INTRAVENOUS
  Filled 2020-01-17 (×6): qty 1

## 2020-01-17 NOTE — Progress Notes (Signed)
PROGRESS NOTE    [name]  LOV:[id number] DOB: [birth year] DOA: 01/06/2020 PCP: [name], MD    Brief Narrative:  54 year old with past medical history significant for discitis on 4/21 treated with 6 weeks of vancomycin and ceftriaxone culture was unrevealing, status post laminectomy, schizophrenia and ongoing cocaine use presented to the ER with shoulder pain and back pain after a mechanical fall 4 days prior to admission.  In the emergency room she was noted to have isolated rib fracture without pneumothorax, lumbosacral spine showed possible progressive discitis and osteomyelitis.  MRI showed septic arthritis L3/4, associated epidural phlegmon and enhancement extending from T12-L1.  Patient was evaluated neurosurgery recommended medical treatment and IR guided disc space biopsy to guide antibiotic therapy and hence admitted to the hospital.   Assessment & Plan:   Principal Problem:   Acute osteomyelitis of lumbar spine (HCC) Active Problems:   Cocaine abuse (HCC)   Benign essential HTN   History of CVA (cerebrovascular accident)   Chronic pain syndrome   Closed fracture of one rib of left side   Discitis of lumbar region   Ankle ankylosis, left  Recurrent discitis/osteomyelitis of L3 and 4 vertebrae with associated epidural phlegmon: Previously negative cultures.  Previously treated with 6 weeks of IV ceftriaxone and vancomycin. Unsuccessful attempted disc aspiration and biopsy by IR due to severe disc space narrowing and degenerative osteophytes. Blood cultures are negative so far. Also had changes on left foot that was equivocal for chronic osteomyelitis. As per ID recommendation, patient remains on cefepime and vancomycin as empiric treatment. Remains on morphine and oxycodone for pain relief, will gradually transition off morphine to oral pain medications and muscle relaxants. Decrease the frequency of morphine today, will stop in the next 24 to 48 hours. Continue  to mobilize.  Hypertension: Blood pressure stable.  Currently remains on Toprol.  COPD: Stable on as needed nebulizers.  She is a smoker and currently on nicotine patch.  Schizophrenia: Patient takes [place] every 3 months.  Currently on Cogentin Wellbutrin and Remeron that is continued.  Uncomplicated rib fracture: Symptomatic treatment.   DVT prophylaxis: enoxaparin (LOVENOX) injection 40 mg Start: 01/16/20 1300 SCDs Start: 01/07/20 1025   Code Status: Full code Family Communication: None Disposition Plan: Status is: Inpatient, patient apparently cannot be discharged with indwelling vascular access so remains in the hospital until 11/21 to complete his antibiotic courses.  Remains inpatient appropriate because:Unsafe d/c plan and Inpatient level of care appropriate due to severity of illness   Dispo: The patient is from: Home              Anticipated d/c is to: Home              Anticipated d/c date is: > 3 days              Patient currently is not medically stable to d/c.         Consultants:   Infectious disease  IR  Neurosurgery  Procedures:   Disc aspiration and biopsy attempted, unsuccessful  Antimicrobials:  Anti-infectives (From admission, onward)   Start     Dose/Rate Route Frequency Ordered Stop   01/11/20 1400  ceFEPIme (MAXIPIME) 2 g in sodium chloride 0.9 % 100 mL IVPB        2 g 200 mL/hr over 30 Minutes Intravenous Every 8 hours 01/11/20 1013     01/09/20 0400  vancomycin (VANCOREADY) IVPB 1500 mg/300 mL        1,500 mg  150 mL/hr over 120 Minutes Intravenous Every 12 hours 01/08/20 1900     01/08/20 1900  vancomycin (VANCOREADY) IVPB 1750 mg/350 mL        1,750 mg 175 mL/hr over 120 Minutes Intravenous  Once 01/08/20 1900 01/08/20 2240   01/08/20 1818  cefTRIAXone (ROCEPHIN) 2 g in sodium chloride 0.9 % 100 mL IVPB  Status:  Discontinued        2 g 200 mL/hr over 30 Minutes Intravenous Every 24 hours 01/08/20 1813 01/11/20 1011        Subjective: Patient seen and examined.  No overnight events.  He was moving around the room with some pain on his lower back.  Patient denies any nausea vomiting.  Afebrile.  He realizes that he has 1 more month in the hospital to stay.  Objective: Vitals:   01/16/20 1658 01/16/20 2054 01/17/20 0528 01/17/20 0825  BP: 109/74 119/79 112/77 120/74  Pulse: (!) 101 93 97 96  Resp: 18 18 19 18   Temp: 97.9 F (36.6 C) 98.2 F (36.8 C) 97.7 F (36.5 C) 98.5 F (36.9 C)  TempSrc: Oral Oral Oral Oral  SpO2: 100% 100% 98% 100%  Weight:  83.7 kg    Height:        Intake/Output Summary (Last 24 hours) at 01/17/2020 0834 Last data filed at 01/17/2020 0600 Gross per 24 hour  Intake 2130 ml  Output 0 ml  Net 2130 ml   Filed Weights   01/11/20 2106 01/13/20 2110 01/16/20 2054  Weight: 81.6 kg 84.4 kg 83.7 kg    Examination:  General exam: Appears calm and comfortable, on room air.  Looks comfortable.  Has mild pain on walking but not distressed. Respiratory system: Clear to auscultation. Respiratory effort normal.  No added sound. Cardiovascular system: S1 & S2 heard, RRR.  Patient has a midline on the left arm. Gastrointestinal system: Abdomen is nondistended, soft and nontender. No organomegaly or masses felt. Normal bowel sounds heard. Central nervous system: Alert and oriented. No focal neurological deficits. Extremities: Symmetric 5 x 5 power. Skin: No rashes, lesions or ulcers Psychiatry: Judgement and insight appear normal. Mood & affect appropriate.  Moderate tenderness on lower paraspinal muscles with no collection or deformity.   Data Reviewed: I have personally reviewed following labs and imaging studies  CBC: Recent Labs  Lab 01/12/20 0327 01/17/20 0817  WBC 7.8 6.6  HGB 10.9* 10.6*  HCT 34.9* 34.4*  MCV 84.3 84.1  PLT 383 405*   Basic Metabolic Panel: Recent Labs  Lab 01/12/20 0327 01/15/20 0358  NA 135 135  K 4.0 3.9  CL 101 104  CO2 24 24   GLUCOSE 98 112*  BUN 14 13  CREATININE 0.85 0.79  CALCIUM 9.0 9.2   GFR: Estimated Creatinine Clearance: 105.6 mL/min (by C-G formula based on SCr of 0.79 mg/dL). Liver Function Tests: No results for input(s): AST, ALT, ALKPHOS, BILITOT, PROT, ALBUMIN in the last 168 hours. No results for input(s): LIPASE, AMYLASE in the last 168 hours. No results for input(s): AMMONIA in the last 168 hours. Coagulation Profile: No results for input(s): INR, PROTIME in the last 168 hours. Cardiac Enzymes: No results for input(s): CKTOTAL, CKMB, CKMBINDEX, TROPONINI in the last 168 hours. BNP (last 3 results) No results for input(s): PROBNP in the last 8760 hours. HbA1C: No results for input(s): HGBA1C in the last 72 hours. CBG: No results for input(s): GLUCAP in the last 168 hours. Lipid Profile: No results for input(s): CHOL, HDL,  LDLCALC, TRIG, CHOLHDL, LDLDIRECT in the last 72 hours. Thyroid Function Tests: No results for input(s): TSH, T4TOTAL, FREET4, T3FREE, THYROIDAB in the last 72 hours. Anemia Panel: No results for input(s): VITAMINB12, FOLATE, FERRITIN, TIBC, IRON, RETICCTPCT in the last 72 hours. Sepsis Labs: No results for input(s): PROCALCITON, LATICACIDVEN in the last 168 hours.  Recent Results (from the past 240 hour(s))  Culture, blood (routine x 2)     Status: None   Collection Time: 01/08/20  9:10 AM   Specimen: BLOOD  Result Value Ref Range Status   Specimen Description BLOOD RIGHT ANTECUBITAL  Final   Special Requests   Final    BOTTLES DRAWN AEROBIC AND ANAEROBIC Blood Culture adequate volume   Culture   Final    NO GROWTH 5 DAYS Performed at [facility], [address] [place], [place] [address]    Report Status 01/13/2020 FINAL  Final  Culture, blood (routine x 2)     Status: None   Collection Time: 01/08/20  9:13 AM   Specimen: BLOOD  Result Value Ref Range Status   Specimen Description BLOOD LEFT ANTECUBITAL  Final   Special Requests   Final     BOTTLES DRAWN AEROBIC AND ANAEROBIC Blood Culture adequate volume   Culture   Final    NO GROWTH 5 DAYS Performed at [facility], [address] [place], [place] [address]    Report Status 01/13/2020 FINAL  Final  Body fluid culture     Status: None   Collection Time: 01/08/20  3:01 PM   Specimen: Body Fluid  Result Value Ref Range Status   Specimen Description FLUID  Final   Special Requests DISC L1  Final   Gram Stain   Final    ABUNDANT WBC PRESENT,BOTH PMN AND MONONUCLEAR NO ORGANISMS SEEN    Culture   Final    NO GROWTH 3 DAYS Performed at [facility], [address], [place], [place] [address]    Report Status 01/12/2020 FINAL  Final  Anaerobic culture     Status: None   Collection Time: 01/08/20  3:01 PM   Specimen: Fluid  Result Value Ref Range Status   Specimen Description FLUID  Final   Special Requests DISC L1  Final   Culture   Final    NO ANAEROBES ISOLATED Performed at [facility], [address] [place], [place] [address]    Report Status 01/13/2020 FINAL  Final         Radiology Studies: No results found.      Scheduled Meds: . benztropine  1 mg Oral Daily  . buPROPion  150 mg Oral Daily  . enoxaparin (LOVENOX) injection  40 mg Subcutaneous Q24H  . ferrous sulfate  325 mg Oral Q breakfast  . gabapentin  300 mg Oral BID  . lidocaine  1 patch Transdermal Q24H  . metoprolol succinate  25 mg Oral Daily  . mirtazapine  15 mg Oral QHS  . nicotine  14 mg Transdermal Daily  . pantoprazole  40 mg Oral Daily  . senna-docusate  1 tablet Oral BID  . sodium chloride flush  10-40 mL Intracatheter Q12H  . sodium chloride flush  3 mL Intravenous Q12H  . vitamin B-12  100 mcg Oral Daily   Continuous Infusions: . sodium chloride 250 mL (01/16/20 0537)  . ceFEPime (MAXIPIME) IV 2 g (01/17/20 0543)  . vancomycin 1,500 mg (01/16/20 2227)     LOS: 10  days    Time spent: 25 minutes    Dorcas Carrow, MD [facility] Pager [phone]

## 2020-01-17 NOTE — Progress Notes (Signed)
Physical Therapy Treatment Patient Details Name: [name] MRN: [id number] DOB: 28-Nov-1965 Today's Date: 01/17/2020    History of Present Illness  Pt is a 54 y/o male with PMH of discitis April 2021, s/p laminectormy, schizophrenia, cocaine use, presenting with mechanical fall in the shower with shoulder and back pain. Imaging reveals: progresive discitits osteomyelitis of LS spine, Septic arthriits at L3-4, spinal stenosis L1-2 and improved discitis at L1-2.  Underwent apsiration/core biopsy 10/11, neurosurgery recommends medical tx. Acute non displaced Fx of anterolateral L 9th rib; R shoulder with no acute findings  .      PT Comments    pt fully participated in session for ambulation; pt requiring assist with ambulation with fatigue; pt state he does use a rollator at home for safety; pt demonstrates deficits in balance and overall endurance during ambulation; pt will continue to benefit from skilled PT to address deficits to maximize independence with functional mobility prior to discharge    Follow Up Recommendations  No PT follow up     Equipment Recommendations  None recommended by PT    Recommendations for Other Services       Precautions / Restrictions  Precautions Precautions: Fall Precaution Comments: low fall Restrictions Weight Bearing Restrictions: No    Mobility  Bed Mobility Overal bed mobility: Independent                Transfers Overall transfer level: Modified independent Equipment used: None Transfers: Sit to/from Stand Sit to Stand: Modified independent (Device/Increase time) Stand pivot transfers: Supervision       General transfer comment: S with no AD  Ambulation/Gait Ambulation/Gait assistance: Supervision;Min guard Gait Distance (Feet): 600 Feet         General Gait Details: supervision initially progressing to min guard assist with fatigue. increased use of handrail in hallway. pt with decreased velocity and increased  lateral weight shift with ambulation   Stairs             Wheelchair Mobility    Modified Rankin (Stroke Patients Only)       Balance                                            Cognition                                              Exercises      General Comments        Pertinent Vitals/Pain      Home Living                      Prior Function            PT Goals (current goals can now be found in the care plan section) Acute Rehab PT Goals Patient Stated Goal: to return home PT Goal Formulation: With patient Time For Goal Achievement: 01/23/20 Potential to Achieve Goals: Good Progress towards PT goals: Progressing toward goals    Frequency    Min 3X/week      PT Plan Current plan remains appropriate    Co-evaluation              AM-PAC PT "6 Clicks" Mobility   Outcome Measure  Help needed  turning from your back to your side while in a flat bed without using bedrails?: None Help needed moving from lying on your back to sitting on the side of a flat bed without using bedrails?: None Help needed moving to and from a bed to a chair (including a wheelchair)?: None Help needed standing up from a chair using your arms (e.g., wheelchair or bedside chair)?: None Help needed to walk in hospital room?: A Little Help needed climbing 3-5 steps with a railing? : A Little 6 Click Score: 22    End of Session Equipment Utilized During Treatment: Gait belt Activity Tolerance: Patient tolerated treatment well Patient left: with call bell/phone within reach;in bed Nurse Communication: Mobility status PT Visit Diagnosis: Unsteadiness on feet (R26.81)     Time: 1610-9604 PT Time Calculation (min) (ACUTE ONLY): 10 min  Charges:  $Gait Training: 8-22 mins                     Ginette Otto, DPT Acute Rehabilitation Services [phone]   Lucretia Field 01/17/2020, 12:37 PM

## 2020-01-17 NOTE — Progress Notes (Signed)
Occupational Therapy Treatment Patient Details Name: [name] MRN: [id number] DOB: [birth year] Today's Date: 01/17/2020    History of present illness Pt is a 54 y/o male with PMH of discitis April 2021, s/p laminectormy, schizophrenia, cocaine use, presenting with mechanical fall in the shower with shoulder and back pain. Imaging reveals: progresive discitits osteomyelitis of LS spine, Septic arthriits at L3-4, spinal stenosis L1-2 and improved discitis at L1-2.  Underwent apsiration/core biopsy 10/11, neurosurgery recommends medical tx. Acute non displaced Fx of anterolateral L 9th rib; R shoulder with no acute findings.    OT comments  Treatment focused on safety with ambulation. Patient independent for bed mobility, supervision for in room ambulation and min guard to ambulate in hall pushing IV pole. Therapist provided education to patient in regards to safety with ambulation to reduce fall risk - focusing on IV pole/line management, not increasing speed when fatigued, and limiting distance with fatigue. Patient verbalized understanding. However, patient known to therapist and has a poor memory and will need reiteration.    Follow Up Recommendations  No OT follow up;Supervision - Intermittent    Equipment Recommendations       Recommendations for Other Services      Precautions / Restrictions Precautions Precautions: Fall Restrictions Weight Bearing Restrictions: No       Mobility Bed Mobility Overal bed mobility: Independent             General bed mobility comments: independent for bed mobility, knows how to log roll out of bed  Transfers Overall transfer level: Needs assistance Equipment used: None Transfers: Sit to/from Stand Sit to Stand: Modified independent (Device/Increase time) Stand pivot transfers: Supervision       General transfer comment: Supervision to ambulate in room due to patient having furniture to reach out and hold. min guard  to ambulate  in hall pushing IV pole.    Balance Overall balance assessment: Needs assistance Sitting-balance support: Feet supported;No upper extremity supported Sitting balance-[name]: Normal     Standing balance support: No upper extremity supported;During functional activity;Single extremity supported Standing balance-[name]: Good                             ADL either performed or assessed with clinical judgement   ADL                                               Vision   Vision Assessment?: No apparent visual deficits   Perception     Praxis      Cognition   Behavior During Therapy: WFL for tasks assessed/performed Overall Cognitive Status: History of cognitive impairments - at baseline                                 General Comments: Decreased safety awareness        Exercises     Shoulder Instructions       General Comments      Pertinent Vitals/ Pain       Pain Assessment: 0-10 Pain Score: 8  Pain Location: back Pain Descriptors / Indicators: Discomfort;Sore Pain Intervention(s): Monitored during session;RN gave pain meds during session  Home Living  Prior Functioning/Environment              Frequency  Min 2X/week        Progress Toward Goals  OT Goals(current goals can now be found in the care plan section)  Progress towards OT goals: Progressing toward goals  Acute Rehab OT Goals Patient Stated Goal: to return home OT Goal Formulation: With patient Time For Goal Achievement: 01/24/20 Potential to Achieve Goals: Good  Plan Discharge plan remains appropriate;Frequency remains appropriate    Co-evaluation                 AM-PAC OT "6 Clicks" Daily Activity     Outcome Measure   Help from another person eating meals?: None Help from another person taking care of personal grooming?: None Help from another person  toileting, which includes using toliet, bedpan, or urinal?: A Little Help from another person bathing (including washing, rinsing, drying)?: A Little Help from another person to put on and taking off regular upper body clothing?: None Help from another person to put on and taking off regular lower body clothing?: None 6 Click Score: 22    End of Session    OT Visit Diagnosis: Other abnormalities of gait and mobility (R26.89);Pain Pain - Right/Left: Right Pain - part of body:  (back)   Activity Tolerance Patient tolerated treatment well   Patient Left in bed;with call bell/phone within reach;Other (comment)   Nurse Communication Mobility status        Time: 8295-6213 OT Time Calculation (min): 16 min  Charges: OT General Charges $OT Visit: 1 Visit OT Treatments $Therapeutic Activity: 8-22 mins  Waldron Session, OTR/L Acute Care Rehab Services  Office [phone] Pager: [phone]    Kelli Churn 01/17/2020, 5:03 PM

## 2020-01-18 DIAGNOSIS — M4626 Osteomyelitis of vertebra, lumbar region: Secondary | ICD-10-CM | POA: Diagnosis not present

## 2020-01-18 LAB — VANCOMYCIN, TROUGH: Vancomycin Tr: 23 ug/mL (ref 15–20)

## 2020-01-18 MED ORDER — VANCOMYCIN HCL 1250 MG/250ML IV SOLN
1250.0000 mg | Freq: Two times a day (BID) | INTRAVENOUS | Status: DC
  Administered 2020-01-18 – 2020-01-22 (×8): 1250 mg via INTRAVENOUS
  Filled 2020-01-18 (×9): qty 250

## 2020-01-18 MED ORDER — SODIUM CHLORIDE 0.9% FLUSH
10.0000 mL | INTRAVENOUS | Status: DC | PRN
  Administered 2020-01-20: 10 mL

## 2020-01-18 MED ORDER — SODIUM CHLORIDE 0.9% FLUSH: 10.0000 mL | Freq: Two times a day (BID) | INTRAVENOUS | Status: DC

## 2020-01-18 NOTE — Progress Notes (Addendum)
Pharmacy Antibiotic Note  [name] is a 54 y.o. male admitted on 01/06/2020 with prior history of lumbar osteomyelitis/discitis in May-June 2021 now with back pain post fall at home and MRI showing lumbar osteomyelitis.  Neurosurgery unable to get successful disc aspiration during biopsy. Patient with history of L ankle Enterobacter infection s/p hardware removal in 2019; ortho to see patient here, possible chronic v advancing osteo. Pharmacy has been consulted for Vancomycin and cefepime dosing to cover for Enterobacter.  Plan is for 6 wks of IV abx (vanc/cefepime) then consolidate to PO doxy and keflex for another month.  SCr within normal and stable.   Vanc trough today (10/21) is 23 mcg/ml , drawn ~50 min early on 1500 mg q12h>will decrease to 1250 mg q12h to target goal trough 15-20 mcg/ml  Plan: Decrease Vancomycin to 1250 mg IV every 12 hours Continue Cefepime 2g q8h Monitor clinical status, renal function, culture results Monitor Vanc trough  per protocol, at least once weekly, and monitor serum creatinine weekly.   Height: 5\' 9"  (175.3 cm) Weight: 87.6 kg (193 lb 2 oz) IBW/kg (Calculated) : 70.7  Temp (24hrs), Avg:97.9 F (36.6 C), Min:97.4 F (36.3 C), Max:98.5 F (36.9 C)  Recent Labs  Lab 01/12/20 0327 01/15/20 0358 01/17/20 0817 01/18/20 0949  WBC 7.8  --  6.6  --   CREATININE 0.85 0.79 1.00  --   VANCOTROUGH 22*  --   --  23*    Estimated Creatinine Clearance: 92.6 mL/min (by C-G formula based on SCr of 1 mg/dL).    Allergies  Allergen Reactions  . Chlorhexidine Itching and Rash    Antimicrobials this admission: CTX 10/11 >> 10/14 Vanc 10/11 >> Cefepime 10/14 >>   Dose adjustments / vancomycin trough levels: 10/15 VT =22 mcg/ml but dose prior PM dose was given ~4 hours late so true trough ~15 mcg/ml (therapeutic) on 1500mg  q12h> continue current dose.  10/21 VT = 23 , drawn ~50 min early , 1.5g q12h>will decrease to 1250 mg q12h   Microbiology  results: 10/11 fluid, disc L1 cx: negative 10/11 BC x2: negative 10/10 COVID, flu: neg   Nicole Cella, RPh Clinical Pharmacist [phone]  Please check AMION for all [facility] phone numbers After 10:00 PM, call [place] [phone] 01/18/2020 11:27 AM

## 2020-01-18 NOTE — Progress Notes (Signed)
PROGRESS NOTE    [name]  OZH:[id number] DOB: 02-Dec-1965 DOA: 01/06/2020 PCP: [name], MD    Brief Narrative:  54 year old with past medical history significant for discitis on 4/21 treated with 6 weeks of vancomycin and ceftriaxone culture was unrevealing, status post laminectomy, schizophrenia and ongoing cocaine use presented to the ER with shoulder pain and back pain after a mechanical fall 4 days prior to admission.  In the emergency room she was noted to have isolated rib fracture without pneumothorax, lumbosacral spine showed possible progressive discitis and osteomyelitis.  MRI showed septic arthritis L3/4, associated epidural phlegmon and enhancement extending from T12-L1.  Patient was evaluated neurosurgery recommended medical treatment and IR guided disc space biopsy to guide antibiotic therapy and hence admitted to the hospital.   Assessment & Plan:   Principal Problem:   Acute osteomyelitis of lumbar spine (HCC) Active Problems:   Cocaine abuse (HCC)   Benign essential HTN   History of CVA (cerebrovascular accident)   Chronic pain syndrome   Closed fracture of one rib of left side   Discitis of lumbar region   Ankle ankylosis, left  Recurrent discitis/osteomyelitis of L3 and 4 vertebrae with associated epidural phlegmon: Previously negative cultures.  Previously treated with 6 weeks of IV ceftriaxone and vancomycin. Unsuccessful attempted disc aspiration and biopsy by IR due to severe disc space narrowing and degenerative osteophytes. Blood cultures are negative so far. Also had changes on left foot that was equivocal for chronic osteomyelitis. As per ID recommendation, patient remains on cefepime and vancomycin as empiric treatment. Remains on morphine and oxycodone for pain relief, will gradually transition off morphine to oral pain medications and muscle relaxants. Decrease the frequency of morphine today, will stop in the next 24 hours. Continue to  mobilize.  Hypertension: Blood pressure stable.  Currently remains on Toprol.  COPD: Stable on as needed nebulizers.  She is a smoker and currently on nicotine patch.  Schizophrenia: Patient takes [place] every 3 months.  Currently on Cogentin Wellbutrin and Remeron that is continued.  Uncomplicated rib fracture: Symptomatic treatment.   DVT prophylaxis: enoxaparin (LOVENOX) injection 40 mg Start: 01/16/20 1300 SCDs Start: 01/07/20 1025   Code Status: Full code Family Communication: None Disposition Plan: Status is: Inpatient, patient apparently cannot be discharged with indwelling vascular access so remains in the hospital until 11/21 to complete his antibiotic courses.  Remains inpatient appropriate because:Unsafe d/c plan and Inpatient level of care appropriate due to severity of illness   Dispo: The patient is from: Home              Anticipated d/c is to: Home              Anticipated d/c date is: > 3 days              Patient currently is not medically stable to d/c.         Consultants:   Infectious disease  IR  Neurosurgery  Procedures:   Disc aspiration and biopsy attempted, unsuccessful  Antimicrobials:  Anti-infectives (From admission, onward)   Start     Dose/Rate Route Frequency Ordered Stop   01/18/20 1200  vancomycin (VANCOREADY) IVPB 1250 mg/250 mL        1,250 mg 166.7 mL/hr over 90 Minutes Intravenous Every 12 hours 01/18/20 1125     01/11/20 1400  ceFEPIme (MAXIPIME) 2 g in sodium chloride 0.9 % 100 mL IVPB        2 g 200 mL/hr  over 30 Minutes Intravenous Every 8 hours 01/11/20 1013     01/09/20 0400  vancomycin (VANCOREADY) IVPB 1500 mg/300 mL  Status:  Discontinued        1,500 mg 150 mL/hr over 120 Minutes Intravenous Every 12 hours 01/08/20 1900 01/18/20 1125   01/08/20 1900  vancomycin (VANCOREADY) IVPB 1750 mg/350 mL        1,750 mg 175 mL/hr over 120 Minutes Intravenous  Once 01/08/20 1900 01/08/20 2240   01/08/20 1818  cefTRIAXone  (ROCEPHIN) 2 g in sodium chloride 0.9 % 100 mL IVPB  Status:  Discontinued        2 g 200 mL/hr over 30 Minutes Intravenous Every 24 hours 01/08/20 1813 01/11/20 1011       Subjective: No new events.  Walking around in the hallway with independent.  Afebrile.  Still has some pain.  Used injectable morphine three times in last 24 hours. Objective: Vitals:   01/17/20 1631 01/17/20 2006 01/18/20 0512 01/18/20 0919  BP: 121/78 122/89 (!) 143/110 123/86  Pulse: 88 97 (!) 105 (!) 101  Resp: 18 18 16 18   Temp: (!) 97.4 F (36.3 C) 98.5 F (36.9 C) (!) 97.4 F (36.3 C) 98.2 F (36.8 C)  TempSrc: Oral Oral Oral   SpO2: 100% 99% 100% 97%  Weight:  87.6 kg    Height:        Intake/Output Summary (Last 24 hours) at 01/18/2020 1155 Last data filed at 01/18/2020 1027 Gross per 24 hour  Intake 2160 ml  Output 3825 ml  Net -1665 ml   Filed Weights   01/13/20 2110 01/16/20 2054 01/17/20 2006  Weight: 84.4 kg 83.7 kg 87.6 kg    Examination:  He looks comfortable.  He is on room air. Occasionally seen walking around. There is some tenderness on the paraspinous muscles on the left side.  Data Reviewed: I have personally reviewed following labs and imaging studies  CBC: Recent Labs  Lab 01/12/20 0327 01/17/20 0817  WBC 7.8 6.6  HGB 10.9* 10.6*  HCT 34.9* 34.4*  MCV 84.3 84.1  PLT 383 405*   Basic Metabolic Panel: Recent Labs  Lab 01/12/20 0327 01/15/20 0358 01/17/20 0817  NA 135 135 137  K 4.0 3.9 4.1  CL 101 104 106  CO2 24 24 22   GLUCOSE 98 112* 110*  BUN 14 13 13   CREATININE 0.85 0.79 1.00  CALCIUM 9.0 9.2 9.2   GFR: Estimated Creatinine Clearance: 92.6 mL/min (by C-G formula based on SCr of 1 mg/dL). Liver Function Tests: No results for input(s): AST, ALT, ALKPHOS, BILITOT, PROT, ALBUMIN in the last 168 hours. No results for input(s): LIPASE, AMYLASE in the last 168 hours. No results for input(s): AMMONIA in the last 168 hours. Coagulation Profile: No  results for input(s): INR, PROTIME in the last 168 hours. Cardiac Enzymes: No results for input(s): CKTOTAL, CKMB, CKMBINDEX, TROPONINI in the last 168 hours. BNP (last 3 results) No results for input(s): PROBNP in the last 8760 hours. HbA1C: No results for input(s): HGBA1C in the last 72 hours. CBG: No results for input(s): GLUCAP in the last 168 hours. Lipid Profile: No results for input(s): CHOL, HDL, LDLCALC, TRIG, CHOLHDL, LDLDIRECT in the last 72 hours. Thyroid Function Tests: Recent Labs    01/17/20 0817  TSH 3.680   Anemia Panel: No results for input(s): VITAMINB12, FOLATE, FERRITIN, TIBC, IRON, RETICCTPCT in the last 72 hours. Sepsis Labs: No results for input(s): PROCALCITON, LATICACIDVEN in the last 168 hours.  Recent Results (from the past 240 hour(s))  Body fluid culture     Status: None   Collection Time: 01/08/20  3:01 PM   Specimen: Body Fluid  Result Value Ref Range Status   Specimen Description FLUID  Final   Special Requests DISC L1  Final   Gram Stain   Final    ABUNDANT WBC PRESENT,BOTH PMN AND MONONUCLEAR NO ORGANISMS SEEN    Culture   Final    NO GROWTH 3 DAYS Performed at [facility], [address] [place], [place] [address]    Report Status 01/12/2020 FINAL  Final  Anaerobic culture     Status: None   Collection Time: 01/08/20  3:01 PM   Specimen: Fluid  Result Value Ref Range Status   Specimen Description FLUID  Final   Special Requests DISC L1  Final   Culture   Final    NO ANAEROBES ISOLATED Performed at [facility], [address], [place], [place] [address]    Report Status 01/13/2020 FINAL  Final         Radiology Studies: No results found.      Scheduled Meds: . benztropine  1 mg Oral Daily  . buPROPion  150 mg Oral Daily  . enoxaparin (LOVENOX) injection  40 mg Subcutaneous Q24H  . ferrous sulfate  325 mg Oral Q breakfast  . gabapentin  300 mg Oral BID  . lidocaine  1 patch Transdermal Q24H  .  metoprolol succinate  25 mg Oral Daily  . mirtazapine  15 mg Oral QHS  . nicotine  14 mg Transdermal Daily  . pantoprazole  40 mg Oral Daily  . senna-docusate  1 tablet Oral BID  . sodium chloride flush  10-40 mL Intracatheter Q12H  . sodium chloride flush  3 mL Intravenous Q12H  . vitamin B-12  100 mcg Oral Daily   Continuous Infusions: . sodium chloride 250 mL (01/16/20 0537)  . ceFEPime (MAXIPIME) IV 2 g (01/18/20 0551)  . vancomycin       LOS: 11 days    Time spent: 15 minutes    Dorcas Carrow, MD [facility] Pager [phone]

## 2020-01-19 ENCOUNTER — Ambulatory Visit: Payer: Medicare Other | Admitting: Neurology

## 2020-01-19 DIAGNOSIS — M4626 Osteomyelitis of vertebra, lumbar region: Secondary | ICD-10-CM | POA: Diagnosis not present

## 2020-01-19 MED ORDER — MORPHINE SULFATE (PF) 2 MG/ML IV SOLN
2.0000 mg | Freq: Two times a day (BID) | INTRAVENOUS | Status: DC | PRN
  Administered 2020-01-19 – 2020-01-20 (×2): 2 mg via INTRAVENOUS
  Filled 2020-01-19 (×2): qty 1

## 2020-01-19 NOTE — Progress Notes (Signed)
PROGRESS NOTE    [name]  EPP:[id number] DOB: Jun 07, 1965 DOA: 01/06/2020 PCP: [name], MD    Brief Narrative:  54 year old with past medical history significant for discitis on 4/21 treated with 6 weeks of vancomycin and ceftriaxone culture was unrevealing, status post laminectomy, schizophrenia and ongoing cocaine use presented to the ER with shoulder pain and back pain after a mechanical fall 4 days prior to admission.  In the emergency room she was noted to have isolated rib fracture without pneumothorax, lumbosacral spine showed possible progressive discitis and osteomyelitis.  MRI showed septic arthritis L3/4, associated epidural phlegmon and enhancement extending from T12-L1.  Patient was evaluated neurosurgery recommended medical treatment and IR guided disc space biopsy to guide antibiotic therapy and hence admitted to the hospital.   Assessment & Plan:   Principal Problem:   Acute osteomyelitis of lumbar spine (HCC) Active Problems:   Cocaine abuse (HCC)   Benign essential HTN   History of CVA (cerebrovascular accident)   Chronic pain syndrome   Closed fracture of one rib of left side   Discitis of lumbar region   Ankle ankylosis, left  Recurrent discitis/osteomyelitis of L3 and 4 vertebrae with associated epidural phlegmon: Previously negative cultures.  Previously treated with 6 weeks of IV ceftriaxone and vancomycin. Unsuccessful attempted disc aspiration and biopsy by IR due to severe disc space narrowing and degenerative osteophytes. Blood cultures are negative so far. Also had changes on left foot that was equivocal for chronic osteomyelitis. As per ID recommendation, patient remains on cefepime and vancomycin as empiric treatment. Remains on morphine and oxycodone for pain relief, will gradually transition off morphine to oral pain medications and muscle relaxants. 10/22, decrease dose of morphine to 2 mg every 12 hours today with goal of stopping in the  next 3 days.  Hypertension: Blood pressure stable.  Currently remains on Toprol.  COPD: Stable on as needed nebulizers.  She is a smoker and currently on nicotine patch.  Schizophrenia: Patient takes [place] every 3 months.  Currently on Cogentin Wellbutrin and Remeron that is continued.  Uncomplicated rib fracture: Symptomatic treatment.   DVT prophylaxis: enoxaparin (LOVENOX) injection 40 mg Start: 01/16/20 1300 SCDs Start: 01/07/20 1025   Code Status: Full code Family Communication: None Disposition Plan: Status is: Inpatient, patient apparently cannot be discharged with indwelling vascular access so remains in the hospital until 11/21 to complete his antibiotic courses.  Remains inpatient appropriate because:Unsafe d/c plan and Inpatient level of care appropriate due to severity of illness   Dispo: The patient is from: Home              Anticipated d/c is to: Home              Anticipated d/c date is: > 3 days              Patient currently is not medically stable to d/c.         Consultants:   Infectious disease  IR  Neurosurgery  Procedures:   Disc aspiration and biopsy attempted, unsuccessful  Antimicrobials:  Anti-infectives (From admission, onward)   Start     Dose/Rate Route Frequency Ordered Stop   01/18/20 1200  vancomycin (VANCOREADY) IVPB 1250 mg/250 mL        1,250 mg 166.7 mL/hr over 90 Minutes Intravenous Every 12 hours 01/18/20 1125     01/11/20 1400  ceFEPIme (MAXIPIME) 2 g in sodium chloride 0.9 % 100 mL IVPB  2 g 200 mL/hr over 30 Minutes Intravenous Every 8 hours 01/11/20 1013     01/09/20 0400  vancomycin (VANCOREADY) IVPB 1500 mg/300 mL  Status:  Discontinued        1,500 mg 150 mL/hr over 120 Minutes Intravenous Every 12 hours 01/08/20 1900 01/18/20 1125   01/08/20 1900  vancomycin (VANCOREADY) IVPB 1750 mg/350 mL        1,750 mg 175 mL/hr over 120 Minutes Intravenous  Once 01/08/20 1900 01/08/20 2240   01/08/20 1818   cefTRIAXone (ROCEPHIN) 2 g in sodium chloride 0.9 % 100 mL IVPB  Status:  Discontinued        2 g 200 mL/hr over 30 Minutes Intravenous Every 24 hours 01/08/20 1813 01/11/20 1011       Subjective: No new events.  Walking around in the hallway with independent.  Afebrile.  Still has some pain.  Used injectable morphine three times in last 24 hours. Objective: Vitals:   01/18/20 1658 01/18/20 2049 01/19/20 0449 01/19/20 0852  BP: 120/81 (!) 131/96 118/84 (!) 133/91  Pulse: 81 83 93 93  Resp: 18 18 18 18   Temp: 97.9 F (36.6 C) 98.4 F (36.9 C) 97.6 F (36.4 C) 98.4 F (36.9 C)  TempSrc: Oral  Oral   SpO2: 100% 100% 100% 100%  Weight:      Height:        Intake/Output Summary (Last 24 hours) at 01/19/2020 1335 Last data filed at 01/19/2020 1326 Gross per 24 hour  Intake 1905.74 ml  Output 1951 ml  Net -45.26 ml   Filed Weights   01/13/20 2110 01/16/20 2054 01/17/20 2006  Weight: 84.4 kg 83.7 kg 87.6 kg    Examination:  He looks comfortable.  He is on room air. Occasionally seen walking around. There is some tenderness on the paraspinous muscles on the left side.  Data Reviewed: I have personally reviewed following labs and imaging studies  CBC: Recent Labs  Lab 01/17/20 0817  WBC 6.6  HGB 10.6*  HCT 34.4*  MCV 84.1  PLT 405*   Basic Metabolic Panel: Recent Labs  Lab 01/15/20 0358 01/17/20 0817  NA 135 137  K 3.9 4.1  CL 104 106  CO2 24 22  GLUCOSE 112* 110*  BUN 13 13  CREATININE 0.79 1.00  CALCIUM 9.2 9.2   GFR: Estimated Creatinine Clearance: 92.6 mL/min (by C-G formula based on SCr of 1 mg/dL). Liver Function Tests: No results for input(s): AST, ALT, ALKPHOS, BILITOT, PROT, ALBUMIN in the last 168 hours. No results for input(s): LIPASE, AMYLASE in the last 168 hours. No results for input(s): AMMONIA in the last 168 hours. Coagulation Profile: No results for input(s): INR, PROTIME in the last 168 hours. Cardiac Enzymes: No results for  input(s): CKTOTAL, CKMB, CKMBINDEX, TROPONINI in the last 168 hours. BNP (last 3 results) No results for input(s): PROBNP in the last 8760 hours. HbA1C: No results for input(s): HGBA1C in the last 72 hours. CBG: No results for input(s): GLUCAP in the last 168 hours. Lipid Profile: No results for input(s): CHOL, HDL, LDLCALC, TRIG, CHOLHDL, LDLDIRECT in the last 72 hours. Thyroid Function Tests: Recent Labs    01/17/20 0817  TSH 3.680   Anemia Panel: No results for input(s): VITAMINB12, FOLATE, FERRITIN, TIBC, IRON, RETICCTPCT in the last 72 hours. Sepsis Labs: No results for input(s): PROCALCITON, LATICACIDVEN in the last 168 hours.  No results found for this or any previous visit (from the past 240 hour(s)).  Radiology Studies: No results found.      Scheduled Meds: . benztropine  1 mg Oral Daily  . buPROPion  150 mg Oral Daily  . enoxaparin (LOVENOX) injection  40 mg Subcutaneous Q24H  . ferrous sulfate  325 mg Oral Q breakfast  . gabapentin  300 mg Oral BID  . lidocaine  1 patch Transdermal Q24H  . metoprolol succinate  25 mg Oral Daily  . mirtazapine  15 mg Oral QHS  . nicotine  14 mg Transdermal Daily  . pantoprazole  40 mg Oral Daily  . senna-docusate  1 tablet Oral BID  . sodium chloride flush  10-40 mL Intracatheter Q12H  . sodium chloride flush  10-40 mL Intracatheter Q12H  . sodium chloride flush  3 mL Intravenous Q12H  . vitamin B-12  100 mcg Oral Daily   Continuous Infusions: . sodium chloride 250 mL (01/16/20 0537)  . ceFEPime (MAXIPIME) IV 2 g (01/19/20 0521)  . vancomycin 1,250 mg (01/19/20 1200)     LOS: 12 days    Time spent: 15 minutes    Dorcas Carrow, MD [facility] Pager [phone]

## 2020-01-19 NOTE — Progress Notes (Signed)
Physical Therapy Treatment Patient Details Name: [name] MRN: [id number] DOB: 01-16-1966 Today's Date: 01/19/2020    History of Present Illness Pt is a 54 y/o male with PMH of discitis April 2021, s/p laminectormy, schizophrenia, cocaine use, presenting with mechanical fall in the shower with shoulder and back pain. Imaging reveals: progresive discitits osteomyelitis of LS spine, Septic arthriits at L3-4, spinal stenosis L1-2 and improved discitis at L1-2.  Underwent apsiration/core biopsy 10/11, neurosurgery recommends medical tx. Acute non displaced Fx of anterolateral L 9th rib; R shoulder with no acute findings.     PT Comments    Pt agreeable to participate, demonstrates good activity tolerance and performing mobility at a modified independent level. Ambulating 1,000 feet with single UE use on an IV pole. Able to perform standing exercise with rail support including mini squats and bilateral hamstring curls. Continues with mild back pain; denies radicular pain. Given pt progress and level of current mobility, decreasing frequency to 1x/wk. Discussed with pt and pt agreeable to continue to ambulate daily while inpatient.     Follow Up Recommendations  No PT follow up     Equipment Recommendations  None recommended by PT    Recommendations for Other Services       Precautions / Restrictions Precautions Precautions: None Restrictions Weight Bearing Restrictions: No    Mobility  Bed Mobility Overal bed mobility: Independent                Transfers Overall transfer level: Independent Equipment used: None                Ambulation/Gait Ambulation/Gait assistance: Modified independent (Device/Increase time) Gait Distance (Feet): 1000 Feet Assistive device: IV Pole Gait Pattern/deviations: Step-through pattern;Decreased stride length;Antalgic     General Gait Details: Slightly increased antalgic gait with increased distance. No gross  imbalance   Stairs             Wheelchair Mobility    Modified Rankin (Stroke Patients Only)       Balance Overall balance assessment: Mild deficits observed, not formally tested                                          Cognition Arousal/Alertness: Awake/alert Behavior During Therapy: WFL for tasks assessed/performed Overall Cognitive Status: Within Functional Limits for tasks assessed                                        Exercises Other Exercises Other Exercises: Standing: mini squats x 5, bilateral hamstring curls x 8     General Comments        Pertinent Vitals/Pain Pain Assessment: Faces Faces Pain Scale: Hurts little more Pain Location: back Pain Descriptors / Indicators: Aching Pain Intervention(s): Monitored during session    Home Living                      Prior Function            PT Goals (current goals can now be found in the care plan section) Acute Rehab PT Goals Patient Stated Goal: to return home PT Goal Formulation: With patient Time For Goal Achievement: 01/23/20 Potential to Achieve Goals: Good Progress towards PT goals: Progressing toward goals    Frequency    Min  1X/week      PT Plan Frequency needs to be updated    Co-evaluation              AM-PAC PT "6 Clicks" Mobility   Outcome Measure  Help needed turning from your back to your side while in a flat bed without using bedrails?: None Help needed moving from lying on your back to sitting on the side of a flat bed without using bedrails?: None Help needed moving to and from a bed to a chair (including a wheelchair)?: None Help needed standing up from a chair using your arms (e.g., wheelchair or bedside chair)?: None Help needed to walk in hospital room?: None Help needed climbing 3-5 steps with a railing? : A Little 6 Click Score: 23    End of Session   Activity Tolerance: Patient tolerated treatment  well Patient left: in bed;with call bell/phone within reach Nurse Communication: Mobility status PT Visit Diagnosis: Unsteadiness on feet (R26.81)     Time: 4259-5638 PT Time Calculation (min) (ACUTE ONLY): 14 min  Charges:  $Therapeutic Activity: 8-22 mins                     Lillia Pauls, PT, DPT Acute Rehabilitation Services Pager [phone] Office [phone]    Norval Morton 01/19/2020, 5:15 PM

## 2020-01-19 NOTE — Plan of Care (Signed)
  Problem: Activity: Goal: Risk for activity intolerance will decrease Outcome: Progressing   

## 2020-01-19 NOTE — TOC CAGE-AID Note (Signed)
Transition of Care (TOC) - CAGE-AID Screening   Patient Details  Name: [name] MRN: [id number] Date of Birth: 06/08/1965  Transition of Care (TOC) CM/SW Contact:      , LCSWA Phone Number: 01/19/2020, 2:19 PM   Clinical Narrative:  CSW met with pt at bedside. CSW introduced self and explained her role at the hospital.  Pt reports he has been alcohol free for two years. Pt states he has been an alcoholic most of his life but believes he has stopped for good. CSW praised pt on his progress. Pt reports he will occasionally take a benzo if he cant sleep and will occasionally take a adderall. Pt stated its a prescription so its fine. CSW explained that neither of those are his prescriptions, so they are considered Illicit. PT reports he rarely uses cocaine.  PT reports that because of irregular substance use that he can stop on his own and does not need resources at this time.   CAGE-AID Screening:    Have You Ever Felt You Ought to Cut Down on Your Drinking or Drug Use?: Yes Have People Annoyed You By Critizing Your Drinking Or Drug Use?: No Have You Felt Bad Or Guilty About Your Drinking Or Drug Use?: No Have You Ever Had a Drink or Used Drugs First Thing In The Morning to Steady Your Nerves or to Get Rid of a Hangover?: Yes CAGE-AID Score: 2  Substance Abuse Education Offered: Yes  Substance abuse interventions: Patient Counseling    , LCSWA, LCASA Clinical Social Worker [phone]     

## 2020-01-19 NOTE — Plan of Care (Signed)
  Problem: Elimination: Goal: Will not experience complications related to bowel motility Outcome: Progressing   

## 2020-01-20 DIAGNOSIS — M4626 Osteomyelitis of vertebra, lumbar region: Secondary | ICD-10-CM | POA: Diagnosis not present

## 2020-01-20 MED ORDER — MORPHINE SULFATE 15 MG PO TABS
15.0000 mg | ORAL_TABLET | Freq: Four times a day (QID) | ORAL | Status: DC | PRN
  Administered 2020-01-20 – 2020-01-21 (×4): 15 mg via ORAL
  Filled 2020-01-20 (×4): qty 1

## 2020-01-20 NOTE — Progress Notes (Signed)
PROGRESS NOTE    [name]  ZOX:[id number] DOB: 10/17/1965 DOA: 01/06/2020 PCP: [name], MD    Brief Narrative:  54 year old with past medical history significant for discitis on 4/21 treated with 6 weeks of vancomycin and ceftriaxone culture was unrevealing, status post laminectomy, schizophrenia and ongoing cocaine use presented to the ER with shoulder pain and back pain after a mechanical fall 4 days prior to admission.  In the emergency room he was noted to have isolated rib fracture without pneumothorax, lumbosacral spine showed possible progressive discitis and osteomyelitis.  MRI showed septic arthritis L3/4, associated epidural phlegmon and enhancement extending from T12-L1.  Patient was evaluated by neurosurgery and  recommended medical treatment and IR guided disc space biopsy to guide antibiotic therapy and hence admitted to the hospital. Patient had all successful attempt at disc aspiration by interventional radiology due to severe disc space narrowing and degenerative osteophytes.   Assessment & Plan:   Principal Problem:   Acute osteomyelitis of lumbar spine (HCC) Active Problems:   Cocaine abuse (HCC)   Benign essential HTN   History of CVA (cerebrovascular accident)   Chronic pain syndrome   Closed fracture of one rib of left side   Discitis of lumbar region   Ankle ankylosis, left  Recurrent discitis/osteomyelitis of L3 and 4 vertebrae with associated epidural phlegmon: Previously negative cultures.  Previously treated with 6 weeks of IV ceftriaxone and vancomycin. Unsuccessful attempted disc aspiration and biopsy by IR due to severe disc space narrowing and degenerative osteophytes. Blood cultures are negative so far. Also had changes on left foot that was equivocal for chronic osteomyelitis. As per ID recommendation, patient remains on cefepime and vancomycin as empiric treatment. Will transitioned his pain medication to morphine sulfate every 6 hours  as needed.  Discontinue oxycodone and morphine IV.  Hypertension: Blood pressure stable.  Currently remains on Toprol.  COPD: Stable on as needed nebulizers.  She is a smoker and currently on nicotine patch.  Schizophrenia: Patient takes [place] every 3 months.  Currently on Cogentin Wellbutrin and Remeron  Uncomplicated rib fracture: Symptomatic treatment.   DVT prophylaxis: enoxaparin (LOVENOX) injection 40 mg Start: 01/16/20 1300 SCDs Start: 01/07/20 1025   Code Status: Full code Family Communication: None at bedside Disposition Plan: Status is: Inpatient, patient apparently cannot be discharged with indwelling vascular access so remains in the hospital until 11/21 to complete his antibiotic courses.    Dispo: The patient is from: Home              Anticipated d/c is to: Home              Anticipated d/c date is: Greater than 3 days              Patient currently is not medically stable to d/c.         Consultants:   Infectious disease  IR  Neurosurgery  Procedures:   Disc aspiration and biopsy attempted, unsuccessful  Antimicrobials:  Anti-infectives (From admission, onward)   Start     Dose/Rate Route Frequency Ordered Stop   01/18/20 1200  vancomycin (VANCOREADY) IVPB 1250 mg/250 mL        1,250 mg 166.7 mL/hr over 90 Minutes Intravenous Every 12 hours 01/18/20 1125     01/11/20 1400  ceFEPIme (MAXIPIME) 2 g in sodium chloride 0.9 % 100 mL IVPB        2 g 200 mL/hr over 30 Minutes Intravenous Every 8 hours 01/11/20 1013  01/09/20 0400  vancomycin (VANCOREADY) IVPB 1500 mg/300 mL  Status:  Discontinued        1,500 mg 150 mL/hr over 120 Minutes Intravenous Every 12 hours 01/08/20 1900 01/18/20 1125   01/08/20 1900  vancomycin (VANCOREADY) IVPB 1750 mg/350 mL        1,750 mg 175 mL/hr over 120 Minutes Intravenous  Once 01/08/20 1900 01/08/20 2240   01/08/20 1818  cefTRIAXone (ROCEPHIN) 2 g in sodium chloride 0.9 % 100 mL IVPB  Status:  Discontinued         2 g 200 mL/hr over 30 Minutes Intravenous Every 24 hours 01/08/20 1813 01/11/20 1011       Subjective: Patient was seen and evaluated at bedside.  He is standing up in the room complaining of back pain.  Lower lumbar scar that is healed noted.  I discussed with patient about switching his pain medication to p.o. morphine sulfate and discontinued IV morphine and oxycodone.  Patient is in agreement. Is currently on admission already treated for discitis with osteomyelitis.  Currently on vancomycin and cefepime.  ID is on board, along with neurosurgery and interventional radiology. Objective: Vitals:   01/19/20 1727 01/19/20 2218 01/20/20 0606 01/20/20 0925  BP: 122/86 (!) 147/99 (!) 139/98 124/75  Pulse: 95 91 92 (!) 102  Resp: 17   18  Temp: 98.5 F (36.9 C) 98.7 F (37.1 C) 98.5 F (36.9 C) 98.1 F (36.7 C)  TempSrc:  Oral Oral   SpO2: 100% 100% 100% 98%  Weight:  85.9 kg    Height:        Intake/Output Summary (Last 24 hours) at 01/20/2020 1353 Last data filed at 01/20/2020 0959 Gross per 24 hour  Intake 1931.72 ml  Output 1675 ml  Net 256.72 ml   Filed Weights   01/16/20 2054 01/17/20 2006 01/19/20 2218  Weight: 83.7 kg 87.6 kg 85.9 kg    Examination: General: He is alert and oriented x3 and not in acute distress.  He is ambulatory in room and complains of back pain Respiratory: Chest is clear bilaterally to auscultation Cardiovascular: Heart sounds 1 and 2 with no murmur, rubs or gallops Abdomen: Soft, nontender and no organs are palpable Musculoskeletal: No edema: Ambulatory in room Neurologic.  Back pain.  No neurologic deficit Skin: Healed surgical scar right lumbosacral area back Psychiatry: Mood is normal and behavior is normal.  Data Reviewed: I have personally reviewed following labs and imaging studies  CBC: Recent Labs  Lab 01/17/20 0817  WBC 6.6  HGB 10.6*  HCT 34.4*  MCV 84.1  PLT 405*   Basic Metabolic Panel: Recent Labs  Lab  01/15/20 0358 01/17/20 0817  NA 135 137  K 3.9 4.1  CL 104 106  CO2 24 22  GLUCOSE 112* 110*  BUN 13 13  CREATININE 0.79 1.00  CALCIUM 9.2 9.2   GFR: Estimated Creatinine Clearance: 91.7 mL/min (by C-G formula based on SCr of 1 mg/dL). Liver Function Tests: No results for input(s): AST, ALT, ALKPHOS, BILITOT, PROT, ALBUMIN in the last 168 hours. No results for input(s): LIPASE, AMYLASE in the last 168 hours. No results for input(s): AMMONIA in the last 168 hours. Coagulation Profile: No results for input(s): INR, PROTIME in the last 168 hours. Cardiac Enzymes: No results for input(s): CKTOTAL, CKMB, CKMBINDEX, TROPONINI in the last 168 hours. BNP (last 3 results) No results for input(s): PROBNP in the last 8760 hours. HbA1C: No results for input(s): HGBA1C in the last 72 hours.  CBG: No results for input(s): GLUCAP in the last 168 hours. Lipid Profile: No results for input(s): CHOL, HDL, LDLCALC, TRIG, CHOLHDL, LDLDIRECT in the last 72 hours. Thyroid Function Tests: No results for input(s): TSH, T4TOTAL, FREET4, T3FREE, THYROIDAB in the last 72 hours. Anemia Panel: No results for input(s): VITAMINB12, FOLATE, FERRITIN, TIBC, IRON, RETICCTPCT in the last 72 hours. Sepsis Labs: No results for input(s): PROCALCITON, LATICACIDVEN in the last 168 hours.  No results found for this or any previous visit (from the past 240 hour(s)).       Radiology Studies: No results found.      Scheduled Meds: . benztropine  1 mg Oral Daily  . buPROPion  150 mg Oral Daily  . enoxaparin (LOVENOX) injection  40 mg Subcutaneous Q24H  . ferrous sulfate  325 mg Oral Q breakfast  . gabapentin  300 mg Oral BID  . lidocaine  1 patch Transdermal Q24H  . metoprolol succinate  25 mg Oral Daily  . mirtazapine  15 mg Oral QHS  . nicotine  14 mg Transdermal Daily  . pantoprazole  40 mg Oral Daily  . senna-docusate  1 tablet Oral BID  . sodium chloride flush  10-40 mL Intracatheter Q12H  .  sodium chloride flush  10-40 mL Intracatheter Q12H  . sodium chloride flush  3 mL Intravenous Q12H  . vitamin B-12  100 mcg Oral Daily   Continuous Infusions: . sodium chloride 250 mL (01/16/20 0537)  . ceFEPime (MAXIPIME) IV 2 g (01/20/20 0542)  . vancomycin 1,250 mg (01/20/20 1137)     LOS: 13 days    Time spent: 30 minutes    Aquilla Hacker, MD [facility] Pager [phone]

## 2020-01-21 DIAGNOSIS — M4626 Osteomyelitis of vertebra, lumbar region: Secondary | ICD-10-CM | POA: Diagnosis not present

## 2020-01-21 LAB — COMPREHENSIVE METABOLIC PANEL
ALT: 14 U/L (ref 0–44)
AST: 25 U/L (ref 15–41)
Albumin: 3.8 g/dL (ref 3.5–5.0)
Alkaline Phosphatase: 142 U/L — ABNORMAL HIGH (ref 38–126)
Anion gap: 9 (ref 5–15)
BUN: 13 mg/dL (ref 6–20)
CO2: 27 mmol/L (ref 22–32)
Calcium: 9.6 mg/dL (ref 8.9–10.3)
Chloride: 101 mmol/L (ref 98–111)
Creatinine, Ser: 1.03 mg/dL (ref 0.61–1.24)
GFR, Estimated: >60 mL/min (ref >60)
Glucose, Bld: 100 mg/dL — ABNORMAL HIGH (ref 70–99)
Potassium: 4.2 mmol/L (ref 3.5–5.1)
Sodium: 137 mmol/L (ref 135–145)
Total Bilirubin: 0.9 mg/dL (ref 0.3–1.2)
Total Protein: 7.4 g/dL (ref 6.5–8.1)

## 2020-01-21 LAB — CBC
HCT: 38.6 % — ABNORMAL LOW (ref 39.0–52.0)
Hemoglobin: 11.9 g/dL — ABNORMAL LOW (ref 13.0–17.0)
MCH: 26.2 pg (ref 26.0–34.0)
MCHC: 30.8 g/dL (ref 30.0–36.0)
MCV: 85 fL (ref 80.0–100.0)
Platelets: 436 10*3/uL — ABNORMAL HIGH (ref 150–400)
RBC: 4.54 MIL/uL (ref 4.22–5.81)
RDW: 15.9 % — ABNORMAL HIGH (ref 11.5–15.5)
WBC: 6.7 10*3/uL (ref 4.0–10.5)
nRBC: 0 % (ref 0.0–0.2)

## 2020-01-21 MED ORDER — MORPHINE SULFATE 15 MG PO TABS
30.0000 mg | ORAL_TABLET | Freq: Four times a day (QID) | ORAL | Status: DC | PRN
  Administered 2020-01-21 – 2020-02-20 (×98): 30 mg via ORAL
  Filled 2020-01-21 (×102): qty 2

## 2020-01-21 NOTE — Progress Notes (Signed)
PROGRESS NOTE    [name]  ZOX:[id number] DOB: March 08, 1966 DOA: 01/06/2020 PCP: [name], MD    Brief Narrative:  54 year old with past medical history significant for discitis on 4/21 treated with 6 weeks of vancomycin and ceftriaxone culture was unrevealing, status post laminectomy, schizophrenia and ongoing cocaine use presented to the ER with shoulder pain and back pain after a mechanical fall 4 days prior to admission.  In the emergency room he was noted to have isolated rib fracture without pneumothorax, lumbosacral spine showed possible progressive discitis and osteomyelitis.  MRI showed septic arthritis L3/4, associated epidural phlegmon and enhancement extending from T12-L1.  Patient was evaluated by neurosurgery and  recommended medical treatment and IR guided disc space biopsy to guide antibiotic therapy and hence admitted to the hospital. Patient had all successful attempt at disc aspiration by interventional radiology due to severe disc space narrowing and degenerative osteophytes.   Assessment & Plan:   Principal Problem:   Acute osteomyelitis of lumbar spine (HCC) Active Problems:   Cocaine abuse (HCC)   Benign essential HTN   History of CVA (cerebrovascular accident)   Chronic pain syndrome   Closed fracture of one rib of left side   Discitis of lumbar region   Ankle ankylosis, left  Recurrent discitis/osteomyelitis of L3 and 4 vertebrae with associated epidural phlegmon: Previously negative cultures.  Previously treated with 6 weeks of IV ceftriaxone and vancomycin. Unsuccessful attempted disc aspiration and biopsy by IR due to severe disc space narrowing and degenerative osteophytes. Blood cultures are negative so far. Also had changes on left foot that was equivocal for chronic osteomyelitis. As per ID recommendation, patient remains on cefepime and vancomycin as empiric treatment. Continue with morphine sulfate every 6 hours.  Hypertension: Blood  pressure stable.  Currently remains on Toprol.  COPD: Stable on as needed nebulizers.  She is a smoker and currently on nicotine patch.  Schizophrenia: Patient takes [place] every 3 months.  Currently on Cogentin Wellbutrin and Remeron  Uncomplicated rib fracture: Symptomatic treatment.   DVT prophylaxis: enoxaparin (LOVENOX) injection 40 mg Start: 01/16/20 1300 SCDs Start: 01/07/20 1025   Code Status: Full code Family Communication: None at bedside Disposition Plan: Status is: Inpatient, patient apparently cannot be discharged with indwelling vascular access so remains in the hospital until 11/21 to complete his antibiotic courses.    Dispo: The patient is from: Home              Anticipated d/c is to: Home              Anticipated d/c date is: Greater than 3 days              Patient currently is not medically stable to d/c.         Consultants:   Infectious disease  IR  Neurosurgery  Procedures:   Disc aspiration and biopsy attempted, unsuccessful  Antimicrobials:  Anti-infectives (From admission, onward)   Start     Dose/Rate Route Frequency Ordered Stop   01/18/20 1200  vancomycin (VANCOREADY) IVPB 1250 mg/250 mL        1,250 mg 166.7 mL/hr over 90 Minutes Intravenous Every 12 hours 01/18/20 1125     01/11/20 1400  ceFEPIme (MAXIPIME) 2 g in sodium chloride 0.9 % 100 mL IVPB        2 g 200 mL/hr over 30 Minutes Intravenous Every 8 hours 01/11/20 1013     01/09/20 0400  vancomycin (VANCOREADY) IVPB 1500 mg/300 mL  Status:  Discontinued        1,500 mg 150 mL/hr over 120 Minutes Intravenous Every 12 hours 01/08/20 1900 01/18/20 1125   01/08/20 1900  vancomycin (VANCOREADY) IVPB 1750 mg/350 mL        1,750 mg 175 mL/hr over 120 Minutes Intravenous  Once 01/08/20 1900 01/08/20 2240   01/08/20 1818  cefTRIAXone (ROCEPHIN) 2 g in sodium chloride 0.9 % 100 mL IVPB  Status:  Discontinued        2 g 200 mL/hr over 30 Minutes Intravenous Every 24 hours 01/08/20  1813 01/11/20 1011       Subjective: Patient was seen and evaluated at bedside.  He is standing up in the room complaining of back pain.  Lower lumbar scar that is healed noted.  I discussed with patient about switching his pain medication to p.o. morphine sulfate and discontinued IV morphine and oxycodone.  Patient is in agreement. Is currently on admission already treated for discitis with osteomyelitis.  Currently on vancomycin and cefepime.  ID is on board, along with neurosurgery and interventional radiology. Objective: Vitals:   01/20/20 2036 01/21/20 0600 01/21/20 0622 01/21/20 0838  BP: (!) 119/91 122/83  107/74  Pulse: 85 89  98  Resp: 15 16  18   Temp: 98.5 F (36.9 C)  97.6 F (36.4 C) 98.4 F (36.9 C)  TempSrc: Oral  Oral   SpO2: 98% 98%  100%  Weight: 86.1 kg     Height:        Intake/Output Summary (Last 24 hours) at 01/21/2020 1510 Last data filed at 01/21/2020 1349 Gross per 24 hour  Intake 2267.55 ml  Output 1025 ml  Net 1242.55 ml   Filed Weights   01/17/20 2006 01/19/20 2218 01/20/20 2036  Weight: 87.6 kg 85.9 kg 86.1 kg    Examination: General: He is alert and oriented x3 and not in acute distress.  He is ambulatory in room and complains of back pain Respiratory: Chest is clear bilaterally to auscultation Cardiovascular: Heart sounds 1 and 2 with no murmur, rubs or gallops Abdomen: Soft, nontender and no organs are palpable Musculoskeletal: No edema: Ambulatory in room Neurologic.  Back pain.  No neurologic deficit Skin: Healed surgical scar right lumbosacral area back Psychiatry: Mood is normal and behavior is normal.  Data Reviewed: I have personally reviewed following labs and imaging studies  CBC: Recent Labs  Lab 01/17/20 0817 01/21/20 1234  WBC 6.6 6.7  HGB 10.6* 11.9*  HCT 34.4* 38.6*  MCV 84.1 85.0  PLT 405* 436*   Basic Metabolic Panel: Recent Labs  Lab 01/15/20 0358 01/17/20 0817 01/21/20 1234  NA 135 137 137  K 3.9 4.1  4.2  CL 104 106 101  CO2 24 22 27   GLUCOSE 112* 110* 100*  BUN 13 13 13   CREATININE 0.79 1.00 1.03  CALCIUM 9.2 9.2 9.6   GFR: Estimated Creatinine Clearance: 89.2 mL/min (by C-G formula based on SCr of 1.03 mg/dL). Liver Function Tests: Recent Labs  Lab 01/21/20 1234  AST 25  ALT 14  ALKPHOS 142*  BILITOT 0.9  PROT 7.4  ALBUMIN 3.8   No results for input(s): LIPASE, AMYLASE in the last 168 hours. No results for input(s): AMMONIA in the last 168 hours. Coagulation Profile: No results for input(s): INR, PROTIME in the last 168 hours. Cardiac Enzymes: No results for input(s): CKTOTAL, CKMB, CKMBINDEX, TROPONINI in the last 168 hours. BNP (last 3 results) No results for input(s): PROBNP in the last  8760 hours. HbA1C: No results for input(s): HGBA1C in the last 72 hours. CBG: No results for input(s): GLUCAP in the last 168 hours. Lipid Profile: No results for input(s): CHOL, HDL, LDLCALC, TRIG, CHOLHDL, LDLDIRECT in the last 72 hours. Thyroid Function Tests: No results for input(s): TSH, T4TOTAL, FREET4, T3FREE, THYROIDAB in the last 72 hours. Anemia Panel: No results for input(s): VITAMINB12, FOLATE, FERRITIN, TIBC, IRON, RETICCTPCT in the last 72 hours. Sepsis Labs: No results for input(s): PROCALCITON, LATICACIDVEN in the last 168 hours.  No results found for this or any previous visit (from the past 240 hour(s)).       Radiology Studies: No results found.      Scheduled Meds: . benztropine  1 mg Oral Daily  . buPROPion  150 mg Oral Daily  . enoxaparin (LOVENOX) injection  40 mg Subcutaneous Q24H  . ferrous sulfate  325 mg Oral Q breakfast  . gabapentin  300 mg Oral BID  . lidocaine  1 patch Transdermal Q24H  . metoprolol succinate  25 mg Oral Daily  . mirtazapine  15 mg Oral QHS  . nicotine  14 mg Transdermal Daily  . pantoprazole  40 mg Oral Daily  . senna-docusate  1 tablet Oral BID  . sodium chloride flush  10-40 mL Intracatheter Q12H  . sodium  chloride flush  10-40 mL Intracatheter Q12H  . sodium chloride flush  3 mL Intravenous Q12H  . vitamin B-12  100 mcg Oral Daily   Continuous Infusions: . sodium chloride Stopped (01/21/20 0228)  . ceFEPime (MAXIPIME) IV 2 g (01/21/20 1337)  . vancomycin 1,250 mg (01/21/20 1158)     LOS: 14 days    Time spent: 31 minutes    Aquilla Hacker, MD [facility] Pager [phone]

## 2020-01-21 NOTE — Progress Notes (Signed)
Pharmacy Antibiotic Note  [name] is a 54 y.o. male admitted on 01/06/2020 with prior history of lumbar osteomyelitis/discitis in May-June 2021 now with back pain post fall at home and MRI showing lumbar osteomyelitis.  Neurosurgery unable to get successful disc aspiration during biopsy. Patient with history of L ankle Enterobacter infection s/p hardware removal in 2019; ortho to see patient here, possible chronic v advancing osteo. Pharmacy has been consulted for Vancomycin and cefepime dosing to cover for Enterobacter.  Plan is for 6 wks of IV abx (vanc/cefepime) then consolidate to PO doxy and keflex for another month.  Serum creatinine 10/24 stable.  Will get vancomycin trough 10/25 since recent dose change.    Plan: Vancomycin 1250 mg IV every 12 hours Cefepime 2g q8h Vanc trough 10/25 before AM dose Monitor clinical status, renal function, culture results Monitor Vanc trough per protocol, at least once weekly, and monitor serum creatinine weekly.   Height: 5\' 9"  (175.3 cm) Weight: 86.1 kg (189 lb 13.1 oz) IBW/kg (Calculated) : 70.7  Temp (24hrs), Avg:98.2 F (36.8 C), Min:97.6 F (36.4 C), Max:98.5 F (36.9 C)  Recent Labs  Lab 01/15/20 0358 01/17/20 0817 01/18/20 0949  WBC  --  6.6  --   CREATININE 0.79 1.00  --   VANCOTROUGH  --   --  23*    Estimated Creatinine Clearance: 91.9 mL/min (by C-G formula based on SCr of 1 mg/dL).    Allergies  Allergen Reactions  . Chlorhexidine Itching and Rash    Antimicrobials this admission: CTX 10/11 >> 10/14 Vanc 10/11 >> Cefepime 10/14 >>   Dose adjustments / vancomycin trough levels: 10/15 VT =22 mcg/ml but dose prior PM dose was given ~4 hours late so true trough ~15 mcg/ml (therapeutic) on 1500mg  q12h> continue current dose.  10/21 VT = 23 , drawn ~50 min early , 1.5g q12h>will decrease to 1250 mg q12h   Microbiology results: 10/11 fluid, disc L1 cx: negative 10/11 BC x2: negative 10/10 COVID, flu:  neg  Dimple Nanas, PharmD PGY-1 Acute Care Pharmacy Resident Office: [phone] 01/21/2020 1:16 PM

## 2020-01-21 NOTE — Plan of Care (Signed)
  Problem: Education: Goal: Knowledge of General Education information will improve Description: Including pain rating scale, medication(s)/side effects and non-pharmacologic comfort measures Outcome: Completed/Met   Problem: Clinical Measurements: Goal: Diagnostic test results will improve Outcome: Completed/Met Goal: Cardiovascular complication will be avoided Outcome: Completed/Met

## 2020-01-22 DIAGNOSIS — F1410 Cocaine abuse, uncomplicated: Secondary | ICD-10-CM | POA: Diagnosis not present

## 2020-01-22 DIAGNOSIS — I10 Essential (primary) hypertension: Secondary | ICD-10-CM | POA: Diagnosis not present

## 2020-01-22 DIAGNOSIS — M4626 Osteomyelitis of vertebra, lumbar region: Secondary | ICD-10-CM | POA: Diagnosis not present

## 2020-01-22 DIAGNOSIS — M4646 Discitis, unspecified, lumbar region: Secondary | ICD-10-CM | POA: Diagnosis not present

## 2020-01-22 LAB — COMPREHENSIVE METABOLIC PANEL
ALT: 13 U/L (ref 0–44)
AST: 24 U/L (ref 15–41)
Albumin: 3.7 g/dL (ref 3.5–5.0)
Alkaline Phosphatase: 147 U/L — ABNORMAL HIGH (ref 38–126)
Anion gap: 10 (ref 5–15)
BUN: 14 mg/dL (ref 6–20)
CO2: 25 mmol/L (ref 22–32)
Calcium: 9.8 mg/dL (ref 8.9–10.3)
Chloride: 103 mmol/L (ref 98–111)
Creatinine, Ser: 1.16 mg/dL (ref 0.61–1.24)
GFR, Estimated: >60 mL/min (ref >60)
Glucose, Bld: 94 mg/dL (ref 70–99)
Potassium: 4.1 mmol/L (ref 3.5–5.1)
Sodium: 138 mmol/L (ref 135–145)
Total Bilirubin: 0.8 mg/dL (ref 0.3–1.2)
Total Protein: 7 g/dL (ref 6.5–8.1)

## 2020-01-22 LAB — CBC
HCT: 38.5 % — ABNORMAL LOW (ref 39.0–52.0)
Hemoglobin: 11.8 g/dL — ABNORMAL LOW (ref 13.0–17.0)
MCH: 26.1 pg (ref 26.0–34.0)
MCHC: 30.6 g/dL (ref 30.0–36.0)
MCV: 85.2 fL (ref 80.0–100.0)
Platelets: 435 10*3/uL — ABNORMAL HIGH (ref 150–400)
RBC: 4.52 MIL/uL (ref 4.22–5.81)
RDW: 15.8 % — ABNORMAL HIGH (ref 11.5–15.5)
WBC: 7.5 10*3/uL (ref 4.0–10.5)
nRBC: 0 % (ref 0.0–0.2)

## 2020-01-22 LAB — MAGNESIUM: Magnesium: 2 mg/dL (ref 1.7–2.4)

## 2020-01-22 LAB — VANCOMYCIN, TROUGH: Vancomycin Tr: 21 ug/mL (ref 15–20)

## 2020-01-22 LAB — PHOSPHORUS: Phosphorus: 5.3 mg/dL — ABNORMAL HIGH (ref 2.5–4.6)

## 2020-01-22 MED ORDER — CARVEDILOL 3.125 MG PO TABS
3.1250 mg | ORAL_TABLET | Freq: Two times a day (BID) | ORAL | Status: DC
  Administered 2020-01-23 – 2020-01-25 (×5): 3.125 mg via ORAL
  Filled 2020-01-22 (×5): qty 1

## 2020-01-22 MED ORDER — VANCOMYCIN HCL IN DEXTROSE 1-5 GM/200ML-% IV SOLN
1000.0000 mg | Freq: Two times a day (BID) | INTRAVENOUS | Status: AC
  Administered 2020-01-22 – 2020-02-18 (×55): 1000 mg via INTRAVENOUS
  Filled 2020-01-22 (×56): qty 200

## 2020-01-22 NOTE — Progress Notes (Signed)
Pharmacy Antibiotic Note  [name] is a 54 y.o. male admitted on 01/06/2020 with prior history of lumbar osteomyelitis/discitis in May-June 2021 now with back pain post fall at home and MRI showing lumbar osteomyelitis.  Neurosurgery unable to get successful disc aspiration during biopsy. Patient with history of L ankle Enterobacter infection s/p hardware removal in 2019; ortho to see patient here, possible chronic v advancing osteo. Pharmacy has been consulted for Vancomycin and cefepime dosing to cover for Enterobacter.  Plan is for 6 wks of IV abx (vanc/cefepime) then consolidate to PO doxy and keflex for another month.  Serum creatinine 10/24 stable.    Vanc trough is 21 mg/L at 12 hours.    Plan: Decrease vancomycin 1000 mg IV every 12 hours Cefepime 2g q8h Monitor clinical status, renal function, culture results Monitor Vanc trough per protocol, at least once weekly, and monitor serum creatinine weekly.   Height: 5\' 9"  (175.3 cm) Weight: 86.5 kg (190 lb 11.2 oz) IBW/kg (Calculated) : 70.7  Temp (24hrs), Avg:98.3 F (36.8 C), Min:97.9 F (36.6 C), Max:98.9 F (37.2 C)  Recent Labs  Lab 01/17/20 0817 01/18/20 0949 01/21/20 1234 01/22/20 0309 01/22/20 1232  WBC 6.6  --  6.7 7.5  --   CREATININE 1.00  --  1.03 1.16  --   VANCOTROUGH  --  23*  --   --  21*    Estimated Creatinine Clearance: 79.3 mL/min (by C-G formula based on SCr of 1.16 mg/dL).    Allergies  Allergen Reactions  . Chlorhexidine Itching and Rash    Antimicrobials this admission: CTX 10/11 >> 10/14 Vanc 10/11 >> Cefepime 10/14 >>   Dose adjustments / vancomycin trough levels: 10/15 VT =22 mcg/ml but dose prior PM dose was given ~4 hours late so true trough ~15 mcg/ml (therapeutic) on 1500mg  q12h> continue current dose.  10/21 VT = 23 , drawn ~50 min early , 1.5g q12h>will decrease to 1250 mg q12h 10/25 VT= 21, drawn at 12 hours, decrease to 1000mg  IV q12h   Microbiology results: 10/11 fluid,  disc L1 cx: negative 10/11 BC x2: negative 10/10 COVID, flu: neg  Raliyah Montella A. Levada Dy, PharmD, BCPS, FNKF Clinical Pharmacist [place] Please utilize Amion for appropriate phone number to reach the unit pharmacist ([place])   01/22/2020 1:50 PM

## 2020-01-22 NOTE — Plan of Care (Signed)
  Problem: Elimination: Goal: Will not experience complications related to urinary retention Outcome: Completed/Met   Problem: Safety: Goal: Ability to remain free from injury will improve Outcome: Completed/Met

## 2020-01-22 NOTE — Progress Notes (Signed)
[facility] PROGRESS NOTE    Progress Note  [name]  YQI:[id number] DOB: 11-14-1965 DOA: 01/06/2020 PCP: [name], MD     Brief Narrative:   [name] is an 54 y.o. male past medical history significant for discitis and 4/21 treated with 6 weeks of IV Vanco and Rocephin culture negative status post laminectomy schizophrenia ongoing cocaine abuse comes into the ED for shoulder and back pain after mechanical fall.  MRI showed septic arthritis of L3-L4 associated with phlegmon extending into T12-L1.  Neurosurgery was consulted who recommended IR guided biopsy and aspiration which was successful.  Assessment/Plan:   Acute osteomyelitis of lumbar spine (HCC) With a previous admission and discharge earlier this year for which he completed a 6 weeks of IV Rocephin and Vancomycin. Unsuccessful aspiration and biopsy by IR.  Culture data negative till date. Infectious disease was consulted recommended to continue IV vancomycin and cefepime. Continue narcotics for pain control.  Essential hypertension: Continue Toprol due to her history of cocaine abuse, started on Coreg low-dose tomorrow morning.  COPD uncomplicated: Continue nebulizers appears to be stable.  Schizophrenia: [place] every 3 months continue Cogentin Wellbutrin and Remeron.  Uncomplicated rib fracture: Continue symptomatic treatment.  Cocaine abuse (HCC) Noted, counseling.   DVT prophylaxis: lovenox Family Communication:none Status is: Inpatient  Remains inpatient appropriate because:IV treatments appropriate due to intensity of illness or inability to take PO   Dispo: The patient is from: Home              Anticipated d/c is to: Home              Anticipated d/c date is: > 3 days              Patient currently is not medically stable to d/c.        Code Status:     Code Status Orders  (From admission, onward)         Start     Ordered   01/07/20 1025  Full code   Continuous        01/07/20 1026        Code Status History    Date Active Date Inactive Code Status Order ID Comments User Context   07/26/2019 0425 09/05/2019 2002 Full Code [id number]  Marinda Elk, MD ED   04/11/2019 1444 04/13/2019 1720 Full Code [id number]  Kerrin Champagne, MD Inpatient   11/21/2018 0914 11/21/2018 2210 Full Code [id number]  Wynetta Fines, MD ED   01/28/2018 2012 01/31/2018 1935 Full Code [id number]  Charlsie Quest, MD ED   10/20/2017 1055 10/21/2017 2228 Full Code [id number]  Nadara Mustard, MD Inpatient   07/21/2016 1655 07/24/2016 1546 Full Code [id number]  Cammy Copa, MD Inpatient   02/18/2015 1941 02/21/2015 1635 Full Code [id number]  Alison Murray, MD Inpatient   03/01/2014 2211 03/05/2014 1902 Full Code [id number]  Assunta Found, NP Inpatient   03/01/2014 2150 03/01/2014 2211 Full Code [id number]  Assunta Found, NP Inpatient   02/28/2014 2106 03/01/2014 2150 Full Code [id number]  Marcelene [facility]te ED   12/04/2013 1221 12/07/2013 1505 Full Code [id number]  Alison Murray, MD ED   11/03/2013 1548 11/04/2013 1602 Full Code [id number]  Toy Cookey, MD ED   11/03/2013 1518 11/03/2013 1548 Full Code [id number]  Toy Cookey, MD ED   Advance Care Planning Activity        IV Access:    Peripheral IV   Procedures  and diagnostic studies:   No results found.   Medical Consultants:    None.  Anti-Infectives:   IV vanc and cefepime  Subjective:    [name] continues to have back pain.  Objective:    Vitals:   01/21/20 1719 01/21/20 2016 01/22/20 0637 01/22/20 0845  BP: 123/84 121/90 104/75 100/77  Pulse: 89 81 84   Resp: 18 18 16 15   Temp: 98.2 F (36.8 C) 98.9 F (37.2 C) 97.9 F (36.6 C) 98 F (36.7 C)  TempSrc:  Oral Oral Oral  SpO2: 100% 100% 98% 98%  Weight:  86.5 kg    Height:       SpO2: 98 % O2 Flow Rate (L/min): 2 L/min   Intake/Output Summary (Last 24 hours) at 01/22/2020 0945 Last data filed at 01/22/2020 0600 Gross per  24 hour  Intake 2356 ml  Output 200 ml  Net 2156 ml   Filed Weights   01/19/20 2218 01/20/20 2036 01/21/20 2016  Weight: 85.9 kg 86.1 kg 86.5 kg    Exam: General exam: In no acute distress. Respiratory system: Good air movement and clear to auscultation. Cardiovascular system: S1 & S2 heard, RRR. No JVD. Gastrointestinal system: Abdomen is nondistended, soft and nontender.  Extremities: No pedal edema. Skin: No rashes, lesions or ulcers Psychiatry: Judgement and insight appear normal. Mood & affect appropriate.    Data Reviewed:    Labs: Basic Metabolic Panel: Recent Labs  Lab 01/17/20 0817 01/17/20 0817 01/21/20 1234 01/22/20 0309  NA 137  --  137 138  K 4.1   < > 4.2 4.1  CL 106  --  101 103  CO2 22  --  27 25  GLUCOSE 110*  --  100* 94  BUN 13  --  13 14  CREATININE 1.00  --  1.03 1.16  CALCIUM 9.2  --  9.6 9.8  MG  --   --   --  2.0  PHOS  --   --   --  5.3*   < > = values in this interval not displayed.   GFR Estimated Creatinine Clearance: 79.3 mL/min (by C-G formula based on SCr of 1.16 mg/dL). Liver Function Tests: Recent Labs  Lab 01/21/20 1234 01/22/20 0309  AST 25 24  ALT 14 13  ALKPHOS 142* 147*  BILITOT 0.9 0.8  PROT 7.4 7.0  ALBUMIN 3.8 3.7   No results for input(s): LIPASE, AMYLASE in the last 168 hours. No results for input(s): AMMONIA in the last 168 hours. Coagulation profile No results for input(s): INR, PROTIME in the last 168 hours. COVID-19 Labs  No results for input(s): DDIMER, FERRITIN, LDH, CRP in the last 72 hours.  Lab Results  Component Value Date   SARSCOV2NAA NEGATIVE 01/07/2020   SARSCOV2NAA NEGATIVE 07/25/2019   SARSCOV2NAA NOT DETECTED 04/07/2019   SARSCOV2NAA NEGATIVE 11/21/2018    CBC: Recent Labs  Lab 01/17/20 0817 01/21/20 1234 01/22/20 0309  WBC 6.6 6.7 7.5  HGB 10.6* 11.9* 11.8*  HCT 34.4* 38.6* 38.5*  MCV 84.1 85.0 85.2  PLT 405* 436* 435*   Cardiac Enzymes: No results for input(s): CKTOTAL,  CKMB, CKMBINDEX, TROPONINI in the last 168 hours. BNP (last 3 results) No results for input(s): PROBNP in the last 8760 hours. CBG: No results for input(s): GLUCAP in the last 168 hours. D-Dimer: No results for input(s): DDIMER in the last 72 hours. Hgb A1c: No results for input(s): HGBA1C in the last 72 hours. Lipid Profile: No results  for input(s): CHOL, HDL, LDLCALC, TRIG, CHOLHDL, LDLDIRECT in the last 72 hours. Thyroid function studies: No results for input(s): TSH, T4TOTAL, T3FREE, THYROIDAB in the last 72 hours.  Invalid input(s): FREET3 Anemia work up: No results for input(s): VITAMINB12, FOLATE, FERRITIN, TIBC, IRON, RETICCTPCT in the last 72 hours. Sepsis Labs: Recent Labs  Lab 01/17/20 0817 01/21/20 1234 01/22/20 0309  WBC 6.6 6.7 7.5   Microbiology No results found for this or any previous visit (from the past 240 hour(s)).   Medications:   . benztropine  1 mg Oral Daily  . buPROPion  150 mg Oral Daily  . enoxaparin (LOVENOX) injection  40 mg Subcutaneous Q24H  . ferrous sulfate  325 mg Oral Q breakfast  . gabapentin  300 mg Oral BID  . lidocaine  1 patch Transdermal Q24H  . metoprolol succinate  25 mg Oral Daily  . mirtazapine  15 mg Oral QHS  . nicotine  14 mg Transdermal Daily  . pantoprazole  40 mg Oral Daily  . senna-docusate  1 tablet Oral BID  . sodium chloride flush  10-40 mL Intracatheter Q12H  . sodium chloride flush  10-40 mL Intracatheter Q12H  . sodium chloride flush  3 mL Intravenous Q12H  . vitamin B-12  100 mcg Oral Daily   Continuous Infusions: . sodium chloride Stopped (01/21/20 0228)  . ceFEPime (MAXIPIME) IV 2 g (01/22/20 0542)  . vancomycin 1,250 mg (01/22/20 0018)      LOS: 15 days   Marinda Elk  [facility]  01/22/2020, 9:45 AM

## 2020-01-23 DIAGNOSIS — M4626 Osteomyelitis of vertebra, lumbar region: Secondary | ICD-10-CM | POA: Diagnosis not present

## 2020-01-23 DIAGNOSIS — I10 Essential (primary) hypertension: Secondary | ICD-10-CM | POA: Diagnosis not present

## 2020-01-23 DIAGNOSIS — M4646 Discitis, unspecified, lumbar region: Secondary | ICD-10-CM | POA: Diagnosis not present

## 2020-01-23 DIAGNOSIS — F1410 Cocaine abuse, uncomplicated: Secondary | ICD-10-CM | POA: Diagnosis not present

## 2020-01-23 NOTE — Progress Notes (Signed)
[facility] PROGRESS NOTE    Progress Note  [name]  UUV:[id number] DOB: Sep 11, 1965 DOA: 01/06/2020 PCP: [name], MD     Brief Narrative:   [name] is an 54 y.o. male past medical history significant for discitis and 4/21 treated with 6 weeks of IV Vanco and Rocephin culture negative status post laminectomy schizophrenia ongoing cocaine abuse comes into the ED for shoulder and back pain after mechanical fall.  MRI showed septic arthritis of L3-L4 associated with phlegmon extending into T12-L1.  Neurosurgery was consulted who recommended IR guided biopsy and aspiration which was successful.  Assessment/Plan:   Acute osteomyelitis of lumbar spine (HCC) With a previous admission and discharge earlier this year for which he completed a 6 weeks of IV Rocephin and Vancomycin. Comes back in again with back pain Unsuccessful aspiration and biopsy by IR.  Culture data negative till date. Infectious disease was consulted recommended to continue IV vancomycin and cefepime. Continue narcotics for pain control.  Essential hypertension: Continue Toprol due to his history of cocaine abuse, started on Coreg low-dose tomorrow morning.  COPD uncomplicated: Continue nebulizers appears to be stable.  Schizophrenia: Dixon Boos every 3 months continue Cogentin Wellbutrin and Remeron.  Uncomplicated rib fracture: Continue symptomatic treatment.  Cocaine abuse (HCC) Noted, counseling.   DVT prophylaxis: lovenox Family Communication:none Status is: Inpatient  Remains inpatient appropriate because:IV treatments appropriate due to intensity of illness or inability to take PO   Dispo: The patient is from: Home              Anticipated d/c is to: Home              Anticipated d/c date is: > 3 days              Patient currently is not medically stable to d/c.        Code Status:     Code Status Orders  (From admission, onward)         Start      Ordered   01/07/20 1025  Full code  Continuous        01/07/20 1026        Code Status History    Date Active Date Inactive Code Status Order ID Comments User Context   07/26/2019 0425 09/05/2019 2002 Full Code [id number]  Marinda Elk, MD ED   04/11/2019 1444 04/13/2019 1720 Full Code [id number]  Kerrin Champagne, MD Inpatient   11/21/2018 0914 11/21/2018 2210 Full Code [id number]  Wynetta Fines, MD ED   01/28/2018 2012 01/31/2018 1935 Full Code [id number]  Charlsie Quest, MD ED   10/20/2017 1055 10/21/2017 2228 Full Code [id number]  Nadara Mustard, MD Inpatient   07/21/2016 1655 07/24/2016 1546 Full Code [id number]  Cammy Copa, MD Inpatient   02/18/2015 1941 02/21/2015 1635 Full Code [id number]  Alison Murray, MD Inpatient   03/01/2014 2211 03/05/2014 1902 Full Code [id number]  Assunta Found, NP Inpatient   03/01/2014 2150 03/01/2014 2211 Full Code [id number]  Assunta Found, NP Inpatient   02/28/2014 2106 03/01/2014 2150 Full Code [id number]  [facility] ED   12/04/2013 1221 12/07/2013 1505 Full Code [id number]  Alison Murray, MD ED   11/03/2013 1548 11/04/2013 1602 Full Code [id number]  Toy Cookey, MD ED   11/03/2013 1518 11/03/2013 1548 Full Code [id number]  Toy Cookey, MD ED   Advance Care Planning Activity        IV Access:  Peripheral IV   Procedures and diagnostic studies:   No results found.   Medical Consultants:    None.  Anti-Infectives:   IV vanc and cefepime  Subjective:    [name] no new complaints this morning.  Objective:    Vitals:   01/22/20 2116 01/23/20 0541 01/23/20 0901 01/23/20 0952  BP: 125/76 114/84 101/62 103/75  Pulse: 90 85 (!) 101 94  Resp:   16   Temp: 98.5 F (36.9 C) 98.4 F (36.9 C) 98.1 F (36.7 C) 98.5 F (36.9 C)  TempSrc: Oral Oral Oral Oral  SpO2: 99% 97% 99% 99%  Weight:      Height:       SpO2: 99 % O2 Flow Rate (L/min): 2 L/min   Intake/Output Summary (Last 24 hours) at 01/23/2020 1046 Last  data filed at 01/23/2020 0855 Gross per 24 hour  Intake 1641.82 ml  Output 400 ml  Net 1241.82 ml   Filed Weights   01/20/20 2036 01/21/20 2016 01/22/20 2100  Weight: 86.1 kg 86.5 kg 86.6 kg    Exam: General exam: In no acute distress. Respiratory system: Good air movement and clear to auscultation. Cardiovascular system: S1 & S2 heard, RRR. No JVD. Gastrointestinal system: Abdomen is nondistended, soft and nontender.  Extremities: No pedal edema. Skin: No rashes, lesions or ulcers Psychiatry: Judgement and insight appear normal. Mood & affect appropriate.  Data Reviewed:    Labs: Basic Metabolic Panel: Recent Labs  Lab 01/17/20 0817 01/17/20 0817 01/21/20 1234 01/22/20 0309  NA 137  --  137 138  K 4.1   < > 4.2 4.1  CL 106  --  101 103  CO2 22  --  27 25  GLUCOSE 110*  --  100* 94  BUN 13  --  13 14  CREATININE 1.00  --  1.03 1.16  CALCIUM 9.2  --  9.6 9.8  MG  --   --   --  2.0  PHOS  --   --   --  5.3*   < > = values in this interval not displayed.   GFR Estimated Creatinine Clearance: 79.4 mL/min (by C-G formula based on SCr of 1.16 mg/dL). Liver Function Tests: Recent Labs  Lab 01/21/20 1234 01/22/20 0309  AST 25 24  ALT 14 13  ALKPHOS 142* 147*  BILITOT 0.9 0.8  PROT 7.4 7.0  ALBUMIN 3.8 3.7   No results for input(s): LIPASE, AMYLASE in the last 168 hours. No results for input(s): AMMONIA in the last 168 hours. Coagulation profile No results for input(s): INR, PROTIME in the last 168 hours. COVID-19 Labs  No results for input(s): DDIMER, FERRITIN, LDH, CRP in the last 72 hours.  Lab Results  Component Value Date   SARSCOV2NAA NEGATIVE 01/07/2020   SARSCOV2NAA NEGATIVE 07/25/2019   SARSCOV2NAA NOT DETECTED 04/07/2019   SARSCOV2NAA NEGATIVE 11/21/2018    CBC: Recent Labs  Lab 01/17/20 0817 01/21/20 1234 01/22/20 0309  WBC 6.6 6.7 7.5  HGB 10.6* 11.9* 11.8*  HCT 34.4* 38.6* 38.5*  MCV 84.1 85.0 85.2  PLT 405* 436* 435*   Cardiac  Enzymes: No results for input(s): CKTOTAL, CKMB, CKMBINDEX, TROPONINI in the last 168 hours. BNP (last 3 results) No results for input(s): PROBNP in the last 8760 hours. CBG: No results for input(s): GLUCAP in the last 168 hours. D-Dimer: No results for input(s): DDIMER in the last 72 hours. Hgb A1c: No results for input(s): HGBA1C in the last 72 hours. Lipid  Profile: No results for input(s): CHOL, HDL, LDLCALC, TRIG, CHOLHDL, LDLDIRECT in the last 72 hours. Thyroid function studies: No results for input(s): TSH, T4TOTAL, T3FREE, THYROIDAB in the last 72 hours.  Invalid input(s): FREET3 Anemia work up: No results for input(s): VITAMINB12, FOLATE, FERRITIN, TIBC, IRON, RETICCTPCT in the last 72 hours. Sepsis Labs: Recent Labs  Lab 01/17/20 0817 01/21/20 1234 01/22/20 0309  WBC 6.6 6.7 7.5   Microbiology No results found for this or any previous visit (from the past 240 hour(s)).   Medications:   . benztropine  1 mg Oral Daily  . buPROPion  150 mg Oral Daily  . carvedilol  3.125 mg Oral BID WC  . enoxaparin (LOVENOX) injection  40 mg Subcutaneous Q24H  . ferrous sulfate  325 mg Oral Q breakfast  . gabapentin  300 mg Oral BID  . lidocaine  1 patch Transdermal Q24H  . mirtazapine  15 mg Oral QHS  . nicotine  14 mg Transdermal Daily  . pantoprazole  40 mg Oral Daily  . senna-docusate  1 tablet Oral BID  . sodium chloride flush  10-40 mL Intracatheter Q12H  . sodium chloride flush  10-40 mL Intracatheter Q12H  . sodium chloride flush  3 mL Intravenous Q12H  . vitamin B-12  100 mcg Oral Daily   Continuous Infusions: . sodium chloride Stopped (01/21/20 0228)  . ceFEPime (MAXIPIME) IV 2 g (01/23/20 1610)  . vancomycin 1,000 mg (01/23/20 0305)      LOS: 16 days   Marinda Elk  [facility]  01/23/2020, 10:46 AM

## 2020-01-23 NOTE — Progress Notes (Signed)
Lab made aware to draw Vanc level at 1100 today. No blood return Midline.

## 2020-01-23 NOTE — Plan of Care (Signed)
  Problem: Elimination: Goal: Will not experience complications related to bowel motility Outcome: Completed/Met

## 2020-01-23 NOTE — Progress Notes (Signed)
Occupational Therapy Treatment Patient Details Name: [name] MRN: [id number] DOB: February 19, 1966 Today's Date: 01/23/2020    History of present illness Pt is a 54 y/o male with PMH of discitis April 2021, s/p laminectormy, schizophrenia, cocaine use, presenting with mechanical fall in the shower with shoulder and back pain. Imaging reveals: progresive discitits osteomyelitis of LS spine, Septic arthriits at L3-4, spinal stenosis L1-2 and improved discitis at L1-2.  Underwent apsiration/core biopsy 10/11, neurosurgery recommends medical tx. Acute non displaced Fx of anterolateral L 9th rib; R shoulder with no acute findings.    OT comments  Patient completing mobility and ADLs with supervision, continues to require min cueing for safety and fall prevention. Reviewed fall prevention techniques with poor recall and would benefit from handout to optimize recall.  Functional mobility improved with 1 UE support using IV, as pt reaching out for UE support during mobility in room and hallway.  He reports pain in back initally but no complaints during session.  Will follow acutely, decreased frequency to 1x/week.    Follow Up Recommendations  No OT follow up;Supervision - Intermittent    Equipment Recommendations  3 in 1 bedside commode    Recommendations for Other Services      Precautions / Restrictions Precautions Precautions: None Restrictions Weight Bearing Restrictions: No       Mobility Bed Mobility Overal bed mobility: Independent                Transfers Overall transfer level: Needs assistance       Stand pivot transfers: Supervision       General transfer comment: for safety    Balance Overall balance assessment: Mild deficits observed, not formally tested                             High Level Balance Comments: pt reaching for UE support in room           ADL either performed or assessed with clinical judgement   ADL Overall ADL's :  Needs assistance/impaired     Grooming: Wash/dry hands;Supervision/safety;Standing               Lower Body Dressing: Supervision/safety;Sit to/from stand Lower Body Dressing Details (indicate cue type and reason): for safety, discussed safety and completion seated  Toilet Transfer: Supervision/safety;Ambulation Toilet Transfer Details (indicate cue type and reason): simulated in room          Functional mobility during ADLs: Supervision/safety;Cueing for safety       Vision   Vision Assessment?: No apparent visual deficits   Perception     Praxis      Cognition Arousal/Alertness: Awake/alert Behavior During Therapy: WFL for tasks assessed/performed Overall Cognitive Status: Within Functional Limits for tasks assessed                                 General Comments: Decreased safety awareness, decreased recall of fall prevention techniques         Exercises     Shoulder Instructions       General Comments reviewed fall prevention techniques in beg/end of session, poor recall and would benefit from handout     Pertinent Vitals/ Pain       Pain Assessment: Faces Pain Score: 8  Faces Pain Scale: Hurts little more Pain Location: back Pain Descriptors / Indicators: Aching Pain Intervention(s): Monitored during session;Repositioned;Limited activity  within patient's tolerance  Home Living                                          Prior Functioning/Environment              Frequency  Min 1X/week        Progress Toward Goals  OT Goals(current goals can now be found in the care plan section)  Progress towards OT goals: Progressing toward goals  Acute Rehab OT Goals Patient Stated Goal: to return home OT Goal Formulation: With patient  Plan Frequency needs to be updated;Discharge plan remains appropriate    Co-evaluation                 AM-PAC OT "6 Clicks" Daily Activity     Outcome Measure   Help  from another person eating meals?: None Help from another person taking care of personal grooming?: None Help from another person toileting, which includes using toliet, bedpan, or urinal?: A Little Help from another person bathing (including washing, rinsing, drying)?: A Little Help from another person to put on and taking off regular upper body clothing?: None Help from another person to put on and taking off regular lower body clothing?: None 6 Click Score: 22    End of Session    OT Visit Diagnosis: Other abnormalities of gait and mobility (R26.89);Pain Pain - part of body:  (back)   Activity Tolerance Patient tolerated treatment well   Patient Left with call bell/phone within reach;Other (comment);with nursing/sitter in room (seated EOB )   Nurse Communication Mobility status        Time: 1610-9604 OT Time Calculation (min): 14 min  Charges: OT General Charges $OT Visit: 1 Visit OT Treatments $Self Care/Home Management : 8-22 mins  Barry Brunner, OT Acute Rehabilitation Services Pager [phone] Office [phone]    Chancy Milroy 01/23/2020, 3:13 PM

## 2020-01-24 LAB — BASIC METABOLIC PANEL
Anion gap: 10 (ref 5–15)
BUN: 17 mg/dL (ref 6–20)
CO2: 23 mmol/L (ref 22–32)
Calcium: 9.4 mg/dL (ref 8.9–10.3)
Chloride: 102 mmol/L (ref 98–111)
Creatinine, Ser: 0.87 mg/dL (ref 0.61–1.24)
GFR, Estimated: >60 mL/min (ref >60)
Glucose, Bld: 93 mg/dL (ref 70–99)
Potassium: 4.1 mmol/L (ref 3.5–5.1)
Sodium: 135 mmol/L (ref 135–145)

## 2020-01-24 NOTE — Progress Notes (Signed)
Patient seen and examined, remains inpatient for completion of 6-week course of IV antibiotics for recurrent osteomyelitis of lumbar spine -Stable -Continue current antibiotics -Encouraged ambulation  Domenic Polite, MD

## 2020-01-24 NOTE — Progress Notes (Signed)
Physical Therapy Treatment Patient Details Name: MERCED GURRIERI MRN: [id number] DOB: 1965-09-16 Today's Date: 01/24/2020    History of Present Illness Pt is a 54 y/o male with PMH of discitis April 2021, s/p laminectormy, schizophrenia, cocaine use, presenting with mechanical fall in the shower with shoulder and back pain. Imaging reveals: progresive discitits osteomyelitis of LS spine, Septic arthriits at L3-4, spinal stenosis L1-2 and improved discitis at L1-2.  Underwent apsiration/core biopsy 10/11, neurosurgery recommends medical tx. Acute non displaced Fx of anterolateral L 9th rib; R shoulder with no acute findings.     PT Comments    Pt agreeable to ambulation; pt mod I initially but with continued ambulation S due to drifting and decreased awareness of obstacles; pt very fatigued following ambulation; pt will benefit from PT to increase I with ambulation for increased distances and progress high level balance activities prior to discharge     Follow Up Recommendations  No PT follow up     Equipment Recommendations  None recommended by PT    Recommendations for Other Services       Precautions / Restrictions      Mobility  Bed Mobility Overal bed mobility: Independent                Transfers Overall transfer level: Modified independent Equipment used: None Transfers: Sit to/from Stand Sit to Stand: Modified independent (Device/Increase time) Stand pivot transfers: Modified independent (Device/Increase time)          Ambulation/Gait Ambulation/Gait assistance: Modified independent (Device/Increase time);Supervision Gait Distance (Feet): 10000 Feet     Gait velocity: mildly decreased   General Gait Details: Slightly increased antalgic gait with increased distance. No gross imbalance, drifts to the R; pt requiring S at the end of the walk for safety to avoid obstacles   Stairs             Wheelchair Mobility    Modified Rankin (Stroke  Patients Only)       Balance                                            Cognition                                              Exercises      General Comments        Pertinent Vitals/Pain      Home Living                      Prior Function            PT Goals (current goals can now be found in the care plan section) Acute Rehab PT Goals Patient Stated Goal: to return home PT Goal Formulation: With patient Time For Goal Achievement: 01/23/20 Potential to Achieve Goals: Good Progress towards PT goals: Progressing toward goals    Frequency    Min 1X/week      PT Plan Current plan remains appropriate    Co-evaluation              AM-PAC PT "6 Clicks" Mobility   Outcome Measure  Help needed turning from your back to your side while in a flat bed without using bedrails?: None Help  needed moving from lying on your back to sitting on the side of a flat bed without using bedrails?: None Help needed moving to and from a bed to a chair (including a wheelchair)?: None Help needed standing up from a chair using your arms (e.g., wheelchair or bedside chair)?: None Help needed to walk in hospital room?: None Help needed climbing 3-5 steps with a railing? : A Little 6 Click Score: 23    End of Session Equipment Utilized During Treatment: Gait belt Activity Tolerance: Patient tolerated treatment well Patient left: in bed;with call bell/phone within reach Nurse Communication: Mobility status PT Visit Diagnosis: Unsteadiness on feet (R26.81)     Time: 3474-2595 PT Time Calculation (min) (ACUTE ONLY): 13 min  Charges:  $Gait Training: 8-22 mins                     Ginette Otto, DPT Acute Rehabilitation Services [phone]   Lucretia Field 01/24/2020, 1:37 PM

## 2020-01-25 DIAGNOSIS — M4626 Osteomyelitis of vertebra, lumbar region: Secondary | ICD-10-CM | POA: Diagnosis not present

## 2020-01-25 LAB — VANCOMYCIN, TROUGH: Vancomycin Tr: 17 ug/mL (ref 15–20)

## 2020-01-25 MED ORDER — METOPROLOL SUCCINATE ER 25 MG PO TB24
12.5000 mg | ORAL_TABLET | Freq: Every day | ORAL | Status: DC
  Administered 2020-01-26 – 2020-02-20 (×26): 12.5 mg via ORAL
  Filled 2020-01-25 (×27): qty 1

## 2020-01-25 NOTE — Plan of Care (Signed)
  Problem: Health Behavior/Discharge Planning: Goal: Ability to manage health-related needs will improve Outcome: Progressing   Problem: Clinical Measurements: Goal: Will remain free from infection Outcome: Progressing   Problem: Pain Managment: Goal: General experience of comfort will improve Outcome: Progressing   

## 2020-01-25 NOTE — Progress Notes (Signed)
Pharmacy Antibiotic Note  [name] is a 54 y.o. male admitted on 01/06/2020 with prior history of lumbar osteomyelitis/discitis in May-June 2021 now with back pain post fall at home and MRI showing lumbar osteomyelitis.  Neurosurgery unable to get successful disc aspiration during biopsy. Patient with history of L ankle Enterobacter infection s/p hardware removal in 2019; ortho saw patient here, possible chronic vs advancing osteo. Pharmacy has been consulted for Vancomycin and Cefepime dosing to cover for Enterobacter.  Plan is for 6 wks of IV abx (Vanc/Cefepime, thru 02/18/20), then consolidate to PO doxycycline and cephalexin for another month.   Vanc dose adjusted 10/25 with trough 21 mcg/ml (goal 15-20). Vanc trough is 17 mcg/ml today on 1gm IV q12h. Now at goal.    Creatinine fluctuating some, trended down some 10/27, BUN up a little.  Plan: Continue Vancomycin 1 gm IV q12hr. Continue Cefepime 2 gm IV q8hr. Bmet at least q72h while on Vanc. Next 10/30. Vanc trough weekly, or as indicated.  Target vanc troughs 15-20 mcg/ml.  Height: 5\' 9"  (175.3 cm) Weight: 88.7 kg (195 lb 9.6 oz) IBW/kg (Calculated) : 70.7  Temp (24hrs), Avg:98 F (36.7 C), Min:97.5 F (36.4 C), Max:98.3 F (36.8 C)  Recent Labs  Lab 01/21/20 1234 01/22/20 0309 01/22/20 1232 01/24/20 0146 01/25/20 0329  WBC 6.7 7.5  --   --   --   CREATININE 1.03 1.16  --  0.87  --   VANCOTROUGH  --   --  21*  --  17    Estimated Creatinine Clearance: 107 mL/min (by C-G formula based on SCr of 0.87 mg/dL).    Allergies  Allergen Reactions  . Chlorhexidine Itching and Rash    Antimicrobials this admission: Ceftriaxone 10/11 >> 10/14 Vancomycin 10/11 >> (11/21) Cefepime 10/14 >> (11/21)  Dose adjustments / vancomycin trough levels: 10/15 VT = 22 mcg/ml but dose prior PM dose was given ~4 hours late so true trough ~15 mcg/ml (therapeutic) on 1500 mg IV q12h> no change 10/21 VT = 23 mcg/ml , drawn ~50 min  early, on 1500 mg IV q12h > decr to 1250 mg IV q12h 10/25 VT= 21 mcg/ml - on 1250 mg IV q12h > decr to 1gm IV q12h 10/28 VT = 17 mcg/ml - continue 1gm IV q12h   Microbiology results: 10/11 fluid, disc L1 cx: negative 10/11 Blood x 2: negative 10/10 COVID, flu: neg  Arty Baumgartner, [place] Phone: [phone] 01/25/2020 2:47 PM

## 2020-01-25 NOTE — Progress Notes (Signed)
PROGRESS NOTE    [name]  AVW:[id number] DOB: [birth year] DOA: 01/06/2020 PCP: [name], MD   Brief Narrative: 54 year old with past medical history significant for discitis (April 2021 treated with 6 weeks of vancomycin and ceftriaxone, culture unrevealing), status post laminectomy, schizophrenia and ongoing cocaine use who presented to ED complaining of shoulder pain and back pain after a mechanical fall 4 days prior to admission. -Evaluation in the ED imaging noted isolated rib fracture without pneumothorax, LS-spine showed possible progressive discitis osteomyelitis.  MRI was performed which shows possible septic arthritis at L3-4 , associated epidural phlegmon and enhancement extending from T12 -L1.  Resultant moderate to severe diffuse spinal stenosis extending from L1 -L2.improved discitis at L1-2.  -Patient was evaluated by neurosurgery, who commended medical treatment and IR guided disc space biopsy to guide antibiotics. Attempt at aspiration unsuccessful, now on broad spectrum Abx for 6 weeks   Assessment & Plan:   Recurrent discitis/osteomyelitis of L3-4, associated epidural phlegmon: -Previously with negative cultures, completed 6-week course of IV ceftriaxone and vancomycin in early June inpatient. -IR attempted disc aspiration and biopsy, unsuccessful due to severe disc space narrowing and overhanging degenerative osteophytes -Blood cultures are negative -Appreciate infectious disease input -Initially treated with IV ceftriaxone and vancomycin, ceftriaxone changed to cefepime 10/14, vancomycin continued -MRI of left foot cannot rule out chronic osteomyelitis, no clinical signs of infection on exam previously had hardware infection which was removed in 2019, appreciate Dr. Audrie Lia input -Stable, continue Flexeril, lidocaine patch, oxycodone -Continue IV vancomycin to complete 6 week on 11/21  HTN;   -Stable, changed coreg to low dose toprol  History of ongoing  cocaine, prior ETOH use; Quit alcohol 5 years ago.  -Reports using cocaine 1-2 times a week, adderall and heroin occasionally -Counseled  COPD; PRN nebulizer.  Nicotine patch  Schizophrenia;  On Invega Q3 months.  Continue with Cogentin, Wellbutrin, Remeron.   GERD; continue PPI.    Mild Hyponatremia;  -resolved  Anemia of chronic disease -Monitor  Acute non displaced Fracture -left ninth Rib;  - Incentive spirometry.   Estimated body mass index is 28.89 kg/m as calculated from the following:   Height as of this encounter: 5\' 9"  (1.753 m).   Weight as of this encounter: 88.7 kg.   DVT prophylaxis: Lovenox Code Status: Full code Family Communication: Discussed with patient  Disposition Plan:  Status is: Inpatient  Remains inpatient appropriate because: Prolonged IV antibiotics needed  Dispo: The patient is from: Home              Anticipated d/c is to: to be determined              Anticipated d/c date is: after 6 weeks of IV Abx, last day of antibiotics 11/21              Patient currently is not medically stable to d/c.  Consultants:   IR  ID  Neurosurgery.   Procedures:   Disc aspiration Lumbar spine.   Antimicrobials:    Subjective: Feels okay, some lower back discomfort, ambulating  Objective: Vitals:   01/24/20 1655 01/24/20 2052 01/25/20 0530 01/25/20 0851  BP: 134/86 138/74 (!) 136/97 118/89  Pulse: 97 (!) 109 95 91  Resp: 18 19 18 18   Temp: (!) 97.5 F (36.4 C) 98.2 F (36.8 C) 98 F (36.7 C) 98.3 F (36.8 C)  TempSrc: Oral Oral Oral Oral  SpO2: 100% 100% 99% 94%  Weight:  88.7 kg    Height:  Intake/Output Summary (Last 24 hours) at 01/25/2020 1433 Last data filed at 01/25/2020 1300 Gross per 24 hour  Intake 1540 ml  Output 0 ml  Net 1540 ml   Filed Weights   01/22/20 2100 01/23/20 2039 01/24/20 2052  Weight: 86.6 kg 88.2 kg 88.7 kg    Examination:  General, Pleasant male laying in bed, AAOx3, no distress CVS:  S1-S2, regular rate rhythm Lungs: Decreased breath sounds to bases, otherwise clear Abdomen: Soft, nontender, bowel sounds present Extremities: No edema Skin: No rashes on exposed skin   Data Reviewed: I have personally reviewed following labs and imaging studies  CBC: Recent Labs  Lab 01/21/20 1234 01/22/20 0309  WBC 6.7 7.5  HGB 11.9* 11.8*  HCT 38.6* 38.5*  MCV 85.0 85.2  PLT 436* 435*   Basic Metabolic Panel: Recent Labs  Lab 01/21/20 1234 01/22/20 0309 01/24/20 0146  NA 137 138 135  K 4.2 4.1 4.1  CL 101 103 102  CO2 27 25 23   GLUCOSE 100* 94 93  BUN 13 14 17   CREATININE 1.03 1.16 0.87  CALCIUM 9.6 9.8 9.4  MG  --  2.0  --   PHOS  --  5.3*  --    GFR: Estimated Creatinine Clearance: 107 mL/min (by C-G formula based on SCr of 0.87 mg/dL). Liver Function Tests: Recent Labs  Lab 01/21/20 1234 01/22/20 0309  AST 25 24  ALT 14 13  ALKPHOS 142* 147*  BILITOT 0.9 0.8  PROT 7.4 7.0  ALBUMIN 3.8 3.7   No results for input(s): LIPASE, AMYLASE in the last 168 hours. No results for input(s): AMMONIA in the last 168 hours. Coagulation Profile: No results for input(s): INR, PROTIME in the last 168 hours. Cardiac Enzymes: No results for input(s): CKTOTAL, CKMB, CKMBINDEX, TROPONINI in the last 168 hours. BNP (last 3 results) No results for input(s): PROBNP in the last 8760 hours. HbA1C: No results for input(s): HGBA1C in the last 72 hours. CBG: No results for input(s): GLUCAP in the last 168 hours. Lipid Profile: No results for input(s): CHOL, HDL, LDLCALC, TRIG, CHOLHDL, LDLDIRECT in the last 72 hours. Thyroid Function Tests: No results for input(s): TSH, T4TOTAL, FREET4, T3FREE, THYROIDAB in the last 72 hours. Anemia Panel: No results for input(s): VITAMINB12, FOLATE, FERRITIN, TIBC, IRON, RETICCTPCT in the last 72 hours. Sepsis Labs: No results for input(s): PROCALCITON, LATICACIDVEN in the last 168 hours.  No results found for this or any previous  visit (from the past 240 hour(s)).   Scheduled Meds: . benztropine  1 mg Oral Daily  . buPROPion  150 mg Oral Daily  . carvedilol  3.125 mg Oral BID WC  . enoxaparin (LOVENOX) injection  40 mg Subcutaneous Q24H  . ferrous sulfate  325 mg Oral Q breakfast  . gabapentin  300 mg Oral BID  . lidocaine  1 patch Transdermal Q24H  . mirtazapine  15 mg Oral QHS  . nicotine  14 mg Transdermal Daily  . pantoprazole  40 mg Oral Daily  . senna-docusate  1 tablet Oral BID  . sodium chloride flush  3 mL Intravenous Q12H  . vitamin B-12  100 mcg Oral Daily   Continuous Infusions: . sodium chloride Stopped (01/21/20 0228)  . ceFEPime (MAXIPIME) IV 2 g (01/25/20 0630)  . vancomycin 1,000 mg (01/25/20 0447)     LOS: 18 days   Time spent: 25 minutes.   Zannie Cove, MD [facility]  01/25/2020, 2:33 PM

## 2020-01-26 NOTE — Plan of Care (Signed)
  Problem: Health Behavior/Discharge Planning: Goal: Ability to manage health-related needs will improve Outcome: Progressing   Problem: Clinical Measurements: Goal: Will remain free from infection Outcome: Progressing   

## 2020-01-26 NOTE — Progress Notes (Signed)
Patient seen and examined, sitting up in bed talking on the phone -Continue IV cefepime/vancomycin to complete 6-week course of antibiotics for recurrent osteomyelitis of lumbar spine -Continue oral morphine sulfate and Flexeril  Domenic Polite, MD

## 2020-01-27 DIAGNOSIS — M4626 Osteomyelitis of vertebra, lumbar region: Secondary | ICD-10-CM | POA: Diagnosis not present

## 2020-01-27 LAB — BASIC METABOLIC PANEL
Anion gap: 9 (ref 5–15)
BUN: 8 mg/dL (ref 6–20)
CO2: 27 mmol/L (ref 22–32)
Calcium: 9.3 mg/dL (ref 8.9–10.3)
Chloride: 102 mmol/L (ref 98–111)
Creatinine, Ser: 1.04 mg/dL (ref 0.61–1.24)
GFR, Estimated: >60 mL/min (ref >60)
Glucose, Bld: 108 mg/dL — ABNORMAL HIGH (ref 70–99)
Potassium: 4.4 mmol/L (ref 3.5–5.1)
Sodium: 138 mmol/L (ref 135–145)

## 2020-01-27 MED ORDER — LIP MEDEX EX OINT
TOPICAL_OINTMENT | CUTANEOUS | Status: DC | PRN
  Filled 2020-01-27: qty 7

## 2020-01-27 NOTE — Progress Notes (Signed)
PROGRESS NOTE    [name]  WUJ:[id number] DOB: 1965-04-19 DOA: 01/06/2020 PCP: [name], MD   Brief Narrative: 54/M w/ past medical history significant for discitis (April 2021 treated with 6 weeks of vancomycin and ceftriaxone, culture unrevealing), status post laminectomy, schizophrenia and ongoing cocaine use who presented to ED complaining of shoulder pain and back pain after a mechanical fall 4 days prior to admission. -Evaluation in the ED imaging noted isolated rib fracture , LS-spine showed possible progressive discitis osteomyelitis.  MRI was performed which shows possible septic arthritis at L3-4 , associated epidural phlegmon and enhancement extending from T12 -L1.  Resultant moderate to severe diffuse spinal stenosis extending from L1 -L2.improved discitis at L1-2.  -Patient was evaluated by neurosurgery, who commended medical treatment and IR guided disc space biopsy to guide antibiotics. Attempt at aspiration unsuccessful, now on broad spectrum Abx for 6 weeks   Assessment & Plan:   Recurrent discitis/osteomyelitis of L3-4, associated epidural phlegmon: -Previously with negative cultures, completed 6-week course of IV ceftriaxone and vancomycin in early June inpatient. -IR attempted disc aspiration and biopsy, unsuccessful due to severe disc space narrowing and overhanging degenerative osteophytes -Blood cultures are negative -Appreciate infectious disease input -Initially treated with IV ceftriaxone and vancomycin, ceftriaxone changed to cefepime 10/14, vancomycin continued -MRI of left foot cannot rule out chronic osteomyelitis, no clinical signs of infection on exam previously had hardware infection which was removed in 2019, appreciate Dr. Audrie Lia input -continue Flexeril, lidocaine patch, oxycodone -Continue IV vancomycin to complete 6 week on 11/21 -Stable, encouraged ambulation  HTN;   -Stable, changed coreg to low dose toprol  History of ongoing cocaine,  prior ETOH use; Quit alcohol 5 years ago.  -Reports using cocaine 1-2 times a week, adderall and heroin occasionally -Counseled  COPD; PRN nebulizer.  Nicotine patch  Schizophrenia;  On Invega Q3 months.  Continue with Cogentin, Wellbutrin, Remeron.   GERD; continue PPI.    Mild Hyponatremia;  -resolved  Anemia of chronic disease -Monitor  Acute non displaced Fracture -left ninth Rib;  - Incentive spirometry.   Estimated body mass index is 28.89 kg/m as calculated from the following:   Height as of this encounter: 5\' 9"  (1.753 m).   Weight as of this encounter: 88.7 kg.   DVT prophylaxis: Lovenox Code Status: Full code Family Communication: Discussed with patient  Disposition Plan:  Status is: Inpatient  Remains inpatient appropriate because: Prolonged IV antibiotics needed  Dispo: The patient is from: Home              Anticipated d/c is to: to be determined              Anticipated d/c date is: after 6 weeks of IV Abx, last day of antibiotics 11/21              Patient currently is not medically stable to d/c.  Consultants:   IR  ID  Neurosurgery.   Procedures:   Disc aspiration Lumbar spine.   Antimicrobials:    Subjective: Complains of mild lower back discomfort, ambulating and watches TV  Objective: Vitals:   01/26/20 1736 01/26/20 2015 01/27/20 0456 01/27/20 0919  BP: (!) 128/98 (!) 128/95 109/82 101/67  Pulse: (!) 105 93 82 92  Resp: 20 18 17 18   Temp: 98.7 F (37.1 C) 98.8 F (37.1 C) 98.6 F (37 C) 98.8 F (37.1 C)  TempSrc: Oral Oral Oral Oral  SpO2: 99% 100% 98% 98%  Weight:  Height:        Intake/Output Summary (Last 24 hours) at 01/27/2020 1348 Last data filed at 01/27/2020 0900 Gross per 24 hour  Intake 1380 ml  Output 0 ml  Net 1380 ml   Filed Weights   01/22/20 2100 01/23/20 2039 01/24/20 2052  Weight: 86.6 kg 88.2 kg 88.7 kg    Examination:  Pleasant male sitting up in bed, AAOx3, no distress CVS: S1-S2,  regular rate rhythm Lungs: Decreased breath sounds the bases, otherwise clear Abdomen: Soft, mildly distended, nontender, bowel sounds present Extremities: No edema  Skin: No rashes on exposed skin   Data Reviewed: I have personally reviewed following labs and imaging studies  CBC: Recent Labs  Lab 01/21/20 1234 01/22/20 0309  WBC 6.7 7.5  HGB 11.9* 11.8*  HCT 38.6* 38.5*  MCV 85.0 85.2  PLT 436* 435*   Basic Metabolic Panel: Recent Labs  Lab 01/21/20 1234 01/22/20 0309 01/24/20 0146 01/27/20 0602  NA 137 138 135 138  K 4.2 4.1 4.1 4.4  CL 101 103 102 102  CO2 27 25 23 27   GLUCOSE 100* 94 93 108*  BUN 13 14 17 8   CREATININE 1.03 1.16 0.87 1.04  CALCIUM 9.6 9.8 9.4 9.3  MG  --  2.0  --   --   PHOS  --  5.3*  --   --    GFR: Estimated Creatinine Clearance: 89.5 mL/min (by C-G formula based on SCr of 1.04 mg/dL). Liver Function Tests: Recent Labs  Lab 01/21/20 1234 01/22/20 0309  AST 25 24  ALT 14 13  ALKPHOS 142* 147*  BILITOT 0.9 0.8  PROT 7.4 7.0  ALBUMIN 3.8 3.7   No results for input(s): LIPASE, AMYLASE in the last 168 hours. No results for input(s): AMMONIA in the last 168 hours. Coagulation Profile: No results for input(s): INR, PROTIME in the last 168 hours. Cardiac Enzymes: No results for input(s): CKTOTAL, CKMB, CKMBINDEX, TROPONINI in the last 168 hours. BNP (last 3 results) No results for input(s): PROBNP in the last 8760 hours. HbA1C: No results for input(s): HGBA1C in the last 72 hours. CBG: No results for input(s): GLUCAP in the last 168 hours. Lipid Profile: No results for input(s): CHOL, HDL, LDLCALC, TRIG, CHOLHDL, LDLDIRECT in the last 72 hours. Thyroid Function Tests: No results for input(s): TSH, T4TOTAL, FREET4, T3FREE, THYROIDAB in the last 72 hours. Anemia Panel: No results for input(s): VITAMINB12, FOLATE, FERRITIN, TIBC, IRON, RETICCTPCT in the last 72 hours. Sepsis Labs: No results for input(s): PROCALCITON, LATICACIDVEN in  the last 168 hours.  No results found for this or any previous visit (from the past 240 hour(s)).   Scheduled Meds: . benztropine  1 mg Oral Daily  . buPROPion  150 mg Oral Daily  . enoxaparin (LOVENOX) injection  40 mg Subcutaneous Q24H  . ferrous sulfate  325 mg Oral Q breakfast  . gabapentin  300 mg Oral BID  . lidocaine  1 patch Transdermal Q24H  . metoprolol succinate  12.5 mg Oral Daily  . mirtazapine  15 mg Oral QHS  . nicotine  14 mg Transdermal Daily  . pantoprazole  40 mg Oral Daily  . senna-docusate  1 tablet Oral BID  . sodium chloride flush  3 mL Intravenous Q12H  . vitamin B-12  100 mcg Oral Daily   Continuous Infusions: . sodium chloride Stopped (01/21/20 0228)  . ceFEPime (MAXIPIME) IV 2 g (01/27/20 1331)  . vancomycin 1,000 mg (01/27/20 0439)  LOS: 20 days   Time spent: 25 minutes.   Zannie Cove, MD [facility]  01/27/2020, 1:48 PM

## 2020-01-28 DIAGNOSIS — M4626 Osteomyelitis of vertebra, lumbar region: Secondary | ICD-10-CM | POA: Diagnosis not present

## 2020-01-28 MED ORDER — SACCHAROMYCES BOULARDII 250 MG PO CAPS
250.0000 mg | ORAL_CAPSULE | Freq: Two times a day (BID) | ORAL | Status: DC
  Administered 2020-01-28 – 2020-02-20 (×47): 250 mg via ORAL
  Filled 2020-01-28 (×47): qty 1

## 2020-01-28 NOTE — Progress Notes (Signed)
Pharmacy Antibiotic Note  [name] is a 54 y.o. male admitted on 01/06/2020 with prior history of lumbar osteomyelitis/discitis in May-June 2021 now with back pain post fall at home and MRI showing lumbar osteomyelitis.  Neurosurgery unable to get successful disc aspiration during biopsy. Patient with history of L ankle Enterobacter infection s/p hardware removal in 2019; ortho saw patient here, possible chronic vs advancing osteo. Pharmacy has been consulted for Vancomycin and Cefepime dosing to cover for Enterobacter.  Plan is for 6 wks of IV abx (Vanc/Cefepime, thru 02/18/20), then consolidate to PO doxycycline and cephalexin for another month.   Vanc dose adjusted 10/25 with trough 21 mcg/ml (goal 15-20). On 10/28, Vanc trough met goal at 17 mcg/ml on 1gm IV q12h    Creatinine fluctuating some, trended down some 10/27, BUN decreased.  Plan: Continue Vancomycin 1 gm IV q12hr. Continue Cefepime 2 gm IV q8hr. Bmet at least q72h while on Vanc. Next 11/2. Vanc trough weekly, or as indicated.  Target vanc troughs 15-20 mcg/ml.  Height: _0  (175.3 cm) Weight: 90.3 kg (199 lb 1.2 oz) IBW/kg (Calculated) : 70.7  Temp (24hrs), Avg:98.1 F (36.7 C), Min:97.5 F (36.4 C), Max:98.9 F (37.2 C)  Recent Labs  Lab 01/22/20 0309 01/22/20 1232 01/24/20 0146 01/25/20 0329 01/27/20 0602  WBC 7.5  --   --   --   --   CREATININE 1.16  --  0.87  --  1.04  VANCOTROUGH  --  21*  --  17  --     Estimated Creatinine Clearance: 90.2 mL/min (by C-G formula based on SCr of 1.04 mg/dL).    Allergies  Allergen Reactions  . Chlorhexidine Itching and Rash    Antimicrobials this admission: Ceftriaxone 10/11 >> 10/14 Vancomycin 10/11 >> (11/21) Cefepime 10/14 >> (11/21)  Dose adjustments / vancomycin trough levels: 10/15 VT = 22 mcg/ml but dose prior PM dose was given ~4 hours late so true trough ~15 mcg/ml (therapeutic) on 1500 mg IV q12h> no change 10/21 VT = 23 mcg/ml , drawn ~50 min  early, on 1500 mg IV q12h > decr to 1250 mg IV q12h 10/25 VT= 21 mcg/ml - on 1250 mg IV q12h > decr to 1gm IV q12h 10/28 VT = 17 mcg/ml - continue 1gm IV q12h   Microbiology results: 10/11 fluid, disc L1 cx: negative 10/11 Blood x 2: negative 10/10 COVID, flu: neg  [name] P. Legrand Como, PharmD, [place] Please utilize [facility] for appropriate phone number to reach the unit pharmacist ([place]) 01/28/2020 1:16 PM

## 2020-01-28 NOTE — Progress Notes (Signed)
PROGRESS NOTE    [name]  [id number]:[id number] DOB: Jul 30, 1965 DOA: 01/06/2020 PCP: [name], MD   Brief Narrative: 54/M w/ past medical history significant for discitis (April 2021 treated with 6 weeks of vancomycin and ceftriaxone, culture unrevealing), status post laminectomy, schizophrenia and ongoing cocaine use who presented to ED complaining of shoulder pain and back pain after a mechanical fall 4 days prior to admission. -Evaluation in the ED imaging noted isolated rib fracture , LS-spine showed possible progressive discitis osteomyelitis.  MRI was performed which shows possible septic arthritis at L3-4 , associated epidural phlegmon and enhancement extending from T12 -L1.  Resultant moderate to severe diffuse spinal stenosis extending from L1 -L2.improved discitis at L1-2.  -Patient was evaluated by neurosurgery, who commended medical treatment and IR guided disc space biopsy to guide antibiotics. Attempt at aspiration unsuccessful, now on broad spectrum Abx for 6 weeks   Assessment & Plan:   Recurrent discitis/osteomyelitis of L3-4, associated epidural phlegmon: -Previously with negative cultures, completed 6-week course of IV ceftriaxone and vancomycin in early June inpatient. -IR attempted disc aspiration and biopsy, unsuccessful due to severe disc space narrowing and overhanging degenerative osteophytes -Appreciate infectious disease input, no culture data to guide antibiotic therapy -Initially treated with IV ceftriaxone and vancomycin, ceftriaxone changed to cefepime 10/14, vancomycin continued -continue Flexeril, lidocaine patch, oxycodone -Continue IV vancomycin to complete 6-week course on 10/21 -Encouraged increased ambulation   HTN;   -Stable, changed coreg to low dose toprol  History of ongoing cocaine, prior ETOH use; Quit alcohol 5 years ago.  -Reports using cocaine 1-2 times a week, adderall and heroin occasionally -Counseled  COPD; PRN nebulizer.    Nicotine patch  Schizophrenia;  On Invega Q3 months.  Continue with Cogentin, Wellbutrin, Remeron.   GERD; continue PPI.    Mild Hyponatremia;  -resolved  Anemia of chronic disease -Monitor  Acute non displaced Fracture -left ninth Rib;  - Incentive spirometry.   Estimated body mass index is 29.4 kg/m as calculated from the following:   Height as of this encounter: 5\' 9"  (1.753 m).   Weight as of this encounter: 90.3 kg.   DVT prophylaxis: Lovenox Code Status: Full code Family Communication: Discussed with patient in detail  disposition Plan:  Status is: Inpatient  Remains inpatient appropriate because: Prolonged IV antibiotics needed  Dispo: The patient is from: Home              Anticipated d/c is to: to be determined              Anticipated d/c date is: after 6 weeks of IV Abx, last day of antibiotics 11/21              Patient currently is not medically stable to d/c.  Consultants:   IR  ID  Neurosurgery.   Procedures:   Disc aspiration Lumbar spine.   Antimicrobials:    Subjective: -Sitting up in bed watching TV, reports lower back discomfort, ambulating okay  Objective: Vitals:   01/27/20 1649 01/27/20 2024 01/28/20 0444 01/28/20 0828  BP: (!) 127/92 126/89 108/80 113/76  Pulse: 96 87 92 100  Resp: 18 18  18   Temp: 98.9 F (37.2 C) (!) 97.5 F (36.4 C) 97.9 F (36.6 C) 98 F (36.7 C)  TempSrc: Oral Oral Oral Oral  SpO2: 99% 95% 96% 97%  Weight:  90.3 kg    Height:        Intake/Output Summary (Last 24 hours) at 01/28/2020 1437 Last data  filed at 01/28/2020 0649 Gross per 24 hour  Intake 1200 ml  Output 450 ml  Net 750 ml   Filed Weights   01/23/20 2039 01/24/20 2052 01/27/20 2024  Weight: 88.2 kg 88.7 kg 90.3 kg    Examination:  Pleasant male sitting up in bed, AAOx3, no distress CVS: S1-S2, regular rate rhythm Lungs: Decreased at the bases, otherwise clear Abdomen: Soft, nontender, bowel sounds present Extremities: No  edema Skin: No rashes on exposed skin   Data Reviewed: I have personally reviewed following labs and imaging studies  CBC: Recent Labs  Lab 01/22/20 0309  WBC 7.5  HGB 11.8*  HCT 38.5*  MCV 85.2  PLT 435*   Basic Metabolic Panel: Recent Labs  Lab 01/22/20 0309 01/24/20 0146 01/27/20 0602  NA 138 135 138  K 4.1 4.1 4.4  CL 103 102 102  CO2 25 23 27   GLUCOSE 94 93 108*  BUN 14 17 8   CREATININE 1.16 0.87 1.04  CALCIUM 9.8 9.4 9.3  MG 2.0  --   --   PHOS 5.3*  --   --    GFR: Estimated Creatinine Clearance: 90.2 mL/min (by C-G formula based on SCr of 1.04 mg/dL). Liver Function Tests: Recent Labs  Lab 01/22/20 0309  AST 24  ALT 13  ALKPHOS 147*  BILITOT 0.8  PROT 7.0  ALBUMIN 3.7   No results for input(s): LIPASE, AMYLASE in the last 168 hours. No results for input(s): AMMONIA in the last 168 hours. Coagulation Profile: No results for input(s): INR, PROTIME in the last 168 hours. Cardiac Enzymes: No results for input(s): CKTOTAL, CKMB, CKMBINDEX, TROPONINI in the last 168 hours. BNP (last 3 results) No results for input(s): PROBNP in the last 8760 hours. HbA1C: No results for input(s): HGBA1C in the last 72 hours. CBG: No results for input(s): GLUCAP in the last 168 hours. Lipid Profile: No results for input(s): CHOL, HDL, LDLCALC, TRIG, CHOLHDL, LDLDIRECT in the last 72 hours. Thyroid Function Tests: No results for input(s): TSH, T4TOTAL, FREET4, T3FREE, THYROIDAB in the last 72 hours. Anemia Panel: No results for input(s): VITAMINB12, FOLATE, FERRITIN, TIBC, IRON, RETICCTPCT in the last 72 hours. Sepsis Labs: No results for input(s): PROCALCITON, LATICACIDVEN in the last 168 hours.  No results found for this or any previous visit (from the past 240 hour(s)).   Scheduled Meds: . benztropine  1 mg Oral Daily  . buPROPion  150 mg Oral Daily  . enoxaparin (LOVENOX) injection  40 mg Subcutaneous Q24H  . ferrous sulfate  325 mg Oral Q breakfast  .  gabapentin  300 mg Oral BID  . lidocaine  1 patch Transdermal Q24H  . metoprolol succinate  12.5 mg Oral Daily  . mirtazapine  15 mg Oral QHS  . nicotine  14 mg Transdermal Daily  . pantoprazole  40 mg Oral Daily  . senna-docusate  1 tablet Oral BID  . sodium chloride flush  3 mL Intravenous Q12H  . vitamin B-12  100 mcg Oral Daily   Continuous Infusions: . sodium chloride Stopped (01/21/20 0228)  . ceFEPime (MAXIPIME) IV 2 g (01/28/20 3474)  . vancomycin 1,000 mg (01/28/20 0413)    LOS: 21 days   Time spent: 25 minutes.   Zannie Cove, MD [facility]  01/28/2020, 2:37 PM

## 2020-01-29 NOTE — Progress Notes (Signed)
Patient seen and examined stable, slowly improving, remains inpatient to complete 6-week course of antibiotics for recurrent discitis/osteomyelitis of lumbar spine  Brian Polite, MD

## 2020-01-29 NOTE — Plan of Care (Signed)
  Problem: Health Behavior/Discharge Planning: Goal: Ability to manage health-related needs will improve Outcome: Progressing   Problem: Clinical Measurements: Goal: Will remain free from infection Outcome: Progressing   Problem: Pain Managment: Goal: General experience of comfort will improve Outcome: Progressing   

## 2020-01-30 DIAGNOSIS — M4626 Osteomyelitis of vertebra, lumbar region: Secondary | ICD-10-CM | POA: Diagnosis not present

## 2020-01-30 LAB — BASIC METABOLIC PANEL
Anion gap: 14 (ref 5–15)
BUN: 10 mg/dL (ref 6–20)
CO2: 19 mmol/L — ABNORMAL LOW (ref 22–32)
Calcium: 9 mg/dL (ref 8.9–10.3)
Chloride: 101 mmol/L (ref 98–111)
Creatinine, Ser: 0.91 mg/dL (ref 0.61–1.24)
GFR, Estimated: >60 mL/min (ref >60)
Glucose, Bld: 103 mg/dL — ABNORMAL HIGH (ref 70–99)
Potassium: 4.4 mmol/L (ref 3.5–5.1)
Sodium: 134 mmol/L — ABNORMAL LOW (ref 135–145)

## 2020-01-30 NOTE — Progress Notes (Signed)
Occupational Therapy Treatment Patient Details Name: [name] MRN: [id number] DOB: 05-02-1965 Today's Date: 01/30/2020    History of present illness Pt is a 54 y/o male with PMH of discitis April 2021, s/p laminectormy, schizophrenia, cocaine use, presenting with mechanical fall in the shower with shoulder and back pain. Imaging reveals: progresive discitits osteomyelitis of LS spine, Septic arthriits at L3-4, spinal stenosis L1-2 and improved discitis at L1-2.  Underwent apsiration/core biopsy 10/11, neurosurgery recommends medical tx. Acute non displaced Fx of anterolateral L 9th rib; R shoulder with no acute findings.    OT comments  Patient supine in bed and agreeable to OT session.  Reviewed fall prevention techniques with improved recall, reviewed further techniques with patient.  Completing transfers, in room mobility and ADLs with supervision to modified independent level at this time. Based on performance today, patient near functional baseline and no further acute OT needs have been identified.  OT will sign off.    Follow Up Recommendations  No OT follow up;Supervision - Intermittent    Equipment Recommendations  3 in 1 bedside commode    Recommendations for Other Services      Precautions / Restrictions Precautions Precautions: None Precaution Comments: low fall Restrictions Weight Bearing Restrictions: No       Mobility Bed Mobility Overal bed mobility: Independent             General bed mobility comments: independent for bed mobility, knows how to log roll out of bed  Transfers Overall transfer level: Modified independent Equipment used: None Transfers: Sit to/from Stand Sit to Stand: Modified independent (Device/Increase time) Stand pivot transfers: Modified independent (Device/Increase time)       General transfer comment: S for safety, no physical assist given    Balance Overall balance assessment: Mild deficits observed, not formally  tested Sitting-balance support: Feet supported;No upper extremity supported Sitting balance-[name]: Normal     Standing balance support: No upper extremity supported;During functional activity;Single extremity supported Standing balance-[name]: Fair Standing balance comment: unsteadiy without UE support, needed MinA of one person when walking in hall without IV pole or RW                           ADL either performed or assessed with clinical judgement   ADL Overall ADL's : Needs assistance/impaired     Grooming: Wash/dry hands;Modified independent;Standing               Lower Body Dressing: Modified independent;Sit to/from stand Lower Body Dressing Details (indicate cue type and reason): figure 4 techniques for LB dressing, reviewed techniques for safety  Toilet Transfer: Ambulation;Modified Independent       Tub/ Shower Transfer: Tub transfer;Supervision/safety;Ambulation;3 in 1 Tub/Shower Transfer Details (indicate cue type and reason): simulated in room Functional mobility during ADLs: Supervision/safety General ADL Comments: reviewed fall prevention techniques, fair recall      Vision   Vision Assessment?: No apparent visual deficits   Perception     Praxis      Cognition Arousal/Alertness: Awake/alert Behavior During Therapy: WFL for tasks assessed/performed Overall Cognitive Status: Within Functional Limits for tasks assessed                                 General Comments: somewhat impulsive with reduced safety awareness, needs cues for safety        Exercises     Shoulder  Instructions       General Comments reviewed fall prevention techniques    Pertinent Vitals/ Pain       Pain Assessment: Faces Faces Pain Scale: Hurts a little bit Pain Location: back Pain Descriptors / Indicators: Discomfort Pain Intervention(s): Limited activity within patient's tolerance;Monitored during session;Premedicated before  session  Home Living                                          Prior Functioning/Environment              Frequency           Progress Toward Goals  OT Goals(current goals can now be found in the care plan section)  Progress towards OT goals: Progressing toward goals  Acute Rehab OT Goals Patient Stated Goal: to return home OT Goal Formulation: With patient  Plan All goals met and education completed, patient discharged from OT services    Co-evaluation                 AM-PAC OT "6 Clicks" Daily Activity     Outcome Measure   Help from another person eating meals?: None Help from another person taking care of personal grooming?: None Help from another person toileting, which includes using toliet, bedpan, or urinal?: None Help from another person bathing (including washing, rinsing, drying)?: None Help from another person to put on and taking off regular upper body clothing?: None Help from another person to put on and taking off regular lower body clothing?: None 6 Click Score: 24    End of Session    OT Visit Diagnosis: Other abnormalities of gait and mobility (R26.89);Pain Pain - part of body:  (back)   Activity Tolerance Patient tolerated treatment well   Patient Left in bed;with call bell/phone within reach;Other (comment) (NT reports no bed alarm needed)   Nurse Communication Mobility status        Time: 1051-1101 OT Time Calculation (min): 10 min  Charges: OT General Charges $OT Visit: 1 Visit OT Treatments $Self Care/Home Management : 8-22 mins  Barry Brunner, OT Acute Rehabilitation Services Pager [phone] Office [phone]    Chancy Milroy 01/30/2020, 12:32 PM

## 2020-01-30 NOTE — Progress Notes (Signed)
PROGRESS NOTE    [name]  XLK:[id number] DOB: 18-Oct-1965 DOA: 01/06/2020 PCP: [name], MD   Brief Narrative: 54/M w/ past medical history significant for discitis (April 2021 treated with 6 weeks of vancomycin and ceftriaxone, culture unrevealing), status post laminectomy, h/o schizophrenia and ongoing cocaine use who presented to ED complaining of shoulder pain and back pain after a mechanical fall 4 days prior to admission. -Evaluation in the ED imaging noted isolated rib fracture , LS-spine showed possible progressive discitis osteomyelitis.  MRI showed  septic arthritis at L3-4 , associated epidural phlegmon and enhancement extending from T12 -L1.  Resultant moderate to severe diffuse spinal stenosis extending from L1-L2.improved discitis at L1-2.  -Patient was evaluated by neurosurgery, who commended medical treatment and IR guided disc space biopsy to guide antibiotics. Attempt at aspiration unsuccessful, now on broad spectrum Abx for 6 weeks -Stable remains inpatient to complete IV antibiotic therapy   Assessment & Plan:   Recurrent discitis/osteomyelitis of L3-4, associated epidural phlegmon: -Previously with negative cultures, completed 6-week course of IV ceftriaxone and vancomycin in early June inpatient. -IR attempted disc aspiration and biopsy, unsuccessful due to severe disc space narrowing and overhanging degenerative osteophytes -Appreciate infectious disease input, no culture data to guide antibiotic therapy -Initially treated with IV ceftriaxone and vancomycin, ceftriaxone changed to cefepime 10/14, vancomycin continued -Now on MSIR, Flexeril, gabapentin, lidocaine -Continue IV vancomycin to complete 6-week antibiotic course on 11/21 -PT following, ambulating better  HTN;   -Stable, changed coreg to low dose toprol  History of ongoing cocaine, prior ETOH use; Quit alcohol 5 years ago.  -Reports using cocaine 1-2 times a week, adderall and heroin  occasionally -Counseled  COPD; PRN nebulizer.  Nicotine patch  Schizophrenia;  On Invega Q3 months.  Continue with Cogentin, Wellbutrin, Remeron.   GERD; continue PPI.    Mild Hyponatremia;  -resolved  Anemia of chronic disease -Stable,  Acute non displaced Fracture -left ninth Rib;  - Incentive spirometry.   Estimated body mass index is 29.66 kg/m as calculated from the following:   Height as of this encounter: 5\' 9"  (1.753 m).   Weight as of this encounter: 91.1 kg.   DVT prophylaxis: Lovenox Code Status: Full code Family Communication: Discussed with patient in detail  disposition Plan:  Status is: Inpatient  Remains inpatient appropriate because: Prolonged IV antibiotics needed  Dispo: The patient is from: Home              Anticipated d/c is to: to be determined              Anticipated d/c date is: after 6 weeks of IV Abx, last day of antibiotics 11/21              Patient currently is not medically stable to d/c.  Consultants:   IR  ID  Neurosurgery.   Procedures:   Disc aspiration Lumbar spine.   Antimicrobials:    Subjective: -Ambulating in the room, has occasional lower back discomfort, denies any nausea vomiting, no constipation  Objective: Vitals:   01/29/20 1801 01/29/20 2031 01/30/20 0429 01/30/20 0942  BP: (!) 120/91 (!) 122/91 93/63 109/76  Pulse: 98 88 92 88  Resp: 18 17 17 18   Temp: 98.1 F (36.7 C) 97.9 F (36.6 C) 97.8 F (36.6 C) 98 F (36.7 C)  TempSrc:  Oral Oral Oral  SpO2: 97% 100% 93% 94%  Weight:      Height:        Intake/Output Summary (  Last 24 hours) at 01/30/2020 1437 Last data filed at 01/30/2020 1027 Gross per 24 hour  Intake 1123 ml  Output 550 ml  Net 573 ml   Filed Weights   01/24/20 2052 01/27/20 2024 01/28/20 2047  Weight: 88.7 kg 90.3 kg 91.1 kg    Examination:  Pleasant male sitting up in bed, watching TV, AAOx3, no distress CVS: S1-S2, regular rate rhythm Lungs: Clear  bilaterally Abdomen: Soft, obese, nontender, bowel sounds present Extremities: No edema  Skin: No rashes on exposed skin   Data Reviewed: I have personally reviewed following labs and imaging studies  CBC: No results for input(s): WBC, NEUTROABS, HGB, HCT, MCV, PLT in the last 168 hours. Basic Metabolic Panel: Recent Labs  Lab 01/24/20 0146 01/27/20 0602 01/30/20 0136  NA 135 138 134*  K 4.1 4.4 4.4  CL 102 102 101  CO2 23 27 19*  GLUCOSE 93 108* 103*  BUN 17 8 10   CREATININE 0.87 1.04 0.91  CALCIUM 9.4 9.3 9.0   GFR: Estimated Creatinine Clearance: 103.6 mL/min (by C-G formula based on SCr of 0.91 mg/dL). Liver Function Tests: No results for input(s): AST, ALT, ALKPHOS, BILITOT, PROT, ALBUMIN in the last 168 hours. No results for input(s): LIPASE, AMYLASE in the last 168 hours. No results for input(s): AMMONIA in the last 168 hours. Coagulation Profile: No results for input(s): INR, PROTIME in the last 168 hours. Cardiac Enzymes: No results for input(s): CKTOTAL, CKMB, CKMBINDEX, TROPONINI in the last 168 hours. BNP (last 3 results) No results for input(s): PROBNP in the last 8760 hours. HbA1C: No results for input(s): HGBA1C in the last 72 hours. CBG: No results for input(s): GLUCAP in the last 168 hours. Lipid Profile: No results for input(s): CHOL, HDL, LDLCALC, TRIG, CHOLHDL, LDLDIRECT in the last 72 hours. Thyroid Function Tests: No results for input(s): TSH, T4TOTAL, FREET4, T3FREE, THYROIDAB in the last 72 hours. Anemia Panel: No results for input(s): VITAMINB12, FOLATE, FERRITIN, TIBC, IRON, RETICCTPCT in the last 72 hours. Sepsis Labs: No results for input(s): PROCALCITON, LATICACIDVEN in the last 168 hours.  No results found for this or any previous visit (from the past 240 hour(s)).   Scheduled Meds: . benztropine  1 mg Oral Daily  . buPROPion  150 mg Oral Daily  . enoxaparin (LOVENOX) injection  40 mg Subcutaneous Q24H  . ferrous sulfate  325 mg  Oral Q breakfast  . gabapentin  300 mg Oral BID  . lidocaine  1 patch Transdermal Q24H  . metoprolol succinate  12.5 mg Oral Daily  . mirtazapine  15 mg Oral QHS  . nicotine  14 mg Transdermal Daily  . pantoprazole  40 mg Oral Daily  . saccharomyces boulardii  250 mg Oral BID  . senna-docusate  1 tablet Oral BID  . sodium chloride flush  3 mL Intravenous Q12H  . vitamin B-12  100 mcg Oral Daily   Continuous Infusions: . sodium chloride 250 mL (01/30/20 1407)  . ceFEPime (MAXIPIME) IV 2 g (01/30/20 1409)  . vancomycin 1,000 mg (01/30/20 0315)    LOS: 23 days   Time spent: 25 minutes.   Zannie Cove, MD [facility]  01/30/2020, 2:37 PM

## 2020-01-30 NOTE — Progress Notes (Signed)
Pharmacy Antibiotic Note  [name] is a 54 y.o. male admitted on 01/06/2020 with prior history of lumbar osteomyelitis/discitis in May-June 2021 now with back pain post fall at home and MRI showing lumbar osteomyelitis.  Neurosurgery unable to get successful disc aspiration during biopsy. Patient with history of L ankle Enterobacter infection s/p hardware removal in 2019; ortho saw patient here, possible chronic vs advancing osteo. Pharmacy has been consulted for Vancomycin and Cefepime dosing..  Plan is for 6 wks of IV abx (Vanc/Cefepime, thru 02/18/20), then consolidate to PO doxycycline and cephalexin for another month.   The patient's SCr today is stable - 0.91 << 1.04. A Vancomycin trough was therapeutic on 10/28 at 17 mcg/ml - will not make any dose adjustments at this time. Will plan to recheck a SCr and trough on 11/4.   Plan: Continue Vancomycin 1 gm IV q12hr. Continue Cefepime 2 gm IV q8hr. Bmet at least q72h while on Vanc. Next 11/4 Vanc trough weekly, or as indicated.  Target vanc troughs 15-20 mcg/ml.  Height: 5\' 9"  (175.3 cm) Weight: 91.1 kg (200 lb 13.4 oz) IBW/kg (Calculated) : 70.7  Temp (24hrs), Avg:98 F (36.7 C), Min:97.8 F (36.6 C), Max:98.1 F (36.7 C)  Recent Labs  Lab 01/24/20 0146 01/25/20 0329 01/27/20 0602 01/30/20 0136  CREATININE 0.87  --  1.04 0.91  VANCOTROUGH  --  17  --   --     Estimated Creatinine Clearance: 103.6 mL/min (by C-G formula based on SCr of 0.91 mg/dL).    Allergies  Allergen Reactions  . Chlorhexidine Itching and Rash    Antimicrobials this admission: Ceftriaxone 10/11 >> 10/14 Vancomycin 10/11 >> (11/21) Cefepime 10/14 >> (11/21)  Dose adjustments / vancomycin trough levels: 10/15 VT = 22 mcg/ml but dose prior PM dose was given ~4 hours late so true trough ~15 mcg/ml (therapeutic) on 1500 mg IV q12h> no change 10/21 VT = 23 mcg/ml , drawn ~50 min early, on 1500 mg IV q12h > decr to 1250 mg IV q12h 10/25 VT= 21  mcg/ml - on 1250 mg IV q12h > decr to 1gm IV q12h 10/28 VT = 17 mcg/ml - continue 1gm IV q12h   Microbiology results: 10/11 fluid, disc L1 cx: negative 10/11 Blood x 2: negative 10/10 COVID, flu: neg  Thank you for allowing pharmacy to be a part of this patient's care.  Alycia Rossetti, PharmD, BCPS Clinical Pharmacist Clinical phone for 01/30/2020: [phone] 01/30/2020 10:41 AM   **Pharmacist phone directory can now be found on [email or web address] ([email or web address]).  Listed under [place].

## 2020-01-30 NOTE — Plan of Care (Signed)
  Problem: Health Behavior/Discharge Planning: Goal: Ability to manage health-related needs will improve Outcome: Progressing   Problem: Clinical Measurements: Goal: Will remain free from infection Outcome: Progressing   Problem: Pain Managment: Goal: General experience of comfort will improve Outcome: Progressing   

## 2020-01-30 NOTE — Progress Notes (Signed)
Physical Therapy Treatment Patient Details Name: [name] MRN: [id number] DOB: 10-19-1965 Today's Date: 01/30/2020    History of Present Illness Pt is a 54 y/o male with PMH of discitis April 2021, s/p laminectormy, schizophrenia, cocaine use, presenting with mechanical fall in the shower with shoulder and back pain. Imaging reveals: progresive discitits osteomyelitis of LS spine, Septic arthriits at L3-4, spinal stenosis L1-2 and improved discitis at L1-2.  Underwent apsiration/core biopsy 10/11, neurosurgery recommends medical tx. Acute non displaced Fx of anterolateral L 9th rib; R shoulder with no acute findings.     PT Comments    Patient received in bed asleep, took extended time and verbal/tactile stimulation to wake but very pleasant and cooperative with PT. Able to mobilize on S level with IV pole in room, however after infusion/IV pole was taken off by RN he did require MinA/HHA of one person to maintain balance in hallway, reports he usually uses RW at home. Fatigued at end of gait distance. Still in need of VC for safety due to impaired safety awareness and some impulsivity present today. Left sitting up in recliner with all needs met, RN aware of patient status.     Follow Up Recommendations  No PT follow up     Equipment Recommendations  None recommended by PT    Recommendations for Other Services       Precautions / Restrictions Precautions Precautions: None Precaution Comments: low fall Restrictions Weight Bearing Restrictions: No    Mobility  Bed Mobility Overal bed mobility: Independent             General bed mobility comments: independent for bed mobility, knows how to log roll out of bed  Transfers Overall transfer level: Modified independent Equipment used: None Transfers: Sit to/from Stand Sit to Stand: Modified independent (Device/Increase time) Stand pivot transfers: Modified independent (Device/Increase time)       General transfer  comment: S for safety, no physical assist given  Ambulation/Gait Ambulation/Gait assistance: Min assist Gait Distance (Feet): 800 Feet Assistive device: 1 person hand held assist Gait Pattern/deviations: Step-through pattern;Decreased stride length;Drifts right/left;Trunk flexed;Antalgic Gait velocity: decreased   General Gait Details: RN took IV pole off as infusion was complete- needed HHA of 1 person to maintain balance and reports he does usually use a RW; occasional reaching for railing in hallway along with HHA provided today. Mild antalgia.  Able to be ambulatory in room with IV pole with S prior to RN taking off infusion   Stairs             Wheelchair Mobility    Modified Rankin (Stroke Patients Only)       Balance Overall balance assessment: Mild deficits observed, not formally tested Sitting-balance support: Feet supported;No upper extremity supported Sitting balance-[name]: Normal     Standing balance support: No upper extremity supported;During functional activity;Single extremity supported Standing balance-[name]: Fair Standing balance comment: unsteadiy without UE support, needed MinA of one person when walking in hall without IV pole or RW                            Cognition Arousal/Alertness: Awake/alert Behavior During Therapy: WFL for tasks assessed/performed Overall Cognitive Status: Within Functional Limits for tasks assessed                                 General Comments: somewhat impulsive  with reduced safety awareness, needs cues for safety      Exercises      General Comments        Pertinent Vitals/Pain Pain Assessment: Faces Faces Pain Scale: Hurts a little bit Pain Location: back Pain Descriptors / Indicators: Tightness Pain Intervention(s): Limited activity within patient's tolerance;Repositioned;Monitored during session    Home Living                      Prior Function             PT Goals (current goals can now be found in the care plan section) Acute Rehab PT Goals Patient Stated Goal: to return home PT Goal Formulation: With patient Time For Goal Achievement: 02/13/20 Potential to Achieve Goals: Good Progress towards PT goals: Progressing toward goals    Frequency    Min 1X/week      PT Plan Current plan remains appropriate    Co-evaluation              AM-PAC PT "6 Clicks" Mobility   Outcome Measure  Help needed turning from your back to your side while in a flat bed without using bedrails?: None Help needed moving from lying on your back to sitting on the side of a flat bed without using bedrails?: None Help needed moving to and from a bed to a chair (including a wheelchair)?: None Help needed standing up from a chair using your arms (e.g., wheelchair or bedside chair)?: None Help needed to walk in hospital room?: None Help needed climbing 3-5 steps with a railing? : A Little 6 Click Score: 23    End of Session   Activity Tolerance: Patient tolerated treatment well Patient left: in chair;with call bell/phone within reach Nurse Communication: Mobility status PT Visit Diagnosis: Unsteadiness on feet (R26.81)     Time: 5284-1324 PT Time Calculation (min) (ACUTE ONLY): 18 min  Charges:  $Gait Training: 8-22 mins                     Madelaine Etienne, DPT, PN1   Supplemental Physical Therapist [facility]    Pager [phone] Acute Rehab Office [phone]

## 2020-01-31 DIAGNOSIS — M4626 Osteomyelitis of vertebra, lumbar region: Secondary | ICD-10-CM | POA: Diagnosis not present

## 2020-01-31 NOTE — Plan of Care (Signed)
  Problem: Health Behavior/Discharge Planning: Goal: Ability to manage health-related needs will improve Outcome: Progressing   Problem: Clinical Measurements: Goal: Will remain free from infection Outcome: Progressing   Problem: Pain Managment: Goal: General experience of comfort will improve Outcome: Progressing   

## 2020-01-31 NOTE — Progress Notes (Signed)
PROGRESS NOTE    [name]  ZOX:[id number] DOB: 1966-03-11 DOA: 01/06/2020 PCP: [name], MD   Brief Narrative: 54 year old with past medical history significant for discitis (April 2021 treated with 6 weeks of vancomycin and ceftriaxone, cultures unrevealing), status post laminectomy, history of schizophrenia and ongoing cocaine use who presented to the ED complaining of shoulder pain and back pain after a mechanical fall 4 days prior to admission.  -Evaluation in the ED imaging noted isolated rib fracture, LS spine show possible progressive discitis osteomyelitis.  MRI shows septic arthritis at L3-4, associated epidural phlegmon and enhancement extending from T12-L1.  Resultant moderate to severe diffuse spinal stenosis extending from L1-L2.  Improved discitis at L1-2. Patient was evaluated by neurosurgery, who recommended medical treatment and IR guided disc space biopsy to guide  antibiotics.  Attempt at aspiration unsuccessful, now on broad-spectrum antibiotics for 6 weeks. -Remain inpatient to complete IV antibiotic therapy.   Assessment & Plan:   Principal Problem:   Acute osteomyelitis of lumbar spine (HCC) Active Problems:   Cocaine abuse (HCC)   Benign essential HTN   History of CVA (cerebrovascular accident)   Chronic pain syndrome   Closed fracture of one rib of left side   Discitis of lumbar region   Ankle ankylosis, left    1-Recurrent discitis/osteomyelitis of L3-4, associated epidural phlegmon: -Previous history of discitis completed 6 weeks course of IV ceftriaxone and vancomycin early June inpatient. -Failed attempt at disc aspiration and biopsy due to severe disc space narrowing. -Patient was initially treated with IV ceftriaxone and vancomycin, subsequently ceftriaxone was changed to cefepime on 10/14.  Continue with vancomycin. -Continue with pain management, Flexeril pain, morphine immediate release -Continue with IV vancomycin and cefepime to  complete 6 weeks of antibiotics course on 11/21st -Complaining of back pain still.  Will repeat CBC and CRP  2-Hypertension: Continue with Toprol.  3-History of ongoing cocaine, prior EtOH use: -Counseled.   4-COPD: Continue with as needed nebulizer. Nicotine patch.  5-Schizophrenia: On Invega every 3 months, continue with Cogentin, Wellbutrin and Remeron.  6-Mild hyponatremia: Resolved.  7-Anemia of chronic disease: Stable.  8-Acute nondisplaced fracture, left ninth rib: Pain management incentive spirometry.  9-GERD: Continue with PPI.   Estimated body mass index is 29.66 kg/m as calculated from the following:   Height as of this encounter: 5\' 9"  (1.753 m).   Weight as of this encounter: 91.1 kg.   DVT prophylaxis: Lovenox Code Status: Full code Family Communication: Care discussed with patient.  Disposition Plan:  Status is: Inpatient  Remains inpatient appropriate because:IV treatments appropriate due to intensity of illness or inability to take PO   Dispo: The patient is from: Home              Anticipated d/c is to: Home              Anticipated d/c date is: > 3 days              Patient currently is not medically stable to d/c. Needs to complete IV antibiotics for recurrent Discitis.         Consultants:   Neurosurgery  ID  IR  Procedures:   Unsuccessful disc aspiration lumbar spine  Antimicrobials:  Vancomycin and cefepime  Subjective: Patient sitting in the recliner, he has been ambulating in the hall.  He complaining of back pain, he report a specific spot is tender.   Objective: Vitals:   01/30/20 2023 01/31/20 8119 01/31/20 0427 01/31/20 1478  BP: 123/90 94/62 91/62  107/75  Pulse: 94 (!) 102 95 96  Resp: 20 18 18 18   Temp: 98 F (36.7 C) 98.3 F (36.8 C) 98.2 F (36.8 C) 98.2 F (36.8 C)  TempSrc: Oral     SpO2: 100% 94% 94% 98%  Weight:      Height:        Intake/Output Summary (Last 24 hours) at 01/31/2020 1153 Last  data filed at 01/31/2020 0747 Gross per 24 hour  Intake 1360 ml  Output 425 ml  Net 935 ml   Filed Weights   01/24/20 2052 01/27/20 2024 01/28/20 2047  Weight: 88.7 kg 90.3 kg 91.1 kg    Examination:  General exam: Appears calm and comfortable  Respiratory system: Clear to auscultation. Respiratory effort normal. Cardiovascular system: S1 & S2 heard, RRR. No JVD, murmurs, rubs, gallops or clicks. No pedal edema. Gastrointestinal system: Abdomen is nondistended, soft and nontender. No organomegaly or masses felt. Normal bowel sounds heard. Central nervous system: Alert and oriented. No focal neurological deficits. Extremities: Symmetric 5 x 5 power.   Data Reviewed: I have personally reviewed following labs and imaging studies  CBC: No results for input(s): WBC, NEUTROABS, HGB, HCT, MCV, PLT in the last 168 hours. Basic Metabolic Panel: Recent Labs  Lab 01/27/20 0602 01/30/20 0136  NA 138 134*  K 4.4 4.4  CL 102 101  CO2 27 19*  GLUCOSE 108* 103*  BUN 8 10  CREATININE 1.04 0.91  CALCIUM 9.3 9.0   GFR: Estimated Creatinine Clearance: 103.6 mL/min (by C-G formula based on SCr of 0.91 mg/dL). Liver Function Tests: No results for input(s): AST, ALT, ALKPHOS, BILITOT, PROT, ALBUMIN in the last 168 hours. No results for input(s): LIPASE, AMYLASE in the last 168 hours. No results for input(s): AMMONIA in the last 168 hours. Coagulation Profile: No results for input(s): INR, PROTIME in the last 168 hours. Cardiac Enzymes: No results for input(s): CKTOTAL, CKMB, CKMBINDEX, TROPONINI in the last 168 hours. BNP (last 3 results) No results for input(s): PROBNP in the last 8760 hours. HbA1C: No results for input(s): HGBA1C in the last 72 hours. CBG: No results for input(s): GLUCAP in the last 168 hours. Lipid Profile: No results for input(s): CHOL, HDL, LDLCALC, TRIG, CHOLHDL, LDLDIRECT in the last 72 hours. Thyroid Function Tests: No results for input(s): TSH, T4TOTAL,  FREET4, T3FREE, THYROIDAB in the last 72 hours. Anemia Panel: No results for input(s): VITAMINB12, FOLATE, FERRITIN, TIBC, IRON, RETICCTPCT in the last 72 hours. Sepsis Labs: No results for input(s): PROCALCITON, LATICACIDVEN in the last 168 hours.  No results found for this or any previous visit (from the past 240 hour(s)).       Radiology Studies: No results found.      Scheduled Meds:  benztropine  1 mg Oral Daily   buPROPion  150 mg Oral Daily   enoxaparin (LOVENOX) injection  40 mg Subcutaneous Q24H   ferrous sulfate  325 mg Oral Q breakfast   gabapentin  300 mg Oral BID   lidocaine  1 patch Transdermal Q24H   metoprolol succinate  12.5 mg Oral Daily   mirtazapine  15 mg Oral QHS   nicotine  14 mg Transdermal Daily   pantoprazole  40 mg Oral Daily   saccharomyces boulardii  250 mg Oral BID   senna-docusate  1 tablet Oral BID   sodium chloride flush  3 mL Intravenous Q12H   vitamin B-12  100 mcg Oral Daily   Continuous Infusions:  sodium chloride 250 mL (01/30/20 1407)   ceFEPime (MAXIPIME) IV 2 g (01/31/20 0557)   vancomycin 1,000 mg (01/31/20 0419)     LOS: 24 days    Time spent: 35 minutes.     Alba Cory, MD [facility]   If 7PM-7AM, please contact night-coverage [email or web address]  01/31/2020, 11:53 AM

## 2020-02-01 DIAGNOSIS — M4626 Osteomyelitis of vertebra, lumbar region: Secondary | ICD-10-CM | POA: Diagnosis not present

## 2020-02-01 LAB — CBC
HCT: 34.1 % — ABNORMAL LOW (ref 39.0–52.0)
Hemoglobin: 10.9 g/dL — ABNORMAL LOW (ref 13.0–17.0)
MCH: 27.2 pg (ref 26.0–34.0)
MCHC: 32 g/dL (ref 30.0–36.0)
MCV: 85 fL (ref 80.0–100.0)
Platelets: 256 10*3/uL (ref 150–400)
RBC: 4.01 MIL/uL — ABNORMAL LOW (ref 4.22–5.81)
RDW: 15.9 % — ABNORMAL HIGH (ref 11.5–15.5)
WBC: 6.4 10*3/uL (ref 4.0–10.5)
nRBC: 0 % (ref 0.0–0.2)

## 2020-02-01 LAB — BASIC METABOLIC PANEL
Anion gap: 12 (ref 5–15)
BUN: 11 mg/dL (ref 6–20)
CO2: 26 mmol/L (ref 22–32)
Calcium: 9.5 mg/dL (ref 8.9–10.3)
Chloride: 101 mmol/L (ref 98–111)
Creatinine, Ser: 1.04 mg/dL (ref 0.61–1.24)
GFR, Estimated: >60 mL/min (ref >60)
Glucose, Bld: 91 mg/dL (ref 70–99)
Potassium: 4.2 mmol/L (ref 3.5–5.1)
Sodium: 139 mmol/L (ref 135–145)

## 2020-02-01 LAB — VANCOMYCIN, TROUGH: Vancomycin Tr: 17 ug/mL (ref 15–20)

## 2020-02-01 LAB — C-REACTIVE PROTEIN: CRP: 0.7 mg/dL (ref <1.0)

## 2020-02-01 NOTE — Plan of Care (Signed)
  Problem: Health Behavior/Discharge Planning: Goal: Ability to manage health-related needs will improve Outcome: Progressing   

## 2020-02-01 NOTE — Progress Notes (Signed)
PROGRESS NOTE    [name]  JYN:[id number] DOB: 1965/12/23 DOA: 01/06/2020 PCP: [name], MD   Brief Narrative: 54 year old with past medical history significant for discitis (April 2021 treated with 6 weeks of vancomycin and ceftriaxone, cultures unrevealing), status post laminectomy, history of schizophrenia and ongoing cocaine use who presented to the ED complaining of shoulder pain and back pain after a mechanical fall 4 days prior to admission.  -Evaluation in the ED imaging noted isolated rib fracture, LS spine show possible progressive discitis osteomyelitis.  MRI shows septic arthritis at L3-4, associated epidural phlegmon and enhancement extending from T12-L1.  Resultant moderate to severe diffuse spinal stenosis extending from L1-L2.  Improved discitis at L1-2. Patient was evaluated by neurosurgery, who recommended medical treatment and IR guided disc space biopsy to guide  antibiotics.  Attempt at aspiration unsuccessful, now on broad-spectrum antibiotics for 6 weeks. -Remain inpatient to complete IV antibiotic therapy.   Assessment & Plan:   Principal Problem:   Acute osteomyelitis of lumbar spine (HCC) Active Problems:   Cocaine abuse (HCC)   Benign essential HTN   History of CVA (cerebrovascular accident)   Chronic pain syndrome   Closed fracture of one rib of left side   Discitis of lumbar region   Ankle ankylosis, left    1-Recurrent discitis/osteomyelitis of L3-4, associated epidural phlegmon: -Previous history of discitis completed 6 weeks course of IV ceftriaxone and vancomycin early June inpatient. -Failed attempt at disc aspiration and biopsy due to severe disc space narrowing. -Patient was initially treated with IV ceftriaxone and vancomycin, subsequently ceftriaxone was changed to cefepime on 10/14.  Continue with vancomycin. -Continue with pain management, Flexeril pain, morphine immediate release -Continue with IV vancomycin and cefepime to  complete 6 weeks of antibiotics course on 11/21st -Complaining of back pain, WBC normal, CRP 0.7 from 2.1 Continue with current management.   2-Hypertension: Continue with Toprol.  3-History of ongoing cocaine, prior EtOH use: -Counseled.   4-COPD: Continue with as needed nebulizer. Nicotine patch.  5-Schizophrenia: On Invega every 3 months, continue with Cogentin, Wellbutrin and Remeron.  6-Mild hyponatremia: Resolved.  7-Anemia of chronic disease: Stable. Low B12, on supplement.   8-Acute nondisplaced fracture, left ninth rib: Pain management incentive spirometry.  9-GERD: Continue with PPI.   Estimated body mass index is 29.66 kg/m as calculated from the following:   Height as of this encounter: 5\' 9"  (1.753 m).   Weight as of this encounter: 91.1 kg.   DVT prophylaxis: Lovenox Code Status: Full code Family Communication: Care discussed with patient.  Disposition Plan:  Status is: Inpatient  Remains inpatient appropriate because:IV treatments appropriate due to intensity of illness or inability to take PO   Dispo: The patient is from: Home              Anticipated d/c is to: Home              Anticipated d/c date is: > 3 days              Patient currently is not medically stable to d/c. Needs to complete IV antibiotics for recurrent Discitis.         Consultants:   Neurosurgery  ID  IR  Procedures:   Unsuccessful disc aspiration lumbar spine  Antimicrobials:  Vancomycin and cefepime  Subjective: He report back pain, plan for heating patch.  Eating, moving bowel.    Objective: Vitals:   01/31/20 1600 01/31/20 2043 02/01/20 0446 02/01/20 0929  BP: 120/89  119/86 116/72 (!) 142/97  Pulse: 90 91 84 84  Resp: 12 18 20 18   Temp: 98 F (36.7 C) 98.9 F (37.2 C) 97.8 F (36.6 C) 97.6 F (36.4 C)  TempSrc: Oral Oral Oral Oral  SpO2: 100% 97% 99% 100%  Weight:      Height:        Intake/Output Summary (Last 24 hours) at 02/01/2020  1429 Last data filed at 02/01/2020 0929 Gross per 24 hour  Intake 1460 ml  Output 800 ml  Net 660 ml   Filed Weights   01/24/20 2052 01/27/20 2024 01/28/20 2047  Weight: 88.7 kg 90.3 kg 91.1 kg    Examination:  General exam: NAD Respiratory system: CTA Cardiovascular system: S 1, S 2 RRR Gastrointestinal system: BS present, soft, nt Central nervous system: alert Extremities: symmetric power   Data Reviewed: I have personally reviewed following labs and imaging studies  CBC: Recent Labs  Lab 02/01/20 0210  WBC 6.4  HGB 10.9*  HCT 34.1*  MCV 85.0  PLT 256   Basic Metabolic Panel: Recent Labs  Lab 01/27/20 0602 01/30/20 0136  NA 138 134*  K 4.4 4.4  CL 102 101  CO2 27 19*  GLUCOSE 108* 103*  BUN 8 10  CREATININE 1.04 0.91  CALCIUM 9.3 9.0   GFR: Estimated Creatinine Clearance: 103.6 mL/min (by C-G formula based on SCr of 0.91 mg/dL). Liver Function Tests: No results for input(s): AST, ALT, ALKPHOS, BILITOT, PROT, ALBUMIN in the last 168 hours. No results for input(s): LIPASE, AMYLASE in the last 168 hours. No results for input(s): AMMONIA in the last 168 hours. Coagulation Profile: No results for input(s): INR, PROTIME in the last 168 hours. Cardiac Enzymes: No results for input(s): CKTOTAL, CKMB, CKMBINDEX, TROPONINI in the last 168 hours. BNP (last 3 results) No results for input(s): PROBNP in the last 8760 hours. HbA1C: No results for input(s): HGBA1C in the last 72 hours. CBG: No results for input(s): GLUCAP in the last 168 hours. Lipid Profile: No results for input(s): CHOL, HDL, LDLCALC, TRIG, CHOLHDL, LDLDIRECT in the last 72 hours. Thyroid Function Tests: No results for input(s): TSH, T4TOTAL, FREET4, T3FREE, THYROIDAB in the last 72 hours. Anemia Panel: No results for input(s): VITAMINB12, FOLATE, FERRITIN, TIBC, IRON, RETICCTPCT in the last 72 hours. Sepsis Labs: No results for input(s): PROCALCITON, LATICACIDVEN in the last 168  hours.  No results found for this or any previous visit (from the past 240 hour(s)).       Radiology Studies: No results found.      Scheduled Meds:  benztropine  1 mg Oral Daily   buPROPion  150 mg Oral Daily   enoxaparin (LOVENOX) injection  40 mg Subcutaneous Q24H   ferrous sulfate  325 mg Oral Q breakfast   gabapentin  300 mg Oral BID   lidocaine  1 patch Transdermal Q24H   metoprolol succinate  12.5 mg Oral Daily   mirtazapine  15 mg Oral QHS   nicotine  14 mg Transdermal Daily   pantoprazole  40 mg Oral Daily   saccharomyces boulardii  250 mg Oral BID   senna-docusate  1 tablet Oral BID   sodium chloride flush  3 mL Intravenous Q12H   vitamin B-12  100 mcg Oral Daily   Continuous Infusions:  sodium chloride 250 mL (01/30/20 1407)   ceFEPime (MAXIPIME) IV 2 g (02/01/20 0628)   vancomycin 1,000 mg (02/01/20 0417)     LOS: 25 days    Time  spent: 35 minutes.     Alba Cory, MD [facility]   If 7PM-7AM, please contact night-coverage [email or web address]  02/01/2020, 2:29 PM

## 2020-02-01 NOTE — Progress Notes (Signed)
Pharmacy Antibiotic Note  [name] is a 54 y.o. male admitted on 01/06/2020 with prior history of lumbar osteomyelitis/discitis in May-June 2021 now with back pain post fall at home and MRI showing lumbar osteomyelitis.  Neurosurgery was unable to get successful disc aspiration during biopsy. Patient with history of L ankle Enterobacter infection, S/P hardware removal in 2019; ortho saw patient here, possible chronic vs advancing osteo. Pharmacy has been consulted for vancomycin and cefepime dosing.  Plan is for 6 wks of IV abx (vanc/cefepime, thru 02/18/20), then consolidate to PO doxycycline and cephalexin for another month.   Vancomycin trough level drawn this afternoon (drawn ~12 hrs after last dose of vancomycin 1 gm IV Q 12 hr regimen) was 17 mg/L, which is within the goal range for this pt. Scr this afternoon was 1.04, which is within the pt's range for this admission, CrCl 90.6 ml/min)  Plan: Continue vancomycin 1 gm IV Q 12 hrs Continue cefepime 2 gm IV Q 8 hrs Bmet at least Q 72 hrs while on vancomycin (next on 11/7) Vancomycin trough weekly, or as indicated (goal: 15-20 mg/L)  Height: 5\' 9"  (175.3 cm) Weight: 91.1 kg (200 lb 13.4 oz) IBW/kg (Calculated) : 70.7  Temp (24hrs), Avg:98.1 F (36.7 C), Min:97.6 F (36.4 C), Max:98.9 F (37.2 C)  Recent Labs  Lab 01/27/20 0602 01/30/20 0136 02/01/20 0210 02/01/20 1611 02/01/20 1622  WBC  --   --  6.4  --   --   CREATININE 1.04 0.91  --   --  1.04  VANCOTROUGH  --   --   --  17  --     Estimated Creatinine Clearance: 90.6 mL/min (by C-G formula based on SCr of 1.04 mg/dL).    Allergies  Allergen Reactions  . Chlorhexidine Itching and Rash    Antimicrobials this admission: Ceftriaxone 10/11 >> 10/14 Vancomycin 10/11 >> (11/21) Cefepime 10/14 >> (11/21)  Dose adjustments/vancomycin trough levels: 10/15 VT = 22 mg/L, but dose prior PM dose was given ~4 hours late so true trough ~15mg /L (therapeutic) on 1500 mg IV  q12h> no change 10/21 VT = 23 mg/L, drawn ~50 min early, on 1500 mg IV q12h > decrease to 1250 mg IV q12h 10/25 VT= 21 mg/L- on 1250 mg IV q12h > decrease to 1gm IV q12h 10/28 VT = 17 mg/L- continue vancomycin 1 gm IV Q 12 hrs 11/4 VT = 17 mg/L - continue vancomycin 1 gm IV Q 12 hrs  Microbiology results: 10/11 fluid, disc L1 cx: negative/final 10/11 Blood x 2: negative/final 10/10 COVID, flu: neg  Thank you for allowing pharmacy to be a part of this patient's care.  Gillermina Hu, PharmD, BCPS, [facility] Clinical Pharmacist 02/01/2020 5:39 PM

## 2020-02-02 DIAGNOSIS — M4626 Osteomyelitis of vertebra, lumbar region: Secondary | ICD-10-CM | POA: Diagnosis not present

## 2020-02-02 NOTE — Progress Notes (Addendum)
PROGRESS NOTE    [name]  ZOX:[id number] DOB: 12/19/1965 DOA: 01/06/2020 PCP: [name], MD   Brief Narrative: 54 year old with past medical history significant for discitis (April 2021 treated with 6 weeks of vancomycin and ceftriaxone, cultures unrevealing), status post laminectomy, history of schizophrenia and ongoing cocaine use who presented to the ED complaining of shoulder pain and back pain after a mechanical fall 4 days prior to admission.  -Evaluation in the ED imaging noted isolated rib fracture, LS spine show possible progressive discitis osteomyelitis.  MRI shows septic arthritis at L3-4, associated epidural phlegmon and enhancement extending from T12-L1.  Resultant moderate to severe diffuse spinal stenosis extending from L1-L2.  Improved discitis at L1-2. Patient was evaluated by neurosurgery, who recommended medical treatment and IR guided disc space biopsy to guide  antibiotics.  Attempt at aspiration unsuccessful, now on broad-spectrum antibiotics for 6 weeks. -Remain inpatient to complete IV antibiotic therapy.   Assessment & Plan:   Principal Problem:   Acute osteomyelitis of lumbar spine (HCC) Active Problems:   Cocaine abuse (HCC)   Benign essential HTN   History of CVA (cerebrovascular accident)   Chronic pain syndrome   Closed fracture of one rib of left side   Discitis of lumbar region   Ankle ankylosis, left    1-Recurrent discitis/osteomyelitis of L3-4, associated epidural phlegmon: -Previous history of discitis completed 6 weeks course of IV ceftriaxone and vancomycin early June inpatient. -Failed attempt at disc aspiration and biopsy due to severe disc space narrowing. -Patient was initially treated with IV ceftriaxone and vancomycin, subsequently ceftriaxone was changed to cefepime on 10/14.  Continue with vancomycin. -Continue with pain management, Flexeril pain, morphine immediate release -Continue with IV vancomycin and cefepime to  complete 6 weeks of antibiotics course on 11/21st -Will need to asked ID when to repeat MRI, and if patient need oral antibiotics at discharge.  -Complaining of back pain, WBC normal, CRP 0.7 from 2.1  -Continue with current management.   2-Hypertension: Continue with Toprol.  3-History of ongoing cocaine, prior ETOH use: -Counseled.   4-COPD: Continue with as needed nebulizer. Nicotine patch.  5-Schizophrenia: On Invega every 3 months, continue with Cogentin, Wellbutrin and Remeron. Invega next dose due 03/10/2020. He last dose of invega was on September 9.   6-Mild hyponatremia: Resolved.  7-Anemia of chronic disease: Stable. Low B12, on supplement.   8-Acute nondisplaced fracture, left ninth rib: Pain management incentive spirometry.  9-GERD: Continue with PPI.   Estimated body mass index is 29.63 kg/m as calculated from the following:   Height as of this encounter: 5\' 9"  (1.753 m).   Weight as of this encounter: 91 kg.   DVT prophylaxis: Lovenox Code Status: Full code Family Communication: Care discussed with patient.  Disposition Plan:  Status is: Inpatient  Remains inpatient appropriate because:IV treatments appropriate due to intensity of illness or inability to take PO   Dispo: The patient is from: Home              Anticipated d/c is to: Home              Anticipated d/c date is: > 3 days              Patient currently is not medically stable to d/c. Needs to complete IV antibiotics for recurrent Discitis.         Consultants:   Neurosurgery  ID  IR  Procedures:   Unsuccessful disc aspiration lumbar spine  Antimicrobials:  Vancomycin  and cefepime  Subjective: He is still complaining of back pain. Heating patch help.    Objective: Vitals:   02/01/20 1636 02/01/20 2107 02/02/20 0605 02/02/20 0958  BP: (!) 140/95 130/82 (!) 120/96 112/81  Pulse: 89 92 93 89  Resp: 18  20 16   Temp: 98 F (36.7 C) 98.9 F (37.2 C) 98.9 F (37.2 C)  98 F (36.7 C)  TempSrc: Oral Oral Oral Oral  SpO2: 100% 98% 98% 97%  Weight:  91 kg    Height:        Intake/Output Summary (Last 24 hours) at 02/02/2020 1334 Last data filed at 02/02/2020 0813 Gross per 24 hour  Intake 2680 ml  Output 800 ml  Net 1880 ml   Filed Weights   01/27/20 2024 01/28/20 2047 02/01/20 2107  Weight: 90.3 kg 91.1 kg 91 kg    Examination:  General exam: NAD Respiratory system: CTA Cardiovascular system: SS 1, S 2 RRR Gastrointestinal system: BS present, soft, nt Central nervous system:Alert Extremities: Symmetric.    Data Reviewed: I have personally reviewed following labs and imaging studies  CBC: Recent Labs  Lab 02/01/20 0210  WBC 6.4  HGB 10.9*  HCT 34.1*  MCV 85.0  PLT 256   Basic Metabolic Panel: Recent Labs  Lab 01/27/20 0602 01/30/20 0136 02/01/20 1622  NA 138 134* 139  K 4.4 4.4 4.2  CL 102 101 101  CO2 27 19* 26  GLUCOSE 108* 103* 91  BUN 8 10 11   CREATININE 1.04 0.91 1.04  CALCIUM 9.3 9.0 9.5   GFR: Estimated Creatinine Clearance: 90.5 mL/min (by C-G formula based on SCr of 1.04 mg/dL). Liver Function Tests: No results for input(s): AST, ALT, ALKPHOS, BILITOT, PROT, ALBUMIN in the last 168 hours. No results for input(s): LIPASE, AMYLASE in the last 168 hours. No results for input(s): AMMONIA in the last 168 hours. Coagulation Profile: No results for input(s): INR, PROTIME in the last 168 hours. Cardiac Enzymes: No results for input(s): CKTOTAL, CKMB, CKMBINDEX, TROPONINI in the last 168 hours. BNP (last 3 results) No results for input(s): PROBNP in the last 8760 hours. HbA1C: No results for input(s): HGBA1C in the last 72 hours. CBG: No results for input(s): GLUCAP in the last 168 hours. Lipid Profile: No results for input(s): CHOL, HDL, LDLCALC, TRIG, CHOLHDL, LDLDIRECT in the last 72 hours. Thyroid Function Tests: No results for input(s): TSH, T4TOTAL, FREET4, T3FREE, THYROIDAB in the last 72 hours. Anemia  Panel: No results for input(s): VITAMINB12, FOLATE, FERRITIN, TIBC, IRON, RETICCTPCT in the last 72 hours. Sepsis Labs: No results for input(s): PROCALCITON, LATICACIDVEN in the last 168 hours.  No results found for this or any previous visit (from the past 240 hour(s)).       Radiology Studies: No results found.      Scheduled Meds: . benztropine  1 mg Oral Daily  . buPROPion  150 mg Oral Daily  . enoxaparin (LOVENOX) injection  40 mg Subcutaneous Q24H  . ferrous sulfate  325 mg Oral Q breakfast  . gabapentin  300 mg Oral BID  . lidocaine  1 patch Transdermal Q24H  . metoprolol succinate  12.5 mg Oral Daily  . mirtazapine  15 mg Oral QHS  . nicotine  14 mg Transdermal Daily  . pantoprazole  40 mg Oral Daily  . saccharomyces boulardii  250 mg Oral BID  . senna-docusate  1 tablet Oral BID  . sodium chloride flush  3 mL Intravenous Q12H  . vitamin B-12  100 mcg Oral Daily   Continuous Infusions: . sodium chloride 250 mL (01/30/20 1407)  . ceFEPime (MAXIPIME) IV 2 g (02/02/20 0602)  . vancomycin 1,000 mg (02/02/20 0451)     LOS: 26 days    Time spent: 35 minutes.     Alba Cory, MD [facility]   If 7PM-7AM, please contact night-coverage [email or web address]  02/02/2020, 1:34 PM

## 2020-02-02 NOTE — Plan of Care (Signed)
  Problem: Pain Managment: Goal: General experience of comfort will improve Outcome: Progressing   

## 2020-02-03 ENCOUNTER — Inpatient Hospital Stay (HOSPITAL_COMMUNITY): Payer: Medicare Other

## 2020-02-03 ENCOUNTER — Encounter (HOSPITAL_COMMUNITY): Payer: Self-pay | Admitting: Internal Medicine

## 2020-02-03 DIAGNOSIS — M4626 Osteomyelitis of vertebra, lumbar region: Secondary | ICD-10-CM | POA: Diagnosis not present

## 2020-02-03 MED ORDER — GADOBUTROL 1 MMOL/ML IV SOLN
10.0000 mL | Freq: Once | INTRAVENOUS | Status: AC | PRN
  Administered 2020-02-03: 9 mL via INTRAVENOUS

## 2020-02-03 NOTE — Progress Notes (Signed)
PROGRESS NOTE    [name]  BJY:[id number] DOB: 09-Aug-1965 DOA: 01/06/2020 PCP: [name], MD   Brief Narrative: 54 year old with past medical history significant for discitis (April 2021 treated with 6 weeks of vancomycin and ceftriaxone, cultures unrevealing), status post laminectomy, history of schizophrenia and ongoing cocaine use who presented to the ED complaining of shoulder pain and back pain after a mechanical fall 4 days prior to admission.  -Evaluation in the ED imaging noted isolated rib fracture, LS spine show possible progressive discitis osteomyelitis.  MRI shows septic arthritis at L3-4, associated epidural phlegmon and enhancement extending from T12-L1.  Resultant moderate to severe diffuse spinal stenosis extending from L1-L2.  Improved discitis at L1-2. Patient was evaluated by neurosurgery, who recommended medical treatment and IR guided disc space biopsy to guide  antibiotics.  Attempt at aspiration unsuccessful, now on broad-spectrum antibiotics for 6 weeks. -Remain inpatient to complete IV antibiotic therapy.   Assessment & Plan:   Principal Problem:   Acute osteomyelitis of lumbar spine (HCC) Active Problems:   Cocaine abuse (HCC)   Benign essential HTN   History of CVA (cerebrovascular accident)   Chronic pain syndrome   Closed fracture of one rib of left side   Discitis of lumbar region   Ankle ankylosis, left    1-Recurrent discitis/osteomyelitis of L3-4, associated epidural phlegmon: -Previous history of discitis completed 6 weeks course of IV ceftriaxone and vancomycin early June inpatient. -Failed attempt at disc aspiration and biopsy due to severe disc space narrowing. -Patient was initially treated with IV ceftriaxone and vancomycin, subsequently ceftriaxone was changed to cefepime on 10/14.  Continue with vancomycin. -Continue with pain management, Flexeril pain, morphine immediate release -Continue with IV vancomycin and cefepime to  complete 6 weeks of antibiotics course on 11/21st -Will need to asked ID when to repeat MRI, and if patient need oral antibiotics at discharge.  -WBC normal, CRP 0.7 from 2.1  -Continue with current management.  -He report over all back pain has been improving. Denies worsening pain.   2-Hypertension: Continue with Toprol.  3-History of ongoing cocaine, prior ETOH use: -Counseled.   4-COPD: Continue with as needed nebulizer. Nicotine patch.  5-Schizophrenia: On Invega every 3 months, continue with Cogentin, Wellbutrin and Remeron. Invega next dose due 03/10/2020. He last dose of invega was on September 9.   6-Mild hyponatremia: Resolved.  7-Anemia of chronic disease: Stable. Low B12, on supplement.   8-Acute nondisplaced fracture, left ninth rib: Pain management incentive spirometry.  9-GERD: Continue with PPI. 10-Right shoulder pain; will get MRI   Estimated body mass index is 29.66 kg/m as calculated from the following:   Height as of this encounter: 5\' 9"  (1.753 m).   Weight as of this encounter: 91.1 kg.   DVT prophylaxis: Lovenox Code Status: Full code Family Communication: Care discussed with patient. Fiance 11/05 Disposition Plan:  Status is: Inpatient  Remains inpatient appropriate because:IV treatments appropriate due to intensity of illness or inability to take PO   Dispo: The patient is from: Home              Anticipated d/c is to: Home              Anticipated d/c date is: > 3 days              Patient currently is not medically stable to d/c. Needs to complete IV antibiotics for recurrent Discitis.         Consultants:   Neurosurgery  ID  IR  Procedures:   Unsuccessful disc aspiration lumbar spine  Antimicrobials:  Vancomycin and cefepime  Subjective: He report back pain has been getting better over time.  He is complaining of right side shoulder pain   Objective: Vitals:   02/02/20 0958 02/02/20 2015 02/03/20 0438 02/03/20  1015  BP: 112/81 129/90 104/70 106/87  Pulse: 89 92 95 (!) 110  Resp: 16 16 16 18   Temp: 98 F (36.7 C) 98.6 F (37 C) 98.3 F (36.8 C) 99.1 F (37.3 C)  TempSrc: Oral Oral Oral Oral  SpO2: 97% 100% 95% 100%  Weight:  91.1 kg    Height:        Intake/Output Summary (Last 24 hours) at 02/03/2020 1313 Last data filed at 02/03/2020 1000 Gross per 24 hour  Intake 1980 ml  Output 1100 ml  Net 880 ml   Filed Weights   01/28/20 2047 02/01/20 2107 02/02/20 2015  Weight: 91.1 kg 91 kg 91.1 kg    Examination:  General exam: NAD Respiratory system: CTA Cardiovascular system: S 1, S 2 RRR Gastrointestinal system: BS present, soft, nt Central nervous system: Aler Extremities: no edema   Data Reviewed: I have personally reviewed following labs and imaging studies  CBC: Recent Labs  Lab 02/01/20 0210  WBC 6.4  HGB 10.9*  HCT 34.1*  MCV 85.0  PLT 256   Basic Metabolic Panel: Recent Labs  Lab 01/30/20 0136 02/01/20 1622  NA 134* 139  K 4.4 4.2  CL 101 101  CO2 19* 26  GLUCOSE 103* 91  BUN 10 11  CREATININE 0.91 1.04  CALCIUM 9.0 9.5   GFR: Estimated Creatinine Clearance: 90.6 mL/min (by C-G formula based on SCr of 1.04 mg/dL). Liver Function Tests: No results for input(s): AST, ALT, ALKPHOS, BILITOT, PROT, ALBUMIN in the last 168 hours. No results for input(s): LIPASE, AMYLASE in the last 168 hours. No results for input(s): AMMONIA in the last 168 hours. Coagulation Profile: No results for input(s): INR, PROTIME in the last 168 hours. Cardiac Enzymes: No results for input(s): CKTOTAL, CKMB, CKMBINDEX, TROPONINI in the last 168 hours. BNP (last 3 results) No results for input(s): PROBNP in the last 8760 hours. HbA1C: No results for input(s): HGBA1C in the last 72 hours. CBG: No results for input(s): GLUCAP in the last 168 hours. Lipid Profile: No results for input(s): CHOL, HDL, LDLCALC, TRIG, CHOLHDL, LDLDIRECT in the last 72 hours. Thyroid Function  Tests: No results for input(s): TSH, T4TOTAL, FREET4, T3FREE, THYROIDAB in the last 72 hours. Anemia Panel: No results for input(s): VITAMINB12, FOLATE, FERRITIN, TIBC, IRON, RETICCTPCT in the last 72 hours. Sepsis Labs: No results for input(s): PROCALCITON, LATICACIDVEN in the last 168 hours.  No results found for this or any previous visit (from the past 240 hour(s)).       Radiology Studies: No results found.      Scheduled Meds: . benztropine  1 mg Oral Daily  . buPROPion  150 mg Oral Daily  . enoxaparin (LOVENOX) injection  40 mg Subcutaneous Q24H  . ferrous sulfate  325 mg Oral Q breakfast  . gabapentin  300 mg Oral BID  . lidocaine  1 patch Transdermal Q24H  . metoprolol succinate  12.5 mg Oral Daily  . mirtazapine  15 mg Oral QHS  . nicotine  14 mg Transdermal Daily  . pantoprazole  40 mg Oral Daily  . saccharomyces boulardii  250 mg Oral BID  . senna-docusate  1 tablet Oral BID  .  sodium chloride flush  3 mL Intravenous Q12H  . vitamin B-12  100 mcg Oral Daily   Continuous Infusions: . sodium chloride 250 mL (01/30/20 1407)  . ceFEPime (MAXIPIME) IV 2 g (02/03/20 8469)  . vancomycin 1,000 mg (02/03/20 0400)     LOS: 27 days    Time spent: 35 minutes.     Alba Cory, MD [facility]   If 7PM-7AM, please contact night-coverage [email or web address]  02/03/2020, 1:13 PM

## 2020-02-03 NOTE — Plan of Care (Signed)
  Problem: Health Behavior/Discharge Planning: Goal: Ability to manage health-related needs will improve Outcome: Progressing   Problem: Clinical Measurements: Goal: Will remain free from infection Outcome: Progressing   Problem: Pain Managment: Goal: General experience of comfort will improve Outcome: Progressing   

## 2020-02-04 ENCOUNTER — Inpatient Hospital Stay (HOSPITAL_COMMUNITY): Payer: Medicare Other

## 2020-02-04 DIAGNOSIS — M4626 Osteomyelitis of vertebra, lumbar region: Secondary | ICD-10-CM | POA: Diagnosis not present

## 2020-02-04 LAB — BASIC METABOLIC PANEL
Anion gap: 8 (ref 5–15)
BUN: 9 mg/dL (ref 6–20)
CO2: 25 mmol/L (ref 22–32)
Calcium: 9 mg/dL (ref 8.9–10.3)
Chloride: 103 mmol/L (ref 98–111)
Creatinine, Ser: 0.95 mg/dL (ref 0.61–1.24)
GFR, Estimated: >60 mL/min (ref >60)
Glucose, Bld: 113 mg/dL — ABNORMAL HIGH (ref 70–99)
Potassium: 3.8 mmol/L (ref 3.5–5.1)
Sodium: 136 mmol/L (ref 135–145)

## 2020-02-04 LAB — SYNOVIAL CELL COUNT + DIFF, W/ CRYSTALS
Crystals, Fluid: NONE SEEN
Eosinophils-Synovial: 1 % (ref 0–1)
Lymphocytes-Synovial Fld: 18 % (ref 0–20)
Monocyte-Macrophage-Synovial Fluid: 46 % — ABNORMAL LOW (ref 50–90)
Neutrophil, Synovial: 35 % — ABNORMAL HIGH (ref 0–25)
WBC, Synovial: 570 /mm3 — ABNORMAL HIGH (ref 0–200)

## 2020-02-04 LAB — PROTIME-INR
INR: 0.9 (ref 0.8–1.2)
Prothrombin Time: 12.1 seconds (ref 11.4–15.2)

## 2020-02-04 NOTE — Progress Notes (Signed)
[facility] for Infectious Disease    Date of Admission:  01/06/2020   Total days of antibiotics 27           ID: [name] is a 54 y.o. male with recurrent and advancing vertebral infection now involving T12-L1, L1-L2 and L3-L4 with paraspinous edema/myositis w/o abscess but circumferential epidural phlegmon T12-L1 with prior E cloacae in hardware associated foot infection. Currently on vancomycin plus cefepime. Has developed right shoulder effusion with aspirate showing low level amount of cells, c/w inflammation but cultures are pending  Principal Problem:   Acute osteomyelitis of lumbar spine (HCC) Active Problems:   Cocaine abuse (HCC)   Benign essential HTN   History of CVA (cerebrovascular accident)   Chronic pain syndrome   Closed fracture of one rib of left side   Discitis of lumbar region   Ankle ankylosis, left    Subjective: Afebrile, underwent right shoulder joint aspiration without difficulty. He is noticing that analgesics are wearing off. Back pain slightly improved  Medications:  . benztropine  1 mg Oral Daily  . buPROPion  150 mg Oral Daily  . enoxaparin (LOVENOX) injection  40 mg Subcutaneous Q24H  . ferrous sulfate  325 mg Oral Q breakfast  . gabapentin  300 mg Oral BID  . lidocaine  1 patch Transdermal Q24H  . metoprolol succinate  12.5 mg Oral Daily  . mirtazapine  15 mg Oral QHS  . nicotine  14 mg Transdermal Daily  . pantoprazole  40 mg Oral Daily  . saccharomyces boulardii  250 mg Oral BID  . senna-docusate  1 tablet Oral BID  . sodium chloride flush  3 mL Intravenous Q12H  . vitamin B-12  100 mcg Oral Daily    Objective: Vital signs in last 24 hours: Temp:  [97.7 F (36.5 C)-99.1 F (37.3 C)] 98 F (36.7 C) (11/07 0952) Pulse Rate:  [93-103] 100 (11/07 0952) Resp:  [18] 18 (11/07 0952) BP: (107-140)/(65-95) 107/72 (11/07 0952) SpO2:  [97 %-100 %] 97 % (11/07 0952) Weight:  [90.5 kg] 90.5 kg (11/06 2040) Physical Exam    Constitutional: He is oriented to person, place, and time. He appears well-developed and well-nourished. No distress.  HENT:  Mouth/Throat: Oropharynx is clear and moist. No oropharyngeal exudate.  Cardiovascular: Normal rate, regular rhythm and normal heart sounds. Exam reveals no gallop and no friction rub.  No murmur heard.  Pulmonary/Chest: Effort normal and breath sounds normal. No respiratory distress. He has no wheezes.  Abdominal: Soft. Bowel sounds are normal. He exhibits no distension. There is no tenderness.  Lymphadenopathy:  He has no cervical adenopathy.  Neurological: He is alert and oriented to person, place, and time.  Skin: Skin is warm and dry. No rash noted. No erythema.  Psychiatric: He has a normal mood and affect. His behavior is normal.     Lab Results Recent Labs    02/01/20 1622 02/04/20 0438  NA 139 136  K 4.2 3.8  CL 101 103  CO2 26 25  BUN 11 9  CREATININE 1.04 0.95   Lab Results  Component Value Date   ESRSEDRATE 38 (H) 01/12/2020   POCTSEDRATE 15 04/17/2014   Lab Results  Component Value Date   CRP 0.7 02/01/2020    Microbiology: 10/11 disc fluid NGTD 11/7 shoulder aspirate PENDING Studies/Results: MR SHOULDER RIGHT W WO CONTRAST  Result Date: 02/03/2020 CLINICAL DATA:  Right shoulder pain after fall 4 days ago. History of osteomyelitis discitis. EXAM: MRI  OF THE RIGHT SHOULDER WITHOUT AND WITH CONTRAST TECHNIQUE: Multiplanar, multisequence MR imaging of the right shoulder was performed before and after the administration of intravenous contrast. CONTRAST:  49mL GADAVIST GADOBUTROL 1 MMOL/ML IV SOLN COMPARISON:  Right shoulder x-rays dated January 06, 2020. FINDINGS: Rotator cuff: Chronic full-thickness, full width tears of the supraspinatus and infraspinatus tendons with retraction to the glenoid. Subscapularis tendinosis without tear. The teres minor tendon is unremarkable. Muscles: Moderate supraspinatus and severe infraspinatus muscle  atrophy. Mild supraspinatus, infraspinatus, and subscapularis muscle edema. Biceps long head: Intact and normally positioned. Severe tendinosis. Acromioclavicular Joint: Mild arthropathy of the acromioclavicular joint. Type II acromion. Large amount of subacromial/subdeltoid bursal fluid with prominent synovial thickening and enhancement. Glenohumeral Joint: Large joint effusion with prominent synovial thickening and enhancement. Large areas of full-thickness cartilage loss over the humeral head and glenoid. Labrum:  Diffusely degenerated and torn. Bones: Slight depression of the superomedial humeral head with underlying subchondral marrow edema and enhancement extending into the intramedullary proximal humeral metadiaphysis. Patchy marrow edema and enhancement of the glenoid with decreased T1 marrow signal. No acute fracture or dislocation. No suspicious bone lesion. Other: None. IMPRESSION: 1. Large glenohumeral joint effusion with prominent synovial thickening and enhancement, highly concerning for septic arthritis. 2. Findings suspicious for early osteomyelitis of the proximal humerus and glenoid. 3. Chronic full-thickness, full width tears of the supraspinatus and infraspinatus tendons with retraction to the glenoid. Moderate supraspinatus and severe infraspinatus muscle atrophy. Electronically Signed   By: Titus Dubin M.D.   On: 02/03/2020 16:44   DG FLUORO GUIDED NEEDLE PLC ASPIRATION/INJECTION LOC  Result Date: 02/04/2020 CLINICAL DATA:  Right shoulder effusion. EXAM: RIGHT SHOULDER ASPIRATION UNDER FLUOROSCOPY COMPARISON:  MRI 02/03/2020 FLUOROSCOPY TIME:  Fluoroscopy Time:  42 seconds Radiation Exposure Index (if provided by the fluoroscopic device): 2.9 Number of Acquired Spot Images: 0 PROCEDURE: Overlying skin prepped with Betadine, draped in the usual sterile fashion, and infiltrated locally with XYLOCAINE. Curved 20 gauge spinal needle advanced to the superomedial margin of the right humeral  head. 1 ml of Lidocaine injected easily. 28 cc of serosanguineous fluid was removed from the right shoulder joint and sent to the laboratory for analysis. IMPRESSION: 1. Technically successful right shoulder aspiration under fluoroscopy. 2. 28 cc of serosanguineous fluid was aspirated and sent to the lab for studies. Electronically Signed   By: Kerby Moors M.D.   On: 02/04/2020 11:44     Assessment/Plan: thoraco-lumbar discitis with epidural phlegmon = currently on vancomycin and cefepime, currently on day 27 of 42. Continue the course for now  Right shoulder joint effusion = preliminary cell count does not suggest that this is septic arthritis. Will continue to follow culture results.  Long term medication management = cr appears stable, will check cbc with diff tomorrow  [facility] Cell: [phone] Pager: [phone]  02/04/2020, 1:44 PM

## 2020-02-04 NOTE — Plan of Care (Signed)
  Problem: Health Behavior/Discharge Planning: Goal: Ability to manage health-related needs will improve 02/04/2020 0105 by Suzy Bouchard, RN Outcome: Progressing 02/03/2020 2057 by Suzy Bouchard, RN Outcome: Progressing   Problem: Clinical Measurements: Goal: Will remain free from infection 02/04/2020 0105 by Suzy Bouchard, RN Outcome: Progressing 02/03/2020 2057 by Suzy Bouchard, RN Outcome: Progressing   Problem: Pain Managment: Goal: General experience of comfort will improve 02/04/2020 0105 by Suzy Bouchard, RN Outcome: Progressing 02/03/2020 2057 by Suzy Bouchard, RN Outcome: Progressing

## 2020-02-04 NOTE — Progress Notes (Signed)
Pharmacy Antibiotic Note  [name] is a 54 y.o. male admitted on 01/06/2020 with prior history of lumbar osteomyelitis/discitis in May-June 2021 now with back pain post fall at home and MRI showing lumbar osteomyelitis.  Neurosurgery unable to get successful disc aspiration during biopsy. Patient with history of L ankle Enterobacter infection s/p hardware removal in 2019; ortho saw patient here, possible chronic vs advancing osteo. Pharmacy has been consulted for Vancomycin and Cefepime dosing..  Plan is for 6 wks of IV abx (Vanc/Cefepime, thru 02/18/20), then consolidate to PO doxycycline and cephalexin for another month.   The patient's SCr today is stable - 0.95 Last Vanc trough = 17 mcg/ml on 11/4, at goal.  Plan: Continue Vancomycin 1 gm IV q12hr. Continue Cefepime 2 gm IV q8hr. Bmet at least q72h while on Vanc. Next 11/10. Vanc trough weekly, or as indicated.  Target vanc troughs 15-20 mcg/ml.  Height: 5\' 9"  (175.3 cm) Weight: 90.5 kg (199 lb 8.3 oz) IBW/kg (Calculated) : 70.7  Temp (24hrs), Avg:98.2 F (36.8 C), Min:97.7 F (36.5 C), Max:99.1 F (37.3 C)  Recent Labs  Lab 01/30/20 0136 02/01/20 0210 02/01/20 1611 02/01/20 1622 02/04/20 0438  WBC  --  6.4  --   --   --   CREATININE 0.91  --   --  1.04 0.95  VANCOTROUGH  --   --  17  --   --     Estimated Creatinine Clearance: 98.8 mL/min (by C-G formula based on SCr of 0.95 mg/dL).    Allergies  Allergen Reactions  . Chlorhexidine Itching and Rash    Antimicrobials this admission: Ceftriaxone 10/11 >> 10/14 Vancomycin 10/11 >> (11/21) Cefepime 10/14 >> (11/21) Keflex/Doxy to begin 11/22 for a month >> (not entered yet)  Dose adjustments / vancomycin trough levels: 10/15 VT = 22 mcg/ml but dose prior PM dose was given ~4 hours late so true trough ~15 mcg/ml (therapeutic) on 1500 mg IV q12h> no change 10/21 VT = 23 mcg/ml , drawn ~50 min early, on 1500 mg IV q12h > decr to 1250 mg IV q12h 10/25 VT= 21  mcg/ml - on 1250 mg IV q12h > decr to 1gm IV q12h 10/28 VT = 17 mcg/ml - continue 1 gm IV q12h 11/4 VT = 17 mg/L - continue 1 gm IV q12h   Microbiology results: 10/11 fluid, disc L1 cx: negative 10/11 Blood x 2: negative 10/10 COVID, flu: neg  Thank you for allowing pharmacy to be a part of this patient's care.  Arty Baumgartner, [place] Phone [phone] 02/04/2020 1:56 PM   **Pharmacist phone directory can now be found on [email or web address] ([email or web address]).  Listed under [place].

## 2020-02-04 NOTE — Progress Notes (Signed)
PROGRESS NOTE    [name]  UUV:[id number] DOB: 10-Apr-1965 DOA: 01/06/2020 PCP: [name], MD   Brief Narrative: 54 year old with past medical history significant for discitis (April 2021 treated with 6 weeks of vancomycin and ceftriaxone, cultures unrevealing), status post laminectomy, history of schizophrenia and ongoing cocaine use who presented to the ED complaining of shoulder pain and back pain after a mechanical fall 4 days prior to admission.  -Evaluation in the ED imaging noted isolated rib fracture, LS spine show possible progressive discitis osteomyelitis.  MRI shows septic arthritis at L3-4, associated epidural phlegmon and enhancement extending from T12-L1.  Resultant moderate to severe diffuse spinal stenosis extending from L1-L2.  Improved discitis at L1-2. Patient was evaluated by neurosurgery, who recommended medical treatment and IR guided disc space biopsy to guide  antibiotics.  Attempt at aspiration unsuccessful, now on broad-spectrum antibiotics for 6 weeks. -Remain inpatient to complete IV antibiotic therapy.  Reporter worsening shoulder pain. Underwent MRI (11/07)  which showed large glenohumeral joint effusion, rotator cuff tear, and possible earlier finding of osteomyelitis humerus.     Assessment & Plan:   Principal Problem:   Acute osteomyelitis of lumbar spine (HCC) Active Problems:   Cocaine abuse (HCC)   Benign essential HTN   History of CVA (cerebrovascular accident)   Chronic pain syndrome   Closed fracture of one rib of left side   Discitis of lumbar region   Ankle ankylosis, left    1-Recurrent discitis/osteomyelitis of L3-4, associated epidural phlegmon: -Previous history of discitis completed 6 weeks course of IV ceftriaxone and vancomycin early June inpatient. -Failed attempt at disc aspiration due to severe disc space narrowing. Had core Biopsy 10-11: disc L1 fluid; no growth.  -Patient was initially treated with IV ceftriaxone and  vancomycin, subsequently ceftriaxone was changed to cefepime on 10/14.  Continue with vancomycin. -Continue with pain management, Flexeril pain, morphine immediate release -Continue with IV vancomycin and cefepime to complete 6 weeks of antibiotics course on 11/21st -WBC normal, CRP 0.7 from 2.1  -Continue with current management.  -He report back pain has been improving. Denies worsening pain.  -ID will follow up on patient, and will give recommendation when to repeat MRI Lumbar spine, would the patient needs oral antibiotics at discharge?   2-Large glenohumeral joint effusion, suspicious of early osteomyelitis of the proximal humerus, rotator cuff tear: -Discussed MRI results with Dr Ophelia Charter. He recommend IR arthrocentesis. Dr Otelia Sergeant will follow up on patient 11/08. -Continue with IV antibiotics.  -Arthrocentesis today.   3-History of ongoing cocaine, prior ETOH use: -Counseled.   4-COPD: Continue with as needed nebulizer. Nicotine patch.  5-Schizophrenia: On Invega every 3 months, continue with Cogentin, Wellbutrin and Remeron. Invega next dose due 03/10/2020. He last dose of invega was on September 9.   6-Mild hyponatremia: Resolved.  7-Anemia of chronic disease: Stable. Low B12, on supplement.   8-Acute nondisplaced fracture, left ninth rib: Pain management incentive spirometry.  9-GERD: Continue with PPI. 10-Hypertension: Continue with Toprol.  Estimated body mass index is 29.46 kg/m as calculated from the following:   Height as of this encounter: 5\' 9"  (1.753 m).   Weight as of this encounter: 90.5 kg.   DVT prophylaxis: Lovenox Code Status: Full code Family Communication: Care discussed with patient. Fiance 11/07 Disposition Plan:  Status is: Inpatient  Remains inpatient appropriate because:IV treatments appropriate due to intensity of illness or inability to take PO   Dispo: The patient is from: Home  Anticipated d/c is to: Home               Anticipated d/c date is: > 3 days              Patient currently is not medically stable to d/c. Needs to complete IV antibiotics for recurrent Discitis.         Consultants:   Neurosurgery  ID  IR  Procedures:   Unsuccessful disc aspiration lumbar spine  Antimicrobials:  Vancomycin and cefepime  Subjective: He report back pain is not worse, same.  Complaints of shoulder pain.   Objective: Vitals:   02/03/20 1015 02/03/20 1651 02/03/20 2040 02/04/20 0504  BP: 106/87 138/65 (!) 140/95 124/85  Pulse: (!) 110 (!) 101 (!) 103 93  Resp: 18 18 18 18   Temp: 99.1 F (37.3 C) 99.1 F (37.3 C) 97.7 F (36.5 C) 97.9 F (36.6 C)  TempSrc: Oral Oral Oral   SpO2: 100% 100% 99% 98%  Weight:   90.5 kg   Height:        Intake/Output Summary (Last 24 hours) at 02/04/2020 0827 Last data filed at 02/04/2020 0600 Gross per 24 hour  Intake 2702.47 ml  Output 700 ml  Net 2002.47 ml   Filed Weights   02/01/20 2107 02/02/20 2015 02/03/20 2040  Weight: 91 kg 91.1 kg 90.5 kg    Examination:  General exam: NAD Respiratory system: CTA Cardiovascular system: S 1, S 2 RRR Gastrointestinal system: BS present, soft, nt Central nervous system: Aler Extremities: no edema   Data Reviewed: I have personally reviewed following labs and imaging studies  CBC: Recent Labs  Lab 02/01/20 0210  WBC 6.4  HGB 10.9*  HCT 34.1*  MCV 85.0  PLT 256   Basic Metabolic Panel: Recent Labs  Lab 01/30/20 0136 02/01/20 1622 02/04/20 0438  NA 134* 139 136  K 4.4 4.2 3.8  CL 101 101 103  CO2 19* 26 25  GLUCOSE 103* 91 113*  BUN 10 11 9   CREATININE 0.91 1.04 0.95  CALCIUM 9.0 9.5 9.0   GFR: Estimated Creatinine Clearance: 98.8 mL/min (by C-G formula based on SCr of 0.95 mg/dL). Liver Function Tests: No results for input(s): AST, ALT, ALKPHOS, BILITOT, PROT, ALBUMIN in the last 168 hours. No results for input(s): LIPASE, AMYLASE in the last 168 hours. No results for input(s):  AMMONIA in the last 168 hours. Coagulation Profile: No results for input(s): INR, PROTIME in the last 168 hours. Cardiac Enzymes: No results for input(s): CKTOTAL, CKMB, CKMBINDEX, TROPONINI in the last 168 hours. BNP (last 3 results) No results for input(s): PROBNP in the last 8760 hours. HbA1C: No results for input(s): HGBA1C in the last 72 hours. CBG: No results for input(s): GLUCAP in the last 168 hours. Lipid Profile: No results for input(s): CHOL, HDL, LDLCALC, TRIG, CHOLHDL, LDLDIRECT in the last 72 hours. Thyroid Function Tests: No results for input(s): TSH, T4TOTAL, FREET4, T3FREE, THYROIDAB in the last 72 hours. Anemia Panel: No results for input(s): VITAMINB12, FOLATE, FERRITIN, TIBC, IRON, RETICCTPCT in the last 72 hours. Sepsis Labs: No results for input(s): PROCALCITON, LATICACIDVEN in the last 168 hours.  No results found for this or any previous visit (from the past 240 hour(s)).       Radiology Studies: MR SHOULDER RIGHT W WO CONTRAST  Result Date: 02/03/2020 CLINICAL DATA:  Right shoulder pain after fall 4 days ago. History of osteomyelitis discitis. EXAM: MRI OF THE RIGHT SHOULDER WITHOUT AND WITH CONTRAST TECHNIQUE: Multiplanar,  multisequence MR imaging of the right shoulder was performed before and after the administration of intravenous contrast. CONTRAST:  9mL GADAVIST GADOBUTROL 1 MMOL/ML IV SOLN COMPARISON:  Right shoulder x-rays dated January 06, 2020. FINDINGS: Rotator cuff: Chronic full-thickness, full width tears of the supraspinatus and infraspinatus tendons with retraction to the glenoid. Subscapularis tendinosis without tear. The teres minor tendon is unremarkable. Muscles: Moderate supraspinatus and severe infraspinatus muscle atrophy. Mild supraspinatus, infraspinatus, and subscapularis muscle edema. Biceps long head: Intact and normally positioned. Severe tendinosis. Acromioclavicular Joint: Mild arthropathy of the acromioclavicular joint. Type II  acromion. Large amount of subacromial/subdeltoid bursal fluid with prominent synovial thickening and enhancement. Glenohumeral Joint: Large joint effusion with prominent synovial thickening and enhancement. Large areas of full-thickness cartilage loss over the humeral head and glenoid. Labrum:  Diffusely degenerated and torn. Bones: Slight depression of the superomedial humeral head with underlying subchondral marrow edema and enhancement extending into the intramedullary proximal humeral metadiaphysis. Patchy marrow edema and enhancement of the glenoid with decreased T1 marrow signal. No acute fracture or dislocation. No suspicious bone lesion. Other: None. IMPRESSION: 1. Large glenohumeral joint effusion with prominent synovial thickening and enhancement, highly concerning for septic arthritis. 2. Findings suspicious for early osteomyelitis of the proximal humerus and glenoid. 3. Chronic full-thickness, full width tears of the supraspinatus and infraspinatus tendons with retraction to the glenoid. Moderate supraspinatus and severe infraspinatus muscle atrophy. Electronically Signed   By: Obie Dredge M.D.   On: 02/03/2020 16:44        Scheduled Meds: . benztropine  1 mg Oral Daily  . buPROPion  150 mg Oral Daily  . enoxaparin (LOVENOX) injection  40 mg Subcutaneous Q24H  . ferrous sulfate  325 mg Oral Q breakfast  . gabapentin  300 mg Oral BID  . lidocaine  1 patch Transdermal Q24H  . metoprolol succinate  12.5 mg Oral Daily  . mirtazapine  15 mg Oral QHS  . nicotine  14 mg Transdermal Daily  . pantoprazole  40 mg Oral Daily  . saccharomyces boulardii  250 mg Oral BID  . senna-docusate  1 tablet Oral BID  . sodium chloride flush  3 mL Intravenous Q12H  . vitamin B-12  100 mcg Oral Daily   Continuous Infusions: . sodium chloride 250 mL (01/30/20 1407)  . ceFEPime (MAXIPIME) IV 2 g (02/04/20 0538)  . vancomycin 1,000 mg (02/04/20 0327)     LOS: 28 days    Time spent: 35 minutes.      Alba Cory, MD [facility]   If 7PM-7AM, please contact night-coverage [email or web address]  02/04/2020, 8:27 AM

## 2020-02-05 DIAGNOSIS — M4626 Osteomyelitis of vertebra, lumbar region: Secondary | ICD-10-CM | POA: Diagnosis not present

## 2020-02-05 MED ORDER — DICLOFENAC SODIUM 1 % EX GEL
2.0000 g | Freq: Four times a day (QID) | CUTANEOUS | Status: DC
  Administered 2020-02-05 – 2020-02-20 (×52): 2 g via TOPICAL
  Filled 2020-02-05 (×2): qty 100

## 2020-02-05 NOTE — Plan of Care (Signed)
  Problem: Pain Managment: Goal: General experience of comfort will improve Outcome: Progressing   

## 2020-02-05 NOTE — Progress Notes (Signed)
Subjective:     Patient reports pain as moderate.    Objective: 54 year old male native [place] with history of L4-5 decompression and fusion and microdiscectomy L3-4  03/2019. He did well post op and continued to request narcotics. These were discontinued in later part of 05/2019. He unfortunately has been poor to return for follow up appointments and presented to  [facility] obtunded and admitted for assessment of altered mental status. He had apparently returned to use of illicit drugs. While hospitalized a CT of the lumbar Spine demonstrated L1-2 finding of disc end plate erosive changes with edema within the para spinous muscles adjacent to L1-2 with edema within the adjacent vertebral bodies of L1 and L2. There was anterior spinal canal phlegmon with moderate compression, no abscess or drainable collection of fluid noted. He was Kept in the hospital for a 6 week course of IV antibiotics and completed the course  At [facility].  He is now nearly 54 days into admission to [facility] for back pain and left  Ankle pain. MRI with collapse of the disc at L1-2 and autofusion of this segment. This weekend complaining of right shoulder pain that has been worse since a fall he had prior to admission where he was making a sandwich at 3 Am an he passed out falling backward and onto the right shoulder. He reports pain and dicomfort with ROM right shoulder.    VITALS:  Temp:  [97.9 F (36.6 C)-98.1 F (36.7 C)] 98.1 F (36.7 C) (11/08 1735) Pulse Rate:  [93-107] 107 (11/08 1735) Resp:  [17-18] 18 (11/08 1735) BP: (116-140)/(74-101) 140/99 (11/08 1735) SpO2:  [99 %-100 %] 100 % (11/08 1735)  Neurologically intact ABD soft Neurovascular intact Sensation intact distally Intact pulses distally Dorsiflexion/Plantar flexion intact Compartment soft Right shoulder with effusion. Not warm to touch. AROM is present and there is palpable popping of the right shoulder with ROM.  Sed rate 38  CRP 1.7 WBC normal, HGA1c  6.6, BS 108-114 LABS No results for input(s): HGB, WBC, PLT in the last Brian hours. Recent Labs    02/04/20 0438  NA 136  K 3.8  CL 103  CO2 25  BUN 9  CREATININE 0.95  GLUCOSE 113*   Recent Labs    02/04/20 1831  INR 0.9   MRI right shoulder reviewed show a wedge shape area of edema within the right medial inferior humeral head with joint line depression and loss of chondral surface. Aspiration of the right shoulder via interventional radiology with cloudy amber synovial fluid 551 WBC per cc, predominantly lymphocytes and macrophils.   Assessment/Plan: Fluid aspirate suggest inflamatory reaction. MRI is showing a large fluid collection, edema within the humeral head is in pattern seen with AVN Avascular necrosis of the humeral head. With history of ETOH he is at high risk of  AVN. Also he is at risk of poor immune response though when he had infection of The left ankle his CRP was 12.7 and he had an increased Sed Rate to 78. Will follow  And await culture results.      Advance diet Up with therapy  Have therapy work with shoulder use transdermal voltaren gel. May consider using low dose of toradol for arthrosis pain.  Treatment for AVN is usually a shoulder replacement but considering the risk of Infection due to recent discitis and ankle infections he is not a good surgical candidate. I would wait at least a year from the time of infection  treatment with normalization of  Sed rate before considering a shoulder replacement for AVN. I will discuss this with my Partners as I am not performing TSRs (total shoulder replacements)  Vira Browns 02/05/2020, 8:57 PMPatient ID: [name], male   DOB: 1965-04-22, 54 y.o.   MRN: [id number]

## 2020-02-05 NOTE — Plan of Care (Signed)
  Problem: Health Behavior/Discharge Planning: Goal: Ability to manage health-related needs will improve Outcome: Progressing   

## 2020-02-05 NOTE — Progress Notes (Signed)
PROGRESS NOTE    [name]  UXL:[id number] DOB: 1965-04-20 DOA: 01/06/2020 PCP: [name], MD   Brief Narrative: 54 year old with past medical history significant for discitis (April 2021 treated with 6 weeks of vancomycin and ceftriaxone, cultures unrevealing), status post laminectomy, history of schizophrenia and ongoing cocaine use who presented to the ED complaining of shoulder pain and back pain after a mechanical fall 4 days prior to admission.  -Evaluation in the ED imaging noted isolated rib fracture, LS spine show possible progressive discitis osteomyelitis.  MRI shows septic arthritis at L3-4, associated epidural phlegmon and enhancement extending from T12-L1.  Resultant moderate to severe diffuse spinal stenosis extending from L1-L2.  Improved discitis at L1-2. Patient was evaluated by neurosurgery, who recommended medical treatment and IR guided disc space biopsy to guide  antibiotics.  Attempt at aspiration unsuccessful, now on broad-spectrum antibiotics for 6 weeks. -Remain inpatient to complete IV antibiotic therapy.  Reporter worsening shoulder pain. Underwent MRI (11/07)  which showed large glenohumeral joint effusion, rotator cuff tear, and possible earlier finding of osteomyelitis humerus.     Assessment & Plan:   Principal Problem:   Acute osteomyelitis of lumbar spine (HCC) Active Problems:   Cocaine abuse (HCC)   Benign essential HTN   History of CVA (cerebrovascular accident)   Chronic pain syndrome   Closed fracture of one rib of left side   Discitis of lumbar region   Ankle ankylosis, left    1-Recurrent discitis/osteomyelitis of L3-4, associated epidural phlegmon: -Previous history of discitis completed 6 weeks course of IV ceftriaxone and vancomycin early June inpatient. -Failed attempt at disc aspiration due to severe disc space narrowing. Had core Biopsy 10-11: disc L1 fluid; no growth.  -Patient was initially treated with IV ceftriaxone and  vancomycin, subsequently ceftriaxone was changed to cefepime on 10/14.  Continue with vancomycin. -Continue with pain management, Flexeril pain, morphine immediate release -Continue with IV vancomycin and cefepime to complete 6 weeks of antibiotics course on 11/21st -WBC normal, CRP 0.7 from 2.1  -Continue with current management.  -He report back pain has been improving. Denies worsening pain.  -ID will follow up on patient, and will give recommendation when to repeat MRI Lumbar spine, would the patient needs oral antibiotics at discharge? Appreciate ID follow up.   2-Large glenohumeral joint effusion, suspicious of early osteomyelitis of the proximal humerus, rotator cuff tear: -Discussed MRI results with Dr Ophelia Charter. He recommend IR arthrocentesis. Dr Otelia Sergeant will follow up on patient 11/08. -Continue with IV antibiotics.  -Arthrocentesis 11/07: Synovial fluids; WBC 570, Neutrophils 35. Culture: No growth.  Awaiting Dr Otelia Sergeant follow up on patient.   3-History of ongoing cocaine, prior ETOH use: -Counseled.   4-COPD: Continue with as needed nebulizer. Nicotine patch.  5-Schizophrenia: On Invega every 3 months, continue with Cogentin, Wellbutrin and Remeron. Invega next dose due 03/10/2020. He last dose of invega was on September 9.   6-Mild hyponatremia: Resolved.  7-Anemia of chronic disease: Stable. Low B12, on supplement.   8-Acute nondisplaced fracture, left ninth rib: Pain management incentive spirometry.  9-GERD: Continue with PPI. 10-Hypertension: Continue with Toprol.  Estimated body mass index is 29.46 kg/m as calculated from the following:   Height as of this encounter: 5\' 9"  (1.753 m).   Weight as of this encounter: 90.5 kg.   DVT prophylaxis: Lovenox Code Status: Full code Family Communication: Care discussed with patient. Fiance 11/07 Disposition Plan:  Status is: Inpatient  Remains inpatient appropriate because:IV treatments appropriate due to intensity  of  illness or inability to take PO   Dispo: The patient is from: Home              Anticipated d/c is to: Home              Anticipated d/c date is: > 3 days              Patient currently is not medically stable to d/c. Needs to complete IV antibiotics for recurrent Discitis.         Consultants:   Neurosurgery  ID  IR  Procedures:   Unsuccessful disc aspiration lumbar spine  Antimicrobials:  Vancomycin and cefepime  Subjective: He is still complaining shoulder pain, warse than yesterday.  Back pain improving slowly.   Objective: Vitals:   02/04/20 1700 02/04/20 2018 02/05/20 0501 02/05/20 0934  BP: (!) 138/98 (!) 141/98 (!) 135/101 116/74  Pulse: 94 95 93 (!) 107  Resp: 15 18 18 17   Temp: 98.3 F (36.8 C) 98.1 F (36.7 C) 97.9 F (36.6 C) 97.9 F (36.6 C)  TempSrc: Oral Oral Oral   SpO2: 100% 100% 100% 99%  Weight:      Height:        Intake/Output Summary (Last 24 hours) at 02/05/2020 1509 Last data filed at 02/05/2020 1000 Gross per 24 hour  Intake 1807.47 ml  Output 1650 ml  Net 157.47 ml   Filed Weights   02/01/20 2107 02/02/20 2015 02/03/20 2040  Weight: 91 kg 91.1 kg 90.5 kg    Examination:  General exam: NAD Respiratory system: CTA Cardiovascular system: S 1, S 2 RRR Gastrointestinal system: BS present, soft, nt  Central nervous system: Alert.  Extremities: No edema   Data Reviewed: I have personally reviewed following labs and imaging studies  CBC: Recent Labs  Lab 02/01/20 0210  WBC 6.4  HGB 10.9*  HCT 34.1*  MCV 85.0  PLT 256   Basic Metabolic Panel: Recent Labs  Lab 01/30/20 0136 02/01/20 1622 02/04/20 0438  NA 134* 139 136  K 4.4 4.2 3.8  CL 101 101 103  CO2 19* 26 25  GLUCOSE 103* 91 113*  BUN 10 11 9   CREATININE 0.91 1.04 0.95  CALCIUM 9.0 9.5 9.0   GFR: Estimated Creatinine Clearance: 98.8 mL/min (by C-G formula based on SCr of 0.95 mg/dL). Liver Function Tests: No results for input(s): AST, ALT,  ALKPHOS, BILITOT, PROT, ALBUMIN in the last 168 hours. No results for input(s): LIPASE, AMYLASE in the last 168 hours. No results for input(s): AMMONIA in the last 168 hours. Coagulation Profile: Recent Labs  Lab 02/04/20 1831  INR 0.9   Cardiac Enzymes: No results for input(s): CKTOTAL, CKMB, CKMBINDEX, TROPONINI in the last 168 hours. BNP (last 3 results) No results for input(s): PROBNP in the last 8760 hours. HbA1C: No results for input(s): HGBA1C in the last 72 hours. CBG: No results for input(s): GLUCAP in the last 168 hours. Lipid Profile: No results for input(s): CHOL, HDL, LDLCALC, TRIG, CHOLHDL, LDLDIRECT in the last 72 hours. Thyroid Function Tests: No results for input(s): TSH, T4TOTAL, FREET4, T3FREE, THYROIDAB in the last 72 hours. Anemia Panel: No results for input(s): VITAMINB12, FOLATE, FERRITIN, TIBC, IRON, RETICCTPCT in the last 72 hours. Sepsis Labs: No results for input(s): PROCALCITON, LATICACIDVEN in the last 168 hours.  Recent Results (from the past 240 hour(s))  Aerobic/Anaerobic Culture (surgical/deep wound)     Status: None (Preliminary result)   Collection Time: 02/04/20 11:21 AM  Specimen: PATH Cytology Misc. fluid; Body Fluid  Result Value Ref Range Status   Specimen Description SYNOVIAL FLUID  Final   Special Requests RIGHT SHOULDER  Final   Gram Stain   Final    RARE WBC PRESENT, PREDOMINANTLY MONONUCLEAR NO ORGANISMS SEEN    Culture   Final    NO GROWTH 1 DAY Performed at [facility], [address] [place], [place] [address]    Report Status PENDING  Incomplete         Radiology Studies: MR SHOULDER RIGHT W WO CONTRAST  Result Date: 02/03/2020 CLINICAL DATA:  Right shoulder pain after fall 4 days ago. History of osteomyelitis discitis. EXAM: MRI OF THE RIGHT SHOULDER WITHOUT AND WITH CONTRAST TECHNIQUE: Multiplanar, multisequence MR imaging of the right shoulder was performed before and after the administration of  intravenous contrast. CONTRAST:  9mL GADAVIST GADOBUTROL 1 MMOL/ML IV SOLN COMPARISON:  Right shoulder x-rays dated January 06, 2020. FINDINGS: Rotator cuff: Chronic full-thickness, full width tears of the supraspinatus and infraspinatus tendons with retraction to the glenoid. Subscapularis tendinosis without tear. The teres minor tendon is unremarkable. Muscles: Moderate supraspinatus and severe infraspinatus muscle atrophy. Mild supraspinatus, infraspinatus, and subscapularis muscle edema. Biceps long head: Intact and normally positioned. Severe tendinosis. Acromioclavicular Joint: Mild arthropathy of the acromioclavicular joint. Type II acromion. Large amount of subacromial/subdeltoid bursal fluid with prominent synovial thickening and enhancement. Glenohumeral Joint: Large joint effusion with prominent synovial thickening and enhancement. Large areas of full-thickness cartilage loss over the humeral head and glenoid. Labrum:  Diffusely degenerated and torn. Bones: Slight depression of the superomedial humeral head with underlying subchondral marrow edema and enhancement extending into the intramedullary proximal humeral metadiaphysis. Patchy marrow edema and enhancement of the glenoid with decreased T1 marrow signal. No acute fracture or dislocation. No suspicious bone lesion. Other: None. IMPRESSION: 1. Large glenohumeral joint effusion with prominent synovial thickening and enhancement, highly concerning for septic arthritis. 2. Findings suspicious for early osteomyelitis of the proximal humerus and glenoid. 3. Chronic full-thickness, full width tears of the supraspinatus and infraspinatus tendons with retraction to the glenoid. Moderate supraspinatus and severe infraspinatus muscle atrophy. Electronically Signed   By: Obie Dredge M.D.   On: 02/03/2020 16:44   DG FLUORO GUIDED NEEDLE PLC ASPIRATION/INJECTION LOC  Result Date: 02/04/2020 CLINICAL DATA:  Right shoulder effusion. EXAM: RIGHT SHOULDER  ASPIRATION UNDER FLUOROSCOPY COMPARISON:  MRI 02/03/2020 FLUOROSCOPY TIME:  Fluoroscopy Time:  42 seconds Radiation Exposure Index (if provided by the fluoroscopic device): 2.9 Number of Acquired Spot Images: 0 PROCEDURE: Overlying skin prepped with Betadine, draped in the usual sterile fashion, and infiltrated locally with XYLOCAINE. Curved 20 gauge spinal needle advanced to the superomedial margin of the right humeral head. 1 ml of Lidocaine injected easily. 28 cc of serosanguineous fluid was removed from the right shoulder joint and sent to the laboratory for analysis. IMPRESSION: 1. Technically successful right shoulder aspiration under fluoroscopy. 2. 28 cc of serosanguineous fluid was aspirated and sent to the lab for studies. Electronically Signed   By: Signa Kell M.D.   On: 02/04/2020 11:44        Scheduled Meds: . benztropine  1 mg Oral Daily  . buPROPion  150 mg Oral Daily  . diclofenac Sodium  2 g Topical QID  . enoxaparin (LOVENOX) injection  40 mg Subcutaneous Q24H  . ferrous sulfate  325 mg Oral Q breakfast  . gabapentin  300 mg Oral BID  . lidocaine  1 patch Transdermal  Q24H  . metoprolol succinate  12.5 mg Oral Daily  . mirtazapine  15 mg Oral QHS  . nicotine  14 mg Transdermal Daily  . pantoprazole  40 mg Oral Daily  . saccharomyces boulardii  250 mg Oral BID  . senna-docusate  1 tablet Oral BID  . sodium chloride flush  3 mL Intravenous Q12H  . vitamin B-12  100 mcg Oral Daily   Continuous Infusions: . sodium chloride 250 mL (01/30/20 1407)  . ceFEPime (MAXIPIME) IV 2 g (02/05/20 0543)  . vancomycin 1,000 mg (02/05/20 0331)     LOS: 29 days    Time spent: 35 minutes.     Alba Cory, MD [facility]   If 7PM-7AM, please contact night-coverage [email or web address]  02/05/2020, 3:09 PM

## 2020-02-05 NOTE — Plan of Care (Signed)
  Problem: Pain Managment: Goal: General experience of comfort will improve 02/05/2020 1424 by Clint Bolder, RN Outcome: Progressing 02/05/2020 1416 by Clint Bolder, RN Outcome: Progressing

## 2020-02-05 NOTE — Progress Notes (Addendum)
   I have seen and examined the patient. I have personally reviewed the clinical findings, laboratory findings, microbiological data and imaging studies. The assessment and treatment plan was discussed with the  Advance Practice Provider, Stephanie Dixon/Gregory Calone. I agree with her/his documentation except following additions/corrections.  Continue Vancomycin and Cefepime as is for discitis/Osteomyelitis and epidural phlegmon and abscess. No surgical intervention done. He will need a repeat MRI of Lumbar spine at the end of 6 weeks ( between 5th -6th  Week pf therapy ) to monitor the resolution of epidural phlegmon extending from T12-L1, epidural abscess at the rt dorsal epidural space at L4-L5.and epidural phlegmon at the L5-S1 and L3-L4. Today is day 25 th of therapy. Rt shoulder synovial fluid with no organisms on Gram stain and cx NGTD. Synovial fluid analysis not s/o septic arthritis. Will monitor cx for final recs. Monitor CBC and CMP while on IV abx. CRP is WNL. Repeat ESR. Ortho evaluation of Rt shoulder for possible surgical repair. A follow up will made with ID when discharged.     , MD [facility]      [facility]  Date of Admission:  01/06/2020      Total days of antibiotics28  Vancomycin + Cefepime            ASSESSMENT: [name] is a 54 y.o. male with vertebral osteomyelitis from T12-L4 with epidural phlegmon. No surgical interventions required and recommended to complete course of IV therapy. Blood cultures and back cultures were all negative.   From a vertebral infection stand point he appears to be doing better - not as much bony tenderness along the spine with palpation/percussion. Moving around well without any neurologic deficits. CRP reduced to normal as of 11/04. Given phlegmon would probably need to repeat MRI T and L spine at end of therapy to ensure resolution  of the epidural infection; this read will certainly continue to show osteomyelitis at the same sites but would suspect this to have a lag to radiographic resolution despite adequate treatment.   Regarding his right unclear as to the etiology. With inflammatory markers resolving, lack of constitutional symptoms and low cell count I would suspect this is not indicative of acute infection.  He does have tendinosis and tear of labrum/rotator cuff.  May be helpful to have orthopedic team see him for consideration of surgical repair/recs.   He is on broad empiric therapy; will continue vancomycin and cefepime and follow cultures to maturity.    PLAN: 1. Continue IV vancomycin and cefepime  2. Will d/w Dr. Manandar re: timing of follow up MRI to eval epidural space  3. Follow shoulder micro  4. Consider ortho evaluation for R shoulder    Principal Problem:   Acute osteomyelitis of lumbar spine (HCC) Active Problems:   Cocaine abuse (HCC)   Benign essential HTN   History of CVA (cerebrovascular accident)   Chronic pain syndrome   Closed fracture of one rib of left side   Discitis of lumbar region   Ankle ankylosis, left   . benztropine  1 mg Oral Daily  . buPROPion  150 mg Oral Daily  . diclofenac Sodium  2 g Topical QID  . enoxaparin (LOVENOX) injection  40 mg Subcutaneous Q24H  . ferrous sulfate  325 mg Oral Q breakfast  . gabapentin  300 mg Oral BID  . lidocaine  1 patch Transdermal Q24H  . metoprolol succinate    12.5 mg Oral Daily  . mirtazapine  15 mg Oral QHS  . nicotine  14 mg Transdermal Daily  . pantoprazole  40 mg Oral Daily  . saccharomyces boulardii  250 mg Oral BID  . senna-docusate  1 tablet Oral BID  . sodium chloride flush  3 mL Intravenous Q12H  . vitamin B-12  100 mcg Oral Daily    SUBJECTIVE: Doing OK. Back pain is better but not resolved. Right arm/shoulder pain is more concerning currently for him. This had been worsening during inpatient stay over the last 4  weeks without any injury to explain.   No problems with antibiotics or PICC line.    Review of Systems: Review of Systems  Constitutional: Negative for chills, fever and malaise/fatigue.  HENT: Negative for tinnitus.   Eyes: Negative for blurred vision and photophobia.  Respiratory: Negative for cough and sputum production.   Cardiovascular: Negative for chest pain.  Gastrointestinal: Negative for diarrhea, nausea and vomiting.  Genitourinary: Negative for dysuria.  Musculoskeletal: Positive for back pain and joint pain.  Skin: Negative for rash.  Neurological: Negative for headaches.    Allergies  Allergen Reactions  . Chlorhexidine Itching and Rash    OBJECTIVE: Vitals:   02/04/20 1700 02/04/20 2018 02/05/20 0501 02/05/20 0934  BP: (!) 138/98 (!) 141/98 (!) 135/101 116/74  Pulse: 94 95 93 (!) 107  Resp: _0 Temp: 98.3 F (36.8 C) 98.1 F (36.7 C) 97.9 F (36.6 C) 97.9 F (36.6 C)  TempSrc: Oral Oral Oral   SpO2: 100% 100% 100% 99%  Weight:      Height:       Body mass index is 29.46 kg/m.  Physical Exam HENT:     Mouth/Throat:     Mouth: No oral lesions.     Dentition: Normal dentition. No dental caries.  Eyes:     General: No scleral icterus. Cardiovascular:     Rate and Rhythm: Normal rate and regular rhythm.     Heart sounds: Normal heart sounds.  Pulmonary:     Effort: Pulmonary effort is normal.     Breath sounds: Normal breath sounds.  Abdominal:     General: There is no distension.     Palpations: Abdomen is soft.     Tenderness: There is no abdominal tenderness.  Lymphadenopathy:     Cervical: No cervical adenopathy.  Skin:    General: Skin is warm and dry.     Findings: No rash.  Neurological:     Mental Status: He is alert and oriented to person, place, and time.     Lab Results Lab Results  Component Value Date   WBC 6.4 02/01/2020   HGB 10.9 (L) 02/01/2020   HCT 34.1 (L) 02/01/2020   MCV 85.0 02/01/2020   PLT 256  02/01/2020    Lab Results  Component Value Date   CREATININE 0.95 02/04/2020   BUN 9 02/04/2020   NA 136 02/04/2020   K 3.8 02/04/2020   CL 103 02/04/2020   CO2 25 02/04/2020    Lab Results  Component Value Date   ALT 13 01/22/2020   AST 24 01/22/2020   ALKPHOS 147 (H) 01/22/2020   BILITOT 0.8 01/22/2020     Microbiology: Recent Results (from the past 240 hour(s))  Aerobic/Anaerobic Culture (surgical/deep wound)     Status: None (Preliminary result)   Collection Time: 02/04/20 11:21 AM   Specimen: PATH Cytology Misc. fluid; Body Fluid  Result Value Ref Range  Status   Specimen Description SYNOVIAL FLUID  Final   Special Requests RIGHT SHOULDER  Final   Gram Stain   Final    RARE WBC PRESENT, PREDOMINANTLY MONONUCLEAR NO ORGANISMS SEEN    Culture   Final    NO GROWTH 1 DAY Performed at [facility], [place] [address], [place], [place] [address]    Report Status PENDING  Incomplete     Janene Madeira, MSN, NP-C [facility] [email or web address] Pager: [phone] Office: [phone] [place]: [phone]

## 2020-02-06 DIAGNOSIS — M4626 Osteomyelitis of vertebra, lumbar region: Secondary | ICD-10-CM | POA: Diagnosis not present

## 2020-02-06 LAB — CBC
HCT: 37.1 % — ABNORMAL LOW (ref 39.0–52.0)
Hemoglobin: 11.6 g/dL — ABNORMAL LOW (ref 13.0–17.0)
MCH: 27.2 pg (ref 26.0–34.0)
MCHC: 31.3 g/dL (ref 30.0–36.0)
MCV: 86.9 fL (ref 80.0–100.0)
Platelets: 264 10*3/uL (ref 150–400)
RBC: 4.27 MIL/uL (ref 4.22–5.81)
RDW: 15.9 % — ABNORMAL HIGH (ref 11.5–15.5)
WBC: 8.9 10*3/uL (ref 4.0–10.5)
nRBC: 0 % (ref 0.0–0.2)

## 2020-02-06 LAB — BASIC METABOLIC PANEL
Anion gap: 9 (ref 5–15)
BUN: 9 mg/dL (ref 6–20)
CO2: 24 mmol/L (ref 22–32)
Calcium: 9.7 mg/dL (ref 8.9–10.3)
Chloride: 104 mmol/L (ref 98–111)
Creatinine, Ser: 1.13 mg/dL (ref 0.61–1.24)
GFR, Estimated: >60 mL/min (ref >60)
Glucose, Bld: 111 mg/dL — ABNORMAL HIGH (ref 70–99)
Potassium: 4.2 mmol/L (ref 3.5–5.1)
Sodium: 137 mmol/L (ref 135–145)

## 2020-02-06 LAB — SEDIMENTATION RATE: Sed Rate: 8 mm/hr (ref 0–16)

## 2020-02-06 LAB — C-REACTIVE PROTEIN: CRP: 0.6 mg/dL (ref <1.0)

## 2020-02-06 LAB — VITAMIN D 25 HYDROXY (VIT D DEFICIENCY, FRACTURES): Vit D, 25-Hydroxy: 22.1 ng/mL — ABNORMAL LOW (ref 30–100)

## 2020-02-06 LAB — PATHOLOGIST SMEAR REVIEW

## 2020-02-06 MED ORDER — VITAMIN D (ERGOCALCIFEROL) 1.25 MG (50000 UNIT) PO CAPS
50000.0000 [IU] | ORAL_CAPSULE | ORAL | Status: DC
  Administered 2020-02-06 – 2020-02-13 (×2): 50000 [IU] via ORAL
  Filled 2020-02-06 (×3): qty 1

## 2020-02-06 MED ORDER — SODIUM CHLORIDE 0.9% FLUSH: 10.0000 mL | INTRAVENOUS | Status: DC | PRN

## 2020-02-06 MED ORDER — SODIUM CHLORIDE 0.9% FLUSH
10.0000 mL | Freq: Two times a day (BID) | INTRAVENOUS | Status: DC
  Administered 2020-02-07 – 2020-02-14 (×4): 10 mL
  Administered 2020-02-17 – 2020-02-19 (×2): 3 mL
  Administered 2020-02-20: 10 mL

## 2020-02-06 NOTE — Progress Notes (Signed)
Physical Therapy Treatment and Discharge Patient Details Name: [name] MRN: [id number] DOB: 05-07-1965 Today's Date: 02/06/2020    History of Present Illness 54 y/o male with PMH of discitis April 2021, s/p laminectormy, schizophrenia, cocaine use, presenting with mechanical fall in the shower with shoulder and back pain. Imaging reveals: progresive discitits osteomyelitis of LS spine, Septic arthriits at L3-4, spinal stenosis L1-2 and improved discitis at L1-2.  Underwent apsiration/core biopsy 10/11, neurosurgery recommends medical tx. Acute non displaced Fx of anterolateral L 9th rib; R shoulder with no acute findings.     PT Comments    Pt was seen for mobility today, reviewing gait and observing for changes related to his LLE.  Pt is having some ongoing back pain, but also hindered by his L ankle sensory changes and weakness from back surgery in the past.  Recent hardware removal as well from previous L ankle fracture.  With the stiffness from ankle injury, he has been less likely to catch toes on L foot and trip, but does report issues with this.  As a result, will recommend he get an order to follow up at discharge with an orthotist to manage the concurrent issues of L ankle and foot.  Pt is at risk for both falls and injury to the L foot from further instability, but is not going to be remedied by further PT.  Discharge therapy for now.   Follow Up Recommendations  No PT follow up;Other (comment) (recommend orthotic consult to determine best support L ankle)     Equipment Recommendations  None recommended by PT    Recommendations for Other Services       Precautions / Restrictions Precautions Precautions: Back Precaution Booklet Issued: No Restrictions Weight Bearing Restrictions: No    Mobility  Bed Mobility Overal bed mobility: Independent                Transfers Overall transfer level: Modified independent Equipment used: None                 Ambulation/Gait Ambulation/Gait assistance: Modified independent (Device/Increase time) Gait Distance (Feet): 200 Feet Assistive device: None Gait Pattern/deviations: Step-through pattern;Wide base of support;Trunk flexed (mild trunk flexion) Gait velocity: decreased   General Gait Details: pt is walking wiht no support on UE's but has access to a walker for gait support in his room   Stairs             Wheelchair Mobility    Modified Rankin (Stroke Patients Only)       Balance Overall balance assessment: Needs assistance Sitting-balance support: Feet supported Sitting balance-[name]: Good     Standing balance support: No upper extremity supported Standing balance-[name]: Fair                              Cognition Arousal/Alertness: Awake/alert Behavior During Therapy: WFL for tasks assessed/performed Overall Cognitive Status: Within Functional Limits for tasks assessed                                 General Comments: discussion with PT about his L ankle residual weakness and sensory loss      Exercises      General Comments General comments (skin integrity, edema, etc.): pt has no observed losses of balance but does modify his gait to widen base and has feet in ER, somewhat  limited by L foot drop with greater hip flex to clear      Pertinent Vitals/Pain Pain Assessment: Faces Faces Pain Scale: Hurts a little bit Pain Location: back Pain Descriptors / Indicators: Guarding Pain Intervention(s): Monitored during session    Home Living                      Prior Function            PT Goals (current goals can now be found in the care plan section) Acute Rehab PT Goals Patient Stated Goal: to return home PT Goal Formulation: All assessment and education complete, DC therapy Progress towards PT goals: Goals met/education completed, patient discharged from PT    Frequency    Min 1X/week      PT  Plan Discharge plan needs to be updated    Co-evaluation              AM-PAC PT "6 Clicks" Mobility   Outcome Measure  Help needed turning from your back to your side while in a flat bed without using bedrails?: None Help needed moving from lying on your back to sitting on the side of a flat bed without using bedrails?: None Help needed moving to and from a bed to a chair (including a wheelchair)?: None Help needed standing up from a chair using your arms (e.g., wheelchair or bedside chair)?: None Help needed to walk in hospital room?: None Help needed climbing 3-5 steps with a railing? : A Little 6 Click Score: 23    End of Session Equipment Utilized During Treatment: Gait belt Activity Tolerance: Patient tolerated treatment well;Treatment limited secondary to medical complications (Comment) (L ankle is weak, stiff and has poor sensation over foot) Patient left: in bed;with call bell/phone within reach Nurse Communication: Mobility status PT Visit Diagnosis: Unsteadiness on feet (R26.81)     Time: 1636-1700 PT Time Calculation (min) (ACUTE ONLY): 24 min  Charges:  $Gait Training: 8-22 mins $Therapeutic Exercise: 8-22 mins                Ivar Drape 02/06/2020, 5:18 PM  Samul Dada, PT MS Acute Rehab Dept. Number: [facility] [id number] and [facility] [phone]

## 2020-02-06 NOTE — Progress Notes (Signed)
PROGRESS NOTE    [name]  OZH:[id number] DOB: 02-Dec-1965 DOA: 01/06/2020 PCP: [name], MD   Brief Narrative: 54 year old with past medical history significant for discitis (April 2021 treated with 6 weeks of vancomycin and ceftriaxone, cultures unrevealing), status post laminectomy, history of schizophrenia and ongoing cocaine use who presented to the ED complaining of shoulder pain and back pain after a mechanical fall 4 days prior to admission.  -Evaluation in the ED imaging noted isolated rib fracture, LS spine show possible progressive discitis osteomyelitis.  MRI shows septic arthritis at L3-4, associated epidural phlegmon and enhancement extending from T12-L1.  Resultant moderate to severe diffuse spinal stenosis extending from L1-L2.  Improved discitis at L1-2. Patient was evaluated by neurosurgery, who recommended medical treatment and IR guided disc space biopsy to guide  antibiotics.  Attempt at aspiration unsuccessful, now on broad-spectrum antibiotics for 6 weeks. -Remain inpatient to complete IV antibiotic therapy.  Reporter worsening shoulder pain. Underwent MRI (11/07)  which showed large glenohumeral joint effusion, rotator cuff tear, and possible earlier finding of osteomyelitis humerus. Evaluated By Dr Otelia Sergeant, patient effusion likely related to chronic inflammation, AVN of humerus.     Assessment & Plan:   Principal Problem:   Acute osteomyelitis of lumbar spine (HCC) Active Problems:   Cocaine abuse (HCC)   Benign essential HTN   History of CVA (cerebrovascular accident)   Chronic pain syndrome   Closed fracture of one rib of left side   Discitis of lumbar region   Ankle ankylosis, left    1-Recurrent discitis/osteomyelitis of L3-4, associated epidural phlegmon: -Previous history of discitis completed 6 weeks course of IV ceftriaxone and vancomycin early June inpatient. -Failed attempt at disc aspiration due to severe disc space narrowing. Had core  Biopsy 10-11: disc L1 fluid; no growth.  -Patient was initially treated with IV ceftriaxone and vancomycin, subsequently ceftriaxone was changed to cefepime on 10/14.  Continue with vancomycin. -Continue with pain management, Flexeril pain, morphine immediate release -Continue with IV vancomycin and cefepime to complete 6 weeks of antibiotics course on 11/21st -WBC normal, CRP 0.7 from 2.1  -Continue with current management.  -He report back pain has been improving. Denies worsening pain.  -Appreciate ID recommendation. Patient will need MRI lumbar spine prior to discharge (week 5-6 of treatment. )  2-Large glenohumeral joint effusion, suspicious of early osteomyelitis of the proximal humerus, rotator cuff tear: -Discussed MRI results with Dr Ophelia Charter. He recommend IR arthrocentesis.  -Continue with IV antibiotics.  -Arthrocentesis 11/07: Synovial fluids; WBC 570, Neutrophils 35. Culture: No growth.  -Joint effusion likely chronic inflamation post trauma. Humerus changes on MRI probably AVN. DR Otelia Sergeant is recommending surgery in 1 year.   3-History of ongoing cocaine, prior ETOH use: -Counseled.   4-COPD: Continue with as needed nebulizer. Nicotine patch.  5-Schizophrenia: On Invega every 3 months, continue with Cogentin, Wellbutrin and Remeron. Invega next dose due 03/10/2020. He last dose of invega was on September 9.   6-Mild hyponatremia: Resolved.  7-Anemia of chronic disease: Stable. Low B12, on supplement.   8-Acute nondisplaced fracture, left ninth rib: Pain management,  incentive spirometry.  9-GERD: Continue with PPI.  10-Hypertension: Continue with Toprol. 11-Low Vitamin D; Started on Vitamin D 50,000 units weekly.   Estimated body mass index is 29.2 kg/m as calculated from the following:   Height as of this encounter: 5\' 9"  (1.753 m).   Weight as of this encounter: 89.7 kg.   DVT prophylaxis: Lovenox Code Status: Full code Family Communication:  Care discussed with  patient. Fiance 11/07---11/09 Disposition Plan:  Status is: Inpatient  Remains inpatient appropriate because:IV treatments appropriate due to intensity of illness or inability to take PO   Dispo: The patient is from: Home              Anticipated d/c is to: Home              Anticipated d/c date is: > 3 days              Patient currently is not medically stable to d/c. Needs to complete IV antibiotics for recurrent Discitis.         Consultants:   Neurosurgery  ID  IR  Procedures:   Unsuccessful disc aspiration lumbar spine  Antimicrobials:  Vancomycin and cefepime  Subjective: Complaints of shoulder pain. Had back pain last night. Couldn't  sleep  Objective: Vitals:   02/05/20 1735 02/05/20 2134 02/06/20 0444 02/06/20 0932  BP: (!) 140/99 (!) 143/97 124/86 115/83  Pulse: (!) 107 96 94 (!) 106  Resp: 18  16 18   Temp: 98.1 F (36.7 C) 98.3 F (36.8 C) 98 F (36.7 C) 98 F (36.7 C)  TempSrc:  Oral Oral Oral  SpO2: 100% 99% 97% 95%  Weight: 89.7 kg     Height:        Intake/Output Summary (Last 24 hours) at 02/06/2020 1416 Last data filed at 02/06/2020 1230 Gross per 24 hour  Intake 2000 ml  Output 400 ml  Net 1600 ml   Filed Weights   02/02/20 2015 02/03/20 2040 02/05/20 1735  Weight: 91.1 kg 90.5 kg 89.7 kg    Examination:  General exam: NAD Respiratory system: CTA Cardiovascular system: S 1, S 2 RRR Gastrointestinal system: BS present, soft, nt Central nervous system: ALert Extremities: No edema   Data Reviewed: I have personally reviewed following labs and imaging studies  CBC: Recent Labs  Lab 02/01/20 0210 02/06/20 0243  WBC 6.4 8.9  HGB 10.9* 11.6*  HCT 34.1* 37.1*  MCV 85.0 86.9  PLT 256 264   Basic Metabolic Panel: Recent Labs  Lab 02/01/20 1622 02/04/20 0438 02/06/20 0243  NA 139 136 137  K 4.2 3.8 4.2  CL 101 103 104  CO2 26 25 24   GLUCOSE 91 113* 111*  BUN 11 9 9   CREATININE 1.04 0.95 1.13  CALCIUM 9.5 9.0  9.7   GFR: Estimated Creatinine Clearance: 82.8 mL/min (by C-G formula based on SCr of 1.13 mg/dL). Liver Function Tests: No results for input(s): AST, ALT, ALKPHOS, BILITOT, PROT, ALBUMIN in the last 168 hours. No results for input(s): LIPASE, AMYLASE in the last 168 hours. No results for input(s): AMMONIA in the last 168 hours. Coagulation Profile: Recent Labs  Lab 02/04/20 1831  INR 0.9   Cardiac Enzymes: No results for input(s): CKTOTAL, CKMB, CKMBINDEX, TROPONINI in the last 168 hours. BNP (last 3 results) No results for input(s): PROBNP in the last 8760 hours. HbA1C: No results for input(s): HGBA1C in the last 72 hours. CBG: No results for input(s): GLUCAP in the last 168 hours. Lipid Profile: No results for input(s): CHOL, HDL, LDLCALC, TRIG, CHOLHDL, LDLDIRECT in the last 72 hours. Thyroid Function Tests: No results for input(s): TSH, T4TOTAL, FREET4, T3FREE, THYROIDAB in the last 72 hours. Anemia Panel: No results for input(s): VITAMINB12, FOLATE, FERRITIN, TIBC, IRON, RETICCTPCT in the last 72 hours. Sepsis Labs: No results for input(s): PROCALCITON, LATICACIDVEN in the last 168 hours.  Recent Results (from the  past 240 hour(s))  Aerobic/Anaerobic Culture (surgical/deep wound)     Status: None (Preliminary result)   Collection Time: 02/04/20 11:21 AM   Specimen: PATH Cytology Misc. fluid; Body Fluid  Result Value Ref Range Status   Specimen Description SYNOVIAL FLUID  Final   Special Requests RIGHT SHOULDER  Final   Gram Stain   Final    RARE WBC PRESENT, PREDOMINANTLY MONONUCLEAR NO ORGANISMS SEEN    Culture   Final    NO GROWTH 2 DAYS NO ANAEROBES ISOLATED; CULTURE IN PROGRESS FOR 5 DAYS Performed at [facility], [address], [place], [place] [address]    Report Status PENDING  Incomplete         Radiology Studies: No results found.      Scheduled Meds: . benztropine  1 mg Oral Daily  . buPROPion  150 mg Oral Daily  . diclofenac  Sodium  2 g Topical QID  . enoxaparin (LOVENOX) injection  40 mg Subcutaneous Q24H  . ferrous sulfate  325 mg Oral Q breakfast  . gabapentin  300 mg Oral BID  . lidocaine  1 patch Transdermal Q24H  . metoprolol succinate  12.5 mg Oral Daily  . mirtazapine  15 mg Oral QHS  . nicotine  14 mg Transdermal Daily  . pantoprazole  40 mg Oral Daily  . saccharomyces boulardii  250 mg Oral BID  . senna-docusate  1 tablet Oral BID  . sodium chloride flush  3 mL Intravenous Q12H  . vitamin B-12  100 mcg Oral Daily  . Vitamin D (Ergocalciferol)  50,000 Units Oral Q7 days   Continuous Infusions: . sodium chloride 250 mL (01/30/20 1407)  . ceFEPime (MAXIPIME) IV 2 g (02/06/20 1359)  . vancomycin 1,000 mg (02/06/20 0418)     LOS: 30 days    Time spent: 35 minutes.     Alba Cory, MD [facility]   If 7PM-7AM, please contact night-coverage [email or web address]  02/06/2020, 2:16 PM

## 2020-02-06 NOTE — Plan of Care (Signed)
  Problem: Health Behavior/Discharge Planning: Goal: Ability to manage health-related needs will improve Outcome: Progressing   

## 2020-02-07 DIAGNOSIS — S2232XA Fracture of one rib, left side, initial encounter for closed fracture: Secondary | ICD-10-CM | POA: Diagnosis not present

## 2020-02-07 DIAGNOSIS — M4626 Osteomyelitis of vertebra, lumbar region: Secondary | ICD-10-CM | POA: Diagnosis not present

## 2020-02-07 LAB — BASIC METABOLIC PANEL
Anion gap: 9 (ref 5–15)
BUN: 10 mg/dL (ref 6–20)
CO2: 24 mmol/L (ref 22–32)
Calcium: 9.2 mg/dL (ref 8.9–10.3)
Chloride: 104 mmol/L (ref 98–111)
Creatinine, Ser: 1.15 mg/dL (ref 0.61–1.24)
GFR, Estimated: >60 mL/min (ref >60)
Glucose, Bld: 130 mg/dL — ABNORMAL HIGH (ref 70–99)
Potassium: 3.7 mmol/L (ref 3.5–5.1)
Sodium: 137 mmol/L (ref 135–145)

## 2020-02-07 LAB — VANCOMYCIN, TROUGH: Vancomycin Tr: 18 ug/mL (ref 15–20)

## 2020-02-07 NOTE — Plan of Care (Signed)
  Problem: Health Behavior/Discharge Planning: Goal: Ability to manage health-related needs will improve Outcome: Progressing   Problem: Clinical Measurements: Goal: Will remain free from infection Outcome: Progressing   Problem: Pain Managment: Goal: General experience of comfort will improve Outcome: Progressing   

## 2020-02-07 NOTE — Progress Notes (Signed)
PROGRESS NOTE  [name] EVO:[id number] DOB: 31-Aug-1965 DOA: 01/06/2020 PCP: [name], MD  Brief History   54 year old man PMH discitis April 2021 status post laminectomy, history of schizophrenia, cocaine use presented with shoulder and back pain after mechanical fall.  Found to have isolated rib fracture, lumbar progressive discitis and osteomyelitis with septic arthritis at L3-4 and associated epidural phlegmon.  Patient evaluated by neurosurgery with recommendation for medical treatment.  Seen by infectious disease.  Continues on 6 weeks of IV antibiotics.  Also developed worsening shoulder pain, seen by orthopedics with conclusion chronic inflammation and recommendation for surgery after a year infection free.   A & P  Recurrent discitis, osteomyelitis L3-4 with associated epidural phlegmon --Continue 6 weeks antibiotics, finishes November 21. --Continue pain management --Repeat MRI lumbar spine next week as per ID  Large glenohumeral joint effusion thought to be posttraumatic --Per orthopedics not a candidate for surgery.  PMH cocaine use, alcohol use --Counseled earlier in hospitalization  COPD --Stable  Schizophrenia --On Invega every 3 months.  Continue Cogentin, Wellbutrin and Remeron.  Acute nondisplaced fracture left ninth rib --Supportive care  Disposition Plan:  Discussion: plan as above  Status is: Inpatient  Remains inpatient appropriate because:IV treatments appropriate due to intensity of illness or inability to take PO  Dispo: The patient is from: Home              Anticipated d/c is to: Home              Anticipated d/c date is: > 3 days              Patient currently is not medically stable to d/c.  DVT prophylaxis: enoxaparin (LOVENOX) injection 40 mg Start: 01/16/20 1300 SCDs Start: 01/07/20 1025   Code Status: Full Code Family Communication: none present  Brendia Sacks, MD  [facility] Direct contact: see [email or web address] (further  directions at bottom of note if needed) 7PM-7AM contact night coverage as at bottom of note 02/07/2020, 6:09 PM  LOS: 31 days   Significant Hospital Events   .    Consults:  . ID . Neurosurgery . Orthopedics . IR   Procedures:   Unsuccessful disc aspiration lumbar spine  Significant Diagnostic Tests:  Marland Kitchen    Micro Data:  .    Antimicrobials:  .   Interval History/Subjective  CC: f/u back pain  Overall doing ok, feeling ok, eating well  Objective   Vitals:  Vitals:   02/07/20 1215 02/07/20 1500  BP: (!) 132/98 105/69  Pulse: 81 (!) 104  Resp: 14 16  Temp:  98.9 F (37.2 C)  SpO2: 99% 97%    Exam:  Physical Exam Vitals reviewed.  Constitutional:      Appearance: Normal appearance.  Cardiovascular:     Rate and Rhythm: Normal rate and regular rhythm.     Heart sounds: No murmur heard.  No gallop.   Pulmonary:     Effort: Pulmonary effort is normal. No respiratory distress.     Breath sounds: Normal breath sounds. No wheezing or rales.  Neurological:     Mental Status: He is alert.  Psychiatric:        Mood and Affect: Mood normal.        Behavior: Behavior normal.      I have personally reviewed the following:   Today's Data  . BMP stable  Scheduled Meds: . benztropine  1 mg Oral Daily  . buPROPion  150 mg Oral Daily  .  diclofenac Sodium  2 g Topical QID  . enoxaparin (LOVENOX) injection  40 mg Subcutaneous Q24H  . ferrous sulfate  325 mg Oral Q breakfast  . gabapentin  300 mg Oral BID  . lidocaine  1 patch Transdermal Q24H  . metoprolol succinate  12.5 mg Oral Daily  . mirtazapine  15 mg Oral QHS  . nicotine  14 mg Transdermal Daily  . pantoprazole  40 mg Oral Daily  . saccharomyces boulardii  250 mg Oral BID  . senna-docusate  1 tablet Oral BID  . sodium chloride flush  10-40 mL Intracatheter Q12H  . sodium chloride flush  3 mL Intravenous Q12H  . vitamin B-12  100 mcg Oral Daily  . Vitamin D (Ergocalciferol)  50,000 Units Oral Q7  days   Continuous Infusions: . sodium chloride 250 mL (01/30/20 1407)  . ceFEPime (MAXIPIME) IV 2 g (02/07/20 1357)  . vancomycin 1,000 mg (02/07/20 1620)    Principal Problem:   Acute osteomyelitis of lumbar spine (HCC) Active Problems:   Cocaine abuse (HCC)   Benign essential HTN   History of CVA (cerebrovascular accident)   Chronic pain syndrome   Closed fracture of one rib of left side   Discitis of lumbar region   Ankle ankylosis, left   LOS: 31 days   How to contact the [facility] Attending or Consulting provider 7A - 7P or covering provider during after hours 7P -7A, for this patient?  1. Check the care team in [facility] and look for a) attending/consulting [facility] provider listed and b) the [facility] team listed 2. Log into [email or web address] and use [facility] [facility]'s universal password to access. If you do not have the password, please contact the hospital operator. 3. Locate the [facility] provider you are looking for under [facility] and page to a number that you can be directly reached. 4. If you still have difficulty reaching the provider, please page the [facility] (Director on Call) for the Hospitalists listed on amion for assistance.

## 2020-02-07 NOTE — Progress Notes (Signed)
Pharmacy Antibiotic Note  [name] is a 54 y.o. male admitted on 01/06/2020 with prior history of lumbar osteomyelitis/discitis in May-June 2021, now with back pain post fall at home and MRI showing lumbar osteomyelitis. Neurosurgery was unable to get successful disc aspiration during biopsy. Patient with history of L ankle Enterobacter infection, S/P hardware removal in 2019; ortho saw patient here, possible chronic vs advancing osteo. Pharmacy has been consulted for vancomycin and cefepime dosing.  Plan is for 6 wks of IV abx (vancomycin/cefepime, thru 02/18/20), then consolidate to PO doxycycline and cephalexin for another month.   Steady-state vancomycin trough level drawn today ~12 hrs of last dose of vancomycin 1 gm IV Q 12 hr regimen was 18 mg/L, which remains within the goal range for this pt. Scr trending up slowly, 1.15 today, CrCl 81.3 ml/min; will monitor renal function closely. WBC 8.9, afebrile; pt reports that back pain is improving.  Plan: Continue vancomycin 1 gm IV Q 12 hrs Continue cefepime 2 gm IV Q 8 hrs BMET Q 48 hrs while on vancomycin; next on 11/12; monitor renal function closely, Scr trending up slowly Vancomycin trough weekly, or as indicated (target trough level: 15-20 mg/L)  Height: 5\' 9"  (175.3 cm) Weight: 89.7 kg (197 lb 12 oz) IBW/kg (Calculated) : 70.7  Temp (24hrs), Avg:98.1 F (36.7 C), Min:97.5 F (36.4 C), Max:98.9 F (37.2 C)  Recent Labs  Lab 02/01/20 0210 02/01/20 1611 02/01/20 1622 02/04/20 0438 02/06/20 0243 02/07/20 0459 02/07/20 1513  WBC 6.4  --   --   --  8.9  --   --   CREATININE  --   --  1.04 0.95 1.13 1.15  --   VANCOTROUGH  --  17  --   --   --   --  18    Estimated Creatinine Clearance: 81.3 mL/min (by C-G formula based on SCr of 1.15 mg/dL).    Allergies  Allergen Reactions  . Chlorhexidine Itching and Rash    Antimicrobials this admission: Ceftriaxone 10/11 >> 10/14 Vancomycin 10/11 >> (11/21) Cefepime 10/14 >>  (11/21) Keflex/Doxy to begin 11/22 for a month >> (not entered yet)  Dose adjustments / vancomycin trough levels: 10/15 VT = 22 mcg/ml but dose prior PM dose was given ~4 hours late so true trough ~15 mcg/ml (therapeutic) on 1500 mg IV q12h> no change 10/21 VT = 23 mcg/ml , drawn ~50 min early, on 1500 mg IV q12h > decr to 1250 mg IV q12h 10/25 VT= 21 mcg/ml - on 1250 mg IV q12h > decr to 1gm IV q12h 10/28 VT = 17 mcg/ml - continue 1 gm IV q12h 11/4 VT = 17 mg/L - continue vancomycin 1 gm IV q12h 11/10 VT = 18 mg/L - continue vancomycin 1 gm IV Q 12 hrs  Microbiology results: 10/11 fluid, disc L1 cx: NG/final 10/11 Blood x 2: NG/final 10/10 COVID, flu: negative 11/7 R shoulder synovial fluid: NOS on Gram stain, cx NG at 3 days  Thank you for allowing pharmacy to be a part of this patient's care.  Gillermina Hu, PharmD, BCPS, [facility] Clinical Pharmacist 02/07/2020 4:10 PM

## 2020-02-07 NOTE — Progress Notes (Signed)
   02/07/20 1050  Assess: MEWS Score  Temp 98.4 F (36.9 C)  BP (!) 122/98  Resp 14  Level of Consciousness Alert  SpO2 98 %  O2 Device Room Air  Assess: MEWS Score  MEWS Temp 0  MEWS Systolic 0  MEWS Pulse 2  MEWS RR 0  MEWS LOC 0  MEWS Score 2  MEWS Score Color Yellow  Assess: if the MEWS score is Yellow or Red  Were vital signs taken at a resting state? Yes  Focused Assessment No change from prior assessment  Early Detection of Sepsis Score *See Row Information* Low  MEWS guidelines implemented *See Row Information* Yes  Treat  Pain Scale 0-10  Pain Score 9  Pain Type Acute pain  Pain Location Back  Pain Orientation Lower  Pain Intervention(s) Medication (See eMAR)  Take Vital Signs  Increase Vital Sign Frequency  Yellow: Q 2hr X 2 then Q 4hr X 2, if remains yellow, continue Q 4hrs  Escalate  MEWS: Escalate Yellow: discuss with charge nurse/RN and consider discussing with provider and RRT  Notify: Charge Nurse/RN  Name of Charge Nurse/RN Notified  (discussed with Laverda Sorenson and Ren, RN )  Date Charge Nurse/RN Notified 02/07/20  Time Charge Nurse/RN Notified 1050  Notify: Provider  Provider Name/Title N/A  Notify: Rapid Response  Name of Rapid Response RN Notified NA  Document  Patient Outcome Other (Comment) (Continue to monitor)  Progress note created (see row info) Yes

## 2020-02-07 NOTE — Hospital Course (Addendum)
54 year old man PMH discitis April 2021 status post laminectomy, history of schizophrenia, cocaine use presented with shoulder and back pain after mechanical fall.  Found to have isolated rib fracture, lumbar progressive discitis and osteomyelitis with septic arthritis at L3-4 and associated epidural phlegmon.  Patient evaluated by neurosurgery with recommendation for medical treatment.  Seen by infectious disease.  Continues on 6 weeks of IV antibiotics.  Also developed worsening shoulder pain, seen by orthopedics with conclusion chronic inflammation and recommendation for surgery after a year infection free.

## 2020-02-07 NOTE — Progress Notes (Signed)
Patient ID: [name], male   DOB: 1966/01/03, 54 y.o.   MRN: [id number]     Subjective:   Right shoulder hurts, no fever or chills, weak with lifting right shoulder. Swelling right shoulder persists.    Patient reports pain as moderate.    Objective:   VITALS:  Temp:  [97.5 F (36.4 C)-98.9 F (37.2 C)] 98.9 F (37.2 C) (11/10 1500) Pulse Rate:  [81-116] 104 (11/10 1500) Resp:  [14-20] 16 (11/10 1500) BP: (105-143)/(69-100) 105/69 (11/10 1500) SpO2:  [97 %-100 %] 97 % (11/10 1500)  Neurologically intact ABD soft Neurovascular intact Sensation intact distally Intact pulses distally Dorsiflexion/Plantar flexion intact Right arm active abduction and external rotation is limited. He is not toxic or showing signs of malaise or sepsis.    LABS Recent Labs    02/06/20 0243  HGB 11.6*  WBC 8.9  PLT 264   Recent Labs    02/06/20 0243 02/07/20 0459  NA 137 137  K 4.2 3.7  CL 104 104  CO2 24 24  BUN 9 10  CREATININE 1.13 1.15  GLUCOSE 111* 130*   Recent Labs    02/04/20 1831  INR 0.9   Sed rate 8 CRP 0.6 Aspirate right shoulder with 500 WBC per cc, predominantly chronic inflamatory lymphocytes and megakaryocytes Plain radiographs with moderatelysevere osteoarthritis right glenohumeral joint. MRI with large effusion right shoulder severe tendonosis, there is a wedge shape area of edema in the inferomedial humeral head consistent with AVN avascular necrosis.  Assessment/Plan:   Inflamatory effusion right shoulder with avascular necrosis and severe osteoarthritis. Not a candidate for surgical joint replacement.  Sed rate and CRP suggest that this is not an infection.    Advance diet Up with therapyOccupational therapy to work on right shoulder and use iontophoresis. ROM, stretching and strengthening program. May wish to try low dose of toradol as his kidney function is normal, would be  At risk of gastritis, PUD previous H. Pylori testing positive  07/2008 Requests flu vaccination. Will place order for OT.  Basil Dess 02/07/2020, 5:35 PM

## 2020-02-08 NOTE — Plan of Care (Signed)
  Problem: Health Behavior/Discharge Planning: Goal: Ability to manage health-related needs will improve Outcome: Progressing   Problem: Clinical Measurements: Goal: Will remain free from infection Outcome: Progressing   Problem: Pain Managment: Goal: General experience of comfort will improve Outcome: Progressing   

## 2020-02-08 NOTE — Progress Notes (Signed)
PROGRESS NOTE  [name] EXB:[id number] DOB: 1965-04-25 DOA: 01/06/2020 PCP: [name], MD  Brief History   54 year old man PMH discitis April 2021 status post laminectomy, history of schizophrenia, cocaine use presented with shoulder and back pain after mechanical fall.  Found to have isolated rib fracture, lumbar progressive discitis and osteomyelitis with septic arthritis at L3-4 and associated epidural phlegmon.  Patient evaluated by neurosurgery with recommendation for medical treatment.  Seen by infectious disease.  Continues on 6 weeks of IV antibiotics.  Also developed worsening shoulder pain, seen by orthopedics with conclusion chronic inflammation and recommendation for surgery after a year infection free.   A & P  Recurrent discitis, osteomyelitis L3-4 with associated epidural phlegmon --Continue 6 weeks antibiotics, finishes November 21. --Continue pain management --Repeat MRI lumbar spine next week as per ID and then reconsult ID for further recs  Large glenohumeral joint effusion thought to be posttraumatic --follow-up w/ orthopedics as an outpatient; no surgery before 1 year post-infeciton.  PMH cocaine use, alcohol use --Counseled earlier in hospitalization  COPD --remains stable  Schizophrenia --On Invega every 3 months.  Continue Cogentin, Wellbutrin and Remeron.  Acute nondisplaced fracture left ninth rib --Supportive care  Disposition Plan:  Discussion: plan as above  Status is: Inpatient  Remains inpatient appropriate because:IV treatments appropriate due to intensity of illness or inability to take PO  Dispo: The patient is from: Home              Anticipated d/c is to: Home              Anticipated d/c date is: > 3 days              Patient currently is not medically stable to d/c.  DVT prophylaxis: enoxaparin (LOVENOX) injection 40 mg Start: 01/16/20 1300 SCDs Start: 01/07/20 1025   Code Status: Full Code Family Communication: none  present  Brendia Sacks, MD  [facility] Direct contact: see [email or web address] (further directions at bottom of note if needed) 7PM-7AM contact night coverage as at bottom of note 02/08/2020, 5:02 PM  LOS: 32 days   Significant Hospital Events   .    Consults:  . ID . Neurosurgery . Orthopedics . IR   Procedures:   Unsuccessful disc aspiration lumbar spine  Significant Diagnostic Tests:  Marland Kitchen    Micro Data:  .    Antimicrobials:  .   Interval History/Subjective  CC: f/u back pain  Right shoulder pain, otherwise feeling ok  Objective   Vitals:  Vitals:   02/08/20 0400 02/08/20 0904  BP: 120/87 104/77  Pulse: 87 (!) 103  Resp: 15 19  Temp: 98.7 F (37.1 C) 97.8 F (36.6 C)  SpO2: 96% 98%    Exam: Constitutional:   . Appears calm and comfortable ENMT:  . grossly normal hearing  Respiratory:  . CTA bilaterally, no w/r/r.  . Respiratory effort normal.  Cardiovascular:  . RRR, no m/r/g Psychiatric:  . Mental status o Mood, affect appropriate   I have personally reviewed the following:   Today's Data  . No new data  Scheduled Meds: . benztropine  1 mg Oral Daily  . buPROPion  150 mg Oral Daily  . diclofenac Sodium  2 g Topical QID  . enoxaparin (LOVENOX) injection  40 mg Subcutaneous Q24H  . ferrous sulfate  325 mg Oral Q breakfast  . gabapentin  300 mg Oral BID  . lidocaine  1 patch Transdermal Q24H  . metoprolol succinate  12.5 mg Oral Daily  . mirtazapine  15 mg Oral QHS  . nicotine  14 mg Transdermal Daily  . pantoprazole  40 mg Oral Daily  . saccharomyces boulardii  250 mg Oral BID  . senna-docusate  1 tablet Oral BID  . sodium chloride flush  10-40 mL Intracatheter Q12H  . sodium chloride flush  3 mL Intravenous Q12H  . vitamin B-12  100 mcg Oral Daily  . Vitamin D (Ergocalciferol)  50,000 Units Oral Q7 days   Continuous Infusions: . sodium chloride 250 mL (01/30/20 1407)  . ceFEPime (MAXIPIME) IV Stopped (02/08/20 1510)  .  vancomycin 1,000 mg (02/08/20 1615)    Principal Problem:   Acute osteomyelitis of lumbar spine (HCC) Active Problems:   Cocaine abuse (HCC)   Benign essential HTN   History of CVA (cerebrovascular accident)   Chronic pain syndrome   Closed fracture of one rib of left side   Discitis of lumbar region   Ankle ankylosis, left   LOS: 32 days   How to contact the [facility] Attending or Consulting provider 7A - 7P or covering provider during after hours 7P -7A, for this patient?  1. Check the care team in [facility] and look for a) attending/consulting TRH provider listed and b) the [facility] team listed 2. Log into [email or web address] and use [place]'s universal password to access. If you do not have the password, please contact the hospital operator. 3. Locate the [facility] provider you are looking for under [facility] and page to a number that you can be directly reached. 4. If you still have difficulty reaching the provider, please page the [facility] (Director on Call) for the Hospitalists listed on amion for assistance.

## 2020-02-08 NOTE — Progress Notes (Signed)
[facility] Infectious Diseases Follow Up Note  Patient Identification: Patient Name: [name] MRN: [id number] Admit Date: 01/06/2020  8:37 PM Age: 54 y.o.Today's Date: 02/08/2020   Reason for Visit: Follow up on discitis/OM and epidural phlegmon   Principal Problem:   Acute osteomyelitis of lumbar spine ([place]) Active Problems:   Cocaine abuse (HCC)   Benign essential HTN   History of CVA (cerebrovascular accident)   Chronic pain syndrome   Closed fracture of one rib of left side   Discitis of lumbar region   Ankle ankylosis, left   Antibiotics: Total days of antibiotics Day 28   Assessment RECURRENT Discitis/Osteomyelitis and epidural phlegmon and abscess - no surgical intervention done. Patient is s/p 6 weeks of empiric antibiotics with vancomycin and ceftriaxone in May-June 2021.  Rt shoulder Effusion with no growth in Cx on day 3 . SF not suggestive of Septic arthritis RT shoulder AVN and Severe OA - Management per Ortho   Rt knee Arthroplasty in 2018   Medication monitoring - Cr has been stable, vanc trough 18  HepC ab positive with negative HCV RNA - no active infection, does not need Tx.   Recommendations Continue Vancomycin and Cefepime as is. Will need to treat for at least 6 weeks or even longer given recurrence of infection in a supervised setting given h/o cocaine use. Please get an MRI spine when he is approaching the end of 6 weeks (42 days)  to monitor the resolution of thoracolumbar abscess/phlegmon. Today is day 28 of therapy.  It seems patient is planned to stay inpatient given schizophrenia, cocaine use. Please call us back when repeat MRI spine is done and will determine further need of continuation of abx depending on resolution of the epidural abscess  Monitor CBC , BMP and Vanc trough while on IV abx  Weekly ESR and CRP  Will sign off for now.   Rest of the management as per the primary  team. Thank you for the consult. Please page with pertinent questions or concerns.  Rosiland Oz, MD Infectious [place]   To contact the attending provider between 8A-5P or the covering provider during after hours 5P-8A, please log into the web site [email or web address] and access using universal [place] password for that web site. If you do not have the password, please call the hospital operator. ______________________________________________________________________ Subjective patient seen and examined at the bedside. Denies any complains. Back pain has been stable. No issues with the IV abx. Overall clinically stable    Vitals BP 104/77 (BP Location: Right Arm)   Pulse (!) 103   Temp 97.8 F (36.6 C)   Resp 19   Ht $R'5\' 9"'DW$  (1.753 m)   Wt 92.1 kg   SpO2 98%   BMI 29.98 kg/m   Physical Exam Constitutional:  NAD    Comments:   Cardiovascular:     Rate and Rhythm: Normal rate and regular rhythm.     Heart sounds:   Pulmonary:     Effort: Pulmonary effort is normal.     Comments: clear air netry bilaterally  Abdominal:     Palpations: Abdomen is soft.     Tenderness: Non tender   Musculoskeletal:        General: No swelling or tenderness. BACK HAS MINIMAL TEDNERNESS AT THE LOWER LUMBAR REGION  Skin:    Comments: No obvious rashes, lesions   Neurological:     General: No focal deficit present. NO CHANGES IN  NEUROLOGICAL EXAM   Psychiatric:        Mood and Affect: Mood normal.   Pertinent Microbiology Results for orders placed or performed during the hospital encounter of 01/06/20  Respiratory Panel by RT PCR (Flu A&B, Covid) - Nasopharyngeal Swab     Status: None   Collection Time: 01/07/20  3:09 AM   Specimen: Nasopharyngeal Swab  Result Value Ref Range Status   SARS Coronavirus 2 by RT PCR NEGATIVE NEGATIVE Final    Comment: (NOTE) SARS-CoV-2 target nucleic acids are NOT DETECTED.  The SARS-CoV-2 RNA is generally  detectable in upper respiratoy specimens during the acute phase of infection. The lowest concentration of SARS-CoV-2 viral copies this assay can detect is 131 copies/mL. A negative result does not preclude SARS-Cov-2 infection and should not be used as the sole basis for treatment or other patient management decisions. A negative result may occur with  improper specimen collection/handling, submission of specimen other than nasopharyngeal swab, presence of viral mutation(s) within the areas targeted by this assay, and inadequate number of viral copies (<131 copies/mL). A negative result must be combined with clinical observations, patient history, and epidemiological information. The expected result is Negative.  Fact Sheet for Patients:  PinkCheek.be  Fact Sheet for Healthcare Providers:  GravelBags.it  This test is no t yet approved or cleared by the [place] FDA and  has been authorized for detection and/or diagnosis of SARS-CoV-2 by FDA under an Emergency Use Authorization (EUA). This EUA will remain  in effect (meaning this test can be used) for the duration of the COVID-19 declaration under Section 564(b)(1) of the Act, 21 U.S.C. section 360bbb-3(b)(1), unless the authorization is terminated or revoked sooner.     Influenza A by PCR NEGATIVE NEGATIVE Final   Influenza B by PCR NEGATIVE NEGATIVE Final    Comment: (NOTE) The Xpert Xpress SARS-CoV-2/FLU/RSV assay is intended as an aid in  the diagnosis of influenza from Nasopharyngeal swab specimens and  should not be used as a sole basis for treatment. Nasal washings and  aspirates are unacceptable for Xpert Xpress SARS-CoV-2/FLU/RSV  testing.  Fact Sheet for Patients: PinkCheek.be  Fact Sheet for Healthcare Providers: GravelBags.it  This test is not yet approved or cleared by the [place] FDA and    has been authorized for detection and/or diagnosis of SARS-CoV-2 by  FDA under an Emergency Use Authorization (EUA). This EUA will remain  in effect (meaning this test can be used) for the duration of the  Covid-19 declaration under Section 564(b)(1) of the Act, 21  U.S.C. section 360bbb-3(b)(1), unless the authorization is  terminated or revoked. Performed at [facility], [place] [address], [place], [place] [address]   Culture, blood (routine x 2)     Status: None   Collection Time: 01/08/20  9:10 AM   Specimen: BLOOD  Result Value Ref Range Status   Specimen Description BLOOD RIGHT ANTECUBITAL  Final   Special Requests   Final    BOTTLES DRAWN AEROBIC AND ANAEROBIC Blood Culture adequate volume   Culture   Final    NO GROWTH 5 DAYS Performed at [facility], [place] [address] [place], [place] [address]    Report Status 01/13/2020 FINAL  Final  Culture, blood (routine x 2)     Status: None   Collection Time: 01/08/20  9:13 AM   Specimen: BLOOD  Result Value Ref Range Status   Specimen Description BLOOD LEFT ANTECUBITAL  Final   Special Requests  Final    BOTTLES DRAWN AEROBIC AND ANAEROBIC Blood Culture adequate volume   Culture   Final    NO GROWTH 5 DAYS Performed at [facility], [place] [address] [place], [place] [address]    Report Status 01/13/2020 FINAL  Final  Body fluid culture     Status: None   Collection Time: 01/08/20  3:01 PM   Specimen: Body Fluid  Result Value Ref Range Status   Specimen Description FLUID  Final   Special Requests DISC L1  Final   Gram Stain   Final    ABUNDANT WBC PRESENT,BOTH PMN AND MONONUCLEAR NO ORGANISMS SEEN    Culture   Final    NO GROWTH 3 DAYS Performed at [facility], [address], [place], [place] [address]    Report Status 01/12/2020 FINAL  Final  Anaerobic culture     Status: None   Collection Time: 01/08/20  3:01 PM   Specimen: Fluid  Result Value Ref Range Status   Specimen Description FLUID   Final   Special Requests DISC L1  Final   Culture   Final    NO ANAEROBES ISOLATED Performed at [facility], [place] [address] [place], [place] [address]    Report Status 01/13/2020 FINAL  Final  Aerobic/Anaerobic Culture (surgical/deep wound)     Status: None (Preliminary result)   Collection Time: 02/04/20 11:21 AM   Specimen: PATH Cytology Misc. fluid; Body Fluid  Result Value Ref Range Status   Specimen Description SYNOVIAL FLUID  Final   Special Requests RIGHT SHOULDER  Final   Gram Stain   Final    RARE WBC PRESENT, PREDOMINANTLY MONONUCLEAR NO ORGANISMS SEEN    Culture   Final    NO GROWTH 3 DAYS NO ANAEROBES ISOLATED; CULTURE IN PROGRESS FOR 5 DAYS Performed at [facility], [address], [place], [place] [address]    Report Status PENDING  Incomplete    Pertinent Lab. CBC Latest Ref Rng & Units 02/06/2020 02/01/2020 01/22/2020  WBC 4.0 - 10.5 K/uL 8.9 6.4 7.5  Hemoglobin 13.0 - 17.0 g/dL 11.6(L) 10.9(L) 11.8(L)  Hematocrit 39 - 52 % 37.1(L) 34.1(L) 38.5(L)  Platelets 150 - 400 K/uL 264 256 435(H)   CMP Latest Ref Rng & Units 02/07/2020 02/06/2020 02/04/2020  Glucose 70 - 99 mg/dL 130(H) 111(H) 113(H)  BUN 6 - 20 mg/dL $Remove'10 9 9  'pXXukip$ Creatinine 0.61 - 1.24 mg/dL 1.15 1.13 0.95  Sodium 135 - 145 mmol/L 137 137 136  Potassium 3.5 - 5.1 mmol/L 3.7 4.2 3.8  Chloride 98 - 111 mmol/L 104 104 103  CO2 22 - 32 mmol/L $RemoveB'24 24 25  'lwlFxeNl$ Calcium 8.9 - 10.3 mg/dL 9.2 9.7 9.0  Total Protein 6.5 - 8.1 g/dL - - -  Total Bilirubin 0.3 - 1.2 mg/dL - - -  Alkaline Phos 38 - 126 U/L - - -  AST 15 - 41 U/L - - -  ALT 0 - 44 U/L - - -     Pertinent Imaging today Plain films and CT images have been personally visualized and interpreted; radiology reports have been reviewed. Decision making incorporated into the Impression / Recommendations.  I have spent approx 30 minutes for this patient encounter including review of prior medical records with greater than 50% of time being face to  face and coordination of their care.

## 2020-02-08 NOTE — Progress Notes (Addendum)
Occupational Therapy Re-eval Patient Details Name: [name] MRN: [id number] DOB: [birth year] Today's Date: 02/08/2020    History of present illness 54 y/o male with PMH of discitis April 2021, s/p laminectormy, schizophrenia, cocaine use, presenting with mechanical fall in the shower with shoulder and back pain. Imaging reveals: progresive discitits osteomyelitis of LS spine, Septic arthriits at L3-4, spinal stenosis L1-2 and improved discitis at L1-2.  Underwent apsiration/core biopsy 10/11, neurosurgery recommends medical tx. Acute non displaced Fx of anterolateral L 9th rib; R shoulder with no acute findings.  Complains of R shoulder pain, OT ordered again 11/10.  Found with moderatelysevere osteoarthritis right glenohumeral joint.    OT comments  Patient re-eval completed due to increased R shoulder pain.  Patient demonstrating decreased functional use of R shoulder, guarding UE due to pain.  AROM limited to 45 degrees flexion and abduction, limited external rotation; able to tolerate passive range to 90 shoulder flexion and 45 abduction. Provided exercises below (HEP through [name] to promote increased ROM initially, will follow acutely to progress ROM, strength and functional use. ADL status and transfers remain at supervision to independent level. Recommend OP OT at dc.    Follow Up Recommendations  Outpatient OT;Supervision - Intermittent    Equipment Recommendations  3 in 1 bedside commode    Recommendations for Other Services      Precautions / Restrictions Precautions Precautions: None Restrictions Weight Bearing Restrictions: No       Mobility Bed Mobility Overal bed mobility: Independent                Transfers Overall transfer level: Modified independent                    Balance Overall balance assessment: Needs assistance Sitting-balance support: Feet supported Sitting balance-[name]: Good     Standing balance support:  No upper extremity supported;During functional activity Standing balance-[name]: Good                             ADL either performed or assessed with clinical judgement   ADL Overall ADL's : Needs assistance/impaired                 Upper Body Dressing : Supervision/safety;Sitting       Toilet Transfer: Modified Independent           Functional mobility during ADLs: Modified independent;Rolling walker General ADL Comments: session focused on R shoulder pain and exercises      Vision       Perception     Praxis      Cognition Arousal/Alertness: Awake/alert Behavior During Therapy: WFL for tasks assessed/performed Overall Cognitive Status: Within Functional Limits for tasks assessed                                          Exercises Exercises: Other exercises Other Exercises Other Exercises: provided HEP for R shoulder including shoulder elevation, shoulder retraction, lap/table slides  [id number] [employer]  Other Exercises: Completed wall slides x 5, tolerating up to 75* flexion of shoulder   Shoulder Instructions       General Comments      Pertinent Vitals/ Pain       Pain Assessment: Faces Faces Pain Scale: Hurts even more Pain Location: shoulder with movement  Pain Descriptors / Indicators:  Guarding;Discomfort Pain Intervention(s): Limited activity within patient's tolerance;Monitored during session;Repositioned  Home Living Family/patient expects to be discharged to:: Private residence Living Arrangements: Spouse/significant other Available Help at Discharge: Friend(s);Available 24 hours/day Type of Home: Apartment Home Access: Level entry     Home Layout: One level     Bathroom Shower/Tub: Chief Strategy Officer: Standard Bathroom Accessibility: Yes   Home Equipment: Environmental consultant - 4 wheels   Additional Comments: states his shower chair is broken      Prior Functioning/Environment Level of  Independence: Independent with assistive device(s)        Comments: uses rollator, independent ADLs/IADLs, not driving    Frequency  Min 2X/week        Progress Toward Goals  OT Goals(current goals can now be found in the care plan section)     Acute Rehab OT Goals Patient Stated Goal: less pain OT Goal Formulation: With patient Time For Goal Achievement: 02/22/20 Potential to Achieve Goals: Good  Plan      Co-evaluation                 AM-PAC OT "6 Clicks" Daily Activity     Outcome Measure   Help from another person eating meals?: None Help from another person taking care of personal grooming?: None Help from another person toileting, which includes using toliet, bedpan, or urinal?: None Help from another person bathing (including washing, rinsing, drying)?: A Little Help from another person to put on and taking off regular upper body clothing?: None Help from another person to put on and taking off regular lower body clothing?: None 6 Click Score: 23    End of Session    OT Visit Diagnosis: Pain Pain - Right/Left: Right Pain - part of body: Shoulder   Activity Tolerance Patient tolerated treatment well   Patient Left with call bell/phone within reach;Other (comment) (seated EOB )   Nurse Communication Mobility status        Time: 0960-4540 OT Time Calculation (min): 21 min  Charges: OT General Charges $OT Visit: 1 Visit OT Evaluation $OT Re-eval: 1 Re-eval  [name], OT Acute Rehabilitation Services Pager [phone] Office [phone]    [name] 02/08/2020, 3:06 PM

## 2020-02-09 DIAGNOSIS — M4626 Osteomyelitis of vertebra, lumbar region: Secondary | ICD-10-CM | POA: Diagnosis not present

## 2020-02-09 LAB — BASIC METABOLIC PANEL
Anion gap: 8 (ref 5–15)
BUN: 11 mg/dL (ref 6–20)
CO2: 23 mmol/L (ref 22–32)
Calcium: 9.1 mg/dL (ref 8.9–10.3)
Chloride: 106 mmol/L (ref 98–111)
Creatinine, Ser: 1.06 mg/dL (ref 0.61–1.24)
GFR, Estimated: >60 mL/min (ref >60)
Glucose, Bld: 114 mg/dL — ABNORMAL HIGH (ref 70–99)
Potassium: 3.8 mmol/L (ref 3.5–5.1)
Sodium: 137 mmol/L (ref 135–145)

## 2020-02-09 LAB — AEROBIC/ANAEROBIC CULTURE W GRAM STAIN (SURGICAL/DEEP WOUND): Culture: NO GROWTH

## 2020-02-09 NOTE — Progress Notes (Signed)
PROGRESS NOTE    [name]  ZOX:[id number] DOB: [birth year] DOA: 01/06/2020 PCP: [name], MD    Brief Narrative:  [name] is a 54 year old man PMH discitis April 2021 status post laminectomy, history of schizophrenia, cocaine use presented with shoulder and back pain after mechanical fall.  Found to have isolated rib fracture, lumbar progressive discitis and osteomyelitis with septic arthritis at L3-4 and associated epidural phlegmon.  Patient evaluated by neurosurgery with recommendation for medical treatment.  Seen by infectious disease.  Continues on 6 weeks of IV antibiotics.  Also developed worsening shoulder pain, seen by orthopedics with conclusion chronic inflammation and recommendation for surgery after a year infection free.   Assessment & Plan:   Principal Problem:   Acute osteomyelitis of lumbar spine (HCC) Active Problems:   Cocaine abuse (HCC)   Benign essential HTN   History of CVA (cerebrovascular accident)   Chronic pain syndrome   Closed fracture of one rib of left side   Discitis of lumbar region   Ankle ankylosis, left   Recurrent discitis, osteomyelitis L3-4 with associated epidural phlegmon --Continue 6 weeks antibiotics, plan completion November 21. --Continue pain management --Repeat MRI lumbar spine just prior to completion of antibiotics [facility] Nov 17/18) per ID and then reconsult ID for further recs to ensure patient does not require extended antibiotics  Large glenohumeral joint effusion thought to be posttraumatic Evaluated by Dr. Otelia Sergeant of [facility] orthopedics ON 02/07/2020.  Likely inflammatory fusion right shoulder with avascular necrosis and severe osteoarthritis, not a candidate for surgical joint replacement at this time. --Continue PT/OT efforts --follow-up w/ orthopedics as an outpatient; no surgery before 1 year post-infeciton.  PMH cocaine use, alcohol use --Counseled earlier in hospitalization  COPD --remains  stable  Schizophrenia --On Invega every 3 months.  Continue Cogentin, Wellbutrin and Remeron.  Acute nondisplaced fracture left ninth rib --Supportive care   DVT prophylaxis: Lovenox, SCDs, ambulation Code Status: Full code Family Communication: None present at bedside this morning  Disposition Plan:  Status is: Inpatient  Remains inpatient appropriate because:Unsafe d/c plan, IV treatments appropriate due to intensity of illness or inability to take PO and Inpatient level of care appropriate due to severity of illness   Dispo: The patient is from: Home              Anticipated d/c is to: Home              Anticipated d/c date is: > 3 days              Patient currently is not medically stable to d/c.   Consultants:   Infectious disease  Neurosurgery  Orthopedics, Dr. Otelia Sergeant  Interventional radiology  Procedures:   Unsuccessful disc aspiration lumbar spine  Antimicrobials:   Vancomycin  Cefepime  Subjective: Patient seen and examined at bedside, no complaints.  Was seen up and walking extensively throughout the hallways this morning.  "I need to get my exercise today".  Pain controlled.  No questions or concerns at this time.  Discussed with him regarding repeat MRI next week to further evaluate epidural phlegmon/abscess.  Denies headache, no fever/chills/night sweats, no nausea/vomiting/diarrhea, no chest pain, palpitations, no shortness of breath, no abdominal pain, no weakness, no fatigue, no paresthesia.  No acute events overnight per nursing staff.  Objective: Vitals:   02/08/20 1712 02/08/20 2119 02/09/20 0540 02/09/20 1008  BP: (!) 131/93 (!) 125/92 (!) 123/91 109/81  Pulse: 87 92 86 (!) 109  Resp: 18 18  18 18  Temp: 98.4 F (36.9 C) 98.2 F (36.8 C) 98.1 F (36.7 C) 98.3 F (36.8 C)  TempSrc:    Oral  SpO2: 100% 98% 98% 100%  Weight:  92.1 kg    Height:        Intake/Output Summary (Last 24 hours) at 02/09/2020 1412 Last data filed at  02/09/2020 0609 Gross per 24 hour  Intake 1420 ml  Output 0 ml  Net 1420 ml   Filed Weights   02/05/20 1735 02/07/20 1900 02/08/20 2119  Weight: 89.7 kg 92.1 kg 92.1 kg    Examination:  General exam: Appears calm and comfortable  Respiratory system: Clear to auscultation. Respiratory effort normal. Cardiovascular system: S1 & S2 heard, RRR. No JVD, murmurs, rubs, gallops or clicks. No pedal edema. Gastrointestinal system: Abdomen is nondistended, soft and nontender. No organomegaly or masses felt. Normal bowel sounds heard. Central nervous system: Alert and oriented. No focal neurological deficits. Extremities: Symmetric 5 x 5 power. Skin: No rashes, lesions or ulcers Psychiatry: Judgement and insight appear normal. Mood & affect appropriate.     Data Reviewed: I have personally reviewed following labs and imaging studies  CBC: Recent Labs  Lab 02/06/20 0243  WBC 8.9  HGB 11.6*  HCT 37.1*  MCV 86.9  PLT 264   Basic Metabolic Panel: Recent Labs  Lab 02/04/20 0438 02/06/20 0243 02/07/20 0459 02/09/20 0535  NA 136 137 137 137  K 3.8 4.2 3.7 3.8  CL 103 104 104 106  CO2 25 24 24 23   GLUCOSE 113* 111* 130* 114*  BUN 9 9 10 11   CREATININE 0.95 1.13 1.15 1.06  CALCIUM 9.0 9.7 9.2 9.1   GFR: Estimated Creatinine Clearance: 89.4 mL/min (by C-G formula based on SCr of 1.06 mg/dL). Liver Function Tests: No results for input(s): AST, ALT, ALKPHOS, BILITOT, PROT, ALBUMIN in the last 168 hours. No results for input(s): LIPASE, AMYLASE in the last 168 hours. No results for input(s): AMMONIA in the last 168 hours. Coagulation Profile: Recent Labs  Lab 02/04/20 1831  INR 0.9   Cardiac Enzymes: No results for input(s): CKTOTAL, CKMB, CKMBINDEX, TROPONINI in the last 168 hours. BNP (last 3 results) No results for input(s): PROBNP in the last 8760 hours. HbA1C: No results for input(s): HGBA1C in the last 72 hours. CBG: No results for input(s): GLUCAP in the last 168  hours. Lipid Profile: No results for input(s): CHOL, HDL, LDLCALC, TRIG, CHOLHDL, LDLDIRECT in the last 72 hours. Thyroid Function Tests: No results for input(s): TSH, T4TOTAL, FREET4, T3FREE, THYROIDAB in the last 72 hours. Anemia Panel: No results for input(s): VITAMINB12, FOLATE, FERRITIN, TIBC, IRON, RETICCTPCT in the last 72 hours. Sepsis Labs: No results for input(s): PROCALCITON, LATICACIDVEN in the last 168 hours.  Recent Results (from the past 240 hour(s))  Aerobic/Anaerobic Culture (surgical/deep wound)     Status: None (Preliminary result)   Collection Time: 02/04/20 11:21 AM   Specimen: PATH Cytology Misc. fluid; Body Fluid  Result Value Ref Range Status   Specimen Description SYNOVIAL FLUID  Final   Special Requests RIGHT SHOULDER  Final   Gram Stain   Final    RARE WBC PRESENT, PREDOMINANTLY MONONUCLEAR NO ORGANISMS SEEN    Culture   Final    NO GROWTH 4 DAYS NO ANAEROBES ISOLATED; CULTURE IN PROGRESS FOR 5 DAYS Performed at [facility], [address], [place], [place] [address]    Report Status PENDING  Incomplete  Radiology Studies: No results found.      Scheduled Meds: . benztropine  1 mg Oral Daily  . buPROPion  150 mg Oral Daily  . diclofenac Sodium  2 g Topical QID  . enoxaparin (LOVENOX) injection  40 mg Subcutaneous Q24H  . ferrous sulfate  325 mg Oral Q breakfast  . gabapentin  300 mg Oral BID  . lidocaine  1 patch Transdermal Q24H  . metoprolol succinate  12.5 mg Oral Daily  . mirtazapine  15 mg Oral QHS  . nicotine  14 mg Transdermal Daily  . pantoprazole  40 mg Oral Daily  . saccharomyces boulardii  250 mg Oral BID  . senna-docusate  1 tablet Oral BID  . sodium chloride flush  10-40 mL Intracatheter Q12H  . sodium chloride flush  3 mL Intravenous Q12H  . vitamin B-12  100 mcg Oral Daily  . Vitamin D (Ergocalciferol)  50,000 Units Oral Q7 days   Continuous Infusions: . sodium chloride 250 mL (01/30/20 1407)  .  ceFEPime (MAXIPIME) IV 2 g (02/09/20 1402)  . vancomycin 1,000 mg (02/09/20 0444)     LOS: 33 days    Time spent: 35 minutes spent on chart review, discussion with nursing staff, consultants, updating family and interview/physical exam; more than 50% of that time was spent in counseling and/or coordination of care.    Alvira Philips [place], DO [facility] Available via Epic secure chat 7am-7pm After these hours, please refer to coverage provider listed on [email or web address] 02/09/2020, 2:12 PM

## 2020-02-09 NOTE — Plan of Care (Signed)
  Problem: Health Behavior/Discharge Planning: Goal: Ability to manage health-related needs will improve Outcome: Progressing   

## 2020-02-10 DIAGNOSIS — M4626 Osteomyelitis of vertebra, lumbar region: Secondary | ICD-10-CM | POA: Diagnosis not present

## 2020-02-10 NOTE — Progress Notes (Signed)
Pharmacy Antibiotic Note  [name] is a 54 y.o. male admitted on 01/06/2020 with prior history of lumbar osteomyelitis/discitis in May-June 2021, now with back pain post fall at home and MRI showing lumbar osteomyelitis. Neurosurgery was unable to get successful disc aspiration during biopsy. Patient with history of L ankle Enterobacter infection, S/P hardware removal in 2019; ortho saw patient here, possible chronic vs advancing osteo. Pharmacy has been consulted for vancomycin and cefepime dosing.  Plan is for 6 wks of IV abx (vancomycin/cefepime, thru 02/18/20), then consolidate to PO doxycycline and cephalexin for another month.   Scr stable, CrCl 89.4 ml/min; will monitor renal function closely. WBC 8.9, afebrile  Plan: Continue vancomycin 1 gm IV Q 12 hrs Continue cefepime 2 gm IV Q 8 hrs BMET Q 48 hrs while on vancomycin; next on 11/14; monitor renal function closely, Scr stable Vancomycin trough weekly, or as indicated (target trough level: 15-20 mg/L)  Height: 5\' 9"  (175.3 cm) Weight: 92.1 kg (203 lb 0.9 oz) IBW/kg (Calculated) : 70.7  Temp (24hrs), Avg:98.2 F (36.8 C), Min:97.5 F (36.4 C), Max:98.7 F (37.1 C)  Recent Labs  Lab 02/04/20 0438 02/06/20 0243 02/07/20 0459 02/07/20 1513 02/09/20 0535  WBC  --  8.9  --   --   --   CREATININE 0.95 1.13 1.15  --  1.06  VANCOTROUGH  --   --   --  18  --     Estimated Creatinine Clearance: 89.4 mL/min (by C-G formula based on SCr of 1.06 mg/dL).    Allergies  Allergen Reactions  . Chlorhexidine Itching and Rash    Antimicrobials this admission: Ceftriaxone 10/11 >> 10/14 Vancomycin 10/11 >> (11/21) Cefepime 10/14 >> (11/21) Keflex/Doxy to begin 11/22 for a month >> (not entered yet)  Dose adjustments / vancomycin trough levels: 10/15 VT = 22 mcg/ml but dose prior PM dose was given ~4 hours late so true trough ~15 mcg/ml (therapeutic) on 1500 mg IV q12h> no change 10/21 VT = 23 mcg/ml , drawn ~50 min early, on  1500 mg IV q12h > decr to 1250 mg IV q12h 10/25 VT= 21 mcg/ml - on 1250 mg IV q12h > decr to 1gm IV q12h 10/28 VT = 17 mcg/ml - continue 1 gm IV q12h 11/4 VT = 17 mg/L - continue vancomycin 1 gm IV q12h 11/10 VT = 18 mg/L - continue vancomycin 1 gm IV Q 12 hrs  Microbiology results: 10/11 fluid, disc L1 cx: NG/final 10/11 Blood x 2: NG/final 10/10 COVID, flu: negative 11/7 R shoulder synovial fluid: NOS on Gram stain, cx NG at 3 days  Thank you for allowing pharmacy to be a part of this patient's care.  Dimple Nanas, PharmD PGY-1 Acute Care Pharmacy Resident Office: [phone] 02/10/2020 12:16 PM

## 2020-02-10 NOTE — Progress Notes (Signed)
PROGRESS NOTE    [name]  QQV:[id number] DOB: [birth year] DOA: 01/06/2020 PCP: [name], MD    Brief Narrative:  [name] is a 54 year old man PMH discitis April 2021 status post laminectomy, history of schizophrenia, cocaine use presented with shoulder and back pain after mechanical fall.  Found to have isolated rib fracture, lumbar progressive discitis and osteomyelitis with septic arthritis at L3-4 and associated epidural phlegmon.  Patient evaluated by neurosurgery with recommendation for medical treatment.  Seen by infectious disease.  Continues on 6 weeks of IV antibiotics.  Also developed worsening shoulder pain, seen by orthopedics with conclusion chronic inflammation and recommendation for surgery after a year infection free.   Assessment & Plan:   Principal Problem:   Acute osteomyelitis of lumbar spine (HCC) Active Problems:   Cocaine abuse (HCC)   Benign essential HTN   History of CVA (cerebrovascular accident)   Chronic pain syndrome   Closed fracture of one rib of left side   Discitis of lumbar region   Ankle ankylosis, left   Recurrent discitis, osteomyelitis L3-4 with associated epidural phlegmon --Continue 6 weeks antibiotics, plan completion November 21. --Continue pain management --Repeat MRI lumbar spine just prior to completion of antibiotics [facility] Nov 17/18) per ID and then reconsult ID for further recs to ensure patient does not require extended antibiotics  Large glenohumeral joint effusion thought to be posttraumatic Evaluated by Dr. Otelia Sergeant of [facility] orthopedics ON 02/07/2020.  Likely inflammatory fusion right shoulder with avascular necrosis and severe osteoarthritis, not a candidate for surgical joint replacement at this time. --Continue PT/OT efforts --follow-up w/ orthopedics as an outpatient; no surgery before 1 year post-infeciton.  PMH cocaine use, alcohol use --Counseled earlier in hospitalization  COPD --remains  stable  Schizophrenia --On Invega every 3 months.  Continue Cogentin, Wellbutrin and Remeron.  Acute nondisplaced fracture left ninth rib --Supportive care   DVT prophylaxis: Lovenox, SCDs, ambulation Code Status: Full code Family Communication: None present at bedside this morning  Disposition Plan:  Status is: Inpatient  Remains inpatient appropriate because:Unsafe d/c plan, IV treatments appropriate due to intensity of illness or inability to take PO and Inpatient level of care appropriate due to severity of illness   Dispo: The patient is from: Home              Anticipated d/c is to: Home              Anticipated d/c date is: > 3 days              Patient currently is not medically stable to d/c.   Consultants:   Infectious disease  Neurosurgery  Orthopedics, Dr. Otelia Sergeant  Interventional radiology  Procedures:   Unsuccessful disc aspiration lumbar spine  Antimicrobials:   Vancomycin  Cefepime  Subjective: Patient seen and examined at bedside, sleeping, complains of some mild low back pain.  No other complaints at this time.  Denies headache, no fever/chills/night sweats, no nausea/vomiting/diarrhea, no chest pain, palpitations, no shortness of breath, no abdominal pain, no weakness, no fatigue, no paresthesia.  No acute events overnight per nursing staff.  Objective: Vitals:   02/09/20 2140 02/10/20 0507 02/10/20 0936 02/10/20 1128  BP: 133/79 120/85 112/75 (!) 123/91  Pulse: (!) 108 100 (!) 106 (!) 109  Resp: 18 17 17 18   Temp: 98.2 F (36.8 C) (!) 97.5 F (36.4 C) 98.3 F (36.8 C) 98.5 F (36.9 C)  TempSrc: Oral Oral  Oral  SpO2: 98% 94% 99%  97%  Weight: 92.1 kg     Height:        Intake/Output Summary (Last 24 hours) at 02/10/2020 1453 Last data filed at 02/10/2020 1409 Gross per 24 hour  Intake 1882.13 ml  Output 1325 ml  Net 557.13 ml   Filed Weights   02/07/20 1900 02/08/20 2119 02/09/20 2140  Weight: 92.1 kg 92.1 kg 92.1 kg     Examination:  General exam: Appears calm and comfortable  Respiratory system: Clear to auscultation. Respiratory effort normal. Cardiovascular system: S1 & S2 heard, RRR. No JVD, murmurs, rubs, gallops or clicks. No pedal edema. Gastrointestinal system: Abdomen is nondistended, soft and nontender. No organomegaly or masses felt. Normal bowel sounds heard. Central nervous system: Alert and oriented. No focal neurological deficits. Extremities: Symmetric 5 x 5 power. Skin: No rashes, lesions or ulcers Psychiatry: Judgement and insight appear normal. Mood & affect appropriate.     Data Reviewed: I have personally reviewed following labs and imaging studies  CBC: Recent Labs  Lab 02/06/20 0243  WBC 8.9  HGB 11.6*  HCT 37.1*  MCV 86.9  PLT 264   Basic Metabolic Panel: Recent Labs  Lab 02/04/20 0438 02/06/20 0243 02/07/20 0459 02/09/20 0535  NA 136 137 137 137  K 3.8 4.2 3.7 3.8  CL 103 104 104 106  CO2 25 24 24 23   GLUCOSE 113* 111* 130* 114*  BUN 9 9 10 11   CREATININE 0.95 1.13 1.15 1.06  CALCIUM 9.0 9.7 9.2 9.1   GFR: Estimated Creatinine Clearance: 89.4 mL/min (by C-G formula based on SCr of 1.06 mg/dL). Liver Function Tests: No results for input(s): AST, ALT, ALKPHOS, BILITOT, PROT, ALBUMIN in the last 168 hours. No results for input(s): LIPASE, AMYLASE in the last 168 hours. No results for input(s): AMMONIA in the last 168 hours. Coagulation Profile: Recent Labs  Lab 02/04/20 1831  INR 0.9   Cardiac Enzymes: No results for input(s): CKTOTAL, CKMB, CKMBINDEX, TROPONINI in the last 168 hours. BNP (last 3 results) No results for input(s): PROBNP in the last 8760 hours. HbA1C: No results for input(s): HGBA1C in the last 72 hours. CBG: No results for input(s): GLUCAP in the last 168 hours. Lipid Profile: No results for input(s): CHOL, HDL, LDLCALC, TRIG, CHOLHDL, LDLDIRECT in the last 72 hours. Thyroid Function Tests: No results for input(s): TSH,  T4TOTAL, FREET4, T3FREE, THYROIDAB in the last 72 hours. Anemia Panel: No results for input(s): VITAMINB12, FOLATE, FERRITIN, TIBC, IRON, RETICCTPCT in the last 72 hours. Sepsis Labs: No results for input(s): PROCALCITON, LATICACIDVEN in the last 168 hours.  Recent Results (from the past 240 hour(s))  Aerobic/Anaerobic Culture (surgical/deep wound)     Status: None   Collection Time: 02/04/20 11:21 AM   Specimen: PATH Cytology Misc. fluid; Body Fluid  Result Value Ref Range Status   Specimen Description SYNOVIAL FLUID  Final   Special Requests RIGHT SHOULDER  Final   Gram Stain   Final    RARE WBC PRESENT, PREDOMINANTLY MONONUCLEAR NO ORGANISMS SEEN    Culture   Final    No growth aerobically or anaerobically. Performed at [facility], [address], [place], [place] [address]    Report Status 02/09/2020 FINAL  Final         Radiology Studies: No results found.      Scheduled Meds: . benztropine  1 mg Oral Daily  . buPROPion  150 mg Oral Daily  . diclofenac Sodium  2 g Topical QID  .  enoxaparin (LOVENOX) injection  40 mg Subcutaneous Q24H  . ferrous sulfate  325 mg Oral Q breakfast  . gabapentin  300 mg Oral BID  . lidocaine  1 patch Transdermal Q24H  . metoprolol succinate  12.5 mg Oral Daily  . mirtazapine  15 mg Oral QHS  . nicotine  14 mg Transdermal Daily  . pantoprazole  40 mg Oral Daily  . saccharomyces boulardii  250 mg Oral BID  . senna-docusate  1 tablet Oral BID  . sodium chloride flush  10-40 mL Intracatheter Q12H  . sodium chloride flush  3 mL Intravenous Q12H  . vitamin B-12  100 mcg Oral Daily  . Vitamin D (Ergocalciferol)  50,000 Units Oral Q7 days   Continuous Infusions: . sodium chloride 250 mL (01/30/20 1407)  . ceFEPime (MAXIPIME) IV 2 g (02/10/20 1409)  . vancomycin 250 mL/hr at 02/10/20 0557     LOS: 34 days    Time spent: 34 minutes spent on chart review, discussion with nursing staff, consultants, updating family and  interview/physical exam; more than 50% of that time was spent in counseling and/or coordination of care.    Alvira Philips [place], DO [facility] Available via Epic secure chat 7am-7pm After these hours, please refer to coverage provider listed on [email or web address] 02/10/2020, 2:53 PM

## 2020-02-10 NOTE — Plan of Care (Signed)
  Problem: Pain Managment: Goal: General experience of comfort will improve Outcome: Progressing   

## 2020-02-11 DIAGNOSIS — M4626 Osteomyelitis of vertebra, lumbar region: Secondary | ICD-10-CM | POA: Diagnosis not present

## 2020-02-11 LAB — BASIC METABOLIC PANEL
Anion gap: 9 (ref 5–15)
BUN: 10 mg/dL (ref 6–20)
CO2: 24 mmol/L (ref 22–32)
Calcium: 8.9 mg/dL (ref 8.9–10.3)
Chloride: 104 mmol/L (ref 98–111)
Creatinine, Ser: 1.11 mg/dL (ref 0.61–1.24)
GFR, Estimated: >60 mL/min (ref >60)
Glucose, Bld: 91 mg/dL (ref 70–99)
Potassium: 3.6 mmol/L (ref 3.5–5.1)
Sodium: 137 mmol/L (ref 135–145)

## 2020-02-11 NOTE — Progress Notes (Signed)
PROGRESS NOTE    [name]  YQI:[id number] DOB: 1965/11/07 DOA: 01/06/2020 PCP: [name], MD    Brief Narrative:  [name] is a 54 year old man PMH discitis April 2021 status post laminectomy, history of schizophrenia, cocaine use presented with shoulder and back pain after mechanical fall.  Found to have isolated rib fracture, lumbar progressive discitis and osteomyelitis with septic arthritis at L3-4 and associated epidural phlegmon.  Patient evaluated by neurosurgery with recommendation for medical treatment.  Seen by infectious disease.  Continues on 6 weeks of IV antibiotics.  Also developed worsening shoulder pain, seen by orthopedics with conclusion chronic inflammation and recommendation for surgery after a year infection free.   Assessment & Plan:   Principal Problem:   Acute osteomyelitis of lumbar spine (HCC) Active Problems:   Cocaine abuse (HCC)   Benign essential HTN   History of CVA (cerebrovascular accident)   Chronic pain syndrome   Closed fracture of one rib of left side   Discitis of lumbar region   Ankle ankylosis, left   Recurrent discitis, osteomyelitis L3-4 with associated epidural phlegmon --Continue 6 weeks antibiotics, plan completion November 21. --Continue pain management --Repeat MRI lumbar spine just prior to completion of antibiotics [facility] Nov 17/18) per ID and then reconsult ID for further recs to ensure patient does not require extended antibiotics  Large glenohumeral joint effusion thought to be posttraumatic Evaluated by Dr. Otelia Sergeant of [facility] orthopedics ON 02/07/2020.  Likely inflammatory fusion right shoulder with avascular necrosis and severe osteoarthritis, not a candidate for surgical joint replacement at this time. --Continue PT/OT efforts --follow-up w/ orthopedics as an outpatient; no surgery before 1 year post-infeciton.  PMH cocaine use, alcohol use --Counseled earlier in hospitalization  COPD --remains  stable  Schizophrenia --On Invega every 3 months.  Continue Cogentin, Wellbutrin and Remeron.  Acute nondisplaced fracture left ninth rib --Supportive care   DVT prophylaxis: Lovenox, SCDs, ambulation Code Status: Full code Family Communication: None present at bedside this morning  Disposition Plan:  Status is: Inpatient  Remains inpatient appropriate because:Unsafe d/c plan, IV treatments appropriate due to intensity of illness or inability to take PO and Inpatient level of care appropriate due to severity of illness   Dispo: The patient is from: Home              Anticipated d/c is to: Home              Anticipated d/c date is: > 3 days              Patient currently is not medically stable to d/c.   Consultants:   Infectious disease  Neurosurgery  Orthopedics, Dr. Otelia Sergeant  Interventional radiology  Procedures:   Unsuccessful disc aspiration lumbar spine  Antimicrobials:   Vancomycin  Cefepime  Subjective: Patient seen and examined at bedside, sitting at bedside, no complaints this morning.  Asks if plan for likely discharge on November 21 is still in place, discussed with him all dependence on follow-up MRI results later this week.  Denies headache, no fever/chills/night sweats, no nausea/vomiting/diarrhea, no chest pain, palpitations, no shortness of breath, no abdominal pain, no weakness, no fatigue, no paresthesia.  No acute events overnight per nursing staff.  Objective: Vitals:   02/10/20 1720 02/10/20 2112 02/11/20 0509 02/11/20 0959  BP: (!) 141/91 118/76 90/60 127/78  Pulse: (!) 101 (!) 103 100 95  Resp: 19 18 18 17   Temp: 98.4 F (36.9 C) 99.2 F (37.3 C) 98.9 F (37.2  C) 98.1 F (36.7 C)  TempSrc:  Oral Axillary Oral  SpO2: 99% 98% 96% 99%  Weight:   93.1 kg   Height:        Intake/Output Summary (Last 24 hours) at 02/11/2020 1348 Last data filed at 02/11/2020 1300 Gross per 24 hour  Intake 1870.46 ml  Output 1000 ml  Net 870.46 ml    Filed Weights   02/08/20 2119 02/09/20 2140 02/11/20 0509  Weight: 92.1 kg 92.1 kg 93.1 kg    Examination:  General exam: Appears calm and comfortable  Respiratory system: Clear to auscultation. Respiratory effort normal. Cardiovascular system: S1 & S2 heard, RRR. No JVD, murmurs, rubs, gallops or clicks. No pedal edema. Gastrointestinal system: Abdomen is nondistended, soft and nontender. No organomegaly or masses felt. Normal bowel sounds heard. Central nervous system: Alert and oriented. No focal neurological deficits. Extremities: Symmetric 5 x 5 power. Skin: No rashes, lesions or ulcers Psychiatry: Judgement and insight appear normal. Mood & affect appropriate.     Data Reviewed: I have personally reviewed following labs and imaging studies  CBC: Recent Labs  Lab 02/06/20 0243  WBC 8.9  HGB 11.6*  HCT 37.1*  MCV 86.9  PLT 264   Basic Metabolic Panel: Recent Labs  Lab 02/06/20 0243 02/07/20 0459 02/09/20 0535 02/11/20 0533  NA 137 137 137 137  K 4.2 3.7 3.8 3.6  CL 104 104 106 104  CO2 24 24 23 24   GLUCOSE 111* 130* 114* 91  BUN 9 10 11 10   CREATININE 1.13 1.15 1.06 1.11  CALCIUM 9.7 9.2 9.1 8.9   GFR: Estimated Creatinine Clearance: 85.8 mL/min (by C-G formula based on SCr of 1.11 mg/dL). Liver Function Tests: No results for input(s): AST, ALT, ALKPHOS, BILITOT, PROT, ALBUMIN in the last 168 hours. No results for input(s): LIPASE, AMYLASE in the last 168 hours. No results for input(s): AMMONIA in the last 168 hours. Coagulation Profile: Recent Labs  Lab 02/04/20 1831  INR 0.9   Cardiac Enzymes: No results for input(s): CKTOTAL, CKMB, CKMBINDEX, TROPONINI in the last 168 hours. BNP (last 3 results) No results for input(s): PROBNP in the last 8760 hours. HbA1C: No results for input(s): HGBA1C in the last 72 hours. CBG: No results for input(s): GLUCAP in the last 168 hours. Lipid Profile: No results for input(s): CHOL, HDL, LDLCALC, TRIG,  CHOLHDL, LDLDIRECT in the last 72 hours. Thyroid Function Tests: No results for input(s): TSH, T4TOTAL, FREET4, T3FREE, THYROIDAB in the last 72 hours. Anemia Panel: No results for input(s): VITAMINB12, FOLATE, FERRITIN, TIBC, IRON, RETICCTPCT in the last 72 hours. Sepsis Labs: No results for input(s): PROCALCITON, LATICACIDVEN in the last 168 hours.  Recent Results (from the past 240 hour(s))  Aerobic/Anaerobic Culture (surgical/deep wound)     Status: None   Collection Time: 02/04/20 11:21 AM   Specimen: PATH Cytology Misc. fluid; Body Fluid  Result Value Ref Range Status   Specimen Description SYNOVIAL FLUID  Final   Special Requests RIGHT SHOULDER  Final   Gram Stain   Final    RARE WBC PRESENT, PREDOMINANTLY MONONUCLEAR NO ORGANISMS SEEN    Culture   Final    No growth aerobically or anaerobically. Performed at [facility], [address], [place], [place] [address]    Report Status 02/09/2020 FINAL  Final         Radiology Studies: No results found.      Scheduled Meds: . benztropine  1 mg Oral Daily  . buPROPion  150 mg Oral Daily  . diclofenac Sodium  2 g Topical QID  . enoxaparin (LOVENOX) injection  40 mg Subcutaneous Q24H  . ferrous sulfate  325 mg Oral Q breakfast  . gabapentin  300 mg Oral BID  . lidocaine  1 patch Transdermal Q24H  . metoprolol succinate  12.5 mg Oral Daily  . mirtazapine  15 mg Oral QHS  . nicotine  14 mg Transdermal Daily  . pantoprazole  40 mg Oral Daily  . saccharomyces boulardii  250 mg Oral BID  . senna-docusate  1 tablet Oral BID  . sodium chloride flush  10-40 mL Intracatheter Q12H  . sodium chloride flush  3 mL Intravenous Q12H  . vitamin B-12  100 mcg Oral Daily  . Vitamin D (Ergocalciferol)  50,000 Units Oral Q7 days   Continuous Infusions: . sodium chloride 250 mL (01/30/20 1407)  . ceFEPime (MAXIPIME) IV 2 g (02/11/20 0558)  . vancomycin 1,000 mg (02/11/20 0332)     LOS: 35 days    Time spent: 34  minutes spent on chart review, discussion with nursing staff, consultants, updating family and interview/physical exam; more than 50% of that time was spent in counseling and/or coordination of care.    Alvira Philips [place], DO [facility] Available via Epic secure chat 7am-7pm After these hours, please refer to coverage provider listed on [email or web address] 02/11/2020, 1:48 PM

## 2020-02-12 ENCOUNTER — Inpatient Hospital Stay: Payer: Self-pay

## 2020-02-12 DIAGNOSIS — M4626 Osteomyelitis of vertebra, lumbar region: Secondary | ICD-10-CM | POA: Diagnosis not present

## 2020-02-12 NOTE — Progress Notes (Addendum)
Occupational Therapy Treatment Patient Details Name: [name] MRN: [id number] DOB: [birth year] Today's Date: 02/12/2020    History of present illness 54 y/o male with PMH of discitis April 2021, s/p laminectormy, schizophrenia, cocaine use, presenting with mechanical fall in the shower with shoulder and back pain. Imaging reveals: progresive discitits osteomyelitis of LS spine, Septic arthriits at L3-4, spinal stenosis L1-2 and improved discitis at L1-2.  Underwent apsiration/core biopsy 10/11, neurosurgery recommends medical tx. Acute non displaced Fx of anterolateral L 9th rib; R shoulder with no acute findings.  Complains of R shoulder pain, OT ordered again 11/10.  Found with moderatelysevere osteoarthritis right glenohumeral joint.    OT comments  Pt received sitting up in recliner, awake and alert, agreeable to session. Slowly progressing toward established OT goals, however, still limited with pain and ROM to safely and effectively perform ADLs. Session address UE exercises with AAROM for RUE shd flexion, table slides, and shoulders shrugs with some pain reported with movement when performing given HEP exercises. Pt currently complains of some pain with L shd flexion during AAROM. Pt requested ointment for pain of shd, and administered during session. Pt will benefit from continued OT in outpatient setting to address safety, strength/ROM and compensatory strategies to maximize independence for ADLs. DC and freq remains the same.    Follow Up Recommendations  Outpatient OT;Supervision - Intermittent    Equipment Recommendations  3 in 1 bedside commode    Recommendations for Other Services      Precautions / Restrictions Precautions Precautions: None Precaution Booklet Issued: No Precaution Comments: low fall       Mobility Bed Mobility Overal bed mobility: Independent             General bed mobility comments: independent for bed mobility, knows how to log roll out of  bed  Transfers Overall transfer level: Modified independent Equipment used: None             General transfer comment: No transfers performed, mostly UE exercises and ROM    Balance Overall balance assessment: Needs assistance Sitting-balance support: Feet supported Sitting balance-[name]: Good     Standing balance support: No upper extremity supported;During functional activity Standing balance-[name]: Good Standing balance comment: unsteadiy without UE support, needed MinA of one person when walking in hall without IV pole or RW               High Level Balance Comments: pt reaching for UE support in room           ADL either performed or assessed with clinical judgement   ADL Overall ADL's : Needs assistance/impaired                     Lower Body Dressing: Modified independent;Sit to/from stand Lower Body Dressing Details (indicate cue type and reason): Pt adjusted sock while seated in recliner with figure 4 positioning.             Functional mobility during ADLs: Modified independent;Rolling walker General ADL Comments: session focused on R shoulder pain and exercises, but pt did stand with Mod I for safety with RN.     Vision   Vision Assessment?: No apparent visual deficits   Perception     Praxis      Cognition Arousal/Alertness: Awake/alert Behavior During Therapy: WFL for tasks assessed/performed Overall Cognitive Status: Within Functional Limits for tasks assessed  General Comments: Pt currently reports some limitations and pain of L shoulder when perform AAROM for RUE shd.        Exercises Exercises: Other exercises Other Exercises Other Exercises: provided HEP for R shoulder including shoulder elevation, shoulder retraction, lap/table slides  [id number] [employer]  Other Exercises: table slides with towel, 2 x 10 reps Other Exercises: towel slide circular motion, cueing  for controlled movement. 2 x 5 reps Other Exercises: Shd shrugs in sitting   Shoulder Instructions       General Comments pt instructed to perform HEP given prior to todays session later this evening to improve ROM of R shd.    Pertinent Vitals/ Pain       Pain Assessment: Faces Faces Pain Scale: Hurts even more Pain Location: shoulder with movement  Pain Descriptors / Indicators: Guarding;Discomfort Pain Intervention(s): Monitored during session;Repositioned  Home Living                                          Prior Functioning/Environment              Frequency  Min 2X/week        Progress Toward Goals  OT Goals(current goals can now be found in the care plan section)  Progress towards OT goals: Progressing toward goals  Acute Rehab OT Goals Patient Stated Goal: less pain OT Goal Formulation: With patient Time For Goal Achievement: 02/22/20 Potential to Achieve Goals: Good ADL Goals Pt Will Perform Grooming: with modified independence;standing Pt Will Perform Lower Body Dressing: with modified independence;sit to/from stand Pt Will Transfer to Toilet: with modified independence;ambulating;bedside commode Pt Will Perform Toileting - Clothing Manipulation and hygiene: with modified independence;sit to/from stand Pt Will Perform Tub/Shower Transfer: Tub transfer;with modified independence;ambulating;3 in 1 Pt/caregiver will Perform Home Exercise Program: Increased ROM;Increased strength;Right Upper extremity;Independently;With written HEP provided Additional ADL Goal #1: Patient will recall and utilize 3 fall prevention techniques during ADL routine to optimize independence and safety.  Plan All goals met and education completed, patient discharged from OT services    Co-evaluation                 AM-PAC OT "6 Clicks" Daily Activity     Outcome Measure   Help from another person eating meals?: None Help from another person taking  care of personal grooming?: None Help from another person toileting, which includes using toliet, bedpan, or urinal?: None Help from another person bathing (including washing, rinsing, drying)?: A Little Help from another person to put on and taking off regular upper body clothing?: None Help from another person to put on and taking off regular lower body clothing?: None 6 Click Score: 23    End of Session Equipment Utilized During Treatment: Gait belt;Rolling walker  OT Visit Diagnosis: Pain Pain - Right/Left: Right Pain - part of body: Shoulder   Activity Tolerance Patient tolerated treatment well   Patient Left with call bell/phone within reach;Other (comment) (seated EOB )   Nurse Communication Mobility status        Time: 2952-8413 OT Time Calculation (min): 23 min  Charges: OT General Charges $OT Visit: 1 Visit OT Treatments $Therapeutic Exercise: 23-37 mins  Marquette Old, MSOT, OTR/L  [facility]  [phone]    Zigmund Daniel 02/12/2020, 2:39 PM

## 2020-02-12 NOTE — Plan of Care (Signed)
  Problem: Health Behavior/Discharge Planning: Goal: Ability to manage health-related needs will improve Outcome: Progressing   Problem: Clinical Measurements: Goal: Will remain free from infection Outcome: Progressing   Problem: Pain Managment: Goal: General experience of comfort will improve Outcome: Progressing   

## 2020-02-12 NOTE — Progress Notes (Signed)
PROGRESS NOTE    [name]  UJW:[id number] DOB: [birth year] DOA: 01/06/2020 PCP: [name], MD    Brief Narrative:  [name] is a 54 year old man PMH discitis April 2021 status post laminectomy, history of schizophrenia, cocaine use presented with shoulder and back pain after mechanical fall.  Found to have isolated rib fracture, lumbar progressive discitis and osteomyelitis with septic arthritis at L3-4 and associated epidural phlegmon.  Patient evaluated by neurosurgery with recommendation for medical treatment.  Seen by infectious disease.  Continues on 6 weeks of IV antibiotics.  Also developed worsening shoulder pain, seen by orthopedics with conclusion chronic inflammation and recommendation for surgery after a year infection free.   Assessment & Plan:   Principal Problem:   Acute osteomyelitis of lumbar spine (HCC) Active Problems:   Cocaine abuse (HCC)   Benign essential HTN   History of CVA (cerebrovascular accident)   Chronic pain syndrome   Closed fracture of one rib of left side   Discitis of lumbar region   Ankle ankylosis, left   Recurrent discitis, osteomyelitis L3-4 with associated epidural phlegmon --Continue 6 weeks antibiotics, plan completion November 21. --Continue pain management --Repeat MRI lumbar spine just prior to completion of antibiotics [facility] Nov 17/18) per ID and then reconsult ID for further recs to ensure patient does not require extended antibiotics  Large glenohumeral joint effusion thought to be posttraumatic Evaluated by Dr. Otelia Sergeant of [facility] orthopedics ON 02/07/2020.  Likely inflammatory fusion right shoulder with avascular necrosis and severe osteoarthritis, not a candidate for surgical joint replacement at this time. --Continue PT/OT efforts --follow-up w/ orthopedics as an outpatient; no surgery before 1 year post-infeciton.  PMH cocaine use, alcohol use --Counseled earlier in hospitalization  COPD --remains  stable  Schizophrenia --On Invega every 3 months.  Continue Cogentin, Wellbutrin and Remeron.  Acute nondisplaced fracture left ninth rib --Supportive care   DVT prophylaxis: Lovenox, SCDs, ambulation Code Status: Full code Family Communication: None present at bedside this morning  Disposition Plan:  Status is: Inpatient  Remains inpatient appropriate because:Unsafe d/c plan, IV treatments appropriate due to intensity of illness or inability to take PO and Inpatient level of care appropriate due to severity of illness   Dispo: The patient is from: Home              Anticipated d/c is to: Home              Anticipated d/c date is: > 3 days              Patient currently is not medically stable to d/c.   Consultants:   Infectious disease  Neurosurgery  Orthopedics, Dr. Otelia Sergeant  Interventional radiology  Procedures:   Unsuccessful disc aspiration lumbar spine  Antimicrobials:   Vancomycin  Cefepime  Subjective: Patient seen and examined at bedside, sitting in bedside chair eating breakfast.  No complaints this morning. Denies headache, no fever/chills/night sweats, no nausea/vomiting/diarrhea, no chest pain, palpitations, no shortness of breath, no abdominal pain, no weakness, no fatigue, no paresthesia.  No acute events overnight per nursing staff.  Objective: Vitals:   02/11/20 1755 02/11/20 2151 02/12/20 0500 02/12/20 0913  BP: (!) 149/100 (!) 148/95 115/81 120/87  Pulse: 95 94  (!) 106  Resp: 18 18 16 18   Temp: 98.1 F (36.7 C) 98.3 F (36.8 C) 98.3 F (36.8 C) 98.5 F (36.9 C)  TempSrc:  Oral Oral Oral  SpO2: 100% 100% 100% 98%  Weight:  Height:        Intake/Output Summary (Last 24 hours) at 02/12/2020 1203 Last data filed at 02/12/2020 0000 Gross per 24 hour  Intake 1793.72 ml  Output 452 ml  Net 1341.72 ml   Filed Weights   02/08/20 2119 02/09/20 2140 02/11/20 0509  Weight: 92.1 kg 92.1 kg 93.1 kg    Examination:  General exam:  Appears calm and comfortable  Respiratory system: Clear to auscultation. Respiratory effort normal. Cardiovascular system: S1 & S2 heard, RRR. No JVD, murmurs, rubs, gallops or clicks. No pedal edema. Gastrointestinal system: Abdomen is nondistended, soft and nontender. No organomegaly or masses felt. Normal bowel sounds heard. Central nervous system: Alert and oriented. No focal neurological deficits. Extremities: Symmetric 5 x 5 power. Skin: No rashes, lesions or ulcers Psychiatry: Judgement and insight appear normal. Mood & affect appropriate.     Data Reviewed: I have personally reviewed following labs and imaging studies  CBC: Recent Labs  Lab 02/06/20 0243  WBC 8.9  HGB 11.6*  HCT 37.1*  MCV 86.9  PLT 264   Basic Metabolic Panel: Recent Labs  Lab 02/06/20 0243 02/07/20 0459 02/09/20 0535 02/11/20 0533  NA 137 137 137 137  K 4.2 3.7 3.8 3.6  CL 104 104 106 104  CO2 24 24 23 24   GLUCOSE 111* 130* 114* 91  BUN 9 10 11 10   CREATININE 1.13 1.15 1.06 1.11  CALCIUM 9.7 9.2 9.1 8.9   GFR: Estimated Creatinine Clearance: 85.8 mL/min (by C-G formula based on SCr of 1.11 mg/dL). Liver Function Tests: No results for input(s): AST, ALT, ALKPHOS, BILITOT, PROT, ALBUMIN in the last 168 hours. No results for input(s): LIPASE, AMYLASE in the last 168 hours. No results for input(s): AMMONIA in the last 168 hours. Coagulation Profile: No results for input(s): INR, PROTIME in the last 168 hours. Cardiac Enzymes: No results for input(s): CKTOTAL, CKMB, CKMBINDEX, TROPONINI in the last 168 hours. BNP (last 3 results) No results for input(s): PROBNP in the last 8760 hours. HbA1C: No results for input(s): HGBA1C in the last 72 hours. CBG: No results for input(s): GLUCAP in the last 168 hours. Lipid Profile: No results for input(s): CHOL, HDL, LDLCALC, TRIG, CHOLHDL, LDLDIRECT in the last 72 hours. Thyroid Function Tests: No results for input(s): TSH, T4TOTAL, FREET4, T3FREE,  THYROIDAB in the last 72 hours. Anemia Panel: No results for input(s): VITAMINB12, FOLATE, FERRITIN, TIBC, IRON, RETICCTPCT in the last 72 hours. Sepsis Labs: No results for input(s): PROCALCITON, LATICACIDVEN in the last 168 hours.  Recent Results (from the past 240 hour(s))  Aerobic/Anaerobic Culture (surgical/deep wound)     Status: None   Collection Time: 02/04/20 11:21 AM   Specimen: PATH Cytology Misc. fluid; Body Fluid  Result Value Ref Range Status   Specimen Description SYNOVIAL FLUID  Final   Special Requests RIGHT SHOULDER  Final   Gram Stain   Final    RARE WBC PRESENT, PREDOMINANTLY MONONUCLEAR NO ORGANISMS SEEN    Culture   Final    No growth aerobically or anaerobically. Performed at [facility], [address] [place], [place] [address]    Report Status 02/09/2020 FINAL  Final         Radiology Studies: No results found.      Scheduled Meds: . benztropine  1 mg Oral Daily  . buPROPion  150 mg Oral Daily  . diclofenac Sodium  2 g Topical QID  . enoxaparin (LOVENOX) injection  40 mg Subcutaneous Q24H  .  ferrous sulfate  325 mg Oral Q breakfast  . gabapentin  300 mg Oral BID  . lidocaine  1 patch Transdermal Q24H  . metoprolol succinate  12.5 mg Oral Daily  . mirtazapine  15 mg Oral QHS  . nicotine  14 mg Transdermal Daily  . pantoprazole  40 mg Oral Daily  . saccharomyces boulardii  250 mg Oral BID  . senna-docusate  1 tablet Oral BID  . sodium chloride flush  10-40 mL Intracatheter Q12H  . sodium chloride flush  3 mL Intravenous Q12H  . vitamin B-12  100 mcg Oral Daily  . Vitamin D (Ergocalciferol)  50,000 Units Oral Q7 days   Continuous Infusions: . sodium chloride 250 mL (01/30/20 1407)  . ceFEPime (MAXIPIME) IV 2 g (02/12/20 0659)  . vancomycin 1,000 mg (02/12/20 0527)     LOS: 36 days    Time spent: 34 minutes spent on chart review, discussion with nursing staff, consultants, updating family and interview/physical exam; more  than 50% of that time was spent in counseling and/or coordination of care.    Alvira Philips [place], DO [facility] Available via Epic secure chat 7am-7pm After these hours, please refer to coverage provider listed on [email or web address] 02/12/2020, 12:03 PM

## 2020-02-12 NOTE — Plan of Care (Signed)
  Problem: Pain Managment: Goal: General experience of comfort will improve Outcome: Progressing   

## 2020-02-13 DIAGNOSIS — M4626 Osteomyelitis of vertebra, lumbar region: Secondary | ICD-10-CM | POA: Diagnosis not present

## 2020-02-13 LAB — BASIC METABOLIC PANEL
Anion gap: 7 (ref 5–15)
BUN: 8 mg/dL (ref 6–20)
CO2: 26 mmol/L (ref 22–32)
Calcium: 9.3 mg/dL (ref 8.9–10.3)
Chloride: 104 mmol/L (ref 98–111)
Creatinine, Ser: 0.93 mg/dL (ref 0.61–1.24)
GFR, Estimated: >60 mL/min (ref >60)
Glucose, Bld: 104 mg/dL — ABNORMAL HIGH (ref 70–99)
Potassium: 4 mmol/L (ref 3.5–5.1)
Sodium: 137 mmol/L (ref 135–145)

## 2020-02-13 LAB — SEDIMENTATION RATE: Sed Rate: 8 mm/hr (ref 0–16)

## 2020-02-13 LAB — VANCOMYCIN, TROUGH: Vancomycin Tr: 18 ug/mL (ref 15–20)

## 2020-02-13 LAB — C-REACTIVE PROTEIN: CRP: 1 mg/dL — ABNORMAL HIGH (ref <1.0)

## 2020-02-13 MED ORDER — SODIUM CHLORIDE 0.9% FLUSH
10.0000 mL | INTRAVENOUS | Status: DC | PRN
  Administered 2020-02-13: 10 mL

## 2020-02-13 NOTE — Progress Notes (Signed)
PROGRESS NOTE    [name]  ZOX:[id number] DOB: 11-26-1965 DOA: 01/06/2020 PCP: [name], MD    Brief Narrative:  [name] is a 54 year old man PMH discitis April 2021 status post laminectomy, history of schizophrenia, cocaine use presented with shoulder and back pain after mechanical fall.  Found to have isolated rib fracture, lumbar progressive discitis and osteomyelitis with septic arthritis at L3-4 and associated epidural phlegmon.  Patient evaluated by neurosurgery with recommendation for medical treatment.  Seen by infectious disease.  Continues on 6 weeks of IV antibiotics.  Also developed worsening shoulder pain, seen by orthopedics with conclusion chronic inflammation and recommendation for surgery after a year infection free.   Assessment & Plan:   Principal Problem:   Acute osteomyelitis of lumbar spine (HCC) Active Problems:   Cocaine abuse (HCC)   Benign essential HTN   History of CVA (cerebrovascular accident)   Chronic pain syndrome   Closed fracture of one rib of left side   Discitis of lumbar region   Ankle ankylosis, left   Recurrent discitis, osteomyelitis L3-4 with associated epidural phlegmon --Continue antibiotics x 6 weeks with Vanc/Cefepime, plan completion November 21. --Continue pain management --Repeat MRI lumbar spine just prior to completion of antibiotics [facility] Nov 17/18) per ID and then reconsult ID for further recs to ensure patient does not require extended antibiotics  Large glenohumeral joint effusion thought to be posttraumatic Evaluated by Dr. Otelia Sergeant of [facility] orthopedics ON 02/07/2020.  Likely inflammatory fusion right shoulder with avascular necrosis and severe osteoarthritis, not a candidate for surgical joint replacement at this time. --Continue PT/OT efforts --follow-up w/ orthopedics as an outpatient; no surgery before 1 year post-infeciton.  PMH cocaine use, alcohol use --Counseled earlier in  hospitalization  COPD --remains stable  Schizophrenia --On Invega every 3 months.  Continue Cogentin, Wellbutrin and Remeron.  Acute nondisplaced fracture left ninth rib --Supportive care   DVT prophylaxis: Lovenox, SCDs, ambulation Code Status: Full code Family Communication: None present at bedside this morning  Disposition Plan:  Status is: Inpatient  Remains inpatient appropriate because:Unsafe d/c plan, IV treatments appropriate due to intensity of illness or inability to take PO and Inpatient level of care appropriate due to severity of illness   Dispo: The patient is from: Home              Anticipated d/c is to: Home              Anticipated d/c date is: > 3 days              Patient currently is not medically stable to d/c.   Consultants:   Infectious disease  Neurosurgery  Orthopedics, Dr. Otelia Sergeant  Interventional radiology  Procedures:   Unsuccessful disc aspiration lumbar spine  Antimicrobials:   Vancomycin  Cefepime  Subjective: Patient seen and examined at bedside, sleeping but arousable.  Complaining of right shoulder pain, otherwise no complaints.  Denies headache, no fever/chills/night sweats, no nausea/vomiting/diarrhea, no chest pain, palpitations, no shortness of breath, no abdominal pain, no weakness, no fatigue, no paresthesia.  No acute events overnight per nursing staff.  Objective: Vitals:   02/12/20 1650 02/12/20 2131 02/13/20 0616 02/13/20 0935  BP: (!) 133/99 (!) 138/102 (!) 134/92 114/76  Pulse: 93 92 88 (!) 108  Resp: 20 18 18 20   Temp: 97.9 F (36.6 C) 98 F (36.7 C) 98 F (36.7 C) 98.4 F (36.9 C)  TempSrc: Oral Oral  Oral  SpO2: 100% 100% 100% 97%  Weight:      Height:        Intake/Output Summary (Last 24 hours) at 02/13/2020 1142 Last data filed at 02/13/2020 0600 Gross per 24 hour  Intake 920 ml  Output 600 ml  Net 320 ml   Filed Weights   02/08/20 2119 02/09/20 2140 02/11/20 0509  Weight: 92.1 kg 92.1 kg  93.1 kg    Examination:  General exam: Appears calm and comfortable  Respiratory system: Clear to auscultation. Respiratory effort normal. Cardiovascular system: S1 & S2 heard, RRR. No JVD, murmurs, rubs, gallops or clicks. No pedal edema. Gastrointestinal system: Abdomen is nondistended, soft and nontender. No organomegaly or masses felt. Normal bowel sounds heard. Central nervous system: Alert and oriented. No focal neurological deficits. Extremities: Symmetric 5 x 5 power. Skin: No rashes, lesions or ulcers Psychiatry: Judgement and insight appear normal. Mood & affect appropriate.     Data Reviewed: I have personally reviewed following labs and imaging studies  CBC: No results for input(s): WBC, NEUTROABS, HGB, HCT, MCV, PLT in the last 168 hours. Basic Metabolic Panel: Recent Labs  Lab 02/07/20 0459 02/09/20 0535 02/11/20 0533 02/13/20 0319  NA 137 137 137 137  K 3.7 3.8 3.6 4.0  CL 104 106 104 104  CO2 24 23 24 26   GLUCOSE 130* 114* 91 104*  BUN 10 11 10 8   CREATININE 1.15 1.06 1.11 0.93  CALCIUM 9.2 9.1 8.9 9.3   GFR: Estimated Creatinine Clearance: 102.4 mL/min (by C-G formula based on SCr of 0.93 mg/dL). Liver Function Tests: No results for input(s): AST, ALT, ALKPHOS, BILITOT, PROT, ALBUMIN in the last 168 hours. No results for input(s): LIPASE, AMYLASE in the last 168 hours. No results for input(s): AMMONIA in the last 168 hours. Coagulation Profile: No results for input(s): INR, PROTIME in the last 168 hours. Cardiac Enzymes: No results for input(s): CKTOTAL, CKMB, CKMBINDEX, TROPONINI in the last 168 hours. BNP (last 3 results) No results for input(s): PROBNP in the last 8760 hours. HbA1C: No results for input(s): HGBA1C in the last 72 hours. CBG: No results for input(s): GLUCAP in the last 168 hours. Lipid Profile: No results for input(s): CHOL, HDL, LDLCALC, TRIG, CHOLHDL, LDLDIRECT in the last 72 hours. Thyroid Function Tests: No results for  input(s): TSH, T4TOTAL, FREET4, T3FREE, THYROIDAB in the last 72 hours. Anemia Panel: No results for input(s): VITAMINB12, FOLATE, FERRITIN, TIBC, IRON, RETICCTPCT in the last 72 hours. Sepsis Labs: No results for input(s): PROCALCITON, LATICACIDVEN in the last 168 hours.  Recent Results (from the past 240 hour(s))  Aerobic/Anaerobic Culture (surgical/deep wound)     Status: None   Collection Time: 02/04/20 11:21 AM   Specimen: PATH Cytology Misc. fluid; Body Fluid  Result Value Ref Range Status   Specimen Description SYNOVIAL FLUID  Final   Special Requests RIGHT SHOULDER  Final   Gram Stain   Final    RARE WBC PRESENT, PREDOMINANTLY MONONUCLEAR NO ORGANISMS SEEN    Culture   Final    No growth aerobically or anaerobically. Performed at [facility], [address], [place], [place] [address]    Report Status 02/09/2020 FINAL  Final         Radiology Studies: [place] EKG SITE RITE  Result Date: 02/12/2020 If Site Rite image not attached, placement could not be confirmed due to current cardiac rhythm.       Scheduled Meds: . benztropine  1 mg Oral Daily  . buPROPion  150 mg Oral Daily  .  diclofenac Sodium  2 g Topical QID  . enoxaparin (LOVENOX) injection  40 mg Subcutaneous Q24H  . ferrous sulfate  325 mg Oral Q breakfast  . gabapentin  300 mg Oral BID  . lidocaine  1 patch Transdermal Q24H  . metoprolol succinate  12.5 mg Oral Daily  . mirtazapine  15 mg Oral QHS  . nicotine  14 mg Transdermal Daily  . pantoprazole  40 mg Oral Daily  . saccharomyces boulardii  250 mg Oral BID  . senna-docusate  1 tablet Oral BID  . sodium chloride flush  10-40 mL Intracatheter Q12H  . sodium chloride flush  3 mL Intravenous Q12H  . vitamin B-12  100 mcg Oral Daily  . Vitamin D (Ergocalciferol)  50,000 Units Oral Q7 days   Continuous Infusions: . sodium chloride 250 mL (01/30/20 1407)  . ceFEPime (MAXIPIME) IV 2 g (02/13/20 5284)  . vancomycin 1,000 mg (02/13/20 0400)      LOS: 37 days    Time spent: 34 minutes spent on chart review, discussion with nursing staff, consultants, updating family and interview/physical exam; more than 50% of that time was spent in counseling and/or coordination of care.    Alvira Philips [place], DO [facility] Available via Epic secure chat 7am-7pm After these hours, please refer to coverage provider listed on [email or web address] 02/13/2020, 11:42 AM

## 2020-02-13 NOTE — Progress Notes (Signed)
Pharmacy Antibiotic Note  [name] is a 54 y.o. male admitted on 01/06/2020 with prior history of lumbar osteomyelitis/discitis in May-June 2021, now with back pain post fall at home and MRI showing lumbar osteomyelitis. Neurosurgery was unable to get successful disc aspiration during biopsy. Patient with history of L ankle Enterobacter infection, S/P hardware removal in 2019; ortho saw patient here, possible chronic vs advancing osteo. Pharmacy has been consulted for vancomycin and cefepime dosing.  Plan is for 6 wks of IV abx (vancomycin/cefepime, thru 02/18/20), then consolidate to PO doxycycline and cephalexin for another month.   Scr stable, CrCl ~100 ml/min; Vancomycin trough 18 (goal 15-20)  Plan: Continue vancomycin 1 gm IV Q 12 hrs Continue cefepime 2 gm IV Q 8 hrs BMET Q 72 hrs while on vancomycin; next on 11/19 Anticipate no further Vancomycin levels as therapy planned to end 02/18/20.  Height: 5\' 9"  (175.3 cm) Weight: 93.1 kg (205 lb 4 oz) IBW/kg (Calculated) : 70.7  Temp (24hrs), Avg:98.1 F (36.7 C), Min:97.9 F (36.6 C), Max:98.4 F (36.9 C)  Recent Labs  Lab 02/07/20 0459 02/07/20 1513 02/09/20 0535 02/11/20 0533 02/13/20 0319  CREATININE 1.15  --  1.06 1.11 0.93  VANCOTROUGH  --  18  --   --  18    Estimated Creatinine Clearance: 102.4 mL/min (by C-G formula based on SCr of 0.93 mg/dL).    Allergies  Allergen Reactions  . Chlorhexidine Itching and Rash    Antimicrobials this admission: Ceftriaxone 10/11 >> 10/14 Vancomycin 10/11 >> (11/21) Cefepime 10/14 >> (11/21) Keflex/Doxy to begin 11/22 for a month >> (not entered yet)  Dose adjustments / vancomycin trough levels: 10/15 VT = 22 mcg/ml but dose prior PM dose was given ~4 hours late so true trough ~15 mcg/ml (therapeutic) on 1500 mg IV q12h> no change 10/21 VT = 23 mcg/ml , drawn ~50 min early, on 1500 mg IV q12h > decr to 1250 mg IV q12h 10/25 VT= 21 mcg/ml - on 1250 mg IV q12h > decr to 1gm  IV q12h 10/28 VT = 17 mcg/ml - continue 1 gm IV q12h 11/4 VT = 17 mg/L - continue vancomycin 1 gm IV q12h 11/10 VT = 18 mg/L - continue vancomycin 1 gm IV Q 12 hrs 11/16 VT = 18 mg/L - continue vancomycin 1 gm IV Q 12 hrs  Microbiology results: 10/11 fluid, disc L1 cx: NG/final 10/11 Blood x 2: NG/final 10/10 COVID, flu: negative 11/7 R shoulder synovial fluid: NOS on Gram stain, cx NG at 3 days  Thank you for allowing pharmacy to be a part of this patient's care.  [employer], Pharm.D., BCPS Clinical Pharmacist Clinical phone for 02/13/2020 from 8:30-4:00 is [phone].  **Pharmacist phone directory can be found on [email or web address] listed under [place].  02/13/2020 11:10 AM

## 2020-02-13 NOTE — Progress Notes (Signed)
Peripherally Inserted Central Catheter Placement  The IV Nurse has discussed with the patient and/or persons authorized to consent for the patient, the purpose of this procedure and the potential benefits and risks involved with this procedure.  The benefits include less needle sticks, lab draws from the catheter, and the patient may be discharged home with the catheter. Risks include, but not limited to, infection, bleeding, blood clot (thrombus formation), and puncture of an artery; nerve damage and irregular heartbeat and possibility to perform a PICC exchange if needed/ordered by physician.  Alternatives to this procedure were also discussed.  Bard Power PICC patient education guide, fact sheet on infection prevention and patient information card has been provided to patient /or left at bedside. PICC placed by Audie Box RN RN   PICC Placement Documentation  PICC Single Lumen 02/13/20 PICC Right Cephalic 42 cm 0 cm (Active)  Indication for Insertion or Continuance of Line Chronic illness with exacerbations (CF, Sickle Cell, etc.);Prolonged intravenous therapies 02/13/20 0250  Exposed Catheter (cm) 0 cm 02/13/20 0250  Site Assessment Clean;Dry;Intact 02/13/20 0250  Line Status Blood return noted;Flushed;Saline locked 02/13/20 0250  Dressing Type Transparent;Occlusive;Securing device 02/13/20 0250  Dressing Status Clean;Dry;Intact 02/13/20 0250  Antimicrobial disc in place? Yes 02/13/20 0250  Safety Lock Not Applicable 25/95/63 8756  Line Adjustment (NICU/IV Team Only) No 02/13/20 0250  Dressing Intervention New dressing 02/13/20 0250  Dressing Change Due 02/20/20 02/13/20 0250       Edson Snowball 02/13/2020, 2:53 AM

## 2020-02-14 ENCOUNTER — Inpatient Hospital Stay (HOSPITAL_COMMUNITY): Payer: Medicare Other

## 2020-02-14 DIAGNOSIS — M4626 Osteomyelitis of vertebra, lumbar region: Secondary | ICD-10-CM | POA: Diagnosis not present

## 2020-02-14 MED ORDER — GADOBUTROL 1 MMOL/ML IV SOLN
10.0000 mL | Freq: Once | INTRAVENOUS | Status: AC | PRN
  Administered 2020-02-14: 10 mL via INTRAVENOUS

## 2020-02-14 MED ORDER — LORAZEPAM 2 MG/ML IJ SOLN
1.0000 mg | INTRAMUSCULAR | Status: AC
  Administered 2020-02-14: 1 mg via INTRAVENOUS
  Filled 2020-02-14: qty 1

## 2020-02-14 NOTE — Progress Notes (Signed)
PROGRESS NOTE    [name]  ION:[id number] DOB: 10/11/1965 DOA: 01/06/2020 PCP: Brian Mylar, MD    Brief Narrative:  [name] is a 54 year old man PMH discitis April 2021 status post laminectomy, history of schizophrenia, cocaine use presented with shoulder and back pain after mechanical fall.  Found to have isolated rib fracture, lumbar progressive discitis and osteomyelitis with septic arthritis at L3-4 and associated epidural phlegmon.  Patient evaluated by neurosurgery with recommendation for medical treatment.  Seen by infectious disease.  Continues on 6 weeks of IV antibiotics.  Also developed worsening shoulder pain, seen by orthopedics with conclusion chronic inflammation and recommendation for surgery after a year infection free.   Assessment & Plan:   Principal Problem:   Acute osteomyelitis of lumbar spine (HCC) Active Problems:   Cocaine abuse (HCC)   Benign essential HTN   History of CVA (cerebrovascular accident)   Chronic pain syndrome   Closed fracture of one rib of left side   Discitis of lumbar region   Ankle ankylosis, left   Recurrent discitis, osteomyelitis L3-4 with associated epidural phlegmon --Continue antibiotics x 6 weeks with Vanc/Cefepime, plan completion November 21. --ESR 38>8>8 --CRP 2.1>0.7>0.6>1.0 --Continue pain management --Repeat MRI lumbar spine w/wo contrast plan for tomorrow Nov 18 per ID and then will reconsult ID for further recs to ensure patient does not require extended antibiotics  Large glenohumeral joint effusion thought to be posttraumatic Evaluated by Dr. Otelia [name] of [facility] orthopedics ON 02/07/2020.  Likely inflammatory fusion right shoulder with avascular necrosis and severe osteoarthritis, not a candidate for surgical joint replacement at this time. --Continue PT/OT efforts --follow-up w/ orthopedics as an outpatient; no surgery before 1 year post-infeciton.  PMH cocaine use, alcohol use --Counseled earlier in  hospitalization  COPD --remains stable  Schizophrenia --On Invega every 3 months.  Continue Cogentin, Wellbutrin and Remeron.  Acute nondisplaced fracture left ninth rib --Supportive care   DVT prophylaxis: Lovenox, SCDs, ambulation Code Status: Full code Family Communication: None present at bedside this morning  Disposition Plan:  Status is: Inpatient  Remains inpatient appropriate because:Unsafe d/c plan, IV treatments appropriate due to intensity of illness or inability to take PO and Inpatient level of care appropriate due to severity of illness   Dispo: The patient is from: Home              Anticipated d/c is to: Home              Anticipated d/c date is: > 3 days              Patient currently is not medically stable to d/c.   Consultants:   Infectious disease  Neurosurgery  Orthopedics, Dr. Otelia [name]  Interventional radiology  Procedures:   Unsuccessful disc aspiration lumbar spine  Antimicrobials:   Vancomycin  Cefepime  Subjective: Patient seen and examined at bedside, lying in bed.  No complaints this morning.  Discussed repeat MRI planned for tomorrow.  Denies headache, no fever/chills/night sweats, no nausea/vomiting/diarrhea, no chest pain, palpitations, no shortness of breath, no abdominal pain, no weakness, no fatigue, no paresthesia.  No acute events overnight per nursing staff.  Objective: Vitals:   02/13/20 2100 02/14/20 0525 02/14/20 0826 02/14/20 0900  BP: 138/88 140/78 114/88 107/79  Pulse: 100 100 88 (!) 102  Resp: 18 18 18 20   Temp: 98 F (36.7 C) 98 F (36.7 C) 98.4 F (36.9 C) 98 F (36.7 C)  TempSrc: Oral Oral Oral Oral  SpO2: 98%  98% 97% 100%  Weight:      Height:        Intake/Output Summary (Last 24 hours) at 02/14/2020 1046 Last data filed at 02/14/2020 0900 Gross per 24 hour  Intake 1320 ml  Output 900 ml  Net 420 ml   Filed Weights   02/08/20 2119 02/09/20 2140 02/11/20 0509  Weight: 92.1 kg 92.1 kg 93.1 kg      Examination:  General exam: Appears calm and comfortable  Respiratory system: Clear to auscultation. Respiratory effort normal. Cardiovascular system: S1 & S2 heard, RRR. No JVD, murmurs, rubs, gallops or clicks. No pedal edema. Gastrointestinal system: Abdomen is nondistended, soft and nontender. No organomegaly or masses felt. Normal bowel sounds heard. Central nervous system: Alert and oriented. No focal neurological deficits. Extremities: Symmetric 5 x 5 power. Skin: No rashes, lesions or ulcers Psychiatry: Judgement and insight appear normal. Mood & affect appropriate.     Data Reviewed: I have personally reviewed following labs and imaging studies  CBC: No results for input(s): WBC, NEUTROABS, HGB, HCT, MCV, PLT in the last 168 hours. Basic Metabolic Panel: Recent Labs  Lab 02/09/20 0535 02/11/20 0533 02/13/20 0319  NA 137 137 137  K 3.8 3.6 4.0  CL 106 104 104  CO2 23 24 26   GLUCOSE 114* 91 104*  BUN 11 10 8   CREATININE 1.06 1.11 0.93  CALCIUM 9.1 8.9 9.3   GFR: Estimated Creatinine Clearance: 102.4 mL/min (by C-G formula based on SCr of 0.93 mg/dL). Liver Function Tests: No results for input(s): AST, ALT, ALKPHOS, BILITOT, PROT, ALBUMIN in the last 168 hours. No results for input(s): LIPASE, AMYLASE in the last 168 hours. No results for input(s): AMMONIA in the last 168 hours. Coagulation Profile: No results for input(s): INR, PROTIME in the last 168 hours. Cardiac Enzymes: No results for input(s): CKTOTAL, CKMB, CKMBINDEX, TROPONINI in the last 168 hours. BNP (last 3 results) No results for input(s): PROBNP in the last 8760 hours. HbA1C: No results for input(s): HGBA1C in the last 72 hours. CBG: No results for input(s): GLUCAP in the last 168 hours. Lipid Profile: No results for input(s): CHOL, HDL, LDLCALC, TRIG, CHOLHDL, LDLDIRECT in the last 72 hours. Thyroid Function Tests: No results for input(s): TSH, T4TOTAL, FREET4, T3FREE, THYROIDAB in the  last 72 hours. Anemia Panel: No results for input(s): VITAMINB12, FOLATE, FERRITIN, TIBC, IRON, RETICCTPCT in the last 72 hours. Sepsis Labs: No results for input(s): PROCALCITON, LATICACIDVEN in the last 168 hours.  Recent Results (from the past 240 hour(s))  Aerobic/Anaerobic Culture (surgical/deep wound)     Status: None   Collection Time: 02/04/20 11:21 AM   Specimen: PATH Cytology Misc. fluid; Body Fluid  Result Value Ref Range Status   Specimen Description SYNOVIAL FLUID  Final   Special Requests RIGHT SHOULDER  Final   Gram Stain   Final    RARE WBC PRESENT, PREDOMINANTLY MONONUCLEAR NO ORGANISMS SEEN    Culture   Final    No growth aerobically or anaerobically. Performed at [facility], [address], [place], [place] [address]    Report Status 02/09/2020 FINAL  Final         Radiology Studies: [place] EKG SITE RITE  Result Date: 02/12/2020 If Site Rite image not attached, placement could not be confirmed due to current cardiac rhythm.       Scheduled Meds: . benztropine  1 mg Oral Daily  . buPROPion  150 mg Oral Daily  . diclofenac Sodium  2 g Topical QID  . enoxaparin (LOVENOX) injection  40 mg Subcutaneous Q24H  . ferrous sulfate  325 mg Oral Q breakfast  . gabapentin  300 mg Oral BID  . lidocaine  1 patch Transdermal Q24H  . LORazepam  1 mg Intravenous On Call  . metoprolol succinate  12.5 mg Oral Daily  . mirtazapine  15 mg Oral QHS  . nicotine  14 mg Transdermal Daily  . pantoprazole  40 mg Oral Daily  . saccharomyces boulardii  250 mg Oral BID  . senna-docusate  1 tablet Oral BID  . sodium chloride flush  10-40 mL Intracatheter Q12H  . sodium chloride flush  3 mL Intravenous Q12H  . vitamin B-12  100 mcg Oral Daily  . Vitamin D (Ergocalciferol)  50,000 Units Oral Q7 days   Continuous Infusions: . sodium chloride 250 mL (01/30/20 1407)  . ceFEPime (MAXIPIME) IV 2 g (02/14/20 0657)  . vancomycin 1,000 mg (02/14/20 0432)     LOS: 38 days     Time spent: 34 minutes spent on chart review, discussion with nursing staff, consultants, updating family and interview/physical exam; more than 50% of that time was spent in counseling and/or coordination of care.    Alvira Philips [place], DO [facility] Available via Epic secure chat 7am-7pm After these hours, please refer to coverage provider listed on [email or web address] 02/14/2020, 10:46 AM

## 2020-02-15 DIAGNOSIS — M4626 Osteomyelitis of vertebra, lumbar region: Secondary | ICD-10-CM | POA: Diagnosis not present

## 2020-02-15 LAB — BASIC METABOLIC PANEL
Anion gap: 7 (ref 5–15)
BUN: 10 mg/dL (ref 6–20)
CO2: 25 mmol/L (ref 22–32)
Calcium: 9.1 mg/dL (ref 8.9–10.3)
Chloride: 106 mmol/L (ref 98–111)
Creatinine, Ser: 1.22 mg/dL (ref 0.61–1.24)
GFR, Estimated: >60 mL/min (ref >60)
Glucose, Bld: 99 mg/dL (ref 70–99)
Potassium: 4 mmol/L (ref 3.5–5.1)
Sodium: 138 mmol/L (ref 135–145)

## 2020-02-15 MED ORDER — SODIUM CHLORIDE 0.9 % IV BOLUS
500.0000 mL | Freq: Once | INTRAVENOUS | Status: AC
  Administered 2020-02-15: 500 mL via INTRAVENOUS

## 2020-02-15 NOTE — Plan of Care (Signed)
  Problem: Pain Managment: Goal: General experience of comfort will improve Outcome: Progressing   

## 2020-02-15 NOTE — Progress Notes (Signed)
Occupational Therapy Treatment Patient Details Name: [name] MRN: [id number] DOB: Mar 25, 1966 Today's Date: 02/15/2020    History of present illness 54 y/o male with PMH of discitis April 2021, s/p laminectormy, schizophrenia, cocaine use, presenting with mechanical fall in the shower with shoulder and back pain. Imaging reveals: progresive discitits osteomyelitis of LS spine, Septic arthriits at L3-4, spinal stenosis L1-2 and improved discitis at L1-2.  Underwent apsiration/core biopsy 10/11, neurosurgery recommends medical tx. Acute non displaced Fx of anterolateral L 9th rib; R shoulder with no acute findings.  Complains of R shoulder pain, OT ordered again 11/10.  Found with moderatelysevere osteoarthritis right glenohumeral joint.    OT comments  Patient lethargic upon entry but awakens easily.  Completing R UE exercises, listed below, with min cueing for technique and full ROM on R shoulder.  Encouraged pt to complete 3x/day, as reports only completing once in the morning.  Progressed shoulder flexion with AAROM on 30* angled bed rail to progress towards wall slides as able to tolerate.  Continue to recommend OP OT.  Will follow.    Follow Up Recommendations  Outpatient OT;Supervision - Intermittent    Equipment Recommendations  3 in 1 bedside commode    Recommendations for Other Services      Precautions / Restrictions Precautions Precautions: None Restrictions Weight Bearing Restrictions: No       Mobility Bed Mobility Overal bed mobility: Independent                Transfers Overall transfer level: Independent                    Balance Overall balance assessment: Needs assistance Sitting-balance support: Feet supported Sitting balance-[name]: Good     Standing balance support: No upper extremity supported;During functional activity Standing balance-[name]: Good                             ADL either performed or  assessed with clinical judgement   ADL Overall ADL's : Needs assistance/impaired                     Lower Body Dressing: Modified independent;Sit to/from stand Lower Body Dressing Details (indicate cue type and reason): donning socks, sit to stand independent              Functional mobility during ADLs: Independent General ADL Comments: focus on R shoulder exercises      Vision   Vision Assessment?: No apparent visual deficits   Perception     Praxis      Cognition Arousal/Alertness: Awake/alert Behavior During Therapy: WFL for tasks assessed/performed Overall Cognitive Status: Within Functional Limits for tasks assessed                                          Exercises Exercises: Other exercises Other Exercises Other Exercises: completed HEP for R shoulder including shoulder elevation, shoulder retraction, lap/table slides  [id number] Medbridge; requires min cueing for technique and full ROM on R side; 2 sets x 10 reps   Other Exercises: completed angled slides on 30* upper bed rail x 10 reps 2 sets to progress shoulder flexion with AAROM    Shoulder Instructions       General Comments reviewed exercises from HEP listed below, encouraged completion 3x/day (as pt reports  only completing 1x/day)    Pertinent Vitals/ Pain       Pain Assessment: Faces Faces Pain Scale: Hurts a little bit Pain Location: shoulder with movement  Pain Descriptors / Indicators: Guarding;Discomfort Pain Intervention(s): Limited activity within patient's tolerance;Monitored during session;Repositioned  Home Living                                          Prior Functioning/Environment              Frequency  Min 2X/week        Progress Toward Goals  OT Goals(current goals can now be found in the care plan section)  Progress towards OT goals: Progressing toward goals  Acute Rehab OT Goals Patient Stated Goal: less pain OT Goal  Formulation: With patient  Plan Discharge plan remains appropriate;Frequency remains appropriate    Co-evaluation                 AM-PAC OT "6 Clicks" Daily Activity     Outcome Measure   Help from another person eating meals?: None Help from another person taking care of personal grooming?: None Help from another person toileting, which includes using toliet, bedpan, or urinal?: None Help from another person bathing (including washing, rinsing, drying)?: None Help from another person to put on and taking off regular upper body clothing?: None Help from another person to put on and taking off regular lower body clothing?: None 6 Click Score: 24    End of Session    OT Visit Diagnosis: Pain Pain - Right/Left: Right Pain - part of body: Shoulder   Activity Tolerance Patient tolerated treatment well   Patient Left in bed;with call bell/phone within reach   Nurse Communication Mobility status        Time: 1002-1020 OT Time Calculation (min): 18 min  Charges: OT General Charges $OT Visit: 1 Visit OT Treatments $Therapeutic Exercise: 8-22 mins  Barry Brunner, OT Acute Rehabilitation Services Pager [phone] Office [phone]    Chancy Milroy 02/15/2020, 10:39 AM

## 2020-02-15 NOTE — Progress Notes (Signed)
PROGRESS NOTE    [name]  JXB:[id number] DOB: 07/18/1965 DOA: 01/06/2020 PCP: [name], MD    Brief Narrative:  [name] is a 54 year old man PMH discitis April 2021 status post laminectomy, history of schizophrenia, cocaine use presented with shoulder and back pain after mechanical fall.  Found to have isolated rib fracture, lumbar progressive discitis and osteomyelitis with septic arthritis at L3-4 and associated epidural phlegmon.  Patient evaluated by neurosurgery with recommendation for medical treatment.  Seen by infectious disease.  Continues on 6 weeks of IV antibiotics.  Also developed worsening shoulder pain, seen by orthopedics with conclusion chronic inflammation and recommendation for surgery after a year infection free.   Assessment & Plan:   Principal Problem:   Acute osteomyelitis of lumbar spine (HCC) Active Problems:   Cocaine abuse (HCC)   Benign essential HTN   History of CVA (cerebrovascular accident)   Chronic pain syndrome   Closed fracture of one rib of left side   Discitis of lumbar region   Ankle ankylosis, left   Recurrent discitis, osteomyelitis L3-4 with associated epidural phlegmon --Continue antibiotics x 6 weeks with Vanc/Cefepime, plan completion November 21. --ESR 38>8>8 --CRP 2.1>0.7>0.6>1.0 --Continue pain management --Repeat MRI lumbar spine w/wo contrast 02/14/2020 with interval improvement paraspinal inflammatory changes with no residual epidural fluid collection appreciated. --Consulted ID, Dr. Elinor Parkinson to update recommendations, she states will place note  Large glenohumeral joint effusion thought to be posttraumatic Evaluated by Dr. Otelia Sergeant of [facility] orthopedics ON 02/07/2020.  Likely inflammatory fusion right shoulder with avascular necrosis and severe osteoarthritis, not a candidate for surgical joint replacement at this time. --Continue PT/OT efforts --follow-up w/ orthopedics as an outpatient; no surgery before 1  year post-infeciton.  PMH cocaine use, alcohol use --Counseled earlier in hospitalization  COPD --remains stable  Schizophrenia --On Invega every 3 months.  Continue Cogentin, Wellbutrin and Remeron.  Acute nondisplaced fracture left ninth rib --Supportive care   DVT prophylaxis: Lovenox, SCDs, ambulation Code Status: Full code Family Communication: None present at bedside this morning  Disposition Plan:  Status is: Inpatient  Remains inpatient appropriate because:Unsafe d/c plan, IV treatments appropriate due to intensity of illness or inability to take PO and Inpatient level of care appropriate due to severity of illness   Dispo: The patient is from: Home              Anticipated d/c is to: Home              Anticipated d/c date is: > 3 days              Patient currently is not medically stable to d/c.   Consultants:   Infectious disease  Neurosurgery  Orthopedics, Dr. Otelia Sergeant  Interventional radiology  Procedures:   Unsuccessful disc aspiration lumbar spine  Antimicrobials:   Vancomycin  Cefepime  Subjective: Patient seen and examined at bedside, lying in bed.  No complaints this morning.  Discussed MRI findings with patient this morning with no residual epidural fluid collection.  Reconsulted ID for any further recommendations.  Denies headache, no fever/chills/night sweats, no nausea/vomiting/diarrhea, no chest pain, palpitations, no shortness of breath, no abdominal pain, no weakness, no fatigue, no paresthesia.  No acute events overnight per nursing staff.  Objective: Vitals:   02/14/20 1734 02/14/20 2138 02/15/20 0452 02/15/20 0916  BP: (!) 131/92 127/87 115/81 129/85  Pulse: 94 98 92 (!) 103  Resp: 18 18 18    Temp: 98 F (36.7 C) 98.2 F (36.8  C) 97.9 F (36.6 C) 98.2 F (36.8 C)  TempSrc: Oral Oral Oral Oral  SpO2: 100% 97% 99% 99%  Weight:  94 kg    Height:        Intake/Output Summary (Last 24 hours) at 02/15/2020 1207 Last data  filed at 02/15/2020 0900 Gross per 24 hour  Intake 305.06 ml  Output 800 ml  Net -494.94 ml   Filed Weights   02/09/20 2140 02/11/20 0509 02/14/20 2138  Weight: 92.1 kg 93.1 kg 94 kg    Examination:  General exam: Appears calm and comfortable  Respiratory system: Clear to auscultation. Respiratory effort normal. Cardiovascular system: S1 & S2 heard, RRR. No JVD, murmurs, rubs, gallops or clicks. No pedal edema. Gastrointestinal system: Abdomen is nondistended, soft and nontender. No organomegaly or masses felt. Normal bowel sounds heard. Central nervous system: Alert and oriented. No focal neurological deficits. Extremities: Symmetric 5 x 5 power. Skin: No rashes, lesions or ulcers Psychiatry: Judgement and insight appear normal. Mood & affect appropriate.     Data Reviewed: I have personally reviewed following labs and imaging studies  CBC: No results for input(s): WBC, NEUTROABS, HGB, HCT, MCV, PLT in the last 168 hours. Basic Metabolic Panel: Recent Labs  Lab 02/09/20 0535 02/11/20 0533 02/13/20 0319 02/15/20 0405  NA 137 137 137 138  K 3.8 3.6 4.0 4.0  CL 106 104 104 106  CO2 23 24 26 25   GLUCOSE 114* 91 104* 99  BUN 11 10 8 10   CREATININE 1.06 1.11 0.93 1.22  CALCIUM 9.1 8.9 9.3 9.1   GFR: Estimated Creatinine Clearance: 78.3 mL/min (by C-G formula based on SCr of 1.22 mg/dL). Liver Function Tests: No results for input(s): AST, ALT, ALKPHOS, BILITOT, PROT, ALBUMIN in the last 168 hours. No results for input(s): LIPASE, AMYLASE in the last 168 hours. No results for input(s): AMMONIA in the last 168 hours. Coagulation Profile: No results for input(s): INR, PROTIME in the last 168 hours. Cardiac Enzymes: No results for input(s): CKTOTAL, CKMB, CKMBINDEX, TROPONINI in the last 168 hours. BNP (last 3 results) No results for input(s): PROBNP in the last 8760 hours. HbA1C: No results for input(s): HGBA1C in the last 72 hours. CBG: No results for input(s):  GLUCAP in the last 168 hours. Lipid Profile: No results for input(s): CHOL, HDL, LDLCALC, TRIG, CHOLHDL, LDLDIRECT in the last 72 hours. Thyroid Function Tests: No results for input(s): TSH, T4TOTAL, FREET4, T3FREE, THYROIDAB in the last 72 hours. Anemia Panel: No results for input(s): VITAMINB12, FOLATE, FERRITIN, TIBC, IRON, RETICCTPCT in the last 72 hours. Sepsis Labs: No results for input(s): PROCALCITON, LATICACIDVEN in the last 168 hours.  No results found for this or any previous visit (from the past 240 hour(s)).       Radiology Studies: MR Lumbar Spine W Wo Contrast  Result Date: 02/14/2020 CLINICAL DATA:  Follow up epidural abscess. History of discitis following laminectomy in April 2021. EXAM: MRI LUMBAR SPINE WITHOUT AND WITH CONTRAST TECHNIQUE: Multiplanar and multiecho pulse sequences of the lumbar spine were obtained without and with intravenous contrast. CONTRAST:  10mL GADAVIST GADOBUTROL 1 MMOL/ML IV SOLN COMPARISON:  Lumbar MRI 01/07/2020 and 08/04/2019 FINDINGS: Segmentation: Conventional anatomy assumed, with the last open disc space designated L5-S1.Concordant with previous imaging. Alignment: Stable. Mild kyphotic angulation at L1-2 and grade 1 retrolisthesis at L1-2 and L2-3 unchanged. Stable chronic grade 1 anterolisthesis at L4-5. Vertebrae: Stable postsurgical changes status post laminectomy and PLIF at L4-5. Chronic sequela of discitis and osteomyelitis at  L1-2 with endplate irregularity, but no progressive endplate destruction, residual significant endplate edema or abnormal enhancement. Interval improved marrow edema and enhancement associated with the right L3-4 facet joint. No evidence of progressive osteomyelitis or acute osseous findings. The visualized sacroiliac joints appear unremarkable. Conus medullaris: Extends to the L1 level and appears normal. Previously demonstrated diffuse dural thickening and enhancement extending from L1 through L5 has improved.  There is no residual epidural fluid collection. Paraspinal and other soft tissues: Improved paraspinal inflammatory changes without focal fluid collection. Disc levels: No significant disc space findings from T9-10 through T11-12. T12-L1: Stable mild disc bulging and facet hypertrophy. No significant spinal stenosis. L1-2: Chronic sequela discitis and osteomyelitis with loss of disc height, endplate irregularity and posterior osteophytes. There is annular disc bulging and mild bilateral facet hypertrophy. These factors contribute to mild spinal stenosis and moderate narrowing of the lateral recesses and foramina bilaterally. No significant residual epidural fluid collection. L2-3: Relatively preserved disc height with annular disc bulging eccentric to the right and mild bilateral facet hypertrophy. Mild spinal stenosis and mild right foraminal narrowing. No residual epidural fluid collection. L3-4: Stable disc bulging eccentric to the right, facet and ligamentous hypertrophy. Stable moderate spinal stenosis and mild narrowing of the lateral recesses and foramina. No residual epidural fluid collection. L4-5: Status post laminectomy and PLIF. The spinal canal remains adequately decompressed. Disc bulging and endplate osteophytes contribute to mild foraminal narrowing bilaterally. No residual epidural fluid collection. L5-S1: The disc appears normal. Mild bilateral facet hypertrophy. No spinal stenosis. IMPRESSION: 1. Interval improvement in paraspinal inflammatory changes, dural thickening and enhancement. No residual epidural fluid collection. 2. Chronic sequela of discitis and osteomyelitis at L1-2 without progressive endplate destruction, residual endplate edema or abnormal enhancement. 3. Overall improved patency the thecal sac. The greatest remaining spinal stenosis is at L3-4 and is moderate. 4. No acute findings. Electronically Signed   By: Carey Bullocks M.D.   On: 02/14/2020 16:50        Scheduled  Meds: . benztropine  1 mg Oral Daily  . buPROPion  150 mg Oral Daily  . diclofenac Sodium  2 g Topical QID  . enoxaparin (LOVENOX) injection  40 mg Subcutaneous Q24H  . ferrous sulfate  325 mg Oral Q breakfast  . gabapentin  300 mg Oral BID  . lidocaine  1 patch Transdermal Q24H  . metoprolol succinate  12.5 mg Oral Daily  . mirtazapine  15 mg Oral QHS  . nicotine  14 mg Transdermal Daily  . pantoprazole  40 mg Oral Daily  . saccharomyces boulardii  250 mg Oral BID  . senna-docusate  1 tablet Oral BID  . sodium chloride flush  10-40 mL Intracatheter Q12H  . sodium chloride flush  3 mL Intravenous Q12H  . vitamin B-12  100 mcg Oral Daily  . Vitamin D (Ergocalciferol)  50,000 Units Oral Q7 days   Continuous Infusions: . sodium chloride 250 mL (01/30/20 1407)  . ceFEPime (MAXIPIME) IV 2 g (02/15/20 1478)  . vancomycin 1,000 mg (02/15/20 0450)     LOS: 39 days    Time spent: 34 minutes spent on chart review, discussion with nursing staff, consultants, updating family and interview/physical exam; more than 50% of that time was spent in counseling and/or coordination of care.    Alvira Philips [place], DO [facility] Available via Epic secure chat 7am-7pm After these hours, please refer to coverage provider listed on [email or web address] 02/15/2020, 12:07 PM

## 2020-02-15 NOTE — Plan of Care (Signed)
  Problem: Health Behavior/Discharge Planning: Goal: Ability to manage health-related needs will improve Outcome: Progressing   Problem: Clinical Measurements: Goal: Will remain free from infection Outcome: Progressing   Problem: Pain Managment: Goal: General experience of comfort will improve Outcome: Progressing   

## 2020-02-16 LAB — CREATININE, SERUM
Creatinine, Ser: 0.98 mg/dL (ref 0.61–1.24)
GFR, Estimated: >60 mL/min (ref >60)

## 2020-02-16 MED ORDER — CIPROFLOXACIN HCL 500 MG PO TABS
750.0000 mg | ORAL_TABLET | Freq: Two times a day (BID) | ORAL | Status: DC
  Administered 2020-02-19 – 2020-02-20 (×3): 750 mg via ORAL
  Filled 2020-02-16 (×3): qty 1

## 2020-02-16 MED ORDER — ORITAVANCIN DIPHOSPHATE 400 MG IV SOLR
1200.0000 mg | Freq: Once | INTRAVENOUS | Status: AC
  Administered 2020-02-19: 1200 mg via INTRAVENOUS
  Filled 2020-02-16: qty 120

## 2020-02-16 NOTE — Progress Notes (Signed)
ID Brief Note   MRI Lumbar spine [facility] 02/15/20 reviewed  Patient is afebrile, no leukocytosis, hemodynamically stable  ESR and CRP has normalised  Recommend to continue previous stated course of abx. Todays is 36/42 days of IV abx. Please give him a dose of Dalbavancin after he completes the above treatment when he gets discharged along with 2 weeks worth of ciprofloxacin to complete 8 weeks of treatment given hg/o recurrence.    A follow up appointment with myself has been made at [facility] clinic on Dec 2 at 1:45 pm.   Please call us back with questions/concerns  Rosiland Oz, MD [place]

## 2020-02-16 NOTE — Progress Notes (Signed)
PROGRESS NOTE    [name]  ZDG:[id number] DOB: 1965/10/01 DOA: 01/06/2020  PCP: [name], MD    Brief Narrative:  [name] is a 54 year old man PMH discitis April 2021 status post laminectomy, history of schizophrenia, cocaine use presented with shoulder and back pain after mechanical fall. Found to have isolated rib fracture, lumbar progressive discitis and osteomyelitis with septic arthritis at L3-4 and associated epidural phlegmon. Patient evaluated by neurosurgery with recommendation for medical treatment. Seen by infectious disease. Continues on 6 weeks of IV antibiotics. Also developed worsening shoulder pain, seen by orthopedics with conclusion chronic inflammation and recommendation for surgery after a year infection free.   Assessment & Plan: Recurrent discitis, osteomyelitis L3-4 with associated epidural phlegmon --Continue antibiotics x 6 weeks with Vanc/Cefepime, plan completion November 21. --ESR 38>8>8 --CRP 2.1>0.7>0.6>1.0 --Continue pain management --Repeat MRI lumbar spine w/wo contrast 02/14/2020 with interval improvement paraspinal inflammatory changes with no residual epidural fluid collection appreciated. --Consulted ID, Dr. Elinor Parkinson to update recommendations, she states will place note  Large glenohumeral joint effusion thought to be posttraumatic Evaluated by Dr. Otelia Sergeant of [facility] orthopedics ON 02/07/2020.  Likely inflammatory fusion right shoulder with avascular necrosis and severe osteoarthritis, not a candidate for surgical joint replacement at this time. --Continue PT/OT efforts --follow-up w/ orthopedics as an outpatient; no surgery before 1 year post-infeciton.  PMH cocaine use, alcohol use --Counseled earlier in hospitalization  COPD --remains stable  Schizophrenia --On Invega every 3 months. Continue Cogentin, Wellbutrin and Remeron.  Acute nondisplaced fracture left ninth rib --Supportive care   DVT prophylaxis:  Lovenox, SCDs, ambulation Code Status: Full code Family Communication: None present at bedside this morning  Disposition Plan:  Status is: Inpatient  Remains inpatient appropriate because:Unsafe d/c plan, IV treatments appropriate due to intensity of illness or inability to take PO and Inpatient level of care appropriate due to severity of illness   Dispo: The patient is from: Home  Anticipated d/c is to: Home  Anticipated d/c date is: > 3 days  Patient currently is not medically stable to d/c.   Consultants:   Infectious disease  Neurosurgery  Orthopedics, Dr. Otelia Sergeant  Interventional radiology  Procedures:   Unsuccessful disc aspiration lumbar spine    Subjective/Overview: Patient seen and examined at bedside, lying in bed.  No complaints this morning.  Discussed MRI findings with patient this morning with no residual epidural fluid collection.  Reconsulted ID for any further recommendations.  Denies headache, no fever/chills/night sweats, no nausea/vomiting/diarrhea, no chest pain, palpitations, no shortness of breath, no abdominal pain, no weakness, no fatigue, no paresthesia.  No acute events overnight per nursing staff.  02/16/2020 Pt seen today for f/u and he is resting comfortably and reports back pain.  He is keen to go home. Vitals are stable and pt is afebrile. Labs are stable. Plan is to cont with iv abx per ID plan and d/c as below: Todays is 36/42 days of IV abx. Please give him a dose of Dalbavancin after he completes the above treatment when he gets discharged along with 2 weeks worth of ciprofloxacin to complete 8 weeks of treatment given h/o recurrence.  F/u with IC on December 2,2021 at 1:45 pm.   Objective: Vitals:   02/15/20 2115 02/16/20 0521 02/16/20 0925 02/16/20 1706  BP: (!) 134/93 110/88 (!) 121/95 (!) 141/100  Pulse: 100 84 (!) 101 93  Resp: 18 18 18 18   Temp: 98.2 F (36.8 C) 98.4 F (36.9 C) 97.7  F (36.5  C) 98 F (36.7 C)  TempSrc: Oral  Oral Oral  SpO2: 95% 97% 99% 99%  Weight: 94 kg     Height:       SpO2: 99 % O2 Flow Rate (L/min): 2 L/min  Intake/Output Summary (Last 24 hours) at 02/16/2020 1930 Last data filed at 02/16/2020 1700 Gross per 24 hour  Intake 2122.21 ml  Output 500 ml  Net 1622.21 ml   Filed Weights   02/11/20 0509 02/14/20 2138 02/15/20 2115  Weight: 93.1 kg 94 kg 94 kg    Examination: Blood pressure (!) 141/100, pulse 93, temperature 98 F (36.7 C), temperature source Oral, resp. rate 18, height 5\' 9"  (1.753 m), weight 94 kg, SpO2 99 %. General exam: Appears calm and comfortable  HEENT:EOMI, perrl  Respiratory system: Clear to auscultation. Respiratory effort normal. Cardiovascular system: S1 & S2 heard, RRR. No JVD, murmurs, rubs, gallops or clicks. No pedal edema. Gastrointestinal system: Abdomen is nondistended, soft and nontender. No organomegaly or masses felt. Normal bowel sounds heard. Central nervous system: Alert and oriented.CN grossly intact No focal neurological deficits. Extremities: pt moving all 4 ext and ambulating. Skin: No rashes, lesions or ulcers Psychiatry: Judgement and insight appear normal. Mood & affect appropriate.     Data Reviewed: I have personally reviewed following labs and imaging studies  Labs  No results for input(s): CKTOTAL, CKMB, TROPONINI in the last 72 hours. Lab Results  Component Value Date   WBC 8.9 02/06/2020   HGB 11.6 (L) 02/06/2020   HCT 37.1 (L) 02/06/2020   MCV 86.9 02/06/2020   PLT 264 02/06/2020    Recent Labs  Lab 02/15/20 0405 02/15/20 0405 02/16/20 0327  NA 138  --   --   K 4.0  --   --   CL 106  --   --   CO2 25  --   --   BUN 10  --   --   CREATININE 1.22   < > 0.98  CALCIUM 9.1  --   --   GLUCOSE 99  --   --    < > = values in this interval not displayed.   Lab Results  Component Value Date   CHOL 171 03/02/2014   HDL 34 (L) 03/02/2014   LDLCALC 95 03/02/2014    TRIG 212 (H) 03/02/2014   No results found for: DDIMER Invalid input(s): POCBNP  echocardiogram   Radiology Studies: No results found.      Current Facility-Administered Medications (Cardiovascular):  .  metoprolol succinate (TOPROL-XL) 24 hr tablet 12.5 mg     Current Facility-Administered Medications (Analgesics):  .  morphine (MSIR) tablet 30 mg   Current Facility-Administered Medications (Hematological):  .  enoxaparin (LOVENOX) injection 40 mg .  ferrous sulfate tablet 325 mg .  vitamin B-12 (CYANOCOBALAMIN) tablet 100 mcg   Current Facility-Administered Medications (Other):  .  0.9 %  sodium chloride infusion .  alum & mag hydroxide-simeth (MAALOX/MYLANTA) 200-200-20 MG/5ML suspension 15 mL .  benztropine (COGENTIN) tablet 1 mg .  buPROPion (WELLBUTRIN XL) 24 hr tablet 150 mg .  ceFEPIme (MAXIPIME) 2 g in sodium chloride 0.9 % 100 mL IVPB .  [START ON 02/19/2020] ciprofloxacin (CIPRO) tablet 750 mg .  cyclobenzaprine (FLEXERIL) tablet 5 mg .  diclofenac Sodium (VOLTAREN) 1 % topical gel 2 g .  gabapentin (NEURONTIN) capsule 300 mg .  lidocaine (LIDODERM) 5 % 1 patch .  lip balm (CARMEX) ointment .  melatonin tablet 5 mg .  mirtazapine (REMERON) tablet 15 mg .  nicotine (NICODERM CQ - dosed in mg/24 hours) patch 14 mg .  ondansetron (ZOFRAN) injection 4 mg .  [START ON 02/19/2020] Oritavancin Diphosphate (ORBACTIV) 1,200 mg in dextrose 5 % IVPB .  pantoprazole (PROTONIX) EC tablet 40 mg .  saccharomyces boulardii (FLORASTOR) capsule 250 mg .  senna-docusate (Senokot-S) tablet 1 tablet .  sodium chloride flush (NS) 0.9 % injection 10-40 mL .  sodium chloride flush (NS) 0.9 % injection 10-40 mL .  sodium chloride flush (NS) 0.9 % injection 10-40 mL .  sodium chloride flush (NS) 0.9 % injection 3 mL .  sodium chloride flush (NS) 0.9 % injection 3 mL .  vancomycin (VANCOCIN) IVPB 1000 mg/200 mL premix .  Vitamin D (Ergocalciferol) (DRISDOL) capsule 50,000  Units  No current outpatient medications on file.  Anti-infectives (From admission, onward)   Start     Dose/Rate Route Frequency Ordered Stop   02/19/20 1200  Oritavancin Diphosphate (ORBACTIV) 1,200 mg in dextrose 5 % IVPB        1,200 mg 333.3 mL/hr over 180 Minutes Intravenous Once 02/16/20 1233     02/19/20 0800  ciprofloxacin (CIPRO) tablet 750 mg        750 mg Oral 2 times daily 02/16/20 1233 03/04/20 0759   01/22/20 1430  vancomycin (VANCOCIN) IVPB 1000 mg/200 mL premix        1,000 mg 200 mL/hr over 60 Minutes Intravenous Every 12 hours 01/22/20 1349 02/18/20 2359   01/18/20 1200  vancomycin (VANCOREADY) IVPB 1250 mg/250 mL  Status:  Discontinued        1,250 mg 166.7 mL/hr over 90 Minutes Intravenous Every 12 hours 01/18/20 1125 01/22/20 1349   01/11/20 1400  ceFEPIme (MAXIPIME) 2 g in sodium chloride 0.9 % 100 mL IVPB        2 g 200 mL/hr over 30 Minutes Intravenous Every 8 hours 01/11/20 1013 02/18/20 2359   01/09/20 0400  vancomycin (VANCOREADY) IVPB 1500 mg/300 mL  Status:  Discontinued        1,500 mg 150 mL/hr over 120 Minutes Intravenous Every 12 hours 01/08/20 1900 01/18/20 1125   01/08/20 1900  vancomycin (VANCOREADY) IVPB 1750 mg/350 mL        1,750 mg 175 mL/hr over 120 Minutes Intravenous  Once 01/08/20 1900 01/08/20 2240   01/08/20 1818  cefTRIAXone (ROCEPHIN) 2 g in sodium chloride 0.9 % 100 mL IVPB  Status:  Discontinued        2 g 200 mL/hr over 30 Minutes Intravenous Every 24 hours 01/08/20 1813 01/11/20 1011       Continuous Infusions: . sodium chloride 250 mL (01/30/20 1407)  . ceFEPime (MAXIPIME) IV 2 g (02/16/20 1324)  . [START ON 02/19/2020] oritavancin (ORBACTIV) IVPB    . vancomycin 1,000 mg (02/16/20 1512)     LOS: 40 days    Gertha Calkin, MD [facility] Pager [phone] How to contact the [facility] Attending or Consulting provider 7A - 7P or covering provider during after hours 7P -7A, for this patient?    1. Check the care team  in [facility] and look for a) attending/consulting TRH provider listed and b) the [facility] team listed 2. Log into [email or web address] and use [place]'s universal password to access. If you do not have the password, please contact the hospital operator. 3. Locate the [facility] provider you are looking for under [facility] and page to a number that you can be directly reached. 4.  If you still have difficulty reaching the provider, please page the [facility] (Director on Call) for the Hospitalists listed on amion for assistance. [email or web address] Password [email or web address] 02/16/2020, 7:30 PM

## 2020-02-16 NOTE — Progress Notes (Addendum)
Pharmacy Antibiotic Note  [name] is a 54 y.o. male admitted on 01/06/2020 with prior history of lumbar osteomyelitis/discitis in May-June 2021, now with back pain post fall at home and MRI showing lumbar osteomyelitis. Neurosurgery was unable to get successful disc aspiration during biopsy. Patient with history of L ankle Enterobacter infection, S/P hardware removal in 2019; ortho saw patient here, possible chronic vs advancing osteo. Pharmacy has been consulted for vancomycin and cefepime dosing.  Plan is for 6 wks of IV abx (vancomycin/cefepime, thru 02/18/20), then consolidate oritavancin x1 + cipro x 2 wks per ID. We will not get any further vanc levels. Ok to substitute dalbavancin with oritavancin per Dr. [place] Bali.   Scr stable, CrCl ~100 ml/min; Vancomycin trough 18 (goal 15-20)  Plan: Continue vancomycin 1 gm IV Q 12 hrs>11/21 Continue cefepime 2 gm IV Q 8 hrs>>11/21 Oritavancin x1 on 11/22 Cipro 500mg  BID x 2 wks on 11/22  Height: 5\' 9"  (175.3 cm) Weight: 94 kg (207 lb 4 oz) IBW/kg (Calculated) : 70.7  Temp (24hrs), Avg:98.3 F (36.8 C), Min:97.7 F (36.5 C), Max:98.7 F (37.1 C)  Recent Labs  Lab 02/11/20 0533 02/13/20 0319 02/15/20 0405 02/16/20 0327  CREATININE 1.11 0.93 1.22 0.98  VANCOTROUGH  --  18  --   --     Estimated Creatinine Clearance: 97.5 mL/min (by C-G formula based on SCr of 0.98 mg/dL).    Allergies  Allergen Reactions  . Chlorhexidine Itching and Rash    Antimicrobials this admission: Ceftriaxone 10/11 >> 10/14 Vancomycin 10/11 >> (11/21) Cefepime 10/14 >> (11/21)   Dose adjustments / vancomycin trough levels: 10/15 VT = 22 mcg/ml but dose prior PM dose was given ~4 hours late so true trough ~15 mcg/ml (therapeutic) on 1500 mg IV q12h> no change 10/21 VT = 23 mcg/ml , drawn ~50 min early, on 1500 mg IV q12h > decr to 1250 mg IV q12h 10/25 VT= 21 mcg/ml - on 1250 mg IV q12h > decr to 1gm IV q12h 10/28 VT = 17 mcg/ml - continue 1 gm  IV q12h 11/4 VT = 17 mg/L - continue vancomycin 1 gm IV q12h 11/10 VT = 18 mg/L - continue vancomycin 1 gm IV Q 12 hrs 11/16 VT = 18 mg/L - continue vancomycin 1 gm IV Q 12 hrs  Microbiology results: 10/11 fluid, disc L1 cx: NG/final 10/11 Blood x 2: NG/final 10/10 COVID, flu: negative 11/7 R shoulder synovial fluid: NOS on Gram stain, cx NG at 3 days  Onnie Boer, PharmD, BCIDP, AAHIVP, CPP Infectious Disease Pharmacist 02/16/2020 11:13 AM

## 2020-02-16 NOTE — Plan of Care (Signed)
  Problem: Health Behavior/Discharge Planning: Goal: Ability to manage health-related needs will improve Outcome: Completed/Met

## 2020-02-17 LAB — BASIC METABOLIC PANEL
Anion gap: 10 (ref 5–15)
BUN: 9 mg/dL (ref 6–20)
CO2: 25 mmol/L (ref 22–32)
Calcium: 9.1 mg/dL (ref 8.9–10.3)
Chloride: 100 mmol/L (ref 98–111)
Creatinine, Ser: 1.04 mg/dL (ref 0.61–1.24)
GFR, Estimated: >60 mL/min (ref >60)
Glucose, Bld: 104 mg/dL — ABNORMAL HIGH (ref 70–99)
Potassium: 4 mmol/L (ref 3.5–5.1)
Sodium: 135 mmol/L (ref 135–145)

## 2020-02-17 NOTE — Plan of Care (Signed)
  Problem: Pain Managment: Goal: General experience of comfort will improve Outcome: Progressing   

## 2020-02-17 NOTE — Progress Notes (Signed)
PROGRESS NOTE    [name]  ZOX:[id number] DOB: [birth year] DOA: 01/06/2020  PCP: [name], MD    Brief Narrative:  [name] is a 54 year old man PMH discitis April 2021 status post laminectomy, history of schizophrenia, cocaine use presented with shoulder and back pain after mechanical fall. Found to have isolated rib fracture, lumbar progressive discitis and osteomyelitis with septic arthritis at L3-4 and associated epidural phlegmon. Patient evaluated by neurosurgery with recommendation for medical treatment. Seen by infectious disease. Continues on 6 weeks of IV antibiotics. Also developed worsening shoulder pain, seen by orthopedics with conclusion chronic inflammation and recommendation for surgery after a year infection free.   Assessment & Plan: Recurrent discitis, osteomyelitis L3-4 with associated epidural phlegmon --Continue antibiotics x 6 weeks with Vanc/Cefepime, plan completion November 21. --ESR 38>8>8 --CRP 2.1>0.7>0.6>1.0 --Continue pain management --Repeat MRI lumbar spine w/wo contrast 02/14/2020 with interval improvement paraspinal inflammatory changes with no residual epidural fluid collection appreciated. --Consulted ID, Dr. Elinor Parkinson to update recommendations, she states will place note --per ID Dalbavancin after he completes the above treatment when he gets discharged along with 2 weeks worth of ciprofloxacin to complete 8 weeks of treatment given hg/o recurrence.    A follow up appointment with myself has been made at [facility] clinic on Dec 2 at 1:45 pm.  Large glenohumeral joint effusion thought to be posttraumatic Evaluated by Dr. Otelia Sergeant of [facility] orthopedics ON 02/07/2020.  Likely inflammatory fusion right shoulder with avascular necrosis and severe osteoarthritis, not a candidate for surgical joint replacement at this time. --Continue PT/OT efforts --follow-up w/ orthopedics as an outpatient; no surgery before 1 year  post-infeciton.  PMH cocaine use, alcohol use --Counseled earlier in hospitalization  COPD --remains stable  Schizophrenia --On Invega every 3 months. Continue Cogentin, Wellbutrin and Remeron.  Acute nondisplaced fracture left ninth rib --Supportive care   DVT prophylaxis: Lovenox, SCDs, ambulation Code Status: Full code Family Communication: None present at bedside this morning  Disposition Plan:  Status is: Inpatient  Remains inpatient appropriate because:Unsafe d/c plan, IV treatments appropriate due to intensity of illness or inability to take PO and Inpatient level of care appropriate due to severity of illness   Dispo: The patient is from: Home  Anticipated d/c is to: Home  Anticipated d/c date is: > 3 days  Patient currently is not medically stable to d/c.   Consultants:   Infectious disease  Neurosurgery  Orthopedics, Dr. Otelia Sergeant  Interventional radiology  Procedures:   Unsuccessful disc aspiration lumbar spine    Subjective/Overview: Patient seen and examined at bedside, lying in bed.  No complaints this morning.  Discussed MRI findings with patient this morning with no residual epidural fluid collection.  Reconsulted ID for any further recommendations.  Denies headache, no fever/chills/night sweats, no nausea/vomiting/diarrhea, no chest pain, palpitations, no shortness of breath, no abdominal pain, no weakness, no fatigue, no paresthesia.  No acute events overnight per nursing staff.  02/16/2020 Pt seen today for f/u and he is resting comfortably and reports back pain.  He is keen to go home. Vitals are stable and pt is afebrile. Labs are stable. Plan is to cont with iv abx per ID plan and d/c as below: Todays is 36/42 days of IV abx. Please give him a dose of Dalbavancin after he completes the above treatment when he gets discharged along with 2 weeks worth of ciprofloxacin to complete 8 weeks of  treatment given h/o recurrence.  F/u with IC on December 2,2021 at 1:45  pm.   02/17/20 Pt is alert,awake and denies any complaints.  Objective: Vitals:   02/16/20 1706 02/16/20 2036 02/17/20 0442 02/17/20 1123  BP: (!) 141/100 120/87 115/78 (!) 119/95  Pulse: 93 96 98 (!) 102  Resp: 18 18 18 18   Temp: 98 F (36.7 C) 98.2 F (36.8 C) 98 F (36.7 C) 98.5 F (36.9 C)  TempSrc: Oral  Oral Oral  SpO2: 99% 100% 100% 97%  Weight:      Height:       SpO2: 97 % O2 Flow Rate (L/min): 2 L/min  Intake/Output Summary (Last 24 hours) at 02/17/2020 1504 Last data filed at 02/17/2020 1304 Gross per 24 hour  Intake 1657.93 ml  Output 300 ml  Net 1357.93 ml   Filed Weights   02/11/20 0509 02/14/20 2138 02/15/20 2115  Weight: 93.1 kg 94 kg 94 kg    Examination: Blood pressure (!) 119/95, pulse (!) 102, temperature 98.5 F (36.9 C), temperature source Oral, resp. rate 18, height 5\' 9"  (1.753 m), weight 94 kg, SpO2 97 %. General exam: Appears calm and comfortable  HEENT:EOMI, perrl  Respiratory system: Clear to auscultation. Respiratory effort normal. Cardiovascular system: S1 & S2 heard, RRR. No JVD, murmurs, rubs, gallops or clicks. No pedal edema. Gastrointestinal system: Abdomen is nondistended, soft and nontender. No organomegaly or masses felt. Normal bowel sounds heard. Central nervous system: Alert and oriented.CN grossly intact No focal neurological deficits. Extremities: pt moving all 4 ext and ambulating. Skin: No rashes, lesions or ulcers Psychiatry: Judgement and insight appear normal. Mood & affect appropriate.     Data Reviewed: I have personally reviewed following labs and imaging studies  Labs  No results for input(s): CKTOTAL, CKMB, TROPONINI in the last 72 hours. Lab Results  Component Value Date   WBC 8.9 02/06/2020   HGB 11.6 (L) 02/06/2020   HCT 37.1 (L) 02/06/2020   MCV 86.9 02/06/2020   PLT 264 02/06/2020    Recent Labs  Lab 02/17/20 0350  NA 135   K 4.0  CL 100  CO2 25  BUN 9  CREATININE 1.04  CALCIUM 9.1  GLUCOSE 104*   Lab Results  Component Value Date   CHOL 171 03/02/2014   HDL 34 (L) 03/02/2014   LDLCALC 95 03/02/2014   TRIG 212 (H) 03/02/2014   No results found for: DDIMER Invalid input(s): POCBNP  echocardiogram   Radiology Studies: No results found.      Current Facility-Administered Medications (Cardiovascular):  .  metoprolol succinate (TOPROL-XL) 24 hr tablet 12.5 mg     Current Facility-Administered Medications (Analgesics):  .  morphine (MSIR) tablet 30 mg   Current Facility-Administered Medications (Hematological):  .  enoxaparin (LOVENOX) injection 40 mg .  ferrous sulfate tablet 325 mg .  vitamin B-12 (CYANOCOBALAMIN) tablet 100 mcg   Current Facility-Administered Medications (Other):  .  0.9 %  sodium chloride infusion .  alum & mag hydroxide-simeth (MAALOX/MYLANTA) 200-200-20 MG/5ML suspension 15 mL .  benztropine (COGENTIN) tablet 1 mg .  buPROPion (WELLBUTRIN XL) 24 hr tablet 150 mg .  ceFEPIme (MAXIPIME) 2 g in sodium chloride 0.9 % 100 mL IVPB .  [START ON 02/19/2020] ciprofloxacin (CIPRO) tablet 750 mg .  cyclobenzaprine (FLEXERIL) tablet 5 mg .  diclofenac Sodium (VOLTAREN) 1 % topical gel 2 g .  gabapentin (NEURONTIN) capsule 300 mg .  lidocaine (LIDODERM) 5 % 1 patch .  lip balm (CARMEX) ointment .  melatonin tablet 5 mg .  mirtazapine (REMERON) tablet 15  mg .  nicotine (NICODERM CQ - dosed in mg/24 hours) patch 14 mg .  ondansetron (ZOFRAN) injection 4 mg .  [START ON 02/19/2020] Oritavancin Diphosphate (ORBACTIV) 1,200 mg in dextrose 5 % IVPB .  pantoprazole (PROTONIX) EC tablet 40 mg .  saccharomyces boulardii (FLORASTOR) capsule 250 mg .  senna-docusate (Senokot-S) tablet 1 tablet .  sodium chloride flush (NS) 0.9 % injection 10-40 mL .  sodium chloride flush (NS) 0.9 % injection 10-40 mL .  sodium chloride flush (NS) 0.9 % injection 10-40 mL .  sodium chloride  flush (NS) 0.9 % injection 3 mL .  sodium chloride flush (NS) 0.9 % injection 3 mL .  vancomycin (VANCOCIN) IVPB 1000 mg/200 mL premix .  Vitamin D (Ergocalciferol) (DRISDOL) capsule 50,000 Units  No current outpatient medications on file.  Anti-infectives (From admission, onward)   Start     Dose/Rate Route Frequency Ordered Stop   02/19/20 1200  Oritavancin Diphosphate (ORBACTIV) 1,200 mg in dextrose 5 % IVPB        1,200 mg 333.3 mL/hr over 180 Minutes Intravenous Once 02/16/20 1233     02/19/20 0800  ciprofloxacin (CIPRO) tablet 750 mg        750 mg Oral 2 times daily 02/16/20 1233 03/04/20 0759   01/22/20 1430  vancomycin (VANCOCIN) IVPB 1000 mg/200 mL premix        1,000 mg 200 mL/hr over 60 Minutes Intravenous Every 12 hours 01/22/20 1349 02/18/20 2359   01/18/20 1200  vancomycin (VANCOREADY) IVPB 1250 mg/250 mL  Status:  Discontinued        1,250 mg 166.7 mL/hr over 90 Minutes Intravenous Every 12 hours 01/18/20 1125 01/22/20 1349   01/11/20 1400  ceFEPIme (MAXIPIME) 2 g in sodium chloride 0.9 % 100 mL IVPB        2 g 200 mL/hr over 30 Minutes Intravenous Every 8 hours 01/11/20 1013 02/18/20 2359   01/09/20 0400  vancomycin (VANCOREADY) IVPB 1500 mg/300 mL  Status:  Discontinued        1,500 mg 150 mL/hr over 120 Minutes Intravenous Every 12 hours 01/08/20 1900 01/18/20 1125   01/08/20 1900  vancomycin (VANCOREADY) IVPB 1750 mg/350 mL        1,750 mg 175 mL/hr over 120 Minutes Intravenous  Once 01/08/20 1900 01/08/20 2240   01/08/20 1818  cefTRIAXone (ROCEPHIN) 2 g in sodium chloride 0.9 % 100 mL IVPB  Status:  Discontinued        2 g 200 mL/hr over 30 Minutes Intravenous Every 24 hours 01/08/20 1813 01/11/20 1011       Continuous Infusions: . sodium chloride 250 mL (01/30/20 1407)  . ceFEPime (MAXIPIME) IV 2 g (02/17/20 1304)  . [START ON 02/19/2020] oritavancin (ORBACTIV) IVPB    . vancomycin Stopped (02/17/20 0612)     LOS: 41 days    Gertha Calkin, MD [facility] Pager [phone] How to contact the [facility] Attending or Consulting provider 7A - 7P or covering provider during after hours 7P -7A, for this patient?    1. Check the care team in [facility] and look for a) attending/consulting TRH provider listed and b) the [facility] team listed 2. Log into [email or web address] and use [place]'s universal password to access. If you do not have the password, please contact the hospital operator. 3. Locate the [facility] provider you are looking for under [facility] and page to a number that you can be directly reached. 4. If you still have difficulty  reaching the provider, please page the [facility] (Director on Call) for the Hospitalists listed on amion for assistance. [email or web address] Password [facility] 02/17/2020, 3:04 PM

## 2020-02-17 NOTE — Progress Notes (Signed)
Occupational Therapy Treatment Patient Details Name: [name] MRN: [id number] DOB: [birth year] Today's Date: 02/17/2020    History of present illness 54 y/o male with PMH of discitis April 2021, s/p laminectormy, schizophrenia, cocaine use, presenting with mechanical fall in the shower with shoulder and back pain. Imaging reveals: progresive discitits osteomyelitis of LS spine, Septic arthriits at L3-4, spinal stenosis L1-2 and improved discitis at L1-2.  Underwent apsiration/core biopsy 10/11, neurosurgery recommends medical tx. Acute non displaced Fx of anterolateral L 9th rib; R shoulder with no acute findings.  Complains of R shoulder pain, OT ordered again 11/10.  Found with moderatelysevere osteoarthritis right glenohumeral joint.    OT comments  Pt. Seen for skilled OT treatment session.  Focus of session continuation of HEP introduced at previous session. Pt states he has been "trying" to complete more than 1x a day but gets tired. Encouraged to build the 3x around meals so it is easy to remember.  Pt. With good demo of lap slides and use of bed rail for additional provided exercises.    Follow Up Recommendations  Outpatient OT;Supervision - Intermittent    Equipment Recommendations  3 in 1 bedside commode    Recommendations for Other Services      Precautions / Restrictions Precautions Precautions: None Restrictions Weight Bearing Restrictions: No       Mobility Bed Mobility                  Transfers                      Balance                                           ADL either performed or assessed with clinical judgement   ADL                                               Vision       Perception     Praxis      Cognition                                                Exercises Other Exercises Other Exercises: completed HEP for R shoulder including shoulder elevation,  shoulder retraction, lap/table slides  [id number] Medbridge; requires min cueing for technique and full ROM on R side; 2 sets x 10 reps   Other Exercises: completed angled slides on 30* upper bed rail x 10 reps 2 sets to progress shoulder flexion with AAROM    Shoulder Instructions       General Comments      Pertinent Vitals/ Pain       Pain Assessment: No/denies pain  Home Living                                          Prior Functioning/Environment              Frequency  Min 2X/week  Progress Toward Goals  OT Goals(current goals can now be found in the care plan section)  Progress towards OT goals: Progressing toward goals     Plan Discharge plan remains appropriate;Frequency remains appropriate    Co-evaluation                 AM-PAC OT "6 Clicks" Daily Activity     Outcome Measure   Help from another person eating meals?: None Help from another person taking care of personal grooming?: None Help from another person toileting, which includes using toliet, bedpan, or urinal?: None Help from another person bathing (including washing, rinsing, drying)?: None Help from another person to put on and taking off regular upper body clothing?: None Help from another person to put on and taking off regular lower body clothing?: None 6 Click Score: 24    End of Session    OT Visit Diagnosis: Pain Pain - Right/Left: Right Pain - part of body: Shoulder   Activity Tolerance Patient tolerated treatment well   Patient Left in bed;with call bell/phone within reach   Nurse Communication          Time: 4098-1191 OT Time Calculation (min): 8 min  Charges: OT General Charges $OT Visit: 1 Visit OT Treatments $Therapeutic Exercise: 8-22 mins  Boneta Lucks, COTA/L Acute Rehabilitation [phone]   Robet Leu 02/17/2020, 1:31 PM

## 2020-02-18 ENCOUNTER — Inpatient Hospital Stay (HOSPITAL_COMMUNITY): Payer: Medicare Other

## 2020-02-18 DIAGNOSIS — M79609 Pain in unspecified limb: Secondary | ICD-10-CM | POA: Diagnosis not present

## 2020-02-18 NOTE — Progress Notes (Signed)
Upper extremity venous has been completed.   Preliminary results in CV Proc.   Abram Sander 02/18/2020 1:49 PM

## 2020-02-18 NOTE — Progress Notes (Signed)
PROGRESS NOTE    [name]  WUJ:[id number] DOB: Dec 16, 1965 DOA: 01/06/2020  PCP: [name], MD    Brief Narrative:  [name] is a 54 year old man PMH discitis April 2021 status post laminectomy, history of schizophrenia, cocaine use presented with shoulder and back pain after mechanical fall. Found to have isolated rib fracture, lumbar progressive discitis and osteomyelitis with septic arthritis at L3-4 and associated epidural phlegmon. Patient evaluated by neurosurgery with recommendation for medical treatment. Seen by infectious disease. Continues on 6 weeks of IV antibiotics. Also developed worsening shoulder pain, seen by orthopedics with conclusion chronic inflammation and recommendation for surgery after a year infection free.   Assessment & Plan: Recurrent discitis, osteomyelitis L3-4 with associated epidural phlegmon --Continue antibiotics x 6 weeks with Vanc/Cefepime, plan completion November 21. --ESR 38>8>8 --CRP 2.1>0.7>0.6>1.0 --Continue pain management --Repeat MRI lumbar spine w/wo contrast 02/14/2020 with interval improvement paraspinal inflammatory changes with no residual epidural fluid collection appreciated. --Consulted ID, Dr. Elinor Parkinson to update recommendations, she states will place note --per ID Dalbavancin after he completes the above treatment when he gets discharged along with 2 weeks worth of ciprofloxacin to complete 8 weeks of treatment given h/o recurrence.  --IV abx and d/c plan per ID A follow up appointment with ID has been made at [facility] clinic on Dec 2 at 1:45 pm.  -- pt is to continue iv abx today is day 39/42 and we will then give pt dalbavancin and 2 weeks of ciprofloxacin. D/c date is planned for 02/21/20.  Large glenohumeral joint effusion thought to be posttraumatic Evaluated by Dr. Otelia Sergeant of [facility] orthopedics ON 02/07/2020.  Likely inflammatory fusion right shoulder with avascular necrosis and severe osteoarthritis, not a  candidate for surgical joint replacement at this time. --Continue PT/OT efforts --follow-up w/ orthopedics as an outpatient; no surgery before 1 year post-infeciton. --rt shoulder usg neg for dvt.   PMH cocaine use, alcohol use --Counseled earlier in hospitalization  COPD --remains stable  Schizophrenia --On Invega every 3 months. Continue Cogentin, Wellbutrin and Remeron.  Acute nondisplaced fracture left ninth rib --Supportive care   DVT prophylaxis: Lovenox, SCDs, ambulation Code Status: Full code Family Communication: None present at bedside this morning  Disposition Plan:  Status is: Inpatient  Remains inpatient appropriate because:Unsafe d/c plan, IV treatments appropriate due to intensity of illness or inability to take PO and Inpatient level of care appropriate due to severity of illness   Dispo: The patient is from: Home  Anticipated d/c is to: Home  Anticipated d/c date is: > 3 days  Patient currently is not medically stable to d/c.   Consultants:   Infectious disease  Neurosurgery  Orthopedics, Dr. Otelia Sergeant  Interventional radiology  Procedures:   Unsuccessful disc aspiration lumbar spine    Subjective/Overview: Patient seen and examined at bedside, lying in bed.  No complaints this morning.  Discussed MRI findings with patient this morning with no residual epidural fluid collection.  Reconsulted ID for any further recommendations.  Denies headache, no fever/chills/night sweats, no nausea/vomiting/diarrhea, no chest pain, palpitations, no shortness of breath, no abdominal pain, no weakness, no fatigue, no paresthesia.  No acute events overnight per nursing staff.  02/16/2020 Pt seen today for f/u and he is resting comfortably and reports back pain.  He is keen to go home. Vitals are stable and pt is afebrile. Labs are stable. Plan is to cont with iv abx per ID plan and d/c as below: Todays is 36/42 days  of IV abx.  Please give him a dose of Dalbavancin after he completes the above treatment when he gets discharged along with 2 weeks worth of ciprofloxacin to complete 8 weeks of treatment given h/o recurrence.  F/u with IC on December 2,2021 at 1:45 pm.   02/17/20 Pt is alert,awake and denies any complaints. 02/18/20 Pt is alert and reports rt shoulder pain and otherwise no complaints.  usg is negative.   Objective: Vitals:   02/17/20 1600 02/17/20 1900 02/18/20 0300 02/18/20 1703  BP: (!) 125/102 (!) 133/93 118/80 (!) 133/94  Pulse: 100 95 85 98  Resp: 18 18 16    Temp: 98.4 F (36.9 C) 98.9 F (37.2 C) 98.3 F (36.8 C) 99 F (37.2 C)  TempSrc: Oral Oral Oral Oral  SpO2: 100% 100% 98% 100%  Weight:  94.3 kg    Height:       SpO2: 100 % O2 Flow Rate (L/min): 2 L/min  Intake/Output Summary (Last 24 hours) at 02/18/2020 1725 Last data filed at 02/18/2020 0745 Gross per 24 hour  Intake 1088.54 ml  Output 1175 ml  Net -86.46 ml   Filed Weights   02/14/20 2138 02/15/20 2115 02/17/20 1900  Weight: 94 kg 94 kg 94.3 kg    Examination: Blood pressure (!) 133/94, pulse 98, temperature 99 F (37.2 C), temperature source Oral, resp. rate 16, height 5\' 9"  (1.753 m), weight 94.3 kg, SpO2 100 %. General exam: Appears calm and comfortable  HEENT:EOMI, perrl  Respiratory system: Clear to auscultation. Respiratory effort normal. Cardiovascular system: S1 & S2 heard, RRR. No JVD, murmurs, rubs, gallops or clicks. No pedal edema. Gastrointestinal system: Abdomen is nondistended, soft and nontender. No organomegaly or masses felt. Normal bowel sounds heard. Central nervous system: Alert and oriented.CN grossly intact No focal neurological deficits. Extremities: pt moving all 4 ext and ambulating. Skin: No rashes, lesions or ulcers Psychiatry: Judgement and insight appear normal. Mood & affect appropriate.     Data Reviewed: I have personally reviewed following labs and imaging  studies  Labs  No results for input(s): CKTOTAL, CKMB, TROPONINI in the last 72 hours. Lab Results  Component Value Date   WBC 8.9 02/06/2020   HGB 11.6 (L) 02/06/2020   HCT 37.1 (L) 02/06/2020   MCV 86.9 02/06/2020   PLT 264 02/06/2020    Recent Labs  Lab 02/17/20 0350  NA 135  K 4.0  CL 100  CO2 25  BUN 9  CREATININE 1.04  CALCIUM 9.1  GLUCOSE 104*   Lab Results  Component Value Date   CHOL 171 03/02/2014   HDL 34 (L) 03/02/2014   LDLCALC 95 03/02/2014   TRIG 212 (H) 03/02/2014   No results found for: DDIMER Invalid input(s): POCBNP  echocardiogram   Radiology Studies: VAS [place] UPPER EXTREMITY VENOUS DUPLEX  Result Date: 02/18/2020 UPPER VENOUS STUDY  Indications: Pain Comparison Study: no prior Performing Technologist: Blanch Media RVS  Examination Guidelines: A complete evaluation includes B-mode imaging, spectral Doppler, color Doppler, and power Doppler as needed of all accessible portions of each vessel. Bilateral testing is considered an integral part of a complete examination. Limited examinations for reoccurring indications may be performed as noted.  Right Findings: +----------+------------+---------+-----------+----------+-------+ RIGHT     CompressiblePhasicitySpontaneousPropertiesSummary +----------+------------+---------+-----------+----------+-------+ IJV           Full       Yes       Yes                      +----------+------------+---------+-----------+----------+-------+  Subclavian    Full       Yes       Yes                      +----------+------------+---------+-----------+----------+-------+ Axillary      Full       Yes       Yes                      +----------+------------+---------+-----------+----------+-------+ Brachial      Full       Yes       Yes                      +----------+------------+---------+-----------+----------+-------+ Radial        Full                                           +----------+------------+---------+-----------+----------+-------+ Ulnar         Full                                          +----------+------------+---------+-----------+----------+-------+ Cephalic      Full                                          +----------+------------+---------+-----------+----------+-------+ Basilic       Full                                          +----------+------------+---------+-----------+----------+-------+  Summary:  Right: No evidence of deep vein thrombosis in the upper extremity. No evidence of superficial vein thrombosis in the upper extremity. No evidence of thrombosis in the subclavian.  *See table(s) above for measurements and observations.    Preliminary         Current Facility-Administered Medications (Cardiovascular):  .  metoprolol succinate (TOPROL-XL) 24 hr tablet 12.5 mg     Current Facility-Administered Medications (Analgesics):  .  morphine (MSIR) tablet 30 mg   Current Facility-Administered Medications (Hematological):  .  enoxaparin (LOVENOX) injection 40 mg .  ferrous sulfate tablet 325 mg .  vitamin B-12 (CYANOCOBALAMIN) tablet 100 mcg   Current Facility-Administered Medications (Other):  .  0.9 %  sodium chloride infusion .  alum & mag hydroxide-simeth (MAALOX/MYLANTA) 200-200-20 MG/5ML suspension 15 mL .  benztropine (COGENTIN) tablet 1 mg .  buPROPion (WELLBUTRIN XL) 24 hr tablet 150 mg .  ceFEPIme (MAXIPIME) 2 g in sodium chloride 0.9 % 100 mL IVPB .  [START ON 02/19/2020] ciprofloxacin (CIPRO) tablet 750 mg .  cyclobenzaprine (FLEXERIL) tablet 5 mg .  diclofenac Sodium (VOLTAREN) 1 % topical gel 2 g .  gabapentin (NEURONTIN) capsule 300 mg .  lidocaine (LIDODERM) 5 % 1 patch .  lip balm (CARMEX) ointment .  melatonin tablet 5 mg .  mirtazapine (REMERON) tablet 15 mg .  nicotine (NICODERM CQ - dosed in mg/24 hours) patch 14 mg .  ondansetron (ZOFRAN) injection 4 mg .  [START ON 02/19/2020]  Oritavancin Diphosphate (ORBACTIV) 1,200 mg in dextrose 5 % IVPB .  pantoprazole (  PROTONIX) EC tablet 40 mg .  saccharomyces boulardii (FLORASTOR) capsule 250 mg .  senna-docusate (Senokot-S) tablet 1 tablet .  sodium chloride flush (NS) 0.9 % injection 10-40 mL .  sodium chloride flush (NS) 0.9 % injection 10-40 mL .  sodium chloride flush (NS) 0.9 % injection 10-40 mL .  sodium chloride flush (NS) 0.9 % injection 3 mL .  sodium chloride flush (NS) 0.9 % injection 3 mL .  vancomycin (VANCOCIN) IVPB 1000 mg/200 mL premix .  Vitamin D (Ergocalciferol) (DRISDOL) capsule 50,000 Units  No current outpatient medications on file.  Anti-infectives (From admission, onward)   Start     Dose/Rate Route Frequency Ordered Stop   02/19/20 1200  Oritavancin Diphosphate (ORBACTIV) 1,200 mg in dextrose 5 % IVPB        1,200 mg 333.3 mL/hr over 180 Minutes Intravenous Once 02/16/20 1233     02/19/20 0800  ciprofloxacin (CIPRO) tablet 750 mg        750 mg Oral 2 times daily 02/16/20 1233 03/04/20 0759   01/22/20 1430  vancomycin (VANCOCIN) IVPB 1000 mg/200 mL premix        1,000 mg 200 mL/hr over 60 Minutes Intravenous Every 12 hours 01/22/20 1349 02/18/20 2359   01/18/20 1200  vancomycin (VANCOREADY) IVPB 1250 mg/250 mL  Status:  Discontinued        1,250 mg 166.7 mL/hr over 90 Minutes Intravenous Every 12 hours 01/18/20 1125 01/22/20 1349   01/11/20 1400  ceFEPIme (MAXIPIME) 2 g in sodium chloride 0.9 % 100 mL IVPB        2 g 200 mL/hr over 30 Minutes Intravenous Every 8 hours 01/11/20 1013 02/18/20 2359   01/09/20 0400  vancomycin (VANCOREADY) IVPB 1500 mg/300 mL  Status:  Discontinued        1,500 mg 150 mL/hr over 120 Minutes Intravenous Every 12 hours 01/08/20 1900 01/18/20 1125   01/08/20 1900  vancomycin (VANCOREADY) IVPB 1750 mg/350 mL        1,750 mg 175 mL/hr over 120 Minutes Intravenous  Once 01/08/20 1900 01/08/20 2240   01/08/20 1818  cefTRIAXone (ROCEPHIN) 2 g in sodium chloride 0.9  % 100 mL IVPB  Status:  Discontinued        2 g 200 mL/hr over 30 Minutes Intravenous Every 24 hours 01/08/20 1813 01/11/20 1011       Continuous Infusions: . sodium chloride 250 mL (01/30/20 1407)  . ceFEPime (MAXIPIME) IV 2 g (02/18/20 1533)  . [START ON 02/19/2020] oritavancin (ORBACTIV) IVPB    . vancomycin Stopped (02/18/20 9562)     LOS: 42 days    Gertha Calkin, MD [facility] Pager [phone] How to contact the [facility] Attending or Consulting provider 7A - 7P or covering provider during after hours 7P -7A, for this patient?    1. Check the care team in [facility] and look for a) attending/consulting TRH provider listed and b) the [facility] team listed 2. Log into [email or web address] and use [place]'s universal password to access. If you do not have the password, please contact the hospital operator. 3. Locate the [facility] provider you are looking for under [facility] and page to a number that you can be directly reached. 4. If you still have difficulty reaching the provider, please page the [facility] (Director on Call) for the Hospitalists listed on amion for assistance. [email or web address] Password [email or web address] 02/18/2020, 5:25 PM

## 2020-02-19 ENCOUNTER — Inpatient Hospital Stay (HOSPITAL_COMMUNITY): Payer: Medicare Other

## 2020-02-19 LAB — COMPREHENSIVE METABOLIC PANEL
ALT: 9 U/L (ref 0–44)
AST: 23 U/L (ref 15–41)
Albumin: 3.6 g/dL (ref 3.5–5.0)
Alkaline Phosphatase: 119 U/L (ref 38–126)
Anion gap: 8 (ref 5–15)
BUN: 10 mg/dL (ref 6–20)
CO2: 24 mmol/L (ref 22–32)
Calcium: 8.7 mg/dL — ABNORMAL LOW (ref 8.9–10.3)
Chloride: 101 mmol/L (ref 98–111)
Creatinine, Ser: 1.12 mg/dL (ref 0.61–1.24)
GFR, Estimated: >60 mL/min (ref >60)
Glucose, Bld: 158 mg/dL — ABNORMAL HIGH (ref 70–99)
Potassium: 3.9 mmol/L (ref 3.5–5.1)
Sodium: 133 mmol/L — ABNORMAL LOW (ref 135–145)
Total Bilirubin: 0.8 mg/dL (ref 0.3–1.2)
Total Protein: 6.8 g/dL (ref 6.5–8.1)

## 2020-02-19 LAB — CBC
HCT: 37.4 % — ABNORMAL LOW (ref 39.0–52.0)
Hemoglobin: 11.5 g/dL — ABNORMAL LOW (ref 13.0–17.0)
MCH: 26.6 pg (ref 26.0–34.0)
MCHC: 30.7 g/dL (ref 30.0–36.0)
MCV: 86.6 fL (ref 80.0–100.0)
Platelets: 269 10*3/uL (ref 150–400)
RBC: 4.32 MIL/uL (ref 4.22–5.81)
RDW: 15.7 % — ABNORMAL HIGH (ref 11.5–15.5)
WBC: 8.2 10*3/uL (ref 4.0–10.5)
nRBC: 0 % (ref 0.0–0.2)

## 2020-02-19 NOTE — TOC Initial Note (Signed)
Transition of Care [facility]) - Initial/Assessment Note    Patient Details  Name: [name] MRN: [id number] Date of Birth: 1966-03-01  Transition of [facility]) CM/SW Contact:    Bess Kinds, RN Phone Number: [phone] 02/19/2020, 4:42 PM  Clinical Narrative:                  Spoke with patient at the bedside. Patient stated that he is to discharge today and requesting assistance with transportation. Rider waiver signed and faxed to transportation. No discharge order written. Discussed readiness to transition with MD - patient likely to be ready to transition in the AM. Will arrange transportation once discharge ordered. TOC following for transition needs.   Expected Discharge Plan: Home/Self Care Barriers to Discharge: Continued Medical Work up   Patient Goals and CMS Choice Patient states their goals for this hospitalization and ongoing recovery are:: return home CMS Medicare.gov Compare Post Acute Care list provided to:: Patient Choice offered to / list presented to : NA  Expected Discharge Plan and Services Expected Discharge Plan: Home/Self Care   Discharge Planning Services: CM Consult Post Acute Care Choice: NA Living arrangements for the past 2 months: Apartment Expected Discharge Date: 01/14/20               DME Arranged: N/A DME Agency: NA       HH Arranged: NA HH Agency: NA        Prior Living Arrangements/Services Living arrangements for the past 2 months: Apartment Lives with:: Self, Significant Other Patient language and need for interpreter reviewed:: Yes Do you feel safe going back to the place where you live?: Yes      Need for Family Participation in Patient Care: No (Comment)     Criminal Activity/Legal Involvement Pertinent to Current Situation/Hospitalization: No - Comment as needed  Activities of Daily Living Home Assistive Devices/Equipment: Environmental consultant (specify type) (rollaider) ADL Screening (condition at time of admission) Patient's  cognitive ability adequate to safely complete daily activities?: Yes Is the patient deaf or have difficulty hearing?: Yes Does the patient have difficulty seeing, even when wearing glasses/contacts?: No Does the patient have difficulty concentrating, remembering, or making decisions?: No Patient able to express need for assistance with ADLs?: Yes Does the patient have difficulty dressing or bathing?: No Independently performs ADLs?: Yes (appropriate for developmental age) Does the patient have difficulty walking or climbing stairs?: Yes Weakness of Legs: None Weakness of Arms/Hands: None  Permission Sought/Granted Permission sought to share information with : Family Supports    Share Information with NAME: Okey Regal     Permission granted to share info w Relationship: fiance  Permission granted to share info w Contact Information: [phone]  Emotional Assessment Appearance:: Appears stated age Attitude/Demeanor/Rapport: Engaged Affect (typically observed): Accepting Orientation: : Oriented to Self, Oriented to  Time, Oriented to Place, Oriented to Situation Alcohol / Substance Use: Illicit Drugs Psych Involvement: No (comment)  Admission diagnosis:  Discitis of lumbar region [M46.46] Osteomyelitis (HCC) [M86.9] Closed fracture of one rib of left side, initial encounter [S22.32XA] Patient Active Problem List   Diagnosis Date Noted  . Ankle ankylosis, left   . Closed fracture of one rib of left side   . Discitis of lumbar region   . Osteomyelitis (HCC) 01/07/2020  . Acute osteomyelitis of lumbar spine (HCC) 08/16/2019  . Altered mental status   . Delirium   . Toxic metabolic encephalopathy 07/26/2019  . Polysubstance overdose 07/26/2019  . Acute kidney injury (HCC) 07/26/2019  .  Chronic pain syndrome 07/26/2019  . SIRS (systemic inflammatory response syndrome) (HCC) 07/26/2019  . Spinal stenosis, lumbar region, with neurogenic claudication 04/11/2019    Class: Chronic  .  Lumbar disc herniation 04/11/2019    Class: Chronic  . Status post lumbar spinal fusion 04/11/2019  . Hardware complicating wound infection (HCC) 01/31/2018  . Spondylolisthesis of lumbar region 12/30/2017  . S/P ankle fusion 10/20/2017  . Post-traumatic osteoarthritis, left ankle and foot 01/07/2017  . Primary osteoarthritis of right knee 05/27/2016  . GERD (gastroesophageal reflux disease) 07/09/2015  . Mild persistent asthma 07/09/2015  . Type 2 diabetes mellitus (HCC) 07/09/2015  . Polyp of duodenum 06/04/2015  . Alcohol dependence (HCC) 02/25/2015  . Moderate episode of recurrent major depressive disorder (HCC) 02/25/2015  . Upper GI bleed 02/18/2015  . Benign essential HTN 02/18/2015  . Anemia of chronic disease 02/18/2015  . Pulmonary nodule 02/18/2015  . Cocaine abuse (HCC) 03/02/2014  . Posttraumatic stress disorder 03/02/2014  . History of CVA (cerebrovascular accident) 01/12/2014  . Schizophrenia (HCC) 01/12/2014  . Nicotine dependence, cigarettes, uncomplicated 12/06/2013   PCP:  [name], MD Pharmacy:   [facility] [phone] [place], [place] - [phone] AT [facility] [address] [place] [place] [address] Phone: [phone] Fax: [phone]     Social Determinants of Health (SDOH) Interventions    Readmission Risk Interventions Readmission Risk Prevention Plan 08/21/2019  Transportation Screening Complete  Medication Review (RN Care Manager) Complete  HRI or Home Care Consult Complete  SW Recovery Care/Counseling Consult Complete  Palliative Care Screening Not Applicable  Skilled Nursing Facility Not Applicable  Some recent data might be hidden

## 2020-02-19 NOTE — Plan of Care (Signed)
  Problem: Pain Managment: Goal: General experience of comfort will improve Outcome: Progressing   

## 2020-02-19 NOTE — Progress Notes (Signed)
OT Cancellation Note  Patient Details Name: [name] MRN: [id number] DOB: 05/19/1965   Cancelled Treatment:    Reason Eval/Treat Not Completed: Other (comment) (RN advised to hold session due to receiving antibiotics.) OT will continue follow at a later date.   Hoy Morn, [place], OTR/L  Supplemental Rehabilitation Services  [phone]  02/19/2020, 2:43 PM

## 2020-02-20 LAB — SEDIMENTATION RATE: Sed Rate: 9 mm/hr (ref 0–16)

## 2020-02-20 LAB — C-REACTIVE PROTEIN: CRP: 0.6 mg/dL (ref <1.0)

## 2020-02-20 MED ORDER — CIPROFLOXACIN HCL 750 MG PO TABS: 750.0000 mg | ORAL_TABLET | Freq: Two times a day (BID) | ORAL | 0 refills | Status: DC

## 2020-02-20 MED ORDER — NICOTINE 14 MG/24HR TD PT24: 14.0000 mg | MEDICATED_PATCH | Freq: Every day | TRANSDERMAL | 0 refills | Status: DC

## 2020-02-20 MED ORDER — METOPROLOL SUCCINATE ER 25 MG PO TB24: 12.5000 mg | ORAL_TABLET | Freq: Every day | ORAL | 0 refills | Status: DC

## 2020-02-20 MED ORDER — PANTOPRAZOLE SODIUM 40 MG PO TBEC: 40.0000 mg | DELAYED_RELEASE_TABLET | Freq: Every day | ORAL | 0 refills | Status: DC

## 2020-02-20 MED ORDER — MORPHINE SULFATE 30 MG PO TABS: 30.0000 mg | ORAL_TABLET | Freq: Four times a day (QID) | ORAL | 0 refills | Status: AC | PRN

## 2020-02-20 MED ORDER — CYANOCOBALAMIN 100 MCG PO TABS: 100.0000 ug | ORAL_TABLET | Freq: Every day | ORAL | 0 refills | Status: AC

## 2020-02-20 MED ORDER — BUPROPION HCL ER (XL) 150 MG PO TB24: 150.0000 mg | ORAL_TABLET | Freq: Every day | ORAL | 0 refills | Status: DC

## 2020-02-20 MED ORDER — FERROUS SULFATE 325 (65 FE) MG PO TABS: 325.0000 mg | ORAL_TABLET | Freq: Every day | ORAL | 0 refills | Status: DC

## 2020-02-20 NOTE — Plan of Care (Signed)
?  Problem: Clinical Measurements: ?Goal: Will remain free from infection ?Outcome: Progressing ?  ?

## 2020-02-20 NOTE — Care Management Important Message (Signed)
Important Message  Patient Details  Name: [name] MRN: [id number] Date of Birth: 04/26/1965   Medicare Important Message Given:  Yes     [name] 02/20/2020, 10:28 AM

## 2020-02-20 NOTE — Progress Notes (Signed)
PROGRESS NOTE    [name]  NFA:[id number] DOB: April 24, 1965 DOA: 01/06/2020  PCP: [name], MD    Brief Narrative:  [name] is a 54 year old man PMH discitis April 2021 status post laminectomy, history of schizophrenia, cocaine use presented with shoulder and back pain after mechanical fall. Found to have isolated rib fracture, lumbar progressive discitis and osteomyelitis with septic arthritis at L3-4 and associated epidural phlegmon. Patient evaluated by neurosurgery with recommendation for medical treatment. Seen by infectious disease. Continues on 6 weeks of IV antibiotics. Also developed worsening shoulder pain, seen by orthopedics with conclusion chronic inflammation and recommendation for surgery after a year infection free.   Assessment & Plan: Recurrent discitis, osteomyelitis L3-4 with associated epidural phlegmon --Continue antibiotics x 6 weeks with Vanc/Cefepime, plan completion November 21. --ESR 38>8>8 --CRP 2.1>0.7>0.6>1.0 --Continue pain management --Repeat MRI lumbar spine w/wo contrast 02/14/2020 with interval improvement paraspinal inflammatory changes with no residual epidural fluid collection appreciated. --Consulted ID, Dr. Elinor Parkinson to update recommendations, she states will place note --per ID Dalbavancin after he completes the above treatment when he gets discharged along with 2 weeks worth of ciprofloxacin to complete 8 weeks of treatment given h/o recurrence.  --IV abx and d/c plan per ID A follow up appointment with ID has been made at [facility] clinic on Dec 2 at 1:45 pm.  -- pt is to continue iv abx today is day 39/42 and we will then give pt dalbavancin and 2 weeks of ciprofloxacin. D/c date is planned for 02/21/20.  Large glenohumeral joint effusion thought to be posttraumatic Evaluated by Dr. Otelia Sergeant of [facility] orthopedics ON 02/07/2020.  Likely inflammatory fusion right shoulder with avascular necrosis and severe osteoarthritis, not a  candidate for surgical joint replacement at this time. --Continue PT/OT efforts --follow-up w/ orthopedics as an outpatient; no surgery before 1 year post-infeciton. --rt shoulder usg neg for dvt.   PMH cocaine use, alcohol use --Counseled earlier in hospitalization  COPD --remains stable  Schizophrenia --On Invega every 3 months. Continue Cogentin, Wellbutrin and Remeron.  Acute nondisplaced fracture left ninth rib --Supportive care   DVT prophylaxis: Lovenox, SCDs, ambulation Code Status: Full code Family Communication: None present at bedside this morning  Disposition Plan:  Status is: Inpatient  Remains inpatient appropriate because:Unsafe d/c plan, IV treatments appropriate due to intensity of illness or inability to take PO and Inpatient level of care appropriate due to severity of illness   Dispo: The patient is from: Home  Anticipated d/c is to: Home  Anticipated d/c date is: > 3 days  Patient currently is not medically stable to d/c.   Consultants:   Infectious disease  Neurosurgery  Orthopedics, Dr. Otelia Sergeant  Interventional radiology  Procedures:   Unsuccessful disc aspiration lumbar spine    Subjective/Overview: Patient seen and examined at bedside, lying in bed.  No complaints this morning.  Discussed MRI findings with patient this morning with no residual epidural fluid collection.  Reconsulted ID for any further recommendations.  Denies headache, no fever/chills/night sweats, no nausea/vomiting/diarrhea, no chest pain, palpitations, no shortness of breath, no abdominal pain, no weakness, no fatigue, no paresthesia.  No acute events overnight per nursing staff.  02/16/2020 Pt seen today for f/u and he is resting comfortably and reports back pain.  He is keen to go home. Vitals are stable and pt is afebrile. Labs are stable. Plan is to cont with iv abx per ID plan and d/c as below: Todays is 36/42 days  of IV abx.  Please give him a dose of Dalbavancin after he completes the above treatment when he gets discharged along with 2 weeks worth of ciprofloxacin to complete 8 weeks of treatment given h/o recurrence.  F/u with IC on December 2,2021 at 1:45 pm.   02/17/20 Pt is alert,awake and denies any complaints. 02/18/20 Pt is alert and reports rt shoulder pain and otherwise no complaints.  usg is negative.  02/19/20 Pt to receive vancomycina and d/c tomorrow after pt's abx in am.   Objective: Vitals:   02/19/20 1655 02/19/20 2205 02/20/20 0452 02/20/20 0936  BP: (!) 146/96 127/90 (!) 115/96 (!) 126/92  Pulse: 93 99 94 98  Resp: 18 18 16 18   Temp: 97.8 F (36.6 C) 98.5 F (36.9 C) 98.3 F (36.8 C) 97.7 F (36.5 C)  TempSrc:  Oral  Oral  SpO2: 99% 97% 96% 98%  Weight:  90 kg    Height:       SpO2: 98 % O2 Flow Rate (L/min): 2 L/min  Intake/Output Summary (Last 24 hours) at 02/20/2020 1406 Last data filed at 02/20/2020 0900 Gross per 24 hour  Intake 1600.27 ml  Output 0 ml  Net 1600.27 ml   Filed Weights   02/17/20 1900 02/18/20 2141 02/19/20 2205  Weight: 94.3 kg 87.6 kg 90 kg    Examination: Blood pressure (!) 126/92, pulse 98, temperature 97.7 F (36.5 C), temperature source Oral, resp. rate 18, height 5\' 9"  (1.753 m), weight 90 kg, SpO2 98 %. General exam: Appears calm and comfortable  HEENT:EOMI, perrl  Respiratory system: Clear to auscultation. Respiratory effort normal. Cardiovascular system: S1 & S2 heard, RRR. No JVD, murmurs, rubs, gallops or clicks. No pedal edema. Gastrointestinal system: Abdomen is nondistended, soft and nontender. No organomegaly or masses felt. Normal bowel sounds heard. Central nervous system: Alert and oriented.CN grossly intact No focal neurological deficits. Extremities: pt moving all 4 ext and ambulating. Skin: No rashes, lesions or ulcers Psychiatry: Judgement and insight appear normal. Mood & affect appropriate.     Data  Reviewed: I have personally reviewed following labs and imaging studies  Labs  No results for input(s): CKTOTAL, CKMB, TROPONINI in the last 72 hours. Lab Results  Component Value Date   WBC 8.2 02/19/2020   HGB 11.5 (L) 02/19/2020   HCT 37.4 (L) 02/19/2020   MCV 86.6 02/19/2020   PLT 269 02/19/2020    Recent Labs  Lab 02/19/20 1640  NA 133*  K 3.9  CL 101  CO2 24  BUN 10  CREATININE 1.12  CALCIUM 8.7*  PROT 6.8  BILITOT 0.8  ALKPHOS 119  ALT 9  AST 23  GLUCOSE 158*   Lab Results  Component Value Date   CHOL 171 03/02/2014   HDL 34 (L) 03/02/2014   LDLCALC 95 03/02/2014   TRIG 212 (H) 03/02/2014   No results found for: DDIMER Invalid input(s): POCBNP  echocardiogram   Radiology Studies: No results found.      Current Facility-Administered Medications (Cardiovascular):  .  metoprolol succinate (TOPROL-XL) 24 hr tablet 12.5 mg  Current Outpatient Medications (Cardiovascular):  .  metoprolol succinate (TOPROL-XL) 25 MG 24 hr tablet, Take 0.5 tablets (12.5 mg total) by mouth daily.    Current Facility-Administered Medications (Analgesics):  .  morphine (MSIR) tablet 30 mg  Current Outpatient Medications (Analgesics):  .  morphine (MSIR) 30 MG tablet, Take 1 tablet (30 mg total) by mouth every 6 (six) hours as needed for up to 5 days for severe pain.  Current Facility-Administered Medications (Hematological):  .  enoxaparin (LOVENOX) injection 40 mg .  ferrous sulfate tablet 325 mg .  vitamin B-12 (CYANOCOBALAMIN) tablet 100 mcg  Current Outpatient Medications (Hematological):  Marland Kitchen  [START ON 02/21/2020] ferrous sulfate 325 (65 FE) MG tablet, Take 1 tablet (325 mg total) by mouth daily with breakfast. .  vitamin B-12 100 MCG tablet, Take 1 tablet (100 mcg total) by mouth daily.  Current Facility-Administered Medications (Other):  .  0.9 %  sodium chloride infusion .  alum & mag hydroxide-simeth (MAALOX/MYLANTA) 200-200-20 MG/5ML suspension 15 mL .   benztropine (COGENTIN) tablet 1 mg .  buPROPion (WELLBUTRIN XL) 24 hr tablet 150 mg .  ciprofloxacin (CIPRO) tablet 750 mg .  cyclobenzaprine (FLEXERIL) tablet 5 mg .  diclofenac Sodium (VOLTAREN) 1 % topical gel 2 g .  gabapentin (NEURONTIN) capsule 300 mg .  lidocaine (LIDODERM) 5 % 1 patch .  lip balm (CARMEX) ointment .  melatonin tablet 5 mg .  mirtazapine (REMERON) tablet 15 mg .  nicotine (NICODERM CQ - dosed in mg/24 hours) patch 14 mg .  ondansetron (ZOFRAN) injection 4 mg .  pantoprazole (PROTONIX) EC tablet 40 mg .  saccharomyces boulardii (FLORASTOR) capsule 250 mg .  senna-docusate (Senokot-S) tablet 1 tablet .  sodium chloride flush (NS) 0.9 % injection 10-40 mL .  sodium chloride flush (NS) 0.9 % injection 10-40 mL .  sodium chloride flush (NS) 0.9 % injection 10-40 mL .  sodium chloride flush (NS) 0.9 % injection 3 mL .  sodium chloride flush (NS) 0.9 % injection 3 mL .  Vitamin D (Ergocalciferol) (DRISDOL) capsule 50,000 Units  Current Outpatient Medications (Other):  Marland Kitchen  buPROPion (WELLBUTRIN XL) 150 MG 24 hr tablet, Take 1 tablet (150 mg total) by mouth daily. .  ciprofloxacin (CIPRO) 750 MG tablet, Take 1 tablet (750 mg total) by mouth 2 (two) times daily for 28 doses. .  nicotine (NICODERM CQ - DOSED IN MG/24 HOURS) 14 mg/24hr patch, Place 1 patch (14 mg total) onto the skin daily. .  pantoprazole (PROTONIX) 40 MG tablet, Take 1 tablet (40 mg total) by mouth daily.  Anti-infectives (From admission, onward)   Start     Dose/Rate Route Frequency Ordered Stop   02/20/20 0000  ciprofloxacin (CIPRO) 750 MG tablet        750 mg Oral 2 times daily 02/20/20 0917 03/05/20 2359   02/19/20 1200  Oritavancin Diphosphate (ORBACTIV) 1,200 mg in dextrose 5 % IVPB        1,200 mg 333.3 mL/hr over 180 Minutes Intravenous Once 02/16/20 1233 02/19/20 1656   02/19/20 0800  ciprofloxacin (CIPRO) tablet 750 mg        750 mg Oral 2 times daily 02/16/20 1233 03/04/20 0759   01/22/20  1430  vancomycin (VANCOCIN) IVPB 1000 mg/200 mL premix        1,000 mg 200 mL/hr over 60 Minutes Intravenous Every 12 hours 01/22/20 1349 02/18/20 1827   01/18/20 1200  vancomycin (VANCOREADY) IVPB 1250 mg/250 mL  Status:  Discontinued        1,250 mg 166.7 mL/hr over 90 Minutes Intravenous Every 12 hours 01/18/20 1125 01/22/20 1349   01/11/20 1400  ceFEPIme (MAXIPIME) 2 g in sodium chloride 0.9 % 100 mL IVPB        2 g 200 mL/hr over 30 Minutes Intravenous Every 8 hours 01/11/20 1013 02/18/20 2217   01/09/20 0400  vancomycin (VANCOREADY) IVPB 1500 mg/300 mL  Status:  Discontinued  1,500 mg 150 mL/hr over 120 Minutes Intravenous Every 12 hours 01/08/20 1900 01/18/20 1125   01/08/20 1900  vancomycin (VANCOREADY) IVPB 1750 mg/350 mL        1,750 mg 175 mL/hr over 120 Minutes Intravenous  Once 01/08/20 1900 01/08/20 2240   01/08/20 1818  cefTRIAXone (ROCEPHIN) 2 g in sodium chloride 0.9 % 100 mL IVPB  Status:  Discontinued        2 g 200 mL/hr over 30 Minutes Intravenous Every 24 hours 01/08/20 1813 01/11/20 1011       Continuous Infusions: . sodium chloride 250 mL (02/19/20 1811)     LOS: 44 days    Gertha Calkin, MD [facility] Pager [phone] How to contact the [facility] Attending or Consulting provider 7A - 7P or covering provider during after hours 7P -7A, for this patient?    1. Check the care team in [facility] and look for a) attending/consulting [facility] provider listed and b) the [facility] team listed 2. Log into [email or web address] and use [place]'s universal password to access. If you do not have the password, please contact the hospital operator. 3. Locate the [facility] provider you are looking for under [facility] and page to a number that you can be directly reached. 4. If you still have difficulty reaching the provider, please page the [facility] (Director on Call) for the Hospitalists listed on amion for assistance. [email or web address] Password [facility] 02/20/2020, 2:06 PM

## 2020-02-20 NOTE — Progress Notes (Signed)
DISCHARGE NOTE HOME [name] to be discharged Home per MD order. Discussed prescriptions and follow up appointments with the patient. Prescriptions given to patient; medication list explained in detail. Patient verbalized understanding.  Skin clean, dry and intact without evidence of skin break down, no evidence of skin tears noted. IV catheter discontinued intact. Site without signs and symptoms of complications. Dressing and pressure applied. Pt denies pain at the site currently. No complaints noted.  Patient free of lines, drains, and wounds.   An After Visit Summary (AVS) was printed and given to the patient. Patient escorted via wheelchair, and discharged home via private auto.  Dolores Hoose, RN

## 2020-02-20 NOTE — Discharge Summary (Addendum)
Physician Discharge Summary  [name] NGE:[id number] DOB: 1966-03-24 DOA: 01/06/2020  PCP: [name], MD  Admit date: 01/06/2020 Discharge date: 02/20/2020   Discharge Diagnoses/Plan: Principal Problem:   Acute osteomyelitis of lumbar spine (HCC) Active Problems:   Cocaine abuse (HCC)   Benign essential HTN   History of CVA (cerebrovascular accident)   Chronic pain syndrome   Closed fracture of one rib of left side   Discitis of lumbar region   Ankle ankylosis, left Assessment & Plan: Recurrent discitis, osteomyelitis L3-4 with associated epidural phlegmon --Continue antibiotics x 6 weeks with Vanc/Cefepime, plan completion November 21. --ESR 38>8>8 --CRP 2.1>0.7>0.6>1.0 --Continue pain management --Repeat MRI lumbar spine w/wo contrast11/17/2021with interval improvement paraspinal inflammatory changes with no residual epidural fluid collection appreciated. --Consulted ID, Dr.Manandhar to updaterecommendations, she states will place note --per ID Dalbavancin after he completes the above treatment when he gets discharged along with 2 weeks worth of ciprofloxacinto complete 8 weeks of treatment given h/o recurrence.  --IV abx and d/c plan per ID A follow up appointment with ID has been made at [facility] clinic on Dec 2 at 1:45 pm.  -- pt is to continue iv abx today is day 39/42 and we will then give pt dalbavancin and 2 weeks of ciprofloxacin. D/c date is planned for 02/21/20.  Large glenohumeral joint effusion thought to be posttraumatic Evaluated by Dr. Otelia Sergeant of [facility] orthopedics ON 02/07/2020. Likely inflammatory fusion right shoulder with avascular necrosis and severe osteoarthritis, not a candidate for surgical joint replacement at this time. --Continue PT/OT efforts --follow-up w/ orthopedics as an outpatient; no surgery before 1 year post-infeciton. --rt shoulder usg neg for dvt.   PMH cocaine use, alcohol use --Counseled earlier in  hospitalization  COPD --remains stable  Schizophrenia --On Invega every 3 months. Continue Cogentin, Wellbutrin and Remeron.  Acute nondisplaced fracture left ninth rib --Supportive care   Discharge Condition:  Good  Diet recommendation:  Heart healthy and low sodium  Filed Weights   02/17/20 1900 02/18/20 2141 02/19/20 2205  Weight: 94.3 kg 87.6 kg 90 kg    Discharge activity: As tolerated   History of present illness:  [name] a 54 year old man PMH discitis April 2021 status post laminectomy, history of schizophrenia, cocaine use presented with shoulder and back pain after mechanical fall. Found to have isolated rib fracture, lumbar progressive discitis and osteomyelitis with septic arthritis at L3-4 and associated epidural phlegmon. Patient evaluated by neurosurgery with recommendation for medical treatment. Seen by infectious disease. Continues on 6 weeks of IV antibiotics. Also developed worsening shoulder pain, seen by orthopedics with conclusion chronic inflammation and recommendation for surgery after a year infection free.  Hospital Course:  Pt admitted on 01/07/20 with  Recurrent septic arthritis of L3-L4 and ns rec of nonsurgical plan and neurosurgery, who commended medical treatment and IR guided disc space biopsy to guide antibiotics..Pt continues to use cocaine but no longer uses alcohol. ID consulted qand pt was started on iv abx  With plan for 6 weeks of abx therapy. Pt had c/o left ankle pain and orthopedic Dr. Lajoyce Corners was consulted who recommended Fibrous nonunion left ankle with no clinical evidence of infection at this time, and Do not feel that surgical intervention is warranted for the left ankle, I will follow-up in the office to evaluate and treat the shoulder issues.IR space biopsy unsuccessful due to DDD and pt cont on vancomycin and cefepime for his vertebral osteomyelitis and discitis with abscess.RECURRENT Discitis/Osteomyelitis and epidural  phlegmon and  abscess - no surgical intervention done. Patient is s/p 6 weeks of empiric antibiotics with vancomycin and ceftriaxone in May-June 2021.Repeat MRI of spine ordered on the 02/14/20 and it shows  Interval improvement in paraspinal inflammatory changes, dural thickening and enhancement. No residual epidural fluid collection. Patient's hospital stay has been stable and uneventful.  Labs remained stable.  Back pain is present however significantly improved.  Imaging report as above.  Follow-up with his primary care provider for persistent or worsening of back pain.  Plan is to discharge patient with 2 weeks of p.o. ciprofloxacin.  And already has infectious disease appointment set up with ID specialist.  Patient completed vancomycin therapy yesterday and is stable for discharge.  Procedures: >>>>>01/08/20 IR BONE MARROW BIOPSY & ASPIRATION  >>>>> 02/14/2020 MRI Lumbar Spine IMPRESSION: 1. Interval improvement in paraspinal inflammatory changes, dural thickening and enhancement. No residual epidural fluid collection. 2. Chronic sequela of discitis and osteomyelitis at L1-2 without progressive endplate destruction, residual endplate edema or abnormal enhancement. 3. Overall improved patency the thecal sac. The greatest remaining spinal stenosis is at L3-4 and is moderate. 4. No acute findings.  Consultations:  IR  Infectious disease  TOC    Discharge Exam: Vitals:   02/19/20 2205 02/20/20 0452  BP: 127/90 (!) 115/96  Pulse: 99 94  Resp: 18 16  Temp: 98.5 F (36.9 C) 98.3 F (36.8 C)  SpO2: 97% 96%    Physical Exam Vitals and nursing note reviewed.  Constitutional:      Appearance: Normal appearance.  HENT:     Head: Normocephalic and atraumatic.     Right Ear: External ear normal.     Left Ear: External ear normal.     Nose: Nose normal.  Eyes:     Extraocular Movements: Extraocular movements intact.     Pupils: Pupils are equal, round, and reactive to light.   Cardiovascular:     Rate and Rhythm: Normal rate and regular rhythm.     Pulses: Normal pulses.     Heart sounds: Normal heart sounds.  Pulmonary:     Effort: Pulmonary effort is normal.     Breath sounds: Normal breath sounds.  Abdominal:     Palpations: Abdomen is soft.  Skin:    General: Skin is warm.  Neurological:     General: No focal deficit present.     Mental Status: He is alert and oriented to person, place, and time.  Psychiatric:        Mood and Affect: Mood normal.        Behavior: Behavior normal.      Discharge Instructions    Allergies as of 02/20/2020      Reactions   Chlorhexidine Itching, Rash      Medication List    STOP taking these medications   diltiazem 240 MG 24 hr capsule Commonly known as: CARDIZEM CD   folic acid 1 MG tablet Commonly known as: FOLVITE   ibuprofen 200 MG tablet Commonly known as: ADVIL   ibuprofen 400 MG tablet Commonly known as: ADVIL   Invega Trinza 819 MG/2.625ML injection Generic drug: Paliperidone Palmitate ER   lidocaine 5 % Commonly known as: LIDODERM   losartan 100 MG tablet Commonly known as: COZAAR   losartan 50 MG tablet Commonly known as: COZAAR   methocarbamol 750 MG tablet Commonly known as: ROBAXIN   omeprazole 40 MG capsule Commonly known as: PRILOSEC Replaced by: pantoprazole 40 MG tablet   polyethylene glycol 17 g packet Commonly  known as: MIRALAX / GLYCOLAX     TAKE these medications   albuterol 108 (90 Base) MCG/ACT inhaler Commonly known as: VENTOLIN HFA Inhale 1 puff into the lungs 2 (two) times daily as needed for wheezing or shortness of breath.   benztropine 1 MG tablet Commonly known as: COGENTIN Take 1 mg by mouth daily.   buPROPion 150 MG 24 hr tablet Commonly known as: WELLBUTRIN XL Take 1 tablet (150 mg total) by mouth daily. What changed:   medication strength  how much to take   cetirizine 10 MG tablet Commonly known as: ZYRTEC TAKE 1 TABLET BY MOUTH  ONCE DAILY. What changed: additional instructions   ciprofloxacin 750 MG tablet Commonly known as: CIPRO Take 1 tablet (750 mg total) by mouth 2 (two) times daily for 28 doses.   cyanocobalamin 100 MCG tablet Take 1 tablet (100 mcg total) by mouth daily.   ferrous sulfate 325 (65 FE) MG tablet Take 1 tablet (325 mg total) by mouth daily with breakfast. Start taking on: February 21, 2020   gabapentin 300 MG capsule Commonly known as: NEURONTIN Take 1 capsule (300 mg total) by mouth 2 (two) times daily.   melatonin 5 MG Tabs Take 5 mg by mouth at bedtime as needed (sleep).   metoprolol succinate 25 MG 24 hr tablet Commonly known as: TOPROL-XL Take 0.5 tablets (12.5 mg total) by mouth daily.   mirtazapine 15 MG tablet Commonly known as: REMERON Take 15 mg by mouth at bedtime.   morphine 30 MG tablet Commonly known as: MSIR Take 1 tablet (30 mg total) by mouth every 6 (six) hours as needed for up to 5 days for severe pain.   nicotine 14 mg/24hr patch Commonly known as: NICODERM CQ - dosed in mg/24 hours Place 1 patch (14 mg total) onto the skin daily.   oxyCODONE 5 MG immediate release tablet Commonly known as: Oxy IR/ROXICODONE Take 1 tablet (5 mg total) by mouth every 6 (six) hours as needed for moderate pain.   pantoprazole 40 MG tablet Commonly known as: PROTONIX Take 1 tablet (40 mg total) by mouth daily. Replaces: omeprazole 40 MG capsule   thiamine 100 MG tablet Take 1 tablet (100 mg total) by mouth daily.   Vitrum Senior Tabs Take 1 tablet by mouth daily.   Xtampza ER 9 MG C12a Generic drug: oxyCODONE ER Take 9 mg by mouth in the morning and at bedtime.      Allergies  Allergen Reactions  . Chlorhexidine Itching and Rash    Follow-up Information    Nadara Mustard, MD Follow up in 1 week(s).   Specialty: Orthopedic Surgery Contact information: [address] [place] [address] [phone]                The results of significant  diagnostics from this hospitalization (including imaging, microbiology, ancillary and laboratory) are listed below for reference.    Significant Diagnostic Studies: MR Lumbar Spine W Wo Contrast  Result Date: 02/14/2020 CLINICAL DATA:  Follow up epidural abscess. History of discitis following laminectomy in April 2021. EXAM: MRI LUMBAR SPINE WITHOUT AND WITH CONTRAST TECHNIQUE: Multiplanar and multiecho pulse sequences of the lumbar spine were obtained without and with intravenous contrast. CONTRAST:  10mL GADAVIST GADOBUTROL 1 MMOL/ML IV SOLN COMPARISON:  Lumbar MRI 01/07/2020 and 08/04/2019 FINDINGS: Segmentation: Conventional anatomy assumed, with the last open disc space designated L5-S1.Concordant with previous imaging. Alignment: Stable. Mild kyphotic angulation at L1-2 and grade 1 retrolisthesis at L1-2 and  L2-3 unchanged. Stable chronic grade 1 anterolisthesis at L4-5. Vertebrae: Stable postsurgical changes status post laminectomy and PLIF at L4-5. Chronic sequela of discitis and osteomyelitis at L1-2 with endplate irregularity, but no progressive endplate destruction, residual significant endplate edema or abnormal enhancement. Interval improved marrow edema and enhancement associated with the right L3-4 facet joint. No evidence of progressive osteomyelitis or acute osseous findings. The visualized sacroiliac joints appear unremarkable. Conus medullaris: Extends to the L1 level and appears normal. Previously demonstrated diffuse dural thickening and enhancement extending from L1 through L5 has improved. There is no residual epidural fluid collection. Paraspinal and other soft tissues: Improved paraspinal inflammatory changes without focal fluid collection. Disc levels: No significant disc space findings from T9-10 through T11-12. T12-L1: Stable mild disc bulging and facet hypertrophy. No significant spinal stenosis. L1-2: Chronic sequela discitis and osteomyelitis with loss of disc height, endplate  irregularity and posterior osteophytes. There is annular disc bulging and mild bilateral facet hypertrophy. These factors contribute to mild spinal stenosis and moderate narrowing of the lateral recesses and foramina bilaterally. No significant residual epidural fluid collection. L2-3: Relatively preserved disc height with annular disc bulging eccentric to the right and mild bilateral facet hypertrophy. Mild spinal stenosis and mild right foraminal narrowing. No residual epidural fluid collection. L3-4: Stable disc bulging eccentric to the right, facet and ligamentous hypertrophy. Stable moderate spinal stenosis and mild narrowing of the lateral recesses and foramina. No residual epidural fluid collection. L4-5: Status post laminectomy and PLIF. The spinal canal remains adequately decompressed. Disc bulging and endplate osteophytes contribute to mild foraminal narrowing bilaterally. No residual epidural fluid collection. L5-S1: The disc appears normal. Mild bilateral facet hypertrophy. No spinal stenosis. IMPRESSION: 1. Interval improvement in paraspinal inflammatory changes, dural thickening and enhancement. No residual epidural fluid collection. 2. Chronic sequela of discitis and osteomyelitis at L1-2 without progressive endplate destruction, residual endplate edema or abnormal enhancement. 3. Overall improved patency the thecal sac. The greatest remaining spinal stenosis is at L3-4 and is moderate. 4. No acute findings. Electronically Signed   By: Carey Bullocks M.D.   On: 02/14/2020 16:50   MR SHOULDER RIGHT W WO CONTRAST  Result Date: 02/03/2020 CLINICAL DATA:  Right shoulder pain after fall 4 days ago. History of osteomyelitis discitis. EXAM: MRI OF THE RIGHT SHOULDER WITHOUT AND WITH CONTRAST TECHNIQUE: Multiplanar, multisequence MR imaging of the right shoulder was performed before and after the administration of intravenous contrast. CONTRAST:  9mL GADAVIST GADOBUTROL 1 MMOL/ML IV SOLN COMPARISON:   Right shoulder x-rays dated January 06, 2020. FINDINGS: Rotator cuff: Chronic full-thickness, full width tears of the supraspinatus and infraspinatus tendons with retraction to the glenoid. Subscapularis tendinosis without tear. The teres minor tendon is unremarkable. Muscles: Moderate supraspinatus and severe infraspinatus muscle atrophy. Mild supraspinatus, infraspinatus, and subscapularis muscle edema. Biceps long head: Intact and normally positioned. Severe tendinosis. Acromioclavicular Joint: Mild arthropathy of the acromioclavicular joint. Type II acromion. Large amount of subacromial/subdeltoid bursal fluid with prominent synovial thickening and enhancement. Glenohumeral Joint: Large joint effusion with prominent synovial thickening and enhancement. Large areas of full-thickness cartilage loss over the humeral head and glenoid. Labrum:  Diffusely degenerated and torn. Bones: Slight depression of the superomedial humeral head with underlying subchondral marrow edema and enhancement extending into the intramedullary proximal humeral metadiaphysis. Patchy marrow edema and enhancement of the glenoid with decreased T1 marrow signal. No acute fracture or dislocation. No suspicious bone lesion. Other: None. IMPRESSION: 1. Large glenohumeral joint effusion with prominent synovial thickening and enhancement, highly  concerning for septic arthritis. 2. Findings suspicious for early osteomyelitis of the proximal humerus and glenoid. 3. Chronic full-thickness, full width tears of the supraspinatus and infraspinatus tendons with retraction to the glenoid. Moderate supraspinatus and severe infraspinatus muscle atrophy. Electronically Signed   By: Obie Dredge M.D.   On: 02/03/2020 16:44   DG FLUORO GUIDED NEEDLE PLC ASPIRATION/INJECTION LOC  Result Date: 02/04/2020 CLINICAL DATA:  Right shoulder effusion. EXAM: RIGHT SHOULDER ASPIRATION UNDER FLUOROSCOPY COMPARISON:  MRI 02/03/2020 FLUOROSCOPY TIME:  Fluoroscopy Time:   42 seconds Radiation Exposure Index (if provided by the fluoroscopic device): 2.9 Number of Acquired Spot Images: 0 PROCEDURE: Overlying skin prepped with Betadine, draped in the usual sterile fashion, and infiltrated locally with XYLOCAINE. Curved 20 gauge spinal needle advanced to the superomedial margin of the right humeral head. 1 ml of Lidocaine injected easily. 28 cc of serosanguineous fluid was removed from the right shoulder joint and sent to the laboratory for analysis. IMPRESSION: 1. Technically successful right shoulder aspiration under fluoroscopy. 2. 28 cc of serosanguineous fluid was aspirated and sent to the lab for studies. Electronically Signed   By: Signa Kell M.D.   On: 02/04/2020 11:44   VAS [place] UPPER EXTREMITY VENOUS DUPLEX  Result Date: 02/18/2020 UPPER VENOUS STUDY  Indications: Pain Comparison Study: no prior Performing Technologist: Blanch Media RVS  Examination Guidelines: A complete evaluation includes B-mode imaging, spectral Doppler, color Doppler, and power Doppler as needed of all accessible portions of each vessel. Bilateral testing is considered an integral part of a complete examination. Limited examinations for reoccurring indications may be performed as noted.  Right Findings: +----------+------------+---------+-----------+----------+-------+ RIGHT     CompressiblePhasicitySpontaneousPropertiesSummary +----------+------------+---------+-----------+----------+-------+ IJV           Full       Yes       Yes                      +----------+------------+---------+-----------+----------+-------+ Subclavian    Full       Yes       Yes                      +----------+------------+---------+-----------+----------+-------+ Axillary      Full       Yes       Yes                      +----------+------------+---------+-----------+----------+-------+ Brachial      Full       Yes       Yes                       +----------+------------+---------+-----------+----------+-------+ Radial        Full                                          +----------+------------+---------+-----------+----------+-------+ Ulnar         Full                                          +----------+------------+---------+-----------+----------+-------+ Cephalic      Full                                          +----------+------------+---------+-----------+----------+-------+  Basilic       Full                                          +----------+------------+---------+-----------+----------+-------+  Summary:  Right: No evidence of deep vein thrombosis in the upper extremity. No evidence of superficial vein thrombosis in the upper extremity. No evidence of thrombosis in the subclavian.  *See table(s) above for measurements and observations.  Diagnosing physician: Waverly Ferrari MD Electronically signed by Waverly Ferrari MD on 02/18/2020 at 6:20:30 PM.    Final    [place] EKG SITE RITE  Result Date: 02/12/2020 If Site Rite image not attached, placement could not be confirmed due to current cardiac rhythm.   Microbiology: No results found for this or any previous visit (from the past 240 hour(s)).   Labs: Basic Metabolic Panel: Recent Labs  Lab 02/15/20 0405 02/16/20 0327 02/17/20 0350 02/19/20 1640  NA 138  --  135 133*  K 4.0  --  4.0 3.9  CL 106  --  100 101  CO2 25  --  25 24  GLUCOSE 99  --  104* 158*  BUN 10  --  9 10  CREATININE 1.22 0.98 1.04 1.12  CALCIUM 9.1  --  9.1 8.7*   Liver Function Tests: Recent Labs  Lab 02/19/20 1640  AST 23  ALT 9  ALKPHOS 119  BILITOT 0.8  PROT 6.8  ALBUMIN 3.6   No results for input(s): LIPASE, AMYLASE in the last 168 hours. No results for input(s): AMMONIA in the last 168 hours. CBC: Recent Labs  Lab 02/19/20 1640  WBC 8.2  HGB 11.5*  HCT 37.4*  MCV 86.6  PLT 269   Cardiac Enzymes: No results for input(s): CKTOTAL, CKMB,  CKMBINDEX, TROPONINI in the last 168 hours. BNP: BNP (last 3 results) Recent Labs    07/25/19 2346  BNP 49.1    ProBNP (last 3 results) No results for input(s): PROBNP in the last 8760 hours.  CBG: No results for input(s): GLUCAP in the last 168 hours.    Time spent: 35 minutes  Signed:  Gertha Calkin MD.  [facility] 02/20/2020, 9:18 AM

## 2020-02-20 NOTE — Plan of Care (Signed)
  Problem: Clinical Measurements: Goal: Will remain free from infection 02/20/2020 1127 by Dolores Hoose, RN Outcome: Adequate for Discharge 02/20/2020 0755 by Dolores Hoose, RN Outcome: Progressing   Problem: Pain Managment: Goal: General experience of comfort will improve Outcome: Adequate for Discharge   Problem: Acute Rehab PT Goals(only PT should resolve) Goal: Pt Will Ambulate Outcome: Adequate for Discharge   Problem: Acute Rehab PT Goals(only PT should resolve) Goal: Pt/caregiver will Perform Home Exercise Program Outcome: Adequate for Discharge   Problem: Acute Rehab OT Goals (only OT should resolve) Goal: Pt/Caregiver Will Perform Home Exercise Program Outcome: Adequate for Discharge

## 2020-02-20 NOTE — Progress Notes (Signed)
Occupational Therapy Treatment Patient Details Name: [name] MRN: [id number] DOB: 10/22/1965 Today's Date: 02/20/2020    History of present illness 54 y/o male with PMH of discitis April 2021, s/p laminectormy, schizophrenia, cocaine use, presenting with mechanical fall in the shower with shoulder and back pain. Imaging reveals: progresive discitits osteomyelitis of LS spine, Septic arthriits at L3-4, spinal stenosis L1-2 and improved discitis at L1-2.  Underwent apsiration/core biopsy 10/11, neurosurgery recommends medical tx. Acute non displaced Fx of anterolateral L 9th rib; R shoulder with no acute findings.  Complains of R shoulder pain, OT ordered again 11/10.  Found with moderatelysevere osteoarthritis right glenohumeral joint.    OT comments  Pt making steady progress towards OT goals this session. Session focus on continued reiteration of RUE therex. Reissued pt written HEP to increase carryover. Pt completed therex as indicated below, pt does report increased pain in R shoulder during therex. Education on completing therex within pts pain tolerance. Pt did complete simulated tub shower transfer with min guard for safety, pt reports using shower rod to hold onto when stepping into shower. Education on safer option of using suction grab bars for balance with pt verbalizing understanding of safety implications related to DME as well as where to purchase, per RN pt likely to DC later today. DC plan remains appropriate, will follow acutely per POC.    Follow Up Recommendations  Outpatient OT;Supervision - Intermittent    Equipment Recommendations  3 in 1 bedside commode    Recommendations for Other Services      Precautions / Restrictions Precautions Precautions: None Restrictions Weight Bearing Restrictions: No       Mobility Bed Mobility Overal bed mobility: Independent             General bed mobility comments: sitting EOB upon arrival and returned to EOB at end of  session  Transfers Overall transfer level: Independent Equipment used: None Transfers: Sit to/from Stand Sit to Stand: Modified independent (Device/Increase time)         General transfer comment: no physical asssit needed    Balance Overall balance assessment: Needs assistance Sitting-balance support: Feet supported Sitting balance-[name]: Good     Standing balance support: During functional activity;Single extremity supported Standing balance-[name]: Fair Standing balance comment: single extremity supported during simulated shower transfers                           ADL either performed or assessed with clinical judgement   ADL Overall ADL's : Needs assistance/impaired                     Lower Body Dressing: Supervision/safety Lower Body Dressing Details (indicate cue type and reason): pt able to adjust socks from EOB with supervison Toilet Transfer: Supervision/safety;Ambulation Toilet Transfer Details (indicate cue type and reason): gross supervision with no AD     Tub/ Shower Transfer: Min guard;Tub transfer;Grab bars Tub/Shower Transfer Details (indicate cue type and reason): simulated in room; min guard for pt to step over tub. pt reports using shower rod to hold onto when stepping in <>out of tub. education on getting suction grab bars to assist with safety and decrease fall risk- showed pt picture of DME and provided education on where to purchase. education on safety implications related to using suction grab bars as well as proper set- up of DME Functional mobility during ADLs: Independent General ADL Comments: continued work on R shoulder exercises,  tub shower transfer and education related to DME     Vision       Perception     Praxis      Cognition Arousal/Alertness: Awake/alert Behavior During Therapy: WFL for tasks assessed/performed Overall Cognitive Status: Within Functional Limits for tasks assessed                                  General Comments: fixated on wanting to DC home today        Exercises Other Exercises Other Exercises: completed HEP for R shoulder including shoulder elevation, shoulder retraction, lap/table slides  [id number] Medbridge; requires min cueing for technique and full ROM on R side; 2 sets x 10 reps     Shoulder Instructions       General Comments reissued written HEP as pt reports "can't find it"    Pertinent Vitals/ Pain       Pain Assessment: Faces Faces Pain Scale: Hurts a little bit Pain Location: R ankle with mobility Pain Descriptors / Indicators: Guarding;Discomfort Pain Intervention(s): Monitored during session  Home Living                                          Prior Functioning/Environment              Frequency  Min 2X/week        Progress Toward Goals  OT Goals(current goals can now be found in the care plan section)  Progress towards OT goals: Progressing toward goals  Acute Rehab OT Goals Patient Stated Goal: less pain OT Goal Formulation: With patient Time For Goal Achievement: 02/22/20 Potential to Achieve Goals: Good  Plan Discharge plan remains appropriate;Frequency remains appropriate    Co-evaluation                 AM-PAC OT "6 Clicks" Daily Activity     Outcome Measure   Help from another person eating meals?: None Help from another person taking care of personal grooming?: None Help from another person toileting, which includes using toliet, bedpan, or urinal?: None Help from another person bathing (including washing, rinsing, drying)?: None Help from another person to put on and taking off regular upper body clothing?: None Help from another person to put on and taking off regular lower body clothing?: None 6 Click Score: 24    End of Session    OT Visit Diagnosis: Pain Pain - Right/Left: Right Pain - part of body: Shoulder   Activity Tolerance Patient tolerated  treatment well   Patient Left in bed;with call bell/phone within reach;Other (comment) (sitting EOB)   Nurse Communication Mobility status        Time: 1610-9604 OT Time Calculation (min): 11 min  Charges: OT General Charges $OT Visit: 1 Visit OT Treatments $Therapeutic Exercise: 8-22 mins  Audery Amel., COTA/L Acute Rehabilitation Services [phone]    Angelina Pih 02/20/2020, 9:54 AM

## 2020-02-20 NOTE — Plan of Care (Signed)
  Problem: Clinical Measurements: Goal: Will remain free from infection Outcome: Progressing   Problem: Pain Managment: Goal: General experience of comfort will improve Outcome: Progressing   

## 2020-02-20 NOTE — TOC Transition Note (Signed)
Transition of Care [facility]) - CM/SW Discharge Note   Patient Details  Name: [name] MRN: [id number] Date of Birth: August 30, 1965  [facility] CM/SW Contact:  Bartholomew Crews, RN Phone Number: [phone] 02/20/2020, 2:23 PM   Clinical Narrative:     Notified by nursing that patient was ready to discharge but needed transportation. Arrangements made with [facility] transportation. No further TOC needs identified at this time.   Final next level of care: Home/Self Care Barriers to Discharge: No Barriers Identified   Patient Goals and CMS Choice Patient states their goals for this hospitalization and ongoing recovery are:: return home CMS Medicare.gov Compare Post Acute Care list provided to:: Patient Choice offered to / list presented to : NA  Discharge Placement                       Discharge Plan and Services   Discharge Planning Services: CM Consult Post Acute Care Choice: NA          DME Arranged: N/A DME Agency: NA       HH Arranged: NA HH Agency: NA        Social Determinants of Health (SDOH) Interventions     Readmission Risk Interventions Readmission Risk Prevention Plan 08/21/2019  Transportation Screening Complete  Medication Review Press photographer) Complete  HRI or [place] Complete  SW Recovery Care/Counseling Consult Complete  [place] Not Applicable  Some recent data might be hidden

## 2020-02-20 NOTE — Progress Notes (Addendum)
PROGRESS NOTE    [name]  LOV:[id number] DOB: [birth year] DOA: 01/06/2020  PCP: [name], MD    Brief Narrative:  [name] is a 54 year old man PMH discitis April 2021 status post laminectomy, history of schizophrenia, cocaine use presented with shoulder and back pain after mechanical fall. Found to have isolated rib fracture, lumbar progressive discitis and osteomyelitis with septic arthritis at L3-4 and associated epidural phlegmon. Patient evaluated by neurosurgery with recommendation for medical treatment. Seen by infectious disease. Continues on 6 weeks of IV antibiotics. Also developed worsening shoulder pain, seen by orthopedics with conclusion chronic inflammation and recommendation for surgery after a year infection free.   Assessment & Plan: Recurrent discitis, osteomyelitis L3-4 with associated epidural phlegmon --Continue antibiotics x 6 weeks with Vanc/Cefepime, plan completion November 21. --ESR 38>8>8 --CRP 2.1>0.7>0.6>1.0 --Continue pain management --Repeat MRI lumbar spine w/wo contrast 02/14/2020 with interval improvement paraspinal inflammatory changes with no residual epidural fluid collection appreciated. --Consulted ID, Dr. Elinor Parkinson to update recommendations, she states will place note --per ID Dalbavancin after he completes the above treatment when he gets discharged along with 2 weeks worth of ciprofloxacin to complete 8 weeks of treatment given h/o recurrence.  --IV abx and d/c plan per ID A follow up appointment with ID has been made at [facility] clinic on Dec 2 at 1:45 pm.  -- pt is to continue iv abx today is day 39/42 and we will then give pt dalbavancin and 2 weeks of ciprofloxacin. D/c date is planned for 02/21/20.  Large glenohumeral joint effusion thought to be posttraumatic Evaluated by Dr. Otelia Sergeant of [facility] orthopedics ON 02/07/2020.  Likely inflammatory fusion right shoulder with avascular necrosis and severe osteoarthritis, not a  candidate for surgical joint replacement at this time. --Continue PT/OT efforts --follow-up w/ orthopedics as an outpatient; no surgery before 1 year post-infeciton. --rt shoulder usg neg for dvt.   PMH cocaine use, alcohol use --Counseled earlier in hospitalization  COPD --remains stable  Schizophrenia --On Invega every 3 months. Continue Cogentin, Wellbutrin and Remeron.  Acute nondisplaced fracture left ninth rib --Supportive care   DVT prophylaxis: Lovenox, SCDs, ambulation Code Status: Full code Family Communication: None present at bedside this morning  Disposition Plan:  Status is: Inpatient  Remains inpatient appropriate because:Unsafe d/c plan, IV treatments appropriate due to intensity of illness or inability to take PO and Inpatient level of care appropriate due to severity of illness   Dispo: The patient is from: Home  Anticipated d/c is to: Home  Anticipated d/c date is: > 3 days  Patient currently is not medically stable to d/c.   Consultants:   Infectious disease  Neurosurgery  Orthopedics, Dr. Otelia Sergeant  Interventional radiology  Procedures:   Unsuccessful disc aspiration lumbar spine    Subjective/Overview: Patient seen and examined at bedside, lying in bed.  No complaints this morning.  Discussed MRI findings with patient this morning with no residual epidural fluid collection.  Reconsulted ID for any further recommendations.  Denies headache, no fever/chills/night sweats, no nausea/vomiting/diarrhea, no chest pain, palpitations, no shortness of breath, no abdominal pain, no weakness, no fatigue, no paresthesia.  No acute events overnight per nursing staff.  02/16/2020 Pt seen today for f/u and he is resting comfortably and reports back pain.  He is keen to go home. Vitals are stable and pt is afebrile. Labs are stable. Plan is to cont with iv abx per ID plan and d/c as below: Todays is 36/42 days  of IV abx.  Please give him a dose of Dalbavancin after he completes the above treatment when he gets discharged along with 2 weeks worth of ciprofloxacin to complete 8 weeks of treatment given h/o recurrence.  F/u with IC on December 2,2021 at 1:45 pm.   02/17/20 Pt is alert,awake and denies any complaints. 02/18/20 Pt is alert and reports rt shoulder pain and otherwise no complaints.  usg is negative.   02/18/20 Pt seen and he is stable no change cont with iv abx back pain rt shoulder, Venous doppler negative.   Objective: Vitals:   02/19/20 1655 02/19/20 2205 02/20/20 0452 02/20/20 0936  BP: (!) 146/96 127/90 (!) 115/96 (!) 126/92  Pulse: 93 99 94 98  Resp: 18 18 16 18   Temp: 97.8 F (36.6 C) 98.5 F (36.9 C) 98.3 F (36.8 C) 97.7 F (36.5 C)  TempSrc:  Oral  Oral  SpO2: 99% 97% 96% 98%  Weight:  90 kg    Height:       SpO2: 98 % O2 Flow Rate (L/min): 2 L/min  Intake/Output Summary (Last 24 hours) at 02/20/2020 1404 Last data filed at 02/20/2020 0900 Gross per 24 hour  Intake 1600.27 ml  Output 0 ml  Net 1600.27 ml   Filed Weights   02/17/20 1900 02/18/20 2141 02/19/20 2205  Weight: 94.3 kg 87.6 kg 90 kg    Examination: Blood pressure (!) 126/92, pulse 98, temperature 97.7 F (36.5 C), temperature source Oral, resp. rate 18, height 5\' 9"  (1.753 m), weight 90 kg, SpO2 98 %. General exam: Appears calm and comfortable  HEENT:EOMI, perrl  Respiratory system: Clear to auscultation. Respiratory effort normal. Cardiovascular system: S1 & S2 heard, RRR. No JVD, murmurs, rubs, gallops or clicks. No pedal edema. Gastrointestinal system: Abdomen is nondistended, soft and nontender. No organomegaly or masses felt. Normal bowel sounds heard. Central nervous system: Alert and oriented.CN grossly intact No focal neurological deficits. Extremities: pt moving all 4 ext and ambulating. Skin: No rashes, lesions or ulcers Psychiatry: Judgement and insight appear normal. Mood &  affect appropriate.     Data Reviewed: I have personally reviewed following labs and imaging studies  Labs  No results for input(s): CKTOTAL, CKMB, TROPONINI in the last 72 hours. Lab Results  Component Value Date   WBC 8.2 02/19/2020   HGB 11.5 (L) 02/19/2020   HCT 37.4 (L) 02/19/2020   MCV 86.6 02/19/2020   PLT 269 02/19/2020    Recent Labs  Lab 02/19/20 1640  NA 133*  K 3.9  CL 101  CO2 24  BUN 10  CREATININE 1.12  CALCIUM 8.7*  PROT 6.8  BILITOT 0.8  ALKPHOS 119  ALT 9  AST 23  GLUCOSE 158*   Lab Results  Component Value Date   CHOL 171 03/02/2014   HDL 34 (L) 03/02/2014   LDLCALC 95 03/02/2014   TRIG 212 (H) 03/02/2014   No results found for: DDIMER Invalid input(s): POCBNP  echocardiogram   Radiology Studies: No results found.      Current Facility-Administered Medications (Cardiovascular):  .  metoprolol succinate (TOPROL-XL) 24 hr tablet 12.5 mg  Current Outpatient Medications (Cardiovascular):  .  metoprolol succinate (TOPROL-XL) 25 MG 24 hr tablet, Take 0.5 tablets (12.5 mg total) by mouth daily.    Current Facility-Administered Medications (Analgesics):  .  morphine (MSIR) tablet 30 mg  Current Outpatient Medications (Analgesics):  .  morphine (MSIR) 30 MG tablet, Take 1 tablet (30 mg total) by mouth every 6 (six) hours as needed  for up to 5 days for severe pain.  Current Facility-Administered Medications (Hematological):  .  enoxaparin (LOVENOX) injection 40 mg .  ferrous sulfate tablet 325 mg .  vitamin B-12 (CYANOCOBALAMIN) tablet 100 mcg  Current Outpatient Medications (Hematological):  Marland Kitchen  [START ON 02/21/2020] ferrous sulfate 325 (65 FE) MG tablet, Take 1 tablet (325 mg total) by mouth daily with breakfast. .  vitamin B-12 100 MCG tablet, Take 1 tablet (100 mcg total) by mouth daily.  Current Facility-Administered Medications (Other):  .  0.9 %  sodium chloride infusion .  alum & mag hydroxide-simeth (MAALOX/MYLANTA)  200-200-20 MG/5ML suspension 15 mL .  benztropine (COGENTIN) tablet 1 mg .  buPROPion (WELLBUTRIN XL) 24 hr tablet 150 mg .  ciprofloxacin (CIPRO) tablet 750 mg .  cyclobenzaprine (FLEXERIL) tablet 5 mg .  diclofenac Sodium (VOLTAREN) 1 % topical gel 2 g .  gabapentin (NEURONTIN) capsule 300 mg .  lidocaine (LIDODERM) 5 % 1 patch .  lip balm (CARMEX) ointment .  melatonin tablet 5 mg .  mirtazapine (REMERON) tablet 15 mg .  nicotine (NICODERM CQ - dosed in mg/24 hours) patch 14 mg .  ondansetron (ZOFRAN) injection 4 mg .  pantoprazole (PROTONIX) EC tablet 40 mg .  saccharomyces boulardii (FLORASTOR) capsule 250 mg .  senna-docusate (Senokot-S) tablet 1 tablet .  sodium chloride flush (NS) 0.9 % injection 10-40 mL .  sodium chloride flush (NS) 0.9 % injection 10-40 mL .  sodium chloride flush (NS) 0.9 % injection 10-40 mL .  sodium chloride flush (NS) 0.9 % injection 3 mL .  sodium chloride flush (NS) 0.9 % injection 3 mL .  Vitamin D (Ergocalciferol) (DRISDOL) capsule 50,000 Units  Current Outpatient Medications (Other):  Marland Kitchen  buPROPion (WELLBUTRIN XL) 150 MG 24 hr tablet, Take 1 tablet (150 mg total) by mouth daily. .  ciprofloxacin (CIPRO) 750 MG tablet, Take 1 tablet (750 mg total) by mouth 2 (two) times daily for 28 doses. .  nicotine (NICODERM CQ - DOSED IN MG/24 HOURS) 14 mg/24hr patch, Place 1 patch (14 mg total) onto the skin daily. .  pantoprazole (PROTONIX) 40 MG tablet, Take 1 tablet (40 mg total) by mouth daily.  Anti-infectives (From admission, onward)   Start     Dose/Rate Route Frequency Ordered Stop   02/20/20 0000  ciprofloxacin (CIPRO) 750 MG tablet        750 mg Oral 2 times daily 02/20/20 0917 03/05/20 2359   02/19/20 1200  Oritavancin Diphosphate (ORBACTIV) 1,200 mg in dextrose 5 % IVPB        1,200 mg 333.3 mL/hr over 180 Minutes Intravenous Once 02/16/20 1233 02/19/20 1656   02/19/20 0800  ciprofloxacin (CIPRO) tablet 750 mg        750 mg Oral 2 times daily  02/16/20 1233 03/04/20 0759   01/22/20 1430  vancomycin (VANCOCIN) IVPB 1000 mg/200 mL premix        1,000 mg 200 mL/hr over 60 Minutes Intravenous Every 12 hours 01/22/20 1349 02/18/20 1827   01/18/20 1200  vancomycin (VANCOREADY) IVPB 1250 mg/250 mL  Status:  Discontinued        1,250 mg 166.7 mL/hr over 90 Minutes Intravenous Every 12 hours 01/18/20 1125 01/22/20 1349   01/11/20 1400  ceFEPIme (MAXIPIME) 2 g in sodium chloride 0.9 % 100 mL IVPB        2 g 200 mL/hr over 30 Minutes Intravenous Every 8 hours 01/11/20 1013 02/18/20 2217   01/09/20 0400  vancomycin (  VANCOREADY) IVPB 1500 mg/300 mL  Status:  Discontinued        1,500 mg 150 mL/hr over 120 Minutes Intravenous Every 12 hours 01/08/20 1900 01/18/20 1125   01/08/20 1900  vancomycin (VANCOREADY) IVPB 1750 mg/350 mL        1,750 mg 175 mL/hr over 120 Minutes Intravenous  Once 01/08/20 1900 01/08/20 2240   01/08/20 1818  cefTRIAXone (ROCEPHIN) 2 g in sodium chloride 0.9 % 100 mL IVPB  Status:  Discontinued        2 g 200 mL/hr over 30 Minutes Intravenous Every 24 hours 01/08/20 1813 01/11/20 1011       Continuous Infusions: . sodium chloride 250 mL (02/19/20 1811)     LOS: 44 days    Gertha Calkin, MD [facility] Pager [phone] How to contact the [facility] Attending or Consulting provider 7A - 7P or covering provider during after hours 7P -7A, for this patient?    1. Check the care team in [facility] and look for a) attending/consulting [facility] provider listed and b) the [facility] team listed 2. Log into [email or web address] and use [place]'s universal password to access. If you do not have the password, please contact the hospital operator. 3. Locate the [facility] provider you are looking for under [facility] and page to a number that you can be directly reached. 4. If you still have difficulty reaching the provider, please page the [facility] (Director on Call) for the Hospitalists listed on amion for assistance. [email or web address] Password  [facility] 02/20/2020, 2:04 PM

## 2020-02-29 ENCOUNTER — Inpatient Hospital Stay: Payer: Medicare Other | Admitting: Infectious Diseases

## 2020-03-04 ENCOUNTER — Inpatient Hospital Stay: Payer: Medicare Other | Admitting: Infectious Diseases

## 2020-03-04 ENCOUNTER — Telehealth: Payer: Self-pay

## 2020-03-04 ENCOUNTER — Other Ambulatory Visit: Payer: Self-pay | Admitting: Infectious Diseases

## 2020-03-04 MED ORDER — CIPROFLOXACIN HCL 750 MG PO TABS: 750.0000 mg | ORAL_TABLET | Freq: Two times a day (BID) | ORAL | 0 refills | Status: DC

## 2020-03-04 NOTE — Addendum Note (Signed)
Addended by: Eugenia Mcalpine on: 03/04/2020 03:36 PM   Modules accepted: Orders

## 2020-03-04 NOTE — Telephone Encounter (Signed)
Received transferred call from front desk. Patient rescheduled his hospital follow up appointment and inquired about antibiotic refills. Patient advised that refills will be determined during his office visit and patient advised to keep follow up visit. Routing to MD for approval for additional refills prior to appointment. Eugenia Mcalpine

## 2020-03-04 NOTE — Telephone Encounter (Signed)
Please send a script of Ciprofloxacin 750mg  PO BID for3 days until appt.

## 2020-03-07 ENCOUNTER — Encounter: Payer: Self-pay | Admitting: Infectious Diseases

## 2020-03-07 ENCOUNTER — Other Ambulatory Visit: Payer: Self-pay

## 2020-03-07 ENCOUNTER — Ambulatory Visit (INDEPENDENT_AMBULATORY_CARE_PROVIDER_SITE_OTHER): Payer: Medicare Other | Admitting: Infectious Diseases

## 2020-03-07 VITALS — BP 142/98 | HR 108 | Temp 97.8°F | Wt 194.0 lb

## 2020-03-07 DIAGNOSIS — M4640 Discitis, unspecified, site unspecified: Secondary | ICD-10-CM

## 2020-03-07 DIAGNOSIS — G8929 Other chronic pain: Secondary | ICD-10-CM

## 2020-03-07 DIAGNOSIS — M5440 Lumbago with sciatica, unspecified side: Secondary | ICD-10-CM

## 2020-03-07 NOTE — Progress Notes (Signed)
[name]                                                             [name], [name], [place], [address]                                                                  Phn. [phone]; Fax: [phone]                                                                             Date: 03/07/2020   Reason for Referral: HFU for discitis and Osteomyelitis   Assessment RECURRENT Discitis/Osteomyelitis and epidural phlegmon and abscess - no surgical intervention done. - Patient is s/p 6 weeks of empiric antibiotics with vancomycin and ceftriaxone in May-June 2021. -Patient completed approx 6 weeks of IV Vancomycin and cefepime until 11/23 in the hospital followed by one dose of Oritavancin on 11/22 and completed 2 weeks of Ciprofloxacin on 12/6  RT shoulder AVN and Severe OA - Management per Ortho   Rt knee Arthroplasty in 2018 - no issues currently   HepC ab positive with negative HCV RNA - no active infection, does not need Tx.   Recommendations Patient completed approx 8 weeks of treatment for discitis and osteomyelitis. Repeat MRI of the spine in showed no residual epidural fluid collection and improvement in the inflammatory changes. His inflammatory markers last checked on 02/20/20 has normalized. He has completed adequate course of antibiotics at this point Repeat ESR and CRP WNL  Wil refer to pain management Fu PRN   All questions and concerns were discussed and addressed. Patient verbalized understanding of the plan. ____________________________________________________________________________________________________________________ [name] is a 54 y.o. male with a PMH of Polysubstance abuse, prior h/o L1-L2 discitis and osteomyelitis s/p treatment with IV Vancomycin and cefepime for 6 weeks admitted recently at [facility]10-11[facility]23/21 for recurrent T-L  discitis and osteomyelitis and epidural phlegmon. Patient completed almost 6 weeks of IV vancomycin and cefepime in the hospital and was discharged with one dose of Oritavancin on 11/22 and 2 weeks course of Ciprofloxacin. Repeat MRI spine 11/18 showed no residual epidural abscess and improvement in the discitis and osteomyelitis. Inflmamatory markers also trended down to normal.   Patient is here for a follow up. He says his back hurts. Its 10/10 and worse. Denies any fever, chills, sweats. Denies any new neurological symptoms like urinary or bowel incontinence, new weakness, numbness and tingling. He complains of shooting pain in his bilateral legs which he had before. He is able to walk. He is taking Ibuprofen 4 times a day for pain. He just completed 2 weeks of ciptofloxacin few days go.    ROS: Constitutional: Negative for fever, chills, activity change, appetite change, fatigue  and unexpected weight change.  HENT: Negative for congestion, sore throat, rhinorrhea, sneezing, trouble swallowing and sinus pressure.  Eyes: Negative for photophobia and visual disturbance.  Respiratory: Negative for cough, chest tightness, shortness of breath, wheezing and stridor.  Cardiovascular: Negative for chest pain, palpitations and leg swelling.  Gastrointestinal: Negative for nausea, vomiting, abdominal pain, diarrhea, constipation, blood in stool, abdominal distention and anal bleeding.  Genitourinary: Negative for dysuria, hematuria, flank pain and difficulty urinating.  Musculoskeletal: Negative for myalgias, joint swelling, arthralgias and gait problem. BACK PAIN+ Skin: Negative for color change, pallor, rash and wound.  Neurological: Negative for dizziness, tremors, weakness and light-headedness.  Hematological: Negative for adenopathy. Does not bruise/bleed easily.  Psychiatric/Behavioral: Negative for behavioral problems, confusion, sleep disturbance, dysphoric mood, decreased concentration and  agitation.   Past Medical History:  Diagnosis Date  . Anxiety   . Arthritis   . Asthma   . Depression   . Duodenal adenoma   . GERD (gastroesophageal reflux disease)   . Hypertension   . Pneumonia    hx  . Post-traumatic osteoarthritis of left ankle   . Pre-diabetes   . Presence of right artificial knee joint 12/17/2016  . Schizophrenia (HCC)   . Stroke [facility])    2009-or10   Past Surgical History:  Procedure Laterality Date  . ANKLE ARTHROSCOPY Left 10/20/2017   Procedure: ANKLE ARTHROSCOPY;  Surgeon: Nadara Mustard, MD;  Location: [facility] OR;  Service: Orthopedics;  Laterality: Left;  . ANKLE FUSION Left 10/20/2017  . ANKLE FUSION Left 10/20/2017   Procedure: LEFT ANKLE FUSION;  Surgeon: Nadara Mustard, MD;  Location: [facility] OR;  Service: Orthopedics;  Laterality: Left;  . ESOPHAGOGASTRODUODENOSCOPY Left 12/05/2013   Procedure: ESOPHAGOGASTRODUODENOSCOPY (EGD);  Surgeon: Willis Modena, MD;  Location: [facility]COPY;  Service: Endoscopy;  Laterality: Left;  . ESOPHAGOGASTRODUODENOSCOPY (EGD) WITH PROPOFOL N/A 02/20/2015   Procedure: ESOPHAGOGASTRODUODENOSCOPY (EGD) WITH PROPOFOL;  Surgeon: Charlott Rakes, MD;  Location: [facility] ENDOSCOPY;  Service: Endoscopy;  Laterality: N/A;  . HARDWARE REMOVAL Left 01/29/2018   Procedure: HARDWARE REMOVAL LEFT ANKLE;  Surgeon: Nadara Mustard, MD;  Location: [facility] OR;  Service: Orthopedics;  Laterality: Left;  . IR LUMBAR DISC ASPIRATION W/IMG GUIDE  01/08/2020  . JOINT REPLACEMENT    . TOTAL KNEE ARTHROPLASTY Right 07/21/2016   Procedure: TOTAL KNEE ARTHROPLASTY;  Surgeon: Cammy Copa, MD;  Location: [facility] OR;  Service: Orthopedics;  Laterality: Right;  . UPPER GASTROINTESTINAL ENDOSCOPY     Current Outpatient Medications on File Prior to Visit  Medication Sig Dispense Refill  . albuterol (PROVENTIL HFA;VENTOLIN HFA) 108 (90 Base) MCG/ACT inhaler Inhale 1 puff into the lungs 2 (two) times daily as needed for wheezing or shortness of breath. 1 Inhaler 1  .  benztropine (COGENTIN) 1 MG tablet Take 1 mg by mouth daily.    Marland Kitchen buPROPion (WELLBUTRIN XL) 150 MG 24 hr tablet Take 1 tablet (150 mg total) by mouth daily. 30 tablet 0  . cetirizine (ZYRTEC) 10 MG tablet TAKE 1 TABLET BY MOUTH ONCE DAILY. (Patient taking differently: Take 10 mg by mouth daily. No Therapeutic Substitution) 28 tablet 11  . ciprofloxacin (CIPRO) 750 MG tablet Take 1 tablet (750 mg total) by mouth 2 (two) times daily for 3 days. 6 tablet 0  . ferrous sulfate 325 (65 FE) MG tablet Take 1 tablet (325 mg total) by mouth daily with breakfast. 30 tablet 0  . gabapentin (NEURONTIN) 300 MG capsule Take 1 capsule (300 mg total) by mouth 2 (two)  times daily. 180 capsule 4  . Melatonin 5 MG TABS Take 5 mg by mouth at bedtime as needed (sleep).     . metoprolol succinate (TOPROL-XL) 25 MG 24 hr tablet Take 0.5 tablets (12.5 mg total) by mouth daily. 15 tablet 0  . mirtazapine (REMERON) 15 MG tablet Take 15 mg by mouth at bedtime.    . Multiple Vitamins-Minerals (VITRUM SENIOR) TABS Take 1 tablet by mouth daily.     . nicotine (NICODERM CQ - DOSED IN MG/24 HOURS) 14 mg/24hr patch Place 1 patch (14 mg total) onto the skin daily. 28 patch 0  . oxyCODONE ER (XTAMPZA ER) 9 MG C12A Take 9 mg by mouth in the morning and at bedtime. 14 capsule 0  . pantoprazole (PROTONIX) 40 MG tablet Take 1 tablet (40 mg total) by mouth daily. 30 tablet 0  . thiamine 100 MG tablet Take 1 tablet (100 mg total) by mouth daily. 30 tablet 0  . vitamin B-12 100 MCG tablet Take 1 tablet (100 mcg total) by mouth daily. 30 tablet 0  . oxyCODONE (OXY IR/ROXICODONE) 5 MG immediate release tablet Take 1 tablet (5 mg total) by mouth every 6 (six) hours as needed for moderate pain. (Patient not taking: Reported on 03/07/2020) 14 tablet 0   No current facility-administered medications on file prior to visit.   Allergies  Allergen Reactions  . Chlorhexidine Itching and Rash   Social History   Socioeconomic History  . Marital  status: Significant Other    Spouse name: Not on file  . Number of children: Not on file  . Years of education: Not on file  . Highest education level: Not on file  Occupational History  . Not on file  Tobacco Use  . Smoking status: Heavy Tobacco Smoker    Packs/day: 1.50    Years: 38.00    Pack years: 57.00    Types: Cigarettes  . Smokeless tobacco: Never Used  . Tobacco comment: onset age 64; upto 1.5 ppd  Vaping Use  . Vaping Use: Never used  Substance and Sexual Activity  . Alcohol use: Not Currently    Alcohol/week: 3.0 standard drinks    Types: 3 Cans of beer per week    Comment: quit 2015; formerly up to 2 fifths/day  . Drug use: No  . Sexual activity: Yes    Comment: declined condoms  Other Topics Concern  . Not on file  Social History Narrative  . Not on file   Social Determinants of Health   Financial Resource Strain: Not on file  Food Insecurity: Not on file  Transportation Needs: Not on file  Physical Activity: Not on file  Stress: Not on file  Social Connections: Not on file  Intimate Partner Violence: Not on file    Vitals BP (!) 142/98   Pulse (!) 108   Temp 97.8 F (36.6 C) (Oral)   Wt 194 lb (88 kg)   BMI 28.65 kg/m    Examination  General - not in acute distress, comfortably sitting in chair HEENT - PEERLA, no pallor and no icterus Chest - b/l clear air entry, no additional sounds CVS- Normal s1s2, RRR Abdomen - Soft, Non tender , non distended Ext- no pedal edema Neuro: sensation is bilaterally intact, power is 5/5 in all extremities  Back - No point tenderness elucidated  Psych : calm and cooperative  Recent labs CBC Latest Ref Rng & Units 02/19/2020 02/06/2020 02/01/2020  WBC 4.0 - 10.5 K/uL 8.2 8.9  6.4  Hemoglobin 13.0 - 17.0 g/dL 11.5(L) 11.6(L) 10.9(L)  Hematocrit 39.0 - 52.0 % 37.4(L) 37.1(L) 34.1(L)  Platelets 150 - 400 K/uL 269 264 256   CMP Latest Ref Rng & Units 02/19/2020 02/17/2020 02/16/2020  Glucose 70 - 99 mg/dL  158(H) 104(H) -  BUN 6 - 20 mg/dL 10 9 -  Creatinine 0.61 - 1.24 mg/dL 1.12 1.04 0.98  Sodium 135 - 145 mmol/L 133(L) 135 -  Potassium 3.5 - 5.1 mmol/L 3.9 4.0 -  Chloride 98 - 111 mmol/L 101 100 -  CO2 22 - 32 mmol/L 24 25 -  Calcium 8.9 - 10.3 mg/dL 8.7(L) 9.1 -  Total Protein 6.5 - 8.1 g/dL 6.8 - -  Total Bilirubin 0.3 - 1.2 mg/dL 0.8 - -  Alkaline Phos 38 - 126 U/L 119 - -  AST 15 - 41 U/L 23 - -  ALT 0 - 44 U/L 9 - -    Pertinent Microbiology Results for orders placed or performed during the hospital encounter of 01/06/20  Respiratory Panel by RT PCR (Flu A&B, Covid) - Nasopharyngeal Swab     Status: None   Collection Time: 01/07/20  3:09 AM   Specimen: Nasopharyngeal Swab  Result Value Ref Range Status   SARS Coronavirus 2 by RT PCR NEGATIVE NEGATIVE Final    Comment: (NOTE) SARS-CoV-2 target nucleic acids are NOT DETECTED.  The SARS-CoV-2 RNA is generally detectable in upper respiratoy specimens during the acute phase of infection. The lowest concentration of SARS-CoV-2 viral copies this assay can detect is 131 copies/mL. A negative result does not preclude SARS-Cov-2 infection and should not be used as the sole basis for treatment or other patient management decisions. A negative result may occur with  improper specimen collection/handling, submission of specimen other than nasopharyngeal swab, presence of viral mutation(s) within the areas targeted by this assay, and inadequate number of viral copies (<131 copies/mL). A negative result must be combined with clinical observations, patient history, and epidemiological information. The expected result is Negative.  Fact Sheet for Patients:  PinkCheek.be  Fact Sheet for Healthcare Providers:  GravelBags.it  This test is no t yet approved or cleared by the [place] FDA and  has been authorized for detection and/or diagnosis of SARS-CoV-2 by FDA under an  Emergency Use Authorization (EUA). This EUA will remain  in effect (meaning this test can be used) for the duration of the COVID-19 declaration under Section 564(b)(1) of the Act, 21 U.S.C. section 360bbb-3(b)(1), unless the authorization is terminated or revoked sooner.     Influenza A by PCR NEGATIVE NEGATIVE Final   Influenza B by PCR NEGATIVE NEGATIVE Final    Comment: (NOTE) The Xpert Xpress SARS-CoV-2/FLU/RSV assay is intended as an aid in  the diagnosis of influenza from Nasopharyngeal swab specimens and  should not be used as a sole basis for treatment. Nasal washings and  aspirates are unacceptable for Xpert Xpress SARS-CoV-2/FLU/RSV  testing.  Fact Sheet for Patients: PinkCheek.be  Fact Sheet for Healthcare Providers: GravelBags.it  This test is not yet approved or cleared by the [place] FDA and  has been authorized for detection and/or diagnosis of SARS-CoV-2 by  FDA under an Emergency Use Authorization (EUA). This EUA will remain  in effect (meaning this test can be used) for the duration of the  Covid-19 declaration under Section 564(b)(1) of the Act, 21  U.S.C. section 360bbb-3(b)(1), unless the authorization is  terminated or revoked. Performed at [facility], [place]  [address], [place], [place] [address]   Culture, blood (routine x 2)     Status: None   Collection Time: 01/08/20  9:10 AM   Specimen: BLOOD  Result Value Ref Range Status   Specimen Description BLOOD RIGHT ANTECUBITAL  Final   Special Requests   Final    BOTTLES DRAWN AEROBIC AND ANAEROBIC Blood Culture adequate volume   Culture   Final    NO GROWTH 5 DAYS Performed at [facility], [place] [address], [place], [place] [address]    Report Status 01/13/2020 FINAL  Final  Culture, blood (routine x 2)     Status: None   Collection Time: 01/08/20  9:13 AM   Specimen: BLOOD  Result Value Ref Range Status   Specimen Description  BLOOD LEFT ANTECUBITAL  Final   Special Requests   Final    BOTTLES DRAWN AEROBIC AND ANAEROBIC Blood Culture adequate volume   Culture   Final    NO GROWTH 5 DAYS Performed at [facility], [place] [address] [place], [place] [address]    Report Status 01/13/2020 FINAL  Final  Body fluid culture     Status: None   Collection Time: 01/08/20  3:01 PM   Specimen: Body Fluid  Result Value Ref Range Status   Specimen Description FLUID  Final   Special Requests DISC L1  Final   Gram Stain   Final    ABUNDANT WBC PRESENT,BOTH PMN AND MONONUCLEAR NO ORGANISMS SEEN    Culture   Final    NO GROWTH 3 DAYS Performed at [facility], [address], [place], [place] [address]    Report Status 01/12/2020 FINAL  Final  Anaerobic culture     Status: None   Collection Time: 01/08/20  3:01 PM   Specimen: Fluid  Result Value Ref Range Status   Specimen Description FLUID  Final   Special Requests DISC L1  Final   Culture   Final    NO ANAEROBES ISOLATED Performed at [facility], [place] [address], [place], [place] [address]    Report Status 01/13/2020 FINAL  Final  Aerobic/Anaerobic Culture (surgical/deep wound)     Status: None   Collection Time: 02/04/20 11:21 AM   Specimen: PATH Cytology Misc. fluid; Body Fluid  Result Value Ref Range Status   Specimen Description SYNOVIAL FLUID  Final   Special Requests RIGHT SHOULDER  Final   Gram Stain   Final    RARE WBC PRESENT, PREDOMINANTLY MONONUCLEAR NO ORGANISMS SEEN    Culture   Final    No growth aerobically or anaerobically. Performed at [facility], [place] [address] [place], [place] [address]    Report Status 02/09/2020 FINAL  Final     Pertinent Imaging FINDINGS: Segmentation: Conventional anatomy assumed, with the last open disc space designated L5-S1.Concordant with previous imaging.  Alignment: Stable. Mild kyphotic angulation at L1-2 and grade 1 retrolisthesis at L1-2 and L2-3 unchanged. Stable chronic  grade 1 anterolisthesis at L4-5.  Vertebrae: Stable postsurgical changes status post laminectomy and PLIF at L4-5. Chronic sequela of discitis and osteomyelitis at L1-2 with endplate irregularity, but no progressive endplate destruction, residual significant endplate edema or abnormal enhancement. Interval improved marrow edema and enhancement associated with the right L3-4 facet joint. No evidence of progressive osteomyelitis or acute osseous findings. The visualized sacroiliac joints appear unremarkable.  Conus medullaris: Extends to the L1 level and appears normal. Previously demonstrated diffuse dural thickening and enhancement  extending from L1 through L5 has improved. There is no residual epidural fluid collection.  Paraspinal and other soft tissues: Improved paraspinal inflammatory changes without focal fluid collection.  Disc levels:  No significant disc space findings from T9-10 through T11-12.  T12-L1: Stable mild disc bulging and facet hypertrophy. No significant spinal stenosis.  L1-2: Chronic sequela discitis and osteomyelitis with loss of disc height, endplate irregularity and posterior osteophytes. There is annular disc bulging and mild bilateral facet hypertrophy. These factors contribute to mild spinal stenosis and moderate narrowing of the lateral recesses and foramina bilaterally. No significant residual epidural fluid collection.  L2-3: Relatively preserved disc height with annular disc bulging eccentric to the right and mild bilateral facet hypertrophy. Mild spinal stenosis and mild right foraminal narrowing. No residual epidural fluid collection.  L3-4: Stable disc bulging eccentric to the right, facet and ligamentous hypertrophy. Stable moderate spinal stenosis and mild narrowing of the lateral recesses and foramina. No residual epidural fluid collection.  L4-5: Status post laminectomy and PLIF. The spinal canal remains adequately  decompressed. Disc bulging and endplate osteophytes contribute to mild foraminal narrowing bilaterally. No residual epidural fluid collection.  L5-S1: The disc appears normal. Mild bilateral facet hypertrophy. No spinal stenosis.  IMPRESSION: 1. Interval improvement in paraspinal inflammatory changes, dural thickening and enhancement. No residual epidural fluid collection. 2. Chronic sequela of discitis and osteomyelitis at L1-2 without progressive endplate destruction, residual endplate edema or abnormal enhancement. 3. Overall improved patency the thecal sac. The greatest remaining spinal stenosis is at L3-4 and is moderate. 4. No acute findings.   All pertinent labs/Imagings/notes reviewed. All pertinent plain films and CT images have been personally visualized and interpreted; radiology reports have been reviewed. Decision making incorporated into the Impression / Recommendations.  I spent greater than 25 minutes with the patient including  review of prior medical records with greater than 50% of time in face to face counsel of the patient.    Electronically signed by:  Rosiland Oz, MD Infectious Disease Physician [facility] [address] [place], [place] [address] Phone: [phone]  Fax: [phone]

## 2020-03-08 ENCOUNTER — Telehealth: Payer: Self-pay

## 2020-03-08 LAB — CBC
HCT: 39.6 % (ref 38.5–50.0)
Hemoglobin: 13.2 g/dL (ref 13.2–17.1)
MCH: 27.6 pg (ref 27.0–33.0)
MCHC: 33.3 g/dL (ref 32.0–36.0)
MCV: 82.8 fL (ref 80.0–100.0)
MPV: 9.2 fL (ref 7.5–12.5)
Platelets: 415 10*3/uL — ABNORMAL HIGH (ref 140–400)
RBC: 4.78 10*6/uL (ref 4.20–5.80)
RDW: 15.1 % — ABNORMAL HIGH (ref 11.0–15.0)
WBC: 9.9 10*3/uL (ref 3.8–10.8)

## 2020-03-08 LAB — COMPREHENSIVE METABOLIC PANEL
AG Ratio: 1.6 (calc) (ref 1.0–2.5)
ALT: 5 U/L — ABNORMAL LOW (ref 9–46)
AST: 19 U/L (ref 10–35)
Albumin: 4.3 g/dL (ref 3.6–5.1)
Alkaline phosphatase (APISO): 138 U/L (ref 35–144)
BUN: 9 mg/dL (ref 7–25)
CO2: 24 mmol/L (ref 20–32)
Calcium: 9.6 mg/dL (ref 8.6–10.3)
Chloride: 99 mmol/L (ref 98–110)
Creat: 0.74 mg/dL (ref 0.70–1.33)
Globulin: 2.7 g/dL (calc) (ref 1.9–3.7)
Glucose, Bld: 96 mg/dL (ref 65–99)
Potassium: 3.6 mmol/L (ref 3.5–5.3)
Sodium: 133 mmol/L — ABNORMAL LOW (ref 135–146)
Total Bilirubin: 0.3 mg/dL (ref 0.2–1.2)
Total Protein: 7 g/dL (ref 6.1–8.1)

## 2020-03-08 LAB — C-REACTIVE PROTEIN: CRP: 1.3 mg/L (ref <8.0)

## 2020-03-08 LAB — SEDIMENTATION RATE: Sed Rate: 2 mm/h (ref 0–20)

## 2020-03-08 NOTE — Telephone Encounter (Signed)
-----   Message from Rosiland Oz, MD sent at 03/08/2020  7:45 AM EST ----- Please let him know that his inflammatory markers from yesterday was normal, so his back infection has been adequately treated. He will need to see a pain specialist for management of back pain. If he wants a referral, I can place an order.

## 2020-03-08 NOTE — Telephone Encounter (Signed)
Will do. Thanks.

## 2020-03-08 NOTE — Addendum Note (Signed)
Addended by: Rosiland Oz on: 03/08/2020 10:29 AM   Modules accepted: Orders

## 2020-03-08 NOTE — Telephone Encounter (Signed)
Spoke with patient to let him know that per Dr. West Bali, his inflammatory markers are normal, indicating his back infection has been treated adequately. Patient verbalized understanding and has no further questions.   Patient would like referral for pain management.   Beryle Flock, RN

## 2020-03-11 ENCOUNTER — Inpatient Hospital Stay: Payer: Medicare Other | Admitting: Infectious Diseases

## 2020-03-14 ENCOUNTER — Other Ambulatory Visit: Payer: Self-pay | Admitting: Specialist

## 2020-03-18 ENCOUNTER — Other Ambulatory Visit: Payer: Self-pay | Admitting: General Practice

## 2020-03-18 DIAGNOSIS — F17210 Nicotine dependence, cigarettes, uncomplicated: Secondary | ICD-10-CM

## 2020-03-18 DIAGNOSIS — F17200 Nicotine dependence, unspecified, uncomplicated: Secondary | ICD-10-CM

## 2020-03-28 ENCOUNTER — Ambulatory Visit: Payer: Medicare Other | Admitting: Surgery

## 2020-03-28 ENCOUNTER — Other Ambulatory Visit: Payer: Self-pay | Admitting: Specialist

## 2020-04-03 ENCOUNTER — Inpatient Hospital Stay: Admission: RE | Admit: 2020-04-03 | Payer: Medicare Other | Source: Ambulatory Visit

## 2020-04-04 ENCOUNTER — Ambulatory Visit: Payer: Medicare Other | Admitting: Surgery

## 2020-04-10 ENCOUNTER — Ambulatory Visit: Payer: Medicare Other | Admitting: Orthopedic Surgery

## 2020-04-11 ENCOUNTER — Ambulatory Visit: Payer: Medicare Other | Admitting: Surgery

## 2020-04-12 ENCOUNTER — Emergency Department (HOSPITAL_COMMUNITY)
Admission: EM | Admit: 2020-04-12 | Discharge: 2020-04-12 | Disposition: A | Discharge status: Home / Self Care | Payer: Medicare Other | Attending: Emergency Medicine | Admitting: Emergency Medicine

## 2020-04-12 ENCOUNTER — Encounter (HOSPITAL_COMMUNITY): Payer: Self-pay

## 2020-04-12 ENCOUNTER — Emergency Department (HOSPITAL_COMMUNITY): Payer: Medicare Other

## 2020-04-12 ENCOUNTER — Other Ambulatory Visit: Payer: Self-pay

## 2020-04-12 DIAGNOSIS — E1159 Type 2 diabetes mellitus with other circulatory complications: Secondary | ICD-10-CM | POA: Diagnosis not present

## 2020-04-12 DIAGNOSIS — W1830XA Fall on same level, unspecified, initial encounter: Secondary | ICD-10-CM | POA: Diagnosis not present

## 2020-04-12 DIAGNOSIS — M25561 Pain in right knee: Secondary | ICD-10-CM | POA: Diagnosis not present

## 2020-04-12 DIAGNOSIS — R202 Paresthesia of skin: Secondary | ICD-10-CM | POA: Insufficient documentation

## 2020-04-12 DIAGNOSIS — M25551 Pain in right hip: Secondary | ICD-10-CM | POA: Diagnosis not present

## 2020-04-12 DIAGNOSIS — I10 Essential (primary) hypertension: Secondary | ICD-10-CM | POA: Insufficient documentation

## 2020-04-12 DIAGNOSIS — Z79899 Other long term (current) drug therapy: Secondary | ICD-10-CM | POA: Insufficient documentation

## 2020-04-12 DIAGNOSIS — F17210 Nicotine dependence, cigarettes, uncomplicated: Secondary | ICD-10-CM | POA: Diagnosis not present

## 2020-04-12 DIAGNOSIS — J4530 Mild persistent asthma, uncomplicated: Secondary | ICD-10-CM | POA: Diagnosis not present

## 2020-04-12 DIAGNOSIS — Z96651 Presence of right artificial knee joint: Secondary | ICD-10-CM | POA: Diagnosis not present

## 2020-04-12 DIAGNOSIS — W19XXXA Unspecified fall, initial encounter: Secondary | ICD-10-CM

## 2020-04-12 LAB — COMPREHENSIVE METABOLIC PANEL
ALT: 12 U/L (ref 0–44)
AST: 34 U/L (ref 15–41)
Albumin: 4.2 g/dL (ref 3.5–5.0)
Alkaline Phosphatase: 107 U/L (ref 38–126)
Anion gap: 15 (ref 5–15)
BUN: 10 mg/dL (ref 6–20)
CO2: 22 mmol/L (ref 22–32)
Calcium: 9.2 mg/dL (ref 8.9–10.3)
Chloride: 100 mmol/L (ref 98–111)
Creatinine, Ser: 0.76 mg/dL (ref 0.61–1.24)
GFR, Estimated: >60 mL/min (ref >60)
Glucose, Bld: 92 mg/dL (ref 70–99)
Potassium: 3.4 mmol/L — ABNORMAL LOW (ref 3.5–5.1)
Sodium: 137 mmol/L (ref 135–145)
Total Bilirubin: 0.3 mg/dL (ref 0.3–1.2)
Total Protein: 7.5 g/dL (ref 6.5–8.1)

## 2020-04-12 LAB — URINALYSIS, ROUTINE W REFLEX MICROSCOPIC
Bilirubin Urine: NEGATIVE
Glucose, UA: NEGATIVE mg/dL
Hgb urine dipstick: NEGATIVE
Ketones, ur: NEGATIVE mg/dL
Leukocytes,Ua: NEGATIVE
Nitrite: NEGATIVE
Protein, ur: NEGATIVE mg/dL
Specific Gravity, Urine: 1.002 — ABNORMAL LOW (ref 1.005–1.030)
pH: 7 (ref 5.0–8.0)

## 2020-04-12 LAB — CBC WITH DIFFERENTIAL/PLATELET
Abs Immature Granulocytes: 0.03 10*3/uL (ref 0.00–0.07)
Basophils Absolute: 0.1 10*3/uL (ref 0.0–0.1)
Basophils Relative: 1 %
Eosinophils Absolute: 0.2 10*3/uL (ref 0.0–0.5)
Eosinophils Relative: 2 %
HCT: 38.8 % — ABNORMAL LOW (ref 39.0–52.0)
Hemoglobin: 12.8 g/dL — ABNORMAL LOW (ref 13.0–17.0)
Immature Granulocytes: 0 %
Lymphocytes Relative: 11 %
Lymphs Abs: 1.1 10*3/uL (ref 0.7–4.0)
MCH: 28.2 pg (ref 26.0–34.0)
MCHC: 33 g/dL (ref 30.0–36.0)
MCV: 85.5 fL (ref 80.0–100.0)
Monocytes Absolute: 0.7 10*3/uL (ref 0.1–1.0)
Monocytes Relative: 7 %
Neutro Abs: 8.4 10*3/uL — ABNORMAL HIGH (ref 1.7–7.7)
Neutrophils Relative %: 79 %
Platelets: 338 10*3/uL (ref 150–400)
RBC: 4.54 MIL/uL (ref 4.22–5.81)
RDW: 14.6 % (ref 11.5–15.5)
WBC: 10.4 10*3/uL (ref 4.0–10.5)
nRBC: 0 % (ref 0.0–0.2)

## 2020-04-12 LAB — SEDIMENTATION RATE: Sed Rate: 4 mm/hr (ref 0–16)

## 2020-04-12 LAB — C-REACTIVE PROTEIN: CRP: 0.5 mg/dL (ref <1.0)

## 2020-04-12 MED ORDER — LORAZEPAM 2 MG/ML IJ SOLN
1.0000 mg | Freq: Once | INTRAMUSCULAR | Status: AC
  Administered 2020-04-12: 1 mg via INTRAVENOUS
  Filled 2020-04-12: qty 1

## 2020-04-12 MED ORDER — MORPHINE SULFATE (PF) 4 MG/ML IV SOLN
4.0000 mg | Freq: Once | INTRAVENOUS | Status: AC
  Administered 2020-04-12: 4 mg via INTRAVENOUS
  Filled 2020-04-12: qty 1

## 2020-04-12 MED ORDER — OXYCODONE-ACETAMINOPHEN 5-325 MG PO TABS
1.0000 | ORAL_TABLET | Freq: Once | ORAL | Status: AC
  Administered 2020-04-12: 1 via ORAL
  Filled 2020-04-12: qty 1

## 2020-04-12 NOTE — ED Triage Notes (Signed)
Pt presents via [facility] from home, reports a mechanical fall on 1/12. Reports R knee and R hip pain, able to ambulate to EMS stretcher

## 2020-04-12 NOTE — ED Notes (Signed)
Pt ambulated to bathroom unassisted and in no distress

## 2020-04-12 NOTE — ED Provider Notes (Signed)
[facility] DEPT Provider Note   CSN: [id number] Arrival date & time: 04/12/20  0116     History Chief Complaint  Patient presents with  . Fall    [name] is a 55 y.o. male with a history of CVA, schizophrenia, GERD, arthritis, osteomyelitis of the lumbar spine, cocaine use disorder in remission for 1 year, alcohol use disorder in remission for several years, chronic pain syndrome who presents to the emergency department by EMS with a chief complaint of fall.  The patient reports that he was walking up a flight of stairs on January 12 when he suddenly became weak in his right leg, which caused him to fall.  He denies hitting his head, LOC, nausea, or vomiting.  He was able to stand after the fall, but reports that his leg has continued to feel weak.  He is now endorsing pain in right knee and hip since the fall.  He also reports that his knee is swollen.  He previously had a total knee replacements in the right knee about a year ago with Dr. [name].  He has been able to walk, but with difficulty.  No other recent falls.  He reports that he has also noticed some weakness in his right arm since yesterday.  He reports a history of a stroke several years ago that presented with numbness, paresthesias, and weakness on the left side of his body.  He also does report right-sided low back pain.  He has a history of chronic low back pain as well as osteomyelitis of the lumbar spine, but reports that pain has been worse recently.  There are no aggravating or alleviating factors.  No fevers, chills, abdominal pain, chest pain, shortness of breath, URI symptoms, visual changes, slurred speech, or facial droop, saddle paresthesias, or urinary or fecal incontinence.  He is a current, everyday smoker.  He has a history of alcohol use, but has been abstinent for the last few years.  He denies any cocaine use for the last year.  The history is provided by the patient and  medical records. No language interpreter was used.       Past Medical History:  Diagnosis Date  . Anxiety   . Arthritis   . Asthma   . Depression   . Duodenal adenoma   . GERD (gastroesophageal reflux disease)   . Hypertension   . Pneumonia    hx  . Post-traumatic osteoarthritis of left ankle   . Pre-diabetes   . Presence of right artificial knee joint 12/17/2016  . Schizophrenia ([place])   . Stroke [facility])    2009-or10    Patient Active Problem List   Diagnosis Date Noted  . Ankle ankylosis, left   . Closed fracture of one rib of left side   . Discitis of lumbar region   . Osteomyelitis ([place]) 01/07/2020  . Acute osteomyelitis of lumbar spine ([place]) 08/16/2019  . Altered mental status   . Delirium   . Toxic metabolic encephalopathy 19/41/7408  . Polysubstance overdose 07/26/2019  . Acute kidney injury ([place]) 07/26/2019  . Chronic pain syndrome 07/26/2019  . SIRS (systemic inflammatory response syndrome) ([place]) 07/26/2019  . Spinal stenosis, lumbar region, with neurogenic claudication 04/11/2019    Class: Chronic  . Lumbar disc herniation 04/11/2019    Class: Chronic  . Status post lumbar spinal fusion 04/11/2019  . Hardware complicating wound infection ([place]) 01/31/2018  . Spondylolisthesis of lumbar region 12/30/2017  . S/P ankle fusion 10/20/2017  .  Post-traumatic osteoarthritis, left ankle and foot 01/07/2017  . Primary osteoarthritis of right knee 05/27/2016  . GERD (gastroesophageal reflux disease) 07/09/2015  . Mild persistent asthma 07/09/2015  . Type 2 diabetes mellitus ([place]) 07/09/2015  . Polyp of duodenum 06/04/2015  . Alcohol dependence ([place]) 02/25/2015  . Moderate episode of recurrent major depressive disorder ([place]) 02/25/2015  . Upper GI bleed 02/18/2015  . Benign essential HTN 02/18/2015  . Anemia of chronic disease 02/18/2015  . Pulmonary nodule 02/18/2015  . Cocaine abuse ([place]) 03/02/2014  . Posttraumatic stress disorder 03/02/2014  . History of CVA  (cerebrovascular accident) 01/12/2014  . Schizophrenia ([place]) 01/12/2014  . Nicotine dependence, cigarettes, uncomplicated [id number]    Past Surgical History:  Procedure Laterality Date  . ANKLE ARTHROSCOPY Left 10/20/2017   Procedure: ANKLE ARTHROSCOPY;  Surgeon: Newt Minion, MD;  Location: [place];  Service: Orthopedics;  Laterality: Left;  . ANKLE FUSION Left 10/20/2017  . ANKLE FUSION Left 10/20/2017   Procedure: LEFT ANKLE FUSION;  Surgeon: Newt Minion, MD;  Location: [place];  Service: Orthopedics;  Laterality: Left;  . ESOPHAGOGASTRODUODENOSCOPY Left 12/05/2013   Procedure: ESOPHAGOGASTRODUODENOSCOPY (EGD);  Surgeon: Arta Silence, MD;  Location: [facility];  Service: Endoscopy;  Laterality: Left;  . ESOPHAGOGASTRODUODENOSCOPY (EGD) WITH PROPOFOL N/A 02/20/2015   Procedure: ESOPHAGOGASTRODUODENOSCOPY (EGD) WITH PROPOFOL;  Surgeon: Wilford Corner, MD;  Location: [facility];  Service: Endoscopy;  Laterality: N/A;  . HARDWARE REMOVAL Left 01/29/2018   Procedure: HARDWARE REMOVAL LEFT ANKLE;  Surgeon: Newt Minion, MD;  Location: [place];  Service: Orthopedics;  Laterality: Left;  . IR LUMBAR [place] W/IMG GUIDE  01/08/2020  . JOINT REPLACEMENT    . TOTAL KNEE ARTHROPLASTY Right 07/21/2016   Procedure: TOTAL KNEE ARTHROPLASTY;  Surgeon: Meredith Pel, MD;  Location: [place];  Service: Orthopedics;  Laterality: Right;  . UPPER GASTROINTESTINAL ENDOSCOPY         Family History  Adopted: Yes  Problem Relation Age of Onset  . Colon cancer Neg Hx   . Esophageal cancer Neg Hx   . Rectal cancer Neg Hx   . Stomach cancer Neg Hx     Social History   Tobacco Use  . Smoking status: Heavy Tobacco Smoker    Packs/day: 1.50    Years: 38.00    Pack years: 57.00    Types: Cigarettes  . Smokeless tobacco: Never Used  . Tobacco comment: onset age 72; upto 1.5 ppd  Vaping Use  . Vaping Use: Never used  Substance Use Topics  . Alcohol use: Not Currently    Alcohol/week:  3.0 standard drinks    Types: 3 Cans of beer per week    Comment: quit 2015; formerly up to 2 fifths/day  . Drug use: No    Home Medications Prior to Admission medications   Medication Sig Start Date End Date Taking? Authorizing Provider  albuterol (PROVENTIL HFA;VENTOLIN HFA) 108 (90 Base) MCG/ACT inhaler Inhale 1 puff into the lungs 2 (two) times daily as needed for wheezing or shortness of breath. 05/14/17   Diallo, Earna Coder, MD  benztropine (COGENTIN) 1 MG tablet Take 1 mg by mouth daily.    [provider]  buPROPion (WELLBUTRIN XL) 150 MG 24 hr tablet Take 1 tablet (150 mg total) by mouth daily. 02/20/20 03/21/20  Para Skeans, MD  cetirizine (ZYRTEC) 10 MG tablet TAKE 1 TABLET BY MOUTH ONCE DAILY. Patient taking differently: Take 10 mg by mouth daily. No Therapeutic Substitution 03/28/18   Diallo,  Abdoulaye, MD  ferrous sulfate 325 (65 FE) MG tablet Take 1 tablet (325 mg total) by mouth daily with breakfast. 02/21/20 03/22/20  Para Skeans, MD  gabapentin (NEURONTIN) 300 MG capsule Take 1 capsule (300 mg total) by mouth 2 (two) times daily. 10/23/19   Jessy Oto, MD  Melatonin 5 MG TABS Take 5 mg by mouth at bedtime as needed (sleep).  02/28/19   [provider]  metoprolol succinate (TOPROL-XL) 25 MG 24 hr tablet Take 0.5 tablets (12.5 mg total) by mouth daily. 02/20/20 03/21/20  Para Skeans, MD  mirtazapine (REMERON) 15 MG tablet Take 15 mg by mouth at bedtime.    [provider]  Multiple Vitamins-Minerals (VITRUM SENIOR) TABS Take 1 tablet by mouth daily.  04/30/15   [provider]  nicotine (NICODERM CQ - DOSED IN MG/24 HOURS) 14 mg/24hr patch Place 1 patch (14 mg total) onto the skin daily. 02/20/20   Para Skeans, MD  omeprazole (PRILOSEC) 40 MG capsule TAKE 1 CAPSULE(40 MG) BY MOUTH DAILY 03/15/20   Jessy Oto, MD  oxyCODONE (OXY IR/ROXICODONE) 5 MG immediate release tablet Take 1 tablet (5 mg total) by mouth every 6 (six) hours as  needed for moderate pain. Patient not taking: Reported on 03/07/2020 09/05/19   Aline August, MD  oxyCODONE ER [facility] ER) 9 MG C12A Take 9 mg by mouth in the morning and at bedtime. 09/05/19   Aline August, MD  pantoprazole (PROTONIX) 40 MG tablet Take 1 tablet (40 mg total) by mouth daily. 02/20/20 03/21/20  Para Skeans, MD  thiamine 100 MG tablet Take 1 tablet (100 mg total) by mouth daily. 09/05/19   Aline August, MD    Allergies    Chlorhexidine  Review of Systems   Review of Systems  Constitutional: Negative for appetite change, chills and fever.  HENT: Negative for congestion and sore throat.   Eyes: Negative for visual disturbance.  Respiratory: Negative for shortness of breath and wheezing.   Cardiovascular: Negative for chest pain and palpitations.  Gastrointestinal: Negative for abdominal pain, constipation, diarrhea, nausea and vomiting.  Genitourinary: Negative for dysuria.  Musculoskeletal: Positive for arthralgias, back pain, gait problem and myalgias. Negative for joint swelling, neck pain and neck stiffness.  Skin: Negative for rash and wound.  Allergic/Immunologic: Negative for immunocompromised state.  Neurological: Positive for weakness and numbness. Negative for dizziness, seizures, syncope and headaches.  Psychiatric/Behavioral: Negative for confusion.    Physical Exam Updated Vital Signs BP (!) 127/96   Pulse 98   Temp 98.8 F (37.1 C) (Oral)   Resp 16   Ht 5\' 9"  (1.753 m)   Wt 90.7 kg   SpO2 100%   BMI 29.53 kg/m   Physical Exam Vitals and nursing note reviewed.  Constitutional:      General: He is not in acute distress.    Appearance: He is well-developed. He is not ill-appearing, toxic-appearing or diaphoretic.  HENT:     Head: Normocephalic.  Eyes:     Extraocular Movements: Extraocular movements intact.     Conjunctiva/sclera: Conjunctivae normal.     Pupils: Pupils are equal, round, and reactive to light.  Cardiovascular:     Rate and  Rhythm: Normal rate and regular rhythm.     Heart sounds: No murmur heard.   Pulmonary:     Effort: Pulmonary effort is normal. No respiratory distress.     Breath sounds: No stridor. No wheezing, rhonchi or rales.  Chest:  Chest wall: No tenderness.  Abdominal:     General: There is no distension.     Palpations: Abdomen is soft. There is no mass.     Tenderness: There is no abdominal tenderness. There is no right CVA tenderness, left CVA tenderness, guarding or rebound.     Hernia: No hernia is present.  Musculoskeletal:     Cervical back: Neck supple.     Right lower leg: No edema.     Left lower leg: No edema.  Skin:    General: Skin is warm and dry.  Neurological:     Mental Status: He is alert.     Comments: Alert and oriented x3.  GCS 15.  Pronator drift to the right arm.  Patient is unable to hold the right leg off the bed.  5 out of 5 strength against resistance to the left lower extremity and left upper extremity.  Cranial nerves II through XII are grossly intact.  Decreased sensation to temperature and light touch noted to the lateral aspect of the right lower leg.  Sensation is otherwise intact.  Psychiatric:        Behavior: Behavior normal.     ED Results / Procedures / Treatments   Labs (all labs ordered are listed, but only abnormal results are displayed) Labs Reviewed  CBC WITH DIFFERENTIAL/PLATELET - Abnormal; Notable for the following components:      Result Value   Hemoglobin 12.8 (*)    HCT 38.8 (*)    Neutro Abs 8.4 (*)    All other components within normal limits  COMPREHENSIVE METABOLIC PANEL - Abnormal; Notable for the following components:   Potassium 3.4 (*)    All other components within normal limits  URINALYSIS, ROUTINE W REFLEX MICROSCOPIC - Abnormal; Notable for the following components:   Color, Urine COLORLESS (*)    Specific Gravity, Urine 1.002 (*)    All other components within normal limits  C-REACTIVE PROTEIN  SEDIMENTATION RATE     EKG None  Radiology DG Knee 2 Views Right  Result Date: 04/12/2020 CLINICAL DATA:  Right knee pain EXAM: RIGHT KNEE - 1-2 VIEW COMPARISON:  08/03/2016 FINDINGS: Right knee arthroplasty. Mild anterior soft tissue swelling. No joint effusion. No fracture. No periprosthetic lucency. There is increased heterotopic ossification inferior to the patellar component of the prosthesis. IMPRESSION: Right knee arthroplasty without periprosthetic lucency. Increased heterotopic ossification inferior to the patellar component of the prosthesis. Electronically Signed   By: Ulyses Jarred M.D.   On: 04/12/2020 01:56   CT Head Wo Contrast  Result Date: 04/12/2020 CLINICAL DATA:  Fall EXAM: CT HEAD WITHOUT CONTRAST TECHNIQUE: Contiguous axial images were obtained from the base of the skull through the vertex without intravenous contrast. COMPARISON:  None. FINDINGS: Brain: There is no mass, hemorrhage or extra-axial collection. The size and configuration of the ventricles and extra-axial CSF spaces are normal. There is hypoattenuation of the white matter, most commonly indicating chronic small vessel disease. Vascular: No abnormal hyperdensity of the major intracranial arteries or dural venous sinuses. No intracranial atherosclerosis. Skull: The visualized skull base, calvarium and extracranial soft tissues are normal. Sinuses/Orbits: No fluid levels or advanced mucosal thickening of the visualized paranasal sinuses. No mastoid or middle ear effusion. The orbits are normal. IMPRESSION: Chronic small vessel disease without acute intracranial abnormality. Electronically Signed   By: Ulyses Jarred M.D.   On: 04/12/2020 03:18   DG Hip Unilat  With Pelvis 2-3 Views Right  Result Date: 04/12/2020 CLINICAL DATA:  Fall, hip pain EXAM: DG HIP (WITH OR WITHOUT PELVIS) 2-3V RIGHT COMPARISON:  None. FINDINGS: There is no evidence of hip fracture or dislocation. There is no evidence of arthropathy or other focal bone  abnormality. Postoperative changes in the lower lumbar spine. IMPRESSION: No acute bony abnormality. Electronically Signed   By: Rolm Baptise M.D.   On: 04/12/2020 01:54    Procedures Procedures (including critical care time)  Medications Ordered in ED Medications  oxyCODONE-acetaminophen (PERCOCET/ROXICET) 5-325 MG per tablet 1 tablet (1 tablet Oral Given 04/12/20 0251)  LORazepam (ATIVAN) injection 1 mg (1 mg Intravenous Given 04/12/20 0446)  morphine 4 MG/ML injection 4 mg (4 mg Intravenous Given 04/12/20 0544)    ED Course  I have reviewed the triage vital signs and the nursing notes.  Pertinent labs & imaging results that were available during my care of the patient were reviewed by me and considered in my medical decision making (see chart for details).    MDM Rules/Calculators/A&P                          55 year old male with a history of CVA, schizophrenia, GERD, arthritis, osteomyelitis of the lumbar spine, cocaine use disorder in remission for 1 year, alcohol use disorder in remission for several years, chronic pain syndrome who presents to the emergency department by EMS after a fall on January 12 secondary to his right leg becoming weak.  He is also endorsing some weakness in his right arm and worse than baseline low back pain.  Patient was seen and evaluated independently by Dr. Christy Gentles, attending physician, who is in agreement with work-up and plan.  Differential diagnosis includes CVA, cauda equina, lumbar radiculopathy, occult fracture, recurrent osteomyelitis of the lumbar spine.  Labs and imaging have been reviewed and independently interpreted by me.  No leukocytosis.  No metabolic derangements.  Sed rate and CRP are normal.  Urinalysis is unremarkable.  X-ray of the right hip and knee are negative for acute findings or fractures.  CT head is negative for acute findings.  MRI of the thoracic and lumbar spine are pending.  Patient care transferred to [facility] at  the end of my shift to follow-up on MRI.  If negative, patient will need to be ambulated and may benefit from being placed in a knee immobilizer and following up with orthopedics.  If positive, admission is likely warranted.  Patient presentation, ED course, and plan of care discussed with review of all pertinent labs and imaging. Please see his/her note for further details regarding further ED course and disposition.   Final Clinical Impression(s) / ED Diagnoses Final diagnoses:  Fall, initial encounter  Acute pain of right knee  Right hip pain    Rx / DC Orders ED Discharge Orders    None       Joanne Gavel, PA-C 04/12/20 [facility]:[id number]    Ripley Fraise, MD 04/12/20 [phone]

## 2020-04-12 NOTE — ED Notes (Signed)
Patient transported to MRI 

## 2020-04-12 NOTE — ED Notes (Signed)
Pt took off knee immobilizer. Pt advised to put it back on; pt stated he would put it back on once he gets home so that it would be easier for him to walk up the hill to get on the bus. Pt was able to walk out of the ED with steady gait.

## 2020-04-12 NOTE — ED Provider Notes (Signed)
Care assumed from [facility], [place], at shift change, please see their notes for full documentation of patient's complaint/HPI. Briefly, pt here with complaint of fall 2 days ago secondary to RLE weakness x "weeks". On exam pt found to have mild weakness with R hip flexion. Results so far show negative R knee and R hip xray as well as negative CT Head. Concern for possible myelopathy vs discitis given previous history of osteomyelitis. Given weakness of RLE x weeks and no RUE involvement lower suspicion for CVA. Awaiting MRI L and T spine. Plan is to follow up on images and dispo accordingly. If negative will provide knee immobilizer for R knee pain and ambulate pt for safety prior to discharge and ortho follow up.    Physical Exam  BP (!) 127/96   Pulse 98   Temp 98.8 F (37.1 C) (Oral)   Resp 16   Ht 5\' 9"  (1.753 m)   Wt 90.7 kg   SpO2 100%   BMI 29.53 kg/m   Physical Exam Vitals and nursing note reviewed.  Constitutional:      Appearance: He is not ill-appearing.  HENT:     Head: Normocephalic and atraumatic.  Eyes:     Conjunctiva/sclera: Conjunctivae normal.  Cardiovascular:     Rate and Rhythm: Normal rate and regular rhythm.  Pulmonary:     Effort: Pulmonary effort is normal.     Breath sounds: Normal breath sounds.  Skin:    General: Skin is warm and dry.     Coloration: Skin is not jaundiced.  Neurological:     Mental Status: He is alert.     ED Course/Procedures     Procedures  DG Knee 2 Views Right  Result Date: 04/12/2020 CLINICAL DATA:  Right knee pain EXAM: RIGHT KNEE - 1-2 VIEW COMPARISON:  08/03/2016 FINDINGS: Right knee arthroplasty. Mild anterior soft tissue swelling. No joint effusion. No fracture. No periprosthetic lucency. There is increased heterotopic ossification inferior to the patellar component of the prosthesis. IMPRESSION: Right knee arthroplasty without periprosthetic lucency. Increased heterotopic ossification inferior to the patellar  component of the prosthesis. Electronically Signed   By: Ulyses Jarred M.D.   On: 04/12/2020 01:56   CT Head Wo Contrast  Result Date: 04/12/2020 CLINICAL DATA:  Fall EXAM: CT HEAD WITHOUT CONTRAST TECHNIQUE: Contiguous axial images were obtained from the base of the skull through the vertex without intravenous contrast. COMPARISON:  None. FINDINGS: Brain: There is no mass, hemorrhage or extra-axial collection. The size and configuration of the ventricles and extra-axial CSF spaces are normal. There is hypoattenuation of the white matter, most commonly indicating chronic small vessel disease. Vascular: No abnormal hyperdensity of the major intracranial arteries or dural venous sinuses. No intracranial atherosclerosis. Skull: The visualized skull base, calvarium and extracranial soft tissues are normal. Sinuses/Orbits: No fluid levels or advanced mucosal thickening of the visualized paranasal sinuses. No mastoid or middle ear effusion. The orbits are normal. IMPRESSION: Chronic small vessel disease without acute intracranial abnormality. Electronically Signed   By: Ulyses Jarred M.D.   On: 04/12/2020 03:18   MR THORACIC SPINE WO CONTRAST  Result Date: 04/12/2020 CLINICAL DATA:  55 year old male status post fall 04/10/2020 with persistent back pain. History of prior lumbar surgery, L1 discitis, lower thoracic dural thickening. Osteomyelitis, epidural abscess. EXAM: MRI THORACIC AND LUMBAR SPINE WITHOUT CONTRAST TECHNIQUE: Multiplanar and multiecho pulse sequences of the cervical spine, to include the craniocervical junction and cervicothoracic junction, and thoracic and lumbar spine, were obtained without  intravenous contrast. COMPARISON:  Lumbar MRI 02/14/2020. Thoracic spine MRI 05/14 and 08/10/2019. CT Abdomen and Pelvis 04/10/2016. FINDINGS: Study is intermittently degraded by motion artifact despite repeated imaging attempts. MRI THORACIC SPINE FINDINGS Limited cervical spine imaging:  Negative. Thoracic  spine segmentation: Normal, same numbering system as on prior studies. Alignment:  Stable, normal thoracic vertebral height and alignment. Vertebrae: No marrow edema or evidence of acute osseous abnormality. Visualized bone marrow signal is within normal limits. Cord: Within normal limits allowing for motion artifact. Capacious spinal canal. Conus medullaris occurs below T12 and is better demonstrated on the lumbar images today. Paraspinal and other soft tissues: The mild lower thoracic paraspinal inflammation seen in May is resolved. The T8 through lower thoracic spine dural/epidural thickening has resolved. No contrast administered today. Negative visible thoracic viscera; aberrant origin of the right subclavian artery suspected (normal variant). Disc levels: Stable generally mild for age thoracic spine degeneration, including mild disc bulging at T6-T7 and T7-T8. Stable mild to moderate lower thoracic facet hypertrophy. No thoracic spinal stenosis. MRI LUMBAR SPINE FINDINGS Segmentation: Normal aside from partially sacralized L5 level (on the left), the same numbering system used on the November MRI. Alignment: Stable lumbar vertebral height and alignment with straightening of upper lumbar lordosis, subtle spondylolisthesis at T12-L1. Vertebrae: Chronic L1 and L2 endplate deformities from resolved discitis osteomyelitis. Mild hardware susceptibility artifact related to L4-L5 posterior and interbody fusion. Motion artifact on lumbar STIR imaging today. No marrow edema or acute osseous abnormality is evident. Visible sacrum and SI joints intact. Conus medullaris and cauda equina: Conus extends to the L1 level. No lower spinal cord or conus signal abnormality. Chronic thickening of the cauda equina at the L4-L5 level compatible with chronic arachnoiditis. Paraspinal and other soft tissues: No paraspinal soft tissue inflammation. Chronic postoperative changes to the lower lumbar paraspinal soft tissues. Stable  visible abdominal viscera, small right lobe hepatic cyst redemonstrated on series 30, image 1. Disc levels: Lumbar spine degeneration appears stable since November, notable for: L1-L2: Obliterated disc space. Endplate spurring with mild spinal and moderate to severe bilateral L1 foraminal stenosis. L2-L3: Chronic circumferential disc bulge eccentric to the right with posterior element hypertrophy and epidural lipomatosis. Moderate spinal and moderate to severe right lateral recess stenosis (right L3 nerve level). L3-L4: Moderate to severe multifactorial spinal and lateral recess stenosis (L4 nerve levels) related to disc bulging and posterior element hypertrophy, especially right ligament flavum enlargement. Postoperative changes suspected to the right L3 neural foramen which is stable. Mild left foraminal stenosis. L4-L5: Stable prior decompression and fusion. Residual multifactorial mild to moderate left L4 for neural foraminal stenosis. L5-S1: Partially sacralized and negative. IMPRESSION: 1. No acute or inflammatory process identified in the thoracic or lumbar spine in the absence of IV contrast. 2. Resolved lower thoracic dural/epidural thickening and healed chronic L1-L2 discitis osteomyelitis with loss of the disc space and residual endplate deformity. 3. No thoracic spinal stenosis. Thoracic spinal cord within normal limits. 4. Widespread lumbar spinal stenosis appears stable since a November MRI: Moderate at L2-L3 and moderate to severe at L3-L4. 5. Stable prior decompression and fusion at L4-L5 with chronic arachnoiditis, residual left L4 neural foraminal stenosis. Electronically Signed   By: Genevie Ann M.D.   On: 04/12/2020 07:26   MR LUMBAR SPINE WO CONTRAST  Result Date: 04/12/2020 CLINICAL DATA:  55 year old male status post fall 04/10/2020 with persistent back pain. History of prior lumbar surgery, L1 discitis, lower thoracic dural thickening. Osteomyelitis, epidural abscess. EXAM: MRI THORACIC AND  LUMBAR SPINE WITHOUT CONTRAST TECHNIQUE: Multiplanar and multiecho pulse sequences of the cervical spine, to include the craniocervical junction and cervicothoracic junction, and thoracic and lumbar spine, were obtained without intravenous contrast. COMPARISON:  Lumbar MRI 02/14/2020. Thoracic spine MRI 05/14 and 08/10/2019. CT Abdomen and Pelvis 04/10/2016. FINDINGS: Study is intermittently degraded by motion artifact despite repeated imaging attempts. MRI THORACIC SPINE FINDINGS Limited cervical spine imaging:  Negative. Thoracic spine segmentation: Normal, same numbering system as on prior studies. Alignment:  Stable, normal thoracic vertebral height and alignment. Vertebrae: No marrow edema or evidence of acute osseous abnormality. Visualized bone marrow signal is within normal limits. Cord: Within normal limits allowing for motion artifact. Capacious spinal canal. Conus medullaris occurs below T12 and is better demonstrated on the lumbar images today. Paraspinal and other soft tissues: The mild lower thoracic paraspinal inflammation seen in May is resolved. The T8 through lower thoracic spine dural/epidural thickening has resolved. No contrast administered today. Negative visible thoracic viscera; aberrant origin of the right subclavian artery suspected (normal variant). Disc levels: Stable generally mild for age thoracic spine degeneration, including mild disc bulging at T6-T7 and T7-T8. Stable mild to moderate lower thoracic facet hypertrophy. No thoracic spinal stenosis. MRI LUMBAR SPINE FINDINGS Segmentation: Normal aside from partially sacralized L5 level (on the left), the same numbering system used on the November MRI. Alignment: Stable lumbar vertebral height and alignment with straightening of upper lumbar lordosis, subtle spondylolisthesis at T12-L1. Vertebrae: Chronic L1 and L2 endplate deformities from resolved discitis osteomyelitis. Mild hardware susceptibility artifact related to L4-L5 posterior  and interbody fusion. Motion artifact on lumbar STIR imaging today. No marrow edema or acute osseous abnormality is evident. Visible sacrum and SI joints intact. Conus medullaris and cauda equina: Conus extends to the L1 level. No lower spinal cord or conus signal abnormality. Chronic thickening of the cauda equina at the L4-L5 level compatible with chronic arachnoiditis. Paraspinal and other soft tissues: No paraspinal soft tissue inflammation. Chronic postoperative changes to the lower lumbar paraspinal soft tissues. Stable visible abdominal viscera, small right lobe hepatic cyst redemonstrated on series 30, image 1. Disc levels: Lumbar spine degeneration appears stable since November, notable for: L1-L2: Obliterated disc space. Endplate spurring with mild spinal and moderate to severe bilateral L1 foraminal stenosis. L2-L3: Chronic circumferential disc bulge eccentric to the right with posterior element hypertrophy and epidural lipomatosis. Moderate spinal and moderate to severe right lateral recess stenosis (right L3 nerve level). L3-L4: Moderate to severe multifactorial spinal and lateral recess stenosis (L4 nerve levels) related to disc bulging and posterior element hypertrophy, especially right ligament flavum enlargement. Postoperative changes suspected to the right L3 neural foramen which is stable. Mild left foraminal stenosis. L4-L5: Stable prior decompression and fusion. Residual multifactorial mild to moderate left L4 for neural foraminal stenosis. L5-S1: Partially sacralized and negative. IMPRESSION: 1. No acute or inflammatory process identified in the thoracic or lumbar spine in the absence of IV contrast. 2. Resolved lower thoracic dural/epidural thickening and healed chronic L1-L2 discitis osteomyelitis with loss of the disc space and residual endplate deformity. 3. No thoracic spinal stenosis. Thoracic spinal cord within normal limits. 4. Widespread lumbar spinal stenosis appears stable since a  November MRI: Moderate at L2-L3 and moderate to severe at L3-L4. 5. Stable prior decompression and fusion at L4-L5 with chronic arachnoiditis, residual left L4 neural foraminal stenosis. Electronically Signed   By: Genevie Ann M.D.   On: 04/12/2020 07:26   DG Hip Unilat  With Pelvis 2-3 Views Right  Result Date: 04/12/2020 CLINICAL DATA:  Fall, hip pain EXAM: DG HIP (WITH OR WITHOUT PELVIS) 2-3V RIGHT COMPARISON:  None. FINDINGS: There is no evidence of hip fracture or dislocation. There is no evidence of arthropathy or other focal bone abnormality. Postoperative changes in the lower lumbar spine. IMPRESSION: No acute bony abnormality. Electronically Signed   By: Rolm Baptise M.D.   On: 04/12/2020 01:54    MDM  MRI: IMPRESSION:  1. No acute or inflammatory process identified in the thoracic or  lumbar spine in the absence of IV contrast.    2. Resolved lower thoracic dural/epidural thickening and healed  chronic L1-L2 discitis osteomyelitis with loss of the disc space and  residual endplate deformity.    3. No thoracic spinal stenosis. Thoracic spinal cord within normal  limits.    4. Widespread lumbar spinal stenosis appears stable since a November  MRI: Moderate at L2-L3 and moderate to severe at L3-L4.    5. Stable prior decompression and fusion at L4-L5 with chronic  arachnoiditis, residual left L4 neural foraminal stenosis.   Mri unremarkable compared to previous. No findings concerning for osteomyelitis today. Pt provided with knee immobilizer and ambulated in the ED without difficulty. Will discharge home at this time with ortho follow up with Dr. [name].   This note was prepared using Dragon voice recognition software and may include unintentional dictation errors due to the inherent limitations of voice recognition software.        Eustaquio Maize, PA-C 04/12/20 0840    Fredia Sorrow, MD 04/18/20 1755

## 2020-04-12 NOTE — Progress Notes (Signed)
Patient arrived in MRI for MRI t spine wo/w and MRI l spine wo/w. Patient was unable to tolerate contrast imaging, pt continued to squeeze ball unable to hold still. Multiple fast seq used for motion correction. Patient has had to be scanned at [facility] on prior occasions for Sedation.  Best obtainable images

## 2020-04-12 NOTE — ED Provider Notes (Signed)
Patient seen/examined in the Emergency Department in conjunction with Advanced Practice Provider  Patient tells me he has had weakness on his right side "for weeks"  And had a recent fall Exam : awake/alert, no facial droop, no arm drift.  Mild weakness noted with right hip flexion.  He has well healed scar to low back.  No erythema noted Plan: on my evaluation, I am more suspicious for myelopathy/discitis given his previous history.  Lower suspicion for acute CVA Recommend MRI Thoracic/lumbar spine with/without contrast     Ripley Fraise, MD 04/12/20 [phone]

## 2020-04-12 NOTE — Discharge Instructions (Addendum)
Thank you for allowing me to care for you today in the Emergency Department.   Please follow-up with Dr. [name] if you continue to have pain in your right knee.

## 2020-04-18 ENCOUNTER — Ambulatory Visit: Payer: Medicare Other | Admitting: Surgery

## 2020-04-25 ENCOUNTER — Ambulatory Visit: Payer: Medicare Other | Admitting: Surgery

## 2020-05-01 ENCOUNTER — Ambulatory Visit: Payer: Medicare Other | Admitting: Surgery

## 2020-05-02 ENCOUNTER — Other Ambulatory Visit: Payer: Self-pay

## 2020-05-02 ENCOUNTER — Ambulatory Visit (INDEPENDENT_AMBULATORY_CARE_PROVIDER_SITE_OTHER): Payer: Medicare Other | Admitting: Surgery

## 2020-05-02 ENCOUNTER — Encounter: Payer: Self-pay | Admitting: Surgery

## 2020-05-02 VITALS — BP 149/85 | HR 129 | Ht 69.0 in | Wt 200.0 lb

## 2020-05-02 DIAGNOSIS — M7061 Trochanteric bursitis, right hip: Secondary | ICD-10-CM

## 2020-05-02 DIAGNOSIS — Z981 Arthrodesis status: Secondary | ICD-10-CM

## 2020-05-02 DIAGNOSIS — Z96651 Presence of right artificial knee joint: Secondary | ICD-10-CM

## 2020-05-02 DIAGNOSIS — M5416 Radiculopathy, lumbar region: Secondary | ICD-10-CM

## 2020-05-02 NOTE — Progress Notes (Signed)
Office Visit Note   Patient: [name]           Date of Birth: 1965-07-25           MRN: [id number] Visit Date: 05/02/2020              Requested by: [name], MD [phone] [address]. [address] [place],  [place] [address] PCP: [name], MD   Assessment & Plan: Visit Diagnoses:  1. S/P lumbar fusion   2. S/P total knee replacement, right   3. Greater trochanteric bursitis, right   4. Radiculopathy, lumbar region     Plan: Since most of patient's hip pain is centered around his greater trochanter bursa today offered injection there. Patient consent right lateral hip was prepped with Betadine and greater trochanter bursa Marcaine/betamethasone injection performed. Tolerated well without complication. After sitting for several minutes patient reported excellent relief of his right lateral hip pain and his gait also did improve. We will have patient follow-up in the office with Dr. Otelia Sergeant in 2 weeks to review thoracic and lumbar MRI scans that were done in the ER. Follow-up with Dr. August Saucer in 1 week for recheck of his right knee.  Follow-Up Instructions: Return for With Dr. August Saucer in 1 week for recheck right knee and Dr. Otelia Sergeant 2 weeks to review MRI scans.   Orders:  Orders Placed This Encounter  Procedures  . Large Joint Inj: R greater trochanter   No orders of the defined types were placed in this encounter.     Procedures: Large Joint Inj: R greater trochanter on 05/02/2020 2:44 PM Indications: pain Details: 22 G 1.5 in needle  Arthrogram: No  Medications: 3 mL lidocaine 1 %; 6 mL bupivacaine 0.25 % Outcome: tolerated well, no immediate complications  6cc marcaine and 1cc betamethasone right greater trochanter bursa injection performed.  Consent was given by the patient. Patient was prepped and draped in the usual sterile fashion.       Clinical Data: No additional findings.   Subjective: Chief Complaint  Patient presents with  . Right Hip - Pain     HPI 55 year old male comes in today with complaints of low back pain, right hip pain, right lower extremity radiculopathy and right knee pain. Patient is status post L4-5 fusion by Dr. Otelia Sergeant January 2021 and status post right total knee replacement by Dr. August Saucer April 2018. Patient last seen by Dr. August Saucer September 2018 and last x-rays were performed May 2018. Last office visit with Dr. Otelia Sergeant for his lumbar May 24, 2019. He states that he has continued to have chronic low back pain with right lower extremity radicular symptoms but right hip and leg pain has been worse over the last 3 to 4 weeks. No injury. Ambulates with a cane. Most of his pain is around the right lateral hip and worse when he is sitting, walking. He has taken ibuprofen oxycodone and Tylenol without any relief. Patient was seen in the emergency room for these complaints April 12, 2020. Patient had MRI scans of the thoracic and lumbar spine and and right knee and hip x-rays. MRI scan showed:  Narrative & Impression  CLINICAL DATA:  55 year old male status post fall 04/10/2020 with persistent back pain. History of prior lumbar surgery, L1 discitis, lower thoracic dural thickening. Osteomyelitis, epidural abscess.  EXAM: MRI THORACIC AND LUMBAR SPINE WITHOUT CONTRAST  TECHNIQUE: Multiplanar and multiecho pulse sequences of the cervical spine, to include the craniocervical junction and cervicothoracic junction, and thoracic and lumbar  spine, were obtained without intravenous contrast.  COMPARISON:  Lumbar MRI 02/14/2020. Thoracic spine MRI 05/14 and 08/10/2019. CT Abdomen and Pelvis 04/10/2016.  FINDINGS: Study is intermittently degraded by motion artifact despite repeated imaging attempts.  MRI THORACIC SPINE FINDINGS  Limited cervical spine imaging:  Negative.  Thoracic spine segmentation: Normal, same numbering system as on prior studies.  Alignment:  Stable, normal thoracic vertebral height and  alignment.  Vertebrae: No marrow edema or evidence of acute osseous abnormality. Visualized bone marrow signal is within normal limits.  Cord: Within normal limits allowing for motion artifact. Capacious spinal canal. Conus medullaris occurs below T12 and is better demonstrated on the lumbar images today.  Paraspinal and other soft tissues: The mild lower thoracic paraspinal inflammation seen in May is resolved.  The T8 through lower thoracic spine dural/epidural thickening has resolved. No contrast administered today.  Negative visible thoracic viscera; aberrant origin of the right subclavian artery suspected (normal variant).  Disc levels:  Stable generally mild for age thoracic spine degeneration, including mild disc bulging at T6-T7 and T7-T8.  Stable mild to moderate lower thoracic facet hypertrophy.  No thoracic spinal stenosis.  MRI LUMBAR SPINE FINDINGS  Segmentation: Normal aside from partially sacralized L5 level (on the left), the same numbering system used on the November MRI.  Alignment: Stable lumbar vertebral height and alignment with straightening of upper lumbar lordosis, subtle spondylolisthesis at T12-L1.  Vertebrae: Chronic L1 and L2 endplate deformities from resolved discitis osteomyelitis. Mild hardware susceptibility artifact related to L4-L5 posterior and interbody fusion. Motion artifact on lumbar STIR imaging today. No marrow edema or acute osseous abnormality is evident. Visible sacrum and SI joints intact.  Conus medullaris and cauda equina: Conus extends to the L1 level. No lower spinal cord or conus signal abnormality. Chronic thickening of the cauda equina at the L4-L5 level compatible with chronic arachnoiditis.  Paraspinal and other soft tissues: No paraspinal soft tissue inflammation. Chronic postoperative changes to the lower lumbar paraspinal soft tissues.  Stable visible abdominal viscera, small right lobe hepatic  cyst redemonstrated on series 30, image 1.  Disc levels:  Lumbar spine degeneration appears stable since November, notable for:  L1-L2: Obliterated disc space. Endplate spurring with mild spinal and moderate to severe bilateral L1 foraminal stenosis.  L2-L3: Chronic circumferential disc bulge eccentric to the right with posterior element hypertrophy and epidural lipomatosis. Moderate spinal and moderate to severe right lateral recess stenosis (right L3 nerve level).  L3-L4: Moderate to severe multifactorial spinal and lateral recess stenosis (L4 nerve levels) related to disc bulging and posterior element hypertrophy, especially right ligament flavum enlargement. Postoperative changes suspected to the right L3 neural foramen which is stable. Mild left foraminal stenosis.  L4-L5: Stable prior decompression and fusion. Residual multifactorial mild to moderate left L4 for neural foraminal stenosis.  L5-S1: Partially sacralized and negative.  IMPRESSION: 1. No acute or inflammatory process identified in the thoracic or lumbar spine in the absence of IV contrast.  2. Resolved lower thoracic dural/epidural thickening and healed chronic L1-L2 discitis osteomyelitis with loss of the disc space and residual endplate deformity.  3. No thoracic spinal stenosis. Thoracic spinal cord within normal limits.  4. Widespread lumbar spinal stenosis appears stable since a November MRI: Moderate at L2-L3 and moderate to severe at L3-L4.  5. Stable prior decompression and fusion at L4-L5 with chronic arachnoiditis, residual left L4 neural foraminal stenosis.   Electronically Signed   By: Odessa Fleming M.D.   On: 04/12/2020 07:26  Right knee x-ray showed:  CLINICAL DATA:  Right knee pain  EXAM: RIGHT KNEE - 1-2 VIEW  COMPARISON:  08/03/2016  FINDINGS: Right knee arthroplasty. Mild anterior soft tissue swelling. No joint effusion. No fracture. No periprosthetic  lucency. There is increased heterotopic ossification inferior to the patellar component of the prosthesis.  IMPRESSION: Right knee arthroplasty without periprosthetic lucency. Increased heterotopic ossification inferior to the patellar component of the prosthesis.   Electronically Signed   By: Deatra Robinson M.D.   On: 04/12/2020 01:56  Right hip x-ray was unremarkable. Patient states that he has been doing recently well up until the onset of symptoms. At times he feels like his right knee gives out on him.  Review of Systems No current cardiac pulmonary GI GU issues  Objective: Vital Signs: BP (!) 149/85   Pulse (!) 129   Ht 5\' 9"  (1.753 m)   Wt 200 lb (90.7 kg)   BMI 29.53 kg/m   Physical Exam HENT:     Head: Normocephalic.     Nose: Nose normal.  Eyes:     Extraocular Movements: Extraocular movements intact.  Pulmonary:     Effort: No respiratory distress.  Musculoskeletal:     Comments: Gait is antalgic with a cane. Bilateral lumbar paraspinal tenderness. Marked tenderness over the right hip greater trochanter bursa. Negative on the left side. Negative straight leg raise. Right hip internal/external rotation causes pain over the lateral hip. Right knee joint line tender. Calf nontender.  Neurological:     Mental Status: He is alert and oriented to person, place, and time.     Ortho Exam  Specialty Comments:  No specialty comments available.  Imaging: No results found.   PMFS History: Patient Active Problem List   Diagnosis Date Noted  . Ankle ankylosis, left   . Closed fracture of one rib of left side   . Discitis of lumbar region   . Osteomyelitis (HCC) 01/07/2020  . Acute osteomyelitis of lumbar spine (HCC) 08/16/2019  . Altered mental status   . Delirium   . Toxic metabolic encephalopathy 07/26/2019  . Polysubstance overdose 07/26/2019  . Acute kidney injury (HCC) 07/26/2019  . Chronic pain syndrome 07/26/2019  . SIRS (systemic inflammatory  response syndrome) (HCC) 07/26/2019  . Spinal stenosis, lumbar region, with neurogenic claudication 04/11/2019    Class: Chronic  . Lumbar disc herniation 04/11/2019    Class: Chronic  . Status post lumbar spinal fusion 04/11/2019  . Hardware complicating wound infection (HCC) 01/31/2018  . Spondylolisthesis of lumbar region 12/30/2017  . S/P ankle fusion 10/20/2017  . Post-traumatic osteoarthritis, left ankle and foot 01/07/2017  . Primary osteoarthritis of right knee 05/27/2016  . GERD (gastroesophageal reflux disease) 07/09/2015  . Mild persistent asthma 07/09/2015  . Type 2 diabetes mellitus (HCC) 07/09/2015  . Polyp of duodenum 06/04/2015  . Alcohol dependence (HCC) 02/25/2015  . Moderate episode of recurrent major depressive disorder (HCC) 02/25/2015  . Upper GI bleed 02/18/2015  . Benign essential HTN 02/18/2015  . Anemia of chronic disease 02/18/2015  . Pulmonary nodule 02/18/2015  . Cocaine abuse (HCC) 03/02/2014  . Posttraumatic stress disorder 03/02/2014  . History of CVA (cerebrovascular accident) 01/12/2014  . Schizophrenia (HCC) 01/12/2014  . Nicotine dependence, cigarettes, uncomplicated 12/06/2013   Past Medical History:  Diagnosis Date  . Anxiety   . Arthritis   . Asthma   . Depression   . Duodenal adenoma   . GERD (gastroesophageal reflux disease)   . Hypertension   .  Pneumonia    hx  . Post-traumatic osteoarthritis of left ankle   . Pre-diabetes   . Presence of right artificial knee joint 12/17/2016  . Schizophrenia (HCC)   . Stroke [facility])    2009-or10    Family History  Adopted: Yes  Problem Relation Age of Onset  . Colon cancer Neg Hx   . Esophageal cancer Neg Hx   . Rectal cancer Neg Hx   . Stomach cancer Neg Hx     Past Surgical History:  Procedure Laterality Date  . ANKLE ARTHROSCOPY Left 10/20/2017   Procedure: ANKLE ARTHROSCOPY;  Surgeon: Nadara Mustard, MD;  Location: [facility] OR;  Service: Orthopedics;  Laterality: Left;  . ANKLE FUSION Left  10/20/2017  . ANKLE FUSION Left 10/20/2017   Procedure: LEFT ANKLE FUSION;  Surgeon: Nadara Mustard, MD;  Location: [facility] OR;  Service: Orthopedics;  Laterality: Left;  . ESOPHAGOGASTRODUODENOSCOPY Left 12/05/2013   Procedure: ESOPHAGOGASTRODUODENOSCOPY (EGD);  Surgeon: Willis Modena, MD;  Location: [facility];  Service: Endoscopy;  Laterality: Left;  . ESOPHAGOGASTRODUODENOSCOPY (EGD) WITH PROPOFOL N/A 02/20/2015   Procedure: ESOPHAGOGASTRODUODENOSCOPY (EGD) WITH PROPOFOL;  Surgeon: Charlott Rakes, MD;  Location: [facility] ENDOSCOPY;  Service: Endoscopy;  Laterality: N/A;  . HARDWARE REMOVAL Left 01/29/2018   Procedure: HARDWARE REMOVAL LEFT ANKLE;  Surgeon: Nadara Mustard, MD;  Location: [facility] OR;  Service: Orthopedics;  Laterality: Left;  . IR LUMBAR DISC ASPIRATION W/IMG GUIDE  01/08/2020  . JOINT REPLACEMENT    . TOTAL KNEE ARTHROPLASTY Right 07/21/2016   Procedure: TOTAL KNEE ARTHROPLASTY;  Surgeon: Cammy Copa, MD;  Location: [facility] OR;  Service: Orthopedics;  Laterality: Right;  . UPPER GASTROINTESTINAL ENDOSCOPY     Social History   Occupational History  . Not on file  Tobacco Use  . Smoking status: Heavy Tobacco Smoker    Packs/day: 1.50    Years: 38.00    Pack years: 57.00    Types: Cigarettes  . Smokeless tobacco: Never Used  . Tobacco comment: onset age 20; upto 1.5 ppd  Vaping Use  . Vaping Use: Never used  Substance and Sexual Activity  . Alcohol use: Not Currently    Alcohol/week: 3.0 standard drinks    Types: 3 Cans of beer per week    Comment: quit 2015; formerly up to 2 fifths/day  . Drug use: No  . Sexual activity: Yes    Comment: declined condoms

## 2020-05-06 MED ORDER — BUPIVACAINE HCL 0.25 % IJ SOLN
6.0000 mL | INTRAMUSCULAR | Status: AC | PRN
  Administered 2020-05-02: 6 mL via INTRA_ARTICULAR

## 2020-05-06 MED ORDER — LIDOCAINE HCL 1 % IJ SOLN
3.0000 mL | INTRAMUSCULAR | Status: AC | PRN
  Administered 2020-05-02: 3 mL

## 2020-05-15 ENCOUNTER — Ambulatory Visit (INDEPENDENT_AMBULATORY_CARE_PROVIDER_SITE_OTHER): Payer: Medicare Other | Admitting: Orthopedic Surgery

## 2020-05-15 DIAGNOSIS — Z96651 Presence of right artificial knee joint: Secondary | ICD-10-CM | POA: Diagnosis not present

## 2020-05-18 ENCOUNTER — Encounter: Payer: Self-pay | Admitting: Orthopedic Surgery

## 2020-05-18 NOTE — Progress Notes (Signed)
Office Visit Note   Patient: [name]           Date of Birth: [birth year]           MRN: [id number] Visit Date: 05/15/2020 Requested by: [name], MD [phone] [address]. [address] [place],  [place] [address] PCP: [name], MD  Subjective: Chief Complaint  Patient presents with  . Right Knee - Pain    HPI: [name] is a 55 year old patient with right knee pain.  Reports relatively constant pain.  He ambulates with a cane.  Had knee replacement done 5 years ago.  He is also had back surgery done.  Describes some lateral pain.  Motrin does not help.  Hurts him at night.  He did have 2 infections in his back.  Patient has a hard time sleeping but he does take Restoril.  Tramadol has not been helpful.  He has tried topical medication.              ROS: All systems reviewed are negative as they relate to the chief complaint within the history of present illness.  Patient denies  fevers or chills.   Assessment & Plan: Visit Diagnoses:  1. S/P total knee replacement, right     Plan: Impression is radiographically normal in exam normal total knee replacement.  No effusion or warmth in the right knee region.  Range of motion is excellent.  Hard to say exactly what the source of the pain is.  Does not look like it is loosening or infection.  I would favor observation for now.  Could be referred pain from his back.  Follow-up as needed.  No narcotic prescription provided today.  Follow-Up Instructions: No follow-ups on file.   Orders:  No orders of the defined types were placed in this encounter.  No orders of the defined types were placed in this encounter.     Procedures: No procedures performed   Clinical Data: No additional findings.  Objective: Vital Signs: There were no vitals taken for this visit.  Physical Exam:   Constitutional: Patient appears well-developed HEENT:  Head: Normocephalic Eyes:EOM are normal Neck: Normal range of motion Cardiovascular: Normal  rate Pulmonary/chest: Effort normal Neurologic: Patient is alert Skin: Skin is warm Psychiatric: Patient has normal mood and affect    Ortho Exam: Ortho exam demonstrates intact extensor mechanism with the range of motion of the right total knee replacement.  No warmth to the knee.  No groin pain with internal ex rotation of the leg.  No effusion in the knee.  Good collateral ligament stability at 0 30 and 90 degrees.  Extensor mechanism is intact.  No other masses lymphadenopathy or skin changes noted in that right knee region.  Pedal pulses palpable.  Specialty Comments:  No specialty comments available.  Imaging: No results found.   PMFS History: Patient Active Problem List   Diagnosis Date Noted  . Ankle ankylosis, left   . Closed fracture of one rib of left side   . Discitis of lumbar region   . Osteomyelitis (HCC) 01/07/2020  . Acute osteomyelitis of lumbar spine (HCC) 08/16/2019  . Altered mental status   . Delirium   . Toxic metabolic encephalopathy 07/26/2019  . Polysubstance overdose 07/26/2019  . Acute kidney injury (HCC) 07/26/2019  . Chronic pain syndrome 07/26/2019  . SIRS (systemic inflammatory response syndrome) (HCC) 07/26/2019  . Spinal stenosis, lumbar region, with neurogenic claudication 04/11/2019    Class: Chronic  . Lumbar disc herniation 04/11/2019  Class: Chronic  . Status post lumbar spinal fusion 04/11/2019  . Hardware complicating wound infection (HCC) 01/31/2018  . Spondylolisthesis of lumbar region 12/30/2017  . S/P ankle fusion 10/20/2017  . Post-traumatic osteoarthritis, left ankle and foot 01/07/2017  . Primary osteoarthritis of right knee 05/27/2016  . GERD (gastroesophageal reflux disease) 07/09/2015  . Mild persistent asthma 07/09/2015  . Type 2 diabetes mellitus (HCC) 07/09/2015  . Polyp of duodenum 06/04/2015  . Alcohol dependence (HCC) 02/25/2015  . Moderate episode of recurrent major depressive disorder (HCC) 02/25/2015  .  Upper GI bleed 02/18/2015  . Benign essential HTN 02/18/2015  . Anemia of chronic disease 02/18/2015  . Pulmonary nodule 02/18/2015  . Cocaine abuse (HCC) 03/02/2014  . Posttraumatic stress disorder 03/02/2014  . History of CVA (cerebrovascular accident) 01/12/2014  . Schizophrenia (HCC) 01/12/2014  . Nicotine dependence, cigarettes, uncomplicated 12/06/2013   Past Medical History:  Diagnosis Date  . Anxiety   . Arthritis   . Asthma   . Depression   . Duodenal adenoma   . GERD (gastroesophageal reflux disease)   . Hypertension   . Pneumonia    hx  . Post-traumatic osteoarthritis of left ankle   . Pre-diabetes   . Presence of right artificial knee joint 12/17/2016  . Schizophrenia (HCC)   . Stroke [facility])    2009-or10    Family History  Adopted: Yes  Problem Relation Age of Onset  . Colon cancer Neg Hx   . Esophageal cancer Neg Hx   . Rectal cancer Neg Hx   . Stomach cancer Neg Hx     Past Surgical History:  Procedure Laterality Date  . ANKLE ARTHROSCOPY Left 10/20/2017   Procedure: ANKLE ARTHROSCOPY;  Surgeon: Nadara Mustard, MD;  Location: [facility] OR;  Service: Orthopedics;  Laterality: Left;  . ANKLE FUSION Left 10/20/2017  . ANKLE FUSION Left 10/20/2017   Procedure: LEFT ANKLE FUSION;  Surgeon: Nadara Mustard, MD;  Location: [facility] OR;  Service: Orthopedics;  Laterality: Left;  . ESOPHAGOGASTRODUODENOSCOPY Left 12/05/2013   Procedure: ESOPHAGOGASTRODUODENOSCOPY (EGD);  Surgeon: Willis Modena, MD;  Location: [facility];  Service: Endoscopy;  Laterality: Left;  . ESOPHAGOGASTRODUODENOSCOPY (EGD) WITH PROPOFOL N/A 02/20/2015   Procedure: ESOPHAGOGASTRODUODENOSCOPY (EGD) WITH PROPOFOL;  Surgeon: Charlott Rakes, MD;  Location: [facility] END[facility]COPY;  Service: Endoscopy;  Laterality: N/A;  . HARDWARE REMOVAL Left 01/29/2018   Procedure: HARDWARE REMOVAL LEFT ANKLE;  Surgeon: Nadara Mustard, MD;  Location: [facility] OR;  Service: Orthopedics;  Laterality: Left;  . IR LUMBAR DISC ASPIRATION W/IMG  GUIDE  01/08/2020  . JOINT REPLACEMENT    . TOTAL KNEE ARTHROPLASTY Right 07/21/2016   Procedure: TOTAL KNEE ARTHROPLASTY;  Surgeon: Cammy Copa, MD;  Location: [facility] OR;  Service: Orthopedics;  Laterality: Right;  . UPPER GASTROINTESTINAL ENDOSCOPY     Social History   Occupational History  . Not on file  Tobacco Use  . Smoking status: Heavy Tobacco Smoker    Packs/day: 1.50    Years: 38.00    Pack years: 57.00    Types: Cigarettes  . Smokeless tobacco: Never Used  . Tobacco comment: onset age 17; upto 1.5 ppd  Vaping Use  . Vaping Use: Never used  Substance and Sexual Activity  . Alcohol use: Not Currently    Alcohol/week: 3.0 standard drinks    Types: 3 Cans of beer per week    Comment: quit 2015; formerly up to 2 fifths/day  . Drug use: No  . Sexual activity: Yes  Comment: declined condoms

## 2020-05-23 ENCOUNTER — Ambulatory Visit: Payer: Medicare Other | Admitting: Specialist

## 2020-06-03 ENCOUNTER — Other Ambulatory Visit: Payer: Self-pay | Admitting: Specialist

## 2020-06-06 ENCOUNTER — Ambulatory Visit: Payer: Medicare Other | Admitting: Specialist

## 2020-06-15 ENCOUNTER — Other Ambulatory Visit: Payer: Self-pay

## 2020-06-15 ENCOUNTER — Emergency Department (HOSPITAL_COMMUNITY)
Admission: EM | Admit: 2020-06-15 | Discharge: 2020-06-15 | Disposition: A | Discharge status: Home / Self Care | Payer: Medicare Other | Attending: Emergency Medicine | Admitting: Emergency Medicine

## 2020-06-15 DIAGNOSIS — Z8673 Personal history of transient ischemic attack (TIA), and cerebral infarction without residual deficits: Secondary | ICD-10-CM | POA: Insufficient documentation

## 2020-06-15 DIAGNOSIS — F17210 Nicotine dependence, cigarettes, uncomplicated: Secondary | ICD-10-CM | POA: Diagnosis not present

## 2020-06-15 DIAGNOSIS — Z96651 Presence of right artificial knee joint: Secondary | ICD-10-CM | POA: Insufficient documentation

## 2020-06-15 DIAGNOSIS — I10 Essential (primary) hypertension: Secondary | ICD-10-CM | POA: Insufficient documentation

## 2020-06-15 DIAGNOSIS — J45909 Unspecified asthma, uncomplicated: Secondary | ICD-10-CM | POA: Diagnosis not present

## 2020-06-15 DIAGNOSIS — R040 Epistaxis: Secondary | ICD-10-CM

## 2020-06-15 DIAGNOSIS — R059 Cough, unspecified: Secondary | ICD-10-CM | POA: Diagnosis present

## 2020-06-15 DIAGNOSIS — Z79899 Other long term (current) drug therapy: Secondary | ICD-10-CM | POA: Diagnosis not present

## 2020-06-15 MED ORDER — OXYMETAZOLINE HCL 0.05 % NA SOLN
2.0000 | Freq: Two times a day (BID) | NASAL | Status: DC | PRN
  Administered 2020-06-15: 2 via NASAL
  Filled 2020-06-15: qty 30

## 2020-06-15 NOTE — ED Triage Notes (Signed)
Pt inhaled and injected heroin approximately 1 hour ago. He called EMS when his nose began to bleed.  Bleeding is no longer active.

## 2020-06-15 NOTE — ED Provider Notes (Signed)
[facility] DEPT Provider Note: Georgena Spurling, MD, FACEP  CSN: [id number] MRN: [id number] ARRIVAL: 06/15/20 at [place]: [place]  Epistaxis   HISTORY OF PRESENT ILLNESS  06/15/20 3:48 AM [name] is a 55 y.o. male who snorted heroin approximately 1 hour prior to arrival.  His nose began bleeding and so he called EMS.  The bleeding was from the left naris.  The bleeding has subsequently abated.  When the bleeding was present he was coughing up some blood from the back of his throat.  He denies any pain.   Past Medical History:  Diagnosis Date  . Anxiety   . Arthritis   . Asthma   . Depression   . Duodenal adenoma   . GERD (gastroesophageal reflux disease)   . Hypertension   . Pneumonia    hx  . Post-traumatic osteoarthritis of left ankle   . Pre-diabetes   . Presence of right artificial knee joint 12/17/2016  . Schizophrenia ([place])   . Stroke [facility])    2009-or10    Past Surgical History:  Procedure Laterality Date  . ANKLE ARTHROSCOPY Left 10/20/2017   Procedure: ANKLE ARTHROSCOPY;  Surgeon: Newt Minion, MD;  Location: [facility];  Service: Orthopedics;  Laterality: Left;  . ANKLE FUSION Left 10/20/2017  . ANKLE FUSION Left 10/20/2017   Procedure: LEFT ANKLE FUSION;  Surgeon: Newt Minion, MD;  Location: [facility];  Service: Orthopedics;  Laterality: Left;  . ESOPHAGOGASTRODUODENOSCOPY Left 12/05/2013   Procedure: ESOPHAGOGASTRODUODENOSCOPY (EGD);  Surgeon: Arta Silence, MD;  [facility];  Service: Endoscopy;  Laterality: Left;  . ESOPHAGOGASTRODUODENOSCOPY (EGD) WITH PROPOFOL N/A 02/20/2015   Procedure: ESOPHAGOGASTRODUODENOSCOPY (EGD) WITH PROPOFOL;  Surgeon: Wilford Corner, MD;  [facility];  Service: Endoscopy;  Laterality: N/A;  . HARDWARE REMOVAL Left 01/29/2018   Procedure: HARDWARE REMOVAL LEFT ANKLE;  Surgeon: Newt Minion, MD;  Location: [facility];  Service: Orthopedics;  Laterality: Left;  . IR LUMBAR [place] W/IMG GUIDE  01/08/2020  . JOINT REPLACEMENT    . TOTAL KNEE ARTHROPLASTY Right 07/21/2016   Procedure: TOTAL KNEE ARTHROPLASTY;  Surgeon: Meredith Pel, MD;  Location: [facility];  Service: Orthopedics;  Laterality: Right;  . UPPER GASTROINTESTINAL ENDOSCOPY      Family History  Adopted: Yes  Problem Relation Age of Onset  . Colon cancer Neg Hx   . Esophageal cancer Neg Hx   . Rectal cancer Neg Hx   . Stomach cancer Neg Hx     Social History   Tobacco Use  . Smoking status: Heavy Tobacco Smoker    Packs/day: 1.50    Years: 38.00    Pack years: 57.00    Types: Cigarettes  . Smokeless tobacco: Never Used  . Tobacco comment: onset age 9; upto 1.5 ppd  Vaping Use  . Vaping Use: Never used  Substance Use Topics  . Alcohol use: Not Currently    Alcohol/week: 3.0 standard drinks    Types: 3 Cans of beer per week    Comment: quit 2015; formerly up to 2 fifths/day  . Drug use: No    Prior to Admission medications   Medication Sig Start Date End Date Taking? Authorizing Provider  albuterol (PROVENTIL HFA;VENTOLIN HFA) 108 (90 Base) MCG/ACT inhaler Inhale 1 puff into the lungs 2 (two) times daily as needed for wheezing or shortness of breath. 05/14/17   Diallo, Earna Coder, MD  benztropine (COGENTIN) 1 MG tablet Take 1 mg by mouth  daily.    [provider]  buPROPion (WELLBUTRIN XL) 150 MG 24 hr tablet Take 1 tablet (150 mg total) by mouth daily. 02/20/20 03/21/20  Para Skeans, MD  cetirizine (ZYRTEC) 10 MG tablet TAKE 1 TABLET BY MOUTH ONCE DAILY. Patient taking differently: Take 10 mg by mouth daily. No Therapeutic Substitution 03/28/18   Diallo, Earna Coder, MD  ferrous sulfate 325 (65 FE) MG tablet Take 1 tablet (325 mg total) by mouth daily with breakfast. 02/21/20 03/22/20  Para Skeans, MD  gabapentin (NEURONTIN) 300 MG capsule Take 1 capsule (300 mg total) by mouth 2 (two) times daily. 10/23/19   Jessy Oto, MD  Melatonin 5 MG TABS Take 5 mg by mouth at  bedtime as needed (sleep).  02/28/19   [provider]  metoprolol succinate (TOPROL-XL) 25 MG 24 hr tablet Take 0.5 tablets (12.5 mg total) by mouth daily. 02/20/20 03/21/20  Para Skeans, MD  mirtazapine (REMERON) 15 MG tablet Take 15 mg by mouth at bedtime.    [provider]  Multiple Vitamins-Minerals (VITRUM SENIOR) TABS Take 1 tablet by mouth daily.  04/30/15   [provider]  nicotine (NICODERM CQ - DOSED IN MG/24 HOURS) 14 mg/24hr patch Place 1 patch (14 mg total) onto the skin daily. 02/20/20   Para Skeans, MD  omeprazole (PRILOSEC) 40 MG capsule TAKE 1 CAPSULE(40 MG) BY MOUTH DAILY 06/03/20   Jessy Oto, MD  oxyCODONE ER [facility] ER) 9 MG C12A Take 9 mg by mouth in the morning and at bedtime. 09/05/19   Aline August, MD  pantoprazole (PROTONIX) 40 MG tablet Take 1 tablet (40 mg total) by mouth daily. 02/20/20 03/21/20  Para Skeans, MD  thiamine 100 MG tablet Take 1 tablet (100 mg total) by mouth daily. 09/05/19   Aline August, MD    Allergies Chlorhexidine   REVIEW OF SYSTEMS  Negative except as noted here or in the History of Present Illness.   PHYSICAL EXAMINATION  Initial Vital Signs Blood pressure 116/80, pulse (!) 128, temperature 99.1 F (37.3 C), temperature source Oral, SpO2 92 %.  Examination General: Well-developed, well-nourished male in no acute distress; appears older than age of record HENT: normocephalic; atraumatic; widespread dental decay; dried blood in left naris Eyes: pupils equal, round and reactive to light; extraocular muscles intact Neck: supple Heart: regular rate and rhythm Lungs: clear to auscultation bilaterally Abdomen: soft; nondistended; nontender; bowel sounds present Extremities: No deformity; full range of motion; pulses normal Neurologic: Awake, alert; motor function intact in all extremities and symmetric; no facial droop Skin: Warm and dry Psychiatric: Normal mood and affect   RESULTS  Summary of  this visit's results, reviewed and interpreted by myself:   EKG Interpretation  Date/Time:    Ventricular Rate:    PR Interval:    QRS Duration:   QT Interval:    QTC Calculation:   R Axis:     Text Interpretation:        Laboratory Studies: No results found for this or any previous visit (from the past 24 hour(s)). Imaging Studies: No results found.  ED COURSE and MDM  Nursing notes, initial and subsequent vitals signs, including pulse oximetry, reviewed and interpreted by myself.  Vitals:   06/15/20 0334  BP: 116/80  Pulse: (!) 128  Temp: 99.1 F (37.3 C)  TempSrc: Oral  SpO2: 92%   Medications  oxymetazoline (AFRIN) 0.05 % nasal spray 2 spray (has no administration in time range)  Patient's nose is hemostatic.  We will provide with Afrin if symptoms recur.  PROCEDURES  Procedures   ED DIAGNOSES     ICD-10-CM   1. Acute anterior epistaxis  R04.0        Awilda Covin, Jenny Reichmann, MD 06/15/20 [phone]

## 2020-07-01 ENCOUNTER — Ambulatory Visit: Payer: Medicare Other | Admitting: Specialist

## 2020-07-22 DIAGNOSIS — F419 Anxiety disorder, unspecified: Secondary | ICD-10-CM | POA: Insufficient documentation

## 2020-07-22 DIAGNOSIS — J449 Chronic obstructive pulmonary disease, unspecified: Secondary | ICD-10-CM | POA: Diagnosis present

## 2020-07-22 DIAGNOSIS — R768 Other specified abnormal immunological findings in serum: Secondary | ICD-10-CM | POA: Insufficient documentation

## 2020-07-22 DIAGNOSIS — I509 Heart failure, unspecified: Secondary | ICD-10-CM

## 2020-07-22 DIAGNOSIS — F32A Depression, unspecified: Secondary | ICD-10-CM | POA: Insufficient documentation

## 2020-07-22 DIAGNOSIS — F1011 Alcohol abuse, in remission: Secondary | ICD-10-CM | POA: Insufficient documentation

## 2020-07-22 DIAGNOSIS — J45909 Unspecified asthma, uncomplicated: Secondary | ICD-10-CM | POA: Insufficient documentation

## 2020-07-28 DIAGNOSIS — F209 Schizophrenia, unspecified: Secondary | ICD-10-CM

## 2020-07-28 DIAGNOSIS — F1910 Other psychoactive substance abuse, uncomplicated: Secondary | ICD-10-CM

## 2020-07-28 HISTORY — DX: Other psychoactive substance abuse, uncomplicated: F19.10

## 2020-07-28 HISTORY — DX: Schizophrenia, unspecified: F20.9

## 2020-08-16 ENCOUNTER — Other Ambulatory Visit: Payer: Self-pay | Admitting: Family Medicine

## 2020-08-16 DIAGNOSIS — F17210 Nicotine dependence, cigarettes, uncomplicated: Secondary | ICD-10-CM

## 2020-08-19 IMAGING — RF DG LUMBAR SPINE COMPLETE 4+V
1 series · 4 of 4 positions shown · non-contrast
Comparison: None.

FLUOROSCOPY TIME:  1 minutes 32 seconds

CLINICAL DATA: Lumbar fusion

EXAM:
LUMBAR SPINE - COMPLETE 4+ VIEW; DG C-ARM 1-60 MIN

[Series 1: run · 4 of 4 slices shown]
[im 1/4]
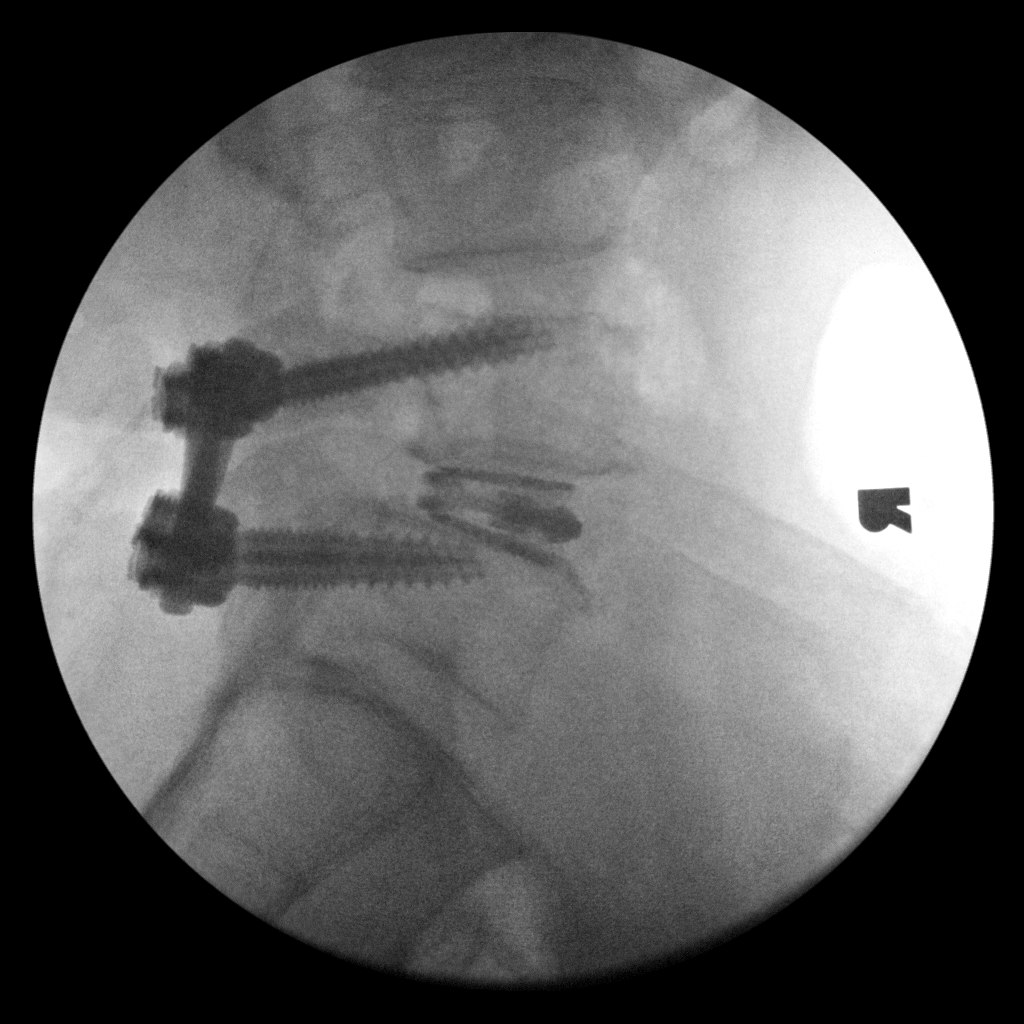
[im 2/4]
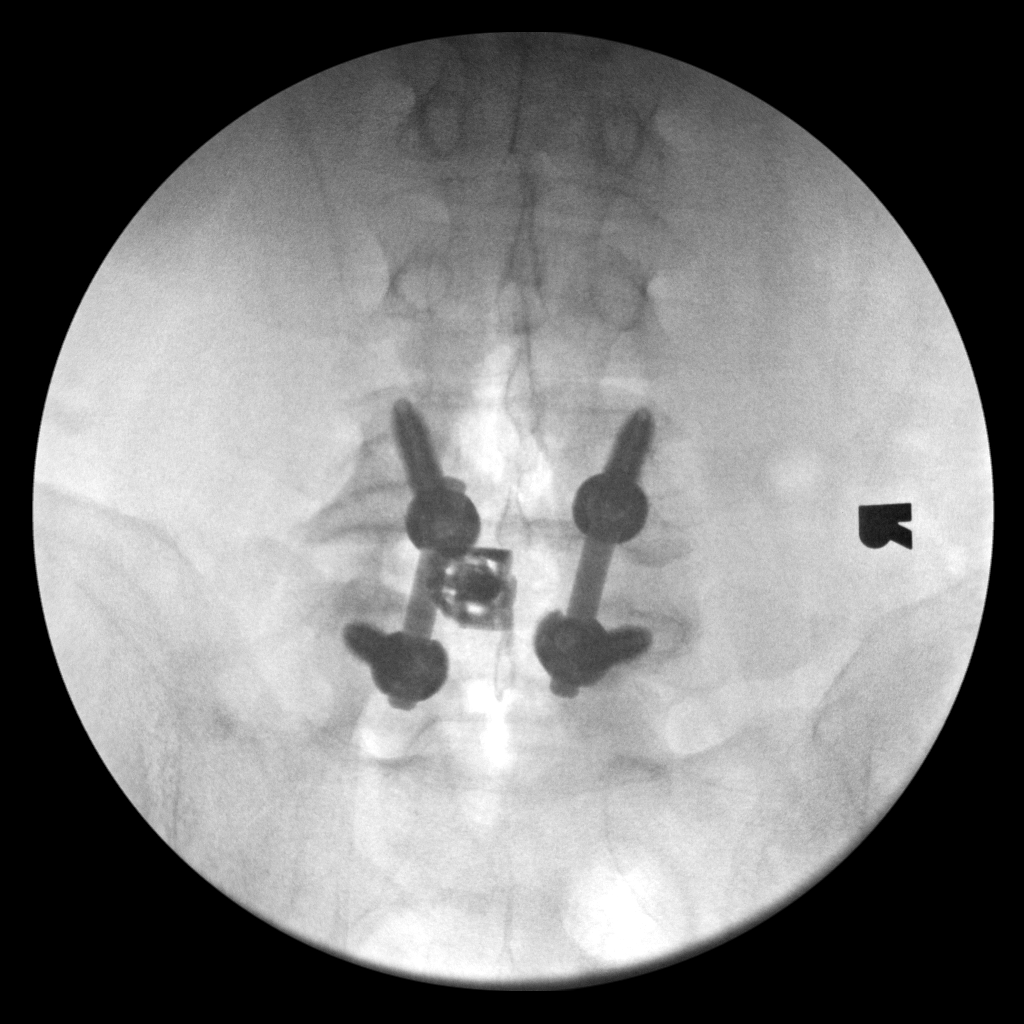
[im 3/4]
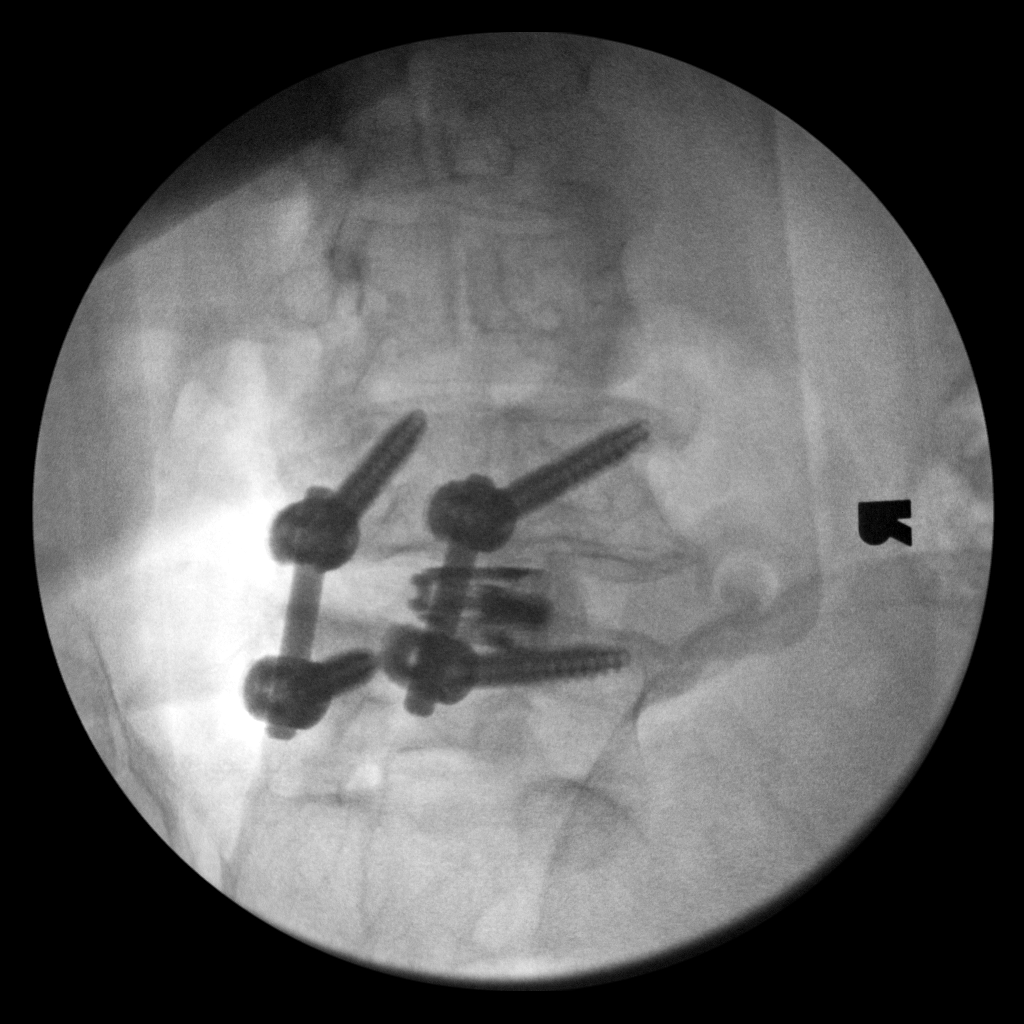
[im 4/4]
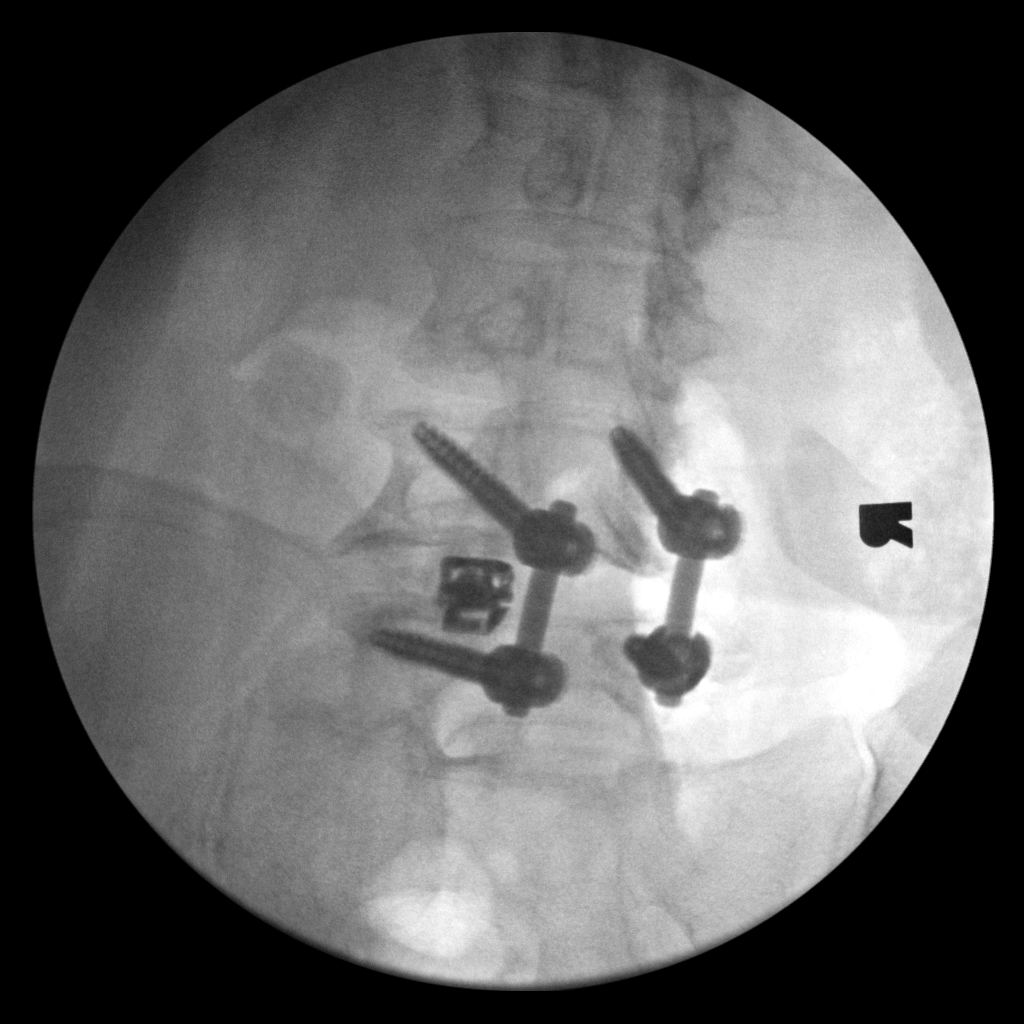

[4 of 4 positions shown; findings below may reference images not displayed]

FINDINGS: Frontal, lateral, and oblique intraoperative radiographs demonstrate
operative changes of posterior and anterior fusion at L4-L5. There
is bilateral pedicle screw fixation and interbody cage.
IMPRESSION: Fluoroscopic guidance for L4-L5 fusion.

## 2020-08-21 ENCOUNTER — Ambulatory Visit: Payer: Medicare Other | Admitting: Surgery

## 2020-08-26 ENCOUNTER — Ambulatory Visit (INDEPENDENT_AMBULATORY_CARE_PROVIDER_SITE_OTHER)
Admission: EM | Admit: 2020-08-26 | Discharge: 2020-08-26 | Disposition: A | Discharge status: Other Acute Inpatient Hospital | Payer: Medicare Other | Source: Home / Self Care

## 2020-08-26 ENCOUNTER — Other Ambulatory Visit: Payer: Self-pay

## 2020-08-26 ENCOUNTER — Encounter (HOSPITAL_COMMUNITY): Payer: Self-pay | Admitting: *Deleted

## 2020-08-26 ENCOUNTER — Emergency Department (HOSPITAL_COMMUNITY)
Admission: EM | Admit: 2020-08-26 | Discharge: 2020-08-26 | Disposition: A | Discharge status: Home / Self Care | Payer: Medicare Other | Attending: Emergency Medicine | Admitting: Emergency Medicine

## 2020-08-26 DIAGNOSIS — Z9151 Personal history of suicidal behavior: Secondary | ICD-10-CM | POA: Insufficient documentation

## 2020-08-26 DIAGNOSIS — F17210 Nicotine dependence, cigarettes, uncomplicated: Secondary | ICD-10-CM | POA: Insufficient documentation

## 2020-08-26 DIAGNOSIS — Z9152 Personal history of nonsuicidal self-harm: Secondary | ICD-10-CM | POA: Insufficient documentation

## 2020-08-26 DIAGNOSIS — Z79899 Other long term (current) drug therapy: Secondary | ICD-10-CM | POA: Insufficient documentation

## 2020-08-26 DIAGNOSIS — E119 Type 2 diabetes mellitus without complications: Secondary | ICD-10-CM | POA: Insufficient documentation

## 2020-08-26 DIAGNOSIS — F209 Schizophrenia, unspecified: Secondary | ICD-10-CM | POA: Insufficient documentation

## 2020-08-26 DIAGNOSIS — J45909 Unspecified asthma, uncomplicated: Secondary | ICD-10-CM | POA: Diagnosis not present

## 2020-08-26 DIAGNOSIS — Z96651 Presence of right artificial knee joint: Secondary | ICD-10-CM | POA: Diagnosis not present

## 2020-08-26 DIAGNOSIS — F1120 Opioid dependence, uncomplicated: Secondary | ICD-10-CM

## 2020-08-26 DIAGNOSIS — R000 Tachycardia, unspecified: Secondary | ICD-10-CM | POA: Insufficient documentation

## 2020-08-26 DIAGNOSIS — R45851 Suicidal ideations: Secondary | ICD-10-CM | POA: Insufficient documentation

## 2020-08-26 DIAGNOSIS — Z8673 Personal history of transient ischemic attack (TIA), and cerebral infarction without residual deficits: Secondary | ICD-10-CM | POA: Insufficient documentation

## 2020-08-26 DIAGNOSIS — I10 Essential (primary) hypertension: Secondary | ICD-10-CM | POA: Insufficient documentation

## 2020-08-26 DIAGNOSIS — M549 Dorsalgia, unspecified: Secondary | ICD-10-CM | POA: Insufficient documentation

## 2020-08-26 DIAGNOSIS — R030 Elevated blood-pressure reading, without diagnosis of hypertension: Secondary | ICD-10-CM | POA: Insufficient documentation

## 2020-08-26 DIAGNOSIS — R2681 Unsteadiness on feet: Secondary | ICD-10-CM | POA: Insufficient documentation

## 2020-08-26 DIAGNOSIS — F1110 Opioid abuse, uncomplicated: Secondary | ICD-10-CM

## 2020-08-26 DIAGNOSIS — F11129 Opioid abuse with intoxication, unspecified: Secondary | ICD-10-CM | POA: Diagnosis not present

## 2020-08-26 DIAGNOSIS — F1910 Other psychoactive substance abuse, uncomplicated: Secondary | ICD-10-CM

## 2020-08-26 NOTE — ED Notes (Signed)
Pt provided with food and oral hydration. Pt ambulated to the restroom with no assistance.

## 2020-08-26 NOTE — ED Triage Notes (Signed)
Patient presents to ed from [facility] with c/o heroin use and being unsteady on his feet. , states he is wanting detox.

## 2020-08-26 NOTE — ED Notes (Signed)
Pt asking "What am I here for?"

## 2020-08-26 NOTE — ED Provider Notes (Addendum)
[facility] EMERGENCY DEPARTMENT Provider Note   CSN: [id number] Arrival date & time: 08/26/20  0349     History Chief Complaint  Patient presents with  . Drug Problem    [name] is a 55 y.o. male.   Drug Problem This is a chronic problem. The current episode started more than 1 week ago. The problem occurs daily. The problem has not changed since onset.Pertinent negatives include no chest pain, no abdominal pain, no headaches and no shortness of breath. Nothing aggravates the symptoms. Nothing relieves the symptoms. He has tried nothing for the symptoms. The treatment provided no relief.  Patient with h/o polysubstance abuse and schizophrenia presents from [facility].  Patient currently has no complaints at this time and denies SI or HI, states he is NOT having hallucinations.  He states he last used Heroin at 11 pm along with marijuana.  He denies nausea, vomiting and diarrhea.  States he is currently sleepy and hungry and turns over and pulls the blanket up.      Past Medical History:  Diagnosis Date  . Anxiety   . Arthritis   . Asthma   . Depression   . Duodenal adenoma   . GERD (gastroesophageal reflux disease)   . Hypertension   . Pneumonia    hx  . Post-traumatic osteoarthritis of left ankle   . Pre-diabetes   . Presence of right artificial knee joint 12/17/2016  . Schizophrenia ([place])   . Stroke [facility])    2009-or10    Patient Active Problem List   Diagnosis Date Noted  . Ankle ankylosis, left   . Closed fracture of one rib of left side   . Discitis of lumbar region   . Osteomyelitis ([place]) 01/07/2020  . Acute osteomyelitis of lumbar spine ([place]) 08/16/2019  . Altered mental status   . Delirium   . Toxic metabolic encephalopathy 25/95/6387  . Polysubstance overdose 07/26/2019  . Acute kidney injury ([place]) 07/26/2019  . Chronic pain syndrome 07/26/2019  . SIRS (systemic inflammatory response syndrome) ([place]) 07/26/2019  . Spinal stenosis,  lumbar region, with neurogenic claudication 04/11/2019    Class: Chronic  . Lumbar disc herniation 04/11/2019    Class: Chronic  . Status post lumbar spinal fusion 04/11/2019  . Hardware complicating wound infection ([place]) 01/31/2018  . Spondylolisthesis of lumbar region 12/30/2017  . S/P ankle fusion 10/20/2017  . Post-traumatic osteoarthritis, left ankle and foot 01/07/2017  . Primary osteoarthritis of right knee 05/27/2016  . GERD (gastroesophageal reflux disease) 07/09/2015  . Mild persistent asthma 07/09/2015  . Type 2 diabetes mellitus ([place]) 07/09/2015  . Polyp of duodenum 06/04/2015  . Alcohol dependence ([place]) 02/25/2015  . Moderate episode of recurrent major depressive disorder ([place]) 02/25/2015  . Upper GI bleed 02/18/2015  . Benign essential HTN 02/18/2015  . Anemia of chronic disease 02/18/2015  . Pulmonary nodule 02/18/2015  . Cocaine abuse ([place]) 03/02/2014  . Posttraumatic stress disorder 03/02/2014  . History of CVA (cerebrovascular accident) 01/12/2014  . Schizophrenia ([place]) 01/12/2014  . Nicotine dependence, cigarettes, uncomplicated 56/43/3295    Past Surgical History:  Procedure Laterality Date  . ANKLE ARTHROSCOPY Left 10/20/2017   Procedure: ANKLE ARTHROSCOPY;  Surgeon: Newt Minion, MD;  Location: [facility];  Service: Orthopedics;  Laterality: Left;  . ANKLE FUSION Left 10/20/2017  . ANKLE FUSION Left 10/20/2017   Procedure: LEFT ANKLE FUSION;  Surgeon: Newt Minion, MD;  Location: [facility];  Service: Orthopedics;  Laterality: Left;  . ESOPHAGOGASTRODUODENOSCOPY  Left 12/05/2013   Procedure: ESOPHAGOGASTRODUODENOSCOPY (EGD);  Surgeon: Arta Silence, MD;  [facility];  Service: Endoscopy;  Laterality: Left;  . ESOPHAGOGASTRODUODENOSCOPY (EGD) WITH PROPOFOL N/A 02/20/2015   Procedure: ESOPHAGOGASTRODUODENOSCOPY (EGD) WITH PROPOFOL;  Surgeon: Wilford Corner, MD;  [facility];  Service: Endoscopy;  Laterality: N/A;  . HARDWARE REMOVAL Left  01/29/2018   Procedure: HARDWARE REMOVAL LEFT ANKLE;  Surgeon: Newt Minion, MD;  Location: [facility];  Service: Orthopedics;  Laterality: Left;  . IR LUMBAR [place] W/IMG GUIDE  01/08/2020  . JOINT REPLACEMENT    . TOTAL KNEE ARTHROPLASTY Right 07/21/2016   Procedure: TOTAL KNEE ARTHROPLASTY;  Surgeon: Meredith Pel, MD;  Location: [facility];  Service: Orthopedics;  Laterality: Right;  . UPPER GASTROINTESTINAL ENDOSCOPY         Family History  Adopted: Yes  Problem Relation Age of Onset  . Colon cancer Neg Hx   . Esophageal cancer Neg Hx   . Rectal cancer Neg Hx   . Stomach cancer Neg Hx     Social History   Tobacco Use  . Smoking status: Heavy Tobacco Smoker    Packs/day: 1.50    Years: 38.00    Pack years: 57.00    Types: Cigarettes  . Smokeless tobacco: Never Used  . Tobacco comment: onset age 55; upto 1.5 ppd  Vaping Use  . Vaping Use: Never used  Substance Use Topics  . Alcohol use: Not Currently    Alcohol/week: 3.0 standard drinks    Types: 3 Cans of beer per week    Comment: quit 2015; formerly up to 2 fifths/day  . Drug use: Yes    Comment: Heroin     Home Medications Prior to Admission medications   Medication Sig Start Date End Date Taking? Authorizing Provider  albuterol (PROVENTIL HFA;VENTOLIN HFA) 108 (90 Base) MCG/ACT inhaler Inhale 1 puff into the lungs 2 (two) times daily as needed for wheezing or shortness of breath. 05/14/17   Diallo, Earna Coder, MD  benztropine (COGENTIN) 1 MG tablet Take 1 mg by mouth daily.    [provider]  buPROPion (WELLBUTRIN XL) 150 MG 24 hr tablet Take 1 tablet (150 mg total) by mouth daily. 02/20/20 03/21/20  Para Skeans, MD  cetirizine (ZYRTEC) 10 MG tablet TAKE 1 TABLET BY MOUTH ONCE DAILY. Patient taking differently: Take 10 mg by mouth daily. No Therapeutic Substitution 03/28/18   Diallo, Earna Coder, MD  ferrous sulfate 325 (65 FE) MG tablet Take 1 tablet (325 mg total) by mouth daily with breakfast.  02/21/20 03/22/20  Para Skeans, MD  gabapentin (NEURONTIN) 300 MG capsule Take 1 capsule (300 mg total) by mouth 2 (two) times daily. 10/23/19   Jessy Oto, MD  Melatonin 5 MG TABS Take 5 mg by mouth at bedtime as needed (sleep).  02/28/19   [provider]  metoprolol succinate (TOPROL-XL) 25 MG 24 hr tablet Take 0.5 tablets (12.5 mg total) by mouth daily. 02/20/20 03/21/20  Para Skeans, MD  mirtazapine (REMERON) 15 MG tablet Take 15 mg by mouth at bedtime.    [provider]  Multiple Vitamins-Minerals (VITRUM SENIOR) TABS Take 1 tablet by mouth daily.  04/30/15   [provider]  nicotine (NICODERM CQ - DOSED IN MG/24 HOURS) 14 mg/24hr patch Place 1 patch (14 mg total) onto the skin daily. 02/20/20   Para Skeans, MD  omeprazole (PRILOSEC) 40 MG capsule TAKE 1 CAPSULE(40 MG) BY MOUTH DAILY Patient taking differently:  Take 40 mg by mouth daily. 06/03/20   Jessy Oto, MD  oxyCODONE ER [facility] ER) 9 MG C12A Take 9 mg by mouth in the morning and at bedtime. 09/05/19   Aline August, MD  pantoprazole (PROTONIX) 40 MG tablet Take 1 tablet (40 mg total) by mouth daily. 02/20/20 03/21/20  Para Skeans, MD  thiamine 100 MG tablet Take 1 tablet (100 mg total) by mouth daily. 09/05/19   Aline August, MD    Allergies    Chlorhexidine  Review of Systems   Review of Systems  Constitutional: Negative for fever.  HENT: Negative for drooling and facial swelling.   Eyes: Negative for redness.  Respiratory: Negative for shortness of breath.   Cardiovascular: Negative for chest pain.  Gastrointestinal: Negative for abdominal pain.  Genitourinary: Negative for dysuria.  Musculoskeletal: Negative for neck stiffness.  Skin: Negative for rash.  Neurological: Negative for facial asymmetry and headaches.  Psychiatric/Behavioral: Negative for agitation.  All other systems reviewed and are negative.   Physical Exam Updated Vital Signs BP (!) 154/107 (BP Location: Left  Arm)   Pulse (!) 114   Temp 98.5 F (36.9 C) (Oral)   Resp 14   Ht 5\' 9"  (1.753 m)   Wt 77.1 kg   SpO2 99%   BMI 25.10 kg/m   Physical Exam Vitals and nursing note reviewed.  Constitutional:      General: He is not in acute distress.    Appearance: Normal appearance. He is not ill-appearing or diaphoretic.  HENT:     Head: Normocephalic and atraumatic.     Nose: Nose normal.  Eyes:     Conjunctiva/sclera: Conjunctivae normal.     Pupils: Pupils are equal, round, and reactive to light.  Cardiovascular:     Rate and Rhythm: Regular rhythm. Tachycardia present.     Pulses: Normal pulses.     Heart sounds: Normal heart sounds.  Pulmonary:     Effort: Pulmonary effort is normal. No respiratory distress.     Breath sounds: Normal breath sounds. No wheezing or rales.  Abdominal:     General: Abdomen is flat. Bowel sounds are normal.     Palpations: Abdomen is soft.     Tenderness: There is no abdominal tenderness. There is no guarding.  Musculoskeletal:        General: No tenderness. Normal range of motion.     Cervical back: Normal range of motion and neck supple.     Right lower leg: No edema.     Left lower leg: No edema.  Skin:    General: Skin is warm and dry.     Capillary Refill: Capillary refill takes less than 2 seconds.  Neurological:     General: No focal deficit present.     Mental Status: He is alert and oriented to person, place, and time.     Deep Tendon Reflexes: Reflexes normal.  Psychiatric:        Behavior: Behavior is not agitated.        Thought Content: Thought content is not paranoid. Thought content does not include homicidal or suicidal ideation. Thought content does not include homicidal or suicidal plan.     ED Results / Procedures / Treatments   Labs (all labs ordered are listed, but only abnormal results are displayed) Labs Reviewed - No data to display  EKG None  Radiology No results found.  Procedures Procedures   Medications  Ordered in ED Medications - No data to display  ED Course  I have reviewed the triage vital signs and the nursing notes.  Pertinent labs & imaging results that were available during my care of the patient were reviewed by me and considered in my medical decision making (see chart for details).   The patient is currently complaint free.  He denies SI and Hi.  He reported to [place] he did not know why he is here. When asked if he would like resources for heroin detox he says yes.  He has eaten and drank and he stated he would like to sleep and pulls up his covers.    I have reexamined and spoken with the patient with [facility] NT present.  Patient states that he feels safe and does not feel like harming himself.  He states he would like to sleep.    I am unsure what happened at [facility].  Perhaps they were unable to wait out the high from the heroin and marijuana.  This patient is medically stable. The patient is now clinically sober and is denying SI or HI, he feels safe.  Does not want to harm himself.  He is denying all complaints and was also PO challenged and has been ambulating about.  Will contract for safety.  Stable for discharge with outpatient resources.    [name] was evaluated in Emergency Department on 08/26/2020 for the symptoms described in the history of present illness. He was evaluated in the context of the global COVID-19 pandemic, which necessitated consideration that the patient might be at risk for infection with the SARS-CoV-2 virus that causes COVID-19. Institutional protocols and algorithms that pertain to the evaluation of patients at risk for COVID-19 are in a state of rapid change based on information released by regulatory bodies including the CDC and federal and state organizations. These policies and algorithms were followed during the patient's care in the ED. Final Clinical Impression(s) / ED Diagnoses Return for intractable cough, coughing up blood, fevers >100.4  unrelieved by medication, shortness of breath, intractable vomiting, chest pain, shortness of breath, weakness, numbness, changes in speech, facial asymmetry, abdominal pain, passing out, Inability to tolerate liquids or food, cough, altered mental status or any concerns. No signs of systemic illness or infection. The patient is nontoxic-appearing on exam and vital signs are within normal limits.  I have reviewed the triage vital signs and the nursing notes. Pertinent labs & imaging results that were available during my care of the patient were reviewed by me and considered in my medical decision making (see chart for details). After history, exam, and medical workup I feel the patient has been appropriately medically screened and is safe for discharge home. Pertinent diagnoses were discussed with the patient. Patient was given return precautions.     [place], Leno Mathes, MD 08/26/20 [phone]
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
## 2020-08-26 NOTE — ED Notes (Signed)
Pt has been up walking around without assistance. EDP asked pt if he felt safe, which he endorsed.

## 2020-08-26 NOTE — Discharge Instructions (Addendum)
Patient to be transferred to [facility] ED for medical clearance and observation/safety monitoring.
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
## 2020-08-26 NOTE — ED Notes (Signed)
Patient states he has no intention of harming himself or others. Patient states he has an apartment to return home to. Taxi voucher provided for patient. Patient declined discharge vital signs.

## 2020-09-03 ENCOUNTER — Inpatient Hospital Stay: Admission: RE | Admit: 2020-09-03 | Payer: Medicare Other | Source: Ambulatory Visit

## 2020-09-11 ENCOUNTER — Ambulatory Visit: Payer: Medicare Other | Admitting: Surgery

## 2020-09-17 ENCOUNTER — Inpatient Hospital Stay: Admission: RE | Admit: 2020-09-17 | Payer: Medicare Other | Source: Ambulatory Visit

## 2020-10-03 ENCOUNTER — Inpatient Hospital Stay: Admission: RE | Admit: 2020-10-03 | Payer: Medicare Other | Source: Ambulatory Visit

## 2020-10-03 ENCOUNTER — Ambulatory Visit: Payer: Medicare Other | Admitting: Specialist

## 2020-11-02 ENCOUNTER — Emergency Department (HOSPITAL_COMMUNITY): Payer: Medicare Other

## 2020-11-02 ENCOUNTER — Other Ambulatory Visit: Payer: Self-pay

## 2020-11-02 ENCOUNTER — Encounter (HOSPITAL_COMMUNITY): Payer: Self-pay | Admitting: Emergency Medicine

## 2020-11-02 ENCOUNTER — Inpatient Hospital Stay (HOSPITAL_COMMUNITY)
Admission: EM | Admit: 2020-11-02 | Discharge: 2020-11-18 | DRG: 028 | Disposition: A | Discharge status: Home / Self Care | Payer: Medicare Other | Attending: Family Medicine | Admitting: Family Medicine

## 2020-11-02 DIAGNOSIS — G061 Intraspinal abscess and granuloma: Secondary | ICD-10-CM | POA: Diagnosis not present

## 2020-11-02 DIAGNOSIS — J449 Chronic obstructive pulmonary disease, unspecified: Secondary | ICD-10-CM | POA: Diagnosis present

## 2020-11-02 DIAGNOSIS — G062 Extradural and subdural abscess, unspecified: Secondary | ICD-10-CM | POA: Diagnosis present

## 2020-11-02 DIAGNOSIS — G629 Polyneuropathy, unspecified: Secondary | ICD-10-CM | POA: Diagnosis present

## 2020-11-02 DIAGNOSIS — K219 Gastro-esophageal reflux disease without esophagitis: Secondary | ICD-10-CM | POA: Diagnosis present

## 2020-11-02 DIAGNOSIS — I10 Essential (primary) hypertension: Secondary | ICD-10-CM | POA: Diagnosis present

## 2020-11-02 DIAGNOSIS — E871 Hypo-osmolality and hyponatremia: Secondary | ICD-10-CM | POA: Diagnosis present

## 2020-11-02 DIAGNOSIS — M4626 Osteomyelitis of vertebra, lumbar region: Secondary | ICD-10-CM | POA: Diagnosis present

## 2020-11-02 DIAGNOSIS — F209 Schizophrenia, unspecified: Secondary | ICD-10-CM | POA: Diagnosis present

## 2020-11-02 DIAGNOSIS — M4646 Discitis, unspecified, lumbar region: Secondary | ICD-10-CM

## 2020-11-02 DIAGNOSIS — E876 Hypokalemia: Secondary | ICD-10-CM | POA: Diagnosis not present

## 2020-11-02 DIAGNOSIS — Z20822 Contact with and (suspected) exposure to covid-19: Secondary | ICD-10-CM | POA: Diagnosis present

## 2020-11-02 DIAGNOSIS — R7303 Prediabetes: Secondary | ICD-10-CM | POA: Diagnosis present

## 2020-11-02 DIAGNOSIS — B965 Pseudomonas (aeruginosa) (mallei) (pseudomallei) as the cause of diseases classified elsewhere: Secondary | ICD-10-CM | POA: Diagnosis present

## 2020-11-02 DIAGNOSIS — K6812 Psoas muscle abscess: Secondary | ICD-10-CM | POA: Diagnosis present

## 2020-11-02 DIAGNOSIS — Z8673 Personal history of transient ischemic attack (TIA), and cerebral infarction without residual deficits: Secondary | ICD-10-CM

## 2020-11-02 DIAGNOSIS — F17210 Nicotine dependence, cigarettes, uncomplicated: Secondary | ICD-10-CM | POA: Diagnosis present

## 2020-11-02 DIAGNOSIS — D649 Anemia, unspecified: Secondary | ICD-10-CM | POA: Diagnosis present

## 2020-11-02 DIAGNOSIS — Z96651 Presence of right artificial knee joint: Secondary | ICD-10-CM | POA: Diagnosis present

## 2020-11-02 DIAGNOSIS — Z981 Arthrodesis status: Secondary | ICD-10-CM

## 2020-11-02 DIAGNOSIS — Z79899 Other long term (current) drug therapy: Secondary | ICD-10-CM

## 2020-11-02 LAB — COMPREHENSIVE METABOLIC PANEL
ALT: 12 U/L (ref 0–44)
AST: 34 U/L (ref 15–41)
Albumin: 3.8 g/dL (ref 3.5–5.0)
Alkaline Phosphatase: 103 U/L (ref 38–126)
Anion gap: 11 (ref 5–15)
BUN: 8 mg/dL (ref 6–20)
CO2: 25 mmol/L (ref 22–32)
Calcium: 9.3 mg/dL (ref 8.9–10.3)
Chloride: 95 mmol/L — ABNORMAL LOW (ref 98–111)
Creatinine, Ser: 0.71 mg/dL (ref 0.61–1.24)
GFR, Estimated: >60 mL/min (ref >60)
Glucose, Bld: 133 mg/dL — ABNORMAL HIGH (ref 70–99)
Potassium: 3.5 mmol/L (ref 3.5–5.1)
Sodium: 131 mmol/L — ABNORMAL LOW (ref 135–145)
Total Bilirubin: 0.5 mg/dL (ref 0.3–1.2)
Total Protein: 7.7 g/dL (ref 6.5–8.1)

## 2020-11-02 LAB — CBC WITH DIFFERENTIAL/PLATELET
Abs Immature Granulocytes: 0.11 10*3/uL — ABNORMAL HIGH (ref 0.00–0.07)
Basophils Absolute: 0 10*3/uL (ref 0.0–0.1)
Basophils Relative: 0 %
Eosinophils Absolute: 0 10*3/uL (ref 0.0–0.5)
Eosinophils Relative: 0 %
HCT: 35.6 % — ABNORMAL LOW (ref 39.0–52.0)
Hemoglobin: 11.9 g/dL — ABNORMAL LOW (ref 13.0–17.0)
Immature Granulocytes: 1 %
Lymphocytes Relative: 3 %
Lymphs Abs: 0.6 10*3/uL — ABNORMAL LOW (ref 0.7–4.0)
MCH: 27.9 pg (ref 26.0–34.0)
MCHC: 33.4 g/dL (ref 30.0–36.0)
MCV: 83.4 fL (ref 80.0–100.0)
Monocytes Absolute: 1.1 10*3/uL — ABNORMAL HIGH (ref 0.1–1.0)
Monocytes Relative: 5 %
Neutro Abs: 19.3 10*3/uL — ABNORMAL HIGH (ref 1.7–7.7)
Neutrophils Relative %: 91 %
Platelets: 326 10*3/uL (ref 150–400)
RBC: 4.27 MIL/uL (ref 4.22–5.81)
RDW: 14.6 % (ref 11.5–15.5)
WBC: 21 10*3/uL — ABNORMAL HIGH (ref 4.0–10.5)
nRBC: 0 % (ref 0.0–0.2)

## 2020-11-02 LAB — LACTIC ACID, PLASMA: Lactic Acid, Venous: 1.6 mmol/L (ref 0.5–1.9)

## 2020-11-02 LAB — URINALYSIS, ROUTINE W REFLEX MICROSCOPIC
Bilirubin Urine: NEGATIVE
Glucose, UA: NEGATIVE mg/dL
Hgb urine dipstick: NEGATIVE
Ketones, ur: NEGATIVE mg/dL
Leukocytes,Ua: NEGATIVE
Nitrite: NEGATIVE
Protein, ur: NEGATIVE mg/dL
Specific Gravity, Urine: 1.003 — ABNORMAL LOW (ref 1.005–1.030)
pH: 8 (ref 5.0–8.0)

## 2020-11-02 LAB — RESP PANEL BY RT-PCR (FLU A&B, COVID) ARPGX2
Influenza A by PCR: NEGATIVE
Influenza B by PCR: NEGATIVE
SARS Coronavirus 2 by RT PCR: NEGATIVE

## 2020-11-02 MED ORDER — HYDROMORPHONE HCL 1 MG/ML IJ SOLN
1.0000 mg | Freq: Once | INTRAMUSCULAR | Status: AC
Start: 2020-11-02 — End: 2020-11-02
  Administered 2020-11-02: 1 mg via INTRAVENOUS
  Filled 2020-11-02: qty 1

## 2020-11-02 MED ORDER — LIDOCAINE 5 % EX PTCH
1.0000 | MEDICATED_PATCH | CUTANEOUS | Status: DC
  Administered 2020-11-02 – 2020-11-16 (×12): 1 via TRANSDERMAL
  Filled 2020-11-02 (×12): qty 1

## 2020-11-02 MED ORDER — FENTANYL CITRATE (PF) 100 MCG/2ML IJ SOLN
50.0000 ug | Freq: Once | INTRAMUSCULAR | Status: AC
  Administered 2020-11-03: 50 ug via INTRAVENOUS
  Filled 2020-11-02: qty 2

## 2020-11-02 MED ORDER — GADOBUTROL 1 MMOL/ML IV SOLN
7.5000 mL | Freq: Once | INTRAVENOUS | Status: AC | PRN
  Administered 2020-11-02: 7.5 mL via INTRAVENOUS

## 2020-11-02 MED ORDER — HYDROMORPHONE HCL 1 MG/ML IJ SOLN
1.0000 mg | Freq: Once | INTRAMUSCULAR | Status: AC
  Administered 2020-11-02: 1 mg via INTRAVENOUS
  Filled 2020-11-02: qty 1

## 2020-11-02 NOTE — ED Triage Notes (Signed)
Pt BIB EMS from home- c/o back pain, progressively worse x3 days.  Hx of back surgery. Denies falls, injury to area.

## 2020-11-02 NOTE — ED Provider Notes (Signed)
[facility] DEPT Provider Note   CSN: [id number] Arrival date & time: 11/02/20  1441     History Chief Complaint  Patient presents with   Back Pain    [name] is a 55 y.o. male with a hx of anxiety, depression, asthma, schizophrenia, prior stroke, prior GI bleed, anemia, & polysubstance abuse who presents to the emergency department with complaints of worsening lower back pain over the past 3 days.  Patient states the pain is in the center of his lower back, it radiates to his right hip/knee, it is constant, aching, worse with any attempted movement, no alleviating factors.  He has tried taking Tylenol without much relief.  He states he has had borderline temperatures at home around 99.7 with his continuous Tylenol use.  He is concerned that this feels similar to when he had an infection in his back after his lumbar spine surgery a couple of years ago.  He denies any recent fall/trauma.  Patient has baseline neuropathy in his feet, denies acute change.  Denies incontinence, saddle anesthesia, history of cancer, dysuria, hematuria, abdominal pain, chest pain, shortness of breath, cough, or syncope. Hx of substance abuse in chart, patient adamantly denies any IVDU over the past 6 months.      HPI     Past Medical History:  Diagnosis Date   Anxiety    Arthritis    Asthma    Depression    Duodenal adenoma    GERD (gastroesophageal reflux disease)    Hypertension    Pneumonia    hx   Post-traumatic osteoarthritis of left ankle    Pre-diabetes    Presence of right artificial knee joint 12/17/2016   Schizophrenia ([place])    Stroke ([place])    2009-or10    Patient Active Problem List   Diagnosis Date Noted   Ankle ankylosis, left    Closed fracture of one rib of left side    Discitis of lumbar region    Osteomyelitis ([place]) 01/07/2020   Acute osteomyelitis of lumbar spine ([place]) 08/16/2019   Altered mental status    Delirium    Toxic metabolic  encephalopathy [id number]   Polysubstance overdose 07/26/2019   Acute kidney injury ([place]) 07/26/2019   Chronic pain syndrome 07/26/2019   SIRS (systemic inflammatory response syndrome) ([place]) 07/26/2019   Spinal stenosis, lumbar region, with neurogenic claudication 04/11/2019    Class: Chronic   Lumbar disc herniation 04/11/2019    Class: Chronic   Status post lumbar spinal fusion 99991111   Hardware complicating wound infection ([place]) 01/31/2018   Spondylolisthesis of lumbar region 12/30/2017   S/P ankle fusion 10/20/2017   Post-traumatic osteoarthritis, left ankle and foot 01/07/2017   Primary osteoarthritis of right knee 05/27/2016   GERD (gastroesophageal reflux disease) 07/09/2015   Mild persistent asthma 07/09/2015   Type 2 diabetes mellitus ([place]) 07/09/2015   Polyp of duodenum 06/04/2015   Alcohol dependence ([place]) 02/25/2015   Moderate episode of recurrent major depressive disorder ([place]) 02/25/2015   Upper GI bleed 02/18/2015   Benign essential HTN 02/18/2015   Anemia of chronic disease 02/18/2015   Pulmonary nodule 02/18/2015   Cocaine abuse ([place]) 03/02/2014   Posttraumatic stress disorder 03/02/2014   History of CVA (cerebrovascular accident) 01/12/2014   Schizophrenia ([place]) 01/12/2014   Nicotine dependence, cigarettes, uncomplicated [id number]    Past Surgical History:  Procedure Laterality Date   ANKLE ARTHROSCOPY Left 10/20/2017   Procedure: ANKLE ARTHROSCOPY;  Surgeon: Newt Minion, MD;  Location:  [facility];  Service: Orthopedics;  Laterality: Left;   ANKLE FUSION Left 10/20/2017   ANKLE FUSION Left 10/20/2017   Procedure: LEFT ANKLE FUSION;  Surgeon: Newt Minion, MD;  Location: [facility];  Service: Orthopedics;  Laterality: Left;   ESOPHAGOGASTRODUODENOSCOPY Left 12/05/2013   Procedure: ESOPHAGOGASTRODUODENOSCOPY (EGD);  Surgeon: Arta Silence, MD;  Location: [facility];  Service: Endoscopy;  Laterality: Left;   ESOPHAGOGASTRODUODENOSCOPY (EGD) WITH PROPOFOL N/A  02/20/2015   Procedure: ESOPHAGOGASTRODUODENOSCOPY (EGD) WITH PROPOFOL;  Surgeon: Wilford Corner, MD;  Location: [facility];  Service: Endoscopy;  Laterality: N/A;   HARDWARE REMOVAL Left 01/29/2018   Procedure: HARDWARE REMOVAL LEFT ANKLE;  Surgeon: Newt Minion, MD;  Location: [facility];  Service: Orthopedics;  Laterality: Left;   IR LUMBAR DISC ASPIRATION W/IMG GUIDE  01/08/2020   JOINT REPLACEMENT     TOTAL KNEE ARTHROPLASTY Right 07/21/2016   Procedure: TOTAL KNEE ARTHROPLASTY;  Surgeon: Meredith Pel, MD;  Location: [facility];  Service: Orthopedics;  Laterality: Right;   UPPER GASTROINTESTINAL ENDOSCOPY         Family History  Adopted: Yes  Problem Relation Age of Onset   Colon cancer Neg Hx    Esophageal cancer Neg Hx    Rectal cancer Neg Hx    Stomach cancer Neg Hx     Social History   Tobacco Use   Smoking status: Heavy Smoker    Packs/day: 1.50    Years: 38.00    Pack years: 57.00    Types: Cigarettes   Smokeless tobacco: Never   Tobacco comments:    onset age 16; upto 1.5 ppd  Vaping Use   Vaping Use: Never used  Substance Use Topics   Alcohol use: Not Currently    Alcohol/week: 3.0 standard drinks    Types: 3 Cans of beer per week    Comment: quit 2015; formerly up to 2 fifths/day   Drug use: Yes    Comment: Heroin     Home Medications Prior to Admission medications   Medication Sig Start Date End Date Taking? Authorizing Provider  albuterol (PROVENTIL HFA;VENTOLIN HFA) 108 (90 Base) MCG/ACT inhaler Inhale 1 puff into the lungs 2 (two) times daily as needed for wheezing or shortness of breath. 05/14/17   Diallo, Earna Coder, MD  benztropine (COGENTIN) 1 MG tablet Take 1 mg by mouth daily.    [provider]  buPROPion (WELLBUTRIN XL) 150 MG 24 hr tablet Take 1 tablet (150 mg total) by mouth daily. 02/20/20 03/21/20  Para Skeans, MD  cetirizine (ZYRTEC) 10 MG tablet TAKE 1 TABLET BY MOUTH ONCE DAILY. Patient taking differently: Take 10 mg by  mouth daily. No Therapeutic Substitution 03/28/18   Diallo, Earna Coder, MD  ferrous sulfate 325 (65 FE) MG tablet Take 1 tablet (325 mg total) by mouth daily with breakfast. 02/21/20 03/22/20  Para Skeans, MD  gabapentin (NEURONTIN) 300 MG capsule Take 1 capsule (300 mg total) by mouth 2 (two) times daily. 10/23/19   Jessy Oto, MD  Melatonin 5 MG TABS Take 5 mg by mouth at bedtime as needed (sleep).  02/28/19   [provider]  metoprolol succinate (TOPROL-XL) 25 MG 24 hr tablet Take 0.5 tablets (12.5 mg total) by mouth daily. 02/20/20 03/21/20  Para Skeans, MD  mirtazapine (REMERON) 15 MG tablet Take 15 mg by mouth at bedtime.    [provider]  Multiple Vitamins-Minerals (VITRUM SENIOR) TABS Take 1 tablet by mouth daily.  04/30/15   [provider]  nicotine (NICODERM CQ - DOSED IN MG/24 HOURS) 14 mg/24hr patch Place 1 patch (14 mg total) onto the skin daily. 02/20/20   Para Skeans, MD  omeprazole (PRILOSEC) 40 MG capsule TAKE 1 CAPSULE(40 MG) BY MOUTH DAILY Patient taking differently: Take 40 mg by mouth daily. 06/03/20   Jessy Oto, MD  oxyCODONE ER [facility] ER) 9 MG C12A Take 9 mg by mouth in the morning and at bedtime. 09/05/19   Aline August, MD  pantoprazole (PROTONIX) 40 MG tablet Take 1 tablet (40 mg total) by mouth daily. 02/20/20 03/21/20  Para Skeans, MD  thiamine 100 MG tablet Take 1 tablet (100 mg total) by mouth daily. 09/05/19   Aline August, MD    Allergies    Chlorhexidine  Review of Systems   Review of Systems  Constitutional:  Positive for fever. Negative for chills and unexpected weight change.  HENT:  Negative for congestion and sore throat.   Gastrointestinal:  Negative for abdominal pain, nausea and vomiting.  Genitourinary:  Negative for dysuria.  Musculoskeletal:  Positive for arthralgias, back pain and myalgias.  Neurological:  Negative for syncope, weakness and numbness.       Negative for saddle anesthesia or bowel/bladder  incontinence.   All other systems reviewed and are negative.  Physical Exam Updated Vital Signs BP (!) 169/95 (BP Location: Right Arm)   Pulse (!) 117   Temp 99.7 F (37.6 C) (Oral)   SpO2 99%   Physical Exam Vitals and nursing note reviewed.  Constitutional:      General: He is not in acute distress.    Appearance: He is well-developed.  HENT:     Head: Normocephalic and atraumatic.     Nose: Nose normal.  Eyes:     General:        Right eye: No discharge.        Left eye: No discharge.     Conjunctiva/sclera: Conjunctivae normal.     Pupils: Pupils are equal, round, and reactive to light.  Cardiovascular:     Rate and Rhythm: Regular rhythm. Tachycardia present.     Comments: 2+ symmetric DP and PT pulses bilaterally. Abdominal:     General: There is no distension.     Palpations: Abdomen is soft.     Tenderness: There is no abdominal tenderness. There is no guarding or rebound.  Musculoskeletal:     Cervical back: Neck supple. No rigidity.     Comments: Back: Patient has a previous surgical scar to the lumbar region.  He is tender to palpation to the midline lumbar spine as well as bilateral paraspinal muscles worse on the right side.  No overlying rash. Lower extremities: Patient has prior surgical scars present to the knee and ankle of the right lower extremity.  No significant erythema, warmth, or open wounds.  He has intact active range of motion throughout, some pain in the back with movement of the right hip.  Tender to the right posterior lateral hip and the right lateral knee.  Compartments are soft.  Skin:    General: Skin is warm and dry.  Neurological:     Mental Status: He is alert.     Comments: Clear speech.  Sensation grossly intact bilateral lower extremities.  5 out of 5 strength with plantar dorsiflexion bilaterally.  Psychiatric:        Behavior: Behavior normal.        Thought Content: Thought content normal.    ED  Results / Procedures / Treatments    Labs (all labs ordered are listed, but only abnormal results are displayed) Labs Reviewed  COMPREHENSIVE METABOLIC PANEL - Abnormal; Notable for the following components:      Result Value   Sodium 131 (*)    Chloride 95 (*)    Glucose, Bld 133 (*)    All other components within normal limits  CBC WITH DIFFERENTIAL/PLATELET - Abnormal; Notable for the following components:   WBC 21.0 (*)    Hemoglobin 11.9 (*)    HCT 35.6 (*)    Neutro Abs 19.3 (*)    Lymphs Abs 0.6 (*)    Monocytes Absolute 1.1 (*)    Abs Immature Granulocytes 0.11 (*)    All other components within normal limits  URINALYSIS, ROUTINE W REFLEX MICROSCOPIC - Abnormal; Notable for the following components:   Color, Urine STRAW (*)    Specific Gravity, Urine 1.003 (*)    All other components within normal limits  RESP PANEL BY RT-PCR (FLU A&B, COVID) ARPGX2  CULTURE, BLOOD (ROUTINE X 2)  CULTURE, BLOOD (ROUTINE X 2)  LACTIC ACID, PLASMA    EKG None  Radiology DG Lumbar Spine Complete  Result Date: 11/02/2020 CLINICAL DATA:  Low back pain EXAM: LUMBAR SPINE - COMPLETE 4+ VIEW COMPARISON:  MR lumbar spine, 02/14/2020 FINDINGS: No fracture or dislocation of the lumbar spine. Focally severe disc space height loss and ankylosis of L1-L2 status post discitis osteomyelitis. Otherwise mild multilevel disc space height loss and osteophytosis, status post posterior lumbar discectomy and fusion of L4-L5. Moderate multilevel facet degenerative change. Nonobstructive pattern of overlying bowel gas. IMPRESSION: 1.  No fracture or dislocation of the lumbar spine. 2. Focally severe disc space height loss and ankylosis of L1-L2 status post discitis osteomyelitis, as seen on prior MR. 3. Otherwise mild multilevel disc space height loss and osteophytosis, status post posterior lumbar discectomy and fusion of L4-L5. 4. Lumbar disc and neural foraminal pathology may be further evaluated by MRI if indicated by neurologically localizing  signs and symptoms. Electronically Signed   By: Eddie Candle M.D.   On: 11/02/2020 16:52   MR Lumbar Spine W Wo Contrast  Result Date: 11/02/2020 CLINICAL DATA:  Low back pain with possible infection EXAM: MRI LUMBAR SPINE WITHOUT AND WITH CONTRAST TECHNIQUE: Multiplanar and multiecho pulse sequences of the lumbar spine were obtained without and with intravenous contrast. CONTRAST:  7.41m GADAVIST GADOBUTROL 1 MMOL/ML IV SOLN COMPARISON:  02/14/2020 FINDINGS: Segmentation:  Standard. Alignment: Unchanged grade 1 retrolisthesis at L2-3 grade 1 anterolisthesis at L4-5. Vertebrae: There is hyperintense T2-weighted signal and abnormal contrast enhancement within the L3 and L4 vertebral bodies. There is edema in the disc space. L4-5 PLIF. Conus medullaris and cauda equina: Conus extends to the L1 level. There is diffuse abnormal contrast enhancement within the epidural space extending the length of the lumbar spine but worst at the L3-L5 levels. Paraspinal and other soft tissues: There is edema and abnormal contrast enhancement in the medial right psoas muscle.No intramuscular abscess. Prevertebral phlegmonous change. Disc levels: L1-2: Severe disc space narrowing, unchanged. L2-3: Unchanged small disc bulge. Moderate spinal canal stenosis. Circumferential epidural enhancement effaces the thecal sac. L3-4: Unchanged small disc bulge. Circumferential epidural enhancement with severe narrowing of the thecal sac. L4-5: Intermediate sized disc bulge. PLIF. Circumferential epidural contrast enhancement severely narrows the thecal sac with crowding of the nerve roots. Findings at this level have progressed. L5-S1: Normal disc space. No spinal canal stenosis. No epidural enhancement  at this level. IMPRESSION: 1. New discitis/osteomyelitis at L3-L4 with epidural abscess/phlegmon extending the length of the lumbar spine but worst at L3-L5. This results in severe narrowing of the thecal sac at both levels with crowding of the  nerve roots. 2. Abnormal contrast enhancement in the medial right psoas muscle consistent with myositis. No intramuscular abscess. 3. Prevertebral phlegmonous change. Electronically Signed   By: Ulyses Jarred M.D.   On: 11/02/2020 22:04   DG Knee Complete 4 Views Right  Result Date: 11/02/2020 CLINICAL DATA:  Right knee pain EXAM: RIGHT KNEE - COMPLETE 4+ VIEW COMPARISON:  None. FINDINGS: Right total knee arthroplasty has been performed. Normal alignment. No acute fracture or dislocation. There is extensive heterotopic ossification adjacent to the inferior pole of the patella which may result in some degree of impingement on full extension of the knee, however, this is not well assessed on this examination. There is mild infiltration of Hoffa's fat, nonspecific in the setting of prior arthroplasty. There is thickening and mild infiltration surrounding the distal patellar tendon which may reflect trauma or inflammation involving the patellar tendon. No effusion. Mild prepatellar soft tissue swelling. IMPRESSION: Mild prepatellar soft tissue swelling with more focal thickening and peritendinous infiltration surrounding the distal patellar tendon which may reflect inflammation or trauma involving this structure. This may be better assessed with MRI examination. Extensive heterotopic ossification surrounding the inferior pole of the patella which may result in impingement of full extension of the right knee. Correlation with clinical examination is recommended. No acute fracture or dislocation. Electronically Signed   By: Fidela Salisbury MD   On: 11/02/2020 16:57   DG Hip Unilat With Pelvis 2-3 Views Right  Result Date: 11/02/2020 CLINICAL DATA:  Low back pain radiating down right leg EXAM: DG HIP (WITH OR WITHOUT PELVIS) 2-3V RIGHT COMPARISON:  None. FINDINGS: There is no evidence of hip fracture or dislocation. There is no evidence of arthropathy or other focal bone abnormality. IMPRESSION: No displaced fracture  or dislocation of the right hip. Joint spaces are preserved. Electronically Signed   By: Eddie Candle M.D.   On: 11/02/2020 16:49    Procedures .Critical Care  Date/Time: 11/03/2020 12:59 AM Performed by: Amaryllis Dyke, PA-C Authorized by: Amaryllis Dyke, PA-C    CRITICAL CARE Performed by: Kennith Maes   Total critical care time: 35 minutes  Critical care time was exclusive of separately billable procedures and treating other patients.  Critical care was necessary to treat or prevent imminent or life-threatening deterioration.  Critical care was time spent personally by me on the following activities: development of treatment plan with patient and/or surrogate as well as nursing, discussions with consultants, evaluation of patient's response to treatment, examination of patient, obtaining history from patient or surrogate, ordering and performing treatments and interventions, ordering and review of laboratory studies, ordering and review of radiographic studies, pulse oximetry and re-evaluation of patient's condition.   Medications Ordered in ED Medications - No data to display  ED Course  I have reviewed the triage vital signs and the nursing notes.  Pertinent labs & imaging results that were available during my care of the patient were reviewed by me and considered in my medical decision making (see chart for details).    MDM Rules/Calculators/A&P                           Patient presents to the ED with complaints of worsening back pain x  3 days.  Nontoxic, tachycardic, BP mildly elevated. Plan for basic labs & x-rays for initial care, but anticipate MRI of the L spine for further assessment.   Lab Tests:  I Ordered, reviewed, and interpreted labs, which included:  CBC: Leukocytosis @ 21,000 CMP: mildly hyponatremic/chloremic. Renal function preserved.  UA: unremarkable.  Covid/flu: Negative  Imaging Studies ordered:  I ordered imaging studies  which included X-rays of the L spine, right hip, and right knee, I independently reviewed, formal radiology impression shows:  X-rays:  Right hip: No displaced fracture or dislocation of the right hip. Joint spaces are preserved  Right knee: Mild prepatellar soft tissue swelling with more focal thickening and peritendinous infiltration surrounding the distal patellar tendon which may reflect inflammation or trauma involving this structure. This may be better assessed with MRI examination. Extensive heterotopic ossification surrounding the inferior pole of the patella which may result in impingement of full extension of the right knee. Correlation with clinical examination is recommended. No acute fracture or dislocation  L spine: 1.  No fracture or dislocation of the lumbar spine. 2. Focally severe disc space height loss and ankylosis of L1-L2 status post discitis osteomyelitis, as seen on prior MR. 3. Otherwise mild multilevel disc space height loss and osteophytosis, status post posterior lumbar discectomy and fusion of L4-L5. 4. Lumbar disc and neural foraminal pathology may be further evaluated by MRI if indicated by neurologically localizing signs and symptoms  ED Course:  X-ray reads as above. R hip/knee without erythema/warmth, fairly intact ROM, low suspicion for septic joint in either of these locations at this time. Concern for epidural abscess/discitis/osteomyelitis given hx of same with most recent admission October 2021 for this. Will proceed with MRI L spine w/wo contrast. With patient's tachycardia & leukocytosis will add on lactic acid & blood cultures as well- he is not hypotensive or toxic appearing. .   Additional history obtained:  Additional history obtained from chart review & nursing note review.  - Patient had surgery with Dr. Louanne Skye in January 2021- Transformanial lumbar interbody fusion L4-5 with pedicle screws, rods, cages, local bone graft, allograft & vivgen, central  decompression L4-5, left L3-4 microdisectomy. - Admit April 2021- found to have osteomyelitis/discitis with epidural phlegmon not amenable to drainage- ID recommend 6 weeks of IV abx therapy- vanc/ceftriaxone.  - Admit October 2021- Recurrent discitis, osteomyelitis L3-4 with associated epidural phlegmon--> seen by neurosurgery during admission- recommended bx into disc space by IR @ L1-L2 level, attempted by IR- unable to pass x 2- Disc aspiration unsuccessful due to severe disc space narrowing and  overhanging degenerative osteophytes- IV abx x 6 weeks- vanc/cefepime then dalbavancin @ DC with cipro x 8 weeks given recurrence.   Lactic acid WNL  MRI L spine: New discitis/osteomyelitis with epidural abscess/phlegmon, myositis, and prevertebral phlegmonous change as detailed above. Will discuss with neurosurgery  22:44: CONSULT: Discussed with OR Staff- Dr. Zada Finders is operating, he relays to hold abx and will call back shortly.   Will discuss with hospitalist service as overall patient will require admission.   00:01: CONSULT: Discussed with hospitalist Dr. Hal Hope- would like to know if neurosurgery is planning to take to OR prior to placing admission orders, requesting call back.   00:14: Re-discussed with Dr. Zada Finders- recommends discussion with orthopedic surgery service that performed his initial back procedure, if they do not wish to intervene or would prefer we re-discuss with neurosurgery recommends admission to [facility] to hospitalist service- will take to OR this morning.  00:48: CONSULT: Discussed with Annie Main PA with [facility]oc[facility]- plan to review imaging & call back.   00:50: Patient care signed out to Charlann Lange PA-C at change of shift pending call back from consultant and admission.   Findings and plan of care discussed with supervising physician Dr. Johnney Killian who evaluated patient & is in agreement.   Portions of this note were generated with Theatre manager. Dictation errors may occur despite best attempts at proofreading.  Final Clinical Impression(s) / ED Diagnoses Final diagnoses:  Discitis of lumbar region  Osteomyelitis of lumbar spine ([place])  Epidural abscess    Rx / DC Orders ED Discharge Orders     None        Amaryllis Dyke, PA-C 11/03/20 0102    Charlesetta Shanks, MD 12/01/20 1624

## 2020-11-03 ENCOUNTER — Encounter (HOSPITAL_COMMUNITY)
Admission: EM | Disposition: A | Discharge status: Home / Self Care | Payer: Self-pay | Source: Home / Self Care | Attending: Internal Medicine

## 2020-11-03 ENCOUNTER — Inpatient Hospital Stay (HOSPITAL_COMMUNITY): Payer: Medicare Other | Admitting: Certified Registered Nurse Anesthetist

## 2020-11-03 ENCOUNTER — Encounter (HOSPITAL_COMMUNITY): Payer: Self-pay | Admitting: Internal Medicine

## 2020-11-03 DIAGNOSIS — G061 Intraspinal abscess and granuloma: Secondary | ICD-10-CM | POA: Diagnosis present

## 2020-11-03 DIAGNOSIS — Z981 Arthrodesis status: Secondary | ICD-10-CM | POA: Diagnosis not present

## 2020-11-03 DIAGNOSIS — G062 Extradural and subdural abscess, unspecified: Secondary | ICD-10-CM | POA: Diagnosis not present

## 2020-11-03 DIAGNOSIS — F17210 Nicotine dependence, cigarettes, uncomplicated: Secondary | ICD-10-CM | POA: Diagnosis not present

## 2020-11-03 DIAGNOSIS — E871 Hypo-osmolality and hyponatremia: Secondary | ICD-10-CM | POA: Diagnosis present

## 2020-11-03 DIAGNOSIS — I10 Essential (primary) hypertension: Secondary | ICD-10-CM

## 2020-11-03 DIAGNOSIS — Z20822 Contact with and (suspected) exposure to covid-19: Secondary | ICD-10-CM | POA: Diagnosis present

## 2020-11-03 DIAGNOSIS — E876 Hypokalemia: Secondary | ICD-10-CM | POA: Diagnosis not present

## 2020-11-03 DIAGNOSIS — F209 Schizophrenia, unspecified: Secondary | ICD-10-CM | POA: Diagnosis present

## 2020-11-03 DIAGNOSIS — B965 Pseudomonas (aeruginosa) (mallei) (pseudomallei) as the cause of diseases classified elsewhere: Secondary | ICD-10-CM | POA: Diagnosis not present

## 2020-11-03 DIAGNOSIS — G629 Polyneuropathy, unspecified: Secondary | ICD-10-CM | POA: Diagnosis not present

## 2020-11-03 DIAGNOSIS — Z96651 Presence of right artificial knee joint: Secondary | ICD-10-CM | POA: Diagnosis not present

## 2020-11-03 DIAGNOSIS — Z8673 Personal history of transient ischemic attack (TIA), and cerebral infarction without residual deficits: Secondary | ICD-10-CM | POA: Diagnosis not present

## 2020-11-03 DIAGNOSIS — Z79899 Other long term (current) drug therapy: Secondary | ICD-10-CM | POA: Diagnosis not present

## 2020-11-03 DIAGNOSIS — J449 Chronic obstructive pulmonary disease, unspecified: Secondary | ICD-10-CM | POA: Diagnosis present

## 2020-11-03 DIAGNOSIS — R7303 Prediabetes: Secondary | ICD-10-CM | POA: Diagnosis present

## 2020-11-03 DIAGNOSIS — K6812 Psoas muscle abscess: Secondary | ICD-10-CM | POA: Diagnosis present

## 2020-11-03 DIAGNOSIS — D649 Anemia, unspecified: Secondary | ICD-10-CM | POA: Diagnosis present

## 2020-11-03 DIAGNOSIS — M4646 Discitis, unspecified, lumbar region: Secondary | ICD-10-CM | POA: Diagnosis present

## 2020-11-03 DIAGNOSIS — M4626 Osteomyelitis of vertebra, lumbar region: Secondary | ICD-10-CM | POA: Diagnosis present

## 2020-11-03 DIAGNOSIS — K219 Gastro-esophageal reflux disease without esophagitis: Secondary | ICD-10-CM | POA: Diagnosis not present

## 2020-11-03 HISTORY — PX: THORACIC LAMINECTOMY FOR EPIDURAL ABSCESS: SHX6115

## 2020-11-03 LAB — SURGICAL PCR SCREEN
MRSA, PCR: NEGATIVE
Staphylococcus aureus: NEGATIVE

## 2020-11-03 LAB — CBG MONITORING, ED
Glucose-Capillary: 126 mg/dL — ABNORMAL HIGH (ref 70–99)
Glucose-Capillary: 129 mg/dL — ABNORMAL HIGH (ref 70–99)
Glucose-Capillary: 125 mg/dL — ABNORMAL HIGH (ref 70–99)

## 2020-11-03 LAB — HEMOGLOBIN A1C
Hgb A1c MFr Bld: 5.9 % — ABNORMAL HIGH (ref 4.8–5.6)
Mean Plasma Glucose: 122.63 mg/dL

## 2020-11-03 LAB — GLUCOSE, CAPILLARY: Glucose-Capillary: 109 mg/dL — ABNORMAL HIGH (ref 70–99)

## 2020-11-03 LAB — RAPID URINE DRUG SCREEN, HOSP PERFORMED
Amphetamines: NOT DETECTED
Barbiturates: NOT DETECTED
Benzodiazepines: NOT DETECTED
Cocaine: POSITIVE — AB
Opiates: POSITIVE — AB
Tetrahydrocannabinol: NOT DETECTED

## 2020-11-03 LAB — HIV ANTIBODY (ROUTINE TESTING W REFLEX): HIV Screen 4th Generation wRfx: NONREACTIVE

## 2020-11-03 SURGERY — THORACIC LAMINECTOMY FOR EPIDURAL ABSCESS
Anesthesia: General | Site: Back

## 2020-11-03 MED ORDER — HYDROMORPHONE HCL 1 MG/ML IJ SOLN
1.0000 mg | INTRAMUSCULAR | Status: DC | PRN
  Administered 2020-11-03 – 2020-11-04 (×9): 1 mg via INTRAVENOUS
  Filled 2020-11-03 (×9): qty 1

## 2020-11-03 MED ORDER — OXYCODONE HCL 5 MG PO TABS
5.0000 mg | ORAL_TABLET | ORAL | Status: DC | PRN
  Administered 2020-11-03 – 2020-11-04 (×2): 5 mg via ORAL
  Filled 2020-11-03 (×2): qty 1

## 2020-11-03 MED ORDER — ALBUTEROL SULFATE (2.5 MG/3ML) 0.083% IN NEBU
2.5000 mg | INHALATION_SOLUTION | Freq: Two times a day (BID) | RESPIRATORY_TRACT | Status: DC | PRN
Start: 2020-11-03 — End: 2020-11-18

## 2020-11-03 MED ORDER — CEFAZOLIN SODIUM-DEXTROSE 2-3 GM-%(50ML) IV SOLR
INTRAVENOUS | Status: DC | PRN
  Administered 2020-11-03: 2 g via INTRAVENOUS

## 2020-11-03 MED ORDER — MIDAZOLAM HCL 2 MG/2ML IJ SOLN
INTRAMUSCULAR | Status: DC | PRN
  Administered 2020-11-03: 2 mg via INTRAVENOUS

## 2020-11-03 MED ORDER — ONDANSETRON HCL 4 MG/2ML IJ SOLN
INTRAMUSCULAR | Status: AC
  Filled 2020-11-03: qty 2

## 2020-11-03 MED ORDER — ACETAMINOPHEN 10 MG/ML IV SOLN
INTRAVENOUS | Status: AC
  Filled 2020-11-03: qty 100

## 2020-11-03 MED ORDER — LIDOCAINE-EPINEPHRINE 1 %-1:100000 IJ SOLN
INTRAMUSCULAR | Status: AC
  Filled 2020-11-03: qty 1

## 2020-11-03 MED ORDER — ORAL CARE MOUTH RINSE: 15.0000 mL | Freq: Once | OROMUCOSAL | Status: DC

## 2020-11-03 MED ORDER — LABETALOL HCL 5 MG/ML IV SOLN
INTRAVENOUS | Status: AC
  Filled 2020-11-03: qty 4

## 2020-11-03 MED ORDER — DEXAMETHASONE SODIUM PHOSPHATE 10 MG/ML IJ SOLN
INTRAMUSCULAR | Status: DC | PRN
  Administered 2020-11-03: 10 mg via INTRAVENOUS

## 2020-11-03 MED ORDER — IPRATROPIUM-ALBUTEROL 0.5-2.5 (3) MG/3ML IN SOLN: 3.0000 mL | RESPIRATORY_TRACT | Status: DC | PRN

## 2020-11-03 MED ORDER — BUPROPION HCL ER (XL) 150 MG PO TB24
300.0000 mg | ORAL_TABLET | Freq: Every day | ORAL | Status: DC
  Administered 2020-11-04 – 2020-11-07 (×4): 300 mg via ORAL
  Filled 2020-11-03 (×4): qty 2

## 2020-11-03 MED ORDER — TRAZODONE HCL 50 MG PO TABS: 50.0000 mg | ORAL_TABLET | Freq: Every evening | ORAL | Status: DC | PRN

## 2020-11-03 MED ORDER — DM-GUAIFENESIN ER 30-600 MG PO TB12
1.0000 | ORAL_TABLET | Freq: Two times a day (BID) | ORAL | Status: DC | PRN
  Administered 2020-11-07: 1 via ORAL
  Filled 2020-11-03: qty 1

## 2020-11-03 MED ORDER — LIDOCAINE 2% (20 MG/ML) 5 ML SYRINGE
INTRAMUSCULAR | Status: DC | PRN
  Administered 2020-11-03: 80 mg via INTRAVENOUS

## 2020-11-03 MED ORDER — PROPOFOL 10 MG/ML IV BOLUS
INTRAVENOUS | Status: AC
  Filled 2020-11-03: qty 20

## 2020-11-03 MED ORDER — CHLORHEXIDINE GLUCONATE 0.12 % MT SOLN: 15.0000 mL | Freq: Once | OROMUCOSAL | Status: DC

## 2020-11-03 MED ORDER — SODIUM CHLORIDE 0.9 % IV SOLN: INTRAVENOUS | Status: AC

## 2020-11-03 MED ORDER — CEFAZOLIN SODIUM-DEXTROSE 2-4 GM/100ML-% IV SOLN
INTRAVENOUS | Status: AC
  Filled 2020-11-03: qty 100

## 2020-11-03 MED ORDER — PROPOFOL 10 MG/ML IV BOLUS
INTRAVENOUS | Status: DC | PRN
Start: 2020-11-03 — End: 2020-11-03
  Administered 2020-11-03: 150 mg via INTRAVENOUS

## 2020-11-03 MED ORDER — NICOTINE 14 MG/24HR TD PT24
14.0000 mg | MEDICATED_PATCH | Freq: Every day | TRANSDERMAL | Status: DC
  Administered 2020-11-03 – 2020-11-17 (×15): 14 mg via TRANSDERMAL
  Filled 2020-11-03 (×16): qty 1

## 2020-11-03 MED ORDER — METOPROLOL TARTRATE 5 MG/5ML IV SOLN: 5.0000 mg | INTRAVENOUS | Status: DC | PRN

## 2020-11-03 MED ORDER — PHENYLEPHRINE HCL-NACL 20-0.9 MG/250ML-% IV SOLN
INTRAVENOUS | Status: DC | PRN
  Administered 2020-11-03: 25 ug/min via INTRAVENOUS

## 2020-11-03 MED ORDER — ESMOLOL HCL 100 MG/10ML IV SOLN
INTRAVENOUS | Status: DC | PRN
  Administered 2020-11-03: 20 mg via INTRAVENOUS

## 2020-11-03 MED ORDER — ONDANSETRON HCL 4 MG PO TABS: 4.0000 mg | ORAL_TABLET | Freq: Three times a day (TID) | ORAL | Status: DC | PRN

## 2020-11-03 MED ORDER — THROMBIN 5000 UNITS EX SOLR
CUTANEOUS | Status: AC
  Filled 2020-11-03: qty 5000

## 2020-11-03 MED ORDER — HYDRALAZINE HCL 20 MG/ML IJ SOLN: 10.0000 mg | INTRAMUSCULAR | Status: DC | PRN

## 2020-11-03 MED ORDER — ESMOLOL HCL 100 MG/10ML IV SOLN
INTRAVENOUS | Status: AC
  Filled 2020-11-03: qty 10

## 2020-11-03 MED ORDER — DEXAMETHASONE SODIUM PHOSPHATE 10 MG/ML IJ SOLN
INTRAMUSCULAR | Status: AC
  Filled 2020-11-03: qty 1

## 2020-11-03 MED ORDER — FERROUS SULFATE 325 (65 FE) MG PO TABS
325.0000 mg | ORAL_TABLET | Freq: Every day | ORAL | Status: DC
  Administered 2020-11-04 – 2020-11-18 (×15): 325 mg via ORAL
  Filled 2020-11-03 (×15): qty 1

## 2020-11-03 MED ORDER — ACETAMINOPHEN 10 MG/ML IV SOLN
INTRAVENOUS | Status: DC | PRN
  Administered 2020-11-03: 1000 mg via INTRAVENOUS

## 2020-11-03 MED ORDER — THROMBIN 20000 UNITS EX SOLR
CUTANEOUS | Status: AC
  Filled 2020-11-03: qty 20000

## 2020-11-03 MED ORDER — ROCURONIUM BROMIDE 10 MG/ML (PF) SYRINGE
PREFILLED_SYRINGE | INTRAVENOUS | Status: DC | PRN
  Administered 2020-11-03: 80 mg via INTRAVENOUS

## 2020-11-03 MED ORDER — HYDROMORPHONE HCL 1 MG/ML IJ SOLN
INTRAMUSCULAR | Status: AC
  Filled 2020-11-03: qty 0.5

## 2020-11-03 MED ORDER — METOPROLOL SUCCINATE ER 25 MG PO TB24: 50.0000 mg | ORAL_TABLET | Freq: Once | ORAL | Status: AC

## 2020-11-03 MED ORDER — FENTANYL CITRATE (PF) 250 MCG/5ML IJ SOLN
INTRAMUSCULAR | Status: AC
  Filled 2020-11-03: qty 5

## 2020-11-03 MED ORDER — MELATONIN 5 MG PO TABS
10.0000 mg | ORAL_TABLET | Freq: Every day | ORAL | Status: DC
  Administered 2020-11-03 – 2020-11-17 (×14): 10 mg via ORAL
  Filled 2020-11-03 (×14): qty 2

## 2020-11-03 MED ORDER — METOPROLOL SUCCINATE ER 50 MG PO TB24
50.0000 mg | ORAL_TABLET | Freq: Every day | ORAL | Status: DC
  Administered 2020-11-04 – 2020-11-07 (×4): 50 mg via ORAL
  Filled 2020-11-03 (×4): qty 1

## 2020-11-03 MED ORDER — ROCURONIUM BROMIDE 10 MG/ML (PF) SYRINGE
PREFILLED_SYRINGE | INTRAVENOUS | Status: AC
  Filled 2020-11-03: qty 10

## 2020-11-03 MED ORDER — ALBUMIN HUMAN 5 % IV SOLN: INTRAVENOUS | Status: DC | PRN

## 2020-11-03 MED ORDER — MIDAZOLAM HCL 2 MG/2ML IJ SOLN
INTRAMUSCULAR | Status: AC
  Filled 2020-11-03: qty 2

## 2020-11-03 MED ORDER — INSULIN ASPART 100 UNIT/ML IJ SOLN
0.0000 [IU] | INTRAMUSCULAR | Status: DC
  Administered 2020-11-03 – 2020-11-04 (×7): 1 [IU] via SUBCUTANEOUS

## 2020-11-03 MED ORDER — BUPIVACAINE HCL (PF) 0.25 % IJ SOLN
INTRAMUSCULAR | Status: AC
  Filled 2020-11-03: qty 30

## 2020-11-03 MED ORDER — THROMBIN 20000 UNITS EX SOLR
CUTANEOUS | Status: DC | PRN
  Administered 2020-11-03: 20 mL via TOPICAL

## 2020-11-03 MED ORDER — LACTATED RINGERS IV SOLN: INTRAVENOUS | Status: DC

## 2020-11-03 MED ORDER — FENTANYL CITRATE (PF) 250 MCG/5ML IJ SOLN
INTRAMUSCULAR | Status: DC | PRN
  Administered 2020-11-03 (×5): 50 ug via INTRAVENOUS

## 2020-11-03 MED ORDER — HYDROMORPHONE HCL 1 MG/ML IJ SOLN
INTRAMUSCULAR | Status: DC | PRN
  Administered 2020-11-03: .5 mg via INTRAVENOUS

## 2020-11-03 MED ORDER — METOPROLOL SUCCINATE ER 25 MG PO TB24
ORAL_TABLET | ORAL | Status: AC
  Administered 2020-11-03: 50 mg via ORAL
  Filled 2020-11-03: qty 2

## 2020-11-03 MED ORDER — HYDROMORPHONE HCL 1 MG/ML IJ SOLN: 0.2500 mg | INTRAMUSCULAR | Status: DC | PRN

## 2020-11-03 MED ORDER — LIDOCAINE 2% (20 MG/ML) 5 ML SYRINGE
INTRAMUSCULAR | Status: AC
  Filled 2020-11-03: qty 5

## 2020-11-03 MED ORDER — BENZTROPINE MESYLATE 1 MG PO TABS
1.0000 mg | ORAL_TABLET | Freq: Every day | ORAL | Status: DC
  Administered 2020-11-04 – 2020-11-06 (×3): 1 mg via ORAL
  Filled 2020-11-03 (×4): qty 1

## 2020-11-03 MED ORDER — HYDRALAZINE HCL 20 MG/ML IJ SOLN
10.0000 mg | INTRAMUSCULAR | Status: DC | PRN
  Administered 2020-11-08: 10 mg via INTRAVENOUS
  Filled 2020-11-03: qty 1

## 2020-11-03 MED ORDER — MIRTAZAPINE 15 MG PO TABS
15.0000 mg | ORAL_TABLET | Freq: Every day | ORAL | Status: DC
  Administered 2020-11-03 – 2020-11-06 (×4): 15 mg via ORAL
  Filled 2020-11-03 (×4): qty 1

## 2020-11-03 MED ORDER — SENNOSIDES-DOCUSATE SODIUM 8.6-50 MG PO TABS: 1.0000 | ORAL_TABLET | Freq: Every evening | ORAL | Status: DC | PRN

## 2020-11-03 MED ORDER — 0.9 % SODIUM CHLORIDE (POUR BTL) OPTIME
TOPICAL | Status: DC | PRN
  Administered 2020-11-03: 1000 mL

## 2020-11-03 MED ORDER — ONDANSETRON HCL 4 MG/2ML IJ SOLN
INTRAMUSCULAR | Status: DC | PRN
  Administered 2020-11-03: 4 mg via INTRAVENOUS

## 2020-11-03 MED ORDER — THROMBIN 5000 UNITS EX SOLR
OROMUCOSAL | Status: DC | PRN
  Administered 2020-11-03: 5 mL via TOPICAL

## 2020-11-03 MED ORDER — PANTOPRAZOLE SODIUM 40 MG PO TBEC
40.0000 mg | DELAYED_RELEASE_TABLET | Freq: Every day | ORAL | Status: DC
  Administered 2020-11-04 – 2020-11-18 (×15): 40 mg via ORAL
  Filled 2020-11-03 (×15): qty 1

## 2020-11-03 MED ORDER — SUGAMMADEX SODIUM 200 MG/2ML IV SOLN
INTRAVENOUS | Status: DC | PRN
  Administered 2020-11-03: 300 mg via INTRAVENOUS

## 2020-11-03 MED ORDER — LABETALOL HCL 5 MG/ML IV SOLN
INTRAVENOUS | Status: DC | PRN
  Administered 2020-11-03 (×2): 5 mg via INTRAVENOUS

## 2020-11-03 MED ORDER — LIDOCAINE-EPINEPHRINE 1 %-1:100000 IJ SOLN
INTRAMUSCULAR | Status: DC | PRN
  Administered 2020-11-03: 10 mL via INTRADERMAL

## 2020-11-03 MED ORDER — GABAPENTIN 300 MG PO CAPS
300.0000 mg | ORAL_CAPSULE | Freq: Two times a day (BID) | ORAL | Status: DC | PRN
  Administered 2020-11-03 – 2020-11-06 (×3): 300 mg via ORAL
  Filled 2020-11-03 (×3): qty 1

## 2020-11-03 SURGICAL SUPPLY — 55 items
BAG COUNTER SPONGE SURGICOUNT (BAG) ×2 IMPLANT
BAND RUBBER #18 3X1/16 STRL (MISCELLANEOUS) IMPLANT
BENZOIN TINCTURE PRP APPL 2/3 (GAUZE/BANDAGES/DRESSINGS) IMPLANT
BLADE SURG 11 STRL SS (BLADE) ×2 IMPLANT
BUR ACRON 5.0MM COATED (BURR) IMPLANT
BUR MATCHSTICK NEURO 3.0 LAGG (BURR) IMPLANT
BUR PRECISION FLUTE 5.0 (BURR) ×2 IMPLANT
CANISTER SUCT 3000ML PPV (MISCELLANEOUS) ×2 IMPLANT
CARTRIDGE OIL MAESTRO DRILL (MISCELLANEOUS) ×1 IMPLANT
CLIP VESOCCLUDE MED 6/CT (CLIP) IMPLANT
DERMABOND ADVANCED (GAUZE/BANDAGES/DRESSINGS) ×1
DERMABOND ADVANCED .7 DNX12 (GAUZE/BANDAGES/DRESSINGS) ×1 IMPLANT
DIFFUSER DRILL AIR PNEUMATIC (MISCELLANEOUS) ×2 IMPLANT
DRAPE LAPAROTOMY 100X72 PEDS (DRAPES) IMPLANT
DRAPE LAPAROTOMY 100X72X124 (DRAPES) IMPLANT
DRAPE MICROSCOPE LEICA (MISCELLANEOUS) IMPLANT
ELECT REM PT RETURN 9FT ADLT (ELECTROSURGICAL) ×2
ELECTRODE REM PT RTRN 9FT ADLT (ELECTROSURGICAL) ×1 IMPLANT
GAUZE 4X4 16PLY ~~LOC~~+RFID DBL (SPONGE) IMPLANT
GAUZE SPONGE 4X4 12PLY STRL (GAUZE/BANDAGES/DRESSINGS) IMPLANT
GLOVE EXAM NITRILE XL STR (GLOVE) IMPLANT
GLOVE SURG LTX SZ7 (GLOVE) ×6 IMPLANT
GLOVE SURG LTX SZ7.5 (GLOVE) ×2 IMPLANT
GLOVE SURG UNDER POLY LF SZ7.5 (GLOVE) ×6 IMPLANT
GOWN STRL REUS W/ TWL LRG LVL3 (GOWN DISPOSABLE) IMPLANT
GOWN STRL REUS W/ TWL XL LVL3 (GOWN DISPOSABLE) IMPLANT
GOWN STRL REUS W/TWL 2XL LVL3 (GOWN DISPOSABLE) IMPLANT
GOWN STRL REUS W/TWL LRG LVL3 (GOWN DISPOSABLE)
GOWN STRL REUS W/TWL XL LVL3 (GOWN DISPOSABLE)
HEMOSTAT SURGICEL 2X14 (HEMOSTASIS) IMPLANT
KIT BASIN OR (CUSTOM PROCEDURE TRAY) ×2 IMPLANT
KIT TURNOVER KIT B (KITS) ×2 IMPLANT
NEEDLE SPNL 22GX3.5 QUINCKE BK (NEEDLE) ×2 IMPLANT
NS IRRIG 1000ML POUR BTL (IV SOLUTION) ×2 IMPLANT
OIL CARTRIDGE MAESTRO DRILL (MISCELLANEOUS) ×2
PACK LAMINECTOMY NEURO (CUSTOM PROCEDURE TRAY) ×2 IMPLANT
PAD ARMBOARD 7.5X6 YLW CONV (MISCELLANEOUS) ×6 IMPLANT
PATTIES SURGICAL .25X.25 (GAUZE/BANDAGES/DRESSINGS) IMPLANT
PATTIES SURGICAL .5 X3 (DISPOSABLE) IMPLANT
PATTIES SURGICAL 1/4 X 3 (GAUZE/BANDAGES/DRESSINGS) IMPLANT
SPECIMEN JAR SMALL (MISCELLANEOUS) IMPLANT
SPONGE NEURO XRAY DETECT 1X3 (DISPOSABLE) IMPLANT
SPONGE SURGIFOAM ABS GEL 100 (HEMOSTASIS) ×2 IMPLANT
SPONGE T-LAP 4X18 ~~LOC~~+RFID (SPONGE) IMPLANT
STRIP CLOSURE SKIN 1/2X4 (GAUZE/BANDAGES/DRESSINGS) IMPLANT
SUT PROLENE 6 0 BV (SUTURE) IMPLANT
SUT VIC AB 0 CT1 18XCR BRD8 (SUTURE) ×1 IMPLANT
SUT VIC AB 0 CT1 8-18 (SUTURE) ×1
SUT VIC AB 2-0 CP2 18 (SUTURE) ×2 IMPLANT
SWAB COLLECTION DEVICE MRSA (MISCELLANEOUS) IMPLANT
SWAB CULTURE ESWAB REG 1ML (MISCELLANEOUS) IMPLANT
TOWEL GREEN STERILE (TOWEL DISPOSABLE) ×2 IMPLANT
TOWEL GREEN STERILE FF (TOWEL DISPOSABLE) ×2 IMPLANT
TRAY FOLEY MTR SLVR 16FR STAT (SET/KITS/TRAYS/PACK) IMPLANT
WATER STERILE IRR 1000ML POUR (IV SOLUTION) ×2 IMPLANT

## 2020-11-03 NOTE — Op Note (Signed)
PATIENT: [name]  DAY OF SURGERY: 11/03/20   PRE-OPERATIVE DIAGNOSIS:  Lumbar epidural abscess   POST-OPERATIVE DIAGNOSIS:  Same   PROCEDURE:  L3, L4 laminectomies, evacuation of epidural abscess   SURGEON:  Surgeon(s) and Role:    Judith Part, MD - Primary   ANESTHESIA: ETGA   BRIEF HISTORY: This is a 55 year old man with prior surgery by another surgeon who presented with severe low back pain and some right lower extremity radicular pain. He has a history of prior discitis, denied IVDU but tox screen showed opiates and cocaine. MRI showed new discitis with epidural abscess resulting in severe nerve root compression at L4-5 and L3-4. I therefore recommended evacuation. I advised the patient that continued spinal infections will lead to worsening disease and overall health and advised him to stop whichever behaviors may be contributing to them. We discussed risks, benefits, and alternatives regarding surgery, I advised him this is to prevent worsening neurologic function and get cultures, it will not change his chronic back pain. He wished to proceed with surgery.    OPERATIVE DETAIL: The patient was taken to the operating room and anesthesia was induced by the anesthesia team. They were placed on the OR table in the prone position with padding of all pressure points. A formal time out was performed with two patient identifiers and confirmed the operative site. The operative site was marked, hair was clipped with surgical clippers, the area was then prepped and draped in a sterile fashion. The patient's prior midline lumbar incision was used to expose L3 and L4 and the old hardware. Subperiosteal dissection was performed bilaterally.   Pus was present tracking along the right paraspinals and along the right L4 screw, cultures were taken x2 here before antibiotics were administered. Decompression was then performed, which consisted of L3 and L4 laminectomies, created with a  combination of high speed drill and rongeurs. There was purulent material encountered in the right lateral recess and a large amount of phlegmon that was removed. The thecal sac was well decompressed, hemostasis was obtained and confirmed.  All instrument and sponge counts were correct, the incision was then closed in layers. The patient was then returned to anesthesia for emergence. No apparent complications at the completion of the procedure.   EBL:  22m   DRAINS: none   SPECIMENS: Epidural cultures x2   TJudith Part MD 11/03/20 1:46 PM

## 2020-11-03 NOTE — Progress Notes (Signed)
ID Brief Note  Attempted to see patient, patient is away to OR OK to start Vancomycin, pharmacy to dose and cefepime after OR Please send samples for gram stain and routine aerobic and anaerobic cultures Full consult to follow tomorrow Discussed with primary  Rosiland Oz, MD Infectious Disease Physician [facility] [address] [place], [place] [address] Phone: [phone]  Fax: [phone]

## 2020-11-03 NOTE — Anesthesia Preprocedure Evaluation (Addendum)
Anesthesia Evaluation  Patient identified by MRN, date of birth, ID band Patient awake    Reviewed: Allergy & Precautions, H&P , NPO status , Patient's Chart, lab work & pertinent test results, reviewed documented beta blocker date and time   Airway Mallampati: II  TM Distance: >3 FB Neck ROM: Full    Dental no notable dental hx. (+) Edentulous Upper, Poor Dentition, Dental Advisory Given   Pulmonary asthma , Current Smoker,    Pulmonary exam normal breath sounds clear to auscultation       Cardiovascular hypertension, Pt. on medications and Pt. on home beta blockers  Rhythm:Regular Rate:Normal     Neuro/Psych Anxiety Depression CVA, No Residual Symptoms    GI/Hepatic Neg liver ROS, GERD  Medicated,  Endo/Other  negative endocrine ROS  Renal/GU negative Renal ROS  negative genitourinary   Musculoskeletal  (+) Arthritis , Osteoarthritis,    Abdominal   Peds  Hematology  (+) Blood dyscrasia, anemia ,   Anesthesia Other Findings   Reproductive/Obstetrics negative OB ROS                            Anesthesia Physical Anesthesia Plan  ASA: 3  Anesthesia Plan: General   Post-op Pain Management:    Induction: Intravenous  PONV Risk Score and Plan: 2 and Ondansetron, Dexamethasone and Midazolam  Airway Management Planned: Oral ETT  Additional Equipment:   Intra-op Plan:   Post-operative Plan: Extubation in OR  Informed Consent: I have reviewed the patients History and Physical, chart, labs and discussed the procedure including the risks, benefits and alternatives for the proposed anesthesia with the patient or authorized representative who has indicated his/her understanding and acceptance.     Dental advisory given  Plan Discussed with: CRNA  Anesthesia Plan Comments:         Anesthesia Quick Evaluation

## 2020-11-03 NOTE — ED Notes (Signed)
Paged ortho care [place]

## 2020-11-03 NOTE — Anesthesia Procedure Notes (Signed)
Procedure Name: Intubation Date/Time: 11/03/2020 3:20 PM Performed by: Clearnce Sorrel, CRNA Pre-anesthesia Checklist: Patient identified, Emergency Drugs available, Suction available and Patient being monitored Patient Re-evaluated:Patient Re-evaluated prior to induction Oxygen Delivery Method: Circle System Utilized Preoxygenation: Pre-oxygenation with 100% oxygen Induction Type: IV induction Ventilation: Mask ventilation without difficulty Laryngoscope Size: Mac and 4 Grade View: Grade I Tube type: Oral Tube size: 7.5 mm Number of attempts: 1 Airway Equipment and Method: Stylet and Oral airway Placement Confirmation: ETT inserted through vocal cords under direct vision, positive ETCO2 and breath sounds checked- equal and bilateral Secured at: 23 cm Tube secured with: Tape Dental Injury: Teeth and Oropharynx as per pre-operative assessment

## 2020-11-03 NOTE — ED Notes (Signed)
[facility] contacted, transport arranged to [facility] ED.

## 2020-11-03 NOTE — ED Notes (Signed)
Pt given Dilaudid '1mg'$  by [facility] just before leaving [facility] @ 0300, Pt requesting more Dilaudid at this time, explained to patient it was not time for additional doses.

## 2020-11-03 NOTE — Progress Notes (Signed)
PROGRESS NOTE    [name]  VWU:[id number] DOB: 10/28/1965 DOA: 11/02/2020 PCP: [name], MD   Brief Narrative:  55 year old with recurrent discitis admitted October 2021 for lumbar discitis with phlegmon require prolonged antibiotic course admitted for lower back pain for 2 days.  MRI showed epidural abscess and right-sided psoas myositis, neurosurgery consulted.   Assessment & Plan:   Principal Problem:   Epidural abscess Active Problems:   Benign essential HTN  L3-L4 discitis with osteomyelitis; recurrent Issue Epidural abscess with psoas myositis - Neurosurgery consulted plans for OR today - Pain control - Consulted infectious disease. - Start Abx, post Op  Essential hypertension - NPO.  IV hydralazine as needed  Prediabetes - A1c 5.9 - Insulin sliding scale and Accu-Chek  History of schizophrenia - Resume home meds once verified and able to tolerate p.o.  History of COPD -As needed bronchodilators  History of cocaine use/alcohol use - UDS positive for opiates and cocaine.  Counseled to quit using this   DVT prophylaxis: SCDs Start: 11/03/20 0210  Code Status: Full Family Communication:    Status is: Inpatient  Remains inpatient appropriate because:Inpatient level of care appropriate due to severity of illness  Dispo: The patient is from: Home              Anticipated d/c is to: Home              Patient currently is not medically stable to d/c.   Difficult to place patient No      Subjective: Reports of lower back pain no other complaints.  Denies any illicit drug use in the last few months but his UDS was positive for opiates and cocaine  Review of Systems Otherwise negative except as per HPI, including: General: Denies fever, chills, night sweats or unintended weight loss. Resp: Denies cough, wheezing, shortness of breath. Cardiac: Denies chest pain, palpitations, orthopnea, paroxysmal nocturnal dyspnea. GI: Denies abdominal pain,  nausea, vomiting, diarrhea or constipation GU: Denies dysuria, frequency, hesitancy or incontinence MS: Denies muscle aches, joint pain or swelling Neuro: Denies headache, neurologic deficits (focal weakness, numbness, tingling), abnormal gait Psych: Denies anxiety, depression, SI/HI/AVH Skin: Denies new rashes or lesions ID: Denies sick contacts, exotic exposures, travel  Examination:  General exam: Appears calm and comfortable  Respiratory system: Clear to auscultation. Respiratory effort normal. Cardiovascular system: S1 & S2 heard, RRR. No JVD, murmurs, rubs, gallops or clicks. No pedal edema. Gastrointestinal system: Abdomen is nondistended, soft and nontender. No organomegaly or masses felt. Normal bowel sounds heard. Central nervous system: Alert and oriented. No focal neurological deficits. Extremities: Symmetric 5 x 5 power. Skin: No rashes, lesions or ulcers Psychiatry: Judgement and insight appear normal. Mood & affect appropriate.     Objective: Vitals:   11/03/20 0100 11/03/20 0200 11/03/20 0300 11/03/20 0339  BP: (!) 171/114 (!) 182/111  (!) 161/101  Pulse: 94 (!) 104  (!) 105  Resp: 16 19  20   Temp:   98.9 F (37.2 C) 99.7 F (37.6 C)  TempSrc:   Oral Oral  SpO2: 96% 98%  99%   No intake or output data in the 24 hours ending 11/03/20 0841 There were no vitals filed for this visit.   Data Reviewed:   CBC: Recent Labs  Lab 11/02/20 1612  WBC 21.0*  NEUTROABS 19.3*  HGB 11.9*  HCT 35.6*  MCV 83.4  PLT 326   Basic Metabolic Panel: Recent Labs  Lab 11/02/20 1612  NA 131*  K 3.5  CL 95*  CO2 25  GLUCOSE 133*  BUN 8  CREATININE 0.71  CALCIUM 9.3   GFR: CrCl cannot be calculated (Unknown ideal weight.). Liver Function Tests: Recent Labs  Lab 11/02/20 1612  AST 34  ALT 12  ALKPHOS 103  BILITOT 0.5  PROT 7.7  ALBUMIN 3.8   No results for input(s): LIPASE, AMYLASE in the last 168 hours. No results for input(s): AMMONIA in the last 168  hours. Coagulation Profile: No results for input(s): INR, PROTIME in the last 168 hours. Cardiac Enzymes: No results for input(s): CKTOTAL, CKMB, CKMBINDEX, TROPONINI in the last 168 hours. BNP (last 3 results) No results for input(s): PROBNP in the last 8760 hours. HbA1C: Recent Labs    11/03/20 0633  HGBA1C 5.9*   CBG: Recent Labs  Lab 11/03/20 0414 11/03/20 0751  GLUCAP 126* 129*   Lipid Profile: No results for input(s): CHOL, HDL, LDLCALC, TRIG, CHOLHDL, LDLDIRECT in the last 72 hours. Thyroid Function Tests: No results for input(s): TSH, T4TOTAL, FREET4, T3FREE, THYROIDAB in the last 72 hours. Anemia Panel: No results for input(s): VITAMINB12, FOLATE, FERRITIN, TIBC, IRON, RETICCTPCT in the last 72 hours. Sepsis Labs: Recent Labs  Lab 11/02/20 2000  LATICACIDVEN 1.6    Recent Results (from the past 240 hour(s))  Resp Panel by RT-PCR (Flu A&B, Covid) Urine, Unspecified Source     Status: None   Collection Time: 11/02/20  4:13 PM   Specimen: Urine, Unspecified Source; Nasopharyngeal(NP) swabs in vial transport medium  Result Value Ref Range Status   SARS Coronavirus 2 by RT PCR NEGATIVE NEGATIVE Final    Comment: (NOTE) SARS-CoV-2 target nucleic acids are NOT DETECTED.  The SARS-CoV-2 RNA is generally detectable in upper respiratory specimens during the acute phase of infection. The lowest concentration of SARS-CoV-2 viral copies this assay can detect is 138 copies/mL. A negative result does not preclude SARS-Cov-2 infection and should not be used as the sole basis for treatment or other patient management decisions. A negative result may occur with  improper specimen collection/handling, submission of specimen other than nasopharyngeal swab, presence of viral mutation(s) within the areas targeted by this assay, and inadequate number of viral copies(<138 copies/mL). A negative result must be combined with clinical observations, patient history, and  epidemiological information. The expected result is Negative.  Fact Sheet for Patients:  BloggerCourse.com  Fact Sheet for Healthcare Providers:  SeriousBroker.it  This test is no t yet approved or cleared by the [place] FDA and  has been authorized for detection and/or diagnosis of SARS-CoV-2 by FDA under an Emergency Use Authorization (EUA). This EUA will remain  in effect (meaning this test can be used) for the duration of the COVID-19 declaration under Section 564(b)(1) of the Act, 21 U.S.C.section 360bbb-3(b)(1), unless the authorization is terminated  or revoked sooner.       Influenza A by PCR NEGATIVE NEGATIVE Final   Influenza B by PCR NEGATIVE NEGATIVE Final    Comment: (NOTE) The Xpert Xpress SARS-CoV-2/FLU/RSV plus assay is intended as an aid in the diagnosis of influenza from Nasopharyngeal swab specimens and should not be used as a sole basis for treatment. Nasal washings and aspirates are unacceptable for Xpert Xpress SARS-CoV-2/FLU/RSV testing.  Fact Sheet for Patients: BloggerCourse.com  Fact Sheet for Healthcare Providers: SeriousBroker.it  This test is not yet approved or cleared by the [place] FDA and has been authorized for detection and/or diagnosis of SARS-CoV-2 by FDA under an Emergency Use Authorization (EUA). This EUA will  remain in effect (meaning this test can be used) for the duration of the COVID-19 declaration under Section 564(b)(1) of the Act, 21 U.S.C. section 360bbb-3(b)(1), unless the authorization is terminated or revoked.  Performed at [facility], [address], [place], [place] [address]          Radiology Studies: DG Lumbar Spine Complete  Result Date: 11/02/2020 CLINICAL DATA:  Low back pain EXAM: LUMBAR SPINE - COMPLETE 4+ VIEW COMPARISON:  MR lumbar spine, 02/14/2020 FINDINGS: No fracture or  dislocation of the lumbar spine. Focally severe disc space height loss and ankylosis of L1-L2 status post discitis osteomyelitis. Otherwise mild multilevel disc space height loss and osteophytosis, status post posterior lumbar discectomy and fusion of L4-L5. Moderate multilevel facet degenerative change. Nonobstructive pattern of overlying bowel gas. IMPRESSION: 1.  No fracture or dislocation of the lumbar spine. 2. Focally severe disc space height loss and ankylosis of L1-L2 status post discitis osteomyelitis, as seen on prior MR. 3. Otherwise mild multilevel disc space height loss and osteophytosis, status post posterior lumbar discectomy and fusion of L4-L5. 4. Lumbar disc and neural foraminal pathology may be further evaluated by MRI if indicated by neurologically localizing signs and symptoms. Electronically Signed   By: Lauralyn Primes M.D.   On: 11/02/2020 16:52   MR Lumbar Spine W Wo Contrast  Result Date: 11/02/2020 CLINICAL DATA:  Low back pain with possible infection EXAM: MRI LUMBAR SPINE WITHOUT AND WITH CONTRAST TECHNIQUE: Multiplanar and multiecho pulse sequences of the lumbar spine were obtained without and with intravenous contrast. CONTRAST:  7.74mL GADAVIST GADOBUTROL 1 MMOL/ML IV SOLN COMPARISON:  02/14/2020 FINDINGS: Segmentation:  Standard. Alignment: Unchanged grade 1 retrolisthesis at L2-3 grade 1 anterolisthesis at L4-5. Vertebrae: There is hyperintense T2-weighted signal and abnormal contrast enhancement within the L3 and L4 vertebral bodies. There is edema in the disc space. L4-5 PLIF. Conus medullaris and cauda equina: Conus extends to the L1 level. There is diffuse abnormal contrast enhancement within the epidural space extending the length of the lumbar spine but worst at the L3-L5 levels. Paraspinal and other soft tissues: There is edema and abnormal contrast enhancement in the medial right psoas muscle.No intramuscular abscess. Prevertebral phlegmonous change. Disc levels: L1-2: Severe  disc space narrowing, unchanged. L2-3: Unchanged small disc bulge. Moderate spinal canal stenosis. Circumferential epidural enhancement effaces the thecal sac. L3-4: Unchanged small disc bulge. Circumferential epidural enhancement with severe narrowing of the thecal sac. L4-5: Intermediate sized disc bulge. PLIF. Circumferential epidural contrast enhancement severely narrows the thecal sac with crowding of the nerve roots. Findings at this level have progressed. L5-S1: Normal disc space. No spinal canal stenosis. No epidural enhancement at this level. IMPRESSION: 1. New discitis/osteomyelitis at L3-L4 with epidural abscess/phlegmon extending the length of the lumbar spine but worst at L3-L5. This results in severe narrowing of the thecal sac at both levels with crowding of the nerve roots. 2. Abnormal contrast enhancement in the medial right psoas muscle consistent with myositis. No intramuscular abscess. 3. Prevertebral phlegmonous change. Electronically Signed   By: Deatra Robinson M.D.   On: 11/02/2020 22:04   DG Knee Complete 4 Views Right  Result Date: 11/02/2020 CLINICAL DATA:  Right knee pain EXAM: RIGHT KNEE - COMPLETE 4+ VIEW COMPARISON:  None. FINDINGS: Right total knee arthroplasty has been performed. Normal alignment. No acute fracture or dislocation. There is extensive heterotopic ossification adjacent to the inferior pole of the patella which may result in some degree of impingement on full extension  of the knee, however, this is not well assessed on this examination. There is mild infiltration of Hoffa's fat, nonspecific in the setting of prior arthroplasty. There is thickening and mild infiltration surrounding the distal patellar tendon which may reflect trauma or inflammation involving the patellar tendon. No effusion. Mild prepatellar soft tissue swelling. IMPRESSION: Mild prepatellar soft tissue swelling with more focal thickening and peritendinous infiltration surrounding the distal patellar  tendon which may reflect inflammation or trauma involving this structure. This may be better assessed with MRI examination. Extensive heterotopic ossification surrounding the inferior pole of the patella which may result in impingement of full extension of the right knee. Correlation with clinical examination is recommended. No acute fracture or dislocation. Electronically Signed   By: Helyn Numbers MD   On: 11/02/2020 16:57   DG Hip Unilat With Pelvis 2-3 Views Right  Result Date: 11/02/2020 CLINICAL DATA:  Low back pain radiating down right leg EXAM: DG HIP (WITH OR WITHOUT PELVIS) 2-3V RIGHT COMPARISON:  None. FINDINGS: There is no evidence of hip fracture or dislocation. There is no evidence of arthropathy or other focal bone abnormality. IMPRESSION: No displaced fracture or dislocation of the right hip. Joint spaces are preserved. Electronically Signed   By: Lauralyn Primes M.D.   On: 11/02/2020 16:49        Scheduled Meds:  insulin aspart  0-9 Units Subcutaneous Q4H   lidocaine  1 patch Transdermal Q24H   Continuous Infusions:  sodium chloride 75 mL/hr at 11/03/20 0443     LOS: 0 days   Time spent= 35 mins    Alexxus Sobh Joline Maxcy, MD [facility]  If 7PM-7AM, please contact night-coverage  11/03/2020, 8:41 AM

## 2020-11-03 NOTE — ED Provider Notes (Signed)
Patient care signed out at end of shift by [facility], PA-C.  Diagnosed with Osteo discitis, with abscess, myositis, phlegmon - 3rd episode Neurosurgery consulted and requests consulting orthopedics consult - for input as to treatment Louanne Skye 2021) Per [place] - if ortho defers, he will go to OR in am by him, admitted to hospitalist, transfer to [facility] Discussed with Lurena Joiner [facility]) - is reviewing imaging, will consult with MD and call back  Lurena Joiner returned call and reports ortho defers care to neurosurgery.   A message was sent to Dr. Zada Finders as was the plan discussed with PA Petrucelli, cc'd to Dr. Hal Hope. Patient to be admitted to [facility], transfer to [facility], [facility] to see with plan to take to the OR.      Charlann Lange, PA-C 11/03/20 Irven Coe    Charlesetta Shanks, MD 12/01/20 [phone]

## 2020-11-03 NOTE — Consult Note (Signed)
Neurosurgery Consultation  Reason for Consult: Lumbar epidural abscess Referring Physician: Hal Hope  CC: Low back pain  HPI: This is a 55 y.o. man w/ a h/o prior lumbar surgery (Nitka, L4-5 lami / TLIF) in 2021 with discitis and then recurrent discitis in the lumbar spine last year. He now presents with worsening low back and right lower extremity radicular pain. He has baseline BLE numbness, which is stable, no new weakness, no recent change in bowel or bladder function. No recent use of anti-platelet or anti-coagulant medications. Has a h/o schizophrenia, polysubstance abuse, denies any IVDU, states he thinks that he had a cut on his knee that caused this infection in his spine, but inspection of the knee shows no injury.    ROS: A 14 point ROS was performed and is negative except as noted in the HPI.   PMHx:  Past Medical History:  Diagnosis Date   Anxiety    Arthritis    Asthma    Depression    Duodenal adenoma    GERD (gastroesophageal reflux disease)    Hypertension    Pneumonia    hx   Post-traumatic osteoarthritis of left ankle    Pre-diabetes    Presence of right artificial knee joint 12/17/2016   Schizophrenia ([place])    Stroke ([place])    2009-or10   FamHx:  Family History  Adopted: Yes  Problem Relation Age of Onset   Colon cancer Neg Hx    Esophageal cancer Neg Hx    Rectal cancer Neg Hx    Stomach cancer Neg Hx    SocHx:  reports that he has been smoking cigarettes. He has a 57.00 pack-year smoking history. He has never used smokeless tobacco. He reports previous alcohol use of about 3.0 standard drinks of alcohol per week. He reports current drug use.  Exam: Vital signs in last 24 hours: Temp:  [98.9 F (37.2 C)-99.7 F (37.6 C)] 99.7 F (37.6 C) (08/07 0339) Pulse Rate:  [94-117] 105 (08/07 0339) Resp:  [16-20] 20 (08/07 0339) BP: (136-182)/(89-115) 161/101 (08/07 0339) SpO2:  [95 %-99 %] 99 % (08/07 0339) General: Awake, alert, cooperative, lying in  bed, appears uncomfortable Head: Normocephalic and atruamatic HEENT: Neck supple Pulmonary: breathing room air comfortably, no evidence of increased work of breathing Cardiac: RRR Abdomen: S NT ND Extremities: Warm and well perfused x4 Neuro:  Strength 5/5 in BUE and pain limited in the BLE 4+/5, SILTx4 except stocking and fingertip numbness bilaterally that is symmetric  Assessment and Plan: 55 y.o. man w/ recurrent spine infections and worsening low back and RLE pain. MRI lumbar spine personally reviewed, which shows interval destruction of the L3-4 disc c/w discitis, epidural enhancement c/w phlegmon vs abscess (difficult to say w/ hardware artifact) and severe canal stenosis. Prior CT from last year shows pseudoarthrosis at L4-5.   -OR today for decompression and evacuation of abscess, will obtain cultures as well -please call with any concerns or questions  Judith Part, MD 11/03/20 8:47 AM [facility]

## 2020-11-03 NOTE — Progress Notes (Signed)
Neurosurgery Service Post-operative progress note  Assessment & Plan: 55 y.o. man s/p evacuation of lumbar epidural abscess. Intra-op findings w/ frank pus that was evacuated and irrigated, sent for cultures prior to antibiotics being started. Post-op, pt can have diet and activity as tolerated, no restrictions, no brace needed, remainder of care per primary team. Please call with any questions or concerns.  Judith Part  11/03/20 4:49 PM

## 2020-11-03 NOTE — Anesthesia Postprocedure Evaluation (Signed)
Anesthesia Post Note  Patient: [name]  Procedure(s) Performed: LUMBAR THREE AND LUMBAR  FOUR LAMINECTOMY FOR EVACUATION EPIDURAL ABSCESS (Back)     Patient location during evaluation: PACU Anesthesia Type: General Level of consciousness: awake and alert Pain management: pain level controlled Vital Signs Assessment: post-procedure vital signs reviewed and stable Respiratory status: spontaneous breathing, nonlabored ventilation and respiratory function stable Cardiovascular status: blood pressure returned to baseline and stable Postop Assessment: no apparent nausea or vomiting Anesthetic complications: no   No notable events documented.  Last Vitals:  Vitals:   11/03/20 1800 11/03/20 1815  BP: (!) 157/97 (!) 163/94  Pulse: 78 78  Resp: 15 15  Temp:    SpO2: 97% 97%    Last Pain:  Vitals:   11/03/20 1745  TempSrc:   PainSc: 0-No pain                 Demetris Capell,W. EDMOND

## 2020-11-03 NOTE — Transfer of Care (Signed)
Immediate Anesthesia Transfer of Care Note  Patient: [name]  Procedure(s) Performed: LUMBAR THREE AND LUMBAR  FOUR LAMINECTOMY FOR EVACUATION EPIDURAL ABSCESS (Back)  Patient Location: PACU  Anesthesia Type:General  Level of Consciousness: awake, alert  and oriented  Airway & Oxygen Therapy: Patient Spontanous Breathing  Post-op Assessment: Report given to RN and Post -op Vital signs reviewed and stable  Post vital signs: Reviewed and stable  Last Vitals:  Vitals Value Taken Time  BP 137/88 11/03/20 1700  Temp    Pulse 82 11/03/20 1705  Resp 13 11/03/20 1705  SpO2 98 % 11/03/20 1705  Vitals shown include unvalidated device data.  Last Pain:  Vitals:   11/03/20 1321  TempSrc:   PainSc: 10-Worst pain ever         Complications: No notable events documented.

## 2020-11-03 NOTE — Progress Notes (Signed)
Patient admitted from PACU s/p posterior lumbar laminectomy for evacuation of epidural abscess.  Pt is A&O and states pain is well controlled.  Back incision CD&I with no observable drainage.  Moves well in bed.  Introduced to staff and to unit routine.  Supper ordered.  Bed in low position and bed alarm  / brake on for safety.

## 2020-11-03 NOTE — ED Notes (Addendum)
Pt arrived from [facility], tx to [facility] and Dr. Joaquim Nam. OR time tomorrow tentatively 1145. Pt is to remain NPO. Pt A & O, moving all extremities. Dr. Raliegh Ip notified pt has arrived, NS to message Dr. Joaquim Nam that patient is now at [facility].

## 2020-11-03 NOTE — H&P (Signed)
History and Physical    [name] HQI:[id number] DOB: 09-12-1965 DOA: 11/02/2020  PCP: [name], MD  Patient coming from: Home.  Chief Complaint: Back pain.  HPI: [name] is a 55 y.o. male with history of recurrent discitis admitted in October 2021 for lumbar discitis with phlegmon was on prolonged antibiotic course presents to the ER with complaints of low back pain for the last 2 days.  Denies any incontinence of urine or bowel or weakness of the lower extremities but does have significant pain on trying to ambulate.  Denies fever chills per denies any trauma.  ED Course: In the ER patient was having a temperature of 99 point degrees Fahrenheit.  Labs show WBC count of 21,000 lactic acid was normal.  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  Neurosurgeon Dr. Johnsie Cancel was consulted and patient will be transferred to [facility] for further recommendations per neurosurgery and possible surgery.  Blood cultures obtained.  COVID test is negative.  Review of Systems: As per HPI, rest all negative.   Past Medical History:  Diagnosis Date   Anxiety    Arthritis    Asthma    Depression    Duodenal adenoma    GERD (gastroesophageal reflux disease)    Hypertension    Pneumonia    hx   Post-traumatic osteoarthritis of left ankle    Pre-diabetes    Presence of right artificial knee joint 12/17/2016   Schizophrenia (HCC)    Stroke (HCC)    2009-or10    Past Surgical History:  Procedure Laterality Date   ANKLE ARTHROSCOPY Left 10/20/2017   Procedure: ANKLE ARTHROSCOPY;  Surgeon: Nadara Mustard, MD;  Location[facility] OR;  Service: Orthopedics;  Laterality: Left;   ANKLE FUSION Left 10/20/2017   ANKLE FUSION Left 10/20/2017   Procedure: LEFT ANKLE FUSION;  Surgeon: Nadara Mustard, MD;  Location: [facility] OR;  Service: Orthopedics;  Laterality: Left;    ESOPHAGOGASTRODUODENOSCOPY Left 12/05/2013   Procedure: ESOPHAGOGASTRODUODENOSCOPY (EGD);  Surgeon: Willis Modena, MD;  Location: [facility]COPY;  Service: Endoscopy;  Laterality: Left;   ESOPHAGOGASTRODUODENOSCOPY (EGD) WITH PROPOFOL N/A 02/20/2015   Procedure: ESOPHAGOGASTRODUODENOSCOPY (EGD) WITH PROPOFOL;  Surgeon: Charlott Rakes, MD;  Location: [facility] ENDOSCOPY;  Service: Endoscopy;  Laterality: N/A;   HARDWARE REMOVAL Left 01/29/2018   Procedure: HARDWARE REMOVAL LEFT ANKLE;  Surgeon: Nadara Mustard, MD;  Location: [facility] OR;  Service: Orthopedics;  Laterality: Left;   IR LUMBAR DISC ASPIRATION W/IMG GUIDE  01/08/2020   JOINT REPLACEMENT     TOTAL KNEE ARTHROPLASTY Right 07/21/2016   Procedure: TOTAL KNEE ARTHROPLASTY;  Surgeon: Cammy Copa, MD;  Location: [facility] OR;  Service: Orthopedics;  Laterality: Right;   UPPER GASTROINTESTINAL ENDOSCOPY       reports that he has been smoking cigarettes. He has a 57.00 pack-year smoking history. He has never used smokeless tobacco. He reports previous alcohol use of about 3.0 standard drinks of alcohol per week. He reports current drug use.  Allergies  Allergen Reactions   Chlorhexidine Itching and Rash    Family History  Adopted: Yes  Problem Relation Age of Onset   Colon cancer Neg Hx    Esophageal cancer Neg Hx    Rectal cancer Neg Hx    Stomach cancer Neg Hx     Prior to Admission medications   Medication Sig Start Date End Date Taking? Authorizing Provider  albuterol (PROVENTIL HFA;VENTOLIN HFA) 108 (90 Base) MCG/ACT inhaler Inhale 1 puff into the lungs 2 (two) times daily as needed for wheezing or shortness of breath. Patient taking differently: Inhale 2 puffs into the lungs 2 (two) times daily as needed for wheezing or shortness of breath. 05/14/17  Yes Diallo, Abdoulaye, MD  benztropine (COGENTIN) 1 MG tablet Take 1 mg by mouth daily.   Yes [provider]  buPROPion (WELLBUTRIN XL) 300 MG 24 hr tablet Take 300 mg by mouth daily.    Yes [provider]  ferrous sulfate 325 (65 FE) MG tablet Take 1 tablet (325 mg total) by mouth daily with breakfast. 02/21/20 11/02/20 Yes Gertha Calkin, MD  fluticasone (FLONASE) 50 MCG/ACT nasal spray Place 1-2 sprays into both nostrils daily.   Yes [provider]  gabapentin (NEURONTIN) 300 MG capsule Take 1 capsule (300 mg total) by mouth 2 (two) times daily. Patient taking differently: Take 300 mg by mouth 2 (two) times daily as needed (for nerve pain). 10/23/19  Yes Kerrin Champagne, MD  INVEGA TRINZA 819 MG/2.63ML SUSY injection Inject 819 mg into the muscle every 3 (three) months.   Yes [provider]  metoprolol succinate (TOPROL-XL) 50 MG 24 hr tablet Take 50 mg by mouth daily. Take with or immediately following a meal.   Yes [provider]  cetirizine (ZYRTEC) 10 MG tablet TAKE 1 TABLET BY MOUTH ONCE DAILY. Patient taking differently: Take 10 mg by mouth daily. No Therapeutic Substitution 03/28/18   Diallo, Abdoulaye, MD  Melatonin 5 MG TABS Take 10 mg by mouth at bedtime. 02/28/19   [provider]  mirtazapine (REMERON) 15 MG tablet Take 15 mg by mouth at bedtime.    [provider]  Multiple Vitamins-Minerals (VITRUM SENIOR) TABS Take 1 tablet by mouth daily.  04/30/15   [provider]  nicotine (NICODERM CQ - DOSED IN MG/24 HOURS) 14 mg/24hr patch Place 1 patch (14 mg total) onto the skin daily. 02/20/20   Gertha Calkin, MD  omeprazole (PRILOSEC) 40 MG capsule TAKE 1 CAPSULE(40 MG) BY MOUTH DAILY Patient taking differently: Take 40 mg by mouth daily. 06/03/20   Kerrin Champagne, MD  oxyCODONE ER [facility] ER) 9 MG C12A Take 9 mg by mouth in the morning and at bedtime. 09/05/19   Glade Lloyd, MD  pantoprazole (PROTONIX) 40 MG tablet Take 1 tablet (40 mg total) by mouth daily. 02/20/20 03/21/20  Gertha Calkin, MD  thiamine 100 MG tablet Take 1 tablet (100 mg total) by mouth daily. 09/05/19   Glade Lloyd, MD    Physical  Exam: Constitutional: Moderately built and nourished. Vitals:   11/02/20 2245 11/03/20 0022 11/03/20 0100 11/03/20 0200  BP: (!) 162/95 (!) 173/115 (!) 171/114 (!) 182/111  Pulse: (!) 103 100 94 (!) 104  Resp: 18 20 16 19   Temp:      TempSrc:      SpO2: 95% 98% 96% 98%   Eyes: Anicteric no pallor. ENMT: No discharge from the ears eyes nose and mouth. Neck: No mass felt.  No neck rigidity. Respiratory: No rhonchi or crepitations. Cardiovascular: S1-S2 heard. Abdomen: Soft nontender bowel sound present. Musculoskeletal: Pain on moving lower extremities.  Pain is mostly low back. Skin: No rash. Neurologic: Alert awake oriented to time place and person.  Moving all extremities.  Lower extremity movements are restricted by pain. Psychiatric: Appears normal.  Normal affect.   Labs on Admission: I have personally reviewed following labs  and imaging studies  CBC: Recent Labs  Lab 11/02/20 1612  WBC 21.0*  NEUTROABS 19.3*  HGB 11.9*  HCT 35.6*  MCV 83.4  PLT 326   Basic Metabolic Panel: Recent Labs  Lab 11/02/20 1612  NA 131*  K 3.5  CL 95*  CO2 25  GLUCOSE 133*  BUN 8  CREATININE 0.71  CALCIUM 9.3   GFR: CrCl cannot be calculated (Unknown ideal weight.). Liver Function Tests: Recent Labs  Lab 11/02/20 1612  AST 34  ALT 12  ALKPHOS 103  BILITOT 0.5  PROT 7.7  ALBUMIN 3.8   No results for input(s): LIPASE, AMYLASE in the last 168 hours. No results for input(s): AMMONIA in the last 168 hours. Coagulation Profile: No results for input(s): INR, PROTIME in the last 168 hours. Cardiac Enzymes: No results for input(s): CKTOTAL, CKMB, CKMBINDEX, TROPONINI in the last 168 hours. BNP (last 3 results) No results for input(s): PROBNP in the last 8760 hours. HbA1C: No results for input(s): HGBA1C in the last 72 hours. CBG: No results for input(s): GLUCAP in the last 168 hours. Lipid Profile: No results for input(s): CHOL, HDL, LDLCALC, TRIG, CHOLHDL, LDLDIRECT in  the last 72 hours. Thyroid Function Tests: No results for input(s): TSH, T4TOTAL, FREET4, T3FREE, THYROIDAB in the last 72 hours. Anemia Panel: No results for input(s): VITAMINB12, FOLATE, FERRITIN, TIBC, IRON, RETICCTPCT in the last 72 hours. Urine analysis:    Component Value Date/Time   COLORURINE STRAW (A) 11/02/2020 1613   APPEARANCEUR CLEAR 11/02/2020 1613   LABSPEC 1.003 (L) 11/02/2020 1613   PHURINE 8.0 11/02/2020 1613   GLUCOSEU NEGATIVE 11/02/2020 1613   HGBUR NEGATIVE 11/02/2020 1613   BILIRUBINUR NEGATIVE 11/02/2020 1613   BILIRUBINUR negative 04/10/2016 1702   KETONESUR NEGATIVE 11/02/2020 1613   PROTEINUR NEGATIVE 11/02/2020 1613   UROBILINOGEN 0.2 04/10/2016 1702   UROBILINOGEN 0.2 12/04/2013 1044   NITRITE NEGATIVE 11/02/2020 1613   LEUKOCYTESUR NEGATIVE 11/02/2020 1613   Sepsis Labs: @LABRCNTIP (procalcitonin:4,lacticidven:4) ) Recent Results (from the past 240 hour(s))  Resp Panel by RT-PCR (Flu A&B, Covid) Urine, Unspecified Source     Status: None   Collection Time: 11/02/20  4:13 PM   Specimen: Urine, Unspecified Source; Nasopharyngeal(NP) swabs in vial transport medium  Result Value Ref Range Status   SARS Coronavirus 2 by RT PCR NEGATIVE NEGATIVE Final    Comment: (NOTE) SARS-CoV-2 target nucleic acids are NOT DETECTED.  The SARS-CoV-2 RNA is generally detectable in upper respiratory specimens during the acute phase of infection. The lowest concentration of SARS-CoV-2 viral copies this assay can detect is 138 copies/mL. A negative result does not preclude SARS-Cov-2 infection and should not be used as the sole basis for treatment or other patient management decisions. A negative result may occur with  improper specimen collection/handling, submission of specimen other than nasopharyngeal swab, presence of viral mutation(s) within the areas targeted by this assay, and inadequate number of viral copies(<138 copies/mL). A negative result must be combined  with clinical observations, patient history, and epidemiological information. The expected result is Negative.  Fact Sheet for Patients:  BloggerCourse.com  Fact Sheet for Healthcare Providers:  SeriousBroker.it  This test is no t yet approved or cleared by the [place] FDA and  has been authorized for detection and/or diagnosis of SARS-CoV-2 by FDA under an Emergency Use Authorization (EUA). This EUA will remain  in effect (meaning this test can be used) for the duration of the COVID-19 declaration under Section 564(b)(1) of the Act, 21 U.S.C.section  360bbb-3(b)(1), unless the authorization is terminated  or revoked sooner.       Influenza A by PCR NEGATIVE NEGATIVE Final   Influenza B by PCR NEGATIVE NEGATIVE Final    Comment: (NOTE) The Xpert Xpress SARS-CoV-2/FLU/RSV plus assay is intended as an aid in the diagnosis of influenza from Nasopharyngeal swab specimens and should not be used as a sole basis for treatment. Nasal washings and aspirates are unacceptable for Xpert Xpress SARS-CoV-2/FLU/RSV testing.  Fact Sheet for Patients: BloggerCourse.com  Fact Sheet for Healthcare Providers: SeriousBroker.it  This test is not yet approved or cleared by the [place] FDA and has been authorized for detection and/or diagnosis of SARS-CoV-2 by FDA under an Emergency Use Authorization (EUA). This EUA will remain in effect (meaning this test can be used) for the duration of the COVID-19 declaration under Section 564(b)(1) of the Act, 21 U.S.C. section 360bbb-3(b)(1), unless the authorization is terminated or revoked.  Performed at [facility], [address], [place], [place] [address]      Radiological Exams on Admission: DG Lumbar Spine Complete  Result Date: 11/02/2020 CLINICAL DATA:  Low back pain EXAM: LUMBAR SPINE - COMPLETE 4+ VIEW  COMPARISON:  MR lumbar spine, 02/14/2020 FINDINGS: No fracture or dislocation of the lumbar spine. Focally severe disc space height loss and ankylosis of L1-L2 status post discitis osteomyelitis. Otherwise mild multilevel disc space height loss and osteophytosis, status post posterior lumbar discectomy and fusion of L4-L5. Moderate multilevel facet degenerative change. Nonobstructive pattern of overlying bowel gas. IMPRESSION: 1.  No fracture or dislocation of the lumbar spine. 2. Focally severe disc space height loss and ankylosis of L1-L2 status post discitis osteomyelitis, as seen on prior MR. 3. Otherwise mild multilevel disc space height loss and osteophytosis, status post posterior lumbar discectomy and fusion of L4-L5. 4. Lumbar disc and neural foraminal pathology may be further evaluated by MRI if indicated by neurologically localizing signs and symptoms. Electronically Signed   By: Lauralyn Primes M.D.   On: 11/02/2020 16:52   MR Lumbar Spine W Wo Contrast  Result Date: 11/02/2020 CLINICAL DATA:  Low back pain with possible infection EXAM: MRI LUMBAR SPINE WITHOUT AND WITH CONTRAST TECHNIQUE: Multiplanar and multiecho pulse sequences of the lumbar spine were obtained without and with intravenous contrast. CONTRAST:  7.28mL GADAVIST GADOBUTROL 1 MMOL/ML IV SOLN COMPARISON:  02/14/2020 FINDINGS: Segmentation:  Standard. Alignment: Unchanged grade 1 retrolisthesis at L2-3 grade 1 anterolisthesis at L4-5. Vertebrae: There is hyperintense T2-weighted signal and abnormal contrast enhancement within the L3 and L4 vertebral bodies. There is edema in the disc space. L4-5 PLIF. Conus medullaris and cauda equina: Conus extends to the L1 level. There is diffuse abnormal contrast enhancement within the epidural space extending the length of the lumbar spine but worst at the L3-L5 levels. Paraspinal and other soft tissues: There is edema and abnormal contrast enhancement in the medial right psoas muscle.No intramuscular  abscess. Prevertebral phlegmonous change. Disc levels: L1-2: Severe disc space narrowing, unchanged. L2-3: Unchanged small disc bulge. Moderate spinal canal stenosis. Circumferential epidural enhancement effaces the thecal sac. L3-4: Unchanged small disc bulge. Circumferential epidural enhancement with severe narrowing of the thecal sac. L4-5: Intermediate sized disc bulge. PLIF. Circumferential epidural contrast enhancement severely narrows the thecal sac with crowding of the nerve roots. Findings at this level have progressed. L5-S1: Normal disc space. No spinal canal stenosis. No epidural enhancement at this level. IMPRESSION: 1. New discitis/osteomyelitis at L3-L4 with epidural abscess/phlegmon extending the length of the  lumbar spine but worst at L3-L5. This results in severe narrowing of the thecal sac at both levels with crowding of the nerve roots. 2. Abnormal contrast enhancement in the medial right psoas muscle consistent with myositis. No intramuscular abscess. 3. Prevertebral phlegmonous change. Electronically Signed   By: Deatra Robinson M.D.   On: 11/02/2020 22:04   DG Knee Complete 4 Views Right  Result Date: 11/02/2020 CLINICAL DATA:  Right knee pain EXAM: RIGHT KNEE - COMPLETE 4+ VIEW COMPARISON:  None. FINDINGS: Right total knee arthroplasty has been performed. Normal alignment. No acute fracture or dislocation. There is extensive heterotopic ossification adjacent to the inferior pole of the patella which may result in some degree of impingement on full extension of the knee, however, this is not well assessed on this examination. There is mild infiltration of Hoffa's fat, nonspecific in the setting of prior arthroplasty. There is thickening and mild infiltration surrounding the distal patellar tendon which may reflect trauma or inflammation involving the patellar tendon. No effusion. Mild prepatellar soft tissue swelling. IMPRESSION: Mild prepatellar soft tissue swelling with more focal  thickening and peritendinous infiltration surrounding the distal patellar tendon which may reflect inflammation or trauma involving this structure. This may be better assessed with MRI examination. Extensive heterotopic ossification surrounding the inferior pole of the patella which may result in impingement of full extension of the right knee. Correlation with clinical examination is recommended. No acute fracture or dislocation. Electronically Signed   By: Helyn Numbers MD   On: 11/02/2020 16:57   DG Hip Unilat With Pelvis 2-3 Views Right  Result Date: 11/02/2020 CLINICAL DATA:  Low back pain radiating down right leg EXAM: DG HIP (WITH OR WITHOUT PELVIS) 2-3V RIGHT COMPARISON:  None. FINDINGS: There is no evidence of hip fracture or dislocation. There is no evidence of arthropathy or other focal bone abnormality. IMPRESSION: No displaced fracture or dislocation of the right hip. Joint spaces are preserved. Electronically Signed   By: Lauralyn Primes M.D.   On: 11/02/2020 16:49     Assessment/Plan Principal Problem:   Epidural abscess Active Problems:   Benign essential HTN    L3-L4 discitis/osteomyelitis with epidural abscess for which neurosurgeon Dr. Johnsie Cancel has been consulted.  Blood cultures have been obtained presently not started on antibiotics as discussed with neurosurgeon.  We will keep patient n.p.o. in anticipation of procedure.  Pain relief medications. Hypertension uncontrolled we will keep patient on as needed IV hydralazine since patient is n.p.o. History of possible diabetes mellitus last hemoglobin A1c in April 2021 was 6.6.  We will recheck hemoglobin A1c presently keep patient on sliding scale coverage. Anemia follow CBC.  Check anemia panel with next blood draw. History of schizophrenia per chart.  Home medication needs to be verified.  Since patient has epidural abscess with discitis will need close monitoring for any further worsening and inpatient status.   DVT  prophylaxis: SCDs. Code Status: Full code. Family Communication: Discussed with patient. Disposition Plan: To be determined. Consults called: Neurosurgery. Admission status: Inpatient   Eduard Clos MD [facility] Pager [phone].  If 7PM-7AM, please contact night-coverage [email or web address] Password [email or web address]  11/03/2020, 2:11 AM

## 2020-11-04 ENCOUNTER — Encounter (HOSPITAL_COMMUNITY): Payer: Self-pay | Admitting: Neurological Surgery

## 2020-11-04 DIAGNOSIS — G062 Extradural and subdural abscess, unspecified: Secondary | ICD-10-CM | POA: Diagnosis not present

## 2020-11-04 LAB — CBC
HCT: 33.2 % — ABNORMAL LOW (ref 39.0–52.0)
Hemoglobin: 11.2 g/dL — ABNORMAL LOW (ref 13.0–17.0)
MCH: 27.7 pg (ref 26.0–34.0)
MCHC: 33.7 g/dL (ref 30.0–36.0)
MCV: 82.2 fL (ref 80.0–100.0)
Platelets: 347 10*3/uL (ref 150–400)
RBC: 4.04 MIL/uL — ABNORMAL LOW (ref 4.22–5.81)
RDW: 14.6 % (ref 11.5–15.5)
WBC: 16.7 10*3/uL — ABNORMAL HIGH (ref 4.0–10.5)
nRBC: 0 % (ref 0.0–0.2)

## 2020-11-04 LAB — BASIC METABOLIC PANEL
Anion gap: 11 (ref 5–15)
BUN: 13 mg/dL (ref 6–20)
CO2: 22 mmol/L (ref 22–32)
Calcium: 8.9 mg/dL (ref 8.9–10.3)
Chloride: 98 mmol/L (ref 98–111)
Creatinine, Ser: 0.84 mg/dL (ref 0.61–1.24)
GFR, Estimated: >60 mL/min (ref >60)
Glucose, Bld: 127 mg/dL — ABNORMAL HIGH (ref 70–99)
Potassium: 3.4 mmol/L — ABNORMAL LOW (ref 3.5–5.1)
Sodium: 131 mmol/L — ABNORMAL LOW (ref 135–145)

## 2020-11-04 LAB — GLUCOSE, CAPILLARY
Glucose-Capillary: 141 mg/dL — ABNORMAL HIGH (ref 70–99)
Glucose-Capillary: 150 mg/dL — ABNORMAL HIGH (ref 70–99)
Glucose-Capillary: 140 mg/dL — ABNORMAL HIGH (ref 70–99)
Glucose-Capillary: 121 mg/dL — ABNORMAL HIGH (ref 70–99)
Glucose-Capillary: 150 mg/dL — ABNORMAL HIGH (ref 70–99)
Glucose-Capillary: 163 mg/dL — ABNORMAL HIGH (ref 70–99)

## 2020-11-04 LAB — MAGNESIUM: Magnesium: 2 mg/dL (ref 1.7–2.4)

## 2020-11-04 MED ORDER — INSULIN ASPART 100 UNIT/ML IJ SOLN
0.0000 [IU] | Freq: Three times a day (TID) | INTRAMUSCULAR | Status: DC
  Administered 2020-11-04: 1 [IU] via SUBCUTANEOUS
  Administered 2020-11-05: 2 [IU] via SUBCUTANEOUS
  Administered 2020-11-05 – 2020-11-06 (×4): 1 [IU] via SUBCUTANEOUS
  Administered 2020-11-06: 2 [IU] via SUBCUTANEOUS
  Administered 2020-11-07 – 2020-11-11 (×8): 1 [IU] via SUBCUTANEOUS
  Administered 2020-11-11: 2 [IU] via SUBCUTANEOUS
  Administered 2020-11-12 (×2): 1 [IU] via SUBCUTANEOUS
  Administered 2020-11-13 (×2): 2 [IU] via SUBCUTANEOUS
  Administered 2020-11-13: 3 [IU] via SUBCUTANEOUS
  Administered 2020-11-14: 2 [IU] via SUBCUTANEOUS
  Administered 2020-11-14 – 2020-11-16 (×3): 1 [IU] via SUBCUTANEOUS

## 2020-11-04 MED ORDER — SUCRALFATE 1 GM/10ML PO SUSP
1.0000 g | Freq: Three times a day (TID) | ORAL | Status: DC
  Administered 2020-11-04 – 2020-11-18 (×50): 1 g via ORAL
  Filled 2020-11-04 (×58): qty 10

## 2020-11-04 MED ORDER — ALUM & MAG HYDROXIDE-SIMETH 200-200-20 MG/5ML PO SUSP
30.0000 mL | Freq: Four times a day (QID) | ORAL | Status: DC | PRN
  Administered 2020-11-04 (×2): 30 mL via ORAL
  Filled 2020-11-04 (×2): qty 30

## 2020-11-04 MED ORDER — VANCOMYCIN HCL 750 MG/150ML IV SOLN
750.0000 mg | Freq: Three times a day (TID) | INTRAVENOUS | Status: DC
  Administered 2020-11-04 – 2020-11-06 (×5): 750 mg via INTRAVENOUS
  Filled 2020-11-04 (×6): qty 150

## 2020-11-04 MED ORDER — SODIUM CHLORIDE 0.9 % IV SOLN
2.0000 g | Freq: Three times a day (TID) | INTRAVENOUS | Status: DC
  Administered 2020-11-04 – 2020-11-18 (×41): 2 g via INTRAVENOUS
  Filled 2020-11-04 (×41): qty 2

## 2020-11-04 MED ORDER — VANCOMYCIN HCL 2000 MG/400ML IV SOLN
2000.0000 mg | Freq: Once | INTRAVENOUS | Status: AC
  Administered 2020-11-04: 2000 mg via INTRAVENOUS
  Filled 2020-11-04: qty 400

## 2020-11-04 MED ORDER — HYDROMORPHONE HCL 1 MG/ML IJ SOLN
1.0000 mg | Freq: Four times a day (QID) | INTRAMUSCULAR | Status: DC | PRN
  Administered 2020-11-04 – 2020-11-05 (×3): 1 mg via INTRAVENOUS
  Filled 2020-11-04 (×3): qty 1

## 2020-11-04 MED ORDER — ALUM & MAG HYDROXIDE-SIMETH 200-200-20 MG/5ML PO SUSP
30.0000 mL | Freq: Once | ORAL | Status: AC
  Administered 2020-11-04: 30 mL via ORAL
  Filled 2020-11-04: qty 30

## 2020-11-04 MED ORDER — OXYCODONE HCL 5 MG PO TABS
10.0000 mg | ORAL_TABLET | ORAL | Status: DC | PRN
  Administered 2020-11-04 – 2020-11-06 (×12): 10 mg via ORAL
  Filled 2020-11-04 (×14): qty 2

## 2020-11-04 MED ORDER — POTASSIUM CHLORIDE CRYS ER 20 MEQ PO TBCR
40.0000 meq | EXTENDED_RELEASE_TABLET | Freq: Once | ORAL | Status: AC
  Administered 2020-11-04: 40 meq via ORAL
  Filled 2020-11-04: qty 2

## 2020-11-04 NOTE — Progress Notes (Signed)
Pharmacy Antibiotic Note  [name] is a 54 y.o. male admitted on 11/02/2020 with of lumbar epidural abscess s/p evacuation of frank pus 8/7. Pharmacy has been consulted for vancomycin and cefepime dosing.  WBC 16. Afebrile. ClCr 99 ml/min. Target vanc trough 15- 20.   Plan: Vancomycin 2g x1 then '750mg'$  Q8 hr  F/u vanc trough 8/10 at 0100 Cefepime 2g Q 8 hr Monitor cultures, clinical status, renal fx   Narrow abx as able and f/u duration    Height: '5\' 9"'$  (175.3 cm) Weight: 78.4 kg (172 lb 13.5 oz) IBW/kg (Calculated) : 70.7  Temp (24hrs), Avg:98.5 F (36.9 C), Min:97.6 F (36.4 C), Max:99.5 F (37.5 C)  Recent Labs  Lab 11/02/20 1612 11/02/20 2000 11/04/20 0228  WBC 21.0*  --  16.7*  CREATININE 0.71  --  0.84  LATICACIDVEN  --  1.6  --     Estimated Creatinine Clearance: 99.4 mL/min (by C-G formula based on SCr of 0.84 mg/dL).    Allergies  Allergen Reactions   Chlorhexidine Itching and Rash    Antimicrobials this admission: Cefe 8/7 Vanc  8/8 >>  Cefe 8/8 >>   Dose adjustments this admission: 8/10 VT: pend  Microbiology results: 8/ BCx: pend 8/7 OR abscess: pend 8/7 MRSA PCR: neg  Thank you for allowing pharmacy to be a part of this patient's care.  Benetta Spar, PharmD, BCPS, BCCP Clinical Pharmacist  Please check AMION for all [place] phone numbers After 10:00 PM, call [place] [phone]

## 2020-11-04 NOTE — Consult Note (Addendum)
I have seen and examined the patient. I have personally reviewed the clinical findings, laboratory findings, microbiological data and imaging studies. The assessment and treatment plan was discussed with the  Advance Practice Provider, [name],  I agree with her/his recommendations except following additions/corrections.  55 year old male with PMH of polysubstance abuse, RT TKA , prior h/o left ankle infection a/w hardware ( removed), history of TLIF L4-5 with hardware followed by  L1-L2 discitis and osteomyelitis s/p  IV abtx for 6 weeks ( May-June 2021) followed by recurrent/advancing T-L discitis and osteomyelitis and epidural phlegmon s/p IV Vancomycin+ cefepime>> oritavancin  and ciprofloxacin ( October-December 2021) with repeat MRI 11/18 with no epidural abscess and improvement in the discitis/osteomyelitis with normal inflammatory markers admitted with worsening back pain with MRI findings discitis/osteomyelitis at L3-L4 with epidural abscess/phlegmon extending the length of the lumbar spine/right psoas myositis/prevertebral phlegmonous change.  S/p L3-L4 laminectomies and evacuation of epidural abscess by neurosurgery on 8/7.  Operative cultures gram stain with no organisms cultures pending  Started on vancomycin and cefepime yesterday. H/p prior Enterobacter cloacae infection in left ankle in 2019  Will need 6 to 8 weeks of IV antibiotics followed by p.o. suppression indefinitely  Complains of pain in the right knee, had a fall 3 months ago, no signs of septic joint on exam.  Has a history of right TKA in 2018  Monitor CBC BMP vancomycin trough Baseline ESR and CRP HCVRNA and hepatitis serologies as below Following cultures   [name], [place] [facility] [place] [facility]    Date of Admission:  11/02/2020     Total days of antibiotics 1               Reason for Consult: Lumbar epidural  abscess, HW involvement    Referring Provider: Reesa [name]  Primary Care Provider: Gladys Damme, MD   Assessment: [name] is a 55 y.o. male with recurrent acute lumbar discitis/osteomyelitis now involving L4 hardware.    Discitis / Osteomyelitis / Epidural Abscess D5-H2 complicated by hardware - now s/p epidural evacuation by [name]. Op note reviewed - with significant purulent material tracking along and around L4 screw. Gram stain negative with cultures pending at this time. No blood cultures drawn current admission >> likely would have been low yield given no systemic signs of illness. Will start vancomycin and cefepime IV and follow micro data.  Given his unstable housing and likely recent IV drug use < 3 months ago would recommend supervised therapy. Given hardware involvement will need to continue protracted oral antibiotics. Hopeful we will grow a pathogen from operative cultures.   History of hepatitis C, spontaneously cured w/o DAAs. Given concern for relapse in injection drug use per chart history in May 2022 will recheck RNA level. LFTs normal. HIV non reactive. Hep B sAg and Ab will be checked as well for health maintenance.   Rt knee arthroplasty 2018 - states he fell a few months ago and has been tender since then with intermittent chills. No reported stiffness or difficulty walking related to knee. Exam is under whelming for infection with well healed incision, absent edema, fluctuance, warmth or effusion. He does endorse some TTP with anterior patella/ femur.   Polysubstance abuse, Heroin Injection - hepatitis screen as above. He does not admit nor deny today for me on exam but falling asleep frequently.   Schizophrenia, [name] reported he has  ACTT team, not on any psychotropic medications per recent [facility] admission notes.    Plan: Vancomycin + Cefepime Follow micro  Hep B sAg / sAb in AM Hep C rna in AM ESR / CRP in AM      Principal Problem:   Epidural  abscess Active Problems:   Benign essential HTN    benztropine  1 mg Oral Daily   buPROPion  300 mg Oral Daily   ferrous sulfate  325 mg Oral Q breakfast   insulin aspart  0-9 Units Subcutaneous Q4H   lidocaine  1 patch Transdermal Q24H   melatonin  10 mg Oral QHS   metoprolol succinate  50 mg Oral Daily   mirtazapine  15 mg Oral QHS   nicotine  14 mg Transdermal Daily   pantoprazole  40 mg Oral Daily    HPI: [name] is a 55 y.o. male admitted on 8/07 from home with complaints of worsening, severe back pain. He has h/o prior lumbar surbery L4-L5 laminectomy / TLIF in 2021.   PMHx detailed below; significant for previous episodes of discitis/osteomyelitis at L1-L2 (first May - June 2021) and most recently treated October 10th - February 20, 2020 with nearly 8 weeks of abx (6 weeks IV vancomycin + cefepime >> followed by 1 dose oritavancin + 2 weeks Cipro course). Repeat MRI 02/15/20 revealed resolution of epidural abscess with normalization of inflammatory markers. FU with [name] on 12/09 revealed ongoing pain in his back. Referred to pain management and back to ortho for ongoing management of low back pain and hip pain. MRI in January 2022 revealed healed OM/Discitis at L1-L2. Noted severe stenosis at L3-L4.   He has also had L foot infection in the past now cured s/p hardware removal and antibiotics (2019).  Rt knee replaced [name]) in 2018 w/o complaints currently.   In the ER he states his back pain had acutely worsened over the 48 hours prior to seeking care. MRI reviewed with interval destruction of L3-4 disc space with epidural enhancemnt concerning for abscess (hard to tell with HW artifact). He was taken to OR 8/07 for evacuation of epidural abscess with frank pus noted tracking along the right paraspinal muscles and right L4 screw. He underwent L3-4 laminectomies with purulent material in the right lateral recess and a large phlegmon that was removed and irrigated. Gram  stain was negative and culture is still pending.   ER visit in May 2022 for schizophrenia and Heroin abuse. Notes reviewed: Patient endorses using about 3 to 4 g daily of heroin for the past 3 to 4 years.  Patient reports that he last used heroin at 11:00 PM on 08/25/2020 in which he states he used "3 bags" of heroin via injection at that time.  Patient reports that he usually uses heroin every 3 hours daily.  Patient states that he began using heroin because he was unable to receive opioid pain medications for his back pain, which led him to begin using heroin to help with his back pain.    Review of Systems: Review of Systems  Unable to perform ROS: Mental status change  Falling asleep during conversation.    Past Medical History:  Diagnosis Date   Anxiety    Arthritis    Asthma    Depression    Duodenal adenoma    GERD (gastroesophageal reflux disease)    Hypertension    Pneumonia    hx   Post-traumatic osteoarthritis of left ankle  Pre-diabetes    Presence of right artificial knee joint 12/17/2016   Schizophrenia ([place])    Stroke ([place])    2009-or10    Social History   Tobacco Use   Smoking status: Heavy Smoker    Packs/day: 1.50    Years: 38.00    Pack years: 57.00    Types: Cigarettes   Smokeless tobacco: Never   Tobacco comments:    onset age 29; upto 1.5 ppd  Vaping Use   Vaping Use: Never used  Substance Use Topics   Alcohol use: Not Currently    Alcohol/week: 3.0 standard drinks    Types: 3 Cans of beer per week    Comment: quit 2015; formerly up to 2 fifths/day   Drug use: Yes    Comment: Heroin     Family History  Adopted: Yes  Problem Relation Age of Onset   Colon cancer Neg Hx    Esophageal cancer Neg Hx    Rectal cancer Neg Hx    Stomach cancer Neg Hx    Allergies  Allergen Reactions   Chlorhexidine Itching and Rash    OBJECTIVE: Blood pressure (!) 159/102, pulse (!) 108, temperature 99.5 F (37.5 C), temperature source Oral, resp. rate  19, height _0  (1.753 m), weight 78.4 kg, SpO2 99 %.   Physical Exam Vitals reviewed.  Constitutional:      Appearance: He is well-developed.     Comments: Seated comfortably in chair during visit.   HENT:     Mouth/Throat:     Dentition: Normal dentition. No dental abscesses.     Comments: Poor dentition  Cardiovascular:     Rate and Rhythm: Normal rate and regular rhythm.     Heart sounds: Normal heart sounds.  Pulmonary:     Effort: Pulmonary effort is normal.     Breath sounds: Normal breath sounds.  Abdominal:     General: There is no distension.     Palpations: Abdomen is soft.     Tenderness: There is no abdominal tenderness.  Musculoskeletal:     Comments: Rt knee with well healed incision overlying patella. No warmth, erythema, open skin or effusion noted. He reports TTP at anterior patella / femur   Lymphadenopathy:     Cervical: No cervical adenopathy.  Skin:    General: Skin is warm and dry.     Findings: No rash.  Neurological:     Mental Status: He is alert and oriented to person, place, and time.     Comments: Falling asleep during conversation  Psychiatric:        Judgment: Judgment normal.     Lab Results Lab Results  Component Value Date   WBC 16.7 (H) 11/04/2020   HGB 11.2 (L) 11/04/2020   HCT 33.2 (L) 11/04/2020   MCV 82.2 11/04/2020   PLT 347 11/04/2020    Lab Results  Component Value Date   CREATININE 0.84 11/04/2020   BUN 13 11/04/2020   NA 131 (L) 11/04/2020   K 3.4 (L) 11/04/2020   CL 98 11/04/2020   CO2 22 11/04/2020    Lab Results  Component Value Date   ALT 12 11/02/2020   AST 34 11/02/2020   ALKPHOS 103 11/02/2020   BILITOT 0.5 11/02/2020     Microbiology: Recent Results (from the past 240 hour(s))  Resp Panel by RT-PCR (Flu A&B, Covid) Urine, Unspecified Source     Status: None   Collection Time: 11/02/20  4:13 PM   Specimen: Urine, Unspecified  Source; Nasopharyngeal(NP) swabs in vial transport medium  Result Value  Ref Range Status   SARS Coronavirus 2 by RT PCR NEGATIVE NEGATIVE Final    Comment: (NOTE) SARS-CoV-2 target nucleic acids are NOT DETECTED.  The SARS-CoV-2 RNA is generally detectable in upper respiratory specimens during the acute phase of infection. The lowest concentration of SARS-CoV-2 viral copies this assay can detect is 138 copies/mL. A negative result does not preclude SARS-Cov-2 infection and should not be used as the sole basis for treatment or other patient management decisions. A negative result may occur with  improper specimen collection/handling, submission of specimen other than nasopharyngeal swab, presence of viral mutation(s) within the areas targeted by this assay, and inadequate number of viral copies(<138 copies/mL). A negative result must be combined with clinical observations, patient history, and epidemiological information. The expected result is Negative.  Fact Sheet for Patients:  EntrepreneurPulse.com.au  Fact Sheet for Healthcare Providers:  IncredibleEmployment.be  This test is no t yet approved or cleared by the [place] FDA and  has been authorized for detection and/or diagnosis of SARS-CoV-2 by FDA under an Emergency Use Authorization (EUA). This EUA will remain  in effect (meaning this test can be used) for the duration of the COVID-19 declaration under Section 564(b)(1) of the Act, 21 U.S.C.section 360bbb-3(b)(1), unless the authorization is terminated  or revoked sooner.       Influenza A by PCR NEGATIVE NEGATIVE Final   Influenza B by PCR NEGATIVE NEGATIVE Final    Comment: (NOTE) The Xpert Xpress SARS-CoV-2/FLU/RSV plus assay is intended as an aid in the diagnosis of influenza from Nasopharyngeal swab specimens and should not be used as a sole basis for treatment. Nasal washings and aspirates are unacceptable for Xpert Xpress SARS-CoV-2/FLU/RSV testing.  Fact Sheet for  Patients: EntrepreneurPulse.com.au  Fact Sheet for Healthcare Providers: IncredibleEmployment.be  This test is not yet approved or cleared by the [place] FDA and has been authorized for detection and/or diagnosis of SARS-CoV-2 by FDA under an Emergency Use Authorization (EUA). This EUA will remain in effect (meaning this test can be used) for the duration of the COVID-19 declaration under Section 564(b)(1) of the Act, 21 U.S.C. section 360bbb-3(b)(1), unless the authorization is terminated or revoked.  Performed at [facility], [place] [address] [place], [place] [address]   Surgical pcr screen     Status: None   Collection Time: 11/03/20  2:14 PM   Specimen: Nasal Mucosa; Nasal Swab  Result Value Ref Range Status   MRSA, PCR NEGATIVE NEGATIVE Final   Staphylococcus aureus NEGATIVE NEGATIVE Final    Comment: (NOTE) The Xpert [name] Assay (FDA approved for NASAL specimens in patients 36 years of age and older), is one component of a comprehensive surveillance program. It is not intended to diagnose infection nor to guide or monitor treatment. Performed at [facility], [place] [address] [place], [place] [address]   Aerobic/Anaerobic Culture w Gram Stain (surgical/deep wound)     Status: None (Preliminary result)   Collection Time: 11/03/20  3:49 PM   Specimen: Abscess  Result Value Ref Range Status   Specimen Description ABSCESS  Final   Special Requests LUMBAR EPIDURAL ABSCESS SPEC A  Final   Gram Stain   Final    RARE WBC PRESENT, PREDOMINANTLY PMN NO ORGANISMS SEEN Performed at [facility], [place] [address] [place], [place] [address]    Culture PENDING  Incomplete   Report Status PENDING  Incomplete  Brian Madeira, MSN, NP-C [facility] for [facility] [place] Cell: [phone] Pager: [phone]  11/04/2020 8:55 AM

## 2020-11-04 NOTE — Progress Notes (Signed)
PROGRESS NOTE    [name]  VQQ:[id number] DOB: 1965-06-23 DOA: 11/02/2020 PCP: [name], MD   Brief Narrative:  55 year old with recurrent discitis admitted October 2021 for lumbar discitis with phlegmon require prolonged antibiotic course admitted for lower back pain for 2 days.  MRI showed epidural abscess and right-sided psoas myositis, neurosurgery consulted.  Patient underwent L3/L4 laminectomies with evacuation of epidural abscess.  No further restriction or brace needed per neurosurgery.  Infectious disease consulted.   Assessment & Plan:   Principal Problem:   Epidural abscess Active Problems:   Benign essential HTN  L3-L4 discitis with osteomyelitis; recurrent Issue.  Status post laminectomy and evacuation of epidural abscess 8/7 Epidural abscess with psoas myositis - Seen by neurosurgery, status post 8/7.  Activity as tolerated, no restriction, no brace needed per neurosurgery - Pain control-IV Dilaudid as needed, increase oxycodone 10 mg - Seen by ID-vancomycin and cefepime postop - Follow-up cultures  Essential hypertension - IV hydralazine as needed  Prediabetes - A1c 5.9 - Insulin sliding scale and Accu-Chek  History of schizophrenia - Resume home meds   History of COPD -As needed bronchodilators  History of cocaine use/alcohol use - UDS positive for opiates and cocaine.  Counseled to quit using this   DVT prophylaxis: SCDs Start: 11/03/20 0210  Code Status: Full Family Communication:    Status is: Inpatient  Remains inpatient appropriate because:Inpatient level of care appropriate due to severity of illness  Dispo: The patient is from: Home              Anticipated d/c is to: Home              Patient currently is not medically stable to d/c.   Difficult to place patient No      Subjective: Has low back pain and burning sensation in his feet. No other complaints.   Review of Systems Otherwise negative except as per HPI,  including: General = no fevers, chills, dizziness,  fatigue HEENT/EYES = negative for loss of vision, double vision, blurred vision,  sore throa Cardiovascular= negative for chest pain, palpitation Respiratory/lungs= negative for shortness of breath, cough, wheezing; hemoptysis,  Gastrointestinal= negative for nausea, vomiting, abdominal pain Genitourinary= negative for Dysuria MSK = Negative for arthralgia, myalgias Neurology= Negative for headache, numbness, tingling  Psychiatry= Negative for suicidal and homocidal ideation Skin= Negative for Rash   Examination: Constitutional: Not in acute distress Respiratory: Clear to auscultation bilaterally Cardiovascular: Normal sinus rhythm, no rubs Abdomen: Nontender nondistended good bowel sounds Musculoskeletal: No edema noted Skin: No rashes seen Neurologic: CN 2-12 grossly intact.  And nonfocal Psychiatric: Normal judgment and insight. Alert and oriented x 3. Normal mood.  Surgical site looks ok.   Objective: Vitals:   11/03/20 1935 11/04/20 0016 11/04/20 0356 11/04/20 0728  BP: (!) 164/102 (!) 143/93 (!) 151/102 (!) 159/102  Pulse: (!) 101 (!) 102 (!) 109 (!) 108  Resp: 18 18 17 19   Temp: 98.2 F (36.8 C) 98.6 F (37 C) 98.9 F (37.2 C) 99.5 F (37.5 C)  TempSrc: Oral Oral Oral Oral  SpO2: 98% 99% 97% 99%  Weight:   78.4 kg   Height:        Intake/Output Summary (Last 24 hours) at 11/04/2020 0747 Last data filed at 11/04/2020 0732 Gross per 24 hour  Intake 3105.56 ml  Output 2950 ml  Net 155.56 ml   Filed Weights   11/03/20 1850 11/04/20 0356  Weight: 77.3 kg 78.4 kg  Data Reviewed:   CBC: Recent Labs  Lab 11/02/20 1612 11/04/20 0228  WBC 21.0* 16.7*  NEUTROABS 19.3*  --   HGB 11.9* 11.2*  HCT 35.6* 33.2*  MCV 83.4 82.2  PLT 326 347   Basic Metabolic Panel: Recent Labs  Lab 11/02/20 1612 11/04/20 0228  NA 131* 131*  K 3.5 3.4*  CL 95* 98  CO2 25 22  GLUCOSE 133* 127*  BUN 8 13  CREATININE  0.71 0.84  CALCIUM 9.3 8.9  MG  --  2.0   GFR: Estimated Creatinine Clearance: 99.4 mL/min (by C-G formula based on SCr of 0.84 mg/dL). Liver Function Tests: Recent Labs  Lab 11/02/20 1612  AST 34  ALT 12  ALKPHOS 103  BILITOT 0.5  PROT 7.7  ALBUMIN 3.8   No results for input(s): LIPASE, AMYLASE in the last 168 hours. No results for input(s): AMMONIA in the last 168 hours. Coagulation Profile: No results for input(s): INR, PROTIME in the last 168 hours. Cardiac Enzymes: No results for input(s): CKTOTAL, CKMB, CKMBINDEX, TROPONINI in the last 168 hours. BNP (last 3 results) No results for input(s): PROBNP in the last 8760 hours. HbA1C: Recent Labs    11/03/20 0633  HGBA1C 5.9*   CBG: Recent Labs  Lab 11/03/20 1110 11/03/20 1424 11/04/20 0057 11/04/20 0430 11/04/20 0730  GLUCAP 125* 109* 141* 150* 140*   Lipid Profile: No results for input(s): CHOL, HDL, LDLCALC, TRIG, CHOLHDL, LDLDIRECT in the last 72 hours. Thyroid Function Tests: No results for input(s): TSH, T4TOTAL, FREET4, T3FREE, THYROIDAB in the last 72 hours. Anemia Panel: No results for input(s): VITAMINB12, FOLATE, FERRITIN, TIBC, IRON, RETICCTPCT in the last 72 hours. Sepsis Labs: Recent Labs  Lab 11/02/20 2000  LATICACIDVEN 1.6    Recent Results (from the past 240 hour(s))  Resp Panel by RT-PCR (Flu A&B, Covid) Urine, Unspecified Source     Status: None   Collection Time: 11/02/20  4:13 PM   Specimen: Urine, Unspecified Source; Nasopharyngeal(NP) swabs in vial transport medium  Result Value Ref Range Status   SARS Coronavirus 2 by RT PCR NEGATIVE NEGATIVE Final    Comment: (NOTE) SARS-CoV-2 target nucleic acids are NOT DETECTED.  The SARS-CoV-2 RNA is generally detectable in upper respiratory specimens during the acute phase of infection. The lowest concentration of SARS-CoV-2 viral copies this assay can detect is 138 copies/mL. A negative result does not preclude SARS-Cov-2 infection and  should not be used as the sole basis for treatment or other patient management decisions. A negative result may occur with  improper specimen collection/handling, submission of specimen other than nasopharyngeal swab, presence of viral mutation(s) within the areas targeted by this assay, and inadequate number of viral copies(<138 copies/mL). A negative result must be combined with clinical observations, patient history, and epidemiological information. The expected result is Negative.  Fact Sheet for Patients:  BloggerCourse.com  Fact Sheet for Healthcare Providers:  SeriousBroker.it  This test is no t yet approved or cleared by the [place] FDA and  has been authorized for detection and/or diagnosis of SARS-CoV-2 by FDA under an Emergency Use Authorization (EUA). This EUA will remain  in effect (meaning this test can be used) for the duration of the COVID-19 declaration under Section 564(b)(1) of the Act, 21 U.S.C.section 360bbb-3(b)(1), unless the authorization is terminated  or revoked sooner.       Influenza A by PCR NEGATIVE NEGATIVE Final   Influenza B by PCR NEGATIVE NEGATIVE Final    Comment: (NOTE) The  Xpert Xpress SARS-CoV-2/FLU/RSV plus assay is intended as an aid in the diagnosis of influenza from Nasopharyngeal swab specimens and should not be used as a sole basis for treatment. Nasal washings and aspirates are unacceptable for Xpert Xpress SARS-CoV-2/FLU/RSV testing.  Fact Sheet for Patients: BloggerCourse.com  Fact Sheet for Healthcare Providers: SeriousBroker.it  This test is not yet approved or cleared by the [place] FDA and has been authorized for detection and/or diagnosis of SARS-CoV-2 by FDA under an Emergency Use Authorization (EUA). This EUA will remain in effect (meaning this test can be used) for the duration of the COVID-19 declaration  under Section 564(b)(1) of the Act, 21 U.S.C. section 360bbb-3(b)(1), unless the authorization is terminated or revoked.  Performed at [facility], [address], [place], [place] [address]   Surgical pcr screen     Status: None   Collection Time: 11/03/20  2:14 PM   Specimen: Nasal Mucosa; Nasal Swab  Result Value Ref Range Status   MRSA, PCR NEGATIVE NEGATIVE Final   Staphylococcus aureus NEGATIVE NEGATIVE Final    Comment: (NOTE) The Xpert SA Assay (FDA approved for NASAL specimens in patients 71 years of age and older), is one component of a comprehensive surveillance program. It is not intended to diagnose infection nor to guide or monitor treatment. Performed at [facility], [facility], [address] [place], [place] [address]   Aerobic/Anaerobic Culture w Gram Stain (surgical/deep wound)     Status: None (Preliminary result)   Collection Time: 11/03/20  3:49 PM   Specimen: Abscess  Result Value Ref Range Status   Specimen Description ABSCESS  Final   Special Requests LUMBAR EPIDURAL ABSCESS SPEC A  Final   Gram Stain   Final    RARE WBC PRESENT, PREDOMINANTLY PMN NO ORGANISMS SEEN Performed at [facility], [address], [place], [place] [address]    Culture PENDING  Incomplete   Report Status PENDING  Incomplete         Radiology Studies: DG Lumbar Spine Complete  Result Date: 11/02/2020 CLINICAL DATA:  Low back pain EXAM: LUMBAR SPINE - COMPLETE 4+ VIEW COMPARISON:  MR lumbar spine, 02/14/2020 FINDINGS: No fracture or dislocation of the lumbar spine. Focally severe disc space height loss and ankylosis of L1-L2 status post discitis osteomyelitis. Otherwise mild multilevel disc space height loss and osteophytosis, status post posterior lumbar discectomy and fusion of L4-L5. Moderate multilevel facet degenerative change. Nonobstructive pattern of overlying bowel gas. IMPRESSION: 1.  No fracture or dislocation of the lumbar spine. 2. Focally  severe disc space height loss and ankylosis of L1-L2 status post discitis osteomyelitis, as seen on prior MR. 3. Otherwise mild multilevel disc space height loss and osteophytosis, status post posterior lumbar discectomy and fusion of L4-L5. 4. Lumbar disc and neural foraminal pathology may be further evaluated by MRI if indicated by neurologically localizing signs and symptoms. Electronically Signed   By: Lauralyn Primes M.D.   On: 11/02/2020 16:52   MR Lumbar Spine W Wo Contrast  Result Date: 11/02/2020 CLINICAL DATA:  Low back pain with possible infection EXAM: MRI LUMBAR SPINE WITHOUT AND WITH CONTRAST TECHNIQUE: Multiplanar and multiecho pulse sequences of the lumbar spine were obtained without and with intravenous contrast. CONTRAST:  7.49mL GADAVIST GADOBUTROL 1 MMOL/ML IV SOLN COMPARISON:  02/14/2020 FINDINGS: Segmentation:  Standard. Alignment: Unchanged grade 1 retrolisthesis at L2-3 grade 1 anterolisthesis at L4-5. Vertebrae: There is hyperintense T2-weighted signal and abnormal contrast enhancement within the L3 and  L4 vertebral bodies. There is edema in the disc space. L4-5 PLIF. Conus medullaris and cauda equina: Conus extends to the L1 level. There is diffuse abnormal contrast enhancement within the epidural space extending the length of the lumbar spine but worst at the L3-L5 levels. Paraspinal and other soft tissues: There is edema and abnormal contrast enhancement in the medial right psoas muscle.No intramuscular abscess. Prevertebral phlegmonous change. Disc levels: L1-2: Severe disc space narrowing, unchanged. L2-3: Unchanged small disc bulge. Moderate spinal canal stenosis. Circumferential epidural enhancement effaces the thecal sac. L3-4: Unchanged small disc bulge. Circumferential epidural enhancement with severe narrowing of the thecal sac. L4-5: Intermediate sized disc bulge. PLIF. Circumferential epidural contrast enhancement severely narrows the thecal sac with crowding of the nerve roots.  Findings at this level have progressed. L5-S1: Normal disc space. No spinal canal stenosis. No epidural enhancement at this level. IMPRESSION: 1. New discitis/osteomyelitis at L3-L4 with epidural abscess/phlegmon extending the length of the lumbar spine but worst at L3-L5. This results in severe narrowing of the thecal sac at both levels with crowding of the nerve roots. 2. Abnormal contrast enhancement in the medial right psoas muscle consistent with myositis. No intramuscular abscess. 3. Prevertebral phlegmonous change. Electronically Signed   By: Deatra Robinson M.D.   On: 11/02/2020 22:04   DG Knee Complete 4 Views Right  Result Date: 11/02/2020 CLINICAL DATA:  Right knee pain EXAM: RIGHT KNEE - COMPLETE 4+ VIEW COMPARISON:  None. FINDINGS: Right total knee arthroplasty has been performed. Normal alignment. No acute fracture or dislocation. There is extensive heterotopic ossification adjacent to the inferior pole of the patella which may result in some degree of impingement on full extension of the knee, however, this is not well assessed on this examination. There is mild infiltration of Hoffa's fat, nonspecific in the setting of prior arthroplasty. There is thickening and mild infiltration surrounding the distal patellar tendon which may reflect trauma or inflammation involving the patellar tendon. No effusion. Mild prepatellar soft tissue swelling. IMPRESSION: Mild prepatellar soft tissue swelling with more focal thickening and peritendinous infiltration surrounding the distal patellar tendon which may reflect inflammation or trauma involving this structure. This may be better assessed with MRI examination. Extensive heterotopic ossification surrounding the inferior pole of the patella which may result in impingement of full extension of the right knee. Correlation with clinical examination is recommended. No acute fracture or dislocation. Electronically Signed   By: Helyn Numbers MD   On: 11/02/2020 16:57    DG Hip Unilat With Pelvis 2-3 Views Right  Result Date: 11/02/2020 CLINICAL DATA:  Low back pain radiating down right leg EXAM: DG HIP (WITH OR WITHOUT PELVIS) 2-3V RIGHT COMPARISON:  None. FINDINGS: There is no evidence of hip fracture or dislocation. There is no evidence of arthropathy or other focal bone abnormality. IMPRESSION: No displaced fracture or dislocation of the right hip. Joint spaces are preserved. Electronically Signed   By: Lauralyn Primes M.D.   On: 11/02/2020 16:49        Scheduled Meds:  benztropine  1 mg Oral Daily   buPROPion  300 mg Oral Daily   ferrous sulfate  325 mg Oral Q breakfast   insulin aspart  0-9 Units Subcutaneous Q4H   lidocaine  1 patch Transdermal Q24H   melatonin  10 mg Oral QHS   metoprolol succinate  50 mg Oral Daily   mirtazapine  15 mg Oral QHS   nicotine  14 mg Transdermal Daily   pantoprazole  40  mg Oral Daily   potassium chloride  40 mEq Oral Once   Continuous Infusions:     LOS: 1 day   Time spent= 35 mins    Jarick Harkins Joline Maxcy, MD [facility]  If 7PM-7AM, please contact night-coverage  11/04/2020, 7:47 AM

## 2020-11-04 NOTE — Progress Notes (Signed)
Neurosurgery Service Progress Note  Subjective: No acute events overnight, preop RLE pain and low back pain improved from preop   Objective: Vitals:   11/03/20 1935 11/04/20 0016 11/04/20 0356 11/04/20 0728  BP: (!) 164/102 (!) 143/93 (!) 151/102 (!) 159/102  Pulse: (!) 101 (!) 102 (!) 109 (!) 108  Resp: '18 18 17 19  '$ Temp: 98.2 F (36.8 C) 98.6 F (37 C) 98.9 F (37.2 C) 99.5 F (37.5 C)  TempSrc: Oral Oral Oral Oral  SpO2: 98% 99% 97% 99%  Weight:   78.4 kg   Height:        Physical Exam: Strength pain limited but appears to be 5/5 x4, SILTx4 except stocking-fingertip numbness Incision c/d/i  Assessment & Plan: 55 y.o. man s/p L4 and L5 laminectomies for epidural abscess, recovering well.  -gram stain surprisingly negative, Cx NGTD, specimen was a thick collection of pus so I would expect these to grow an organism -activity as tolerated, no restrictions  Judith Part  11/04/20 7:54 AM

## 2020-11-04 NOTE — Progress Notes (Signed)
Pt report pain 10 on a scale of 0 to 10, Pt has been taking his prn dilaudid every two hours as well his prn oxycodone but pain not controlled. MD on-call paged and notified, no new orders received. Pt in bed with call light within reach. Will continue to clolsely monitor. Delia Heady RN

## 2020-11-05 LAB — GLUCOSE, CAPILLARY
Glucose-Capillary: 127 mg/dL — ABNORMAL HIGH (ref 70–99)
Glucose-Capillary: 174 mg/dL — ABNORMAL HIGH (ref 70–99)
Glucose-Capillary: 124 mg/dL — ABNORMAL HIGH (ref 70–99)
Glucose-Capillary: 129 mg/dL — ABNORMAL HIGH (ref 70–99)

## 2020-11-05 LAB — BASIC METABOLIC PANEL
Anion gap: 10 (ref 5–15)
BUN: 13 mg/dL (ref 6–20)
CO2: 21 mmol/L — ABNORMAL LOW (ref 22–32)
Calcium: 8.5 mg/dL — ABNORMAL LOW (ref 8.9–10.3)
Chloride: 95 mmol/L — ABNORMAL LOW (ref 98–111)
Creatinine, Ser: 0.67 mg/dL (ref 0.61–1.24)
GFR, Estimated: >60 mL/min (ref >60)
Glucose, Bld: 130 mg/dL — ABNORMAL HIGH (ref 70–99)
Potassium: 3.2 mmol/L — ABNORMAL LOW (ref 3.5–5.1)
Sodium: 126 mmol/L — ABNORMAL LOW (ref 135–145)

## 2020-11-05 LAB — C-REACTIVE PROTEIN: CRP: 15.1 mg/dL — ABNORMAL HIGH (ref <1.0)

## 2020-11-05 LAB — HEPATITIS B SURFACE ANTIGEN: Hepatitis B Surface Ag: NONREACTIVE

## 2020-11-05 LAB — HEPATITIS B CORE ANTIBODY, TOTAL: Hep B Core Total Ab: NONREACTIVE

## 2020-11-05 LAB — CBC
HCT: 31.9 % — ABNORMAL LOW (ref 39.0–52.0)
Hemoglobin: 10.5 g/dL — ABNORMAL LOW (ref 13.0–17.0)
MCH: 27.3 pg (ref 26.0–34.0)
MCHC: 32.9 g/dL (ref 30.0–36.0)
MCV: 83.1 fL (ref 80.0–100.0)
Platelets: 334 10*3/uL (ref 150–400)
RBC: 3.84 MIL/uL — ABNORMAL LOW (ref 4.22–5.81)
RDW: 14.6 % (ref 11.5–15.5)
WBC: 14.9 10*3/uL — ABNORMAL HIGH (ref 4.0–10.5)
nRBC: 0 % (ref 0.0–0.2)

## 2020-11-05 LAB — SEDIMENTATION RATE: Sed Rate: 85 mm/hr — ABNORMAL HIGH (ref 0–16)

## 2020-11-05 LAB — MAGNESIUM: Magnesium: 1.7 mg/dL (ref 1.7–2.4)

## 2020-11-05 MED ORDER — CHLORDIAZEPOXIDE HCL 5 MG PO CAPS
10.0000 mg | ORAL_CAPSULE | Freq: Three times a day (TID) | ORAL | Status: AC
  Administered 2020-11-05 (×2): 10 mg via ORAL
  Filled 2020-11-05: qty 2
  Filled 2020-11-05 (×2): qty 1
  Filled 2020-11-05: qty 2

## 2020-11-05 MED ORDER — SODIUM CHLORIDE 0.9 % IV SOLN: INTRAVENOUS | Status: DC

## 2020-11-05 MED ORDER — POTASSIUM CHLORIDE CRYS ER 20 MEQ PO TBCR
40.0000 meq | EXTENDED_RELEASE_TABLET | ORAL | Status: AC
  Administered 2020-11-05 (×2): 40 meq via ORAL
  Filled 2020-11-05 (×2): qty 2

## 2020-11-05 MED ORDER — CHLORDIAZEPOXIDE HCL 5 MG PO CAPS
10.0000 mg | ORAL_CAPSULE | Freq: Once | ORAL | Status: AC
  Administered 2020-11-07: 10 mg via ORAL
  Filled 2020-11-05: qty 2

## 2020-11-05 MED ORDER — CHLORDIAZEPOXIDE HCL 5 MG PO CAPS
10.0000 mg | ORAL_CAPSULE | Freq: Two times a day (BID) | ORAL | Status: AC
  Administered 2020-11-06 (×2): 10 mg via ORAL
  Filled 2020-11-05 (×2): qty 2

## 2020-11-05 MED ORDER — MAGNESIUM OXIDE -MG SUPPLEMENT 400 (240 MG) MG PO TABS
800.0000 mg | ORAL_TABLET | Freq: Once | ORAL | Status: AC
  Administered 2020-11-05: 800 mg via ORAL
  Filled 2020-11-05: qty 2

## 2020-11-05 NOTE — Progress Notes (Signed)
PROGRESS NOTE    [name]  ZOX:[id number] DOB: 1965-11-21 DOA: 11/02/2020 PCP: [name], MD   Brief Narrative:  55 year old with recurrent discitis admitted October 2021 for lumbar discitis with phlegmon require prolonged antibiotic course admitted for lower back pain for 2 days.  MRI showed epidural abscess and right-sided psoas myositis, neurosurgery consulted.  Patient underwent L3/L4 laminectomies with evacuation of epidural abscess.  No further restriction or brace needed per neurosurgery.  Infectious disease consulted..  Started on IV antibiotics.  Try minimize IV narcotic use.   Assessment & Plan:   Principal Problem:   Epidural abscess Active Problems:   Benign essential HTN  L3-L4 discitis with osteomyelitis; recurrent Issue.  Status post laminectomy and evacuation of epidural abscess 8/7 Epidural abscess with psoas myositis - Seen by neurosurgery, status post 8/7.  Activity as tolerated, no restriction, no brace needed per neurosurgery - Pain control-continue oral oxycodone IR 10 mg, discontinue IV Dilaudid.  Continues to ask for IV drugs even when he appears to be comfortable. - Seen by ID-vancomycin and cefepime postop - Cultures remain negative.  Continue to follow -Will require PICC line eventually  Hypokalemia/hyponatremia/hypomagnesemia - Suspect dehydration, repletion ordered.  Normal saline 75 cc/h X 24 hours  Essential hypertension - IV hydralazine as needed  Prediabetes - A1c 5.9 - Insulin sliding scale and Accu-Chek  History of schizophrenia - Resume home meds   History of COPD -As needed bronchodilators  History of cocaine use/alcohol use - UDS positive for opiates and cocaine.  Counseled to quit using this -Librium taper   DVT prophylaxis: SCDs Start: 11/03/20 0210  Code Status: Full Family Communication:    Status is: Inpatient  Remains inpatient appropriate because:Inpatient level of care appropriate due to severity of  illness  Dispo: The patient is from: Home              Anticipated d/c is to: Home              Patient currently is not medically stable to d/c.  Maintain hospital stay for IV antibiotics, monitoring culture data.   Difficult to place patient No      Subjective: When I walked in the room patient was sleeping, when I woke him up first thing the started asking for his IV pain medications.  According to nursing staff he was quite drowsy earlier and wanted to take his IV Dilaudid and oral oxycodone together. I have explained him that we will be discontinuing his IV Dilaudid and controlling his pain with oral oxycodone.  He will get IV pain medications upon clinical assessment if deemed appropriate.  Review of Systems Otherwise negative except as per HPI, including: General: Denies fever, chills, night sweats or unintended weight loss. Resp: Denies cough, wheezing, shortness of breath. Cardiac: Denies chest pain, palpitations, orthopnea, paroxysmal nocturnal dyspnea. GI: Denies abdominal pain, nausea, vomiting, diarrhea or constipation GU: Denies dysuria, frequency, hesitancy or incontinence MS: Denies muscle aches, joint pain or swelling Neuro: Denies headache, neurologic deficits (focal weakness, numbness, tingling), abnormal gait Psych: Denies anxiety, depression, SI/HI/AVH Skin: Denies new rashes or lesions ID: Denies sick contacts, exotic exposures, travel  Examination: Constitutional: Not in acute distress Respiratory: Clear to auscultation bilaterally Cardiovascular: Normal sinus rhythm, no rubs Abdomen: Nontender nondistended good bowel sounds Musculoskeletal: No edema noted Skin: No rashes seen Neurologic: CN 2-12 grossly intact.  And nonfocal Psychiatric: Normal judgment and insight. Alert and oriented x 3. Normal mood. Surgical site looks ok.   Objective: Vitals:  11/04/20 1101 11/04/20 1612 11/04/20 1918 11/05/20 0330  BP: (!) 161/98 (!) 159/102 (!) 155/103 (!)  165/101  Pulse: 93 98 97 91  Resp: 18 18 20 17   Temp: 99.3 F (37.4 C) 99.1 F (37.3 C) (!) 100.4 F (38 C) 98 F (36.7 C)  TempSrc: Oral Oral Oral Oral  SpO2: 98% 95% 98% 100%  Weight:    78.2 kg  Height:        Intake/Output Summary (Last 24 hours) at 11/05/2020 0802 Last data filed at 11/05/2020 0758 Gross per 24 hour  Intake 2285.29 ml  Output 3950 ml  Net -1664.71 ml   Filed Weights   11/03/20 1850 11/04/20 0356 11/05/20 0330  Weight: 77.3 kg 78.4 kg 78.2 kg     Data Reviewed:   CBC: Recent Labs  Lab 11/02/20 1612 11/04/20 0228 11/05/20 0217  WBC 21.0* 16.7* 14.9*  NEUTROABS 19.3*  --   --   HGB 11.9* 11.2* 10.5*  HCT 35.6* 33.2* 31.9*  MCV 83.4 82.2 83.1  PLT 326 347 334   Basic Metabolic Panel: Recent Labs  Lab 11/02/20 1612 11/04/20 0228 11/05/20 0217  NA 131* 131* 126*  K 3.5 3.4* 3.2*  CL 95* 98 95*  CO2 25 22 21*  GLUCOSE 133* 127* 130*  BUN 8 13 13   CREATININE 0.71 0.84 0.67  CALCIUM 9.3 8.9 8.5*  MG  --  2.0 1.7   GFR: Estimated Creatinine Clearance: 104.3 mL/min (by C-G formula based on SCr of 0.67 mg/dL). Liver Function Tests: Recent Labs  Lab 11/02/20 1612  AST 34  ALT 12  ALKPHOS 103  BILITOT 0.5  PROT 7.7  ALBUMIN 3.8   No results for input(s): LIPASE, AMYLASE in the last 168 hours. No results for input(s): AMMONIA in the last 168 hours. Coagulation Profile: No results for input(s): INR, PROTIME in the last 168 hours. Cardiac Enzymes: No results for input(s): CKTOTAL, CKMB, CKMBINDEX, TROPONINI in the last 168 hours. BNP (last 3 results) No results for input(s): PROBNP in the last 8760 hours. HbA1C: Recent Labs    11/03/20 0633  HGBA1C 5.9*   CBG: Recent Labs  Lab 11/04/20 0730 11/04/20 1102 11/04/20 1613 11/04/20 2103 11/05/20 0606  GLUCAP 140* 121* 150* 163* 127*   Lipid Profile: No results for input(s): CHOL, HDL, LDLCALC, TRIG, CHOLHDL, LDLDIRECT in the last 72 hours. Thyroid Function Tests: No results  for input(s): TSH, T4TOTAL, FREET4, T3FREE, THYROIDAB in the last 72 hours. Anemia Panel: No results for input(s): VITAMINB12, FOLATE, FERRITIN, TIBC, IRON, RETICCTPCT in the last 72 hours. Sepsis Labs: Recent Labs  Lab 11/02/20 2000  LATICACIDVEN 1.6    Recent Results (from the past 240 hour(s))  Resp Panel by RT-PCR (Flu A&B, Covid) Urine, Unspecified Source     Status: None   Collection Time: 11/02/20  4:13 PM   Specimen: Urine, Unspecified Source; Nasopharyngeal(NP) swabs in vial transport medium  Result Value Ref Range Status   SARS Coronavirus 2 by RT PCR NEGATIVE NEGATIVE Final    Comment: (NOTE) SARS-CoV-2 target nucleic acids are NOT DETECTED.  The SARS-CoV-2 RNA is generally detectable in upper respiratory specimens during the acute phase of infection. The lowest concentration of SARS-CoV-2 viral copies this assay can detect is 138 copies/mL. A negative result does not preclude SARS-Cov-2 infection and should not be used as the sole basis for treatment or other patient management decisions. A negative result may occur with  improper specimen collection/handling, submission of specimen other than nasopharyngeal swab,  presence of viral mutation(s) within the areas targeted by this assay, and inadequate number of viral copies(<138 copies/mL). A negative result must be combined with clinical observations, patient history, and epidemiological information. The expected result is Negative.  Fact Sheet for Patients:  BloggerCourse.com  Fact Sheet for Healthcare Providers:  SeriousBroker.it  This test is no t yet approved or cleared by the [place] FDA and  has been authorized for detection and/or diagnosis of SARS-CoV-2 by FDA under an Emergency Use Authorization (EUA). This EUA will remain  in effect (meaning this test can be used) for the duration of the COVID-19 declaration under Section 564(b)(1) of the Act,  21 U.S.C.section 360bbb-3(b)(1), unless the authorization is terminated  or revoked sooner.       Influenza A by PCR NEGATIVE NEGATIVE Final   Influenza B by PCR NEGATIVE NEGATIVE Final    Comment: (NOTE) The Xpert Xpress SARS-CoV-2/FLU/RSV plus assay is intended as an aid in the diagnosis of influenza from Nasopharyngeal swab specimens and should not be used as a sole basis for treatment. Nasal washings and aspirates are unacceptable for Xpert Xpress SARS-CoV-2/FLU/RSV testing.  Fact Sheet for Patients: BloggerCourse.com  Fact Sheet for Healthcare Providers: SeriousBroker.it  This test is not yet approved or cleared by the [place] FDA and has been authorized for detection and/or diagnosis of SARS-CoV-2 by FDA under an Emergency Use Authorization (EUA). This EUA will remain in effect (meaning this test can be used) for the duration of the COVID-19 declaration under Section 564(b)(1) of the Act, 21 U.S.C. section 360bbb-3(b)(1), unless the authorization is terminated or revoked.  Performed at [facility], [address], [place], [place] [address]   Blood culture (routine x 2)     Status: None (Preliminary result)   Collection Time: 11/02/20  8:00 PM   Specimen: BLOOD  Result Value Ref Range Status   Specimen Description   Final    BLOOD BLOOD RIGHT HAND Performed at [facility], [address], [place], [place] [address]    Special Requests   Final    BOTTLES DRAWN AEROBIC AND ANAEROBIC Blood Culture adequate volume Performed at [facility], [address] [place], [place] [address]    Culture   Final    NO GROWTH 2 DAYS Performed at [facility], [address] [place], [place] [address]    Report Status PENDING  Incomplete  Blood culture (routine x 2)     Status: None (Preliminary result)   Collection Time: 11/02/20  8:10 PM   Specimen: BLOOD   Result Value Ref Range Status   Specimen Description   Final    BLOOD BLOOD LEFT HAND Performed at [facility], [address], [place], [place] [address]    Special Requests   Final    BOTTLES DRAWN AEROBIC ONLY Blood Culture results may not be optimal due to an inadequate volume of blood received in culture bottles Performed at [facility], [address] [place], [place] [address]    Culture   Final    NO GROWTH 2 DAYS Performed at [facility], [address], [place], [place] [address]    Report Status PENDING  Incomplete  Surgical pcr screen     Status: None   Collection Time: 11/03/20  2:14 PM   Specimen: Nasal Mucosa; Nasal Swab  Result Value Ref Range Status   MRSA, PCR NEGATIVE NEGATIVE Final   Staphylococcus aureus  NEGATIVE NEGATIVE Final    Comment: (NOTE) The Xpert SA Assay (FDA approved for NASAL specimens in patients 61 years of age and older), is one component of a comprehensive surveillance program. It is not intended to diagnose infection nor to guide or monitor treatment. Performed at [facility], [address], [place], [place] [address]   Aerobic/Anaerobic Culture w Gram Stain (surgical/deep wound)     Status: None (Preliminary result)   Collection Time: 11/03/20  3:49 PM   Specimen: Abscess  Result Value Ref Range Status   Specimen Description ABSCESS  Final   Special Requests LUMBAR EPIDURAL ABSCESS SPEC A  Final   Gram Stain   Final    RARE WBC PRESENT, PREDOMINANTLY PMN NO ORGANISMS SEEN    Culture   Final    NO GROWTH < 24 HOURS Performed at [facility], [address] [place], [place] [address]    Report Status PENDING  Incomplete         Radiology Studies: No results found.      Scheduled Meds:  benztropine  1 mg Oral Daily   buPROPion  300 mg Oral Daily   ferrous sulfate  325 mg Oral Q breakfast   insulin aspart  0-9 Units Subcutaneous TID WC   lidocaine  1 patch  Transdermal Q24H   magnesium oxide  800 mg Oral Once   melatonin  10 mg Oral QHS   metoprolol succinate  50 mg Oral Daily   mirtazapine  15 mg Oral QHS   nicotine  14 mg Transdermal Daily   pantoprazole  40 mg Oral Daily   potassium chloride  40 mEq Oral Q4H   sucralfate  1 g Oral TID WC & HS   Continuous Infusions:  sodium chloride     ceFEPime (MAXIPIME) IV 2 g (11/05/20 0225)   vancomycin 750 mg (11/05/20 0256)      LOS: 2 days   Time spent= 35 mins    Aysiah Jurado Joline Maxcy, MD [facility]  If 7PM-7AM, please contact night-coverage  11/05/2020, 8:02 AM

## 2020-11-05 NOTE — Plan of Care (Signed)
  Problem: Education: Goal: Knowledge of General Education information will improve Description: Including pain rating scale, medication(s)/side effects and non-pharmacologic comfort measures Outcome: Progressing   Problem: Health Behavior/Discharge Planning: Goal: Ability to manage health-related needs will improve Outcome: Progressing   Problem: Clinical Measurements: Goal: Ability to maintain clinical measurements within normal limits will improve Outcome: Progressing Goal: Will remain free from infection Outcome: Progressing Goal: Diagnostic test results will improve Outcome: Progressing Goal: Respiratory complications will improve Outcome: Progressing Goal: Cardiovascular complication will be avoided Outcome: Progressing   Problem: Activity: Goal: Risk for activity intolerance will decrease Outcome: Progressing   Problem: Nutrition: Goal: Adequate nutrition will be maintained Outcome: Progressing   Problem: Coping: Goal: Level of anxiety will decrease Outcome: Progressing   Problem: Elimination: Goal: Will not experience complications related to bowel motility Outcome: Progressing Goal: Will not experience complications related to urinary retention Outcome: Progressing   Problem: Pain Managment: Goal: General experience of comfort will improve Outcome: Progressing   Problem: Safety: Goal: Ability to remain free from injury will improve Outcome: Progressing   Problem: Skin Integrity: Goal: Risk for impaired skin integrity will decrease Outcome: Progressing   Problem: Education: Goal: Ability to verbalize activity precautions or restrictions will improve Outcome: Progressing Goal: Knowledge of the prescribed therapeutic regimen will improve Outcome: Progressing Goal: Understanding of discharge needs will improve Outcome: Progressing   Problem: Activity: Goal: Ability to avoid complications of mobility impairment will improve Outcome: Progressing Goal:  Ability to tolerate increased activity will improve Outcome: Progressing Goal: Will remain free from falls Outcome: Progressing   Problem: Bowel/Gastric: Goal: Gastrointestinal status for postoperative course will improve Outcome: Progressing   Problem: Clinical Measurements: Goal: Ability to maintain clinical measurements within normal limits will improve Outcome: Progressing Goal: Postoperative complications will be avoided or minimized Outcome: Progressing Goal: Diagnostic test results will improve Outcome: Progressing   Problem: Pain Management: Goal: Pain level will decrease Outcome: Progressing   Problem: Skin Integrity: Goal: Will show signs of wound healing Outcome: Progressing   Problem: Health Behavior/Discharge Planning: Goal: Identification of resources available to assist in meeting health care needs will improve Outcome: Progressing   Problem: Bladder/Genitourinary: Goal: Urinary functional status for postoperative course will improve Description: Voiding well in large amts.   Outcome: Progressing

## 2020-11-06 LAB — BASIC METABOLIC PANEL
Anion gap: 10 (ref 5–15)
BUN: 10 mg/dL (ref 6–20)
CO2: 18 mmol/L — ABNORMAL LOW (ref 22–32)
Calcium: 8.6 mg/dL — ABNORMAL LOW (ref 8.9–10.3)
Chloride: 94 mmol/L — ABNORMAL LOW (ref 98–111)
Creatinine, Ser: 0.57 mg/dL — ABNORMAL LOW (ref 0.61–1.24)
GFR, Estimated: >60 mL/min (ref >60)
Glucose, Bld: 122 mg/dL — ABNORMAL HIGH (ref 70–99)
Potassium: 3.7 mmol/L (ref 3.5–5.1)
Sodium: 122 mmol/L — ABNORMAL LOW (ref 135–145)

## 2020-11-06 LAB — GLUCOSE, CAPILLARY
Glucose-Capillary: 124 mg/dL — ABNORMAL HIGH (ref 70–99)
Glucose-Capillary: 143 mg/dL — ABNORMAL HIGH (ref 70–99)
Glucose-Capillary: 157 mg/dL — ABNORMAL HIGH (ref 70–99)
Glucose-Capillary: 125 mg/dL — ABNORMAL HIGH (ref 70–99)

## 2020-11-06 LAB — CBC
HCT: 33.3 % — ABNORMAL LOW (ref 39.0–52.0)
Hemoglobin: 11.3 g/dL — ABNORMAL LOW (ref 13.0–17.0)
MCH: 27.9 pg (ref 26.0–34.0)
MCHC: 33.9 g/dL (ref 30.0–36.0)
MCV: 82.2 fL (ref 80.0–100.0)
Platelets: 336 10*3/uL (ref 150–400)
RBC: 4.05 MIL/uL — ABNORMAL LOW (ref 4.22–5.81)
RDW: 14.5 % (ref 11.5–15.5)
WBC: 13.2 10*3/uL — ABNORMAL HIGH (ref 4.0–10.5)
nRBC: 0 % (ref 0.0–0.2)

## 2020-11-06 LAB — MAGNESIUM: Magnesium: 1.7 mg/dL (ref 1.7–2.4)

## 2020-11-06 LAB — HEPATITIS B SURFACE ANTIBODY, QUANTITATIVE: Hep B S AB Quant (Post): <3.1 m[IU]/mL — ABNORMAL LOW (ref >9.9)

## 2020-11-06 MED ORDER — POTASSIUM CHLORIDE CRYS ER 20 MEQ PO TBCR
40.0000 meq | EXTENDED_RELEASE_TABLET | Freq: Once | ORAL | Status: AC
  Administered 2020-11-06: 40 meq via ORAL
  Filled 2020-11-06: qty 2

## 2020-11-06 MED ORDER — MAGNESIUM OXIDE -MG SUPPLEMENT 400 (240 MG) MG PO TABS
800.0000 mg | ORAL_TABLET | Freq: Once | ORAL | Status: AC
  Administered 2020-11-06: 800 mg via ORAL
  Filled 2020-11-06: qty 2

## 2020-11-06 MED ORDER — SODIUM CHLORIDE 0.9 % IV SOLN: INTRAVENOUS | Status: AC

## 2020-11-06 MED FILL — Hydromorphone HCl Inj 1 MG/ML: INTRAMUSCULAR | Qty: 1 | Status: AC

## 2020-11-06 NOTE — Progress Notes (Signed)
Patient lost second IV site tonight due to infiltration.  The second site infiltrated within the hour after IV team placed. Patient with limited access options. Sent page to MD asking about necessity of IV since IVF scheduled to be cut off at 0900. MD stated to refer to day team.

## 2020-11-06 NOTE — Progress Notes (Signed)
Report given to 3W- pt transported to 3W via wheelchair by 3W staff-belongings with pt-pt stable at time of transport

## 2020-11-06 NOTE — Progress Notes (Signed)
PROGRESS NOTE    [name]  LKG:[id number] DOB: 1965/11/18 DOA: 11/02/2020 PCP: [name], MD   Brief Narrative:  55 year old with recurrent discitis admitted October 2021 for lumbar discitis with phlegmon require prolonged antibiotic course admitted for lower back pain for 2 days.  MRI showed epidural abscess and right-sided psoas myositis, neurosurgery consulted.  Patient underwent L3/L4 laminectomies with evacuation of epidural abscess.  No further restriction or brace needed per neurosurgery.  Infectious disease consulted..  Started on IV antibiotics.  Try minimize IV narcotic use.   Assessment & Plan:   Principal Problem:   Epidural abscess Active Problems:   Benign essential HTN  L3-L4 discitis with osteomyelitis; recurrent Issue.  Status post laminectomy and evacuation of epidural abscess 8/7 Epidural abscess with psoas myositis - Seen by neurosurgery, status post 8/7.  Activity as tolerated, no restriction, no brace needed per neurosurgery - Pain control-continue oral oxycodone IR 10 mg, lidocaine patch.  Continues to ask for IV Dilaudid even when he seems comfortable.  Minimize IV narcotic use. - Seen by ID-vancomycin and cefepime postop - Cultures remain negative to date, continue to follow.  Eventually will require PICC line.  Will defer the timing to infectious disease.  Hypokalemia/hyponatremia/hypomagnesemia - Suspect dehydration, repletion ordered.  We will check urine electrolytes.  In the meantime order normal saline 75 cc/h  Essential hypertension - IV hydralazine as needed  Prediabetes.  Sugars in good range - A1c 5.9 - Insulin sliding scale and Accu-Chek  History of schizophrenia - Resume home meds   History of COPD -As needed bronchodilators  History of cocaine use/alcohol use - UDS positive for opiates and cocaine.  Counseled to quit using this -Librium taper Day 2 of 3   DVT prophylaxis: SCDs Start: 11/03/20 0210  Code Status:  Full Family Communication:    Status is: Inpatient  Remains inpatient appropriate because:Inpatient level of care appropriate due to severity of illness  Dispo: The patient is from: Home              Anticipated d/c is to: Home              Patient currently is not medically stable to d/c.  Maintain hospital stay for IV antibiotics, monitoring culture data.   Difficult to place patient No      Subjective: When I walked in the room patient again appears to be comfortable but asking for IV pain medications.  No other complaints.  Remains afebrile overnight.    Examination: Constitutional: Not in acute distress Respiratory: Clear to auscultation bilaterally Cardiovascular: Normal sinus rhythm, no rubs Abdomen: Nontender nondistended good bowel sounds Musculoskeletal: No edema noted Skin: No rashes seen Neurologic: CN 2-12 grossly intact.  And nonfocal Psychiatric: Normal judgment and insight. Alert and oriented x 3. Normal mood. Surgical site looks ok.   Objective: Vitals:   11/05/20 1113 11/05/20 1924 11/06/20 0336 11/06/20 0806  BP: (!) 153/101 (!) 158/96 (!) 157/97 (!) 163/114  Pulse: (!) 108 99 93 (!) 106  Resp: 19 19 20    Temp: 98.1 F (36.7 C) 99 F (37.2 C) 97.7 F (36.5 C) 98 F (36.7 C)  TempSrc: Oral Oral Oral Oral  SpO2: 100% 98% 99% 100%  Weight:   79.1 kg   Height:        Intake/Output Summary (Last 24 hours) at 11/06/2020 0933 Last data filed at 11/06/2020 0900 Gross per 24 hour  Intake 4211.99 ml  Output 4453 ml  Net -241.01 ml  Filed Weights   11/04/20 0356 11/05/20 0330 11/06/20 0336  Weight: 78.4 kg 78.2 kg 79.1 kg     Data Reviewed:   CBC: Recent Labs  Lab 11/02/20 1612 11/04/20 0228 11/05/20 0217 11/06/20 0146  WBC 21.0* 16.7* 14.9* 13.2*  NEUTROABS 19.3*  --   --   --   HGB 11.9* 11.2* 10.5* 11.3*  HCT 35.6* 33.2* 31.9* 33.3*  MCV 83.4 82.2 83.1 82.2  PLT 326 347 334 336   Basic Metabolic Panel: Recent Labs  Lab  11/02/20 1612 11/04/20 0228 11/05/20 0217 11/06/20 0146  NA 131* 131* 126* 122*  K 3.5 3.4* 3.2* 3.7  CL 95* 98 95* 94*  CO2 25 22 21* 18*  GLUCOSE 133* 127* 130* 122*  BUN 8 13 13 10   CREATININE 0.71 0.84 0.67 0.57*  CALCIUM 9.3 8.9 8.5* 8.6*  MG  --  2.0 1.7 1.7   GFR: Estimated Creatinine Clearance: 104.3 mL/min (A) (by C-G formula based on SCr of 0.57 mg/dL (L)). Liver Function Tests: Recent Labs  Lab 11/02/20 1612  AST 34  ALT 12  ALKPHOS 103  BILITOT 0.5  PROT 7.7  ALBUMIN 3.8   No results for input(s): LIPASE, AMYLASE in the last 168 hours. No results for input(s): AMMONIA in the last 168 hours. Coagulation Profile: No results for input(s): INR, PROTIME in the last 168 hours. Cardiac Enzymes: No results for input(s): CKTOTAL, CKMB, CKMBINDEX, TROPONINI in the last 168 hours. BNP (last 3 results) No results for input(s): PROBNP in the last 8760 hours. HbA1C: No results for input(s): HGBA1C in the last 72 hours.  CBG: Recent Labs  Lab 11/05/20 0606 11/05/20 1109 11/05/20 1628 11/05/20 2111 11/06/20 0609  GLUCAP 127* 174* 124* 129* 124*   Lipid Profile: No results for input(s): CHOL, HDL, LDLCALC, TRIG, CHOLHDL, LDLDIRECT in the last 72 hours. Thyroid Function Tests: No results for input(s): TSH, T4TOTAL, FREET4, T3FREE, THYROIDAB in the last 72 hours. Anemia Panel: No results for input(s): VITAMINB12, FOLATE, FERRITIN, TIBC, IRON, RETICCTPCT in the last 72 hours. Sepsis Labs: Recent Labs  Lab 11/02/20 2000  LATICACIDVEN 1.6    Recent Results (from the past 240 hour(s))  Resp Panel by RT-PCR (Flu A&B, Covid) Urine, Unspecified Source     Status: None   Collection Time: 11/02/20  4:13 PM   Specimen: Urine, Unspecified Source; Nasopharyngeal(NP) swabs in vial transport medium  Result Value Ref Range Status   SARS Coronavirus 2 by RT PCR NEGATIVE NEGATIVE Final    Comment: (NOTE) SARS-CoV-2 target nucleic acids are NOT DETECTED.  The SARS-CoV-2  RNA is generally detectable in upper respiratory specimens during the acute phase of infection. The lowest concentration of SARS-CoV-2 viral copies this assay can detect is 138 copies/mL. A negative result does not preclude SARS-Cov-2 infection and should not be used as the sole basis for treatment or other patient management decisions. A negative result may occur with  improper specimen collection/handling, submission of specimen other than nasopharyngeal swab, presence of viral mutation(s) within the areas targeted by this assay, and inadequate number of viral copies(<138 copies/mL). A negative result must be combined with clinical observations, patient history, and epidemiological information. The expected result is Negative.  Fact Sheet for Patients:  BloggerCourse.com  Fact Sheet for Healthcare Providers:  SeriousBroker.it  This test is no t yet approved or cleared by the [place] FDA and  has been authorized for detection and/or diagnosis of SARS-CoV-2 by FDA under an Emergency Use Authorization (EUA). This  EUA will remain  in effect (meaning this test can be used) for the duration of the COVID-19 declaration under Section 564(b)(1) of the Act, 21 U.S.C.section 360bbb-3(b)(1), unless the authorization is terminated  or revoked sooner.       Influenza A by PCR NEGATIVE NEGATIVE Final   Influenza B by PCR NEGATIVE NEGATIVE Final    Comment: (NOTE) The Xpert Xpress SARS-CoV-2/FLU/RSV plus assay is intended as an aid in the diagnosis of influenza from Nasopharyngeal swab specimens and should not be used as a sole basis for treatment. Nasal washings and aspirates are unacceptable for Xpert Xpress SARS-CoV-2/FLU/RSV testing.  Fact Sheet for Patients: BloggerCourse.com  Fact Sheet for Healthcare Providers: SeriousBroker.it  This test is not yet approved or cleared by the  [place] FDA and has been authorized for detection and/or diagnosis of SARS-CoV-2 by FDA under an Emergency Use Authorization (EUA). This EUA will remain in effect (meaning this test can be used) for the duration of the COVID-19 declaration under Section 564(b)(1) of the Act, 21 U.S.C. section 360bbb-3(b)(1), unless the authorization is terminated or revoked.  Performed at [facility], [address], [place], [place] [address]   Blood culture (routine x 2)     Status: None (Preliminary result)   Collection Time: 11/02/20  8:00 PM   Specimen: BLOOD  Result Value Ref Range Status   Specimen Description   Final    BLOOD BLOOD RIGHT HAND Performed at [facility], [address], [place], [place] [address]    Special Requests   Final    BOTTLES DRAWN AEROBIC AND ANAEROBIC Blood Culture adequate volume Performed at [facility], [address], [place], [place] [address]    Culture   Final    NO GROWTH 2 DAYS Performed at [facility], [address], [place], [place] [address]    Report Status PENDING  Incomplete  Blood culture (routine x 2)     Status: None (Preliminary result)   Collection Time: 11/02/20  8:10 PM   Specimen: BLOOD  Result Value Ref Range Status   Specimen Description   Final    BLOOD BLOOD LEFT HAND Performed at [facility], [address], [place], [place] [address]    Special Requests   Final    BOTTLES DRAWN AEROBIC ONLY Blood Culture results may not be optimal due to an inadequate volume of blood received in culture bottles Performed at [facility], [address], [place], [place] [address]    Culture   Final    NO GROWTH 2 DAYS Performed at [facility], [address], [place], [place] [address]    Report Status PENDING  Incomplete  Surgical pcr screen     Status: None   Collection Time: 11/03/20  2:14 PM   Specimen: Nasal Mucosa;  Nasal Swab  Result Value Ref Range Status   MRSA, PCR NEGATIVE NEGATIVE Final   Staphylococcus aureus NEGATIVE NEGATIVE Final    Comment: (NOTE) The Xpert SA Assay (FDA approved for NASAL specimens in patients 71 years of age and older), is one component of a comprehensive surveillance program. It is not intended to diagnose infection nor to guide or monitor treatment. Performed at [facility], [address], [place], [place] [address]   Aerobic/Anaerobic Culture w Gram Stain (surgical/deep wound)     Status: None (Preliminary result)   Collection Time: 11/03/20  3:49 PM   Specimen:  Abscess  Result Value Ref Range Status   Specimen Description ABSCESS  Final   Special Requests LUMBAR EPIDURAL ABSCESS SPEC A  Final   Gram Stain   Final    RARE WBC PRESENT, PREDOMINANTLY PMN NO ORGANISMS SEEN    Culture   Final    RARE GRAM NEGATIVE RODS CULTURE REINCUBATED FOR BETTER GROWTH Performed at [facility], [address], [place], [place] [address]    Report Status PENDING  Incomplete         Radiology Studies: No results found.      Scheduled Meds:  benztropine  1 mg Oral Daily   buPROPion  300 mg Oral Daily   chlordiazePOXIDE  10 mg Oral TID   Followed by   chlordiazePOXIDE  10 mg Oral BID   Followed by   Melene Muller ON 11/07/2020] chlordiazePOXIDE  10 mg Oral Once   ferrous sulfate  325 mg Oral Q breakfast   insulin aspart  0-9 Units Subcutaneous TID WC   lidocaine  1 patch Transdermal Q24H   melatonin  10 mg Oral QHS   metoprolol succinate  50 mg Oral Daily   mirtazapine  15 mg Oral QHS   nicotine  14 mg Transdermal Daily   pantoprazole  40 mg Oral Daily   sucralfate  1 g Oral TID WC & HS   Continuous Infusions:  sodium chloride 75 mL/hr at 11/06/20 0901   ceFEPime (MAXIPIME) IV 2 g (11/06/20 0913)      LOS: 3 days   Time spent= 35 mins    Tiaja Hagan Joline Maxcy, MD [facility]  If 7PM-7AM, please contact night-coverage  11/06/2020,  9:33 AM

## 2020-11-06 NOTE — Progress Notes (Signed)
ID Brief Note  Component 3 d ago   Specimen Description ABSCESS   Special Requests LUMBAR EPIDURAL ABSCESS SPEC A   Gram Stain RARE WBC PRESENT, PREDOMINANTLY PMN  NO ORGANISMS SEEN  Performed at [facility], [place] [address] [place], [place] [address]   Culture RARE PSEUDOMONAS AERUGINOSA  SUSCEPTIBILITIES TO FOLLOW  NO ANAEROBES ISOLATED; CULTURE IN PROGRESS FOR 5 DAYS   Report Status PENDING     Recommendations Will DC Vancomycin Continue cefepime Final recommendations to follow pending sensi  Rosiland Oz, MD Infectious Disease Physician [facility] [address] [place], [place] [address] Phone: [phone]  Fax: [phone]

## 2020-11-07 DIAGNOSIS — G062 Extradural and subdural abscess, unspecified: Secondary | ICD-10-CM | POA: Diagnosis not present

## 2020-11-07 LAB — BASIC METABOLIC PANEL
Anion gap: 11 (ref 5–15)
BUN: 11 mg/dL (ref 6–20)
CO2: 18 mmol/L — ABNORMAL LOW (ref 22–32)
Calcium: 8.5 mg/dL — ABNORMAL LOW (ref 8.9–10.3)
Chloride: 96 mmol/L — ABNORMAL LOW (ref 98–111)
Creatinine, Ser: 0.58 mg/dL — ABNORMAL LOW (ref 0.61–1.24)
GFR, Estimated: >60 mL/min (ref >60)
Glucose, Bld: 134 mg/dL — ABNORMAL HIGH (ref 70–99)
Potassium: 3.3 mmol/L — ABNORMAL LOW (ref 3.5–5.1)
Sodium: 125 mmol/L — ABNORMAL LOW (ref 135–145)

## 2020-11-07 LAB — BLOOD GAS, ARTERIAL
Acid-base deficit: 4.3 mmol/L — ABNORMAL HIGH (ref 0.0–2.0)
Bicarbonate: 19 mmol/L — ABNORMAL LOW (ref 20.0–28.0)
Drawn by: 244901
FIO2: 21
O2 Saturation: 96.7 %
Patient temperature: 37.5
pCO2 arterial: 29 mmHg — ABNORMAL LOW (ref 32.0–48.0)
pH, Arterial: 7.436 (ref 7.350–7.450)
pO2, Arterial: 89.6 mmHg (ref 83.0–108.0)

## 2020-11-07 LAB — CBC
HCT: 32.2 % — ABNORMAL LOW (ref 39.0–52.0)
Hemoglobin: 10.8 g/dL — ABNORMAL LOW (ref 13.0–17.0)
MCH: 27.6 pg (ref 26.0–34.0)
MCHC: 33.5 g/dL (ref 30.0–36.0)
MCV: 82.4 fL (ref 80.0–100.0)
Platelets: 349 10*3/uL (ref 150–400)
RBC: 3.91 MIL/uL — ABNORMAL LOW (ref 4.22–5.81)
RDW: 14.3 % (ref 11.5–15.5)
WBC: 14.5 10*3/uL — ABNORMAL HIGH (ref 4.0–10.5)
nRBC: 0 % (ref 0.0–0.2)

## 2020-11-07 LAB — GLUCOSE, CAPILLARY
Glucose-Capillary: 126 mg/dL — ABNORMAL HIGH (ref 70–99)
Glucose-Capillary: 127 mg/dL — ABNORMAL HIGH (ref 70–99)
Glucose-Capillary: 135 mg/dL — ABNORMAL HIGH (ref 70–99)
Glucose-Capillary: 149 mg/dL — ABNORMAL HIGH (ref 70–99)

## 2020-11-07 LAB — OSMOLALITY: Osmolality: 273 mOsm/kg — ABNORMAL LOW (ref 275–295)

## 2020-11-07 LAB — URIC ACID: Uric Acid, Serum: 3.3 mg/dL — ABNORMAL LOW (ref 3.7–8.6)

## 2020-11-07 LAB — CORTISOL: Cortisol, Plasma: 33.4 ug/dL

## 2020-11-07 LAB — MAGNESIUM: Magnesium: 1.7 mg/dL (ref 1.7–2.4)

## 2020-11-07 LAB — OSMOLALITY, URINE: Osmolality, Ur: 285 mOsm/kg — ABNORMAL LOW (ref 300–900)

## 2020-11-07 LAB — TSH: TSH: 1.027 u[IU]/mL (ref 0.350–4.500)

## 2020-11-07 LAB — SODIUM, URINE, RANDOM
Sodium, Ur: 72 mmol/L
Sodium, Ur: 19 mmol/L

## 2020-11-07 LAB — AMMONIA: Ammonia: 40 umol/L — ABNORMAL HIGH (ref 9–35)

## 2020-11-07 LAB — BRAIN NATRIURETIC PEPTIDE: B Natriuretic Peptide: 270.4 pg/mL — ABNORMAL HIGH (ref 0.0–100.0)

## 2020-11-07 MED ORDER — POTASSIUM CHLORIDE CRYS ER 20 MEQ PO TBCR
40.0000 meq | EXTENDED_RELEASE_TABLET | Freq: Once | ORAL | Status: AC
  Administered 2020-11-07: 40 meq via ORAL
  Filled 2020-11-07: qty 2

## 2020-11-07 MED ORDER — KETOROLAC TROMETHAMINE 15 MG/ML IJ SOLN
15.0000 mg | Freq: Once | INTRAMUSCULAR | Status: AC | PRN
  Administered 2020-11-07: 15 mg via INTRAVENOUS
  Filled 2020-11-07: qty 1

## 2020-11-07 MED ORDER — METOPROLOL TARTRATE 5 MG/5ML IV SOLN
2.5000 mg | INTRAVENOUS | Status: AC
  Administered 2020-11-07: 2.5 mg via INTRAVENOUS
  Filled 2020-11-07: qty 5

## 2020-11-07 MED ORDER — KETOROLAC TROMETHAMINE 15 MG/ML IJ SOLN
15.0000 mg | Freq: Four times a day (QID) | INTRAMUSCULAR | Status: AC | PRN
Start: 2020-11-07 — End: 2020-11-08
  Administered 2020-11-07 – 2020-11-08 (×4): 15 mg via INTRAVENOUS
  Filled 2020-11-07 (×4): qty 1

## 2020-11-07 MED ORDER — SODIUM CHLORIDE 0.9 % IV SOLN: INTRAVENOUS | Status: DC

## 2020-11-07 MED ORDER — NALOXONE HCL 0.4 MG/ML IJ SOLN
0.4000 mg | INTRAMUSCULAR | Status: DC | PRN
  Administered 2020-11-07: 0.4 mg via INTRAVENOUS
  Filled 2020-11-07: qty 1

## 2020-11-07 MED ORDER — LORAZEPAM 2 MG/ML IJ SOLN
2.0000 mg | Freq: Once | INTRAMUSCULAR | Status: AC | PRN
  Administered 2020-11-07: 2 mg via INTRAVENOUS
  Filled 2020-11-07: qty 1

## 2020-11-07 MED ORDER — CARVEDILOL 12.5 MG PO TABS
12.5000 mg | ORAL_TABLET | Freq: Two times a day (BID) | ORAL | Status: DC
  Administered 2020-11-08 – 2020-11-10 (×6): 12.5 mg via ORAL
  Filled 2020-11-07 (×7): qty 1

## 2020-11-07 MED ORDER — LACTULOSE 10 GM/15ML PO SOLN
10.0000 g | Freq: Three times a day (TID) | ORAL | Status: DC
  Administered 2020-11-07 – 2020-11-18 (×32): 10 g via ORAL
  Filled 2020-11-07 (×31): qty 30

## 2020-11-07 MED ORDER — LORAZEPAM 2 MG/ML IJ SOLN: 2.0000 mg | Freq: Once | INTRAMUSCULAR | Status: DC

## 2020-11-07 MED ORDER — OXYCODONE HCL 5 MG PO TABS
5.0000 mg | ORAL_TABLET | Freq: Four times a day (QID) | ORAL | Status: DC | PRN
  Administered 2020-11-07 – 2020-11-09 (×7): 5 mg via ORAL
  Filled 2020-11-07 (×8): qty 1

## 2020-11-07 NOTE — Progress Notes (Signed)
Pt has been restless, constantly turning and getting out of the bed. Pt confused and tachycardic. Pt c/o pain to right leg. Pt stated that he uses cocaine at home at least two times a day. Pt stated he only uses a little bit of it. Pt stated he feels "antsy", he stated he thinks he maybe having some withdrawal. Pt also c/o pain to right leg, burning sensation. 0320  Provider on call paged to inform, received order for tordal for pain and ativan.

## 2020-11-07 NOTE — Progress Notes (Addendum)
[facility] Infectious Diseases Follow Up Note  Patient Identification: Patient Name: [name] MRN: [id number] Admit Date: 11/02/2020  2:54 PM Age: 55 y.o.Today's Date: 11/07/2020   Reason for Visit: discitis and osteomyelitis   Principal Problem:   Epidural abscess Active Problems:   Benign essential HTN   Antibiotics:Vancomycin 8/8-current                    Cefepime 8/8-current   Lines/Tubes: Pivs   Interval Events: Continues to remain afebrile, WBC approximately stable.    Assessment Encephalopathy: received ativan last night, also concern for withdrawal? , afebrile, leukocytosis is downtrending  Recurrent and advancing discitis and osteomyelitis of lumbar spine with epidural abscess/right psoas myositis -Status post L3-L4 laminectomies and evacuation of epidural abscess by neurosurgery on 8/7.  Operative cultures growing Pseudomonas aeruginosa, sensitive to ciprofloxacin  3.   history of TLIF L4-5 with hardware followed by  L1-L2 discitis and osteomyelitis s/p  IV abtx for 6 weeks ( May-June 2021) followed by recurrent/advancing T-L discitis and osteomyelitis and epidural phlegmon s/p IV Vancomycin+ cefepime>> oritavancin  and ciprofloxacin ( October-December 2021)  4.   Prior h/o left ankle infection a/w hardware ( removed) 5.   Rt TKA 6.    Polysubstance abuse    Recommendations - Ideally prefer to give him 6 weeks of IV cefepime given h/o recurrent vertebral infection with hardware. If  not possible given h/o IVDU, reasonable to a 2 week course of IV cefepime from 8/7 followed by Ciprofloxacin for 6 more weeks. Would recommend PO suppression with Ciprofloxacin after completion of the treatment course ( Can be done OP)  - Monitor CBC and BMP on IV abtx  - ESR and CRP weekly - Minimize IV narcotic/BZDs as possible given confusion with benzos - A follow up with [facility] will be made. Please call us back with  questions.  Plan discussed with patient/ID pharmacy and Primary  Rest of the management as per the primary team. Thank you for the consult. Please page with pertinent questions or concerns.  ______________________________________________________________________ Subjective patient seen and examined at the bedside. Lower back pain is stable.  Appears drowsy but arousable to voice. Denies fever, chills and sweats.  Denies nausea, vomiting and diarrhea  Vitals BP (!) 134/96 (BP Location: Right Arm)   Pulse (!) 106   Temp 98.7 F (37.1 C) (Axillary)   Resp 18   Ht 5\' 9"  (1.753 m)   Wt 79.1 kg   SpO2 100%   BMI 25.75 kg/m     Physical Exam Constitutional: Not in acute distress, lying in bed    Comments:   Cardiovascular:     Rate and Rhythm: Normal rate and regular rhythm.     Heart sounds:   Pulmonary:     Effort: Pulmonary effort is normal.     Comments: RA  Abdominal:     Palpations: Abdomen is soft.     Tenderness: Non tender and non distended   Musculoskeletal:        General: No swelling or tenderness. Lower back tenderness  Skin:    Comments: No lesions or rashes   Neurological:     General: No focal deficit present.   Psychiatric:        Mood and Affect: Drowsy but arousable to voice   Pertinent Microbiology Results for orders placed or performed during the hospital encounter of 11/02/20  Resp Panel by RT-PCR (Flu A&B, Covid) Urine, Unspecified Source     Status:  None   Collection Time: 11/02/20  4:13 PM   Specimen: Urine, Unspecified Source; Nasopharyngeal(NP) swabs in vial transport medium  Result Value Ref Range Status   SARS Coronavirus 2 by RT PCR NEGATIVE NEGATIVE Final    Comment: (NOTE) SARS-CoV-2 target nucleic acids are NOT DETECTED.  The SARS-CoV-2 RNA is generally detectable in upper respiratory specimens during the acute phase of infection. The lowest concentration of SARS-CoV-2 viral copies this assay can detect is 138 copies/mL. A  negative result does not preclude SARS-Cov-2 infection and should not be used as the sole basis for treatment or other patient management decisions. A negative result may occur with  improper specimen collection/handling, submission of specimen other than nasopharyngeal swab, presence of viral mutation(s) within the areas targeted by this assay, and inadequate number of viral copies(<138 copies/mL). A negative result must be combined with clinical observations, patient history, and epidemiological information. The expected result is Negative.  Fact Sheet for Patients:  EntrepreneurPulse.com.au  Fact Sheet for Healthcare Providers:  IncredibleEmployment.be  This test is no t yet approved or cleared by the [place] FDA and  has been authorized for detection and/or diagnosis of SARS-CoV-2 by FDA under an Emergency Use Authorization (EUA). This EUA will remain  in effect (meaning this test can be used) for the duration of the COVID-19 declaration under Section 564(b)(1) of the Act, 21 U.S.C.section 360bbb-3(b)(1), unless the authorization is terminated  or revoked sooner.       Influenza A by PCR NEGATIVE NEGATIVE Final   Influenza B by PCR NEGATIVE NEGATIVE Final    Comment: (NOTE) The Xpert Xpress SARS-CoV-2/FLU/RSV plus assay is intended as an aid in the diagnosis of influenza from Nasopharyngeal swab specimens and should not be used as a sole basis for treatment. Nasal washings and aspirates are unacceptable for Xpert Xpress SARS-CoV-2/FLU/RSV testing.  Fact Sheet for Patients: EntrepreneurPulse.com.au  Fact Sheet for Healthcare Providers: IncredibleEmployment.be  This test is not yet approved or cleared by the [place] FDA and has been authorized for detection and/or diagnosis of SARS-CoV-2 by FDA under an Emergency Use Authorization (EUA). This EUA will remain in effect (meaning this test can  be used) for the duration of the COVID-19 declaration under Section 564(b)(1) of the Act, 21 U.S.C. section 360bbb-3(b)(1), unless the authorization is terminated or revoked.  Performed at [facility], [place] [address], [place], [place] [address]   Blood culture (routine x 2)     Status: None (Preliminary result)   Collection Time: 11/02/20  8:00 PM   Specimen: BLOOD  Result Value Ref Range Status   Specimen Description   Final    BLOOD BLOOD RIGHT HAND Performed at [place] [address] [place], [place] [address]    Special Requests   Final    BOTTLES DRAWN AEROBIC AND ANAEROBIC Blood Culture adequate volume Performed at [place] [address], [place], [place] [address]    Culture   Final    NO GROWTH 4 DAYS Performed at [facility], [place] [address], [place], [place] [address]    Report Status PENDING  Incomplete  Blood culture (routine x 2)     Status: None (Preliminary result)   Collection Time: 11/02/20  8:10 PM   Specimen: BLOOD  Result Value Ref Range Status   Specimen Description   Final    BLOOD BLOOD LEFT HAND Performed at [place] [address] [place], [place] [address]  Special Requests   Final    BOTTLES DRAWN AEROBIC ONLY Blood Culture results may not be optimal due to an inadequate volume of blood received in culture bottles Performed at [place] [address], [place], [place] [address]    Culture   Final    NO GROWTH 4 DAYS Performed at [facility], [place] [address], [place], [place] [address]    Report Status PENDING  Incomplete  Surgical pcr screen     Status: None   Collection Time: 11/03/20  2:14 PM   Specimen: Nasal Mucosa; Nasal Swab  Result Value Ref Range Status   MRSA, PCR NEGATIVE NEGATIVE Final   Staphylococcus aureus NEGATIVE NEGATIVE Final    Comment: (NOTE) The Xpert SA Assay (FDA approved for NASAL  specimens in patients 66 years of age and older), is one component of a comprehensive surveillance program. It is not intended to diagnose infection nor to guide or monitor treatment. Performed at [facility], [place] [address] [place], [place] [address]   Aerobic/Anaerobic Culture w Gram Stain (surgical/deep wound)     Status: None (Preliminary result)   Collection Time: 11/03/20  3:49 PM   Specimen: Abscess  Result Value Ref Range Status   Specimen Description ABSCESS  Final   Special Requests LUMBAR EPIDURAL ABSCESS SPEC A  Final   Gram Stain   Final    RARE WBC PRESENT, PREDOMINANTLY PMN NO ORGANISMS SEEN Performed at [facility], [place] [address], [place], [place] [address]    Culture   Final    RARE PSEUDOMONAS AERUGINOSA NO ANAEROBES ISOLATED; CULTURE IN PROGRESS FOR 5 DAYS    Report Status PENDING  Incomplete   Organism ID, Bacteria PSEUDOMONAS AERUGINOSA  Final      Susceptibility   Pseudomonas aeruginosa - MIC*    CEFTAZIDIME 4 SENSITIVE Sensitive     CIPROFLOXACIN <=0.25 SENSITIVE Sensitive     GENTAMICIN <=1 SENSITIVE Sensitive     IMIPENEM 1 SENSITIVE Sensitive     PIP/TAZO <=4 SENSITIVE Sensitive     CEFEPIME 2 SENSITIVE Sensitive     * RARE PSEUDOMONAS AERUGINOSA   Pertinent Lab. CBC Latest Ref Rng & Units 11/07/2020 11/06/2020 11/05/2020  WBC 4.0 - 10.5 K/uL 14.5(H) 13.2(H) 14.9(H)  Hemoglobin 13.0 - 17.0 g/dL 10.8(L) 11.3(L) 10.5(L)  Hematocrit 39.0 - 52.0 % 32.2(L) 33.3(L) 31.9(L)  Platelets 150 - 400 K/uL 349 336 334   CMP Latest Ref Rng & Units 11/07/2020 11/06/2020 11/05/2020  Glucose 70 - 99 mg/dL 134(H) 122(H) 130(H)  BUN 6 - 20 mg/dL $Remove'11 10 13  'rigdpnH$ Creatinine 0.61 - 1.24 mg/dL 0.58(L) 0.57(L) 0.67  Sodium 135 - 145 mmol/L 125(L) 122(L) 126(L)  Potassium 3.5 - 5.1 mmol/L 3.3(L) 3.7 3.2(L)  Chloride 98 - 111 mmol/L 96(L) 94(L) 95(L)  CO2 22 - 32 mmol/L 18(L) 18(L) 21(L)  Calcium 8.9 - 10.3 mg/dL 8.5(L) 8.6(L) 8.5(L)  Total Protein 6.5 - 8.1 g/dL -  - -  Total Bilirubin 0.3 - 1.2 mg/dL - - -  Alkaline Phos 38 - 126 U/L - - -  AST 15 - 41 U/L - - -  ALT 0 - 44 U/L - - -     Pertinent Imaging today Plain films and CT images have been personally visualized and interpreted; radiology reports have been reviewed. Decision making incorporated into the Impression / Recommendations.  I spent more than 35 minutes for this patient encounter including review of prior medical records, coordination  of care  with greater than 50% of time being face to face/counseling and discussing diagnostics/treatment plan with the patient/family.  Electronically signed by:   Rosiland Oz, MD Infectious Disease Physician [facility] Infectious Disease Pager: [phone]

## 2020-11-07 NOTE — Progress Notes (Signed)
0630 patients significant other, [name], called and given an update regarding patients status. Provider informed that significant other would like an update from doctor today. [name] stated she is unable to get to hospital.

## 2020-11-07 NOTE — Progress Notes (Signed)
0600 provider paged to inform pts HR  120s-130s. Pt resting. Received new order for Lopressor 2.'5mg'$  IV x 1. Given per order. HR down to 110's-120s.

## 2020-11-07 NOTE — Progress Notes (Signed)
PROGRESS NOTE    [name]  NIO:[id number] DOB: April 25, 1965 DOA: 11/02/2020 PCP: [name], MD   Chief Complain: Back pain  Brief Narrative:  Patient is a 55 year old male with recurrent discitis , admitted on 2021 for lumbar discitis with phlegmon requiring prolonged antibiotic course admitted this time for lower back pain for 2 days.  MRI showed epidural abscess, right-sided psoas myositis.  Neurosurgery, ID consulted.  Underwent L3-L4 laminectomies with evacuation of epidural abscess.  No further surgery planned by neurosurgery.  Currently on cefepime as per ID. Assessment & Plan:   Principal Problem:   Epidural abscess Active Problems:   Benign essential HTN   L3-L4 discitis with osteomyelitis: Recurrent issue.  Presented with back pain.  MRI showed epidural abscess, right-sided psoas myositis.  Neurosurgery, ID consulted.  Underwent L3-L4 laminectomies with evacuation of epidural abscess.  No further surgery planned by neurosurgery.  Currently on cefepime as per ID.  Continue pain management, minimize IV narcotic use.  Cultures remain negative so far. Still has mild leukocytosis but afebrile. Eventually will require PICC line , may not be able to discharge with long-term IV antibiotics to home because of his drug issues.  Electrolyte abnormalities: Continue to monitor and supplement.  Potassium supplemented.  Sodium at 125.  Urine sodium of 72.  Will check SIADH panel.  Hypertension: Monitor blood pressure.  Continue current medications.  Prediabetes: Blood sugars well controlled.  Hemoglobin A1c 5.9.  Continue sliding scale insulin.  History of schizophrenia: On Cogentin, Wellbutrin, gabapentin at home these medications are on hold because of increased confusion today.  Altered mental status: Looks lethargic, confused today.  Patient was given 2 mg of Ativan last night.  ABG did not show any hypercarbia.  Will to minimize IV narcotics, benzodiazepines.  We will give him  a dose of  Narcan.  History of COPD: Continue as needed bronchodilators.  Currently on room air.  History of alcohol use/cocaine use: UDS positive for cocaine, opiates.  He was on Librium taper.         DVT prophylaxis:SCD Code Status: Full Family Communication: Called and discussed with the significant other on phone Status is: Inpatient  Remains inpatient appropriate because:Inpatient level of care appropriate due to severity of illness  Dispo: The patient is from: Home              Anticipated d/c is to: Home              Patient currently is not medically stable to d/c.   Difficult to place patient No     Consultants: ID,neurosurgery  Procedures:As above  Antimicrobials:  Anti-infectives (From admission, onward)    Start     Dose/Rate Route Frequency Ordered Stop   11/04/20 1800  vancomycin (VANCOREADY) IVPB 750 mg/150 mL  Status:  Discontinued        750 mg 150 mL/hr over 60 Minutes Intravenous Every 8 hours 11/04/20 0911 11/06/20 0836   11/04/20 1000  vancomycin (VANCOREADY) IVPB 2000 mg/400 mL        2,000 mg 200 mL/hr over 120 Minutes Intravenous  Once 11/04/20 0905 11/04/20 1237   11/04/20 1000  ceFEPIme (MAXIPIME) 2 g in sodium chloride 0.9 % 100 mL IVPB        2 g 200 mL/hr over 30 Minutes Intravenous Every 8 hours 11/04/20 0905     11/03/20 1457  ceFAZolin (ANCEF) 2-4 GM/100ML-% IVPB       Note to Pharmacy: Samuella Cota   :  cabinet override      11/03/20 1457 11/04/20 0259       Subjective:  Patient seen and examined at bedside this morning.  Hemodynamically stable but he was very lethargic and unable to speak.  Felt asleep instantly.  Was given Ativan last night.  Had long discussion with the significant other on phone.  Objective: Vitals:   11/06/20 2036 11/06/20 2320 11/07/20 0352 11/07/20 0720  BP: (!) 156/89 (!) 149/98 (!) 160/104 (!) 117/95  Pulse: 99 (!) 101 86 (!) 117  Resp: 18 19 19 18   Temp: 98.9 F (37.2 C) 98.9 F (37.2 C) 99.4  F (37.4 C)   TempSrc: Axillary Oral Oral   SpO2: 100% 100% 99% 97%  Weight:      Height:        Intake/Output Summary (Last 24 hours) at 11/07/2020 0753 Last data filed at 11/07/2020 0500 Gross per 24 hour  Intake 1832.13 ml  Output 2250 ml  Net -417.87 ml   Filed Weights   11/04/20 0356 11/05/20 0330 11/06/20 0336  Weight: 78.4 kg 78.2 kg 79.1 kg    Examination:  General exam: sleepy,lethargic HEENT: PERRL Respiratory system:  no wheezes or crackles  Cardiovascular system: S1 & S2 heard, RRR.  Gastrointestinal system: Abdomen is nondistended, soft and nontender. Central nervous system: Not Alert and oriented Extremities: No edema, no clubbing ,no cyanosis Skin: No rashes, no ulcers,no icterus      Data Reviewed: I have personally reviewed following labs and imaging studies  CBC: Recent Labs  Lab 11/02/20 1612 11/04/20 0228 11/05/20 0217 11/06/20 0146 11/07/20 0119  WBC 21.0* 16.7* 14.9* 13.2* 14.5*  NEUTROABS 19.3*  --   --   --   --   HGB 11.9* 11.2* 10.5* 11.3* 10.8*  HCT 35.6* 33.2* 31.9* 33.3* 32.2*  MCV 83.4 82.2 83.1 82.2 82.4  PLT 326 347 334 336 349   Basic Metabolic Panel: Recent Labs  Lab 11/02/20 1612 11/04/20 0228 11/05/20 0217 11/06/20 0146 11/07/20 0119  NA 131* 131* 126* 122* 125*  K 3.5 3.4* 3.2* 3.7 3.3*  CL 95* 98 95* 94* 96*  CO2 25 22 21* 18* 18*  GLUCOSE 133* 127* 130* 122* 134*  BUN 8 13 13 10 11   CREATININE 0.71 0.84 0.67 0.57* 0.58*  CALCIUM 9.3 8.9 8.5* 8.6* 8.5*  MG  --  2.0 1.7 1.7 1.7   GFR: Estimated Creatinine Clearance: 104.3 mL/min (A) (by C-G formula based on SCr of 0.58 mg/dL (L)). Liver Function Tests: Recent Labs  Lab 11/02/20 1612  AST 34  ALT 12  ALKPHOS 103  BILITOT 0.5  PROT 7.7  ALBUMIN 3.8   No results for input(s): LIPASE, AMYLASE in the last 168 hours. No results for input(s): AMMONIA in the last 168 hours. Coagulation Profile: No results for input(s): INR, PROTIME in the last 168  hours. Cardiac Enzymes: No results for input(s): CKTOTAL, CKMB, CKMBINDEX, TROPONINI in the last 168 hours. BNP (last 3 results) No results for input(s): PROBNP in the last 8760 hours. HbA1C: No results for input(s): HGBA1C in the last 72 hours. CBG: Recent Labs  Lab 11/06/20 0609 11/06/20 1126 11/06/20 1608 11/06/20 2126 11/07/20 0611  GLUCAP 124* 143* 157* 125* 126*   Lipid Profile: No results for input(s): CHOL, HDL, LDLCALC, TRIG, CHOLHDL, LDLDIRECT in the last 72 hours. Thyroid Function Tests: No results for input(s): TSH, T4TOTAL, FREET4, T3FREE, THYROIDAB in the last 72 hours. Anemia Panel: No results for input(s): VITAMINB12, FOLATE, FERRITIN, TIBC,  IRON, RETICCTPCT in the last 72 hours. Sepsis Labs: Recent Labs  Lab 11/02/20 2000  LATICACIDVEN 1.6    Recent Results (from the past 240 hour(s))  Resp Panel by RT-PCR (Flu A&B, Covid) Urine, Unspecified Source     Status: None   Collection Time: 11/02/20  4:13 PM   Specimen: Urine, Unspecified Source; Nasopharyngeal(NP) swabs in vial transport medium  Result Value Ref Range Status   SARS Coronavirus 2 by RT PCR NEGATIVE NEGATIVE Final    Comment: (NOTE) SARS-CoV-2 target nucleic acids are NOT DETECTED.  The SARS-CoV-2 RNA is generally detectable in upper respiratory specimens during the acute phase of infection. The lowest concentration of SARS-CoV-2 viral copies this assay can detect is 138 copies/mL. A negative result does not preclude SARS-Cov-2 infection and should not be used as the sole basis for treatment or other patient management decisions. A negative result may occur with  improper specimen collection/handling, submission of specimen other than nasopharyngeal swab, presence of viral mutation(s) within the areas targeted by this assay, and inadequate number of viral copies(<138 copies/mL). A negative result must be combined with clinical observations, patient history, and epidemiological information.  The expected result is Negative.  Fact Sheet for Patients:  BloggerCourse.com  Fact Sheet for Healthcare Providers:  SeriousBroker.it  This test is no t yet approved or cleared by the [place] FDA and  has been authorized for detection and/or diagnosis of SARS-CoV-2 by FDA under an Emergency Use Authorization (EUA). This EUA will remain  in effect (meaning this test can be used) for the duration of the COVID-19 declaration under Section 564(b)(1) of the Act, 21 U.S.C.section 360bbb-3(b)(1), unless the authorization is terminated  or revoked sooner.       Influenza A by PCR NEGATIVE NEGATIVE Final   Influenza B by PCR NEGATIVE NEGATIVE Final    Comment: (NOTE) The Xpert Xpress SARS-CoV-2/FLU/RSV plus assay is intended as an aid in the diagnosis of influenza from Nasopharyngeal swab specimens and should not be used as a sole basis for treatment. Nasal washings and aspirates are unacceptable for Xpert Xpress SARS-CoV-2/FLU/RSV testing.  Fact Sheet for Patients: BloggerCourse.com  Fact Sheet for Healthcare Providers: SeriousBroker.it  This test is not yet approved or cleared by the [place] FDA and has been authorized for detection and/or diagnosis of SARS-CoV-2 by FDA under an Emergency Use Authorization (EUA). This EUA will remain in effect (meaning this test can be used) for the duration of the COVID-19 declaration under Section 564(b)(1) of the Act, 21 U.S.C. section 360bbb-3(b)(1), unless the authorization is terminated or revoked.  Performed at [facility], [address] [place], [place] [address]   Blood culture (routine x 2)     Status: None (Preliminary result)   Collection Time: 11/02/20  8:00 PM   Specimen: BLOOD  Result Value Ref Range Status   Specimen Description   Final    BLOOD BLOOD RIGHT HAND Performed at [facility], [address] [place], [place] [address]    Special Requests   Final    BOTTLES DRAWN AEROBIC AND ANAEROBIC Blood Culture adequate volume Performed at [facility], [address] [place], [place] [address]    Culture   Final    NO GROWTH 3 DAYS Performed at [facility], [address], [place], [place] [address]    Report Status PENDING  Incomplete  Blood culture (routine x 2)     Status: None (Preliminary result)  Collection Time: 11/02/20  8:10 PM   Specimen: BLOOD  Result Value Ref Range Status   Specimen Description   Final    BLOOD BLOOD LEFT HAND Performed at [facility], [address] [place], [place] [address]    Special Requests   Final    BOTTLES DRAWN AEROBIC ONLY Blood Culture results may not be optimal due to an inadequate volume of blood received in culture bottles Performed at [facility], [address], [place], [place] [address]    Culture   Final    NO GROWTH 3 DAYS Performed at [facility], [address], [place], [place] [address]    Report Status PENDING  Incomplete  Surgical pcr screen     Status: None   Collection Time: 11/03/20  2:14 PM   Specimen: Nasal Mucosa; Nasal Swab  Result Value Ref Range Status   MRSA, PCR NEGATIVE NEGATIVE Final   Staphylococcus aureus NEGATIVE NEGATIVE Final    Comment: (NOTE) The Xpert SA Assay (FDA approved for NASAL specimens in patients 28 years of age and older), is one component of a comprehensive surveillance program. It is not intended to diagnose infection nor to guide or monitor treatment. Performed at [facility], [address], [place], [place] [address]   Aerobic/Anaerobic Culture w Gram Stain (surgical/deep wound)     Status: None (Preliminary result)   Collection Time: 11/03/20  3:49 PM   Specimen: Abscess  Result Value Ref Range Status   Specimen Description ABSCESS  Final   Special Requests  LUMBAR EPIDURAL ABSCESS SPEC A  Final   Gram Stain   Final    RARE WBC PRESENT, PREDOMINANTLY PMN NO ORGANISMS SEEN Performed at [facility], [address], [place], [place] [address]    Culture   Final    RARE PSEUDOMONAS AERUGINOSA SUSCEPTIBILITIES TO FOLLOW NO ANAEROBES ISOLATED; CULTURE IN PROGRESS FOR 5 DAYS    Report Status PENDING  Incomplete         Radiology Studies: No results found.      Scheduled Meds:  benztropine  1 mg Oral Daily   buPROPion  300 mg Oral Daily   chlordiazePOXIDE  10 mg Oral Once   ferrous sulfate  325 mg Oral Q breakfast   insulin aspart  0-9 Units Subcutaneous TID WC   lidocaine  1 patch Transdermal Q24H   melatonin  10 mg Oral QHS   metoprolol succinate  50 mg Oral Daily   mirtazapine  15 mg Oral QHS   nicotine  14 mg Transdermal Daily   pantoprazole  40 mg Oral Daily   sucralfate  1 g Oral TID WC & HS   Continuous Infusions:  sodium chloride 75 mL/hr at 11/06/20 0901   ceFEPime (MAXIPIME) IV 2 g (11/07/20 0200)     LOS: 4 days    Time spent:25 mins,. More than 50% of that time was spent in counseling and/or coordination of care.      Burnadette Pop, MD [facility] P8/01/2021, 7:53 AM

## 2020-11-07 NOTE — Plan of Care (Signed)
  Problem: Education: Goal: Knowledge of General Education information will improve Description: Including pain rating scale, medication(s)/side effects and non-pharmacologic comfort measures Outcome: Progressing   Problem: Health Behavior/Discharge Planning: Goal: Ability to manage health-related needs will improve Outcome: Progressing   Problem: Clinical Measurements: Goal: Ability to maintain clinical measurements within normal limits will improve Outcome: Progressing Goal: Will remain free from infection Outcome: Progressing Goal: Diagnostic test results will improve Outcome: Progressing Goal: Respiratory complications will improve Outcome: Progressing Goal: Cardiovascular complication will be avoided Outcome: Progressing   Problem: Activity: Goal: Risk for activity intolerance will decrease Outcome: Progressing   Problem: Nutrition: Goal: Adequate nutrition will be maintained Outcome: Progressing   Problem: Coping: Goal: Level of anxiety will decrease Outcome: Progressing   Problem: Coping: Goal: Level of anxiety will decrease Outcome: Progressing   Problem: Elimination: Goal: Will not experience complications related to bowel motility Outcome: Progressing Goal: Will not experience complications related to urinary retention Outcome: Progressing   Problem: Pain Managment: Goal: General experience of comfort will improve Outcome: Progressing   Problem: Safety: Goal: Ability to remain free from injury will improve Outcome: Progressing   Problem: Skin Integrity: Goal: Risk for impaired skin integrity will decrease Outcome: Progressing   Problem: Education: Goal: Ability to verbalize activity precautions or restrictions will improve Outcome: Progressing Goal: Knowledge of the prescribed therapeutic regimen will improve Outcome: Progressing Goal: Understanding of discharge needs will improve Outcome: Progressing   Problem: Activity: Goal: Ability to  avoid complications of mobility impairment will improve Outcome: Progressing Goal: Ability to tolerate increased activity will improve Outcome: Progressing Goal: Will remain free from falls Outcome: Progressing   Problem: Bowel/Gastric: Goal: Gastrointestinal status for postoperative course will improve Outcome: Progressing   Problem: Clinical Measurements: Goal: Ability to maintain clinical measurements within normal limits will improve Outcome: Progressing Goal: Postoperative complications will be avoided or minimized Outcome: Progressing Goal: Diagnostic test results will improve Outcome: Progressing   Problem: Skin Integrity: Goal: Will show signs of wound healing Outcome: Progressing   Problem: Skin Integrity: Goal: Will show signs of wound healing Outcome: Progressing

## 2020-11-08 LAB — CBC WITH DIFFERENTIAL/PLATELET
Abs Immature Granulocytes: 0.09 K/uL — ABNORMAL HIGH (ref 0.00–0.07)
Basophils Absolute: 0 K/uL (ref 0.0–0.1)
Basophils Relative: 0 %
Eosinophils Absolute: 0.1 K/uL (ref 0.0–0.5)
Eosinophils Relative: 1 %
HCT: 30.2 % — ABNORMAL LOW (ref 39.0–52.0)
Hemoglobin: 9.8 g/dL — ABNORMAL LOW (ref 13.0–17.0)
Immature Granulocytes: 1 %
Lymphocytes Relative: 8 %
Lymphs Abs: 1.1 K/uL (ref 0.7–4.0)
MCH: 27.3 pg (ref 26.0–34.0)
MCHC: 32.5 g/dL (ref 30.0–36.0)
MCV: 84.1 fL (ref 80.0–100.0)
Monocytes Absolute: 1.1 K/uL — ABNORMAL HIGH (ref 0.1–1.0)
Monocytes Relative: 8 %
Neutro Abs: 11.2 K/uL — ABNORMAL HIGH (ref 1.7–7.7)
Neutrophils Relative %: 82 %
Platelets: 400 K/uL (ref 150–400)
RBC: 3.59 MIL/uL — ABNORMAL LOW (ref 4.22–5.81)
RDW: 14.5 % (ref 11.5–15.5)
WBC: 13.5 K/uL — ABNORMAL HIGH (ref 4.0–10.5)
nRBC: 0 % (ref 0.0–0.2)

## 2020-11-08 LAB — GLUCOSE, CAPILLARY
Glucose-Capillary: 120 mg/dL — ABNORMAL HIGH (ref 70–99)
Glucose-Capillary: 124 mg/dL — ABNORMAL HIGH (ref 70–99)
Glucose-Capillary: 109 mg/dL — ABNORMAL HIGH (ref 70–99)

## 2020-11-08 LAB — BASIC METABOLIC PANEL
Anion gap: 8 (ref 5–15)
BUN: 15 mg/dL (ref 6–20)
CO2: 17 mmol/L — ABNORMAL LOW (ref 22–32)
Calcium: 8.3 mg/dL — ABNORMAL LOW (ref 8.9–10.3)
Chloride: 104 mmol/L (ref 98–111)
Creatinine, Ser: 0.65 mg/dL (ref 0.61–1.24)
GFR, Estimated: >60 mL/min (ref >60)
Glucose, Bld: 108 mg/dL — ABNORMAL HIGH (ref 70–99)
Potassium: 3.4 mmol/L — ABNORMAL LOW (ref 3.5–5.1)
Sodium: 129 mmol/L — ABNORMAL LOW (ref 135–145)

## 2020-11-08 LAB — CULTURE, BLOOD (ROUTINE X 2)
Culture: NO GROWTH
Culture: NO GROWTH
Special Requests: ADEQUATE

## 2020-11-08 LAB — AEROBIC/ANAEROBIC CULTURE W GRAM STAIN (SURGICAL/DEEP WOUND)

## 2020-11-08 LAB — MAGNESIUM: Magnesium: 1.9 mg/dL (ref 1.7–2.4)

## 2020-11-08 MED ORDER — AMLODIPINE BESYLATE 10 MG PO TABS
10.0000 mg | ORAL_TABLET | Freq: Every day | ORAL | Status: DC
  Administered 2020-11-08 – 2020-11-18 (×11): 10 mg via ORAL
  Filled 2020-11-08 (×11): qty 1

## 2020-11-08 NOTE — Care Management Important Message (Signed)
Important Message  Patient Details  Name: [name] MRN: [id number] Date of Birth: 1965-08-04   Medicare Important Message Given:  Yes     [name] [address] 11/08/2020, 9:23 AM

## 2020-11-08 NOTE — Progress Notes (Signed)
PROGRESS NOTE    [name]  ZOX:[id number] DOB: 01/29/1966 DOA: 11/02/2020 PCP: [name], MD   Chief Complain: Back pain  Brief Narrative:  Patient is a 55 year old male with recurrent discitis , admitted on 2021 for lumbar discitis with phlegmon requiring prolonged antibiotic course admitted this time for lower back pain for 2 days.  MRI showed epidural abscess, right-sided psoas myositis.  Neurosurgery, ID consulted.  Underwent L3-L4 laminectomies with evacuation of epidural abscess.  No further surgery planned by neurosurgery.  Currently on cefepime as per ID.  Plan for total of 2 weeks( from 8/7) of cefepime followed by ciprofloxacin for 6 weeks.  He needs to be in hospital for IV antibiotics because of history of drug abuse.  Assessment & Plan:   Principal Problem:   Epidural abscess Active Problems:   Benign essential HTN   L3-L4 discitis with osteomyelitis: Recurrent issue.  Presented with back pain.  MRI showed epidural abscess, right-sided psoas myositis.  Neurosurgery, ID consulted.  Underwent L3-L4 laminectomies with evacuation of epidural abscess.  No further surgery planned by neurosurgery.  Currently on cefepime as per ID.  Continue pain management, minimize IV narcotic use. Blood  Cultures remain negative so far.  Epidural abscess cultures showed Pseudomonas, sensitive to Cipro.   Plan for total of 2 weeks( from 8/7) of cefepime followed by ciprofloxacin for 6 weeks.  He needs to be in hospital for IV antibiotics because of history of drug abuse.   Electrolyte abnormalities:  Sodium at 129.  Urine sodium less than 20.  Hyponatremia could be associated  with dehydration.  Continue gentle IV fluids.Check bmp tomorrow  Hypertension: Hypertensive today, on carvedilol.  Added amlodipine  Prediabetes: Blood sugars well controlled.  Hemoglobin A1c 5.9.  Continue sliding scale insulin.  History of schizophrenia: On Cogentin, Wellbutrin, gabapentin at home these  medications are on hold because of  confusion.  Altered mental status: resolved.  Alert and oriented today  History of COPD: Continue as needed bronchodilators.  Currently on room air.  History of alcohol use/cocaine use: UDS positive for cocaine, opiates.  He was on Librium taper.         DVT prophylaxis:SCD Code Status: Full Family Communication: Called and discussed with the significant other on phone on 11/08/10 Status is: Inpatient  Remains inpatient appropriate because:Inpatient level of care appropriate due to severity of illness  Dispo: The patient is from: Home              Anticipated d/c is to: Home              Patient currently is not medically stable to d/c.   Difficult to place patient No     Consultants: ID,neurosurgery  Procedures:As above  Antimicrobials:  Anti-infectives (From admission, onward)    Start     Dose/Rate Route Frequency Ordered Stop   11/04/20 1800  vancomycin (VANCOREADY) IVPB 750 mg/150 mL  Status:  Discontinued        750 mg 150 mL/hr over 60 Minutes Intravenous Every 8 hours 11/04/20 0911 11/06/20 0836   11/04/20 1000  vancomycin (VANCOREADY) IVPB 2000 mg/400 mL        2,000 mg 200 mL/hr over 120 Minutes Intravenous  Once 11/04/20 0905 11/04/20 1237   11/04/20 1000  ceFEPIme (MAXIPIME) 2 g in sodium chloride 0.9 % 100 mL IVPB        2 g 200 mL/hr over 30 Minutes Intravenous Every 8 hours 11/04/20 0905  11/03/20 1457  ceFAZolin (ANCEF) 2-4 GM/100ML-% IVPB       Note to Pharmacy: Samuella Cota   : cabinet override      11/03/20 1457 11/04/20 0259       Subjective:  Patient seen and examined at bedside this morning.  Hemodynamically stable.  Comfortable today.  Alert and oriented.  Back pain well controlled  Objective: Vitals:   11/07/20 2016 11/07/20 2340 11/08/20 0405 11/08/20 0743  BP: (!) 151/105 (!) 151/101 (!) 146/93 (!) 161/98  Pulse: (!) 102 98 91 86  Resp: 18 19 16 18   Temp: 99.1 F (37.3 C) 98 F (36.7 C)  98.1 F (36.7 C) (!) 97.5 F (36.4 C)  TempSrc: Oral Oral Oral Oral  SpO2: 98% 99% 98% 100%  Weight:      Height:        Intake/Output Summary (Last 24 hours) at 11/08/2020 0747 Last data filed at 11/08/2020 1610 Gross per 24 hour  Intake 2544.3 ml  Output 1650 ml  Net 894.3 ml   Filed Weights   11/04/20 0356 11/05/20 0330 11/06/20 0336  Weight: 78.4 kg 78.2 kg 79.1 kg    Examination:  General exam: Overall comfortable, not in distress HEENT: PERRL Respiratory system:  no wheezes or crackles  Cardiovascular system: S1 & S2 heard, RRR.  Gastrointestinal system: Abdomen is nondistended, soft and nontender. Central nervous system: Alert and oriented Extremities: No edema, no clubbing ,no cyanosis Skin: No rashes, no ulcers,no icterus , epidural abscess incision site is clean    Data Reviewed: I have personally reviewed following labs and imaging studies  CBC: Recent Labs  Lab 11/02/20 1612 11/04/20 0228 11/05/20 0217 11/06/20 0146 11/07/20 0119 11/08/20 0352  WBC 21.0* 16.7* 14.9* 13.2* 14.5* 13.5*  NEUTROABS 19.3*  --   --   --   --  11.2*  HGB 11.9* 11.2* 10.5* 11.3* 10.8* 9.8*  HCT 35.6* 33.2* 31.9* 33.3* 32.2* 30.2*  MCV 83.4 82.2 83.1 82.2 82.4 84.1  PLT 326 347 334 336 349 400   Basic Metabolic Panel: Recent Labs  Lab 11/04/20 0228 11/05/20 0217 11/06/20 0146 11/07/20 0119 11/08/20 0352  NA 131* 126* 122* 125* 129*  K 3.4* 3.2* 3.7 3.3* 3.4*  CL 98 95* 94* 96* 104  CO2 22 21* 18* 18* 17*  GLUCOSE 127* 130* 122* 134* 108*  BUN 13 13 10 11 15   CREATININE 0.84 0.67 0.57* 0.58* 0.65  CALCIUM 8.9 8.5* 8.6* 8.5* 8.3*  MG 2.0 1.7 1.7 1.7 1.9   GFR: Estimated Creatinine Clearance: 104.3 mL/min (by C-G formula based on SCr of 0.65 mg/dL). Liver Function Tests: Recent Labs  Lab 11/02/20 1612  AST 34  ALT 12  ALKPHOS 103  BILITOT 0.5  PROT 7.7  ALBUMIN 3.8   No results for input(s): LIPASE, AMYLASE in the last 168 hours. Recent Labs  Lab  11/07/20 1134  AMMONIA 40*   Coagulation Profile: No results for input(s): INR, PROTIME in the last 168 hours. Cardiac Enzymes: No results for input(s): CKTOTAL, CKMB, CKMBINDEX, TROPONINI in the last 168 hours. BNP (last 3 results) No results for input(s): PROBNP in the last 8760 hours. HbA1C: No results for input(s): HGBA1C in the last 72 hours. CBG: Recent Labs  Lab 11/07/20 0611 11/07/20 1229 11/07/20 1721 11/07/20 2124 11/08/20 0601  GLUCAP 126* 127* 135* 149* 120*   Lipid Profile: No results for input(s): CHOL, HDL, LDLCALC, TRIG, CHOLHDL, LDLDIRECT in the last 72 hours. Thyroid Function Tests: Recent Labs  11/07/20 1134  TSH 1.027   Anemia Panel: No results for input(s): VITAMINB12, FOLATE, FERRITIN, TIBC, IRON, RETICCTPCT in the last 72 hours. Sepsis Labs: Recent Labs  Lab 11/02/20 2000  LATICACIDVEN 1.6    Recent Results (from the past 240 hour(s))  Resp Panel by RT-PCR (Flu A&B, Covid) Urine, Unspecified Source     Status: None   Collection Time: 11/02/20  4:13 PM   Specimen: Urine, Unspecified Source; Nasopharyngeal(NP) swabs in vial transport medium  Result Value Ref Range Status   SARS Coronavirus 2 by RT PCR NEGATIVE NEGATIVE Final    Comment: (NOTE) SARS-CoV-2 target nucleic acids are NOT DETECTED.  The SARS-CoV-2 RNA is generally detectable in upper respiratory specimens during the acute phase of infection. The lowest concentration of SARS-CoV-2 viral copies this assay can detect is 138 copies/mL. A negative result does not preclude SARS-Cov-2 infection and should not be used as the sole basis for treatment or other patient management decisions. A negative result may occur with  improper specimen collection/handling, submission of specimen other than nasopharyngeal swab, presence of viral mutation(s) within the areas targeted by this assay, and inadequate number of viral copies(<138 copies/mL). A negative result must be combined  with clinical observations, patient history, and epidemiological information. The expected result is Negative.  Fact Sheet for Patients:  BloggerCourse.com  Fact Sheet for Healthcare Providers:  SeriousBroker.it  This test is no t yet approved or cleared by the [place] FDA and  has been authorized for detection and/or diagnosis of SARS-CoV-2 by FDA under an Emergency Use Authorization (EUA). This EUA will remain  in effect (meaning this test can be used) for the duration of the COVID-19 declaration under Section 564(b)(1) of the Act, 21 U.S.C.section 360bbb-3(b)(1), unless the authorization is terminated  or revoked sooner.       Influenza A by PCR NEGATIVE NEGATIVE Final   Influenza B by PCR NEGATIVE NEGATIVE Final    Comment: (NOTE) The Xpert Xpress SARS-CoV-2/FLU/RSV plus assay is intended as an aid in the diagnosis of influenza from Nasopharyngeal swab specimens and should not be used as a sole basis for treatment. Nasal washings and aspirates are unacceptable for Xpert Xpress SARS-CoV-2/FLU/RSV testing.  Fact Sheet for Patients: BloggerCourse.com  Fact Sheet for Healthcare Providers: SeriousBroker.it  This test is not yet approved or cleared by the [place] FDA and has been authorized for detection and/or diagnosis of SARS-CoV-2 by FDA under an Emergency Use Authorization (EUA). This EUA will remain in effect (meaning this test can be used) for the duration of the COVID-19 declaration under Section 564(b)(1) of the Act, 21 U.S.C. section 360bbb-3(b)(1), unless the authorization is terminated or revoked.  Performed at [facility], [address] [place], [place] [address]   Blood culture (routine x 2)     Status: None (Preliminary result)   Collection Time: 11/02/20  8:00 PM   Specimen: BLOOD  Result Value Ref Range Status   Specimen  Description   Final    BLOOD BLOOD RIGHT HAND Performed at [facility], [address], [place], [place] [address]    Special Requests   Final    BOTTLES DRAWN AEROBIC AND ANAEROBIC Blood Culture adequate volume Performed at [facility], [address] [place], [place] [address]    Culture   Final    NO GROWTH 4 DAYS Performed at [facility], [address] [place], [place] [address]    Report Status PENDING  Incomplete  Blood culture (routine x 2)     Status: None (Preliminary result)   Collection Time: 11/02/20  8:10 PM   Specimen: BLOOD  Result Value Ref Range Status   Specimen Description   Final    BLOOD BLOOD LEFT HAND Performed at [facility], [address], [place], [place] [address]    Special Requests   Final    BOTTLES DRAWN AEROBIC ONLY Blood Culture results may not be optimal due to an inadequate volume of blood received in culture bottles Performed at [facility], [address], [place], [place] [address]    Culture   Final    NO GROWTH 4 DAYS Performed at [facility], [address] [place], [place] [address]    Report Status PENDING  Incomplete  Surgical pcr screen     Status: None   Collection Time: 11/03/20  2:14 PM   Specimen: Nasal Mucosa; Nasal Swab  Result Value Ref Range Status   MRSA, PCR NEGATIVE NEGATIVE Final   Staphylococcus aureus NEGATIVE NEGATIVE Final    Comment: (NOTE) The Xpert SA Assay (FDA approved for NASAL specimens in patients 39 years of age and older), is one component of a comprehensive surveillance program. It is not intended to diagnose infection nor to guide or monitor treatment. Performed at [facility], [address], [place], [place] [address]   Aerobic/Anaerobic Culture w Gram Stain (surgical/deep wound)     Status: None (Preliminary result)   Collection Time: 11/03/20  3:49 PM   Specimen: Abscess  Result Value Ref  Range Status   Specimen Description ABSCESS  Final   Special Requests LUMBAR EPIDURAL ABSCESS SPEC A  Final   Gram Stain   Final    RARE WBC PRESENT, PREDOMINANTLY PMN NO ORGANISMS SEEN Performed at [facility], [address] [place], [place] [address]    Culture   Final    RARE PSEUDOMONAS AERUGINOSA NO ANAEROBES ISOLATED; CULTURE IN PROGRESS FOR 5 DAYS    Report Status PENDING  Incomplete   Organism ID, Bacteria PSEUDOMONAS AERUGINOSA  Final      Susceptibility   Pseudomonas aeruginosa - MIC*    CEFTAZIDIME 4 SENSITIVE Sensitive     CIPROFLOXACIN <=0.25 SENSITIVE Sensitive     GENTAMICIN <=1 SENSITIVE Sensitive     IMIPENEM 1 SENSITIVE Sensitive     PIP/TAZO <=4 SENSITIVE Sensitive     CEFEPIME 2 SENSITIVE Sensitive     * RARE PSEUDOMONAS AERUGINOSA         Radiology Studies: No results found.      Scheduled Meds:  carvedilol  12.5 mg Oral BID WC   ferrous sulfate  325 mg Oral Q breakfast   insulin aspart  0-9 Units Subcutaneous TID WC   lactulose  10 g Oral TID   lidocaine  1 patch Transdermal Q24H   melatonin  10 mg Oral QHS   nicotine  14 mg Transdermal Daily   pantoprazole  40 mg Oral Daily   sucralfate  1 g Oral TID WC & HS   Continuous Infusions:  sodium chloride 100 mL/hr at 11/08/20 0610   ceFEPime (MAXIPIME) IV 2 g (11/08/20 0202)     LOS: 5 days    Time spent:25 mins,. More than 50% of that time was spent in counseling and/or coordination of care.      Burnadette Pop, MD [facility] P8/02/2021, 7:47 AM

## 2020-11-08 NOTE — TOC Initial Note (Signed)
Transition of Care [facility]) - Initial/Assessment Note    Patient Details  Name: [name] MRN: [id number] Date of Birth: [birth year]  [facility]) CM/SW Contact:    Kermit Balo, RN Phone Number: 11/08/2020, 11:48 AM  Clinical Narrative:                 Patient is from home with his girlfriend.  PCP: [employer] uses [employer] for transportation. Pt states either he or girlfriend do his medications.  TOC following.  Expected Discharge Plan: Home/Self Care Barriers to Discharge: Continued Medical Work up   Patient Goals and CMS Choice        Expected Discharge Plan and Services Expected Discharge Plan: Home/Self Care   Discharge Planning Services: CM Consult   Living arrangements for the past 2 months: Apartment                                      Prior Living Arrangements/Services Living arrangements for the past 2 months: Apartment Lives with:: Significant Other Patient language and need for interpreter reviewed:: Yes Do you feel safe going back to the place where you live?: Yes          Current home services: DME (cane/ walker) Criminal Activity/Legal Involvement Pertinent to Current Situation/Hospitalization: No - Comment as needed  Activities of Daily Living      Permission Sought/Granted                  Emotional Assessment Appearance:: Appears stated age Attitude/Demeanor/Rapport: Engaged Affect (typically observed): Accepting Orientation: : Oriented to Self, Oriented to Place, Oriented to  Time, Oriented to Situation Alcohol / Substance Use: Illicit Drugs Psych Involvement: No (comment)  Admission diagnosis:  Discitis of lumbar region [M46.46] Epidural abscess [G06.2] Osteomyelitis of lumbar spine (HCC) [M46.26] Patient Active Problem List   Diagnosis Date Noted   Epidural abscess 11/03/2020   Ankle ankylosis, left    Closed fracture of one rib of left side    Discitis of lumbar region    Osteomyelitis (HCC)  01/07/2020   Acute osteomyelitis of lumbar spine (HCC) 08/16/2019   Altered mental status    Delirium    Toxic metabolic encephalopathy 07/26/2019   Polysubstance overdose 07/26/2019   Acute kidney injury (HCC) 07/26/2019   Chronic pain syndrome 07/26/2019   SIRS (systemic inflammatory response syndrome) (HCC) 07/26/2019   Spinal stenosis, lumbar region, with neurogenic claudication 04/11/2019    Class: Chronic   Lumbar disc herniation 04/11/2019    Class: Chronic   Status post lumbar spinal fusion 04/11/2019   Hardware complicating wound infection (HCC) 01/31/2018   Spondylolisthesis of lumbar region 12/30/2017   S/P ankle fusion 10/20/2017   Post-traumatic osteoarthritis, left ankle and foot 01/07/2017   Primary osteoarthritis of right knee 05/27/2016   GERD (gastroesophageal reflux disease) 07/09/2015   Mild persistent asthma 07/09/2015   Type 2 diabetes mellitus (HCC) 07/09/2015   Polyp of duodenum 06/04/2015   Alcohol dependence (HCC) 02/25/2015   Moderate episode of recurrent major depressive disorder (HCC) 02/25/2015   Upper GI bleed 02/18/2015   Benign essential HTN 02/18/2015   Anemia of chronic disease 02/18/2015   Pulmonary nodule 02/18/2015   Cocaine abuse (HCC) 03/02/2014   Posttraumatic stress disorder 03/02/2014   History of CVA (cerebrovascular accident) 01/12/2014   Schizophrenia (HCC) 01/12/2014   Nicotine dependence, cigarettes, uncomplicated 12/06/2013   PCP:  [name], MD Pharmacy:   [id number] - [place], [place] - [phone] AT [facility] [address] [place] [address] Phone: [phone] Fax: [phone]  [facility] - [place], [place] - [address] [place] [address] Phone: [phone] Fax: [phone]     Social Determinants of Health (SDOH) Interventions    Readmission Risk Interventions Readmission Risk Prevention Plan 08/21/2019   Transportation Screening Complete  Medication Review (RN Care Manager) Complete  HRI or Home Care Consult Complete  SW Recovery Care/Counseling Consult Complete  Palliative Care Screening Not Applicable  Skilled Nursing Facility Not Applicable  Some recent data might be hidden

## 2020-11-08 NOTE — Progress Notes (Signed)
ID Brief Note  Patient seen, he is awake, alert and oriented *3. Back pain is stable No new complaints  Afebrile, WBC is downtrending  See my note on 8/11 for recs ID will sign off for now. Please call with questions   Rosiland Oz, MD Infectious Disease Physician [facility] [address] [place], [place] [address] Phone: [phone]  Fax: [phone]

## 2020-11-09 LAB — BASIC METABOLIC PANEL
Anion gap: 9 (ref 5–15)
BUN: 11 mg/dL (ref 6–20)
CO2: 19 mmol/L — ABNORMAL LOW (ref 22–32)
Calcium: 8.1 mg/dL — ABNORMAL LOW (ref 8.9–10.3)
Chloride: 100 mmol/L (ref 98–111)
Creatinine, Ser: 0.68 mg/dL (ref 0.61–1.24)
GFR, Estimated: >60 mL/min (ref >60)
Glucose, Bld: 85 mg/dL (ref 70–99)
Potassium: 3 mmol/L — ABNORMAL LOW (ref 3.5–5.1)
Sodium: 128 mmol/L — ABNORMAL LOW (ref 135–145)

## 2020-11-09 LAB — GLUCOSE, CAPILLARY
Glucose-Capillary: 116 mg/dL — ABNORMAL HIGH (ref 70–99)
Glucose-Capillary: 131 mg/dL — ABNORMAL HIGH (ref 70–99)
Glucose-Capillary: 116 mg/dL — ABNORMAL HIGH (ref 70–99)
Glucose-Capillary: 146 mg/dL — ABNORMAL HIGH (ref 70–99)

## 2020-11-09 MED ORDER — KETOROLAC TROMETHAMINE 15 MG/ML IJ SOLN
15.0000 mg | Freq: Once | INTRAMUSCULAR | Status: AC
  Administered 2020-11-09: 15 mg via INTRAVENOUS
  Filled 2020-11-09: qty 1

## 2020-11-09 MED ORDER — OXYCODONE HCL 5 MG PO TABS
5.0000 mg | ORAL_TABLET | Freq: Once | ORAL | Status: AC
  Administered 2020-11-09: 5 mg via ORAL
  Filled 2020-11-09: qty 1

## 2020-11-09 MED ORDER — SODIUM CHLORIDE 1 G PO TABS
2.0000 g | ORAL_TABLET | Freq: Two times a day (BID) | ORAL | Status: AC
  Administered 2020-11-09 – 2020-11-15 (×14): 2 g via ORAL
  Filled 2020-11-09 (×14): qty 2

## 2020-11-09 MED ORDER — POTASSIUM CHLORIDE CRYS ER 20 MEQ PO TBCR
40.0000 meq | EXTENDED_RELEASE_TABLET | ORAL | Status: AC
Start: 2020-11-09 — End: 2020-11-09
  Administered 2020-11-09 (×2): 40 meq via ORAL
  Filled 2020-11-09 (×2): qty 2

## 2020-11-09 MED ORDER — OXYCODONE HCL 5 MG PO TABS
10.0000 mg | ORAL_TABLET | Freq: Four times a day (QID) | ORAL | Status: DC | PRN
  Administered 2020-11-09 – 2020-11-18 (×33): 10 mg via ORAL
  Filled 2020-11-09 (×36): qty 2

## 2020-11-09 NOTE — Progress Notes (Signed)
Patient received extra dose of oxycodone and toradol  per provider [place] Crane, MD orders. Pt was having increased pain. Pt finally resting after extra dose of oxycodone

## 2020-11-09 NOTE — Plan of Care (Signed)
Patient is alert oriented x 3. Pt does not know what year it is. Pt c/o of increased pain tonight. Site is intact, hot to touch, and swollen to left side. Provider informed, received x 1 dose of tordal.   Problem: Education: Goal: Knowledge of General Education information will improve Description: Including pain rating scale, medication(s)/side effects and non-pharmacologic comfort measures Outcome: Progressing   Problem: Health Behavior/Discharge Planning: Goal: Ability to manage health-related needs will improve Outcome: Progressing   Problem: Clinical Measurements: Goal: Ability to maintain clinical measurements within normal limits will improve Outcome: Progressing Goal: Will remain free from infection Outcome: Progressing Goal: Diagnostic test results will improve Outcome: Progressing Goal: Respiratory complications will improve Outcome: Progressing Goal: Cardiovascular complication will be avoided Outcome: Progressing   Problem: Activity: Goal: Risk for activity intolerance will decrease Outcome: Progressing   Problem: Nutrition: Goal: Adequate nutrition will be maintained Outcome: Progressing   Problem: Coping: Goal: Level of anxiety will decrease Outcome: Progressing   Problem: Elimination: Goal: Will not experience complications related to bowel motility Outcome: Progressing Goal: Will not experience complications related to urinary retention Outcome: Progressing   Problem: Pain Managment: Goal: General experience of comfort will improve Outcome: Progressing   Problem: Safety: Goal: Ability to remain free from injury will improve Outcome: Progressing   Problem: Skin Integrity: Goal: Risk for impaired skin integrity will decrease Outcome: Progressing   Problem: Education: Goal: Ability to verbalize activity precautions or restrictions will improve Outcome: Progressing Goal: Knowledge of the prescribed therapeutic regimen will improve Outcome:  Progressing Goal: Understanding of discharge needs will improve Outcome: Progressing   Problem: Activity: Goal: Ability to avoid complications of mobility impairment will improve Outcome: Progressing Goal: Ability to tolerate increased activity will improve Outcome: Progressing Goal: Will remain free from falls Outcome: Progressing   Problem: Bowel/Gastric: Goal: Gastrointestinal status for postoperative course will improve Outcome: Progressing   Problem: Clinical Measurements: Goal: Ability to maintain clinical measurements within normal limits will improve Outcome: Progressing Goal: Postoperative complications will be avoided or minimized Outcome: Progressing Goal: Diagnostic test results will improve Outcome: Progressing   Problem: Pain Management: Goal: Pain level will decrease Outcome: Progressing   Problem: Skin Integrity: Goal: Will show signs of wound healing Outcome: Progressing   Problem: Health Behavior/Discharge Planning: Goal: Identification of resources available to assist in meeting health care needs will improve Outcome: Progressing   Problem: Bladder/Genitourinary: Goal: Urinary functional status for postoperative course will improve Description: Voiding well in large amts.   Outcome: Progressing

## 2020-11-09 NOTE — Progress Notes (Signed)
PROGRESS NOTE    [name]  WUJ:[id number] DOB: [birth year] DOA: 11/02/2020 PCP: [name], MD   Chief Complain: Back pain  Brief Narrative:  Patient is a 55 year old male with recurrent discitis , admitted on 2021 for lumbar discitis with phlegmon requiring prolonged antibiotic course admitted this time for lower back pain for 2 days.  MRI showed epidural abscess, right-sided psoas myositis.  Neurosurgery, ID consulted.  Underwent L3-L4 laminectomies with evacuation of epidural abscess.  No further surgery planned by neurosurgery.  Currently on cefepime as per ID.  Plan for total of 2 weeks( from 8/7) of cefepime followed by ciprofloxacin for 6 weeks.  He needs to be in hospital for IV antibiotics because of history of drug abuse.  Assessment & Plan:   Principal Problem:   Epidural abscess Active Problems:   Benign essential HTN   L3-L4 discitis with osteomyelitis: Recurrent issue.  Presented with back pain.  MRI showed epidural abscess, right-sided psoas myositis.  Neurosurgery, ID consulted.  Underwent L3-L4 laminectomies with evacuation of epidural abscess.  No further surgery planned by neurosurgery.  Currently on cefepime as per ID.  Continue pain management, minimize IV narcotic use. Blood  Cultures remain negative so far.  Epidural abscess cultures showed Pseudomonas, sensitive to Cipro.   Plan for total of 2 weeks( from 8/7) of cefepime followed by ciprofloxacin for 6 weeks.  He needs to be in hospital for IV antibiotics because of history of drug abuse.  Hyponatremia:  Sodium at 128.  Urine sodium was less than 20.  Hyponatremia could be associated  with dehydration.  Slightly improved with normal saline, started NaCl   Hypertension: BP stable on carvedilol and amlodipine  Prediabetes: Blood sugars well controlled.  Hemoglobin A1c 5.9.  Continue sliding scale insulin.  History of schizophrenia: On Cogentin, Wellbutrin, gabapentin at home these medications are on hold  because of  confusion.Will consider resuming  Altered mental status: resolved.  Alert and oriented today  History of COPD: Continue as needed bronchodilators.  Currently on room air.  History of alcohol use/cocaine use: UDS positive for cocaine, opiates.  He was on Librium taper.         DVT prophylaxis:SCD Code Status: Full Family Communication: Called and discussed with the significant other on phone on 11/08/10 Status is: Inpatient  Remains inpatient appropriate because:Inpatient level of care appropriate due to severity of illness  Dispo: The patient is from: Home              Anticipated d/c is to: Home              Patient currently is not medically stable to d/c.   Difficult to place patient No Needs to continue IV antibiotic till 8/20    Consultants: ID,neurosurgery  Procedures:As above  Antimicrobials:  Anti-infectives (From admission, onward)    Start     Dose/Rate Route Frequency Ordered Stop   11/04/20 1800  vancomycin (VANCOREADY) IVPB 750 mg/150 mL  Status:  Discontinued        750 mg 150 mL/hr over 60 Minutes Intravenous Every 8 hours 11/04/20 0911 11/06/20 0836   11/04/20 1000  vancomycin (VANCOREADY) IVPB 2000 mg/400 mL        2,000 mg 200 mL/hr over 120 Minutes Intravenous  Once 11/04/20 0905 11/04/20 1237   11/04/20 1000  ceFEPIme (MAXIPIME) 2 g in sodium chloride 0.9 % 100 mL IVPB        2 g 200 mL/hr over 30 Minutes Intravenous Every  8 hours 11/04/20 0905     11/03/20 1457  ceFAZolin (ANCEF) 2-4 GM/100ML-% IVPB       Note to Pharmacy: Samuella Cota   : cabinet override      11/03/20 1457 11/04/20 0259       Subjective:  Patient seen and examined the bedside this morning.  Hemodynamically stable.  Complains of some back pain today but remains alert and oriented Objective: Vitals:   11/08/20 1934 11/08/20 2315 11/09/20 0331 11/09/20 0729  BP: 120/78 104/65 109/80 138/90  Pulse: 91 93 89 87  Resp: 17 16 16 18   Temp: 98 F (36.7 C) 98.2  F (36.8 C) 98.6 F (37 C) 98.2 F (36.8 C)  TempSrc: Oral Oral Oral Oral  SpO2: 100% 98% 99% 100%  Weight:      Height:        Intake/Output Summary (Last 24 hours) at 11/09/2020 0801 Last data filed at 11/08/2020 2030 Gross per 24 hour  Intake 240 ml  Output 1000 ml  Net -760 ml   Filed Weights   11/04/20 0356 11/05/20 0330 11/06/20 0336  Weight: 78.4 kg 78.2 kg 79.1 kg    Examination:  General exam: Overall comfortable, not in distress HEENT: PERRL Respiratory system:  no wheezes or crackles  Cardiovascular system: S1 & S2 heard, RRR.  Gastrointestinal system: Abdomen is nondistended, soft and nontender. Central nervous system: Alert and oriented Extremities: No edema, no clubbing ,no cyanosis Skin: No rashes, no ulcers,no icterus , epidural abscess incision site is clean    Data Reviewed: I have personally reviewed following labs and imaging studies  CBC: Recent Labs  Lab 11/02/20 1612 11/04/20 0228 11/05/20 0217 11/06/20 0146 11/07/20 0119 11/08/20 0352  WBC 21.0* 16.7* 14.9* 13.2* 14.5* 13.5*  NEUTROABS 19.3*  --   --   --   --  11.2*  HGB 11.9* 11.2* 10.5* 11.3* 10.8* 9.8*  HCT 35.6* 33.2* 31.9* 33.3* 32.2* 30.2*  MCV 83.4 82.2 83.1 82.2 82.4 84.1  PLT 326 347 334 336 349 400   Basic Metabolic Panel: Recent Labs  Lab 11/04/20 0228 11/05/20 0217 11/06/20 0146 11/07/20 0119 11/08/20 0352 11/09/20 0343  NA 131* 126* 122* 125* 129* 128*  K 3.4* 3.2* 3.7 3.3* 3.4* 3.0*  CL 98 95* 94* 96* 104 100  CO2 22 21* 18* 18* 17* 19*  GLUCOSE 127* 130* 122* 134* 108* 85  BUN 13 13 10 11 15 11   CREATININE 0.84 0.67 0.57* 0.58* 0.65 0.68  CALCIUM 8.9 8.5* 8.6* 8.5* 8.3* 8.1*  MG 2.0 1.7 1.7 1.7 1.9  --    GFR: Estimated Creatinine Clearance: 104.3 mL/min (by C-G formula based on SCr of 0.68 mg/dL). Liver Function Tests: Recent Labs  Lab 11/02/20 1612  AST 34  ALT 12  ALKPHOS 103  BILITOT 0.5  PROT 7.7  ALBUMIN 3.8   No results for input(s):  LIPASE, AMYLASE in the last 168 hours. Recent Labs  Lab 11/07/20 1134  AMMONIA 40*   Coagulation Profile: No results for input(s): INR, PROTIME in the last 168 hours. Cardiac Enzymes: No results for input(s): CKTOTAL, CKMB, CKMBINDEX, TROPONINI in the last 168 hours. BNP (last 3 results) No results for input(s): PROBNP in the last 8760 hours. HbA1C: No results for input(s): HGBA1C in the last 72 hours. CBG: Recent Labs  Lab 11/07/20 2124 11/08/20 0601 11/08/20 1220 11/08/20 1636 11/09/20 0634  GLUCAP 149* 120* 124* 109* 116*   Lipid Profile: No results for input(s): CHOL, HDL, LDLCALC,  TRIG, CHOLHDL, LDLDIRECT in the last 72 hours. Thyroid Function Tests: Recent Labs    11/07/20 1134  TSH 1.027   Anemia Panel: No results for input(s): VITAMINB12, FOLATE, FERRITIN, TIBC, IRON, RETICCTPCT in the last 72 hours. Sepsis Labs: Recent Labs  Lab 11/02/20 2000  LATICACIDVEN 1.6    Recent Results (from the past 240 hour(s))  Resp Panel by RT-PCR (Flu A&B, Covid) Urine, Unspecified Source     Status: None   Collection Time: 11/02/20  4:13 PM   Specimen: Urine, Unspecified Source; Nasopharyngeal(NP) swabs in vial transport medium  Result Value Ref Range Status   SARS Coronavirus 2 by RT PCR NEGATIVE NEGATIVE Final    Comment: (NOTE) SARS-CoV-2 target nucleic acids are NOT DETECTED.  The SARS-CoV-2 RNA is generally detectable in upper respiratory specimens during the acute phase of infection. The lowest concentration of SARS-CoV-2 viral copies this assay can detect is 138 copies/mL. A negative result does not preclude SARS-Cov-2 infection and should not be used as the sole basis for treatment or other patient management decisions. A negative result may occur with  improper specimen collection/handling, submission of specimen other than nasopharyngeal swab, presence of viral mutation(s) within the areas targeted by this assay, and inadequate number of viral copies(<138  copies/mL). A negative result must be combined with clinical observations, patient history, and epidemiological information. The expected result is Negative.  Fact Sheet for Patients:  BloggerCourse.com  Fact Sheet for Healthcare Providers:  SeriousBroker.it  This test is no t yet approved or cleared by the [place] FDA and  has been authorized for detection and/or diagnosis of SARS-CoV-2 by FDA under an Emergency Use Authorization (EUA). This EUA will remain  in effect (meaning this test can be used) for the duration of the COVID-19 declaration under Section 564(b)(1) of the Act, 21 U.S.C.section 360bbb-3(b)(1), unless the authorization is terminated  or revoked sooner.       Influenza A by PCR NEGATIVE NEGATIVE Final   Influenza B by PCR NEGATIVE NEGATIVE Final    Comment: (NOTE) The Xpert Xpress SARS-CoV-2/FLU/RSV plus assay is intended as an aid in the diagnosis of influenza from Nasopharyngeal swab specimens and should not be used as a sole basis for treatment. Nasal washings and aspirates are unacceptable for Xpert Xpress SARS-CoV-2/FLU/RSV testing.  Fact Sheet for Patients: BloggerCourse.com  Fact Sheet for Healthcare Providers: SeriousBroker.it  This test is not yet approved or cleared by the [place] FDA and has been authorized for detection and/or diagnosis of SARS-CoV-2 by FDA under an Emergency Use Authorization (EUA). This EUA will remain in effect (meaning this test can be used) for the duration of the COVID-19 declaration under Section 564(b)(1) of the Act, 21 U.S.C. section 360bbb-3(b)(1), unless the authorization is terminated or revoked.  Performed at [facility], [address], [place], [place] [address]   Blood culture (routine x 2)     Status: None   Collection Time: 11/02/20  8:00 PM   Specimen: BLOOD  Result Value Ref  Range Status   Specimen Description   Final    BLOOD BLOOD RIGHT HAND Performed at [facility], [address], [place], [place] [address]    Special Requests   Final    BOTTLES DRAWN AEROBIC AND ANAEROBIC Blood Culture adequate volume Performed at [facility], [address] [place], [place] [address]    Culture   Final    NO GROWTH 5 DAYS Performed at [facility],  [address] [place], [place] [address]    Report Status 11/08/2020 FINAL  Final  Blood culture (routine x 2)     Status: None   Collection Time: 11/02/20  8:10 PM   Specimen: BLOOD  Result Value Ref Range Status   Specimen Description   Final    BLOOD BLOOD LEFT HAND Performed at [facility], [address] [place], [place] [address]    Special Requests   Final    BOTTLES DRAWN AEROBIC ONLY Blood Culture results may not be optimal due to an inadequate volume of blood received in culture bottles Performed at [facility], [address] [place], [place] [address]    Culture   Final    NO GROWTH 5 DAYS Performed at [facility], [address] [place], [place] [address]    Report Status 11/08/2020 FINAL  Final  Surgical pcr screen     Status: None   Collection Time: 11/03/20  2:14 PM   Specimen: Nasal Mucosa; Nasal Swab  Result Value Ref Range Status   MRSA, PCR NEGATIVE NEGATIVE Final   Staphylococcus aureus NEGATIVE NEGATIVE Final    Comment: (NOTE) The Xpert SA Assay (FDA approved for NASAL specimens in patients 78 years of age and older), is one component of a comprehensive surveillance program. It is not intended to diagnose infection nor to guide or monitor treatment. Performed at [facility], [address], [place], [place] [address]   Aerobic/Anaerobic Culture w Gram Stain (surgical/deep wound)     Status: None   Collection Time: 11/03/20  3:49 PM   Specimen: Abscess  Result Value Ref Range  Status   Specimen Description ABSCESS  Final   Special Requests LUMBAR EPIDURAL ABSCESS SPEC A  Final   Gram Stain   Final    RARE WBC PRESENT, PREDOMINANTLY PMN NO ORGANISMS SEEN    Culture   Final    RARE PSEUDOMONAS AERUGINOSA NO ANAEROBES ISOLATED Performed at [facility], [address], [place], [place] [address]    Report Status 11/08/2020 FINAL  Final   Organism ID, Bacteria PSEUDOMONAS AERUGINOSA  Final      Susceptibility   Pseudomonas aeruginosa - MIC*    CEFTAZIDIME 4 SENSITIVE Sensitive     CIPROFLOXACIN <=0.25 SENSITIVE Sensitive     GENTAMICIN <=1 SENSITIVE Sensitive     IMIPENEM 1 SENSITIVE Sensitive     PIP/TAZO <=4 SENSITIVE Sensitive     CEFEPIME 2 SENSITIVE Sensitive     * RARE PSEUDOMONAS AERUGINOSA         Radiology Studies: No results found.      Scheduled Meds:  amLODipine  10 mg Oral Daily   carvedilol  12.5 mg Oral BID WC   ferrous sulfate  325 mg Oral Q breakfast   insulin aspart  0-9 Units Subcutaneous TID WC   lactulose  10 g Oral TID   lidocaine  1 patch Transdermal Q24H   melatonin  10 mg Oral QHS   nicotine  14 mg Transdermal Daily   pantoprazole  40 mg Oral Daily   potassium chloride  40 mEq Oral Q2H   sodium chloride  2 g Oral BID WC   sucralfate  1 g Oral TID WC & HS   Continuous Infusions:  ceFEPime (MAXIPIME) IV 2 g (11/09/20 0100)     LOS: 6 days    Time spent:25 mins,. More than 50% of that time was spent in  counseling and/or coordination of care.      Burnadette Pop, MD [facility] P8/13/2022, 8:01 AM

## 2020-11-10 LAB — BASIC METABOLIC PANEL
Anion gap: 9 (ref 5–15)
BUN: 10 mg/dL (ref 6–20)
CO2: 21 mmol/L — ABNORMAL LOW (ref 22–32)
Calcium: 8.8 mg/dL — ABNORMAL LOW (ref 8.9–10.3)
Chloride: 101 mmol/L (ref 98–111)
Creatinine, Ser: 0.64 mg/dL (ref 0.61–1.24)
GFR, Estimated: >60 mL/min (ref >60)
Glucose, Bld: 124 mg/dL — ABNORMAL HIGH (ref 70–99)
Potassium: 3.4 mmol/L — ABNORMAL LOW (ref 3.5–5.1)
Sodium: 131 mmol/L — ABNORMAL LOW (ref 135–145)

## 2020-11-10 LAB — GLUCOSE, CAPILLARY
Glucose-Capillary: 114 mg/dL — ABNORMAL HIGH (ref 70–99)
Glucose-Capillary: 133 mg/dL — ABNORMAL HIGH (ref 70–99)
Glucose-Capillary: 109 mg/dL — ABNORMAL HIGH (ref 70–99)
Glucose-Capillary: 116 mg/dL — ABNORMAL HIGH (ref 70–99)

## 2020-11-10 MED ORDER — KETOROLAC TROMETHAMINE 15 MG/ML IJ SOLN
15.0000 mg | Freq: Two times a day (BID) | INTRAMUSCULAR | Status: DC | PRN
  Administered 2020-11-10 (×2): 15 mg via INTRAVENOUS
  Filled 2020-11-10 (×2): qty 1

## 2020-11-10 MED ORDER — POTASSIUM CHLORIDE CRYS ER 20 MEQ PO TBCR
40.0000 meq | EXTENDED_RELEASE_TABLET | Freq: Once | ORAL | Status: AC
  Administered 2020-11-10: 40 meq via ORAL
  Filled 2020-11-10: qty 2

## 2020-11-10 MED ORDER — TRAZODONE HCL 50 MG PO TABS
50.0000 mg | ORAL_TABLET | Freq: Every evening | ORAL | Status: AC | PRN
  Administered 2020-11-10: 50 mg via ORAL
  Filled 2020-11-10: qty 1

## 2020-11-10 NOTE — Progress Notes (Addendum)
Pt's significant other called night shift RN and stated the patient "doesn't normally take gabapentin or cogentin at home because it makes him confused". Passed information on to dayshift RN.  This is regarding the plan to possibly resume these home meds once the patient is less confused in hospital.

## 2020-11-10 NOTE — Progress Notes (Signed)
PROGRESS NOTE    ERVING HON  ZOX:[id number] DOB: 1965-08-16 DOA: 11/02/2020 PCP: [name], MD   Chief Complain: Back pain  Brief Narrative:  Patient is a 55 year old male with recurrent discitis , admitted on 2021 for lumbar discitis with phlegmon requiring prolonged antibiotic course admitted this time for lower back pain for 2 days.  MRI showed epidural abscess, right-sided psoas myositis.  Neurosurgery, ID consulted.  Underwent L3-L4 laminectomies with evacuation of epidural abscess.  No further surgery planned by neurosurgery.  Currently on cefepime as per ID.  Plan for total of 2 weeks( from 8/7) of cefepime followed by ciprofloxacin for 6 weeks.  He needs to be in hospital for IV antibiotics because of history of drug abuse.  Assessment & Plan:   Principal Problem:   Epidural abscess Active Problems:   Benign essential HTN   L3-L4 discitis with osteomyelitis: Recurrent issue.  Presented with back pain.  MRI showed epidural abscess, right-sided psoas myositis.  Neurosurgery, ID consulted.  Underwent L3-L4 laminectomies with evacuation of epidural abscess.  No further surgery planned by neurosurgery.  Currently on cefepime as per ID.  Continue pain management, minimize IV narcotic use. Blood  Cultures remain negative so far.  Epidural abscess cultures showed Pseudomonas, sensitive to Cipro.   Plan for total of 2 weeks( from 8/7) of cefepime followed by ciprofloxacin for 6 weeks.  He needs to be in hospital for IV antibiotics because of history of drug abuse.  Hyponatremia/hypokalemia: Sodium level improving with sodium tablets.  Potassium supplemented  Hypertension: BP stable on carvedilol and amlodipine  Prediabetes: Blood sugars well controlled.  Hemoglobin A1c 5.9.  Continue sliding scale insulin.  History of schizophrenia: On Cogentin, Wellbutrin, gabapentin at home  as per med rec,these medications are on hold because of  confusion.As per the spouse he does not take  gabapentin or Cogentin because it makes him confused.  Altered mental status: resolved.  Alert and oriented today  History of COPD: Continue as needed bronchodilators.  Currently on room air.  Sinus tachycardia: Continue to monitor  History of alcohol use/cocaine use: UDS positive for cocaine, opiates.  He was on Librium taper.         DVT prophylaxis:SCD Code Status: Full Family Communication: Called and discussed with the significant other on phone on 11/08/10 Status is: Inpatient  Remains inpatient appropriate because:Inpatient level of care appropriate due to severity of illness  Dispo: The patient is from: Home              Anticipated d/c is to: Home              Patient currently is not medically stable to d/c.   Difficult to place patient No Needs to continue IV antibiotic till 8/20    Consultants: ID,neurosurgery  Procedures:As above  Antimicrobials:  Anti-infectives (From admission, onward)    Start     Dose/Rate Route Frequency Ordered Stop   11/04/20 1800  vancomycin (VANCOREADY) IVPB 750 mg/150 mL  Status:  Discontinued        750 mg 150 mL/hr over 60 Minutes Intravenous Every 8 hours 11/04/20 0911 11/06/20 0836   11/04/20 1000  vancomycin (VANCOREADY) IVPB 2000 mg/400 mL        2,000 mg 200 mL/hr over 120 Minutes Intravenous  Once 11/04/20 0905 11/04/20 1237   11/04/20 1000  ceFEPIme (MAXIPIME) 2 g in sodium chloride 0.9 % 100 mL IVPB        2 g 200 mL/hr over  30 Minutes Intravenous Every 8 hours 11/04/20 0905     11/03/20 1457  ceFAZolin (ANCEF) 2-4 GM/100ML-% IVPB       Note to Pharmacy: Samuella Cota   : cabinet override      11/03/20 1457 11/04/20 0259       Subjective:  Patient seen and examined the bedside this morning.  Hemodynamically stable.  Was in sinus tachycardia this morning.  Complains of low back pain.  Alert /oriented  Objective: Vitals:   11/09/20 1212 11/09/20 1556 11/09/20 2023 11/10/20 0416  BP: (!) 150/94 (!) 142/101 (!)  166/100 (!) 146/96  Pulse: 90 (!) 101 (!) 105 (!) 109  Resp:   20 20  Temp:  98.7 F (37.1 C) 98 F (36.7 C) 98 F (36.7 C)  TempSrc:  Oral Oral Oral  SpO2: 100% 100% 100% 99%  Weight:      Height:        Intake/Output Summary (Last 24 hours) at 11/10/2020 0730 Last data filed at 11/09/2020 2023 Gross per 24 hour  Intake 1030 ml  Output 3500 ml  Net -2470 ml   Filed Weights   11/04/20 0356 11/05/20 0330 11/06/20 0336  Weight: 78.4 kg 78.2 kg 79.1 kg    Examination:  General exam: Overall comfortable, not in distress HEENT: PERRL Respiratory system:  no wheezes or crackles  Cardiovascular system: S1 & S2 heard, RRR.  Gastrointestinal system: Abdomen is nondistended, soft and nontender. Central nervous system: Alert and oriented Extremities: No edema, no clubbing ,no cyanosis Skin: No rashes, no ulcers,no icterus     Data Reviewed: I have personally reviewed following labs and imaging studies  CBC: Recent Labs  Lab 11/04/20 0228 11/05/20 0217 11/06/20 0146 11/07/20 0119 11/08/20 0352  WBC 16.7* 14.9* 13.2* 14.5* 13.5*  NEUTROABS  --   --   --   --  11.2*  HGB 11.2* 10.5* 11.3* 10.8* 9.8*  HCT 33.2* 31.9* 33.3* 32.2* 30.2*  MCV 82.2 83.1 82.2 82.4 84.1  PLT 347 334 336 349 400   Basic Metabolic Panel: Recent Labs  Lab 11/04/20 0228 11/05/20 0217 11/06/20 0146 11/07/20 0119 11/08/20 0352 11/09/20 0343 11/10/20 0528  NA 131* 126* 122* 125* 129* 128* 131*  K 3.4* 3.2* 3.7 3.3* 3.4* 3.0* 3.4*  CL 98 95* 94* 96* 104 100 101  CO2 22 21* 18* 18* 17* 19* 21*  GLUCOSE 127* 130* 122* 134* 108* 85 124*  BUN 13 13 10 11 15 11 10   CREATININE 0.84 0.67 0.57* 0.58* 0.65 0.68 0.64  CALCIUM 8.9 8.5* 8.6* 8.5* 8.3* 8.1* 8.8*  MG 2.0 1.7 1.7 1.7 1.9  --   --    GFR: Estimated Creatinine Clearance: 104.3 mL/min (by C-G formula based on SCr of 0.64 mg/dL). Liver Function Tests: No results for input(s): AST, ALT, ALKPHOS, BILITOT, PROT, ALBUMIN in the last 168  hours.  No results for input(s): LIPASE, AMYLASE in the last 168 hours. Recent Labs  Lab 11/07/20 1134  AMMONIA 40*   Coagulation Profile: No results for input(s): INR, PROTIME in the last 168 hours. Cardiac Enzymes: No results for input(s): CKTOTAL, CKMB, CKMBINDEX, TROPONINI in the last 168 hours. BNP (last 3 results) No results for input(s): PROBNP in the last 8760 hours. HbA1C: No results for input(s): HGBA1C in the last 72 hours. CBG: Recent Labs  Lab 11/09/20 0634 11/09/20 1212 11/09/20 1557 11/09/20 2128 11/10/20 0619  GLUCAP 116* 131* 116* 146* 114*   Lipid Profile: No results for input(s): CHOL,  HDL, LDLCALC, TRIG, CHOLHDL, LDLDIRECT in the last 72 hours. Thyroid Function Tests: Recent Labs    11/07/20 1134  TSH 1.027   Anemia Panel: No results for input(s): VITAMINB12, FOLATE, FERRITIN, TIBC, IRON, RETICCTPCT in the last 72 hours. Sepsis Labs: No results for input(s): PROCALCITON, LATICACIDVEN in the last 168 hours.   Recent Results (from the past 240 hour(s))  Resp Panel by RT-PCR (Flu A&B, Covid) Urine, Unspecified Source     Status: None   Collection Time: 11/02/20  4:13 PM   Specimen: Urine, Unspecified Source; Nasopharyngeal(NP) swabs in vial transport medium  Result Value Ref Range Status   SARS Coronavirus 2 by RT PCR NEGATIVE NEGATIVE Final    Comment: (NOTE) SARS-CoV-2 target nucleic acids are NOT DETECTED.  The SARS-CoV-2 RNA is generally detectable in upper respiratory specimens during the acute phase of infection. The lowest concentration of SARS-CoV-2 viral copies this assay can detect is 138 copies/mL. A negative result does not preclude SARS-Cov-2 infection and should not be used as the sole basis for treatment or other patient management decisions. A negative result may occur with  improper specimen collection/handling, submission of specimen other than nasopharyngeal swab, presence of viral mutation(s) within the areas targeted by  this assay, and inadequate number of viral copies(<138 copies/mL). A negative result must be combined with clinical observations, patient history, and epidemiological information. The expected result is Negative.  Fact Sheet for Patients:  BloggerCourse.com  Fact Sheet for Healthcare Providers:  SeriousBroker.it  This test is no t yet approved or cleared by the [place] FDA and  has been authorized for detection and/or diagnosis of SARS-CoV-2 by FDA under an Emergency Use Authorization (EUA). This EUA will remain  in effect (meaning this test can be used) for the duration of the COVID-19 declaration under Section 564(b)(1) of the Act, 21 U.S.C.section 360bbb-3(b)(1), unless the authorization is terminated  or revoked sooner.       Influenza A by PCR NEGATIVE NEGATIVE Final   Influenza B by PCR NEGATIVE NEGATIVE Final    Comment: (NOTE) The Xpert Xpress SARS-CoV-2/FLU/RSV plus assay is intended as an aid in the diagnosis of influenza from Nasopharyngeal swab specimens and should not be used as a sole basis for treatment. Nasal washings and aspirates are unacceptable for Xpert Xpress SARS-CoV-2/FLU/RSV testing.  Fact Sheet for Patients: BloggerCourse.com  Fact Sheet for Healthcare Providers: SeriousBroker.it  This test is not yet approved or cleared by the [place] FDA and has been authorized for detection and/or diagnosis of SARS-CoV-2 by FDA under an Emergency Use Authorization (EUA). This EUA will remain in effect (meaning this test can be used) for the duration of the COVID-19 declaration under Section 564(b)(1) of the Act, 21 U.S.C. section 360bbb-3(b)(1), unless the authorization is terminated or revoked.  Performed at [facility], [address], [place], [place] [address]   Blood culture (routine x 2)     Status: None   Collection Time:  11/02/20  8:00 PM   Specimen: BLOOD  Result Value Ref Range Status   Specimen Description   Final    BLOOD BLOOD RIGHT HAND Performed at [facility], [address] [place], [place] [address]    Special Requests   Final    BOTTLES DRAWN AEROBIC AND ANAEROBIC Blood Culture adequate volume Performed at [facility], [address], [place], [place] [address]    Culture   Final    NO GROWTH 5 DAYS Performed at [facility], [address], [place], [place] [address]    Report Status 11/08/2020 FINAL  Final  Blood culture (routine x 2)     Status: None   Collection Time: 11/02/20  8:10 PM   Specimen: BLOOD  Result Value Ref Range Status   Specimen Description   Final    BLOOD BLOOD LEFT HAND Performed at [facility], [address] [place], [place] [address]    Special Requests   Final    BOTTLES DRAWN AEROBIC ONLY Blood Culture results may not be optimal due to an inadequate volume of blood received in culture bottles Performed at [facility], [address] [place], [place] [address]    Culture   Final    NO GROWTH 5 DAYS Performed at [facility], [address], [place], [place] [address]    Report Status 11/08/2020 FINAL  Final  Surgical pcr screen     Status: None   Collection Time: 11/03/20  2:14 PM   Specimen: Nasal Mucosa; Nasal Swab  Result Value Ref Range Status   MRSA, PCR NEGATIVE NEGATIVE Final   Staphylococcus aureus NEGATIVE NEGATIVE Final    Comment: (NOTE) The Xpert SA Assay (FDA approved for NASAL specimens in patients 23 years of age and older), is one component of a comprehensive surveillance program. It is not intended to diagnose infection nor to guide or monitor treatment. Performed at [facility], [address], [place], [place] [address]   Aerobic/Anaerobic Culture w Gram Stain (surgical/deep wound)     Status: None   Collection Time: 11/03/20   3:49 PM   Specimen: Abscess  Result Value Ref Range Status   Specimen Description ABSCESS  Final   Special Requests LUMBAR EPIDURAL ABSCESS SPEC A  Final   Gram Stain   Final    RARE WBC PRESENT, PREDOMINANTLY PMN NO ORGANISMS SEEN    Culture   Final    RARE PSEUDOMONAS AERUGINOSA NO ANAEROBES ISOLATED Performed at [facility], [address], [place], [place] [address]    Report Status 11/08/2020 FINAL  Final   Organism ID, Bacteria PSEUDOMONAS AERUGINOSA  Final      Susceptibility   Pseudomonas aeruginosa - MIC*    CEFTAZIDIME 4 SENSITIVE Sensitive     CIPROFLOXACIN <=0.25 SENSITIVE Sensitive     GENTAMICIN <=1 SENSITIVE Sensitive     IMIPENEM 1 SENSITIVE Sensitive     PIP/TAZO <=4 SENSITIVE Sensitive     CEFEPIME 2 SENSITIVE Sensitive     * RARE PSEUDOMONAS AERUGINOSA         Radiology Studies: No results found.      Scheduled Meds:  amLODipine  10 mg Oral Daily   carvedilol  12.5 mg Oral BID WC   ferrous sulfate  325 mg Oral Q breakfast   insulin aspart  0-9 Units Subcutaneous TID WC   lactulose  10 g Oral TID   lidocaine  1 patch Transdermal Q24H   melatonin  10 mg Oral QHS   nicotine  14 mg Transdermal Daily   pantoprazole  40 mg Oral Daily   potassium chloride  40 mEq Oral Once   sodium chloride  2 g Oral BID WC   sucralfate  1 g Oral TID WC & HS   Continuous Infusions:  ceFEPime (MAXIPIME) IV 2 g (11/10/20 0237)     LOS: 7 days    Time spent:25 mins,. More than 50% of that time  was spent in counseling and/or coordination of care.      Burnadette Pop, MD [facility] P8/14/2022, 7:30 AM

## 2020-11-11 LAB — CBC WITH DIFFERENTIAL/PLATELET
Abs Immature Granulocytes: 0.12 10*3/uL — ABNORMAL HIGH (ref 0.00–0.07)
Basophils Absolute: 0 10*3/uL (ref 0.0–0.1)
Basophils Relative: 0 %
Eosinophils Absolute: 0.2 10*3/uL (ref 0.0–0.5)
Eosinophils Relative: 2 %
HCT: 32.7 % — ABNORMAL LOW (ref 39.0–52.0)
Hemoglobin: 11 g/dL — ABNORMAL LOW (ref 13.0–17.0)
Immature Granulocytes: 1 %
Lymphocytes Relative: 15 %
Lymphs Abs: 1.7 10*3/uL (ref 0.7–4.0)
MCH: 28.2 pg (ref 26.0–34.0)
MCHC: 33.6 g/dL (ref 30.0–36.0)
MCV: 83.8 fL (ref 80.0–100.0)
Monocytes Absolute: 0.9 10*3/uL (ref 0.1–1.0)
Monocytes Relative: 8 %
Neutro Abs: 8.4 10*3/uL — ABNORMAL HIGH (ref 1.7–7.7)
Neutrophils Relative %: 74 %
Platelets: 520 10*3/uL — ABNORMAL HIGH (ref 150–400)
RBC: 3.9 MIL/uL — ABNORMAL LOW (ref 4.22–5.81)
RDW: 14.4 % (ref 11.5–15.5)
WBC: 11.3 10*3/uL — ABNORMAL HIGH (ref 4.0–10.5)
nRBC: 0 % (ref 0.0–0.2)

## 2020-11-11 LAB — BASIC METABOLIC PANEL
Anion gap: 8 (ref 5–15)
BUN: 13 mg/dL (ref 6–20)
CO2: 22 mmol/L (ref 22–32)
Calcium: 8.7 mg/dL — ABNORMAL LOW (ref 8.9–10.3)
Chloride: 100 mmol/L (ref 98–111)
Creatinine, Ser: 0.72 mg/dL (ref 0.61–1.24)
GFR, Estimated: >60 mL/min (ref >60)
Glucose, Bld: 110 mg/dL — ABNORMAL HIGH (ref 70–99)
Potassium: 3.8 mmol/L (ref 3.5–5.1)
Sodium: 130 mmol/L — ABNORMAL LOW (ref 135–145)

## 2020-11-11 LAB — GLUCOSE, CAPILLARY
Glucose-Capillary: 152 mg/dL — ABNORMAL HIGH (ref 70–99)
Glucose-Capillary: 130 mg/dL — ABNORMAL HIGH (ref 70–99)
Glucose-Capillary: 159 mg/dL — ABNORMAL HIGH (ref 70–99)
Glucose-Capillary: 129 mg/dL — ABNORMAL HIGH (ref 70–99)
Glucose-Capillary: 151 mg/dL — ABNORMAL HIGH (ref 70–99)

## 2020-11-11 MED ORDER — METOPROLOL SUCCINATE ER 50 MG PO TB24
50.0000 mg | ORAL_TABLET | Freq: Every day | ORAL | Status: DC
  Administered 2020-11-11 – 2020-11-18 (×8): 50 mg via ORAL
  Filled 2020-11-11 (×8): qty 1

## 2020-11-11 MED ORDER — IBUPROFEN 200 MG PO TABS
600.0000 mg | ORAL_TABLET | Freq: Four times a day (QID) | ORAL | Status: DC | PRN
  Administered 2020-11-11 – 2020-11-18 (×16): 600 mg via ORAL
  Filled 2020-11-11 (×16): qty 3

## 2020-11-11 MED ORDER — METHOCARBAMOL 750 MG PO TABS
750.0000 mg | ORAL_TABLET | Freq: Three times a day (TID) | ORAL | Status: DC | PRN
  Administered 2020-11-11 – 2020-11-18 (×12): 750 mg via ORAL
  Filled 2020-11-11 (×12): qty 1

## 2020-11-11 NOTE — Progress Notes (Signed)
PROGRESS NOTE    [name]  ZOX:[id number] DOB: [birth year] DOA: 11/02/2020 PCP: [name], MD   Chief Complain: Back pain  Brief Narrative:  Patient is a 55 year old male with recurrent discitis , admitted on 2021 for lumbar discitis with phlegmon requiring prolonged antibiotic course admitted this time for lower back pain for 2 days.  MRI showed epidural abscess, right-sided psoas myositis.  Neurosurgery, ID consulted.  Underwent L3-L4 laminectomies with evacuation of epidural abscess.  No further surgery planned by neurosurgery.  Currently on cefepime as per ID.  Plan for total of 2 weeks( from 8/7) of cefepime followed by ciprofloxacin for 6 weeks.  He needs to be in hospital for IV antibiotics because of history of drug abuse.  Assessment & Plan:   Principal Problem:   Epidural abscess Active Problems:   Benign essential HTN   L3-L4 discitis with osteomyelitis: Recurrent issue.  Presented with back pain.  MRI showed epidural abscess, right-sided psoas myositis.  Neurosurgery, ID consulted.  Underwent L3-L4 laminectomies with evacuation of epidural abscess.  No further surgery planned by neurosurgery.  Currently on cefepime as per ID.  Continue pain management, minimize IV narcotic use. Blood  Cultures remain negative so far.  Epidural abscess cultures showed Pseudomonas, sensitive to Cipro.   Plan for total of 2 weeks( from 8/7) of cefepime followed by ciprofloxacin for 6 weeks.  He needs to be in hospital for IV antibiotics because of history of drug abuse.  Hyponatremia/hypokalemia: Sodium level improving with sodium tablets.  Potassium supplemented  Hypertension: BP stable on metoprolol and amlodipine  Prediabetes: Blood sugars well controlled.  Hemoglobin A1c 5.9.  Continue sliding scale insulin.  History of schizophrenia: On Cogentin, Wellbutrin, gabapentin at home  as per med rec,these medications are on hold because of  confusion.As per the spouse he does not take  gabapentin or Cogentin because it makes him confused.  Altered mental status: resolved.  Alert and oriented today  History of COPD: Continue as needed bronchodilators.  Currently on room air.  Sinus tachycardia: Continue to monitor,restarted home metoprolol.Monitor  History of alcohol use/cocaine use: UDS positive for cocaine, opiates.  He was on Librium taper.         DVT prophylaxis:SCD Code Status: Full Family Communication: Called and discussed with the significant other on phone on 11/08/10 Status is: Inpatient  Remains inpatient appropriate because:Inpatient level of care appropriate due to severity of illness  Dispo: The patient is from: Home              Anticipated d/c is to: Home              Patient currently is not medically stable to d/c.   Difficult to place patient No Needs to continue IV antibiotic till 8/20    Consultants: ID,neurosurgery  Procedures:As above  Antimicrobials:  Anti-infectives (From admission, onward)    Start     Dose/Rate Route Frequency Ordered Stop   11/04/20 1800  vancomycin (VANCOREADY) IVPB 750 mg/150 mL  Status:  Discontinued        750 mg 150 mL/hr over 60 Minutes Intravenous Every 8 hours 11/04/20 0911 11/06/20 0836   11/04/20 1000  vancomycin (VANCOREADY) IVPB 2000 mg/400 mL        2,000 mg 200 mL/hr over 120 Minutes Intravenous  Once 11/04/20 0905 11/04/20 1237   11/04/20 1000  ceFEPIme (MAXIPIME) 2 g in sodium chloride 0.9 % 100 mL IVPB        2 g 200  mL/hr over 30 Minutes Intravenous Every 8 hours 11/04/20 0905     11/03/20 1457  ceFAZolin (ANCEF) 2-4 GM/100ML-% IVPB       Note to Pharmacy: Samuella Cota   : cabinet override      11/03/20 1457 11/04/20 0259       Subjective:  Patient seen and examined at the bedside this morning.  Continues to be in mild sinus tachycardia.  Complains of back pain.  Objective: Vitals:   11/11/20 0033 11/11/20 0424 11/11/20 0854 11/11/20 1151  BP: 119/88 122/89 111/78 (!) 131/91   Pulse: 100 98 (!) 108 (!) 107  Resp: 18     Temp: 97.8 F (36.6 C) 98.1 F (36.7 C) 98.4 F (36.9 C)   TempSrc: Oral Oral Oral   SpO2: 99% 98% 98% 100%  Weight:      Height:        Intake/Output Summary (Last 24 hours) at 11/11/2020 1206 Last data filed at 11/11/2020 0300 Gross per 24 hour  Intake 240 ml  Output 3000 ml  Net -2760 ml   Filed Weights   11/04/20 0356 11/05/20 0330 11/06/20 0336  Weight: 78.4 kg 78.2 kg 79.1 kg    Examination:  General exam: Overall comfortable, not in distress HEENT: PERRL Respiratory system:  no wheezes or crackles  Cardiovascular system: sinus tach  Gastrointestinal system: Abdomen is nondistended, soft and nontender. Central nervous system: Alert and oriented Extremities: No edema, no clubbing ,no cyanosis Skin: No rashes, no ulcers,no icterus      Data Reviewed: I have personally reviewed following labs and imaging studies  CBC: Recent Labs  Lab 11/05/20 0217 11/06/20 0146 11/07/20 0119 11/08/20 0352 11/11/20 0238  WBC 14.9* 13.2* 14.5* 13.5* 11.3*  NEUTROABS  --   --   --  11.2* 8.4*  HGB 10.5* 11.3* 10.8* 9.8* 11.0*  HCT 31.9* 33.3* 32.2* 30.2* 32.7*  MCV 83.1 82.2 82.4 84.1 83.8  PLT 334 336 349 400 520*   Basic Metabolic Panel: Recent Labs  Lab 11/05/20 0217 11/06/20 0146 11/07/20 0119 11/08/20 0352 11/09/20 0343 11/10/20 0528 11/11/20 0238  NA 126* 122* 125* 129* 128* 131* 130*  K 3.2* 3.7 3.3* 3.4* 3.0* 3.4* 3.8  CL 95* 94* 96* 104 100 101 100  CO2 21* 18* 18* 17* 19* 21* 22  GLUCOSE 130* 122* 134* 108* 85 124* 110*  BUN 13 10 11 15 11 10 13   CREATININE 0.67 0.57* 0.58* 0.65 0.68 0.64 0.72  CALCIUM 8.5* 8.6* 8.5* 8.3* 8.1* 8.8* 8.7*  MG 1.7 1.7 1.7 1.9  --   --   --    GFR: Estimated Creatinine Clearance: 104.3 mL/min (by C-G formula based on SCr of 0.72 mg/dL). Liver Function Tests: No results for input(s): AST, ALT, ALKPHOS, BILITOT, PROT, ALBUMIN in the last 168 hours.  No results for input(s):  LIPASE, AMYLASE in the last 168 hours. Recent Labs  Lab 11/07/20 1134  AMMONIA 40*   Coagulation Profile: No results for input(s): INR, PROTIME in the last 168 hours. Cardiac Enzymes: No results for input(s): CKTOTAL, CKMB, CKMBINDEX, TROPONINI in the last 168 hours. BNP (last 3 results) No results for input(s): PROBNP in the last 8760 hours. HbA1C: No results for input(s): HGBA1C in the last 72 hours. CBG: Recent Labs  Lab 11/10/20 1115 11/10/20 1603 11/10/20 2204 11/11/20 0627 11/11/20 1200  GLUCAP 133* 109* 116* 130* 159*   Lipid Profile: No results for input(s): CHOL, HDL, LDLCALC, TRIG, CHOLHDL, LDLDIRECT in the last 72  hours. Thyroid Function Tests: No results for input(s): TSH, T4TOTAL, FREET4, T3FREE, THYROIDAB in the last 72 hours.  Anemia Panel: No results for input(s): VITAMINB12, FOLATE, FERRITIN, TIBC, IRON, RETICCTPCT in the last 72 hours. Sepsis Labs: No results for input(s): PROCALCITON, LATICACIDVEN in the last 168 hours.   Recent Results (from the past 240 hour(s))  Resp Panel by RT-PCR (Flu A&B, Covid) Urine, Unspecified Source     Status: None   Collection Time: 11/02/20  4:13 PM   Specimen: Urine, Unspecified Source; Nasopharyngeal(NP) swabs in vial transport medium  Result Value Ref Range Status   SARS Coronavirus 2 by RT PCR NEGATIVE NEGATIVE Final    Comment: (NOTE) SARS-CoV-2 target nucleic acids are NOT DETECTED.  The SARS-CoV-2 RNA is generally detectable in upper respiratory specimens during the acute phase of infection. The lowest concentration of SARS-CoV-2 viral copies this assay can detect is 138 copies/mL. A negative result does not preclude SARS-Cov-2 infection and should not be used as the sole basis for treatment or other patient management decisions. A negative result may occur with  improper specimen collection/handling, submission of specimen other than nasopharyngeal swab, presence of viral mutation(s) within the areas  targeted by this assay, and inadequate number of viral copies(<138 copies/mL). A negative result must be combined with clinical observations, patient history, and epidemiological information. The expected result is Negative.  Fact Sheet for Patients:  BloggerCourse.com  Fact Sheet for Healthcare Providers:  SeriousBroker.it  This test is no t yet approved or cleared by the [place] FDA and  has been authorized for detection and/or diagnosis of SARS-CoV-2 by FDA under an Emergency Use Authorization (EUA). This EUA will remain  in effect (meaning this test can be used) for the duration of the COVID-19 declaration under Section 564(b)(1) of the Act, 21 U.S.C.section 360bbb-3(b)(1), unless the authorization is terminated  or revoked sooner.       Influenza A by PCR NEGATIVE NEGATIVE Final   Influenza B by PCR NEGATIVE NEGATIVE Final    Comment: (NOTE) The Xpert Xpress SARS-CoV-2/FLU/RSV plus assay is intended as an aid in the diagnosis of influenza from Nasopharyngeal swab specimens and should not be used as a sole basis for treatment. Nasal washings and aspirates are unacceptable for Xpert Xpress SARS-CoV-2/FLU/RSV testing.  Fact Sheet for Patients: BloggerCourse.com  Fact Sheet for Healthcare Providers: SeriousBroker.it  This test is not yet approved or cleared by the [place] FDA and has been authorized for detection and/or diagnosis of SARS-CoV-2 by FDA under an Emergency Use Authorization (EUA). This EUA will remain in effect (meaning this test can be used) for the duration of the COVID-19 declaration under Section 564(b)(1) of the Act, 21 U.S.C. section 360bbb-3(b)(1), unless the authorization is terminated or revoked.  Performed at [facility], [address] [name], [place] [address]   Blood culture (routine x 2)     Status: None    Collection Time: 11/02/20  8:00 PM   Specimen: BLOOD  Result Value Ref Range Status   Specimen Description   Final    BLOOD BLOOD RIGHT HAND Performed at [facility], [address], [place], [place] [address]    Special Requests   Final    BOTTLES DRAWN AEROBIC AND ANAEROBIC Blood Culture adequate volume Performed at [facility], [address], [place], [place] [address]    Culture   Final    NO GROWTH 5 DAYS Performed at [facility], [address], [place], [place] [address]    Report Status 11/08/2020 FINAL  Final  Blood culture (routine x 2)     Status: None   Collection Time: 11/02/20  8:10 PM   Specimen: BLOOD  Result Value Ref Range Status   Specimen Description   Final    BLOOD BLOOD LEFT HAND Performed at [facility], [address] [place], [place] [address]    Special Requests   Final    BOTTLES DRAWN AEROBIC ONLY Blood Culture results may not be optimal due to an inadequate volume of blood received in culture bottles Performed at [facility], [address] [place], [place] [address]    Culture   Final    NO GROWTH 5 DAYS Performed at [facility], [address], [place], [place] [address]    Report Status 11/08/2020 FINAL  Final  Surgical pcr screen     Status: None   Collection Time: 11/03/20  2:14 PM   Specimen: Nasal Mucosa; Nasal Swab  Result Value Ref Range Status   MRSA, PCR NEGATIVE NEGATIVE Final   Staphylococcus aureus NEGATIVE NEGATIVE Final    Comment: (NOTE) The Xpert SA Assay (FDA approved for NASAL specimens in patients 37 years of age and older), is one component of a comprehensive surveillance program. It is not intended to diagnose infection nor to guide or monitor treatment. Performed at [facility], [address] [place], [place] [address]   Aerobic/Anaerobic Culture w Gram Stain (surgical/deep wound)     Status: None   Collection  Time: 11/03/20  3:49 PM   Specimen: Abscess  Result Value Ref Range Status   Specimen Description ABSCESS  Final   Special Requests LUMBAR EPIDURAL ABSCESS SPEC A  Final   Gram Stain   Final    RARE WBC PRESENT, PREDOMINANTLY PMN NO ORGANISMS SEEN    Culture   Final    RARE PSEUDOMONAS AERUGINOSA NO ANAEROBES ISOLATED Performed at [facility], [address], [place], [place] [address]    Report Status 11/08/2020 FINAL  Final   Organism ID, Bacteria PSEUDOMONAS AERUGINOSA  Final      Susceptibility   Pseudomonas aeruginosa - MIC*    CEFTAZIDIME 4 SENSITIVE Sensitive     CIPROFLOXACIN <=0.25 SENSITIVE Sensitive     GENTAMICIN <=1 SENSITIVE Sensitive     IMIPENEM 1 SENSITIVE Sensitive     PIP/TAZO <=4 SENSITIVE Sensitive     CEFEPIME 2 SENSITIVE Sensitive     * RARE PSEUDOMONAS AERUGINOSA         Radiology Studies: No results found.      Scheduled Meds:  amLODipine  10 mg Oral Daily   ferrous sulfate  325 mg Oral Q breakfast   insulin aspart  0-9 Units Subcutaneous TID WC   lactulose  10 g Oral TID   lidocaine  1 patch Transdermal Q24H   melatonin  10 mg Oral QHS   metoprolol succinate  50 mg Oral Daily   nicotine  14 mg Transdermal Daily   pantoprazole  40 mg Oral Daily   sodium chloride  2 g Oral BID WC   sucralfate  1 g Oral TID WC & HS   Continuous Infusions:  ceFEPime (MAXIPIME) IV 2 g (11/11/20 0906)     LOS: 8 days    Time spent:25 mins,. More than 50% of that time was spent in counseling and/or coordination of care.      Burnadette Pop,  MD [facility] P8/15/2022, 12:06 PM

## 2020-11-12 LAB — GLUCOSE, CAPILLARY
Glucose-Capillary: 139 mg/dL — ABNORMAL HIGH (ref 70–99)
Glucose-Capillary: 120 mg/dL — ABNORMAL HIGH (ref 70–99)
Glucose-Capillary: 122 mg/dL — ABNORMAL HIGH (ref 70–99)
Glucose-Capillary: 112 mg/dL — ABNORMAL HIGH (ref 70–99)

## 2020-11-12 NOTE — Progress Notes (Signed)
PROGRESS NOTE    [name]  QIH:[id number] DOB: July 26, 1965 DOA: 11/02/2020 PCP: [name], MD   Chief Complain: Back pain  Brief Narrative:  Patient is a 55 year old male with recurrent discitis , admitted on 2021 for lumbar discitis with phlegmon requiring prolonged antibiotic course admitted this time for lower back pain for 2 days.  MRI showed epidural abscess, right-sided psoas myositis.  Neurosurgery, ID consulted.  Underwent L3-L4 laminectomies with evacuation of epidural abscess.  No further surgery planned by neurosurgery.  Currently on cefepime as per ID.  Plan for total of 2 weeks( from 8/7) of cefepime followed by ciprofloxacin for 6 weeks.  He needs to be in hospital for IV antibiotics because of history of drug abuse.  Assessment & Plan:   Principal Problem:   Epidural abscess Active Problems:   Benign essential HTN   L3-L4 discitis with osteomyelitis: Recurrent issue.  Presented with back pain.  MRI showed epidural abscess, right-sided psoas myositis.  Neurosurgery, ID consulted.  Underwent L3-L4 laminectomies with evacuation of epidural abscess.  No further surgery planned by neurosurgery.  Currently on cefepime as per ID.  Continue pain management, minimize IV narcotic use. Blood  Cultures remain negative so far.  Epidural abscess cultures showed Pseudomonas, sensitive to Cipro.   Plan for total of 2 weeks( from 8/7) of cefepime followed by ciprofloxacin for 6 weeks,last day 8/20 for iv abx.  He needs to be in hospital for IV antibiotics because of history of drug abuse.  Hyponatremia/hypokalemia: Sodium level improving with sodium tablets.  Potassium supplemented.Will check Bmp tomorrow  Hypertension: BP stable on metoprolol and amlodipine  Prediabetes: Blood sugars well controlled.  Hemoglobin A1c 5.9.  Continue sliding scale insulin.  History of schizophrenia: On Cogentin, Wellbutrin, gabapentin at home  as per med rec,these medications are on hold because of   confusion.As per the spouse he does not take gabapentin or Cogentin because it makes him confused.  Altered mental status: resolved.  Alert and oriented today  History of COPD: Continue as needed bronchodilators.  Currently on room air.  Sinus tachycardia: Continue to monitor,restarted home metoprolol.Monitor  History of alcohol use/cocaine use: UDS positive for cocaine, opiates.  He was on Librium taper.         DVT prophylaxis:SCD Code Status: Full Family Communication: Called and discussed with the significant other on phone on 11/08/10 Status is: Inpatient  Remains inpatient appropriate because:Inpatient level of care appropriate due to severity of illness  Dispo: The patient is from: Home              Anticipated d/c is to: Home              Patient currently is not medically stable to d/c.   Difficult to place patient No Needs to continue IV antibiotic till 8/20    Consultants: ID,neurosurgery  Procedures:As above  Antimicrobials:  Anti-infectives (From admission, onward)    Start     Dose/Rate Route Frequency Ordered Stop   11/04/20 1800  vancomycin (VANCOREADY) IVPB 750 mg/150 mL  Status:  Discontinued        750 mg 150 mL/hr over 60 Minutes Intravenous Every 8 hours 11/04/20 0911 11/06/20 0836   11/04/20 1000  vancomycin (VANCOREADY) IVPB 2000 mg/400 mL        2,000 mg 200 mL/hr over 120 Minutes Intravenous  Once 11/04/20 0905 11/04/20 1237   11/04/20 1000  ceFEPIme (MAXIPIME) 2 g in sodium chloride 0.9 % 100 mL IVPB  2 g 200 mL/hr over 30 Minutes Intravenous Every 8 hours 11/04/20 0905     11/03/20 1457  ceFAZolin (ANCEF) 2-4 GM/100ML-% IVPB       Note to Pharmacy: Samuella Cota   : cabinet override      11/03/20 1457 11/04/20 0259       Subjective:  Patient seen and examined at the bedside this morning.  Hemodynamically stable.  Continues to complain of back pain.  I suspect pain medication seeking behavior  Objective: Vitals:   11/11/20  1628 11/11/20 2112 11/12/20 0057 11/12/20 0345  BP: 111/81 117/79 124/88 (!) 137/95  Pulse: 100 89 93 89  Resp: 18 16 18 16   Temp: 98 F (36.7 C) 97.9 F (36.6 C) 97.8 F (36.6 C) 97.8 F (36.6 C)  TempSrc: Oral Oral  Oral  SpO2: 100% 100% 100% 100%  Weight:      Height:        Intake/Output Summary (Last 24 hours) at 11/12/2020 0734 Last data filed at 11/12/2020 0300 Gross per 24 hour  Intake --  Output 3800 ml  Net -3800 ml   Filed Weights   11/04/20 0356 11/05/20 0330 11/06/20 0336  Weight: 78.4 kg 78.2 kg 79.1 kg    Examination:  General exam: Overall comfortable, not in distress HEENT: PERRL Respiratory system:  no wheezes or crackles  Cardiovascular system: S1 & S2 heard, RRR.  Gastrointestinal system: Abdomen is nondistended, soft and nontender. Central nervous system: Alert and oriented Extremities: No edema, no clubbing ,no cyanosis Skin: No rashes, no ulcers,no icterus       Data Reviewed: I have personally reviewed following labs and imaging studies  CBC: Recent Labs  Lab 11/06/20 0146 11/07/20 0119 11/08/20 0352 11/11/20 0238  WBC 13.2* 14.5* 13.5* 11.3*  NEUTROABS  --   --  11.2* 8.4*  HGB 11.3* 10.8* 9.8* 11.0*  HCT 33.3* 32.2* 30.2* 32.7*  MCV 82.2 82.4 84.1 83.8  PLT 336 349 400 520*   Basic Metabolic Panel: Recent Labs  Lab 11/06/20 0146 11/07/20 0119 11/08/20 0352 11/09/20 0343 11/10/20 0528 11/11/20 0238  NA 122* 125* 129* 128* 131* 130*  K 3.7 3.3* 3.4* 3.0* 3.4* 3.8  CL 94* 96* 104 100 101 100  CO2 18* 18* 17* 19* 21* 22  GLUCOSE 122* 134* 108* 85 124* 110*  BUN 10 11 15 11 10 13   CREATININE 0.57* 0.58* 0.65 0.68 0.64 0.72  CALCIUM 8.6* 8.5* 8.3* 8.1* 8.8* 8.7*  MG 1.7 1.7 1.9  --   --   --    GFR: Estimated Creatinine Clearance: 104.3 mL/min (by C-G formula based on SCr of 0.72 mg/dL). Liver Function Tests: No results for input(s): AST, ALT, ALKPHOS, BILITOT, PROT, ALBUMIN in the last 168 hours.  No results for  input(s): LIPASE, AMYLASE in the last 168 hours. Recent Labs  Lab 11/07/20 1134  AMMONIA 40*   Coagulation Profile: No results for input(s): INR, PROTIME in the last 168 hours. Cardiac Enzymes: No results for input(s): CKTOTAL, CKMB, CKMBINDEX, TROPONINI in the last 168 hours. BNP (last 3 results) No results for input(s): PROBNP in the last 8760 hours. HbA1C: No results for input(s): HGBA1C in the last 72 hours. CBG: Recent Labs  Lab 11/11/20 0627 11/11/20 1200 11/11/20 1629 11/11/20 2140 11/12/20 0614  GLUCAP 130* 159* 129* 151* 139*   Lipid Profile: No results for input(s): CHOL, HDL, LDLCALC, TRIG, CHOLHDL, LDLDIRECT in the last 72 hours. Thyroid Function Tests: No results for input(s): TSH, T4TOTAL,  FREET4, T3FREE, THYROIDAB in the last 72 hours.  Anemia Panel: No results for input(s): VITAMINB12, FOLATE, FERRITIN, TIBC, IRON, RETICCTPCT in the last 72 hours. Sepsis Labs: No results for input(s): PROCALCITON, LATICACIDVEN in the last 168 hours.   Recent Results (from the past 240 hour(s))  Resp Panel by RT-PCR (Flu A&B, Covid) Urine, Unspecified Source     Status: None   Collection Time: 11/02/20  4:13 PM   Specimen: Urine, Unspecified Source; Nasopharyngeal(NP) swabs in vial transport medium  Result Value Ref Range Status   SARS Coronavirus 2 by RT PCR NEGATIVE NEGATIVE Final    Comment: (NOTE) SARS-CoV-2 target nucleic acids are NOT DETECTED.  The SARS-CoV-2 RNA is generally detectable in upper respiratory specimens during the acute phase of infection. The lowest concentration of SARS-CoV-2 viral copies this assay can detect is 138 copies/mL. A negative result does not preclude SARS-Cov-2 infection and should not be used as the sole basis for treatment or other patient management decisions. A negative result may occur with  improper specimen collection/handling, submission of specimen other than nasopharyngeal swab, presence of viral mutation(s) within  the areas targeted by this assay, and inadequate number of viral copies(<138 copies/mL). A negative result must be combined with clinical observations, patient history, and epidemiological information. The expected result is Negative.  Fact Sheet for Patients:  BloggerCourse.com  Fact Sheet for Healthcare Providers:  SeriousBroker.it  This test is no t yet approved or cleared by the [place] FDA and  has been authorized for detection and/or diagnosis of SARS-CoV-2 by FDA under an Emergency Use Authorization (EUA). This EUA will remain  in effect (meaning this test can be used) for the duration of the COVID-19 declaration under Section 564(b)(1) of the Act, 21 U.S.C.section 360bbb-3(b)(1), unless the authorization is terminated  or revoked sooner.       Influenza A by PCR NEGATIVE NEGATIVE Final   Influenza B by PCR NEGATIVE NEGATIVE Final    Comment: (NOTE) The Xpert Xpress SARS-CoV-2/FLU/RSV plus assay is intended as an aid in the diagnosis of influenza from Nasopharyngeal swab specimens and should not be used as a sole basis for treatment. Nasal washings and aspirates are unacceptable for Xpert Xpress SARS-CoV-2/FLU/RSV testing.  Fact Sheet for Patients: BloggerCourse.com  Fact Sheet for Healthcare Providers: SeriousBroker.it  This test is not yet approved or cleared by the [place] FDA and has been authorized for detection and/or diagnosis of SARS-CoV-2 by FDA under an Emergency Use Authorization (EUA). This EUA will remain in effect (meaning this test can be used) for the duration of the COVID-19 declaration under Section 564(b)(1) of the Act, 21 U.S.C. section 360bbb-3(b)(1), unless the authorization is terminated or revoked.  Performed at [facility], [address], [place], [place] [address]   Blood culture (routine x 2)     Status:  None   Collection Time: 11/02/20  8:00 PM   Specimen: BLOOD  Result Value Ref Range Status   Specimen Description   Final    BLOOD BLOOD RIGHT HAND Performed at [facility], [address], [place], [place] [address]    Special Requests   Final    BOTTLES DRAWN AEROBIC AND ANAEROBIC Blood Culture adequate volume Performed at [facility], [address], [place], [place] [address]    Culture   Final    NO GROWTH 5 DAYS Performed at [facility], [address], [place], [place] [address]    Report Status 11/08/2020  FINAL  Final  Blood culture (routine x 2)     Status: None   Collection Time: 11/02/20  8:10 PM   Specimen: BLOOD  Result Value Ref Range Status   Specimen Description   Final    BLOOD BLOOD LEFT HAND Performed at [facility], [address] [place], [place] [address]    Special Requests   Final    BOTTLES DRAWN AEROBIC ONLY Blood Culture results may not be optimal due to an inadequate volume of blood received in culture bottles Performed at [facility], [address], [place], [place] [address]    Culture   Final    NO GROWTH 5 DAYS Performed at [facility], [address], [place], [place] [address]    Report Status 11/08/2020 FINAL  Final  Surgical pcr screen     Status: None   Collection Time: 11/03/20  2:14 PM   Specimen: Nasal Mucosa; Nasal Swab  Result Value Ref Range Status   MRSA, PCR NEGATIVE NEGATIVE Final   Staphylococcus aureus NEGATIVE NEGATIVE Final    Comment: (NOTE) The Xpert SA Assay (FDA approved for NASAL specimens in patients 18 years of age and older), is one component of a comprehensive surveillance program. It is not intended to diagnose infection nor to guide or monitor treatment. Performed at [facility], [address], [place], [place] [address]   Aerobic/Anaerobic Culture w Gram Stain (surgical/deep wound)     Status: None    Collection Time: 11/03/20  3:49 PM   Specimen: Abscess  Result Value Ref Range Status   Specimen Description ABSCESS  Final   Special Requests LUMBAR EPIDURAL ABSCESS SPEC A  Final   Gram Stain   Final    RARE WBC PRESENT, PREDOMINANTLY PMN NO ORGANISMS SEEN    Culture   Final    RARE PSEUDOMONAS AERUGINOSA NO ANAEROBES ISOLATED Performed at [facility], [address] [place], [place] [address]    Report Status 11/08/2020 FINAL  Final   Organism ID, Bacteria PSEUDOMONAS AERUGINOSA  Final      Susceptibility   Pseudomonas aeruginosa - MIC*    CEFTAZIDIME 4 SENSITIVE Sensitive     CIPROFLOXACIN <=0.25 SENSITIVE Sensitive     GENTAMICIN <=1 SENSITIVE Sensitive     IMIPENEM 1 SENSITIVE Sensitive     PIP/TAZO <=4 SENSITIVE Sensitive     CEFEPIME 2 SENSITIVE Sensitive     * RARE PSEUDOMONAS AERUGINOSA         Radiology Studies: No results found.      Scheduled Meds:  amLODipine  10 mg Oral Daily   ferrous sulfate  325 mg Oral Q breakfast   insulin aspart  0-9 Units Subcutaneous TID WC   lactulose  10 g Oral TID   lidocaine  1 patch Transdermal Q24H   melatonin  10 mg Oral QHS   metoprolol succinate  50 mg Oral Daily   nicotine  14 mg Transdermal Daily   pantoprazole  40 mg Oral Daily   sodium chloride  2 g Oral BID WC   sucralfate  1 g Oral TID WC & HS   Continuous Infusions:  ceFEPime (MAXIPIME) IV 2 g (11/12/20 0128)     LOS: 9 days    Time spent:25 mins,. More than 50% of that time was spent in counseling and/or coordination of care.      Burnadette Pop, MD [facility] P8/16/2022, 7:34 AM

## 2020-11-13 LAB — BASIC METABOLIC PANEL
Anion gap: 9 (ref 5–15)
BUN: 16 mg/dL (ref 6–20)
CO2: 22 mmol/L (ref 22–32)
Calcium: 8.7 mg/dL — ABNORMAL LOW (ref 8.9–10.3)
Chloride: 99 mmol/L (ref 98–111)
Creatinine, Ser: 0.79 mg/dL (ref 0.61–1.24)
GFR, Estimated: >60 mL/min (ref >60)
Glucose, Bld: 113 mg/dL — ABNORMAL HIGH (ref 70–99)
Potassium: 3.8 mmol/L (ref 3.5–5.1)
Sodium: 130 mmol/L — ABNORMAL LOW (ref 135–145)

## 2020-11-13 LAB — GLUCOSE, CAPILLARY
Glucose-Capillary: 121 mg/dL — ABNORMAL HIGH (ref 70–99)
Glucose-Capillary: 156 mg/dL — ABNORMAL HIGH (ref 70–99)
Glucose-Capillary: 158 mg/dL — ABNORMAL HIGH (ref 70–99)
Glucose-Capillary: 140 mg/dL — ABNORMAL HIGH (ref 70–99)

## 2020-11-13 NOTE — Progress Notes (Signed)
PROGRESS NOTE    [name]  PPI:[id number] DOB: 09-11-1965 DOA: 11/02/2020 PCP: [name], MD   Chief Complain: Back pain  Brief Narrative:  Patient is a 55 year old male with recurrent discitis , admitted on 2021 for lumbar discitis with phlegmon requiring prolonged antibiotic course admitted this time for lower back pain for 2 days.  MRI showed epidural abscess, right-sided psoas myositis.  Neurosurgery, ID consulted.  Underwent L3-L4 laminectomies with evacuation of epidural abscess.  No further surgery planned by neurosurgery.  Currently on cefepime as per ID.  Plan for total of 2 weeks( from 8/7) of cefepime followed by ciprofloxacin for 6 weeks.  He needs to be in hospital for IV antibiotics because of history of drug abuse.  Assessment & Plan:   Principal Problem:   Epidural abscess Active Problems:   Benign essential HTN   L3-L4 discitis with osteomyelitis: Recurrent issue.  Presented with back pain.  MRI showed epidural abscess, right-sided psoas myositis.  Neurosurgery, ID consulted.  Underwent L3-L4 laminectomies with evacuation of epidural abscess.  No further surgery planned by neurosurgery.  Currently on cefepime as per ID.  Continue pain management, minimize IV narcotic use. Blood  Cultures remain negative so far.  Epidural abscess cultures showed Pseudomonas, sensitive to Cipro.   Plan for total of 2 weeks( from 8/7) of cefepime followed by ciprofloxacin for 6 weeks,last day 8/20 for iv abx.  He needs to be in hospital for IV antibiotics because of history of drug abuse.  Hyponatremia/hypokalemia: Sodium level improving with sodium tablets.  Potassium supplemented.Will check Bmp tomorrow  Hypertension: BP stable on metoprolol and amlodipine  Prediabetes: Blood sugars well controlled.  Hemoglobin A1c 5.9.  Continue sliding scale insulin.  History of schizophrenia: On Cogentin, Wellbutrin, gabapentin at home  as per med rec,these medications are on hold because of   confusion.As per the spouse he does not take gabapentin or Cogentin because it makes him confused.  Altered mental status: resolved.  Alert and oriented today  History of COPD: Continue as needed bronchodilators.  Currently on room air.  Sinus tachycardia: Continue to monitor,restarted home metoprolol.Monitor  History of alcohol use/cocaine use: UDS positive for cocaine, opiates.  He was on Librium taper.         DVT prophylaxis:SCD Code Status: Full Family Communication: Called and discussed with the significant other on phone on 11/08/10 Status is: Inpatient  Remains inpatient appropriate because:Inpatient level of care appropriate due to severity of illness  Dispo: The patient is from: Home              Anticipated d/c is to: Home              Patient currently is not medically stable to d/c.   Difficult to place patient No Needs to continue IV antibiotic till 8/20    Consultants: ID,neurosurgery  Procedures:As above  Antimicrobials:  Anti-infectives (From admission, onward)    Start     Dose/Rate Route Frequency Ordered Stop   11/04/20 1800  vancomycin (VANCOREADY) IVPB 750 mg/150 mL  Status:  Discontinued        750 mg 150 mL/hr over 60 Minutes Intravenous Every 8 hours 11/04/20 0911 11/06/20 0836   11/04/20 1000  vancomycin (VANCOREADY) IVPB 2000 mg/400 mL        2,000 mg 200 mL/hr over 120 Minutes Intravenous  Once 11/04/20 0905 11/04/20 1237   11/04/20 1000  ceFEPIme (MAXIPIME) 2 g in sodium chloride 0.9 % 100 mL IVPB  2 g 200 mL/hr over 30 Minutes Intravenous Every 8 hours 11/04/20 0905     11/03/20 1457  ceFAZolin (ANCEF) 2-4 GM/100ML-% IVPB       Note to Pharmacy: Samuella Cota   : cabinet override      11/03/20 1457 11/04/20 0259       Subjective:  Patient seen and examined the bedside this morning.  Hemodynamically stable.  Looks comfortable.  No new complaints today except for back pain  Objective: Vitals:   11/12/20 1548 11/12/20 2007  11/12/20 2344 11/13/20 0330  BP: 126/86 114/81 (!) 116/96 (!) 133/94  Pulse: 95 96 95 (!) 101  Resp: 16 18 16 17   Temp: 98.5 F (36.9 C) 99.1 F (37.3 C) 98.3 F (36.8 C) 98 F (36.7 C)  TempSrc: Oral Oral Oral Oral  SpO2: 100% 100% 100% 100%  Weight:      Height:        Intake/Output Summary (Last 24 hours) at 11/13/2020 0752 Last data filed at 11/12/2020 2300 Gross per 24 hour  Intake 120 ml  Output 3200 ml  Net -3080 ml   Filed Weights   11/04/20 0356 11/05/20 0330 11/06/20 0336  Weight: 78.4 kg 78.2 kg 79.1 kg    Examination:  General exam: Overall comfortable, not in distress HEENT: PERRL Respiratory system:  no wheezes or crackles  Cardiovascular system: S1 & S2 heard, RRR.  Gastrointestinal system: Abdomen is nondistended, soft and nontender. Central nervous system: Alert and oriented Extremities: No edema, no clubbing ,no cyanosis Skin: No rashes, no ulcers,no icterus       Data Reviewed: I have personally reviewed following labs and imaging studies  CBC: Recent Labs  Lab 11/07/20 0119 11/08/20 0352 11/11/20 0238  WBC 14.5* 13.5* 11.3*  NEUTROABS  --  11.2* 8.4*  HGB 10.8* 9.8* 11.0*  HCT 32.2* 30.2* 32.7*  MCV 82.4 84.1 83.8  PLT 349 400 520*   Basic Metabolic Panel: Recent Labs  Lab 11/07/20 0119 11/08/20 0352 11/09/20 0343 11/10/20 0528 11/11/20 0238 11/13/20 0049  NA 125* 129* 128* 131* 130* 130*  K 3.3* 3.4* 3.0* 3.4* 3.8 3.8  CL 96* 104 100 101 100 99  CO2 18* 17* 19* 21* 22 22  GLUCOSE 134* 108* 85 124* 110* 113*  BUN 11 15 11 10 13 16   CREATININE 0.58* 0.65 0.68 0.64 0.72 0.79  CALCIUM 8.5* 8.3* 8.1* 8.8* 8.7* 8.7*  MG 1.7 1.9  --   --   --   --    GFR: Estimated Creatinine Clearance: 104.3 mL/min (by C-G formula based on SCr of 0.79 mg/dL). Liver Function Tests: No results for input(s): AST, ALT, ALKPHOS, BILITOT, PROT, ALBUMIN in the last 168 hours.  No results for input(s): LIPASE, AMYLASE in the last 168 hours. Recent  Labs  Lab 11/07/20 1134  AMMONIA 40*   Coagulation Profile: No results for input(s): INR, PROTIME in the last 168 hours. Cardiac Enzymes: No results for input(s): CKTOTAL, CKMB, CKMBINDEX, TROPONINI in the last 168 hours. BNP (last 3 results) No results for input(s): PROBNP in the last 8760 hours. HbA1C: No results for input(s): HGBA1C in the last 72 hours. CBG: Recent Labs  Lab 11/12/20 0614 11/12/20 1148 11/12/20 1546 11/12/20 2144 11/13/20 0637  GLUCAP 139* 120* 122* 112* 121*   Lipid Profile: No results for input(s): CHOL, HDL, LDLCALC, TRIG, CHOLHDL, LDLDIRECT in the last 72 hours. Thyroid Function Tests: No results for input(s): TSH, T4TOTAL, FREET4, T3FREE, THYROIDAB in the last 72 hours.  Anemia Panel: No results for input(s): VITAMINB12, FOLATE, FERRITIN, TIBC, IRON, RETICCTPCT in the last 72 hours. Sepsis Labs: No results for input(s): PROCALCITON, LATICACIDVEN in the last 168 hours.   Recent Results (from the past 240 hour(s))  Surgical pcr screen     Status: None   Collection Time: 11/03/20  2:14 PM   Specimen: Nasal Mucosa; Nasal Swab  Result Value Ref Range Status   MRSA, PCR NEGATIVE NEGATIVE Final   Staphylococcus aureus NEGATIVE NEGATIVE Final    Comment: (NOTE) The Xpert SA Assay (FDA approved for NASAL specimens in patients 37 years of age and older), is one component of a comprehensive surveillance program. It is not intended to diagnose infection nor to guide or monitor treatment. Performed at [facility], [address], [place], [place] [address]   Aerobic/Anaerobic Culture w Gram Stain (surgical/deep wound)     Status: None   Collection Time: 11/03/20  3:49 PM   Specimen: Abscess  Result Value Ref Range Status   Specimen Description ABSCESS  Final   Special Requests LUMBAR EPIDURAL ABSCESS SPEC A  Final   Gram Stain   Final    RARE WBC PRESENT, PREDOMINANTLY PMN NO ORGANISMS SEEN    Culture   Final    RARE PSEUDOMONAS  AERUGINOSA NO ANAEROBES ISOLATED Performed at [facility], [address], [place], [place] [address]    Report Status 11/08/2020 FINAL  Final   Organism ID, Bacteria PSEUDOMONAS AERUGINOSA  Final      Susceptibility   Pseudomonas aeruginosa - MIC*    CEFTAZIDIME 4 SENSITIVE Sensitive     CIPROFLOXACIN <=0.25 SENSITIVE Sensitive     GENTAMICIN <=1 SENSITIVE Sensitive     IMIPENEM 1 SENSITIVE Sensitive     PIP/TAZO <=4 SENSITIVE Sensitive     CEFEPIME 2 SENSITIVE Sensitive     * RARE PSEUDOMONAS AERUGINOSA         Radiology Studies: No results found.      Scheduled Meds:  amLODipine  10 mg Oral Daily   ferrous sulfate  325 mg Oral Q breakfast   insulin aspart  0-9 Units Subcutaneous TID WC   lactulose  10 g Oral TID   lidocaine  1 patch Transdermal Q24H   melatonin  10 mg Oral QHS   metoprolol succinate  50 mg Oral Daily   nicotine  14 mg Transdermal Daily   pantoprazole  40 mg Oral Daily   sodium chloride  2 g Oral BID WC   sucralfate  1 g Oral TID WC & HS   Continuous Infusions:  ceFEPime (MAXIPIME) IV 2 g (11/13/20 0147)     LOS: 10 days    Time spent:25 mins,. More than 50% of that time was spent in counseling and/or coordination of care.      Burnadette Pop, MD [facility] P8/17/2022, 7:52 AM

## 2020-11-14 ENCOUNTER — Ambulatory Visit: Payer: Medicare Other | Admitting: Specialist

## 2020-11-14 LAB — GLUCOSE, CAPILLARY
Glucose-Capillary: 117 mg/dL — ABNORMAL HIGH (ref 70–99)
Glucose-Capillary: 154 mg/dL — ABNORMAL HIGH (ref 70–99)
Glucose-Capillary: 143 mg/dL — ABNORMAL HIGH (ref 70–99)
Glucose-Capillary: 123 mg/dL — ABNORMAL HIGH (ref 70–99)

## 2020-11-14 NOTE — Plan of Care (Signed)
  Problem: Education: Goal: Knowledge of General Education information will improve Description: Including pain rating scale, medication(s)/side effects and non-pharmacologic comfort measures Outcome: Progressing   Problem: Health Behavior/Discharge Planning: Goal: Ability to manage health-related needs will improve Outcome: Progressing   Problem: Clinical Measurements: Goal: Ability to maintain clinical measurements within normal limits will improve Outcome: Progressing Goal: Will remain free from infection Outcome: Progressing Goal: Diagnostic test results will improve Outcome: Progressing Goal: Respiratory complications will improve Outcome: Progressing Goal: Cardiovascular complication will be avoided Outcome: Progressing   Problem: Activity: Goal: Risk for activity intolerance will decrease Outcome: Progressing   Problem: Nutrition: Goal: Adequate nutrition will be maintained Outcome: Progressing   Problem: Coping: Goal: Level of anxiety will decrease Outcome: Progressing   Problem: Elimination: Goal: Will not experience complications related to bowel motility Outcome: Progressing Goal: Will not experience complications related to urinary retention Outcome: Progressing   Problem: Pain Managment: Goal: General experience of comfort will improve Outcome: Progressing   Problem: Safety: Goal: Ability to remain free from injury will improve Outcome: Progressing   Problem: Skin Integrity: Goal: Risk for impaired skin integrity will decrease Outcome: Progressing   Problem: Education: Goal: Ability to verbalize activity precautions or restrictions will improve Outcome: Progressing Goal: Knowledge of the prescribed therapeutic regimen will improve Outcome: Progressing Goal: Understanding of discharge needs will improve Outcome: Progressing   Problem: Activity: Goal: Ability to avoid complications of mobility impairment will improve Outcome: Progressing Goal:  Ability to tolerate increased activity will improve Outcome: Progressing Goal: Will remain free from falls Outcome: Progressing   Problem: Bowel/Gastric: Goal: Gastrointestinal status for postoperative course will improve Outcome: Progressing   Problem: Clinical Measurements: Goal: Ability to maintain clinical measurements within normal limits will improve Outcome: Progressing Goal: Postoperative complications will be avoided or minimized Outcome: Progressing Goal: Diagnostic test results will improve Outcome: Progressing   Problem: Pain Management: Goal: Pain level will decrease Outcome: Progressing   Problem: Skin Integrity: Goal: Will show signs of wound healing Outcome: Progressing   Problem: Health Behavior/Discharge Planning: Goal: Identification of resources available to assist in meeting health care needs will improve Outcome: Progressing   Problem: Bladder/Genitourinary: Goal: Urinary functional status for postoperative course will improve Description: Voiding well in large amts.   Outcome: Progressing

## 2020-11-14 NOTE — Progress Notes (Signed)
PROGRESS NOTE    [name]  YNW:[id number] DOB: 03/06/1966 DOA: 11/02/2020 PCP: [name], MD   Chief Complain: Back pain  Brief Narrative:  Patient is a 55 year old male with recurrent discitis , admitted on 2021 for lumbar discitis with phlegmon requiring prolonged antibiotic course admitted this time for lower back pain for 2 days.  MRI showed epidural abscess, right-sided psoas myositis.  Neurosurgery, ID consulted.  Underwent L3-L4 laminectomies with evacuation of epidural abscess.  No further surgery planned by neurosurgery.  Currently on cefepime as per ID.  ID recommended  total of 2 weeks( from 8/7 through 8/20) of cefepime followed by ciprofloxacin for 6 weeks.  He needs to be in hospital for IV antibiotics because of history of drug abuse.He will finish abx course on 8/20.  Assessment & Plan:   Principal Problem:   Epidural abscess Active Problems:   Benign essential HTN   L3-L4 discitis with osteomyelitis: Recurrent issue.  Presented with back pain.  MRI showed epidural abscess, right-sided psoas myositis.  Neurosurgery, ID consulted.  Underwent L3-L4 laminectomies with evacuation of epidural abscess.  No further surgery planned by neurosurgery.  Currently on cefepime as per ID.  Continue pain management, minimize IV narcotic use. Blood  Cultures remain negative so far.  Epidural abscess cultures showed Pseudomonas, sensitive to Cipro.   Plan for total of 2 weeks( from 8/7) of cefepime followed by ciprofloxacin for 6 weeks,last day is 8/20 for iv abx.  He needs to be in hospital for IV antibiotics until that period because of history of drug abuse.  Hyponatremia/hypokalemia: Sodium level improved  with sodium tablets.  Potassium supplemented.Intermittently monitored  Hypertension: BP stable on metoprolol and amlodipine  Prediabetes: Blood sugars well controlled.  Hemoglobin A1c 5.9.  Continue sliding scale insulin.  History of schizophrenia: On Cogentin, Wellbutrin,  gabapentin at home  as per med rec,these medications are on hold because of  confusion.As per the spouse he does not take gabapentin or Cogentin because it makes him confused.  Altered mental status: Likely from excessive narcotics.Now resolved.  Alert and oriented today  History of COPD: Continue as needed bronchodilators.  Currently on room air.  Sinus tachycardia:Restarted home metoprolol . H.O chronic inappropriate sinus tachycardia. TSH normal  History of alcohol use/cocaine use: UDS positive for cocaine, opiates.  He was treated with Librium taper.         DVT prophylaxis:SCD Code Status: Full Family Communication: Called and discussed with the significant other on phone on 11/08/10 Status is: Inpatient  Remains inpatient appropriate because:Inpatient level of care appropriate due to severity of illness  Dispo: The patient is from: Home              Anticipated d/c is to: Home              Patient currently is not medically stable to d/c.   Difficult to place patient No Needs to continue IV antibiotic till 8/20    Consultants: ID,neurosurgery  Procedures:As above  Antimicrobials:  Anti-infectives (From admission, onward)    Start     Dose/Rate Route Frequency Ordered Stop   11/04/20 1800  vancomycin (VANCOREADY) IVPB 750 mg/150 mL  Status:  Discontinued        750 mg 150 mL/hr over 60 Minutes Intravenous Every 8 hours 11/04/20 0911 11/06/20 0836   11/04/20 1000  vancomycin (VANCOREADY) IVPB 2000 mg/400 mL        2,000 mg 200 mL/hr over 120 Minutes Intravenous  Once 11/04/20  3875 11/04/20 1237   11/04/20 1000  ceFEPIme (MAXIPIME) 2 g in sodium chloride 0.9 % 100 mL IVPB        2 g 200 mL/hr over 30 Minutes Intravenous Every 8 hours 11/04/20 0905     11/03/20 1457  ceFAZolin (ANCEF) 2-4 GM/100ML-% IVPB       Note to Pharmacy: Samuella Cota   : cabinet override      11/03/20 1457 11/04/20 0259       Subjective:  Patient seen and examined the bedside this  morning.  He was comfortable during my evaluation.  He always asks to increase his pain medication complaining of back pain.  Had discussion at the bedside that we are not increasing the pain medication at the moment.  Was not in any kind of distress from pain.  Objective: Vitals:   11/13/20 1224 11/13/20 1549 11/13/20 2015 11/14/20 0009  BP: 113/72 114/77 112/71 107/78  Pulse: 100 100 100 97  Resp: 16 20 18 18   Temp: 98.2 F (36.8 C) 98.2 F (36.8 C) 98.6 F (37 C) 98.1 F (36.7 C)  TempSrc: Oral Oral Oral Oral  SpO2: 99% 100% 100% 99%  Weight:      Height:        Intake/Output Summary (Last 24 hours) at 11/14/2020 0721 Last data filed at 11/14/2020 0300 Gross per 24 hour  Intake 240 ml  Output 2500 ml  Net -2260 ml   Filed Weights   11/04/20 0356 11/05/20 0330 11/06/20 0336  Weight: 78.4 kg 78.2 kg 79.1 kg    Examination:  General exam: Overall comfortable, not in distress HEENT: PERRL Respiratory system:  no wheezes or crackles  Cardiovascular system: S1 & S2 heard, RRR. Sinus tach Gastrointestinal system: Abdomen is nondistended, soft and nontender. Central nervous system: Alert and oriented Extremities: No edema, no clubbing ,no cyanosis Skin: No rashes, no ulcers,no icterus  , clean laminectomy surgical scar on the lower back    Data Reviewed: I have personally reviewed following labs and imaging studies  CBC: Recent Labs  Lab 11/08/20 0352 11/11/20 0238  WBC 13.5* 11.3*  NEUTROABS 11.2* 8.4*  HGB 9.8* 11.0*  HCT 30.2* 32.7*  MCV 84.1 83.8  PLT 400 520*   Basic Metabolic Panel: Recent Labs  Lab 11/08/20 0352 11/09/20 0343 11/10/20 0528 11/11/20 0238 11/13/20 0049  NA 129* 128* 131* 130* 130*  K 3.4* 3.0* 3.4* 3.8 3.8  CL 104 100 101 100 99  CO2 17* 19* 21* 22 22  GLUCOSE 108* 85 124* 110* 113*  BUN 15 11 10 13 16   CREATININE 0.65 0.68 0.64 0.72 0.79  CALCIUM 8.3* 8.1* 8.8* 8.7* 8.7*  MG 1.9  --   --   --   --    GFR: Estimated  Creatinine Clearance: 104.3 mL/min (by C-G formula based on SCr of 0.79 mg/dL). Liver Function Tests: No results for input(s): AST, ALT, ALKPHOS, BILITOT, PROT, ALBUMIN in the last 168 hours.  No results for input(s): LIPASE, AMYLASE in the last 168 hours. Recent Labs  Lab 11/07/20 1134  AMMONIA 40*   Coagulation Profile: No results for input(s): INR, PROTIME in the last 168 hours. Cardiac Enzymes: No results for input(s): CKTOTAL, CKMB, CKMBINDEX, TROPONINI in the last 168 hours. BNP (last 3 results) No results for input(s): PROBNP in the last 8760 hours. HbA1C: No results for input(s): HGBA1C in the last 72 hours. CBG: Recent Labs  Lab 11/13/20 6433 11/13/20 1223 11/13/20 1552 11/13/20 2126 11/14/20 2951  GLUCAP 121* 156* 158* 140* 117*   Lipid Profile: No results for input(s): CHOL, HDL, LDLCALC, TRIG, CHOLHDL, LDLDIRECT in the last 72 hours. Thyroid Function Tests: No results for input(s): TSH, T4TOTAL, FREET4, T3FREE, THYROIDAB in the last 72 hours.  Anemia Panel: No results for input(s): VITAMINB12, FOLATE, FERRITIN, TIBC, IRON, RETICCTPCT in the last 72 hours. Sepsis Labs: No results for input(s): PROCALCITON, LATICACIDVEN in the last 168 hours.   No results found for this or any previous visit (from the past 240 hour(s)).        Radiology Studies: No results found.      Scheduled Meds:  amLODipine  10 mg Oral Daily   ferrous sulfate  325 mg Oral Q breakfast   insulin aspart  0-9 Units Subcutaneous TID WC   lactulose  10 g Oral TID   lidocaine  1 patch Transdermal Q24H   melatonin  10 mg Oral QHS   metoprolol succinate  50 mg Oral Daily   nicotine  14 mg Transdermal Daily   pantoprazole  40 mg Oral Daily   sodium chloride  2 g Oral BID WC   sucralfate  1 g Oral TID WC & HS   Continuous Infusions:  ceFEPime (MAXIPIME) IV 2 g (11/14/20 0325)     LOS: 11 days    Time spent:25 mins,. More than 50% of that time was spent in counseling and/or  coordination of care.      Burnadette Pop, MD [facility] P8/18/2022, 7:21 AM

## 2020-11-15 LAB — BASIC METABOLIC PANEL
Anion gap: 9 (ref 5–15)
BUN: 13 mg/dL (ref 6–20)
CO2: 21 mmol/L — ABNORMAL LOW (ref 22–32)
Calcium: 8.7 mg/dL — ABNORMAL LOW (ref 8.9–10.3)
Chloride: 101 mmol/L (ref 98–111)
Creatinine, Ser: 0.71 mg/dL (ref 0.61–1.24)
GFR, Estimated: >60 mL/min (ref >60)
Glucose, Bld: 127 mg/dL — ABNORMAL HIGH (ref 70–99)
Potassium: 3.8 mmol/L (ref 3.5–5.1)
Sodium: 131 mmol/L — ABNORMAL LOW (ref 135–145)

## 2020-11-15 LAB — GLUCOSE, CAPILLARY
Glucose-Capillary: 112 mg/dL — ABNORMAL HIGH (ref 70–99)
Glucose-Capillary: 134 mg/dL — ABNORMAL HIGH (ref 70–99)
Glucose-Capillary: 116 mg/dL — ABNORMAL HIGH (ref 70–99)

## 2020-11-15 NOTE — Progress Notes (Signed)
PROGRESS NOTE    [name]  ZOX:[id number] DOB: 09/06/1965 DOA: 11/02/2020 PCP: [name], MD   Chief Complain: Back pain  Brief Narrative:  Patient is a 55 year old male with recurrent discitis , admitted on 2021 for lumbar discitis with phlegmon requiring prolonged antibiotic course admitted this time for lower back pain for 2 days.  MRI showed epidural abscess, right-sided psoas myositis.  Neurosurgery, ID consulted.  Underwent L3-L4 laminectomies with evacuation of epidural abscess.  No further surgery planned by neurosurgery.  Currently on cefepime as per ID.  ID recommended  total of 2 weeks( from 8/7 through 8/20) of cefepime followed by ciprofloxacin for 6 weeks.  He needs to be in hospital for IV antibiotics because of history of drug abuse.He will finish abx course on 8/20.  11/15/2020: Patient seen.  Also discussed with patient's girlfriend.  The plan is to complete 2 weeks of IV antibiotics and then transition to oral antibiotics.  Patient WAS CONCERNED ABOUT PATIENT'S ABILITY TO AMBULATE ON DISCHARGE.  WE WILL CONSULT PHYSICAL THERAPY CONSIDERATION.  PATIENT WAS PERLOV SECONDARY TO BE OPTIMIZED.  Assessment & Plan:   Principal Problem:   Epidural abscess Active Problems:   Benign essential HTN   L3-L4 discitis with osteomyelitis: Recurrent issue.  Presented with back pain.  MRI showed epidural abscess, right-sided psoas myositis.  Neurosurgery, ID consulted.  Underwent L3-L4 laminectomies with evacuation of epidural abscess.  No further surgery planned by neurosurgery.  Currently on cefepime as per ID.  Continue pain management, minimize IV narcotic use. Blood  Cultures remain negative so far.  Epidural abscess cultures showed Pseudomonas, sensitive to Cipro.   Plan for total of 2 weeks( from 8/8) of cefepime followed by ciprofloxacin for 6 weeks,last day is 8/21 for iv abx.  He needs to be in hospital for IV antibiotics until that period because of history of drug  abuse. 11/15/2020: Complete 2 weeks of IV antibiotics and then transition to oral antibiotics.  Hyponatremia/hypokalemia:  Sodium level improved  with sodium tablets.  Potassium supplemented.Intermittently monitored 11/15/2020: Prior work-up is more consistent with SIADH.  We will repeat urine sodium and serum osmolality in the morning. -Patient is also on as needed NSAIDs.  Hypertension: BP stable on metoprolol and amlodipine  Prediabetes: Blood sugars well controlled.  Hemoglobin A1c 5.9.  Continue sliding scale insulin.  History of schizophrenia: On Cogentin, Wellbutrin, gabapentin at home  as per med rec,these medications are on hold because of  confusion.As per the spouse he does not take gabapentin or Cogentin because it makes him confused.  Altered mental status: Likely from excessive narcotics.Now resolved.  Alert and oriented today  History of COPD: Continue as needed bronchodilators.  Currently on room air.  Sinus tachycardia:Restarted home metoprolol . H.O chronic inappropriate sinus tachycardia. TSH normal  History of alcohol use/cocaine use: UDS positive for cocaine, opiates.  He was treated with Librium taper.  DVT prophylaxis:SCD Code Status: Full Family Communication: Called and discussed with the significant other on phone on 11/08/10 Status is: Inpatient  Remains inpatient appropriate because:Inpatient level of care appropriate due to severity of illness  Dispo: The patient is from: Home              Anticipated d/c is to: Home              Patient currently is not medically stable to d/c.   Difficult to place patient No Needs to continue IV antibiotic till 8/20    Consultants: ID,neurosurgery  Procedures:As  above  Antimicrobials:  Anti-infectives (From admission, onward)    Start     Dose/Rate Route Frequency Ordered Stop   11/04/20 1800  vancomycin (VANCOREADY) IVPB 750 mg/150 mL  Status:  Discontinued        750 mg 150 mL/hr over 60 Minutes Intravenous  Every 8 hours 11/04/20 0911 11/06/20 0836   11/04/20 1000  vancomycin (VANCOREADY) IVPB 2000 mg/400 mL        2,000 mg 200 mL/hr over 120 Minutes Intravenous  Once 11/04/20 0905 11/04/20 1237   11/04/20 1000  ceFEPIme (MAXIPIME) 2 g in sodium chloride 0.9 % 100 mL IVPB        2 g 200 mL/hr over 30 Minutes Intravenous Every 8 hours 11/04/20 0905     11/03/20 1457  ceFAZolin (ANCEF) 2-4 GM/100ML-% IVPB       Note to Pharmacy: Samuella Cota   : cabinet override      11/03/20 1457 11/04/20 0259       Subjective:  Patient seen. Patient has no new complaints.    Objective: Vitals:   11/15/20 0500 11/15/20 0746 11/15/20 1119 11/15/20 1514  BP: 121/78 124/83 131/90 116/87  Pulse: 100 99 (!) 110 (!) 107  Resp:  16 16 18   Temp: 99 F (37.2 C) 99.1 F (37.3 C) 98.9 F (37.2 C) 98.6 F (37 C)  TempSrc: Oral Oral Oral Oral  SpO2: 100% 100% 100% 100%  Weight:      Height:        Intake/Output Summary (Last 24 hours) at 11/15/2020 1658 Last data filed at 11/15/2020 0800 Gross per 24 hour  Intake --  Output 3150 ml  Net -3150 ml    Filed Weights   11/04/20 0356 11/05/20 0330 11/06/20 0336  Weight: 78.4 kg 78.2 kg 79.1 kg    Examination:  General exam: Overall comfortable, not in distress HEENT: PERRL Respiratory system:  no wheezes or crackles  Cardiovascular system: S1 & S2 heard, RRR. Sinus tach Gastrointestinal system: Abdomen is nondistended, soft and nontender. Central nervous system: Alert and oriented Extremities: No edema, no clubbing ,no cyanosis Skin: No rashes, no ulcers,no icterus  , clean laminectomy surgical scar on the lower back    Data Reviewed: I have personally reviewed following labs and imaging studies  CBC: Recent Labs  Lab 11/11/20 0238  WBC 11.3*  NEUTROABS 8.4*  HGB 11.0*  HCT 32.7*  MCV 83.8  PLT 520*    Basic Metabolic Panel: Recent Labs  Lab 11/09/20 0343 11/10/20 0528 11/11/20 0238 11/13/20 0049 11/15/20 0419  NA 128*  131* 130* 130* 131*  K 3.0* 3.4* 3.8 3.8 3.8  CL 100 101 100 99 101  CO2 19* 21* 22 22 21*  GLUCOSE 85 124* 110* 113* 127*  BUN 11 10 13 16 13   CREATININE 0.68 0.64 0.72 0.79 0.71  CALCIUM 8.1* 8.8* 8.7* 8.7* 8.7*    GFR: Estimated Creatinine Clearance: 104.3 mL/min (by C-G formula based on SCr of 0.71 mg/dL). Liver Function Tests: No results for input(s): AST, ALT, ALKPHOS, BILITOT, PROT, ALBUMIN in the last 168 hours.  No results for input(s): LIPASE, AMYLASE in the last 168 hours. No results for input(s): AMMONIA in the last 168 hours.  Coagulation Profile: No results for input(s): INR, PROTIME in the last 168 hours. Cardiac Enzymes: No results for input(s): CKTOTAL, CKMB, CKMBINDEX, TROPONINI in the last 168 hours. BNP (last 3 results) No results for input(s): PROBNP in the last 8760 hours. HbA1C: No results for  input(s): HGBA1C in the last 72 hours. CBG: Recent Labs  Lab 11/14/20 1201 11/14/20 1546 11/14/20 1741 11/15/20 0610 11/15/20 1622  GLUCAP 154* 143* 123* 112* 134*    Lipid Profile: No results for input(s): CHOL, HDL, LDLCALC, TRIG, CHOLHDL, LDLDIRECT in the last 72 hours. Thyroid Function Tests: No results for input(s): TSH, T4TOTAL, FREET4, T3FREE, THYROIDAB in the last 72 hours.  Anemia Panel: No results for input(s): VITAMINB12, FOLATE, FERRITIN, TIBC, IRON, RETICCTPCT in the last 72 hours. Sepsis Labs: No results for input(s): PROCALCITON, LATICACIDVEN in the last 168 hours.   No results found for this or any previous visit (from the past 240 hour(s)).        Radiology Studies: No results found.      Scheduled Meds:  amLODipine  10 mg Oral Daily   ferrous sulfate  325 mg Oral Q breakfast   insulin aspart  0-9 Units Subcutaneous TID WC   lactulose  10 g Oral TID   lidocaine  1 patch Transdermal Q24H   melatonin  10 mg Oral QHS   metoprolol succinate  50 mg Oral Daily   nicotine  14 mg Transdermal Daily   pantoprazole  40 mg Oral  Daily   sucralfate  1 g Oral TID WC & HS   Continuous Infusions:  ceFEPime (MAXIPIME) IV 2 g (11/15/20 1025)     LOS: 12 days    Time spent:25 mins,. More than 50% of that time was spent in counseling and/or coordination of care.      Barnetta Chapel, MD [facility] P8/19/2022, 4:58 PM

## 2020-11-15 NOTE — Plan of Care (Signed)
  Problem: Education: Goal: Knowledge of General Education information will improve Description: Including pain rating scale, medication(s)/side effects and non-pharmacologic comfort measures Outcome: Progressing   Problem: Health Behavior/Discharge Planning: Goal: Ability to manage health-related needs will improve Outcome: Progressing   Problem: Clinical Measurements: Goal: Ability to maintain clinical measurements within normal limits will improve Outcome: Progressing Goal: Will remain free from infection Outcome: Progressing Goal: Diagnostic test results will improve Outcome: Progressing Goal: Respiratory complications will improve Outcome: Progressing Goal: Cardiovascular complication will be avoided Outcome: Progressing   Problem: Activity: Goal: Risk for activity intolerance will decrease Outcome: Progressing   Problem: Nutrition: Goal: Adequate nutrition will be maintained Outcome: Progressing   Problem: Coping: Goal: Level of anxiety will decrease Outcome: Progressing   Problem: Elimination: Goal: Will not experience complications related to bowel motility Outcome: Progressing Goal: Will not experience complications related to urinary retention Outcome: Progressing   Problem: Pain Managment: Goal: General experience of comfort will improve Outcome: Progressing   Problem: Safety: Goal: Ability to remain free from injury will improve Outcome: Progressing   Problem: Skin Integrity: Goal: Risk for impaired skin integrity will decrease Outcome: Progressing   Problem: Education: Goal: Ability to verbalize activity precautions or restrictions will improve Outcome: Progressing Goal: Knowledge of the prescribed therapeutic regimen will improve Outcome: Progressing Goal: Understanding of discharge needs will improve Outcome: Progressing   Problem: Activity: Goal: Ability to avoid complications of mobility impairment will improve Outcome: Progressing Goal:  Ability to tolerate increased activity will improve Outcome: Progressing Goal: Will remain free from falls Outcome: Progressing   Problem: Bowel/Gastric: Goal: Gastrointestinal status for postoperative course will improve Outcome: Progressing   Problem: Clinical Measurements: Goal: Ability to maintain clinical measurements within normal limits will improve Outcome: Progressing Goal: Postoperative complications will be avoided or minimized Outcome: Progressing Goal: Diagnostic test results will improve Outcome: Progressing   Problem: Skin Integrity: Goal: Will show signs of wound healing Outcome: Progressing   Problem: Health Behavior/Discharge Planning: Goal: Identification of resources available to assist in meeting health care needs will improve Outcome: Progressing   Problem: Bladder/Genitourinary: Goal: Urinary functional status for postoperative course will improve Description: Voiding well in large amts.   Outcome: Progressing

## 2020-11-15 NOTE — Progress Notes (Signed)
Patient walked down the hallway to the nurse station with RN by side this evening using a four wheel rolling walker. Pt was fairly steady on his feet, relying on walker for support between steps, right side appeared weaker than left. Pt fatigued at the nursing station and was assisted back to his room.   Pt's family is asking PT/OT to provide or recommend a rolling walker for the patient to take home at discharge. RN informed family that PT/OT would have to make their recommendations after evaluating the patient.

## 2020-11-16 LAB — OSMOLALITY: Osmolality: 273 mOsm/kg — ABNORMAL LOW (ref 275–295)

## 2020-11-16 LAB — RENAL FUNCTION PANEL
Albumin: 2.5 g/dL — ABNORMAL LOW (ref 3.5–5.0)
Anion gap: 9 (ref 5–15)
BUN: 13 mg/dL (ref 6–20)
CO2: 22 mmol/L (ref 22–32)
Calcium: 8.6 mg/dL — ABNORMAL LOW (ref 8.9–10.3)
Chloride: 96 mmol/L — ABNORMAL LOW (ref 98–111)
Creatinine, Ser: 0.78 mg/dL (ref 0.61–1.24)
GFR, Estimated: >60 mL/min (ref >60)
Glucose, Bld: 102 mg/dL — ABNORMAL HIGH (ref 70–99)
Phosphorus: 4.1 mg/dL (ref 2.5–4.6)
Potassium: 3.8 mmol/L (ref 3.5–5.1)
Sodium: 127 mmol/L — ABNORMAL LOW (ref 135–145)

## 2020-11-16 LAB — GLUCOSE, CAPILLARY
Glucose-Capillary: 99 mg/dL (ref 70–99)
Glucose-Capillary: 139 mg/dL — ABNORMAL HIGH (ref 70–99)
Glucose-Capillary: 89 mg/dL (ref 70–99)
Glucose-Capillary: 136 mg/dL — ABNORMAL HIGH (ref 70–99)

## 2020-11-16 LAB — SODIUM, URINE, RANDOM: Sodium, Ur: 17 mmol/L

## 2020-11-16 LAB — OSMOLALITY, URINE: Osmolality, Ur: 265 mOsm/kg — ABNORMAL LOW (ref 300–900)

## 2020-11-16 NOTE — Evaluation (Signed)
Physical Therapy Evaluation Patient Details Name: [name] MRN: [id number] DOB: [birth year] Today's Date: 11/16/2020   History of Present Illness  55 y/o male presented to [facility] ED on 8/6 with complaints of progressively worsening back pain x 3 days. MRI shows features concering for discitis involving L3-4 area with epidural abscess and features concerning for R sided psoas myositis. Patient s/p L3-4 laminectomies and evacuation of epidural abscess on 8/7. PMH of discitis April 2021, s/p laminectormy, schizophrenia, cocaine use, prior stroke, anemia  Clinical Impression  PTA, patient lives with fiance and reports independence with use of combination of SPC and standard Dijuan Sleeth. Patient presents with generalized weakness (R>L), impaired balance, decreased activity tolerance. Patient currently requiring min guard for ambulation with RW. Patient with intermittent R knee buckling due to R LE weakness. Patient will benefit from skilled PT services during acute stay to address listed deficits. Recommend HHPT and supervision for OOB mobility for safety at discharge to maximize functional mobility and safety.     Follow Up Recommendations Home health PT;Supervision for mobility/OOB    Equipment Recommendations  Rolling Azekiel Cremer with 5" wheels    Recommendations for Other Services       Precautions / Restrictions Precautions Precautions: Fall Precaution Comments: verbally reviewed BLT Restrictions Weight Bearing Restrictions: No      Mobility  Bed Mobility Overal bed mobility: Needs Assistance Bed Mobility: Supine to Sit;Sit to Supine     Supine to sit: Supervision Sit to supine: Min assist   General bed mobility comments: instructed patient on log roll technique but patient refused to follow directions. MinA to return to bed with assist for R LE    Transfers Overall transfer level: Needs assistance Equipment used: Rolling Analy Bassford (2 wheeled) Transfers: Sit to/from Stand Sit to Stand:  Supervision         General transfer comment: supervision for safety  Ambulation/Gait Ambulation/Gait assistance: Min guard Gait Distance (Feet): 40 Feet Assistive device: Rolling Kalil Woessner (2 wheeled) Gait Pattern/deviations: Step-to pattern;Decreased stride length;Decreased stance time - right;Trunk flexed Gait velocity: decreased   General Gait Details: slight R knee buckling intermittently throughout. reliant on RW support. Min guard for safety.  Stairs            Wheelchair Mobility    Modified Rankin (Stroke Patients Only)       Balance Overall balance assessment: Mild deficits observed, not formally tested                                           Pertinent Vitals/Pain Pain Assessment: 0-10 Pain Score: 9  (not in distress) Pain Location: back Pain Descriptors / Indicators: Constant Pain Intervention(s): Monitored during session    Home Living Family/patient expects to be discharged to:: Private residence Living Arrangements: Spouse/significant other Available Help at Discharge: Family;Available 24 hours/day Type of Home: Apartment Home Access: Level entry     Home Layout: One level Home Equipment: Valoree Agent - standard;Cane - single point      Prior Function Level of Independence: Independent with assistive device(s)         Comments: uses combination of cane and Leiah Giannotti. independent with ADLs.     Hand Dominance        Extremity/Trunk Assessment   Upper Extremity Assessment Upper Extremity Assessment: Generalized weakness    Lower Extremity Assessment Lower Extremity Assessment: Generalized weakness;RLE deficits/detail (hx of bilateral LE numbness) RLE  Deficits / Details: grossly 2/5 RLE Sensation: history of peripheral neuropathy    Cervical / Trunk Assessment Cervical / Trunk Assessment: Kyphotic;Other exceptions Cervical / Trunk Exceptions: recent lumbar sx  Communication   Communication: No difficulties   Cognition Arousal/Alertness: Awake/alert Behavior During Therapy: WFL for tasks assessed/performed Overall Cognitive Status: No family/caregiver present to determine baseline cognitive functioning                                 General Comments: fluctuating information provided during session. Patient with slow processing throughout      General Comments      Exercises     Assessment/Plan    PT Assessment Patient needs continued PT services  PT Problem List Decreased strength;Decreased activity tolerance;Decreased balance;Decreased mobility;Decreased knowledge of use of DME;Decreased safety awareness;Decreased knowledge of precautions       PT Treatment Interventions DME instruction;Gait training;Functional mobility training;Therapeutic activities;Therapeutic exercise;Balance training;Patient/family education    PT Goals (Current goals can be found in the Care Plan section)  Acute Rehab PT Goals Patient Stated Goal: to go home PT Goal Formulation: With patient Time For Goal Achievement: 11/30/20 Potential to Achieve Goals: Good    Frequency Min 4X/week   Barriers to discharge        Co-evaluation               AM-PAC PT "6 Clicks" Mobility  Outcome Measure Help needed turning from your back to your side while in a flat bed without using bedrails?: A Little Help needed moving from lying on your back to sitting on the side of a flat bed without using bedrails?: A Little Help needed moving to and from a bed to a chair (including a wheelchair)?: A Little Help needed standing up from a chair using your arms (e.g., wheelchair or bedside chair)?: A Little Help needed to walk in hospital room?: A Little Help needed climbing 3-5 steps with a railing? : A Little 6 Click Score: 18    End of Session Equipment Utilized During Treatment: Gait belt Activity Tolerance: Patient tolerated treatment well Patient left: in bed;with call bell/phone within  reach;with bed alarm set Nurse Communication: Mobility status PT Visit Diagnosis: Unsteadiness on feet (R26.81);Muscle weakness (generalized) (M62.81);Difficulty in walking, not elsewhere classified (R26.2)    Time: 4098-1191 PT Time Calculation (min) (ACUTE ONLY): 14 min   Charges:   PT Evaluation $PT Eval Moderate Complexity: 1 Mod          Loxley Cibrian A. Dan Humphreys PT, DPT Acute Rehabilitation Services Pager [phone] Office [phone]   Viviann Spare 11/16/2020, 9:13 AM

## 2020-11-16 NOTE — Progress Notes (Signed)
PROGRESS NOTE    [name]  [id number]:[id number] DOB: 1966-03-12 DOA: 11/02/2020 PCP: [name], MD   Chief Complain: Back pain  Brief Narrative:  Patient is a 55 year old male with recurrent discitis , admitted on 2021 for lumbar discitis with phlegmon requiring prolonged antibiotic course admitted this time for lower back pain for 2 days.  MRI showed epidural abscess, right-sided psoas myositis.  Neurosurgery, ID consulted.  Underwent L3-L4 laminectomies with evacuation of epidural abscess.  No further surgery planned by neurosurgery.  Currently on cefepime as per ID.  ID recommended  total of 2 weeks( from 8/7 through 8/20) of cefepime followed by ciprofloxacin for 6 weeks.  He needs to be in hospital for IV antibiotics because of history of drug abuse.He will finish abx course on 8/20.  11/15/2020: Patient seen.  Also discussed with patient's girlfriend.  The plan is to complete 2 weeks of IV antibiotics and then transition to oral antibiotics.  Patient WAS CONCERNED ABOUT PATIENT'S ABILITY TO AMBULATE ON DISCHARGE.  WE WILL CONSULT PHYSICAL THERAPY CONSIDERATION.  PATIENT WAS PERLOV SECONDARY TO BE OPTIMIZED.  11/16/2020:  Patient seen.  No new changes.  Sodium is 127 today.  Input from the physical therapy team is appreciated.  Patient will complete 2 weeks of IV antibiotics tomorrow.  Likely discharge tomorrow.  Assessment & Plan:   Principal Problem:   Epidural abscess Active Problems:   Benign essential HTN   L3-L4 discitis with osteomyelitis: Recurrent issue.  Presented with back pain.  MRI showed epidural abscess, right-sided psoas myositis.  Neurosurgery, ID consulted.  Underwent L3-L4 laminectomies with evacuation of epidural abscess.  No further surgery planned by neurosurgery.  Currently on cefepime as per ID.  Continue pain management, minimize IV narcotic use. Blood  Cultures remain negative so far.  Epidural abscess cultures showed Pseudomonas, sensitive to Cipro.   Plan  for total of 2 weeks( from 8/8) of cefepime followed by ciprofloxacin for 6 weeks,last day is 8/21 for iv abx.  He needs to be in hospital for IV antibiotics until that period because of history of drug abuse. 11/16/2020: Complete 2 weeks of IV antibiotics tomorrow and then transition to oral antibiotics.  Hyponatremia/hypokalemia:  Sodium level improved  with sodium tablets.  Potassium supplemented.Intermittently monitored 11/15/2020: Prior work-up is more consistent with SIADH.  We will repeat urine sodium and serum osmolality in the morning. -Patient is also on as needed NSAIDs and opiate.  Hypertension: BP stable on metoprolol and amlodipine  Prediabetes: Blood sugars well controlled.  Hemoglobin A1c 5.9.  Continue sliding scale insulin.  History of schizophrenia: On Cogentin, Wellbutrin, gabapentin at home  as per med rec,these medications are on hold because of  confusion.As per the spouse he does not take gabapentin or Cogentin because it makes him confused.  Altered mental status: Likely from excessive narcotics.  Now resolved.  Alert and oriented today  History of COPD: Continue as needed bronchodilators.  Currently on room air.  Sinus tachycardia:  Restarted home metoprolol . H.O chronic inappropriate sinus tachycardia. TSH normal  History of alcohol use/cocaine use: UDS positive for cocaine, opiates.  He was treated with Librium taper.  DVT prophylaxis:SCD Code Status: Full Family Communication: Called and discussed with the significant other on phone on 11/08/10 Status is: Inpatient  Remains inpatient appropriate because:Inpatient level of care appropriate due to severity of illness  Dispo: The patient is from: Home              Anticipated d/c is  to: Home              Patient currently is not medically stable to d/c.   Difficult to place patient No Needs to continue IV antibiotic till 8/20    Consultants: ID,neurosurgery  Procedures:As above  Antimicrobials:   Anti-infectives (From admission, onward)    Start     Dose/Rate Route Frequency Ordered Stop   11/04/20 1800  vancomycin (VANCOREADY) IVPB 750 mg/150 mL  Status:  Discontinued        750 mg 150 mL/hr over 60 Minutes Intravenous Every 8 hours 11/04/20 0911 11/06/20 0836   11/04/20 1000  vancomycin (VANCOREADY) IVPB 2000 mg/400 mL        2,000 mg 200 mL/hr over 120 Minutes Intravenous  Once 11/04/20 0905 11/04/20 1237   11/04/20 1000  ceFEPIme (MAXIPIME) 2 g in sodium chloride 0.9 % 100 mL IVPB        2 g 200 mL/hr over 30 Minutes Intravenous Every 8 hours 11/04/20 0905     11/03/20 1457  ceFAZolin (ANCEF) 2-4 GM/100ML-% IVPB       Note to Pharmacy: Samuella Cota   : cabinet override      11/03/20 1457 11/04/20 0259       Subjective:  Patient seen. Patient has no new complaints.    Objective: Vitals:   11/15/20 1947 11/15/20 2251 11/16/20 0307 11/16/20 0756  BP: 104/74 98/70 110/76 108/82  Pulse: (!) 104 98 87 (!) 106  Resp: 20 16 16 17   Temp: 98.7 F (37.1 C) 98.9 F (37.2 C) 98.2 F (36.8 C) 98.2 F (36.8 C)  TempSrc: Oral Oral Oral Oral  SpO2: 99% 99% 100% 100%  Weight:      Height:        Intake/Output Summary (Last 24 hours) at 11/16/2020 1120 Last data filed at 11/16/2020 0900 Gross per 24 hour  Intake 1060 ml  Output 1800 ml  Net -740 ml    Filed Weights   11/04/20 0356 11/05/20 0330 11/06/20 0336  Weight: 78.4 kg 78.2 kg 79.1 kg    Examination:  General exam: Overall comfortable, not in distress HEENT: PERRL Respiratory system:  no wheezes or crackles  Cardiovascular system: S1 & S2 heard, RRR. Sinus tach Gastrointestinal system: Abdomen is nondistended, soft and nontender. Central nervous system: Alert and oriented Extremities: No edema, no clubbing ,no cyanosis Skin: No rashes, no ulcers,no icterus  , clean laminectomy surgical scar on the lower back    Data Reviewed: I have personally reviewed following labs and imaging  studies  CBC: Recent Labs  Lab 11/11/20 0238  WBC 11.3*  NEUTROABS 8.4*  HGB 11.0*  HCT 32.7*  MCV 83.8  PLT 520*    Basic Metabolic Panel: Recent Labs  Lab 11/10/20 0528 11/11/20 0238 11/13/20 0049 11/15/20 0419 11/16/20 0147  NA 131* 130* 130* 131* 127*  K 3.4* 3.8 3.8 3.8 3.8  CL 101 100 99 101 96*  CO2 21* 22 22 21* 22  GLUCOSE 124* 110* 113* 127* 102*  BUN 10 13 16 13 13   CREATININE 0.64 0.72 0.79 0.71 0.78  CALCIUM 8.8* 8.7* 8.7* 8.7* 8.6*  PHOS  --   --   --   --  4.1    GFR: Estimated Creatinine Clearance: 104.3 mL/min (by C-G formula based on SCr of 0.78 mg/dL). Liver Function Tests: Recent Labs  Lab 11/16/20 0147  ALBUMIN 2.5*    No results for input(s): LIPASE, AMYLASE in the last 168 hours. No  results for input(s): AMMONIA in the last 168 hours.  Coagulation Profile: No results for input(s): INR, PROTIME in the last 168 hours. Cardiac Enzymes: No results for input(s): CKTOTAL, CKMB, CKMBINDEX, TROPONINI in the last 168 hours. BNP (last 3 results) No results for input(s): PROBNP in the last 8760 hours. HbA1C: No results for input(s): HGBA1C in the last 72 hours. CBG: Recent Labs  Lab 11/14/20 1741 11/15/20 0610 11/15/20 1622 11/15/20 2144 11/16/20 0621  GLUCAP 123* 112* 134* 116* 99    Lipid Profile: No results for input(s): CHOL, HDL, LDLCALC, TRIG, CHOLHDL, LDLDIRECT in the last 72 hours. Thyroid Function Tests: No results for input(s): TSH, T4TOTAL, FREET4, T3FREE, THYROIDAB in the last 72 hours.  Anemia Panel: No results for input(s): VITAMINB12, FOLATE, FERRITIN, TIBC, IRON, RETICCTPCT in the last 72 hours. Sepsis Labs: No results for input(s): PROCALCITON, LATICACIDVEN in the last 168 hours.   No results found for this or any previous visit (from the past 240 hour(s)).        Radiology Studies: No results found.      Scheduled Meds:  amLODipine  10 mg Oral Daily   ferrous sulfate  325 mg Oral Q breakfast    insulin aspart  0-9 Units Subcutaneous TID WC   lactulose  10 g Oral TID   lidocaine  1 patch Transdermal Q24H   melatonin  10 mg Oral QHS   metoprolol succinate  50 mg Oral Daily   nicotine  14 mg Transdermal Daily   pantoprazole  40 mg Oral Daily   sucralfate  1 g Oral TID WC & HS   Continuous Infusions:  ceFEPime (MAXIPIME) IV 2 g (11/16/20 1013)     LOS: 13 days    Time spent:25 mins,. More than 50% of that time was spent in counseling and/or coordination of care.      Barnetta Chapel, MD [facility] P8/20/2022, 11:20 AM

## 2020-11-16 NOTE — Plan of Care (Signed)
  Problem: Education: Goal: Knowledge of General Education information will improve Description: Including pain rating scale, medication(s)/side effects and non-pharmacologic comfort measures Outcome: Progressing   Problem: Health Behavior/Discharge Planning: Goal: Ability to manage health-related needs will improve Outcome: Progressing   Problem: Clinical Measurements: Goal: Ability to maintain clinical measurements within normal limits will improve Outcome: Progressing Goal: Will remain free from infection Outcome: Progressing Goal: Diagnostic test results will improve Outcome: Progressing Goal: Respiratory complications will improve Outcome: Progressing Goal: Cardiovascular complication will be avoided Outcome: Progressing   Problem: Nutrition: Goal: Adequate nutrition will be maintained Outcome: Progressing   Problem: Coping: Goal: Level of anxiety will decrease Outcome: Progressing   Problem: Elimination: Goal: Will not experience complications related to bowel motility Outcome: Progressing Goal: Will not experience complications related to urinary retention Outcome: Progressing   Problem: Pain Managment: Goal: General experience of comfort will improve Outcome: Progressing   Problem: Safety: Goal: Ability to remain free from injury will improve Outcome: Progressing   Problem: Skin Integrity: Goal: Risk for impaired skin integrity will decrease Outcome: Progressing   Problem: Education: Goal: Ability to verbalize activity precautions or restrictions will improve Outcome: Progressing Goal: Knowledge of the prescribed therapeutic regimen will improve Outcome: Progressing Goal: Understanding of discharge needs will improve Outcome: Progressing   Problem: Activity: Goal: Ability to avoid complications of mobility impairment will improve Outcome: Progressing Goal: Ability to tolerate increased activity will improve Outcome: Progressing Goal: Will remain  free from falls Outcome: Progressing   Problem: Bowel/Gastric: Goal: Gastrointestinal status for postoperative course will improve Outcome: Progressing

## 2020-11-17 LAB — GLUCOSE, CAPILLARY
Glucose-Capillary: 104 mg/dL — ABNORMAL HIGH (ref 70–99)
Glucose-Capillary: 100 mg/dL — ABNORMAL HIGH (ref 70–99)
Glucose-Capillary: 111 mg/dL — ABNORMAL HIGH (ref 70–99)
Glucose-Capillary: 156 mg/dL — ABNORMAL HIGH (ref 70–99)

## 2020-11-17 NOTE — TOC Transition Note (Addendum)
Transition of Care [facility]) - CM/SW Discharge Note   Patient Details  Name: [name] MRN: [id number] Date of Birth: 1965-12-25  Transition of Care [facility] CM/SW Contact:  Carles Collet, RN Phone Number: 11/17/2020, 11:29 AM   Clinical Narrative:    [name] w patient at bedside. Discussed DC plan.  Patient confirms active cocaine use, positive on admission. Unable to secure [facility] services. Patient agreed to referral for PT to [facility], referral made. Patient states that he can arrange transportation there through [facility]. Patient understands that referral has been made, and he will need to call Monday to secure appointment. Info added to AVS. RW to be delivered to room prior to DC. Taxi voucher provided, given to charge nurse. Patient stating that he does not have the $3-4 to cover Medicaid copays, MD is aware that CM offered to provide petty cash to cover copay.   MD states that IV Abx will not be complete until tomorrow.   PLEASE SEND DC SCRIPTS THROUGH TOC PHARMACY AT DC.     Final next level of care: Home/Self Care Barriers to Discharge: No Barriers Identified   Patient Goals and CMS Choice Patient states their goals for this hospitalization and ongoing recovery are:: to go home      Discharge Placement                       Discharge Plan and Services   Discharge Planning Services: CM Consult            DME Arranged: Gilford Rile rolling DME Agency:  Celesta Aver) Date DME Agency Contacted: 11/17/20 Time DME Agency Contacted: [phone] Representative spoke with at DME Agency: [place] ([place]) Interventions     Readmission Risk Interventions Readmission Risk Prevention Plan 08/21/2019  Transportation Screening Complete  Medication Review Press photographer) Complete  HRI or [place] Complete  SW Recovery Care/Counseling Consult Complete  [place] Not Applicable  Some recent data might be hidden

## 2020-11-17 NOTE — Progress Notes (Signed)
PROGRESS NOTE    [name]  ZOX:[id number] DOB: 1966-01-28 DOA: 11/02/2020 PCP: [name], MD   Chief Complain: Back pain  Brief Narrative:  Patient is a 55 year old male with recurrent discitis , admitted on 2021 for lumbar discitis with phlegmon requiring prolonged antibiotic course admitted this time for lower back pain for 2 days.  MRI showed epidural abscess, right-sided psoas myositis.  Neurosurgery, ID consulted.  Underwent L3-L4 laminectomies with evacuation of epidural abscess.  No further surgery planned by neurosurgery.  Currently on cefepime as per ID.  ID recommended  total of 2 weeks( from 8/7 through 8/20) of cefepime followed by ciprofloxacin for 6 weeks.  He needs to be in hospital for IV antibiotics because of history of drug abuse.He will finish abx course on 8/20.  11/15/2020: Patient seen.  Also discussed with patient's girlfriend.  The plan is to complete 2 weeks of IV antibiotics and then transition to oral antibiotics.  Patient WAS CONCERNED ABOUT PATIENT'S ABILITY TO AMBULATE ON DISCHARGE.  WE WILL CONSULT PHYSICAL THERAPY CONSIDERATION.  PATIENT WAS PERLOV SECONDARY TO BE OPTIMIZED.  11/16/2020:  Patient seen.  No new changes.  Sodium is 127 today.  Input from the physical therapy team is appreciated.  Patient will complete 2 weeks of IV antibiotics tomorrow.  Likely discharge tomorrow.  11/17/2020: Patient seen.  No new changes.  Patient will complete antibiotics tomorrow.  Patient be discharged on oral antibiotics tomorrow.  Case management to assist with discharge plans.  Assessment & Plan:   Principal Problem:   Epidural abscess Active Problems:   Benign essential HTN   L3-L4 discitis with osteomyelitis: Recurrent issue.  Presented with back pain.  MRI showed epidural abscess, right-sided psoas myositis.  Neurosurgery, ID consulted.  Underwent L3-L4 laminectomies with evacuation of epidural abscess.  No further surgery planned by neurosurgery.   Currently on cefepime as per ID.  Continue pain management, minimize IV narcotic use. Blood  Cultures remain negative so far.  Epidural abscess cultures showed Pseudomonas, sensitive to Cipro.   Plan for total of 2 weeks( from 8/8) of cefepime followed by ciprofloxacin for 6 weeks,last day is 8/21 for iv abx.  He needs to be in hospital for IV antibiotics until that period because of history of drug abuse. 11/17/2020: Complete 2 weeks of IV antibiotics tomorrow and then transition to oral antibiotics.  Hyponatremia/hypokalemia:  Sodium level improved  with sodium tablets.  Potassium supplemented.Intermittently monitored 11/15/2020: Prior work-up is more consistent with SIADH.  We will repeat urine sodium and serum osmolality in the morning. -Patient is also on as needed NSAIDs and opiate.  Hypertension: BP stable on metoprolol and amlodipine  Prediabetes: Blood sugars well controlled.  Hemoglobin A1c 5.9.  Continue sliding scale insulin.  History of schizophrenia: On Cogentin, Wellbutrin, gabapentin at home  as per med rec,these medications are on hold because of  confusion.As per the spouse he does not take gabapentin or Cogentin because it makes him confused.  Altered mental status: Likely from excessive narcotics.  Now resolved.  Alert and oriented today  History of COPD: Continue as needed bronchodilators.  Currently on room air.  Sinus tachycardia:  Restarted home metoprolol . H.O chronic inappropriate sinus tachycardia. TSH normal  History of alcohol use/cocaine use: UDS positive for cocaine, opiates.  He was treated with Librium taper.  DVT prophylaxis:SCD Code Status: Full Family Communication: Called and discussed with the significant other on phone on 11/08/10 Status is: Inpatient  Remains inpatient appropriate because:Inpatient level of  care appropriate due to severity of illness  Dispo: The patient is from: Home              Anticipated d/c is to: Home              Patient  currently is not medically stable to d/c.   Difficult to place patient No Needs to continue IV antibiotic till 8/20    Consultants: ID,neurosurgery  Procedures:As above  Antimicrobials:  Anti-infectives (From admission, onward)    Start     Dose/Rate Route Frequency Ordered Stop   11/04/20 1800  vancomycin (VANCOREADY) IVPB 750 mg/150 mL  Status:  Discontinued        750 mg 150 mL/hr over 60 Minutes Intravenous Every 8 hours 11/04/20 0911 11/06/20 0836   11/04/20 1000  vancomycin (VANCOREADY) IVPB 2000 mg/400 mL        2,000 mg 200 mL/hr over 120 Minutes Intravenous  Once 11/04/20 0905 11/04/20 1237   11/04/20 1000  ceFEPIme (MAXIPIME) 2 g in sodium chloride 0.9 % 100 mL IVPB        2 g 200 mL/hr over 30 Minutes Intravenous Every 8 hours 11/04/20 0905     11/03/20 1457  ceFAZolin (ANCEF) 2-4 GM/100ML-% IVPB       Note to Pharmacy: Samuella Cota   : cabinet override      11/03/20 1457 11/04/20 0259       Subjective:  Patient seen. Patient has no new complaints.    Objective: Vitals:   11/17/20 0335 11/17/20 0834 11/17/20 1119 11/17/20 1558  BP: 115/84 116/81 (!) 123/92 108/74  Pulse: (!) 102 (!) 105  (!) 106  Resp: 18 18 19 18   Temp: 99 F (37.2 C) 99.1 F (37.3 C) 98.9 F (37.2 C) 99.6 F (37.6 C)  TempSrc: Oral Oral Oral Oral  SpO2: 99% 100% 100% 100%  Weight:      Height:        Intake/Output Summary (Last 24 hours) at 11/17/2020 1602 Last data filed at 11/17/2020 0981 Gross per 24 hour  Intake 460 ml  Output 3950 ml  Net -3490 ml    Filed Weights   11/04/20 0356 11/05/20 0330 11/06/20 0336  Weight: 78.4 kg 78.2 kg 79.1 kg    Examination:  General exam: Overall comfortable, not in distress HEENT: PERRL Respiratory system:  no wheezes or crackles  Cardiovascular system: S1 & S2 heard, RRR. Sinus tach Gastrointestinal system: Abdomen is nondistended, soft and nontender. Central nervous system: Alert and oriented Extremities: No edema, no  clubbing ,no cyanosis Skin: No rashes, no ulcers,no icterus  , clean laminectomy surgical scar on the lower back    Data Reviewed: I have personally reviewed following labs and imaging studies  CBC: Recent Labs  Lab 11/11/20 0238  WBC 11.3*  NEUTROABS 8.4*  HGB 11.0*  HCT 32.7*  MCV 83.8  PLT 520*    Basic Metabolic Panel: Recent Labs  Lab 11/11/20 0238 11/13/20 0049 11/15/20 0419 11/16/20 0147  NA 130* 130* 131* 127*  K 3.8 3.8 3.8 3.8  CL 100 99 101 96*  CO2 22 22 21* 22  GLUCOSE 110* 113* 127* 102*  BUN 13 16 13 13   CREATININE 0.72 0.79 0.71 0.78  CALCIUM 8.7* 8.7* 8.7* 8.6*  PHOS  --   --   --  4.1    GFR: Estimated Creatinine Clearance: 104.3 mL/min (by C-G formula based on SCr of 0.78 mg/dL). Liver Function Tests: Recent Labs  Lab 11/16/20  0147  ALBUMIN 2.5*     No results for input(s): LIPASE, AMYLASE in the last 168 hours. No results for input(s): AMMONIA in the last 168 hours.  Coagulation Profile: No results for input(s): INR, PROTIME in the last 168 hours. Cardiac Enzymes: No results for input(s): CKTOTAL, CKMB, CKMBINDEX, TROPONINI in the last 168 hours. BNP (last 3 results) No results for input(s): PROBNP in the last 8760 hours. HbA1C: No results for input(s): HGBA1C in the last 72 hours. CBG: Recent Labs  Lab 11/16/20 1640 11/16/20 2111 11/17/20 0613 11/17/20 1113 11/17/20 1559  GLUCAP 89 136* 104* 100* 111*    Lipid Profile: No results for input(s): CHOL, HDL, LDLCALC, TRIG, CHOLHDL, LDLDIRECT in the last 72 hours. Thyroid Function Tests: No results for input(s): TSH, T4TOTAL, FREET4, T3FREE, THYROIDAB in the last 72 hours.  Anemia Panel: No results for input(s): VITAMINB12, FOLATE, FERRITIN, TIBC, IRON, RETICCTPCT in the last 72 hours. Sepsis Labs: No results for input(s): PROCALCITON, LATICACIDVEN in the last 168 hours.   No results found for this or any previous visit (from the past 240 hour(s)).        Radiology  Studies: No results found.      Scheduled Meds:  amLODipine  10 mg Oral Daily   ferrous sulfate  325 mg Oral Q breakfast   insulin aspart  0-9 Units Subcutaneous TID WC   lactulose  10 g Oral TID   lidocaine  1 patch Transdermal Q24H   melatonin  10 mg Oral QHS   metoprolol succinate  50 mg Oral Daily   nicotine  14 mg Transdermal Daily   pantoprazole  40 mg Oral Daily   sucralfate  1 g Oral TID WC & HS   Continuous Infusions:  ceFEPime (MAXIPIME) IV 2 g (11/17/20 1009)     LOS: 14 days    Time spent:25 mins,. More than 50% of that time was spent in counseling and/or coordination of care.      Barnetta Chapel, MD [facility] P8/21/2022, 4:02 PM

## 2020-11-18 ENCOUNTER — Other Ambulatory Visit (HOSPITAL_COMMUNITY): Payer: Self-pay

## 2020-11-18 DIAGNOSIS — M4626 Osteomyelitis of vertebra, lumbar region: Secondary | ICD-10-CM

## 2020-11-18 LAB — GLUCOSE, CAPILLARY
Glucose-Capillary: 120 mg/dL — ABNORMAL HIGH (ref 70–99)
Glucose-Capillary: 116 mg/dL — ABNORMAL HIGH (ref 70–99)

## 2020-11-18 LAB — SEDIMENTATION RATE: Sed Rate: 60 mm/hr — ABNORMAL HIGH (ref 0–16)

## 2020-11-18 LAB — C-REACTIVE PROTEIN: CRP: 2.9 mg/dL — ABNORMAL HIGH (ref <1.0)

## 2020-11-18 MED ORDER — LEVOFLOXACIN 750 MG PO TABS
750.0000 mg | ORAL_TABLET | Freq: Every day | ORAL | 0 refills | Status: DC
  Filled 2020-11-18: qty 28, 28d supply, fill #0

## 2020-11-18 MED ORDER — LEVOFLOXACIN 750 MG PO TABS
750.0000 mg | ORAL_TABLET | Freq: Every day | ORAL | Status: DC
  Administered 2020-11-18: 750 mg via ORAL
  Filled 2020-11-18: qty 1

## 2020-11-18 MED ORDER — OXYCODONE HCL 10 MG PO TABS: 10.0000 mg | ORAL_TABLET | Freq: Four times a day (QID) | ORAL | 0 refills | Status: DC | PRN

## 2020-11-18 MED ORDER — METHOCARBAMOL 750 MG PO TABS
750.0000 mg | ORAL_TABLET | Freq: Three times a day (TID) | ORAL | 1 refills | Status: DC | PRN
Start: 2020-11-18 — End: 2021-10-17

## 2020-11-18 MED ORDER — SENNOSIDES-DOCUSATE SODIUM 8.6-50 MG PO TABS: 1.0000 | ORAL_TABLET | Freq: Every evening | ORAL | Status: DC | PRN

## 2020-11-18 NOTE — Discharge Summary (Signed)
Physician Discharge Summary  [name] [id number] DOB: 16-Nov-1965 DOA: 11/02/2020  PCP: Brian Mylar, MD  Admit date: 11/02/2020 Discharge date: 11/18/2020  Admitted From: Home  Disposition:  Home with [facility]   Recommendations for Outpatient Follow-up:  Follow up with PCP as soon as able  Follow up with Infectious Disease in 3 weeks Dr. Leary Roca: Please obtain CBC and BMP in 1 week as well as weekly ESR and CRP --> if ESR and CRP do not slowly trend down, please fax to Dr. Elinor [name]      Home Health: PT/OT due to leg weakness, pain with ambulation  Equipment/Devices: None new  Discharge Condition: Good  CODE STATUS: FULL Diet recommendation: Regular  Brief/Interim Summary: [name] is a 55 y.o. M with history lumbar laminectomy and fusion in 2021, then with discitis and then recurrent discitis, and also hx alcoholism in remission, schizophrenia who presented with back pain and right leg pain.  Imaging showed recurrent discitis, abscess and right psoas abscess.       PRINCIPAL HOSPITAL DIAGNOSIS: Discitis and RIGHT psoas abscess with Pseudomonas    Discharge Diagnoses:   Discitis and RIGHT psoas abscess with Pseudomonas Patient was admitted and taken to the OR for laminectomy and evacuation of abscess by Dr. Maurice Small on 8/7.   Intraop cultures grew pseudomonas.   He was treated with 2 weeks cefepime and his symptoms improved.  ID were consulted and recommended to continue 4 more weeks of fluoroquinolone therapy and follow up in their office in 3-4 weeks.  Follow up scheduled.  History IVDU Patient reports this is remote.  He has access to drug treatment programs.  He reports that he does not use drugs intravenously and will not.  He was told in no uncertain terms that use of IV drugs would almost certainly result in reinfection of his hardward, treatment failure and worse infection, and that failure to complete his Levaquin or failure to follow up with  Infectious disease would also likely risk treatment failure.  History schizophrenia  Hypertension  Hyponatremia  Hypokalemia Treated and resolved          Discharge Instructions  Discharge Instructions     Ambulatory referral to Physical Therapy   Complete by: As directed    Discharge instructions   Complete by: As directed    From Dr. Maryfrances [name]: You were admitted for an infection in your spine. You MUST take Levaquin/levofloxacin (your antibiotic) once daily  Take this for 4 more weeks (28 dats total) Keep your appointment with Dr. Elinor [name] in 1 month (this is on Sept 19th) at the [facility] for [facility]ious Disease Go see your PCP in 1 week  Do not take your oxycodone with other sedating medicines or with alcohol.   Discharge wound care:   Complete by: As directed    If you have a wound on your ankle, cover it with simple bandage, keep clean and dry.   Increase activity slowly   Complete by: As directed       Allergies as of 11/18/2020       Reactions   Chlorhexidine Itching, Rash        Medication List     STOP taking these medications    ferrous sulfate 325 (65 FE) MG tablet   omeprazole 40 MG capsule Commonly known as: PRILOSEC       TAKE these medications    albuterol 108 (90 Base) MCG/ACT inhaler Commonly known as: VENTOLIN HFA Inhale 1 puff into the  lungs 2 (two) times daily as needed for wheezing or shortness of breath. What changed: how much to take   benztropine 1 MG tablet Commonly known as: COGENTIN Take 1 mg by mouth daily.   buPROPion 300 MG 24 hr tablet Commonly known as: WELLBUTRIN XL Take 300 mg by mouth daily.   cetirizine 10 MG tablet Commonly known as: ZYRTEC TAKE 1 TABLET BY MOUTH ONCE DAILY. What changed: additional instructions   fluticasone 50 MCG/ACT nasal spray Commonly known as: FLONASE Place 1-2 sprays into both nostrils daily.   gabapentin 300 MG capsule Commonly known as: NEURONTIN Take 1 capsule  (300 mg total) by mouth 2 (two) times daily. What changed:  when to take this reasons to take this   ibuprofen 200 MG tablet Commonly known as: ADVIL Take 600 mg by mouth every 6 (six) hours as needed for mild pain or headache.   Invega Trinza 819 MG/2.63ML Susy injection Generic drug: paliperidone Palmitate ER Inject 819 mg into the muscle every 3 (three) months.   levofloxacin 750 MG tablet Commonly known as: LEVAQUIN Take 1 tablet (750 mg total) by mouth daily for 28 days.   melatonin 5 MG Tabs Take 10 mg by mouth at bedtime.   methocarbamol 750 MG tablet Commonly known as: ROBAXIN Take 1 tablet (750 mg total) by mouth every 8 (eight) hours as needed for muscle spasms.   metoprolol succinate 50 MG 24 hr tablet Commonly known as: TOPROL-XL Take 50 mg by mouth daily. Take with or immediately following a meal.   mirtazapine 15 MG tablet Commonly known as: REMERON Take 15 mg by mouth at bedtime.   ondansetron 4 MG tablet Commonly known as: ZOFRAN Take 4 mg by mouth every 8 (eight) hours as needed for nausea or vomiting.   Oxycodone HCl 10 MG Tabs Take 1 tablet (10 mg total) by mouth every 6 (six) hours as needed for moderate pain or severe pain.   pantoprazole 40 MG tablet Commonly known as: PROTONIX Take 1 tablet (40 mg total) by mouth daily.   senna-docusate 8.6-50 MG tablet Commonly known as: Senokot-S Take 1 tablet by mouth at bedtime as needed for moderate constipation.   thiamine 100 MG tablet Take 1 tablet (100 mg total) by mouth daily.   Vitrum Senior Tabs Take 1 tablet by mouth daily.               Durable Medical Equipment  (From admission, onward)           Start     Ordered   11/17/20 1131  For home use only DME Walker rolling  Once       Question Answer Comment  Walker: With 5 Inch Wheels   Patient needs a walker to treat with the following condition Weakness      11/17/20 1130              Discharge Care Instructions   (From admission, onward)           Start     Ordered   11/18/20 0000  Discharge wound care:       Comments: If you have a wound on your ankle, cover it with simple bandage, keep clean and dry.   11/18/20 1528            Follow-up Information     [facility] Follow [facility].   Specialty: Rehabilitation Why: CALL MONDAY to schedule an appointment. A referral has been placed electronically. Contact  information: [address] [id number] mc [address] [place] [address] [phone]        Brian Mylar, MD. Schedule an appointment as soon as possible for a visit in 1 week(s).   Specialty: Family Medicine Contact information: [address] [place] [address] [phone]         Odette Fraction, MD. Go to.   Specialty: Infectious Diseases Why: Appointment on 9/19, see details below Contact information: [address] [employer] [address] [place] [address] [phone]                Allergies  Allergen Reactions   Chlorhexidine Itching and Rash    Consultations: Neurosurgery ID   Procedures/Studies: DG Lumbar Spine Complete  Result Date: 11/02/2020 CLINICAL DATA:  Low back pain EXAM: LUMBAR SPINE - COMPLETE 4+ VIEW COMPARISON:  MR lumbar spine, 02/14/2020 FINDINGS: No fracture or dislocation of the lumbar spine. Focally severe disc space height loss and ankylosis of L1-L2 status post discitis osteomyelitis. Otherwise mild multilevel disc space height loss and osteophytosis, status post posterior lumbar discectomy and fusion of L4-L5. Moderate multilevel facet degenerative change. Nonobstructive pattern of overlying bowel gas. IMPRESSION: 1.  No fracture or dislocation of the lumbar spine. 2. Focally severe disc space height loss and ankylosis of L1-L2 status post discitis osteomyelitis, as seen on prior MR. 3. Otherwise mild multilevel disc space height loss and osteophytosis, status post posterior lumbar  discectomy and fusion of L4-L5. 4. Lumbar disc and neural foraminal pathology may be further evaluated by MRI if indicated by neurologically localizing signs and symptoms. Electronically Signed   By: Lauralyn Primes M.D.   On: 11/02/2020 16:52   MR Lumbar Spine W Wo Contrast  Result Date: 11/02/2020 CLINICAL DATA:  Low back pain with possible infection EXAM: MRI LUMBAR SPINE WITHOUT AND WITH CONTRAST TECHNIQUE: Multiplanar and multiecho pulse sequences of the lumbar spine were obtained without and with intravenous contrast. CONTRAST:  7.84mL GADAVIST GADOBUTROL 1 MMOL/ML IV SOLN COMPARISON:  02/14/2020 FINDINGS: Segmentation:  Standard. Alignment: Unchanged grade 1 retrolisthesis at L2-3 grade 1 anterolisthesis at L4-5. Vertebrae: There is hyperintense T2-weighted signal and abnormal contrast enhancement within the L3 and L4 vertebral bodies. There is edema in the disc space. L4-5 PLIF. Conus medullaris and cauda equina: Conus extends to the L1 level. There is diffuse abnormal contrast enhancement within the epidural space extending the length of the lumbar spine but worst at the L3-L5 levels. Paraspinal and other soft tissues: There is edema and abnormal contrast enhancement in the medial right psoas muscle.No intramuscular abscess. Prevertebral phlegmonous change. Disc levels: L1-2: Severe disc space narrowing, unchanged. L2-3: Unchanged small disc bulge. Moderate spinal canal stenosis. Circumferential epidural enhancement effaces the thecal sac. L3-4: Unchanged small disc bulge. Circumferential epidural enhancement with severe narrowing of the thecal sac. L4-5: Intermediate sized disc bulge. PLIF. Circumferential epidural contrast enhancement severely narrows the thecal sac with crowding of the nerve roots. Findings at this level have progressed. L5-S1: Normal disc space. No spinal canal stenosis. No epidural enhancement at this level. IMPRESSION: 1. New discitis/osteomyelitis at L3-L4 with epidural  abscess/phlegmon extending the length of the lumbar spine but worst at L3-L5. This results in severe narrowing of the thecal sac at both levels with crowding of the nerve roots. 2. Abnormal contrast enhancement in the medial right psoas muscle consistent with myositis. No intramuscular abscess. 3. Prevertebral phlegmonous change. Electronically Signed   By: Deatra Robinson M.D.   On: 11/02/2020 22:04   DG Knee Complete  4 Views Right  Result Date: 11/02/2020 CLINICAL DATA:  Right knee pain EXAM: RIGHT KNEE - COMPLETE 4+ VIEW COMPARISON:  None. FINDINGS: Right total knee arthroplasty has been performed. Normal alignment. No acute fracture or dislocation. There is extensive heterotopic ossification adjacent to the inferior pole of the patella which may result in some degree of impingement on full extension of the knee, however, this is not well assessed on this examination. There is mild infiltration of Hoffa's fat, nonspecific in the setting of prior arthroplasty. There is thickening and mild infiltration surrounding the distal patellar tendon which may reflect trauma or inflammation involving the patellar tendon. No effusion. Mild prepatellar soft tissue swelling. IMPRESSION: Mild prepatellar soft tissue swelling with more focal thickening and peritendinous infiltration surrounding the distal patellar tendon which may reflect inflammation or trauma involving this structure. This may be better assessed with MRI examination. Extensive heterotopic ossification surrounding the inferior pole of the patella which may result in impingement of full extension of the right knee. Correlation with clinical examination is recommended. No acute fracture or dislocation. Electronically Signed   By: Helyn Numbers MD   On: 11/02/2020 16:57   DG Hip Unilat With Pelvis 2-3 Views Right  Result Date: 11/02/2020 CLINICAL DATA:  Low back pain radiating down right leg EXAM: DG HIP (WITH OR WITHOUT PELVIS) 2-3V RIGHT COMPARISON:  None.  FINDINGS: There is no evidence of hip fracture or dislocation. There is no evidence of arthropathy or other focal bone abnormality. IMPRESSION: No displaced fracture or dislocation of the right hip. Joint spaces are preserved. Electronically Signed   By: Lauralyn Primes M.D.   On: 11/02/2020 16:49      Subjective: Feeling well.  Pain well controled.  No confusion.  Mild improving right leg pain.  No fever.   Discharge Exam: Vitals:   11/18/20 0720 11/18/20 1124  BP: 112/76 116/71  Pulse: 96 95  Resp: 16 18  Temp: 97.8 F (36.6 C) 98.4 F (36.9 C)  SpO2: 100% 100%   Vitals:   11/17/20 1940 11/18/20 0049 11/18/20 0720 11/18/20 1124  BP: 113/78 114/84 112/76 116/71  Pulse: (!) 106 (!) 102 96 95  Resp: 17 18 16 18   Temp: 99.4 F (37.4 C) 98.7 F (37.1 C) 97.8 F (36.6 C) 98.4 F (36.9 C)  TempSrc: Oral Oral Oral Oral  SpO2: 99% 99% 100% 100%  Weight:      Height:        General: Pt is alert, awake, not in acute distress Cardiovascular: RRR, nl S1-S2, no murmurs appreciated.   No LE edema.   Respiratory: Normal respiratory rate and rhythm.  CTAB without rales or wheezes. Abdominal: Abdomen soft and non-tender.  No distension or HSM.   Neuro/Psych: Strength symmetric in upper and lower extremities.  Judgment and insight appear normal.   The results of significant diagnostics from this hospitalization (including imaging, microbiology, ancillary and laboratory) are listed below for reference.     Microbiology: No results found for this or any previous visit (from the past 240 hour(s)).   Labs: BNP (last 3 results) Recent Labs    11/07/20 0119  BNP 270.4*   Basic Metabolic Panel: Recent Labs  Lab 11/13/20 0049 11/15/20 0419 11/16/20 0147  NA 130* 131* 127*  K 3.8 3.8 3.8  CL 99 101 96*  CO2 22 21* 22  GLUCOSE 113* 127* 102*  BUN 16 13 13   CREATININE 0.79 0.71 0.78  CALCIUM 8.7* 8.7* 8.6*  PHOS  --   --  4.1   Liver Function Tests: Recent Labs  Lab  11/16/20 0147  ALBUMIN 2.5*   No results for input(s): LIPASE, AMYLASE in the last 168 hours. No results for input(s): AMMONIA in the last 168 hours. CBC: No results for input(s): WBC, NEUTROABS, HGB, HCT, MCV, PLT in the last 168 hours. Cardiac Enzymes: No results for input(s): CKTOTAL, CKMB, CKMBINDEX, TROPONINI in the last 168 hours. BNP: Invalid input(s): POCBNP CBG: Recent Labs  Lab 11/17/20 1113 11/17/20 1559 11/17/20 2120 11/18/20 0616 11/18/20 1125  GLUCAP 100* 111* 156* 120* 116*   D-Dimer No results for input(s): DDIMER in the last 72 hours. Hgb A1c No results for input(s): HGBA1C in the last 72 hours. Lipid Profile No results for input(s): CHOL, HDL, LDLCALC, TRIG, CHOLHDL, LDLDIRECT in the last 72 hours. Thyroid function studies No results for input(s): TSH, T4TOTAL, T3FREE, THYROIDAB in the last 72 hours.  Invalid input(s): FREET3 Anemia work up No results for input(s): VITAMINB12, FOLATE, FERRITIN, TIBC, IRON, RETICCTPCT in the last 72 hours. Urinalysis    Component Value Date/Time   COLORURINE STRAW (A) 11/02/2020 1613   APPEARANCEUR CLEAR 11/02/2020 1613   LABSPEC 1.003 (L) 11/02/2020 1613   PHURINE 8.0 11/02/2020 1613   GLUCOSEU NEGATIVE 11/02/2020 1613   HGBUR NEGATIVE 11/02/2020 1613   BILIRUBINUR NEGATIVE 11/02/2020 1613   BILIRUBINUR negative 04/10/2016 1702   KETONESUR NEGATIVE 11/02/2020 1613   PROTEINUR NEGATIVE 11/02/2020 1613   UROBILINOGEN 0.2 04/10/2016 1702   UROBILINOGEN 0.2 12/04/2013 1044   NITRITE NEGATIVE 11/02/2020 1613   LEUKOCYTESUR NEGATIVE 11/02/2020 1613   Sepsis Labs Invalid input(s): PROCALCITONIN,  WBC,  LACTICIDVEN Microbiology No results found for this or any previous visit (from the past 240 hour(s)).   Time coordinating discharge: The [place] controlled substances registry was reviewed for this patient prior to filling the <5 days supply controlled substances script.    30 Day Unplanned Readmission Risk  Score    Flowsheet Row ED to [name]-Admission (Discharged) from 11/02/2020 in [facility] [place] [facility]  30 Day Unplanned Readmission Risk Score (%) 26.8 Filed at 11/18/2020 1600       This score is the patient's risk of an unplanned readmission within 30 days of being discharged (0 -100%). The score is based on dignosis, age, lab data, medications, orders, and past utilization.   Low:  0-14.9   Medium: 15-21.9   High: 22-29.9   Extreme: 30 and above            SIGNED:   Alberteen Sam, MD  [facility] 11/18/2020, 9:20 PM

## 2020-11-18 NOTE — TOC Transition Note (Signed)
Transition of Care [facility]) - CM/SW Discharge Note   Patient Details  Name: [name] MRN: [id number] Date of Birth: [birth year]  [facility]) CM/SW Contact:  Pollie Friar, RN Phone Number: 11/18/2020, 3:32 PM   Clinical Narrative:    Patient is discharging home with outpatient therapy. Pt has medical transport that will take him to his appts.  Antibiotic sent to [place] and has been delivered to the room. Pain meds sent to pts regular pharmacy. Walker for home is at the bedside.  Pt has cab voucher for transport home.   Final next level of care: OP Rehab Barriers to Discharge: No Barriers Identified   Patient Goals and CMS Choice Patient states their goals for this hospitalization and ongoing recovery are:: to go home   Choice offered to / list presented to : Patient  Discharge Placement                       Discharge Plan and Services   Discharge Planning Services: CM Consult            DME Arranged: Gilford Rile rolling DME Agency:  [facility] Aver) Date DME Agency Contacted: 11/17/20 Time DME Agency Contacted: 1129 Representative spoke with at DME Agency: [place] ([place]) Interventions     Readmission Risk Interventions Readmission Risk Prevention Plan 08/21/2019  Transportation Screening Complete  Medication Review Press photographer) Complete  HRI or [place] Complete  SW Recovery Care/Counseling Consult Complete  Antoine Not Applicable  Some recent data might be hidden

## 2020-11-18 NOTE — Progress Notes (Signed)
Patient D/C home with medication provided by [facility]

## 2020-11-18 NOTE — Plan of Care (Signed)
  Problem: Education: Goal: Knowledge of General Education information will improve Description: Including pain rating scale, medication(s)/side effects and non-pharmacologic comfort measures Outcome: Progressing   Problem: Health Behavior/Discharge Planning: Goal: Ability to manage health-related needs will improve Outcome: Progressing   Problem: Clinical Measurements: Goal: Ability to maintain clinical measurements within normal limits will improve Outcome: Progressing Goal: Will remain free from infection Outcome: Progressing Goal: Diagnostic test results will improve Outcome: Progressing Goal: Respiratory complications will improve Outcome: Progressing Goal: Cardiovascular complication will be avoided Outcome: Progressing   Problem: Health Behavior/Discharge Planning: Goal: Identification of resources available to assist in meeting health care needs will improve Outcome: Progressing   Problem: Skin Integrity: Goal: Will show signs of wound healing Outcome: Progressing   Problem: Pain Management: Goal: Pain level will decrease Outcome: Progressing

## 2020-11-18 NOTE — Progress Notes (Signed)
[facility] for Infectious Disease  Date of Admission:  11/02/2020     Total days of antibiotics 15         ASSESSMENT:  Mr. Schlotterbeck has completed 2 weeks of Cefepime for recurrent Pseudomonas discitis now complicated by epidural abscess and right sided psoas myositis s/p evacuation and laminectomy on 11/03/20. Not an ideal candidate for home therapy with IV antibiotics and will change or oral at this point for the remainder of treatment with levofloxacin 750 mg daily and arrange follow up with Dr. West Bali in the clinic. Will arrange for TOC to Continue with pain management per primary team.   PLAN:  Change cefepime to levofloxacin 750 mg. Check inflammatory markers. Continue pain management per primary team. Follow up with Dr. West Bali 12/16/20 at 10:45am.   ID will sign off. Please re-consult if needed.    Principal Problem:   Epidural abscess Active Problems:   Benign essential HTN    amLODipine  10 mg Oral Daily   ferrous sulfate  325 mg Oral Q breakfast   insulin aspart  0-9 Units Subcutaneous TID WC   lactulose  10 g Oral TID   lidocaine  1 patch Transdermal Q24H   melatonin  10 mg Oral QHS   metoprolol succinate  50 mg Oral Daily   nicotine  14 mg Transdermal Daily   pantoprazole  40 mg Oral Daily   sucralfate  1 g Oral TID WC & HS    SUBJECTIVE:  Mr. Sermersheim was last seen on 11/08/20 by Dr. West Bali with plan for 2 weeks of cefepime then transition to oral flouroquinolone for the remainder of 6 weeks. Mr. Weniger has remained afebrile with no new concerns/complaints since he was last seen. Has question of how he got this infection.   Allergies  Allergen Reactions   Chlorhexidine Itching and Rash     Review of Systems: Review of Systems  Constitutional:  Negative for chills, fever and weight loss.  Respiratory:  Negative for cough, shortness of breath and wheezing.   Cardiovascular:  Negative for chest pain and leg swelling.  Gastrointestinal:  Negative  for abdominal pain, constipation, diarrhea, nausea and vomiting.  Musculoskeletal:  Positive for back pain.  Skin:  Negative for rash.     OBJECTIVE: Vitals:   11/17/20 1558 11/17/20 1940 11/18/20 0049 11/18/20 0720  BP: 108/74 113/78 114/84 112/76  Pulse: (!) 106 (!) 106 (!) 102 96  Resp: '18 17 18 16  '$ Temp: 99.6 F (37.6 C) 99.4 F (37.4 C) 98.7 F (37.1 C) 97.8 F (36.6 C)  TempSrc: Oral Oral Oral Oral  SpO2: 100% 99% 99% 100%  Weight:      Height:       Body mass index is 25.75 kg/m.  Physical Exam Constitutional:      General: He is not in acute distress.    Appearance: He is well-developed.     Comments: Lying in bed with head of bed elevated; pleasant.   Cardiovascular:     Rate and Rhythm: Normal rate and regular rhythm.     Heart sounds: Normal heart sounds.  Pulmonary:     Effort: Pulmonary effort is normal.     Breath sounds: Normal breath sounds.  Skin:    General: Skin is warm and dry.  Neurological:     Mental Status: He is alert and oriented to person, place, and time.  Psychiatric:        Mood and Affect: Mood normal.  Lab Results Lab Results  Component Value Date   WBC 11.3 (H) 11/11/2020   HGB 11.0 (L) 11/11/2020   HCT 32.7 (L) 11/11/2020   MCV 83.8 11/11/2020   PLT 520 (H) 11/11/2020    Lab Results  Component Value Date   CREATININE 0.78 11/16/2020   BUN 13 11/16/2020   NA 127 (L) 11/16/2020   K 3.8 11/16/2020   CL 96 (L) 11/16/2020   CO2 22 11/16/2020    Lab Results  Component Value Date   ALT 12 11/02/2020   AST 34 11/02/2020   ALKPHOS 103 11/02/2020   BILITOT 0.5 11/02/2020     Microbiology: No results found for this or any previous visit (from the past 240 hour(s)).   Terri Piedra, NP [facility]  11/18/2020  11:05 AM

## 2020-11-18 NOTE — Progress Notes (Signed)
Physical Therapy Treatment Patient Details Name: [name] MRN: [id number] DOB: 1966/02/11 Today's Date: 11/18/2020    History of Present Illness 55 y/o male presented to [facility] ED on 8/6 with complaints of progressively worsening back pain x 3 days. MRI shows features concering for discitis involving L3-4 area with epidural abscess and features concerning for R sided psoas myositis. Patient s/p L3-4 laminectomies and evacuation of epidural abscess on 8/7. PMH of discitis April 2021, s/p laminectormy, schizophrenia, cocaine use, prior stroke, anemia    PT Comments    Pt progressing with mobility. With longer distance had a slight buckle of knee which he was able to correct. Instructed pt to amb frequently at home for shorter distances. Pt reports girlfriend available to assist.    Follow Up Recommendations  Home health PT;Supervision for mobility/OOB     Equipment Recommendations  Rolling walker with 5" wheels    Recommendations for Other Services       Precautions / Restrictions Precautions Precautions: Fall    Mobility  Bed Mobility               General bed mobility comments: Pt sitting EOB    Transfers Overall transfer level: Needs assistance Equipment used: Rolling walker (2 wheeled) Transfers: Sit to/from Stand Sit to Stand: Supervision         General transfer comment: supervision for safety  Ambulation/Gait Ambulation/Gait assistance: Min guard Gait Distance (Feet): 125 Feet Assistive device: Rolling walker (2 wheeled) Gait Pattern/deviations: Step-through pattern;Decreased stride length Gait velocity: decreased Gait velocity interpretation: 1.31 - 2.62 ft/sec, indicative of limited community ambulator General Gait Details: slight lt knee buckling but able to correct without assist   Stairs             Wheelchair Mobility    Modified Rankin (Stroke Patients Only)       Balance Overall balance assessment: Needs  assistance Sitting-balance support: No upper extremity supported;Feet unsupported Sitting balance-[name]: Normal     Standing balance support: No upper extremity supported;During functional activity Standing balance-[name]: Fair                              Cognition Arousal/Alertness: Awake/alert Behavior During Therapy: WFL for tasks assessed/performed Overall Cognitive Status: No family/caregiver present to determine baseline cognitive functioning                                        Exercises      General Comments General comments (skin integrity, edema, etc.): Adjusted pt's walker for home      Pertinent Vitals/Pain Pain Assessment: Faces Faces Pain Scale: Hurts little more Pain Location: back Pain Descriptors / Indicators: Guarding Pain Intervention(s): Limited activity within patient's tolerance    Home Living                      Prior Function            PT Goals (current goals can now be found in the care plan section) Progress towards PT goals: Progressing toward goals    Frequency    Min 4X/week      PT Plan Current plan remains appropriate    Co-evaluation              AM-PAC PT "6 Clicks" Mobility   Outcome Measure  Help  needed turning from your back to your side while in a flat bed without using bedrails?: None Help needed moving from lying on your back to sitting on the side of a flat bed without using bedrails?: None Help needed moving to and from a bed to a chair (including a wheelchair)?: A Little Help needed standing up from a chair using your arms (e.g., wheelchair or bedside chair)?: A Little Help needed to walk in hospital room?: A Little Help needed climbing 3-5 steps with a railing? : A Little 6 Click Score: 20    End of Session   Activity Tolerance: Patient tolerated treatment well Patient left: in bed;with call bell/phone within reach;with nursing/sitter in room Nurse  Communication: Mobility status PT Visit Diagnosis: Unsteadiness on feet (R26.81);Muscle weakness (generalized) (M62.81);Difficulty in walking, not elsewhere classified (R26.2)     Time: 4098-1191 PT Time Calculation (min) (ACUTE ONLY): 9 min  Charges:  $Gait Training: 8-22 mins                     [facility] PT Acute Rehabilitation Services Pager [phone] Office [phone]    Angelina [facility] 11/18/2020, 4:06 PM

## 2020-11-18 NOTE — Progress Notes (Signed)
I have seen and examined the patient. I have personally reviewed the clinical findings, laboratory findings, microbiological data and imaging studies. The assessment and treatment plan was discussed with the  APP, Terri Piedra.   I agree with their recommendations with the following comments:  Pt seen this morning and has completed 2 weeks of IV Cefepime in setting of recurrent Pseudomonas discitis c/b epidural abscess and right sided psoas myositis s/p evacuation and laminectomy 11/03/20.  Will transition to PO therapy as previously outlined by Dr West Bali to complete therapy and arrange follow up with her in clinic.  Please call as needed.    Raynelle [facility] 11/18/2020, 12:24 PM   I spent greater than 35 minutes with the patient including greater than 50% of time in face to face counsel of the patient and in coordination of their care.

## 2020-11-18 NOTE — Plan of Care (Signed)
  Problem: Education: Goal: Knowledge of General Education information will improve Description: Including pain rating scale, medication(s)/side effects and non-pharmacologic comfort measures 11/18/2020 1556 by Delia Chimes, RN Outcome: Adequate for Discharge 11/18/2020 1415 by Delia Chimes, RN Outcome: Progressing   Problem: Health Behavior/Discharge Planning: Goal: Ability to manage health-related needs will improve 11/18/2020 1556 by Delia Chimes, RN Outcome: Adequate for Discharge 11/18/2020 1415 by Delia Chimes, RN Outcome: Progressing   Problem: Clinical Measurements: Goal: Ability to maintain clinical measurements within normal limits will improve 11/18/2020 1556 by Delia Chimes, RN Outcome: Adequate for Discharge 11/18/2020 1415 by Delia Chimes, RN Outcome: Progressing Goal: Will remain free from infection 11/18/2020 1556 by Delia Chimes, RN Outcome: Adequate for Discharge 11/18/2020 1415 by Delia Chimes, RN Outcome: Progressing Goal: Diagnostic test results will improve 11/18/2020 1556 by Delia Chimes, RN Outcome: Adequate for Discharge 11/18/2020 1415 by Delia Chimes, RN Outcome: Progressing Goal: Respiratory complications will improve 11/18/2020 1556 by Delia Chimes, RN Outcome: Adequate for Discharge 11/18/2020 1415 by Delia Chimes, RN Outcome: Progressing Goal: Cardiovascular complication will be avoided 11/18/2020 1556 by Delia Chimes, RN Outcome: Adequate for Discharge 11/18/2020 1415 by Delia Chimes, RN Outcome: Progressing

## 2020-11-18 NOTE — Plan of Care (Signed)

## 2020-11-20 ENCOUNTER — Telehealth: Payer: Self-pay | Admitting: Pharmacist

## 2020-11-20 ENCOUNTER — Other Ambulatory Visit: Payer: Self-pay | Admitting: Pharmacist

## 2020-11-20 ENCOUNTER — Inpatient Hospital Stay: Payer: Medicare Other | Admitting: Infectious Diseases

## 2020-11-20 DIAGNOSIS — M4640 Discitis, unspecified, site unspecified: Secondary | ICD-10-CM

## 2020-11-20 MED ORDER — CIPROFLOXACIN HCL 500 MG PO TABS: 500.0000 mg | ORAL_TABLET | Freq: Two times a day (BID) | ORAL | 0 refills | Status: DC

## 2020-11-20 NOTE — Telephone Encounter (Signed)
Patient's significant other called clinic today stating Astin is experiencing hives, itching, and confusion since starting levofloxacin at home for his Pseudomonas discitis, epidural abscess, and myositis. He only received cefepime in the hospital. Counseled patient to stop taking levofloxacin today given he is likely experiencing an allergic reaction.  Inquired about prior cipro use, and Kerim states he has taken cipro before and tolerated well. Please advise on appropriate regimen. I am more than happy to send cipro script over if appropriate. Will update levofloxacin hypersensitivity. I am not aware of any other new medications he started upon discharge that might have otherwise caused this reaction.  Thank you!

## 2020-11-20 NOTE — Telephone Encounter (Signed)
Prescription has been sent, and patient aware - thanks!

## 2020-11-20 NOTE — Telephone Encounter (Signed)
No problem; will send in - thank you!

## 2020-12-16 ENCOUNTER — Other Ambulatory Visit (HOSPITAL_COMMUNITY): Payer: Self-pay

## 2020-12-16 ENCOUNTER — Encounter: Payer: Self-pay | Admitting: Infectious Diseases

## 2020-12-16 ENCOUNTER — Other Ambulatory Visit: Payer: Self-pay

## 2020-12-16 ENCOUNTER — Ambulatory Visit (INDEPENDENT_AMBULATORY_CARE_PROVIDER_SITE_OTHER): Payer: Medicare Other | Admitting: Infectious Diseases

## 2020-12-16 VITALS — BP 130/86 | HR 97 | Temp 98.1°F | Wt 168.0 lb

## 2020-12-16 DIAGNOSIS — Z5181 Encounter for therapeutic drug level monitoring: Secondary | ICD-10-CM

## 2020-12-16 DIAGNOSIS — M4640 Discitis, unspecified, site unspecified: Secondary | ICD-10-CM | POA: Diagnosis not present

## 2020-12-16 MED ORDER — CIPROFLOXACIN HCL 500 MG PO TABS: 500.0000 mg | ORAL_TABLET | Freq: Two times a day (BID) | ORAL | 0 refills | Status: DC

## 2020-12-16 MED ORDER — CIPROFLOXACIN HCL 500 MG PO TABS: 500.0000 mg | ORAL_TABLET | Freq: Two times a day (BID) | ORAL | 1 refills | Status: AC

## 2020-12-16 NOTE — Addendum Note (Signed)
Addended by: Rosiland Oz on: 12/16/2020 12:24 PM   Modules accepted: Orders

## 2020-12-16 NOTE — Progress Notes (Addendum)
[facility]                                                             [place], [place], [place], [address]                                                                  Phn. 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  Operative cultures growing Pseudomonas aeruginosa, sensitive to ciprofloxacin   # history of TLIF L4-5 with hardware followed by  L1-L2 discitis and osteomyelitis s/p  IV abtx for 6 weeks ( May-June 2021) followed by recurrent/advancing T-L discitis and osteomyelitis and epidural phlegmon s/p IV Vancomycin+ cefepime>> oritavancin  and ciprofloxacin ( October-December 2021)   # Prior h/o left ankle infection a/w hardware ( removed) # Rt TKA # Polysubstance abuse  # medication Monitoring labs 12/06/20 cr 0.73, wbc 9.2, hb 11.8, plts 506  Plan Continue Ciprofloxacin 500mg  PO BID, meds refilled End of 6 weeks  is 12/16/20. Will plan for PO suppression given recurrence and presence of hardware. BMP, ESR and CRP today  Follow up in 3-4 weeks  Follow up with Neurosurgery  All questions and concerns were discussed and addressed. Patient verbalized understanding of the plan. ____________________________________________________________________________________________________________________  HPI/interval events 12/16/2020 Patient is here for follow-up for recurrent discitis and osteomyelitis of lumbar spine.  Patient was last seen by me on 11/07/2020.  Patient received 2 weeks of IV cefepime in the hospital and  was discharged on 11/18/2020 on levofloxacin for 4 more weeks.  Levofloxacin was changed to ciprofloxacin given history of allergic reaction with levofloxacin.  He has been taking ciprofloxacin 500 mg twice a day without missing any doses.  Denies any side effects with the antibiotics.  Back pain is 7 out of 10 and has been stable.  He also feels his whole body is weak and thinks probably it is due to lying in the bed in the hospital for several days.  He feels his lower extremities are weak as well.  He denies any numbness, tingling.  He is able to walk with the help of walker.  He takes oxycodone 1 tablet every 6 hours for back pain.  He has not followed up with neurosurgery after getting discharged from the hospital.  He would like to follow-up with neurosurgery for his back pain, provided  office number of Dr. Venetia Constable for hospital follow-up.   ROS: Constitutional: Negative for fever, chills, activity change, appetite change, fatigue and unexpected weight change.  Respiratory: Negative for cough, shortness of breath Cardiovascular: Negative for chest pain, palpitations and leg swelling.  Gastrointestinal: Negative for nausea, vomiting, abdominal pain, diarrhea/constipation, .  Genitourinary: Negative for dysuria, hematuria, flank pain Musculoskeletal: Negative for myalgias,  joint swelling, Lower back pain + Skin: Negative for rashes, lesions  Neurological: Negative for weakness, dizziness or headache  Past Medical History:  Diagnosis Date   Anxiety    Arthritis    Asthma    Depression    Duodenal adenoma    GERD (gastroesophageal reflux disease)    Hypertension    Pneumonia    hx   Post-traumatic osteoarthritis of left ankle    Pre-diabetes    Presence of right artificial knee joint 12/17/2016   Schizophrenia ([place])    Stroke ([place])    2009-or10   Past Surgical History:  Procedure Laterality Date   ANKLE ARTHROSCOPY Left 10/20/2017   Procedure: ANKLE ARTHROSCOPY;  Surgeon: Newt Minion, MD;  Location: [facility];  Service: Orthopedics;  Laterality: Left;   ANKLE FUSION Left 10/20/2017   ANKLE FUSION Left 10/20/2017   Procedure: LEFT ANKLE FUSION;  Surgeon: Newt Minion, MD;  Location: [facility];  Service: Orthopedics;  Laterality: Left;   ESOPHAGOGASTRODUODENOSCOPY Left 12/05/2013   Procedure: ESOPHAGOGASTRODUODENOSCOPY (EGD);  Surgeon: Arta Silence, MD;  Location: [facility];  Service: Endoscopy;  Laterality: Left;   ESOPHAGOGASTRODUODENOSCOPY (EGD) WITH PROPOFOL N/A 02/20/2015   Procedure: ESOPHAGOGASTRODUODENOSCOPY (EGD) WITH PROPOFOL;  Surgeon: Wilford Corner, MD;  Location: [facility];  Service: Endoscopy;  Laterality: N/A;   HARDWARE REMOVAL Left 01/29/2018   Procedure: HARDWARE REMOVAL LEFT ANKLE;  Surgeon: Newt Minion, MD;  Location: [facility];  Service: Orthopedics;  Laterality: Left;   IR LUMBAR DISC ASPIRATION W/IMG GUIDE  01/08/2020   JOINT REPLACEMENT     THORACIC LAMINECTOMY FOR EPIDURAL ABSCESS N/A 11/03/2020   Procedure: LUMBAR THREE AND LUMBAR  FOUR LAMINECTOMY FOR EVACUATION EPIDURAL ABSCESS;  Surgeon: Judith Part, MD;  Location: [facility];  Service: Neurosurgery;  Laterality: N/A;   TOTAL KNEE ARTHROPLASTY Right 07/21/2016   Procedure: TOTAL KNEE ARTHROPLASTY;  Surgeon: Meredith Pel, MD;  Location: [facility];  Service: Orthopedics;  Laterality: Right;   UPPER GASTROINTESTINAL ENDOSCOPY     Current Outpatient Medications on File Prior to Visit  Medication Sig Dispense Refill   albuterol (PROVENTIL HFA;VENTOLIN HFA) 108 (90 Base) MCG/ACT inhaler Inhale 1 puff into the lungs 2 (two) times daily as needed for wheezing or shortness of breath. (Patient taking differently: Inhale 2 puffs into the lungs 2 (two) times daily as needed for wheezing or shortness of breath.) 1 Inhaler 1   benztropine (COGENTIN) 1 MG tablet Take 1 mg by mouth daily.     buPROPion (WELLBUTRIN XL) 300 MG 24 hr tablet Take 300 mg by mouth daily.     cetirizine (ZYRTEC) 10 MG tablet  TAKE 1 TABLET BY MOUTH ONCE DAILY. (Patient taking differently: Take 10 mg by mouth daily. No Therapeutic Substitution) 28 tablet 11   fluticasone (FLONASE) 50 MCG/ACT nasal spray Place 1-2 sprays into both nostrils daily.     gabapentin (NEURONTIN) 300 MG capsule Take 1 capsule (300 mg total) by mouth 2 (two) times daily. (Patient taking differently: Take 300 mg by mouth 2 (two) times daily as needed (for nerve pain).) 180 capsule 4   ibuprofen (  ADVIL) 200 MG tablet Take 600 mg by mouth every 6 (six) hours as needed for mild pain or headache.     INVEGA TRINZA 819 MG/2.63ML SUSY injection Inject 819 mg into the muscle every 3 (three) months.     Melatonin 5 MG TABS Take 10 mg by mouth at bedtime.     methocarbamol (ROBAXIN) 750 MG tablet Take 1 tablet (750 mg total) by mouth every 8 (eight) hours as needed for muscle spasms. 24 tablet 1   metoprolol succinate (TOPROL-XL) 50 MG 24 hr tablet Take 50 mg by mouth daily. Take with or immediately following a meal.     mirtazapine (REMERON) 15 MG tablet Take 15 mg by mouth at bedtime.     Multiple Vitamins-Minerals (VITRUM SENIOR) TABS Take 1 tablet by mouth daily.      ondansetron (ZOFRAN) 4 MG tablet Take 4 mg by mouth every 8 (eight) hours as needed for nausea or vomiting.     oxyCODONE 10 MG TABS Take 1 tablet (10 mg total) by mouth every 6 (six) hours as needed for moderate pain or severe pain. 20 tablet 0   senna-docusate (SENOKOT-S) 8.6-50 MG tablet Take 1 tablet by mouth at bedtime as needed for moderate constipation.     thiamine 100 MG tablet Take 1 tablet (100 mg total) by mouth daily. 30 tablet 0   pantoprazole (PROTONIX) 40 MG tablet Take 1 tablet (40 mg total) by mouth daily. 30 tablet 0   No current facility-administered medications on file prior to visit.   Allergies  Allergen Reactions   Levofloxacin Hives and Other (See Comments)    Confusion, hives, and itching (patient has tolerated ciprofloxacin)   Chlorhexidine Itching and Rash    Social History   Socioeconomic History   Marital status: Significant Other    Spouse name: Not on file   Number of children: Not on file   Years of education: Not on file   Highest education level: Not on file  Occupational History   Not on file  Tobacco Use   Smoking status: Heavy Smoker    Packs/day: 1.50    Years: 38.00    Pack years: 57.00    Types: Cigarettes   Smokeless tobacco: Never   Tobacco comments:    onset age 89; upto 1.5 ppd  Vaping Use   Vaping Use: Never used  Substance and Sexual Activity   Alcohol use: Not Currently    Alcohol/week: 3.0 standard drinks    Types: 3 Cans of beer per week    Comment: quit 2015; formerly up to 2 fifths/day   Drug use: Yes    Comment: Heroin    Sexual activity: Yes    Comment: declined condoms  Other Topics Concern   Not on file  Social History Narrative   Not on file   Social Determinants of Health   Financial Resource Strain: Not on file  Food Insecurity: Not on file  Transportation Needs: Not on file  Physical Activity: Not on file  Stress: Not on file  Social Connections: Not on file  Intimate Partner Violence: Not on file   Family History  Adopted: Yes  Problem Relation Age of Onset   Colon cancer Neg Hx    Esophageal cancer Neg Hx    Rectal cancer Neg Hx    Stomach cancer Neg Hx      Vitals BP 130/86   Pulse 97   Temp 98.1 F (36.7 C) (Oral)   Wt 168 lb (  76.2 kg)   BMI 24.81 kg/m    Examination  General - not in acute distress, comfortably sitting in chair, has a rolling walker  HEENT - PEERLA, no pallor and no icterus Chest - b/l clear air entry, no additional sounds CVS- Normal s1s2, RRR Abdomen - Soft, Non tender , non distended Ext- no pedal edema Neuro: Power Bilateral knee extension 4/5, Power bilateral dorsiflexion and plantar flexion 4/5 Back - lower back surgical site healing well  Psych : calm and cooperative, aa0-3   Recent labs CBC Latest Ref Rng & Units 11/11/2020  11/08/2020 11/07/2020  WBC 4.0 - 10.5 K/uL 11.3(H) 13.5(H) 14.5(H)  Hemoglobin 13.0 - 17.0 g/dL 11.0(L) 9.8(L) 10.8(L)  Hematocrit 39.0 - 52.0 % 32.7(L) 30.2(L) 32.2(L)  Platelets 150 - 400 K/uL 520(H) 400 349   CMP Latest Ref Rng & Units 11/16/2020 11/15/2020 11/13/2020  Glucose 70 - 99 mg/dL 102(H) 127(H) 113(H)  BUN 6 - 20 mg/dL $Remove'13 13 16  'AHDqSjt$ Creatinine 0.61 - 1.24 mg/dL 0.78 0.71 0.79  Sodium 135 - 145 mmol/L 127(L) 131(L) 130(L)  Potassium 3.5 - 5.1 mmol/L 3.8 3.8 3.8  Chloride 98 - 111 mmol/L 96(L) 101 99  CO2 22 - 32 mmol/L 22 21(L) 22  Calcium 8.9 - 10.3 mg/dL 8.6(L) 8.7(L) 8.7(L)  Total Protein 6.5 - 8.1 g/dL - - -  Total Bilirubin 0.3 - 1.2 mg/dL - - -  Alkaline Phos 38 - 126 U/L - - -  AST 15 - 41 U/L - - -  ALT 0 - 44 U/L - - -     Pertinent Microbiology Results for orders placed or performed during the hospital encounter of 11/02/20  Resp Panel by RT-PCR (Flu A&B, Covid) Urine, Unspecified Source     Status: None   Collection Time: 11/02/20  4:13 PM   Specimen: Urine, Unspecified Source; Nasopharyngeal(NP) swabs in vial transport medium  Result Value Ref Range Status   SARS Coronavirus 2 by RT PCR NEGATIVE NEGATIVE Final    Comment: (NOTE) SARS-CoV-2 target nucleic acids are NOT DETECTED.  The SARS-CoV-2 RNA is generally detectable in upper respiratory specimens during the acute phase of infection. The lowest concentration of SARS-CoV-2 viral copies this assay can detect is 138 copies/mL. A negative result does not preclude SARS-Cov-2 infection and should not be used as the sole basis for treatment or other patient management decisions. A negative result may occur with  improper specimen collection/handling, submission of specimen other than nasopharyngeal swab, presence of viral mutation(s) within the areas targeted by this assay, and inadequate number of viral copies(<138 copies/mL). A negative result must be combined with clinical observations, patient history, and  epidemiological information. The expected result is Negative.  Fact Sheet for Patients:  EntrepreneurPulse.com.au  Fact Sheet for Healthcare Providers:  IncredibleEmployment.be  This test is no t yet approved or cleared by the [place] FDA and  has been authorized for detection and/or diagnosis of SARS-CoV-2 by FDA under an Emergency Use Authorization (EUA). This EUA will remain  in effect (meaning this test can be used) for the duration of the COVID-19 declaration under Section 564(b)(1) of the Act, 21 U.S.C.section 360bbb-3(b)(1), unless the authorization is terminated  or revoked sooner.       Influenza A by PCR NEGATIVE NEGATIVE Final   Influenza B by PCR NEGATIVE NEGATIVE Final    Comment: (NOTE) The Xpert Xpress SARS-CoV-2/FLU/RSV plus assay is intended as an aid in the diagnosis of influenza from Nasopharyngeal swab specimens and should not  be used as a sole basis for treatment. Nasal washings and aspirates are unacceptable for Xpert Xpress SARS-CoV-2/FLU/RSV testing.  Fact Sheet for Patients: BloggerCourse.com  Fact Sheet for Healthcare Providers: SeriousBroker.it  This test is not yet approved or cleared by the [place] FDA and has been authorized for detection and/or diagnosis of SARS-CoV-2 by FDA under an Emergency Use Authorization (EUA). This EUA will remain in effect (meaning this test can be used) for the duration of the COVID-19 declaration under Section 564(b)(1) of the Act, 21 U.S.C. section 360bbb-3(b)(1), unless the authorization is terminated or revoked.  Performed at [facility], [address], [place], [place] [address]   Blood culture (routine x 2)     Status: None   Collection Time: 11/02/20  8:00 PM   Specimen: BLOOD  Result Value Ref Range Status   Specimen Description   Final    BLOOD BLOOD RIGHT HAND Performed at [facility], [address], [place], [place] [address]    Special Requests   Final    BOTTLES DRAWN AEROBIC AND ANAEROBIC Blood Culture adequate volume Performed at [facility], [address], [place], [place] [address]    Culture   Final    NO GROWTH 5 DAYS Performed at [facility], [address] [place], [place] [address]    Report Status 11/08/2020 FINAL  Final  Blood culture (routine x 2)     Status: None   Collection Time: 11/02/20  8:10 PM   Specimen: BLOOD  Result Value Ref Range Status   Specimen Description   Final    BLOOD BLOOD LEFT HAND Performed at [facility], [address], [place], [place] [address]    Special Requests   Final    BOTTLES DRAWN AEROBIC ONLY Blood Culture results may not be optimal due to an inadequate volume of blood received in culture bottles Performed at [facility], [address], [place], [place] [address]    Culture   Final    NO GROWTH 5 DAYS Performed at [facility], [address], [place], [place] [address]    Report Status 11/08/2020 FINAL  Final  Surgical pcr screen     Status: None   Collection Time: 11/03/20  2:14 PM   Specimen: Nasal Mucosa; Nasal Swab  Result Value Ref Range Status   MRSA, PCR NEGATIVE NEGATIVE Final   Staphylococcus aureus NEGATIVE NEGATIVE Final    Comment: (NOTE) The Xpert SA Assay (FDA approved for NASAL specimens in patients 48 years of age and older), is one component of a comprehensive surveillance program. It is not intended to diagnose infection nor to guide or monitor treatment. Performed at [facility], [address], [place], [place] [address]   Aerobic/Anaerobic Culture w Gram Stain (surgical/deep wound)     Status: None   Collection Time: 11/03/20  3:49 PM   Specimen: Abscess  Result Value Ref Range Status   Specimen Description ABSCESS  Final   Special Requests LUMBAR EPIDURAL ABSCESS SPEC A   Final   Gram Stain   Final    RARE WBC PRESENT, PREDOMINANTLY PMN NO ORGANISMS SEEN    Culture   Final    RARE PSEUDOMONAS AERUGINOSA NO ANAEROBES ISOLATED Performed at [facility], [address] [place], [place] [address]    Report Status 11/08/2020 FINAL  Final   Organism ID, Bacteria PSEUDOMONAS AERUGINOSA  Final  Susceptibility   Pseudomonas aeruginosa - MIC*    CEFTAZIDIME 4 SENSITIVE Sensitive     CIPROFLOXACIN <=0.25 SENSITIVE Sensitive     GENTAMICIN <=1 SENSITIVE Sensitive     IMIPENEM 1 SENSITIVE Sensitive     PIP/TAZO <=4 SENSITIVE Sensitive     CEFEPIME 2 SENSITIVE Sensitive     * RARE PSEUDOMONAS AERUGINOSA   Pertinent Imaging All pertinent labs/Imagings/notes reviewed. All pertinent plain films and CT images have been personally visualized and interpreted; radiology reports have been reviewed. Decision making incorporated into the Impression / Recommendations.  I have spent a total of 60 minutes of face-to-face and non-face-to-face time, excluding clinical staff time, preparing to see patient, ordering tests and/or medications, and provide counseling the patient    Electronically signed by:  Rosiland Oz, MD Infectious Disease Physician [facility] [address] [place], [place] [address] Phone: [phone]  Fax: [phone]

## 2020-12-17 LAB — BASIC METABOLIC PANEL
BUN: 11 mg/dL (ref 7–25)
CO2: 28 mmol/L (ref 20–32)
Calcium: 8.8 mg/dL (ref 8.6–10.3)
Chloride: 97 mmol/L — ABNORMAL LOW (ref 98–110)
Creat: 0.79 mg/dL (ref 0.70–1.30)
Glucose, Bld: 118 mg/dL — ABNORMAL HIGH (ref 65–99)
Potassium: 3.5 mmol/L (ref 3.5–5.3)
Sodium: 134 mmol/L — ABNORMAL LOW (ref 135–146)

## 2020-12-17 LAB — C-REACTIVE PROTEIN: CRP: 24.7 mg/L — ABNORMAL HIGH (ref <8.0)

## 2020-12-17 LAB — SEDIMENTATION RATE: Sed Rate: 36 mm/h — ABNORMAL HIGH (ref 0–20)

## 2021-01-08 ENCOUNTER — Other Ambulatory Visit: Payer: Self-pay | Admitting: Specialist

## 2021-01-09 ENCOUNTER — Other Ambulatory Visit: Payer: Self-pay

## 2021-01-09 ENCOUNTER — Ambulatory Visit (INDEPENDENT_AMBULATORY_CARE_PROVIDER_SITE_OTHER): Payer: Medicare Other | Admitting: Surgery

## 2021-01-09 ENCOUNTER — Encounter: Payer: Self-pay | Admitting: Surgery

## 2021-01-09 VITALS — BP 152/98 | HR 91 | Ht 69.0 in | Wt 168.0 lb

## 2021-01-09 DIAGNOSIS — Z981 Arthrodesis status: Secondary | ICD-10-CM

## 2021-01-17 NOTE — Progress Notes (Signed)
Patient was not seen today.  Came in with complaints of back pain and wanted pain medication.  Patient just had L3-4 laminectomy for evacuation of epidural abscess by Dr. Emelda Brothers neurosurgeon November 03, 2020.  My assistant advised patient that he needed to follow-up with his neurosurgeon for any current lumbar spine issues.

## 2021-01-21 ENCOUNTER — Ambulatory Visit: Payer: Medicare Other | Admitting: Infectious Diseases

## 2021-07-03 ENCOUNTER — Other Ambulatory Visit: Payer: Self-pay | Admitting: Specialist

## 2021-08-07 ENCOUNTER — Ambulatory Visit: Payer: Medicare Other | Admitting: Surgery

## 2021-08-14 ENCOUNTER — Ambulatory Visit: Payer: Medicare Other | Admitting: Surgery

## 2021-10-16 ENCOUNTER — Other Ambulatory Visit: Payer: Self-pay

## 2021-10-16 ENCOUNTER — Emergency Department (HOSPITAL_COMMUNITY)
Admission: EM | Admit: 2021-10-16 | Discharge: 2021-10-17 | Disposition: A | Discharge status: Home / Self Care | Payer: Medicare Other | Attending: Emergency Medicine | Admitting: Emergency Medicine

## 2021-10-16 DIAGNOSIS — R109 Unspecified abdominal pain: Secondary | ICD-10-CM

## 2021-10-16 LAB — CBC WITH DIFFERENTIAL/PLATELET
Abs Immature Granulocytes: 0.04 10*3/uL (ref 0.00–0.07)
Basophils Absolute: 0 10*3/uL (ref 0.0–0.1)
Basophils Relative: 0 %
Eosinophils Absolute: 0.1 10*3/uL (ref 0.0–0.5)
Eosinophils Relative: 1 %
HCT: 36.4 % — ABNORMAL LOW (ref 39.0–52.0)
Hemoglobin: 11.8 g/dL — ABNORMAL LOW (ref 13.0–17.0)
Immature Granulocytes: 0 %
Lymphocytes Relative: 9 %
Lymphs Abs: 0.9 10*3/uL (ref 0.7–4.0)
MCH: 27.5 pg (ref 26.0–34.0)
MCHC: 32.4 g/dL (ref 30.0–36.0)
MCV: 84.8 fL (ref 80.0–100.0)
Monocytes Absolute: 0.5 10*3/uL (ref 0.1–1.0)
Monocytes Relative: 5 %
Neutro Abs: 7.8 10*3/uL — ABNORMAL HIGH (ref 1.7–7.7)
Neutrophils Relative %: 85 %
Platelets: 315 10*3/uL (ref 150–400)
RBC: 4.29 MIL/uL (ref 4.22–5.81)
RDW: 15.4 % (ref 11.5–15.5)
WBC: 9.3 10*3/uL (ref 4.0–10.5)
nRBC: 0 % (ref 0.0–0.2)

## 2021-10-16 LAB — PROTIME-INR
INR: >10 (ref 0.8–1.2)
Prothrombin Time: >90 seconds — ABNORMAL HIGH (ref 11.4–15.2)

## 2021-10-16 LAB — COMPREHENSIVE METABOLIC PANEL
ALT: 10 U/L (ref 0–44)
AST: 32 U/L (ref 15–41)
Albumin: 3.7 g/dL (ref 3.5–5.0)
Alkaline Phosphatase: 267 U/L — ABNORMAL HIGH (ref 38–126)
Anion gap: 8 (ref 5–15)
BUN: 10 mg/dL (ref 6–20)
CO2: 25 mmol/L (ref 22–32)
Calcium: 8.8 mg/dL — ABNORMAL LOW (ref 8.9–10.3)
Chloride: 101 mmol/L (ref 98–111)
Creatinine, Ser: 0.72 mg/dL (ref 0.61–1.24)
GFR, Estimated: >60 mL/min (ref >60)
Glucose, Bld: 100 mg/dL — ABNORMAL HIGH (ref 70–99)
Potassium: 4 mmol/L (ref 3.5–5.1)
Sodium: 134 mmol/L — ABNORMAL LOW (ref 135–145)
Total Bilirubin: 0.5 mg/dL (ref 0.3–1.2)
Total Protein: 7.1 g/dL (ref 6.5–8.1)

## 2021-10-16 LAB — TYPE AND SCREEN
ABO/RH(D): O POS
Antibody Screen: NEGATIVE

## 2021-10-16 LAB — POC OCCULT BLOOD, ED: Fecal Occult Bld: NEGATIVE

## 2021-10-16 MED ORDER — SODIUM CHLORIDE 0.9 % IV BOLUS
250.0000 mL | Freq: Once | INTRAVENOUS | Status: AC
  Administered 2021-10-16: 250 mL via INTRAVENOUS

## 2021-10-16 NOTE — ED Provider Notes (Signed)
[facility] DEPT Provider Note   CSN: [id number] Arrival date & time: 10/16/21  2013     History  Chief Complaint  Patient presents with   Flank Pain    MARSHAWN NORMOYLE is a 56 y.o. male.  Patient complains of left flank pain that hurts worse when he moves around.  The history is provided by the patient and medical records. No language interpreter was used.  Flank Pain This is a new problem. The current episode started 2 days ago. The problem occurs constantly. The problem has not changed since onset.Pertinent negatives include no chest pain, no abdominal pain and no headaches. Nothing aggravates the symptoms. Nothing relieves the symptoms. He has tried nothing for the symptoms.       Home Medications Prior to Admission medications   Medication Sig Start Date End Date Taking? Authorizing Provider  albuterol (PROVENTIL HFA;VENTOLIN HFA) 108 (90 Base) MCG/ACT inhaler Inhale 1 puff into the lungs 2 (two) times daily as needed for wheezing or shortness of breath. Patient taking differently: Inhale 2 puffs into the lungs 2 (two) times daily as needed for wheezing or shortness of breath. 05/14/17   Diallo, Earna Coder, MD  benztropine (COGENTIN) 1 MG tablet Take 1 mg by mouth daily.    [provider]  buPROPion (WELLBUTRIN XL) 300 MG 24 hr tablet Take 300 mg by mouth daily.    [provider]  cetirizine (ZYRTEC) 10 MG tablet TAKE 1 TABLET BY MOUTH ONCE DAILY. Patient taking differently: Take 10 mg by mouth daily. No Therapeutic Substitution 03/28/18   Diallo, Abdoulaye, MD  fluticasone (FLONASE) 50 MCG/ACT nasal spray Place 1-2 sprays into both nostrils daily.    [provider]  gabapentin (NEURONTIN) 300 MG capsule TAKE 1 CAPSULE(300 MG) BY MOUTH TWICE DAILY 01/08/21   Jessy Oto, MD  ibuprofen (ADVIL) 200 MG tablet Take 600 mg by mouth every 6 (six) hours as needed for mild pain or headache.    [provider]   INVEGA TRINZA 819 MG/2.63ML SUSY injection Inject 819 mg into the muscle every 3 (three) months.    [provider]  Melatonin 5 MG TABS Take 10 mg by mouth at bedtime. 02/28/19   [provider]  methocarbamol (ROBAXIN) 750 MG tablet Take 1 tablet (750 mg total) by mouth every 8 (eight) hours as needed for muscle spasms. 11/18/20   Danford, Suann Larry, MD  metoprolol succinate (TOPROL-XL) 50 MG 24 hr tablet Take 50 mg by mouth daily. Take with or immediately following a meal.    [provider]  mirtazapine (REMERON) 15 MG tablet Take 15 mg by mouth at bedtime.    [provider]  Multiple Vitamins-Minerals (VITRUM SENIOR) TABS Take 1 tablet by mouth daily.  04/30/15   [provider]  omeprazole (PRILOSEC) 40 MG capsule TAKE 1 CAPSULE(40 MG) BY MOUTH DAILY 07/07/21   Jessy Oto, MD  ondansetron (ZOFRAN) 4 MG tablet Take 4 mg by mouth every 8 (eight) hours as needed for nausea or vomiting.    [provider]  oxyCODONE 10 MG TABS Take 1 tablet (10 mg total) by mouth every 6 (six) hours as needed for moderate pain or severe pain. 11/18/20   Danford, Suann Larry, MD  pantoprazole (PROTONIX) 40 MG tablet Take 1 tablet (40 mg total) by mouth daily. 02/20/20 11/03/20  Para Skeans, MD  senna-docusate (SENOKOT-S) 8.6-50 MG tablet Take 1 tablet by mouth at bedtime as needed for moderate  constipation. 11/18/20   Danford, Suann Larry, MD  thiamine 100 MG tablet Take 1 tablet (100 mg total) by mouth daily. 09/05/19   Aline August, MD      Allergies    Levofloxacin and Chlorhexidine    Review of Systems   Review of Systems  Constitutional:  Negative for appetite change and fatigue.  HENT:  Negative for congestion, ear discharge and sinus pressure.   Eyes:  Negative for discharge.  Respiratory:  Negative for cough.   Cardiovascular:  Negative for chest pain.  Gastrointestinal:  Negative for abdominal pain and diarrhea.  Genitourinary:   Positive for flank pain. Negative for frequency and hematuria.  Musculoskeletal:  Negative for back pain.  Skin:  Negative for rash.  Neurological:  Negative for seizures and headaches.  Psychiatric/Behavioral:  Negative for hallucinations.     Physical Exam Updated Vital Signs BP (!) 139/94 (BP Location: Left Arm)   Pulse (!) 103   Temp 98.4 F (36.9 C) (Oral)   Resp 16   Ht '5\' 9"'$  (1.753 m)   Wt 77.1 kg   SpO2 100%   BMI 25.10 kg/m  Physical Exam Vitals and nursing note reviewed.  Constitutional:      Appearance: He is well-developed.  HENT:     Head: Normocephalic.     Nose: Nose normal.  Eyes:     General: No scleral icterus.    Conjunctiva/sclera: Conjunctivae normal.  Neck:     Thyroid: No thyromegaly.  Cardiovascular:     Rate and Rhythm: Normal rate and regular rhythm.     Heart sounds: No murmur heard.    No friction rub. No gallop.  Pulmonary:     Breath sounds: No stridor. No wheezing or rales.  Chest:     Chest wall: No tenderness.  Abdominal:     General: There is no distension.     Tenderness: There is no abdominal tenderness. There is no rebound.  Genitourinary:    Comments: Rectal exam brown stool heme-negative Musculoskeletal:        General: Normal range of motion.     Cervical back: Neck supple.     Comments: Tender left flank  Lymphadenopathy:     Cervical: No cervical adenopathy.  Skin:    Findings: No erythema or rash.  Neurological:     Mental Status: He is alert and oriented to person, place, and time.     Motor: No abnormal muscle tone.     Coordination: Coordination normal.  Psychiatric:        Behavior: Behavior normal.     ED Results / Procedures / Treatments   Labs (all labs ordered are listed, but only abnormal results are displayed) Labs Reviewed  COMPREHENSIVE METABOLIC PANEL - Abnormal; Notable for the following components:      Result Value   Sodium 134 (*)    Glucose, Bld 100 (*)    Calcium 8.8 (*)    Alkaline  Phosphatase 267 (*)    All other components within normal limits  CBC WITH DIFFERENTIAL/PLATELET - Abnormal; Notable for the following components:   Hemoglobin 11.8 (*)    HCT 36.4 (*)    Neutro Abs 7.8 (*)    All other components within normal limits  PROTIME-INR - Abnormal; Notable for the following components:   Prothrombin Time >90.0 (*)    INR >10.0 (*)    All other components within normal limits  PROTIME-INR  POC OCCULT BLOOD, ED  TYPE AND SCREEN  EKG None  Radiology No results found.  Procedures Procedures    Medications Ordered in ED Medications  sodium chloride 0.9 % bolus 250 mL (has no administration in time range)    ED Course/ Medical Decision Making/ A&P                           Medical Decision Making This patient presents to the ED for concern of flank pain, this involves an extensive number of treatment options, and is a complaint that carries with it a high risk of complications and morbidity.  The differential diagnosis includes kidney stone or musculoskeletal   Co morbidities that complicate the patient evaluation  Bronchospasm   Additional history obtained:  Additional history obtained from patient External records from outside source obtained and reviewed including hospital record   Lab Tests:  I Ordered, and personally interpreted labs.  The pertinent results include: CBC shows anemia 11.8   Imaging Studies ordered:  I ordered imaging studies including CT abdomen I independently visualized and interpreted imaging which showed negative I agree with the radiologist interpretation   Cardiac Monitoring: / EKG:  The patient was maintained on a cardiac monitor.  I personally viewed and interpreted the cardiac monitored which showed an underlying rhythm of: Normal sinus rhythm   Consultations Obtained: No consultant Problem List / ED Course / Critical interventions / Medication management  Flank pain I ordered medication  including Toradol for pain Reevaluation of the patient after these medicines showed that the patient improved I have reviewed the patients home medicines and have made adjustments as needed   Social Determinants of Health:  None   Test / Admission - Considered:  None  Left flank pain.  Most likely muscle skeletal        Final Clinical Impression(s) / ED Diagnoses Final diagnoses:  None    Rx / DC Orders ED Discharge Orders     None         Milton Ferguson, MD 10/19/21 [phone]

## 2021-10-16 NOTE — ED Provider Triage Note (Signed)
Emergency Medicine Provider Triage Evaluation Note  [name] , a 56 y.o. male  was evaluated in triage.  Pt complains of increasing abdominal and left flank pain over the last few weeks, and black tarry stools of the last 2 to 3 days.  Hx of severe alcohol abuse, has been sober for the last 5 years.  Endorses nausea and chills, unsure of fevers.  Feels dehydrated and fatigued.  Denies vomiting, headache, vision changes, or syncope.  No known prior Hx of GI bleed.  Review of Systems  Positive:  Negative: See above  Physical Exam  BP (!) 139/94 (BP Location: Left Arm)   Pulse (!) 103   Temp 98.4 F (36.9 C) (Oral)   Resp 16   Ht '5\' 9"'$  (1.753 m)   Wt 77.1 kg   SpO2 100%   BMI 25.10 kg/m  Gen:   Awake, no distress   Resp:  Normal effort  MSK:   Moves extremities without difficulty  Other:  Diffuse abdominal pain, soft, left flank pain  Medical Decision Making  Medically screening exam initiated at 8:54 PM.  Appropriate orders placed.  [name] was informed that the remainder of the evaluation will be completed by another provider, this initial triage assessment does not replace that evaluation, and the importance of remaining in the ED until their evaluation is complete.     Prince Rome, PA-C 81/82/99 2057

## 2021-10-16 NOTE — ED Triage Notes (Signed)
Patient BIB EMS for evaluation of L flank pain x 1 month.  C/o "dark stool that is getting darker."  Hx of GERD and DM.

## 2021-10-16 NOTE — ED Notes (Signed)
Urine sample has been collected and sitting in triage nursing station.

## 2021-10-17 ENCOUNTER — Emergency Department (HOSPITAL_COMMUNITY): Payer: Medicare Other

## 2021-10-17 DIAGNOSIS — R109 Unspecified abdominal pain: Secondary | ICD-10-CM | POA: Diagnosis not present

## 2021-10-17 LAB — PROTIME-INR
INR: 1 (ref 0.8–1.2)
Prothrombin Time: 12.6 seconds (ref 11.4–15.2)

## 2021-10-17 MED ORDER — METHOCARBAMOL 500 MG PO TABS: 500.0000 mg | ORAL_TABLET | Freq: Two times a day (BID) | ORAL | 0 refills | Status: DC

## 2021-10-17 MED ORDER — IOHEXOL 300 MG/ML  SOLN
100.0000 mL | Freq: Once | INTRAMUSCULAR | Status: AC | PRN
  Administered 2021-10-17: 100 mL via INTRAVENOUS

## 2021-10-17 MED ORDER — KETOROLAC TROMETHAMINE 15 MG/ML IJ SOLN
15.0000 mg | Freq: Once | INTRAMUSCULAR | Status: AC
  Administered 2021-10-17: 15 mg via INTRAVENOUS
  Filled 2021-10-17: qty 1

## 2021-10-17 MED ORDER — METOPROLOL SUCCINATE ER 50 MG PO TB24: 50.0000 mg | ORAL_TABLET | Freq: Every day | ORAL | 0 refills | Status: DC

## 2021-10-17 NOTE — ED Provider Notes (Signed)
I assumed care of this patient.  Please see previous provider note for further details of Hx, PE.  Briefly patient is a 56 y.o. male who presented for left-sided flank pain.  Labs reassuring other than elevated PT/INR which was felt to be lab error. Currently awaiting repeat PT/INR and CT scan  PT and INR within normal limits CT scan negative Patient provided with IV Toradol for likely muscular pain.  The patient appears reasonably screened and/or stabilized for discharge and I doubt any other medical condition or other [facility] requiring further screening, evaluation, or treatment in the ED at this time prior to discharge. Safe for discharge with strict return precautions.  Disposition: Discharge  Condition: Good  I have discussed the results, Dx and Tx plan with the patient/family who expressed understanding and agree(s) with the plan. Discharge instructions discussed at length. The patient/family was given strict return precautions who verbalized understanding of the instructions. No further questions at time of discharge.    ED Discharge Orders          Ordered    metoprolol succinate (TOPROL-XL) 50 MG 24 hr tablet  Daily        10/17/21 0359    methocarbamol (ROBAXIN) 500 MG tablet  2 times daily        10/17/21 0359            Follow Up: Primary care provider   if you do not have a primary care physician, contact [facility] at [phone] for referral         Fatima Blank, MD 10/17/21 [phone]

## 2021-11-12 ENCOUNTER — Ambulatory Visit: Payer: Medicare Other | Admitting: Orthopedic Surgery

## 2021-11-26 ENCOUNTER — Ambulatory Visit: Payer: Medicare Other | Admitting: Orthopedic Surgery

## 2021-12-03 ENCOUNTER — Ambulatory Visit (INDEPENDENT_AMBULATORY_CARE_PROVIDER_SITE_OTHER): Payer: Medicare Other

## 2021-12-03 ENCOUNTER — Ambulatory Visit (INDEPENDENT_AMBULATORY_CARE_PROVIDER_SITE_OTHER): Payer: Medicare Other | Admitting: Surgical

## 2021-12-03 ENCOUNTER — Encounter: Payer: Self-pay | Admitting: Orthopedic Surgery

## 2021-12-03 DIAGNOSIS — M25512 Pain in left shoulder: Secondary | ICD-10-CM

## 2021-12-03 DIAGNOSIS — M19012 Primary osteoarthritis, left shoulder: Secondary | ICD-10-CM

## 2021-12-03 NOTE — Progress Notes (Signed)
Office Visit Note   Patient: [name]           Date of Birth: 05-Nov-1965           MRN: [id number] Visit Date: 12/03/2021 Requested by: Reece Leader, DO [address] [place],  [place] [address] PCP: Reece Leader, DO  Subjective: Chief Complaint  Patient presents with   Left Shoulder - Pain    HPI: [name] is a 56 y.o. male who presents to the office complaining of left shoulder pain.  Patient notes history of shoulder pain for several years without injury.  Over the last 4 to 5 months he has had increasing pain.  Pain wakes him up at night every night.  He has difficulty with ADLs and decreased range of motion.  Takes ibuprofen and Tylenol without much relief.  He has had prior cortisone injections in the shoulder with no lasting relief.  No history of prior surgery to the shoulder.  Localizes most of the pain to the anterior and posterior aspects of the shoulder.  He will have occasional radiation of pain down the posterior aspect of the arm with some associated numbness and tingling.  No neck pain.  No scapular pain.  Ambulates with a walker.  No history of diabetes.  No blood thinner use.  He is a smoker and smokes about 1 pack/day. .                ROS: All systems reviewed are negative as they relate to the chief complaint within the history of present illness.  Patient denies fevers or chills.  Assessment & Plan: Visit Diagnoses:  1. Glenohumeral arthritis, left   2. Left shoulder pain, unspecified chronicity     Plan: Patient is a 56 year old male who presents for evaluation of left shoulder pain.  He has history of left shoulder glenohumeral arthritis with significant posterior wear of the glenoid based on radiographs today.  May be severe enough that reverse shoulder arthroplasty is not an option surgically for him which may necessitate the need for vault reconstruction system.  He has had prior cortisone injections without lasting relief.  He is at the point where  he wants to consider some sort of definitive surgical option for his shoulder.  Plan to order thin cut CT scan of the left shoulder for preoperative planning purposes and further evaluation of current glenoid morphology.  Follow-up after CT scan to review results with Dr. August Saucer.  May be some contribution of cervical spine pathology based on occasional radicular pain but seems most of his pain is related to the shoulder itself.  Follow-Up Instructions: No follow-ups on file.   Orders:  Orders Placed This Encounter  Procedures   XR Shoulder Left   CT SHOULDER LEFT WO CONTRAST   No orders of the defined types were placed in this encounter.     Procedures: No procedures performed   Clinical Data: No additional findings.  Objective: Vital Signs: There were no vitals taken for this visit.  Physical Exam:  Constitutional: Patient appears well-developed HEENT:  Head: Normocephalic Eyes:EOM are normal Neck: Normal range of motion Cardiovascular: Normal rate Pulmonary/chest: Effort normal Neurologic: Patient is alert Skin: Skin is warm Psychiatric: Patient has normal mood and affect  Ortho Exam: Ortho exam demonstrates left shoulder with 90 degrees external rotation, 90 degrees abduction, 150 degrees forward flexion.  Compared with the right shoulder which has 70 degrees external rotation, 75 degrees abduction, 170 degrees forward flexion.  Left subscapularis with 5 -/5 strength.  Supraspinatus with 4/5 strength.  Infraspinatus with 3/5 strength.  Moderate tenderness over the bicipital groove.  No tenderness over the [facility] joint.  He is able to actively forward flex and AB duct the shoulder equivalent to the passive motion.  Axillary nerve is intact with deltoid firing.  He has intact EPL, FPL, finger abduction, finger adduction, pronation/supination, bicep, tricep, deltoid.  No tenderness throughout the axial cervical spine.  No pain with cervical spine range of motion.  Negative Lhermitte  sign.  Negative Spurling sign.  Specialty Comments:  No specialty comments available.  Imaging: No results found.   PMFS History: Patient Active Problem List   Diagnosis Date Noted   Discitis 12/16/2020   Medication monitoring encounter 12/16/2020   Epidural abscess 11/03/2020   Ankle ankylosis, left    Closed fracture of one rib of left side    Discitis of lumbar region    Osteomyelitis (HCC) 01/07/2020   Acute osteomyelitis of lumbar spine (HCC) 08/16/2019   Altered mental status    Delirium    Toxic metabolic encephalopathy 07/26/2019   Polysubstance overdose 07/26/2019   Acute kidney injury (HCC) 07/26/2019   Chronic pain syndrome 07/26/2019   SIRS (systemic inflammatory response syndrome) (HCC) 07/26/2019   Spinal stenosis, lumbar region, with neurogenic claudication 04/11/2019    Class: Chronic   Lumbar disc herniation 04/11/2019    Class: Chronic   Status post lumbar spinal fusion 04/11/2019   Hardware complicating wound infection (HCC) 01/31/2018   Spondylolisthesis of lumbar region 12/30/2017   S/P ankle fusion 10/20/2017   Post-traumatic osteoarthritis, left ankle and foot 01/07/2017   Primary osteoarthritis of right knee 05/27/2016   GERD (gastroesophageal reflux disease) 07/09/2015   Mild persistent asthma 07/09/2015   Type 2 diabetes mellitus (HCC) 07/09/2015   Polyp of duodenum 06/04/2015   Alcohol dependence (HCC) 02/25/2015   Moderate episode of recurrent major depressive disorder (HCC) 02/25/2015   Upper GI bleed 02/18/2015   Benign essential HTN 02/18/2015   Anemia of chronic disease 02/18/2015   Pulmonary nodule 02/18/2015   Cocaine abuse (HCC) 03/02/2014   Posttraumatic stress disorder 03/02/2014   History of CVA (cerebrovascular accident) 01/12/2014   Schizophrenia (HCC) 01/12/2014   Nicotine dependence, cigarettes, uncomplicated 12/06/2013   Past Medical History:  Diagnosis Date   Anxiety    Arthritis    Asthma    Depression    Duodenal  adenoma    GERD (gastroesophageal reflux disease)    Hypertension    Pneumonia    hx   Post-traumatic osteoarthritis of left ankle    Pre-diabetes    Presence of right artificial knee joint 12/17/2016   Schizophrenia (HCC)    Stroke (HCC)    2009-or10    Family History  Adopted: Yes  Problem Relation Age of Onset   Colon cancer Neg Hx    Esophageal cancer Neg Hx    Rectal cancer Neg Hx    Stomach cancer Neg Hx     Past Surgical History:  Procedure Laterality Date   ANKLE ARTHROSCOPY Left 10/20/2017   Procedure: ANKLE ARTHROSCOPY;  Surgeon: Nadara Mustard, MD;  Location: [facility] OR;  Service: Orthopedics;  Laterality: Left;   ANKLE FUSION Left 10/20/2017   ANKLE FUSION Left 10/20/2017   Procedure: LEFT ANKLE FUSION;  Surgeon: Nadara Mustard, MD;  Location: [facility] OR;  Service: Orthopedics;  Laterality: Left;   ESOPHAGOGASTRODUODENOSCOPY Left 12/05/2013   Procedure: ESOPHAGOGASTRODUODENOSCOPY (EGD);  Surgeon: Willis Modena, MD;  Location: [facility] ENDOSCOPY;  Service: Endoscopy;  Laterality: Left;   ESOPHAGOGASTRODUODENOSCOPY (EGD) WITH PROPOFOL N/A 02/20/2015   Procedure: ESOPHAGOGASTRODUODENOSCOPY (EGD) WITH PROPOFOL;  Surgeon: Charlott Rakes, MD;  Location: [facility] ENDOSCOPY;  Service: Endoscopy;  Laterality: N/A;   HARDWARE REMOVAL Left 01/29/2018   Procedure: HARDWARE REMOVAL LEFT ANKLE;  Surgeon: Nadara Mustard, MD;  Location: [facility] OR;  Service: Orthopedics;  Laterality: Left;   IR LUMBAR DISC ASPIRATION W/IMG GUIDE  01/08/2020   JOINT REPLACEMENT     THORACIC LAMINECTOMY FOR EPIDURAL ABSCESS N/A 11/03/2020   Procedure: LUMBAR THREE AND LUMBAR  FOUR LAMINECTOMY FOR EVACUATION EPIDURAL ABSCESS;  Surgeon: Jadene Pierini, MD;  Location: [facility] OR;  Service: Neurosurgery;  Laterality: N/A;   TOTAL KNEE ARTHROPLASTY Right 07/21/2016   Procedure: TOTAL KNEE ARTHROPLASTY;  Surgeon: Cammy Copa, MD;  Location: [facility] OR;  Service: Orthopedics;  Laterality: Right;   UPPER GASTROINTESTINAL ENDOSCOPY      Social History   Occupational History   Not on file  Tobacco Use   Smoking status: Heavy Smoker    Packs/day: 1.50    Years: 38.00    Total pack years: 57.00    Types: Cigarettes   Smokeless tobacco: Never   Tobacco comments:    onset age 29; upto 1.5 ppd  Vaping Use   Vaping Use: Never used  Substance and Sexual Activity   Alcohol use: Not Currently    Alcohol/week: 3.0 standard drinks of alcohol    Types: 3 Cans of beer per week    Comment: quit 2015; formerly up to 2 fifths/day   Drug use: Yes    Comment: Heroin    Sexual activity: Yes    Comment: declined condoms

## 2021-12-15 ENCOUNTER — Ambulatory Visit
Admission: RE | Admit: 2021-12-15 | Discharge: 2021-12-15 | Disposition: A | Discharge status: Home / Self Care | Payer: Medicare Other | Source: Ambulatory Visit | Attending: Family Medicine | Admitting: Family Medicine

## 2021-12-15 ENCOUNTER — Other Ambulatory Visit: Payer: Self-pay | Admitting: Family Medicine

## 2021-12-15 DIAGNOSIS — M25571 Pain in right ankle and joints of right foot: Secondary | ICD-10-CM

## 2021-12-17 ENCOUNTER — Other Ambulatory Visit: Payer: Medicare Other

## 2021-12-29 ENCOUNTER — Ambulatory Visit: Payer: Medicare Other | Admitting: Orthopedic Surgery

## 2022-01-09 ENCOUNTER — Other Ambulatory Visit: Payer: Medicare Other

## 2022-01-12 ENCOUNTER — Ambulatory Visit: Payer: Medicare Other | Admitting: Orthopedic Surgery

## 2022-01-26 ENCOUNTER — Ambulatory Visit: Payer: Medicare Other | Admitting: Orthopedic Surgery

## 2022-01-27 NOTE — Progress Notes (Signed)
Office Visit Note   Patient: [name]           Date of Birth: 1965/08/09           MRN: [id number] Visit Date: 04/06/2019              Requested by: [name], MD No address on file PCP: Reece Leader, DO   Assessment & Plan: Visit Diagnoses:  1. Lumbosacral radiculopathy   2. Spondylolisthesis, lumbar region   3. Spinal stenosis of lumbar region with neurogenic claudication     Plan: Avoid bending, stooping and avoid lifting weights greater than 10 lbs. Avoid prolong standing and walking. Order for a new walker with wheels. Surgery scheduling secretary Tivis Ringer, will call you in the next week to schedule for surgery.  Surgery recommended is a one level lumbar fusion L4-5 this would be done with rods, screws and cages with local bone graft and allograft (donor bone graft). Take hydrocodone for for pain. Risk of surgery includes risk of infection 1 in 200 patients, bleeding 1/2% chance you would need a transfusion.   Risk to the nerves is one in 10,000. You will need to use a brace for 3 months and wean from the brace on the 4th month. Expect improved walking and standing tolerance. Expect relief of leg pain but numbness may persist depending on the length and degree of pressure that has been present.    Follow-Up Instructions: No follow-ups on file.   Orders:  No orders of the defined types were placed in this encounter.  No orders of the defined types were placed in this encounter.     Procedures: No procedures performed   Clinical Data: No additional findings.   Subjective: Chief Complaint  Patient presents with  . Lower Back - Follow-up    56 year old male with spondylolisthesis and severe spinal stenosis, difficulty with prolong standing and walking. Cannot walk more than 100-150 feet before he has to sit or stoop over. No bowel or bladder difficulty. Has undergone evaluation and I have recommended decompression and fusion for severe  spinal stenosis associated with spondylolisthesis L4-5. Pain into the legs with prolong standing and walking, has to lean on carts for shopping. He was last seen months ago and I recommend a preop assessment to document his exam prior to surgery.     Review of Systems  Constitutional: Negative.   HENT: Negative.    Eyes: Negative.   Respiratory: Negative.    Cardiovascular: Negative.   Gastrointestinal: Negative.   Endocrine: Negative.   Genitourinary: Negative.   Musculoskeletal: Negative.   Skin: Negative.   Allergic/Immunologic: Negative.   Neurological: Negative.   Hematological: Negative.   Psychiatric/Behavioral: Negative.       Objective: Vital Signs: BP 117/80 (BP Location: Left Arm, Patient Position: Sitting)   Pulse (!) 131   Ht 5\' 9"  (1.753 m)   Wt 230 lb (104.3 kg)   BMI 33.97 kg/m   Physical Exam Constitutional:      Appearance: He is well-developed.  HENT:     Head: Normocephalic and atraumatic.  Eyes:     Pupils: Pupils are equal, round, and reactive to light.  Pulmonary:     Effort: Pulmonary effort is normal.     Breath sounds: Normal breath sounds.  Abdominal:     General: Bowel sounds are normal.     Palpations: Abdomen is soft.  Musculoskeletal:     Cervical back: Normal range of motion and  neck supple.     Lumbar back: Negative right straight leg raise test and negative left straight leg raise test.  Skin:    General: Skin is warm and dry.  Neurological:     Mental Status: He is alert and oriented to person, place, and time.  Psychiatric:        Behavior: Behavior normal.        Thought Content: Thought content normal.        Judgment: Judgment normal.    Back Exam   Tenderness  The patient is experiencing tenderness in the lumbar.  Range of Motion  Extension:  abnormal  Flexion:  abnormal  Lateral bend right:  abnormal  Lateral bend left:  abnormal  Rotation right:  abnormal  Rotation left:  abnormal   Muscle Strength  Right  Quadriceps:  5/5  Left Quadriceps:  5/5  Right Hamstrings:  5/5  Left Hamstrings:  5/5   Tests  Straight leg raise right: negative Straight leg raise left: negative  Reflexes  Patellar:  1/4 Achilles:  1/4  Other  Toe walk: abnormal Heel walk: abnormal Sensation: decreased Gait: abnormal   Comments:  Weakness left and right foot DF 4/5, SLR is negative     Specialty Comments:  No specialty comments available.  Imaging: No results found.   PMFS History: Patient Active Problem List   Diagnosis Date Noted  . Spinal stenosis, lumbar region, with neurogenic claudication 04/11/2019    Priority: High    Class: Chronic  . Lumbar disc herniation 04/11/2019    Priority: High    Class: Chronic  . Discitis 12/16/2020  . Medication monitoring encounter 12/16/2020  . Epidural abscess 11/03/2020  . Ankle ankylosis, left   . Closed fracture of one rib of left side   . Discitis of lumbar region   . Osteomyelitis (HCC) 01/07/2020  . Acute osteomyelitis of lumbar spine (HCC) 08/16/2019  . Altered mental status   . Delirium   . Toxic metabolic encephalopathy 07/26/2019  . Polysubstance overdose 07/26/2019  . Acute kidney injury (HCC) 07/26/2019  . Chronic pain syndrome 07/26/2019  . SIRS (systemic inflammatory response syndrome) (HCC) 07/26/2019  . Status post lumbar spinal fusion 04/11/2019  . Hardware complicating wound infection (HCC) 01/31/2018  . Spondylolisthesis of lumbar region 12/30/2017  . S/P ankle fusion 10/20/2017  . Post-traumatic osteoarthritis, left ankle and foot 01/07/2017  . Primary osteoarthritis of right knee 05/27/2016  . GERD (gastroesophageal reflux disease) 07/09/2015  . Mild persistent asthma 07/09/2015  . Type 2 diabetes mellitus (HCC) 07/09/2015  . Polyp of duodenum 06/04/2015  . Alcohol dependence (HCC) 02/25/2015  . Moderate episode of recurrent major depressive disorder (HCC) 02/25/2015  . Upper GI bleed 02/18/2015  . Benign essential HTN  02/18/2015  . Anemia of chronic disease 02/18/2015  . Pulmonary nodule 02/18/2015  . Cocaine abuse (HCC) 03/02/2014  . Posttraumatic stress disorder 03/02/2014  . History of CVA (cerebrovascular accident) 01/12/2014  . Schizophrenia (HCC) 01/12/2014  . Nicotine dependence, cigarettes, uncomplicated 12/06/2013   Past Medical History:  Diagnosis Date  . Anxiety   . Arthritis   . Asthma   . Depression   . Duodenal adenoma   . GERD (gastroesophageal reflux disease)   . Hypertension   . Pneumonia    hx  . Post-traumatic osteoarthritis of left ankle   . Pre-diabetes   . Presence of right artificial knee joint 12/17/2016  . Schizophrenia (HCC)   . Stroke [facility])    2009-or10  Family History  Adopted: Yes  Problem Relation Age of Onset  . Colon cancer Neg Hx   . Esophageal cancer Neg Hx   . Rectal cancer Neg Hx   . Stomach cancer Neg Hx     Past Surgical History:  Procedure Laterality Date  . ANKLE ARTHROSCOPY Left 10/20/2017   Procedure: ANKLE ARTHROSCOPY;  Surgeon: Nadara Mustard, MD;  Location: [facility] OR;  Service: Orthopedics;  Laterality: Left;  . ANKLE FUSION Left 10/20/2017  . ANKLE FUSION Left 10/20/2017   Procedure: LEFT ANKLE FUSION;  Surgeon: Nadara Mustard, MD;  Location: [facility] OR;  Service: Orthopedics;  Laterality: Left;  . ESOPHAGOGASTRODUODENOSCOPY Left 12/05/2013   Procedure: ESOPHAGOGASTRODUODENOSCOPY (EGD);  Surgeon: Willis Modena, MD;  Location: [facility];  Service: Endoscopy;  Laterality: Left;  . ESOPHAGOGASTRODUODENOSCOPY (EGD) WITH PROPOFOL N/A 02/20/2015   Procedure: ESOPHAGOGASTRODUODENOSCOPY (EGD) WITH PROPOFOL;  Surgeon: Charlott Rakes, MD;  Location: [facility];  Service: Endoscopy;  Laterality: N/A;  . HARDWARE REMOVAL Left 01/29/2018   Procedure: HARDWARE REMOVAL LEFT ANKLE;  Surgeon: Nadara Mustard, MD;  Location: [facility] OR;  Service: Orthopedics;  Laterality: Left;  . IR LUMBAR DISC ASPIRATION W/IMG GUIDE  01/08/2020  . JOINT REPLACEMENT    . THORACIC  LAMINECTOMY FOR EPIDURAL ABSCESS N/A 11/03/2020   Procedure: LUMBAR THREE AND LUMBAR  FOUR LAMINECTOMY FOR EVACUATION EPIDURAL ABSCESS;  Surgeon: Jadene Pierini, MD;  Location: [facility] OR;  Service: Neurosurgery;  Laterality: N/A;  . TOTAL KNEE ARTHROPLASTY Right 07/21/2016   Procedure: TOTAL KNEE ARTHROPLASTY;  Surgeon: Cammy Copa, MD;  Location: [facility] OR;  Service: Orthopedics;  Laterality: Right;  . UPPER GASTROINTESTINAL ENDOSCOPY     Social History   Occupational History  . Not on file  Tobacco Use  . Smoking status: Heavy Smoker    Packs/day: 1.50    Years: 38.00    Total pack years: 57.00    Types: Cigarettes  . Smokeless tobacco: Never  . Tobacco comments:    onset age 61; upto 1.5 ppd  Vaping Use  . Vaping Use: Never used  Substance and Sexual Activity  . Alcohol use: Not Currently    Alcohol/week: 3.0 standard drinks of alcohol    Types: 3 Cans of beer per week    Comment: quit 2015; formerly up to 2 fifths/day  . Drug use: Yes    Comment: Heroin   . Sexual activity: Yes    Comment: declined condoms

## 2022-01-27 NOTE — Patient Instructions (Signed)
Plan: Avoid bending, stooping and avoid lifting weights greater than 10 lbs. Avoid prolong standing and walking. Order for a new walker with wheels. Surgery scheduling secretary Kandice Hams, will call you in the next week to schedule for surgery.  Surgery recommended is a one level lumbar fusion L4-5 this would be done with rods, screws and cages with local bone graft and allograft (donor bone graft). Take hydrocodone for for pain. Risk of surgery includes risk of infection 1 in 200 patients, bleeding 1/2% chance you would need a transfusion.   Risk to the nerves is one in 10,000. You will need to use a brace for 3 months and wean from the brace on the 4th month. Expect improved walking and standing tolerance. Expect relief of leg pain but numbness may persist depending on the length and degree of pressure that has been present.

## 2022-02-06 ENCOUNTER — Other Ambulatory Visit: Payer: Medicare Other

## 2022-02-18 ENCOUNTER — Ambulatory Visit: Payer: Medicare Other | Admitting: Orthopedic Surgery

## 2022-03-13 ENCOUNTER — Other Ambulatory Visit: Payer: Medicare Other

## 2022-03-16 ENCOUNTER — Ambulatory Visit: Payer: Medicare Other | Admitting: Orthopedic Surgery

## 2022-04-23 ENCOUNTER — Other Ambulatory Visit: Payer: Medicare Other

## 2022-04-29 ENCOUNTER — Ambulatory Visit: Payer: Medicare Other | Admitting: Orthopedic Surgery

## 2022-05-20 ENCOUNTER — Inpatient Hospital Stay: Admission: RE | Admit: 2022-05-20 | Payer: 59 | Source: Ambulatory Visit

## 2022-09-13 NOTE — H&P (Signed)
  Patient: [name]  PID: [id number]  DOB: 1966-01-02  SEX: Male   Patient referred by DDS for extraction remaining teeth and prepare for dentures  CC: Bad teeth  Past Medical History:  High Blood Pressure, Pneumonia, bronchitis, chronic, Smoker, Schizophrenia, Stomach Ulcers, Bruise Easily, Stroke    Medications: Protonix, Metoprolol    Allergies: Chlorhexidine, Levofloxacin         Surgeries:   back surgery, Ankle surgery, knee surgery     Social History       Smoking: 1 ppd           Alcohol:n Drug use: n                            Exam: BMI 28. Edentulous maxilla. Hyperplastic left maxillary osseous tuberosity. Grossly decayed mandibular teeth # 18, 19, 20, 22, 27, 28, 29, 31, bilateral mandibular lingual tori.  No purulence, edema, fluctuance, trismus. Oral cancer screening negative. Pharynx clear. No lymphadenopathy.  Panorex:Edentulous maxilla.  Grossly decayed mandibular teeth # 18, 19, 20, 22, 27, 28, 29, 31,  Assessment: ASA 3. Non-restorable  teeth # 18, 19, 20, 22, 27, 28, 29, 31,Hyperplastic left maxillary osseous tuberosity, bilateral mandibular lingual tori.             Plan: Extraction Teeth # 18, 19, 20, 22, 27, 28, 29, 31, alveoloplasty, reduction hyperplastic left maxillary osseous tuberosity, removal bilateral mandibular lingual tori.   Hospital Day surgery.                 Rx: n               Risks and complications explained. Questions answered.   Georgia Lopes, DMD

## 2022-09-14 NOTE — Progress Notes (Signed)
Unable to reach by phone for pre-op call. Both numbers listed are not in service. I called Dr. Randa Evens office and they had a number that pt had given them in the past week and I called it [phone]) and the person who answered said that this number is [facility] and they don't know any one named [employer]. I have notified Dr. Randa Evens office.

## 2022-09-29 ENCOUNTER — Encounter (HOSPITAL_COMMUNITY): Payer: Self-pay | Admitting: Oral Surgery

## 2022-09-29 ENCOUNTER — Other Ambulatory Visit: Payer: Self-pay

## 2022-09-29 NOTE — Progress Notes (Signed)
Spoke with pt for pre-op call. Pt denies cardiac history, but states he had a stroke in 2009-2010 and has lots of "nerve damage" from it. Pt is treated for HTN.   Pt will be arriving Medicaid transport, pt's wife is coming with him. They will return home by [facility] transport.   Shower instructions given to pt.

## 2022-09-30 NOTE — Progress Notes (Addendum)
Patient's wife call and states that the patient will not be able to come Friday morning for surgery because they couldn't find transportation. Patient's wife states that they want to cancel the surgery on Friday.  Wife was instructed to call Dr. Randa Evens office and let them know that they want to cancel the surgery. Wife verbalized understanding. OR was notified.

## 2022-10-02 ENCOUNTER — Ambulatory Visit (HOSPITAL_COMMUNITY): Admission: RE | Admit: 2022-10-02 | Payer: 59 | Source: Home / Self Care | Admitting: Oral Surgery

## 2022-10-02 HISTORY — DX: Headache, unspecified: R51.9

## 2022-10-02 HISTORY — DX: Inflammatory liver disease, unspecified: K75.9

## 2022-10-02 SURGERY — DENTAL RESTORATION/EXTRACTIONS
Anesthesia: General

## 2022-12-29 NOTE — H&P (Signed)
  Patient: [name]  PID: [id number]  DOB: 1966-01-02  SEX: Male   Patient referred by DDS for extraction remaining teeth and prepare for dentures  CC: Bad teeth  Past Medical History:  High Blood Pressure, Pneumonia, bronchitis, chronic, Smoker, Schizophrenia, Stomach Ulcers, Bruise Easily, Stroke    Medications: Protonix, Metoprolol    Allergies: Chlorhexidine, Levofloxacin         Surgeries:   back surgery, Ankle surgery, knee surgery     Social History       Smoking: 1 ppd           Alcohol:n Drug use: n                            Exam: BMI 28. Edentulous maxilla. Hyperplastic left maxillary osseous tuberosity. Grossly decayed mandibular teeth # 18, 19, 20, 22, 27, 28, 29, 31, bilateral mandibular lingual tori.  No purulence, edema, fluctuance, trismus. Oral cancer screening negative. Pharynx clear. No lymphadenopathy.  Panorex:Edentulous maxilla.  Grossly decayed mandibular teeth # 18, 19, 20, 22, 27, 28, 29, 31,  Assessment: ASA 3. Non-restorable  teeth # 18, 19, 20, 22, 27, 28, 29, 31,Hyperplastic left maxillary osseous tuberosity, bilateral mandibular lingual tori.             Plan: Extraction Teeth # 18, 19, 20, 22, 27, 28, 29, 31, alveoloplasty, reduction hyperplastic left maxillary osseous tuberosity, removal bilateral mandibular lingual tori.   Hospital Day surgery.                 Rx: n               Risks and complications explained. Questions answered.   Georgia Lopes, DMD

## 2022-12-30 ENCOUNTER — Encounter (HOSPITAL_COMMUNITY): Payer: Self-pay | Admitting: Oral Surgery

## 2022-12-30 ENCOUNTER — Other Ambulatory Visit: Payer: Self-pay

## 2022-12-30 NOTE — Anesthesia Preprocedure Evaluation (Addendum)
Anesthesia Evaluation  Patient identified by MRN, date of birth, ID band Patient awake    Reviewed: Allergy & Precautions, NPO status , Patient's Chart, lab work & pertinent test results  Airway Mallampati: I  TM Distance: >3 FB Neck ROM: Full    Dental  (+) Missing, Poor Dentition   Pulmonary asthma , Current SmokerPatient did not abstain from smoking.   Pulmonary exam normal        Cardiovascular hypertension, Pt. on home beta blockers Normal cardiovascular exam     Neuro/Psych  Headaches PSYCHIATRIC DISORDERS Anxiety Depression  Schizophrenia  CVA    GI/Hepatic negative GI ROS,,,(+)     substance abuse  , Hepatitis -  Endo/Other  negative endocrine ROS    Renal/GU Renal disease     Musculoskeletal  (+) Arthritis ,    Abdominal   Peds  Hematology negative hematology ROS (+)   Anesthesia Other Findings   Reproductive/Obstetrics                              Anesthesia Physical Anesthesia Plan  ASA: 3  Anesthesia Plan: General   Post-op Pain Management:    Induction: Intravenous  PONV Risk Score and Plan: 1 and Ondansetron, Dexamethasone, Midazolam and Treatment may vary due to age or medical condition  Airway Management Planned: Nasal ETT  Additional Equipment:   Intra-op Plan:   Post-operative Plan: Extubation in OR  Informed Consent: I have reviewed the patients History and Physical, chart, labs and discussed the procedure including the risks, benefits and alternatives for the proposed anesthesia with the patient or authorized representative who has indicated his/her understanding and acceptance.     Dental advisory given  Plan Discussed with: CRNA  Anesthesia Plan Comments: (PAT note written 12/30/2022 by Shonna Chock, PA-C.  )        Anesthesia Quick Evaluation

## 2022-12-30 NOTE — Progress Notes (Signed)
Anesthesia Chart Review: [name]  Case: [id number] Date/Time: 01/01/23 1027   Procedure: DENTAL RESTORATION/EXTRACTIONS   Anesthesia type: General   Pre-op diagnosis: NON RESTORABLE   [facility] RO[facility] 04 / [facility]   Surgeons: Ocie Doyne, DMD       DISCUSSION: Patient is a 57 year old male scheduled for the above procedure.  Appears procedure was postponed from July due to transportation issues.    History includes smoking, HTN, asthma, GERD, anxiety, depression (with history of SI/HI), PTSD, Hepatitis C, pre-diabetes, TIA (Admitted 09/2007 for left sided numbness, possible TIA, no acute infarct on MRA), osteoarthritis (right TKA 07/21/16; left ankle fusion 10/20/17 with hardware removal 01/29/18), spinal surgery L4-5 TLIF 04/11/19), polysubstance abuse (primarily tobacco and alcohol, reportedly sober from alcohol since 2015 although elevated ethyl alcohol level 11/21/18; UDS +cocaine 8/722, last marijuana and heroin documented as 2022).   He has a same-day workup, so anesthesia team to evaluate on the day of surgery.  Labs and EKG on arrival was indicated.   VS: Ht 5\' 9"  (1.753 m)   Wt 86.2 kg   BMI 28.06 kg/m   PROVIDERS: PCP is through "[facility]"   LABS: For day of surgery as indicated.   EKG: Last EKG seen is < 1 year ago.   CV: Echo 07/28/19: IMPRESSIONS   1. Left ventricular ejection fraction, by estimation, is 50 to 55%. The  left ventricle has low normal function. The left ventricle has no regional  wall motion abnormalities. The left ventricular internal cavity size was  mildly dilated. There is mild  left ventricular hypertrophy. Left ventricular diastolic parameters were  normal.   2. Right ventricular systolic function is normal. The right ventricular  size is normal. There is normal pulmonary artery systolic pressure.   3. Left atrial size was mildly dilated.   4. The mitral valve is normal in structure. Trivial mitral valve  regurgitation. No  evidence of mitral stenosis.   5. The aortic valve is tricuspid. Aortic valve regurgitation is not  visualized. Mild aortic valve sclerosis is present, with no evidence of  aortic valve stenosis.   6. The inferior vena cava is normal in size with greater than 50%  respiratory variability, suggesting right atrial pressure of 3 mmHg.    Past Medical History:  Diagnosis Date   Ambulates with cane    straight cane   Anxiety    Arthritis    Asthma    Depression    Duodenal adenoma    GERD (gastroesophageal reflux disease)    Headache    Hepatitis    Hepatitis C -   Hypertension    Pneumonia    hx   Post-traumatic osteoarthritis of left ankle    Pre-diabetes    hgba1c on 09/2020 was 5.9   Presence of right artificial knee joint 12/17/2016   Schizophrenia (HCC) 07/2020   from [facility]   Stroke [facility])    2009-or10   Substance abuse (HCC) 07/2020   heroin abuse from [facility]    Past Surgical History:  Procedure Laterality Date   ANKLE ARTHROSCOPY Left 10/20/2017   Procedure: ANKLE ARTHROSCOPY;  Surgeon: Nadara Mustard, MD;  Location: [facility] OR;  Service: Orthopedics;  Laterality: Left;   ANKLE FUSION Left 10/20/2017   ANKLE FUSION Left 10/20/2017   Procedure: LEFT ANKLE FUSION;  Surgeon: Nadara Mustard, MD;  Location: [facility] OR;  Service: Orthopedics;  Laterality: Left;   ESOPHAGOGASTRODUODENOSCOPY Left 12/05/2013   Procedure: ESOPHAGOGASTRODUODENOSCOPY (EGD);  Surgeon: Willis Modena, MD;  Location: [facility];  Service: Endoscopy;  Laterality: Left;   ESOPHAGOGASTRODUODENOSCOPY (EGD) WITH PROPOFOL N/A 02/20/2015   Procedure: ESOPHAGOGASTRODUODENOSCOPY (EGD) WITH PROPOFOL;  Surgeon: Charlott Rakes, MD;  Location: [facility];  Service: Endoscopy;  Laterality: N/A;   HARDWARE REMOVAL Left 01/29/2018   Procedure: HARDWARE REMOVAL LEFT ANKLE;  Surgeon: Nadara Mustard, MD;  Location: [facility] OR;  Service: Orthopedics;  Laterality: Left;   IR LUMBAR DISC ASPIRATION W/IMG GUIDE  01/08/2020   JOINT  REPLACEMENT     THORACIC LAMINECTOMY FOR EPIDURAL ABSCESS N/A 11/03/2020   Procedure: LUMBAR THREE AND LUMBAR  FOUR LAMINECTOMY FOR EVACUATION EPIDURAL ABSCESS;  Surgeon: Jadene Pierini, MD;  Location: [facility] OR;  Service: Neurosurgery;  Laterality: N/A;   TOTAL KNEE ARTHROPLASTY Right 07/21/2016   Procedure: TOTAL KNEE ARTHROPLASTY;  Surgeon: Cammy Copa, MD;  Location: [facility] OR;  Service: Orthopedics;  Laterality: Right;   UPPER GASTROINTESTINAL ENDOSCOPY      MEDICATIONS: No current facility-administered medications for this encounter.    acetaminophen (TYLENOL) 500 MG tablet   albuterol (PROVENTIL HFA;VENTOLIN HFA) 108 (90 Base) MCG/ACT inhaler   famotidine (PEPCID) 20 MG tablet   fluticasone (FLONASE) 50 MCG/ACT nasal spray   ibuprofen (ADVIL) 800 MG tablet   metoprolol succinate (TOPROL-XL) 50 MG 24 hr tablet   ondansetron (ZOFRAN) 4 MG tablet   SUMAtriptan (IMITREX) 50 MG tablet   cetirizine (ZYRTEC) 10 MG tablet   INVEGA TRINZA 819 MG/2.63ML SUSY injection    Shonna Chock, PA-C Surgical Short Stay/Anesthesiology [facility] Phone [phone] [facility] Phone [phone] 12/30/2022 4:00 PM

## 2022-12-30 NOTE — Progress Notes (Signed)
PCP - [facility] - none  Chest x-ray - n/a EKG - DOS Stress Test - n/a ECHO - 07/28/19 Cardiac Cath - n/a  ICD Pacemaker/Loop - n/a  Sleep Study -  n/a CPAP - none  Diabetes - n/a  NPO  Anesthesia review: Yes  STOP now taking any Aspirin (unless otherwise instructed by your surgeon), Aleve, Naproxen, Ibuprofen, Motrin, Advil, Goody's, BC's, all herbal medications, fish oil, and all vitamins.   Coronavirus Screening Do you have any of the following symptoms:  Cough yes/no: No Fever (>100.62F)  yes/no: No Runny nose yes/no: No Sore throat yes/no: No Difficulty breathing/shortness of breath  yes/no: No  Have you traveled in the last 14 days and where? yes/no: No  S.O. Carol Allegrezza verbalized understanding of instructions that were given via phone.

## 2023-01-01 ENCOUNTER — Other Ambulatory Visit (HOSPITAL_COMMUNITY): Payer: Self-pay

## 2023-01-01 ENCOUNTER — Encounter (HOSPITAL_COMMUNITY)
Admission: RE | Disposition: A | Discharge status: Home / Self Care | Payer: Self-pay | Source: Home / Self Care | Attending: Oral Surgery

## 2023-01-01 ENCOUNTER — Other Ambulatory Visit: Payer: Self-pay

## 2023-01-01 ENCOUNTER — Ambulatory Visit (HOSPITAL_COMMUNITY)
Admission: RE | Admit: 2023-01-01 | Discharge: 2023-01-01 | Disposition: A | Discharge status: Home / Self Care | Payer: 59 | Attending: Oral Surgery | Admitting: Oral Surgery

## 2023-01-01 ENCOUNTER — Ambulatory Visit (HOSPITAL_BASED_OUTPATIENT_CLINIC_OR_DEPARTMENT_OTHER): Payer: 59 | Admitting: Vascular Surgery

## 2023-01-01 ENCOUNTER — Encounter (HOSPITAL_COMMUNITY): Payer: Self-pay | Admitting: Oral Surgery

## 2023-01-01 ENCOUNTER — Ambulatory Visit (HOSPITAL_COMMUNITY): Payer: 59 | Admitting: Vascular Surgery

## 2023-01-01 DIAGNOSIS — F17200 Nicotine dependence, unspecified, uncomplicated: Secondary | ICD-10-CM | POA: Diagnosis not present

## 2023-01-01 DIAGNOSIS — K056 Periodontal disease, unspecified: Secondary | ICD-10-CM | POA: Diagnosis not present

## 2023-01-01 DIAGNOSIS — I10 Essential (primary) hypertension: Secondary | ICD-10-CM | POA: Diagnosis not present

## 2023-01-01 DIAGNOSIS — B1920 Unspecified viral hepatitis C without hepatic coma: Secondary | ICD-10-CM | POA: Insufficient documentation

## 2023-01-01 DIAGNOSIS — N189 Chronic kidney disease, unspecified: Secondary | ICD-10-CM

## 2023-01-01 DIAGNOSIS — J45909 Unspecified asthma, uncomplicated: Secondary | ICD-10-CM | POA: Diagnosis not present

## 2023-01-01 DIAGNOSIS — I129 Hypertensive chronic kidney disease with stage 1 through stage 4 chronic kidney disease, or unspecified chronic kidney disease: Secondary | ICD-10-CM | POA: Diagnosis not present

## 2023-01-01 DIAGNOSIS — K029 Dental caries, unspecified: Secondary | ICD-10-CM | POA: Diagnosis not present

## 2023-01-01 DIAGNOSIS — M270 Developmental disorders of jaws: Secondary | ICD-10-CM | POA: Diagnosis not present

## 2023-01-01 DIAGNOSIS — M2607 Excessive tuberosity of jaw: Secondary | ICD-10-CM | POA: Diagnosis not present

## 2023-01-01 DIAGNOSIS — K219 Gastro-esophageal reflux disease without esophagitis: Secondary | ICD-10-CM | POA: Diagnosis not present

## 2023-01-01 HISTORY — PX: TOOTH EXTRACTION: SHX859

## 2023-01-01 HISTORY — DX: Dependence on other enabling machines and devices: Z99.89

## 2023-01-01 LAB — CBC
HCT: 39 % (ref 39.0–52.0)
Hemoglobin: 12.5 g/dL — ABNORMAL LOW (ref 13.0–17.0)
MCH: 28 pg (ref 26.0–34.0)
MCHC: 32.1 g/dL (ref 30.0–36.0)
MCV: 87.4 fL (ref 80.0–100.0)
Platelets: 242 10*3/uL (ref 150–400)
RBC: 4.46 MIL/uL (ref 4.22–5.81)
RDW: 13.8 % (ref 11.5–15.5)
WBC: 6.4 10*3/uL (ref 4.0–10.5)
nRBC: 0 % (ref 0.0–0.2)

## 2023-01-01 LAB — COMPREHENSIVE METABOLIC PANEL
ALT: 11 U/L (ref 0–44)
AST: 19 U/L (ref 15–41)
Albumin: 3.4 g/dL — ABNORMAL LOW (ref 3.5–5.0)
Alkaline Phosphatase: 132 U/L — ABNORMAL HIGH (ref 38–126)
Anion gap: 9 (ref 5–15)
BUN: 14 mg/dL (ref 6–20)
CO2: 22 mmol/L (ref 22–32)
Calcium: 8.5 mg/dL — ABNORMAL LOW (ref 8.9–10.3)
Chloride: 103 mmol/L (ref 98–111)
Creatinine, Ser: 0.88 mg/dL (ref 0.61–1.24)
GFR, Estimated: >60 mL/min (ref >60)
Glucose, Bld: 97 mg/dL (ref 70–99)
Potassium: 3.9 mmol/L (ref 3.5–5.1)
Sodium: 134 mmol/L — ABNORMAL LOW (ref 135–145)
Total Bilirubin: 0.3 mg/dL (ref 0.3–1.2)
Total Protein: 6.4 g/dL — ABNORMAL LOW (ref 6.5–8.1)

## 2023-01-01 SURGERY — DENTAL RESTORATION/EXTRACTIONS
Anesthesia: General | Site: Mouth

## 2023-01-01 MED ORDER — ROCURONIUM BROMIDE 10 MG/ML (PF) SYRINGE
PREFILLED_SYRINGE | INTRAVENOUS | Status: DC | PRN
  Administered 2023-01-01: 40 mg via INTRAVENOUS

## 2023-01-01 MED ORDER — DEXAMETHASONE SODIUM PHOSPHATE 10 MG/ML IJ SOLN
INTRAMUSCULAR | Status: DC | PRN
  Administered 2023-01-01: 5 mg via INTRAVENOUS

## 2023-01-01 MED ORDER — LIDOCAINE 2% (20 MG/ML) 5 ML SYRINGE
INTRAMUSCULAR | Status: AC
  Filled 2023-01-01: qty 5

## 2023-01-01 MED ORDER — LABETALOL HCL 5 MG/ML IV SOLN
5.0000 mg | Freq: Once | INTRAVENOUS | Status: AC
  Administered 2023-01-01: 5 mg via INTRAVENOUS

## 2023-01-01 MED ORDER — CHLORHEXIDINE GLUCONATE 0.12 % MT SOLN: 15.0000 mL | Freq: Once | OROMUCOSAL | Status: AC

## 2023-01-01 MED ORDER — LIDOCAINE-EPINEPHRINE 2 %-1:100000 IJ SOLN
INTRAMUSCULAR | Status: DC | PRN
  Administered 2023-01-01: 20 mL via INTRADERMAL

## 2023-01-01 MED ORDER — METOPROLOL SUCCINATE ER 25 MG PO TB24
ORAL_TABLET | ORAL | Status: AC
  Administered 2023-01-01: 25 mg via ORAL
  Filled 2023-01-01: qty 1

## 2023-01-01 MED ORDER — PROPOFOL 10 MG/ML IV BOLUS
INTRAVENOUS | Status: AC
  Filled 2023-01-01: qty 20

## 2023-01-01 MED ORDER — LACTATED RINGERS IV SOLN: INTRAVENOUS | Status: DC

## 2023-01-01 MED ORDER — DEXAMETHASONE SODIUM PHOSPHATE 10 MG/ML IJ SOLN
INTRAMUSCULAR | Status: AC
  Filled 2023-01-01: qty 1

## 2023-01-01 MED ORDER — ORAL CARE MOUTH RINSE
15.0000 mL | Freq: Once | OROMUCOSAL | Status: AC
  Administered 2023-01-01: 15 mL via OROMUCOSAL

## 2023-01-01 MED ORDER — PROPOFOL 10 MG/ML IV BOLUS
INTRAVENOUS | Status: DC | PRN
  Administered 2023-01-01: 200 mg via INTRAVENOUS

## 2023-01-01 MED ORDER — OXYCODONE HCL 5 MG/5ML PO SOLN: 5.0000 mg | Freq: Once | ORAL | Status: DC | PRN

## 2023-01-01 MED ORDER — SODIUM CHLORIDE 0.9 % IR SOLN
Status: DC | PRN
  Administered 2023-01-01: 1000 mL

## 2023-01-01 MED ORDER — METOPROLOL SUCCINATE ER 25 MG PO TB24: 25.0000 mg | ORAL_TABLET | Freq: Once | ORAL | Status: AC

## 2023-01-01 MED ORDER — ONDANSETRON HCL 4 MG/2ML IJ SOLN
INTRAMUSCULAR | Status: DC | PRN
  Administered 2023-01-01: 4 mg via INTRAVENOUS

## 2023-01-01 MED ORDER — SUGAMMADEX SODIUM 200 MG/2ML IV SOLN
INTRAVENOUS | Status: DC | PRN
  Administered 2023-01-01: 200 mg via INTRAVENOUS

## 2023-01-01 MED ORDER — FENTANYL CITRATE (PF) 250 MCG/5ML IJ SOLN
INTRAMUSCULAR | Status: AC
  Filled 2023-01-01: qty 5

## 2023-01-01 MED ORDER — 0.9 % SODIUM CHLORIDE (POUR BTL) OPTIME
TOPICAL | Status: DC | PRN
  Administered 2023-01-01: 1000 mL

## 2023-01-01 MED ORDER — FENTANYL CITRATE (PF) 100 MCG/2ML IJ SOLN: 25.0000 ug | INTRAMUSCULAR | Status: DC | PRN

## 2023-01-01 MED ORDER — LIDOCAINE 2% (20 MG/ML) 5 ML SYRINGE
INTRAMUSCULAR | Status: DC | PRN
  Administered 2023-01-01: 60 mg via INTRAVENOUS

## 2023-01-01 MED ORDER — FENTANYL CITRATE (PF) 250 MCG/5ML IJ SOLN
INTRAMUSCULAR | Status: DC | PRN
  Administered 2023-01-01: 25 ug via INTRAVENOUS
  Administered 2023-01-01: 100 ug via INTRAVENOUS
  Administered 2023-01-01: 25 ug via INTRAVENOUS

## 2023-01-01 MED ORDER — AMOXICILLIN 500 MG PO CAPS
500.0000 mg | ORAL_CAPSULE | Freq: Three times a day (TID) | ORAL | 0 refills | Status: DC
  Filled 2023-01-01: qty 21, 7d supply, fill #0

## 2023-01-01 MED ORDER — LIDOCAINE-EPINEPHRINE 2 %-1:100000 IJ SOLN
INTRAMUSCULAR | Status: AC
  Filled 2023-01-01: qty 1

## 2023-01-01 MED ORDER — LABETALOL HCL 5 MG/ML IV SOLN
INTRAVENOUS | Status: AC
  Filled 2023-01-01: qty 4

## 2023-01-01 MED ORDER — OXYCODONE HCL 5 MG PO TABS: 5.0000 mg | ORAL_TABLET | Freq: Once | ORAL | Status: DC | PRN

## 2023-01-01 MED ORDER — OXYCODONE-ACETAMINOPHEN 5-325 MG PO TABS
1.0000 | ORAL_TABLET | ORAL | 0 refills | Status: AC | PRN
  Filled 2023-01-01: qty 30, 5d supply, fill #0

## 2023-01-01 MED ORDER — ROCURONIUM BROMIDE 10 MG/ML (PF) SYRINGE
PREFILLED_SYRINGE | INTRAVENOUS | Status: AC
  Filled 2023-01-01: qty 10

## 2023-01-01 MED ORDER — ONDANSETRON HCL 4 MG/2ML IJ SOLN
INTRAMUSCULAR | Status: AC
  Filled 2023-01-01: qty 2

## 2023-01-01 MED ORDER — OXYMETAZOLINE HCL 0.05 % NA SOLN
NASAL | Status: DC | PRN
Start: 2023-01-01 — End: 2023-01-01
  Administered 2023-01-01 (×2): 2 via NASAL

## 2023-01-01 MED ORDER — CEFAZOLIN SODIUM-DEXTROSE 2-4 GM/100ML-% IV SOLN
2.0000 g | INTRAVENOUS | Status: AC
Start: 2023-01-01 — End: 2023-01-01
  Administered 2023-01-01: 2 g via INTRAVENOUS
  Filled 2023-01-01: qty 100

## 2023-01-01 MED ORDER — PHENYLEPHRINE 80 MCG/ML (10ML) SYRINGE FOR IV PUSH (FOR BLOOD PRESSURE SUPPORT)
PREFILLED_SYRINGE | INTRAVENOUS | Status: DC | PRN
Start: 2023-01-01 — End: 2023-01-01
  Administered 2023-01-01: 160 ug via INTRAVENOUS
  Administered 2023-01-01: 80 ug via INTRAVENOUS
  Administered 2023-01-01 (×3): 160 ug via INTRAVENOUS

## 2023-01-01 MED ORDER — AMISULPRIDE (ANTIEMETIC) 5 MG/2ML IV SOLN: 10.0000 mg | Freq: Once | INTRAVENOUS | Status: DC | PRN

## 2023-01-01 MED ORDER — MIDAZOLAM HCL 2 MG/2ML IJ SOLN
INTRAMUSCULAR | Status: AC
  Filled 2023-01-01: qty 2

## 2023-01-01 MED ORDER — MIDAZOLAM HCL 2 MG/2ML IJ SOLN
INTRAMUSCULAR | Status: DC | PRN
  Administered 2023-01-01: 2 mg via INTRAVENOUS

## 2023-01-01 SURGICAL SUPPLY — 36 items
BAG COUNTER SPONGE SURGICOUNT (BAG) IMPLANT
BAG SPNG CNTER NS LX DISP (BAG)
BLADE SURG 15 STRL LF DISP TIS (BLADE) ×1 IMPLANT
BLADE SURG 15 STRL SS (BLADE) ×1
BUR CROSS CUT FISSURE 1.6 (BURR) ×1 IMPLANT
BUR EGG ELITE 4.0 (BURR) ×1 IMPLANT
CANISTER SUCT 3000ML PPV (MISCELLANEOUS) ×1 IMPLANT
COVER SURGICAL LIGHT HANDLE (MISCELLANEOUS) ×1 IMPLANT
GAUZE PACKING FOLDED 2 STR (GAUZE/BANDAGES/DRESSINGS) ×1 IMPLANT
GLOVE BIO SURGEON STRL SZ 6.5 (GLOVE) IMPLANT
GLOVE BIO SURGEON STRL SZ7 (GLOVE) IMPLANT
GLOVE BIO SURGEON STRL SZ8 (GLOVE) ×1 IMPLANT
GLOVE BIOGEL PI IND STRL 6.5 (GLOVE) IMPLANT
GLOVE BIOGEL PI IND STRL 7.0 (GLOVE) IMPLANT
GOWN STRL REUS W/ TWL LRG LVL3 (GOWN DISPOSABLE) ×1 IMPLANT
GOWN STRL REUS W/ TWL XL LVL3 (GOWN DISPOSABLE) ×1 IMPLANT
GOWN STRL REUS W/TWL LRG LVL3 (GOWN DISPOSABLE) ×1
GOWN STRL REUS W/TWL XL LVL3 (GOWN DISPOSABLE) ×1
IV NS 1000ML (IV SOLUTION) ×1
IV NS 1000ML BAXH (IV SOLUTION) ×1 IMPLANT
KIT BASIN OR (CUSTOM PROCEDURE TRAY) ×1 IMPLANT
KIT TURNOVER KIT B (KITS) ×1 IMPLANT
NDL HYPO 25GX1X1/2 BEV (NEEDLE) ×2 IMPLANT
NEEDLE HYPO 25GX1X1/2 BEV (NEEDLE) ×2 IMPLANT
NS IRRIG 1000ML POUR BTL (IV SOLUTION) ×1 IMPLANT
PAD ARMBOARD 7.5X6 YLW CONV (MISCELLANEOUS) ×1 IMPLANT
SLEEVE IRRIGATION ELITE 7 (MISCELLANEOUS) ×1 IMPLANT
SPIKE FLUID TRANSFER (MISCELLANEOUS) ×1 IMPLANT
SPONGE SURGIFOAM ABS GEL 12-7 (HEMOSTASIS) IMPLANT
SUT CHROMIC 3 0 PS 2 (SUTURE) ×1 IMPLANT
SUT CHROMIC 3 0 SH 27 (SUTURE) IMPLANT
SYR BULB IRRIG 60ML STRL (SYRINGE) ×1 IMPLANT
SYR CONTROL 10ML LL (SYRINGE) ×1 IMPLANT
TRAY ENT MC OR (CUSTOM PROCEDURE TRAY) ×1 IMPLANT
TUBING IRRIGATION (MISCELLANEOUS) ×1 IMPLANT
YANKAUER SUCT BULB TIP NO VENT (SUCTIONS) ×1 IMPLANT

## 2023-01-01 NOTE — Anesthesia Postprocedure Evaluation (Signed)
Anesthesia Post Note  Patient: [name]  Procedure(s) Performed: EXTRACTION TEETH NUMBER EIGHTEEN,NINETEEN,TWENTY,TWENTY-TWO,TWENTY-SEVEN,TWENTY-EIGHT,TWENTY-NINE,THIRTY-ONE, ALVEOLOPLASTY, REDUCTION HYPERPLASTIC LEFT MAXILLARY OSSEOUS TUBEROSITY, REMOVAL BILATERAL MANDIBULAR LINGUAL TORI (Mouth)     Patient location during evaluation: PACU Anesthesia Type: General Level of consciousness: awake Pain management: pain level controlled Vital Signs Assessment: post-procedure vital signs reviewed and stable Respiratory status: spontaneous breathing, nonlabored ventilation and respiratory function stable Cardiovascular status: blood pressure returned to baseline and stable Postop Assessment: no apparent nausea or vomiting Anesthetic complications: no   No notable events documented.  Last Vitals:  Vitals:   01/01/23 1245 01/01/23 1257  BP: (!) 153/101 (!) 156/97  Pulse: 89 97  Resp: 13 20  Temp:  36.6 C  SpO2: 99% 97%    Last Pain:  Vitals:   01/01/23 1257  TempSrc:   PainSc: 0-No pain                 [name]

## 2023-01-01 NOTE — H&P (Signed)
Anesthesia H&P Update: History and Physical Exam reviewed; patient is OK for planned anesthetic and procedure. ? ?

## 2023-01-01 NOTE — Anesthesia Procedure Notes (Signed)
Procedure Name: Intubation Date/Time: 01/01/2023 11:15 AM  Performed by: Shary Decamp, CRNAPre-anesthesia Checklist: Patient identified, Emergency Drugs available, Suction available and Patient being monitored Patient Re-evaluated:Patient Re-evaluated prior to induction Oxygen Delivery Method: Circle system utilized Preoxygenation: Pre-oxygenation with 100% oxygen Induction Type: IV induction Ventilation: Mask ventilation without difficulty Laryngoscope Size: Mac and 4 Grade View: Grade I Nasal Tubes: Nasal prep performed, Nasal Rae, Right and Magill forceps - small, utilized Placement Confirmation: ETT inserted through vocal cords under direct vision, positive ETCO2 and breath sounds checked- equal and bilateral Tube secured with: Tape Dental Injury: Teeth and Oropharynx as per pre-operative assessment

## 2023-01-01 NOTE — Transfer of Care (Signed)
Immediate Anesthesia Transfer of Care Note  Patient: [name]  Procedure(s) Performed: EXTRACTION TEETH NUMBER EIGHTEEN,NINETEEN,TWENTY,TWENTY-TWO,TWENTY-SEVEN,TWENTY-EIGHT,TWENTY-NINE,THIRTY-ONE, ALVEOLOPLASTY, REDUCTION HYPERPLASTIC LEFT MAXILLARY OSSEOUS TUBEROSITY, REMOVAL BILATERAL MANDIBULAR LINGUAL TORI (Mouth)  Patient Location: PACU  Anesthesia Type:General  Level of Consciousness: awake, alert , oriented, patient cooperative, and responds to stimulation  Airway & Oxygen Therapy: Patient Spontanous Breathing and Patient connected to face mask oxygen  Post-op Assessment: Report given to RN, Post -op Vital signs reviewed and stable, and Patient moving all extremities X 4  Post vital signs: Reviewed and stable  Last Vitals:  Vitals Value Taken Time  BP 150/99 01/01/23 1209  Temp    Pulse 93 01/01/23 1209  Resp    SpO2 100 % 01/01/23 1209  Vitals shown include unfiled device data.  Last Pain:  Vitals:   01/01/23 0904  TempSrc:   PainSc: 0-No pain         Complications: No notable events documented.

## 2023-01-01 NOTE — Op Note (Signed)
01/01/2023  11:56 AM  PATIENT:  [name]  57 y.o. male  PRE-OPERATIVE DIAGNOSIS:  NON RESTORABLE TEETH NUMBERS EIGHTEEN,NINETEEN,TWENTY,TWENTY-TWO,TWENTY-SEVEN,TWENTY-EIGHT,TWENTY-NINE,THIRTY-ONE, HYPERPLASTIC LEFT MAXILLARY OSSEOUS TUBEROSITY, BILATERAL MANDIBULAR LINGUAL TORI  POST-OPERATIVE DIAGNOSIS:  SAME  PROCEDURE:  Procedure(s): EXTRACTION TEETH NUMBER EIGHTEEN,NINETEEN,TWENTY,TWENTY-TWO,TWENTY-SEVEN,TWENTY-EIGHT,TWENTY-NINE,THIRTY-ONE, ALVEOLOPLASTY, REDUCTION HYPERPLASTIC LEFT MAXILLARY OSSEOUS TUBEROSITY, REMOVAL BILATERAL MANDIBULAR LINGUAL TORI  SURGEON:  Surgeon(s): Ocie Doyne, DMD  ANESTHESIA:   local and general  EBL:  minimal  DRAINS: none   SPECIMEN:  No Specimen  COUNTS:  YES  PLAN OF CARE: Discharge to home after PACU  PATIENT DISPOSITION:  PACU - hemodynamically stable.   PROCEDURE DETAILS: Dictation # [id number]  Georgia Lopes, DMD 01/01/2023 11:56 AM

## 2023-01-01 NOTE — Op Note (Signed)
NAME: [name], [name] MEDICAL RECORD NO: [id number] ACCOUNT NO: [id number] DATE OF BIRTH: 01/25/1966 FACILITY: [facility] LOCATION: [facility] PHYSICIAN: Georgia Lopes, DDS  Operative Report   DATE OF PROCEDURE: 01/01/2023  PREOPERATIVE DIAGNOSIS:  Nonrestorable teeth secondary to dental caries and periodontal disease number 18, 19, 20, 22, 27, 28, 29, 31, hyperplastic maxillary left tuberosity bilateral mandibular lingual tori.  POSTOPERATIVE DIAGNOSIS:  Nonrestorable teeth secondary to dental caries and periodontal disease number 18, 19, 20, 22, 27, 28, 29, 31, hyperplastic maxillary left tuberosity bilateral mandibular lingual tori.  PROCEDURE:  Extraction teeth numbers 18, 19, 20, 22, 27, 28, 29, 31.  Alveoloplasty, right and left mandible.  Reduction hyperplastic left maxillary osseous tuberosity.  Removal of bilateral mandibular lingual tori.  SURGEON:  Georgia Lopes, DDS  ANESTHESIA:  General, nasal intubation.  ANESTHESIA ATTENDING:  Dr. Bradley Ferris.  DESCRIPTION OF PROCEDURE:  The patient was taken to the operating room and placed on the table in supine position.  General anesthesia was administered and nasal endotracheal tube was placed and secured.  The eyes were protected and the patient was  draped for surgery.  Timeout was performed.  The posterior pharynx was suctioned and a throat pack was placed.  2% lidocaine 1:100,000 epinephrine was infiltrated in an inferior alveolar block on the right and left sides and in buccal and palatal  infiltration in the left maxilla.  A bite block was placed on the right side of the mouth, the left side was operated first.  The left mandible was operated using a 15 blade to make an incision around tooth #18 buccally and lingually in the gingival  sulcus and carried forward to tooth #20 and then was continued forward to tooth #22 and then both buccally and lingually in the gingival sulcus and then carried forward towards the midline.  The periosteum  was reflected.  The Stryker handpiece with a  fissure bur under irrigation was used to remove bone around teeth numbers 18, 19, 20 and 22, so that they could be elevated.  The teeth were then elevated and removed from the mouth.  The sockets were curetted.  The periosteum was trimmed and the tissue  was trimmed to allow for primary closure and then it was reflected to expose the alveolar crest and the lingual torus.  Alveoplasty was performed using the egg bur followed by the bone file to smooth the left mandibular alveolus and then the egg bur was  used to reduce the lingual torus retracting the lingual tissues with the Seldin retractor then the bone file was used to further smooth the lingual mandible and the area was irrigated and closed with 3-0 chromic.  Then, the left maxilla was operated.   The 15 blade was used to make an incision along the left tuberosity carried forward to approximate area of tooth #14 on the midline of the alveolus.  The periosteum was reflected exposing the bone and then the Stryker handpiece with the egg bur was used  to reduce the tuberosity and the bone file was used to further smooth the area.  Then, this incision was closed after irrigating with 3-0 chromic.  Attention was then turned to the right mandible, the 15 blade was used to make an incision around teeth  numbers 27, 28, 29 and 31.  The periosteum was reflected.  Teeth were elevated and removed from the mouth with the dental forceps.  Then, the sockets were curetted.  The tissue was trimmed to allow for primary closure  and then reflected to expose the  alveolus, which was irregular and had undercuts so alveoplasty was performed using the egg bur followed by the bone file and then the tissue was reflected lingually to expose the lingual torus and this was reduced using the Stryker handpiece with the egg  bur under irrigation.  Then, it was further smoothed with a bone file.  Then, the right mandible was irrigated and  closed with 3-0 chromic.  The oral cavity was then irrigated and suctioned.  The throat pack was removed.  The patient was left under care  of anesthesia for extubation and transport to recovery room with plans for discharge home through day surgery.  ESTIMATED BLOOD LOSS:  Minimal.  COMPLICATIONS:  None.  SPECIMENS:  None.  COUNTS:  Correct.   PUS D: 01/01/2023 12:01:27 pm T: 01/01/2023 12:16:00 pm  JOB: [id number]/ [id number]

## 2023-01-01 NOTE — H&P (Signed)
H&P documentation  -History and Physical Reviewed  -Patient has been re-examined  -No change in the plan of care  [name]  

## 2023-01-02 ENCOUNTER — Encounter (HOSPITAL_COMMUNITY): Payer: Self-pay | Admitting: Oral Surgery

## 2023-01-30 ENCOUNTER — Emergency Department (HOSPITAL_COMMUNITY): Payer: 59

## 2023-01-30 ENCOUNTER — Inpatient Hospital Stay (HOSPITAL_COMMUNITY): Payer: 59

## 2023-01-30 ENCOUNTER — Other Ambulatory Visit: Payer: Self-pay

## 2023-01-30 ENCOUNTER — Inpatient Hospital Stay (HOSPITAL_COMMUNITY)
Admission: EM | Admit: 2023-01-30 | Discharge: 2023-02-01 | DRG: 641 | Disposition: A | Discharge status: Home / Self Care | Payer: 59 | Attending: Family Medicine | Admitting: Family Medicine

## 2023-01-30 ENCOUNTER — Encounter (HOSPITAL_COMMUNITY): Payer: Self-pay | Admitting: Emergency Medicine

## 2023-01-30 DIAGNOSIS — F17210 Nicotine dependence, cigarettes, uncomplicated: Secondary | ICD-10-CM | POA: Diagnosis present

## 2023-01-30 DIAGNOSIS — R6510 Systemic inflammatory response syndrome (SIRS) of non-infectious origin without acute organ dysfunction: Principal | ICD-10-CM | POA: Diagnosis present

## 2023-01-30 DIAGNOSIS — R7989 Other specified abnormal findings of blood chemistry: Secondary | ICD-10-CM | POA: Diagnosis present

## 2023-01-30 DIAGNOSIS — F209 Schizophrenia, unspecified: Secondary | ICD-10-CM | POA: Diagnosis present

## 2023-01-30 DIAGNOSIS — E872 Acidosis, unspecified: Secondary | ICD-10-CM | POA: Diagnosis present

## 2023-01-30 DIAGNOSIS — Z888 Allergy status to other drugs, medicaments and biological substances status: Secondary | ICD-10-CM | POA: Diagnosis not present

## 2023-01-30 DIAGNOSIS — I509 Heart failure, unspecified: Secondary | ICD-10-CM

## 2023-01-30 DIAGNOSIS — Z79899 Other long term (current) drug therapy: Secondary | ICD-10-CM | POA: Diagnosis not present

## 2023-01-30 DIAGNOSIS — F200 Paranoid schizophrenia: Secondary | ICD-10-CM | POA: Diagnosis present

## 2023-01-30 DIAGNOSIS — E119 Type 2 diabetes mellitus without complications: Secondary | ICD-10-CM | POA: Diagnosis present

## 2023-01-30 DIAGNOSIS — K219 Gastro-esophageal reflux disease without esophagitis: Secondary | ICD-10-CM | POA: Diagnosis present

## 2023-01-30 DIAGNOSIS — E876 Hypokalemia: Secondary | ICD-10-CM | POA: Diagnosis present

## 2023-01-30 DIAGNOSIS — Z1152 Encounter for screening for COVID-19: Secondary | ICD-10-CM | POA: Diagnosis not present

## 2023-01-30 DIAGNOSIS — F1110 Opioid abuse, uncomplicated: Secondary | ICD-10-CM | POA: Diagnosis present

## 2023-01-30 DIAGNOSIS — F1990 Other psychoactive substance use, unspecified, uncomplicated: Secondary | ICD-10-CM

## 2023-01-30 DIAGNOSIS — E871 Hypo-osmolality and hyponatremia: Secondary | ICD-10-CM | POA: Diagnosis present

## 2023-01-30 DIAGNOSIS — Z96651 Presence of right artificial knee joint: Secondary | ICD-10-CM | POA: Diagnosis present

## 2023-01-30 DIAGNOSIS — E860 Dehydration: Principal | ICD-10-CM | POA: Diagnosis present

## 2023-01-30 DIAGNOSIS — Z8673 Personal history of transient ischemic attack (TIA), and cerebral infarction without residual deficits: Secondary | ICD-10-CM

## 2023-01-30 DIAGNOSIS — J4489 Other specified chronic obstructive pulmonary disease: Secondary | ICD-10-CM | POA: Diagnosis present

## 2023-01-30 DIAGNOSIS — I10 Essential (primary) hypertension: Secondary | ICD-10-CM | POA: Diagnosis present

## 2023-01-30 DIAGNOSIS — Z881 Allergy status to other antibiotic agents status: Secondary | ICD-10-CM

## 2023-01-30 DIAGNOSIS — K0889 Other specified disorders of teeth and supporting structures: Secondary | ICD-10-CM

## 2023-01-30 DIAGNOSIS — R509 Fever, unspecified: Secondary | ICD-10-CM | POA: Diagnosis not present

## 2023-01-30 DIAGNOSIS — J449 Chronic obstructive pulmonary disease, unspecified: Secondary | ICD-10-CM | POA: Diagnosis present

## 2023-01-30 LAB — URINALYSIS, ROUTINE W REFLEX MICROSCOPIC
Bilirubin Urine: NEGATIVE
Glucose, UA: NEGATIVE mg/dL
Hgb urine dipstick: NEGATIVE
Ketones, ur: NEGATIVE mg/dL
Leukocytes,Ua: NEGATIVE
Nitrite: NEGATIVE
Protein, ur: NEGATIVE mg/dL
Specific Gravity, Urine: 1.004 — ABNORMAL LOW (ref 1.005–1.030)
pH: 7 (ref 5.0–8.0)

## 2023-01-30 LAB — BASIC METABOLIC PANEL
Anion gap: 10 (ref 5–15)
BUN: 11 mg/dL (ref 6–20)
CO2: 22 mmol/L (ref 22–32)
Calcium: 8.6 mg/dL — ABNORMAL LOW (ref 8.9–10.3)
Chloride: 102 mmol/L (ref 98–111)
Creatinine, Ser: 0.72 mg/dL (ref 0.61–1.24)
GFR, Estimated: >60 mL/min (ref >60)
Glucose, Bld: 105 mg/dL — ABNORMAL HIGH (ref 70–99)
Potassium: 3.3 mmol/L — ABNORMAL LOW (ref 3.5–5.1)
Sodium: 134 mmol/L — ABNORMAL LOW (ref 135–145)

## 2023-01-30 LAB — DIFFERENTIAL
Abs Immature Granulocytes: 0.17 10*3/uL — ABNORMAL HIGH (ref 0.00–0.07)
Basophils Absolute: 0.1 10*3/uL (ref 0.0–0.1)
Basophils Relative: 0 %
Eosinophils Absolute: 0 10*3/uL (ref 0.0–0.5)
Eosinophils Relative: 0 %
Immature Granulocytes: 1 %
Lymphocytes Relative: 2 %
Lymphs Abs: 0.6 10*3/uL — ABNORMAL LOW (ref 0.7–4.0)
Monocytes Absolute: 0.4 10*3/uL (ref 0.1–1.0)
Monocytes Relative: 2 %
Neutro Abs: 23.5 10*3/uL — ABNORMAL HIGH (ref 1.7–7.7)
Neutrophils Relative %: 95 %

## 2023-01-30 LAB — HEPATIC FUNCTION PANEL
ALT: 22 U/L (ref 0–44)
AST: 54 U/L — ABNORMAL HIGH (ref 15–41)
Albumin: 4.2 g/dL (ref 3.5–5.0)
Alkaline Phosphatase: 165 U/L — ABNORMAL HIGH (ref 38–126)
Bilirubin, Direct: 0.1 mg/dL (ref 0.0–0.2)
Indirect Bilirubin: 0.7 mg/dL (ref 0.3–0.9)
Total Bilirubin: 0.8 mg/dL (ref 0.3–1.2)
Total Protein: 7.7 g/dL (ref 6.5–8.1)

## 2023-01-30 LAB — CBC
HCT: 41.5 % (ref 39.0–52.0)
Hemoglobin: 13.6 g/dL (ref 13.0–17.0)
MCH: 28.8 pg (ref 26.0–34.0)
MCHC: 32.8 g/dL (ref 30.0–36.0)
MCV: 87.7 fL (ref 80.0–100.0)
Platelets: 260 10*3/uL (ref 150–400)
RBC: 4.73 MIL/uL (ref 4.22–5.81)
RDW: 14.3 % (ref 11.5–15.5)
WBC: 21.2 10*3/uL — ABNORMAL HIGH (ref 4.0–10.5)
nRBC: 0 % (ref 0.0–0.2)

## 2023-01-30 LAB — MAGNESIUM: Magnesium: 1.9 mg/dL (ref 1.7–2.4)

## 2023-01-30 LAB — PHOSPHORUS: Phosphorus: 3.4 mg/dL (ref 2.5–4.6)

## 2023-01-30 LAB — SARS CORONAVIRUS 2 BY RT PCR: SARS Coronavirus 2 by RT PCR: NEGATIVE

## 2023-01-30 MED ORDER — ALBUTEROL SULFATE HFA 108 (90 BASE) MCG/ACT IN AERS: 2.0000 | INHALATION_SPRAY | Freq: Two times a day (BID) | RESPIRATORY_TRACT | Status: DC | PRN

## 2023-01-30 MED ORDER — ONDANSETRON HCL 4 MG/2ML IJ SOLN
4.0000 mg | Freq: Once | INTRAMUSCULAR | Status: AC
  Administered 2023-01-30: 4 mg via INTRAVENOUS
  Filled 2023-01-30: qty 2

## 2023-01-30 MED ORDER — SODIUM CHLORIDE 0.9 % IV BOLUS
1000.0000 mL | Freq: Once | INTRAVENOUS | Status: AC
  Administered 2023-01-30: 1000 mL via INTRAVENOUS

## 2023-01-30 MED ORDER — ACETAMINOPHEN 325 MG PO TABS
650.0000 mg | ORAL_TABLET | Freq: Four times a day (QID) | ORAL | Status: DC | PRN
  Administered 2023-01-31: 650 mg via ORAL
  Filled 2023-01-30: qty 2

## 2023-01-30 MED ORDER — PIPERACILLIN-TAZOBACTAM 3.375 G IVPB 30 MIN
3.3750 g | Freq: Once | INTRAVENOUS | Status: AC
  Administered 2023-01-30: 3.375 g via INTRAVENOUS
  Filled 2023-01-30: qty 50

## 2023-01-30 MED ORDER — ALBUTEROL SULFATE (2.5 MG/3ML) 0.083% IN NEBU: 2.5000 mg | INHALATION_SOLUTION | Freq: Two times a day (BID) | RESPIRATORY_TRACT | Status: DC | PRN

## 2023-01-30 MED ORDER — POTASSIUM CHLORIDE CRYS ER 20 MEQ PO TBCR
40.0000 meq | EXTENDED_RELEASE_TABLET | Freq: Once | ORAL | Status: AC
  Administered 2023-01-30: 40 meq via ORAL
  Filled 2023-01-30: qty 2

## 2023-01-30 MED ORDER — ACETAMINOPHEN 650 MG RE SUPP: 650.0000 mg | Freq: Four times a day (QID) | RECTAL | Status: DC | PRN

## 2023-01-30 MED ORDER — ONDANSETRON HCL 4 MG/2ML IJ SOLN
4.0000 mg | Freq: Four times a day (QID) | INTRAMUSCULAR | Status: DC | PRN
  Administered 2023-01-30 – 2023-02-01 (×5): 4 mg via INTRAVENOUS
  Filled 2023-01-30 (×6): qty 2

## 2023-01-30 MED ORDER — MORPHINE SULFATE (PF) 4 MG/ML IV SOLN
4.0000 mg | Freq: Once | INTRAVENOUS | Status: AC
  Administered 2023-01-30: 4 mg via INTRAVENOUS
  Filled 2023-01-30: qty 1

## 2023-01-30 MED ORDER — IOHEXOL 300 MG/ML  SOLN
75.0000 mL | Freq: Once | INTRAMUSCULAR | Status: AC | PRN
  Administered 2023-01-30: 75 mL via INTRAVENOUS

## 2023-01-30 MED ORDER — SODIUM CHLORIDE 0.9 % IV SOLN
2.0000 g | Freq: Three times a day (TID) | INTRAVENOUS | Status: DC
  Administered 2023-01-31 – 2023-02-01 (×5): 2 g via INTRAVENOUS
  Filled 2023-01-30 (×5): qty 12.5

## 2023-01-30 MED ORDER — METRONIDAZOLE 500 MG/100ML IV SOLN
500.0000 mg | Freq: Two times a day (BID) | INTRAVENOUS | Status: DC
  Administered 2023-01-31 – 2023-02-01 (×3): 500 mg via INTRAVENOUS
  Filled 2023-01-30 (×3): qty 100

## 2023-01-30 MED ORDER — VANCOMYCIN HCL 1750 MG/350ML IV SOLN
1750.0000 mg | Freq: Once | INTRAVENOUS | Status: AC
  Administered 2023-01-30: 1750 mg via INTRAVENOUS
  Filled 2023-01-30: qty 350

## 2023-01-30 MED ORDER — VANCOMYCIN HCL 1250 MG/250ML IV SOLN
1250.0000 mg | Freq: Two times a day (BID) | INTRAVENOUS | Status: DC
  Administered 2023-01-31 – 2023-02-01 (×3): 1250 mg via INTRAVENOUS
  Filled 2023-01-30 (×4): qty 250

## 2023-01-30 MED ORDER — METOPROLOL SUCCINATE ER 25 MG PO TB24
25.0000 mg | ORAL_TABLET | Freq: Every day | ORAL | Status: DC
  Administered 2023-01-30 – 2023-02-01 (×3): 25 mg via ORAL
  Filled 2023-01-30 (×3): qty 1

## 2023-01-30 MED ORDER — KETOROLAC TROMETHAMINE 30 MG/ML IJ SOLN
30.0000 mg | Freq: Four times a day (QID) | INTRAMUSCULAR | Status: DC | PRN
  Administered 2023-01-30 – 2023-02-01 (×2): 30 mg via INTRAVENOUS
  Filled 2023-01-30 (×2): qty 1

## 2023-01-30 MED ORDER — OXYCODONE HCL 5 MG PO TABS
5.0000 mg | ORAL_TABLET | ORAL | Status: DC | PRN
  Administered 2023-01-30 – 2023-02-01 (×7): 5 mg via ORAL
  Filled 2023-01-30 (×7): qty 1

## 2023-01-30 MED ORDER — ENOXAPARIN SODIUM 40 MG/0.4ML IJ SOSY
40.0000 mg | PREFILLED_SYRINGE | INTRAMUSCULAR | Status: DC
  Administered 2023-01-30 – 2023-01-31 (×2): 40 mg via SUBCUTANEOUS
  Filled 2023-01-30 (×2): qty 0.4

## 2023-01-30 MED ORDER — SODIUM CHLORIDE 0.9 % IV SOLN: 2.0000 g | Freq: Once | INTRAVENOUS | Status: DC

## 2023-01-30 MED ORDER — FAMOTIDINE 20 MG PO TABS
20.0000 mg | ORAL_TABLET | Freq: Two times a day (BID) | ORAL | Status: DC
Start: 2023-01-30 — End: 2023-02-01
  Administered 2023-01-30 – 2023-02-01 (×4): 20 mg via ORAL
  Filled 2023-01-30 (×4): qty 1

## 2023-01-30 NOTE — ED Notes (Signed)
ED TO INPATIENT HANDOFF REPORT  Name/Age/Gender [name] 57 y.o. male  Code Status Code Status History     Date Active Date Inactive Code Status Order ID Comments User Context   11/03/2020 0210 11/18/2020 2135 Full Code [id number]  Eduard Clos, MD ED   01/07/2020 1026 02/20/2020 1953 Full Code [id number]  Pieter Partridge, MD ED   07/26/2019 0425 09/05/2019 2002 Full Code [id number]  Marinda Elk, MD ED   04/11/2019 1444 04/13/2019 1720 Full Code [id number]  Kerrin Champagne, MD Inpatient   11/21/2018 0914 11/21/2018 2210 Full Code [id number]  Wynetta Fines, MD ED   01/28/2018 2012 01/31/2018 1935 Full Code [id number]  Charlsie Quest, MD ED   10/20/2017 1055 10/21/2017 2228 Full Code [id number]  Nadara Mustard, MD Inpatient   07/21/2016 1655 07/24/2016 1546 Full Code [id number]  Cammy Copa, MD Inpatient   02/18/2015 1941 02/21/2015 1635 Full Code [id number]  Alison Murray, MD Inpatient   03/01/2014 2211 03/05/2014 1902 Full Code [id number]  Assunta Found, NP Inpatient   03/01/2014 2150 03/01/2014 2211 Full Code [id number]  Rankin, Shuvon, NP Inpatient   02/28/2014 2106 03/01/2014 2150 Full Code [id number]  Monte Fantasia, PA-C ED   12/04/2013 1221 12/07/2013 1505 Full Code [id number]  Alison Murray, MD ED   11/03/2013 1548 11/04/2013 1602 Full Code [id number]  Toy Cookey, MD ED   11/03/2013 1518 11/03/2013 1548 Full Code [id number]  Toy Cookey, MD ED       Home/SNF/Other Home  Chief Complaint SIRS (systemic inflammatory response syndrome) (HCC) [R65.10]  Level of Care/Admitting Diagnosis ED Disposition     ED Disposition  Admit   Condition  --   Comment  Hospital Area: [facility] [[id number]]  Level of Care: Med-Surg [16]  May admit patient to [facility] or [facility] if equivalent level of care is available:: No  Covid Evaluation: Asymptomatic - no recent exposure (last 10 days) testing not required  Diagnosis: SIRS (systemic inflammatory  response syndrome) [facility]) [[id number]]  Admitting Physician: Bobette Mo [[id number]]  Attending Physician: Bobette Mo [[id number]]  Certification:: I certify this patient will need inpatient services for at least 2 midnights  Expected Medical Readiness: 02/01/2023          Medical History Past Medical History:  Diagnosis Date   Ambulates with cane    straight cane   Anxiety    Arthritis    Asthma    Depression    Duodenal adenoma    GERD (gastroesophageal reflux disease)    Headache    Hepatitis    Hepatitis C -   Hypertension    Pneumonia    hx   Post-traumatic osteoarthritis of left ankle    Pre-diabetes    hgba1c on 09/2020 was 5.9   Presence of right artificial knee joint 12/17/2016   Schizophrenia (HCC) 07/2020   from [facility]   Stroke [facility])    2009-or10   Substance abuse (HCC) 07/2020   heroin abuse from [facility]    Allergies Allergies  Allergen Reactions   Levofloxacin Hives and Other (See Comments)    Confusion, hives, and itching (patient has tolerated ciprofloxacin)   Chlorhexidine Itching and Rash    IV Location/Drains/Wounds Patient Lines/Drains/Airways Status     Active Line/Drains/Airways     Name Placement date Placement time Site Days   Peripheral IV 01/30/23 20 G Anterior;Proximal;Right Forearm 01/30/23  1557  Forearm  less than 1  Wound / Incision (Open or Dehisced) 01/28/18 Other (Comment) Ankle Left 01/28/18  1206  Ankle  1828            Labs/Imaging Results for orders placed or performed during the hospital encounter of 01/30/23 (from the past 48 hour(s))  Urinalysis, Routine w reflex microscopic -Urine, Clean Catch     Status: Abnormal   Collection Time: 01/30/23 12:27 PM  Result Value Ref Range   Color, Urine STRAW (A) YELLOW   APPearance CLEAR CLEAR   Specific Gravity, Urine 1.004 (L) 1.005 - 1.030   pH 7.0 5.0 - 8.0   Glucose, UA NEGATIVE NEGATIVE mg/dL   Hgb urine dipstick NEGATIVE NEGATIVE   Bilirubin Urine NEGATIVE  NEGATIVE   Ketones, ur NEGATIVE NEGATIVE mg/dL   Protein, ur NEGATIVE NEGATIVE mg/dL   Nitrite NEGATIVE NEGATIVE   Leukocytes,Ua NEGATIVE NEGATIVE    Comment: Performed at [facility], [address], [place], [place] [address]  Basic metabolic panel     Status: Abnormal   Collection Time: 01/30/23 12:49 PM  Result Value Ref Range   Sodium 134 (L) 135 - 145 mmol/L   Potassium 3.3 (L) 3.5 - 5.1 mmol/L   Chloride 102 98 - 111 mmol/L   CO2 22 22 - 32 mmol/L   Glucose, Bld 105 (H) 70 - 99 mg/dL    Comment: Glucose reference range applies only to samples taken after fasting for at least 8 hours.   BUN 11 6 - 20 mg/dL   Creatinine, Ser 0.34 0.61 - 1.24 mg/dL   Calcium 8.6 (L) 8.9 - 10.3 mg/dL   GFR, Estimated >74 >25 mL/min    Comment: (NOTE) Calculated using the CKD-EPI Creatinine Equation (2021)    Anion gap 10 5 - 15    Comment: Performed at [facility], [address], [place], [place] [address]  CBC     Status: Abnormal   Collection Time: 01/30/23 12:49 PM  Result Value Ref Range   WBC 21.2 (H) 4.0 - 10.5 K/uL   RBC 4.73 4.22 - 5.81 MIL/uL   Hemoglobin 13.6 13.0 - 17.0 g/dL   HCT 75.6 43.3 - 29.5 %   MCV 87.7 80.0 - 100.0 fL   MCH 28.8 26.0 - 34.0 pg   MCHC 32.8 30.0 - 36.0 g/dL   RDW 18.8 41.6 - 60.6 %   Platelets 260 150 - 400 K/uL   nRBC 0.0 0.0 - 0.2 %    Comment: Performed at [facility], [address] [place], [place] [address]  SARS Coronavirus 2 by RT PCR (hospital order, performed in [facility] hospital lab) *cepheid single result test* Anterior Nasal Swab     Status: None   Collection Time: 01/30/23 12:50 PM   Specimen: Anterior Nasal Swab  Result Value Ref Range   SARS Coronavirus 2 by RT PCR NEGATIVE NEGATIVE    Comment: (NOTE) SARS-CoV-2 target nucleic acids are NOT DETECTED.  The SARS-CoV-2 RNA is generally detectable in upper and lower respiratory specimens during the acute phase of infection.  The lowest concentration of SARS-CoV-2 viral copies this assay can detect is 250 copies / mL. A negative result does not preclude SARS-CoV-2 infection and should not be used as the sole basis for treatment or other patient management decisions.  A negative result may occur with improper specimen collection / handling, submission of specimen other than nasopharyngeal swab, presence of viral mutation(s) within the areas targeted by this assay, and inadequate number  of viral copies (<250 copies / mL). A negative result must be combined with clinical observations, patient history, and epidemiological information.  Fact Sheet for Patients:   RoadLapTop.co.za  Fact Sheet for Healthcare Providers: http://kim-miller.com/  This test is not yet approved or  cleared by the [place] FDA and has been authorized for detection and/or diagnosis of SARS-CoV-2 by FDA under an Emergency Use Authorization (EUA).  This EUA will remain in effect (meaning this test can be used) for the duration of the COVID-19 declaration under Section 564(b)(1) of the Act, 21 U.S.C. section 360bbb-3(b)(1), unless the authorization is terminated or revoked sooner.  Performed at [facility], [address] [place], [place] [address]   Differential     Status: Abnormal   Collection Time: 01/30/23  4:06 PM  Result Value Ref Range   Neutrophils Relative % 95 %   Neutro Abs 23.5 (H) 1.7 - 7.7 K/uL   Lymphocytes Relative 2 %   Lymphs Abs 0.6 (L) 0.7 - 4.0 K/uL   Monocytes Relative 2 %   Monocytes Absolute 0.4 0.1 - 1.0 K/uL   Eosinophils Relative 0 %   Eosinophils Absolute 0.0 0.0 - 0.5 K/uL   Basophils Relative 0 %   Basophils Absolute 0.1 0.0 - 0.1 K/uL   Immature Granulocytes 1 %   Abs Immature Granulocytes 0.17 (H) 0.00 - 0.07 K/uL    Comment: Performed at [facility], [address], [place], [place] [address]  Hepatic  function panel     Status: Abnormal   Collection Time: 01/30/23  4:06 PM  Result Value Ref Range   Total Protein 7.7 6.5 - 8.1 g/dL   Albumin 4.2 3.5 - 5.0 g/dL   AST 54 (H) 15 - 41 U/L   ALT 22 0 - 44 U/L   Alkaline Phosphatase 165 (H) 38 - 126 U/L   Total Bilirubin 0.8 0.3 - 1.2 mg/dL   Bilirubin, Direct 0.1 0.0 - 0.2 mg/dL   Indirect Bilirubin 0.7 0.3 - 0.9 mg/dL    Comment: Performed at [facility], [address], [place], [place] [address]   DG Chest 2 View  Result Date: 01/30/2023 CLINICAL DATA:  Cough and fever.  Chills and fatigue. EXAM: CHEST - 2 VIEW COMPARISON:  01/06/2020 FINDINGS: The heart size and mediastinal contours are within normal limits. Both lungs are clear. Severe bilateral glenohumeral osteoarthritis noted. IMPRESSION: No active cardiopulmonary disease. Electronically Signed   By: Danae Orleans M.D.   On: 01/30/2023 16:23    Pending Labs Unresulted Labs (From admission, onward)     Start     Ordered   01/30/23 1519  Culture, blood (routine x 2)  BLOOD CULTURE X 2,   R (with STAT occurrences)      01/30/23 1518            Vitals/Pain Today's Vitals   01/30/23 1615 01/30/23 1644 01/30/23 1644 01/30/23 1731  BP: (!) 155/107     Pulse: 85 86    Resp: 18     Temp:  98.9 F (37.2 C)    TempSrc:  Oral    SpO2: 99% 100%    Weight:      Height:      PainSc:   8  5     Isolation Precautions Airborne and Contact precautions  Medications Medications  sodium chloride 0.9 % bolus 1,000 mL (0 mLs Intravenous Stopped 01/30/23 1731)  morphine (PF) 4 MG/ML injection 4 mg (  4 mg Intravenous Given 01/30/23 1557)  morphine (PF) 4 MG/ML injection 4 mg (4 mg Intravenous Given 01/30/23 1702)  ondansetron (ZOFRAN) injection 4 mg (4 mg Intravenous Given 01/30/23 1703)    Mobility walks with device

## 2023-01-30 NOTE — H&P (Signed)
History and Physical    Patient: [name] ZOX:[id number] DOB: 10-09-1965 DOA: 01/30/2023 DOS: the patient was seen and examined on 01/30/2023 PCP: Reece Leader, DO (Inactive)  Patient coming from: Home  Chief Complaint:  Chief Complaint  Patient presents with   Fatigue   HPI: [name] is a 57 y.o. male with medical history significant of anxiety, depression, osteoarthritis, asthma, other than melanoma, GERD, unspecified headache, hep C, hypertension, history of pneumonia, posttraumatic osteoarthritis of the left ankle, type 2 diabetes, history of unspecified stroke, schizophrenia on monthly Invega, alcohol abuse in remission, cocaine abuse in remission, active opioid abuse, history of epidural abscess who had extensive oral surgery the past month with significant oral cavity pain and has been using street fentanyl who presented to the emergency department who presented to the emergency department with complaints of fatigue, fever, chills and night sweats.  He also complains of pain in his right wrist area after he injected fentanyl about 2 weeks ago. He denied rhinorrhea, sore throat, wheezing or hemoptysis.  No chest pain, palpitations, diaphoresis, PND, orthopnea or pitting edema of the lower extremities.  No abdominal pain, nausea, emesis, diarrhea, constipation, melena or hematochezia.  No flank pain, dysuria, frequency or hematuria.  No polyuria, polydipsia, polyphagia or blurred vision.   Lab work: His urine analysis showed a low specific gravity but was otherwise unremarkable.  CBC showed white count of 21.2 with 95% neutrophils, hemoglobin 13.6 g/dL platelets 811.  Coronavirus PCR was negative.  Sodium 134, potassium 3.2, chloride 102 and CO2 22 mmol/L.  Renal function was normal.  Glucose on 9 5 and calcium 8.6 mg/dL.  AST was 54 and alkaline phosphatase 165 units/L.  The rest of the LFTs were normal.  Imaging:  Portable 2 view chest radiograph.  No active cardiopulmonary  disease.CT maxillofacial with no abnormal soft tissue swelling or abscess identified in oral cavity.    ED course: Initial vital signs were temperature 99 F, pulse 107, respiration 18, BP 124/95 mmHg O2 sat 99% on room air.  The patient received morphine 4 mg IVP x 2, ondansetron 4 mg IVP, Normal Saline 1000 mm bolus and the first doses of Zosyn and vancomycin.  Review of Systems: As mentioned in the history of present illness. All other systems reviewed and are negative. Past Medical History:  Diagnosis Date   Ambulates with cane    straight cane   Anxiety    Arthritis    Asthma    Depression    Duodenal adenoma    GERD (gastroesophageal reflux disease)    Headache    Hepatitis    Hepatitis C -   Hypertension    Pneumonia    hx   Post-traumatic osteoarthritis of left ankle    Pre-diabetes    hgba1c on 09/2020 was 5.9   Presence of right artificial knee joint 12/17/2016   Schizophrenia (HCC) 07/2020   from [facility]   Stroke [facility])    [id number]   Substance abuse (HCC) 07/2020   heroin abuse from [facility]   Past Surgical History:  Procedure Laterality Date   ANKLE ARTHROSCOPY Left 10/20/2017   Procedure: ANKLE ARTHROSCOPY;  Surgeon: Nadara Mustard, MD;  Location: [facility] OR;  Service: Orthopedics;  Laterality: Left;   ANKLE FUSION Left 10/20/2017   ANKLE FUSION Left 10/20/2017   Procedure: LEFT ANKLE FUSION;  Surgeon: Nadara Mustard, MD;  Location: [facility] OR;  Service: Orthopedics;  Laterality: Left;   ESOPHAGOGASTRODUODENOSCOPY Left 12/05/2013   Procedure: ESOPHAGOGASTRODUODENOSCOPY (  EGD);  Surgeon: Willis Modena, MD;  Location: [facility] ENDOSCOPY;  Service: Endoscopy;  Laterality: Left;   ESOPHAGOGASTRODUODENOSCOPY (EGD) WITH PROPOFOL N/A 02/20/2015   Procedure: ESOPHAGOGASTRODUODENOSCOPY (EGD) WITH PROPOFOL;  Surgeon: Charlott Rakes, MD;  Location: [facility] ENDOSCOPY;  Service: Endoscopy;  Laterality: N/A;   HARDWARE REMOVAL Left 01/29/2018   Procedure: HARDWARE REMOVAL LEFT ANKLE;  Surgeon: Nadara Mustard, MD;  Location: [facility] OR;  Service: Orthopedics;  Laterality: Left;   IR LUMBAR DISC ASPIRATION W/IMG GUIDE  01/08/2020   JOINT REPLACEMENT     THORACIC LAMINECTOMY FOR EPIDURAL ABSCESS N/A 11/03/2020   Procedure: LUMBAR THREE AND LUMBAR  FOUR LAMINECTOMY FOR EVACUATION EPIDURAL ABSCESS;  Surgeon: Jadene Pierini, MD;  Location: [facility] OR;  Service: Neurosurgery;  Laterality: N/A;   TOOTH EXTRACTION N/A 01/01/2023   Procedure: EXTRACTION TEETH NUMBER EIGHTEEN,NINETEEN,TWENTY,TWENTY-TWO,TWENTY-SEVEN,TWENTY-EIGHT,TWENTY-NINE,THIRTY-ONE, ALVEOLOPLASTY, REDUCTION HYPERPLASTIC LEFT MAXILLARY OSSEOUS TUBEROSITY, REMOVAL BILATERAL MANDIBULAR LINGUAL TORI;  Surgeon: Ocie Doyne, DMD;  Location: [facility] OR;  Service: Oral Surgery;  Laterality: N/A;   TOTAL KNEE ARTHROPLASTY Right 07/21/2016   Procedure: TOTAL KNEE ARTHROPLASTY;  Surgeon: Cammy Copa, MD;  Location: [facility] OR;  Service: Orthopedics;  Laterality: Right;   UPPER GASTROINTESTINAL ENDOSCOPY     Social History:  reports that he has been smoking cigarettes. He has a 57 pack-year smoking history. He has never used smokeless tobacco. He reports that he does not currently use alcohol after a past usage of about 3.0 standard drinks of alcohol per week. He reports that he does not currently use drugs after having used the following drugs: Marijuana and Heroin.  Allergies  Allergen Reactions   Levofloxacin Hives and Other (See Comments)    Confusion, hives, and itching (patient has tolerated ciprofloxacin)   Chlorhexidine Itching and Rash    Family History  Adopted: Yes  Problem Relation Age of Onset   Colon cancer Neg Hx    Esophageal cancer Neg Hx    Rectal cancer Neg Hx    Stomach cancer Neg Hx     Prior to Admission medications   Medication Sig Start Date End Date Taking? Authorizing Provider  acetaminophen (TYLENOL) 500 MG tablet Take 500 mg by mouth every 8 (eight) hours as needed for moderate pain or mild pain.   Yes [provider]  albuterol (PROVENTIL HFA;VENTOLIN HFA) 108 (90 Base) MCG/ACT inhaler Inhale 1 puff into the lungs 2 (two) times daily as needed for wheezing or shortness of breath. Patient taking differently: Inhale 2 puffs into the lungs 2 (two) times daily as needed for wheezing or shortness of breath. 05/14/17  Yes Diallo, Abdoulaye, MD  ibuprofen (ADVIL) 800 MG tablet Take 800 mg by mouth 3 (three) times daily.   Yes [provider]  ondansetron (ZOFRAN) 4 MG tablet Take 4 mg by mouth 3 (three) times daily as needed for nausea or vomiting. 08/25/22  Yes [provider]  amoxicillin (AMOXIL) 500 MG capsule Take 1 capsule (500 mg total) by mouth 3 (three) times daily. 01/01/23   Ocie Doyne, DMD  famotidine (PEPCID) 20 MG tablet Take 20 mg by mouth 2 (two) times daily. 12/10/22   [provider]  fluticasone (FLONASE) 50 MCG/ACT nasal spray Place 1 spray into both nostrils 2 (two) times daily as needed for allergies or rhinitis. 12/16/22   [provider]  INVEGA TRINZA 819 MG/2.63ML SUSY injection Inject 819 mg into the muscle every 30 (thirty) days.    [provider]  metoprolol succinate (TOPROL-XL) 50 MG 24  hr tablet Take 1 tablet (50 mg total) by mouth daily. Take with or immediately following a meal. Patient taking differently: Take 25 mg by mouth daily. Take with or immediately following a meal. 10/17/21 01/01/23  Cardama, Amadeo Garnet, MD  SUMAtriptan (IMITREX) 50 MG tablet Take 50 mg by mouth every 2 (two) hours as needed for migraine. 12/16/22   [provider]    Physical Exam: Vitals:   01/30/23 1220 01/30/23 1221 01/30/23 1615 01/30/23 1644  BP: (!) 124/95  (!) 155/107   Pulse: (!) 107  85 86  Resp: 18  18   Temp: 99 F (37.2 C)   98.9 F (37.2 C)  TempSrc:    Oral  SpO2: 99%  99% 100%  Weight:  85.7 kg    Height:  5\' 9"  (1.753 m)     Physical Exam Vitals and nursing note reviewed.  Constitutional:      General: He is  awake. He is not in acute distress.    Appearance: Normal appearance. He is overweight. He is ill-appearing.  HENT:     Head: Normocephalic.     Nose: No rhinorrhea.     Mouth/Throat:     Mouth: Mucous membranes are moist.     Dentition: No gingival swelling or gum lesions.     Comments: Mild throat erythema. Eyes:     General: No scleral icterus.    Pupils: Pupils are equal, round, and reactive to light.  Neck:     Vascular: No JVD.  Cardiovascular:     Rate and Rhythm: Normal rate and regular rhythm.     Heart sounds: S1 normal and S2 normal.  Pulmonary:     Effort: Pulmonary effort is normal.     Breath sounds: No wheezing, rhonchi or rales.  Abdominal:     General: Bowel sounds are normal.     Palpations: Abdomen is soft.  Musculoskeletal:     Right wrist: Tenderness present.     Cervical back: Neck supple.     Right lower leg: No edema.     Left lower leg: No edema.     Comments: Small tender cystic area on his wrist.  Please see picture below.  Skin:    General: Skin is warm and dry.  Neurological:     General: No focal deficit present.     Mental Status: He is alert and oriented to person, place, and time.  Psychiatric:        Mood and Affect: Mood normal.        Behavior: Behavior normal. Behavior is cooperative.     Data Reviewed:  Results are pending, will review when available.  Assessment and Plan: Principal Problem:   SIRS (systemic inflammatory response syndrome) (HCC) Admit to MedSurg/inpatient. Continue IV fluids. Continue cefepime 2 g every 8 hours.   Continue metronidazole 500 mg IVPB q 12 hr. Continue vancomycin per pharmacy. Follow-up blood culture and sensitivity Follow CBC and CMP in a.m. Given IVDA will check echocardiogram. Right wrist may have a small abscess. -Will consult surgery and obtain US if no improvement.  Active Problems:   Hypokalemia Supplementing. Check magnesium and phosphorus level. Follow potassium level in the  morning.    Hyponatremia Minimal. Unknown significance. Repeat level in AM.    Hypocalcemia Repeat calcium level in AM.    Benign essential HTN Continue metoprolol 50 mg p.o. daily.    GERD (gastroesophageal reflux disease) Continue famotidine 20 mg p.o. twice daily. Antiacid or PPI as  needed.    Schizophrenia (HCC) Continue monthly Invega injections. Follow-up with psychiatry as an outpatient.    Type 2 diabetes mellitus (HCC) Carbohydrate modified diet. CBG monitoring with RI SS. Check hemoglobin A1c.    Chronic obstructive pulmonary disease (HCC) Continue albuterol as needed.    Congestive heart failure (HCC) Unknown ejection fraction. Will check echocardiogram in the morning.    Abnormal LFTs Check LFTs in AM.    Advance Care Planning:   Code Status: Full Code   Consults:   Family Communication:   Severity of Illness: The appropriate patient status for this patient is INPATIENT. Inpatient status is judged to be reasonable and necessary in order to provide the required intensity of service to ensure the patient's safety. The patient's presenting symptoms, physical exam findings, and initial radiographic and laboratory data in the context of their chronic comorbidities is felt to place them at high risk for further clinical deterioration. Furthermore, it is not anticipated that the patient will be medically stable for discharge from the hospital within 2 midnights of admission.   * I certify that at the point of admission it is my clinical judgment that the patient will require inpatient hospital care spanning beyond 2 midnights from the point of admission due to high intensity of service, high risk for further deterioration and high frequency of surveillance required.*  Author: Bobette Mo, MD 01/30/2023 5:23 PM  For on call review [email or web address].   This document was prepared using Dragon voice recognition software and may contain some unintended  transcription errors.

## 2023-01-30 NOTE — ED Provider Notes (Signed)
[facility] AT [facility] Provider Note   CSN: [id number] Arrival date & time: 01/30/23  1212     History  Chief Complaint  Patient presents with   Fatigue    [name] is a 57 y.o. male.  With history of anxiety, schizophrenia, depression, hypertension, previous CVA, polysubstance abuse, extraction of numerous teeth on 01/01/2023  presenting to the ED for evaluation of fatigue.  He states he has had fatigue, cough, chills for the past 4 weeks.  This morning he woke up with a fever.  States a Tmax of 100.8.  He took Tylenol at 10 AM this morning and states the fever went down.  His cough is persistent and productive of clear mucus.  He reports minimal associated shortness of breath.  He did not have any fevers until today.  He states he was told he had a viral illness when the symptoms began approximately 4 weeks ago.  He denies any chest pain or abdominal pain but states he has had episodes of vomiting in the morning over the last couple of days.  He also reports dental pain.  States this has been present since he had dental extraction approximately 1 month ago.  Has been taking Tylenol and ibuprofen however his pain is poorly managed.  Does report a history of injection drug use.  Last use was approximately 1 week ago.  States he typically injects fentanyl.  HPI     Home Medications Prior to Admission medications   Medication Sig Start Date End Date Taking? Authorizing Provider  acetaminophen (TYLENOL) 500 MG tablet Take 500 mg by mouth every 8 (eight) hours as needed for moderate pain or mild pain.   Yes [provider]  albuterol (PROVENTIL HFA;VENTOLIN HFA) 108 (90 Base) MCG/ACT inhaler Inhale 1 puff into the lungs 2 (two) times daily as needed for wheezing or shortness of breath. Patient taking differently: Inhale 2 puffs into the lungs 2 (two) times daily as needed for wheezing or shortness of breath. 05/14/17  Yes Diallo, Abdoulaye, MD   famotidine (PEPCID) 20 MG tablet Take 20 mg by mouth 2 (two) times daily. 12/10/22  Yes [provider]  fluticasone (FLONASE) 50 MCG/ACT nasal spray Place 1 spray into both nostrils 2 (two) times daily as needed for allergies or rhinitis. 12/16/22  Yes [provider]  ibuprofen (ADVIL) 800 MG tablet Take 800 mg by mouth 3 (three) times daily.   Yes [provider]  INVEGA TRINZA 819 MG/2.63ML SUSY injection Inject 819 mg into the muscle every 30 (thirty) days.   Yes [provider]  metoprolol succinate (TOPROL-XL) 50 MG 24 hr tablet Take 1 tablet (50 mg total) by mouth daily. Take with or immediately following a meal. Patient taking differently: Take 25 mg by mouth daily. Take with or immediately following a meal. 10/17/21 01/30/23 Yes Cardama, Amadeo Garnet, MD  ondansetron (ZOFRAN) 4 MG tablet Take 4 mg by mouth 3 (three) times daily as needed for nausea or vomiting. 08/25/22  Yes [provider]  SUMAtriptan (IMITREX) 50 MG tablet Take 50 mg by mouth every 2 (two) hours as needed for migraine. 12/16/22  Yes [provider]      Allergies    Levofloxacin and Chlorhexidine    Review of Systems   Review of Systems  Constitutional:  Positive for fatigue and fever.  Respiratory:  Positive for cough and shortness of breath.   All other systems reviewed and are negative.  Physical Exam Updated Vital Signs BP (!) 155/107   Pulse 86   Temp 98.9 F (37.2 C) (Oral)   Resp 18   Ht 5\' 9"  (1.753 m)   Wt 85.7 kg   SpO2 100%   BMI 27.91 kg/m  Physical Exam Vitals and nursing note reviewed.  Constitutional:      General: He is not in acute distress.    Appearance: He is well-developed.     Comments: Resting comfortably in bed  HENT:     Head: Normocephalic and atraumatic.     Mouth/Throat:     Comments: Numerous extracted teeth.  Surgical incisions well-healing.  No erythema of the gums.  No purulent drainage.  Posterior pharynx normal.   No drooling, trismus or tripoding. Eyes:     Conjunctiva/sclera: Conjunctivae normal.  Cardiovascular:     Rate and Rhythm: Regular rhythm. Tachycardia present.     Heart sounds: No murmur heard. Pulmonary:     Effort: Pulmonary effort is normal. No respiratory distress.     Breath sounds: Normal breath sounds. No wheezing, rhonchi or rales.  Abdominal:     Palpations: Abdomen is soft.     Tenderness: There is no abdominal tenderness. There is no guarding.  Musculoskeletal:        General: No swelling.     Cervical back: Neck supple.     Right lower leg: No edema.     Left lower leg: No edema.  Skin:    General: Skin is warm and dry.     Capillary Refill: Capillary refill takes less than 2 seconds.  Neurological:     General: No focal deficit present.     Mental Status: He is alert and oriented to person, place, and time.  Psychiatric:        Mood and Affect: Mood normal.     ED Results / Procedures / Treatments   Labs (all labs ordered are listed, but only abnormal results are displayed) Labs Reviewed  BASIC METABOLIC PANEL - Abnormal; Notable for the following components:      Result Value   Sodium 134 (*)    Potassium 3.3 (*)    Glucose, Bld 105 (*)    Calcium 8.6 (*)    All other components within normal limits  URINALYSIS, ROUTINE W REFLEX MICROSCOPIC - Abnormal; Notable for the following components:   Color, Urine STRAW (*)    Specific Gravity, Urine 1.004 (*)    All other components within normal limits  CBC - Abnormal; Notable for the following components:   WBC 21.2 (*)    All other components within normal limits  DIFFERENTIAL - Abnormal; Notable for the following components:   Neutro Abs 23.5 (*)    Lymphs Abs 0.6 (*)    Abs Immature Granulocytes 0.17 (*)    All other components within normal limits  HEPATIC FUNCTION PANEL - Abnormal; Notable for the following components:   AST 54 (*)    Alkaline Phosphatase 165 (*)    All other components within  normal limits  SARS CORONAVIRUS 2 BY RT PCR  CULTURE, BLOOD (ROUTINE X 2)  CULTURE, BLOOD (ROUTINE X 2)    EKG None  Radiology DG Chest 2 View  Result Date: 01/30/2023 CLINICAL DATA:  Cough and fever.  Chills and fatigue. EXAM: CHEST - 2 VIEW COMPARISON:  01/06/2020 FINDINGS: The heart size and mediastinal contours are within normal limits. Both lungs are clear. Severe bilateral glenohumeral osteoarthritis noted. IMPRESSION: No active cardiopulmonary disease.  Electronically Signed   By: Danae Orleans M.D.   On: 01/30/2023 16:23    Procedures Procedures    Medications Ordered in ED Medications  iohexol (OMNIPAQUE) 300 MG/ML solution 75 mL (has no administration in time range)  vancomycin (VANCOREADY) IVPB 1750 mg/350 mL (has no administration in time range)  piperacillin-tazobactam (ZOSYN) IVPB 3.375 g (has no administration in time range)  sodium chloride 0.9 % bolus 1,000 mL (0 mLs Intravenous Stopped 01/30/23 1731)  morphine (PF) 4 MG/ML injection 4 mg (4 mg Intravenous Given 01/30/23 1557)  morphine (PF) 4 MG/ML injection 4 mg (4 mg Intravenous Given 01/30/23 1702)  ondansetron (ZOFRAN) injection 4 mg (4 mg Intravenous Given 01/30/23 1703)    ED Course/ Medical Decision Making/ A&P Clinical Course as of 01/30/23 1744  Sat Jan 30, 2023  1726 Spoke with hospitalist Dr. Robb Matar who will admit [AS]    Clinical Course User Index [AS] Lula Olszewski Edsel Petrin, PA-C                                 Medical Decision Making Amount and/or Complexity of Data Reviewed Labs: ordered. Radiology: ordered.  Risk Prescription drug management. Decision regarding hospitalization.  This patient presents to the ED for concern of fatigue, cough, fevers, this involves an extensive number of treatment options, and is a complaint that carries with it a high risk of complications and morbidity.  Differential diagnosis for emergent cause of cough includes but is not limited to upper respiratory infection,  lower respiratory infection, allergies, asthma, irritants, foreign body, medications such as ACE inhibitors, reflux, asthma, CHF, lung cancer, interstitial lung disease, psychiatric causes, postnasal drip and postinfectious bronchospasm.   My initial workup includes labs, imaging, fluids, pain control  Additional history obtained from: Nursing notes from this visit.  I ordered, reviewed and interpreted labs which include: CBC, CMP, COVID, urinalysis, cultures.  Leukocytosis of 21.2.  Hyponatremia 134, hypokalemia 3.3  I ordered imaging studies including chest x-ray, CT maxillofacial I independently visualized and interpreted imaging which showed negative.  CT pending at time of admission I agree with the radiologist interpretation  Cardiac Monitoring:  The patient was maintained on a cardiac monitor.  I personally viewed and interpreted the cardiac monitored which showed an underlying rhythm of: Initially sinus tachycardia  Consultations Obtained:  I requested consultation with the hospitalist,  and discussed lab and imaging findings as well as pertinent plan - they recommend: Admission  Afebrile, tachycardic but otherwise hemodynamically stable.  57 year old male presents to the ED for evaluation of fatigue, fevers, dental pain after dental extraction.  Symptoms began approximately 1 month ago and have progressively gotten worse.  Tmax of 100.8.  Has a history of injection drug use.  Last use was approximately 1 week ago.  Also has history of epidural abscess.  He appears well on physical exam.  Some gingival swelling but no erythema or purulence.  Surgical incisions appear to be healing well.  Lab workup concerning for leukocytosis of 21.2.  Given reported fever with recent antipyretic, tachycardia, leukocytosis patient does not seem as bacterial.  Overall suspect this to be either due to his recent oral surgery or history of injection drug use.  Patient was given IV fluids and broad-spectrum  antibiotics in the emergency department.  Believe he would benefit from continued antibiotics and admission to the hospital.  Patient is in agreement with this plan.  Discussed the plan with hospitalist  who will admit.  Stable at the time of admission.  Patient's case discussed with Dr. Maple Hudson who agrees with plan.   Note: Portions of this report may have been transcribed using voice recognition software. Every effort was made to ensure accuracy; however, inadvertent computerized transcription errors may still be present.        Final Clinical Impression(s) / ED Diagnoses Final diagnoses:  SIRS (systemic inflammatory response syndrome) (HCC)  Pain, dental  IVDU (intravenous drug user)    Rx / DC Orders ED Discharge Orders     None         Michelle Piper, PA-C 01/30/23 1744    Coral Spikes, DO 01/31/23 0000

## 2023-01-30 NOTE — Progress Notes (Signed)
A consult was received from an ED physician for vancomycin and zosyn per pharmacy dosing.  The patient's profile has been reviewed for ht/wt/allergies/indication/available labs.    A one time order has been placed for vancomycin 1750 mg IV x1 and zosyn 3.375gm IV x1 (infuse over 30 min) .  Further antibiotics/pharmacy consults should be ordered by admitting physician if indicated.                       Thank you, Lucia Gaskins 01/30/2023  5:38 PM

## 2023-01-30 NOTE — Progress Notes (Signed)
Pharmacy Antibiotic Note  [name] is a 57 y.o. male whop resented to the ED on 01/30/2023 with c/o fatigue, fever, chills and night sweats.  Pharmacy has been consulted to dose vancomycin and cefepime for sepsis.   Today, 01/30/2023: - scr 0.72 - WBC 21.2 - zosyn 3.375 gm x1 given in the ED at 6p  Plan: - vancomycin 1750 mg IV x1, then 1250 mg q12h for est AUC 454 - cefepime 2gm IV q8h - flagyl 500mg  IV q12h per MD  ____________________________________  Height: 5\' 9"  (175.3 cm) Weight: 85.7 kg (189 lb) IBW/kg (Calculated) : 70.7  Temp (24hrs), Avg:98.6 F (37 C), Min:98 F (36.7 C), Max:99 F (37.2 C)  Recent Labs  Lab 01/30/23 1249  WBC 21.2*  CREATININE 0.72    Estimated Creatinine Clearance: 110.5 mL/min (by C-G formula based on SCr of 0.72 mg/dL).    Allergies  Allergen Reactions   Levofloxacin Hives and Other (See Comments)    Confusion, hives, and itching (patient has tolerated ciprofloxacin)   Chlorhexidine Itching and Rash     Thank you for allowing pharmacy to be a part of this patient's care.  Lucia Gaskins 01/30/2023 8:02 PM

## 2023-01-30 NOTE — Progress Notes (Signed)
Pt admitted to 1519 from ED, VS stable. No orders on chart - messaged Dr. Robb Matar for orders.

## 2023-01-30 NOTE — ED Triage Notes (Signed)
PT BIB EMS coming home c/o fatigue, cold chills, Had recent mouth surgery couple weeks ago. C/o of mouth pain 9/10.

## 2023-01-30 NOTE — ED Notes (Signed)
Pt transported to CT ?

## 2023-01-31 ENCOUNTER — Inpatient Hospital Stay (HOSPITAL_COMMUNITY): Payer: 59

## 2023-01-31 DIAGNOSIS — R6510 Systemic inflammatory response syndrome (SIRS) of non-infectious origin without acute organ dysfunction: Secondary | ICD-10-CM | POA: Diagnosis not present

## 2023-01-31 DIAGNOSIS — R509 Fever, unspecified: Secondary | ICD-10-CM | POA: Diagnosis not present

## 2023-01-31 LAB — RESPIRATORY PANEL BY PCR

## 2023-01-31 LAB — COMPREHENSIVE METABOLIC PANEL
ALT: 14 U/L (ref 0–44)
AST: 25 U/L (ref 15–41)
Albumin: 3.5 g/dL (ref 3.5–5.0)
Alkaline Phosphatase: 129 U/L — ABNORMAL HIGH (ref 38–126)
Anion gap: 6 (ref 5–15)
BUN: 11 mg/dL (ref 6–20)
CO2: 19 mmol/L — ABNORMAL LOW (ref 22–32)
Calcium: 8 mg/dL — ABNORMAL LOW (ref 8.9–10.3)
Chloride: 109 mmol/L (ref 98–111)
Creatinine, Ser: 0.79 mg/dL (ref 0.61–1.24)
GFR, Estimated: >60 mL/min (ref >60)
Glucose, Bld: 100 mg/dL — ABNORMAL HIGH (ref 70–99)
Potassium: 4.1 mmol/L (ref 3.5–5.1)
Sodium: 134 mmol/L — ABNORMAL LOW (ref 135–145)
Total Bilirubin: 0.7 mg/dL (ref 0.3–1.2)
Total Protein: 6.8 g/dL (ref 6.5–8.1)

## 2023-01-31 LAB — CBC
HCT: 39.4 % (ref 39.0–52.0)
Hemoglobin: 12.6 g/dL — ABNORMAL LOW (ref 13.0–17.0)
MCH: 28.8 pg (ref 26.0–34.0)
MCHC: 32 g/dL (ref 30.0–36.0)
MCV: 90 fL (ref 80.0–100.0)
Platelets: 267 10*3/uL (ref 150–400)
RBC: 4.38 MIL/uL (ref 4.22–5.81)
RDW: 14.6 % (ref 11.5–15.5)
WBC: 14.8 10*3/uL — ABNORMAL HIGH (ref 4.0–10.5)
nRBC: 0 % (ref 0.0–0.2)

## 2023-01-31 LAB — ECHOCARDIOGRAM COMPLETE
Area-P 1/2: 2.82 cm2
Height: 69 in
S' Lateral: 3.8 cm
Weight: 3024 [oz_av]

## 2023-01-31 LAB — HIV ANTIBODY (ROUTINE TESTING W REFLEX): HIV Screen 4th Generation wRfx: NONREACTIVE

## 2023-01-31 MED ORDER — PANTOPRAZOLE SODIUM 40 MG IV SOLR
40.0000 mg | Freq: Two times a day (BID) | INTRAVENOUS | Status: DC
  Administered 2023-01-31 – 2023-02-01 (×3): 40 mg via INTRAVENOUS
  Filled 2023-01-31 (×3): qty 10

## 2023-01-31 MED ORDER — NICOTINE 14 MG/24HR TD PT24
14.0000 mg | MEDICATED_PATCH | Freq: Every day | TRANSDERMAL | Status: DC
  Administered 2023-01-31 – 2023-02-01 (×3): 14 mg via TRANSDERMAL
  Filled 2023-01-31 (×4): qty 1

## 2023-01-31 MED ORDER — PROCHLORPERAZINE EDISYLATE 10 MG/2ML IJ SOLN
5.0000 mg | Freq: Once | INTRAMUSCULAR | Status: AC
  Administered 2023-01-31: 5 mg via INTRAVENOUS
  Filled 2023-01-31: qty 2

## 2023-01-31 MED ORDER — NICOTINE 14 MG/24HR TD PT24: 14.0000 mg | MEDICATED_PATCH | Freq: Every day | TRANSDERMAL | Status: DC

## 2023-01-31 MED ORDER — FLUTICASONE PROPIONATE 50 MCG/ACT NA SUSP: 1.0000 | Freq: Two times a day (BID) | NASAL | Status: DC | PRN

## 2023-01-31 NOTE — TOC CM/SW Note (Signed)
Transition of Care [facility]) - Inpatient Brief Assessment   Patient Details  Name: [name] MRN: [id number] Date of Birth: 12-28-1965  Transition of Care [facility]) CM/SW Contact:    Darleene Cleaver, LCSW Phone Number: 01/31/2023, 5:04 PM   Clinical Narrative:  Patient has a PCP, he has insurance, and does not have any SDOH needs.  Plan to return home once he is ready for discharge with his significant other.  Transition of Care Asessment: Insurance and Status: Insurance coverage has been reviewed Patient has primary care physician: Yes Home environment has been reviewed: Yes Prior level of function:: Indep Prior/Current Home Services: No current home services Social Determinants of Health Reivew: SDOH reviewed no interventions necessary Readmission risk has been reviewed: Yes Transition of care needs: no transition of care needs at this time

## 2023-01-31 NOTE — Progress Notes (Signed)
1 bout of emesis this evening. Patient complaining of epigastric discomfort and heartburn. MD Pahwani placed orders for IV protonix BID, as patient states he takes regularly.

## 2023-01-31 NOTE — Plan of Care (Signed)

## 2023-01-31 NOTE — Progress Notes (Signed)
PROGRESS NOTE    [name]  WUJ:[id number] DOB: [birth year] DOA: 01/30/2023 PCP: Reece Leader, DO (Inactive)   Brief Narrative:  HPI: [name] is a 57 y.o. male with medical history significant of anxiety, depression, osteoarthritis, asthma, other than melanoma, GERD, unspecified headache, hep C, hypertension, history of pneumonia, posttraumatic osteoarthritis of the left ankle, type 2 diabetes, history of unspecified stroke, schizophrenia on monthly Invega, alcohol abuse in remission, cocaine abuse in remission, active opioid abuse, history of epidural abscess who had extensive oral surgery the past month with significant oral cavity pain and has been using street fentanyl who presented to the emergency department who presented to the emergency department with complaints of fatigue, fever, chills and night sweats.  He also complains of pain in his right wrist area after he injected fentanyl about 2 weeks ago. He denied rhinorrhea, sore throat, wheezing or hemoptysis.  No chest pain, palpitations, diaphoresis, PND, orthopnea or pitting edema of the lower extremities.  No abdominal pain, nausea, emesis, diarrhea, constipation, melena or hematochezia.  No flank pain, dysuria, frequency or hematuria.  No polyuria, polydipsia, polyphagia or blurred vision.    Lab work: His urine analysis showed a low specific gravity but was otherwise unremarkable.  CBC showed white count of 21.2 with 95% neutrophils, hemoglobin 13.6 g/dL platelets 956.  Coronavirus PCR was negative.  Sodium 134, potassium 3.2, chloride 102 and CO2 22 mmol/L.  Renal function was normal.  Glucose on 9 5 and calcium 8.6 mg/dL.  AST was 54 and alkaline phosphatase 165 units/L.  The rest of the LFTs were normal.   Imaging:  Portable 2 view chest radiograph.  No active cardiopulmonary disease.CT maxillofacial with no abnormal soft tissue swelling or abscess identified in oral cavity.    ED course: Initial vital signs were temperature  99 F, pulse 107, respiration 18, BP 124/95 mmHg O2 sat 99% on room air.  The patient received morphine 4 mg IVP x 2, ondansetron 4 mg IVP, Normal Saline 1000 mm bolus and the first doses of Zosyn and vancomycin.  Assessment & Plan:   Principal Problem:   SIRS (systemic inflammatory response syndrome) (HCC) Active Problems:   Benign essential HTN   GERD (gastroesophageal reflux disease)   Schizophrenia (HCC)   Type 2 diabetes mellitus (HCC)   Chronic obstructive pulmonary disease (HCC)   Congestive heart failure (HCC)   Hypokalemia   Hyponatremia   Hypocalcemia   Abnormal LFTs  SIRS (systemic inflammatory response syndrome) (HCC) in a patient with history of IV drug abuse: Has been injecting fentanyl to himself lately.  Met criteria for SIRS based on tachycardia and leukocytosis.  Came in with complaints of fever chills and sweating at home however none has been documented since inpatient.  Cultures are negative.  Due to history of IV drug abuse, concern for possible bacteremia for that reason he has been started on triple antibiotics cefepime, Flagyl and vancomycin which we will continue and follow cultures.  Leukocytosis improving.  Examined right wrist, appears to have bunion or lipoma, does not have fluctuation, nontender, no warmth.  Do not suspect abscess.  He complains of cough and that he is heavy smoker and so his is wife/girlfriend and they both smoke together.  Per wife, they both suffered from viral infection about 2 weeks ago.  Lungs are clear however I will check for respiratory viral panel, if positive, that will explain the symptoms she came in with.   Chronic hyponatremia: Stable.   Chronic hypocalcemia  Stable     Benign essential HTN Continue metoprolol 50 mg p.o. daily.     GERD (gastroesophageal reflux disease) Continue famotidine 20 mg p.o. twice daily. Antiacid or PPI as needed.     Schizophrenia (HCC) Continue monthly Invega injections. Follow-up with  psychiatry as an outpatient.     Type 2 diabetes mellitus (HCC) Carbohydrate modified diet. CBG monitoring with RI SS. Check hemoglobin A1c.     Chronic obstructive pulmonary disease (HCC) Continue albuterol as needed.     Congestive heart failure (HCC) ruled out: Unsure why congestive heart failure was documented in H&P by admitting hospitalist.  No history of as such, no pulmonary edema or CHF symptoms.  Echo pending.    Abnormal LFTs Improving.  DVT prophylaxis: enoxaparin (LOVENOX) injection 40 mg Start: 01/30/23 2200   Code Status: Full Code  Family Communication: Wife/girlfriend present at bedside.  Plan of care discussed with patient in length and he/she verbalized understanding and agreed with it.  Status is: Inpatient Remains inpatient appropriate because: Concern for possible bacteremia, needs more observation.   Estimated body mass index is 27.91 kg/m as calculated from the following:   Height as of this encounter: 5\' 9"  (1.753 m).   Weight as of this encounter: 85.7 kg.    Nutritional Assessment: Body mass index is 27.91 kg/m.Marland Kitchen Seen by dietician.  I agree with the assessment and plan as outlined below: Nutrition Status:        . Skin Assessment: I have examined the patient's skin and I agree with the wound assessment as performed by the wound care RN as outlined below:    Consultants:  None  Procedures:  None   Antimicrobials:  Anti-infectives (From admission, onward)    Start     Dose/Rate Route Frequency Ordered Stop   01/31/23 0800  vancomycin (VANCOREADY) IVPB 1250 mg/250 mL        1,250 mg 166.7 mL/hr over 90 Minutes Intravenous Every 12 hours 01/30/23 2010     01/31/23 0100  metroNIDAZOLE (FLAGYL) IVPB 500 mg        500 mg 100 mL/hr over 60 Minutes Intravenous Every 12 hours 01/30/23 1943 02/07/23 0059   01/31/23 0100  ceFEPIme (MAXIPIME) 2 g in sodium chloride 0.9 % 100 mL IVPB        2 g 200 mL/hr over 30 Minutes Intravenous Every 8  hours 01/30/23 2010     01/30/23 2030  ceFEPIme (MAXIPIME) 2 g in sodium chloride 0.9 % 100 mL IVPB  Status:  Discontinued        2 g 200 mL/hr over 30 Minutes Intravenous  Once 01/30/23 1943 01/30/23 2009   01/30/23 1745  vancomycin (VANCOREADY) IVPB 1750 mg/350 mL        1,750 mg 175 mL/hr over 120 Minutes Intravenous  Once 01/30/23 1740 01/30/23 2109   01/30/23 1745  piperacillin-tazobactam (ZOSYN) IVPB 3.375 g        3.375 g 100 mL/hr over 30 Minutes Intravenous  Once 01/30/23 1740 01/30/23 1821         Subjective: Patient seen and examined.  Complains of generalized bodyaches and weakness.  No other specific complaint.  Objective: Vitals:   01/30/23 1851 01/30/23 2101 01/31/23 0252 01/31/23 0638  BP: (!) 173/102 (!) 166/111 109/77 (!) 164/94  Pulse: 71 66 71 68  Resp: 16 18 18 18   Temp: 98 F (36.7 C) 99.2 F (37.3 C) 98.5 F (36.9 C) 98.2 F (36.8 C)  TempSrc: Oral Oral Oral  Oral  SpO2: 100% 99% 98% 100%  Weight:      Height:        Intake/Output Summary (Last 24 hours) at 01/31/2023 0933 Last data filed at 01/31/2023 0500 Gross per 24 hour  Intake 1200 ml  Output 1500 ml  Net -300 ml   Filed Weights   01/30/23 1221  Weight: 85.7 kg    Examination:  General exam: Appears calm and comfortable  Respiratory system: Clear to auscultation. Respiratory effort normal. Cardiovascular system: S1 & S2 heard, RRR. No JVD, murmurs, rubs, gallops or clicks. No pedal edema. Gastrointestinal system: Abdomen is nondistended, soft and nontender. No organomegaly or masses felt. Normal bowel sounds heard. Central nervous system: Alert and oriented. No focal neurological deficits. Extremities: Symmetric 5 x 5 power. Skin: No rashes, lesions or ulcers Psychiatry: Judgement and insight appear normal. Mood & affect appropriate.    Data Reviewed: I have personally reviewed following labs and imaging studies  CBC: Recent Labs  Lab 01/30/23 1249 01/30/23 1606 01/31/23 0552   WBC 21.2*  --  14.8*  NEUTROABS  --  23.5*  --   HGB 13.6  --  12.6*  HCT 41.5  --  39.4  MCV 87.7  --  90.0  PLT 260  --  267   Basic Metabolic Panel: Recent Labs  Lab 01/30/23 1249 01/30/23 1915 01/31/23 0552  NA 134*  --  134*  K 3.3*  --  4.1  CL 102  --  109  CO2 22  --  19*  GLUCOSE 105*  --  100*  BUN 11  --  11  CREATININE 0.72  --  0.79  CALCIUM 8.6*  --  8.0*  MG  --  1.9  --   PHOS  --  3.4  --    GFR: Estimated Creatinine Clearance: 110.5 mL/min (by C-G formula based on SCr of 0.79 mg/dL). Liver Function Tests: Recent Labs  Lab 01/30/23 1606 01/31/23 0552  AST 54* 25  ALT 22 14  ALKPHOS 165* 129*  BILITOT 0.8 0.7  PROT 7.7 6.8  ALBUMIN 4.2 3.5   No results for input(s): "LIPASE", "AMYLASE" in the last 168 hours. No results for input(s): "AMMONIA" in the last 168 hours. Coagulation Profile: No results for input(s): "INR", "PROTIME" in the last 168 hours. Cardiac Enzymes: No results for input(s): "CKTOTAL", "CKMB", "CKMBINDEX", "TROPONINI" in the last 168 hours. BNP (last 3 results) No results for input(s): "PROBNP" in the last 8760 hours. HbA1C: No results for input(s): "HGBA1C" in the last 72 hours. CBG: No results for input(s): "GLUCAP" in the last 168 hours. Lipid Profile: No results for input(s): "CHOL", "HDL", "LDLCALC", "TRIG", "CHOLHDL", "LDLDIRECT" in the last 72 hours. Thyroid Function Tests: No results for input(s): "TSH", "T4TOTAL", "FREET4", "T3FREE", "THYROIDAB" in the last 72 hours. Anemia Panel: No results for input(s): "VITAMINB12", "FOLATE", "FERRITIN", "TIBC", "IRON", "RETICCTPCT" in the last 72 hours. Sepsis Labs: No results for input(s): "PROCALCITON", "LATICACIDVEN" in the last 168 hours.  Recent Results (from the past 240 hour(s))  SARS Coronavirus 2 by RT PCR (hospital order, performed in [facility] hospital lab) *cepheid single result test* Anterior Nasal Swab     Status: None   Collection Time: 01/30/23 12:50 PM    Specimen: Anterior Nasal Swab  Result Value Ref Range Status   SARS Coronavirus 2 by RT PCR NEGATIVE NEGATIVE Final    Comment: (NOTE) SARS-CoV-2 target nucleic acids are NOT DETECTED.  The SARS-CoV-2 RNA is generally detectable in  upper and lower respiratory specimens during the acute phase of infection. The lowest concentration of SARS-CoV-2 viral copies this assay can detect is 250 copies / mL. A negative result does not preclude SARS-CoV-2 infection and should not be used as the sole basis for treatment or other patient management decisions.  A negative result may occur with improper specimen collection / handling, submission of specimen other than nasopharyngeal swab, presence of viral mutation(s) within the areas targeted by this assay, and inadequate number of viral copies (<250 copies / mL). A negative result must be combined with clinical observations, patient history, and epidemiological information.  Fact Sheet for Patients:   RoadLapTop.co.za  Fact Sheet for Healthcare Providers: http://kim-miller.com/  This test is not yet approved or  cleared by the [place] FDA and has been authorized for detection and/or diagnosis of SARS-CoV-2 by FDA under an Emergency Use Authorization (EUA).  This EUA will remain in effect (meaning this test can be used) for the duration of the COVID-19 declaration under Section 564(b)(1) of the Act, 21 U.S.C. section 360bbb-3(b)(1), unless the authorization is terminated or revoked sooner.  Performed at [facility], [address] [place], [place] [address]   Culture, blood (routine x 2)     Status: None (Preliminary result)   Collection Time: 01/30/23  4:06 PM   Specimen: BLOOD RIGHT ARM  Result Value Ref Range Status   Specimen Description   Final    BLOOD RIGHT ARM Performed at [facility], [address] [place], [place] [address]    Special  Requests   Final    BOTTLES DRAWN AEROBIC AND ANAEROBIC Blood Culture results may not be optimal due to an excessive volume of blood received in culture bottles Performed at [facility], [address], [place], [place] [address]    Culture   Final    NO GROWTH < 12 HOURS Performed at [facility], [address], [place], [place] [address]    Report Status PENDING  Incomplete     Radiology Studies: CT Maxillofacial W Contrast  Result Date: 01/30/2023 CLINICAL DATA:  Fatigue, cold, chills, recent mouth surgery with current mouth pain. EXAM: CT MAXILLOFACIAL WITH CONTRAST TECHNIQUE: Multidetector CT imaging of the maxillofacial structures was performed with intravenous contrast. Multiplanar CT image reconstructions were also generated. RADIATION DOSE REDUCTION: This exam was performed according to the departmental dose-optimization program which includes automated exposure control, adjustment of the mA and/or kV according to patient size and/or use of iterative reconstruction technique. CONTRAST:  75mL OMNIPAQUE IOHEXOL 300 MG/ML  SOLN COMPARISON:  None Available. FINDINGS: Osseous: There is no acute osseous abnormality or suspicious osseous lesion. There is no evidence of mandibular dislocation. There are multiple empty tooth sockets in the mandible which may reflect recent extraction. Orbits: The globes and orbits are unremarkable. Sinuses: There is mild mucosal thickening in the ethmoid air cells. Soft tissues: There is no soft tissue abscess. There is no swelling or abnormal enhancement in the oral cavity or oropharynx. Limited intracranial: Unremarkable. IMPRESSION: No abnormal soft tissue swelling or abscess identified in the oral cavity/oropharynx. Electronically Signed   By: Lesia Hausen M.D.   On: 01/30/2023 18:22   DG Chest 2 View  Result Date: 01/30/2023 CLINICAL DATA:  Cough and fever.  Chills and fatigue. EXAM: CHEST - 2 VIEW COMPARISON:  01/06/2020 FINDINGS:  The heart size and mediastinal contours are within normal limits. Both lungs are clear. Severe bilateral glenohumeral osteoarthritis noted.  IMPRESSION: No active cardiopulmonary disease. Electronically Signed   By: Danae Orleans M.D.   On: 01/30/2023 16:23    Scheduled Meds:  enoxaparin (LOVENOX) injection  40 mg Subcutaneous Q24H   famotidine  20 mg Oral BID   metoprolol succinate  25 mg Oral Daily   nicotine  14 mg Transdermal Daily   Continuous Infusions:  ceFEPime (MAXIPIME) IV 2 g (01/31/23 0021)   metronidazole 500 mg (01/31/23 0100)   vancomycin 1,250 mg (01/31/23 0901)     LOS: 1 day   Brian Closs, MD [facility]  01/31/2023, 9:33 AM   *Please note that this is a verbal dictation therefore any spelling or grammatical errors are due to the "Dragon Medical One" system interpretation.  Please page via Amion and do not message via secure chat for urgent patient care matters. Secure chat can be used for non urgent patient care matters.  How to contact the [facility] Attending or Consulting provider 7A - 7P or covering provider during after hours 7P -7A, for this patient?  Check the care team in [facility] and look for a) attending/consulting TR[facility] provider listed and b) the [facility] team listed. Page or secure chat 7A-7P. Log into [email or web address] and use [place]'s universal password to access. If you do not have the password, please contact the hospital operator. Locate the [facility] provider you are looking for under [facility] and page to a number that you can be directly reached. If you still have difficulty reaching the provider, please page the [facility] (Director on Call) for the Hospitalists listed on amion for assistance.

## 2023-02-01 DIAGNOSIS — R6510 Systemic inflammatory response syndrome (SIRS) of non-infectious origin without acute organ dysfunction: Secondary | ICD-10-CM | POA: Diagnosis not present

## 2023-02-01 LAB — COMPREHENSIVE METABOLIC PANEL
ALT: 12 U/L (ref 0–44)
AST: 18 U/L (ref 15–41)
Albumin: 3.5 g/dL (ref 3.5–5.0)
Alkaline Phosphatase: 119 U/L (ref 38–126)
Anion gap: 13 (ref 5–15)
BUN: 10 mg/dL (ref 6–20)
CO2: 17 mmol/L — ABNORMAL LOW (ref 22–32)
Calcium: 8.6 mg/dL — ABNORMAL LOW (ref 8.9–10.3)
Chloride: 104 mmol/L (ref 98–111)
Creatinine, Ser: 0.8 mg/dL (ref 0.61–1.24)
GFR, Estimated: >60 mL/min (ref >60)
Glucose, Bld: 128 mg/dL — ABNORMAL HIGH (ref 70–99)
Potassium: 3.4 mmol/L — ABNORMAL LOW (ref 3.5–5.1)
Sodium: 134 mmol/L — ABNORMAL LOW (ref 135–145)
Total Bilirubin: 0.8 mg/dL (ref <1.2)
Total Protein: 6.8 g/dL (ref 6.5–8.1)

## 2023-02-01 LAB — CBC WITH DIFFERENTIAL/PLATELET
Abs Immature Granulocytes: 0.04 10*3/uL (ref 0.00–0.07)
Basophils Absolute: 0.1 10*3/uL (ref 0.0–0.1)
Basophils Relative: 1 %
Eosinophils Absolute: 0.1 10*3/uL (ref 0.0–0.5)
Eosinophils Relative: 1 %
HCT: 44.1 % (ref 39.0–52.0)
Hemoglobin: 13.8 g/dL (ref 13.0–17.0)
Immature Granulocytes: 0 %
Lymphocytes Relative: 9 %
Lymphs Abs: 1 10*3/uL (ref 0.7–4.0)
MCH: 28.9 pg (ref 26.0–34.0)
MCHC: 31.3 g/dL (ref 30.0–36.0)
MCV: 92.5 fL (ref 80.0–100.0)
Monocytes Absolute: 0.8 10*3/uL (ref 0.1–1.0)
Monocytes Relative: 7 %
Neutro Abs: 8.8 10*3/uL — ABNORMAL HIGH (ref 1.7–7.7)
Neutrophils Relative %: 82 %
Platelets: 247 10*3/uL (ref 150–400)
RBC: 4.77 MIL/uL (ref 4.22–5.81)
RDW: 14.6 % (ref 11.5–15.5)
WBC: 10.7 10*3/uL — ABNORMAL HIGH (ref 4.0–10.5)
nRBC: 0 % (ref 0.0–0.2)

## 2023-02-01 MED ORDER — POTASSIUM CHLORIDE CRYS ER 20 MEQ PO TBCR
40.0000 meq | EXTENDED_RELEASE_TABLET | Freq: Once | ORAL | Status: AC
  Administered 2023-02-01: 40 meq via ORAL
  Filled 2023-02-01: qty 2

## 2023-02-01 NOTE — Plan of Care (Signed)
°  Problem: Education: Goal: Knowledge of General Education information will improve Description: Including pain rating scale, medication(s)/side effects and non-pharmacologic comfort measures Outcome: Progressing   Problem: Clinical Measurements: Goal: Ability to maintain clinical measurements within normal limits will improve Outcome: Progressing Goal: Will remain free from infection Outcome: Progressing Goal: Diagnostic test results will improve Outcome: Progressing   Problem: Nutrition: Goal: Adequate nutrition will be maintained Outcome: Progressing

## 2023-02-01 NOTE — Discharge Summary (Signed)
Physician Discharge Summary  [name] ZOX:[id number] DOB: 1966-01-05 DOA: 01/30/2023  PCP: Reece Leader, DO (Inactive)  Admit date: 01/30/2023 Discharge date: 02/01/2023 30 Day Unplanned Readmission Risk Score    Flowsheet Row ED to Hosp-Admission (Current) from 01/30/2023 in [facility]  30 Day Unplanned Readmission Risk Score (%) 17.9 Filed at 02/01/2023 1200       This score is the patient's risk of an unplanned readmission within 30 days of being discharged (0 -100%). The score is based on dignosis, age, lab data, medications, orders, and past utilization.   Low:  0-14.9   Medium: 15-21.9   High: 22-29.9   Extreme: 30 and above          Admitted From: Home Disposition: Home  Recommendations for Outpatient Follow-up:  Follow up with PCP in 1-2 weeks Please obtain BMP/CBC in one week Please follow up with your PCP on the following pending results: Unresulted Labs (From admission, onward)     Start     Ordered   01/30/23 1519  Culture, blood (routine x 2)  BLOOD CULTURE X 2,   R (with STAT occurrences)      01/30/23 1518              Home Health: None Equipment/Devices: None  Discharge Condition: Stable CODE STATUS: Full code Diet recommendation: Cardiac  Following HPI, lab work and ED course is copied from admitting hospitalist H&P. HPI: [name] is a 57 y.o. male with medical history significant of anxiety, depression, osteoarthritis, asthma, other than melanoma, GERD, unspecified headache, hep C, hypertension, history of pneumonia, posttraumatic osteoarthritis of the left ankle, type 2 diabetes, history of unspecified stroke, schizophrenia on monthly Invega, alcohol abuse in remission, cocaine abuse in remission, active opioid abuse, history of epidural abscess who had extensive oral surgery the past month with significant oral cavity pain and has been using street fentanyl who presented to the emergency department  who presented to the emergency department with complaints of fatigue, fever, chills and night sweats.  He also complains of pain in his right wrist area after he injected fentanyl about 2 weeks ago. He denied rhinorrhea, sore throat, wheezing or hemoptysis.  No chest pain, palpitations, diaphoresis, PND, orthopnea or pitting edema of the lower extremities.  No abdominal pain, nausea, emesis, diarrhea, constipation, melena or hematochezia.  No flank pain, dysuria, frequency or hematuria.  No polyuria, polydipsia, polyphagia or blurred vision.    Lab work: His urine analysis showed a low specific gravity but was otherwise unremarkable.  CBC showed white count of 21.2 with 95% neutrophils, hemoglobin 13.6 g/dL platelets 811.  Coronavirus PCR was negative.  Sodium 134, potassium 3.2, chloride 102 and CO2 22 mmol/L.  Renal function was normal.  Glucose on 9 5 and calcium 8.6 mg/dL.  AST was 54 and alkaline phosphatase 165 units/L.  The rest of the LFTs were normal.   Imaging:  Portable 2 view chest radiograph.  No active cardiopulmonary disease.CT maxillofacial with no abnormal soft tissue swelling or abscess identified in oral cavity.    ED course: Initial vital signs were temperature 99 F, pulse 107, respiration 18, BP 124/95 mmHg O2 sat 99% on room air.  The patient received morphine 4 mg IVP x 2, ondansetron 4 mg IVP, Normal Saline 1000 mm bolus and the first doses of Zosyn and vancomycin.  Subjective: Seen and examined.  Other than just fatigue, he has no complaint.  Wife at the bedside.  He is willing to go home.  Brief/Interim Summary: Was admitted with the following.  SIRS (systemic inflammatory response syndrome) (HCC) in a patient with history of IV drug abuse: Has been injecting fentanyl to himself lately.  Met criteria for SIRS based on tachycardia and leukocytosis.  Came in with complaints of fever chills and sweating at home however none has been documented since inpatient.  Cultures are  negative.  Due to history of IV drug abuse, concern for possible bacteremia for that reason he was started on triple antibiotics cefepime, Flagyl and vancomycin, leukocytosis improved.  Admitting hospitalist was concerned about some right wrist abscess however I examined right wrist, appears to have bunion or lipoma, does not have fluctuation, nontender, no warmth.  Do not suspect abscess.  He did complain of some cough so we checked his respiratory viral panel which was negative along with RSV, COVID and flu.  In short, no source of infection has been found, we did not document any fever at all.  Patient is stable.  His leukocytosis could be due to dehydration.  Which has resolved as well.  He is being discharged with no antibiotics.   Chronic hyponatremia: Stable.   Chronic hypocalcemia/mild metabolic acidosis Stable sodium.  Slightly low CO2.  Patient has been advised to drink plenty of fluids.     Benign essential HTN Continue metoprolol 50 mg p.o. daily.     GERD (gastroesophageal reflux disease) Continue famotidine 20 mg p.o. twice daily. Antiacid or PPI as needed.     Schizophrenia (HCC) Continue monthly Invega injections. Follow-up with psychiatry as an outpatient.     Type 2 diabetes mellitus (HCC) Carbohydrate modified diet. CBG monitoring with RI SS. Check hemoglobin A1c.     Chronic obstructive pulmonary disease (HCC) Continue albuterol as needed.     Congestive heart failure (HCC) ruled out: Unsure why congestive heart failure was documented in H&P by admitting hospitalist.  No history of as such, no pulmonary edema or CHF symptoms.  Echo pending.     Abnormal LFTs Improving.  Hypokalemia: Will be replenished before discharge.  Discharge plan was discussed with patient and/or family member and they verbalized understanding and agreed with it.  Discharge Diagnoses:  Principal Problem:   SIRS (systemic inflammatory response syndrome) (HCC) Active Problems:   Benign  essential HTN   GERD (gastroesophageal reflux disease)   Schizophrenia (HCC)   Type 2 diabetes mellitus (HCC)   Chronic obstructive pulmonary disease (HCC)   Congestive heart failure (HCC)   Hypokalemia   Hyponatremia   Hypocalcemia   Abnormal LFTs    Discharge Instructions   Allergies as of 02/01/2023       Reactions   Levofloxacin Hives, Other (See Comments)   Confusion, hives, and itching (patient has tolerated ciprofloxacin)   Chlorhexidine Itching, Rash        Medication List     TAKE these medications    acetaminophen 500 MG tablet Commonly known as: TYLENOL Take 500 mg by mouth every 8 (eight) hours as needed for moderate pain or mild pain.   albuterol 108 (90 Base) MCG/ACT inhaler Commonly known as: VENTOLIN HFA Inhale 1 puff into the lungs 2 (two) times daily as needed for wheezing or shortness of breath. What changed: how much to take   famotidine 20 MG tablet Commonly known as: PEPCID Take 20 mg by mouth 2 (two) times daily.   fluticasone 50 MCG/ACT nasal spray Commonly known as: FLONASE Place 1 spray into both nostrils 2 (two)  times daily as needed for allergies or rhinitis.   ibuprofen 800 MG tablet Commonly known as: ADVIL Take 800 mg by mouth 3 (three) times daily.   Hinda Glatter Trinza 819 MG/2.63ML injection Generic drug: paliperidone Palmitate ER Inject 819 mg into the muscle every 30 (thirty) days.   metoprolol succinate 50 MG 24 hr tablet Commonly known as: TOPROL-XL Take 1 tablet (50 mg total) by mouth daily. Take with or immediately following a meal. What changed: how much to take   ondansetron 4 MG tablet Commonly known as: ZOFRAN Take 4 mg by mouth 3 (three) times daily as needed for nausea or vomiting.   SUMAtriptan 50 MG tablet Commonly known as: IMITREX Take 50 mg by mouth every 2 (two) hours as needed for migraine.        Follow-up Information     Ganta, Anupa, DO Follow up in 1 week(s).   Specialty: Family  Medicine Contact information: [address] [place] [address] [phone]                Allergies  Allergen Reactions   Levofloxacin Hives and Other (See Comments)    Confusion, hives, and itching (patient has tolerated ciprofloxacin)   Chlorhexidine Itching and Rash    Consultations: None   Procedures/Studies: ECHOCARDIOGRAM COMPLETE  Result Date: 01/31/2023    ECHOCARDIOGRAM REPORT   Patient Name:   JAVOHN BASEY Date of Exam: 01/31/2023 Medical Rec #:  [id number]      Height:       69.0 in Accession #:    [id number]     Weight:       189.0 lb Date of Birth:  31-May-1965      BSA:          2.017 m Patient Age:    57 years       BP:           164/94 mmHg Patient Gender: M              HR:           73 bpm. Exam Location:  Inpatient Procedure: 2D Echo, Cardiac Doppler and Color Doppler Indications:    Bacteremia R78.81  History:        Patient has prior history of Echocardiogram examinations, most                 recent 07/28/2019. Stroke; Risk Factors:Hypertension and                 Dyslipidemia.  Sonographer:    Harriette Bouillon RDCS Referring Phys: [phone] DAVID MANUEL ORTIZ IMPRESSIONS  1. Left ventricular ejection fraction, by estimation, is 50 to 55%. The left ventricle has low normal function. The left ventricle has no regional wall motion abnormalities. Left ventricular diastolic parameters are consistent with Grade I diastolic dysfunction (impaired relaxation).  2. Right ventricular systolic function is normal. The right ventricular size is normal. Tricuspid regurgitation signal is inadequate for assessing PA pressure.  3. The mitral valve is grossly normal. Trivial mitral valve regurgitation.  4. The aortic valve is tricuspid. Aortic valve regurgitation is not visualized.  5. The inferior vena cava is normal in size with greater than 50% respiratory variability, suggesting right atrial pressure of 3 mmHg.  6. No obvious valvular vegetations. Comparison(s): Prior images  reviewed side by side. LVEF stable in 50-55% range. FINDINGS  Left Ventricle: Left ventricular ejection fraction, by estimation, is 50 to 55%. The left ventricle has low normal  function. The left ventricle has no regional wall motion abnormalities. The left ventricular internal cavity size was normal in size. There is borderline left ventricular hypertrophy. Left ventricular diastolic parameters are consistent with Grade I diastolic dysfunction (impaired relaxation). Right Ventricle: The right ventricular size is normal. No increase in right ventricular wall thickness. Right ventricular systolic function is normal. Tricuspid regurgitation signal is inadequate for assessing PA pressure. Left Atrium: Left atrial size was normal in size. Right Atrium: Right atrial size was normal in size. Pericardium: There is no evidence of pericardial effusion. Mitral Valve: The mitral valve is grossly normal. Trivial mitral valve regurgitation. Tricuspid Valve: The tricuspid valve is grossly normal. Tricuspid valve regurgitation is trivial. Aortic Valve: The aortic valve is tricuspid. There is mild aortic valve annular calcification. Aortic valve regurgitation is not visualized. Pulmonic Valve: The pulmonic valve was not well visualized. Pulmonic valve regurgitation is trivial. Aorta: The aortic root and ascending aorta are structurally normal, with no evidence of dilitation. Venous: The inferior vena cava is normal in size with greater than 50% respiratory variability, suggesting right atrial pressure of 3 mmHg. IAS/Shunts: No atrial level shunt detected by color flow Doppler.  LEFT VENTRICLE PLAX 2D LVIDd:         5.00 cm   Diastology LVIDs:         3.80 cm   LV e' medial:    5.22 cm/s LV PW:         1.00 cm   LV E/e' medial:  12.1 LV IVS:        1.00 cm   LV e' lateral:   5.00 cm/s LVOT diam:     2.40 cm   LV E/e' lateral: 12.6 LV SV:         73 LV SV Index:   36 LVOT Area:     4.52 cm  RIGHT VENTRICLE             IVC RV S  prime:     13.70 cm/s  IVC diam: 1.40 cm TAPSE (M-mode): 2.1 cm LEFT ATRIUM             Index        RIGHT ATRIUM           Index LA diam:        3.30 cm 1.64 cm/m   RA Area:     13.80 cm LA Vol (A2C):   33.9 ml 16.81 ml/m  RA Volume:   30.20 ml  14.97 ml/m LA Vol (A4C):   53.2 ml 26.38 ml/m LA Biplane Vol: 43.6 ml 21.62 ml/m  AORTIC VALVE LVOT Vmax:   72.40 cm/s LVOT Vmean:  48.000 cm/s LVOT VTI:    0.162 m  AORTA Ao Root diam: 3.80 cm Ao Asc diam:  3.70 cm MITRAL VALVE MV Area (PHT): 2.82 cm     SHUNTS MV Decel Time: 269 msec     Systemic VTI:  0.16 m MV E velocity: 63.10 cm/s   Systemic Diam: 2.40 cm MV A velocity: 111.00 cm/s MV E/A ratio:  0.57 Nona Dell MD Electronically signed by Nona Dell MD Signature Date/Time: 01/31/2023/12:14:00 PM    Final    CT Maxillofacial W Contrast  Result Date: 01/30/2023 CLINICAL DATA:  Fatigue, cold, chills, recent mouth surgery with current mouth pain. EXAM: CT MAXILLOFACIAL WITH CONTRAST TECHNIQUE: Multidetector CT imaging of the maxillofacial structures was performed with intravenous contrast. Multiplanar CT image reconstructions were also generated. RADIATION DOSE REDUCTION: This exam was  performed according to the departmental dose-optimization program which includes automated exposure control, adjustment of the mA and/or kV according to patient size and/or use of iterative reconstruction technique. CONTRAST:  75mL OMNIPAQUE IOHEXOL 300 MG/ML  SOLN COMPARISON:  None Available. FINDINGS: Osseous: There is no acute osseous abnormality or suspicious osseous lesion. There is no evidence of mandibular dislocation. There are multiple empty tooth sockets in the mandible which may reflect recent extraction. Orbits: The globes and orbits are unremarkable. Sinuses: There is mild mucosal thickening in the ethmoid air cells. Soft tissues: There is no soft tissue abscess. There is no swelling or abnormal enhancement in the oral cavity or oropharynx. Limited  intracranial: Unremarkable. IMPRESSION: No abnormal soft tissue swelling or abscess identified in the oral cavity/oropharynx. Electronically Signed   By: Lesia Hausen M.D.   On: 01/30/2023 18:22   DG Chest 2 View  Result Date: 01/30/2023 CLINICAL DATA:  Cough and fever.  Chills and fatigue. EXAM: CHEST - 2 VIEW COMPARISON:  01/06/2020 FINDINGS: The heart size and mediastinal contours are within normal limits. Both lungs are clear. Severe bilateral glenohumeral osteoarthritis noted. IMPRESSION: No active cardiopulmonary disease. Electronically Signed   By: Danae Orleans M.D.   On: 01/30/2023 16:23     Discharge Exam: Vitals:   02/01/23 0543 02/01/23 1224  BP: 106/83 (!) 138/97  Pulse: 69 69  Resp:  18  Temp: 98 F (36.7 C) 97.8 F (36.6 C)  SpO2: 99% 100%   Vitals:   01/31/23 1339 01/31/23 2004 02/01/23 0543 02/01/23 1224  BP: (!) 187/97 95/76 106/83 (!) 138/97  Pulse: 73 82 69 69  Resp: 14   18  Temp: 97.9 F (36.6 C) 99.1 F (37.3 C) 98 F (36.7 C) 97.8 F (36.6 C)  TempSrc: Oral Oral  Oral  SpO2: 100% 98% 99% 100%  Weight:      Height:        General: Pt is alert, awake, not in acute distress Cardiovascular: RRR, S1/S2 +, no rubs, no gallops Respiratory: CTA bilaterally, no wheezing, no rhonchi Abdominal: Soft, NT, ND, bowel sounds + Extremities: no edema, no cyanosis    The results of significant diagnostics from this hospitalization (including imaging, microbiology, ancillary and laboratory) are listed below for reference.     Microbiology: Recent Results (from the past 240 hour(s))  SARS Coronavirus 2 by RT PCR (hospital order, performed in [facility] hospital lab) *cepheid single result test* Anterior Nasal Swab     Status: None   Collection Time: 01/30/23 12:50 PM   Specimen: Anterior Nasal Swab  Result Value Ref Range Status   SARS Coronavirus 2 by RT PCR NEGATIVE NEGATIVE Final    Comment: (NOTE) SARS-CoV-2 target nucleic acids are NOT DETECTED.  The  SARS-CoV-2 RNA is generally detectable in upper and lower respiratory specimens during the acute phase of infection. The lowest concentration of SARS-CoV-2 viral copies this assay can detect is 250 copies / mL. A negative result does not preclude SARS-CoV-2 infection and should not be used as the sole basis for treatment or other patient management decisions.  A negative result may occur with improper specimen collection / handling, submission of specimen other than nasopharyngeal swab, presence of viral mutation(s) within the areas targeted by this assay, and inadequate number of viral copies (<250 copies / mL). A negative result must be combined with clinical observations, patient history, and epidemiological information.  Fact Sheet for Patients:   RoadLapTop.co.za  Fact Sheet for Healthcare Providers: http://kim-miller.com/  This  test is not yet approved or  cleared by the [place] and has been authorized for detection and/or diagnosis of SARS-CoV-2 by FDA under an Emergency Use Authorization (EUA).  This EUA will remain in effect (meaning this test can be used) for the duration of the COVID-19 declaration under Section 564(b)(1) of the Act, 21 U.S.C. section 360bbb-3(b)(1), unless the authorization is terminated or revoked sooner.  Performed at [facility], [address] [place], [place] [address]   Culture, blood (routine x 2)     Status: None (Preliminary result)   Collection Time: 01/30/23  4:06 PM   Specimen: BLOOD RIGHT ARM  Result Value Ref Range Status   Specimen Description   Final    BLOOD RIGHT ARM Performed at [facility], [address], [place], [place] [address]    Special Requests   Final    BOTTLES DRAWN AEROBIC AND ANAEROBIC Blood Culture results may not be optimal due to an excessive volume of blood received in culture bottles Performed at [facility], [address] [place], [place] [address]    Culture   Final    NO GROWTH < 12 HOURS Performed at [facility], [address] [place], [place] [address]    Report Status PENDING  Incomplete  Respiratory (~20 pathogens) panel by PCR     Status: None   Collection Time: 01/31/23 10:48 AM   Specimen: Nasopharyngeal Swab; Respiratory  Result Value Ref Range Status   Adenovirus NOT DETECTED NOT DETECTED Final   Coronavirus 229E NOT DETECTED NOT DETECTED Final    Comment: (NOTE) The Coronavirus on the Respiratory Panel, DOES NOT test for the novel  Coronavirus (2019 nCoV)    Coronavirus HKU1 NOT DETECTED NOT DETECTED Final   Coronavirus NL63 NOT DETECTED NOT DETECTED Final   Coronavirus OC43 NOT DETECTED NOT DETECTED Final   Metapneumovirus NOT DETECTED NOT DETECTED Final   Rhinovirus / Enterovirus NOT DETECTED NOT DETECTED Final   Influenza A NOT DETECTED NOT DETECTED Final   Influenza B NOT DETECTED NOT DETECTED Final   Parainfluenza Virus 1 NOT DETECTED NOT DETECTED Final   Parainfluenza Virus 2 NOT DETECTED NOT DETECTED Final   Parainfluenza Virus 3 NOT DETECTED NOT DETECTED Final   Parainfluenza Virus 4 NOT DETECTED NOT DETECTED Final   Respiratory Syncytial Virus NOT DETECTED NOT DETECTED Final   Bordetella pertussis NOT DETECTED NOT DETECTED Final   Bordetella Parapertussis NOT DETECTED NOT DETECTED Final   Chlamydophila pneumoniae NOT DETECTED NOT DETECTED Final   Mycoplasma pneumoniae NOT DETECTED NOT DETECTED Final    Comment: Performed at [facility], [address], [place], [place] [address]     Labs: BNP (last 3 results) No results for input(s): "BNP" in the last 8760 hours. Basic Metabolic Panel: Recent Labs  Lab 01/30/23 1249 01/30/23 1915 01/31/23 0552 02/01/23 0452  NA 134*  --  134* 134*  K 3.3*  --  4.1 3.4*  CL 102  --  109 104  CO2 22  --  19* 17*  GLUCOSE 105*  --  100* 128*  BUN 11  --  11 10  CREATININE 0.72  --  0.79  0.80  CALCIUM 8.6*  --  8.0* 8.6*  MG  --  1.9  --   --   PHOS  --  3.4  --   --    Liver Function Tests: Recent Labs  Lab 01/30/23 1606 01/31/23  1610 02/01/23 0452  AST 54* 25 18  ALT 22 14 12   ALKPHOS 165* 129* 119  BILITOT 0.8 0.7 0.8  PROT 7.7 6.8 6.8  ALBUMIN 4.2 3.5 3.5   No results for input(s): "LIPASE", "AMYLASE" in the last 168 hours. No results for input(s): "AMMONIA" in the last 168 hours. CBC: Recent Labs  Lab 01/30/23 1249 01/30/23 1606 01/31/23 0552 02/01/23 0452  WBC 21.2*  --  14.8* 10.7*  NEUTROABS  --  23.5*  --  8.8*  HGB 13.6  --  12.6* 13.8  HCT 41.5  --  39.4 44.1  MCV 87.7  --  90.0 92.5  PLT 260  --  267 247   Cardiac Enzymes: No results for input(s): "CKTOTAL", "CKMB", "CKMBINDEX", "TROPONINI" in the last 168 hours. BNP: Invalid input(s): "POCBNP" CBG: No results for input(s): "GLUCAP" in the last 168 hours. D-Dimer No results for input(s): "DDIMER" in the last 72 hours. Hgb A1c No results for input(s): "HGBA1C" in the last 72 hours. Lipid Profile No results for input(s): "CHOL", "HDL", "LDLCALC", "TRIG", "CHOLHDL", "LDLDIRECT" in the last 72 hours. Thyroid function studies No results for input(s): "TSH", "T4TOTAL", "T3FREE", "THYROIDAB" in the last 72 hours.  Invalid input(s): "FREET3" Anemia work up No results for input(s): "VITAMINB12", "FOLATE", "FERRITIN", "TIBC", "IRON", "RETICCTPCT" in the last 72 hours. Urinalysis    Component Value Date/Time   COLORURINE STRAW (A) 01/30/2023 1227   APPEARANCEUR CLEAR 01/30/2023 1227   LABSPEC 1.004 (L) 01/30/2023 1227   PHURINE 7.0 01/30/2023 1227   GLUCOSEU NEGATIVE 01/30/2023 1227   HGBUR NEGATIVE 01/30/2023 1227   BILIRUBINUR NEGATIVE 01/30/2023 1227   BILIRUBINUR negative 04/10/2016 1702   KETONESUR NEGATIVE 01/30/2023 1227   PROTEINUR NEGATIVE 01/30/2023 1227   UROBILINOGEN 0.2 04/10/2016 1702   UROBILINOGEN 0.2 12/04/2013 1044   NITRITE NEGATIVE 01/30/2023 1227   LEUKOCYTESUR  NEGATIVE 01/30/2023 1227   Sepsis Labs Recent Labs  Lab 01/30/23 1249 01/31/23 0552 02/01/23 0452  WBC 21.2* 14.8* 10.7*   Microbiology Recent Results (from the past 240 hour(s))  SARS Coronavirus 2 by RT PCR (hospital order, performed in [facility] hospital lab) *cepheid single result test* Anterior Nasal Swab     Status: None   Collection Time: 01/30/23 12:50 PM   Specimen: Anterior Nasal Swab  Result Value Ref Range Status   SARS Coronavirus 2 by RT PCR NEGATIVE NEGATIVE Final    Comment: (NOTE) SARS-CoV-2 target nucleic acids are NOT DETECTED.  The SARS-CoV-2 RNA is generally detectable in upper and lower respiratory specimens during the acute phase of infection. The lowest concentration of SARS-CoV-2 viral copies this assay can detect is 250 copies / mL. A negative result does not preclude SARS-CoV-2 infection and should not be used as the sole basis for treatment or other patient management decisions.  A negative result may occur with improper specimen collection / handling, submission of specimen other than nasopharyngeal swab, presence of viral mutation(s) within the areas targeted by this assay, and inadequate number of viral copies (<250 copies / mL). A negative result must be combined with clinical observations, patient history, and epidemiological information.  Fact Sheet for Patients:   RoadLapTop.co.za  Fact Sheet for Healthcare Providers: http://kim-miller.com/  This test is not yet approved or  cleared by the [place] FDA and has been authorized for detection and/or diagnosis of SARS-CoV-2 by FDA under an Emergency Use Authorization (EUA).  This EUA will remain in effect (meaning this test can be used) for the duration of  the COVID-19 declaration under Section 564(b)(1) of the Act, 21 U.S.C. section 360bbb-3(b)(1), unless the authorization is terminated or revoked sooner.  Performed at [facility], [address] [place], [place] [address]   Culture, blood (routine x 2)     Status: None (Preliminary result)   Collection Time: 01/30/23  4:06 PM   Specimen: BLOOD RIGHT ARM  Result Value Ref Range Status   Specimen Description   Final    BLOOD RIGHT ARM Performed at [facility], [address] [place], [place] [address]    Special Requests   Final    BOTTLES DRAWN AEROBIC AND ANAEROBIC Blood Culture results may not be optimal due to an excessive volume of blood received in culture bottles Performed at [facility], [address], [place], [place] [address]    Culture   Final    NO GROWTH < 12 HOURS Performed at [facility], [address] [place], [place] [address]    Report Status PENDING  Incomplete  Respiratory (~20 pathogens) panel by PCR     Status: None   Collection Time: 01/31/23 10:48 AM   Specimen: Nasopharyngeal Swab; Respiratory  Result Value Ref Range Status   Adenovirus NOT DETECTED NOT DETECTED Final   Coronavirus 229E NOT DETECTED NOT DETECTED Final    Comment: (NOTE) The Coronavirus on the Respiratory Panel, DOES NOT test for the novel  Coronavirus (2019 nCoV)    Coronavirus HKU1 NOT DETECTED NOT DETECTED Final   Coronavirus NL63 NOT DETECTED NOT DETECTED Final   Coronavirus OC43 NOT DETECTED NOT DETECTED Final   Metapneumovirus NOT DETECTED NOT DETECTED Final   Rhinovirus / Enterovirus NOT DETECTED NOT DETECTED Final   Influenza A NOT DETECTED NOT DETECTED Final   Influenza B NOT DETECTED NOT DETECTED Final   Parainfluenza Virus 1 NOT DETECTED NOT DETECTED Final   Parainfluenza Virus 2 NOT DETECTED NOT DETECTED Final   Parainfluenza Virus 3 NOT DETECTED NOT DETECTED Final   Parainfluenza Virus 4 NOT DETECTED NOT DETECTED Final   Respiratory Syncytial Virus NOT DETECTED NOT DETECTED Final   Bordetella pertussis NOT DETECTED NOT DETECTED Final   Bordetella Parapertussis NOT DETECTED NOT  DETECTED Final   Chlamydophila pneumoniae NOT DETECTED NOT DETECTED Final   Mycoplasma pneumoniae NOT DETECTED NOT DETECTED Final    Comment: Performed at [facility], [address], [place], [place] [address]    FURTHER DISCHARGE INSTRUCTIONS:   Get Medicines reviewed and adjusted: Please take all your medications with you for your next visit with your Primary MD   Laboratory/radiological data: Please request your Primary MD to go over all hospital tests and procedure/radiological results at the follow up, please ask your Primary MD to get all Hospital records sent to his/her office.   In some cases, they will be blood work, cultures and biopsy results pending at the time of your discharge. Please request that your primary care M.D. goes through all the records of your hospital data and follows up on these results.   Also Note the following: If you experience worsening of your admission symptoms, develop shortness of breath, life threatening emergency, suicidal or homicidal thoughts you must seek medical attention immediately by calling 911 or calling your MD immediately  if symptoms less severe.   You must read complete instructions/literature along with all the possible adverse reactions/side effects for all the Medicines you take and that have been prescribed to you. Take any new  Medicines after you have completely understood and accpet all the possible adverse reactions/side effects.    Do not drive when taking Pain medications or sleeping medications (Benzodaizepines)   Do not take more than prescribed Pain, Sleep and Anxiety Medications. It is not advisable to combine anxiety,sleep and pain medications without talking with your primary care practitioner   Special Instructions: If you have smoked or chewed Tobacco  in the last 2 yrs please stop smoking, stop any regular Alcohol  and or any Recreational drug use.   Wear Seat belts while driving.   Please note: You were  cared for by a hospitalist during your hospital stay. Once you are discharged, your primary care physician will handle any further medical issues. Please note that NO REFILLS for any discharge medications will be authorized once you are discharged, as it is imperative that you return to your primary care physician (or establish a relationship with a primary care physician if you do not have one) for your post hospital discharge needs so that they can reassess your need for medications and monitor your lab values  Time coordinating discharge: Over 30 minutes  SIGNED:   Hughie Closs, MD  [facility] 02/01/2023, 12:37 PM *Please note that this is a verbal dictation therefore any spelling or grammatical errors are due to the "Dragon Medical One" system interpretation. If 7PM-7AM, please contact night-coverage [email or web address]

## 2023-02-04 LAB — CULTURE, BLOOD (ROUTINE X 2): Culture: NO GROWTH

## 2023-02-05 LAB — CULTURE, BLOOD (ROUTINE X 2)
Culture: NO GROWTH
Special Requests: ADEQUATE

## 2023-06-24 DIAGNOSIS — I700 Atherosclerosis of aorta: Secondary | ICD-10-CM | POA: Diagnosis not present

## 2023-06-24 DIAGNOSIS — I110 Hypertensive heart disease with heart failure: Secondary | ICD-10-CM | POA: Diagnosis not present

## 2023-06-24 DIAGNOSIS — I5032 Chronic diastolic (congestive) heart failure: Secondary | ICD-10-CM | POA: Diagnosis not present

## 2023-06-24 DIAGNOSIS — Z1211 Encounter for screening for malignant neoplasm of colon: Secondary | ICD-10-CM | POA: Diagnosis not present

## 2023-06-24 DIAGNOSIS — M1711 Unilateral primary osteoarthritis, right knee: Secondary | ICD-10-CM | POA: Diagnosis not present

## 2023-07-12 ENCOUNTER — Encounter: Admitting: Orthopedic Surgery

## 2024-01-27 ENCOUNTER — Ambulatory Visit: Admitting: Podiatry

## 2024-02-03 ENCOUNTER — Ambulatory Visit: Admitting: Podiatry

## 2024-03-08 ENCOUNTER — Ambulatory Visit: Admitting: Podiatry

## 2024-03-27 ENCOUNTER — Other Ambulatory Visit: Payer: Self-pay

## 2024-03-27 ENCOUNTER — Emergency Department (HOSPITAL_COMMUNITY)
Admission: EM | Admit: 2024-03-27 | Discharge: 2024-03-27 | Disposition: A | Discharge status: Other Acute Inpatient Hospital | Attending: Emergency Medicine | Admitting: Emergency Medicine

## 2024-03-27 ENCOUNTER — Encounter: Payer: Self-pay | Admitting: Psychiatry

## 2024-03-27 ENCOUNTER — Encounter (HOSPITAL_COMMUNITY): Payer: Self-pay

## 2024-03-27 ENCOUNTER — Inpatient Hospital Stay
Admission: AD | Admit: 2024-03-27 | Discharge: 2024-04-03 | DRG: 885 | Disposition: A | Discharge status: Home / Self Care | Source: Intra-hospital | Attending: Psychiatry | Admitting: Psychiatry

## 2024-03-27 DIAGNOSIS — F1123 Opioid dependence with withdrawal: Secondary | ICD-10-CM | POA: Diagnosis not present

## 2024-03-27 DIAGNOSIS — R45850 Homicidal ideations: Secondary | ICD-10-CM | POA: Diagnosis present

## 2024-03-27 DIAGNOSIS — F1990 Other psychoactive substance use, unspecified, uncomplicated: Secondary | ICD-10-CM | POA: Diagnosis not present

## 2024-03-27 DIAGNOSIS — E119 Type 2 diabetes mellitus without complications: Secondary | ICD-10-CM | POA: Insufficient documentation

## 2024-03-27 DIAGNOSIS — Z23 Encounter for immunization: Secondary | ICD-10-CM | POA: Diagnosis present

## 2024-03-27 DIAGNOSIS — R4689 Other symptoms and signs involving appearance and behavior: Secondary | ICD-10-CM | POA: Insufficient documentation

## 2024-03-27 DIAGNOSIS — R45851 Suicidal ideations: Secondary | ICD-10-CM | POA: Insufficient documentation

## 2024-03-27 DIAGNOSIS — I10 Essential (primary) hypertension: Secondary | ICD-10-CM | POA: Diagnosis present

## 2024-03-27 DIAGNOSIS — F1590 Other stimulant use, unspecified, uncomplicated: Secondary | ICD-10-CM | POA: Diagnosis not present

## 2024-03-27 DIAGNOSIS — Z96651 Presence of right artificial knee joint: Secondary | ICD-10-CM | POA: Diagnosis present

## 2024-03-27 DIAGNOSIS — Z8673 Personal history of transient ischemic attack (TIA), and cerebral infarction without residual deficits: Secondary | ICD-10-CM

## 2024-03-27 DIAGNOSIS — F17210 Nicotine dependence, cigarettes, uncomplicated: Secondary | ICD-10-CM | POA: Diagnosis present

## 2024-03-27 DIAGNOSIS — Z79899 Other long term (current) drug therapy: Secondary | ICD-10-CM | POA: Insufficient documentation

## 2024-03-27 DIAGNOSIS — Z981 Arthrodesis status: Secondary | ICD-10-CM | POA: Diagnosis not present

## 2024-03-27 DIAGNOSIS — F4310 Post-traumatic stress disorder, unspecified: Secondary | ICD-10-CM | POA: Diagnosis present

## 2024-03-27 DIAGNOSIS — F329 Major depressive disorder, single episode, unspecified: Secondary | ICD-10-CM | POA: Diagnosis present

## 2024-03-27 DIAGNOSIS — F419 Anxiety disorder, unspecified: Secondary | ICD-10-CM | POA: Diagnosis present

## 2024-03-27 DIAGNOSIS — F19951 Other psychoactive substance use, unspecified with psychoactive substance-induced psychotic disorder with hallucinations: Secondary | ICD-10-CM | POA: Diagnosis present

## 2024-03-27 DIAGNOSIS — F1510 Other stimulant abuse, uncomplicated: Secondary | ICD-10-CM | POA: Diagnosis present

## 2024-03-27 DIAGNOSIS — E871 Hypo-osmolality and hyponatremia: Secondary | ICD-10-CM | POA: Insufficient documentation

## 2024-03-27 DIAGNOSIS — Z881 Allergy status to other antibiotic agents status: Secondary | ICD-10-CM | POA: Diagnosis not present

## 2024-03-27 DIAGNOSIS — Z8659 Personal history of other mental and behavioral disorders: Secondary | ICD-10-CM | POA: Diagnosis not present

## 2024-03-27 DIAGNOSIS — M19172 Post-traumatic osteoarthritis, left ankle and foot: Secondary | ICD-10-CM | POA: Diagnosis present

## 2024-03-27 DIAGNOSIS — Z888 Allergy status to other drugs, medicaments and biological substances status: Secondary | ICD-10-CM

## 2024-03-27 DIAGNOSIS — F1011 Alcohol abuse, in remission: Secondary | ICD-10-CM | POA: Diagnosis present

## 2024-03-27 DIAGNOSIS — Z5982 Transportation insecurity: Secondary | ICD-10-CM

## 2024-03-27 DIAGNOSIS — F209 Schizophrenia, unspecified: Secondary | ICD-10-CM | POA: Diagnosis not present

## 2024-03-27 DIAGNOSIS — F19959 Other psychoactive substance use, unspecified with psychoactive substance-induced psychotic disorder, unspecified: Secondary | ICD-10-CM | POA: Diagnosis not present

## 2024-03-27 DIAGNOSIS — F1193 Opioid use, unspecified with withdrawal: Secondary | ICD-10-CM

## 2024-03-27 DIAGNOSIS — F1490 Cocaine use, unspecified, uncomplicated: Secondary | ICD-10-CM | POA: Diagnosis not present

## 2024-03-27 DIAGNOSIS — F200 Paranoid schizophrenia: Principal | ICD-10-CM | POA: Diagnosis present

## 2024-03-27 LAB — CBC WITH DIFFERENTIAL/PLATELET
Abs Immature Granulocytes: 0.02 K/uL (ref 0.00–0.07)
Basophils Absolute: 0 K/uL (ref 0.0–0.1)
Basophils Relative: 1 %
Eosinophils Absolute: 0.2 K/uL (ref 0.0–0.5)
Eosinophils Relative: 3 %
HCT: 44.5 % (ref 39.0–52.0)
Hemoglobin: 14.2 g/dL (ref 13.0–17.0)
Immature Granulocytes: 0 %
Lymphocytes Relative: 22 %
Lymphs Abs: 1.4 K/uL (ref 0.7–4.0)
MCH: 28.6 pg (ref 26.0–34.0)
MCHC: 31.9 g/dL (ref 30.0–36.0)
MCV: 89.5 fL (ref 80.0–100.0)
Monocytes Absolute: 0.3 K/uL (ref 0.1–1.0)
Monocytes Relative: 4 %
Neutro Abs: 4.5 K/uL (ref 1.7–7.7)
Neutrophils Relative %: 70 %
Platelets: 214 K/uL (ref 150–400)
RBC: 4.97 MIL/uL (ref 4.22–5.81)
RDW: 14.6 % (ref 11.5–15.5)
WBC: 6.4 K/uL (ref 4.0–10.5)
nRBC: 0 % (ref 0.0–0.2)

## 2024-03-27 LAB — URINE DRUG SCREEN
Amphetamines: NEGATIVE
Barbiturates: NEGATIVE
Benzodiazepines: NEGATIVE
Cocaine: POSITIVE — AB
Fentanyl: POSITIVE — AB
Methadone Scn, Ur: NEGATIVE
Opiates: NEGATIVE
Tetrahydrocannabinol: NEGATIVE

## 2024-03-27 LAB — COMPREHENSIVE METABOLIC PANEL WITH GFR
ALT: 6 U/L (ref 0–44)
AST: 34 U/L (ref 15–41)
Albumin: 4.5 g/dL (ref 3.5–5.0)
Alkaline Phosphatase: 147 U/L — ABNORMAL HIGH (ref 38–126)
Anion gap: 14 (ref 5–15)
BUN: 10 mg/dL (ref 6–20)
CO2: 22 mmol/L (ref 22–32)
Calcium: 9.4 mg/dL (ref 8.9–10.3)
Chloride: 98 mmol/L (ref 98–111)
Creatinine, Ser: 0.72 mg/dL (ref 0.61–1.24)
GFR, Estimated: >60 mL/min
Glucose, Bld: 114 mg/dL — ABNORMAL HIGH (ref 70–99)
Potassium: 3.6 mmol/L (ref 3.5–5.1)
Sodium: 133 mmol/L — ABNORMAL LOW (ref 135–145)
Total Bilirubin: 0.3 mg/dL (ref 0.0–1.2)
Total Protein: 7.6 g/dL (ref 6.5–8.1)

## 2024-03-27 LAB — ETHANOL: Alcohol, Ethyl (B): <15 mg/dL

## 2024-03-27 MED ORDER — LORAZEPAM 1 MG PO TABS
1.0000 mg | ORAL_TABLET | Freq: Once | ORAL | Status: AC
  Administered 2024-03-27: 1 mg via ORAL
  Filled 2024-03-27: qty 1

## 2024-03-27 MED ORDER — LORAZEPAM 1 MG PO TABS
1.0000 mg | ORAL_TABLET | Freq: Two times a day (BID) | ORAL | Status: DC | PRN
  Administered 2024-03-27: 1 mg via ORAL
  Filled 2024-03-27: qty 1

## 2024-03-27 MED ORDER — ONDANSETRON HCL 4 MG PO TABS
4.0000 mg | ORAL_TABLET | Freq: Three times a day (TID) | ORAL | Status: DC | PRN
  Administered 2024-03-28: 4 mg via ORAL
  Filled 2024-03-27 (×2): qty 1

## 2024-03-27 MED ORDER — LORAZEPAM 2 MG/ML IJ SOLN
2.0000 mg | Freq: Three times a day (TID) | INTRAMUSCULAR | Status: AC | PRN
  Administered 2024-03-29: 2 mg via INTRAMUSCULAR

## 2024-03-27 MED ORDER — HALOPERIDOL LACTATE 5 MG/ML IJ SOLN: 5.0000 mg | Freq: Three times a day (TID) | INTRAMUSCULAR | Status: AC | PRN

## 2024-03-27 MED ORDER — FAMOTIDINE 20 MG PO TABS
20.0000 mg | ORAL_TABLET | Freq: Two times a day (BID) | ORAL | Status: DC
  Administered 2024-03-27 – 2024-04-03 (×13): 20 mg via ORAL
  Filled 2024-03-27 (×13): qty 1

## 2024-03-27 MED ORDER — ACETAMINOPHEN 325 MG PO TABS
650.0000 mg | ORAL_TABLET | Freq: Four times a day (QID) | ORAL | Status: AC | PRN
  Administered 2024-03-28 – 2024-04-03 (×5): 650 mg via ORAL
  Filled 2024-03-27 (×5): qty 2

## 2024-03-27 MED ORDER — LORAZEPAM 2 MG/ML IJ SOLN
2.0000 mg | Freq: Three times a day (TID) | INTRAMUSCULAR | Status: AC | PRN
  Filled 2024-03-27: qty 1

## 2024-03-27 MED ORDER — HYDROXYZINE HCL 25 MG PO TABS
25.0000 mg | ORAL_TABLET | Freq: Three times a day (TID) | ORAL | Status: DC | PRN
  Administered 2024-03-31: 25 mg via ORAL
  Filled 2024-03-27: qty 1

## 2024-03-27 MED ORDER — ALUM & MAG HYDROXIDE-SIMETH 200-200-20 MG/5ML PO SUSP
30.0000 mL | ORAL | Status: AC | PRN
  Administered 2024-03-28: 30 mL via ORAL
  Filled 2024-03-27: qty 30

## 2024-03-27 MED ORDER — TRAZODONE HCL 50 MG PO TABS
50.0000 mg | ORAL_TABLET | Freq: Every evening | ORAL | Status: DC | PRN
  Administered 2024-03-27 – 2024-03-28 (×2): 50 mg via ORAL
  Filled 2024-03-27 (×2): qty 1

## 2024-03-27 MED ORDER — MAGNESIUM HYDROXIDE 400 MG/5ML PO SUSP: 30.0000 mL | Freq: Every day | ORAL | Status: DC | PRN

## 2024-03-27 MED ORDER — DIPHENHYDRAMINE HCL 50 MG/ML IJ SOLN: 50.0000 mg | Freq: Three times a day (TID) | INTRAMUSCULAR | Status: AC | PRN

## 2024-03-27 MED ORDER — NICOTINE POLACRILEX 2 MG MT GUM: 2.0000 mg | CHEWING_GUM | OROMUCOSAL | Status: DC | PRN

## 2024-03-27 MED ORDER — HALOPERIDOL LACTATE 5 MG/ML IJ SOLN: 10.0000 mg | Freq: Three times a day (TID) | INTRAMUSCULAR | Status: AC | PRN

## 2024-03-27 MED ORDER — NICOTINE 21 MG/24HR TD PT24
21.0000 mg | MEDICATED_PATCH | Freq: Once | TRANSDERMAL | Status: DC
  Administered 2024-03-27: 21 mg via TRANSDERMAL
  Filled 2024-03-27: qty 1

## 2024-03-27 MED ORDER — DIPHENHYDRAMINE HCL 25 MG PO CAPS
50.0000 mg | ORAL_CAPSULE | Freq: Three times a day (TID) | ORAL | Status: DC | PRN
  Administered 2024-03-27 – 2024-03-31 (×3): 50 mg via ORAL
  Filled 2024-03-27 (×3): qty 2

## 2024-03-27 MED ORDER — METOPROLOL SUCCINATE ER 25 MG PO TB24
50.0000 mg | ORAL_TABLET | Freq: Every day | ORAL | Status: DC
  Administered 2024-03-27 – 2024-03-29 (×3): 50 mg via ORAL
  Filled 2024-03-27 (×3): qty 2

## 2024-03-27 MED ORDER — NICOTINE 14 MG/24HR TD PT24
14.0000 mg | MEDICATED_PATCH | Freq: Every day | TRANSDERMAL | Status: DC
  Administered 2024-03-28 – 2024-04-03 (×7): 14 mg via TRANSDERMAL
  Filled 2024-03-27 (×7): qty 1

## 2024-03-27 MED ORDER — HALOPERIDOL 5 MG PO TABS
5.0000 mg | ORAL_TABLET | Freq: Three times a day (TID) | ORAL | Status: DC | PRN
  Administered 2024-03-27 – 2024-03-29 (×2): 5 mg via ORAL
  Filled 2024-03-27 (×2): qty 1

## 2024-03-27 MED ORDER — HYDROXYZINE HCL 25 MG PO TABS
50.0000 mg | ORAL_TABLET | Freq: Three times a day (TID) | ORAL | Status: DC | PRN
  Administered 2024-03-27: 50 mg via ORAL
  Filled 2024-03-27: qty 2

## 2024-03-27 NOTE — Progress Notes (Signed)
 Patient ID: [name], male   DOB: 15-Mar-1966, 58 y.o.   MRN: [id number]  1655 Pt arrived on the Unit with an unsteady gait using a cane. Pt is IVC. Pt is A/O x4 with mild confusion and Hx: Schizophrenia, Poly-substance (Coc, Heroin) last used 5-7 days ago. Medical Hx: HTN, DM with no noted medication/ or accu checks. Meds whole. Food and fluid offered. 25% of meal intake and 100% fluid intake. ADL's self. Skin assessed. Noted tattoos to bilat UE. Excessive dry skin to bilat feet with hardened large toe nails. When asked why was Pt admitted, Pt said; I need help. I need a shot. My Invega . I just want to go and lay down. I'm cold. Pt denied SI/HI; A/V/H. Mood: Sleepy. Pt appeared anxious, fidgety and paced once on the unit using the phone. No noted distress. Passing on care to on-coming nurse for 7p-7a shift.

## 2024-03-27 NOTE — Group Note (Signed)
 Date:  03/27/2024 Time:  8:38 PM  Group Topic/Focus:  Wrap-Up Group:   The focus of this group is to help patients review their daily goal of treatment and discuss progress on daily workbooks.    Participation Level:  Did Not Attend  Participation Quality:  none  Affect:  none  Cognitive:  none  Insight: None  Engagement in Group:  None  Modes of Intervention:  none  Additional Comments:    Ginny JONETTA Galeazzi 03/27/2024, 8:38 PM

## 2024-03-27 NOTE — Tx Team (Signed)
 Initial Treatment Plan 03/27/2024 6:00 PM [name] FMW:[id number]    PATIENT STRESSORS: Financial difficulties   Health problems   Medication change or noncompliance   Substance abuse     PATIENT STRENGTHS: Active sense of humor  Supportive family/friends    PATIENT IDENTIFIED PROBLEMS: Pt has Hx of Schizophrenia and Poly-substance use. Currently, Pt is yelling asking to be D/C. Pt has Hx: Poly-substance (Coc, & Heroin) Last used x 5-7 days ago.                     DISCHARGE CRITERIA:  Ability to meet basic life and health needs Improved stabilization in mood, thinking, and/or behavior Medical problems require only outpatient monitoring Safe-care adequate arrangements made  PRELIMINARY DISCHARGE PLAN: Attend PHP/IOP Outpatient therapy Return to previous living arrangement  PATIENT/FAMILY INVOLVEMENT: This treatment plan has been presented to and reviewed with the patient, [name].  The patient and family have been given the opportunity to ask questions and make suggestions.  Bronwyn ONEIDA Sharps, RN 03/27/2024, 6:00 PM

## 2024-03-27 NOTE — ED Notes (Addendum)
 [facility] called pts significant other, Clinical Cytogeneticist for collateral information. Per pts SO, pt intermittently reports SI. Pts SO said that she is not in his head so she does not understand what he is thinking. Pts SO reports that pt has severe schizophrenia and just needs his Invega  shot. Pts SO reports pt is a good person and does much better when he is taking his injection. Pts SO believes that pt is overdue for his injection. Last night pt was speaking with Chrissy from his ACTT but the SO is unsure where the team is from. Pt was advised to come to Behavioral Health to get his injection. Pts SO expressed frustration over pt not getting his injection and would like to be informed of pts disposition. Pts SO provided the phone number for pts ACTT.  [facility] called pts SO to inform her that pt is being IVC'd and will be admitted inpatient. Pts SO reports that pt received his injection early last time at the end of November so she believes he is overdue. [facility] explained that we are going based on what pts ACTT reported and that per the team, pt is not due until January 12th and reported SI and HI towards his SO. [facility] asked pts SO what threats were made by pt towards her and she said that she doesn't take it seriously. [facility] explained that both his SI and HI are being taken seriously which is why pt is being admitted. Pt encouraged pts SO to follow up with the discharge social worker when pt gets discharged to have pt follow up with his ACTT.   Chesley Holt, [facility]  03/27/24

## 2024-03-27 NOTE — BH Assessment (Signed)
 Comprehensive Clinical Assessment (CCA) Note   03/27/2024 [name] [id number]  Disposition: Per Roxianne Olp, NP,  patient is recommended for inpatient admission. Disposition SW to pursue appropriate inpatient options.  The patient demonstrates the following risk factors for suicide: Chronic risk factors for suicide include: psychiatric disorder of Schizophrenia. Acute risk factors for suicide include: unemployment and social withdrawal/isolation. Protective factors for this patient include: positive social support. Considering these factors, the overall suicide risk at this point appears to be low. Patient is not appropriate for outpatient follow up.   Patient is a 58 year old male with a history of Schizophrenia who presents voluntarily to [facility] ED for an assessment. Patient resides in the home with girlfriend and identifies her as their primary support system.Patient reports isolation, crying spells, irritability, hopelessness, guilt, loss of interest to do things they enjoy, fatigue, lack of concentration, worthlessness, change in sleep, change in appetite. Pt reports hearing voices telling him to harm himself and others. Pt denies a HI plan but states a plan to shoot self. Patient has a hx of Substance Abuse: cocaine and fentanyl  Last use is unknown. .Patient reports NSSIB, SI, HI, AVH.  Patient identifies his primary stressors are his mental health and medication. Patient denies history of abuse or trauma. Patient denies current legal problems. Patient is receiving outpatient therapy and psychiatry services as part of an ACTT.  Patient reports he  takes his medications as prescribed (see MAR) and denies recent medication changes. Pt reports that he has missed his scheduled injection for this month. [facility].  Patient denies access to weapons.   During evaluation pt is in no acute distress. He is alert, oriented x  4, calm, cooperative and attentive. his mood is anxious, closed / guarded, and depressed with congruent affect. He has normal speech, and behavior.  Objectively there is no evidence of psychosis/mania or delusional thinking.  Patient is able to converse coherently, goal directed thoughts, no distractibility, or pre-occupation.   He also denies suicidal/self-harm/homicidal ideation, psychosis, and paranoia.  Patient answered question appropriately.      Chief Complaint:  Chief Complaint  Patient presents with   Suicidal   Visit Diagnosis: Schizophrenia     CCA Screening, Triage and Referral (STR)  Patient Reported Information How did you hear about us ? -- ([facility] ED)  What Is the Reason for Your Visit/Call Today? Per EDP's note: [name] is a 58 y.o. male who presents with concern for mental breakdown over the last few weeks.  Reports having SI with plan to shoot himself, also has schizophrenia with uncontrolled AVH on Invega  injections monthly.  States that he had his the first of this month.  Denies HI, calm cooperative.  Also has history of lumbar spinal stenosis, hypertension, CVA, type 2 diabetes.  He is not anticoagulated.       How Long Has This Been Causing You Problems? 1 wk - 1 month  What Do You Feel Would Help You the Most Today? Treatment for Depression or other mood problem; Stress Management; Medication(s)   Have You Recently Had Any Thoughts About Hurting Yourself? Yes  Are You Planning to Commit Suicide/Harm Yourself At This time? No   Flowsheet Row ED from 03/27/2024 in [facility] Emergency Department at [facility] ED to Hosp-Admission (Discharged) from 01/30/2023 in [facility] Admission (Discharged) from 01/01/2023 in [place] PERIOPERATIVE AREA  C-SSRS  RISK CATEGORY High Risk No Risk No Risk    Have you Recently Had Thoughts About Hurting Someone Sherral? Yes  Are You Planning to Harm Someone at This Time?  No  Explanation: Per pt:  all the time  due to hearing voices telling him to harm other. Pt denies a specific person and reports no plan.   Have You Used Any Alcohol  or Drugs in the Past 24 Hours? No  How Long Ago Did You Use Drugs or Alcohol ? unknown What Did You Use and How Much? unknown  Do You Currently Have a Therapist/Psychiatrist? Yes  Name of Therapist/Psychiatrist: Name of Therapist/Psychiatrist: ACT team unable to recall agency's name   Have You Been Recently Discharged From Any Office Practice or Programs? No  Explanation of Discharge From Practice/Program: unknown    CCA Screening Triage Referral Assessment Type of Contact: Tele-Assessment  Telemedicine Service Delivery: Telemedicine service delivery: This service was provided via telemedicine using a 2-way, interactive audio and video technology  Is this Initial or Reassessment? Is this Initial or Reassessment?: Initial Assessment  Date Telepsych consult ordered in [facility]:  Date Telepsych consult ordered in [facility]: 03/27/24  Time Telepsych consult ordered in [facility]:  Time Telepsych consult ordered in [facility]: 0218  Location of Assessment: [facility] ED  Provider Location: [facility] Assessment Services   Collateral Involvement: none   Does Patient Have a Automotive Engineer Guardian? No  Legal Guardian Contact Information: n/a  Copy of Legal Guardianship Form: -- (n/a)  Legal Guardian Notified of Arrival: -- (n/a)  Legal Guardian Notified of Pending Discharge: -- (n/a)  If Minor and Not Living with Parent(s), Who has Custody? n/a  Is CPS involved or ever been involved? Never  Is APS involved or ever been involved? Never   Patient Determined To Be At Risk for Harm To Self or Others Based on Review of Patient Reported Information or Presenting Complaint? Yes, for Self-Harm  Method: Plan without intent (shoot self)  Availability of Means: No access or NA  Intent: Vague intent or NA  Notification Required: No  need or identified person  Additional Information for Danger to Others Potential: Active psychosis  Additional Comments for Danger to Others Potential: Pt reports hearing voices telling him to hurt himself and others.  Are There Guns or Other Weapons in Your Home? No  Types of Guns/Weapons: Denies acess  Are These Weapons Safely Secured?                            No  Who Could Verify You Are Able To Have These Secured: Denies access  Do You Have any Outstanding Charges, Pending Court Dates, Parole/Probation? Denies pending legal charges  Contacted To Inform of Risk of Harm To Self or Others: -- (n/a)    Does Patient Present under Involuntary Commitment? No    [place] of Residence: [place]   Patient Currently Receiving the Following Services: ACTT Psychologist, Educational); Medication Management   Determination of Need: Urgent (48 hours)   Options For Referral: Inpatient Hospitalization     CCA Biopsychosocial Patient Reported Schizophrenia/Schizoaffective Diagnosis in Past: Yes   Strengths: None reported   Mental Health Symptoms Depression:  Difficulty Concentrating; Sleep (too much or little); Hopelessness; Worthlessness   Duration of Depressive symptoms: Duration of Depressive Symptoms: Greater than two weeks   Mania:  None   Anxiety:   Difficulty concentrating; Tension; Worrying; Fatigue   Psychosis:  Hallucinations   Duration of  Psychotic symptoms: Duration of Psychotic Symptoms: Greater than six months   Trauma:  None   Obsessions:  None   Compulsions:  None   Inattention:  None   Hyperactivity/Impulsivity:  N/A   Oppositional/Defiant Behaviors:  None   Emotional Irregularity:  None   Other Mood/Personality Symptoms:  n/a    Mental Status Exam Appearance and self-care  Stature:  Average   Weight:  Average weight   Clothing:  Dirty; Disheveled   Grooming:  Neglected   Cosmetic use:  None   Posture/gait:  Stooped (Has  right knee replacement.)   Motor activity:  Not Remarkable; Slowed   Sensorium  Attention:  Normal   Concentration:  Normal   Orientation:  Situation; Place; Person; Object   Recall/memory:  Normal   Affect and Mood  Affect:  Congruent   Mood:  Depressed   Relating  Eye contact:  Fleeting   Facial expression:  Depressed   Attitude toward examiner:  Cooperative   Thought and Language  Speech flow: Clear and Coherent   Thought content:  Appropriate to Mood and Circumstances   Preoccupation:  normal  Hallucinations:  Auditory   Organization:  Patent Examiner of Knowledge:  Fair   Intelligence:  Average   Abstraction:  Normal   Judgement:  Fair   Brewing Technologist   Insight:  Fair   Decision Making:  Normal   Social Functioning  Social Maturity:  Impulsive   Social Judgement:  Normal   Stress  Stressors:  Other (Comment) (Medication)   Coping Ability:  Overwhelmed   Skill Deficits:  Communication; Decision making   Supports:  Family     Religion: Religion/Spirituality Are You A Religious Person?: No How Might This Affect Treatment?: n/a  Leisure/Recreation: Leisure / Recreation Do You Have Hobbies?: No  Exercise/Diet: Exercise/Diet Do You Exercise?: No Have You Gained or Lost A Significant Amount of Weight in the Past Six Months?: No Do You Follow a Special Diet?: No Do You Have Any Trouble Sleeping?: Yes Explanation of Sleeping Difficulties: Pt reports difficulty remaining sleep.   CCA Employment/Education Employment/Work Situation: Employment / Work Systems Developer: On disability Why is Patient on Disability: Mental health How Long has Patient Been on Disability: unknown Patient's Job has Been Impacted by Current Illness: No Has Patient ever Been in the [employer]?: No  Education: Education Is Patient Currently Attending School?: No Last Grade Completed: 12 Did You Attend  College?: No Did You Have An Individualized Education Program (IIEP): No Did You Have Any Difficulty At School?: No Patient's Education Has Been Impacted by Current Illness: No   CCA Family/Childhood History Family and Relationship History: Family history Marital status: Single Does patient have children?: Yes How many children?:  (UTA) How is patient's relationship with their children?: UTA  Childhood History:  Childhood History By whom was/is the patient raised?: Both parents Did patient suffer any verbal/emotional/physical/sexual abuse as a child?: No Did patient suffer from severe childhood neglect?: No Has patient ever been sexually abused/assaulted/raped as an adolescent or adult?: No Was the patient ever a victim of a crime or a disaster?: No Witnessed domestic violence?: No Has patient been affected by domestic violence as an adult?: No       CCA Substance Use Alcohol /Drug Use: Alcohol  / Drug Use Pain Medications: See MAR Prescriptions: See MAR Over the Counter: See MAR History of alcohol  / drug use?: Yes Longest period of sobriety (when/how long): Per pt's chart,  pt was positive for cocaine and fentanyl  Negative Consequences of Use:  (unknown) Withdrawal Symptoms: None                         ASAM's:  Six Dimensions of Multidimensional Assessment  Dimension 1:  Acute Intoxication and/or Withdrawal Potential:      Dimension 2:  Biomedical Conditions and Complications:      Dimension 3:  Emotional, Behavioral, or Cognitive Conditions and Complications:     Dimension 4:  Readiness to Change:     Dimension 5:  Relapse, Continued use, or Continued Problem Potential:     Dimension 6:  Recovery/Living Environment:     ASAM Severity Score:    ASAM Recommended Level of Treatment: ASAM Recommended Level of Treatment: Level II Intensive Outpatient Treatment   Substance use Disorder (SUD) Substance Use Disorder (SUD)  Checklist Symptoms of Substance Use:  Continued use despite having a persistent/recurrent physical/psychological problem caused/exacerbated by use, Continued use despite persistent or recurrent social, interpersonal problems, caused or exacerbated by use  Recommendations for Services/Supports/Treatments: Recommendations for Services/Supports/Treatments Recommendations For Services/Supports/Treatments: Inpatient Hospitalization  Disposition Recommendation per psychiatric provider: We recommend inpatient psychiatric hospitalization when medically cleared. Patient is under voluntary admission status at this time; please IVC if attempts to leave hospital.   DSM5 Diagnoses: Patient Active Problem List   Diagnosis Date Noted   Hypokalemia 01/30/2023   Hyponatremia 01/30/2023   Hypocalcemia 01/30/2023   Abnormal LFTs 01/30/2023   Discitis 12/16/2020   Medication monitoring encounter 12/16/2020   Epidural abscess 11/03/2020   Anxiety 07/22/2020   Asthma 07/22/2020   Chronic obstructive pulmonary disease (HCC) 07/22/2020   Congestive heart failure (HCC) 07/22/2020   Hepatitis C antibody test positive 07/22/2020   Depression 07/22/2020   Nondependent alcohol  abuse, in remission 07/22/2020   Ankle ankylosis, left    Closed fracture of one rib of left side    Discitis of lumbar region    Osteomyelitis (HCC) 01/07/2020   Acute osteomyelitis of lumbar spine (HCC) 08/16/2019   Altered mental status    Delirium    Toxic metabolic encephalopathy 07/26/2019   Polysubstance overdose 07/26/2019   Acute kidney injury 07/26/2019   Chronic pain syndrome 07/26/2019   SIRS (systemic inflammatory response syndrome) (HCC) 07/26/2019   Spinal stenosis, lumbar region, with neurogenic claudication 04/11/2019    Class: Chronic   Lumbar disc herniation 04/11/2019    Class: Chronic   Status post lumbar spinal fusion 04/11/2019   Hardware complicating wound infection 01/31/2018   Spondylolisthesis of lumbar region 12/30/2017   S/P ankle  fusion 10/20/2017   Post-traumatic osteoarthritis, left ankle and foot 01/07/2017   Primary osteoarthritis of right knee 05/27/2016   GERD (gastroesophageal reflux disease) 07/09/2015   Mild persistent asthma 07/09/2015   Type 2 diabetes mellitus (HCC) 07/09/2015   Polyp of duodenum 06/04/2015   Alcohol  dependence (HCC) 02/25/2015   Moderate episode of recurrent major depressive disorder (HCC) 02/25/2015   Upper GI bleed 02/18/2015   Benign essential HTN 02/18/2015   Anemia of chronic disease 02/18/2015   Pulmonary nodule 02/18/2015   Cocaine abuse (HCC) 03/02/2014   Posttraumatic stress disorder 03/02/2014   History of CVA (cerebrovascular accident) 01/12/2014   Schizophrenia (HCC) 01/12/2014   Nicotine  dependence, cigarettes, uncomplicated 12/06/2013     Referrals to Alternative Service(s): Referred to Alternative Service(s):   Place:   Date:   Time:    Referred to Alternative Service(s):   Place:   Date:  Time:    Referred to Alternative Service(s):   Place:   Date:   Time:    Referred to Alternative Service(s):   Place:   Date:   Time:     Rosina PARAS, [place], [facility]

## 2024-03-27 NOTE — Progress Notes (Addendum)
 Pt was accepted to [facility] BMU TODAY (03/27/2024, Bed assignment: 325  Pt meets inpatient criteria per: Roxianne Olp NP  Attending Physician will be Donnelly MD  Report can be called to: [phone]  Pt can arrive after: [facility] WILL UPDATE   Care Team Notified: Cherylynn Ernst RN, Cathaleen Mangrum NP  [place] Dannon Nguyenthi LCSW-A   03/27/2024 12:01 PM

## 2024-03-27 NOTE — ED Notes (Signed)
 Pt has been changed into purple scrubs and belongings have been placed under the cabinets behind the nurses station. 1 bag

## 2024-03-27 NOTE — ED Provider Notes (Signed)
 " [name]   CSN: [id number] Arrival date & time: 03/27/24  9953     Patient presents with: Suicidal   [name] is a 57 y.o. male who presents with concern for mental breakdown over the last few weeks.  Reports having SI with plan to shoot himself, also has schizophrenia with uncontrolled AVH on Invega  injections monthly.  States that he had his the first of this month.  Denies HI, calm cooperative.  Also has history of lumbar spinal stenosis, hypertension, CVA, type 2 diabetes.  He is not anticoagulated.   HPI     Prior to Admission medications  Medication Sig Start Date End Date Taking? Authorizing Provider  acetaminophen  (TYLENOL ) 500 MG tablet Take 500 mg by mouth every 8 (eight) hours as needed for moderate pain or mild pain.    [provider]  albuterol  (PROVENTIL  HFA;VENTOLIN  HFA) 108 (90 Base) MCG/ACT inhaler Inhale 1 puff into the lungs 2 (two) times daily as needed for wheezing or shortness of breath. Patient taking differently: Inhale 2 puffs into the lungs 2 (two) times daily as needed for wheezing or shortness of breath. 05/14/17   Diallo, Irving, MD  famotidine  (PEPCID ) 20 MG tablet Take 20 mg by mouth 2 (two) times daily. 12/10/22   [provider]  fluticasone  (FLONASE ) 50 MCG/ACT nasal spray Place 1 spray into both nostrils 2 (two) times daily as needed for allergies or rhinitis. 12/16/22   [provider]  ibuprofen  (ADVIL ) 800 MG tablet Take 800 mg by mouth 3 (three) times daily.    [provider]  INVEGA  TRINZA 819 MG/2.63ML SUSY injection Inject 819 mg into the muscle every 30 (thirty) days.    [provider]  metoprolol  succinate (TOPROL -XL) 50 MG 24 hr tablet Take 1 tablet (50 mg total) by mouth daily. Take with or immediately following a meal. Patient taking differently: Take 25 mg by mouth daily. Take with or immediately following a meal. 10/17/21  01/30/23  Cardama, Raynell Moder, MD  ondansetron  (ZOFRAN ) 4 MG tablet Take 4 mg by mouth 3 (three) times daily as needed for nausea or vomiting. 08/25/22   [provider]  SUMAtriptan (IMITREX) 50 MG tablet Take 50 mg by mouth every 2 (two) hours as needed for migraine. 12/16/22   [provider]    Allergies: Levofloxacin  and Chlorhexidine     Review of Systems  Psychiatric/Behavioral:  Positive for hallucinations and suicidal ideas.     Updated Vital Signs BP (!) 182/106 (BP Location: Right Arm)   Pulse 74   Temp 97.8 F (36.6 C) (Oral)   Resp 18   SpO2 100%   Physical Exam Vitals and nursing [name] reviewed.  Constitutional:      Appearance: He is obese. He is not ill-appearing, toxic-appearing or diaphoretic.  HENT:     Head: Normocephalic and atraumatic.  Eyes:     General: No scleral icterus.       Right eye: No discharge.        Left eye: No discharge.     Conjunctiva/sclera: Conjunctivae normal.  Cardiovascular:     Heart sounds: Normal heart sounds. No murmur heard. Pulmonary:     Effort: Pulmonary effort is normal.  Abdominal:     Palpations: Abdomen is soft.  Skin:    General: Skin is warm and dry.     Capillary Refill: Capillary refill takes less than 2 seconds.  Neurological:  General: No focal deficit present.     Mental Status: He is alert and oriented to person, place, and time.  Psychiatric:        Mood and Affect: Mood normal. Affect is flat.        Behavior: Behavior is withdrawn. Behavior is cooperative.        Thought Content: Thought content includes suicidal ideation. Thought content does not include homicidal ideation. Thought content includes suicidal plan.     Comments: Does not appear to be responding to internal stimuli at time of my evaluation.     (all labs ordered are listed, but only abnormal results are displayed) Labs Reviewed  CBC WITH DIFFERENTIAL/PLATELET  COMPREHENSIVE METABOLIC PANEL WITH GFR  ETHANOL   URINE DRUG SCREEN    EKG: EKG Interpretation Date/Time:  Monday March 27 2024 03:00:19 EST Ventricular Rate:  91 PR Interval:  139 QRS Duration:  88 QT Interval:  388 QTC Calculation: 478 R Axis:   25  Text Interpretation: Sinus rhythm Abnormal R-wave progression, early transition Minimal ST depression, diffuse leads No significant change since last tracing Confirmed by Emil Share [phone]) on 03/27/2024 3:05:06 AM  Radiology: No results found.   Procedures   Medications Ordered in the ED - No data to display                                  Medical Decision Making 58 year old male with monthly injections of Invega  for schizophrenia presents with concern for suicidality with plan.  Hypertensive on intake vital signs otherwise normal.  Cardiopulmonary Sam unremarkable, abdominal exam is benign.  Flat affect, does not appear to responding to internal stimuli but does not continue to endorse SI with plan.  Amount and/or Complexity of Data Reviewed Labs: ordered.    Details: CBC unremarkable, CMP with mild hyponatremia of 133, alcohol  is negative, UDS positive for cocaine and fentanyl .   Patient is medically cleared at this time, pending TTS evaluation. Behavioral health provider Brian Olp, NP recommending inpatient hospitalization for psychiatric management. This chart was dictated using voice recognition software, Dragon. Despite the best efforts of this provider to proofread and correct errors, errors may still occur which can change documentation meaning.       Final diagnoses:  None    ED Discharge Orders     None          [name] [name] 03/27/24 0641    Emil Share, DO 03/27/24 0645  "

## 2024-03-27 NOTE — ED Notes (Signed)
 Patient continues to try to leave. Difficult to redirect at times.  Provider aware.  Patient tried to hit NT with cane.

## 2024-03-27 NOTE — ED Notes (Signed)
 Patient banging his hand against the wall and banging call bell against wall. Stating he wants to die.  Patient asking for heroin injection.  Patient redirected and the hospital setting explained.

## 2024-03-27 NOTE — Plan of Care (Signed)
?  Problem: Education: Goal: Knowledge of [facility] Education information/materials will improve Outcome: Progressing Goal: Emotional status will improve Outcome: Progressing Goal: Mental status will improve Outcome: Progressing Goal: Verbalization of understanding the information provided will improve Outcome: Progressing   Problem: Activity: Goal: Interest or engagement in activities will improve Outcome: Progressing Goal: Sleeping patterns will improve Outcome: Progressing   Problem: Coping: Goal: Ability to verbalize frustrations and anger appropriately will improve Outcome: Progressing Goal: Ability to demonstrate self-control will improve Outcome: Progressing   Problem: Physical Regulation: Goal: Ability to maintain clinical measurements within normal limits will improve Outcome: Progressing   

## 2024-03-27 NOTE — Progress Notes (Signed)
 Pt calm and pleasant during assessment denying SI/HI/AVH. Pt isolative to his room tonight and stated to this writer, I just want to sleep. Pt didn't have any medication scheduled tonight and hasn't requested anything PRN as of now. Pt being monitored Q 15 minutes for safety per unit protocol, remains safe on the unit

## 2024-03-27 NOTE — ED Triage Notes (Signed)
 Patient states he has been having a mental break down over the last few weeks. States he has schizophrenia. Reports SI, that he would shoot himself. States he would be better if he could have a dose of his medication.

## 2024-03-27 NOTE — ED Notes (Signed)
 [facility] called pts [facility] for collateral information. Per Jayla at the [facility] crisis line, pt is not due for his injection until January 12th so the earliest he could receive it would be next week. Pt spoke with Mitzie last night on the crisis line and had expressed SI and HI towards his significant other. Pt was advised to go to the [facility] ED. [facility] reported to [facility] that pt was being IVC'd and will be admitted inpatient. Ileana was appreciative of the information.   Chesley Holt, [facility]  03/27/24

## 2024-03-27 NOTE — ED Notes (Signed)
 Patient off  unit to [facility]  BMU per provider. Patient alert, cooperative, no s/s of distress at time of discharge. Discharge information and belongings given [employer]. Patient ambulatory. Patient off unit using w/c, escorted and transported by sheriff.

## 2024-03-27 NOTE — Group Note (Signed)
 Recreation Therapy Group Note   Group Topic:Coping Skills  Group Date: 03/27/2024 Start Time: 1530 End Time: 1605 Facilitators: Celestia Jeoffrey BRAVO, LRT, CTRS Location: Dayroom  Group Description: Meditation. LRT and patients discussed what they know about meditation and mindfulness. LRT played a Deep Breathing Meditation exercise script for patients to follow along to. LRT and patients discussed how meditation and deep breathing can be used as a coping skill post--discharge to help manage symptoms of stress.   Goal Area(s) Addressed: Patient will practice using relaxation technique. Patient will identify a new coping skill.  Patient will follow multistep directions to reduce anxiety and stress.   Affect/Mood: N/A   Participation Level: Did not attend    Clinical Observations/Individualized Feedback: Patient did not attend due to being a new admission.   Plan: Continue to engage patient in RT group sessions 2-3x/week.   Jeoffrey BRAVO Celestia, LRT, CTRS 03/27/2024 5:11 PM

## 2024-03-28 DIAGNOSIS — F1490 Cocaine use, unspecified, uncomplicated: Secondary | ICD-10-CM

## 2024-03-28 DIAGNOSIS — F1193 Opioid use, unspecified with withdrawal: Secondary | ICD-10-CM

## 2024-03-28 DIAGNOSIS — Z8659 Personal history of other mental and behavioral disorders: Secondary | ICD-10-CM

## 2024-03-28 DIAGNOSIS — R45850 Homicidal ideations: Secondary | ICD-10-CM

## 2024-03-28 DIAGNOSIS — F19959 Other psychoactive substance use, unspecified with psychoactive substance-induced psychotic disorder, unspecified: Secondary | ICD-10-CM | POA: Diagnosis not present

## 2024-03-28 DIAGNOSIS — F1590 Other stimulant use, unspecified, uncomplicated: Secondary | ICD-10-CM

## 2024-03-28 DIAGNOSIS — R45851 Suicidal ideations: Secondary | ICD-10-CM | POA: Diagnosis not present

## 2024-03-28 LAB — GLUCOSE, CAPILLARY: Glucose-Capillary: 179 mg/dL — ABNORMAL HIGH (ref 70–99)

## 2024-03-28 MED ORDER — ONDANSETRON HCL 4 MG/5ML PO SOLN
4.0000 mg | Freq: Once | ORAL | Status: DC
  Filled 2024-03-28: qty 5

## 2024-03-28 MED ORDER — BUPRENORPHINE HCL-NALOXONE HCL 8-2 MG SL SUBL: 1.0000 | SUBLINGUAL_TABLET | Freq: Two times a day (BID) | SUBLINGUAL | Status: DC

## 2024-03-28 MED ORDER — BUPRENORPHINE HCL-NALOXONE HCL 2-0.5 MG SL SUBL: 1.0000 | SUBLINGUAL_TABLET | SUBLINGUAL | Status: AC | PRN

## 2024-03-28 MED ORDER — ONDANSETRON HCL 4 MG/2ML IJ SOLN
4.0000 mg | Freq: Once | INTRAMUSCULAR | Status: AC
  Administered 2024-03-28: 4 mg via INTRAMUSCULAR
  Filled 2024-03-28: qty 2

## 2024-03-28 NOTE — [facility] Suicide Risk Assessment (Signed)
 [facility] INPATIENT:  Family/Significant Other Suicide Prevention Education  Suicide Prevention Education:  Education Completed; Carol Allegrezza/girlfriend (,  (name of family member/significant other) has been identified by the patient as the family member/significant other with whom the patient will be residing, and identified as the person(s) who will aid the patient in the event of a mental health crisis (suicidal ideations/suicide attempt).  With written consent from the patient, the family member/significant other has been provided the following suicide prevention education, prior to the and/or following the discharge of the patient.  The suicide prevention education provided includes the following: Suicide risk factors Suicide prevention and interventions National Suicide Hotline telephone number [facility] assessment telephone number [facility] Emergency Assistance 911 [facility] and/or Residential Mobile Crisis Unit telephone number  Request made of family/significant other to: Remove weapons (e.g., guns, rifles, knives), all items previously/currently identified as safety concern.   Remove drugs/medications (over-the-counter, prescriptions, illicit drugs), all items previously/currently identified as a safety concern.  The family member/significant other verbalizes understanding of the suicide prevention education information provided.  The family member/significant other agrees to remove the items of safety concern listed above.  Allegrezza shared that the main reason that pt is in the hospital is because they feel that he needs his long-acting injectable. She stated that they feel the shot has worn off and that he needs to get an injection. She shared that pt is seen by ACTT team once a month and was told when he contacted them regarding his shot that he should come in the hospital. Allegrezza denied feeling that pt is a danger to himself or anyone else. She also  denied pt having access to any weapons. She stated that pt just really needs medication to help him calm down. She inquired about what would be done here for pt. CSW explained the typical course of treatment. She asked that someone call her with an update. No other concerns expressed. Contact ended without incident.   Nadara JONELLE Fam 03/28/2024, 3:14 PM

## 2024-03-28 NOTE — Group Note (Signed)
 Recreation Therapy Group Note   Group Topic:Coping Skills  Group Date: 03/28/2024 Start Time: 1530 End Time: 1605 Facilitators: Celestia Jeoffrey FORBES ARTICE, CTRS Location: Craft Room  Group Description: Coping A-Z. LRT and patients engage in a guided discussion on what coping skills are and gave specific examples. LRT passed out a handout labeled Coping A-Z with blank spaces beside each letter. LRT prompted patients to come up with a coping skill for each of the letters. LRT and patients went over the handout and gave ideas for each letter if anyone had any blanks left on their paper. Patients kept this handout with them that listed 26 different coping skills.   Goal Area(s) Addressed: Patients will be able to define coping skills. Patient will identify new coping skills.  Patient will increase communication.   Affect/Mood: N/A   Participation Level: Did not attend    Clinical Observations/Individualized Feedback: Patient did not attend.  Plan: Continue to engage patient in RT group sessions 2-3x/week.   [address], LRT, CTRS 03/28/2024 4:16 PM

## 2024-03-28 NOTE — Group Note (Signed)
 Recreation Therapy Group Note   Group Topic:Goal Setting  Group Date: 03/28/2024 Start Time: 1000 End Time: 1100 Facilitators: Celestia Jeoffrey BRAVO, LRT, CTRS Location: Craft Room  Group Description: Product/process Development Scientist. Patients were given many different magazines, a glue stick, markers, and a piece of cardstock paper. LRT and pts discussed the importance of having goals in life. LRT and pts discussed the difference between short-term and long-term goals, as well as what a SMART goal is. LRT encouraged pts to create a vision board, with images they picked and then cut out with safety scissors from the magazine, for themselves, that capture their short and long-term goals. LRT encouraged pts to show and explain their vision board to the group.   Goal Area(s) Addressed:  Patient will gain knowledge of short vs. long term goals.  Patient will identify goals for themselves. Patient will practice setting SMART goals. Patient will verbalize their goals to LRT and peers.   Affect/Mood: N/A   Participation Level: Did not attend    Clinical Observations/Individualized Feedback: Patient did not attend.  Plan: Continue to engage patient in RT group sessions 2-3x/week.   Jeoffrey BRAVO Celestia, LRT, CTRS 03/28/2024 11:15 AM

## 2024-03-28 NOTE — [facility] Suicide Risk Assessment (Signed)
 [facility] INPATIENT:  Family/Significant Other Suicide Prevention Education  Suicide Prevention Education:  Contact Attempts: Carol/girlfriend [phone]), has been identified by the patient as the family member/significant other with whom the patient will be residing, and identified as the person(s) who will aid the patient in the event of a mental health crisis.  With written consent from the patient, two attempts were made to provide suicide prevention education, prior to and/or following the patient's discharge.  We were unsuccessful in providing suicide prevention education.  A suicide education pamphlet was given to the patient to share with family/significant other.  Date and time of first attempt: 03/28/24 at 1:37 PM Date and time of second attempt: Second attempt needed.  CSW attempted contact but phone just rang without switching to voicemail or answering service.   [name] Fam 03/28/2024, 1:37 PM

## 2024-03-28 NOTE — Progress Notes (Signed)
"   Patient is experiencing n/v and vomited on bedroom floor and on bed. Patient provided with assistance and given IM zofran . Bed and floor disinfected along with clean linen provided. Patient currently resting in bed with no further episodes of emesis. Provider notified and updated.  "

## 2024-03-28 NOTE — [facility] Suicide Risk Assessment (Signed)
 [facility] Admission Suicide Risk Assessment   Nursing information obtained from:  Patient Demographic factors:  Low socioeconomic status, Unemployed Current Mental Status:  Suicidal ideation indicated by others Loss Factors:  Financial problems / change in socioeconomic status Historical Factors:  NA Risk Reduction Factors:  NA  Total Time spent with patient: 1 hour Principal Problem: Schizophrenia (HCC) Diagnosis:  Principal Problem:   Schizophrenia (HCC)  Subjective Data: See HPI  Continued Clinical Symptoms:  Alcohol  Use Disorder Identification Test Final Score (AUDIT): 0 The Alcohol  Use Disorders Identification Test, Guidelines for Use in Primary Care, Second Edition.  World Science Writer [facility]). Score between 0-7:  no or low risk or alcohol  related problems. Score between 8-15:  moderate risk of alcohol  related problems. Score between 16-19:  high risk of alcohol  related problems. Score 20 or above:  warrants further diagnostic evaluation for alcohol  dependence and treatment.   CLINICAL FACTORS:   Alcohol Starr Abuse/Dependencies Schizophrenia:   Command hallucinatons Depressive state Previous Psychiatric Diagnoses and Treatments   Musculoskeletal: Strength & Muscle Tone: within normal limits Gait & Station: normal Patient leans: N/A  Psychiatric Specialty Exam:  Presentation  General Appearance:  Disheveled  Eye Contact: Minimal  Speech: Slow  Speech Volume: Decreased  Handedness:No data recorded  Mood and Affect  Mood: Irritable; Worthless; Dysphoric; Depressed  Affect: Congruent; Constricted   Thought Process  Thought Processes: Linear  Descriptions of Associations:Intact  Orientation:Full (Time, Place and Person)  Thought Content:Perseveration  History of Schizophrenia/Schizoaffective disorder:Yes  Duration of Psychotic Symptoms:Greater than six months  Hallucinations:Hallucinations: Visual; Auditory  Ideas of  Reference:None  Suicidal Thoughts:Suicidal Thoughts: No (Pt on admission reports SI)  Homicidal Thoughts:Homicidal Thoughts: No (Pt has told Act team about HI - towards GF)   Sensorium  Memory: Immediate Poor; Recent Poor  Judgment: Impaired  Insight: Poor   Executive Functions  Concentration: Fair  Attention Span: Fair  Recall: [employer] of Knowledge: Fair  Language: Fair   Psychomotor Activity  Psychomotor Activity: Psychomotor Activity: Psychomotor Retardation; Restlessness   Assets  Assets: Desire for Improvement; Housing; Intimacy; Physical Health; Social Support; Manufacturing Systems Engineer   Sleep  Sleep: Sleep: Poor    Physical Exam: Physical Exam ROS Blood pressure (!) 155/96, pulse 99, temperature 97.8 F (36.6 C), temperature source Oral, resp. rate 16, height 5' 7 (1.702 m), weight 83.5 kg, SpO2 94%. Body mass index is 28.82 kg/m.   COGNITIVE FEATURES THAT CONTRIBUTE TO RISK:  Polarized thinking    SUICIDE RISK:   Moderate:  Frequent suicidal ideation with limited intensity, and duration, some specificity in terms of plans, no associated intent, good self-control, limited dysphoria/symptomatology, some risk factors present, and identifiable protective factors, including available and accessible social support.  PLAN OF CARE: Admit to [facility]   I certify that inpatient services furnished can reasonably be expected to improve the patient's condition.   Desmond Chimera, MD 03/28/2024, 3:09 PM

## 2024-03-28 NOTE — Progress Notes (Signed)
 Patient did not eat much of his dinner and refused his pepcid  this evening. Patient continues to prefer to rest after experiencing some n/v and chills earlier today. Latest cow score 6. Patient resting calmly with no s/s of current distress.

## 2024-03-28 NOTE — Group Note (Signed)
 [facility] LCSW Group Therapy Note   Group Date: 03/28/2024 Start Time: 1315 End Time: 1400  Type of Therapy/Topic:  Group Therapy:  Feelings about Diagnosis  Participation Level:  Did Not Attend   Description of Group:    This group will allow patients to explore their thoughts and feelings about diagnoses they have received. Patients will be guided to explore their level of understanding and acceptance of these diagnoses. Facilitator will encourage patients to process their thoughts and feelings about the reactions of others to their diagnosis, and will guide patients in identifying ways to discuss their diagnosis with significant others in their lives. This group will be process-oriented, with patients participating in exploration of their own experiences as well as giving and receiving support and challenge from other group members.   Therapeutic Goals: 1. Patient will demonstrate understanding of diagnosis as evidence by identifying two or more symptoms of the disorder:  2. Patient will be able to express two feelings regarding the diagnosis 3. Patient will demonstrate ability to communicate their needs through discussion and/or role plays  Summary of Patient Progress:    X    Therapeutic Modalities:   Cognitive Behavioral Therapy Brief Therapy Feelings Identification    Sherryle JINNY Margo, LCSW

## 2024-03-28 NOTE — BHH Counselor (Signed)
 Adult Comprehensive Assessment  Patient ID: [name], male   DOB: 1965-11-22, 58 y.o.   MRN: [id number]  Information Source: Information source: Patient  Current Stressors:  Patient states their primary concerns and needs for treatment are:: I need my shot. Pt stated that he said that he wanted to kill himself in order to get his shot but this got him committed. Patient states their goals for this hospitilization and ongoing recovery are:: I'd like to work on going home. Educational / Learning stressors: None reported Employment / Job issues: None reported Family Relationships: None reported Surveyor, Quantity / Lack of resources (include bankruptcy): None reported Housing / Lack of housing: None reported Physical health (include injuries & life threatening diseases): None reported Social relationships: None reported Substance abuse: None reported Bereavement / Loss: None reported  Living/Environment/Situation:  Living Arrangements: Spouse/significant other Living conditions (as described by patient or guardian): Pt reported that he lives in a house in [place]. It's nice and quiet. Who else lives in the home?: Pt and his girlfriend. How long has patient lived in current situation?: Four years. What is atmosphere in current home: Comfortable, Loving, Supportive  Family History:  Marital status: Long term relationship Long term relationship, how long?: Pt reported being in a relationship with his girlfriend for 15 years. What types of issues is patient dealing with in the relationship?: Pt denied any issues. Are you sexually active?: No What is your sexual orientation?: Unable to assess Does patient have children?: Yes How many children?: 2 (Son and daughter both in their 73s) How is patient's relationship with their children?: Not good but I reach out to them to let them know I'm alive.  Childhood History:  By whom was/is the patient raised?: Both parents Additional  childhood history information: Good, quiet, loving. Pt reported he was raised by both parents. Description of patient's relationship with caregiver when they were a child: Good. Patient's description of current relationship with people who raised him/her: Pt reported that relationship with his mother is good. He shared that his father is deceased. How were you disciplined when you got in trouble as a child/adolescent?: Beat and told not to do it again. Does patient have siblings?: Yes Number of Siblings: 2 (Pt is the middle child.) Description of patient's current relationship with siblings: Good. Did patient suffer any verbal/emotional/physical/sexual abuse as a child?: No Did patient suffer from severe childhood neglect?: No Has patient ever been sexually abused/assaulted/raped as an adolescent or adult?: No Was the patient ever a victim of a crime or a disaster?: No Witnessed domestic violence?: No Has patient been affected by domestic violence as an adult?: No  Education:  Highest grade of school patient has completed: High school graduate. 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 Has Patient ever Been in the [employer]?: No  Financial Resources:   Surveyor, Quantity resources: [employer], Medicare, [place] Does patient have a representative payee or guardian?: No  Alcohol /Substance Abuse:   What has been your use of drugs/alcohol  within the last 12 months?: Pt denied any use of substances. UDS was positive for cocaine and fentanyl . If attempted suicide, did drugs/alcohol  play a role in this?: No Alcohol /Substance Abuse Treatment Hx: Denies past history If yes, describe treatment: N/A Has alcohol /substance  abuse  ever caused legal problems?: No  Social Support System:   Patient's Community Support System: Good Describe Community Support System: I got an ACT team that helps me. Type of faith/religion: Pt denied. How does patient's faith help to cope with current illness?: N/A  Leisure/Recreation:   Do You Have Hobbies?: Yes Leisure and Hobbies: Play guitar.  Strengths/Needs:   What is the patient's perception of their strengths?: Anything I put my mind to. Patient states these barriers may affect/interfere with their treatment: Pt denied any barriers. Patient states these barriers may affect their return to the community: Pt denied any barriers.  Discharge Plan:   Currently receiving community mental health services: Yes (From Whom) (ACTT services) Patient states concerns and preferences for aftercare planning are: Pt would like to remain with current provider. Patient states they will know when they are safe and ready for discharge when: I already do. Does patient have access to transportation?: No Does patient have financial barriers related to discharge medications?: No Plan for no access to transportation at discharge: CSW will assist with transportation at discharge. Will patient be returning to same living situation after discharge?: Yes  Summary/Recommendations:   Summary and Recommendations (to be completed by the evaluator): Patient is a 58 year old, single but partnered, male from [place], [place] [facility] [place]). He reported that he came into the hospital because he needed his shot (long-acting injectable). Pt admitted that he said he wanted to kill himself to get the shot sooner, but this got him committed. He expressed desire to work on going home. Per initial triage note he denied HI plan but endorsed plan to shoot himself. Pt reported that he lives at home with his girlfriend of 15 years and plans to return there upon discharge. He denied any stressors and stated that he only  wanted his shot. Pt denied any history of abuse or trauma. He also denied any substance use or history of treatment or legal issues. However, his UDS was positive for cocaine and fentanyl . Pt reported that he receives outpatient services from an ACTT team but was unable to specify from whom. He shared that he would like to continue with ACTT services upon discharge. Recommendations include: crisis stabilization, therapeutic milieu, encourage group attendance and participation, medication management for mood stabilization and development of a comprehensive mental wellness/sobriety plan.  Nadara JONELLE Fam. 03/28/2024

## 2024-03-28 NOTE — H&P (Signed)
 [name] is an 58 y.o. male.   Chief Complaint: SI w/ plan to shoot  HPI:   Mr. [name] is a 58 year old male with a known history of schizophrenia and polysubstance use who presented voluntarily to the [facility] Emergency Department reporting a mental breakdown over the past several weeks. On initial evaluation, he endorsed suicidal ideation with a stated plan to shoot himself and reported auditory hallucinations instructing him to harm himself and others. He denied a specific homicidal plan and denied access to weapons.  The patient attributed symptom worsening to medication issues, stating he believed he was overdue for his monthly Invega  injection and that receiving the injection would improve his condition. Collateral information from his significant other supported that he does significantly better when adherent with his long-acting antipsychotic and suggested he may be overdue. However, collateral from the ACTT confirmed that his next injection is not due until January 12 and that the patient had recently reported suicidal and homicidal ideation toward his significant other, prompting recommendation for ED evaluation.  During ED and behavioral health assessments, the patients reports of suicidal and homicidal ideation were inconsistent. At times, he denied active SI, HI, auditory or visual hallucinations.  On interview, at [facility] he said, presented to the hospital with a request for an Invega  shot, which he stated is administered monthly. He reported visiting the emergency room and falsely claiming suicidal ideation to receive the shot, but clarified he was not actually experiencing suicidal thoughts. He has a history of schizophrenia, for which he has been receiving Invega  injections. The last injection was on November 3rd. He reported hearing sixteen voices, mostly good, urging him to get the shot. He denied that these voices ever instructed him to harm himself or others. He started hearing  voices and seeing people around the age of 27 or 56 and had been diagnosed with schizophrenia at [facility]. He stated he has not been using drugs or alcohol  recently, except for a brief period of opiate use once a week for two weeks, three weeks prior to the visit.  Past Psychiatric History Schizophrenia Major depressive disorder Anxiety Polysubstance use  Post-traumatic stress disorder [facility] Followed by ACTT for medication management and outpatient services History of suicidal ideation; no documented suicide attempts History of non-suicidal self-injurious behavior (per chart)  Allergies Levofloxacin  Chlorhexidine   Substance Use History History of cocaine and fentanyl  use Urine drug screen positive for cocaine and fentanyl  on admission Alcohol  use disorder in remission per history Last reported use unclear  Family Psychiatric History Denies    SOCIAL HISTORY:  - Drug Use: Opiates once a week (recent, stopped three weeks ago) - Alcohol  Use: None - Smoking: smoker - Living Situation: Has a home - Relationship Status: Has a girlfriend - Education: Completed 12th grade  Past Medical History:  Diagnosis Date   Ambulates with cane    straight cane   Anxiety    Arthritis    Asthma    Depression    Duodenal adenoma    GERD (gastroesophageal reflux disease)    Headache    Hepatitis    Hepatitis C -   Hypertension    Pneumonia    hx   Post-traumatic osteoarthritis of left ankle    Pre-diabetes    hgba1c on 09/2020 was 5.9   Presence of right artificial knee joint 12/17/2016   Schizophrenia (HCC) 07/2020   from [facility]   Stroke (HCC)  2009-or10   Substance abuse (HCC) 07/2020   heroin abuse from [facility]   Type 2 diabetes mellitus (HCC) 07/09/2015    Past Surgical History:  Procedure Laterality Date   ANKLE ARTHROSCOPY Left 10/20/2017   Procedure: ANKLE ARTHROSCOPY;  Surgeon: Harden Jerona GAILS, MD;  [facility] OR;  Service: Orthopedics;  Laterality: Left;   ANKLE FUSION Left 10/20/2017   ANKLE FUSION Left 10/20/2017   Procedure: LEFT ANKLE FUSION;  Surgeon: Harden Jerona GAILS, MD;  Location: [facility] OR;  Service: Orthopedics;  Laterality: Left;   BACK SURGERY     ESOPHAGOGASTRODUODENOSCOPY Left 12/05/2013   Procedure: ESOPHAGOGASTRODUODENOSCOPY (EGD);  Surgeon: Elsie Cree, MD;  Location: [facility];  Service: Endoscopy;  Laterality: Left;   ESOPHAGOGASTRODUODENOSCOPY (EGD) WITH PROPOFOL  N/A 02/20/2015   Procedure: ESOPHAGOGASTRODUODENOSCOPY (EGD) WITH PROPOFOL ;  Surgeon: Jerrell Sol, MD;  Location: [facility];  Service: Endoscopy;  Laterality: N/A;   HARDWARE REMOVAL Left 01/29/2018   Procedure: HARDWARE REMOVAL LEFT ANKLE;  Surgeon: Harden Jerona GAILS, MD;  Location: [facility] OR;  Service: Orthopedics;  Laterality: Left;   IR LUMBAR DISC ASPIRATION W/IMG GUIDE  01/08/2020   JOINT REPLACEMENT     THORACIC LAMINECTOMY FOR EPIDURAL ABSCESS N/A 11/03/2020   Procedure: LUMBAR THREE AND LUMBAR  FOUR LAMINECTOMY FOR EVACUATION EPIDURAL ABSCESS;  Surgeon: Cheryle Debby LABOR, MD;  Location: [facility] OR;  Service: Neurosurgery;  Laterality: N/A;   TOOTH EXTRACTION N/A 01/01/2023   Procedure: EXTRACTION TEETH NUMBER EIGHTEEN,NINETEEN,TWENTY,TWENTY-TWO,TWENTY-SEVEN,TWENTY-EIGHT,TWENTY-NINE,THIRTY-ONE, ALVEOLOPLASTY, REDUCTION HYPERPLASTIC LEFT MAXILLARY OSSEOUS TUBEROSITY, REMOVAL BILATERAL MANDIBULAR LINGUAL TORI;  Surgeon: Sheryle Hamilton, DMD;  Location: [facility] OR;  Service: Oral Surgery;  Laterality: N/A;   TOTAL KNEE ARTHROPLASTY Right 07/21/2016   Procedure: TOTAL KNEE ARTHROPLASTY;  Surgeon: Hamilton Cordella Hutchinson, MD;  Location[facility] OR;  Service: Orthopedics;  Laterality: Right;   UPPER GASTROINTESTINAL ENDOSCOPY      Family History  Adopted: Yes  Problem Relation Age of Onset   Colon cancer Neg Hx    Esophageal cancer Neg Hx    Rectal cancer Neg Hx    Stomach cancer Neg Hx    Social  History:  reports that he has been smoking cigarettes. He has a 57 pack-year smoking history. He has never used smokeless tobacco. He reports that he does not currently use alcohol  after a past usage of about 3.0 standard drinks of alcohol  per week. He reports that he does not currently use drugs after having used the following drugs: Marijuana, Heroin, and Cocaine.  Allergies: Allergies[1]  Medications Prior to Admission  Medication Sig Dispense Refill   acetaminophen  (TYLENOL ) 500 MG tablet Take 500 mg by mouth every 8 (eight) hours as needed for moderate pain or mild pain.     albuterol  (PROVENTIL  HFA;VENTOLIN  HFA) 108 (90 Base) MCG/ACT inhaler Inhale 1 puff into the lungs 2 (two) times daily as needed for wheezing or shortness of breath. (Patient taking differently: Inhale 2 puffs into the lungs 2 (two) times daily as needed for wheezing or shortness of breath.) 1 Inhaler 1   famotidine  (PEPCID ) 20 MG tablet Take 20 mg by mouth 2 (two) times daily.     fluticasone  (FLONASE ) 50 MCG/ACT nasal spray Place 1 spray into both nostrils 2 (two) times daily as needed for allergies or rhinitis.     ibuprofen  (ADVIL ) 800 MG tablet Take 800 mg by mouth 3 (three) times daily.     INVEGA  TRINZA 819 MG/2.63ML SUSY injection Inject 819 mg into the muscle every 30 (thirty) days.  metoprolol  succinate (TOPROL -XL) 50 MG 24 hr tablet Take 1 tablet (50 mg total) by mouth daily. Take with or immediately following a meal. (Patient taking differently: Take 25 mg by mouth daily. Take with or immediately following a meal.) 30 tablet 0   ondansetron  (ZOFRAN ) 4 MG tablet Take 4 mg by mouth 3 (three) times daily as needed for nausea or vomiting.     SUMAtriptan (IMITREX) 50 MG tablet Take 50 mg by mouth every 2 (two) hours as needed for migraine.      Results for orders placed or performed during the hospital encounter of 03/27/24 (from the past 48 hours)  Comprehensive metabolic panel     Status: Abnormal   Collection  Time: 03/27/24  2:48 AM  Result Value Ref Range   Sodium 133 (L) 135 - 145 mmol/L   Potassium 3.6 3.5 - 5.1 mmol/L   Chloride 98 98 - 111 mmol/L   CO2 22 22 - 32 mmol/L   Glucose, Bld 114 (H) 70 - 99 mg/dL    Comment: Glucose reference range applies only to samples taken after fasting for at least 8 hours.   BUN 10 6 - 20 mg/dL   Creatinine, Ser 9.27 0.61 - 1.24 mg/dL   Calcium  9.4 8.9 - 10.3 mg/dL   Total Protein 7.6 6.5 - 8.1 g/dL   Albumin  4.5 3.5 - 5.0 g/dL   AST 34 15 - 41 U/L   ALT 6 0 - 44 U/L   Alkaline Phosphatase 147 (H) 38 - 126 U/L   Total Bilirubin 0.3 0.0 - 1.2 mg/dL   GFR, Estimated >39 >39 mL/min    Comment: (NOTE) Calculated using the CKD-EPI Creatinine Equation (2021)    Anion gap 14 5 - 15    Comment: Performed at [facility], [address] [place], [place] [address]  Ethanol     Status: None   Collection Time: 03/27/24  2:48 AM  Result Value Ref Range   Alcohol , Ethyl (B) <15 <15 mg/dL    Comment: (NOTE) For medical purposes only. Performed at [facility], [address], [place], [place] [address]   CBC with Diff     Status: None   Collection Time: 03/27/24  2:48 AM  Result Value Ref Range   WBC 6.4 4.0 - 10.5 K/uL   RBC 4.97 4.22 - 5.81 MIL/uL   Hemoglobin 14.2 13.0 - 17.0 g/dL   HCT 55.4 60.9 - 47.9 %   MCV 89.5 80.0 - 100.0 fL   MCH 28.6 26.0 - 34.0 pg   MCHC 31.9 30.0 - 36.0 g/dL   RDW 85.3 88.4 - 84.4 %   Platelets 214 150 - 400 K/uL   nRBC 0.0 0.0 - 0.2 %   Neutrophils Relative % 70 %   Neutro Abs 4.5 1.7 - 7.7 K/uL   Lymphocytes Relative 22 %   Lymphs Abs 1.4 0.7 - 4.0 K/uL   Monocytes Relative 4 %   Monocytes Absolute 0.3 0.1 - 1.0 K/uL   Eosinophils Relative 3 %   Eosinophils Absolute 0.2 0.0 - 0.5 K/uL   Basophils Relative 1 %   Basophils Absolute 0.0 0.0 - 0.1 K/uL   Immature Granulocytes 0 %   Abs Immature Granulocytes 0.02 0.00 - 0.07 K/uL    Comment: Performed at [facility], [address] [place], [place] [address]  Urine rapid drug screen (hosp performed)     Status: Abnormal   Collection Time: 03/27/24  3:04 AM  Result Value Ref Range   Opiates NEGATIVE NEGATIVE   Cocaine POSITIVE (A) NEGATIVE   Benzodiazepines NEGATIVE NEGATIVE   Amphetamines NEGATIVE NEGATIVE   Tetrahydrocannabinol NEGATIVE NEGATIVE   Barbiturates NEGATIVE NEGATIVE   Methadone Scn, Ur NEGATIVE NEGATIVE   Fentanyl  POSITIVE (A) NEGATIVE    Comment: (NOTE) Drug screen is for Medical Purposes only. Positive results are preliminary only. If confirmation is needed, notify lab within 5 days.  Drug Class                 Cutoff (ng/mL) Amphetamine and metabolites 1000 Barbiturate and metabolites 200 Benzodiazepine              200 Opiates and metabolites     300 Cocaine and metabolites     300 THC                         50 Fentanyl                     5 Methadone                   300  Trazodone  is metabolized in vivo to several metabolites,  including pharmacologically active m-CPP, which is excreted in the  urine.  Immunoassay screens for amphetamines and MDMA have potential  cross-reactivity with these compounds and may provide false positive  result.  Performed at [facility], [address], [place], [place] [address]    No results found.  Review of Systems Psychiatric: Positive for suicidal ideation, auditory hallucinations, depressed mood, anxiety, sleep disturbance, poor concentration All other systems reviewed and negative except as noted in HPI and PMH  Blood pressure (!) 155/96, pulse 99, temperature 97.8 F (36.6 C), temperature source Oral, resp. rate 16, height 5' 7 (1.702 m), weight 83.5 kg, SpO2 94%. Physical Exam  Pt refused physical exam   Pt refused CN exam.   Musculoskeletal: Strength & Muscle Tone: within normal limits Gait & Station: normal Patient leans: N/A   Psychiatric Specialty Exam:   Presentation   General Appearance:  Disheveled   Eye Contact: Minimal   Speech: Slow   Speech Volume: Decreased   Handedness:No data recorded   Mood and Affect  Mood: Irritable; Worthless; Dysphoric; Depressed   Affect: Congruent; Constricted     Thought Process  Thought Processes: Linear   Descriptions of Associations:Intact   Orientation:Full (Time, Place and Person)   Thought Content:Perseveration   History of Schizophrenia/Schizoaffective disorder:Yes   Duration of Psychotic Symptoms:Greater than six months   Hallucinations:Hallucinations: Visual; Auditory   Ideas of Reference:None   Suicidal Thoughts:Suicidal Thoughts: No (Pt on admission reports SI)   Homicidal Thoughts:Homicidal Thoughts: No (Pt has told Act team about HI - towards GF)     Sensorium  Memory: Immediate Poor; Recent Poor   Judgment: Impaired   Insight: Poor     Executive Functions  Concentration: Fair   Attention Span: Fair   Recall: [employer] of Knowledge: Fair   Language: Fair     Psychomotor Activity  Psychomotor Activity: Psychomotor Activity: Psychomotor Retardation; Restlessness     Assets  Assets: Desire for Improvement; Housing; Intimacy; Physical Health; Social Support; Communication Skills     Sleep  Sleep: Sleep: Poor   Assessment/Plan   Urine drug screen was positive for cocaine and fentanyl . Given the initial endorsement of suicidal ideation with plan, reported command hallucinations, recent substance use, inconsistent  self-report, and corroborating collateral concerns, will continue inpatient psychiatric hospitalization under IVC  Substance-induced psychotic disorder Opiate withdrawal Cocaine/stimulant use disorder Opioid use disorder History of schizophrenia Suicidal ideation Homicidal ideation  Diabetes Hypertension  Assessment/Plan  1.    Safety and Monitoring:   --  Involuntary admission to inpatient psychiatric unit for safety,  stabilization and treatment -- Daily contact with patient to assess and evaluate symptoms and progress in treatment -- Patient's case to be discussed in multi-disciplinary team meeting -- Observation Level : q15 minute checks -- Vital signs:  q12 hours -- Precautions: suicide   2. Psychiatric Diagnoses and Treatment:  Start patient on COWS protocol, prn support. Started on Suboxone withdrawal protocol.  PDMP - last filled opiates on 01/01/23 - 5 day course.  Start pt on zyprexa  5 mg BID. Reported next dose on Jan 12 for Invega  sustenen ?? Invega  Trinza ?? (Need last injection dose and Date)   Collateral from ACT team    --  The risks/benefits/side-effects/alternatives to this medication were discussed in detail with the patient and time was given for questions. The patient consents to medication trial. -- Metabolic profile and EKG monitoring obtained while on an atypical antipsychotic  (BMI: 0.00, QTC: 0.00, HbA1c: 0.00, Lipid panel: 0.00) -- Encouraged patient to participate in unit milieu and in scheduled group therapies -- Short Term Goals: Ability to identify changes in lifestyle to reduce recurrence of condition will improve, Ability to verbalize feelings will improve, Ability to disclose and discuss suicidal ideas, Ability to demonstrate self-control will improve, Ability to identify and develop effective coping behaviors will improve, Ability to maintain clinical measurements within normal limits will improve, Compliance with prescribed medications will improve, and Ability to identify triggers associated with substance abuse/mental health issues will improve -- Long Term Goals: Improvement in symptoms so as ready for discharge        3. Medical Issues Being Addressed:   HTN - Metoprolol  25 daily xl. Prn albuterol                  4. Discharge Planning:   -- Social work and case management to assist with discharge planning and identification of hospital follow-up needs prior to  discharge -- Estimated LOS: 5-7 days -- Discharge Concerns: Need to establish a safety plan; Medication compliance and effectiveness -- Discharge Goals: Return home with outpatient referrals for mental health follow-up including medication management/psychotherapy  Sephiroth Mcluckie, MD 03/28/2024, 3:11 PM       [1]  Allergies Allergen Reactions   Levofloxacin  Hives and Other (See Comments)    Confusion, hives, and itching (patient has tolerated ciprofloxacin )   Chlorhexidine  Itching and Rash

## 2024-03-28 NOTE — Progress Notes (Signed)
 Pt calm and pleasant during assessment denying SI/HI/AVH. Pt isolative to his room stating he isn't feeling well. Pt compliant with medication administration per MD orders. Pt given education, support, and encouragement to be active in his treatment plan. Pt being monitored Q 15 minutes for safety per unit protocol, remains safe on the unit

## 2024-03-28 NOTE — Plan of Care (Signed)
   Problem: Education: Goal: Emotional status will improve Outcome: Not Progressing Goal: Mental status will improve Outcome: Not Progressing

## 2024-03-28 NOTE — Group Note (Signed)
 Date:  03/28/2024 Time:  9:14 PM  Group Topic/Focus:  Healthy Communication:   The focus of this group is to discuss communication, barriers to communication, as well as healthy ways to communicate with others. Wrap-Up Group:   The focus of this group is to help patients review their daily goal of treatment and discuss progress on daily workbooks.    Participation Level:  Did Not Attend   Arlester CHRISTELLA Servant 03/28/2024, 9:14 PM

## 2024-03-28 NOTE — Plan of Care (Signed)
  Problem: Coping: Goal: Ability to verbalize frustrations and anger appropriately will improve Outcome: Progressing   Problem: Health Behavior/Discharge Planning: Goal: Compliance with treatment plan for underlying cause of condition will improve Outcome: Progressing   Problem: Safety: Goal: Periods of time without injury will increase Outcome: Progressing   

## 2024-03-29 DIAGNOSIS — I10 Essential (primary) hypertension: Secondary | ICD-10-CM

## 2024-03-29 DIAGNOSIS — F209 Schizophrenia, unspecified: Secondary | ICD-10-CM

## 2024-03-29 MED ORDER — BUPRENORPHINE HCL-NALOXONE HCL 8-2 MG SL SUBL
1.0000 | SUBLINGUAL_TABLET | Freq: Every day | SUBLINGUAL | Status: DC
  Administered 2024-03-29 – 2024-03-30 (×2): 1 via SUBLINGUAL
  Filled 2024-03-29 (×3): qty 1

## 2024-03-29 MED ORDER — HYDRALAZINE HCL 20 MG/ML IJ SOLN: 10.0000 mg | Freq: Four times a day (QID) | INTRAMUSCULAR | Status: DC | PRN

## 2024-03-29 MED ORDER — CLONIDINE HCL 0.1 MG PO TABS
0.1000 mg | ORAL_TABLET | Freq: Three times a day (TID) | ORAL | Status: DC
  Administered 2024-03-29: 0.1 mg via ORAL
  Filled 2024-03-29: qty 1

## 2024-03-29 MED ORDER — AMLODIPINE BESYLATE 5 MG PO TABS
5.0000 mg | ORAL_TABLET | Freq: Every day | ORAL | Status: DC
  Administered 2024-03-29: 5 mg via ORAL
  Filled 2024-03-29: qty 1

## 2024-03-29 MED ORDER — CLONIDINE HCL 0.1 MG PO TABS
0.0500 mg | ORAL_TABLET | Freq: Three times a day (TID) | ORAL | Status: DC
  Administered 2024-03-29: 0.05 mg via ORAL
  Filled 2024-03-29: qty 1

## 2024-03-29 MED ORDER — BUPRENORPHINE HCL-NALOXONE HCL 8-2 MG SL SUBL: 1.0000 | SUBLINGUAL_TABLET | Freq: Every day | SUBLINGUAL | Status: DC

## 2024-03-29 MED ORDER — METOPROLOL SUCCINATE ER 25 MG PO TB24
100.0000 mg | ORAL_TABLET | Freq: Every day | ORAL | Status: DC
  Administered 2024-03-29 – 2024-03-31 (×3): 100 mg via ORAL
  Filled 2024-03-29 (×3): qty 4

## 2024-03-29 MED ORDER — AMLODIPINE BESYLATE 5 MG PO TABS
10.0000 mg | ORAL_TABLET | Freq: Every day | ORAL | Status: DC
  Administered 2024-03-29 – 2024-04-03 (×6): 10 mg via ORAL
  Filled 2024-03-29 (×6): qty 2

## 2024-03-29 NOTE — Group Note (Signed)
 Date:  03/29/2024 Time:  12:48 PM  Group Topic/Focus:  Personal Choices and Values:   The focus of this group is to help patients assess and explore the importance of values in their lives, how their values affect their decisions, how they express their values and what opposes their expression.    Participation Level:  Did Not Attend   Clayborne DELENA June 03/29/2024, 12:48 PM

## 2024-03-29 NOTE — Group Note (Signed)
 Date:  03/29/2024 Time:  4:15 PM  Group Topic/Focus:  Self Care:   The focus of this group is to help patients understand the importance of self-care in order to improve or restore emotional, physical, spiritual, interpersonal, and financial health.    Participation Level:  Did Not Attend   [name] June 03/29/2024, 4:15 PM

## 2024-03-29 NOTE — Progress Notes (Signed)
" °   03/29/24 2000  Psych Admission Type (Psych Patients Only)  Admission Status Involuntary  Psychosocial Assessment  Patient Complaints None  Eye Contact Brief;Poor (patient eyes were closed throughout majority of the assessment)  Facial Expression Flat  Affect Appropriate to circumstance  Speech Soft;Slurred (slurredat times due to having no teeth)  Interaction Isolative;No initiation;Forwards little (patient denies all and stated he is alright; didn't give this clinical research associate much to work with.)  Motor Activity Slow;Shuffling  Appearance/Hygiene Disheveled;In scrubs  Behavior Characteristics Cooperative;Appropriate to situation  Mood Pleasant  Aggressive Behavior  Effect No apparent injury  Thought Process  Coherency WDL  Content WDL  Delusions None reported or observed  Perception WDL  Hallucination None reported or observed  Judgment Poor  Confusion None  Danger to Self  Current suicidal ideation? Denies  Agreement Not to Harm Self Yes  Description of Agreement Verbal  Danger to Others  Danger to Others None reported or observed    "

## 2024-03-29 NOTE — Plan of Care (Signed)
" °  Problem: Education: Goal: Emotional status will improve Outcome: Progressing Goal: Mental status will improve Outcome: Progressing Goal: Verbalization of understanding the information provided will improve Outcome: Progressing   Problem: Coping: Goal: Ability to demonstrate self-control will improve Outcome: Progressing   Problem: Health Behavior/Discharge Planning: Goal: Compliance with treatment plan for underlying cause of condition will improve Outcome: Progressing   Problem: Activity: Goal: Interest or engagement in activities will improve Outcome: Not Progressing Goal: Sleeping patterns will improve Outcome: Not Progressing   Problem: Coping: Goal: Ability to verbalize frustrations and anger appropriately will improve Outcome: Not Progressing   "

## 2024-03-29 NOTE — Progress Notes (Signed)
" °   03/29/24 0947  Psych Admission Type (Psych Patients Only)  Admission Status Involuntary  Psychosocial Assessment  Patient Complaints None  Eye Contact Brief  Facial Expression Flat  Affect Appropriate to circumstance  Speech Logical/coherent  Interaction Isolative  Motor Activity Slow  Appearance/Hygiene Disheveled  Behavior Characteristics Cooperative  Mood Pleasant  Aggressive Behavior  Effect No apparent injury  Thought Process  Coherency Incoherent  Content WDL  Delusions None reported or observed  Perception WDL  Hallucination None reported or observed  Judgment Poor  Confusion Mild  Danger to Self  Current suicidal ideation? Denies  Agreement Not to Harm Self Yes  Description of Agreement verbal  Danger to Others  Danger to Others None reported or observed    "

## 2024-03-29 NOTE — BH IP Treatment Plan (Signed)
 Interdisciplinary Treatment and Diagnostic Plan Update  03/29/2024 Time of Session: 10:35 [name] MRN: [id number]  Principal Diagnosis: Schizophrenia [facility])  Secondary Diagnoses: Principal Problem:   Schizophrenia (HCC)   Current Medications:  Current Facility-Administered Medications  Medication Dose Route Frequency Provider Last Rate Last Admin   acetaminophen  (TYLENOL ) tablet 650 mg  650 mg Oral Q6H PRN Motley-Mangrum, Jadeka A, PMHNP   650 mg at 03/28/24 2151   alum & mag hydroxide-simeth (MAALOX/MYLANTA) 200-200-20 MG/5ML suspension 30 mL  30 mL Oral Q4H PRN Motley-Mangrum, Jadeka A, PMHNP   30 mL at 03/28/24 1250   amLODipine  (NORVASC ) tablet 5 mg  5 mg Oral Daily Madaram, Kondal R, MD   5 mg at 03/29/24 9046   buprenorphine-naloxone  (SUBOXONE) 2-0.5 mg per SL tablet 1 tablet  1 tablet Sublingual PRN Shrivastava, Aryendra, MD       buprenorphine-naloxone  (SUBOXONE) 8-2 mg per SL tablet 1 tablet  1 tablet Sublingual BID Shrivastava, Aryendra, MD       cloNIDine  (CATAPRES ) tablet 0.1 mg  0.1 mg Oral TID Madaram, Kondal R, MD       haloperidol  (HALDOL ) tablet 5 mg  5 mg Oral TID PRN Motley-Mangrum, Jadeka A, PMHNP   5 mg at 03/29/24 0010   And   diphenhydrAMINE  (BENADRYL ) capsule 50 mg  50 mg Oral TID PRN Motley-Mangrum, Jadeka A, PMHNP   50 mg at 03/29/24 0010   haloperidol  lactate (HALDOL ) injection 5 mg  5 mg Intramuscular TID PRN Motley-Mangrum, Jadeka A, PMHNP       And   diphenhydrAMINE  (BENADRYL ) injection 50 mg  50 mg Intramuscular TID PRN Motley-Mangrum, Jadeka A, PMHNP       And   LORazepam  (ATIVAN ) injection 2 mg  2 mg Intramuscular TID PRN Motley-Mangrum, Jadeka A, PMHNP       haloperidol  lactate (HALDOL ) injection 10 mg  10 mg Intramuscular TID PRN Motley-Mangrum, Jadeka A, PMHNP       And   diphenhydrAMINE  (BENADRYL ) injection 50 mg  50 mg Intramuscular TID PRN Motley-Mangrum, Jadeka A, PMHNP       And   LORazepam  (ATIVAN ) injection 2 mg  2 mg Intramuscular TID  PRN Motley-Mangrum, Jadeka A, PMHNP   2 mg at 03/29/24 9762   famotidine  (PEPCID ) tablet 20 mg  20 mg Oral BID Motley-Mangrum, Jadeka A, PMHNP   20 mg at 03/29/24 0807   hydrOXYzine  (ATARAX ) tablet 25 mg  25 mg Oral TID PRN Motley-Mangrum, Jadeka A, PMHNP       magnesium  hydroxide (MILK OF MAGNESIA) suspension 30 mL  30 mL Oral Daily PRN Motley-Mangrum, Jadeka A, PMHNP       metoprolol  succinate (TOPROL -XL) 24 hr tablet 50 mg  50 mg Oral Daily Motley-Mangrum, Jadeka A, PMHNP   50 mg at 03/29/24 0807   nicotine  (NICODERM CQ  - dosed in mg/24 hours) patch 14 mg  14 mg Transdermal Daily Jadapalle, Sree, MD   14 mg at 03/29/24 9189   ondansetron  (ZOFRAN ) tablet 4 mg  4 mg Oral TID PRN Shrivastava, Aryendra, MD   4 mg at 03/28/24 2023   traZODone  (DESYREL ) tablet 50 mg  50 mg Oral QHS PRN Onuoha, Chinwendu V, NP   50 mg at 03/28/24 2151   PTA Medications: Medications Prior to Admission  Medication Sig Dispense Refill Last Dose/Taking   acetaminophen  (TYLENOL ) 500 MG tablet Take 500 mg by mouth every 8 (eight) hours as needed for moderate pain or mild pain.      albuterol  (  PROVENTIL  HFA;VENTOLIN  HFA) 108 (90 Base) MCG/ACT inhaler Inhale 1 puff into the lungs 2 (two) times daily as needed for wheezing or shortness of breath. (Patient taking differently: Inhale 2 puffs into the lungs 2 (two) times daily as needed for wheezing or shortness of breath.) 1 Inhaler 1    famotidine  (PEPCID ) 20 MG tablet Take 20 mg by mouth 2 (two) times daily.      fluticasone  (FLONASE ) 50 MCG/ACT nasal spray Place 1 spray into both nostrils 2 (two) times daily as needed for allergies or rhinitis.      ibuprofen  (ADVIL ) 800 MG tablet Take 800 mg by mouth 3 (three) times daily.      INVEGA  TRINZA 819 MG/2.63ML SUSY injection Inject 819 mg into the muscle every 30 (thirty) days.      metoprolol  succinate (TOPROL -XL) 50 MG 24 hr tablet Take 1 tablet (50 mg total) by mouth daily. Take with or immediately following a meal. (Patient  taking differently: Take 25 mg by mouth daily. Take with or immediately following a meal.) 30 tablet 0    ondansetron  (ZOFRAN ) 4 MG tablet Take 4 mg by mouth 3 (three) times daily as needed for nausea or vomiting.      SUMAtriptan (IMITREX) 50 MG tablet Take 50 mg by mouth every 2 (two) hours as needed for migraine.       Patient Stressors: Financial difficulties   Health problems   Medication change or noncompliance   Substance abuse    Patient Strengths: Active sense of humor  Supportive family/friends   Treatment Modalities: Medication Management, Group therapy, Case management,  1 to 1 session with clinician, Psychoeducation, Recreational therapy.   Physician Treatment Plan for Primary Diagnosis: Schizophrenia (HCC) Long Term Goal(s):     Short Term Goals:    Medication Management: Evaluate patient's response, side effects, and tolerance of medication regimen.  Therapeutic Interventions: 1 to 1 sessions, Unit Group sessions and Medication administration.  Evaluation of Outcomes: Not Met  Physician Treatment Plan for Secondary Diagnosis: Principal Problem:   Schizophrenia (HCC)  Long Term Goal(s):     Short Term Goals:       Medication Management: Evaluate patient's response, side effects, and tolerance of medication regimen.  Therapeutic Interventions: 1 to 1 sessions, Unit Group sessions and Medication administration.  Evaluation of Outcomes: Not Met   RN Treatment Plan for Primary Diagnosis: Schizophrenia (HCC) Long Term Goal(s): Knowledge of disease and therapeutic regimen to maintain health will improve  Short Term Goals: Ability to remain free from injury will improve, Ability to verbalize frustration and anger appropriately will improve, Ability to demonstrate self-control, Ability to participate in decision making will improve, Ability to verbalize feelings will improve, Ability to disclose and discuss suicidal ideas, Ability to identify and develop effective  coping behaviors will improve, and Compliance with prescribed medications will improve  Medication Management: RN will administer medications as ordered by provider, will assess and evaluate patient's response and provide education to patient for prescribed medication. RN will report any adverse and/or side effects to prescribing provider.  Therapeutic Interventions: 1 on 1 counseling sessions, Psychoeducation, Medication administration, Evaluate responses to treatment, Monitor vital signs and CBGs as ordered, Perform/monitor CIWA, COWS, AIMS and Fall Risk screenings as ordered, Perform wound care treatments as ordered.  Evaluation of Outcomes: Not Met   LCSW Treatment Plan for Primary Diagnosis: Schizophrenia (HCC) Long Term Goal(s): Safe transition to appropriate next level of care at discharge, Engage patient in therapeutic group addressing interpersonal concerns.  Short Term Goals: Engage patient in aftercare planning with referrals and resources, Increase social support, Increase ability to appropriately verbalize feelings, Increase emotional regulation, Facilitate acceptance of mental health diagnosis and concerns, Facilitate patient progression through stages of change regarding substance use diagnoses and concerns, Identify triggers associated with mental health/substance abuse issues, and Increase skills for wellness and recovery  Therapeutic Interventions: Assess for all discharge needs, 1 to 1 time with Social worker, Explore available resources and support systems, Assess for adequacy in community support network, Educate family and significant other(s) on suicide prevention, Complete Psychosocial Assessment, Interpersonal group therapy.  Evaluation of Outcomes: Not Met   Progress in Treatment: Attending groups: No. Participating in groups: No. Taking medication as prescribed: Yes. Toleration medication: Yes. Family/Significant other contact made: Yes, individual(s) contacted:   girlfriend, Clinical Cytogeneticist.  Patient understands diagnosis: Yes. Discussing patient identified problems/goals with staff: Yes. Medical problems stabilized or resolved: Yes. Denies suicidal/homicidal ideation: Yes. Issues/concerns per patient self-inventory: No. Other: none.   New problem(s) identified: No, Describe:  none identified.   New Short Term/Long Term Goal(s): detox, elimination of symptoms of psychosis, medication management for mood stabilization; elimination of SI thoughts; development of comprehensive mental wellness/sobriety plan.  Patient Goals:  Pt declined to attend treatment team meeting despite personal invitation by staff.    Discharge Plan or Barriers: CSW will assist pt with development of an appropriate aftercare/discharge plan.   Reason for Continuation of Hospitalization: Medication stabilization Suicidal ideation  Estimated Length of Stay: 1-7 days  Last 3 [place] Suicide Severity Risk Score: Flowsheet Row Admission (Current) from 03/27/2024 in [facility] INPATIENT BEHAVIORAL MEDICINE Most recent reading at 03/27/2024  5:00 PM ED from 03/27/2024 in [facility] Emergency Department at [facility] Most recent reading at 03/27/2024  1:25 AM ED to Hosp-Admission (Discharged) from 01/30/2023 in [facility] Most recent reading at 01/30/2023 12:23 PM  C-SSRS RISK CATEGORY No Risk High Risk No Risk    Last PHQ 2/9 Scores:    06/29/2017    1:30 PM 04/28/2017    9:46 AM 04/08/2017   10:28 AM  Depression screen PHQ 2/9  Decreased Interest 0 0 0  Down, Depressed, Hopeless 0 0 0  PHQ - 2 Score 0 0 0    Scribe for Treatment Team: Nadara JONELLE Fam, LCSW 03/29/2024 11:07 AM

## 2024-03-29 NOTE — Group Note (Signed)
 [facility] LCSW Group Therapy Note   Group Date: 03/29/2024 Start Time: 1300 End Time: 1340   Type of Therapy/Topic:  Group Therapy:  Emotion Regulation  Participation Level:  Did Not Attend   Description of Group:    The purpose of this group is to assist patients in learning to regulate negative emotions and experience positive emotions. Patients will be guided to discuss ways in which they have been vulnerable to their negative emotions. These vulnerabilities will be juxtaposed with experiences of positive emotions or situations, and patients challenged to use positive emotions to combat negative ones. Special emphasis will be placed on coping with negative emotions in conflict situations, and patients will process healthy conflict resolution skills.  Therapeutic Goals: Patient will identify two positive emotions or experiences to reflect on in order to balance out negative emotions:  Patient will label two or more emotions that they find the most difficult to experience:  Patient will be able to demonstrate positive conflict resolution skills through discussion or role plays:   Summary of Patient Progress: Patient did not attend group.   Therapeutic Modalities:   Cognitive Behavioral Therapy Feelings Identification Dialectical Behavioral Therapy   Nadara JONELLE Fam, LCSW

## 2024-03-29 NOTE — Plan of Care (Signed)

## 2024-03-29 NOTE — Consult Note (Signed)
 Initial Consultation Note   Patient: [name] FMW:[id number] DOB: 1965/08/26 PCP: Sigrid Deidra Fox, MD DOA: 03/27/2024 DOS: the patient was seen and examined on 03/29/2024 Primary service: Donnelly Mellow, MD  Referring physician: Katrinka Camelia CROME, PA-C  Reason for consult: HTN  Assessment and Plan:  # HTN --BP 182/106 on presentation.  Pt was ordered Toprol  50 mg daily, amlodipine  5 mg daily, and clonidine  0.05 mg TID. --Per home med list, pt was taking Toprol  25 mg PTA. --increase Toprol  to 100 mg --increase amlodipine  to 10 mg --d/c clonidine  due to it's association with re-bound hypertension.  # Drowsiness  --Pt appeared sleepy, but arousable and able to answer questions.  Pt had repeatedly said to staff that he just wanted to rest and sleep.  Since last night, pt had received trazodone , haldol , benadryl  and IV ativan , which may be causing his drowsiness. --avoiding sedating meds   TRH will continue to follow the patient.  HPI: [name] is a 58 y.o. male with past medical history of schizophrenia, polysubstance and HTN who presented with mental break down and SI.   Past Medical History:  Diagnosis Date   Ambulates with cane    straight cane   Anxiety    Arthritis    Asthma    Depression    Duodenal adenoma    GERD (gastroesophageal reflux disease)    Headache    Hepatitis    Hepatitis C -   Hypertension    Pneumonia    hx   Post-traumatic osteoarthritis of left ankle    Pre-diabetes    hgba1c on 09/2020 was 5.9   Presence of right artificial knee joint 12/17/2016   Schizophrenia (HCC) 07/2020   from [facility]   Stroke [facility])    2009-or10   Substance abuse (HCC) 07/2020   heroin abuse from [facility]   Type 2 diabetes mellitus (HCC) 07/09/2015   Past Surgical History:  Procedure Laterality Date   ANKLE ARTHROSCOPY Left 10/20/2017   Procedure: ANKLE ARTHROSCOPY;  Surgeon: Harden Jerona GAILS, MD;  Location: [facility] OR;  Service: Orthopedics;  Laterality:  Left;   ANKLE FUSION Left 10/20/2017   ANKLE FUSION Left 10/20/2017   Procedure: LEFT ANKLE FUSION;  Surgeon: Harden Jerona GAILS, MD;  Location[facility] OR;  Service: Orthopedics;  Laterality: Left;   BACK SURGERY     ESOPHAGOGASTRODUODENOSCOPY Left 12/05/2013   Procedure: ESOPHAGOGASTRODUODENOSCOPY (EGD);  Surgeon: Elsie Cree, MD;  Location: [facility];  Service: Endoscopy;  Laterality: Left;   ESOPHAGOGASTRODUODENOSCOPY (EGD) WITH PROPOFOL  N/A 02/20/2015   Procedure: ESOPHAGOGASTRODUODENOSCOPY (EGD) WITH PROPOFOL ;  Surgeon: Jerrell Sol, MD;  Location: [facility];  Service: Endoscopy;  Laterality: N/A;   HARDWARE REMOVAL Left 01/29/2018   Procedure: HARDWARE REMOVAL LEFT ANKLE;  Surgeon: Harden Jerona GAILS, MD;  Location[facility] OR;  Service: Orthopedics;  Laterality: Left;   IR LUMBAR DISC ASPIRATION W/IMG GUIDE  01/08/2020   JOINT REPLACEMENT     THORACIC LAMINECTOMY FOR EPIDURAL ABSCESS N/A 11/03/2020   Procedure: LUMBAR THREE AND LUMBAR  FOUR LAMINECTOMY FOR EVACUATION EPIDURAL ABSCESS;  Surgeon: Cheryle Debby LABOR, MD;  Location: [facility] OR;  Service: Neurosurgery;  Laterality: N/A;   TOOTH EXTRACTION N/A 01/01/2023   Procedure: EXTRACTION TEETH NUMBER EIGHTEEN,NINETEEN,TWENTY,TWENTY-TWO,TWENTY-SEVEN,TWENTY-EIGHT,TWENTY-NINE,THIRTY-ONE, ALVEOLOPLASTY, REDUCTION HYPERPLASTIC LEFT MAXILLARY OSSEOUS TUBEROSITY, REMOVAL BILATERAL MANDIBULAR LINGUAL TORI;  Surgeon: Sheryle Hamilton, DMD;  Location: [facility] OR;  Service: Oral Surgery;  Laterality: N/A;   TOTAL KNEE ARTHROPLASTY Right 07/21/2016   Procedure: TOTAL KNEE ARTHROPLASTY;  Surgeon: Hamilton Cordella Hutchinson,  MD;  Location: [facility] OR;  Service: Orthopedics;  Laterality: Right;   UPPER GASTROINTESTINAL ENDOSCOPY     Social History:  reports that he has been smoking cigarettes. He has a 57 pack-year smoking history. He has never used smokeless tobacco. He reports that he does not currently use alcohol  after a past usage of about 3.0 standard drinks of alcohol  per  week. He reports that he does not currently use drugs after having used the following drugs: Marijuana, Heroin, and Cocaine.  Allergies[1]  Family History  Adopted: Yes  Problem Relation Age of Onset   Colon cancer Neg Hx    Esophageal cancer Neg Hx    Rectal cancer Neg Hx    Stomach cancer Neg Hx     Prior to Admission medications  Medication Sig Start Date End Date Taking? Authorizing Provider  acetaminophen  (TYLENOL ) 500 MG tablet Take 500 mg by mouth every 8 (eight) hours as needed for moderate pain or mild pain.    [provider]  albuterol  (PROVENTIL  HFA;VENTOLIN  HFA) 108 (90 Base) MCG/ACT inhaler Inhale 1 puff into the lungs 2 (two) times daily as needed for wheezing or shortness of breath. Patient taking differently: Inhale 2 puffs into the lungs 2 (two) times daily as needed for wheezing or shortness of breath. 05/14/17   Diallo, Irving, MD  famotidine  (PEPCID ) 20 MG tablet Take 20 mg by mouth 2 (two) times daily. 12/10/22   [provider]  fluticasone  (FLONASE ) 50 MCG/ACT nasal spray Place 1 spray into both nostrils 2 (two) times daily as needed for allergies or rhinitis. 12/16/22   [provider]  ibuprofen  (ADVIL ) 800 MG tablet Take 800 mg by mouth 3 (three) times daily.    [provider]  INVEGA  TRINZA 819 MG/2.63ML SUSY injection Inject 819 mg into the muscle every 30 (thirty) days.    [provider]  metoprolol  succinate (TOPROL -XL) 50 MG 24 hr tablet Take 1 tablet (50 mg total) by mouth daily. Take with or immediately following a meal. Patient taking differently: Take 25 mg by mouth daily. Take with or immediately following a meal. 10/17/21 01/30/23  Cardama, Raynell Moder, MD  ondansetron  (ZOFRAN ) 4 MG tablet Take 4 mg by mouth 3 (three) times daily as needed for nausea or vomiting. 08/25/22   [provider]  SUMAtriptan (IMITREX) 50 MG tablet Take 50 mg by mouth every 2 (two) hours as needed for migraine. 12/16/22    [provider]    Physical Exam: Vitals:   03/29/24 1335 03/29/24 1630 03/29/24 1632 03/29/24 1642  BP: (!) 157/95 (!) 159/105 (!) 159/105 (!) 146/107  Pulse: 86 89  100  Resp:  18    Temp:      TempSrc:      SpO2:  98%  96%  Weight:      Height:       Constitutional: NAD, sleepy but arousable HEENT: conjunctivae and lids normal, EOMI CV: No cyanosis.   RESP: normal respiratory effort, on RA   Thank you very much for involving us  in the care of your patient.  Author: Ellouise Haber, MD 03/29/2024 6:22 PM  For on call review [email or web address].      [1]  Allergies Allergen Reactions   Levofloxacin  Hives and Other (See Comments)    Confusion, hives, and itching (patient has tolerated ciprofloxacin )   Chlorhexidine  Itching and Rash

## 2024-03-29 NOTE — Progress Notes (Signed)
" °   03/29/24 1030  Spiritual Encounters  Type of Visit Attempt (pt unavailable)  Care provided to: Pt not available  Conversation partners present during encounter Nurse  Referral source Other (comment) ([place] Consult)  Reason for visit Routine spiritual support  OnCall Visit Yes   Chaplain responded to [place] Consult in the system.  Patient was sleeping but Nurse still went ot see if patient wanted spiritual care and he asked Nurse to tell Chaplain later.  Chaplain will proactively check back with the patient later this afternoon and is closing the Consult.    Rev. Rana M. Nicholaus, M.Div. Chaplain Resident  [facility] "

## 2024-03-29 NOTE — Plan of Care (Signed)
   Problem: Education: Goal: Emotional status will improve Outcome: Progressing Goal: Mental status will improve Outcome: Progressing

## 2024-03-29 NOTE — Progress Notes (Signed)
" °   03/29/24 1530  Spiritual Encounters  Type of Visit Follow up;Attempt (pt unavailable)  Care provided to: Patient  Conversation partners present during encounter Nurse  Referral source Other (comment)  Reason for visit Routine spiritual support  OnCall Visit Yes   Chaplain followed up with patient per the earlier [facility] Consult where Chaplain found patient sleeping.  Chaplain found that patient was still sleeping.  Chaplain let staff know she'd come by.   Rev. Rana M. Nicholaus, M.Div. Chaplain Resident [facility] "

## 2024-03-29 NOTE — Progress Notes (Signed)
 [facility] MD Progress Note  03/29/2024 3:04 PM [name]  MRN:  [id number]   Subjective:  Chart reviewed, case discussed in multidisciplinary meeting, patient seen during rounds.   12/31: On interview today, patient is found sleeping in bed. Patient seen with supervising physician Dr. Ruther. He appears drowsy today but is arousable with verbal commands and is able to answer questions, though responses are minimal with one-word answers. He denies SI/HI/plan and denies hallucinations. He denies pain, nausea, or vomiting. Medications adjusted in response to sedation. Later in afternoon, patient was observed by this writer ambulating with walker on unit.  Hospitalist consulted due to persistently elevated blood pressure and sedation.   Past Psychiatric History: see h&P Family History:  Family History  Adopted: Yes  Problem Relation Age of Onset   Colon cancer Neg Hx    Esophageal cancer Neg Hx    Rectal cancer Neg Hx    Stomach cancer Neg Hx    Social History:  Social History   Substance and Sexual Activity  Alcohol  Use Not Currently   Alcohol /week: 3.0 standard drinks of alcohol    Types: 3 Cans of beer per week   Comment: quit 2015; formerly up to 2 fifths/day     Social History   Substance and Sexual Activity  Drug Use Not Currently   Types: Marijuana, Heroin, Cocaine   Comment: Heroin and marijuana in 07/2020    Social History   Socioeconomic History   Marital status: Significant Other    Spouse name: Not on file   Number of children: Not on file   Years of education: Not on file   Highest education level: Not on file  Occupational History   Not on file  Tobacco Use   Smoking status: Heavy Smoker    Current packs/day: 1.50    Average packs/day: 1.5 packs/day for 38.0 years (57.0 ttl pk-yrs)    Types: Cigarettes   Smokeless tobacco: Never   Tobacco comments:    onset age 11; upto 1.5 ppd  Vaping Use   Vaping status: Never Used  Substance and Sexual Activity    Alcohol  use: Not Currently    Alcohol /week: 3.0 standard drinks of alcohol     Types: 3 Cans of beer per week    Comment: quit 2015; formerly up to 2 fifths/day   Drug use: Not Currently    Types: Marijuana, Heroin, Cocaine    Comment: Heroin and marijuana in 07/2020   Sexual activity: Yes    Comment: declined condoms  Other Topics Concern   Not on file  Social History Narrative   Not on file   Social Drivers of Health   Tobacco Use: High Risk (03/27/2024)   Patient History    Smoking Tobacco Use: Heavy Smoker    Smokeless Tobacco Use: Never    Passive Exposure: Not on file  Financial Resource Strain: Not on file  Food Insecurity: No Food Insecurity (03/27/2024)   Epic    Worried About Radiation Protection Practitioner of Food in the Last Year: Never true    [employer] of Food in the Last Year: Never true  Transportation Needs: Unmet Transportation Needs (03/27/2024)   Epic    Lack of Transportation (Medical): Yes    Lack of Transportation (Non-Medical): No  Physical Activity: Not on file  Stress: Not on file  Social Connections: Not on file  Depression (EYV7-0): Not on file  Alcohol  Screen: Low Risk (03/27/2024)   Alcohol  Screen    Last Alcohol  Screening Score (AUDIT):  0  Housing: Unknown (03/27/2024)   Epic    Unable to Pay for Housing in the Last Year: No    Number of Times Moved in the Last Year: Not on file    Homeless in the Last Year: No  Utilities: Not At Risk (03/27/2024)   Epic    Threatened with loss of utilities: No  Health Literacy: Not on file   Past Medical History:  Past Medical History:  Diagnosis Date   Ambulates with cane    straight cane   Anxiety    Arthritis    Asthma    Depression    Duodenal adenoma    GERD (gastroesophageal reflux disease)    Headache    Hepatitis    Hepatitis C -   Hypertension    Pneumonia    hx   Post-traumatic osteoarthritis of left ankle    Pre-diabetes    hgba1c on 09/2020 was 5.9   Presence of right artificial knee joint  12/17/2016   Schizophrenia (HCC) 07/2020   from [facility]   Stroke [facility])    2009-or10   Substance abuse (HCC) 07/2020   heroin abuse from [facility]   Type 2 diabetes mellitus (HCC) 07/09/2015    Past Surgical History:  Procedure Laterality Date   ANKLE ARTHROSCOPY Left 10/20/2017   Procedure: ANKLE ARTHROSCOPY;  Surgeon: Harden Jerona GAILS, MD;  Location: [facility] OR;  Service: Orthopedics;  Laterality: Left;   ANKLE FUSION Left 10/20/2017   ANKLE FUSION Left 10/20/2017   Procedure: LEFT ANKLE FUSION;  Surgeon: Harden Jerona GAILS, MD;  Location: [facility] OR;  Service: Orthopedics;  Laterality: Left;   BACK SURGERY     ESOPHAGOGASTRODUODENOSCOPY Left 12/05/2013   Procedure: ESOPHAGOGASTRODUODENOSCOPY (EGD);  Surgeon: Elsie Cree, MD;  Location: [facility];  Service: Endoscopy;  Laterality: Left;   ESOPHAGOGASTRODUODENOSCOPY (EGD) WITH PROPOFOL  N/A 02/20/2015   Procedure: ESOPHAGOGASTRODUODENOSCOPY (EGD) WITH PROPOFOL ;  Surgeon: Jerrell Sol, MD;  Location: [facility];  Service: Endoscopy;  Laterality: N/A;   HARDWARE REMOVAL Left 01/29/2018   Procedure: HARDWARE REMOVAL LEFT ANKLE;  Surgeon: Harden Jerona GAILS, MD;  Location: [facility] OR;  Service: Orthopedics;  Laterality: Left;   IR LUMBAR DISC ASPIRATION W/IMG GUIDE  01/08/2020   JOINT REPLACEMENT     THORACIC LAMINECTOMY FOR EPIDURAL ABSCESS N/A 11/03/2020   Procedure: LUMBAR THREE AND LUMBAR  FOUR LAMINECTOMY FOR EVACUATION EPIDURAL ABSCESS;  Surgeon: Cheryle Debby LABOR, MD;  Location: [facility] OR;  Service: Neurosurgery;  Laterality: N/A;   TOOTH EXTRACTION N/A 01/01/2023   Procedure: EXTRACTION TEETH NUMBER EIGHTEEN,NINETEEN,TWENTY,TWENTY-TWO,TWENTY-SEVEN,TWENTY-EIGHT,TWENTY-NINE,THIRTY-ONE, ALVEOLOPLASTY, REDUCTION HYPERPLASTIC LEFT MAXILLARY OSSEOUS TUBEROSITY, REMOVAL BILATERAL MANDIBULAR LINGUAL TORI;  Surgeon: Sheryle Hamilton, DMD;  Location: [facility] OR;  Service: Oral Surgery;  Laterality: N/A;   TOTAL KNEE ARTHROPLASTY Right 07/21/2016   Procedure: TOTAL KNEE  ARTHROPLASTY;  Surgeon: Hamilton Cordella Hutchinson, MD;  Location: [facility] OR;  Service: Orthopedics;  Laterality: Right;   UPPER GASTROINTESTINAL ENDOSCOPY      Current Medications: Current Facility-Administered Medications  Medication Dose Route Frequency Provider Last Rate Last Admin   acetaminophen  (TYLENOL ) tablet 650 mg  650 mg Oral Q6H PRN Motley-Mangrum, Jadeka A, PMHNP   650 mg at 03/28/24 2151   alum & mag hydroxide-simeth (MAALOX/MYLANTA) 200-200-20 MG/5ML suspension 30 mL  30 mL Oral Q4H PRN Motley-Mangrum, Jadeka A, PMHNP   30 mL at 03/28/24 1250   amLODipine  (NORVASC ) tablet 5 mg  5 mg Oral Daily Madaram, Kondal R, MD   5 mg at 03/29/24 0953   buprenorphine-naloxone  (SUBOXONE)  2-0.5 mg per SL tablet 1 tablet  1 tablet Sublingual PRN Ayssa Bentivegna L, PA-C       buprenorphine-naloxone  (SUBOXONE) 8-2 mg per SL tablet 1 tablet  1 tablet Sublingual QHS Madaram, Kondal R, MD       cloNIDine  (CATAPRES ) tablet 0.05 mg  0.05 mg Oral TID Madaram, Kondal R, MD       haloperidol  (HALDOL ) tablet 5 mg  5 mg Oral TID PRN Motley-Mangrum, Jadeka A, PMHNP   5 mg at 03/29/24 0010   And   diphenhydrAMINE  (BENADRYL ) capsule 50 mg  50 mg Oral TID PRN Motley-Mangrum, Jadeka A, PMHNP   50 mg at 03/29/24 0010   haloperidol  lactate (HALDOL ) injection 5 mg  5 mg Intramuscular TID PRN Motley-Mangrum, Jadeka A, PMHNP       And   diphenhydrAMINE  (BENADRYL ) injection 50 mg  50 mg Intramuscular TID PRN Motley-Mangrum, Jadeka A, PMHNP       And   LORazepam  (ATIVAN ) injection 2 mg  2 mg Intramuscular TID PRN Motley-Mangrum, Jadeka A, PMHNP       haloperidol  lactate (HALDOL ) injection 10 mg  10 mg Intramuscular TID PRN Motley-Mangrum, Jadeka A, PMHNP       And   diphenhydrAMINE  (BENADRYL ) injection 50 mg  50 mg Intramuscular TID PRN Motley-Mangrum, Jadeka A, PMHNP       And   LORazepam  (ATIVAN ) injection 2 mg  2 mg Intramuscular TID PRN Motley-Mangrum, Jadeka A, PMHNP   2 mg at 03/29/24 0237   famotidine  (PEPCID ) tablet  20 mg  20 mg Oral BID Motley-Mangrum, Jadeka A, PMHNP   20 mg at 03/29/24 0807   hydrOXYzine  (ATARAX ) tablet 25 mg  25 mg Oral TID PRN Motley-Mangrum, Jadeka A, PMHNP       magnesium  hydroxide (MILK OF MAGNESIA) suspension 30 mL  30 mL Oral Daily PRN Motley-Mangrum, Jadeka A, PMHNP       metoprolol  succinate (TOPROL -XL) 24 hr tablet 50 mg  50 mg Oral Daily Motley-Mangrum, Jadeka A, PMHNP   50 mg at 03/29/24 0807   nicotine  (NICODERM CQ  - dosed in mg/24 hours) patch 14 mg  14 mg Transdermal Daily Jadapalle, Sree, MD   14 mg at 03/29/24 9189   ondansetron  (ZOFRAN ) tablet 4 mg  4 mg Oral TID PRN Shrivastava, Aryendra, MD   4 mg at 03/28/24 2023    Lab Results:  Results for orders placed or performed during the hospital encounter of 03/27/24 (from the past 48 hours)  Glucose, capillary     Status: Abnormal   Collection Time: 03/28/24  5:23 PM  Result Value Ref Range   Glucose-Capillary 179 (H) 70 - 99 mg/dL    Comment: Glucose reference range applies only to samples taken after fasting for at least 8 hours.    Blood Alcohol  level:  Lab Results  Component Value Date   [facility] <15 03/27/2024   ETH <10 07/25/2019    Metabolic Disorder Labs: Lab Results  Component Value Date   HGBA1C 5.9 (H) 11/03/2020   MPG 122.63 11/03/2020   MPG 142.72 07/27/2019   No results found for: PROLACTIN Lab Results  Component Value Date   CHOL 171 03/02/2014   TRIG 212 (H) 03/02/2014   HDL 34 (L) 03/02/2014   CHOLHDL 5.0 03/02/2014   VLDL 42 (H) 03/02/2014   LDLCALC 95 03/02/2014   LDLCALC (H) 10/22/2007    102        Total Cholesterol/HDL:CHD Risk Coronary Heart Disease Risk Table  Men   Women  1/2 Average Risk   3.4   3.3    Physical Findings: AIMS:  , ,  ,  ,    CIWA:    COWS:  COWS Total Score: 1   Psychiatric Specialty Exam:  Presentation  General Appearance:  Disheveled  Eye Contact: Eyes closed throughout much of interview  Speech: Slow  Speech  Volume: Decreased    Mood and Affect  Mood: Irritable   Affect: Congruent; Constricted   Thought Process  Thought Processes: Unable to assess   Orientation:Full (Time, Place and Person)  Thought Content: Unable to assess   Hallucinations: Denies   Ideas of Reference: Unable to assess  Suicidal Thoughts:Suicidal Thoughts: No (Pt on admission reports SI)  Homicidal Thoughts:Homicidal Thoughts: No (Pt has told Act team about HI - towards GF)   Sensorium  Memory: Immediate Poor; Recent Poor  Judgment: Impaired  Insight: Poor   Executive Functions  Concentration: Fair  Attention Span: Fair  Recall: [employer] of Knowledge: Fair  Language: Fair   Psychomotor Activity  Psychomotor Activity: Psychomotor Activity: Psychomotor Retardation; Restlessness  Musculoskeletal: Strength & Muscle Tone: within normal limits Gait & Station: normal Assets  Assets: Desire for Improvement; Housing; Intimacy; Physical Health; Social Support; Manufacturing Systems Engineer    Physical Exam: Physical Exam ROS Blood pressure (!) 157/95, pulse 86, temperature 98 F (36.7 C), temperature source Oral, resp. rate 20, height 5' 7 (1.702 m), weight 83.5 kg, SpO2 99%. Body mass index is 28.82 kg/m.  Diagnosis: Principal Problem:   Schizophrenia (HCC)   PLAN: Safety and Monitoring:  -- Voluntary admission to inpatient psychiatric unit for safety, stabilization and treatment  -- Daily contact with patient to assess and evaluate symptoms and progress in treatment  -- Patient's case to be discussed in multi-disciplinary team meeting  -- Observation Level : q15 minute checks  -- Vital signs:  q12 hours  -- Precautions: suicide, elopement, and assault -- Encouraged patient to participate in unit milieu and in scheduled group therapies  2. Psychiatric Treatment:  Scheduled Medications:  Continue COWS protocol, prn support. Started on Suboxone withdrawal protocol.  PDMP -  last filled opiates on 01/01/23 - 5 day course. Hold Suboxone if Pulse Ox <90%.  Clonidine  0.05 mg three times daily - dose decreased due to sedation Suboxone decreased to once at bedtime due to sedation  Trazodone  discontinued due to sedation   Reported next dose on Jan 12 for Invega  sustenen ?? Invega  Trinza ?? (Need last injection dose and Date)      -- The risks/benefits/side-effects/alternatives to this medication were discussed in detail with the patient and time was given for questions. The patient consents to medication trial.  3. Medical Issues Being Addressed:  HTN - Metoprolol  25 daily xl, Amlodipine  Prn albuterol   Hospitalist consulted due to persistently elevated BP and sedation  4. Discharge Planning:   -- Social work and case management to assist with discharge planning and identification of hospital follow-up needs prior to discharge  -- Estimated LOS: 5-7 days Case discussed with supervising physician, Dr. Ruther, who is in agreement with plan.  Camelia LITTIE Lukes, PA-C 03/29/2024, 3:04 PM

## 2024-03-30 DIAGNOSIS — F1193 Opioid use, unspecified with withdrawal: Secondary | ICD-10-CM

## 2024-03-30 DIAGNOSIS — F19951 Other psychoactive substance use, unspecified with psychoactive substance-induced psychotic disorder with hallucinations: Secondary | ICD-10-CM

## 2024-03-30 LAB — GLUCOSE, CAPILLARY
Glucose-Capillary: 113 mg/dL — ABNORMAL HIGH (ref 70–99)
Glucose-Capillary: 117 mg/dL — ABNORMAL HIGH (ref 70–99)
Glucose-Capillary: 109 mg/dL — ABNORMAL HIGH (ref 70–99)

## 2024-03-30 MED ORDER — PALIPERIDONE PALMITATE ER 234 MG/1.5ML IM SUSY
234.0000 mg | PREFILLED_SYRINGE | Freq: Once | INTRAMUSCULAR | Status: AC
  Administered 2024-03-30: 234 mg via INTRAMUSCULAR
  Filled 2024-03-30: qty 1.5

## 2024-03-30 MED ORDER — PNEUMOCOCCAL 20-VAL CONJ VACC 0.5 ML IM SUSY
0.5000 mL | PREFILLED_SYRINGE | INTRAMUSCULAR | Status: AC
  Administered 2024-03-31: 0.5 mL via INTRAMUSCULAR
  Filled 2024-03-30: qty 0.5

## 2024-03-30 NOTE — Progress Notes (Signed)
" °   03/30/24 0950  Psych Admission Type (Psych Patients Only)  Admission Status Involuntary  Psychosocial Assessment  Patient Complaints None  Eye Contact Brief  Facial Expression Flat  Affect Appropriate to circumstance  Speech Soft  Interaction Isolative  Motor Activity Slow  Appearance/Hygiene In scrubs  Behavior Characteristics Cooperative;Appropriate to situation  Mood Pleasant  Aggressive Behavior  Effect No apparent injury  Thought Process  Coherency WDL  Content WDL  Delusions None reported or observed  Perception WDL  Hallucination None reported or observed  Judgment Poor  Confusion None  Danger to Self  Current suicidal ideation? Denies  Agreement Not to Harm Self Yes  Description of Agreement verbal  Danger to Others  Danger to Others None reported or observed    "

## 2024-03-30 NOTE — Progress Notes (Addendum)
" °  Consult daily PROGRESS NOTE    [name]  FMW:[id number] DOB: 1965-10-15 DOA: 03/27/2024 PCP: Sigrid Deidra Fox, MD  325A/325A-AA  LOS: 3 days    Referring physician: Katrinka Camelia CROME, PA-C  Reason for consult: HTN   Assessment and Plan:   # HTN --BP 182/106 on presentation.  Pt was ordered Toprol  50 mg daily, amlodipine  5 mg daily, and clonidine  0.05 mg TID. --Per home med list, pt was taking Toprol  25 mg PTA. --d/c'ed clonidine  due to it's association with re-bound hypertension. --cont Toprol  at increased 100 mg daily --cont amlodipine  at increased 10 mg  # Drowsiness  --Pt appeared sleepy, but arousable and able to answer questions.  Pt had repeatedly said to staff that he just wanted to rest and sleep.  Since last night, pt had received trazodone , haldol , benadryl  and IV ativan , which may be causing his drowsiness. --pt more alert today --avoiding sedating meds  Addendum on 04/02/23: Observed BP on current regimen of amlodipine  10 mg daily and Toprol  200 mg daily, BP varied between 109 to 140 for the past day, so can't increase or decrease BP meds further.  TRH will sign off, with agreement from primary attending Dr. Ruther.   Subjective and Interval History:  BP better controlled.  Pt reported feeling calm, and less drowsy.   Objective: Vitals:   03/29/24 1642 03/29/24 2147 03/30/24 1302 03/30/24 1618  BP: (!) 146/107 (!) 129/98 (!) 141/88 (!) 126/90  Pulse: 100 94 85 85  Resp:   17   Temp:   98.5 F (36.9 C)   TempSrc:   Oral   SpO2: 96% 100% 100% 100%  Weight:      Height:       No intake or output data in the 24 hours ending 03/30/24 2058 Filed Weights   03/27/24 1700  Weight: 83.5 kg    Examination:   Constitutional: NAD, AAOx3 HEENT: conjunctivae and lids normal, EOMI CV: No cyanosis.   RESP: normal respiratory effort, on RA Neuro: II - XII grossly intact.   Psych: Normal mood and affect.  Appropriate judgement and reason   Data  Reviewed: I have personally reviewed labs and imaging studies  Time spent: 35 minutes  Ellouise Haber, MD [facility] If 7PM-7AM, please contact night-coverage 03/30/2024, 8:58 PM   "

## 2024-03-30 NOTE — Plan of Care (Signed)
  Problem: Education: Goal: Mental status will improve Outcome: Progressing Goal: Verbalization of understanding the information provided will improve Outcome: Progressing   Problem: Activity: Goal: Interest or engagement in activities will improve Outcome: Progressing Goal: Sleeping patterns will improve Outcome: Progressing   Problem: Coping: Goal: Ability to verbalize frustrations and anger appropriately will improve Outcome: Progressing

## 2024-03-30 NOTE — Group Note (Signed)
 Recreation Therapy Group Note   Group Topic:General Recreation  Group Date: 03/30/2024 Start Time: 1445 End Time: 1515 Facilitators: Celestia Jeoffrey BRAVO, LRT, CTRS Location: Courtyard  Group Description: [employer]. LRT and patients played games of basketball, drew with chalk, and played corn hole while outside in the courtyard while getting fresh air and sunlight. Music was being played in the background. LRT and peers conversed about different games they have played before, what they do in their free time and anything else that is on their minds. LRT encouraged pts to drink water after being outside, sweating and getting their heart rate up.  Goal Area(s) Addressed: Patient will build on frustration tolerance skills. Patients will partake in a competitive play game with peers. Patients will gain knowledge of new leisure interest/hobby.    Affect/Mood: Flat   Participation Level: Minimal    Clinical Observations/Individualized Feedback: [name] came outside for 5 minutes and asked LRT if smoking was allowed outside. LRT shared that smoking is not allowed and pt went back inside.   Plan: Continue to engage patient in RT group sessions 2-3x/week.   Jeoffrey BRAVO Celestia, LRT, CTRS 03/30/2024 4:49 PM

## 2024-03-30 NOTE — Group Note (Signed)
 [facility] LCSW Group Therapy Note   Group Date: 03/30/2024 Start Time: 1000 End Time: 1100   Type of Therapy/Topic:  Group Therapy:  Balance in Life  Participation Level:  Minimal   Description of Group:    This group will address the concept of balance and how it feels and looks when one is unbalanced. Patients will be encouraged to process areas in their lives that are out of balance, and identify reasons for remaining unbalanced. Facilitators will guide patients utilizing problem- solving interventions to address and correct the stressor making their life unbalanced. Understanding and applying boundaries will be explored and addressed for obtaining  and maintaining a balanced life. Patients will be encouraged to explore ways to assertively make their unbalanced needs known to significant others in their lives, using other group members and facilitator for support and feedback.  Therapeutic Goals: Patient will identify two or more emotions or situations they have that consume much of in their lives. Patient will identify signs/triggers that life has become out of balance:  Patient will identify two ways to set boundaries in order to achieve balance in their lives:  Patient will demonstrate ability to communicate their needs through discussion and/or role plays  Summary of Patient Progress:    Patients engaged in meaningful discussion around their New Years goals, plans, and expectations. Participants completed a reflective writing activity that invited them to explore something they hoped to overcome, release, gain clarity about, or further examine. They then shared with one another, offering thoughtful feedback, affirmations, validation, and a supportive space for exploration. The patient was open to feedback, actively engaged with others, and contributed to a safe and welcoming environment for peers to share. The patient remained a supportive presence throughout the group.     Therapeutic  Modalities:   Cognitive Behavioral Therapy Solution-Focused Therapy Assertiveness Training   Alveta CHRISTELLA Kerns, LCSW

## 2024-03-30 NOTE — Group Note (Signed)
 Date:  03/30/2024 Time:  8:45 PM  Group Topic/Focus:  Conflict Resolution:   The focus of this group is to discuss the conflict resolution process and how it may be used upon discharge.    Participation Level:  None  Participation Quality:  Appropriate  Affect:  Depressed  Cognitive:  Alert  Insight: None  Engagement in Group:  None  Modes of Intervention:  Support  Additional Comments:  Pt entered group 10-13 min before group close.  Cristine Daw L 03/30/2024, 8:45 PM

## 2024-03-30 NOTE — Group Note (Signed)
 Date:  03/30/2024 Time:  10:47 AM  Group Topic/Focus:  MTH went over the rules and expectations on this unit for the patients, Identified who the nurses and techs were while also answering patient questions.    Participation Level:  Did Not Attend  [name] 03/30/2024, 10:47 AM

## 2024-03-30 NOTE — Group Note (Signed)
 Recreation Therapy Group Note   Group Topic:Communication  Group Date: 03/30/2024 Start Time: 1410 End Time: 1445 Facilitators: Celestia Jeoffrey BRAVO, LRT, CTRS Location: Craft Room  Group Description: Resolution Charades. Each group member writes down a New Years resolution theyre considering (e.g., spend more time outdoors or practice mindfulness) onto a small slip of paper. Resolutions are folded and placed in a bowl. One person picks a resolution and acts it out without words while the group guesses. Once the resolution is guessed, the group discusses how achieving this goal could be meaningful.  Goal Area(s) Addressed:  [employer] and self-expression. Create a supportive space for sharing personal goals. Encourage reflection on individual aspirations. Nonverbal communication. Active listening, teamwork and team building.   Affect/Mood: N/A   Participation Level: Did not attend    Clinical Observations/Individualized Feedback: Patient did not attend.  Plan: Continue to engage patient in RT group sessions 2-3x/week.   Jeoffrey BRAVO Celestia, LRT, CTRS 03/30/2024 4:05 PM

## 2024-03-30 NOTE — Group Note (Signed)
 Date:  03/30/2024 Time:  4:04 AM  Group Topic/Focus:  Goals Group:   The focus of this group is to help patients establish daily goals to achieve during treatment and discuss how the patient can incorporate goal setting into their daily lives to aide in recovery. Wrap-Up Group:   The focus of this group is to help patients review their daily goal of treatment and discuss progress on daily workbooks.    Pt did not attend group.  [name] L 03/30/2024, 4:04 AM

## 2024-03-31 DIAGNOSIS — F1990 Other psychoactive substance use, unspecified, uncomplicated: Secondary | ICD-10-CM

## 2024-03-31 DIAGNOSIS — F209 Schizophrenia, unspecified: Secondary | ICD-10-CM | POA: Diagnosis not present

## 2024-03-31 MED ORDER — METOPROLOL SUCCINATE ER 25 MG PO TB24
200.0000 mg | ORAL_TABLET | Freq: Every day | ORAL | Status: DC
  Administered 2024-04-01 – 2024-04-02 (×2): 200 mg via ORAL
  Filled 2024-03-31 (×3): qty 8

## 2024-03-31 MED ORDER — METOPROLOL SUCCINATE ER 25 MG PO TB24
100.0000 mg | ORAL_TABLET | Freq: Once | ORAL | Status: AC
  Administered 2024-03-31: 100 mg via ORAL
  Filled 2024-03-31: qty 4

## 2024-03-31 NOTE — Plan of Care (Signed)
 [name] is a 59 y.o. male patient. No diagnosis found. Past Medical History:  Diagnosis Date   Ambulates with cane    straight cane   Anxiety    Arthritis    Asthma    Depression    Duodenal adenoma    GERD (gastroesophageal reflux disease)    Headache    Hepatitis    Hepatitis C -   Hypertension    Pneumonia    hx   Post-traumatic osteoarthritis of left ankle    Pre-diabetes    hgba1c on 09/2020 was 5.9   Presence of right artificial knee joint 12/17/2016   Schizophrenia (HCC) 07/2020   from [facility]   Stroke [facility])    2009-or10   Substance abuse (HCC) 07/2020   heroin abuse from [facility]   Type 2 diabetes mellitus (HCC) 07/09/2015   Current Facility-Administered Medications  Medication Dose Route Frequency Provider Last Rate Last Admin   acetaminophen  (TYLENOL ) tablet 650 mg  650 mg Oral Q6H PRN Motley-Mangrum, Jadeka A, PMHNP   650 mg at 03/31/24 0032   alum & mag hydroxide-simeth (MAALOX/MYLANTA) 200-200-20 MG/5ML suspension 30 mL  30 mL Oral Q4H PRN Motley-Mangrum, Jadeka A, PMHNP   30 mL at 03/28/24 1250   amLODipine  (NORVASC ) tablet 10 mg  10 mg Oral Daily Awanda City, MD   10 mg at 03/30/24 9045   buprenorphine-naloxone  (SUBOXONE) 8-2 mg per SL tablet 1 tablet  1 tablet Sublingual QHS Madaram, Kondal R, MD   1 tablet at 03/30/24 2104   haloperidol  (HALDOL ) tablet 5 mg  5 mg Oral TID PRN Motley-Mangrum, Jadeka A, PMHNP   5 mg at 03/29/24 0010   And   diphenhydrAMINE  (BENADRYL ) capsule 50 mg  50 mg Oral TID PRN Motley-Mangrum, Jadeka A, PMHNP   50 mg at 03/29/24 0010   haloperidol  lactate (HALDOL ) injection 5 mg  5 mg Intramuscular TID PRN Motley-Mangrum, Jadeka A, PMHNP       And   diphenhydrAMINE  (BENADRYL ) injection 50 mg  50 mg Intramuscular TID PRN Motley-Mangrum, Jadeka A, PMHNP       And   LORazepam  (ATIVAN ) injection 2 mg  2 mg Intramuscular TID PRN Motley-Mangrum, Jadeka A, PMHNP       haloperidol  lactate (HALDOL ) injection 10 mg  10 mg Intramuscular TID PRN  Motley-Mangrum, Jadeka A, PMHNP       And   diphenhydrAMINE  (BENADRYL ) injection 50 mg  50 mg Intramuscular TID PRN Motley-Mangrum, Jadeka A, PMHNP       And   LORazepam  (ATIVAN ) injection 2 mg  2 mg Intramuscular TID PRN Motley-Mangrum, Jadeka A, PMHNP   2 mg at 03/29/24 0237   famotidine  (PEPCID ) tablet 20 mg  20 mg Oral BID Motley-Mangrum, Jadeka A, PMHNP   20 mg at 03/30/24 1726   hydrALAZINE  (APRESOLINE ) injection 10 mg  10 mg Intravenous Q6H PRN Awanda City, MD       hydrOXYzine  (ATARAX ) tablet 25 mg  25 mg Oral TID PRN Motley-Mangrum, Jadeka A, PMHNP       magnesium  hydroxide (MILK OF MAGNESIA) suspension 30 mL  30 mL Oral Daily PRN Motley-Mangrum, Jadeka A, PMHNP       metoprolol  succinate (TOPROL -XL) 24 hr tablet 100 mg  100 mg Oral Daily Awanda City, MD   100 mg at 03/30/24 0954   nicotine  (NICODERM CQ  - dosed in mg/24 hours) patch 14 mg  14 mg Transdermal Daily Jadapalle, Sree, MD   14 mg at 03/30/24 [phone]  ondansetron  (ZOFRAN ) tablet 4 mg  4 mg Oral TID PRN Shrivastava, Aryendra, MD   4 mg at 03/28/24 2023   pneumococcal 20-valent conjugate vaccine (PREVNAR 20) injection 0.5 mL  0.5 mL Intramuscular Tomorrow-1000 Donnelly Mellow, MD       Allergies[1] Principal Problem:   Schizophrenia (HCC) Active Problems:   Other psychoactive substance use, unspecified with 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 rate 17, height 5' 7 (1.702 m), weight 83.5 kg, SpO2 100%.   Hovanes Hymas B Laurenashley Viar 03/31/2024      [1]  Allergies Allergen Reactions   Levofloxacin  Hives and Other (See Comments)    Confusion, hives, and itching (patient has tolerated ciprofloxacin )   Chlorhexidine  Itching and Rash

## 2024-03-31 NOTE — Group Note (Signed)
 Recreation Therapy Group Note   Group Topic:Leisure Education  Group Date: 03/31/2024 Start Time: 1020 End Time: 1115 Facilitators: Celestia Jeoffrey BRAVO, LRT, CTRS Location: Craft Room  Group Description: Leisure. Patients were given the option to choose from journaling, coloring, drawing, making origami, playing with playdoh, listening to music or singing karaoke. LRT and pts discussed the meaning of leisure, the importance of participating in leisure during their free time/when they're outside of the hospital, as well as how our leisure interests can also serve as coping skills.   Goal Area(s) Addressed:  Patient will identify a current leisure interest.  Patient will learn the definition of leisure. Patient will practice making a positive decision. Patient will have the opportunity to try a new leisure activity. Patient will communicate with peers and LRT.    Affect/Mood: N/A   Participation Level: Did not attend    Clinical Observations/Individualized Feedback: Patient did not attend.  Plan: Continue to engage patient in RT group sessions 2-3x/week.   Jeoffrey BRAVO Celestia, LRT, CTRS 03/31/2024 11:44 AM

## 2024-03-31 NOTE — Progress Notes (Signed)
 Pt calm and pleasant during assessment denying SI/HI/AVH. Pt presents with a brighter affect than when this writer had him earlier this admission. Pt did refuse his Suboxone stating, I don't like the way it makes me feel. Pt given education, support, and encouragement to be active in his treatment plan. Pt being monitored Q 15 minutes for safety per unit protocol, remains safe on the unit

## 2024-03-31 NOTE — B[facility] Counselor (Signed)
 CSW met with pt briefly to discussion aftercare/discharge plans. Pt reported that he will be returning home and that he will need transportation. Transportation waiver was completed. Pt was informed that his ACTT team will see him on the next week day after his discharge. He voiced understanding. Pt reported that he has clothes to wear home at discharge. He reported that he is a smoker and would like to quit. CSW assisted pt with completion of the [facility] referral form. He denied any use of substances or need for services to help quit. UDS was positive for cocaine and fentanyl . CSW confirmed pt address. No other concerns expressed. Contact ended without incident.   [employer] form was faxed. Taxi voucher was placed on pt chart.   Nadara SAUNDERS. Chaim, MSW, LCSW, LCAS 03/31/2024 3:20 PM

## 2024-03-31 NOTE — Progress Notes (Signed)
 Patient provided with Pneumonia Vaccine information. This clinical research associate was notified by Pharmacy that the cards are no longer given out.

## 2024-03-31 NOTE — Group Note (Signed)
 Date:  03/31/2024 Time:  10:14 AM  Group Topic/Focus:  Goals Group:   The focus of this group is to help patients establish daily goals to achieve during treatment and discuss how the patient can incorporate goal setting into their daily lives to aide in recovery.    Participation Level:  Did Not Attend   [name] June 03/31/2024, 10:14 AM

## 2024-03-31 NOTE — Group Note (Signed)
 Date:  03/31/2024 Time:  9:47 PM  Group Topic/Focus:  Coping With Mental Health Crisis:   The purpose of this group is to help patients identify strategies for coping with mental health crisis.  Group discusses possible causes of crisis and ways to manage them effectively. Managing Feelings:   The focus of this group is to identify what feelings patients have difficulty handling and develop a plan to handle them in a healthier way upon discharge.    Participation Level:  Active  Participation Quality:  Appropriate  Affect:  Appropriate  Cognitive:  Appropriate  Insight: Appropriate  Engagement in Group:  Engaged  Modes of Intervention:  Education  Additional Comments:    Gretta Samons L 03/31/2024, 9:47 PM

## 2024-03-31 NOTE — Plan of Care (Signed)

## 2024-03-31 NOTE — Group Note (Signed)
 Date:  03/31/2024 Time:  6:59 PM  Group Topic/Focus:  Wellness Toolbox:   The focus of this group is to discuss various aspects of wellness, balancing those aspects and exploring ways to increase the ability to experience wellness.  Patients will create a wellness toolbox for use upon discharge.    Participation Level:  Active  Participation Quality:  Appropriate  Affect:  Appropriate  Cognitive:  Appropriate  Insight: Appropriate  Engagement in Group:  Engaged  Modes of Intervention:  Activity  Additional Comments:    Camellia HERO Kahli Fitzgerald 03/31/2024, 6:59 PM

## 2024-03-31 NOTE — Group Note (Signed)
 Recreation Therapy Group Note   Group Topic:Stress Management  Group Date: 03/31/2024 Start Time: 1530 End Time: 1630 Facilitators: Celestia Jeoffrey BRAVO, LRT, CTRS Location: Dayroom  Group Description: Rummy. LRT and pts played games of Rummy. LRT prompted group discussion on the physical signs and symptoms of stress, like those you feel when playing a competitive card game. LRT and pt discussed the physical and mental signs of stress, as well as coping skills to manage them.   Goal Area(s) Addressed: Patient will identify physical symptoms of stress. Patient will identify coping skills for stress. Patient will build frustration tolerance skills.  Patient will increase communication.    Affect/Mood: N/A   Participation Level: Did not attend    Clinical Observations/Individualized Feedback: Patient did not attend.  Plan: Continue to engage patient in RT group sessions 2-3x/week.   Jeoffrey BRAVO Celestia, LRT, CTRS 03/31/2024 5:22 PM

## 2024-03-31 NOTE — Progress Notes (Signed)
" °   03/31/24 1600  Psych Admission Type (Psych Patients Only)  Admission Status Involuntary  Psychosocial Assessment  Patient Complaints Sleep disturbance  Eye Contact Fair  Facial Expression Worried  Affect Appropriate to circumstance  Speech Logical/coherent  Interaction Assertive  Motor Activity Slow  Appearance/Hygiene In scrubs;Unremarkable  Behavior Characteristics Cooperative;Appropriate to situation  Mood Pleasant  Aggressive Behavior  Effect No apparent injury  Thought Process  Coherency WDL  Content WDL  Delusions None reported or observed  Perception WDL  Hallucination None reported or observed  Judgment WDL  Confusion None  Danger to Self  Current suicidal ideation? Denies  Agreement Not to Harm Self Yes  Description of Agreement Verbal  Danger to Others  Danger to Others None reported or observed   Patient stated to this writer that I have to get home to work. I have bills to pay and rent is due. "

## 2024-03-31 NOTE — Progress Notes (Signed)
" °   03/31/24 0349  Psych Admission Type (Psych Patients Only)  Admission Status Involuntary  Psychosocial Assessment  Patient Complaints None  Eye Contact Fair;Brief  Facial Expression Flat;Sad  Affect Appropriate to circumstance  Speech Soft  Interaction Avoidant;Isolative  Motor Activity Slow  Appearance/Hygiene In scrubs  Behavior Characteristics Cooperative;Appropriate to situation  Mood Pleasant  Thought Process  Coherency WDL  Content WDL  Delusions None reported or observed  Perception WDL  Hallucination None reported or observed  Judgment Poor  Confusion None  Danger to Self  Current suicidal ideation? Denies  Agreement Not to Harm Self Yes  Description of Agreement Verbal  Danger to Others  Danger to Others None reported or observed    "

## 2024-04-01 DIAGNOSIS — F209 Schizophrenia, unspecified: Secondary | ICD-10-CM | POA: Diagnosis not present

## 2024-04-01 DIAGNOSIS — F19951 Other psychoactive substance use, unspecified with psychoactive substance-induced psychotic disorder with hallucinations: Secondary | ICD-10-CM

## 2024-04-01 DIAGNOSIS — F1193 Opioid use, unspecified with withdrawal: Secondary | ICD-10-CM | POA: Diagnosis not present

## 2024-04-01 NOTE — Progress Notes (Signed)
 Patient Name: [name] Age: 59 year old male Date of Service: 03/31/2024 Hospital Day: [Inpatient] Primary Psychiatric Diagnoses:  Schizophrenia  Polysubstance Use Disorder  Legal Status: Voluntary Reason for Admission: Suicidal ideation with plan, auditory hallucinations, psychiatric decompensation  SUBJECTIVE  The patient was seen and evaluated on the inpatient unit today. He reports that he is feeling much better following receipt of his Invega  (paliperidone ) long-acting injection. He states that his mood and thinking have improved significantly and expresses a desire to be discharged.  He denies suicidal ideation, homicidal ideation, intent, or plan. He denies auditory or visual hallucinations at this time. He reports no medication side effects. He states his energy and alertness have improved compared to prior days.  INTERVAL HISTORY / HOSPITAL COURSE  [name] initially presented voluntarily to the emergency department reporting a mental breakdown over several weeks, endorsing suicidal ideation with a plan to shoot himself and auditory hallucinations commanding harm to himself and others. He denied access to firearms at the time of evaluation.  He attributed symptom worsening to concerns about being overdue for his monthly Invega  injection. Collateral from his significant other supported improvement with adherence to long-acting antipsychotic treatment. However, collateral from ACTT clarified that his next scheduled injection was not due until January 12, and noted that the patient had recently reported suicidal and homicidal ideation toward his significant other, prompting ED evaluation.  Since admission, the patient has received his Invega  injection, with subsequent improvement in mood, psychotic symptoms, and overall engagement. He is more alert today and interacting appropriately with staff.  MENTAL STATUS EXAM  Appearance: Middle-aged male, appropriately  groomed  Behavior: Calm, cooperative  Speech: Normal rate, rhythm, and volume  Mood: Much better  Affect: Euthymic, congruent  Thought Process: Linear and goal-directed  Thought Content:  Denies suicidal or homicidal ideation  No delusions expressed  Perception: Denies hallucinations  Cognition: Alert and oriented 4  Insight: Improving  Judgment: Improving  SUICIDE & SAFETY RISK ASSESSMENT  Current SI/HI: Denied  Recent SI with plan: Yes (on presentation)  Recent AH commanding harm: Yes (on presentation)  Access to weapons: Denied  Protective Factors: Symptom improvement with medication, inpatient setting, engagement in care  Risk Factors: Chronic psychotic disorder, substance use, recent SI/HI  Overall Risk:  Acute risk: Low at present in structured setting  Chronic risk: Moderate given psychiatric history  MEDICAL ISSUES / INTERVAL MEDICAL MANAGEMENT  Hypertension:  BP on presentation: 182/106  Home medication: Toprol  25 mg daily  Hospital course:  Clonidine  discontinued due to risk of rebound hypertension  Toprol  increased to 100 mg daily  Amlodipine  increased to 10 mg daily  Blood pressures are improving  Drowsiness:  Previously noted sedation likely secondary to trazodone , haloperidol , diphenhydramine , and IV lorazepam   Patient is more alert today  Sedating medications are being avoided  ASSESSMENT  [name] is a 59 year old male with schizophrenia and polysubstance use disorder admitted after endorsing suicidal ideation with plan and command auditory hallucinations. He has shown significant clinical improvement following receipt of his long-acting antipsychotic, with resolution of acute suicidal and psychotic symptoms, improved alertness, and stabilization of blood pressure.  Despite improvement, given the severity of symptoms on presentation, continued short-term inpatient monitoring is warranted to ensure sustained stability and  safe discharge planning.  PLAN  Continue inpatient psychiatric treatment and monitoring.  Continue Invega  long-acting injection per schedule; monitor for side effects.  Continue current antihypertensive regimen with BP monitoring.  Avoid sedating medications when possible.  Continue suicide and safety monitoring.  Coordinate with ACTT and outpatient providers for continuity of care.  Begin discharge planning if stability is maintained.  MEDICAL NECESSITY  Continued inpatient care remains medically necessary due to recent suicidal ideation with plan, command hallucinations, and need to ensure sustained psychiatric and medical stabilization prior to discharge.

## 2024-04-01 NOTE — Progress Notes (Signed)
 [facility] MD Progress Note  04/01/2024 6:37 PM [name]  MRN:  [id number]   Subjective:  Chart reviewed, case discussed in multidisciplinary meeting, patient seen during rounds.   1/3: Patient found in day room during rounds. Patient pleasant and cooperative on contact. Patient expresses that he is ready to go home. He reflects on the fact that he only came here due to needing his injection. He reports that he called his act team and they told him to call 911 and tell them he was suicidal. He reported that they said this is how he would get his injection. He reports he was unaware that it would get him admitted. He denies being suicidal.  He reports he lives at home with his girlfriend, but they have no transportation. He also has a dog named Psychiatrist. He identifies being on disability and engaged with the act team. He denies HI or AVH. He reports feeling better after receiving his injection. As per social work, note, his act team will follow up the day after he is discharged.    Past Psychiatric History: see h&P Family History:  Family History  Adopted: Yes  Problem Relation Age of Onset   Colon cancer Neg Hx    Esophageal cancer Neg Hx    Rectal cancer Neg Hx    Stomach cancer Neg Hx    Social History:  Social History   Substance and Sexual Activity  Alcohol  Use Not Currently   Alcohol /week: 3.0 standard drinks of alcohol    Types: 3 Cans of beer per week   Comment: quit 2015; formerly up to 2 fifths/day     Social History   Substance and Sexual Activity  Drug Use Not Currently   Types: Marijuana, Heroin, Cocaine   Comment: Heroin and marijuana in 07/2020    Social History   Socioeconomic History   Marital status: Significant Other    Spouse name: Not on file   Number of children: Not on file   Years of education: Not on file   Highest education level: Not on file  Occupational History   Not on file  Tobacco Use   Smoking status: Heavy Smoker    Current packs/day: 1.50     Average packs/day: 1.5 packs/day for 38.0 years (57.0 ttl pk-yrs)    Types: Cigarettes   Smokeless tobacco: Never   Tobacco comments:    onset age 48; upto 1.5 ppd  Vaping Use   Vaping status: Never Used  Substance and Sexual Activity   Alcohol  use: Not Currently    Alcohol /week: 3.0 standard drinks of alcohol     Types: 3 Cans of beer per week    Comment: quit 2015; formerly up to 2 fifths/day   Drug use: Not Currently    Types: Marijuana, Heroin, Cocaine    Comment: Heroin and marijuana in 07/2020   Sexual activity: Yes    Comment: declined condoms  Other Topics Concern   Not on file  Social History Narrative   Not on file   Social Drivers of Health   Tobacco Use: High Risk (03/27/2024)   Patient History    Smoking Tobacco Use: Heavy Smoker    Smokeless Tobacco Use: Never    Passive Exposure: Not on file  Financial Resource Strain: Not on file  Food Insecurity: No Food Insecurity (03/27/2024)   Epic    Worried About Radiation Protection Practitioner of Food in the Last Year: Never true    [employer] of Food in the Last Year: Never  true  Transportation Needs: Unmet Transportation Needs (03/27/2024)   Epic    Lack of Transportation (Medical): Yes    Lack of Transportation (Non-Medical): No  Physical Activity: Not on file  Stress: Not on file  Social Connections: Not on file  Depression (EYV7-0): Not on file  Alcohol  Screen: Low Risk (03/27/2024)   Alcohol  Screen    Last Alcohol  Screening Score (AUDIT): 0  Housing: Unknown (03/27/2024)   Epic    Unable to Pay for Housing in the Last Year: No    Number of Times Moved in the Last Year: Not on file    Homeless in the Last Year: No  Utilities: Not At Risk (03/27/2024)   Epic    Threatened with loss of utilities: No  Health Literacy: Not on file   Past Medical History:  Past Medical History:  Diagnosis Date   Ambulates with cane    straight cane   Anxiety    Arthritis    Asthma    Depression    Duodenal adenoma    GERD  (gastroesophageal reflux disease)    Headache    Hepatitis    Hepatitis C -   Hypertension    Pneumonia    hx   Post-traumatic osteoarthritis of left ankle    Pre-diabetes    hgba1c on 09/2020 was 5.9   Presence of right artificial knee joint 12/17/2016   Schizophrenia (HCC) 07/2020   from [facility]   [facility]roke [facility])    2009-or10   Substance abuse (HCC) 07/2020   heroin abuse from [facility]   Type 2 diabetes mellitus (HCC) 07/09/2015    Past Surgical History:  Procedure Laterality Date   ANKLE ARTHROSCOPY Left 10/20/2017   Procedure: ANKLE ARTHROSCOPY;  Surgeon: Harden Jerona GAILS, MD;  Location: [facility] OR;  Service: Orthopedics;  Laterality: Left;   ANKLE FUSION Left 10/20/2017   ANKLE FUSION Left 10/20/2017   Procedure: LEFT ANKLE FUSION;  Surgeon: Harden Jerona GAILS, MD;  Location: [facility] OR;  Service: Orthopedics;  Laterality: Left;   BACK SURGERY     ESOPHAGOGASTRODUODENOSCOPY Left 12/05/2013   Procedure: ESOPHAGOGASTRODUODENOSCOPY (EGD);  Surgeon: Elsie Cree, MD;  Location: [facility];  Service: Endoscopy;  Laterality: Left;   ESOPHAGOGASTRODUODENOSCOPY (EGD) WITH PROPOFOL  N/A 02/20/2015   Procedure: ESOPHAGOGASTRODUODENOSCOPY (EGD) WITH PROPOFOL ;  Surgeon: Jerrell Sol, MD;  Location: [facility];  Service: Endoscopy;  Laterality: N/A;   HARDWARE REMOVAL Left 01/29/2018   Procedure: HARDWARE REMOVAL LEFT ANKLE;  Surgeon: Harden Jerona GAILS, MD;  Location: [facility] OR;  Service: Orthopedics;  Laterality: Left;   IR LUMBAR DISC ASPIRATION W/IMG GUIDE  01/08/2020   JOINT REPLACEMENT     THORACIC LAMINECTOMY FOR EPIDURAL ABSCESS N/A 11/03/2020   Procedure: LUMBAR THREE AND LUMBAR  FOUR LAMINECTOMY FOR EVACUATION EPIDURAL ABSCESS;  Surgeon: Cheryle Debby LABOR, MD;  Location: [facility] OR;  Service: Neurosurgery;  Laterality: N/A;   TOOTH EXTRACTION N/A 01/01/2023   Procedure: EXTRACTION TEETH NUMBER EIGHTEEN,NINETEEN,TWENTY,TWENTY-TWO,TWENTY-SEVEN,TWENTY-EIGHT,TWENTY-NINE,THIRTY-ONE, ALVEOLOPLASTY, REDUCTION  HYPERPLASTIC LEFT MAXILLARY OSSEOUS TUBEROSITY, REMOVAL BILATERAL MANDIBULAR LINGUAL TORI;  Surgeon: Sheryle Hamilton, DMD;  Location: [facility] OR;  Service: Oral Surgery;  Laterality: N/A;   TOTAL KNEE ARTHROPLASTY Right 07/21/2016   Procedure: TOTAL KNEE ARTHROPLASTY;  Surgeon: Hamilton Cordella Hutchinson, MD;  Location: [facility] OR;  Service: Orthopedics;  Laterality: Right;   UPPER GASTROINTESTINAL ENDOSCOPY      Current Medications: Current Facility-Administered Medications  Medication Dose Route Frequency Provider Last Rate Last Admin   acetaminophen  (TYLENOL ) tablet 650 mg  650 mg Oral Q6H  PRN Motley-Mangrum, Jadeka A, PMHNP   650 mg at 03/31/24 0032   alum & mag hydroxide-simeth (MAALOX/MYLANTA) 200-200-20 MG/5ML suspension 30 mL  30 mL Oral Q4H PRN Motley-Mangrum, Jadeka A, PMHNP   30 mL at 03/28/24 1250   amLODipine  (NORVASC ) tablet 10 mg  10 mg Oral Daily Awanda City, MD   10 mg at 04/01/24 9165   buprenorphine -naloxone  (SUBOXONE ) 8-2 mg per SL tablet 1 tablet  1 tablet Sublingual QHS Madaram, Kondal R, MD   1 tablet at 03/30/24 2104   haloperidol  (HALDOL ) tablet 5 mg  5 mg Oral TID PRN Motley-Mangrum, Jadeka A, PMHNP   5 mg at 03/29/24 0010   And   diphenhydrAMINE  (BENADRYL ) capsule 50 mg  50 mg Oral TID PRN Motley-Mangrum, Jadeka A, PMHNP   50 mg at 03/31/24 2115   haloperidol  lactate (HALDOL ) injection 5 mg  5 mg Intramuscular TID PRN Motley-Mangrum, Jadeka A, PMHNP       And   diphenhydrAMINE  (BENADRYL ) injection 50 mg  50 mg Intramuscular TID PRN Motley-Mangrum, Jadeka A, PMHNP       And   LORazepam  (ATIVAN ) injection 2 mg  2 mg Intramuscular TID PRN Motley-Mangrum, Jadeka A, PMHNP       haloperidol  lactate (HALDOL ) injection 10 mg  10 mg Intramuscular TID PRN Motley-Mangrum, Jadeka A, PMHNP       And   diphenhydrAMINE  (BENADRYL ) injection 50 mg  50 mg Intramuscular TID PRN Motley-Mangrum, Jadeka A, PMHNP       And   LORazepam  (ATIVAN ) injection 2 mg  2 mg Intramuscular TID PRN Motley-Mangrum, Jadeka  A, PMHNP   2 mg at 03/29/24 0237   famotidine  (PEPCID ) tablet 20 mg  20 mg Oral BID Motley-Mangrum, Jadeka A, PMHNP   20 mg at 04/01/24 1731   hydrALAZINE  (APRESOLINE ) injection 10 mg  10 mg Intravenous Q6H PRN Awanda City, MD       hydrOXYzine  (ATARAX ) tablet 25 mg  25 mg Oral TID PRN Motley-Mangrum, Jadeka A, PMHNP   25 mg at 03/31/24 2115   magnesium  hydroxide (MILK OF MAGNESIA) suspension 30 mL  30 mL Oral Daily PRN Motley-Mangrum, Jadeka A, PMHNP       metoprolol  succinate (TOPROL -XL) 24 hr tablet 200 mg  200 mg Oral Daily Awanda City, MD   200 mg at 04/01/24 1003   nicotine  (NICODERM CQ  - dosed in mg/24 hours) patch 14 mg  14 mg Transdermal Daily Jadapalle, Sree, MD   14 mg at 04/01/24 0836   ondansetron  (ZOFRAN ) tablet 4 mg  4 mg Oral TID PRN Shrivastava, Aryendra, MD   4 mg at 03/28/24 2023    Lab Results: No results found for this or any previous visit (from the past 48 hours).  Blood Alcohol  level:  Lab Results  Component Value Date   [facility] <15 03/27/2024   ETH <10 07/25/2019    Metabolic Disorder Labs: Lab Results  Component Value Date   HGBA1C 5.9 (H) 11/03/2020   MPG 122.63 11/03/2020   MPG 142.72 07/27/2019   No results found for: PROLACTIN Lab Results  Component Value Date   CHOL 171 03/02/2014   TRIG 212 (H) 03/02/2014   HDL 34 (L) 03/02/2014   CHOLHDL 5.0 03/02/2014   VLDL 42 (H) 03/02/2014   LDLCALC 95 03/02/2014   LDLCALC (H) 10/22/2007    102        Total Cholesterol/HDL:CHD Risk Coronary Heart Disease Risk Table  Men   Women  1/2 Average Risk   3.4   3.3    Physical Findings: AIMS:  , ,  ,  ,    CIWA:  CIWA-Ar Total: 0 COWS:  COWS Total Score: 0   Psychiatric Specialty Exam:  Presentation  General Appearance:  Casual  Eye Contact: Fair  Speech: Clear and Coherent; Normal Rate  Speech Volume: Normal    Mood and Affect  Mood: Euthymic  Affect: Appropriate; Congruent   Thought Process  Thought Processes: Goal  Directed; Linear; Coherent  Orientation:Full (Time, Place and Person)  Thought Content:WDL  Hallucinations:Hallucinations: None  Ideas of Reference:None  Suicidal Thoughts:Suicidal Thoughts: No  Homicidal Thoughts:Homicidal Thoughts: No   Sensorium  Memory: Immediate Fair; Recent Fair; Remote Fair  Judgment: Fair  Insight: Fair   Art Therapist  Concentration: Fair  Attention Span: Fair  Recall: [employer] of Knowledge: Fair  Language: Fair   Psychomotor Activity  Psychomotor Activity: Psychomotor Activity: Normal  Musculoskeletal: Strength & Muscle Tone: within normal limits Gait & Station: normal Assets  Assets: Desire for Improvement; Housing; Health And Safety Inspector; Intimacy; Social Support; Manufacturing Systems Engineer    Physical Exam: Physical Exam Vitals and nursing note reviewed.  Constitutional:      Appearance: Normal appearance.  Pulmonary:     Effort: Pulmonary effort is normal.  Neurological:     Mental Status: He is alert and oriented to person, place, and time.  Psychiatric:        Mood and Affect: Mood normal.        Behavior: Behavior normal.    Review of Systems  Respiratory:  Negative for shortness of breath.   Cardiovascular:  Negative for chest pain.  Gastrointestinal:  Negative for diarrhea, nausea and vomiting.  Psychiatric/Behavioral:  Negative for depression, hallucinations and suicidal ideas. The patient is not nervous/anxious.   All other systems reviewed and are negative.  Blood pressure 109/87, pulse 79, temperature 97.8 F (36.6 C), temperature source Oral, resp. rate 19, height 5' 7 (1.702 m), weight 83.5 kg, SpO2 100%. Body mass index is 28.82 kg/m.  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 Psychiatric Treatment:  Scheduled Medications: Invega  injection Monthly  Nicotine  patch Suboxone  nightly  Update needed for metabolic labs; orders placed for tomorrow    -- The risks/benefits/side-effects/alternatives to this medication were discussed in detail with the patient and time was given for questions. The patient consents to medication trial.  3. Medical Issues Being Addressed:  Hospitalist consulted due to ongoing elevated blood pressure. Amlodipine  10 mg Pepcid  Metoprolol  200 mg 24 hour    4. Discharge Planning:   -- Social work and case management to assist with discharge planning and identification of hospital follow-up needs prior to discharge  -- Estimated LOS: 5-7 days Likely discharge early next week after coordination with Act Team  Keanu Lesniak, NP 04/01/2024, 6:37  PM

## 2024-04-01 NOTE — Plan of Care (Signed)
   Problem: Education: Goal: Emotional status will improve Outcome: Progressing Goal: Mental status will improve Outcome: Progressing

## 2024-04-01 NOTE — Group Note (Signed)
 Date:  04/01/2024 Time:  10:57 PM  Group Topic/Focus:  Wrap-Up Group:   The focus of this group is to help patients review their daily goal of treatment and discuss progress on daily workbooks.      Additional Comments:  DIDN'T ATRTEND   Kerri Katz 04/01/2024, 10:57 PM

## 2024-04-01 NOTE — Progress Notes (Signed)
" °   04/01/24 0830  Psych Admission Type (Psych Patients Only)  Admission Status Involuntary  Psychosocial Assessment  Patient Complaints None  Eye Contact Fair  Facial Expression Flat  Affect Appropriate to circumstance  Speech Logical/coherent  Interaction Assertive  Motor Activity Slow  Appearance/Hygiene Unremarkable  Behavior Characteristics Cooperative  Mood Pleasant  Thought Process  Coherency WDL  Content WDL  Delusions None reported or observed  Perception WDL  Hallucination None reported or observed  Judgment WDL  Confusion None  Danger to Self  Current suicidal ideation? Denies  Agreement Not to Harm Self Yes  Description of Agreement verbal  Danger to Others  Danger to Others None reported or observed    "

## 2024-04-01 NOTE — Group Note (Signed)
 Date:  04/01/2024 Time:  2:07 PM  Group Topic/Focus:  Goals Group:   The focus of this group is to help patients establish daily goals to achieve during treatment and discuss how the patient can incorporate goal setting into their daily lives to aide in recovery.    Participation Level:  Did Not Attend   Camellia HERO Dillin Lofgren 04/01/2024, 2:07 PM

## 2024-04-02 DIAGNOSIS — F1193 Opioid use, unspecified with withdrawal: Secondary | ICD-10-CM | POA: Diagnosis not present

## 2024-04-02 DIAGNOSIS — F19951 Other psychoactive substance use, unspecified with psychoactive substance-induced psychotic disorder with hallucinations: Secondary | ICD-10-CM | POA: Diagnosis not present

## 2024-04-02 DIAGNOSIS — F209 Schizophrenia, unspecified: Secondary | ICD-10-CM | POA: Diagnosis not present

## 2024-04-02 LAB — LIPID PANEL
Cholesterol: 112 mg/dL (ref 0–200)
HDL: 32 mg/dL — ABNORMAL LOW
LDL Cholesterol: 62 mg/dL (ref 0–99)
Total CHOL/HDL Ratio: 3.5 ratio
Triglycerides: 91 mg/dL
VLDL: 18 mg/dL (ref 0–40)

## 2024-04-02 LAB — TSH: TSH: 1.64 u[IU]/mL (ref 0.350–4.500)

## 2024-04-02 LAB — HEMOGLOBIN A1C
Hgb A1c MFr Bld: 5.5 % (ref 4.8–5.6)
Mean Plasma Glucose: 111.15 mg/dL

## 2024-04-02 MED ORDER — HYDROXYZINE HCL 50 MG PO TABS
50.0000 mg | ORAL_TABLET | Freq: Three times a day (TID) | ORAL | Status: DC | PRN
  Administered 2024-04-02: 50 mg via ORAL
  Filled 2024-04-02: qty 1

## 2024-04-02 NOTE — [facility] Suicide Risk Assessment (Incomplete)
 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)   Opioid use, unspecified with withdrawal (HCC)   Total Time spent with patient: 30 minutes  Musculoskeletal: Strength & Muscle Tone: within normal limits Gait & Station: uses walker Patient leans: N/A  Psychiatric Specialty Exam  Presentation  General Appearance:  Casual  Eye Contact: Fair  Speech: Clear and Coherent; Normal Rate  Speech Volume: Normal  Handedness:No data recorded  Mood and Affect  Mood: Euthymic  Duration of Depression Symptoms: Greater than two weeks  Affect: Appropriate; Congruent   Thought Process  Thought Processes: Goal Directed; Linear; Coherent  Descriptions of Associations:Intact  Orientation:Full (Time, Place and Person)  Thought Content:WDL  History of Schizophrenia/Schizoaffective disorder:Yes  Duration of Psychotic Symptoms:Greater than six months  Hallucinations:Hallucinations: None  Ideas of Reference:None  Suicidal Thoughts:Suicidal Thoughts: No  Homicidal Thoughts:Homicidal Thoughts: No   Sensorium  Memory: Immediate Fair; Recent Fair; Remote Fair  Judgment: Fair  Insight: Fair   Art Therapist  Concentration: Fair  Attention Span: Fair  Recall: [employer] of Knowledge: Fair  Language: Fair   Psychomotor Activity  Psychomotor Activity: Psychomotor Activity: Normal   Assets  Assets: Desire for Improvement; Housing; Financial Resources/Insurance; Intimacy; Social Support; Communication Skills   Sleep  Sleep: Sleep: Fair  Estimated Sleeping Duration (Last 24 Hours): 7.75-9.00 hours  Physical Exam: Physical Exam Vitals and nursing note reviewed.  Constitutional:      Appearance:  Normal appearance.  Pulmonary:     Effort: Pulmonary effort is normal.  Neurological:     Mental Status: He is alert and oriented to person, place, and time.  Psychiatric:        Mood and Affect: Mood normal.        Behavior: Behavior normal.        Thought Content: Thought content normal.    Review of Systems  Respiratory:  Negative for shortness of breath.   Cardiovascular:  Negative for chest pain.  Psychiatric/Behavioral:  Negative for depression, hallucinations and suicidal ideas. The patient is not nervous/anxious.   All other systems reviewed and are negative.  Blood pressure (!) 133/96, pulse 85, temperature 98 F (36.7 C), temperature source Oral, resp. rate 18, height 5' 7 (1.702 m), weight 83.5 kg, SpO2 98%. Body mass index is 28.82 kg/m.  Mental Status Per Nursing Assessment::   On Admission:  Suicidal ideation indicated by others  Demographic Factors:  Male and Low socioeconomic status  Loss Factors: Financial problems/change in socioeconomic status  Historical Factors: NA  Risk Reduction Factors:   Living with another person, especially a relative ACT team  Continued Clinical Symptoms:  Previous Psychiatric Diagnoses and Treatments  Cognitive Features That Contribute To Risk:  None    Suicide Risk:  Minimal: No identifiable suicidal ideation.  Patients presenting with no risk factors but with morbid ruminations; Ubaldo Daywalt be classified as minimal risk based on the severity of the depressive symptoms   Follow-up Information     [facility] Follow up.   Why: Your ACTT services will resume on Monday, 04/03/2024 after 11AM. Contact information: [address] [place] [facility] [address] [phone]                 Plan Of Care/Follow-up recommendations:  Follow up with the ACT team - they should check in day of/day after discharge.  Geroge Gilliam, NP 04/02/2024, 8:28 PM

## 2024-04-02 NOTE — Group Note (Signed)
 Date:  04/02/2024 Time:  10:38 PM  Group Topic/Focus:  Making Healthy Choices:   The focus of this group is to help patients identify negative/unhealthy choices they were using prior to admission and identify positive/healthier coping strategies to replace them upon discharge. Self Care:   The focus of this group is to help patients understand the importance of self-care in order to improve or restore emotional, physical, spiritual, interpersonal, and financial health.    Participation Level:  Active  Participation Quality:  Appropriate and Attentive  Affect:  Appropriate  Cognitive:  Alert, Appropriate, and Oriented  Insight: Appropriate and Good  Engagement in Group:  Engaged  Modes of Intervention:  Discussion and Support  Additional Comments:  N/A  [name] 04/02/2024, 10:38 PM

## 2024-04-02 NOTE — Progress Notes (Signed)
" °   04/01/24 2200  Psych Admission Type (Psych Patients Only)  Admission Status Involuntary  Psychosocial Assessment  Patient Complaints None  Eye Contact Fair  Facial Expression Flat  Affect Appropriate to circumstance  Speech Logical/coherent  Interaction Assertive  Motor Activity Slow  Appearance/Hygiene Unremarkable  Behavior Characteristics Cooperative;Appropriate to situation  Mood Pleasant  Aggressive Behavior  Effect No apparent injury  Thought Process  Coherency WDL  Content WDL  Delusions None reported or observed  Perception WDL  Hallucination None reported or observed  Judgment WDL  Confusion None  Danger to Self  Current suicidal ideation? Denies  Agreement Not to Harm Self Yes  Description of Agreement Verbal  Danger to Others  Danger to Others None reported or observed   Pt refused scheduled night Suboxone  adding, I am not taking it because it makes me jittery and nausea; I feel like I am going to throw up. "

## 2024-04-02 NOTE — Progress Notes (Signed)
 [facility] MD Progress Note  04/02/2024 8:25 PM [name]  MRN:  [id number]   Subjective:  Chart reviewed, case discussed in multidisciplinary meeting, patient seen during rounds.   1/4: Patient found in his room during rounds. He remains discharge focused. Discussed that we would have to contact ACT team so that they can follow up with him upon discharge. He supplied clinical research associate with number two act team and on-call. He continues to deny SI, HI, AVH. He reports that he's feeling. Better after receiving his injection. He denies any other concerns. He has been present in the milieu and medication compliant.  1/3: Patient found in day room during rounds. Patient pleasant and cooperative on contact. Patient expresses that he is ready to go home. He reflects on the fact that he only came here due to needing his injection. He reports that he called his act team and they told him to call 911 and tell them he was suicidal. He reported that they said this is how he would get his injection. He reports he was unaware that it would get him admitted. He denies being suicidal.  He reports he lives at home with his girlfriend, but they have no transportation. He also has a dog named Psychiatrist. He identifies being on disability and engaged with the act team. He denies HI or AVH. He reports feeling better after receiving his injection. As per social work, note, his act team will follow up the day after he is discharged.    Past Psychiatric History: see h&P Family History:  Family History  Adopted: Yes  Problem Relation Age of Onset   Colon cancer Neg Hx    Esophageal cancer Neg Hx    Rectal cancer Neg Hx    Stomach cancer Neg Hx    Social History:  Social History   Substance and Sexual Activity  Alcohol  Use Not Currently   Alcohol /week: 3.0 standard drinks of alcohol    Types: 3 Cans of beer per week   Comment: quit 2015; formerly up to 2 fifths/day     Social History   Substance and Sexual Activity  Drug Use  Not Currently   Types: Marijuana, Heroin, Cocaine   Comment: Heroin and marijuana in 07/2020    Social History   Socioeconomic History   Marital status: Significant Other    Spouse name: Not on file   Number of children: Not on file   Years of education: Not on file   Highest education level: Not on file  Occupational History   Not on file  Tobacco Use   Smoking status: Heavy Smoker    Current packs/day: 1.50    Average packs/day: 1.5 packs/day for 38.0 years (57.0 ttl pk-yrs)    Types: Cigarettes   Smokeless tobacco: Never   Tobacco comments:    onset age 51; upto 1.5 ppd  Vaping Use   Vaping status: Never Used  Substance and Sexual Activity   Alcohol  use: Not Currently    Alcohol /week: 3.0 standard drinks of alcohol     Types: 3 Cans of beer per week    Comment: quit 2015; formerly up to 2 fifths/day   Drug use: Not Currently    Types: Marijuana, Heroin, Cocaine    Comment: Heroin and marijuana in 07/2020   Sexual activity: Yes    Comment: declined condoms  Other Topics Concern   Not on file  Social History Narrative   Not on file   Social Drivers of Health   Tobacco Use:  High Risk (03/27/2024)   Patient History    Smoking Tobacco Use: Heavy Smoker    Smokeless Tobacco Use: Never    Passive Exposure: Not on file  Financial Resource Strain: Not on file  Food Insecurity: No Food Insecurity (03/27/2024)   Epic    Worried About Programme Researcher, Broadcasting/film/video in the Last Year: Never true    Ran Out of Food in the Last Year: Never true  Transportation Needs: Unmet Transportation Needs (03/27/2024)   Epic    Lack of Transportation (Medical): Yes    Lack of Transportation (Non-Medical): No  Physical Activity: Not on file  Stress: Not on file  Social Connections: Not on file  Depression (EYV7-0): Not on file  Alcohol  Screen: Low Risk (03/27/2024)   Alcohol  Screen    Last Alcohol  Screening Score (AUDIT): 0  Housing: Unknown (03/27/2024)   Epic    Unable to Pay for Housing in  the Last Year: No    Number of Times Moved in the Last Year: Not on file    Homeless in the Last Year: No  Utilities: Not At Risk (03/27/2024)   Epic    Threatened with loss of utilities: No  Health Literacy: Not on file   Past Medical History:  Past Medical History:  Diagnosis Date   Ambulates with cane    straight cane   Anxiety    Arthritis    Asthma    Depression    Duodenal adenoma    GERD (gastroesophageal reflux disease)    Headache    Hepatitis    Hepatitis C -   Hypertension    Pneumonia    hx   Post-traumatic osteoarthritis of left ankle    Pre-diabetes    hgba1c on 09/2020 was 5.9   Presence of right artificial knee joint 12/17/2016   Schizophrenia (HCC) 07/2020   from [facility]   Stroke [facility])    2009-or10   Substance abuse (HCC) 07/2020   heroin abuse from [facility]   Type 2 diabetes mellitus (HCC) 07/09/2015    Past Surgical History:  Procedure Laterality Date   ANKLE ARTHROSCOPY Left 10/20/2017   Procedure: ANKLE ARTHROSCOPY;  Surgeon: Harden Jerona GAILS, MD;  Location[facility] OR;  Service: Orthopedics;  Laterality: Left;   ANKLE FUSION Left 10/20/2017   ANKLE FUSION Left 10/20/2017   Procedure: LEFT ANKLE FUSION;  Surgeon: Harden Jerona GAILS, MD;  Location: [facility] OR;  Service: Orthopedics;  Laterality: Left;   BACK SURGERY     ESOPHAGOGASTRODUODENOSCOPY Left 12/05/2013   Procedure: ESOPHAGOGASTRODUODENOSCOPY (EGD);  Surgeon: Elsie Cree, MD;  Location: [facility];  Service: Endoscopy;  Laterality: Left;   ESOPHAGOGASTRODUODENOSCOPY (EGD) WITH PROPOFOL  N/A 02/20/2015   Procedure: ESOPHAGOGASTRODUODENOSCOPY (EGD) WITH PROPOFOL ;  Surgeon: Jerrell Sol, MD;  Location: [facility];  Service: Endoscopy;  Laterality: N/A;   HARDWARE REMOVAL Left 01/29/2018   Procedure: HARDWARE REMOVAL LEFT ANKLE;  Surgeon: Harden Jerona GAILS, MD;  Location: [facility] OR;  Service: Orthopedics;  Laterality: Left;   IR LUMBAR DISC ASPIRATION W/IMG GUIDE  01/08/2020   JOINT REPLACEMENT     THORACIC  LAMINECTOMY FOR EPIDURAL ABSCESS N/A 11/03/2020   Procedure: LUMBAR THREE AND LUMBAR  FOUR LAMINECTOMY FOR EVACUATION EPIDURAL ABSCESS;  Surgeon: Cheryle Debby LABOR, MD;  Location: [facility] OR;  Service: Neurosurgery;  Laterality: N/A;   TOOTH EXTRACTION N/A 01/01/2023   Procedure: EXTRACTION TEETH NUMBER EIGHTEEN,NINETEEN,TWENTY,TWENTY-TWO,TWENTY-SEVEN,TWENTY-EIGHT,TWENTY-NINE,THIRTY-ONE, ALVEOLOPLASTY, REDUCTION HYPERPLASTIC LEFT MAXILLARY OSSEOUS TUBEROSITY, REMOVAL BILATERAL MANDIBULAR LINGUAL TORI;  Surgeon: Sheryle Hamilton, DMD;  Location: [facility]  OR;  Service: Oral Surgery;  Laterality: N/A;   TOTAL KNEE ARTHROPLASTY Right 07/21/2016   Procedure: TOTAL KNEE ARTHROPLASTY;  Surgeon: Glendia Cordella Hutchinson, MD;  Location: [facility] OR;  Service: Orthopedics;  Laterality: Right;   UPPER GASTROINTESTINAL ENDOSCOPY      Current Medications: Current Facility-Administered Medications  Medication Dose Route Frequency Provider Last Rate Last Admin   acetaminophen  (TYLENOL ) tablet 650 mg  650 mg Oral Q6H PRN Motley-Mangrum, Jadeka A, PMHNP   650 mg at 04/01/24 2059   alum & mag hydroxide-simeth (MAALOX/MYLANTA) 200-200-20 MG/5ML suspension 30 mL  30 mL Oral Q4H PRN Motley-Mangrum, Jadeka A, PMHNP   30 mL at 03/28/24 1250   amLODipine  (NORVASC ) tablet 10 mg  10 mg Oral Daily Awanda City, MD   10 mg at 04/02/24 1104   buprenorphine -naloxone  (SUBOXONE ) 8-2 mg per SL tablet 1 tablet  1 tablet Sublingual QHS Madaram, Kondal R, MD   1 tablet at 03/30/24 2104   haloperidol  (HALDOL ) tablet 5 mg  5 mg Oral TID PRN Motley-Mangrum, Jadeka A, PMHNP   5 mg at 03/29/24 0010   And   diphenhydrAMINE  (BENADRYL ) capsule 50 mg  50 mg Oral TID PRN Motley-Mangrum, Jadeka A, PMHNP   50 mg at 03/31/24 2115   haloperidol  lactate (HALDOL ) injection 5 mg  5 mg Intramuscular TID PRN Motley-Mangrum, Jadeka A, PMHNP       And   diphenhydrAMINE  (BENADRYL ) injection 50 mg  50 mg Intramuscular TID PRN Motley-Mangrum, Jadeka A, PMHNP       And    LORazepam  (ATIVAN ) injection 2 mg  2 mg Intramuscular TID PRN Motley-Mangrum, Jadeka A, PMHNP       haloperidol  lactate (HALDOL ) injection 10 mg  10 mg Intramuscular TID PRN Motley-Mangrum, Jadeka A, PMHNP       And   diphenhydrAMINE  (BENADRYL ) injection 50 mg  50 mg Intramuscular TID PRN Motley-Mangrum, Jadeka A, PMHNP       And   LORazepam  (ATIVAN ) injection 2 mg  2 mg Intramuscular TID PRN Motley-Mangrum, Jadeka A, PMHNP   2 mg at 03/29/24 0237   famotidine  (PEPCID ) tablet 20 mg  20 mg Oral BID Motley-Mangrum, Jadeka A, PMHNP   20 mg at 04/02/24 1755   hydrALAZINE  (APRESOLINE ) injection 10 mg  10 mg Intravenous Q6H PRN Awanda City, MD       hydrOXYzine  (ATARAX ) tablet 50 mg  50 mg Oral TID PRN Shahla Betsill, NP   50 mg at 04/02/24 1357   magnesium  hydroxide (MILK OF MAGNESIA) suspension 30 mL  30 mL Oral Daily PRN Motley-Mangrum, Jadeka A, PMHNP       metoprolol  succinate (TOPROL -XL) 24 hr tablet 200 mg  200 mg Oral Daily Awanda City, MD   200 mg at 04/02/24 1103   nicotine  (NICODERM CQ  - dosed in mg/24 hours) patch 14 mg  14 mg Transdermal Daily Jadapalle, Sree, MD   14 mg at 04/02/24 9092   ondansetron  (ZOFRAN ) tablet 4 mg  4 mg Oral TID PRN Shrivastava, Aryendra, MD   4 mg at 03/28/24 2023    Lab Results:  Results for orders placed or performed during the hospital encounter of 03/27/24 (from the past 48 hours)  Hemoglobin A1c     Status: None   Collection Time: 04/02/24  7:55 AM  Result Value Ref Range   Hgb A1c MFr Bld 5.5 4.8 - 5.6 %    Comment: (NOTE) Diagnosis of Diabetes The following HbA1c ranges recommended by the American Diabetes Association (ADA)  Arthea Nobel be used as an aid in the diagnosis of diabetes mellitus.  Hemoglobin             Suggested A1C NGSP%              Diagnosis  <5.7                   Non Diabetic  5.7-6.4                Pre-Diabetic  >6.4                   Diabetic  <7.0                   Glycemic control for                       adults with diabetes.      Mean Plasma Glucose 111.15 mg/dL    Comment: Performed at [facility], [address], [place], [place] [address]  Lipid panel     Status: Abnormal   Collection Time: 04/02/24  7:55 AM  Result Value Ref Range   Cholesterol 112 0 - 200 mg/dL    Comment:        ATP III CLASSIFICATION:  <200     mg/dL   Desirable  799-760  mg/dL   Borderline High  >=759    mg/dL   High           Triglycerides 91 <150 mg/dL   HDL 32 (L) >59 mg/dL   Total CHOL/HDL Ratio 3.5 RATIO   VLDL 18 0 - 40 mg/dL   LDL Cholesterol 62 0 - 99 mg/dL    Comment:        Total Cholesterol/HDL:CHD Risk Coronary Heart Disease Risk Table                     Men   Women  1/2 Average Risk   3.4   3.3  Average Risk       5.0   4.4  2 X Average Risk   9.6   7.1  3 X Average Risk  23.4   11.0        Use the calculated Patient Ratio above and the CHD Risk Table to determine the patient's CHD Risk.        ATP III CLASSIFICATION (LDL):  <100     mg/dL   Optimal  899-870  mg/dL   Near or Above                    Optimal  130-159  mg/dL   Borderline  839-810  mg/dL   High  >809     mg/dL   Very High Performed at [facility], [address] [place], [place] [address]   TSH     Status: None   Collection Time: 04/02/24  7:55 AM  Result Value Ref Range   TSH 1.640 0.350 - 4.500 uIU/mL    Comment: Performed at [facility], [address] [place], [place] [address]    Blood Alcohol  level:  Lab Results  Component Value Date   [facility] <15 03/27/2024   ETH <10 07/25/2019    Metabolic Disorder Labs: Lab Results  Component Value Date   HGBA1C 5.5 04/02/2024   MPG 111.15 04/02/2024   MPG 122.63 11/03/2020   No results found for: PROLACTIN Lab Results  Component  Value Date   CHOL 112 04/02/2024   TRIG 91 04/02/2024   HDL 32 (L) 04/02/2024   CHOLHDL 3.5 04/02/2024   VLDL 18 04/02/2024   LDLCALC 62 04/02/2024   LDLCALC 95 03/02/2014    Physical Findings: AIMS:  , ,  ,  ,    CIWA:   CIWA-Ar Total: 0 COWS:  COWS Total Score: 0   Psychiatric Specialty Exam:  Presentation  General Appearance:  Casual  Eye Contact: Fair  Speech: Clear and Coherent; Normal Rate  Speech Volume: Normal    Mood and Affect  Mood: Euthymic  Affect: Appropriate; Congruent   Thought Process  Thought Processes: Goal Directed; Linear; Coherent  Orientation:Full (Time, Place and Person)  Thought Content:WDL  Hallucinations:Hallucinations: None  Ideas of Reference:None  Suicidal Thoughts:Suicidal Thoughts: No  Homicidal Thoughts:Homicidal Thoughts: No   Sensorium  Memory: Immediate Fair; Recent Fair; Remote Fair  Judgment: Fair  Insight: Fair   Art Therapist  Concentration: Fair  Attention Span: Fair  Recall: [employer] of Knowledge: Fair  Language: Fair   Psychomotor Activity  Psychomotor Activity: Psychomotor Activity: Normal  Musculoskeletal: Strength & Muscle Tone: within normal limits Gait & Station: normal Assets  Assets: Desire for Improvement; Housing; Health And Safety Inspector; Intimacy; Social Support; Manufacturing Systems Engineer    Physical Exam: Physical Exam Vitals and nursing note reviewed.  Constitutional:      Appearance: Normal appearance.  Pulmonary:     Effort: Pulmonary effort is normal.  Neurological:     Mental Status: He is alert and oriented to person, place, and time.  Psychiatric:        Mood and Affect: Mood normal.        Behavior: Behavior normal.    Review of Systems  Respiratory:  Negative for shortness of breath.   Cardiovascular:  Negative for chest pain.  Gastrointestinal:  Negative for diarrhea, nausea and vomiting.  Psychiatric/Behavioral:  Negative for depression, hallucinations and suicidal ideas. The patient is not nervous/anxious.   All other systems reviewed and are negative.  Blood pressure (!) 133/96, pulse 85, temperature 98 F (36.7 C), temperature source Oral, resp. rate  18, height 5' 7 (1.702 m), weight 83.5 kg, SpO2 98%. Body mass index is 28.82 kg/m.  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 Psychiatric Treatment:  Scheduled Medications: Invega  injection Monthly  Nicotine  patch Suboxone  nightly    -- The risks/benefits/side-effects/alternatives to this medication were discussed in detail with the patient and time was given for questions. The patient consents to medication trial.   3. Medical Issues Being Addressed:  Hospitalist consulted due to ongoing elevated blood pressure. Amlodipine  10 mg Pepcid  Metoprolol  200 mg 24 hour    4. Discharge Planning:   -- Social work and case management to assist with discharge planning and identification of hospital follow-up needs prior to discharge  -- Estimated LOS: 5-7 days  Likely discharge Monday/Early next week after coordination with Act Team - attempted to coordinate over the weekend.   Neven Fina, NP 04/02/2024, 8:25 PM

## 2024-04-02 NOTE — Plan of Care (Signed)
   Problem: Education: Goal: Knowledge of [name] General Education information/materials will improve Outcome: Progressing Goal: Emotional status will improve Outcome: Progressing Goal: Mental status will improve Outcome: Progressing Goal: Verbalization of understanding the information provided will improve Outcome: Progressing   Problem: Activity: Goal: Interest or engagement in activities will improve Outcome: Progressing Goal: Sleeping patterns will improve Outcome: Progressing   Problem: Coping: Goal: Ability to verbalize frustrations and anger appropriately will improve Outcome: Progressing Goal: Ability to demonstrate self-control will improve Outcome: Progressing

## 2024-04-02 NOTE — BHH Counselor (Signed)
 CSW contacted pt's ACTT Team ([facility]) per provider's request.  CSW unable to reach, left HIPAA compliant VM requesting return call.   Lum Croft, MSW, [place] 04/02/2024 1:03 PM

## 2024-04-02 NOTE — Progress Notes (Signed)
" °   04/02/24 1100  Psych Admission Type (Psych Patients Only)  Admission Status Involuntary  Psychosocial Assessment  Patient Complaints None  Eye Contact Fair  Facial Expression Flat  Affect Appropriate to circumstance  Speech Logical/coherent  Interaction Assertive  Motor Activity Slow  Appearance/Hygiene Unremarkable  Behavior Characteristics Cooperative;Appropriate to situation  Mood Pleasant  Thought Process  Coherency WDL  Content WDL  Delusions None reported or observed  Perception WDL  Hallucination None reported or observed  Judgment WDL  Confusion None  Danger to Self  Current suicidal ideation? Denies  Agreement Not to Harm Self Yes  Description of Agreement verbal  Danger to Others  Danger to Others None reported or observed    "

## 2024-04-02 NOTE — BHH Counselor (Signed)
 CSW spoke to pt's ACT team per provider's request. Pt's ACT team reports that once pt gets home pt's wife will call and they will come out to see pt.   CSW informed care team and provider

## 2024-04-02 NOTE — Plan of Care (Signed)
   Problem: Education: Goal: Emotional status will improve Outcome: Progressing Goal: Mental status will improve Outcome: Progressing

## 2024-04-03 MED ORDER — AMLODIPINE BESYLATE 10 MG PO TABS: 10.0000 mg | ORAL_TABLET | Freq: Every day | ORAL | 0 refills | Status: AC

## 2024-04-03 MED ORDER — METOPROLOL SUCCINATE ER 200 MG PO TB24: 200.0000 mg | ORAL_TABLET | Freq: Every day | ORAL | 9 refills | Status: AC

## 2024-04-03 MED ORDER — BUPRENORPHINE HCL-NALOXONE HCL 8-2 MG SL SUBL: 1.0000 | SUBLINGUAL_TABLET | Freq: Every day | SUBLINGUAL | 0 refills | Status: AC

## 2024-04-03 MED ORDER — NICOTINE 14 MG/24HR TD PT24: 14.0000 mg | MEDICATED_PATCH | Freq: Every day | TRANSDERMAL | 0 refills | Status: AC

## 2024-04-03 NOTE — BHH Suicide Risk Assessment (Signed)
 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)   Opioid use, unspecified with withdrawal (HCC)   Total Time spent with patient: 30 minutes  Musculoskeletal: Strength & Muscle Tone: within normal limits Gait & Station: normal Patient leans: N/A  Psychiatric Specialty Exam  Presentation  General Appearance:  Appropriate for Environment  Eye Contact: Fair  Speech: Clear and Coherent  Speech Volume: Normal  Handedness: Right   Mood and Affect  Mood: Euthymic  Duration of Depression Symptoms: Greater than two weeks  Affect: Appropriate   Thought Process  Thought Processes: Coherent  Descriptions of Associations:Intact  Orientation:Full (Time, Place and Person)  Thought Content:Logical  History of Schizophrenia/Schizoaffective disorder:Yes  Duration of Psychotic Symptoms:Greater than six months  Hallucinations:Hallucinations: None  Ideas of Reference:None  Suicidal Thoughts:Suicidal Thoughts: No  Homicidal Thoughts:Homicidal Thoughts: No   Sensorium  Memory: Immediate Fair  Judgment: Fair  Insight: Fair   Art Therapist  Concentration: Fair  Attention Span: Fair  Recall: [employer] of Knowledge: Fair  Language: Fair   Psychomotor Activity  Psychomotor Activity: Psychomotor Activity: Normal   Assets  Assets: Communication Skills; Desire for Improvement   Sleep  Sleep: Sleep: Fair  Estimated Sleeping Duration (Last 24 Hours): 10.00-10.75 hours  Physical Exam: Physical Exam ROS Blood pressure (!) 117/92, pulse (!) 101, temperature 98 F (36.7 C), temperature source Oral, resp. rate 18, height 5' 7 (1.702 m), weight 83.5 kg, SpO2 99%. Body mass index is 28.82  kg/m.  Mental Status Per Nursing Assessment::   On Admission:  Suicidal ideation indicated by others  Demographic Factors:  Male  Loss Factors: NA  Historical Factors: Impulsivity  Risk Reduction Factors:   Living with another person, especially a relative, Positive social support, Positive therapeutic relationship, and Positive coping skills or problem solving skills  Continued Clinical Symptoms:  Depression:   Impulsivity  Cognitive Features That Contribute To Risk:  None    Suicide Risk:  Minimal: No identifiable suicidal ideation.  Patients presenting with no risk factors but with morbid ruminations; may be classified as minimal risk based on the severity of the depressive symptoms   Follow-up Information     [facility] Follow up.   Why: Your ACTT services will resume on Tuesday, 04/04/2024. Call them directly to schedule follow up. Contact information: [address] [place] [address] [phone]                 Plan Of Care/Follow-up recommendations:  Activity:  as tolerated  Allyn Foil, MD 04/03/2024, 1:09 PM

## 2024-04-03 NOTE — Group Note (Signed)
 LCSW Group Therapy Note  Group Date: 04/03/2024 Start Time: 1315 End Time: 1400   Type of Therapy and Topic:  Group Therapy: Anger Cues and Responses  Participation Level:  Did Not Attend   Description of Group:   In this group, patients learned how to recognize the physical, cognitive, emotional, and behavioral responses they have to anger-provoking situations.  They identified a recent time they became angry and how they reacted.  They analyzed how their reaction was possibly beneficial and how it was possibly unhelpful.  The group discussed a variety of healthier coping skills that could help with such a situation in the future.  Focus was placed on how helpful it is to recognize the underlying emotions to our anger, because working on those can lead to a more permanent solution as well as our ability to focus on the important rather than the urgent.  Therapeutic Goals: Patients will remember their last incident of anger and how they felt emotionally and physically, what their thoughts were at the time, and how they behaved. Patients will identify how their behavior at that time worked for them, as well as how it worked against them. Patients will explore possible new behaviors to use in future anger situations. Patients will learn that anger itself is normal and cannot be eliminated, and that healthier reactions can assist with resolving conflict rather than worsening situations.  Summary of Patient Progress:  X Therapeutic Modalities:   Cognitive Behavioral Therapy    Sherryle JINNY Margo, LCSW 04/03/2024  2:43 PM

## 2024-04-03 NOTE — Progress Notes (Signed)
" °  [facility] Adult Case Management Discharge Plan :  Will you be returning to the same living situation after discharge:  Yes,  pt plans to return home upon discharge.  At discharge, do you have transportation home?: Yes,  pt received taxi voucher.  Do you have the ability to pay for your medications: Yes,  UNITED [employer] MEDICARE / UNITEDHE[employer] DUAL COMPLETE.  Release of information consent forms completed and in the chart;  Patient's signature needed at discharge.  Patient to Follow up at:  Follow-up Information     [facility] Follow up.   Why: Your ACTT services will resume on Tuesday, 04/04/2024. Call them directly to schedule follow up. Contact information: [address] [place] [address] [phone]                 Next level of care provider has access to [facility] Link:no  Safety Planning and Suicide Prevention discussed: Yes,  SPE completed with girlfriend, Niels Luna.     Has patient been referred to the Quitline?: Yes, faxed/e-referral on 03/31/2024.  Patient has been referred for addiction treatment: Patient refused referral for treatment.  Nadara JONELLE Fam, LCSW 04/03/2024, 11:10 AM "

## 2024-04-03 NOTE — Discharge Summary (Signed)
 " Physician Discharge Summary Note  Patient:  [name] is an 59 y.o., male MRN:  [id number] DOB:  03-Jan-1966 Patient phone:  [phone] (home)  Patient address:   [address] [place] [address],   Total time spent: 40 min Date of Admission:  03/27/2024 Date of Discharge: 04/03/24  Reason for Admission:  Mr. Biehn is a 59 year old male with a known history of schizophrenia and polysubstance use who presented voluntarily to the [facility] Emergency Department reporting a mental breakdown over the past several weeks. On initial evaluation, he endorsed suicidal ideation with a stated plan to shoot himself and reported auditory hallucinations instructing him to harm himself and others. He denied a specific homicidal plan and denied access to weapons. Patient is admitted to adult psych unit with Q15 min safety monitoring. Multidisciplinary team approach is offered. Medication management; group/milieu therapy is offered.   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 (HCC)   Opioid use, unspecified with withdrawal (HCC)   Past Psychiatric History: see h&p  Family Psychiatric  History: see h&p Social History:  Social History   Substance and Sexual Activity  Alcohol  Use Not Currently   Alcohol /week: 3.0 standard drinks of alcohol    Types: 3 Cans of beer per week   Comment: quit 2015; formerly up to 2 fifths/day     Social History   Substance and Sexual Activity  Drug Use Not Currently   Types: Marijuana, Heroin, Cocaine   Comment: Heroin and marijuana in 07/2020    Social History   Socioeconomic History   Marital status: Significant Other    Spouse name: Not on file   Number of children: Not on file   Years of education: Not on file   Highest education level: Not on  file  Occupational History   Not on file  Tobacco Use   Smoking status: Heavy Smoker    Current packs/day: 1.50    Average packs/day: 1.5 packs/day for 38.0 years (57.0 ttl pk-yrs)    Types: Cigarettes   Smokeless tobacco: Never   Tobacco comments:    onset age 35; upto 1.5 ppd  Vaping Use   Vaping status: Never Used  Substance and Sexual Activity   Alcohol  use: Not Currently    Alcohol /week: 3.0 standard drinks of alcohol     Types: 3 Cans of beer per week    Comment: quit 2015; formerly up to 2 fifths/day   Drug use: Not Currently    Types: Marijuana, Heroin, Cocaine    Comment: Heroin and marijuana in 07/2020   Sexual activity: Yes    Comment: declined condoms  Other Topics Concern   Not on file  Social History Narrative   Not on file   Social Drivers of Health   Tobacco Use: High Risk (03/27/2024)   Patient History    Smoking Tobacco Use: Heavy Smoker    Smokeless Tobacco Use: Never    Passive Exposure: Not on file  Financial Resource Strain: Not on file  Food Insecurity: No Food Insecurity (03/27/2024)   Epic    Worried About Radiation Protection Practitioner of Food in the Last Year: Never true    [employer] of Food in the Last Year: Never true  Transportation Needs: Unmet Transportation Needs (03/27/2024)   Epic    Lack of Transportation (Medical): Yes    Lack of Transportation (Non-Medical): No  Physical Activity:  Not on file  Stress: Not on file  Social Connections: Not on file  Depression (EYV7-0): Not on file  Alcohol  Screen: Low Risk (03/27/2024)   Alcohol  Screen    Last Alcohol  Screening Score (AUDIT): 0  Housing: Unknown (03/27/2024)   Epic    Unable to Pay for Housing in the Last Year: No    Number of Times Moved in the Last Year: Not on file    Homeless in the Last Year: No  Utilities: Not At Risk (03/27/2024)   Epic    Threatened with loss of utilities: No  Health Literacy: Not on file   Past Medical History:  Past Medical History:  Diagnosis Date   Ambulates  with cane    straight cane   Anxiety    Arthritis    Asthma    Depression    Duodenal adenoma    GERD (gastroesophageal reflux disease)    Headache    Hepatitis    Hepatitis C -   Hypertension    Pneumonia    hx   Post-traumatic osteoarthritis of left ankle    Pre-diabetes    hgba1c on 09/2020 was 5.9   Presence of right artificial knee joint 12/17/2016   Schizophrenia (HCC) 07/2020   from [facility]   Stroke [facility])    2009-or10   Substance abuse (HCC) 07/2020   heroin abuse from [facility]   Type 2 diabetes mellitus (HCC) 07/09/2015    Past Surgical History:  Procedure Laterality Date   ANKLE ARTHROSCOPY Left 10/20/2017   Procedure: ANKLE ARTHROSCOPY;  Surgeon: Harden Jerona GAILS, MD;  Location: [facility] OR;  Service: Orthopedics;  Laterality: Left;   ANKLE FUSION Left 10/20/2017   ANKLE FUSION Left 10/20/2017   Procedure: LEFT ANKLE FUSION;  Surgeon: Harden Jerona GAILS, MD;  Location: [facility] OR;  Service: Orthopedics;  Laterality: Left;   BACK SURGERY     ESOPHAGOGASTRODUODENOSCOPY Left 12/05/2013   Procedure: ESOPHAGOGASTRODUODENOSCOPY (EGD);  Surgeon: Elsie Cree, MD;  Location: [facility];  Service: Endoscopy;  Laterality: Left;   ESOPHAGOGASTRODUODENOSCOPY (EGD) WITH PROPOFOL  N/A 02/20/2015   Procedure: ESOPHAGOGASTRODUODENOSCOPY (EGD) WITH PROPOFOL ;  Surgeon: Jerrell Sol, MD;  Location: [facility];  Service: Endoscopy;  Laterality: N/A;   HARDWARE REMOVAL Left 01/29/2018   Procedure: HARDWARE REMOVAL LEFT ANKLE;  Surgeon: Harden Jerona GAILS, MD;  Location: [facility] OR;  Service: Orthopedics;  Laterality: Left;   IR LUMBAR DISC ASPIRATION W/IMG GUIDE  01/08/2020   JOINT REPLACEMENT     THORACIC LAMINECTOMY FOR EPIDURAL ABSCESS N/A 11/03/2020   Procedure: LUMBAR THREE AND LUMBAR  FOUR LAMINECTOMY FOR EVACUATION EPIDURAL ABSCESS;  Surgeon: Cheryle Debby LABOR, MD;  Location: [facility] OR;  Service: Neurosurgery;  Laterality: N/A;   TOOTH EXTRACTION N/A 01/01/2023   Procedure: EXTRACTION TEETH NUMBER  EIGHTEEN,NINETEEN,TWENTY,TWENTY-TWO,TWENTY-SEVEN,TWENTY-EIGHT,TWENTY-NINE,THIRTY-ONE, ALVEOLOPLASTY, REDUCTION HYPERPLASTIC LEFT MAXILLARY OSSEOUS TUBEROSITY, REMOVAL BILATERAL MANDIBULAR LINGUAL TORI;  Surgeon: Sheryle Hamilton, DMD;  Location: [facility] OR;  Service: Oral Surgery;  Laterality: N/A;   TOTAL KNEE ARTHROPLASTY Right 07/21/2016   Procedure: TOTAL KNEE ARTHROPLASTY;  Surgeon: Hamilton Cordella Hutchinson, MD;  Location: [facility] OR;  Service: Orthopedics;  Laterality: Right;   UPPER GASTROINTESTINAL ENDOSCOPY     Family History:  Family History  Adopted: Yes  Problem Relation Age of Onset   Colon cancer Neg Hx    Esophageal cancer Neg Hx    Rectal cancer Neg Hx    Stomach cancer Neg Hx     Hospital Course:   Mr. Supinski is a 59 year old male with a known  history of schizophrenia and polysubstance use who presented voluntarily to the [facility] Emergency Department reporting a mental breakdown over the past several weeks. On initial evaluation, he endorsed suicidal ideation with a stated plan to shoot himself and reported auditory hallucinations instructing him to harm himself and others. He denied a specific homicidal plan and denied access to weapons. Patient is admitted to adult psych unit with Q15 min safety monitoring. Multidisciplinary team approach is offered. Medication management; group/milieu therapy is offered.   On admission,  Detailed risk assessment is complete based on clinical exam and individual risk factors and acute suicide risk is low and acute violence risk is low.    On the day of discharge, patient denies SI/HI/plan and denies hallucinations.  Patient remains future oriented and is willing to participate in outpatient mental health services.  Currently, all modifiable risk of harm to self/harm to others have been addressed and patient is no longer appropriate for the acute inpatient setting and is able to continue treatment for mental health needs in the community with the supports  as indicated below.  Patient is educated and verbalized understanding of discharge plan of care including medications, follow-up appointments, mental health resources and further crisis services in the community.  He is instructed to call 911 or present to the nearest emergency room should he experience any decompensation in mood, disturbance of bowel or return of suicidal/homicidal ideations.  Patient verbalizes understanding of this education and agrees to this plan of care  Physical Findings: AIMS:  , ,  ,  ,    CIWA:  CIWA-Ar Total: 0 COWS:  COWS Total Score: 0   Psychiatric Specialty Exam:  Presentation  General Appearance:  Appropriate for Environment  Eye Contact: Fair  Speech: Clear and Coherent  Speech Volume: Normal    Mood and Affect  Mood: Euthymic  Affect: Appropriate   Thought Process  Thought Processes: Coherent  Descriptions of Associations:Intact  Orientation:Full (Time, Place and Person)  Thought Content:Logical  Hallucinations:Hallucinations: None  Ideas of Reference:None  Suicidal Thoughts:Suicidal Thoughts: No  Homicidal Thoughts:Homicidal Thoughts: No   Sensorium  Memory: Immediate Fair  Judgment: Fair  Insight: Fair   Art Therapist  Concentration: Fair  Attention Span: Fair  Recall: [employer] of Knowledge: Fair  Language: Fair   Psychomotor Activity  Psychomotor Activity: Psychomotor Activity: Normal  Musculoskeletal: Strength & Muscle Tone: {desc; muscle tone:32375} Gait & Station: {PE GAIT ED WJUO:77474} Assets  Assets: Communication Skills; Desire for Improvement   Sleep  Sleep: Sleep: Fair    Physical Exam: Physical Exam ROS Blood pressure (!) 117/92, pulse (!) 101, temperature 98 F (36.7 C), temperature source Oral, resp. rate 18, height 5' 7 (1.702 m), weight 83.5 kg, SpO2 99%. Body mass index is 28.82 kg/m.   Tobacco Use History[1] Tobacco Cessation:  {Discharge tobacco  cessation prescription:304700209}   Blood Alcohol  level:  Lab Results  Component Value Date   [facility] <15 03/27/2024   ETH <10 07/25/2019    Metabolic Disorder Labs:  Lab Results  Component Value Date   HGBA1C 5.5 04/02/2024   MPG 111.15 04/02/2024   MPG 122.63 11/03/2020   No results found for: PROLACTIN Lab Results  Component Value Date   CHOL 112 04/02/2024   TRIG 91 04/02/2024   HDL 32 (L) 04/02/2024   CHOLHDL 3.5 04/02/2024   VLDL 18 04/02/2024   LDLCALC 62 04/02/2024   LDLCALC 95 03/02/2014    See Psychiatric Specialty Exam and Suicide Risk Assessment completed  by Attending Physician prior to discharge.  Discharge destination:  Home  Is patient on multiple antipsychotic therapies at discharge:  No   Has Patient had three or more failed trials of antipsychotic monotherapy by history:  No  Recommended Plan for Multiple Antipsychotic Therapies: NA   Allergies as of 04/03/2024       Reactions   Levofloxacin  Hives, Other (See Comments)   Confusion, hives, and itching (patient has tolerated ciprofloxacin )   Chlorhexidine  Itching, Rash        Medication List     STOP taking these medications    albuterol  108 (90 Base) MCG/ACT inhaler Commonly known as: VENTOLIN  HFA   fluticasone  50 MCG/ACT nasal spray Commonly known as: FLONASE    ibuprofen  800 MG tablet Commonly known as: ADVIL    ondansetron  4 MG tablet Commonly known as: ZOFRAN    SUMAtriptan 50 MG tablet Commonly known as: IMITREX       TAKE these medications      Indication  acetaminophen  500 MG tablet Commonly known as: TYLENOL  Take 500 mg by mouth every 8 (eight) hours as needed for moderate pain or mild pain.  Indication: Pain   amLODipine  10 MG tablet Commonly known as: NORVASC  Take 1 tablet (10 mg total) by mouth daily. Start taking on: April 04, 2024  Indication: High Blood Pressure   buprenorphine -naloxone  8-2 mg Subl SL tablet Commonly known as: SUBOXONE  Place 1 tablet  under the tongue at bedtime.  Indication: Opioid Dependence   famotidine  20 MG tablet Commonly known as: PEPCID  Take 20 mg by mouth 2 (two) times daily.  Indication: Ulcer of the Duodenum   Invega  Trinza 819 MG/2.63ML injection Generic drug: paliperidone  Palmitate ER Inject 819 mg into the muscle every 30 (thirty) days.  Indication: Schizophrenia   metoprolol  200 MG 24 hr tablet Commonly known as: TOPROL -XL Take 1 tablet (200 mg total) by mouth daily. Take with or immediately following a meal. Start taking on: April 04, 2024 What changed:  medication strength how much to take  Indication: High Blood Pressure   nicotine  14 mg/24hr patch Commonly known as: NICODERM CQ  - dosed in mg/24 hours Place 1 patch (14 mg total) onto the skin daily. Start taking on: April 04, 2024  Indication: Nicotine  Addiction        Follow-up Information     [facility] Follow up.   Why: Your ACTT services will resume on Tuesday, 04/04/2024. Call them directly to schedule follow up. Contact information: [address] [place] [address] [phone]                 Follow-up recommendations:  Activity:  as tolerated    Signed: Allyn Foil, MD 04/03/2024, 1:09 PM          [1]  Social History Tobacco Use  Smoking Status Heavy Smoker   Current packs/day: 1.50   Average packs/day: 1.5 packs/day for 38.0 years (57.0 ttl pk-yrs)   Types: Cigarettes  Smokeless Tobacco Never  Tobacco Comments   onset age 22; upto 1.5 ppd   "

## 2024-04-03 NOTE — Care Management Important Message (Signed)
 Important Message  Patient Details  Name: [name] MRN: [id number] Date of Birth: 1965/07/01   Medicare Important Message Given:  Yes - Medicare IM     Nadara JONELLE Fam, LCSW 04/03/2024, 11:29 AM

## 2024-04-03 NOTE — Progress Notes (Signed)
 Patient ID: [name], male   DOB: 02-Feb-1966, 59 y.o.   MRN: [id number]  1550 Pt D/C OOF with all of his belongings and D/C orders with paperwork. Pt ambulated using a walker. Transportation/Taxi is taking Pt to: [address] [place], [place] [address]. Pt denies SI/HI; A/V/H. No noted distress/discomfort.

## 2024-04-03 NOTE — Plan of Care (Signed)
?  Problem: Education: ?Goal: Knowledge of [facility] Education information/materials will improve ?Outcome: Progressing ?Goal: Emotional status will improve ?Outcome: Progressing ?Goal: Mental status will improve ?Outcome: Progressing ?Goal: Verbalization of understanding the information provided will improve ?Outcome: Progressing ?  ?Problem: Activity: ?Goal: Interest or engagement in activities will improve ?Outcome: Progressing ?Goal: Sleeping patterns will improve ?Outcome: Progressing ?  ?Problem: Coping: ?Goal: Ability to verbalize frustrations and anger appropriately will improve ?Outcome: Progressing ?Goal: Ability to demonstrate self-control will improve ?Outcome: Progressing ?  ?Problem: Physical Regulation: ?Goal: Ability to maintain clinical measurements within normal limits will improve ?Outcome: Progressing ?  ?Problem: Health Behavior/Discharge Planning: ?Goal: Identification of resources available to assist in meeting health care needs will improve ?Outcome: Progressing ?Goal: Compliance with treatment plan for underlying cause of condition will improve ?Outcome: Progressing ?  ?Problem: Safety: ?Goal: Periods of time without injury will increase ?Outcome: Progressing ?  ?

## 2024-04-03 NOTE — Progress Notes (Signed)
" °   04/02/24 2100  Psych Admission Type (Psych Patients Only)  Admission Status Involuntary  Psychosocial Assessment  Patient Complaints None  Eye Contact Fair  Facial Expression Flat  Affect Appropriate to circumstance  Speech Logical/coherent  Interaction Assertive  Motor Activity Slow  Appearance/Hygiene Unremarkable  Behavior Characteristics Cooperative;Appropriate to situation  Mood Pleasant  Aggressive Behavior  Effect No apparent injury  Thought Process  Coherency WDL  Content WDL  Delusions None reported or observed  Perception WDL  Hallucination None reported or observed  Judgment WDL  Confusion None  Danger to Self  Current suicidal ideation? Denies  Agreement Not to Harm Self Yes  Description of Agreement Verba  Danger to Others  Danger to Others None reported or observed    "

## 2024-04-03 NOTE — Group Note (Signed)
 Recreation Therapy Group Note   Group Topic:Coping Skills  Group Date: 04/03/2024 Start Time: 1050 End Time: 1135 Facilitators: Celestia Jeoffrey BRAVO, LRT, CTRS Location: Craft Room  Group Description: Mind Map.  Patient was provided a blank template of a diagram with 32 blank boxes in a tiered system, branching from the center (similar to a bubble chart). LRT directed patients to label the middle of the diagram Coping Skills. LRT and patients then came up with 8 different coping skills as examples. Pt were directed to record their coping skills in the 2nd tier boxes closest to the center.  Patients would then share their coping skills with the group as LRT wrote them out. LRT gave a handout of 99 different coping skills at the end of group.   Goal Area(s) Addressed: Patients will be able to define coping skills. Patient will identify new coping skills.  Patient will increase communication.   Affect/Mood: N/A   Participation Level: Did not attend    Clinical Observations/Individualized Feedback: Patient did not attend.  Plan: Continue to engage patient in RT group sessions 2-3x/week.   Jeoffrey BRAVO Celestia, LRT, CTRS 04/03/2024 12:43 PM

## 2024-04-03 NOTE — Group Note (Deleted)
 Date:  04/03/2024 Time:  12:45 PM  Group Topic/Focus:  Building Self Esteem:   The Focus of this group is helping patients become aware of the effects of self-esteem on their lives, the things they and others do that enhance or undermine their self-esteem, seeing the relationship between their level of self-esteem and the choices they make and learning ways to enhance self-esteem.     Participation Level:  {BHH PARTICIPATION OZCZO:77735}  Participation Quality:  {BHH PARTICIPATION QUALITY:22265}  Affect:  {BHH AFFECT:22266}  Cognitive:  {BHH COGNITIVE:22267}  Insight: {BHH Insight2:20797}  Engagement in Group:  {BHH ENGAGEMENT IN HMNLE:77731}  Modes of Intervention:  {BHH MODES OF INTERVENTION:22269}  Additional Comments:  ***  [name] 04/03/2024, 12:45 PM

## 2024-04-03 NOTE — Plan of Care (Signed)
